# Patient Record
Sex: Male | Born: 1955
Health system: Southern US, Community
[De-identification: ages and names within clinical notes are randomized; demographics above are authoritative.]

## PROBLEM LIST (undated history)

## (undated) DIAGNOSIS — C9010 Plasma cell leukemia not having achieved remission: Secondary | ICD-10-CM

## (undated) DIAGNOSIS — R6510 Systemic inflammatory response syndrome (SIRS) of non-infectious origin without acute organ dysfunction: Secondary | ICD-10-CM

## (undated) DIAGNOSIS — D696 Thrombocytopenia, unspecified: Secondary | ICD-10-CM

## (undated) DIAGNOSIS — B1920 Unspecified viral hepatitis C without hepatic coma: Secondary | ICD-10-CM

## (undated) DIAGNOSIS — J449 Chronic obstructive pulmonary disease, unspecified: Secondary | ICD-10-CM

## (undated) DIAGNOSIS — I10 Essential (primary) hypertension: Secondary | ICD-10-CM

## (undated) DIAGNOSIS — C9000 Multiple myeloma not having achieved remission: Secondary | ICD-10-CM

## (undated) DIAGNOSIS — N179 Acute kidney failure, unspecified: Secondary | ICD-10-CM

## (undated) DIAGNOSIS — Z720 Tobacco use: Secondary | ICD-10-CM

## (undated) DIAGNOSIS — N400 Enlarged prostate without lower urinary tract symptoms: Secondary | ICD-10-CM

## (undated) DIAGNOSIS — C801 Malignant (primary) neoplasm, unspecified: Secondary | ICD-10-CM

## (undated) HISTORY — PX: PENILE PROSTHESIS IMPLANT: SHX240

## (undated) HISTORY — PX: APPENDECTOMY: SHX54

---

## 2001-07-20 ENCOUNTER — Emergency Department (HOSPITAL_COMMUNITY): Admission: EM | Admit: 2001-07-20 | Discharge: 2001-07-20 | Payer: Self-pay | Admitting: Emergency Medicine

## 2008-09-20 ENCOUNTER — Emergency Department (HOSPITAL_COMMUNITY): Admission: EM | Admit: 2008-09-20 | Discharge: 2008-09-20 | Payer: Self-pay | Admitting: Emergency Medicine

## 2009-07-27 ENCOUNTER — Emergency Department (HOSPITAL_COMMUNITY): Admission: EM | Admit: 2009-07-27 | Discharge: 2009-07-27 | Payer: Self-pay | Admitting: Emergency Medicine

## 2009-08-12 ENCOUNTER — Emergency Department (HOSPITAL_COMMUNITY): Admission: EM | Admit: 2009-08-12 | Discharge: 2009-08-12 | Payer: Self-pay | Admitting: Family Medicine

## 2010-11-17 ENCOUNTER — Emergency Department (HOSPITAL_COMMUNITY): Admission: EM | Admit: 2010-11-17 | Discharge: 2010-03-27 | Payer: Self-pay | Admitting: Emergency Medicine

## 2011-09-12 LAB — POCT CARDIAC MARKERS
Myoglobin, poc: 56.6
Troponin i, poc: <0.05

## 2014-04-24 ENCOUNTER — Emergency Department (HOSPITAL_COMMUNITY): Payer: Self-pay

## 2014-04-24 ENCOUNTER — Emergency Department (HOSPITAL_COMMUNITY)
Admission: EM | Admit: 2014-04-24 | Discharge: 2014-04-24 | Disposition: A | Discharge status: Home / Self Care | Payer: Self-pay | Attending: Emergency Medicine | Admitting: Emergency Medicine

## 2014-04-24 ENCOUNTER — Encounter (HOSPITAL_COMMUNITY): Payer: Self-pay | Admitting: Emergency Medicine

## 2014-04-24 DIAGNOSIS — S02609A Fracture of mandible, unspecified, initial encounter for closed fracture: Secondary | ICD-10-CM

## 2014-04-24 DIAGNOSIS — S02610A Fracture of condylar process of mandible, unspecified side, initial encounter for closed fracture: Secondary | ICD-10-CM | POA: Insufficient documentation

## 2014-04-24 DIAGNOSIS — K029 Dental caries, unspecified: Secondary | ICD-10-CM | POA: Insufficient documentation

## 2014-04-24 DIAGNOSIS — F17200 Nicotine dependence, unspecified, uncomplicated: Secondary | ICD-10-CM | POA: Insufficient documentation

## 2014-04-24 MED ORDER — KETOROLAC TROMETHAMINE 15 MG/ML IJ SOLN
15.0000 mg | Freq: Once | INTRAMUSCULAR | Status: AC
  Administered 2014-04-24: 15 mg via INTRAMUSCULAR
  Filled 2014-04-24: qty 1

## 2014-04-24 MED ORDER — OXYCODONE-ACETAMINOPHEN 5-325 MG PO TABS: 1.0000 | ORAL_TABLET | ORAL | Status: DC | PRN

## 2014-04-24 MED ORDER — IBUPROFEN 800 MG PO TABS: 800.0000 mg | ORAL_TABLET | Freq: Three times a day (TID) | ORAL | Status: DC
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
## 2014-04-24 NOTE — Discharge Instructions (Signed)
Call for a follow up appointment with Dr. Benjamine Mola. Call for a follow up appointment with a Family or Primary Care Provider.  Return if Symptoms worsen.   Take ibuprofen 800 mg 3 times a day with food. Take Percocets for breakthrough pain. Drink plenty of fluids. Ice your face and jaw 3-4 times a day. Start a soft diet. Avoid foods that you have to chew.   Emergency Department Resource Guide 1) Find a Doctor and Pay Out of Pocket Although you won't have to find out who is covered by your insurance plan, it is a good idea to ask around and get recommendations. You will then need to call the office and see if the doctor you have chosen will accept you as a new patient and what types of options they offer for patients who are self-pay. Some doctors offer discounts or will set up payment plans for their patients who do not have insurance, but you will need to ask so you aren't surprised when you get to your appointment.  2) Contact Your Local Health Department Not all health departments have doctors that can see patients for sick visits, but many do, so it is worth a call to see if yours does. If you don't know where your local health department is, you can check in your phone book. The CDC also has a tool to help you locate your state's health department, and many state websites also have listings of all of their local health departments.  3) Find a [facility] If your illness is not likely to be very severe or complicated, you may want to try a walk in clinic. These are popping up all over the country in pharmacies, drugstores, and shopping centers. They're usually staffed by nurse practitioners or physician assistants that have been trained to treat common illnesses and complaints. They're usually fairly quick and inexpensive. However, if you have serious medical issues or chronic medical problems, these are probably not your best option.  No Primary Care Doctor: - Call Health Connect at  [phone] -  they can help you locate a primary care doctor that  accepts your insurance, provides certain services, etc. - Physician Referral Service- [phone]  Chronic Pain Problems: Organization         Address  Phone   Notes  [facility]  [phone] Patients need to be referred by their primary care doctor.   Medication Assistance: Organization         Address  Phone   Notes  [facility] Medication [facility] [address] [place], [place] [address] [phone] --Must be a resident of [place] -- Must have NO insurance coverage whatsoever (no Medicaid/ Medicare, etc.) -- The pt. MUST have a primary care doctor that directs their care regularly and follows them in the community   Med[facility]ist  [phone]   [employer]  [phone]    Agencies that provide inexpensive medical care: Organization         Address  Phone   Notes  [place]  [phone]   [facility]    [phone]   [facility] [place], [place] [address] [phone]   [place] Vazquez [phone]   Planned Parenthood    [phone]   [facility]    [phone]   [facility]  [address], [place] Phone:  [phone], Fax:  [phone] Hours of Operation:  9 am - 6 pm, M-F.  Also accepts Medicaid/Medicare and self-pay.  [facility] for [place], [address], [place] Phone: [phone], Fax: [phone]. Hours of Operation:  8:30 am - 5:30 pm, M-F.  Also accepts Medicaid and self-pay.  [facility] [place] [address], [place] Phone: [phone]   [place], [place], [place] [phone], Ext. [phone] Mondays & Thursdays: 7-9 AM.  First 15 patients are seen on a first come, first serve basis.    [place] Providers:  Organization         Address  Phone   Notes  [facility] [address], [place] [phone] Also accepts self-pay patients.  [facility] [id number] [place], [place]  [phone]   [place], [address], [place] [phone]   [facility] [address] [place] [phone]   Lucianne Lei [address], [place]   [phone] Only accepts [place] Access [place] patients after they have their name applied to their card.   Self-Pay (no insurance) in [facility]:  Organization         Address  Phone   Notes  Sickle Cell Patients, [facility] [place] [phone]   [facility] [place] [phone]   [facility] [place]  [place], [place], [place] [phone]   [facility]/Dr. Osei-Bonsu  [address], [place] or [address], [place] [phone] Phone number for both [place] and [place] locations is the same.  [facility]gent [facility] and [facility] [place] [address] [place] [phone]   [facility] [address], [place] or [address] [phone]   [facility] [address] [place] [phone], phone; [phone], fax Sees patients 1st and 3rd Saturday of every month.  Must not qualify for public or private insurance (i.e. Medicaid, Medicare, [place] Health Choice, Veterans' Benefits)  Household income should be no more than 200% of the poverty level The clinic cannot treat you if you are pregnant or think you are pregnant  Sexually transmitted diseases are not treated at the clinic.    Dental Care: Organization         Address  Phone  Notes  [facility] Department of [facility] [place] [phone] Accepts children up to age 37 who are enrolled in [place] or [place]; pregnant women with a Medicaid card; and children who have applied for Medicaid or [place] Health Choice, but were declined, whose parents can pay a reduced fee at time of service.  [facility] Department of [facility]  [address], [place] [phone] Accepts children up to age 62 who are enrolled in [place] or [place]; pregnant women with a Medicaid card; and children who have applied for Medicaid or [place] Health Choice, but were declined, whose parents can pay a reduced fee at time of service.  [facility] Adult Dental Access PROGRAM  [place], [place] [phone]  Patients are seen by appointment only. Walk-ins are not accepted. [place] will see patients 59 years of age and older. Monday - Tuesday (8am-5pm) Most Wednesdays (8:30-5pm) $30 per visit, cash only  [facility] Adult Dental Access PROGRAM  [address], [facility] [phone] Patients are seen by appointment only. Walk-ins are not accepted. [place] will see patients 25 years of age and older. One Wednesday Evening (Monthly: Volunteer Based).  $30 per visit, cash only  [place]  [phone] for adults; Children under age 71, call Graduate Pediatric Dentistry at [phone]. Children aged 9-14, please call [phone] to request a pediatric application.  Dental services are provided in all areas of dental care including fillings, crowns and bridges, complete and partial dentures, implants, gum treatment, root canals, and extractions. Preventive care is also provided. Treatment is provided to both adults and children. Patients are selected via a lottery and there is often a waiting list.   [facility] [address], [place]  [phone] [email or web address]   [facility]  [facility] [address] [place], [place] [phone], Ext. [phone] Second and Fourth Thursday of each month, opens at 6:30 AM; Clinic ends at 9 AM.  Patients are seen on a first-come first-served basis, and a limited number are seen during each clinic.   [facility]  [address] Hillard Danker [place], [place] [phone]   Eligibility Requirements You must have lived in [place], [place], or [place] counties for at least the last three months.   You cannot be eligible for state or federal sponsored [employer], including [employer], [place], or [employer].   You generally cannot be eligible for healthcare insurance through your employer.    How to apply: Eligibility screenings are held every Tuesday and Wednesday afternoon from 1:00 pm until 4:00 pm. You do not need an appointment for the interview!  [facility] [address], [place], [place]   [place]  [place] Department  [place]  [phone]    Behavioral Health Resources in the Community: Intensive Outpatient Programs Organization         Address  Phone  Notes  [place]. [address], [place], [place] [phone]   [facility] Outpatient [address] [place], [place]   ADS: Alcohol & Drug Svcs [address] [place], [place]   [place] [address]  [place], [place] or [phone]   Substance Abuse Resources Organization         Address  Phone  Notes  Alcohol and Drug Services  [phone]   [place]  [phone]   [facility] [place]   Chinita Pester  [phone]   Residential & Outpatient Substance Abuse Program  [phone]   Psychological Services Organization         Address  Phone  Notes  [facility] [place]   [place]  [phone]   [place] [address], [place] or [phone]    Mobile Crisis Teams Organization         Address  Phone  Notes  [facility], Mobile Crisis Care Unit  [phone]   Assertive Psychotherapeutic Services  [address] [place], [place]   [facility] [address], [place]  [place] [phone]    Self-Help/Support Groups Organization         Address  Phone             Notes  Mental Health Assoc. of [place] - variety of support groups  [place] Call for more information  Narcotics Anonymous (NA), [facility]aring [facility] [address], [employer] [place]  2 meetings at this location   Special educational needs teacher         Address  Phone  Notes  [facility]idential [facility] [place],    [place]  [phone]   [facility]  [address], [place] [id number], [place], [place]   [place], [place] [phone] Admissions: 8am-3pm M-F  Incentives Substance [place] [address],    [place], [place] [phone]   [facility] [address] [place], [place], [place]   [facility] [address],  [place], [place]   [facility] Programs - Intensive Outpatient [address] Kristeen [address], [place], [place]   [facility] ([place].,  [place], [place] [phone] or [phone]   [facility]idential Treatment Services (RTS) [address], [place], [place] Accepts Medicaid  [facility] [place] [address],  [place] [phone] Substance Abuse/Addiction Treatment   [facility] Organization         Address  Phone  Notes  [facility]  [phone]   Domenic Schwab, PhD [address] Arlis Porta [place], [place]   [phone] or  [phone]   [facility] [place], [place] [phone]   [facility] [address], [place], [place] [phone] Insurance/Medicaid/sponsorship through [facility] and Families [address], [address] [place]                                    [place], [place] [phone] [place] [address], [place] [phone]    Dr. Adele Schilder  [phone]   [facility] Dept. 1) [address], [place] 2) [place] 3)  [place] [address], [place] [phone]  [phone]   [place] [phone] or [phone] (After Hours)
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
## 2014-04-26 NOTE — ED Provider Notes (Signed)
History/physical exam/procedure(s) were performed by non-physician practitioner and as supervising physician I was immediately available for consultation/collaboration. I have reviewed all notes and am in agreement with care and plan.   Shaune Pollack, MD 04/26/14 [phone]

## 2015-04-13 ENCOUNTER — Emergency Department (HOSPITAL_COMMUNITY): Payer: No Typology Code available for payment source

## 2015-04-13 ENCOUNTER — Emergency Department (HOSPITAL_COMMUNITY)
Admission: EM | Admit: 2015-04-13 | Discharge: 2015-04-13 | Disposition: A | Discharge status: Home / Self Care | Payer: No Typology Code available for payment source | Attending: Emergency Medicine | Admitting: Emergency Medicine

## 2015-04-13 ENCOUNTER — Encounter (HOSPITAL_COMMUNITY): Payer: Self-pay | Admitting: Emergency Medicine

## 2015-04-13 DIAGNOSIS — S299XXA Unspecified injury of thorax, initial encounter: Secondary | ICD-10-CM | POA: Diagnosis present

## 2015-04-13 DIAGNOSIS — Z720 Tobacco use: Secondary | ICD-10-CM | POA: Insufficient documentation

## 2015-04-13 DIAGNOSIS — Y9389 Activity, other specified: Secondary | ICD-10-CM | POA: Diagnosis not present

## 2015-04-13 DIAGNOSIS — Y92410 Unspecified street and highway as the place of occurrence of the external cause: Secondary | ICD-10-CM | POA: Diagnosis not present

## 2015-04-13 DIAGNOSIS — Y998 Other external cause status: Secondary | ICD-10-CM | POA: Diagnosis not present

## 2015-04-13 DIAGNOSIS — S20212A Contusion of left front wall of thorax, initial encounter: Secondary | ICD-10-CM | POA: Diagnosis not present

## 2015-04-13 LAB — URINALYSIS, ROUTINE W REFLEX MICROSCOPIC
BILIRUBIN URINE: NEGATIVE
Glucose, UA: NEGATIVE mg/dL
HGB URINE DIPSTICK: NEGATIVE
KETONES UR: NEGATIVE mg/dL
NITRITE: NEGATIVE
PROTEIN: NEGATIVE mg/dL
Specific Gravity, Urine: 1.024 (ref 1.005–1.030)
UROBILINOGEN UA: 0.2 mg/dL (ref 0.0–1.0)
pH: 5.5 (ref 5.0–8.0)

## 2015-04-13 LAB — URINE MICROSCOPIC-ADD ON

## 2015-04-13 MED ORDER — NAPROXEN 500 MG PO TABS: 500.0000 mg | ORAL_TABLET | Freq: Two times a day (BID) | ORAL | Status: DC
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
## 2015-04-13 NOTE — Discharge Instructions (Signed)
Apply ice to the side where the pain is. If take naproxen as prescribed as needed for pain. Follow-up with your doctor..   Chest Contusion A chest contusion is a deep bruise on your chest area. Contusions are the result of an injury that caused bleeding under the skin. A chest contusion may involve bruising of the skin, muscles, or ribs. The contusion may turn blue, purple, or yellow. Minor injuries will give you a painless contusion, but more severe contusions may stay painful and swollen for a few weeks. CAUSES  A contusion is usually caused by a blow, trauma, or direct force to an area of the body. SYMPTOMS   Swelling and redness of the injured area.  Discoloration of the injured area.  Tenderness and soreness of the injured area.  Pain. DIAGNOSIS  The diagnosis can be made by taking a history and performing a physical exam. An X-ray, CT scan, or MRI may be needed to determine if there were any associated injuries, such as broken bones (fractures) or internal injuries. TREATMENT  Often, the best treatment for a chest contusion is resting, icing, and applying cold compresses to the injured area. Deep breathing exercises may be recommended to reduce the risk of pneumonia. Over-the-counter medicines may also be recommended for pain control. HOME CARE INSTRUCTIONS   Put ice on the injured area.  Put ice in a plastic bag.  Place a towel between your skin and the bag.  Leave the ice on for 15-20 minutes, 03-04 times a day.  Only take over-the-counter or prescription medicines as directed by your caregiver. Your caregiver may recommend avoiding anti-inflammatory medicines (aspirin, ibuprofen, and naproxen) for 48 hours because these medicines may increase bruising.  Rest the injured area.  Perform deep-breathing exercises as directed by your caregiver.  Stop smoking if you smoke.  Do not lift objects over 5 pounds (2.3 kg) for 3 days or longer if recommended by your caregiver. SEEK  IMMEDIATE MEDICAL CARE IF:   You have increased bruising or swelling.  You have pain that is getting worse.  You have difficulty breathing.  You have dizziness, weakness, or fainting.  You have blood in your urine or stool.  You cough up or vomit blood.  Your swelling or pain is not relieved with medicines. MAKE SURE YOU:   Understand these instructions.  Will watch your condition.  Will get help right away if you are not doing well or get worse. Document Released: 08/22/2001 Document Revised: 08/21/2012 Document Reviewed: 05/20/2012 [facility] Patient Information 2015 [place], [place]. This information is not intended to replace advice given to you by your health care provider. Make sure you discuss any questions you have with your health care provider.

## 2017-11-17 ENCOUNTER — Inpatient Hospital Stay (HOSPITAL_COMMUNITY): Payer: BLUE CROSS/BLUE SHIELD

## 2017-11-17 ENCOUNTER — Inpatient Hospital Stay (HOSPITAL_COMMUNITY)
Admission: EM | Admit: 2017-11-17 | Discharge: 2017-11-22 | DRG: 824 | Disposition: A | Discharge status: Home Health | Payer: BLUE CROSS/BLUE SHIELD | Attending: Family Medicine | Admitting: Family Medicine

## 2017-11-17 ENCOUNTER — Other Ambulatory Visit: Payer: Self-pay

## 2017-11-17 ENCOUNTER — Encounter (HOSPITAL_COMMUNITY): Payer: Self-pay

## 2017-11-17 ENCOUNTER — Emergency Department (HOSPITAL_COMMUNITY): Payer: BLUE CROSS/BLUE SHIELD

## 2017-11-17 DIAGNOSIS — E871 Hypo-osmolality and hyponatremia: Secondary | ICD-10-CM | POA: Diagnosis present

## 2017-11-17 DIAGNOSIS — R030 Elevated blood-pressure reading, without diagnosis of hypertension: Secondary | ICD-10-CM | POA: Diagnosis present

## 2017-11-17 DIAGNOSIS — Z720 Tobacco use: Secondary | ICD-10-CM | POA: Diagnosis present

## 2017-11-17 DIAGNOSIS — R202 Paresthesia of skin: Secondary | ICD-10-CM | POA: Diagnosis present

## 2017-11-17 DIAGNOSIS — C9000 Multiple myeloma not having achieved remission: Secondary | ICD-10-CM | POA: Diagnosis present

## 2017-11-17 DIAGNOSIS — R944 Abnormal results of kidney function studies: Secondary | ICD-10-CM

## 2017-11-17 DIAGNOSIS — D649 Anemia, unspecified: Secondary | ICD-10-CM | POA: Diagnosis present

## 2017-11-17 DIAGNOSIS — M898X9 Other specified disorders of bone, unspecified site: Secondary | ICD-10-CM | POA: Diagnosis present

## 2017-11-17 DIAGNOSIS — R630 Anorexia: Secondary | ICD-10-CM | POA: Diagnosis present

## 2017-11-17 DIAGNOSIS — M899 Disorder of bone, unspecified: Secondary | ICD-10-CM

## 2017-11-17 DIAGNOSIS — Z6823 Body mass index (BMI) 23.0-23.9, adult: Secondary | ICD-10-CM

## 2017-11-17 DIAGNOSIS — I10 Essential (primary) hypertension: Secondary | ICD-10-CM | POA: Diagnosis present

## 2017-11-17 DIAGNOSIS — M84451A Pathological fracture, right femur, initial encounter for fracture: Secondary | ICD-10-CM | POA: Diagnosis present

## 2017-11-17 DIAGNOSIS — D72820 Lymphocytosis (symptomatic): Secondary | ICD-10-CM | POA: Diagnosis not present

## 2017-11-17 DIAGNOSIS — I361 Nonrheumatic tricuspid (valve) insufficiency: Secondary | ICD-10-CM | POA: Diagnosis not present

## 2017-11-17 DIAGNOSIS — N179 Acute kidney failure, unspecified: Secondary | ICD-10-CM | POA: Diagnosis present

## 2017-11-17 DIAGNOSIS — M898X5 Other specified disorders of bone, thigh: Secondary | ICD-10-CM | POA: Diagnosis present

## 2017-11-17 DIAGNOSIS — K5900 Constipation, unspecified: Secondary | ICD-10-CM | POA: Diagnosis present

## 2017-11-17 DIAGNOSIS — F17210 Nicotine dependence, cigarettes, uncomplicated: Secondary | ICD-10-CM | POA: Diagnosis present

## 2017-11-17 DIAGNOSIS — Z419 Encounter for procedure for purposes other than remedying health state, unspecified: Secondary | ICD-10-CM

## 2017-11-17 DIAGNOSIS — N400 Enlarged prostate without lower urinary tract symptoms: Secondary | ICD-10-CM | POA: Diagnosis present

## 2017-11-17 DIAGNOSIS — E872 Acidosis: Secondary | ICD-10-CM | POA: Diagnosis present

## 2017-11-17 HISTORY — DX: Benign prostatic hyperplasia without lower urinary tract symptoms: N40.0

## 2017-11-17 HISTORY — DX: Tobacco use: Z72.0

## 2017-11-17 LAB — URINALYSIS, ROUTINE W REFLEX MICROSCOPIC
BILIRUBIN URINE: NEGATIVE
Glucose, UA: NEGATIVE mg/dL
KETONES UR: NEGATIVE mg/dL
LEUKOCYTES UA: NEGATIVE
NITRITE: NEGATIVE
PROTEIN: NEGATIVE mg/dL
Specific Gravity, Urine: 1.005 (ref 1.005–1.030)
Squamous Epithelial / LPF: NONE SEEN
pH: 7 (ref 5.0–8.0)

## 2017-11-17 LAB — DIFFERENTIAL
BLASTS: 0 %
Band Neutrophils: 0 %
Basophils Absolute: 0 10*3/uL (ref 0.0–0.1)
Basophils Relative: 0 %
EOS PCT: 0 %
Eosinophils Absolute: 0 10*3/uL (ref 0.0–0.7)
LYMPHS ABS: 28.7 10*3/uL — AB (ref 0.7–4.0)
LYMPHS PCT: 79 %
METAMYELOCYTES PCT: 0 %
MONOS PCT: 3 %
Monocytes Absolute: 1.1 10*3/uL — ABNORMAL HIGH (ref 0.1–1.0)
Myelocytes: 0 %
NEUTROS ABS: 6.6 10*3/uL (ref 1.7–7.7)
NEUTROS PCT: 18 %
Promyelocytes Absolute: 0 %
nRBC: 0 /100 WBC

## 2017-11-17 LAB — COMPREHENSIVE METABOLIC PANEL
ALBUMIN: 3.9 g/dL (ref 3.5–5.0)
ALK PHOS: 78 U/L (ref 38–126)
ALT: 31 U/L (ref 17–63)
AST: 41 U/L (ref 15–41)
Anion gap: 13 (ref 5–15)
BILIRUBIN TOTAL: 0.5 mg/dL (ref 0.3–1.2)
BUN: 43 mg/dL — AB (ref 6–20)
CALCIUM: 14 mg/dL — AB (ref 8.9–10.3)
CO2: 23 mmol/L (ref 22–32)
Chloride: 100 mmol/L — ABNORMAL LOW (ref 101–111)
Creatinine, Ser: 6.31 mg/dL — ABNORMAL HIGH (ref 0.61–1.24)
GFR calc Af Amer: 10 mL/min — ABNORMAL LOW (ref >60)
GFR, EST NON AFRICAN AMERICAN: 9 mL/min — AB (ref >60)
GLUCOSE: 107 mg/dL — AB (ref 65–99)
Potassium: 4.5 mmol/L (ref 3.5–5.1)
Sodium: 136 mmol/L (ref 135–145)
TOTAL PROTEIN: 7.3 g/dL (ref 6.5–8.1)

## 2017-11-17 LAB — MAGNESIUM: MAGNESIUM: 2.7 mg/dL — AB (ref 1.7–2.4)

## 2017-11-17 LAB — TSH: TSH: 1.874 u[IU]/mL (ref 0.350–4.500)

## 2017-11-17 LAB — CBC
HCT: 31.1 % — ABNORMAL LOW (ref 39.0–52.0)
HEMOGLOBIN: 10.9 g/dL — AB (ref 13.0–17.0)
MCH: 31.3 pg (ref 26.0–34.0)
MCHC: 35 g/dL (ref 30.0–36.0)
MCV: 89.4 fL (ref 78.0–100.0)
Platelets: 210 10*3/uL (ref 150–400)
RBC: 3.48 MIL/uL — AB (ref 4.22–5.81)
RDW: 13.8 % (ref 11.5–15.5)
WBC: 36.4 10*3/uL — ABNORMAL HIGH (ref 4.0–10.5)

## 2017-11-17 LAB — PROTIME-INR
INR: 0.99
Prothrombin Time: 13 seconds (ref 11.4–15.2)

## 2017-11-17 LAB — I-STAT CHEM 8, ED
BUN: 40 mg/dL — AB (ref 6–20)
CHLORIDE: 104 mmol/L (ref 101–111)
CREATININE: 6.3 mg/dL — AB (ref 0.61–1.24)
Calcium, Ion: 1.62 mmol/L (ref 1.15–1.40)
Glucose, Bld: 106 mg/dL — ABNORMAL HIGH (ref 65–99)
HEMATOCRIT: 30 % — AB (ref 39.0–52.0)
Hemoglobin: 10.2 g/dL — ABNORMAL LOW (ref 13.0–17.0)
POTASSIUM: 4.4 mmol/L (ref 3.5–5.1)
Sodium: 136 mmol/L (ref 135–145)
TCO2: 24 mmol/L (ref 22–32)

## 2017-11-17 LAB — CBG MONITORING, ED: Glucose-Capillary: 103 mg/dL — ABNORMAL HIGH (ref 65–99)

## 2017-11-17 LAB — PHOSPHORUS: Phosphorus: 4.8 mg/dL — ABNORMAL HIGH (ref 2.5–4.6)

## 2017-11-17 LAB — APTT: APTT: 24 s (ref 24–36)

## 2017-11-17 LAB — PSA: Prostatic Specific Antigen: 0.83 ng/mL (ref 0.00–4.00)

## 2017-11-17 LAB — I-STAT TROPONIN, ED: TROPONIN I, POC: 0.03 ng/mL (ref 0.00–0.08)

## 2017-11-17 LAB — SAVE SMEAR

## 2017-11-17 MED ORDER — OXYCODONE HCL 5 MG PO TABS
5.0000 mg | ORAL_TABLET | ORAL | Status: DC | PRN
  Administered 2017-11-20 – 2017-11-21 (×4): 5 mg via ORAL
  Filled 2017-11-17 (×6): qty 1

## 2017-11-17 MED ORDER — ACETAMINOPHEN 650 MG RE SUPP: 650.0000 mg | Freq: Four times a day (QID) | RECTAL | Status: DC | PRN

## 2017-11-17 MED ORDER — MORPHINE SULFATE (PF) 4 MG/ML IV SOLN
1.0000 mg | INTRAVENOUS | Status: DC | PRN
  Administered 2017-11-21 – 2017-11-22 (×5): 2 mg via INTRAVENOUS
  Filled 2017-11-17 (×5): qty 1

## 2017-11-17 MED ORDER — ACETAMINOPHEN 325 MG PO TABS
650.0000 mg | ORAL_TABLET | Freq: Four times a day (QID) | ORAL | Status: DC | PRN
  Administered 2017-11-17 – 2017-11-22 (×7): 650 mg via ORAL
  Filled 2017-11-17 (×7): qty 2

## 2017-11-17 MED ORDER — ONDANSETRON HCL 4 MG PO TABS: 4.0000 mg | ORAL_TABLET | Freq: Four times a day (QID) | ORAL | Status: DC | PRN

## 2017-11-17 MED ORDER — SODIUM CHLORIDE 0.9 % IV SOLN
30.0000 mg | Freq: Once | INTRAVENOUS | Status: DC
  Filled 2017-11-17: qty 10

## 2017-11-17 MED ORDER — DEXAMETHASONE SODIUM PHOSPHATE 10 MG/ML IJ SOLN
10.0000 mg | Freq: Four times a day (QID) | INTRAMUSCULAR | Status: DC
  Administered 2017-11-17 – 2017-11-21 (×17): 10 mg via INTRAVENOUS
  Filled 2017-11-17 (×16): qty 1

## 2017-11-17 MED ORDER — CALCITONIN (SALMON) 200 UNIT/ML IJ SOLN
4.0000 [IU]/kg | Freq: Two times a day (BID) | INTRAMUSCULAR | Status: AC
Start: 2017-11-17 — End: 2017-11-19
  Administered 2017-11-17 – 2017-11-19 (×4): 284 [IU] via SUBCUTANEOUS
  Filled 2017-11-17 (×5): qty 1.42

## 2017-11-17 MED ORDER — HYDRALAZINE HCL 20 MG/ML IJ SOLN: 10.0000 mg | Freq: Four times a day (QID) | INTRAMUSCULAR | Status: DC | PRN

## 2017-11-17 MED ORDER — SODIUM CHLORIDE 0.9 % IV BOLUS (SEPSIS)
2000.0000 mL | Freq: Once | INTRAVENOUS | Status: AC
  Administered 2017-11-17: 2000 mL via INTRAVENOUS

## 2017-11-17 MED ORDER — SODIUM CHLORIDE 0.9 % IV SOLN: INTRAVENOUS | Status: DC

## 2017-11-17 MED ORDER — FUROSEMIDE 10 MG/ML IJ SOLN
80.0000 mg | Freq: Once | INTRAMUSCULAR | Status: AC
  Administered 2017-11-17: 80 mg via INTRAVENOUS

## 2017-11-17 MED ORDER — FUROSEMIDE 10 MG/ML IJ SOLN
INTRAMUSCULAR | Status: AC
Start: 2017-11-17 — End: 2017-11-17
  Filled 2017-11-17: qty 8

## 2017-11-17 MED ORDER — HEPARIN SODIUM (PORCINE) 5000 UNIT/ML IJ SOLN
5000.0000 [IU] | Freq: Three times a day (TID) | INTRAMUSCULAR | Status: DC
  Administered 2017-11-17 – 2017-11-22 (×9): 5000 [IU] via SUBCUTANEOUS
  Filled 2017-11-17 (×9): qty 1

## 2017-11-17 MED ORDER — SODIUM CHLORIDE 0.9% FLUSH
3.0000 mL | Freq: Two times a day (BID) | INTRAVENOUS | Status: DC
  Administered 2017-11-17 – 2017-11-21 (×3): 3 mL via INTRAVENOUS

## 2017-11-17 MED ORDER — ONDANSETRON HCL 4 MG/2ML IJ SOLN
4.0000 mg | Freq: Four times a day (QID) | INTRAMUSCULAR | Status: DC | PRN
  Administered 2017-11-17: 4 mg via INTRAVENOUS
  Filled 2017-11-17: qty 2

## 2017-11-17 NOTE — Consult Note (Signed)
New Hematology/Oncology Consult   Referral MD: Dr Lady Deutscher  Reason for Referral: Severe hypercalcemia, acute kidney injury, bone pain, lymphocytic leukocytosis  HPI:  Andres Fernandez is a 61 y.o. male with minimal past medical history.  He presented to the emergency room today complaining of numbness and pain in the right arm and bilateral thighs with associated weakness over the past week.  Also complaining of constipation, nausea, and vomiting x2.  Patient denied interval fevers, chills, night sweats.  Has indicated significant weight loss with at least 15 pounds over the past several weeks.  Appetite has been diminished, and patient has been forcing himself to eat more food than he would normally feel like to.  CT of the head without contrast obtained for possible stroke demonstrate no evidence of intracranial process, but showed diffuse osseous and calvarial lytic lesions consistent with possible multiple myeloma.  Additional lab work demonstrated white blood cell count of 36.4, with absolute lymphocyte count of 28.7, hemoglobin of 10.9 and platelets of 210.  Patient's protein level was 7.3 with albumin of 3.9, calcium of 14.0 and creatinine of 6.3.  No baseline lab values available for creatinine.  At this time, patient's main complaint is reported pain in the right upper and right lower extremity.  The patient denies any interval fever, chills or night sweats.  At this time, does not have any nausea and is eating his breakfast.  No abdominal pain.  Denies melena or hematochezia.  No dysuria or hematuria.  No sensory deficits in his extremities.  History reviewed. No pertinent past medical history.:  History reviewed. No pertinent surgical history.:   Current Facility-Administered Medications:  .  0.9 %  sodium chloride infusion, , Intravenous, Continuous, Samella Parr, NP .  acetaminophen (TYLENOL) tablet 650 mg, 650 mg, Oral, Q6H PRN **OR** acetaminophen (TYLENOL) suppository  650 mg, 650 mg, Rectal, Q6H PRN, Samella Parr, NP .  dexamethasone (DECADRON) injection 10 mg, 10 mg, Intravenous, Q6H, Samella Parr, NP .  furosemide (LASIX) 10 MG/ML injection, , , ,  .  heparin injection 5,000 Units, 5,000 Units, Subcutaneous, Q8H, Erin Hearing L, NP .  ondansetron (ZOFRAN) tablet 4 mg, 4 mg, Oral, Q6H PRN **OR** ondansetron (ZOFRAN) injection 4 mg, 4 mg, Intravenous, Q6H PRN, Samella Parr, NP .  pamidronate (AREDIA) 30 mg in sodium chloride 0.9 % 500 mL IVPB, 30 mg, Intravenous, Once, Erin Hearing L, NP .  sodium chloride flush (NS) 0.9 % injection 3 mL, 3 mL, Intravenous, Q12H, Samella Parr, NP  Current Outpatient Medications:  .  ibuprofen (ADVIL,MOTRIN) 200 MG tablet, Take 400 mg by mouth every 6 (six) hours as needed for mild pain., Disp: , Rfl:  .  Multiple Vitamin (MULTIVITAMIN WITH MINERALS) TABS tablet, Take 1 tablet by mouth daily., Disp: , Rfl: :  . dexamethasone  10 mg Intravenous Q6H  . furosemide      . heparin  5,000 Units Subcutaneous Q8H  . sodium chloride flush  3 mL Intravenous Q12H  :  No Known Allergies:   Family History  Problem Relation Age of Onset  . COPD Mother   . Liver cancer Mother    Social History   Socioeconomic History  . Marital status: Single    Spouse name: Not on file  . Number of children: Not on file  . Years of education: Not on file  . Highest education level: Not on file  Social Needs  . Financial resource strain: Not  on file  . Food insecurity - worry: Not on file  . Food insecurity - inability: Not on file  . Transportation needs - medical: Not on file  . Transportation needs - non-medical: Not on file  Occupational History  . Not on file  Tobacco Use  . Smoking status: Current Every Day Smoker  . Smokeless tobacco: Never Used  Substance and Sexual Activity  . Alcohol use: Yes  . Drug use: No  . Sexual activity: Not on file  Other Topics Concern  . Not on file  Social History  Narrative  . Not on file    Review of Systems: As per HPI.  All other systems are negative   Physical Exam:  Blood pressure (!) 144/106, pulse 87, temperature 98.4 F (36.9 C), temperature source Oral, resp. rate 19, height _0  (1.727 m), weight 157 lb (71.2 kg), SpO2 100 %.  Fatigued, awake, alert, oriented x3. HEENT: Anicteric sclerae, dry mucous membranes Lungs: Clear to auscultation bilaterally Cardiac: Regular, S1/S2, no murmurs Abdomen: Mildly distended, nontender Vascular: No lower extremity edema Lymph nodes: No palpable lymphadenopathy. Neurologic: No gross focal neurological deficit but strength and sensation Skin: Unremarkable Musculoskeletal: Significant bitemporal muscle wasting  LABS:  Recent Labs    11/17/17 0415 11/17/17 0429  WBC 36.4*  --   HGB 10.9* 10.2*  HCT 31.1* 30.0*  PLT 210  --     Recent Labs    11/17/17 0415 11/17/17 0429  NA 136 136  K 4.5 4.4  CL 100* 104  CO2 23  --   GLUCOSE 107* 106*  BUN 43* 40*  CREATININE 6.31* 6.30*  CALCIUM 14.0*  --       RADIOLOGY:  Dg Chest 2 View  Result Date: 11/17/2017 CLINICAL DATA:  61 year old male with chest pain. EXAM: CHEST  2 VIEW COMPARISON:  Chest radiograph dated 05/10/2015 FINDINGS: Mild right lung base atelectatic changes/ scarring. There is no focal consolidation, pleural effusion, or pneumothorax. The cardiac silhouette is within normal limits. There is mild degenerative changes of the spine. No acute osseous pathology. IMPRESSION: No active cardiopulmonary disease. Electronically Signed   By: Anner Crete M.D.   On: 11/17/2017 07:08   Ct Head Wo Contrast  Result Date: 11/17/2017 CLINICAL DATA:  61 year old male with numbness and tingling. Paraplegia. EXAM: CT HEAD WITHOUT CONTRAST TECHNIQUE: Contiguous axial images were obtained from the base of the skull through the vertex without intravenous contrast. COMPARISON:  Facial bone CT dated 03/27/2010 FINDINGS: Brain: The  ventricles and sulci appropriate size for patient's age. Minimal periventricular and deep white matter chronic microvascular ischemic changes noted. There is no acute intracranial hemorrhage. No mass effect or midline shift noted. No extra-axial fluid collection. Vascular: There is slight high attenuation of the right MCA in the sylvian fissure (series 7 image 12). This may be chronic. If there is clinical concern for right MCA territory ischemia further evaluation with CT angiography of the head or MRI is recommended. Skull: There are innumerable lucent calvarial lesions concerning for multiple myeloma or metastatic disease. Correlation with history of known malignancy recommended. No acute fracture. Sinuses/Orbits: There is complete opacification of the visualized right maxillary sinus. Mild mucoperiosteal thickening of paranasal sinuses. There is a lytic lesion involving the posterior right mastoid versus effusion. Other: None IMPRESSION: 1. No acute intracranial hemorrhage. 2. Slightly high attenuation of the right MCA in the right sylvian fissure which may be chronic. Further evaluation with CTA or MRI is recommended if there is high  clinical concern for acute right MCA territory ischemia. 3. 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 Correlation with history of malignancy recommended. Electronically Signed   By: Anner Crete M.D.   On: 11/17/2017 06:05   US Renal  Result Date: 11/17/2017 CLINICAL DATA:  Acute renal injury EXAM: RENAL / URINARY TRACT ULTRASOUND COMPLETE COMPARISON:  None. FINDINGS: Right Kidney: Length: 10.3 cm. Increased cortical echogenicity. No hydronephrosis. Than renal cortex. Left Kidney: Length: 10.2 cm. Increased cortical echogenicity. No hydronephrosis. Thinned renal cortex. Bladder: Appears normal for degree of bladder distention. IMPRESSION: 1. No hydronephrosis. 2. Increased cortical echogenicity and thinned renal cortex suggesting medical renal disease .  Electronically Signed   By: Suzy Bouchard M.D.   On: 11/17/2017 08:32    Assessment and Plan:  61 y.o.-year-old male presenting with severe hypercalcemia, skeletal findings consistent with prostatic disease and significant leukocytosis in the peripheral blood.  Differential diagnosis includes a plasma cell disorder such as multiple myeloma or plasma cell leukemia, lymphoplasmacytic lymphoma, chronic lymphocytic leukemia.   **Hypercalcemia: Very elevated calcium elevation due to decreased mobility of the patient and destructive lytic lesions throughout the skeletal structures --Skeletal survey. If significant lytic lesions are present in the femoral bones especially in the femoral necks, consider obtaining orthopedic consult for possible prophylactic internal fixation --Aggressive hydration with at least 100 mL of normal saline per hour --Pamidronate or, due to significant elevation of creatinine, use calcitonin at this time --Monitor calcium level at least daily  **Lymphocytosis/Skeletal lytic lesions: Multiple myeloma is high signal in the differential at this time --Peripheral blood flow cytometry --SPEP/IFE/quantitative immunoglobulins/serum free light chains, 24-hour urine collection for urinary protein electrophoresis; LDH, beta-2 microglobulin,  --Ordering interventional radiology for bone marrow biopsy on Monday --Further management will depend on the of the evaluation and hypercalcemia recovery   Ardath Sax, MD 11/17/2017, 8:48 AM

## 2017-11-17 NOTE — ED Notes (Signed)
Patient transported to X-ray 

## 2017-11-17 NOTE — Consult Note (Signed)
ORTHOPAEDIC CONSULTATION  REQUESTING PHYSICIAN: Lady Deutscher, MD  PCP:  Patient, No Pcp Per  Chief Complaint: Right leg pain  HPI: Andres Fernandez is a 61 y.o. male who complains of 2 weeks of increasing total body pain really but specifically from an orthopedic standpoint increasing right thigh pain over the last 2 weeks.  He was admitted for this increasing pain and found to have multiple lytic lesions throughout his skeletal system.  This is likely multiple myeloma.  He has a tissue diagnosis forthcoming.  Orthopedic surgery was consulted to assess for the need of any prophylactic fixation of his lytic lesions in bilateral femurs.  On arrival to his room today he was walking the halls unaided with no walker or assistive devices.  He states that he has pain in the right mid thigh after weightbearing.  He denies any pain in the left leg.  Past Medical History:  Diagnosis Date  . BPH (benign prostatic hyperplasia)   . Tobacco abuse    Past Surgical History:  Procedure Laterality Date  . APPENDECTOMY     Social History   Socioeconomic History  . Marital status: Single    Spouse name: None  . Number of children: None  . Years of education: None  . Highest education level: None  Social Needs  . Financial resource strain: None  . Food insecurity - worry: None  . Food insecurity - inability: None  . Transportation needs - medical: None  . Transportation needs - non-medical: None  Occupational History  . None  Tobacco Use  . Smoking status: Current Every Day Smoker  . Smokeless tobacco: Never Used  Substance and Sexual Activity  . Alcohol use: Yes  . Drug use: No  . Sexual activity: None  Other Topics Concern  . None  Social History Narrative  . None   Family History  Problem Relation Age of Onset  . COPD Mother   . Liver cancer Mother    No Known Allergies Prior to Admission medications   Medication Sig Start Date End Date Taking? Authorizing Provider    ibuprofen (ADVIL,MOTRIN) 200 MG tablet Take 400 mg by mouth every 6 (six) hours as needed for mild pain.   Yes [provider]  Multiple Vitamin (MULTIVITAMIN WITH MINERALS) TABS tablet Take 1 tablet by mouth daily.   Yes [provider]   Dg Chest 2 View  Result Date: 11/17/2017 CLINICAL DATA:  61 year old male with chest pain. EXAM: CHEST  2 VIEW COMPARISON:  Chest radiograph dated 05/10/2015 FINDINGS: Mild right lung base atelectatic changes/ scarring. There is no focal consolidation, pleural effusion, or pneumothorax. The cardiac silhouette is within normal limits. There is mild degenerative changes of the spine. No acute osseous pathology. IMPRESSION: No active cardiopulmonary disease. Electronically Signed   By: Anner Crete M.D.   On: 11/17/2017 07:08   Ct Head Wo Contrast  Result Date: 11/17/2017 CLINICAL DATA:  61 year old male with numbness and tingling. Paraplegia. EXAM: CT HEAD WITHOUT CONTRAST TECHNIQUE: Contiguous axial images were obtained from the base of the skull through the vertex without intravenous contrast. COMPARISON:  Facial bone CT dated 03/27/2010 FINDINGS: Brain: The ventricles and sulci appropriate size for patient's age. Minimal periventricular and deep white matter chronic microvascular ischemic changes noted. There is no acute intracranial hemorrhage. No mass effect or midline shift noted. No extra-axial fluid collection. Vascular: There is slight high attenuation of the right MCA in the sylvian fissure (series 7 image 12). This may  be chronic. If there is clinical concern for right MCA territory ischemia further evaluation with CT angiography of the head or MRI is recommended. Skull: There are innumerable lucent calvarial lesions concerning for multiple myeloma or metastatic disease. Correlation with history of known malignancy recommended. No acute fracture. Sinuses/Orbits: There is complete opacification of the visualized right maxillary sinus. Mild  mucoperiosteal thickening of paranasal sinuses. There is a lytic lesion involving the posterior right mastoid versus effusion. Other: None IMPRESSION: 1. No acute intracranial hemorrhage. 2. Slightly high attenuation of the right MCA in the right sylvian fissure which may be chronic. Further evaluation with CTA or MRI is recommended if there is high clinical concern for acute right MCA territory ischemia. 3. 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 Correlation with history of malignancy recommended. Electronically Signed   By: Anner Crete M.D.   On: 11/17/2017 06:05   US Renal  Result Date: 11/17/2017 CLINICAL DATA:  Acute renal injury EXAM: RENAL / URINARY TRACT ULTRASOUND COMPLETE COMPARISON:  None. FINDINGS: Right Kidney: Length: 10.3 cm. Increased cortical echogenicity. No hydronephrosis. Than renal cortex. Left Kidney: Length: 10.2 cm. Increased cortical echogenicity. No hydronephrosis. Thinned renal cortex. Bladder: Appears normal for degree of bladder distention. IMPRESSION: 1. No hydronephrosis. 2. Increased cortical echogenicity and thinned renal cortex suggesting medical renal disease . Electronically Signed   By: Suzy Bouchard M.D.   On: 11/17/2017 08:32   Dg Bone Survey Met  Result Date: 11/17/2017 CLINICAL DATA:  He is complaining of numbness in his distal arms and legs which has been present for the last 2 weeks. It is been generally getting worse. He also has noted a painful knot in the right rib cage. He denies fever, chills, sweats.*comment was truncated* in a have a EXAM: METASTATIC BONE SURVEY COMPARISON:  Head -CT 11/17/2017 FINDINGS: Skull: Multiple lytic lesions throughout the calvarium. Lytic lesions scalloped the cortex of the LEFT and RIGHT humeri and clavicles. Lytic lesions in the radius and ulna of the forearms Lytic lesions scalloping enter cortex of the LEFT RIGHT femur. Lytic lesions within the fibulae. IMPRESSION: 1. Lytic lesions scalloping the inner  cortex of the axillary and appendicular skeleton including the skull, clavicles, humeri, femurs and distal long bones. 2. Findings most suggestive multiple myeloma. Electronically Signed   By: Suzy Bouchard M.D.   On: 11/17/2017 09:50    Positive ROS: All other systems have been reviewed and were otherwise negative with the exception of those mentioned in the HPI and as above.  Physical Exam: General: Alert, no acute distress Cardiovascular: No pedal edema Respiratory: No cyanosis, no use of accessory musculature GI: No organomegaly, abdomen is soft and non-tender Skin: No lesions in the area of chief complaint Neurologic: Sensation intact distally Psychiatric: Patient is competent for consent with normal mood and affect Lymphatic: No axillary or cervical lymphadenopathy  MUSCULOSKELETAL:   Physical exam bilateral lower extremities:  Negative pain with logroll or Stinchfield maneuver.  He has no focal tenderness to palpation on the right.  On the left he has some pain along the left iliac crest on palpation.  Otherwise no pain along the thigh knee or distal legs.  Neurovascularly intact throughout with good 2+ dorsalis pedis pulses and good 5 out of 5 motor strength.  Assessment: 1.  Impending fracture right femur diaphysis. 2.  Multiple lytic lesions left femur shaft.  Plan: Coralyn Mark and I had a long discussion about the nature of his lytic lesions.  He has likely multiple myeloma with  the tissue diagnosis forthcoming.  In terms of his femur lytic lesions the right femur is indicated for prophylactic fixation given the presence of pain with ambulation.  Based on Merrill's criteria, he has no indication for left femur prophylactic fixation.  Certainly this could change if his examination, and/or subjective symptoms were to change.  Our plan at this time would be tentatively for intramedullary fixation of the right femur on Monday.  He will need to be n.p.o. Sunday night just after  midnight. -During I discussed the risks, benefits, and indications of intramedullary fixation of the right femur.  He will be weightbearing as tolerated immediately postoperatively.  He will need to be placed on 6 weeks of DVT prophylaxis postoperatively as well. - The risks, benefits, and alternatives were discussed with the patient. There are risks associated with the surgery including, but not limited to, problems with anesthesia (death), infection, differences in leg length/angulation/rotation, fracture of bones, loosening or failure of implants, malunion, nonunion, hematoma (blood accumulation) which may require surgical drainage, blood clots, pulmonary embolism, nerve injury (foot drop), and blood vessel injury. The patient understands these risks and elects to proceed.     Nicholes Stairs, MD Cell [phone]    11/17/2017 10:24 PM

## 2017-11-17 NOTE — ED Notes (Signed)
Date and time results received: 11/17/17 5:02 AM  Test: Calcium Critical Value: 14.0  Name of Provider Notified: Dr. Roxanne Mins  Notified of lab results.

## 2017-11-17 NOTE — Progress Notes (Addendum)
Oncology note reviewed-call Dr. Stann Mainland with orthopedics who will be reviewing the patient's chart and make a determination as to whether prophylactic surgical procedures indicated for extensive lytic lesions involving the femurs.  Based on radiologist interpretation there does not appear to be any involvement of the femoral necks.  1445: Dr. Stann Mainland with orthopedics has reviewed the patient's chart and recommends the following: CT of both femurs without contrast to better evaluate the cortex to determine if surgical intervention is necessary.  No other intervention for other lytic lesions including upper extremities except to initiate chemotherapy when appropriate and consider the addition of biphosphonate's.  Erin Hearing, ANP

## 2017-11-17 NOTE — ED Notes (Signed)
ED Provider at bedside. 

## 2017-11-17 NOTE — H&P (Signed)
History and Physical    Andres Fernandez ZSW:[id number] DOB: 01-Feb-1956 DOA: 11/17/2017   PCP: Patient, No Pcp Per /Dr. Dagoberto Reef  Attending physician: Evangeline Gula  Patient coming from/Resides with: Private residence  Chief Complaint: Generalized weakness and anorexia  HPI: Andres Fernandez is a 61 y.o. male with medical history significant for mild BPH.  He reports it has been greater than 12 months since he has gone for a complete physical exam.  He reported to the ER today complaining of numbness on the right arm and leg with weakness for at least one week in the context of pain.  No facial droop noted.  He reported vomiting x2 prior to arrival and there is a "knot" on his right rib cage.  He later clarified to the ER physician that he had been having numbness in his distal arms and legs for 2 weeks that has been progressive in nature.  He also describes the previously mentioned not as being painful he denied fevers, chills or sweats.  He told the ER doctor he had been nauseous but had no vomiting.  Because his presenting symptoms were concerning for possible acute neurological event a CT of the head without contrast was obtained that revealed no acute intracranial hemorrhage but did demonstrate a slightly high attenuation area of the right MCA in the right sylvian fissure which may be chronic with further radiographic evaluation indicated if high clinical concern for acute right MCA territory ischemia.  More concerning were findings of diffuse osseous and calvarial lytic metastatic disease lesions i.e. concerning for underlying multiple myeloma.  There was also a lytic lesion involving the posterior right mastoid although this could also be an effusion.  Lab data confirmed suspicion for possible underlying malignancy related to multiple myeloma or other bone marrow cancer noting calcium was 14, total protein was normal at 7.3, white count was 36,400 with low normal neutrophils  borderline high lymphocytes with elevated absolute lymphocytes of 28.7%.  Patient also had acute renal failure with a BUN of 43 and a creatinine of 6.31.  Baseline renal function unknown but with no prior history documented of chronic kidney disease and clearly this creatinine is quite abnormal for this patient.  His peripheral smear revealed atypical lymphocytes.  He has voided into the urinal since arrival.  His coags are normal.  He will be admitted for workup of his abnormal calcium/abnormal white count with lymphocytosis concerning for bone marrow malignancy likely multiple myeloma.  His renal function also needs to be followed.  We will consult nephrology and oncology.  ED Course:  Vital Signs: BP (!) 142/90   Pulse 87   Temp 98.4 F (36.9 C) (Oral)   Resp 19   Ht _0  (1.727 m)   Wt 71.2 kg (157 lb)   SpO2 100%   BMI 23.87 kg/m  CT head without contrast: As above Chest x-ray: Negative Lab data: Sodium 136, potassium 4.5, chloride 100, CO2 23, glucose 107, BUN 43, creatinine 6.31, calcium 14, LFTs normal, poc troponin 0 0.03, white count 36,400 neutrophils 18, absolute neutrophils 6.6%, lymphocytes 79, absolute lymphocytes 28.7%, hemoglobin 10.9, platelets 210,000, coags are normal Medications and treatments: Normal saline bolus times 2 L  Review of Systems:  In addition to the HPI above,  No Fever-chills, myalgias or other constitutional symptoms No Headache, changes with Vision or hearing, new weakness, dizziness, dysarthria or word finding difficulty, gait disturbance or imbalance, tremors or seizure activity No problems swallowing food or Liquids, indigestion/reflux,  choking or coughing while eating, abdominal pain with or after eating No Chest pain, Cough or Shortness of Breath, palpitations, orthopnea or DOE No Abdominal pain,  melena,hematochezia, dark tarry stools, constipation No dysuria, malodorous urine, hematuria or flank pain No new skin rashes, lesions, masses or  bruises, No new joint pains, aches, swelling or redness No recent unintentional weight gain  No polyuria, polydypsia or polyphagia   Past Medical History:  Diagnosis Date  . BPH (benign prostatic hyperplasia)   . Tobacco abuse     Past Surgical History:  Procedure Laterality Date  . APPENDECTOMY      Social History   Socioeconomic History  . Marital status: Single    Spouse name: Not on file  . Number of children: Not on file  . Years of education: Not on file  . Highest education level: Not on file  Social Needs  . Financial resource strain: Not on file  . Food insecurity - worry: Not on file  . Food insecurity - inability: Not on file  . Transportation needs - medical: Not on file  . Transportation needs - non-medical: Not on file  Occupational History  . Not on file  Tobacco Use  . Smoking status: Current Every Day Smoker  . Smokeless tobacco: Never Used  Substance and Sexual Activity  . Alcohol use: Yes  . Drug use: No  . Sexual activity: Not on file  Other Topics Concern  . Not on file  Social History Narrative  . 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 COPD Mother   . Liver cancer Mother      Prior to Admission medications   Medication Sig Start Date End Date Taking? Authorizing Provider  ibuprofen (ADVIL,MOTRIN) 200 MG tablet Take 400 mg by mouth every 6 (six) hours as needed for mild pain.   Yes [provider]  Multiple Vitamin (MULTIVITAMIN WITH MINERALS) TABS tablet Take 1 tablet by mouth daily.   Yes [provider]    Physical Exam: Vitals:   11/17/17 0600 11/17/17 0630 11/17/17 0730 11/17/17 0800  BP: 131/86 (!) 123/111 (!) 144/106 (!) 142/90  Pulse: 76 93 87     Resp: _0 Temp:      TempSrc:      SpO2: 98% 100% 100%   Weight:      Height:          Constitutional: NAD, calm, comfortable Eyes: PERRL, lids and conjunctivae normal ENMT: Mucous membranes are dry. Posterior pharynx clear of any exudate or lesions.poor dentition.  Neck: normal, supple, no masses, no thyromegaly Respiratory: clear to auscultation bilaterally, no wheezing, no crackles. Normal respiratory effort. No accessory muscle use.  Cardiovascular: Regular rate and rhythm, no murmurs / rubs / gallops. No extremity edema. 2+ pedal pulses. No carotid bruits.  Abdomen: no tenderness, no masses palpated. No hepatosplenomegaly. Bowel sounds positive.  Musculoskeletal: no clubbing / cyanosis. No joint deformity upper and lower extremities. Good ROM, no contractures. Normal muscle tone.  Patient does have a small bony deformity on right lateral rib cage which is tender to palpation Skin: no rashes, lesions, ulcers. No induration Neurologic: CN 2-12 grossly intact. Sensation intact, DTR normal. Strength 5/5  x all 4 extremities.  No tremors or clonus noted on exam. Psychiatric: Normal judgment and insight. Alert and oriented x 3. Normal mood.    Labs on Admission: I have personally reviewed following labs and imaging studies  CBC: Recent Labs  Lab 11/17/17 0415 11/17/17 0429  WBC 36.4*  --   NEUTROABS 6.6  --   HGB 10.9* 10.2*  HCT 31.1* 30.0*  MCV 89.4  --   PLT 210  --    Basic Metabolic Panel: Recent Labs  Lab 11/17/17 0415 11/17/17 0429  NA 136 136  K 4.5 4.4  CL 100* 104  CO2 23  --   GLUCOSE 107* 106*  BUN 43* 40*  CREATININE 6.31* 6.30*  CALCIUM 14.0*  --    GFR: Estimated Creatinine Clearance: 12.1 mL/min (A) (by C-G formula based on SCr of 6.3 mg/dL (H)). Liver Function Tests: Recent Labs  Lab 11/17/17 0415  AST 41  ALT 31  ALKPHOS 78  BILITOT 0.5  PROT 7.3  ALBUMIN 3.9   No results for input(s): LIPASE, AMYLASE in the last 168 hours. No  results for input(s): AMMONIA in the last 168 hours. Coagulation Profile: Recent Labs  Lab 11/17/17 0415  INR 0.99   Cardiac Enzymes: No results for input(s): CKTOTAL, CKMB, CKMBINDEX, TROPONINI in the last 168 hours. BNP (last 3 results) No results for input(s): PROBNP in the last 8760 hours. HbA1C: No results for input(s): HGBA1C in the last 72 hours. CBG: No results for input(s): GLUCAP in the last 168 hours. Lipid Profile: No results for input(s): CHOL, HDL, LDLCALC, TRIG, CHOLHDL, LDLDIRECT in the last 72 hours. Thyroid Function Tests: No results for input(s): TSH, T4TOTAL, FREET4, T3FREE, THYROIDAB in the last 72 hours. Anemia Panel: No results for input(s): VITAMINB12, FOLATE, FERRITIN, TIBC, IRON, RETICCTPCT in the last 72 hours. Urine analysis:    Component Value Date/Time   COLORURINE YELLOW 04/13/2015 1410   APPEARANCEUR CLEAR 04/13/2015 1410   LABSPEC 1.024 04/13/2015 1410   PHURINE 5.5 04/13/2015 1410   GLUCOSEU NEGATIVE 04/13/2015 1410   HGBUR NEGATIVE 04/13/2015 1410   BILIRUBINUR NEGATIVE 04/13/2015 1410   KETONESUR NEGATIVE 04/13/2015 1410   PROTEINUR NEGATIVE 04/13/2015 1410   UROBILINOGEN 0.2 04/13/2015 1410   NITRITE NEGATIVE 04/13/2015 1410   LEUKOCYTESUR TRACE (A) 04/13/2015 1410   Sepsis Labs: _0 (procalcitonin:4,lacticidven:4) )No results found for this or any previous visit (from the past 240 hour(s)).   Radiological Exams on Admission: Dg Chest 2 View  Result Date: 11/17/2017 CLINICAL DATA:  61 year old male with chest pain. EXAM: CHEST  2 VIEW COMPARISON:  Chest radiograph dated 05/10/2015 FINDINGS: Mild right lung base atelectatic changes/ scarring. There is no focal consolidation, pleural effusion, or pneumothorax. The cardiac silhouette is within normal limits. There is mild degenerative changes of the spine. No acute osseous pathology. IMPRESSION: No active cardiopulmonary disease. Electronically Signed   By: Anner Crete M.D.    On: 11/17/2017 07:08   Ct Head Wo Contrast  Result Date: 11/17/2017 CLINICAL DATA:  61 year old male with numbness and tingling. Paraplegia. EXAM: CT HEAD WITHOUT CONTRAST TECHNIQUE: Contiguous axial images were obtained from the base of the skull through the vertex without intravenous contrast. COMPARISON:  Facial bone CT dated 03/27/2010 FINDINGS: Brain: The ventricles and sulci appropriate size for patient's age. Minimal periventricular and deep white matter chronic microvascular ischemic changes noted. There is no acute intracranial hemorrhage. No mass effect or midline shift noted. No extra-axial fluid collection. Vascular: There is slight high attenuation of  the right MCA in the sylvian fissure (series 7 image 12). This may be chronic. If there is clinical concern for right MCA territory ischemia further evaluation with CT angiography of the head or MRI is recommended. Skull: There are innumerable lucent calvarial lesions concerning for multiple myeloma or metastatic disease. Correlation with history of known malignancy recommended. No acute fracture. Sinuses/Orbits: There is complete opacification of the visualized right maxillary sinus. Mild mucoperiosteal thickening of paranasal sinuses. There is a lytic lesion involving the posterior right mastoid versus effusion. Other: None IMPRESSION: 1. No acute intracranial hemorrhage. 2. Slightly high attenuation of the right MCA in the right sylvian fissure which may be chronic. Further evaluation with CTA or MRI is recommended if there is high clinical concern for acute right MCA territory ischemia. 3. 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 Correlation with history of malignancy recommended. Electronically Signed   By: Anner Crete M.D.   On: 11/17/2017 06:05   US Renal  Result Date: 11/17/2017 CLINICAL DATA:  Acute renal injury EXAM: RENAL / URINARY TRACT ULTRASOUND COMPLETE COMPARISON:  None. FINDINGS: Right Kidney: Length: 10.3 cm.  Increased cortical echogenicity. No hydronephrosis. Than renal cortex. Left Kidney: Length: 10.2 cm. Increased cortical echogenicity. No hydronephrosis. Thinned renal cortex. Bladder: Appears normal for degree of bladder distention. IMPRESSION: 1. No hydronephrosis. 2. Increased cortical echogenicity and thinned renal cortex suggesting medical renal disease . Electronically Signed   By: Suzy Bouchard M.D.   On: 11/17/2017 08:32    EKG: (Independently reviewed) sinus rhythm with ventricular rate 86 bpm, QTC 437 ms, normal R wave rotation, voltage criteria met for LVH, no acute ischemic changes  Assessment/Plan Principal Problem:   Acute renal failure (ARF)  -Patient presents with complaints of generalized tingling and weakness of extremities associated with malaise and nausea with laboratory data revealing acute renal failure with significant elevation in creatinine of 6.31 as well as other lab data concerning for underlying malignancy -Baseline renal function unknown although with no prior documented history of chronic kidney disease clearly much lower than the presenting creatinine of 6.31 -Continues to make urine -Obtain renal ultrasound -Nephrology consultation given suspected etiology to renal failure 2/2 underlying malignancy  -Follow labs -Daily weights; strict I/O  Active Problems:   ?? Multiple myeloma vs other bone marrow malignancy/Hypercalcemia/Lytic bone lesions on xray -Patient presents with mild neurological symptoms without definitive evidence of focal neurological deficit -CT head reveals lytic lesions concerning for malignancy/multiple myeloma -Patient has normal serum total protein but elevated calcium 14 -Normal saline at 150 cc/h -I initially gave a dose of Lasix 80 mg x1 but at the recommendation of the oncologist will not give additional doses of Lasix -Dexamethasone 10 mg IV every 6 hours -Pamidronate 30 mg IV x1 dose and/or at dose recommended by pharmacy given low  creatinine clearance -Follow electrolytes re: Ca+ every 12 hours -SPEP/UPEP -Other labs as recommended by oncologist: Beta-2 microglobulin serum, kappa/lambda light chains, lymphocyte subsets, flow cytometry -CT-guided bone marrow biopsy and aspiration as ordered by oncologist -OxyIR for moderate pain and IV morphine for severe pain -Zofran for nausea    Elevated blood-pressure reading without diagnosis of hypertension -Denies prior history of hypertension -Current blood pressure somewhat elevated in the context of anxiety and pain -EKG does have criteria of LVH concerning for possible long-standing, undetected hypertension that is untreated -Echocardiogram -Hydralazine IV prn with later determination of appropriate antihypertensive medication after echocardiogram resulted; also need to await to determine if renal function can return to normal  Anemia -Likely related to underlying malignancy -Hemoglobin greater than 7.0 -Has not had CPE in greater than 12 months so we will check TSH as well as PSA    BPH (benign prostatic hyperplasia) -Patient reports apparent mild symptoms of BPH the past    Tobacco abuse -Cessation counseling offered -Patient reports smokes 1 pack over 2-day.      DVT prophylaxis: Subcutaneous heparin Code Status: Full Family Communication: No family at bedside Disposition Plan: Home Consults called: Nephrology/[place]; Oncology/[place]    Andres Fernandez L. ANP-BC [facility] Pager [phone]   If 7PM-7AM, please contact night-coverage [email or web address] Password [email or web address]  11/17/2017, 9:16 AM

## 2017-11-17 NOTE — ED Notes (Signed)
Patient transported to Ultrasound 

## 2017-11-17 NOTE — ED Provider Notes (Signed)
[facility] EMERGENCY DEPARTMENT Provider Note   CSN: [id number] Arrival date & time: 11/17/17  0354     History   Chief Complaint Chief Complaint  Patient presents with  . Numbness    HPI Andres Fernandez is a 61 y.o. male.  The history is provided by the patient.  He is complaining of numbness in his distal arms and legs which has been present for the last 2 weeks.  It is been generally getting worse.  He also has noted a painful knot in the right rib cage.  He denies fever, chills, sweats.  He has had some nausea but no vomiting.  He denies any cough.  He has not done anything to treat this.  He does not have any significant past history.  History reviewed. No pertinent past medical history.  Patient Active Problem List   Diagnosis Date Noted  . Acute renal failure (ARF) ([place]) 11/17/2017    History reviewed. No pertinent surgical history.     Home Medications    Prior to Admission medications   Medication Sig Start Date End Date Taking? Authorizing Provider  ibuprofen (ADVIL,MOTRIN) 200 MG tablet Take 400 mg by mouth every 6 (six) hours as needed for mild pain.   Yes [provider]  Multiple Vitamin (MULTIVITAMIN WITH MINERALS) TABS tablet Take 1 tablet by mouth daily.   Yes [provider]    Family History No family history on file.  Social History Social History   Tobacco Use  . Smoking status: Current Every Day Smoker  . Smokeless tobacco: Never Used  Substance Use Topics  . Alcohol use: Yes  . Drug use: No     Allergies   Patient has no known allergies.   Review of Systems Review of Systems  All other systems reviewed and are negative.    Physical Exam Updated Vital Signs BP (!) 123/111   Pulse 93   Temp 98.4 F (36.9 C) (Oral)   Resp 18   Ht 5' 8"  (1.727 m)   Wt 71.2 kg (157 lb)   SpO2 100%   BMI 23.87 kg/m   Physical Exam  Nursing note and vitals reviewed.  61 year old male, resting  comfortably and in no acute distress. Vital signs are significant for hypertension. Oxygen saturation is 100%, which is normal. Head is normocephalic and atraumatic. PERRLA, EOMI. Oropharynx is clear. Neck is nontender and supple without adenopathy or JVD. Back is nontender and there is no CVA tenderness. Lungs are clear without rales, wheezes, or rhonchi. Chest is moderately tender in the right lateral rib cage.  There is considerable irregularity of the rib contours bilaterally. Heart has regular rate and rhythm without murmur. Abdomen is soft, flat, nontender without masses or hepatosplenomegaly and peristalsis is normoactive. Extremities have no cyanosis or edema, full range of motion is present. Skin is warm and dry without rash. Neurologic: Mental status is normal, cranial nerves are intact, there are no motor or sensory deficits.  ED Treatments / Results  Labs (all labs ordered are listed, but only abnormal results are displayed) Labs Reviewed  CBC - Abnormal; Notable for the following components:      Result Value   WBC 36.4 (*)    RBC 3.48 (*)    Hemoglobin 10.9 (*)    HCT 31.1 (*)    All other components within normal limits  DIFFERENTIAL - Abnormal; Notable for the following components:   Lymphs Abs 28.7 (*)  Monocytes Absolute 1.1 (*)    All other components within normal limits  COMPREHENSIVE METABOLIC PANEL - Abnormal; Notable for the following components:   Chloride 100 (*)    Glucose, Bld 107 (*)    BUN 43 (*)    Creatinine, Ser 6.31 (*)    Calcium 14.0 (*)    GFR calc non Af Amer 9 (*)    GFR calc Af Amer 10 (*)    All other components within normal limits  I-STAT CHEM 8, ED - Abnormal; Notable for the following components:   BUN 40 (*)    Creatinine, Ser 6.30 (*)    Glucose, Bld 106 (*)    Calcium, Ion 1.62 (*)    Hemoglobin 10.2 (*)    HCT 30.0 (*)    All other components within normal limits  PROTIME-INR  APTT  URINALYSIS, ROUTINE W REFLEX  MICROSCOPIC  I-STAT TROPONIN, ED  CBG MONITORING, ED    Radiology Ct Head Wo Contrast  Result Date: 11/17/2017 CLINICAL DATA:  61 year old male with numbness and tingling. Paraplegia. EXAM: CT HEAD WITHOUT CONTRAST TECHNIQUE: Contiguous axial images were obtained from the base of the skull through the vertex without intravenous contrast. COMPARISON:  Facial bone CT dated 03/27/2010 FINDINGS: Brain: The ventricles and sulci appropriate size for patient's age. Minimal periventricular and deep white matter chronic microvascular ischemic changes noted. There is no acute intracranial hemorrhage. No mass effect or midline shift noted. No extra-axial fluid collection. Vascular: There is slight high attenuation of the right MCA in the sylvian fissure (series 7 image 12). This may be chronic. If there is clinical concern for right MCA territory ischemia further evaluation with CT angiography of the head or MRI is recommended. Skull: There are innumerable lucent calvarial lesions concerning for multiple myeloma or metastatic disease. Correlation with history of known malignancy recommended. No acute fracture. Sinuses/Orbits: There is complete opacification of the visualized right maxillary sinus. Mild mucoperiosteal thickening of paranasal sinuses. There is a lytic lesion involving the posterior right mastoid versus effusion. Other: None IMPRESSION: 1. No acute intracranial hemorrhage. 2. Slightly high attenuation of the right MCA in the right sylvian fissure which may be chronic. Further evaluation with CTA or MRI is recommended if there is high clinical concern for acute right MCA territory ischemia. 3. 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 Correlation with history of malignancy recommended. Electronically Signed   By: Anner Crete M.D.   On: 11/17/2017 06:05    Procedures Procedures (including critical care time) CRITICAL CARE Performed by: Delora Fuel Total critical care time: 45  minutes Critical care time was exclusive of separately billable procedures and treating other patients. Critical care was necessary to treat or prevent imminent or life-threatening deterioration. Critical care was time spent personally by me on the following activities: development of treatment plan with patient and/or surrogate as well as nursing, discussions with consultants, evaluation of patient's response to treatment, examination of patient, obtaining history from patient or surrogate, ordering and performing treatments and interventions, ordering and review of laboratory studies, ordering and review of radiographic studies, pulse oximetry and re-evaluation of patient's condition.  Medications Ordered in ED Medications  sodium chloride 0.9 % bolus 2,000 mL (2,000 mLs Intravenous New Bag/Given 11/17/17 0350)     Initial Impression / Assessment and Plan / ED Course  I have reviewed the triage vital signs and the nursing notes.  Pertinent labs & imaging results that were available during my care of the patient were reviewed by  me and considered in my medical decision making (see chart for details).  Vague neurologic complaints without corroborating physical findings.  Laboratory evaluation had been initiated prior to my seeing the patient, and he has severe hypercalcemia, with calcium 14.0.  Also of significance, he has renal failure with creatinine 6.31.  He has no known history of renal disease.  Mild anemia is present with hemoglobin of 10.9 and normal RBC indices.  Platelet count is normal.  WBC is markedly elevated at 36.4 with differential pending.  CT scan of the head shows multiple lytic lesions.  Overall picture is most consistent with multiple myeloma although another malignancy with multiple bone metastases could have similar presentation.  I have explained this to the patient.  He is started on saline diuresis to treat his hypercalcemia.  Case is discussed with Dr. Myna Hidalgo of [facility], who agrees to admit the patient.  Final Clinical Impressions(s) / ED Diagnoses   Final diagnoses:  Hypercalcemia  Acute renal failure, unspecified acute renal failure type (HCC)  Normochromic normocytic anemia  Paresthesia    ED Discharge Orders    None       Delora Fuel, MD 61/60/76 [phone]

## 2017-11-17 NOTE — Progress Notes (Signed)
Patient arrived to the room from the ED. Patient arrived with belongings. Patient VS are stable B/P132/83 and heart rate 79. Patient is alert and oriented. Iv access in RAC. Patient is resting comfortably in bed.

## 2017-11-17 NOTE — ED Notes (Signed)
Admitting MD paged to [facility] @ [phone] to [phone].

## 2017-11-17 NOTE — Consult Note (Signed)
Chief Complaint: Patient was seen in consultation today for hypercalcemia  Referring Physician(s): Dr. Lebron Conners  Supervising Physician: Jacqulynn Cadet  Patient Status: [facility] - In-pt  History of Present Illness: Andres Fernandez is a 61 y.o. male with no significant past medical history presented to [facility] with bone pain, numbness, anorexia, and weakness.  Patient found to have hypercalcemia.   DG Bone Survey shows: 1. Lytic lesions scalloping the inner cortex of the axillary and appendicular skeleton including the skull, clavicles, humeri, femurs and distal long bones. 2. Findings most suggestive multiple myeloma.  IR consulted for bone marrow biopsy at the request of Dr. Lebron Conners.    Past Medical History:  Diagnosis Date  . BPH (benign prostatic hyperplasia)   . Tobacco abuse     Past Surgical History:  Procedure Laterality Date  . APPENDECTOMY      Allergies: Patient has no known allergies.  Medications: Prior to Admission medications   Medication Sig Start Date End Date Taking? Authorizing Provider  ibuprofen (ADVIL,MOTRIN) 200 MG tablet Take 400 mg by mouth every 6 (six) hours as needed for mild pain.   Yes [provider]  Multiple Vitamin (MULTIVITAMIN WITH MINERALS) TABS tablet Take 1 tablet by mouth daily.   Yes [provider]     Family History  Problem Relation Age of Onset  . COPD Mother   . Liver cancer Mother     Social History   Socioeconomic History  . Marital status: Single    Spouse name: None  . Number of children: None  . Years of education: None  . Highest education level: None  Social Needs  . Financial resource strain: None  . Food insecurity - worry: None  . Food insecurity - inability: None  . Transportation needs - medical: None  . Transportation needs - non-medical: None  Occupational History  . None  Tobacco Use  . Smoking status: Current Every Day Smoker  . Smokeless tobacco: Never Used  Substance and  Sexual Activity  . Alcohol use: Yes  . Drug use: No  . Sexual activity: None  Other Topics Concern  . None  Social History Narrative  . None    Review of Systems  Constitutional: Negative for fatigue and fever.  Respiratory: Negative for cough and shortness of breath.   Cardiovascular: Negative for chest pain.  Gastrointestinal: Negative for abdominal pain.  Musculoskeletal: Positive for back pain.  Psychiatric/Behavioral: Negative for behavioral problems and confusion.    Vital Signs: BP (!) 142/90   Pulse 87   Temp 98.4 F (36.9 C) (Oral)   Resp 19   Ht 5' 8" (1.727 m)   Wt 157 lb (71.2 kg)   SpO2 100%   BMI 23.87 kg/m   Physical Exam  Constitutional: He is oriented to person, place, and time. He appears well-developed.  Cardiovascular: Normal rate, regular rhythm and normal heart sounds.  Pulmonary/Chest: Effort normal and breath sounds normal. No respiratory distress.  Abdominal: Soft.  Musculoskeletal: Normal range of motion. He exhibits tenderness (generalized soreness).  Neurological: He is alert and oriented to person, place, and time.  Skin: Skin is warm and dry.  Psychiatric: He has a normal mood and affect. His behavior is normal. Judgment and thought content normal.  Nursing note and vitals reviewed.   Imaging: Dg Chest 2 View  Result Date: 11/17/2017 CLINICAL DATA:  61 year old male with chest pain. EXAM: CHEST  2 VIEW COMPARISON:  Chest radiograph dated 05/10/2015 FINDINGS: Mild right lung base  atelectatic changes/ scarring. There is no focal consolidation, pleural effusion, or pneumothorax. The cardiac silhouette is within normal limits. There is mild degenerative changes of the spine. No acute osseous pathology. IMPRESSION: No active cardiopulmonary disease. Electronically Signed   By: Anner Crete M.D.   On: 11/17/2017 07:08   Ct Head Wo Contrast  Result Date: 11/17/2017 CLINICAL DATA:  61 year old male with numbness and tingling. Paraplegia.  EXAM: CT HEAD WITHOUT CONTRAST TECHNIQUE: Contiguous axial images were obtained from the base of the skull through the vertex without intravenous contrast. COMPARISON:  Facial bone CT dated 03/27/2010 FINDINGS: Brain: The ventricles and sulci appropriate size for patient's age. Minimal periventricular and deep white matter chronic microvascular ischemic changes noted. There is no acute intracranial hemorrhage. No mass effect or midline shift noted. No extra-axial fluid collection. Vascular: There is slight high attenuation of the right MCA in the sylvian fissure (series 7 image 12). This may be chronic. If there is clinical concern for right MCA territory ischemia further evaluation with CT angiography of the head or MRI is recommended. Skull: There are innumerable lucent calvarial lesions concerning for multiple myeloma or metastatic disease. Correlation with history of known malignancy recommended. No acute fracture. Sinuses/Orbits: There is complete opacification of the visualized right maxillary sinus. Mild mucoperiosteal thickening of paranasal sinuses. There is a lytic lesion involving the posterior right mastoid versus effusion. Other: None IMPRESSION: 1. No acute intracranial hemorrhage. 2. Slightly high attenuation of the right MCA in the right sylvian fissure which may be chronic. Further evaluation with CTA or MRI is recommended if there is high clinical concern for acute right MCA territory ischemia. 3. 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 Correlation with history of malignancy recommended. Electronically Signed   By: Anner Crete M.D.   On: 11/17/2017 06:05   US Renal  Result Date: 11/17/2017 CLINICAL DATA:  Acute renal injury EXAM: RENAL / URINARY TRACT ULTRASOUND COMPLETE COMPARISON:  None. FINDINGS: Right Kidney: Length: 10.3 cm. Increased cortical echogenicity. No hydronephrosis. Than renal cortex. Left Kidney: Length: 10.2 cm. Increased cortical echogenicity. No  hydronephrosis. Thinned renal cortex. Bladder: Appears normal for degree of bladder distention. IMPRESSION: 1. No hydronephrosis. 2. Increased cortical echogenicity and thinned renal cortex suggesting medical renal disease . Electronically Signed   By: Suzy Bouchard M.D.   On: 11/17/2017 08:32   Dg Bone Survey Met  Result Date: 11/17/2017 CLINICAL DATA:  He is complaining of numbness in his distal arms and legs which has been present for the last 2 weeks. It is been generally getting worse. He also has noted a painful knot in the right rib cage. He denies fever, chills, sweats.*comment was truncated* in a have a EXAM: METASTATIC BONE SURVEY COMPARISON:  Head -CT 11/17/2017 FINDINGS: Skull: Multiple lytic lesions throughout the calvarium. Lytic lesions scalloped the cortex of the LEFT and RIGHT humeri and clavicles. Lytic lesions in the radius and ulna of the forearms Lytic lesions scalloping enter cortex of the LEFT RIGHT femur. Lytic lesions within the fibulae. IMPRESSION: 1. Lytic lesions scalloping the inner cortex of the axillary and appendicular skeleton including the skull, clavicles, humeri, femurs and distal long bones. 2. Findings most suggestive multiple myeloma. Electronically Signed   By: Suzy Bouchard M.D.   On: 11/17/2017 09:50    Labs:  CBC: Recent Labs    11/17/17 0415 11/17/17 0429  WBC 36.4*  --   HGB 10.9* 10.2*  HCT 31.1* 30.0*  PLT 210  --  COAGS: Recent Labs    11/17/17 0415  INR 0.99  APTT 24    BMP: Recent Labs    11/17/17 0415 11/17/17 0429  NA 136 136  K 4.5 4.4  CL 100* 104  CO2 23  --   GLUCOSE 107* 106*  BUN 43* 40*  CALCIUM 14.0*  --   CREATININE 6.31* 6.30*  GFRNONAA 9*  --   GFRAA 10*  --     LIVER FUNCTION TESTS: Recent Labs    11/17/17 0415  BILITOT 0.5  AST 41  ALT 31  ALKPHOS 78  PROT 7.3  ALBUMIN 3.9    TUMOR MARKERS: No results for input(s): AFPTM, CEA, CA199, CHROMGRNA in the last 8760 hours.  Assessment and  Plan: Hypercalcemia, lytic lesions suspicious for multiple myeloma.  IR consulted for bone marrow biopsy at the request of Dr. Lebron Conners.  Patient to remain inpatient at this time.  Will work towards bone marrow biopsy early next week as schedule allows.  Will place orders for NPO after midnight Sunday.  Risks and benefits discussed with the patient including, but not limited to bleeding, infection, damage to adjacent structures or low yield requiring additional tests. All of the patient's questions were answered, patient is agreeable to proceed. Consent signed and in chart.  Thank you for this interesting consult.  I greatly enjoyed meeting Andres Fernandez and look forward to participating in their care.  A copy of this report was sent to the requesting provider on this date.  Electronically Signed: Docia Barrier, PA 11/17/2017, 2:18 PM   I spent a total of 40 Minutes    in face to face in clinical consultation, greater than 50% of which was counseling/coordinating care for hypercalcemia.

## 2017-11-17 NOTE — ED Triage Notes (Signed)
Pt states that for the past week he has had numbness on the R side, arm and leg. R arm weakness, pt states he cant because it hurts, no facial droop noted. Reports also vomiting x 2 today and a "knot" on his R rib cage

## 2017-11-17 NOTE — Consult Note (Signed)
Renal Service Consult Note [facility]  Andres Fernandez 11/17/2017 Sol Blazing Requesting Physician:  Dr Evangeline Gula  Reason for Consult:  Renal failure HPI: The patient is a 61 y.o. year-old with hx of tobacco use and BPH who presented generalized weakness and anorexia and was found to have Ca of 14.  Evaluation in ED showed lytic lesions of the bones and creat 6, also anemia and HTN.  Pt admitted.  We are asked to see for renal failure.    Pt says for the last 2 weeks he has been having a lot of nausea and anorexia, some emesis.  No abd pain, no fevers, no cough and no CP.  Has been having pains in both legs for 2 wks as well, pain and a nodule on the R rib cage, and other bony pain in the L arm.  Denies any SOB .  No voiding issues, no dysuria.  +urgency.    UA > negative, pH 7.0 , 1.005 CXR 12/8 - no active disease Renal US:  Right kidney: 10 cm. Increased cortical echogenicity. No hydronephrosis.  Left kidney 10.2 cm. Increased cortical echogenicity. No hydronephrosis. Thinned renal cortex.  Bladder: Appears normal for degree of bladder distention.   ROS  no joint pain   no HA  no blurry vision  no rash  no diarrhea  no dysuria  no difficulty voiding   Past Medical History  Past Medical History:  Diagnosis Date  . BPH (benign prostatic hyperplasia)   . Tobacco abuse    Past Surgical History  Past Surgical History:  Procedure Laterality Date  . APPENDECTOMY     Family History  Family History  Problem Relation Age of Onset  . COPD Mother   . Liver cancer Mother    Social History  reports that he has been smoking.  he has never used smokeless tobacco. He reports that he drinks alcohol. He reports that he does not use drugs. Allergies No Known Allergies Home medications Prior to Admission medications   Medication Sig Start Date End Date Taking? Authorizing Provider  ibuprofen (ADVIL,MOTRIN) 200 MG tablet Take 400 mg by mouth every 6 (six) hours as  needed for mild pain.   Yes [provider]  Multiple Vitamin (MULTIVITAMIN WITH MINERALS) TABS tablet Take 1 tablet by mouth daily.   Yes [provider]   Liver Function Tests Recent Labs  Lab 11/17/17 0415  AST 41  ALT 31  ALKPHOS 78  BILITOT 0.5  PROT 7.3  ALBUMIN 3.9   No results for input(s): LIPASE, AMYLASE in the last 168 hours. CBC Recent Labs  Lab 11/17/17 0415 11/17/17 0429  WBC 36.4*  --   NEUTROABS 6.6  --   HGB 10.9* 10.2*  HCT 31.1* 30.0*  MCV 89.4  --   PLT 210  --    Basic Metabolic Panel Recent Labs  Lab 11/17/17 0415 11/17/17 0429 11/17/17 1047  NA 136 136  --   K 4.5 4.4  --   CL 100* 104  --   CO2 23  --   --   GLUCOSE 107* 106*  --   BUN 43* 40*  --   CREATININE 6.31* 6.30*  --   CALCIUM 14.0*  --   --   PHOS  --   --  4.8*   Iron/TIBC/Ferritin/ %Sat No results found for: IRON, TIBC, FERRITIN, IRONPCTSAT  Vitals:   11/17/17 0600 11/17/17 0630 11/17/17 0730 11/17/17 0800  BP: 131/86 [name])  123/111 (!) 144/106 (!) 142/90  Pulse: 76 93 87   Resp: 16 18 19    Temp:      TempSrc:      SpO2: 98% 100% 100%   Weight:      Height:       Exam Gen thin AAM, no distress No rash, cyanosis or gangrene Sclera anicteric, throat clear  No jvd or bruits Chest clear bilat RRR no MRG Abd soft ntnd no mass or ascites +bs  GU normal male MS no joint effusions or deformity Ext no LE edema / no wounds or ulcers Neuro is alert, Ox 3 , nf    Impression: 1. Renal failure - in setting of ^^Ca++, bone pain and lytic lesions, consistent with multiple myeloma.  ONC has been consulted.  Looks dry still, recommend IVF"s / hydration and try to keep UOP up if possible, without causing vol overload/ resp issues.    2. Duncan Dull - per primary / ONC 3. Nausea/ anorexia - prob d/t #1 4. Vol - looks dry 5. Hypercalcemia - getting NS at 150/hr, agree. Getting 30 mg pamidronate. Will add calcitonin for 24-48 hrs.      Plan - as above.   Kelly Splinter MD [employer] pager [phone]   11/17/2017, 6:43 PM

## 2017-11-18 ENCOUNTER — Inpatient Hospital Stay (HOSPITAL_COMMUNITY): Payer: BLUE CROSS/BLUE SHIELD

## 2017-11-18 DIAGNOSIS — I361 Nonrheumatic tricuspid (valve) insufficiency: Secondary | ICD-10-CM

## 2017-11-18 LAB — CBC
HCT: 27.6 % — ABNORMAL LOW (ref 39.0–52.0)
HEMOGLOBIN: 9.7 g/dL — AB (ref 13.0–17.0)
MCH: 30.9 pg (ref 26.0–34.0)
MCHC: 35.1 g/dL (ref 30.0–36.0)
MCV: 87.9 fL (ref 78.0–100.0)
PLATELETS: 193 10*3/uL (ref 150–400)
RBC: 3.14 MIL/uL — AB (ref 4.22–5.81)
RDW: 13.4 % (ref 11.5–15.5)
WBC: 36.4 10*3/uL — AB (ref 4.0–10.5)

## 2017-11-18 LAB — COMPREHENSIVE METABOLIC PANEL
ALBUMIN: 3.5 g/dL (ref 3.5–5.0)
ALK PHOS: 63 U/L (ref 38–126)
ALT: 26 U/L (ref 17–63)
ANION GAP: 12 (ref 5–15)
AST: 26 U/L (ref 15–41)
BILIRUBIN TOTAL: 0.5 mg/dL (ref 0.3–1.2)
BUN: 50 mg/dL — ABNORMAL HIGH (ref 6–20)
CHLORIDE: 103 mmol/L (ref 101–111)
CO2: 22 mmol/L (ref 22–32)
CREATININE: 5.96 mg/dL — AB (ref 0.61–1.24)
Calcium: 11.5 mg/dL — ABNORMAL HIGH (ref 8.9–10.3)
GFR, EST AFRICAN AMERICAN: 11 mL/min — AB (ref >60)
GFR, EST NON AFRICAN AMERICAN: 9 mL/min — AB (ref >60)
Glucose, Bld: 152 mg/dL — ABNORMAL HIGH (ref 65–99)
Potassium: 4.4 mmol/L (ref 3.5–5.1)
Sodium: 137 mmol/L (ref 135–145)
Total Protein: 6.4 g/dL — ABNORMAL LOW (ref 6.5–8.1)

## 2017-11-18 LAB — HIV ANTIBODY (ROUTINE TESTING W REFLEX): HIV Screen 4th Generation wRfx: NONREACTIVE

## 2017-11-18 LAB — BETA 2 MICROGLOBULIN, SERUM: BETA 2 MICROGLOBULIN: 25.7 mg/L — AB (ref 0.6–2.4)

## 2017-11-18 LAB — ECHOCARDIOGRAM COMPLETE
Height: 68 in
WEIGHTICAEL: 2286.4 [oz_av]

## 2017-11-18 MED ORDER — SODIUM CHLORIDE 0.45 % IV SOLN
INTRAVENOUS | Status: DC
  Administered 2017-11-18 – 2017-11-20 (×4): via INTRAVENOUS

## 2017-11-18 MED ORDER — SODIUM CHLORIDE 0.9 % IV SOLN
30.0000 mg | Freq: Once | INTRAVENOUS | Status: AC
  Administered 2017-11-18: 30 mg via INTRAVENOUS
  Filled 2017-11-18: qty 10

## 2017-11-18 NOTE — Progress Notes (Signed)
[facility] Progress Note  Patient: Andres Fernandez ATF:[id number]   PCP: Patient, No Pcp Per DOB: 10-16-1956   DOA: 11/17/2017   DOS: 11/18/2017   Date of Service: the patient was seen and examined on 11/18/2017  Subjective: Pain is well controlled.  No nausea no vomiting.  No fever no chills.  Brief hospital course: Pt. with PMH of BPH; admitted on 11/17/2017, presented with complaint of generalized weakness, was found to have acute kidney injury, hypercalcemia, skeletal lytic lesions concerning for multiple myeloma. Currently further plan is continue IV fluids.  Assessment and Plan:  Acute kidney injury -Patient presents with complaints of generalized tingling and weakness of extremities associated with malaise and nausea with laboratory data revealing acute renal failure with significant elevation in creatinine of 6.31 as well as other lab data concerning for underlying malignancy -Baseline renal function unknown although with no prior documented history of chronic kidney disease clearly much lower than the presenting creatinine of 6.31 -Continues to make urine -Unremarkable renal ultrasound -Nephrology consultation given suspected etiology to renal failure 2/2 underlying malignancy  -Follow nephrology recommendation, continue IV fluids for now.  Active Problems:   ?? Multiple myeloma vs other bone marrow malignancy/Hypercalcemia/Lytic bone lesions on xray -Patient presents with mild neurological symptoms without definitive evidence of focal neurological deficit -CT head reveals lytic lesions concerning for malignancy/multiple myeloma -Patient has normal serum total protein but elevated calcium 14 -Normal saline at 150 cc/h -Dexamethasone 10 mg IV every 6 hours -Pamidronate 30 mg IV x1 dose and/or at dose recommended by pharmacy given low creatinine clearance -Follow electrolytes re: Ca+ every 12 hours -SPEP/UPEP -Other labs as recommended by oncologist: Beta-2 microglobulin  serum, kappa/lambda light chains, lymphocyte subsets, flow cytometry -CT-guided bone marrow biopsy and aspiration as ordered by oncologist -OxyIR for moderate pain and IV morphine for severe pain -Zofran for nausea    Elevated blood-pressure reading without diagnosis of hypertension -Denies prior history of hypertension -Current blood pressure somewhat elevated in the context of anxiety and pain -EKG does have criteria of LVH concerning for possible long-standing, undetected hypertension that is untreated -Echocardiogram -Hydralazine IV prn with later determination of appropriate antihypertensive medication after echocardiogram resulted; also need to await to determine if renal function can return to normal    Anemia -Likely related to underlying malignancy -Hemoglobin greater than 7.0 -Has not had CPE in greater than 12 months so we will check TSH as well as PSA    BPH (benign prostatic hyperplasia) -Patient reports apparent mild symptoms of BPH the past    Tobacco abuse -Cessation counseling offered -Patient reports smokes 1 pack over 2-day.  Diet: renal diet DVT Prophylaxis: subcutaneous Heparin  Advance goals of care discussion: full code  Family Communication: family was present at bedside, at the time of interview. The pt provided permission to discuss medical plan with the family. Opportunity was given to ask question and all questions were answered satisfactorily.   Disposition:  Discharge to home.  Consultants:oncology, nephrology, ortho Procedures: none  Antibiotics: Anti-infectives (From admission, onward)   None       Objective: Physical Exam: Vitals:   11/17/17 2340 11/18/17 0440 11/18/17 0500 11/18/17 0551  BP: 124/80   123/76  Pulse: 76     Resp: 19 (!) 22  17  Temp: 98.3 F (36.8 C)   98.1 F (36.7 C)  TempSrc: Oral   Oral  SpO2: 100%   98%  Weight:   64.8 kg (142 lb 14.4 oz)   Height:  No intake or output data in the 24 hours ending  11/18/17 1328 Filed Weights   11/17/17 0409 11/18/17 0500  Weight: 71.2 kg (157 lb) 64.8 kg (142 lb 14.4 oz)   General: Alert, Awake and Oriented to Time, Place and Person. Appear in mild distress, affect appropriate Eyes: PERRL, Conjunctiva normal ENT: Oral Mucosa clear moist. Neck: no JVD, no Abnormal Mass Or lumps Cardiovascular: S1 and S2 Present, no Murmur, Peripheral Pulses Present Respiratory: normal respiratory effort, Bilateral Air entry equal and Decreased, no use of accessory muscle, Clear to Auscultation, no Crackles, no wheezes Abdomen: Bowel Sound present, Soft and no tenderness, no hernia Skin: no redness, no Rash, no induration Extremities: no Pedal edema, no calf tenderness, ROM limited on right wrist Neurologic: Grossly no focal neuro deficit. Bilaterally Equal motor strength  Data Reviewed: CBC: Recent Labs  Lab 11/17/17 0415 11/17/17 0429 11/18/17 0318  WBC 36.4*  --  36.4*  NEUTROABS 6.6  --   --   HGB 10.9* 10.2* 9.7*  HCT 31.1* 30.0* 27.6*  MCV 89.4  --  87.9  PLT 210  --  389   Basic Metabolic Panel: Recent Labs  Lab 11/17/17 0415 11/17/17 0429 11/17/17 1047 11/18/17 0318  NA 136 136  --  137  K 4.5 4.4  --  4.4  CL 100* 104  --  103  CO2 23  --   --  22  GLUCOSE 107* 106*  --  152*  BUN 43* 40*  --  50*  CREATININE 6.31* 6.30*  --  5.96*  CALCIUM 14.0*  --   --  11.5*  MG  --   --  2.7*  --   PHOS  --   --  4.8*  --     Liver Function Tests: Recent Labs  Lab 11/17/17 0415 11/18/17 0318  AST 41 26  ALT 31 26  ALKPHOS 78 63  BILITOT 0.5 0.5  PROT 7.3 6.4*  ALBUMIN 3.9 3.5   No results for input(s): LIPASE, AMYLASE in the last 168 hours. No results for input(s): AMMONIA in the last 168 hours. Coagulation Profile: Recent Labs  Lab 11/17/17 0415  INR 0.99   Cardiac Enzymes: No results for input(s): CKTOTAL, CKMB, CKMBINDEX, TROPONINI in the last 168 hours. BNP (last 3 results) No results for input(s): PROBNP in the last 8760  hours. CBG: Recent Labs  Lab 11/17/17 0933  GLUCAP 103*   Studies: Ct Femur Left Wo Contrast  Result Date: 11/18/2017 CLINICAL DATA:  Bilateral upper leg pain, worse on the right for 2 weeks. Diffuse weakness. Lytic lesions in the femurs on osseous survey. EXAM: CT OF THE LOWER LEFT EXTREMITY WITHOUT CONTRAST TECHNIQUE: Multidetector CT imaging of the lower left extremity was performed according to the standard protocol. COMPARISON:  Osseous survey 11/17/2017. FINDINGS: Bones/Joint/Cartilage No fracture is identified on the right or left. Multiple foci of endosteal scalloping are seen in the femurs bilaterally consistent with lytic lesions on plain films. Tumor burden appears fairly symmetric on the right and left. Lytic lesions are also identified in the right and left acetabuli. On the left, cortical disruption is seen adjacent to the femoral head in the anterior wall measuring 1.4 cm AP, extending 0.9 cm into bone medially and measuring 2.2 cm craniocaudal. The inner cortical wall is not disrupted. On the right, lysis extends across the medial wall of the acetabulum where there is cortical destruction adjacent to the femoral head measuring 3.5 cm AP AP by 2.0 cm  craniocaudal. The inner cortical wall is not disrupted. Multiple foci of fatty marrow replacement are identified in both femurs consistent with tumor deposits. Ligaments Suboptimally assessed by CT. Muscles and Tendons Intact and normal appearance. Soft tissues No acute abnormality. IMPRESSION: Negative for fracture. Multiple lytic lesions are present throughout both femurs with an appearance most consistent with multiple myeloma. Lytic lesions are also seen in the walls of the acetabuli bilaterally most consistent with multiple myeloma. As described above, the inner cortical wall of both the right and left acetabulum is preserved with destruction of the outer cortical wall identified. Electronically Signed   By: Inge Rise M.D.   On:  11/18/2017 10:24   Ct Femur Right Wo Contrast  Result Date: 11/18/2017 CLINICAL DATA:  Bilateral upper leg pain, worse on the right for 2 weeks. Diffuse weakness. Lytic lesions in the femurs on osseous survey. EXAM: CT OF THE LOWER LEFT EXTREMITY WITHOUT CONTRAST TECHNIQUE: Multidetector CT imaging of the lower left extremity was performed according to the standard protocol. COMPARISON:  Osseous survey 11/17/2017. FINDINGS: Bones/Joint/Cartilage No fracture is identified on the right or left. Multiple foci of endosteal scalloping are seen in the femurs bilaterally consistent with lytic lesions on plain films. Tumor burden appears fairly symmetric on the right and left. Lytic lesions are also identified in the right and left acetabuli. On the left, cortical disruption is seen adjacent to the femoral head in the anterior wall measuring 1.4 cm AP, extending 0.9 cm into bone medially and measuring 2.2 cm craniocaudal. The inner cortical wall is not disrupted. On the right, lysis extends across the medial wall of the acetabulum where there is cortical destruction adjacent to the femoral head measuring 3.5 cm AP AP by 2.0 cm craniocaudal. The inner cortical wall is not disrupted. Multiple foci of fatty marrow replacement are identified in both femurs consistent with tumor deposits. Ligaments Suboptimally assessed by CT. Muscles and Tendons Intact and normal appearance. Soft tissues No acute abnormality. IMPRESSION: Negative for fracture. Multiple lytic lesions are present throughout both femurs with an appearance most consistent with multiple myeloma. Lytic lesions are also seen in the walls of the acetabuli bilaterally most consistent with multiple myeloma. As described above, the inner cortical wall of both the right and left acetabulum is preserved with destruction of the outer cortical wall identified. Electronically Signed   By: Inge Rise M.D.   On: 11/18/2017 10:24    Scheduled Meds: . calcitonin  4  Units/kg Subcutaneous BID  . dexamethasone  10 mg Intravenous Q6H  . heparin  5,000 Units Subcutaneous Q8H  . sodium chloride flush  3 mL Intravenous Q12H   Continuous Infusions: . sodium chloride    . pamidronate     PRN Meds: acetaminophen **OR** acetaminophen, hydrALAZINE, morphine injection, ondansetron **OR** ondansetron (ZOFRAN) IV, oxyCODONE  Time spent: 35 minutes  Author: Berle Mull, MD [facility] Pager: [phone] 11/18/2017 1:28 PM  If 7PM-7AM, please contact night-coverage at [email or web address], password [facility]

## 2017-11-18 NOTE — Progress Notes (Signed)
[facility] Progress Note  Subjective: Ca better, pt w/o any new c/o.  No n/v/d.  Vitals:   11/17/17 2340 11/18/17 0440 11/18/17 0500 11/18/17 0551  BP: 124/80   123/76  Pulse: 76     Resp: 19 (!) 22  17  Temp: 98.3 F (36.8 C)   98.1 F (36.7 C)  TempSrc: Oral   Oral  SpO2: 100%   98%  Weight:   64.8 kg (142 lb 14.4 oz)   Height:        Inpatient medications: . calcitonin  4 Units/kg Subcutaneous BID  . dexamethasone  10 mg Intravenous Q6H  . heparin  5,000 Units Subcutaneous Q8H  . sodium chloride flush  3 mL Intravenous Q12H   . sodium chloride    . pamidronate     acetaminophen **OR** acetaminophen, hydrALAZINE, morphine injection, ondansetron **OR** ondansetron (ZOFRAN) IV, oxyCODONE  Exam: Gen thin AAM, no distress No jvd or bruits Chest clear bilat RRR no MRG Abd soft ntnd no mass or ascites +bs  Ext no LE edema Neuro is alert, Ox 3 , nf    Impression: 1. 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 IR consulted for BM biopsy, according to what the pt tells me. 2. Vol - no vol excess 3. Hypercalcemia - improving, resume NS at 125 /hr and calcitonin for another day.  Also got 30 mg pamidronate.  4. Tobacco use 5. BPH 6. Anemia due to CKD/ chronic disease - Hb 9-10 range, follow.   Plan - will resume IVF"s.     Kelly Splinter MD [facility] pager [phone]   11/18/2017, 4:41 PM   Recent Labs  Lab 11/17/17 0415 11/17/17 0429 11/17/17 1047 11/18/17 0318  NA 136 136  --  137  K 4.5 4.4  --  4.4  CL 100* 104  --  103  CO2 23  --   --  22  GLUCOSE 107* 106*  --  152*  BUN 43* 40*  --  50*  CREATININE 6.31* 6.30*  --  5.96*  CALCIUM 14.0*  --   --  11.5*  PHOS  --   --  4.8*  --    Recent Labs  Lab 11/17/17 0415 11/18/17 0318  AST 41 26  ALT 31 26  ALKPHOS 78 63  BILITOT 0.5 0.5  PROT 7.3 6.4*  ALBUMIN 3.9 3.5    Recent Labs  Lab 11/17/17 0415 11/17/17 0429 11/18/17 0318  WBC 36.4*  --  36.4*  NEUTROABS 6.6  --   --   HGB 10.9* 10.2* 9.7*  HCT 31.1* 30.0* 27.6*  MCV 89.4  --  87.9  PLT 210  --  193   Iron/TIBC/Ferritin/ %Sat No results found for: IRON, TIBC, FERRITIN, IRONPCTSAT

## 2017-11-18 NOTE — Progress Notes (Signed)
Echocardiogram 2D Echocardiogram has been performed.  Andres Fernandez 11/18/2017, 4:09 PM

## 2017-11-19 LAB — CBC WITH DIFFERENTIAL/PLATELET
BASOS PCT: 0 %
BLASTS: 0 %
Band Neutrophils: 0 %
Basophils Absolute: 0 10*3/uL (ref 0.0–0.1)
EOS ABS: 0 10*3/uL (ref 0.0–0.7)
Eosinophils Relative: 0 %
HCT: 26.8 % — ABNORMAL LOW (ref 39.0–52.0)
Hemoglobin: 9.4 g/dL — ABNORMAL LOW (ref 13.0–17.0)
LYMPHS PCT: 80 %
Lymphs Abs: 30.9 10*3/uL — ABNORMAL HIGH (ref 0.7–4.0)
MCH: 31.5 pg (ref 26.0–34.0)
MCHC: 35.1 g/dL (ref 30.0–36.0)
MCV: 89.9 fL (ref 78.0–100.0)
MYELOCYTES: 0 %
Metamyelocytes Relative: 0 %
Monocytes Absolute: 1.9 10*3/uL — ABNORMAL HIGH (ref 0.1–1.0)
Monocytes Relative: 5 %
NEUTROS PCT: 15 %
NRBC: 0 /100{WBCs}
Neutro Abs: 5.8 10*3/uL (ref 1.7–7.7)
PLATELETS: 200 10*3/uL (ref 150–400)
PROMYELOCYTES ABS: 0 %
RBC: 2.98 MIL/uL — ABNORMAL LOW (ref 4.22–5.81)
RDW: 13.8 % (ref 11.5–15.5)
WBC: 38.6 10*3/uL — AB (ref 4.0–10.5)

## 2017-11-19 LAB — COMPREHENSIVE METABOLIC PANEL
ALT: 23 U/L (ref 17–63)
ANION GAP: 10 (ref 5–15)
AST: 21 U/L (ref 15–41)
Albumin: 3.4 g/dL — ABNORMAL LOW (ref 3.5–5.0)
Alkaline Phosphatase: 61 U/L (ref 38–126)
BUN: 47 mg/dL — ABNORMAL HIGH (ref 6–20)
CHLORIDE: 105 mmol/L (ref 101–111)
CO2: 20 mmol/L — AB (ref 22–32)
Calcium: 9.8 mg/dL (ref 8.9–10.3)
Creatinine, Ser: 5.28 mg/dL — ABNORMAL HIGH (ref 0.61–1.24)
GFR calc non Af Amer: 11 mL/min — ABNORMAL LOW (ref >60)
GFR, EST AFRICAN AMERICAN: 12 mL/min — AB (ref >60)
Glucose, Bld: 159 mg/dL — ABNORMAL HIGH (ref 65–99)
POTASSIUM: 4.5 mmol/L (ref 3.5–5.1)
SODIUM: 135 mmol/L (ref 135–145)
Total Bilirubin: 0.4 mg/dL (ref 0.3–1.2)
Total Protein: 6.4 g/dL — ABNORMAL LOW (ref 6.5–8.1)

## 2017-11-19 LAB — PROTIME-INR
INR: 1.11
PROTHROMBIN TIME: 14.2 s (ref 11.4–15.2)

## 2017-11-19 LAB — MAGNESIUM: Magnesium: 2.3 mg/dL (ref 1.7–2.4)

## 2017-11-19 LAB — KAPPA/LAMBDA LIGHT CHAINS
KAPPA, LAMDA LIGHT CHAIN RATIO: 4764.5 — AB (ref 0.26–1.65)
Kappa free light chain: 27634.1 mg/L — ABNORMAL HIGH (ref 3.3–19.4)
Lambda free light chains: 5.8 mg/L (ref 5.7–26.3)

## 2017-11-19 LAB — PATHOLOGIST SMEAR REVIEW

## 2017-11-19 MED ORDER — CHLORHEXIDINE GLUCONATE 4 % EX LIQD
60.0000 mL | Freq: Once | CUTANEOUS | Status: AC
  Administered 2017-11-19: 4 via TOPICAL
  Filled 2017-11-19: qty 60

## 2017-11-19 MED ORDER — CEFAZOLIN SODIUM-DEXTROSE 2-4 GM/100ML-% IV SOLN
2.0000 g | INTRAVENOUS | Status: AC
  Administered 2017-11-20: 2 g via INTRAVENOUS
  Filled 2017-11-19: qty 100

## 2017-11-19 MED ORDER — CEFAZOLIN SODIUM-DEXTROSE 2-4 GM/100ML-% IV SOLN: 2.0000 g | INTRAVENOUS | Status: DC

## 2017-11-19 NOTE — Progress Notes (Signed)
Ortho Note   Due to scheduling conflicts we are moving Andres Fernandez's orthopedic surger to tomorrow at 1245.  He may have a diet today from my standpoint.  And will need to be NPO after MN tonight.   Andres Fernandez

## 2017-11-19 NOTE — Progress Notes (Signed)
Patient ID: Andres Fernandez, male   DOB: 12-Dec-1955, 61 y.o.   MRN: [id number] S: Feeling better O:BP 131/89 (BP Location: Right Arm)   Pulse (!) 101   Temp 98.6 F (37 C) (Oral)   Resp 17   Ht 5' 8" (1.727 m)   Wt 65 kg (143 lb 4.8 oz)   SpO2 100%   BMI 21.79 kg/m   Intake/Output Summary (Last 24 hours) at 11/19/2017 1441 Last data filed at 11/19/2017 0500 Gross per 24 hour  Intake 1105 ml  Output 800 ml  Net 305 ml   Intake/Output: I/O last 3 completed shifts: In: 3794 [P.O.:480; I.V.:625] Out: 800 [Urine:800]  Intake/Output this shift:  No intake/output data recorded. Weight change: 0.181 kg (6.4 oz) Gen: WN WN AAM in NAD CVS: no rub Resp:cta FEX:MDYJWL Ext:no edema  Recent Labs  Lab 11/17/17 0415 11/17/17 0429 11/17/17 1047 11/18/17 0318 11/19/17 0227  NA 136 136  --  137 135  K 4.5 4.4  --  4.4 4.5  CL 100* 104  --  103 105  CO2 23  --   --  22 20*  GLUCOSE 107* 106*  --  152* 159*  BUN 43* 40*  --  50* 47*  CREATININE 6.31* 6.30*  --  5.96* 5.28*  ALBUMIN 3.9  --   --  3.5 3.4*  CALCIUM 14.0*  --   --  11.5* 9.8  PHOS  --   --  4.8*  --   --   AST 41  --   --  26 21  ALT 31  --   --  26 23   Liver Function Tests: Recent Labs  Lab 11/17/17 0415 11/18/17 0318 11/19/17 0227  AST 41 26 21  ALT _0 ALKPHOS 78 63 61  BILITOT 0.5 0.5 0.4  PROT 7.3 6.4* 6.4*  ALBUMIN 3.9 3.5 3.4*   No results for input(s): LIPASE, AMYLASE in the last 168 hours. No results for input(s): AMMONIA in the last 168 hours. CBC: Recent Labs  Lab 11/17/17 0415 11/17/17 0429 11/18/17 0318 11/19/17 0227  WBC 36.4*  --  36.4* 38.6*  NEUTROABS 6.6  --   --  5.8  HGB 10.9* 10.2* 9.7* 9.4*  HCT 31.1* 30.0* 27.6* 26.8*  MCV 89.4  --  87.9 89.9  PLT 210  --  193 200   Cardiac Enzymes: No results for input(s): CKTOTAL, CKMB, CKMBINDEX, TROPONINI in the last 168 hours. CBG: Recent Labs  Lab 11/17/17 0933  GLUCAP 103*    Iron Studies: No results for input(s):  IRON, TIBC, TRANSFERRIN, FERRITIN in the last 72 hours. Studies/Results: Ct Femur Left Wo Contrast  Result Date: 11/18/2017 CLINICAL DATA:  Bilateral upper leg pain, worse on the right for 2 weeks. Diffuse weakness. Lytic lesions in the femurs on osseous survey. EXAM: CT OF THE LOWER LEFT EXTREMITY WITHOUT CONTRAST TECHNIQUE: Multidetector CT imaging of the lower left extremity was performed according to the standard protocol. COMPARISON:  Osseous survey 11/17/2017. FINDINGS: Bones/Joint/Cartilage No fracture is identified on the right or left. Multiple foci of endosteal scalloping are seen in the femurs bilaterally consistent with lytic lesions on plain films. Tumor burden appears fairly symmetric on the right and left. Lytic lesions are also identified in the right and left acetabuli. On the left, cortical disruption is seen adjacent to the femoral head in the anterior wall measuring 1.4 cm AP, extending 0.9 cm into bone medially and measuring 2.2 cm craniocaudal. The  inner cortical wall is not disrupted. On the right, lysis extends across the medial wall of the acetabulum where there is cortical destruction adjacent to the femoral head measuring 3.5 cm AP AP by 2.0 cm craniocaudal. The inner cortical wall is not disrupted. Multiple foci of fatty marrow replacement are identified in both femurs consistent with tumor deposits. Ligaments Suboptimally assessed by CT. Muscles and Tendons Intact and normal appearance. Soft tissues No acute abnormality. IMPRESSION: Negative for fracture. Multiple lytic lesions are present throughout both femurs with an appearance most consistent with multiple myeloma. Lytic lesions are also seen in the walls of the acetabuli bilaterally most consistent with multiple myeloma. As described above, the inner cortical wall of both the right and left acetabulum is preserved with destruction of the outer cortical wall identified. Electronically Signed   By: Inge Rise M.D.   On:  11/18/2017 10:24   Ct Femur Right Wo Contrast  Result Date: 11/18/2017 CLINICAL DATA:  Bilateral upper leg pain, worse on the right for 2 weeks. Diffuse weakness. Lytic lesions in the femurs on osseous survey. EXAM: CT OF THE LOWER LEFT EXTREMITY WITHOUT CONTRAST TECHNIQUE: Multidetector CT imaging of the lower left extremity was performed according to the standard protocol. COMPARISON:  Osseous survey 11/17/2017. FINDINGS: Bones/Joint/Cartilage No fracture is identified on the right or left. Multiple foci of endosteal scalloping are seen in the femurs bilaterally consistent with lytic lesions on plain films. Tumor burden appears fairly symmetric on the right and left. Lytic lesions are also identified in the right and left acetabuli. On the left, cortical disruption is seen adjacent to the femoral head in the anterior wall measuring 1.4 cm AP, extending 0.9 cm into bone medially and measuring 2.2 cm craniocaudal. The inner cortical wall is not disrupted. On the right, lysis extends across the medial wall of the acetabulum where there is cortical destruction adjacent to the femoral head measuring 3.5 cm AP AP by 2.0 cm craniocaudal. The inner cortical wall is not disrupted. Multiple foci of fatty marrow replacement are identified in both femurs consistent with tumor deposits. Ligaments Suboptimally assessed by CT. Muscles and Tendons Intact and normal appearance. Soft tissues No acute abnormality. IMPRESSION: Negative for fracture. Multiple lytic lesions are present throughout both femurs with an appearance most consistent with multiple myeloma. Lytic lesions are also seen in the walls of the acetabuli bilaterally most consistent with multiple myeloma. As described above, the inner cortical wall of both the right and left acetabulum is preserved with destruction of the outer cortical wall identified. Electronically Signed   By: Inge Rise M.D.   On: 11/18/2017 10:24   . dexamethasone  10 mg Intravenous  Q6H  . heparin  5,000 Units Subcutaneous Q8H  . sodium chloride flush  3 mL Intravenous Q12H    BMET    Component Value Date/Time   NA 135 11/19/2017 0227   K 4.5 11/19/2017 0227   CL 105 11/19/2017 0227   CO2 20 (L) 11/19/2017 0227   GLUCOSE 159 (H) 11/19/2017 0227   BUN 47 (H) 11/19/2017 0227   CREATININE 5.28 (H) 11/19/2017 0227   CALCIUM 9.8 11/19/2017 0227   GFRNONAA 11 (L) 11/19/2017 0227   GFRAA 12 (L) 11/19/2017 0227   CBC    Component Value Date/Time   WBC 38.6 (H) 11/19/2017 0227   RBC 2.98 (L) 11/19/2017 0227   HGB 9.4 (L) 11/19/2017 0227   HCT 26.8 (L) 11/19/2017 0227   PLT 200 11/19/2017 0227   MCV  89.9 11/19/2017 0227   MCH 31.5 11/19/2017 0227   MCHC 35.1 11/19/2017 0227   RDW 13.8 11/19/2017 0227   LYMPHSABS 30.9 (H) 11/19/2017 0227   MONOABS 1.9 (H) 11/19/2017 0227   EOSABS 0.0 11/19/2017 0227   BASOSABS 0.0 11/19/2017 0227     Assessment/Plan:  1. AKI- unclear baseline Scr as there are no other labs to compare.  Presented with weakness, anorexia, and leg pain found to have Ca of 14, Cr 6.3 and lytic lesions of bone survey concerning for multiple myeloma.  He is pending bone marrow biopsy today.  Scr has improved with treatment of hypercalcemia and IVF's.  Await BM biopsy.  Elevated beta-2 microglobulin.  SPEP/UPEP pending.  No baseline so could be chronic as he does take NSAIDs. 2. Hypercalcemia- as above 3. Anemia- as above, await BM bx.   Donetta Potts, MD [employer] [phone]

## 2017-11-19 NOTE — Progress Notes (Signed)
[facility] Progress Note  Patient: Andres Fernandez DHR:[id number]   PCP: Patient, No Pcp Per DOB: April 09, 1956   DOA: 11/17/2017   DOS: 11/19/2017   Date of Service: the patient was seen and examined on 11/19/2017  Subjective: no acute complains, I did show him his skeletal survey x rays  Brief hospital course: Pt. with PMH of BPH; admitted on 11/17/2017, presented with complaint of generalized weakness, was found to have acute kidney injury, hypercalcemia, skeletal lytic lesions concerning for multiple myeloma. Currently further plan is continue IV fluids.  Assessment and Plan:  Acute kidney injury -Patient presents with complaints of generalized tingling and weakness of extremities associated with malaise and nausea with laboratory data revealing acute renal failure with significant elevation in creatinine of 6.31 as well as other lab data concerning for underlying malignancy -Baseline renal function unknown although with no prior documented history of chronic kidney disease clearly much lower than the presenting creatinine of 6.31 -Continues to make urine -Unremarkable renal ultrasound -Nephrology consultation given suspected etiology to renal failure 2/2 underlying malignancy  -Follow nephrology recommendation, continue IV fluids for now.  Active Problems:   ?? Multiple myeloma vs other bone marrow malignancy/Hypercalcemia/Lytic bone lesions on xray -Patient presents with mild neurological symptoms without definitive evidence of focal neurological deficit -CT head reveals lytic lesions concerning for malignancy/multiple myeloma -Patient has normal serum total protein but elevated calcium 14 -Normal saline at 150 cc/h -Dexamethasone 10 mg IV every 6 hours -Pamidronate 30 mg IV x1 dose  -SPEP/UPEP -elevated Beta-2 microglobulin serum, kappa/lambda light chains. Pending lymphocyte subsets, flow cytometry -CT-guided bone marrow biopsy and aspiration as ordered by  oncologist -OxyIR for moderate pain and IV morphine for severe pain -Zofran for nausea    Elevated blood-pressure reading without diagnosis of hypertension - Denies prior history of hypertension - Current blood pressure somewhat elevated in the context of anxiety and pain - EKG does have criteria of LVH concerning for possible long-standing, undetected hypertension that is untreated - Echocardiogram - Hydralazine IV prn with later determination of appropriate antihypertensive medication after echocardiogram resulted; also need to await to determine if renal function can return to normal    Anemia - Likely related to underlying malignancy - Hemoglobin greater than 7.0 - Has not had CPE in greater than 12 months so we will check TSH as well as PSA    BPH (benign prostatic hyperplasia) - Patient reports apparent mild symptoms of BPH the past    Tobacco abuse - Cessation counseling offered - Patient reports smokes 1 pack over 2-day.  Diet: renal diet DVT Prophylaxis: subcutaneous Heparin  Advance goals of care discussion: full code  Family Communication: family was present at bedside, at the time of interview. The pt provided permission to discuss medical plan with the family. Opportunity was given to ask question and all questions were answered satisfactorily.   Disposition:  Discharge to home.  Consultants:oncology, nephrology, ortho Procedures: none  Antibiotics: Anti-infectives (From admission, onward)   Start     Dose/Rate Route Frequency Ordered Stop   11/20/17 1130  ceFAZolin (ANCEF) IVPB 2g/100 mL premix     2 g 200 mL/hr over 30 Minutes Intravenous To Short Stay 11/19/17 1722 11/21/17 1130   11/19/17 1200  ceFAZolin (ANCEF) IVPB 2g/100 mL premix  Status:  Discontinued     2 g 200 mL/hr over 30 Minutes Intravenous To Short Stay 11/19/17 0116 11/19/17 1722       Objective: Physical Exam: Vitals:   11/19/17 0045  11/19/17 0234 11/19/17 0515 11/19/17 1310  BP:   130/83 127/83 131/89  Pulse: 74 63 72 (!) 101  Resp: 18 (!) 21 (!) 22 17  Temp:  98.3 F (36.8 C)  98.6 F (37 C)  TempSrc:  Oral  Oral  SpO2: 98% 97% 100%   Weight:  65 kg (143 lb 4.8 oz)    Height:        Intake/Output Summary (Last 24 hours) at 11/19/2017 1746 Last data filed at 11/19/2017 0500 Gross per 24 hour  Intake 1105 ml  Output 800 ml  Net 305 ml   Filed Weights   11/17/17 0409 11/18/17 0500 11/19/17 0234  Weight: 71.2 kg (157 lb) 64.8 kg (142 lb 14.4 oz) 65 kg (143 lb 4.8 oz)   General: Alert, Awake and Oriented to Time, Place and Person. Appear in mild distress, affect appropriate Eyes: PERRL, Conjunctiva normal ENT: Oral Mucosa clear moist. Neck: no JVD, no Abnormal Mass Or lumps Cardiovascular: S1 and S2 Present, no Murmur, Peripheral Pulses Present Respiratory: normal respiratory effort, Bilateral Air entry equal and Decreased, no use of accessory muscle, Clear to Auscultation, no Crackles, no wheezes Abdomen: Bowel Sound present, Soft and no tenderness, no hernia Skin: no redness, no Rash, no induration Extremities: no Pedal edema, no calf tenderness, ROM limited on right wrist Neurologic: Grossly no focal neuro deficit. Bilaterally Equal motor strength  Data Reviewed: CBC: Recent Labs  Lab 11/17/17 0415 11/17/17 0429 11/18/17 0318 11/19/17 0227  WBC 36.4*  --  36.4* 38.6*  NEUTROABS 6.6  --   --  5.8  HGB 10.9* 10.2* 9.7* 9.4*  HCT 31.1* 30.0* 27.6* 26.8*  MCV 89.4  --  87.9 89.9  PLT 210  --  193 638   Basic Metabolic Panel: Recent Labs  Lab 11/17/17 0415 11/17/17 0429 11/17/17 1047 11/18/17 0318 11/19/17 0227  NA 136 136  --  137 135  K 4.5 4.4  --  4.4 4.5  CL 100* 104  --  103 105  CO2 23  --   --  22 20*  GLUCOSE 107* 106*  --  152* 159*  BUN 43* 40*  --  50* 47*  CREATININE 6.31* 6.30*  --  5.96* 5.28*  CALCIUM 14.0*  --   --  11.5* 9.8  MG  --   --  2.7*  --  2.3  PHOS  --   --  4.8*  --   --     Liver Function  Tests: Recent Labs  Lab 11/17/17 0415 11/18/17 0318 11/19/17 0227  AST 41 26 21  ALT 31 26 23   ALKPHOS 78 63 61  BILITOT 0.5 0.5 0.4  PROT 7.3 6.4* 6.4*  ALBUMIN 3.9 3.5 3.4*   No results for input(s): LIPASE, AMYLASE in the last 168 hours. No results for input(s): AMMONIA in the last 168 hours. Coagulation Profile: Recent Labs  Lab 11/17/17 0415 11/19/17 0227  INR 0.99 1.11   Cardiac Enzymes: No results for input(s): CKTOTAL, CKMB, CKMBINDEX, TROPONINI in the last 168 hours. BNP (last 3 results) No results for input(s): PROBNP in the last 8760 hours. CBG: Recent Labs  Lab 11/17/17 0933  GLUCAP 103*   Studies: No results found.  Scheduled Meds: . dexamethasone  10 mg Intravenous Q6H  . heparin  5,000 Units Subcutaneous Q8H  . sodium chloride flush  3 mL Intravenous Q12H   Continuous Infusions: . sodium chloride 125 mL/hr at 11/18/17 2252  . [START ON 11/20/2017]  ceFAZolin (ANCEF) IV     PRN Meds: acetaminophen **OR** acetaminophen, hydrALAZINE, morphine injection, ondansetron **OR** ondansetron (ZOFRAN) IV, oxyCODONE  Time spent: 35 minutes  Author: Berle Mull, MD [facility] Pager: [phone] 11/19/2017 5:46 PM  If 7PM-7AM, please contact night-coverage at [email or web address], password [facility]

## 2017-11-20 ENCOUNTER — Inpatient Hospital Stay (HOSPITAL_COMMUNITY): Payer: BLUE CROSS/BLUE SHIELD

## 2017-11-20 ENCOUNTER — Inpatient Hospital Stay (HOSPITAL_COMMUNITY): Payer: BLUE CROSS/BLUE SHIELD | Admitting: Certified Registered"

## 2017-11-20 ENCOUNTER — Encounter (HOSPITAL_COMMUNITY)
Admission: EM | Disposition: A | Discharge status: Home Health | Payer: Self-pay | Source: Home / Self Care | Attending: Internal Medicine

## 2017-11-20 ENCOUNTER — Encounter (HOSPITAL_COMMUNITY): Payer: Self-pay | Admitting: Critical Care Medicine

## 2017-11-20 DIAGNOSIS — M899 Disorder of bone, unspecified: Secondary | ICD-10-CM | POA: Diagnosis present

## 2017-11-20 HISTORY — PX: FEMUR IM NAIL: SHX1597

## 2017-11-20 LAB — RENAL FUNCTION PANEL
ALBUMIN: 3.1 g/dL — AB (ref 3.5–5.0)
ANION GAP: 8 (ref 5–15)
BUN: 51 mg/dL — AB (ref 6–20)
CHLORIDE: 107 mmol/L (ref 101–111)
CO2: 18 mmol/L — AB (ref 22–32)
Calcium: 8.9 mg/dL (ref 8.9–10.3)
Creatinine, Ser: 4.31 mg/dL — ABNORMAL HIGH (ref 0.61–1.24)
GFR calc Af Amer: 16 mL/min — ABNORMAL LOW (ref >60)
GFR calc non Af Amer: 14 mL/min — ABNORMAL LOW (ref >60)
GLUCOSE: 137 mg/dL — AB (ref 65–99)
PHOSPHORUS: 3.5 mg/dL (ref 2.5–4.6)
POTASSIUM: 4.7 mmol/L (ref 3.5–5.1)
Sodium: 133 mmol/L — ABNORMAL LOW (ref 135–145)

## 2017-11-20 LAB — SURGICAL PCR SCREEN
MRSA, PCR: NEGATIVE
STAPHYLOCOCCUS AUREUS: NEGATIVE

## 2017-11-20 LAB — CBC
HEMATOCRIT: 27 % — AB (ref 39.0–52.0)
HEMOGLOBIN: 9.5 g/dL — AB (ref 13.0–17.0)
MCH: 31.5 pg (ref 26.0–34.0)
MCHC: 35.2 g/dL (ref 30.0–36.0)
MCV: 89.4 fL (ref 78.0–100.0)
Platelets: 185 10*3/uL (ref 150–400)
RBC: 3.02 MIL/uL — ABNORMAL LOW (ref 4.22–5.81)
RDW: 13.9 % (ref 11.5–15.5)
WBC: 31.7 10*3/uL — ABNORMAL HIGH (ref 4.0–10.5)

## 2017-11-20 LAB — TYPE AND SCREEN
ABO/RH(D): B POS
Antibody Screen: NEGATIVE

## 2017-11-20 LAB — ABO/RH: ABO/RH(D): B POS

## 2017-11-20 SURGERY — INSERTION, INTRAMEDULLARY ROD, FEMUR, RETROGRADE
Anesthesia: General | Site: Leg Upper | Laterality: Right

## 2017-11-20 MED ORDER — PHENYLEPHRINE 40 MCG/ML (10ML) SYRINGE FOR IV PUSH (FOR BLOOD PRESSURE SUPPORT)
PREFILLED_SYRINGE | INTRAVENOUS | Status: DC | PRN
  Administered 2017-11-20: 80 ug via INTRAVENOUS

## 2017-11-20 MED ORDER — HYDROMORPHONE HCL 1 MG/ML IJ SOLN
0.2500 mg | INTRAMUSCULAR | Status: DC | PRN
  Administered 2017-11-20 (×4): 0.5 mg via INTRAVENOUS

## 2017-11-20 MED ORDER — LIDOCAINE HCL 1 % IJ SOLN
INTRAMUSCULAR | Status: AC
  Filled 2017-11-20: qty 20

## 2017-11-20 MED ORDER — FENTANYL CITRATE (PF) 250 MCG/5ML IJ SOLN
INTRAMUSCULAR | Status: DC | PRN
  Administered 2017-11-20: 50 ug via INTRAVENOUS
  Administered 2017-11-20: 25 ug via INTRAVENOUS
  Administered 2017-11-20: 50 ug via INTRAVENOUS
  Administered 2017-11-20: 25 ug via INTRAVENOUS
  Administered 2017-11-20 (×2): 50 ug via INTRAVENOUS
  Administered 2017-11-20: 25 ug via INTRAVENOUS
  Administered 2017-11-20: 50 ug via INTRAVENOUS

## 2017-11-20 MED ORDER — FENTANYL CITRATE (PF) 100 MCG/2ML IJ SOLN
INTRAMUSCULAR | Status: AC
  Filled 2017-11-20: qty 4

## 2017-11-20 MED ORDER — 0.9 % SODIUM CHLORIDE (POUR BTL) OPTIME
TOPICAL | Status: DC | PRN
  Administered 2017-11-20: 1000 mL

## 2017-11-20 MED ORDER — FENTANYL CITRATE (PF) 100 MCG/2ML IJ SOLN
INTRAMUSCULAR | Status: AC | PRN
  Administered 2017-11-20: 25 ug via INTRAVENOUS
  Administered 2017-11-20: 50 ug via INTRAVENOUS

## 2017-11-20 MED ORDER — ROCURONIUM BROMIDE 10 MG/ML (PF) SYRINGE
PREFILLED_SYRINGE | INTRAVENOUS | Status: DC | PRN
  Administered 2017-11-20: 20 mg via INTRAVENOUS
  Administered 2017-11-20: 10 mg via INTRAVENOUS
  Administered 2017-11-20: 40 mg via INTRAVENOUS

## 2017-11-20 MED ORDER — ONDANSETRON HCL 4 MG/2ML IJ SOLN
INTRAMUSCULAR | Status: DC | PRN
  Administered 2017-11-20: 4 mg via INTRAVENOUS

## 2017-11-20 MED ORDER — PROPOFOL 10 MG/ML IV BOLUS
INTRAVENOUS | Status: AC
  Filled 2017-11-20: qty 20

## 2017-11-20 MED ORDER — HYDROMORPHONE HCL 1 MG/ML IJ SOLN
INTRAMUSCULAR | Status: AC
  Filled 2017-11-20: qty 1

## 2017-11-20 MED ORDER — MIDAZOLAM HCL 2 MG/2ML IJ SOLN
INTRAMUSCULAR | Status: AC | PRN
  Administered 2017-11-20: 1 mg via INTRAVENOUS
  Administered 2017-11-20: 0.5 mg via INTRAVENOUS

## 2017-11-20 MED ORDER — SODIUM CHLORIDE 0.9 % IV SOLN
INTRAVENOUS | Status: DC
  Administered 2017-11-20 (×3): via INTRAVENOUS

## 2017-11-20 MED ORDER — SODIUM CHLORIDE 0.9 % IV SOLN
INTRAVENOUS | Status: DC
  Administered 2017-11-20: 12:00:00 via INTRAVENOUS

## 2017-11-20 MED ORDER — MIDAZOLAM HCL 2 MG/2ML IJ SOLN
INTRAMUSCULAR | Status: AC
  Filled 2017-11-20: qty 4

## 2017-11-20 MED ORDER — SUGAMMADEX SODIUM 200 MG/2ML IV SOLN
INTRAVENOUS | Status: DC | PRN
  Administered 2017-11-20: 140 mg via INTRAVENOUS

## 2017-11-20 MED ORDER — FENTANYL CITRATE (PF) 250 MCG/5ML IJ SOLN
INTRAMUSCULAR | Status: AC
  Filled 2017-11-20: qty 5

## 2017-11-20 MED ORDER — MIDAZOLAM HCL 5 MG/5ML IJ SOLN
INTRAMUSCULAR | Status: DC | PRN
  Administered 2017-11-20: 2 mg via INTRAVENOUS

## 2017-11-20 MED ORDER — MIDAZOLAM HCL 2 MG/2ML IJ SOLN
INTRAMUSCULAR | Status: AC
  Filled 2017-11-20: qty 2

## 2017-11-20 MED ORDER — SODIUM CHLORIDE 0.9 % IV SOLN
INTRAVENOUS | Status: AC | PRN
  Administered 2017-11-20: 10 mL/h via INTRAVENOUS

## 2017-11-20 MED ORDER — ONDANSETRON HCL 4 MG/2ML IJ SOLN: 4.0000 mg | Freq: Once | INTRAMUSCULAR | Status: DC | PRN

## 2017-11-20 MED ORDER — PROPOFOL 10 MG/ML IV BOLUS
INTRAVENOUS | Status: DC | PRN
  Administered 2017-11-20: 10 mg via INTRAVENOUS
  Administered 2017-11-20: 130 mg via INTRAVENOUS

## 2017-11-20 MED ORDER — LIDOCAINE 2% (20 MG/ML) 5 ML SYRINGE
INTRAMUSCULAR | Status: DC | PRN
  Administered 2017-11-20: 60 mg via INTRAVENOUS

## 2017-11-20 MED ORDER — DEXAMETHASONE SODIUM PHOSPHATE 10 MG/ML IJ SOLN
INTRAMUSCULAR | Status: DC | PRN
  Administered 2017-11-20: 4 mg via INTRAVENOUS

## 2017-11-20 SURGICAL SUPPLY — 43 items
ALCOHOL 70% 16 OZ (MISCELLANEOUS) ×2 IMPLANT
BIT DRILL 4.2 (DRILL) ×1 IMPLANT
BNDG COHESIVE 4X5 TAN STRL (GAUZE/BANDAGES/DRESSINGS) ×2 IMPLANT
BNDG COHESIVE 6X5 TAN STRL LF (GAUZE/BANDAGES/DRESSINGS) ×2 IMPLANT
COVER PERINEAL POST (MISCELLANEOUS) ×2 IMPLANT
COVER SURGICAL LIGHT HANDLE (MISCELLANEOUS) ×2 IMPLANT
DRAPE C-ARMOR (DRAPES) ×2 IMPLANT
DRAPE HALF SHEET 40X57 (DRAPES) ×4 IMPLANT
DRAPE INCISE IOBAN 66X45 STRL (DRAPES) ×2 IMPLANT
DRAPE ORTHO SPLIT 77X108 STRL (DRAPES) ×4
DRAPE STERI IOBAN 125X83 (DRAPES) ×2 IMPLANT
DRAPE SURG ORHT 6 SPLT 77X108 (DRAPES) ×2 IMPLANT
DRILL 4.2 (DRILL) ×2
DRSG TEGADERM 2-3/8X2-3/4 SM (GAUZE/BANDAGES/DRESSINGS) IMPLANT
DURAPREP 26ML APPLICATOR (WOUND CARE) ×2 IMPLANT
ELECT REM PT RETURN 9FT ADLT (ELECTROSURGICAL) ×2
ELECTRODE REM PT RTRN 9FT ADLT (ELECTROSURGICAL) ×1 IMPLANT
FACESHIELD WRAPAROUND (MASK) ×2 IMPLANT
GAUZE SPONGE 4X4 12PLY STRL (GAUZE/BANDAGES/DRESSINGS) ×2 IMPLANT
GAUZE XEROFORM 1X8 LF (GAUZE/BANDAGES/DRESSINGS) ×2 IMPLANT
GLOVE BIO SURGEON STRL SZ7.5 (GLOVE) ×2 IMPLANT
GLOVE BIOGEL PI IND STRL 8 (GLOVE) ×1 IMPLANT
GLOVE BIOGEL PI INDICATOR 8 (GLOVE) ×1
GOWN STRL REUS W/ TWL LRG LVL3 (GOWN DISPOSABLE) ×1 IMPLANT
GOWN STRL REUS W/ TWL XL LVL3 (GOWN DISPOSABLE) ×1 IMPLANT
GOWN STRL REUS W/TWL LRG LVL3 (GOWN DISPOSABLE) ×2
GOWN STRL REUS W/TWL XL LVL3 (GOWN DISPOSABLE) ×2
GUIDEWIRE 3.2X400 (WIRE) ×4 IMPLANT
KIT BASIN OR (CUSTOM PROCEDURE TRAY) ×2 IMPLANT
KIT ROOM TURNOVER OR (KITS) ×2 IMPLANT
MANIFOLD NEPTUNE II (INSTRUMENTS) ×2 IMPLANT
NAIL TI CANN TFNA 11X400MM RT (Nail) ×2 IMPLANT
NS IRRIG 1000ML POUR BTL (IV SOLUTION) ×2 IMPLANT
PACK GENERAL/GYN (CUSTOM PROCEDURE TRAY) ×2 IMPLANT
PAD ARMBOARD 7.5X6 YLW CONV (MISCELLANEOUS) ×4 IMPLANT
REAMER ROD DEEP FLUTE 2.5X950 (INSTRUMENTS) ×2 IMPLANT
SCREW LOCKING 5.0X38MM (Screw) ×2 IMPLANT
SCREW RECON 6.5X100MM (Screw) ×2 IMPLANT
SCREW RECON 6.5X110 (Screw) ×2 IMPLANT
SPONGE GAUZE 4X4 12PLY STER LF (GAUZE/BANDAGES/DRESSINGS) ×2 IMPLANT
STAPLER VISISTAT 35W (STAPLE) ×2 IMPLANT
SUT VIC AB 2-0 CT1 27 (SUTURE) ×2
SUT VIC AB 2-0 CT1 TAPERPNT 27 (SUTURE) ×1 IMPLANT

## 2017-11-20 NOTE — Progress Notes (Signed)
Nutrition Brief Note  Patient identified on the Malnutrition Screening Tool (MST) Report.  Wt Readings from Last 15 Encounters:  11/20/17 144 lb 9.6 oz (65.6 kg)  04/24/14 168 lb (76.2 kg)   Spoke with pt prior to going to OR. Pt reports a good appetite. Denies unintentional weight loss.  Prior to NPO status, pt on a Regular diet consuming approximately 100% of meals.   Body mass index is 21.99 kg/m. Patient meets criteria for Normal based on current BMI.   Nutrition focused physical exam completed.  No muscle or subcutaneous fat depletion noticed.  No nutrition interventions warranted at this time. If nutrition issues arise, please consult RD.   Arthur Holms, RD, LDN Pager #: [phone] After-Hours Pager #: [phone]

## 2017-11-20 NOTE — Procedures (Signed)
Interventional Radiology Procedure Note  Procedure: CT guided aspirate and core biopsy of left iliac bone Complications: None Recommendations: - Bedrest supine x 1 hrs - OTC's PRN  Pain - Follow biopsy results  Signed,  Ersilia Brawley S. Cayleb Jarnigan, DO      

## 2017-11-20 NOTE — Progress Notes (Addendum)
Patient ID: Andres Fernandez, male   DOB: 1956/01/12, 61 y.o.   MRN: [id number] S: Just returned from CT bx of BM.  No complaints O:BP 113/80   Pulse 62   Temp 98.1 F (36.7 C) (Oral)   Resp 15   Ht 5' 8" (1.727 m)   Wt 65.6 kg (144 lb 9.6 oz)   SpO2 97%   BMI 21.99 kg/m   Intake/Output Summary (Last 24 hours) at 11/20/2017 1128 Last data filed at 11/20/2017 1100 Gross per 24 hour  Intake -  Output 2175 ml  Net -2175 ml   Intake/Output: I/O last 3 completed shifts: In: 500 [I.V.:500] Out: 2775 [Urine:2775]  Intake/Output this shift:  Total I/O In: -  Out: 200 [Urine:200] Weight change: 0.59 kg (1 lb 4.8 oz) Gen:WD WN in NAD CVS: no rub Resp:crackles at bases CBJ:SEGBTD Ext:no edema  Recent Labs  Lab 11/17/17 0415 11/17/17 0429 11/17/17 1047 11/18/17 0318 11/19/17 0227 11/20/17 0150  NA 136 136  --  137 135 133*  K 4.5 4.4  --  4.4 4.5 4.7  CL 100* 104  --  103 105 107  CO2 23  --   --  22 20* 18*  GLUCOSE 107* 106*  --  152* 159* 137*  BUN 43* 40*  --  50* 47* 51*  CREATININE 6.31* 6.30*  --  5.96* 5.28* 4.31*  ALBUMIN 3.9  --   --  3.5 3.4* 3.1*  CALCIUM 14.0*  --   --  11.5* 9.8 8.9  PHOS  --   --  4.8*  --   --  3.5  AST 41  --   --  26 21  --   ALT 31  --   --  26 23  --    Liver Function Tests: Recent Labs  Lab 11/17/17 0415 11/18/17 0318 11/19/17 0227 11/20/17 0150  AST 41 26 21  --   ALT _0 --   ALKPHOS 78 63 61  --   BILITOT 0.5 0.5 0.4  --   PROT 7.3 6.4* 6.4*  --   ALBUMIN 3.9 3.5 3.4* 3.1*   No results for input(s): LIPASE, AMYLASE in the last 168 hours. No results for input(s): AMMONIA in the last 168 hours. CBC: Recent Labs  Lab 11/17/17 0415  11/18/17 0318 11/19/17 0227 11/20/17 0150  WBC 36.4*  --  36.4* 38.6* 31.7*  NEUTROABS 6.6  --   --  5.8  --   HGB 10.9*   < > 9.7* 9.4* 9.5*  HCT 31.1*   < > 27.6* 26.8* 27.0*  MCV 89.4  --  87.9 89.9 89.4  PLT 210  --  193 200 185   < > = values in this interval not  displayed.   Cardiac Enzymes: No results for input(s): CKTOTAL, CKMB, CKMBINDEX, TROPONINI in the last 168 hours. CBG: Recent Labs  Lab 11/17/17 0933  GLUCAP 103*    Iron Studies: No results for input(s): IRON, TIBC, TRANSFERRIN, FERRITIN in the last 72 hours. Studies/Results: Ct Bone Marrow Biopsy & Aspiration  Result Date: 11/20/2017 INDICATION: 61 year old male with a history of multiple myeloma EXAM: CT BONE MARROW BIOPSY AND ASPIRATION MEDICATIONS: None. ANESTHESIA/SEDATION: Moderate (conscious) sedation was employed during this procedure. A total of Versed 2.0 mg and Fentanyl 2 Hunter mcg was administered intravenously. Moderate Sedation Time: 11 minutes. The patient's level of consciousness and vital signs were monitored continuously by radiology nursing throughout the procedure under my direct  supervision. FLUOROSCOPY TIME:  CT COMPLICATIONS: None PROCEDURE: The procedure risks, benefits, and alternatives were explained to the patient. Questions regarding the procedure were encouraged and answered. The patient understands and consents to the procedure. Scout CT of the pelvis was performed for surgical planning purposes. The posterior pelvis was prepped with chlorhexidinein a sterile fashion, and a sterile drape was applied covering the operative field. A sterile gown and sterile gloves were used for the procedure. Local anesthesia was provided with 1% Lidocaine. We targeted the left posterior iliac bone for biopsy. The skin and subcutaneous tissues were infiltrated with 1% lidocaine without epinephrine. A small stab incision was made with an 11 blade scalpel, and an 11 gauge Murphy needle was advanced with CT guidance to the posterior cortex. Manual forced was used to advance the needle through the posterior cortex and the stylet was removed. A bone marrow aspirate was retrieved and passed to a cytotechnologist in the room. The Murphy needle was then advanced without the stylet for a core  biopsy. The core biopsy was retrieved and also passed to a cytotechnologist. Manual pressure was used for hemostasis and a sterile dressing was placed. No complications were encountered no significant blood loss was encountered. Patient tolerated the procedure well and remained hemodynamically stable throughout. IMPRESSION: Status post CT-guided bone marrow biopsy, with tissue specimen sent to pathology for complete histopathologic analysis Signed, Dulcy Fanny. Earleen Newport, DO [facility] Radiology Electronically Signed   By: Corrie Mckusick D.O.   On: 11/20/2017 10:06   . dexamethasone  10 mg Intravenous Q6H  . fentaNYL      . heparin  5,000 Units Subcutaneous Q8H  . lidocaine      . midazolam      . sodium chloride flush  3 mL Intravenous Q12H    BMET    Component Value Date/Time   NA 133 (L) 11/20/2017 0150   K 4.7 11/20/2017 0150   CL 107 11/20/2017 0150   CO2 18 (L) 11/20/2017 0150   GLUCOSE 137 (H) 11/20/2017 0150   BUN 51 (H) 11/20/2017 0150   CREATININE 4.31 (H) 11/20/2017 0150   CALCIUM 8.9 11/20/2017 0150   GFRNONAA 14 (L) 11/20/2017 0150   GFRAA 16 (L) 11/20/2017 0150   CBC    Component Value Date/Time   WBC 31.7 (H) 11/20/2017 0150   RBC 3.02 (L) 11/20/2017 0150   HGB 9.5 (L) 11/20/2017 0150   HCT 27.0 (L) 11/20/2017 0150   PLT 185 11/20/2017 0150   MCV 89.4 11/20/2017 0150   MCH 31.5 11/20/2017 0150   MCHC 35.2 11/20/2017 0150   RDW 13.9 11/20/2017 0150   LYMPHSABS 30.9 (H) 11/19/2017 0227   MONOABS 1.9 (H) 11/19/2017 0227   EOSABS 0.0 11/19/2017 0227   BASOSABS 0.0 11/19/2017 0227     Assessment/Plan:  1. AKI- unclear baseline Scr as there are no other labs to compare.  Presented with weakness, anorexia, and leg pain found to have Ca of 14, Cr 6.3 and lytic lesions of bone survey concerning for multiple myeloma.  He is pending bone marrow biopsy today.  Scr has improved with treatment of hypercalcemia and IVF's.    1. Await BM biopsy.   2. Elevated beta-2 microglobulin and large kappa light chains .   3. SPEP/UPEP pending.   4. No baseline Scr so unclear of chronicity 5. Will need Hematology consult as this is most consistent with multiple myeloma 6. Instructed to stop NSAIDs. 2. Hypercalcemia- as above.  Will decrease IVF's and follow. 3. Anemia- as above, await BM bx.  4. Hyponatremia- due to #1 and will stop 0.45% NS and change to 0.9 NS at 31m/hr and follow.    JDonetta Potts MD [employer]([phone]

## 2017-11-20 NOTE — Transfer of Care (Signed)
Immediate Anesthesia Transfer of Care Note  Patient: Andres Fernandez  Procedure(s) Performed: RIGHT FEMORAL INTRAMEDULLARY (IM) NAIL (Right Leg Upper)  Patient Location: PACU  Anesthesia Type:General  Level of Consciousness: sedated  Airway & Oxygen Therapy: Patient Spontanous Breathing and Patient connected to nasal cannula oxygen  Post-op Assessment: Report given to RN and Post -op Vital signs reviewed and stable  Post vital signs: Reviewed and stable  Last Vitals:  Vitals:   11/20/17 0922 11/20/17 0931  BP: 125/73 113/80  Pulse: 61 62  Resp: 14 15  Temp:    SpO2: 97% 97%    Last Pain:  Vitals:   11/20/17 1048  TempSrc:   PainSc: 0-No pain         Complications: No apparent anesthesia complications

## 2017-11-20 NOTE — Op Note (Signed)
Date of Surgery: 11/20/2017  INDICATIONS: Mr. Vignola is a 61 y.o.-year-old male who presented to the emergency department with about 2 weeks of worsening appendicular skeletal pain.  This involved his bilateral upper and bilateral lower extremities.  He was noted on screening laboratory analysis as well as skeletal survey to have findings consistent with multiple myeloma.  Orthopedic surgery was consulted to assess for need of prophylactic fixation of any of his long bones.  On history and physical exam he was found to have quite a bit of pain in the right thigh as well as lytic lesions that involved more than 50% of the femoral cortex.  He was indicated for prophylactic stabilization of the right femur.  We discussed that he may develop bone pain and progressive lytic lesions of his left femur which would precipitate the need for a similar procedure on the left.  However, at this time that was not necessary.  Thus we recommended moving forward with intramedullary nailing of the right femur for impending pathologic fractures.  The risks and benefits of the procedure discussed with the patient prior to the procedure and all questions were answered; consent was obtained.  PREOPERATIVE DIAGNOSIS:  Right lytic bone lesion, impending femoral fracture.  POSTOPERATIVE DIAGNOSIS: Same  PROCEDURE:  right femur intramedullary nailing.    SURGEON: Geralynn Rile, M.D.  ASSISTANT: None,   ANESTHESIA:  general  IV FLUIDS AND URINE: See anesthesia record.  ESTIMATED BLOOD LOSS: 150 mL.  IMPLANTS:  Synthes antegrade trochanteric start recon femoral nail. 11 mm x 400 mm 133m and 100 mm proximal femoral neck screws 38 mm distal interlock by 5 mm.  DRAINS: None.  COMPLICATIONS: None.  DESCRIPTION OF PROCEDURE: The patient was brought to the operating room and placed supine on the operating table.  The patient's leg had been signed prior to the procedure and this was documented.  The patient had  the anesthesia placed by the anesthesiologist.  The prep verification and incision time-outs were performed to confirm that this was the correct patient, site, side and location. The patient had an SCD on the opposite lower extremity. The patient did receive antibiotics prior to the incision and was re-dosed during the procedure as needed at indicated intervals.  The patient had the lower extremity prepped and draped in the standard surgical fashion.  The contralateral leg was placed in a scissor position to remove it from the operative field and to prevent well leg compartment syndrome.    At the hip, the starting point was first found with the guide wire and this was inserted under fluoroscopic visualization. The guide wire was placed partially down into the femur and then the incision was made in the skin and subcutaneous tissue to the fascia and then the opening reamer was placed over this.  All radiographs were confirmed throughout the procedure on both AP and lateral views. The ball-tipped guide wire was then placed down to the distal portion of the femur in the proper location and the measuring guide was used to measure off of this after the femur was brought out to length. Sequential reaming was then performed to give some chatter.  First chatter was noted at 10 mm diameter reamer.  We then reamed up to a 12.5 mm reamer to accept a 11 mm diameter nail.  The nail itself was then inserted over the wire and then the guide wire was removed. The proximal interlocking screws were placed first starting with the inferior one to allow  for proper placement at the calcar.  Again, all radiographs were confirmed on both AP and lateral views.  The proximal screws were placed through the jig in the standard fashion, first incising the skin, subcutaneous tissue, and fascia, then spreading with a clamp.  The drill was placed and confirmed fluoroscopically, followed by measuring, then placing the screws by hand.  Attention  was then turned to the distal interlocking screws. The lateral x-ray was used to get the perfect circles view, then an incision was made through the skin and subcutaneous tissue and fascia followed by drilling, measuring with a depth gauge, then placing the screws by hand. Final x-rays were taken on both AP and lateral views to confirm all of the screw placements.  An internal rotation view was taken at the femoral neck to confirm integrity of the neck.  The wounds were copiously irrigated with saline and then the deep fascia was closed with 0 Vicryl figure-of-eight interrupted sutures. The skin was re-approximated with 2-0 Vicryl for the subcutaneous fat layer and staples for the skin.. The wounds were cleaned and dried a final time and a sterile dressing was placed. The patient was then transferred to a bed and left the operating room in stable condition.  All counts were correct at the end of the case.    POSTOPERATIVE PLAN: Mr. Joines will be WBAT and will return 2 weeks for suture removal;  he will not need any x-rays at that time.  Mr. Tukes will receive DVT prophylaxis based on other medications, activity level, and risk ratio of bleeding to thrombosis.  My recommendation will be for 6 weeks of daily aspirin 325 mg.

## 2017-11-20 NOTE — Anesthesia Postprocedure Evaluation (Signed)
Anesthesia Post Note  Patient: Andres Fernandez  Procedure(s) Performed: RIGHT FEMORAL INTRAMEDULLARY (IM) NAIL (Right Leg Upper)     Patient location during evaluation: PACU Anesthesia Type: General Level of consciousness: awake, awake and alert and oriented Pain management: pain level controlled Vital Signs Assessment: post-procedure vital signs reviewed and stable Respiratory status: spontaneous breathing, nonlabored ventilation and respiratory function stable Cardiovascular status: blood pressure returned to baseline Anesthetic complications: no    Last Vitals:  Vitals:   11/20/17 1600 11/20/17 1756  BP:  131/83  Pulse: (!) 59 61  Resp: 11 14  Temp:  (!) 36.3 C  SpO2: 97% 98%    Last Pain:  Vitals:   11/20/17 1756  TempSrc: Oral  PainSc:                  Brondon Wann COKER

## 2017-11-20 NOTE — Progress Notes (Signed)
[facility] Progress Note  Patient: Andres Fernandez   PCP: Patient, No Pcp Per DOB: 06-Feb-1956   DOA: 11/17/2017   DOS: 11/20/2017   Date of Service: the patient was seen and examined on 11/20/2017  Subjective: no acute complains, pain is well controlled.  Feels dehydrated.  No nausea no vomiting.  Brief hospital course: Pt. with PMH of BPH; admitted on 11/17/2017, presented with complaint of generalized weakness, was found to have acute kidney injury, hypercalcemia, skeletal lytic lesions concerning for multiple myeloma. Currently further plan is continue treating renal failure and hypercalcemia while working up on identifying malignancy.  Assessment and Plan:  Acute kidney injury -Patient presents with complaints of generalized tingling and weakness of extremities associated with malaise and nausea with laboratory data revealing acute renal failure with significant elevation in creatinine of 6.31 as well as other lab data concerning for underlying malignancy -Baseline renal function unknown although with no prior documented history of chronic kidney disease clearly much lower than the presenting creatinine of 6.31 -Continues to make urine -Unremarkable renal ultrasound other than medical renal disease. -Nephrology consultation given suspected etiology to renal failure 2/2 underlying malignancy  -Follow nephrology recommendation, continue IV fluids for now.  ?? Multiple myeloma vs other bone marrow malignancy Severe hypercalcemia Lytic bone lesions on xray -Patient presents with mild neurological symptoms without definitive evidence of focal neurological deficit -CT head reveals lytic lesions concerning for malignancy/multiple myeloma -Patient has normal serum total protein but elevated calcium 14 -Treated with aggressive IV hydration -Dexamethasone 10 mg IV every 6 hours -Pamidronate 30 mg IV x1 dose  -SPEP/UPEP -elevated Beta-2 microglobulin serum, kappa/lambda  light chains. Pending lymphocyte subsets, flow cytometry -Dr. Beatriz Chancellor from oncology consulted. -CT-guided bone marrow biopsy and aspiration as ordered by oncologist -OxyIR for moderate pain and IV morphine for severe pain -Zofran for nausea    Elevated blood-pressure reading without diagnosis of hypertension - Denies prior history of hypertension - Current blood pressure somewhat elevated in the context of anxiety and pain - EKG does have criteria of LVH concerning for possible long-standing, undetected hypertension that is untreated - Echocardiogram - Hydralazine IV prn with later determination of appropriate antihypertensive medication after echocardiogram resulted; also need to await to determine if renal function can return to normal    Anemia - Likely related to underlying malignancy - Hemoglobin greater than 7.0 - Has not had CPE in greater than 12 months so we will check TSH as well as PSA    BPH (benign prostatic hyperplasia) - Patient reports apparent mild symptoms of BPH the past    Tobacco abuse - Cessation counseling offered - Patient reports smokes 1 pack over 2-day.    History of substance abuse. Patient has history of substance abuse and was refusing to use any narcotics.  Given that the patient has bilateral hip involvement with lytic lesions as well as multiple bony involvement patient is more than likely going to require narcotic pain regimen going forward. For now monitor.  Diet: renal diet DVT Prophylaxis: subcutaneous Heparin  Advance goals of care discussion: full code  Family Communication: no family was present at bedside, at the time of interview.  Discussed with significant other on 11/18/2017 at bedside as well as niece on the same day on phone.  Disposition:  Discharge to home.  Consultants: oncology, nephrology, ortho Procedures: Right femoral intramedullary nail implant CT bone marrow biopsy and aspiration.  Left iliac  bone.  Antibiotics: Anti-infectives (From admission, onward)   Start  Dose/Rate Route Frequency Ordered Stop   11/20/17 1130  ceFAZolin (ANCEF) IVPB 2g/100 mL premix     2 g 200 mL/hr over 30 Minutes Intravenous To Short Stay 11/19/17 1722 11/20/17 1207   11/19/17 1200  ceFAZolin (ANCEF) IVPB 2g/100 mL premix  Status:  Discontinued     2 g 200 mL/hr over 30 Minutes Intravenous To Short Stay 11/19/17 0116 11/19/17 1722       Objective: Physical Exam: Vitals:   11/20/17 1433 11/20/17 1450 11/20/17 1456 11/20/17 1600  BP: 137/90 137/88    Pulse: 63  61 (!) 59  Resp: 13  12 11   Temp:      TempSrc:      SpO2: 95%  95% 97%  Weight:      Height:        Intake/Output Summary (Last 24 hours) at 11/20/2017 1632 Last data filed at 11/20/2017 1600 Gross per 24 hour  Intake 600 ml  Output 2325 ml  Net -1725 ml   Filed Weights   11/18/17 0500 11/19/17 0234 11/20/17 0422  Weight: 64.8 kg (142 lb 14.4 oz) 65 kg (143 lb 4.8 oz) 65.6 kg (144 lb 9.6 oz)   General: Alert, Awake and Oriented to Time, Place and Person. Appear in mild distress, affect appropriate Eyes: PERRL, Conjunctiva normal ENT: Oral Mucosa clear moist. Neck: no JVD, no Abnormal Mass Or lumps Cardiovascular: S1 and S2 Present, no Murmur, Peripheral Pulses Present Respiratory: normal respiratory effort, Bilateral Air entry equal and Decreased, no use of accessory muscle, Clear to Auscultation, no Crackles, no wheezes Abdomen: Bowel Sound present, Soft and no tenderness, no hernia Skin: no redness, no Rash, no induration Extremities: no Pedal edema, no calf tenderness, ROM limited on right wrist Neurologic: Grossly no focal neuro deficit. Bilaterally Equal motor strength  Data Reviewed: CBC: Recent Labs  Lab 11/17/17 0415 11/17/17 0429 11/18/17 0318 11/19/17 0227 11/20/17 0150  WBC 36.4*  --  36.4* 38.6* 31.7*  NEUTROABS 6.6  --   --  5.8  --   HGB 10.9* 10.2* 9.7* 9.4* 9.5*  HCT 31.1* 30.0* 27.6* 26.8*  27.0*  MCV 89.4  --  87.9 89.9 89.4  PLT 210  --  193 200 878   Basic Metabolic Panel: Recent Labs  Lab 11/17/17 0415 11/17/17 0429 11/17/17 1047 11/18/17 0318 11/19/17 0227 11/20/17 0150  NA 136 136  --  137 135 133*  K 4.5 4.4  --  4.4 4.5 4.7  CL 100* 104  --  103 105 107  CO2 23  --   --  22 20* 18*  GLUCOSE 107* 106*  --  152* 159* 137*  BUN 43* 40*  --  50* 47* 51*  CREATININE 6.31* 6.30*  --  5.96* 5.28* 4.31*  CALCIUM 14.0*  --   --  11.5* 9.8 8.9  MG  --   --  2.7*  --  2.3  --   PHOS  --   --  4.8*  --   --  3.5    Liver Function Tests: Recent Labs  Lab 11/17/17 0415 11/18/17 0318 11/19/17 0227 11/20/17 0150  AST 41 26 21  --   ALT 31 26 23   --   ALKPHOS 78 63 61  --   BILITOT 0.5 0.5 0.4  --   PROT 7.3 6.4* 6.4*  --   ALBUMIN 3.9 3.5 3.4* 3.1*   No results for input(s): LIPASE, AMYLASE in the last 168 hours. No results for input(s):  AMMONIA in the last 168 hours. Coagulation Profile: Recent Labs  Lab 11/17/17 0415 11/19/17 0227  INR 0.99 1.11   Cardiac Enzymes: No results for input(s): CKTOTAL, CKMB, CKMBINDEX, TROPONINI in the last 168 hours. BNP (last 3 results) No results for input(s): PROBNP in the last 8760 hours. CBG: Recent Labs  Lab 11/17/17 0933  GLUCAP 103*   Studies: Ct Bone Marrow Biopsy & Aspiration  Result Date: 11/20/2017 INDICATION: 61 year old male with a history of multiple myeloma EXAM: CT BONE MARROW BIOPSY AND ASPIRATION MEDICATIONS: None. ANESTHESIA/SEDATION: Moderate (conscious) sedation was employed during this procedure. A total of Versed 2.0 mg and Fentanyl 2 Hunter mcg was administered intravenously. Moderate Sedation Time: 11 minutes. The patient's level of consciousness and vital signs were monitored continuously by radiology nursing throughout the procedure under my direct supervision. FLUOROSCOPY TIME:  CT COMPLICATIONS: None PROCEDURE: The procedure risks, benefits, and alternatives were explained to the patient.  Questions regarding the procedure were encouraged and answered. The patient understands and consents to the procedure. Scout CT of the pelvis was performed for surgical planning purposes. The posterior pelvis was prepped with chlorhexidinein a sterile fashion, and a sterile drape was applied covering the operative field. A sterile gown and sterile gloves were used for the procedure. Local anesthesia was provided with 1% Lidocaine. We targeted the left posterior iliac bone for biopsy. The skin and subcutaneous tissues were infiltrated with 1% lidocaine without epinephrine. A small stab incision was made with an 11 blade scalpel, and an 11 gauge Murphy needle was advanced with CT guidance to the posterior cortex. Manual forced was used to advance the needle through the posterior cortex and the stylet was removed. A bone marrow aspirate was retrieved and passed to a cytotechnologist in the room. The Murphy needle was then advanced without the stylet for a core biopsy. The core biopsy was retrieved and also passed to a cytotechnologist. Manual pressure was used for hemostasis and a sterile dressing was placed. No complications were encountered no significant blood loss was encountered. Patient tolerated the procedure well and remained hemodynamically stable throughout. IMPRESSION: Status post CT-guided bone marrow biopsy, with tissue specimen sent to pathology for complete histopathologic analysis Signed, Dulcy Fanny. Earleen Newport, DO [facility] Radiology Electronically Signed   By: Corrie Mckusick D.O.   On: 11/20/2017 10:06   Dg C-arm 1-60 Min  Result Date: 11/20/2017 CLINICAL DATA:  Status post inter medullary nail and screw placement for impending right femur fracture due to metastatic disease. Multiple myeloma. EXAM: RIGHT FEMUR PORTABLE 2 VIEW; DG C-ARM 61-120 MIN COMPARISON:  CT scan of the femurs of November 17, 2017 FINDINGS: Fluoro time reported is 1 minutes, 24  seconds. Two fluoro spot images are observed. An intramedullary rod within the femoral shaft present. There are 2 telescoping screws in the right femoral neck and head and securing screw in the lower right femoral shaft. There is no immediate postprocedure complication. IMPRESSION: No immediate postprocedure complication following intramedullary nail placement of the right femur. Electronically Signed   By: David  Martinique M.D.   On: 11/20/2017 13:41   Dg Femur Port, Min 2 Views Right  Result Date: 11/20/2017 CLINICAL DATA:  Status post inter medullary nail and screw placement for impending right femur fracture due to metastatic disease. Multiple myeloma. EXAM: RIGHT FEMUR PORTABLE 2 VIEW; DG C-ARM 61-120 MIN COMPARISON:  CT scan of the femurs of November 17, 2017 FINDINGS: Fluoro time reported is 1 minutes, 24 seconds. Two fluoro  spot images are observed. An intramedullary rod within the femoral shaft present. There are 2 telescoping screws in the right femoral neck and head and securing screw in the lower right femoral shaft. There is no immediate postprocedure complication. IMPRESSION: No immediate postprocedure complication following intramedullary nail placement of the right femur. Electronically Signed   By: David  Martinique M.D.   On: 11/20/2017 13:41    Scheduled Meds: . dexamethasone  10 mg Intravenous Q6H  . fentaNYL      . heparin  5,000 Units Subcutaneous Q8H  . HYDROmorphone      . lidocaine      . midazolam      . sodium chloride flush  3 mL Intravenous Q12H   Continuous Infusions: . sodium chloride 50 mL/hr at 11/20/17 1530  . sodium chloride 10 mL/hr at 11/20/17 1142   PRN Meds: acetaminophen **OR** acetaminophen, hydrALAZINE, morphine injection, ondansetron **OR** ondansetron (ZOFRAN) IV, oxyCODONE  Time spent: 35 minutes  Author: Berle Mull, MD [facility] Pager: [phone] 11/20/2017 4:32 PM  If 7PM-7AM, please contact night-coverage at [email or web address], password  [facility]

## 2017-11-20 NOTE — Anesthesia Procedure Notes (Signed)
Procedure Name: Intubation Date/Time: 11/20/2017 12:01 PM Performed by: Wilburn Cornelia, CRNA Pre-anesthesia Checklist: Patient identified, Emergency Drugs available, Suction available, Patient being monitored and Timeout performed Patient Re-evaluated:Patient Re-evaluated prior to induction Oxygen Delivery Method: Circle system utilized Preoxygenation: Pre-oxygenation with 100% oxygen Induction Type: IV induction Ventilation: Mask ventilation without difficulty Laryngoscope Size: Mac and 4 Grade View: Grade I Tube type: Oral Tube size: 7.5 mm Number of attempts: 1 Airway Equipment and Method: Stylet Placement Confirmation: ETT inserted through vocal cords under direct vision,  positive ETCO2,  CO2 detector and breath sounds checked- equal and bilateral Secured at: 22 cm Tube secured with: Tape Dental Injury: Teeth and Oropharynx as per pre-operative assessment

## 2017-11-20 NOTE — Brief Op Note (Signed)
11/20/2017  1:34 PM  PATIENT:  Andres Fernandez  61 y.o. male  PRE-OPERATIVE DIAGNOSIS:  RIGHT PATHOLOGIC FEMUR FRACTURE  POST-OPERATIVE DIAGNOSIS:  RIGHT PATHOLOGIC FEMUR FRACTURE  PROCEDURE:  Procedure(s): RIGHT FEMORAL INTRAMEDULLARY (IM) NAIL (Right)  SURGEON:  Surgeon(s) and Role:    * Nicholes Stairs, MD - Primary  PHYSICIAN ASSISTANT:   ASSISTANTS: none   ANESTHESIA:   general  EBL:  150 mL   BLOOD ADMINISTERED:none  DRAINS: none   LOCAL MEDICATIONS USED:  NONE  SPECIMEN:  No Specimen  DISPOSITION OF SPECIMEN:  N/A  COUNTS:  YES  TOURNIQUET:  * No tourniquets in log *  DICTATION: .Note written in EPIC  PLAN OF CARE: Admit to inpatient   PATIENT DISPOSITION:  PACU - hemodynamically stable.   Delay start of Pharmacological VTE agent (>24hrs) due to surgical blood loss or risk of bleeding: not applicable

## 2017-11-20 NOTE — H&P (Signed)
H&P update  The surgical history has been reviewed and remains accurate without interval change.  The patient was re-examined and patient's physiologic condition has not changed significantly in the last 30 days. The condition still exists that makes this procedure necessary. The treatment plan remains the same, without new options for care.  No new pharmacological allergies or types of therapy has been initiated that would change the plan or the appropriateness of the plan.  The patient and/or family understand the potential benefits and risks.  Jason P. Stann Mainland, MD 11/20/2017 10:10 AM

## 2017-11-20 NOTE — Discharge Instructions (Signed)
Orthopedic surgery discharge instructions:  -Okay for full weightbearing as tolerated to the right lower extremity and left lower extremities. -Maintain postoperative bandages until your follow-up appointment in 2 weeks.  It is okay to shower with these in place, however do not submerge underwater. -For the prevention of blood clots take 1 full strength aspirin daily for 6 weeks. -Return to the office to see Dr. Stann Mainland in 2-3 weeks for postoperative wound check and staple removal.

## 2017-11-20 NOTE — Anesthesia Preprocedure Evaluation (Signed)
Anesthesia Evaluation  Patient identified by MRN, date of birth, ID band Patient awake    Reviewed: Allergy & Precautions, NPO status , Patient's Chart, lab work & pertinent test results  Airway Mallampati: II  TM Distance: >3 FB Neck ROM: Full    Dental  (+) Edentulous Lower, Edentulous Upper   Pulmonary Current Smoker,    breath sounds clear to auscultation       Cardiovascular  Rhythm:Regular Rate:Normal     Neuro/Psych    GI/Hepatic   Endo/Other    Renal/GU      Musculoskeletal   Abdominal   Peds  Hematology   Anesthesia Other Findings   Reproductive/Obstetrics                             Anesthesia Physical Anesthesia Plan  ASA: III  Anesthesia Plan: General   Post-op Pain Management:    Induction: Intravenous  PONV Risk Score and Plan: Ondansetron and Dexamethasone  Airway Management Planned: Oral ETT  Additional Equipment:   Intra-op Plan:   Post-operative Plan: Extubation in OR  Informed Consent: I have reviewed the patients History and Physical, chart, labs and discussed the procedure including the risks, benefits and alternatives for the proposed anesthesia with the patient or authorized representative who has indicated his/her understanding and acceptance.     Plan Discussed with: CRNA and Anesthesiologist  Anesthesia Plan Comments:         Anesthesia Quick Evaluation

## 2017-11-21 DIAGNOSIS — D649 Anemia, unspecified: Secondary | ICD-10-CM

## 2017-11-21 DIAGNOSIS — Z720 Tobacco use: Secondary | ICD-10-CM

## 2017-11-21 DIAGNOSIS — R030 Elevated blood-pressure reading, without diagnosis of hypertension: Secondary | ICD-10-CM

## 2017-11-21 LAB — UIFE/LIGHT CHAINS/TP QN, 24-HR UR
% BETA, URINE: 7.3 %
ALBUMIN, U: 1.6 %
ALPHA 1 URINE: 1.1 %
ALPHA 2 UR: 3.6 %
Free Lambda Lt Chains,Ur: 6.14 mg/L (ref 0.24–6.66)
GAMMA GLOBULIN URINE: 86.3 %
M-SPIKE %, Urine: 83.3 % — ABNORMAL HIGH
M-SPIKE, MG/24 HR: 6834 mg/(24.h) — AB
TOTAL PROTEIN, URINE-UPE24: 431.8 mg/dL
Total Protein, Urine-Ur/day: 8204 mg/24 hr — ABNORMAL HIGH (ref 30–150)
Total Volume: 1900

## 2017-11-21 LAB — RENAL FUNCTION PANEL
ANION GAP: 9 (ref 5–15)
Albumin: 3.1 g/dL — ABNORMAL LOW (ref 3.5–5.0)
BUN: 54 mg/dL — ABNORMAL HIGH (ref 6–20)
CHLORIDE: 109 mmol/L (ref 101–111)
CO2: 16 mmol/L — ABNORMAL LOW (ref 22–32)
Calcium: 8.7 mg/dL — ABNORMAL LOW (ref 8.9–10.3)
Creatinine, Ser: 3.89 mg/dL — ABNORMAL HIGH (ref 0.61–1.24)
GFR, EST AFRICAN AMERICAN: 18 mL/min — AB (ref >60)
GFR, EST NON AFRICAN AMERICAN: 15 mL/min — AB (ref >60)
Glucose, Bld: 131 mg/dL — ABNORMAL HIGH (ref 65–99)
POTASSIUM: 5 mmol/L (ref 3.5–5.1)
Phosphorus: 3.7 mg/dL (ref 2.5–4.6)
Sodium: 134 mmol/L — ABNORMAL LOW (ref 135–145)

## 2017-11-21 MED ORDER — DEXAMETHASONE SODIUM PHOSPHATE 10 MG/ML IJ SOLN
10.0000 mg | Freq: Two times a day (BID) | INTRAMUSCULAR | Status: DC
  Administered 2017-11-22: 10 mg via INTRAVENOUS
  Filled 2017-11-21: qty 1

## 2017-11-21 MED ORDER — TRAMADOL HCL 50 MG PO TABS
50.0000 mg | ORAL_TABLET | Freq: Two times a day (BID) | ORAL | Status: DC | PRN
  Administered 2017-11-21: 50 mg via ORAL
  Filled 2017-11-21: qty 1

## 2017-11-21 NOTE — Progress Notes (Signed)
PROGRESS NOTE [facility]   Lannie L Kapler   MRN:[id number] DOB: 09/27/1956  DOA: 11/17/2017 PCP: Patient, No Pcp Per   Brief Narrative:  Gregoire L Bobak is a 61 y/o M with PMH of BPH; admitted on 11/17/2017, presented with complaint of generalized weakness, was found to have acute kidney injury, hypercalcemia, skeletal lytic lesions concerning for multiple myeloma. Bone marrow biopsy was performed and awaiting results. AKI improving and hypocalcemia has resolved.   Subjective: Patient seen and examined, doing well, walking with PT. Pain is well controlled. No acute events overnight. Only concerns is muscle soreness at the R fore arm. Remains afebrile.   Assessment & Plan: Acute kidney injury Baseline Cr unknown, on admission Cr of 6.31, suspect is related to underlying malignancy.  Renal recommendations appreciated Cr slowly improving  US unremarkable Continue IV fluid for now  Good urine output  ?? Multiple myeloma vs other bone marrow malignancy/Hypercalcemia/Lytic bone lesions on xray Patient presents with mild neurological symptoms without definitive evidence of focal neurological deficit CT head reveals lytic lesions concerning for malignancy/multiple myeloma Patient was started on Dexamethasone 10 mg IV every 6 hours, will taper  Pamidronate30 mg IV x1 dose  HyperCa+ resolved  SPEP/UPEP pending  Elevated Beta-2 microglobulin serum, kappa/lambda light chains. Pending lymphocyte subsets, flow cytometry CT-guided bone marrow biopsy and aspiration pending results  Patient apprehensive with narcotics, due to hx of substance abuse, will give a trial of tramadol continue, morphine PRN   Zofran for nausea  Elevated blood-pressure reading without diagnosis of hypertension Denies prior history of hypertension Likely related to anxiety and pain  EKG and ECHO show LVH, concern for long standing hypertension, BP has been stable w/o antihypertensive.  Will continue to  monitor for now.  Continue hydralazine PRN  ECHO normal EF, LVH, Grade 1 DD   Anemia Likely due to malignancy  Hbg stable  Transfuse for hgb < 7 Check cbc in AM   Tobacco abuse Smoking cessation discussed    Pathologic fracture  S/p intramedullary nailing of R femur  PT recommending HH Pain is not expected to completely resolve post op as this pain is also related to malignancy  DVT prophylaxis: Heparin sq  Code Status: Full code  Family Communication: Family at bedside  Disposition Plan: Home in 2-3 days pending w/u   Consultants:   Oncology   Nephrology  IR  Orthopedic surgery  Procedures:   Intramedullary nailing 12/11  Antimicrobials: Anti-infectives (From admission, onward)   Start     Dose/Rate Route Frequency Ordered Stop   11/20/17 1130  ceFAZolin (ANCEF) IVPB 2g/100 mL premix     2 g 200 mL/hr over 30 Minutes Intravenous To Short Stay 11/19/17 1722 11/20/17 1207   11/19/17 1200  ceFAZolin (ANCEF) IVPB 2g/100 mL premix  Status:  Discontinued     2 g 200 mL/hr over 30 Minutes Intravenous To Short Stay 11/19/17 0116 11/19/17 1722        Objective: Vitals:   11/21/17 0600 11/21/17 0812 11/21/17 1100 11/21/17 1135  BP:  122/74  126/82  Pulse: 69 82  96  Resp: (!) 21 (!) 23  18  Temp:  98.1 F (36.7 C) 97.7 F (36.5 C) 97.7 F (36.5 C)  TempSrc:  Oral Oral Oral  SpO2: 97% 96%    Weight: 65.2 kg (143 lb 11.8 oz)     Height:        Intake/Output Summary (Last 24 hours) at 11/21/2017 1444 Last data filed at 11/21/2017 1200   Gross per 24 hour  Intake 1935 ml  Output 1300 ml  Net 635 ml   Filed Weights   11/19/17 0234 11/20/17 0422 11/21/17 0600  Weight: 65 kg (143 lb 4.8 oz) 65.6 kg (144 lb 9.6 oz) 65.2 kg (143 lb 11.8 oz)    Examination:  General exam: Appears calm and comfortable  HEENT: AC/AT, PERRLA, OP moist and clear Respiratory system: Clear to auscultation. No wheezes,crackle or rhonchi Cardiovascular system: S1 & S2  heard, RRR. No JVD, murmurs, rubs or gallops Gastrointestinal system: Abdomen is nondistended, soft and nontender. No organomegaly or masses felt. Normal bowel sounds heard. Central nervous system: Alert and oriented. No focal neurological deficits. Extremities: No pedal edema.   Skin: No rashes, lesions or ulcers Psychiatry: Judgement and insight appear normal. Mood & affect appropriate.    Data Reviewed: I have personally reviewed following labs and imaging studies  CBC: Recent Labs  Lab 11/17/17 0415 11/17/17 0429 11/18/17 0318 11/19/17 0227 11/20/17 0150  WBC 36.4*  --  36.4* 38.6* 31.7*  NEUTROABS 6.6  --   --  5.8  --   HGB 10.9* 10.2* 9.7* 9.4* 9.5*  HCT 31.1* 30.0* 27.6* 26.8* 27.0*  MCV 89.4  --  87.9 89.9 89.4  PLT 210  --  193 200 263   Basic Metabolic Panel: Recent Labs  Lab 11/17/17 0415 11/17/17 0429 11/17/17 1047 11/18/17 0318 11/19/17 0227 11/20/17 0150 11/21/17 0340  NA 136 136  --  137 135 133* 134*  K 4.5 4.4  --  4.4 4.5 4.7 5.0  CL 100* 104  --  103 105 107 109  CO2 23  --   --  22 20* 18* 16*  GLUCOSE 107* 106*  --  152* 159* 137* 131*  BUN 43* 40*  --  50* 47* 51* 54*  CREATININE 6.31* 6.30*  --  5.96* 5.28* 4.31* 3.89*  CALCIUM 14.0*  --   --  11.5* 9.8 8.9 8.7*  MG  --   --  2.7*  --  2.3  --   --   PHOS  --   --  4.8*  --   --  3.5 3.7   GFR: Estimated Creatinine Clearance: 18.6 mL/min (A) (by C-G formula based on SCr of 3.89 mg/dL (H)). Liver Function Tests: Recent Labs  Lab 11/17/17 0415 11/18/17 0318 11/19/17 0227 11/20/17 0150 11/21/17 0340  AST 41 26 21  --   --   ALT _0 --   --   ALKPHOS 78 63 61  --   --   BILITOT 0.5 0.5 0.4  --   --   PROT 7.3 6.4* 6.4*  --   --   ALBUMIN 3.9 3.5 3.4* 3.1* 3.1*   No results for input(s): LIPASE, AMYLASE in the last 168 hours. No results for input(s): AMMONIA in the last 168 hours. Coagulation Profile: Recent Labs  Lab 11/17/17 0415 11/19/17 0227  INR 0.99 1.11   Cardiac  Enzymes: No results for input(s): CKTOTAL, CKMB, CKMBINDEX, TROPONINI in the last 168 hours. BNP (last 3 results) No results for input(s): PROBNP in the last 8760 hours. HbA1C: No results for input(s): HGBA1C in the last 72 hours. CBG: Recent Labs  Lab 11/17/17 0933  GLUCAP 103*   Lipid Profile: No results for input(s): CHOL, HDL, LDLCALC, TRIG, CHOLHDL, LDLDIRECT in the last 72 hours. Thyroid Function Tests: No results for input(s): TSH, T4TOTAL, FREET4, T3FREE, THYROIDAB in the last 72 hours. Anemia Panel:  No results for input(s): VITAMINB12, FOLATE, FERRITIN, TIBC, IRON, RETICCTPCT in the last 72 hours. Sepsis Labs: No results for input(s): PROCALCITON, LATICACIDVEN in the last 168 hours.  Recent Results (from the past 240 hour(s))  Surgical pcr screen     Status: None   Collection Time: 11/20/17  6:15 AM  Result Value Ref Range Status   MRSA, PCR NEGATIVE NEGATIVE Final   Staphylococcus aureus NEGATIVE NEGATIVE Final    Comment: (NOTE) The Xpert SA Assay (FDA approved for NASAL specimens in patients 22 years of age and older), is one component of a comprehensive surveillance program. It is not intended to diagnose infection nor to guide or monitor treatment.       Radiology Studies: Ct Bone Marrow Biopsy & Aspiration  Result Date: 11/20/2017 INDICATION: 61-year-old male with a history of multiple myeloma EXAM: CT BONE MARROW BIOPSY AND ASPIRATION MEDICATIONS: None. ANESTHESIA/SEDATION: Moderate (conscious) sedation was employed during this procedure. A total of Versed 2.0 mg and Fentanyl 2 Hunter mcg was administered intravenously. Moderate Sedation Time: 11 minutes. The patient's level of consciousness and vital signs were monitored continuously by radiology nursing throughout the procedure under my direct supervision. FLUOROSCOPY TIME:  CT COMPLICATIONS: None PROCEDURE: The procedure risks, benefits, and alternatives were explained to the patient. Questions regarding the  procedure were encouraged and answered. The patient understands and consents to the procedure. Scout CT of the pelvis was performed for surgical planning purposes. The posterior pelvis was prepped with chlorhexidinein a sterile fashion, and a sterile drape was applied covering the operative field. A sterile gown and sterile gloves were used for the procedure. Local anesthesia was provided with 1% Lidocaine. We targeted the left posterior iliac bone for biopsy. The skin and subcutaneous tissues were infiltrated with 1% lidocaine without epinephrine. A small stab incision was made with an 11 blade scalpel, and an 11 gauge Murphy needle was advanced with CT guidance to the posterior cortex. Manual forced was used to advance the needle through the posterior cortex and the stylet was removed. A bone marrow aspirate was retrieved and passed to a cytotechnologist in the room. The Murphy needle was then advanced without the stylet for a core biopsy. The core biopsy was retrieved and also passed to a cytotechnologist. Manual pressure was used for hemostasis and a sterile dressing was placed. No complications were encountered no significant blood loss was encountered. Patient tolerated the procedure well and remained hemodynamically stable throughout. IMPRESSION: Status post CT-guided bone marrow biopsy, with tissue specimen sent to pathology for complete histopathologic analysis Signed, Jaime S. Wagner, DO [facility] Electronically Signed   By: Jaime  Wagner D.O.   On: 11/20/2017 10:06   Dg C-arm 1-60 Min  Result Date: 11/20/2017 CLINICAL DATA:  Status post inter medullary nail and screw placement for impending right femur fracture due to metastatic disease. Multiple myeloma. EXAM: RIGHT FEMUR PORTABLE 2 VIEW; DG C-ARM 61-120 MIN COMPARISON:  CT scan of the femurs of November 17, 2017 FINDINGS: Fluoro time reported is 1 minutes, 24 seconds. Two fluoro spot  images are observed. An intramedullary rod within the femoral shaft present. There are 2 telescoping screws in the right femoral neck and head and securing screw in the lower right femoral shaft. There is no immediate postprocedure complication. IMPRESSION: No immediate postprocedure complication following intramedullary nail placement of the right femur. Electronically Signed   By: David  Jordan M.D.   On: 11/20/2017 13:41   Dg Femur Port, Min   2 Views Right  Result Date: 11/20/2017 CLINICAL DATA:  Status post inter medullary nail and screw placement for impending right femur fracture due to metastatic disease. Multiple myeloma. EXAM: RIGHT FEMUR PORTABLE 2 VIEW; DG C-ARM 61-120 MIN COMPARISON:  CT scan of the femurs of November 17, 2017 FINDINGS: Fluoro time reported is 1 minutes, 24 seconds. Two fluoro spot images are observed. An intramedullary rod within the femoral shaft present. There are 2 telescoping screws in the right femoral neck and head and securing screw in the lower right femoral shaft. There is no immediate postprocedure complication. IMPRESSION: No immediate postprocedure complication following intramedullary nail placement of the right femur. Electronically Signed   By: David  Martinique M.D.   On: 11/20/2017 13:41      Scheduled Meds: . dexamethasone  10 mg Intravenous Q6H  . heparin  5,000 Units Subcutaneous Q8H  . sodium chloride flush  3 mL Intravenous Q12H   Continuous Infusions: . sodium chloride 50 mL/hr at 11/20/17 2309  . sodium chloride 10 mL/hr at 11/20/17 1142     LOS: 4 days    Time spent: Total of 25 minutes spent with pt, greater than 50% of which was spent in discussion of  treatment, counseling and coordination of care    Chipper Oman, MD Pager: Text Page via [email or web address]   If 7PM-7AM, please contact night-coverage [email or web address] 11/21/2017, 2:44 PM

## 2017-11-21 NOTE — Progress Notes (Signed)
Patient ID: Andres Fernandez, male   DOB: 1956/07/12, 61 y.o.   MRN: [id number] S: No new complaints, up walking in hallway with PT O:BP 122/74 (BP Location: Left Arm)   Pulse 82   Temp 98.1 F (36.7 C) (Oral)   Resp (!) 23   Ht _0  (1.727 m)   Wt 65.2 kg (143 lb 11.8 oz)   SpO2 96%   BMI 21.86 kg/m   Intake/Output Summary (Last 24 hours) at 11/21/2017 1137 Last data filed at 11/21/2017 0814 Gross per 24 hour  Intake 2535 ml  Output 1250 ml  Net 1285 ml   Intake/Output: I/O last 3 completed shifts: In: 2055 [P.O.:880; I.V.:1175] Out: 2575 [Urine:2425; Blood:150]  Intake/Output this shift:  Total I/O In: 480 [P.O.:480] Out: 150 [Urine:150] Weight change: -0.39 kg (-13.8 oz) Gen: NAD CVS: no rub Resp:cta JHE:[id number] Ext:no edema  Recent Labs  Lab 11/17/17 0415 11/17/17 0429 11/17/17 1047 11/18/17 0318 11/19/17 0227 11/20/17 0150 11/21/17 0340  NA 136 136  --  137 135 133* 134*  K 4.5 4.4  --  4.4 4.5 4.7 5.0  CL 100* 104  --  103 105 107 109  CO2 23  --   --  22 20* 18* 16*  GLUCOSE 107* 106*  --  152* 159* 137* 131*  BUN 43* 40*  --  50* 47* 51* 54*  CREATININE 6.31* 6.30*  --  5.96* 5.28* 4.31* 3.89*  ALBUMIN 3.9  --   --  3.5 3.4* 3.1* 3.1*  CALCIUM 14.0*  --   --  11.5* 9.8 8.9 8.7*  PHOS  --   --  4.8*  --   --  3.5 3.7  AST 41  --   --  26 21  --   --   ALT 31  --   --  26 23  --   --    Liver Function Tests: Recent Labs  Lab 11/17/17 0415 11/18/17 0318 11/19/17 0227 11/20/17 0150 11/21/17 0340  AST 41 26 21  --   --   ALT _1 --   --   ALKPHOS 78 63 61  --   --   BILITOT 0.5 0.5 0.4  --   --   PROT 7.3 6.4* 6.4*  --   --   ALBUMIN 3.9 3.5 3.4* 3.1* 3.1*   No results for input(s): LIPASE, AMYLASE in the last 168 hours. No results for input(s): AMMONIA in the last 168 hours. CBC: Recent Labs  Lab 11/17/17 0415  11/18/17 0318 11/19/17 0227 11/20/17 0150  WBC 36.4*  --  36.4* 38.6* 31.7*  NEUTROABS 6.6  --   --  5.8  --   HGB  10.9*   < > 9.7* 9.4* 9.5*  HCT 31.1*   < > 27.6* 26.8* 27.0*  MCV 89.4  --  87.9 89.9 89.4  PLT 210  --  193 200 185   < > = values in this interval not displayed.   Cardiac Enzymes: No results for input(s): CKTOTAL, CKMB, CKMBINDEX, TROPONINI in the last 168 hours. CBG: Recent Labs  Lab 11/17/17 0933  GLUCAP 103*    Iron Studies: No results for input(s): IRON, TIBC, TRANSFERRIN, FERRITIN in the last 72 hours. Studies/Results: Ct Bone Marrow Biopsy & Aspiration  Result Date: 11/20/2017 INDICATION: 62 year old male with a history of multiple myeloma EXAM: CT BONE MARROW BIOPSY AND ASPIRATION MEDICATIONS: None. ANESTHESIA/SEDATION: Moderate (conscious) sedation was employed during this procedure. A total  of Versed 2.0 mg and Fentanyl 2 Hunter mcg was administered intravenously. Moderate Sedation Time: 11 minutes. The patient's level of consciousness and vital signs were monitored continuously by radiology nursing throughout the procedure under my direct supervision. FLUOROSCOPY TIME:  CT COMPLICATIONS: None PROCEDURE: The procedure risks, benefits, and alternatives were explained to the patient. Questions regarding the procedure were encouraged and answered. The patient understands and consents to the procedure. Scout CT of the pelvis was performed for surgical planning purposes. The posterior pelvis was prepped with chlorhexidinein a sterile fashion, and a sterile drape was applied covering the operative field. A sterile gown and sterile gloves were used for the procedure. Local anesthesia was provided with 1% Lidocaine. We targeted the left posterior iliac bone for biopsy. The skin and subcutaneous tissues were infiltrated with 1% lidocaine without epinephrine. A small stab incision was made with an 11 blade scalpel, and an 11 gauge Murphy needle was advanced with CT guidance to the posterior cortex. Manual forced was used to advance the needle through the posterior cortex and the stylet was  removed. A bone marrow aspirate was retrieved and passed to a cytotechnologist in the room. The Murphy needle was then advanced without the stylet for a core biopsy. The core biopsy was retrieved and also passed to a cytotechnologist. Manual pressure was used for hemostasis and a sterile dressing was placed. No complications were encountered no significant blood loss was encountered. Patient tolerated the procedure well and remained hemodynamically stable throughout. IMPRESSION: Status post CT-guided bone marrow biopsy, with tissue specimen sent to pathology for complete histopathologic analysis Signed, Dulcy Fanny. Earleen Newport, DO [facility] Electronically Signed   By: Corrie Mckusick D.O.   On: 11/20/2017 10:06   Dg C-arm 1-60 Min  Result Date: 11/20/2017 CLINICAL DATA:  Status post inter medullary nail and screw placement for impending right femur fracture due to metastatic disease. Multiple myeloma. EXAM: RIGHT FEMUR PORTABLE 2 VIEW; DG C-ARM 61-120 MIN COMPARISON:  CT scan of the femurs of November 17, 2017 FINDINGS: Fluoro time reported is 1 minutes, 24 seconds. Two fluoro spot images are observed. An intramedullary rod within the femoral shaft present. There are 2 telescoping screws in the right femoral neck and head and securing screw in the lower right femoral shaft. There is no immediate postprocedure complication. IMPRESSION: No immediate postprocedure complication following intramedullary nail placement of the right femur. Electronically Signed   By: David  Martinique M.D.   On: 11/20/2017 13:41   Dg Femur Port, Min 2 Views Right  Result Date: 11/20/2017 CLINICAL DATA:  Status post inter medullary nail and screw placement for impending right femur fracture due to metastatic disease. Multiple myeloma. EXAM: RIGHT FEMUR PORTABLE 2 VIEW; DG C-ARM 61-120 MIN COMPARISON:  CT scan of the femurs of November 17, 2017 FINDINGS: Fluoro time reported is 1  minutes, 24 seconds. Two fluoro spot images are observed. An intramedullary rod within the femoral shaft present. There are 2 telescoping screws in the right femoral neck and head and securing screw in the lower right femoral shaft. There is no immediate postprocedure complication. IMPRESSION: No immediate postprocedure complication following intramedullary nail placement of the right femur. Electronically Signed   By: David  Martinique M.D.   On: 11/20/2017 13:41   . dexamethasone  10 mg Intravenous Q6H  . heparin  5,000 Units Subcutaneous Q8H  . sodium chloride flush  3 mL Intravenous Q12H    BMET    Component Value Date/Time  NA 134 (L) 11/21/2017 0340   K 5.0 11/21/2017 0340   CL 109 11/21/2017 0340   CO2 16 (L) 11/21/2017 0340   GLUCOSE 131 (H) 11/21/2017 0340   BUN 54 (H) 11/21/2017 0340   CREATININE 3.89 (H) 11/21/2017 0340   CALCIUM 8.7 (L) 11/21/2017 0340   GFRNONAA 15 (L) 11/21/2017 0340   GFRAA 18 (L) 11/21/2017 0340   CBC    Component Value Date/Time   WBC 31.7 (H) 11/20/2017 0150   RBC 3.02 (L) 11/20/2017 0150   HGB 9.5 (L) 11/20/2017 0150   HCT 27.0 (L) 11/20/2017 0150   PLT 185 11/20/2017 0150   MCV 89.4 11/20/2017 0150   MCH 31.5 11/20/2017 0150   MCHC 35.2 11/20/2017 0150   RDW 13.9 11/20/2017 0150   LYMPHSABS 30.9 (H) 11/19/2017 0227   MONOABS 1.9 (H) 11/19/2017 0227   EOSABS 0.0 11/19/2017 0227   BASOSABS 0.0 11/19/2017 0227     Assessment/Plan:  1. AKI- unclear baseline Scr as there are no other labs to compare. Presented with weakness, anorexia, and leg pain found to have Ca of 14, Cr 6.3 and lytic lesions of bone survey concerning for multiple myeloma. He is pending bone marrow biopsy today. Scr has improved with treatment of hypercalcemia and IVF's.  1. Await BM biopsy.  2. Elevated beta-2 microglobulin and large kappa light chains .  3. SPEP/UPEP pending.  4. No baseline Scr so unclear of chronicity 5. Needs Hematology consult as this is  most consistent with multiple myeloma 6. Instructed to stop NSAIDs. 2. Hypercalcemia- as above.  Will decrease IVF's and follow. 3. Anemia- as above, await BM bx. 4. Hyponatremia- due to #1 and will stop 0.45% NS and change to 0.9 NS at 33m/hr and follow. 5. Pathologic fx- s/p intramedullary nailing of right femur.   JDonetta Potts MD [employer]([phone]

## 2017-11-21 NOTE — Evaluation (Signed)
Physical Therapy Evaluation Patient Details Name: Andres Fernandez MRN: [id number] DOB: 10-22-1956 Today's Date: 11/21/2017   History of Present Illness  Pt adm with acute renal failure and found to have multiple lytic lesions suggestive of multiple myeloma vs other myeloid cancer. Pt underwent prophylactic stabilization of the right femur on 12/11 with IM nail. PMH - substance abuse history but now clean x 3 years. Does not want to use narcotics.  Clinical Impression  Pt admitted with above diagnosis and presents to PT with functional limitations due to deficits listed below (See PT problem list). Pt needs skilled PT to maximize independence and safety to allow discharge to fiancee's home. Expect pt will make steady progress.     Follow Up Recommendations Home health PT;Supervision - Intermittent    Equipment Recommendations  Rolling walker with 5" wheels    Recommendations for Other Services       Precautions / Restrictions Precautions Precautions: None Restrictions Weight Bearing Restrictions: Yes RLE Weight Bearing: Weight bearing as tolerated      Mobility  Bed Mobility Overal bed mobility: Needs Assistance Bed Mobility: Supine to Sit     Supine to sit: Min assist     General bed mobility comments: Assist to move RLE off EOB  Transfers Overall transfer level: Needs assistance Equipment used: Rolling walker (2 wheeled) Transfers: Sit to/from Stand Sit to Stand: Min assist         General transfer comment: Assist to bring hips up and for balance  Ambulation/Gait Ambulation/Gait assistance: Min guard;Supervision Ambulation Distance (Feet): 230 Feet Assistive device: Rolling walker (2 wheeled) Gait Pattern/deviations: Step-to pattern;Decreased stance time - right;Antalgic Gait velocity: decr Gait velocity interpretation: Below normal speed for age/gender General Gait Details: Initial min guard that progressed to supervision. Pt with slow, measured gait. Rt  hand painful due to pt weightbearing on walker  Stairs            Wheelchair Mobility    Modified Rankin (Stroke Patients Only)       Balance Overall balance assessment: No apparent balance deficits (not formally assessed)                                           Pertinent Vitals/Pain Pain Assessment: Faces Faces Pain Scale: Hurts little more Pain Location: RLE and rt hand Pain Descriptors / Indicators: Grimacing;Guarding Pain Intervention(s): Limited activity within patient's tolerance;Premedicated before session;Monitored during session    Home Living Family/patient expects to be discharged to:: Private residence Living Arrangements: Alone;Spouse/significant other Available Help at Discharge: Family;Friend(s)(Pt plans to stay with fiancee) Type of Home: House       Home Layout: Two level;Bed/bath upstairs(fiancee's home) Home Equipment: None Additional Comments: Pt plans to stay with his fiancee after DC.    Prior Function Level of Independence: Independent               Hand Dominance        Extremity/Trunk Assessment   Upper Extremity Assessment Upper Extremity Assessment: Overall WFL for tasks assessed    Lower Extremity Assessment Lower Extremity Assessment: RLE deficits/detail RLE Deficits / Details: Decr strength and knee flexion due to stiffness and soreness after surgery       Communication   Communication: No difficulties  Cognition Arousal/Alertness: Awake/alert Behavior During Therapy: WFL for tasks assessed/performed Overall Cognitive Status: Within Functional Limits for tasks assessed  General Comments      Exercises     Assessment/Plan    PT Assessment Patient needs continued PT services  PT Problem List Decreased strength;Decreased range of motion;Decreased mobility;Decreased knowledge of use of DME;Decreased knowledge of precautions;Pain        PT Treatment Interventions DME instruction;Gait training;Stair training;Functional mobility training;Therapeutic activities;Therapeutic exercise;Patient/family education    PT Goals (Current goals can be found in the Care Plan section)  Acute Rehab PT Goals Patient Stated Goal: return home PT Goal Formulation: With patient Time For Goal Achievement: 11/28/17 Potential to Achieve Goals: Good    Frequency Min 5X/week   Barriers to discharge Inaccessible home environment will stay with fiancee who has two story home    Co-evaluation               AM-PAC PT "6 Clicks" Daily Activity  Outcome Measure Difficulty turning over in bed (including adjusting bedclothes, sheets and blankets)?: A Little Difficulty moving from lying on back to sitting on the side of the bed? : Unable Difficulty sitting down on and standing up from a chair with arms (e.g., wheelchair, bedside commode, etc,.)?: Unable Help needed moving to and from a bed to chair (including a wheelchair)?: A Little Help needed walking in hospital room?: A Little Help needed climbing 3-5 steps with a railing? : A Little 6 Click Score: 14    End of Session Equipment Utilized During Treatment: Gait belt Activity Tolerance: Patient tolerated treatment well Patient left: in chair;with call bell/phone within reach;with chair alarm set Nurse Communication: Mobility status PT Visit Diagnosis: Other abnormalities of gait and mobility (R26.89);Pain Pain - Right/Left: Right Pain - part of body: Leg    Time: 1105-1140 PT Time Calculation (min) (ACUTE ONLY): 35 min   Charges:   PT Evaluation $PT Eval Moderate Complexity: 1 Mod PT Treatments $Gait Training: 8-22 mins   PT G CodesMarland Kitchen        [facility] [place] 11/21/2017, 12:33 PM

## 2017-11-21 NOTE — Progress Notes (Signed)
   Subjective:  Patient reports pain as moderate.  Pain is responsive to prns, but still having a lot of right femur and knee pain.  No n/v, sob/cp.    Objective:   VITALS:   Vitals:   11/21/17 0600 11/21/17 0812 11/21/17 1100 11/21/17 1135  BP:  122/74  126/82  Pulse: 69 82  96  Resp: (!) 21 (!) 23  18  Temp:  98.1 F (36.7 C) 97.7 F (36.5 C) 97.7 F (36.5 C)  TempSrc:  Oral Oral Oral  SpO2: 97% 96%    Weight: 143 lb 11.8 oz (65.2 kg)     Height:       RLE- Dressings c/d/i,  Mildly tender along entire thigh, more tenderness noted at the superolateral trochlea +NVI Motor and sensory intact distal, with 2+ DP  Lab Results  Component Value Date   WBC 31.7 (H) 11/20/2017   HGB 9.5 (L) 11/20/2017   HCT 27.0 (L) 11/20/2017   MCV 89.4 11/20/2017   PLT 185 11/20/2017   BMET    Component Value Date/Time   NA 134 (L) 11/21/2017 0340   K 5.0 11/21/2017 0340   CL 109 11/21/2017 0340   CO2 16 (L) 11/21/2017 0340   GLUCOSE 131 (H) 11/21/2017 0340   BUN 54 (H) 11/21/2017 0340   CREATININE 3.89 (H) 11/21/2017 0340   CALCIUM 8.7 (L) 11/21/2017 0340   GFRNONAA 15 (L) 11/21/2017 0340   GFRAA 18 (L) 11/21/2017 0340     Assessment/Plan: 1 Day Post-Op   Principal Problem:   Acute renal failure (ARF) (HCC) Active Problems:   ?? Multiple myeloma vs other bone marrow malignancy   Hypercalcemia   BPH (benign prostatic hyperplasia)   Tobacco abuse   Elevated blood-pressure reading without diagnosis of hypertension   Lytic bone lesions on xray   Anemia   Lytic bone lesion of right femur   - WBAT BLE and BUE - maintain post op dressings until follow up - some pain likely related to surgery and should improve with time, though pre-op pain likely to remain even after surgical pain subsides.   - SCDs and heparin for DVT  ppx    Nicholes Stairs 11/21/2017, 4:12 PM   Geralynn Rile, MD [phone]

## 2017-11-22 ENCOUNTER — Other Ambulatory Visit: Payer: Self-pay | Admitting: Oncology

## 2017-11-22 ENCOUNTER — Telehealth: Payer: Self-pay | Admitting: Hematology

## 2017-11-22 ENCOUNTER — Encounter (HOSPITAL_COMMUNITY): Payer: Self-pay | Admitting: Orthopedic Surgery

## 2017-11-22 DIAGNOSIS — I10 Essential (primary) hypertension: Secondary | ICD-10-CM

## 2017-11-22 LAB — DIFFERENTIAL
BAND NEUTROPHILS: 0 %
BASOS ABS: 0 10*3/uL (ref 0.0–0.1)
Basophils Relative: 0 %
Blasts: 0 %
EOS ABS: 0 10*3/uL (ref 0.0–0.7)
Eosinophils Relative: 0 %
LYMPHS PCT: 61 %
Lymphs Abs: 26.4 10*3/uL — ABNORMAL HIGH (ref 0.7–4.0)
METAMYELOCYTES PCT: 0 %
MONO ABS: 0 10*3/uL — AB (ref 0.1–1.0)
MYELOCYTES: 0 %
Monocytes Relative: 0 %
NEUTROS ABS: 16.8 10*3/uL — AB (ref 1.7–7.7)
NEUTROS PCT: 39 %
Other: 0 %
PROMYELOCYTES ABS: 0 %
nRBC: 3 /100 WBC — ABNORMAL HIGH

## 2017-11-22 LAB — PROTEIN ELECTROPHORESIS, SERUM
A/G Ratio: 1.1 (ref 0.7–1.7)
ALPHA-2-GLOBULIN: 1.3 g/dL — AB (ref 0.4–1.0)
Albumin ELP: 3.7 g/dL (ref 2.9–4.4)
Alpha-1-Globulin: 0.3 g/dL (ref 0.0–0.4)
BETA GLOBULIN: 1.1 g/dL (ref 0.7–1.3)
GAMMA GLOBULIN: 0.9 g/dL (ref 0.4–1.8)
Globulin, Total: 3.5 g/dL (ref 2.2–3.9)
Total Protein ELP: 7.2 g/dL (ref 6.0–8.5)

## 2017-11-22 LAB — CBC
HCT: 23.1 % — ABNORMAL LOW (ref 39.0–52.0)
HEMOGLOBIN: 8.3 g/dL — AB (ref 13.0–17.0)
MCH: 31.6 pg (ref 26.0–34.0)
MCHC: 35.9 g/dL (ref 30.0–36.0)
MCV: 87.8 fL (ref 78.0–100.0)
Platelets: 179 10*3/uL (ref 150–400)
RBC: 2.63 MIL/uL — ABNORMAL LOW (ref 4.22–5.81)
RDW: 13.9 % (ref 11.5–15.5)
WBC: 43.2 10*3/uL — ABNORMAL HIGH (ref 4.0–10.5)

## 2017-11-22 LAB — RENAL FUNCTION PANEL
ANION GAP: 9 (ref 5–15)
Albumin: 3 g/dL — ABNORMAL LOW (ref 3.5–5.0)
BUN: 54 mg/dL — ABNORMAL HIGH (ref 6–20)
CALCIUM: 8.5 mg/dL — AB (ref 8.9–10.3)
CO2: 16 mmol/L — AB (ref 22–32)
CREATININE: 3.65 mg/dL — AB (ref 0.61–1.24)
Chloride: 113 mmol/L — ABNORMAL HIGH (ref 101–111)
GFR calc Af Amer: 19 mL/min — ABNORMAL LOW (ref >60)
GFR calc non Af Amer: 17 mL/min — ABNORMAL LOW (ref >60)
GLUCOSE: 119 mg/dL — AB (ref 65–99)
Phosphorus: 2.8 mg/dL (ref 2.5–4.6)
Potassium: 5 mmol/L (ref 3.5–5.1)
SODIUM: 138 mmol/L (ref 135–145)

## 2017-11-22 MED ORDER — OXYCODONE HCL 5 MG PO TABS
5.0000 mg | ORAL_TABLET | ORAL | Status: DC | PRN
  Administered 2017-11-22 (×2): 5 mg via ORAL
  Filled 2017-11-22 (×2): qty 1

## 2017-11-22 MED ORDER — AMLODIPINE BESYLATE 2.5 MG PO TABS: 2.5000 mg | ORAL_TABLET | Freq: Every day | ORAL | 0 refills | Status: DC

## 2017-11-22 MED ORDER — OXYCODONE HCL 5 MG PO TABS: 5.0000 mg | ORAL_TABLET | Freq: Four times a day (QID) | ORAL | 0 refills | Status: AC | PRN

## 2017-11-22 NOTE — Discharge Summary (Signed)
Physician Discharge Summary  [name]  MWU:[id number]  DOB: 18-Oct-1956  DOA: 11/17/2017 PCP: Patient, No Pcp Per  Admit date: 11/17/2017 Discharge date: 11/22/2017  Admitted From: Home  Disposition: Home   Recommendations for Outpatient Follow-up:  1. Follow up with Oncology in 1-2 weeks  2. Establish care with PCP in 1-2 weeks  3. Follow up with Nephrology in 1 month 4. Follow up with ortho in 2 weeks for suture removal and wound check  5. Please obtain CMP/CBC in one week to monitor WBC, Ca+ and Cr  6. Please follow up on the following pending results: Bone marrow biopsy results, SPEP   Home Health: Aide, PT Equipment/Devices: Rolling walker   Discharge Condition: Good  CODE STATUS: Full code  Diet recommendation: Heart Healthy   Brief/Interim Summary: For full details see H&P/Progress notes, but in brief, Andres Fernandez is a 61 y/o M with PMH of BPH; admitted on12/07/2017, presented with complaint of generalized weakness, was found to have acute kidney injury, hypercalcemia, skeletal lytic lesions concerning for multiple myeloma. Bone marrow biopsy was performed and awaiting results. AKI improving and hypocalcemia has resolved.   Subjective: Patient seen and examined, he is working with PT, whom feels that he is safe with stairs. No acute events overnight. Pain well controlled. Tolerating diet well. Denies chest pain, SOB, palpitations, dizziness and weakness.   Discharge Diagnoses/Hospital Course:  Acute kidney injury Baseline Cr unknown, on admission Cr of 6.31, suspect is related to underlying malignancy.  Case discussed with Dr Marval Regal - recommending to follow up as outpatient in 1 month, Cr improving and expect to continue with this trend  US unremarkable Good urine output Check Cr in 1-2 weeks   ?? Multiple myeloma vs other bone marrow malignancy/Hypercalcemia/Lytic bone lesions on xray Patient presents with mild neurological symptoms without definitive  evidence of focal neurological deficit CT head reveals lytic lesions concerning for malignancy/multiple myeloma Case discussed with Dr. Jana Hakim - recommended to follow up as outpatient and await for biopsy results. He will schedule an appointment for patient to be seen in the next 2 weeks. Leukocytosis suspected from malignancy and steroids. Follow up labs in 1-2 weeks  Patient was treated Decadron 10 mg IV every 6 hours x 5 days  Pamidronate30 mg IV x1 dose  HyperCa+ resolved  SPEP/UPEP pending  ElevatedBeta-2 microglobulin serum, kappa/lambda light chains CT-guided bone marrow biopsy and aspiration done 12/11, pending results  Will discharge patient on OxyIR  PRN - since patient does not have PCP will give 2 weeks supply until seen by Onc     New diagnosis of HTN  Denies prior history of hypertension EKG and ECHO show LVH, concern for long standing hypertension, BP has been stable w/o antihypertensive.  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  the patient was felt safe to be discharge to home   Discharge Instructions  You were cared for by a hospitalist during your hospital stay. If you have any questions about your discharge medications or the care you received while you were in the hospital after you are discharged, you can call the unit and  asked to speak with the hospitalist on call if the hospitalist that took care of you is not available. Once you are discharged, your primary care physician will handle any further medical issues. Please note that NO REFILLS for any discharge medications will be authorized once you are discharged, as it is imperative that you return to your primary care physician (or establish a relationship with a primary care physician if you do not have one) for your aftercare needs so that they can reassess your need for medications and monitor your lab values.  Discharge Instructions    Call MD for:  difficulty breathing, headache or visual disturbances   Complete by:  As directed    Call MD for:  extreme fatigue   Complete by:  As directed    Call MD for:  hives   Complete by:  As directed    Call MD for:  persistant dizziness or light-headedness   Complete by:  As directed    Call MD for:  persistant nausea and vomiting   Complete by:  As directed    Call MD for:  redness, tenderness, or signs of infection (pain, swelling, redness, odor or green/yellow discharge around incision site)   Complete by:  As directed    Call MD for:  severe uncontrolled pain   Complete by:  As directed    Call MD for:  temperature >100.4   Complete by:  As directed    Diet - low sodium heart healthy   Complete by:  As directed    Increase activity slowly   Complete by:  As directed      Allergies as of 11/22/2017   No Known Allergies     Medication List    STOP taking these medications   ibuprofen 200 MG tablet Commonly known as:  ADVIL,MOTRIN     TAKE these medications   amLODipine 2.5 MG tablet Commonly known as:  NORVASC Take 1 tablet (2.5 mg total) by mouth daily.   multivitamin with minerals Tabs tablet Take 1 tablet by mouth daily.   oxyCODONE 5 MG immediate release tablet Commonly known as:  Oxy IR/ROXICODONE Take 1 tablet (5 mg total) by mouth every 6 (six) hours as needed for up to 14 days for  moderate pain.      Follow-up Information    Nicholes Stairs, MD Follow up in 2 week(s).   Specialty:  Orthopedic Surgery Why:  For wound re-check, For suture removal Contact information: [address] [place] [address] [phone]        Magrinat, Virgie Dad, MD. Call in 2 day(s).   Specialty:  Oncology Why:  Call to confirm appointment if not called from office in 2 days  Contact information: [place] [address] [phone]        Donato Heinz, MD. Schedule an appointment as soon as possible for a visit in 1 month(s).   Specialty:  Nephrology Why:  Hospital follow up  Contact information: [place] [address] [phone]          No Known Allergies  Consultations:  Renal   IR  Orthopedic surgery   Oncology    Procedures/Studies: Dg Chest 2 View  Result Date: 11/17/2017 CLINICAL DATA:  61 year old male with chest pain. EXAM: CHEST  2 VIEW COMPARISON:  Chest radiograph dated 05/10/2015 FINDINGS: Mild right lung base atelectatic changes/ scarring. There is no focal consolidation, pleural effusion, or pneumothorax. The cardiac silhouette is within normal limits. There is mild degenerative changes of the spine. No acute osseous pathology. IMPRESSION: No active cardiopulmonary disease. Electronically Signed   By: Anner Crete M.D.   On: 11/17/2017 07:08   Ct Head Wo Contrast  Result Date: 11/17/2017 CLINICAL DATA:  61 year old male with numbness and tingling. Paraplegia. EXAM: CT HEAD WITHOUT CONTRAST TECHNIQUE: Contiguous axial images were obtained from the base of the skull through the vertex without intravenous contrast. COMPARISON:  Facial bone CT dated 03/27/2010 FINDINGS: Brain: The ventricles and sulci appropriate size for patient's age. Minimal periventricular and deep white matter chronic microvascular ischemic changes noted. There is no acute intracranial hemorrhage. No mass  effect or midline shift noted. No extra-axial fluid collection. Vascular: There is slight high attenuation of the right MCA in the sylvian fissure (series 7 image 12). This may be chronic. If there is clinical concern for right MCA territory ischemia further evaluation with CT angiography of the head or MRI is recommended. Skull: There are innumerable lucent calvarial lesions concerning for multiple myeloma or metastatic disease. Correlation with history of known malignancy recommended. No acute fracture. Sinuses/Orbits: There is complete opacification of the visualized right maxillary sinus. Mild mucoperiosteal thickening of paranasal sinuses. There is a lytic lesion involving the posterior right mastoid versus effusion. Other: None IMPRESSION: 1. No acute intracranial hemorrhage. 2. Slightly high attenuation of the right MCA in the right sylvian fissure which may be chronic. Further evaluation with CTA or MRI is recommended if there is high clinical concern for acute right MCA territory ischemia. 3. 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 Correlation with history of malignancy recommended. Electronically Signed   By: Anner Crete M.D.   On: 11/17/2017 06:05   US Renal  Result Date: 11/17/2017 CLINICAL DATA:  Acute renal injury EXAM: RENAL / URINARY TRACT ULTRASOUND COMPLETE COMPARISON:  None. FINDINGS: Right Kidney: Length: 10.3 cm. Increased cortical echogenicity. No hydronephrosis. Than renal cortex. Left Kidney: Length: 10.2 cm. Increased cortical echogenicity. No hydronephrosis. Thinned renal cortex. Bladder: Appears normal for degree of bladder distention. IMPRESSION: 1. No hydronephrosis. 2. Increased cortical echogenicity and thinned renal cortex suggesting medical renal disease . Electronically Signed   By: Suzy Bouchard M.D.   On: 11/17/2017 08:32   Ct Femur Left Wo Contrast  Result Date: 11/18/2017 CLINICAL DATA:  Bilateral upper leg pain, worse on the right for 2 weeks.  Diffuse weakness. Lytic lesions in the femurs on osseous survey. EXAM: CT OF THE LOWER LEFT EXTREMITY WITHOUT CONTRAST TECHNIQUE: Multidetector CT imaging of the lower left extremity was performed according to the standard protocol. COMPARISON:  Osseous survey 11/17/2017. FINDINGS: Bones/Joint/Cartilage No fracture is identified on the right or left. Multiple foci of endosteal scalloping are seen in the femurs bilaterally consistent with lytic lesions on plain films. Tumor burden appears fairly symmetric on the right and left. Lytic lesions are also identified in the right and left acetabuli. On the left, cortical disruption is seen adjacent to the femoral head in the anterior wall measuring 1.4 cm AP, extending 0.9 cm into bone medially and measuring 2.2 cm craniocaudal. The inner cortical wall is not disrupted. On the right, lysis extends  across the medial wall of the acetabulum where there is cortical destruction adjacent to the femoral head measuring 3.5 cm AP AP by 2.0 cm craniocaudal. The inner cortical wall is not disrupted. Multiple foci of fatty marrow replacement are identified in both femurs consistent with tumor deposits. Ligaments Suboptimally assessed by CT. Muscles and Tendons Intact and normal appearance. Soft tissues No acute abnormality. IMPRESSION: Negative for fracture. Multiple lytic lesions are present throughout both femurs with an appearance most consistent with multiple myeloma. Lytic lesions are also seen in the walls of the acetabuli bilaterally most consistent with multiple myeloma. As described above, the inner cortical wall of both the right and left acetabulum is preserved with destruction of the outer cortical wall identified. Electronically Signed   By: Inge Rise M.D.   On: 11/18/2017 10:24   Ct Femur Right Wo Contrast  Result Date: 11/18/2017 CLINICAL DATA:  Bilateral upper leg pain, worse on the right for 2 weeks. Diffuse weakness. Lytic lesions in the femurs on osseous  survey. EXAM: CT OF THE LOWER LEFT EXTREMITY WITHOUT CONTRAST TECHNIQUE: Multidetector CT imaging of the lower left extremity was performed according to the standard protocol. COMPARISON:  Osseous survey 11/17/2017. FINDINGS: Bones/Joint/Cartilage No fracture is identified on the right or left. Multiple foci of endosteal scalloping are seen in the femurs bilaterally consistent with lytic lesions on plain films. Tumor burden appears fairly symmetric on the right and left. Lytic lesions are also identified in the right and left acetabuli. On the left, cortical disruption is seen adjacent to the femoral head in the anterior wall measuring 1.4 cm AP, extending 0.9 cm into bone medially and measuring 2.2 cm craniocaudal. The inner cortical wall is not disrupted. On the right, lysis extends across the medial wall of the acetabulum where there is cortical destruction adjacent to the femoral head measuring 3.5 cm AP AP by 2.0 cm craniocaudal. The inner cortical wall is not disrupted. Multiple foci of fatty marrow replacement are identified in both femurs consistent with tumor deposits. Ligaments Suboptimally assessed by CT. Muscles and Tendons Intact and normal appearance. Soft tissues No acute abnormality. IMPRESSION: Negative for fracture. Multiple lytic lesions are present throughout both femurs with an appearance most consistent with multiple myeloma. Lytic lesions are also seen in the walls of the acetabuli bilaterally most consistent with multiple myeloma. As described above, the inner cortical wall of both the right and left acetabulum is preserved with destruction of the outer cortical wall identified. Electronically Signed   By: Inge Rise M.D.   On: 11/18/2017 10:24   Dg Bone Survey Met  Result Date: 11/17/2017 CLINICAL DATA:  He is complaining of numbness in his distal arms and legs which has been present for the last 2 weeks. It is been generally getting worse. He also has noted a painful knot in the  right rib cage. He denies fever, chills, sweats.*comment was truncated* in a have a EXAM: METASTATIC BONE SURVEY COMPARISON:  Head -CT 11/17/2017 FINDINGS: Skull: Multiple lytic lesions throughout the calvarium. Lytic lesions scalloped the cortex of the LEFT and RIGHT humeri and clavicles. Lytic lesions in the radius and ulna of the forearms Lytic lesions scalloping enter cortex of the LEFT RIGHT femur. Lytic lesions within the fibulae. IMPRESSION: 1. Lytic lesions scalloping the inner cortex of the axillary and appendicular skeleton including the skull, clavicles, humeri, femurs and distal long bones. 2. Findings most suggestive multiple myeloma. Electronically Signed   By: Suzy Bouchard M.D.   On: 11/17/2017  09:50   Ct Bone Marrow Biopsy & Aspiration  Result Date: 11/20/2017 INDICATION: 61 year old male with a history of multiple myeloma EXAM: CT BONE MARROW BIOPSY AND ASPIRATION MEDICATIONS: None. ANESTHESIA/SEDATION: Moderate (conscious) sedation was employed during this procedure. A total of Versed 2.0 mg and Fentanyl 2 Hunter mcg was administered intravenously. Moderate Sedation Time: 11 minutes. The patient's level of consciousness and vital signs were monitored continuously by radiology nursing throughout the procedure under my direct supervision. FLUOROSCOPY TIME:  CT COMPLICATIONS: None PROCEDURE: The procedure risks, benefits, and alternatives were explained to the patient. Questions regarding the procedure were encouraged and answered. The patient understands and consents to the procedure. Scout CT of the pelvis was performed for surgical planning purposes. The posterior pelvis was prepped with chlorhexidinein a sterile fashion, and a sterile drape was applied covering the operative field. A sterile gown and sterile gloves were used for the procedure. Local anesthesia was provided with 1% Lidocaine. We targeted the left posterior iliac bone for biopsy. The skin and subcutaneous tissues were  infiltrated with 1% lidocaine without epinephrine. A small stab incision was made with an 11 blade scalpel, and an 11 gauge Murphy needle was advanced with CT guidance to the posterior cortex. Manual forced was used to advance the needle through the posterior cortex and the stylet was removed. A bone marrow aspirate was retrieved and passed to a cytotechnologist in the room. The Murphy needle was then advanced without the stylet for a core biopsy. The core biopsy was retrieved and also passed to a cytotechnologist. Manual pressure was used for hemostasis and a sterile dressing was placed. No complications were encountered no significant blood loss was encountered. Patient tolerated the procedure well and remained hemodynamically stable throughout. IMPRESSION: Status post CT-guided bone marrow biopsy, with tissue specimen sent to pathology for complete histopathologic analysis Signed, Dulcy Fanny. Earleen Newport, DO [facility] Electronically Signed   By: Corrie Mckusick D.O.   On: 11/20/2017 10:06   Dg C-arm 1-60 Min  Result Date: 11/20/2017 CLINICAL DATA:  Status post inter medullary nail and screw placement for impending right femur fracture due to metastatic disease. Multiple myeloma. EXAM: RIGHT FEMUR PORTABLE 2 VIEW; DG C-ARM 61-120 MIN COMPARISON:  CT scan of the femurs of November 17, 2017 FINDINGS: Fluoro time reported is 1 minutes, 24 seconds. Two fluoro spot images are observed. An intramedullary rod within the femoral shaft present. There are 2 telescoping screws in the right femoral neck and head and securing screw in the lower right femoral shaft. There is no immediate postprocedure complication. IMPRESSION: No immediate postprocedure complication following intramedullary nail placement of the right femur. Electronically Signed   By: David  Martinique M.D.   On: 11/20/2017 13:41   Dg Femur Port, Min 2 Views Right  Result Date: 11/20/2017 CLINICAL DATA:   Status post inter medullary nail and screw placement for impending right femur fracture due to metastatic disease. Multiple myeloma. EXAM: RIGHT FEMUR PORTABLE 2 VIEW; DG C-ARM 61-120 MIN COMPARISON:  CT scan of the femurs of November 17, 2017 FINDINGS: Fluoro time reported is 1 minutes, 24 seconds. Two fluoro spot images are observed. An intramedullary rod within the femoral shaft present. There are 2 telescoping screws in the right femoral neck and head and securing screw in the lower right femoral shaft. There is no immediate postprocedure complication. IMPRESSION: No immediate postprocedure complication following intramedullary nail placement of the right femur. Electronically Signed   By: David  Martinique M.D.  On: 11/20/2017 13:41    Discharge Exam: Vitals:   11/22/17 0600 11/22/17 0800  BP:  125/88  Pulse:    Resp: (!) 26 20  Temp:    SpO2:     Vitals:   11/22/17 0420 11/22/17 0513 11/22/17 0600 11/22/17 0800  BP: (!) 130/93   125/88  Pulse:      Resp: 16 16 (!) 26 20  Temp: 98.1 F (36.7 C)     TempSrc: Oral     SpO2: 96%     Weight:  65.7 kg (144 lb 14.4 oz)    Height:        General: Pt is alert, awake, not in acute distress Cardiovascular: RRR, S1/S2 +, no rubs, no gallops Respiratory: CTA bilaterally, no wheezing, no rhonchi Abdominal: Soft, NT, ND, bowel sounds + Extremities: no edema, dressing dry clean and intact    The results of significant diagnostics from this hospitalization (including imaging, microbiology, ancillary and laboratory) are listed below for reference.     Microbiology: Recent Results (from the past 240 hour(s))  Surgical pcr screen     Status: None   Collection Time: 11/20/17  6:15 AM  Result Value Ref Range Status   MRSA, PCR NEGATIVE NEGATIVE Final   Staphylococcus aureus NEGATIVE NEGATIVE Final    Comment: (NOTE) The Xpert SA Assay (FDA approved for NASAL specimens in patients 29 years of age and older), is one component of a  comprehensive surveillance program. It is not intended to diagnose infection nor to guide or monitor treatment.      Labs: BNP (last 3 results) No results for input(s): BNP in the last 8760 hours. Basic Metabolic Panel: Recent Labs  Lab 11/17/17 1047 11/18/17 0318 11/19/17 0227 11/20/17 0150 11/21/17 0340 11/22/17 0405  NA  --  137 135 133* 134* 138  K  --  4.4 4.5 4.7 5.0 5.0  CL  --  103 105 107 109 113*  CO2  --  22 20* 18* 16* 16*  GLUCOSE  --  152* 159* 137* 131* 119*  BUN  --  50* 47* 51* 54* 54*  CREATININE  --  5.96* 5.28* 4.31* 3.89* 3.65*  CALCIUM  --  11.5* 9.8 8.9 8.7* 8.5*  MG 2.7*  --  2.3  --   --   --   PHOS 4.8*  --   --  3.5 3.7 2.8   Liver Function Tests: Recent Labs  Lab 11/17/17 0415 11/18/17 0318 11/19/17 0227 11/20/17 0150 11/21/17 0340 11/22/17 0405  AST 41 26 21  --   --   --   ALT 31 26 23   --   --   --   ALKPHOS 78 63 61  --   --   --   BILITOT 0.5 0.5 0.4  --   --   --   PROT 7.3 6.4* 6.4*  --   --   --   ALBUMIN 3.9 3.5 3.4* 3.1* 3.1* 3.0*   No results for input(s): LIPASE, AMYLASE in the last 168 hours. No results for input(s): AMMONIA in the last 168 hours. CBC: Recent Labs  Lab 11/17/17 0415 11/17/17 0429 11/18/17 0318 11/19/17 0227 11/20/17 0150 11/22/17 0405  WBC 36.4*  --  36.4* 38.6* 31.7* 43.2*  NEUTROABS 6.6  --   --  5.8  --  16.8*  HGB 10.9* 10.2* 9.7* 9.4* 9.5* 8.3*  HCT 31.1* 30.0* 27.6* 26.8* 27.0* 23.1*  MCV 89.4  --  87.9 89.9 89.4  87.8  PLT 210  --  193 200 185 179   Cardiac Enzymes: No results for input(s): CKTOTAL, CKMB, CKMBINDEX, TROPONINI in the last 168 hours. BNP: Invalid input(s): POCBNP CBG: Recent Labs  Lab 11/17/17 0933  GLUCAP 103*   D-Dimer No results for input(s): DDIMER in the last 72 hours. Hgb A1c No results for input(s): HGBA1C in the last 72 hours. Lipid Profile No results for input(s): CHOL, HDL, LDLCALC, TRIG, CHOLHDL, LDLDIRECT in the last 72 hours. Thyroid function  studies No results for input(s): TSH, T4TOTAL, T3FREE, THYROIDAB in the last 72 hours.  Invalid input(s): FREET3 Anemia work up No results for input(s): VITAMINB12, FOLATE, FERRITIN, TIBC, IRON, RETICCTPCT in the last 72 hours. Urinalysis    Component Value Date/Time   COLORURINE COLORLESS (A) 11/17/2017 0931   APPEARANCEUR CLEAR 11/17/2017 0931   LABSPEC 1.005 11/17/2017 0931   PHURINE 7.0 11/17/2017 0931   GLUCOSEU NEGATIVE 11/17/2017 0931   HGBUR MODERATE (A) 11/17/2017 0931   BILIRUBINUR NEGATIVE 11/17/2017 0931   KETONESUR NEGATIVE 11/17/2017 0931   PROTEINUR NEGATIVE 11/17/2017 0931   UROBILINOGEN 0.2 04/13/2015 1410   NITRITE NEGATIVE 11/17/2017 0931   LEUKOCYTESUR NEGATIVE 11/17/2017 0931   Sepsis Labs Invalid input(s): PROCALCITONIN,  WBC,  LACTICIDVEN Microbiology Recent Results (from the past 240 hour(s))  Surgical pcr screen     Status: None   Collection Time: 11/20/17  6:15 AM  Result Value Ref Range Status   MRSA, PCR NEGATIVE NEGATIVE Final   Staphylococcus aureus NEGATIVE NEGATIVE Final    Comment: (NOTE) The Xpert SA Assay (FDA approved for NASAL specimens in patients 39 years of age and older), is one component of a comprehensive surveillance program. It is not intended to diagnose infection nor to guide or monitor treatment.     Time coordinating discharge: 35 minutes  SIGNED:  Chipper Oman, MD  [facility] 11/22/2017, 11:09 AM  Pager please text page via  [email or web address]

## 2017-11-22 NOTE — Progress Notes (Addendum)
Pt address for discharge- [place], [place] [address] Phone: [phone]

## 2017-11-22 NOTE — Care Management Note (Addendum)
Case Management Note Marvetta Gibbons RN, BSN Unit 4E-Case Manager [phone]  Patient Details  Name: Andres Fernandez MRN: [id number] Date of Birth: 1956-03-02  Subjective/Objective:    Pt admitted with ARF,  ??multiple myeloma vs bone marrow- s/p Intramedullary nailing 12/11              Action/Plan: PTA pt lived at home with fiance- plan to return home- per PT eval- recommendation for [facility] - pt will need orders for [facility] prior to discharge- CM will continue to follow for transition to home needs.   Expected Discharge Date:    11/22/17              Expected Discharge Plan:  [place]  In-House Referral:     Discharge planning Services  CM Consult  Post Acute Care Choice:   [facility], DME Choice offered to:   patient  DME Arranged:   RW DME Agency:   [facility]  HH Arranged:   PT, aide [place] Agency:   [place]  Status of Service:  Completed  If discussed at [place] of Stay Meetings, dates discussed:    Discharge Disposition: home/home health   Additional Comments:  1213/18- 1200- per MD- pt stable for d/c home today- spoke with pt at bedside for transition needs-per pt he plans to stay here in [place] for further tx and f/u with doctors- he has a PCP in [place] but has not seen them in several years prefers to find a new one here in [place] - will provide pt [facility] # to assist with finding PCP- pt has orders for HH/DME- choice offered pt does not have a preference and is fine using [facility]- referral called to Estelle with [facility] for RW need and HHPT/aide- RW to be delivered to room prior to discharge- [place] address confirmed with pt-   Dahlia Client, Romeo Rabon, RN 11/22/2017, 10:21 AM

## 2017-11-22 NOTE — Telephone Encounter (Signed)
Scheduled appt per 12/13 sch message - patient is aware of appt date and time.

## 2017-11-22 NOTE — Progress Notes (Signed)
Patient ID: RAIFE LIZER, male   DOB: Jun 22, 1956, 61 y.o.   MRN: [id number] S: Feels good  O:BP 125/88   Pulse 96   Temp 98.1 F (36.7 C) (Oral)   Resp 20   Ht 5' 8"  (1.727 m)   Wt 65.7 kg (144 lb 14.4 oz)   SpO2 96%   BMI 22.03 kg/m   Intake/Output Summary (Last 24 hours) at 11/22/2017 1148 Last data filed at 11/22/2017 0500 Gross per 24 hour  Intake 810 ml  Output 1780 ml  Net -970 ml   Intake/Output: I/O last 3 completed shifts: In: 2745 [P.O.:2170; I.V.:575] Out: 2880 [Urine:2880]  Intake/Output this shift:  No intake/output data recorded. Weight change: 0.526 kg (1 lb 2.6 oz) Gen: nad CVS: no rub Resp:cta Abd: benign Ext: no edema  Recent Labs  Lab 11/17/17 0415 11/17/17 0429 11/17/17 1047 11/18/17 0318 11/19/17 0227 11/20/17 0150 11/21/17 0340 11/22/17 0405  NA 136 136  --  137 135 133* 134* 138  K 4.5 4.4  --  4.4 4.5 4.7 5.0 5.0  CL 100* 104  --  103 105 107 109 113*  CO2 23  --   --  22 20* 18* 16* 16*  GLUCOSE 107* 106*  --  152* 159* 137* 131* 119*  BUN 43* 40*  --  50* 47* 51* 54* 54*  CREATININE 6.31* 6.30*  --  5.96* 5.28* 4.31* 3.89* 3.65*  ALBUMIN 3.9  --   --  3.5 3.4* 3.1* 3.1* 3.0*  CALCIUM 14.0*  --   --  11.5* 9.8 8.9 8.7* 8.5*  PHOS  --   --  4.8*  --   --  3.5 3.7 2.8  AST 41  --   --  26 21  --   --   --   ALT 31  --   --  26 23  --   --   --    Liver Function Tests: Recent Labs  Lab 11/17/17 0415 11/18/17 0318 11/19/17 0227 11/20/17 0150 11/21/17 0340 11/22/17 0405  AST 41 26 21  --   --   --   ALT 31 26 23   --   --   --   ALKPHOS 78 63 61  --   --   --   BILITOT 0.5 0.5 0.4  --   --   --   PROT 7.3 6.4* 6.4*  --   --   --   ALBUMIN 3.9 3.5 3.4* 3.1* 3.1* 3.0*   No results for input(s): LIPASE, AMYLASE in the last 168 hours. No results for input(s): AMMONIA in the last 168 hours. CBC: Recent Labs  Lab 11/17/17 0415  11/18/17 0318 11/19/17 0227 11/20/17 0150 11/22/17 0405  WBC 36.4*  --  36.4* 38.6* 31.7*  43.2*  NEUTROABS 6.6  --   --  5.8  --  16.8*  HGB 10.9*   < > 9.7* 9.4* 9.5* 8.3*  HCT 31.1*   < > 27.6* 26.8* 27.0* 23.1*  MCV 89.4  --  87.9 89.9 89.4 87.8  PLT 210  --  193 200 185 179   < > = values in this interval not displayed.   Cardiac Enzymes: No results for input(s): CKTOTAL, CKMB, CKMBINDEX, TROPONINI in the last 168 hours. CBG: Recent Labs  Lab 11/17/17 0933  GLUCAP 103*    Iron Studies: No results for input(s): IRON, TIBC, TRANSFERRIN, FERRITIN in the last 72 hours. Studies/Results: Dg C-arm 1-60 Min  Result Date:  11/20/2017 CLINICAL DATA:  Status post inter medullary nail and screw placement for impending right femur fracture due to metastatic disease. Multiple myeloma. EXAM: RIGHT FEMUR PORTABLE 2 VIEW; DG C-ARM 61-120 MIN COMPARISON:  CT scan of the femurs of November 17, 2017 FINDINGS: Fluoro time reported is 1 minutes, 24 seconds. Two fluoro spot images are observed. An intramedullary rod within the femoral shaft present. There are 2 telescoping screws in the right femoral neck and head and securing screw in the lower right femoral shaft. There is no immediate postprocedure complication. IMPRESSION: No immediate postprocedure complication following intramedullary nail placement of the right femur. Electronically Signed   By: David  Martinique M.D.   On: 11/20/2017 13:41   Dg Femur Port, Min 2 Views Right  Result Date: 11/20/2017 CLINICAL DATA:  Status post inter medullary nail and screw placement for impending right femur fracture due to metastatic disease. Multiple myeloma. EXAM: RIGHT FEMUR PORTABLE 2 VIEW; DG C-ARM 61-120 MIN COMPARISON:  CT scan of the femurs of November 17, 2017 FINDINGS: Fluoro time reported is 1 minutes, 24 seconds. Two fluoro spot images are observed. An intramedullary rod within the femoral shaft present. There are 2 telescoping screws in the right femoral neck and head and securing screw in the lower right femoral shaft. There is no immediate  postprocedure complication. IMPRESSION: No immediate postprocedure complication following intramedullary nail placement of the right femur. Electronically Signed   By: David  Martinique M.D.   On: 11/20/2017 13:41   . heparin  5,000 Units Subcutaneous Q8H  . sodium chloride flush  3 mL Intravenous Q12H    BMET    Component Value Date/Time   NA 138 11/22/2017 0405   K 5.0 11/22/2017 0405   CL 113 (H) 11/22/2017 0405   CO2 16 (L) 11/22/2017 0405   GLUCOSE 119 (H) 11/22/2017 0405   BUN 54 (H) 11/22/2017 0405   CREATININE 3.65 (H) 11/22/2017 0405   CALCIUM 8.5 (L) 11/22/2017 0405   GFRNONAA 17 (L) 11/22/2017 0405   GFRAA 19 (L) 11/22/2017 0405   CBC    Component Value Date/Time   WBC 43.2 (H) 11/22/2017 0405   RBC 2.63 (L) 11/22/2017 0405   HGB 8.3 (L) 11/22/2017 0405   HCT 23.1 (L) 11/22/2017 0405   PLT 179 11/22/2017 0405   MCV 87.8 11/22/2017 0405   MCH 31.6 11/22/2017 0405   MCHC 35.9 11/22/2017 0405   RDW 13.9 11/22/2017 0405   LYMPHSABS 26.4 (H) 11/22/2017 0405   MONOABS 0.0 (L) 11/22/2017 0405   EOSABS 0.0 11/22/2017 0405   BASOSABS 0.0 11/22/2017 0405     Assessment/Plan:  1. AKI- unclear baseline Scr as there are no other labs to compare. Presented with weakness, anorexia, and leg pain found to have Ca of 14, Cr 6.3 and lytic lesions of bone survey concerning for multiple myeloma. He is pending bone marrow biopsy today. Scr has improved with treatment of hypercalcemia and IVF's.  1. Await BM biopsy results.  2. 24 hour UPEP with M spike of 6.8 grams and large kappa light chain 3. Elevated beta-2 microglobulinand large kappa light chains.  4. SPEP/UPEP pending.  5. No baselineScr so unclear of chronicity 6. Needs Hematology consult as this is most consistent with multiple myeloma 7. Instructed to stopNSAIDs. 8. Follow up with me in the office in 4 weeks 2. Hypercalcemia- as above, resolved. 3. Anemia- as above, await BM bx. 4. Hyponatremia- due to #1  and will stop 0.45% NS and change to  0.9 NS at 43m/hr and follow. 5. Metabolic acidosis- can start sodium bicarb 13013mbid 6. Pathologic fx- s/p intramedullary nailing of right femur. 7. Disposition- for discharge today and will f/u with Hematology to start therapy.  F/u with me in about 4 weeks    JoDonetta PottsMD [phone]

## 2017-11-22 NOTE — Progress Notes (Addendum)
Physical Therapy Treatment Patient Details Name: Andres Fernandez MRN: [id number] DOB: 19-Nov-1956 Today's Date: 11/22/2017    History of Present Illness Pt admitted 11/17/17 with acute renal failure and found to have multiple lytic lesions suggestive of multiple myeloma vs other myeloid cancer. Pt underwent prophylactic stabilization of the right femur on 12/11 with IM nail. PMH - substance abuse history but now clean x 3 years. Does not want to use narcotics.   PT Comments    Pt progressing well with mobility. Able to amb 250' with RW and supervision. Ascended/descended steps with BUE support on L-rail and supervision for safety; no physical assist required. From a mobility perspective, feel pt is safe to d/c home today with HHPT, RW, and intermittent supervision from fiance. Will continue to follow acutely to maximize functional mobility and independence.    Follow Up Recommendations  Home health PT;Supervision - Intermittent     Equipment Recommendations  Rolling walker with 5" wheels    Recommendations for Other Services       Precautions / Restrictions Precautions Precautions: Fall Restrictions Weight Bearing Restrictions: Yes RLE Weight Bearing: Weight bearing as tolerated    Mobility  Bed Mobility Overal bed mobility: Independent             General bed mobility comments: Received sitting EOB; reports indep with bed mob  Transfers Overall transfer level: Needs assistance Equipment used: Rolling walker (2 wheeled) Transfers: Sit to/from Stand Sit to Stand: Supervision         General transfer comment: Good technique with RW; supervision for safety and balance  Ambulation/Gait Ambulation/Gait assistance: Supervision Ambulation Distance (Feet): 250 Feet Assistive device: Rolling walker (2 wheeled) Gait Pattern/deviations: Step-to pattern;Decreased stance time - right;Antalgic Gait velocity: Decreased Gait velocity interpretation: <1.8 ft/sec, indicative of  risk for recurrent falls General Gait Details: Very slow, controlled amb with RW and supervision for safety. Decreased WB on RLE and heavy reliance on BUEs to offload RLE due to pain   Stairs Stairs: Yes   Stair Management: One rail Left;Sideways;Forwards Number of Stairs: 6 General stair comments: Ascend/descended 6 steps with BUE support on L-side rail (to simulate stairs at home). Pt with decreased confidence on stairs despite needing no physical assist. Educ on technique to have fiance bring RW up/down for him, or scoot up/down stairs on buttocks if needed  Wheelchair Mobility    Modified Rankin (Stroke Patients Only)       Balance Overall balance assessment: No apparent balance deficits (not formally assessed)                                          Cognition Arousal/Alertness: Awake/alert Behavior During Therapy: WFL for tasks assessed/performed Overall Cognitive Status: Within Functional Limits for tasks assessed                                        Exercises      General Comments        Pertinent Vitals/Pain Pain Assessment: Faces Faces Pain Scale: Hurts even more Pain Location: RLE and rt hand Pain Descriptors / Indicators: Grimacing;Guarding Pain Intervention(s): Monitored during session;Limited activity within patient's tolerance    Home Living  Prior Function            PT Goals (current goals can now be found in the care plan section) Acute Rehab PT Goals Patient Stated Goal: return home PT Goal Formulation: With patient Time For Goal Achievement: 11/28/17 Potential to Achieve Goals: Good Progress towards PT goals: Progressing toward goals    Frequency    Min 5X/week      PT Plan Current plan remains appropriate    Co-evaluation              AM-PAC PT "6 Clicks" Daily Activity  Outcome Measure  Difficulty turning over in bed (including adjusting bedclothes,  sheets and blankets)?: None Difficulty moving from lying on back to sitting on the side of the bed? : None Difficulty sitting down on and standing up from a chair with arms (e.g., wheelchair, bedside commode, etc,.)?: A Little Help needed moving to and from a bed to chair (including a wheelchair)?: A Little Help needed walking in hospital room?: A Little Help needed climbing 3-5 steps with a railing? : A Little 6 Click Score: 20    End of Session Equipment Utilized During Treatment: Gait belt Activity Tolerance: Patient tolerated treatment well Patient left: in bed;with call bell/phone within reach;Other (comment)(with MD present) Nurse Communication: Mobility status PT Visit Diagnosis: Other abnormalities of gait and mobility (R26.89);Pain Pain - Right/Left: Right Pain - part of body: Leg     Time: 8845-7334 PT Time Calculation (min) (ACUTE ONLY): 30 min  Charges:  $Gait Training: 8-22 mins $Therapeutic Activity: 8-22 mins                    G Codes:      Mabeline Caras, PT, DPT Acute Rehab Services  Pager: [place] 11/22/2017, 11:13 AM

## 2017-11-23 ENCOUNTER — Other Ambulatory Visit: Payer: Self-pay

## 2017-11-23 ENCOUNTER — Telehealth: Payer: Self-pay | Admitting: *Deleted

## 2017-11-23 ENCOUNTER — Inpatient Hospital Stay (HOSPITAL_COMMUNITY)
Admission: EM | Admit: 2017-11-23 | Discharge: 2017-12-06 | DRG: 846 | Disposition: A | Discharge status: Home / Self Care | Payer: BLUE CROSS/BLUE SHIELD | Attending: Internal Medicine | Admitting: Internal Medicine

## 2017-11-23 ENCOUNTER — Encounter (HOSPITAL_COMMUNITY): Payer: Self-pay

## 2017-11-23 ENCOUNTER — Other Ambulatory Visit: Payer: Self-pay | Admitting: *Deleted

## 2017-11-23 DIAGNOSIS — Z6821 Body mass index (BMI) 21.0-21.9, adult: Secondary | ICD-10-CM

## 2017-11-23 DIAGNOSIS — Z720 Tobacco use: Secondary | ICD-10-CM | POA: Diagnosis not present

## 2017-11-23 DIAGNOSIS — R579 Shock, unspecified: Secondary | ICD-10-CM | POA: Diagnosis present

## 2017-11-23 DIAGNOSIS — N19 Unspecified kidney failure: Secondary | ICD-10-CM | POA: Diagnosis not present

## 2017-11-23 DIAGNOSIS — D6481 Anemia due to antineoplastic chemotherapy: Secondary | ICD-10-CM | POA: Diagnosis not present

## 2017-11-23 DIAGNOSIS — R0989 Other specified symptoms and signs involving the circulatory and respiratory systems: Secondary | ICD-10-CM | POA: Diagnosis not present

## 2017-11-23 DIAGNOSIS — D630 Anemia in neoplastic disease: Secondary | ICD-10-CM | POA: Diagnosis present

## 2017-11-23 DIAGNOSIS — B182 Chronic viral hepatitis C: Secondary | ICD-10-CM

## 2017-11-23 DIAGNOSIS — Z9049 Acquired absence of other specified parts of digestive tract: Secondary | ICD-10-CM

## 2017-11-23 DIAGNOSIS — Z825 Family history of asthma and other chronic lower respiratory diseases: Secondary | ICD-10-CM

## 2017-11-23 DIAGNOSIS — K5903 Drug induced constipation: Secondary | ICD-10-CM | POA: Diagnosis present

## 2017-11-23 DIAGNOSIS — E883 Tumor lysis syndrome: Secondary | ICD-10-CM | POA: Diagnosis present

## 2017-11-23 DIAGNOSIS — I10 Essential (primary) hypertension: Secondary | ICD-10-CM | POA: Diagnosis present

## 2017-11-23 DIAGNOSIS — F1021 Alcohol dependence, in remission: Secondary | ICD-10-CM | POA: Diagnosis present

## 2017-11-23 DIAGNOSIS — D61818 Other pancytopenia: Secondary | ICD-10-CM | POA: Diagnosis not present

## 2017-11-23 DIAGNOSIS — G893 Neoplasm related pain (acute) (chronic): Secondary | ICD-10-CM | POA: Diagnosis present

## 2017-11-23 DIAGNOSIS — R040 Epistaxis: Secondary | ICD-10-CM | POA: Diagnosis present

## 2017-11-23 DIAGNOSIS — F17210 Nicotine dependence, cigarettes, uncomplicated: Secondary | ICD-10-CM | POA: Diagnosis present

## 2017-11-23 DIAGNOSIS — R269 Unspecified abnormalities of gait and mobility: Secondary | ICD-10-CM | POA: Diagnosis present

## 2017-11-23 DIAGNOSIS — E875 Hyperkalemia: Secondary | ICD-10-CM | POA: Diagnosis present

## 2017-11-23 DIAGNOSIS — Z800 Family history of malignant neoplasm of digestive organs: Secondary | ICD-10-CM

## 2017-11-23 DIAGNOSIS — D649 Anemia, unspecified: Secondary | ICD-10-CM | POA: Diagnosis not present

## 2017-11-23 DIAGNOSIS — N179 Acute kidney failure, unspecified: Secondary | ICD-10-CM | POA: Diagnosis present

## 2017-11-23 DIAGNOSIS — Z5111 Encounter for antineoplastic chemotherapy: Secondary | ICD-10-CM | POA: Diagnosis not present

## 2017-11-23 DIAGNOSIS — C9010 Plasma cell leukemia not having achieved remission: Secondary | ICD-10-CM

## 2017-11-23 DIAGNOSIS — M84551A Pathological fracture in neoplastic disease, right femur, initial encounter for fracture: Secondary | ICD-10-CM | POA: Diagnosis not present

## 2017-11-23 DIAGNOSIS — R945 Abnormal results of liver function studies: Secondary | ICD-10-CM | POA: Diagnosis not present

## 2017-11-23 DIAGNOSIS — R6511 Systemic inflammatory response syndrome (SIRS) of non-infectious origin with acute organ dysfunction: Secondary | ICD-10-CM | POA: Diagnosis not present

## 2017-11-23 DIAGNOSIS — D696 Thrombocytopenia, unspecified: Secondary | ICD-10-CM | POA: Diagnosis not present

## 2017-11-23 DIAGNOSIS — B1920 Unspecified viral hepatitis C without hepatic coma: Secondary | ICD-10-CM | POA: Diagnosis present

## 2017-11-23 DIAGNOSIS — D892 Hypergammaglobulinemia, unspecified: Secondary | ICD-10-CM | POA: Diagnosis present

## 2017-11-23 DIAGNOSIS — E872 Acidosis: Secondary | ICD-10-CM | POA: Diagnosis present

## 2017-11-23 DIAGNOSIS — J9601 Acute respiratory failure with hypoxia: Secondary | ICD-10-CM | POA: Diagnosis present

## 2017-11-23 DIAGNOSIS — R6510 Systemic inflammatory response syndrome (SIRS) of non-infectious origin without acute organ dysfunction: Secondary | ICD-10-CM | POA: Diagnosis not present

## 2017-11-23 DIAGNOSIS — R52 Pain, unspecified: Secondary | ICD-10-CM | POA: Diagnosis not present

## 2017-11-23 DIAGNOSIS — R0602 Shortness of breath: Secondary | ICD-10-CM | POA: Diagnosis not present

## 2017-11-23 DIAGNOSIS — R509 Fever, unspecified: Secondary | ICD-10-CM

## 2017-11-23 DIAGNOSIS — R0902 Hypoxemia: Secondary | ICD-10-CM

## 2017-11-23 DIAGNOSIS — D689 Coagulation defect, unspecified: Secondary | ICD-10-CM | POA: Diagnosis present

## 2017-11-23 DIAGNOSIS — K819 Cholecystitis, unspecified: Secondary | ICD-10-CM | POA: Diagnosis present

## 2017-11-23 DIAGNOSIS — C9000 Multiple myeloma not having achieved remission: Secondary | ICD-10-CM | POA: Diagnosis present

## 2017-11-23 DIAGNOSIS — T451X5A Adverse effect of antineoplastic and immunosuppressive drugs, initial encounter: Secondary | ICD-10-CM | POA: Diagnosis not present

## 2017-11-23 DIAGNOSIS — N400 Enlarged prostate without lower urinary tract symptoms: Secondary | ICD-10-CM | POA: Diagnosis present

## 2017-11-23 DIAGNOSIS — J432 Centrilobular emphysema: Secondary | ICD-10-CM | POA: Diagnosis present

## 2017-11-23 DIAGNOSIS — F17200 Nicotine dependence, unspecified, uncomplicated: Secondary | ICD-10-CM | POA: Diagnosis not present

## 2017-11-23 DIAGNOSIS — D6959 Other secondary thrombocytopenia: Secondary | ICD-10-CM | POA: Diagnosis present

## 2017-11-23 DIAGNOSIS — R64 Cachexia: Secondary | ICD-10-CM | POA: Diagnosis present

## 2017-11-23 DIAGNOSIS — M84551G Pathological fracture in neoplastic disease, right femur, subsequent encounter for fracture with delayed healing: Secondary | ICD-10-CM | POA: Diagnosis present

## 2017-11-23 DIAGNOSIS — E440 Moderate protein-calorie malnutrition: Secondary | ICD-10-CM | POA: Diagnosis present

## 2017-11-23 DIAGNOSIS — I959 Hypotension, unspecified: Secondary | ICD-10-CM | POA: Diagnosis not present

## 2017-11-23 DIAGNOSIS — A419 Sepsis, unspecified organism: Secondary | ICD-10-CM | POA: Diagnosis not present

## 2017-11-23 DIAGNOSIS — R200 Anesthesia of skin: Secondary | ICD-10-CM | POA: Diagnosis not present

## 2017-11-23 DIAGNOSIS — Z87898 Personal history of other specified conditions: Secondary | ICD-10-CM

## 2017-11-23 DIAGNOSIS — E790 Hyperuricemia without signs of inflammatory arthritis and tophaceous disease: Secondary | ICD-10-CM | POA: Diagnosis present

## 2017-11-23 DIAGNOSIS — R7989 Other specified abnormal findings of blood chemistry: Secondary | ICD-10-CM

## 2017-11-23 DIAGNOSIS — R531 Weakness: Secondary | ICD-10-CM | POA: Diagnosis not present

## 2017-11-23 HISTORY — DX: Plasma cell leukemia not having achieved remission: C90.10

## 2017-11-23 HISTORY — DX: Essential (primary) hypertension: I10

## 2017-11-23 HISTORY — DX: Unspecified viral hepatitis C without hepatic coma: B19.20

## 2017-11-23 LAB — CBC WITH DIFFERENTIAL/PLATELET
BLASTS: 0 %
Band Neutrophils: 0 %
Basophils Absolute: 0 10*3/uL (ref 0.0–0.1)
Basophils Relative: 0 %
EOS ABS: 0 10*3/uL (ref 0.0–0.7)
Eosinophils Relative: 0 %
HEMATOCRIT: 23 % — AB (ref 39.0–52.0)
Hemoglobin: 8.1 g/dL — ABNORMAL LOW (ref 13.0–17.0)
LYMPHS PCT: 73 %
Lymphs Abs: 40.3 10*3/uL — ABNORMAL HIGH (ref 0.7–4.0)
MCH: 31.3 pg (ref 26.0–34.0)
MCHC: 35.2 g/dL (ref 30.0–36.0)
MCV: 88.8 fL (ref 78.0–100.0)
Metamyelocytes Relative: 0 %
Monocytes Absolute: 1.7 10*3/uL — ABNORMAL HIGH (ref 0.1–1.0)
Monocytes Relative: 3 %
Myelocytes: 2 %
NEUTROS PCT: 22 %
NRBC: 1 /100{WBCs} — AB
Neutro Abs: 13.3 10*3/uL — ABNORMAL HIGH (ref 1.7–7.7)
OTHER: 0 %
PLATELETS: 213 10*3/uL (ref 150–400)
PROMYELOCYTES ABS: 0 %
RBC: 2.59 MIL/uL — AB (ref 4.22–5.81)
RDW: 14.6 % (ref 11.5–15.5)
WBC: 55.3 10*3/uL — AB (ref 4.0–10.5)

## 2017-11-23 LAB — COMPREHENSIVE METABOLIC PANEL
ALT: 27 U/L (ref 17–63)
AST: 35 U/L (ref 15–41)
Albumin: 3.2 g/dL — ABNORMAL LOW (ref 3.5–5.0)
Alkaline Phosphatase: 58 U/L (ref 38–126)
Anion gap: 7 (ref 5–15)
BUN: 52 mg/dL — AB (ref 6–20)
CHLORIDE: 113 mmol/L — AB (ref 101–111)
CO2: 19 mmol/L — ABNORMAL LOW (ref 22–32)
CREATININE: 3.8 mg/dL — AB (ref 0.61–1.24)
Calcium: 8.3 mg/dL — ABNORMAL LOW (ref 8.9–10.3)
GFR calc Af Amer: 18 mL/min — ABNORMAL LOW (ref >60)
GFR, EST NON AFRICAN AMERICAN: 16 mL/min — AB (ref >60)
Glucose, Bld: 110 mg/dL — ABNORMAL HIGH (ref 65–99)
Potassium: 4.8 mmol/L (ref 3.5–5.1)
Sodium: 139 mmol/L (ref 135–145)
Total Bilirubin: 0.5 mg/dL (ref 0.3–1.2)
Total Protein: 6.3 g/dL — ABNORMAL LOW (ref 6.5–8.1)

## 2017-11-23 LAB — URIC ACID: Uric Acid, Serum: 6.7 mg/dL (ref 4.4–7.6)

## 2017-11-23 LAB — RETICULOCYTES
RBC.: 2.59 MIL/uL — ABNORMAL LOW (ref 4.22–5.81)
RETIC CT PCT: 1.3 % (ref 0.4–3.1)
Retic Count, Absolute: 33.7 10*3/uL (ref 19.0–186.0)

## 2017-11-23 LAB — SAVE SMEAR

## 2017-11-23 LAB — LACTATE DEHYDROGENASE: LDH: 240 U/L — AB (ref 98–192)

## 2017-11-23 LAB — SEDIMENTATION RATE: SED RATE: 45 mm/h — AB (ref 0–16)

## 2017-11-23 MED ORDER — SODIUM CHLORIDE 0.9 % IV SOLN
INTRAVENOUS | Status: DC
  Administered 2017-11-23 – 2017-11-25 (×2): via INTRAVENOUS

## 2017-11-23 MED ORDER — BORTEZOMIB CHEMO SQ INJECTION 3.5 MG (2.5MG/ML): 1.3000 mg/m2 | Freq: Once | INTRAMUSCULAR | Status: DC

## 2017-11-23 MED ORDER — BORTEZOMIB CHEMO SQ INJECTION 3.5 MG (2.5MG/ML)
1.3000 mg/m2 | Freq: Once | INTRAMUSCULAR | Status: DC
  Administered 2017-11-24: 2.25 mg via SUBCUTANEOUS
  Filled 2017-11-23: qty 2.25

## 2017-11-23 MED ORDER — OXYCODONE HCL 5 MG PO TABS
5.0000 mg | ORAL_TABLET | ORAL | Status: DC | PRN
  Administered 2017-11-23 – 2017-11-25 (×7): 10 mg via ORAL
  Filled 2017-11-23 (×7): qty 2

## 2017-11-23 MED ORDER — ACYCLOVIR 200 MG PO CAPS
200.0000 mg | ORAL_CAPSULE | Freq: Every day | ORAL | Status: DC
  Administered 2017-11-23 – 2017-12-06 (×14): 200 mg via ORAL
  Filled 2017-11-23 (×16): qty 1

## 2017-11-23 MED ORDER — ALLOPURINOL 100 MG PO TABS
100.0000 mg | ORAL_TABLET | Freq: Every day | ORAL | Status: DC
  Administered 2017-11-23 – 2017-12-06 (×13): 100 mg via ORAL
  Filled 2017-11-23 (×14): qty 1

## 2017-11-23 MED ORDER — DEXAMETHASONE 4 MG PO TABS
20.0000 mg | ORAL_TABLET | Freq: Once | ORAL | Status: AC
  Administered 2017-11-23: 20 mg via ORAL

## 2017-11-23 MED ORDER — HEPARIN SODIUM (PORCINE) 5000 UNIT/ML IJ SOLN
5000.0000 [IU] | Freq: Three times a day (TID) | INTRAMUSCULAR | Status: DC
  Administered 2017-11-23 – 2017-11-27 (×10): 5000 [IU] via SUBCUTANEOUS
  Filled 2017-11-23 (×9): qty 1

## 2017-11-23 MED ORDER — DEXAMETHASONE 4 MG PO TABS
20.0000 mg | ORAL_TABLET | Freq: Once | ORAL | Status: AC
  Administered 2017-11-23: 20 mg via ORAL
  Filled 2017-11-23 (×2): qty 5

## 2017-11-23 MED ORDER — POLYETHYLENE GLYCOL 3350 17 G PO PACK
17.0000 g | PACK | Freq: Every day | ORAL | Status: DC
  Administered 2017-11-23 – 2017-12-06 (×10): 17 g via ORAL
  Filled 2017-11-23 (×12): qty 1

## 2017-11-23 MED ORDER — ONDANSETRON HCL 4 MG/2ML IJ SOLN
4.0000 mg | Freq: Three times a day (TID) | INTRAMUSCULAR | Status: DC | PRN
  Administered 2017-11-24 – 2017-11-27 (×3): 4 mg via INTRAVENOUS
  Filled 2017-11-23 (×5): qty 2

## 2017-11-23 MED ORDER — ONDANSETRON HCL 4 MG/2ML IJ SOLN
4.0000 mg | Freq: Once | INTRAMUSCULAR | Status: AC
Start: 2017-11-24 — End: 2017-11-23
  Administered 2017-11-23: 4 mg via INTRAVENOUS

## 2017-11-23 MED ORDER — DEXAMETHASONE SODIUM PHOSPHATE 4 MG/ML IJ SOLN: 20.0000 mg | Freq: Once | INTRAMUSCULAR | Status: DC

## 2017-11-23 NOTE — ED Notes (Signed)
Bed: WLPT1 Expected date:  Expected time:  Means of arrival:  Comments: 

## 2017-11-23 NOTE — H&P (Signed)
[facility] History and Physical  Andres Fernandez TML:[id number] DOB: August 28, 1956 DOA: 11/23/2017   PCP: Patient, No Pcp Per  Specialists: Recently operated upon by Dr. Stann Mainland with orthopedics.   Chief Complaint: Asked to come into the hospital by oncology  HPI: Andres Fernandez is a 61 y.o. male who had a recent hospitalization where he was found to have lytic lesions especially in his right femur with evidence for potential pathological fracture.  Patient underwent intramedullary nail placement.  Patient underwent further workup for his blood dyscrasias including leukocytosis and anemia.  He was found to have acute renal failure and hypercalcemia.  Patient was treated with steroids.  He was given pamidronate.  Patient underwent CT-guided bone marrow biopsy.  He was subsequently discharged home.  The patient's bone marrow biopsy results became available today which apparently revealed plasma cell leukemia.  Needs aggressive treatment.  The patient was asked to come back into the hospital.  Patient continues to have pain in his right lower extremity from his recent surgery.  He denies any chest pain shortness of breath.  No nausea vomiting.  No fever or chills recently.  Has not had a bowel movement in many days.  Denies any difficulty urinating.  Denies any leg swelling.  Patient was seen by oncology in the emergency room orders were written to start the patient on chemotherapeutic agents.  Blood work is pending as of this note.  Home Medications: Prior to Admission medications   Medication Sig Start Date End Date Taking? Authorizing Provider  amLODipine (NORVASC) 2.5 MG tablet Take 1 tablet (2.5 mg total) by mouth daily. 11/22/17 12/22/17 Yes Doreatha Lew, MD  cholecalciferol (VITAMIN D) 400 units TABS tablet Take 400 Units by mouth daily.   Yes [provider]  oxyCODONE (OXY IR/ROXICODONE) 5 MG immediate release tablet Take 1 tablet (5 mg total) by mouth every 6 (six)  hours as needed for up to 14 days for moderate pain. 11/22/17 12/06/17 Yes Doreatha Lew, MD    Allergies: No Known Allergies  Past Medical History: Past Medical History:  Diagnosis Date  . BPH (benign prostatic hyperplasia)   . Hypertension   . Tobacco abuse     Past Surgical History:  Procedure Laterality Date  . APPENDECTOMY    . FEMUR IM NAIL Right 11/20/2017   Procedure: RIGHT FEMORAL INTRAMEDULLARY (IM) NAIL;  Surgeon: Nicholes Stairs, MD;  Location: [place];  Service: Orthopedics;  Laterality: Right;    Social History: Patient currently lives with a significant other.  He has been using a walker to ambulate.  Used to smoke half a pack of cigarettes and quit when he was hospitalized 2 weeks ago.  Quit drinking alcohol about 2 years ago.  Denies any recreational drug use currently.   Family History:  Family History  Problem Relation Age of Onset  . COPD Mother   . Liver cancer Mother      Review of Systems - History obtained from the patient General ROS: positive for  - fatigue Psychological ROS: negative Ophthalmic ROS: negative ENT ROS: negative Allergy and Immunology ROS: negative Hematological and Lymphatic ROS: negative Endocrine ROS: negative Respiratory ROS: no cough, shortness of breath, or wheezing Cardiovascular ROS: no chest pain or dyspnea on exertion Gastrointestinal ROS: as in hpi Genito-Urinary ROS: no dysuria, trouble voiding, or hematuria Musculoskeletal ROS: Positive for pain in the right lower extremity as well as over his rib cage Neurological ROS: no TIA or stroke symptoms Dermatological ROS:  negative  Physical Examination  Vitals:   11/23/17 1440 11/23/17 1443  BP: 128/68   Pulse: 95   Resp: 18   Temp: 98.5 F (36.9 C)   TempSrc: Oral   Weight:  65.3 kg (144 lb)  Height:  5' 8"  (1.727 m)    BP 128/68 (BP Location: Right Arm)   Pulse 95   Temp 98.5 F (36.9 C) (Oral)   Resp 18   Ht 5' 8"  (1.727 m)   Wt 65.3 kg (144  lb)   BMI 21.90 kg/m   General appearance: alert, cooperative, appears stated age and no distress Head: Normocephalic, without obvious abnormality, atraumatic Eyes: conjunctivae/corneas clear. PERRL, EOM's intact.  Throat: Dry mucous membranes Neck: no adenopathy, no carotid bruit, no JVD, supple, symmetrical, trachea midline and thyroid not enlarged, symmetric, no tenderness/mass/nodules Resp: clear to auscultation bilaterally Chest wall: Tender over the rib cage bilaterally Cardio: regular rate and rhythm, S1, S2 normal, no murmur, click, rub or gallop GI: soft, non-tender; bowel sounds normal; no masses,  no organomegaly Extremities: No erythema noted over the incision sites in the right lower extremity Pulses: 2+ and symmetric Skin: Skin color, texture, turgor normal. No rashes or lesions Lymph nodes: Cervical, supraclavicular, and axillary nodes normal. Neurologic: No focal neurological deficits noted.   Labs on Admission: I have personally reviewed following labs and imaging studies  CBC: Recent Labs  Lab 11/17/17 0415 11/17/17 0429 11/18/17 0318 11/19/17 0227 11/20/17 0150 11/22/17 0405  WBC 36.4*  --  36.4* 38.6* 31.7* 43.2*  NEUTROABS 6.6  --   --  5.8  --  16.8*  HGB 10.9* 10.2* 9.7* 9.4* 9.5* 8.3*  HCT 31.1* 30.0* 27.6* 26.8* 27.0* 23.1*  MCV 89.4  --  87.9 89.9 89.4 87.8  PLT 210  --  193 200 185 765   Basic Metabolic Panel: Recent Labs  Lab 11/17/17 1047 11/18/17 0318 11/19/17 0227 11/20/17 0150 11/21/17 0340 11/22/17 0405  NA  --  137 135 133* 134* 138  K  --  4.4 4.5 4.7 5.0 5.0  CL  --  103 105 107 109 113*  CO2  --  22 20* 18* 16* 16*  GLUCOSE  --  152* 159* 137* 131* 119*  BUN  --  50* 47* 51* 54* 54*  CREATININE  --  5.96* 5.28* 4.31* 3.89* 3.65*  CALCIUM  --  11.5* 9.8 8.9 8.7* 8.5*  MG 2.7*  --  2.3  --   --   --   PHOS 4.8*  --   --  3.5 3.7 2.8   GFR: Estimated Creatinine Clearance: 19.9 mL/min (A) (by C-G formula based on SCr of 3.65  mg/dL (H)). Liver Function Tests: Recent Labs  Lab 11/17/17 0415 11/18/17 0318 11/19/17 0227 11/20/17 0150 11/21/17 0340 11/22/17 0405  AST 41 26 21  --   --   --   ALT 31 26 23   --   --   --   ALKPHOS 78 63 61  --   --   --   BILITOT 0.5 0.5 0.4  --   --   --   PROT 7.3 6.4* 6.4*  --   --   --   ALBUMIN 3.9 3.5 3.4* 3.1* 3.1* 3.0*   Coagulation Profile: Recent Labs  Lab 11/17/17 0415 11/19/17 0227  INR 0.99 1.11   CBG: Recent Labs  Lab 11/17/17 0933  GLUCAP 103*    Radiological Exams on Admission: No results found.  Problem List  Principal Problem:   Plasma cell leukemia (HCC) Active Problems:   Acute renal failure (ARF) (HCC)   Tobacco abuse   Normocytic anemia   Pathological fracture in neoplastic disease, right femur, initial encounter for fracture [facility])   Assessment: This is an unfortunate 61 year old African-American male who was recently found to have lytic lesions and hypercalcemia.  Overall picture was concerning for multiple myeloma.  He had a bone marrow biopsy done which has revealed plasma cell leukemia.  Patient has been hospitalized based on recommendations of oncology for aggressive treatment.  Plan: #1 plasma cell leukemia with extensive bone lesions: Management per oncology.  Patient to be started on chemotherapeutic agents.  He will be admitted to the oncology floor.  He will be given IV fluids.  Labs will need to be monitored closely every day.  Labs from today are pending.  Discussed with Dr. Irene Limbo as well.  #2  Acute renal failure versus acute renal failure on chronic kidney disease: Most likely due to the above process.  Patient was seen by nephrology during his recent hospitalization.  His creatinine had been improving.  He has been making urine.  Continue to monitor labs on a daily basis.  #3 recent hypercalcemia: The patient was given bisphosphonate during recent hospitalization.  Calcium level has improved.  Continue to monitor  closely.  #4  Recent right femoral intramedullary nail placement: This was for a lytic lesion with impending pathological fracture.  Continue pain medications.  PT and OT evaluation.  #5 newly diagnosed essential hypertension: Patient was started on low-dose Norvasc.  We will continue to monitor blood pressure closely.  Hold his oral medication for now.  #6  Normocytic anemia: Most likely secondary to malignancy.  He also was noted to have significant leukocytosis which is also due to his cancer.  Platelet counts are normal as of now.  #7 History of tobacco abuse: Patient has not smoked since he was last hospitalized.  Continue to emphasize smoking cessation.  #8 Constipation: Most likely secondary to pain medications.  He will be started on MiraLAX.  DVT Prophylaxis: SQ heparin Code Status: Full code Family Communication: Discussed with patient and his significant other Consults called: Oncology  Severity of Illness: The appropriate patient status for this patient is INPATIENT. Inpatient status is judged to be reasonable and necessary in order to provide the required intensity of service to ensure the patient's safety. The patient's presenting symptoms, physical exam findings, and initial radiographic and laboratory data in the context of their chronic comorbidities is felt to place them at high risk for further clinical deterioration. Furthermore, it is not anticipated that the patient will be medically stable for discharge from the hospital within 2 midnights of admission. The following factors support the patient status of inpatient.   " The patient's presenting symptoms include generalized weakness. " The worrisome physical exam findings include tender over rib cage. " The initial radiographic and laboratory data are worrisome because of leukocytosis, renal failure. " The chronic co-morbidities include cancer.   * I certify that at the point of admission it is my clinical judgment that  the patient will require inpatient hospital care spanning beyond 2 midnights from the point of admission due to high intensity of service, high risk for further deterioration and high frequency of surveillance required.*  Further management decisions will depend on results of further testing and patient's response to treatment.   Andres Fernandez  [facility] Pager [phone]  If 7PM-7AM, please  contact night-coverage [email or web address] Password [facility]  11/23/2017, 6:09 PM

## 2017-11-23 NOTE — ED Notes (Signed)
pts daughter came through the triage doors stating that she needed a wheelchair I was currently assisting another pt and told her that they were some in the lobby the daughter then stated that oncology sent her dad for a direct admit where did she need to go, I then told the daughter where the registration window was and that they would help get her father where he needed to be.

## 2017-11-23 NOTE — Telephone Encounter (Signed)
Per Dr. Irene Limbo, [place] ED Charge RN, [facility] about patient coming to ED from home due to new cancer diagnosis.  Catalina Antigua, RN verbalized understanding.

## 2017-11-23 NOTE — ED Triage Notes (Signed)
Patient and family report that they got a phone call from the oncologist, but did not know his name that he needed to come to the ED immediately.

## 2017-11-23 NOTE — ED Notes (Signed)
Patient's daughter reported to [facility] EMT that they were sent by the oncologist to be admitted directly to the hospital. Writer went out to verify this since patient was in the ED Lobby, Daughter verified this Writer explained that they should go to Admitting and gave directions. A second daughter walked up and stated that they were told to come to the ED. Writer questioned both about what was said to them, but first daughter took the patient and headed to Admitting, mumbling under breath as if she was mad.

## 2017-11-23 NOTE — ED Provider Notes (Signed)
[facility] DEPT Provider Note  CSN: [id number] Arrival date & time: 11/23/17 1411  Chief Complaint(s) Abnormal Lab and sent by oncologist  HPI Andres Fernandez is a 61 y.o. 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  He was instructed to present for admission and immediate initiation of chemotherapy.  Patient has been complaining of numbness and pain in the right upper extremity and bilateral thighs with associated week.  Also been complaining of constipation and nausea with 2 nonbloody nonbilious emesis.  Also endorses subjective fevers, chills and night sweats.  He has had approximately 15 pound weight loss over the past several weeks.  Denies any chest pain, shortness of breath.  Denies any other physical complaints.   HPI  Past Medical History Past Medical History:  Diagnosis Date  . BPH (benign prostatic hyperplasia)   . Tobacco abuse    Patient Active Problem List   Diagnosis Date Noted  . Lytic bone lesion of right femur 11/20/2017  . Acute renal failure (ARF) ([place]) 11/17/2017  . ?? Multiple myeloma vs other bone marrow malignancy 11/17/2017  . Hypercalcemia 11/17/2017  . BPH (benign prostatic hyperplasia) 11/17/2017  . Tobacco abuse 11/17/2017  . Elevated blood-pressure reading without diagnosis of hypertension 11/17/2017  . Lytic bone lesions on xray 11/17/2017  . Anemia 11/17/2017   Home Medication(s) Prior to Admission medications   Medication Sig Start Date End Date Taking? Authorizing Provider  amLODipine (NORVASC) 2.5 MG tablet Take 1 tablet (2.5 mg total) by mouth daily. 11/22/17 12/22/17  Doreatha Lew, MD  Multiple Vitamin (MULTIVITAMIN WITH MINERALS) TABS tablet Take 1 tablet by mouth daily.    [provider]  oxyCODONE (OXY IR/ROXICODONE) 5 MG immediate release tablet Take 1 tablet (5 mg total) by mouth every 6 (six) hours as needed for up to 14 days for moderate pain. 11/22/17 12/06/17  Doreatha Lew, MD                                                                                                                                    Past Surgical History Past Surgical History:  Procedure Laterality Date  . APPENDECTOMY    . FEMUR IM NAIL Right 11/20/2017   Procedure: RIGHT FEMORAL INTRAMEDULLARY (IM) NAIL;  Surgeon: Nicholes Stairs, MD;  Location: [facility];  Service: Orthopedics;  Laterality: Right;   Family History Family History  Problem Relation Age of Onset  . COPD Mother   . Liver cancer Mother     Social History Social History   Tobacco Use  . Smoking status: Current Every Day Smoker    Packs/day: 0.50    Types: Cigarettes  . Smokeless tobacco: Never Used  . Tobacco comment: no cigarettes  x 1 week.  Substance Use Topics  . Alcohol use: Yes  . Drug use: No   Allergies Patient has no known allergies.  Review of Systems Review of Systems All other systems are reviewed and are negative for acute change except as noted in the HPI  Physical Exam Vital Signs  I have reviewed the triage vital signs BP 128/68 (BP Location: Right Arm)   Pulse 95   Temp 98.5 F (36.9 C) (Oral)   Resp 18   Ht 5' 8"  (1.727 m)   Wt 65.3 kg (144 lb)   BMI 21.90 kg/m   Physical Exam  Constitutional: He is oriented to person, place, and time. He appears well-developed. He appears cachectic. No distress.  HENT:  Head: Normocephalic and atraumatic.  Right Ear: External ear normal.  Left Ear: External ear normal.  Nose: Nose normal.  Mouth/Throat: Mucous membranes are normal. No trismus in the jaw.  Eyes: Conjunctivae and EOM are normal. No scleral icterus.  Neck: Normal range of motion and phonation normal.  Cardiovascular: Normal rate and regular rhythm.    Pulmonary/Chest: Effort normal. No stridor. No respiratory distress.  Abdominal: He exhibits no distension.  Musculoskeletal: Normal range of motion. He exhibits no edema.  Neurological: He is alert and oriented to person, place, and time.  Skin: He is not diaphoretic.  Psychiatric: He has a normal mood and affect. His behavior is normal.  Vitals reviewed.   ED Results and Treatments Labs (all labs ordered are listed, but only abnormal results are displayed) Labs Reviewed - No data to display                                                                                                                       EKG  EKG Interpretation  Date/Time:    Ventricular Rate:    PR Interval:    QRS Duration:   QT Interval:    QTC Calculation:   R Axis:     Text Interpretation:        Radiology No results found. Pertinent labs & imaging results that were available during my care of the patient were reviewed by me and considered in my medical decision making (see chart for details).  Medications Ordered in ED Medications - No data to display  Procedures Procedures  (including critical care time)  Medical Decision Making / ED Course I have reviewed the nursing notes for this encounter and the patient's prior records (if available in EHR or on provided paperwork).    Admitted to the hospitalist service for initiation of chemotherapy.  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  Despite best efforts to proofread,  errors can occur which can change the documentation meaning.   Fatima Blank, MD 11/23/17 [phone]

## 2017-11-23 NOTE — Consult Note (Signed)
HEMATOLOGY/ONCOLOGY CONSULTATION NOTE  Date of Service: 11/23/2017  Patient Care Team: Patient, No Pcp Per as PCP - General (General Practice)  CHIEF COMPLAINTS/PURPOSE OF CONSULTATION:  Myeloma/ Plasma cell leukemia.   HISTORY OF PRESENTING ILLNESS:   Andres Fernandez is a wonderful 61 y.o. male  With no significant chronic medical history with a history of BPH, tobacco abuse, alcohol abuse and previous use of cocaine.  Patient notes that he has been sober from alcohol and cocaine for 3 years and quit smoking recently.    He recently on 11/17/2017 presented to the emergency room complaining of numbness and pain in the right arm and bilateral thighs with associated weakness over the past week.  Also complaining of constipation, nausea, and vomiting x2.  Patient noted no fevers, chills, night sweats.  Has indicated significant weight loss with at least 15 pounds over the past several weeks.  Appetite has been diminished, and patient has been forcing himself to eat more food than he would normally feel like to.    CT of the head without contrast obtained for possible stroke demonstrate no evidence of intracranial process, but showed diffuse osseous and calvarial lytic lesions consistent with possible multiple myeloma.  Additional lab work demonstrated white blood cell count of 36.4, with absolute lymphocyte count of 28.7, hemoglobin of 10.9 and platelets of 210.  Patient's protein level was 7.3 with albumin of 3.9, calcium of 14.0 and creatinine of 6.3.  No baseline lab values available for creatinine.  Patient had no M spike on his SPEP but had significantly elevated serum free kappa chains of 27,000 with very abnormal kappa lambda light chain ratio.  He had a skeletal survey Lytic lesions scalloping the inner cortex of the axillary and appendicular skeleton including the skull, clavicles, humeri, femurs and distal long bones.  His hypercalcemia was treated with IV fluids and pamidronate 30  mg and dexamethasone and resolved.  He had prophylactic right femoral IM nailing to prevent pathologic fracture.  His acute renal failure with a creatinine of 6.3 improved somewhat to 3.5 on discharge. He had anemia with a hemoglobin of 8 after IV hydration.  He was also noted to have leukocytosis.  He had a bone marrow biopsy results of which were pending on discharge.  Patient was discharged home on 11/22/2017 with home physical therapy.   His bone marrow biopsy was discussed today with Dr. Melina Copa and was noted to have 95% involvement by kappa restricted plasma cells.  He was also noted to have more than 20% plasma cells in the peripheral blood suggesting concern for plasma cell leukemia. Given concerns for potential worsening renal failure, endorgan plasma cell infiltration and likely concern for tumor lysis syndrome with initial treatment the patient was recommended to go to the emergency room for admission to initiate treatment for his newly diagnosed multiple myeloma/plasma cell leukemia.  I met the patient is wife and family in the emergency room at [facility] and discussed in details the current available data, diagnosis, clinical concerns, reason for admission and initiating therapy soon, prognosis and treatment particulars.  All the questions were answered in detail and they are agreeable with this plan.  Patient understands that he might need transfusions and an informed consent was obtained for these.   MEDICAL HISTORY:  Past Medical History:  Diagnosis Date  . BPH (benign prostatic hyperplasia)   . Tobacco abuse     SURGICAL HISTORY: Past Surgical History:  Procedure Laterality Date  . APPENDECTOMY    .  FEMUR IM NAIL Right 11/20/2017   Procedure: RIGHT FEMORAL INTRAMEDULLARY (IM) NAIL;  Surgeon: Nicholes Stairs, MD;  Location: [place];  Service: Orthopedics;  Laterality: Right;    SOCIAL HISTORY: Social History   Socioeconomic History  . Marital status:  Single    Spouse name: Not on file  . Number of children: Not on file  . Years of education: Not on file  . Highest education level: Not on file  Social Needs  . Financial resource strain: Not on file  . Food insecurity - worry: Not on file  . Food insecurity - inability: Not on file  . Transportation needs - medical: Not on file  . Transportation needs - non-medical: Not on file  Occupational History  . Not on file  Tobacco Use  . Smoking status: Current Every Day Smoker    Packs/day: 0.50    Types: Cigarettes  . Smokeless tobacco: Never Used  . Tobacco comment: no cigarettes x 1 week.  Substance and Sexual Activity  . Alcohol use: Yes  . Drug use: No  . Sexual activity: Not on file  Other Topics Concern  . Not on file  Social History Narrative  . Not on file    FAMILY HISTORY: Family History  Problem Relation Age of Onset  . COPD Mother   . Liver cancer Mother     ALLERGIES:  has No Known Allergies.  MEDICATIONS:  No current facility-administered medications for this encounter.    Current Outpatient Medications  Medication Sig Dispense Refill  . amLODipine (NORVASC) 2.5 MG tablet Take 1 tablet (2.5 mg total) by mouth daily. 30 tablet 0  . Multiple Vitamin (MULTIVITAMIN WITH MINERALS) TABS tablet Take 1 tablet by mouth daily.    [name] oxyCODONE (OXY IR/ROXICODONE) 5 MG immediate release tablet Take 1 tablet (5 mg total) by mouth every 6 (six) hours as needed for up to 14 days for moderate pain. 56 tablet 0    REVIEW OF SYSTEMS:    10 Point review of Systems was done is negative except as noted above.  PHYSICAL EXAMINATION: ECOG PERFORMANCE STATUS: 2 - Symptomatic, <50% confined to bed  . Vitals:   11/23/17 1440  BP: 128/68  Pulse: 95  Resp: 18  Temp: 98.5 F (36.9 C)   Filed Weights   11/23/17 1443  Weight: 144 lb (65.3 kg)   .Body mass index is 21.9 kg/m.  GENERAL:alert, in no acute distress and comfortable SKIN: no acute rashes, no significant  lesions EYES: conjunctiva are pink and non-injected, sclera anicteric OROPHARYNX: MMM, no exudates, no oropharyngeal erythema or ulceration NECK: supple, no JVD LYMPH:  no palpable lymphadenopathy in the cervical, axillary or inguinal regions LUNGS: clear to auscultation b/l with normal respiratory effort HEART: regular rate & rhythm ABDOMEN:  normoactive bowel sounds , non tender, not distended. Extremity: no pedal edema PSYCH: alert & oriented x 3 with fluent speech NEURO: no focal motor/sensory deficits  LABORATORY DATA:  I have reviewed the data as listed  . CBC Latest Ref Rng & Units 11/22/2017 11/20/2017 11/19/2017  WBC 4.0 - 10.5 K/uL 43.2(H) 31.7(H) 38.6(H)  Hemoglobin 13.0 - 17.0 g/dL 8.3(L) 9.5(L) 9.4(L)  Hematocrit 39.0 - 52.0 % 23.1(L) 27.0(L) 26.8(L)  Platelets 150 - 400 K/uL 179 185 200    . CMP Latest Ref Rng & Units 11/22/2017 11/21/2017 11/20/2017  Glucose 65 - 99 mg/dL 119(H) 131(H) 137(H)  BUN 6 - 20 mg/dL 54(H) 54(H) 51(H)  Creatinine 0.61 - 1.24 mg/dL 3.65(H)  3.89(H) 4.31(H)  Sodium 135 - 145 mmol/L 138 134(L) 133(L)  Potassium 3.5 - 5.1 mmol/L 5.0 5.0 4.7  Chloride 101 - 111 mmol/L 113(H) 109 107  CO2 22 - 32 mmol/L 16(L) 16(L) 18(L)  Calcium 8.9 - 10.3 mg/dL 8.5(L) 8.7(L) 8.9  Total Protein 6.5 - 8.1 g/dL - - -  Total Bilirubin 0.3 - 1.2 mg/dL - - -  Alkaline Phos 38 - 126 U/L - - -  AST 15 - 41 U/L - - -  ALT 17 - 63 U/L - - -   Component     Latest Ref Rng & Units 11/17/2017  Prostatic Specific Antigen     0.00 - 4.00 ng/mL 0.83  HIV Screen 4th Generation wRfx     Non Reactive Non Reactive  TSH     0.350 - 4.500 uIU/mL 1.874  Beta-2 Microglobulin     0.6 - 2.4 mg/L 25.7 (H)         RADIOGRAPHIC STUDIES: I have personally reviewed the radiological images as listed and agreed with the findings in the report. Dg Chest 2 View  Result Date: 11/17/2017 CLINICAL DATA:  61 year old male with chest pain. EXAM: CHEST  2 VIEW COMPARISON:  Chest  radiograph dated 05/10/2015 FINDINGS: Mild right lung base atelectatic changes/ scarring. There is no focal consolidation, pleural effusion, or pneumothorax. The cardiac silhouette is within normal limits. There is mild degenerative changes of the spine. No acute osseous pathology. IMPRESSION: No active cardiopulmonary disease. Electronically Signed   By: Anner Crete M.D.   On: 11/17/2017 07:08   Ct Head Wo Contrast  Result Date: 11/17/2017 CLINICAL DATA:  61 year old male with numbness and tingling. Paraplegia. EXAM: CT HEAD WITHOUT CONTRAST TECHNIQUE: Contiguous axial images were obtained from the base of the skull through the vertex without intravenous contrast. COMPARISON:  Facial bone CT dated 03/27/2010 FINDINGS: Brain: The ventricles and sulci appropriate size for patient's age. Minimal periventricular and deep white matter chronic microvascular ischemic changes noted. There is no acute intracranial hemorrhage. No mass effect or midline shift noted. No extra-axial fluid collection. Vascular: There is slight high attenuation of the right MCA in the sylvian fissure (series 7 image 12). This may be chronic. If there is clinical concern for right MCA territory ischemia further evaluation with CT angiography of the head or MRI is recommended. Skull: There are innumerable lucent calvarial lesions concerning for multiple myeloma or metastatic disease. Correlation with history of known malignancy recommended. No acute fracture. Sinuses/Orbits: There is complete opacification of the visualized right maxillary sinus. Mild mucoperiosteal thickening of paranasal sinuses. There is a lytic lesion involving the posterior right mastoid versus effusion. Other: None IMPRESSION: 1. No acute intracranial hemorrhage. 2. Slightly high attenuation of the right MCA in the right sylvian fissure which may be chronic. Further evaluation with CTA or MRI is recommended if there is high clinical concern for acute right MCA  territory ischemia. 3. 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 Correlation with history of malignancy recommended. Electronically Signed   By: Anner Crete M.D.   On: 11/17/2017 06:05   US Renal  Result Date: 11/17/2017 CLINICAL DATA:  Acute renal injury EXAM: RENAL / URINARY TRACT ULTRASOUND COMPLETE COMPARISON:  None. FINDINGS: Right Kidney: Length: 10.3 cm. Increased cortical echogenicity. No hydronephrosis. Than renal cortex. Left Kidney: Length: 10.2 cm. Increased cortical echogenicity. No hydronephrosis. Thinned renal cortex. Bladder: Appears normal for degree of bladder distention. IMPRESSION: 1. No hydronephrosis. 2. Increased cortical echogenicity and thinned renal cortex  suggesting medical renal disease . Electronically Signed   By: Suzy Bouchard M.D.   On: 11/17/2017 08:32   Ct Femur Left Wo Contrast  Result Date: 11/18/2017 CLINICAL DATA:  Bilateral upper leg pain, worse on the right for 2 weeks. Diffuse weakness. Lytic lesions in the femurs on osseous survey. EXAM: CT OF THE LOWER LEFT EXTREMITY WITHOUT CONTRAST TECHNIQUE: Multidetector CT imaging of the lower left extremity was performed according to the standard protocol. COMPARISON:  Osseous survey 11/17/2017. FINDINGS: Bones/Joint/Cartilage No fracture is identified on the right or left. Multiple foci of endosteal scalloping are seen in the femurs bilaterally consistent with lytic lesions on plain films. Tumor burden appears fairly symmetric on the right and left. Lytic lesions are also identified in the right and left acetabuli. On the left, cortical disruption is seen adjacent to the femoral head in the anterior wall measuring 1.4 cm AP, extending 0.9 cm into bone medially and measuring 2.2 cm craniocaudal. The inner cortical wall is not disrupted. On the right, lysis extends across the medial wall of the acetabulum where there is cortical destruction adjacent to the femoral head measuring 3.5 cm AP AP by 2.0 cm  craniocaudal. The inner cortical wall is not disrupted. Multiple foci of fatty marrow replacement are identified in both femurs consistent with tumor deposits. Ligaments Suboptimally assessed by CT. Muscles and Tendons Intact and normal appearance. Soft tissues No acute abnormality. IMPRESSION: Negative for fracture. Multiple lytic lesions are present throughout both femurs with an appearance most consistent with multiple myeloma. Lytic lesions are also seen in the walls of the acetabuli bilaterally most consistent with multiple myeloma. As described above, the inner cortical wall of both the right and left acetabulum is preserved with destruction of the outer cortical wall identified. Electronically Signed   By: Inge Rise M.D.   On: 11/18/2017 10:24   Ct Femur Right Wo Contrast  Result Date: 11/18/2017 CLINICAL DATA:  Bilateral upper leg pain, worse on the right for 2 weeks. Diffuse weakness. Lytic lesions in the femurs on osseous survey. EXAM: CT OF THE LOWER LEFT EXTREMITY WITHOUT CONTRAST TECHNIQUE: Multidetector CT imaging of the lower left extremity was performed according to the standard protocol. COMPARISON:  Osseous survey 11/17/2017. FINDINGS: Bones/Joint/Cartilage No fracture is identified on the right or left. Multiple foci of endosteal scalloping are seen in the femurs bilaterally consistent with lytic lesions on plain films. Tumor burden appears fairly symmetric on the right and left. Lytic lesions are also identified in the right and left acetabuli. On the left, cortical disruption is seen adjacent to the femoral head in the anterior wall measuring 1.4 cm AP, extending 0.9 cm into bone medially and measuring 2.2 cm craniocaudal. The inner cortical wall is not disrupted. On the right, lysis extends across the medial wall of the acetabulum where there is cortical destruction adjacent to the femoral head measuring 3.5 cm AP AP by 2.0 cm craniocaudal. The inner cortical wall is not disrupted.  Multiple foci of fatty marrow replacement are identified in both femurs consistent with tumor deposits. Ligaments Suboptimally assessed by CT. Muscles and Tendons Intact and normal appearance. Soft tissues No acute abnormality. IMPRESSION: Negative for fracture. Multiple lytic lesions are present throughout both femurs with an appearance most consistent with multiple myeloma. Lytic lesions are also seen in the walls of the acetabuli bilaterally most consistent with multiple myeloma. As described above, the inner cortical wall of both the right and left acetabulum is preserved with destruction of the outer  cortical wall identified. Electronically Signed   By: Inge Rise M.D.   On: 11/18/2017 10:24   Dg Bone Survey Met  Result Date: 11/17/2017 CLINICAL DATA:  He is complaining of numbness in his distal arms and legs which has been present for the last 2 weeks. It is been generally getting worse. He also has noted a painful knot in the right rib cage. He denies fever, chills, sweats.*comment was truncated* in a have a EXAM: METASTATIC BONE SURVEY COMPARISON:  Head -CT 11/17/2017 FINDINGS: Skull: Multiple lytic lesions throughout the calvarium. Lytic lesions scalloped the cortex of the LEFT and RIGHT humeri and clavicles. Lytic lesions in the radius and ulna of the forearms Lytic lesions scalloping enter cortex of the LEFT RIGHT femur. Lytic lesions within the fibulae. IMPRESSION: 1. Lytic lesions scalloping the inner cortex of the axillary and appendicular skeleton including the skull, clavicles, humeri, femurs and distal long bones. 2. Findings most suggestive multiple myeloma. Electronically Signed   By: Suzy Bouchard M.D.   On: 11/17/2017 09:50   Ct Bone Marrow Biopsy & Aspiration  Result Date: 11/20/2017 INDICATION: 61 year old male with a history of multiple myeloma EXAM: CT BONE MARROW BIOPSY AND ASPIRATION MEDICATIONS: None. ANESTHESIA/SEDATION: Moderate (conscious) sedation was employed during  this procedure. A total of Versed 2.0 mg and Fentanyl 2 Hunter mcg was administered intravenously. Moderate Sedation Time: 11 minutes. The patient's level of consciousness and vital signs were monitored continuously by radiology nursing throughout the procedure under my direct supervision. FLUOROSCOPY TIME:  CT COMPLICATIONS: None PROCEDURE: The procedure risks, benefits, and alternatives were explained to the patient. Questions regarding the procedure were encouraged and answered. The patient understands and consents to the procedure. Scout CT of the pelvis was performed for surgical planning purposes. The posterior pelvis was prepped with chlorhexidinein a sterile fashion, and a sterile drape was applied covering the operative field. A sterile gown and sterile gloves were used for the procedure. Local anesthesia was provided with 1% Lidocaine. We targeted the left posterior iliac bone for biopsy. The skin and subcutaneous tissues were infiltrated with 1% lidocaine without epinephrine. A small stab incision was made with an 11 blade scalpel, and an 11 gauge Murphy needle was advanced with CT guidance to the posterior cortex. Manual forced was used to advance the needle through the posterior cortex and the stylet was removed. A bone marrow aspirate was retrieved and passed to a cytotechnologist in the room. The Murphy needle was then advanced without the stylet for a core biopsy. The core biopsy was retrieved and also passed to a cytotechnologist. Manual pressure was used for hemostasis and a sterile dressing was placed. No complications were encountered no significant blood loss was encountered. Patient tolerated the procedure well and remained hemodynamically stable throughout. IMPRESSION: Status post CT-guided bone marrow biopsy, with tissue specimen sent to pathology for complete histopathologic analysis Signed, Dulcy Fanny. Earleen Newport, DO [facility] Radiology  Electronically Signed   By: Corrie Mckusick D.O.   On: 11/20/2017 10:06   Dg C-arm 1-60 Min  Result Date: 11/20/2017 CLINICAL DATA:  Status post inter medullary nail and screw placement for impending right femur fracture due to metastatic disease. Multiple myeloma. EXAM: RIGHT FEMUR PORTABLE 2 VIEW; DG C-ARM 61-120 MIN COMPARISON:  CT scan of the femurs of November 17, 2017 FINDINGS: Fluoro time reported is 1 minutes, 24 seconds. Two fluoro spot images are observed. An intramedullary rod within the femoral shaft present. There are 2 telescoping screws  in the right femoral neck and head and securing screw in the lower right femoral shaft. There is no immediate postprocedure complication. IMPRESSION: No immediate postprocedure complication following intramedullary nail placement of the right femur. Electronically Signed   By: David  Martinique M.D.   On: 11/20/2017 13:41   Dg Femur Port, Min 2 Views Right  Result Date: 11/20/2017 CLINICAL DATA:  Status post inter medullary nail and screw placement for impending right femur fracture due to metastatic disease. Multiple myeloma. EXAM: RIGHT FEMUR PORTABLE 2 VIEW; DG C-ARM 61-120 MIN COMPARISON:  CT scan of the femurs of November 17, 2017 FINDINGS: Fluoro time reported is 1 minutes, 24 seconds. Two fluoro spot images are observed. An intramedullary rod within the femoral shaft present. There are 2 telescoping screws in the right femoral neck and head and securing screw in the lower right femoral shaft. There is no immediate postprocedure complication. IMPRESSION: No immediate postprocedure complication following intramedullary nail placement of the right femur. Electronically Signed   By: David  Martinique M.D.   On: 11/20/2017 13:41    ASSESSMENT & PLAN:   61 y.o. AA male presenting with severe hypercalcemia, skeletal findings consistent with myeloma and significant leukocytosis/lymphocytosis in the peripheral bloodas well as AKI and hypercalcemia.  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  Prelim- BM Bx showed 90-95% involvement with kappa restricted serum free light chains. 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  2) s/p Prophylactic IM nailing for rt femur.  3) high risk of tumor lysis syndrome with initial treatment.  4) h/o tobacco abuse  5) h/o cocaine and ETOH abuse -- sober for 3 yrs  6) AKI vs ARF on CKD -- due to myeloma.    PLAN -I discussed in detail with the patient and his accompanying family in the emergency room his new diagnosis, natural history, prognosis, concerning features of the disease and treatment plan and options and details. -Patient and family had several questions which were answered in details. -given concern for significant renal insufficiency, high risk of tumor lysis syndrome, aggressive plasma cell leukemia-like presentation of his myeloma would admit and initiate treatment as inpatient. -Uric acid baseline labs and daily  -renally adjusted allopurinol and as needed rasburicase for management of possible tumor lysis syndrome -Daily CBC with differential and CMP -We will plan to start the patient on dexamethasone plus Velcade and add cyclophosphamide (eventually CyborD regimen) -IV hydration. -We will need bisphosphonates every 4 weeks or for treatment of hypercalcemia.  Will prefer pamidronate given his renal insufficiency.   -Transfuse RBCs as needed for hemoglobin less than or equal to 8  -will f/u on final BM Bx results and Cytogenetics. (Discussed Prelim results with Dr Melina Copa -pathology)  6) multiple myeloma related skeletal pain -Oxycodone 5-10 mg every 6 hours as needed -Senna S for bowel prophylaxis  I shall continue to follow daily.  All of the patients questions were answered with apparent  satisfaction. The patient knows to call the clinic with any problems, questions or concerns.  I spent 60 minutes counseling the patient face to face. The total time spent in the appointment was 80 minutes and more than 50% was on counseling and direct patient cares.    Sullivan Lone MD [place] AAHIVMS [facility] Hematology/Oncology Physician [place]  (Office):  [phone] (Work cell):  [phone] (Fax):           [phone]  11/23/2017 3:29 PM

## 2017-11-24 DIAGNOSIS — C9010 Plasma cell leukemia not having achieved remission: Secondary | ICD-10-CM

## 2017-11-24 DIAGNOSIS — D649 Anemia, unspecified: Secondary | ICD-10-CM

## 2017-11-24 DIAGNOSIS — E883 Tumor lysis syndrome: Secondary | ICD-10-CM

## 2017-11-24 DIAGNOSIS — Z5111 Encounter for antineoplastic chemotherapy: Principal | ICD-10-CM

## 2017-11-24 LAB — COMPREHENSIVE METABOLIC PANEL
ALT: 25 U/L (ref 17–63)
ANION GAP: 6 (ref 5–15)
AST: 29 U/L (ref 15–41)
Albumin: 3 g/dL — ABNORMAL LOW (ref 3.5–5.0)
Alkaline Phosphatase: 53 U/L (ref 38–126)
BILIRUBIN TOTAL: 0.6 mg/dL (ref 0.3–1.2)
BUN: 53 mg/dL — ABNORMAL HIGH (ref 6–20)
CO2: 19 mmol/L — ABNORMAL LOW (ref 22–32)
Calcium: 8 mg/dL — ABNORMAL LOW (ref 8.9–10.3)
Chloride: 111 mmol/L (ref 101–111)
Creatinine, Ser: 3.87 mg/dL — ABNORMAL HIGH (ref 0.61–1.24)
GFR, EST AFRICAN AMERICAN: 18 mL/min — AB (ref >60)
GFR, EST NON AFRICAN AMERICAN: 16 mL/min — AB (ref >60)
Glucose, Bld: 139 mg/dL — ABNORMAL HIGH (ref 65–99)
POTASSIUM: 5.9 mmol/L — AB (ref 3.5–5.1)
Sodium: 136 mmol/L (ref 135–145)
TOTAL PROTEIN: 5.9 g/dL — AB (ref 6.5–8.1)

## 2017-11-24 LAB — CALCIUM, IONIZED: CALCIUM, IONIZED, SERUM: 4.8 mg/dL (ref 4.5–5.6)

## 2017-11-24 LAB — CBC WITH DIFFERENTIAL/PLATELET
BASOS PCT: 1 %
Basophils Absolute: 0.4 10*3/uL — ABNORMAL HIGH (ref 0.0–0.1)
EOS PCT: 1 %
Eosinophils Absolute: 0.4 10*3/uL (ref 0.0–0.7)
HEMATOCRIT: 20.2 % — AB (ref 39.0–52.0)
Hemoglobin: 7 g/dL — ABNORMAL LOW (ref 13.0–17.0)
LYMPHS ABS: 28.9 10*3/uL — AB (ref 0.7–4.0)
Lymphocytes Relative: 68 %
MCH: 30.6 pg (ref 26.0–34.0)
MCHC: 34.7 g/dL (ref 30.0–36.0)
MCV: 88.2 fL (ref 78.0–100.0)
MONO ABS: 0.8 10*3/uL (ref 0.1–1.0)
Monocytes Relative: 2 %
Neutro Abs: 11.9 10*3/uL — ABNORMAL HIGH (ref 1.7–7.7)
Neutrophils Relative %: 28 %
Platelets: 212 10*3/uL (ref 150–400)
RBC: 2.29 MIL/uL — AB (ref 4.22–5.81)
RDW: 14.7 % (ref 11.5–15.5)
WBC: 42.4 10*3/uL — AB (ref 4.0–10.5)

## 2017-11-24 LAB — URIC ACID: URIC ACID, SERUM: 6.3 mg/dL (ref 4.4–7.6)

## 2017-11-24 LAB — BASIC METABOLIC PANEL
ANION GAP: 11 (ref 5–15)
BUN: 55 mg/dL — ABNORMAL HIGH (ref 6–20)
CALCIUM: 7.9 mg/dL — AB (ref 8.9–10.3)
CO2: 18 mmol/L — ABNORMAL LOW (ref 22–32)
Chloride: 111 mmol/L (ref 101–111)
Creatinine, Ser: 3.86 mg/dL — ABNORMAL HIGH (ref 0.61–1.24)
GFR, EST AFRICAN AMERICAN: 18 mL/min — AB (ref >60)
GFR, EST NON AFRICAN AMERICAN: 16 mL/min — AB (ref >60)
Glucose, Bld: 126 mg/dL — ABNORMAL HIGH (ref 65–99)
POTASSIUM: 4.4 mmol/L (ref 3.5–5.1)
Sodium: 140 mmol/L (ref 135–145)

## 2017-11-24 LAB — ABO/RH: ABO/RH(D): B POS

## 2017-11-24 LAB — HEPATITIS B CORE ANTIBODY, TOTAL: Hep B Core Total Ab: NEGATIVE

## 2017-11-24 LAB — HEPATITIS B SURFACE ANTIGEN: Hepatitis B Surface Ag: NEGATIVE

## 2017-11-24 LAB — PREPARE RBC (CROSSMATCH)

## 2017-11-24 MED ORDER — SODIUM CHLORIDE 0.9 % IV SOLN: 250.0000 mL | Freq: Once | INTRAVENOUS | Status: DC

## 2017-11-24 MED ORDER — ACETAMINOPHEN 325 MG PO TABS
650.0000 mg | ORAL_TABLET | Freq: Once | ORAL | Status: AC
  Administered 2017-11-24: 650 mg via ORAL
  Filled 2017-11-24: qty 2

## 2017-11-24 MED ORDER — SODIUM BICARBONATE 650 MG PO TABS
650.0000 mg | ORAL_TABLET | Freq: Three times a day (TID) | ORAL | Status: DC
  Administered 2017-11-24 – 2017-12-01 (×21): 650 mg via ORAL
  Filled 2017-11-24 (×21): qty 1

## 2017-11-24 MED ORDER — SODIUM CHLORIDE 0.9% FLUSH: 3.0000 mL | INTRAVENOUS | Status: DC | PRN

## 2017-11-24 MED ORDER — SODIUM CHLORIDE 0.9 % IV BOLUS (SEPSIS): 500.0000 mL | Freq: Once | INTRAVENOUS | Status: DC

## 2017-11-24 MED ORDER — HEPARIN SOD (PORK) LOCK FLUSH 100 UNIT/ML IV SOLN
500.0000 [IU] | Freq: Every day | INTRAVENOUS | Status: DC | PRN
  Filled 2017-11-24: qty 5

## 2017-11-24 MED ORDER — HEPARIN SOD (PORK) LOCK FLUSH 100 UNIT/ML IV SOLN
250.0000 [IU] | INTRAVENOUS | Status: DC | PRN
  Filled 2017-11-24: qty 2.5

## 2017-11-24 MED ORDER — DEXAMETHASONE 4 MG PO TABS
20.0000 mg | ORAL_TABLET | Freq: Every day | ORAL | Status: DC
  Administered 2017-11-25: 20 mg via ORAL
  Filled 2017-11-24: qty 5

## 2017-11-24 MED ORDER — SODIUM POLYSTYRENE SULFONATE 15 GM/60ML PO SUSP
30.0000 g | Freq: Once | ORAL | Status: AC
  Administered 2017-11-24: 30 g via ORAL
  Filled 2017-11-24: qty 120

## 2017-11-24 MED ORDER — SODIUM CHLORIDE 0.9% FLUSH: 10.0000 mL | INTRAVENOUS | Status: DC | PRN

## 2017-11-24 MED ORDER — BORTEZOMIB CHEMO SQ INJECTION 3.5 MG (2.5MG/ML)
1.3000 mg/m2 | Freq: Once | INTRAMUSCULAR | Status: DC
  Filled 2017-11-24: qty 0.9

## 2017-11-24 NOTE — Progress Notes (Signed)
[facility] PROGRESS NOTE  RAYMON SCHLARB PPI:[id number] DOB: 1956/02/03 DOA: 11/23/2017  PCP: Patient, No Pcp Per  Brief History/Interval Summary: 61 year old male with recent hospitalization where he was found to have lytic lesions.  He also had an impending pathological fracture in the right femur requiring intramedullary nail placement by orthopedics.  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  Patient was hospitalized and started on chemotherapy.  Reason for Visit: Plasma cell leukemia  Consultants: Oncology  Procedures: None yet  Antibiotics: None  Subjective/Interval History: Patient states that he is feeling well this morning.  Denies any shortness of breath or chest pain.  Denies any nausea vomiting.  Complains of some pain in his rib cage area.  ROS: Denies any headaches.  Objective:  Vital Signs  Vitals:   11/23/17 1443 11/23/17 1936 11/23/17 2056 11/24/17 0519  BP:  126/81 129/60 110/69  Pulse:  97 100 83  Resp:  18 16 18   Temp:  98.6 F (37 C) 98.2 F (36.8 C) 98.4 F (36.9 C)  TempSrc:  Oral Oral Oral  SpO2:  97% 97% 98%  Weight: 65.3 kg (144 lb)     Height: 5' 8"  (1.727 m)       Intake/Output Summary (Last 24 hours) at 11/24/2017 1310 Last data filed at 11/24/2017 1036 Gross per 24 hour  Intake 240 ml  Output 1250 ml  Net -1010 ml   Filed Weights   11/23/17 1443  Weight: 65.3 kg (144 lb)    General appearance: alert, cooperative, appears stated age and no distress Resp: clear to auscultation bilaterally Cardio: regular rate and rhythm, S1, S2 normal, no murmur, click, rub or gallop GI: soft, non-tender; bowel sounds normal; no masses,  no organomegaly Extremities: extremities normal, atraumatic, no cyanosis or edema Neurologic: Awake alert.  Oriented x3.  No focal neurological deficits.  Lab Results:  Data Reviewed: I have personally reviewed following labs and  imaging studies  CBC: Recent Labs  Lab 11/19/17 0227 11/20/17 0150 11/22/17 0405 11/23/17 1800 11/24/17 0344  WBC 38.6* 31.7* 43.2* 55.3* 42.4*  NEUTROABS 5.8  --  16.8* 13.3* 11.9*  HGB 9.4* 9.5* 8.3* 8.1* 7.0*  HCT 26.8* 27.0* 23.1* 23.0* 20.2*  MCV 89.9 89.4 87.8 88.8 88.2  PLT 200 185 179 213 063    Basic Metabolic Panel: Recent Labs  Lab 11/19/17 0227 11/20/17 0150 11/21/17 0340 11/22/17 0405 11/23/17 1800 11/24/17 0344  NA 135 133* 134* 138 139 136  K 4.5 4.7 5.0 5.0 4.8 5.9*  CL 105 107 109 113* 113* 111  CO2 20* 18* 16* 16* 19* 19*  GLUCOSE 159* 137* 131* 119* 110* 139*  BUN 47* 51* 54* 54* 52* 53*  CREATININE 5.28* 4.31* 3.89* 3.65* 3.80* 3.87*  CALCIUM 9.8 8.9 8.7* 8.5* 8.3* 8.0*  MG 2.3  --   --   --   --   --   PHOS  --  3.5 3.7 2.8  --   --     GFR: Estimated Creatinine Clearance: 18.7 mL/min (A) (by C-G formula based on SCr of 3.87 mg/dL (H)).  Liver Function Tests: Recent Labs  Lab 11/18/17 0318 11/19/17 0227 11/20/17 0150 11/21/17 0340 11/22/17 0405 11/23/17 1800 11/24/17 0344  AST 26 21  --   --   --  35 29  ALT 26 23  --   --   --  27 25  ALKPHOS 63 61  --   --   --  58 53  BILITOT 0.5 0.4  --   --   --  0.5 0.6  PROT 6.4* 6.4*  --   --   --  6.3* 5.9*  ALBUMIN 3.5 3.4* 3.1* 3.1* 3.0* 3.2* 3.0*    Coagulation Profile: Recent Labs  Lab 11/19/17 0227  INR 1.11    Anemia Panel: Recent Labs    11/23/17 1800  RETICCTPCT 1.3    Recent Results (from the past 240 hour(s))  Surgical pcr screen     Status: None   Collection Time: 11/20/17  6:15 AM  Result Value Ref Range Status   MRSA, PCR NEGATIVE NEGATIVE Final   Staphylococcus aureus NEGATIVE NEGATIVE Final    Comment: (NOTE) The Xpert SA Assay (FDA approved for NASAL specimens in patients 45 years of age and older), is one component of a comprehensive surveillance program. It is not intended to diagnose infection nor to guide or monitor treatment.       Radiology  Studies: No results found.   Medications:  Scheduled: . acetaminophen  650 mg Oral Once  . acyclovir  200 mg Oral Daily  . allopurinol  100 mg Oral Daily  . bortezomib SQ  1.3 mg/m2 Subcutaneous Once  . [START ON 11/25/2017] dexamethasone  20 mg Oral Daily  . heparin  5,000 Units Subcutaneous Q8H  . polyethylene glycol  17 g Oral Daily   Continuous: . sodium chloride 75 mL/hr at 11/23/17 2246  . sodium chloride    . 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  Management per oncology.  To receive bortezomib today.  Has been on dexamethasone.  Patient is at high risk for tumor lysis syndrome.  Acute renal failure versus acute renal failure on chronic kidney disease/Hyperkalemia/metabolic acidosis Most likely due to the above process.  Patient was seen by nephrology during his recent hospitalization. Patient's creatinine has improved compared to previous hospitalization.  Slightly higher today compared to yesterday.  Continue to monitor closely.  He will be given a dose of Kayexalate for hyperkalemia.  Recheck labs later today.  If creatinine continues to worsen we may need to involve nephrology.    Recent hypercalcemia The patient was given bisphosphonate during recent hospitalization.  Calcium level has improved.  Continue to monitor closely.  Recent right femoral intramedullary nail placement This was for a lytic lesion with  impending pathological fracture.  Continue pain medications.  PT and OT evaluation.  Newly diagnosed essential hypertension Patient was started on low-dose Norvasc.  We will continue to monitor blood pressure closely.  Hold his oral medication for now.  Blood pressure is reasonably well controlled at this time.  Normocytic anemia Most likely secondary to malignancy.  He also was noted to have significant leukocytosis which is also due to his cancer.  Platelet counts are normal as of now.  Hemoglobin noted to be lower today.  No evidence for overt bleeding. Might benefit from transfusion.  History of tobacco abuse Patient has not smoked since he  was last hospitalized.  Continue to emphasize smoking cessation.  Constipation Most likely secondary to pain medications.  He was started on MiraLAX.  DVT Prophylaxis: Subcutaneous heparin    Code Status: Full code Family Communication: Discussed with the patient and his sister over the phone Disposition Plan: Management as outlined above.    LOS: 1 day   [facility] Pager [phone] 11/24/2017, 1:10 PM  If 7PM-7AM, please contact night-coverage at [email or web address], password [facility]

## 2017-11-24 NOTE — Progress Notes (Signed)
[name]   HEMATOLOGY/ONCOLOGY INPATIENT PROGRESS NOTE  Date of Service: 11/24/2017  Inpatient Attending: .Bonnielee Haff, MD   SUBJECTIVE  Patient was seen this morning in follow-up for his oncologic cares.  He appears more comfortable and does not feel like he is shivering.  Was able to sleep well and is eating well.  WBC count seems to be going down with high-dose steroid with some elevation in potassium levels likely due to some element of tumor lysis.  He has been started on allopurinol.  No hypercalcemia at this time. Receiving IV fluids with good urine output. She will be getting his first dose of Velcade today.  Discussed with the pharmacist. We shall be adding the cyclophosphamide in the next day or 2 if no significant over tumor lysis noted. Discussed that his hemoglobin is down to 7 and we would recommend transfusion of 2 units of PRBCs for symptomatic anemia.    OBJECTIVE:  NAD  PHYSICAL EXAMINATION: . Vitals:   11/23/17 1443 11/23/17 1936 11/23/17 2056 11/24/17 0519  BP:  126/81 129/60 110/69  Pulse:  97 100 83  Resp:  _0 Temp:  98.6 F (37 C) 98.2 F (36.8 C) 98.4 F (36.9 C)  TempSrc:  Oral Oral Oral  SpO2:  97% 97% 98%  Weight: 144 lb (65.3 kg)     Height: _1  (1.727 m)      Filed Weights   11/23/17 1443  Weight: 144 lb (65.3 kg)   .Body mass index is 21.9 kg/m. GENERAL:alert, in no acute distress and comfortable SKIN: no acute rashes, no significant lesions EYES: conjunctiva are pink and non-injected, sclera anicteric OROPHARYNX: MMM, no exudates, no oropharyngeal erythema or ulceration NECK: supple, no JVD LYMPH:  no palpable lymphadenopathy in the cervical, axillary or inguinal regions LUNGS: clear to auscultation b/l with normal respiratory effort HEART: regular rate & rhythm ABDOMEN:  normoactive bowel sounds , non tender, not distended. Extremity: no pedal edema PSYCH: alert & oriented x 3 with fluent speech NEURO: no focal  motor/sensory deficits   MEDICAL HISTORY:  Past Medical History:  Diagnosis Date  . BPH (benign prostatic hyperplasia)   . Hypertension   . Tobacco abuse     SURGICAL HISTORY: Past Surgical History:  Procedure Laterality Date  . APPENDECTOMY    . FEMUR IM NAIL Right 11/20/2017   Procedure: RIGHT FEMORAL INTRAMEDULLARY (IM) NAIL;  Surgeon: Nicholes Stairs, MD;  Location: [facility];  Service: Orthopedics;  Laterality: Right;    SOCIAL HISTORY: Social History   Socioeconomic History  . Marital status: Single    Spouse name: Not on file  . Number of children: Not on file  . Years of education: Not on file  . Highest education level: Not on file  Social Needs  . Financial resource strain: Not on file  . Food insecurity - worry: Not on file  . Food insecurity - inability: Not on file  . Transportation needs - medical: Not on file  . Transportation needs - non-medical: Not on file  Occupational History  . Not on file  Tobacco Use  . Smoking status: Former Smoker    Packs/day: 0.50    Types: Cigarettes    Last attempt to quit: 11/09/2017    Years since quitting: 0.0  . Smokeless tobacco: Never Used  . Tobacco comment: no cigarettes x 1 week.  Substance and Sexual Activity  . Alcohol use: Yes  . Drug use: No  . Sexual activity: Not on  file  Other Topics Concern  . Not on file  Social History Narrative  . Not on file    FAMILY HISTORY: Family History  Problem Relation Age of Onset  . COPD Mother   . Liver cancer Mother     ALLERGIES:  has No Known Allergies.  MEDICATIONS:  Scheduled Meds: . acetaminophen  650 mg Oral Once  . acyclovir  200 mg Oral Daily  . allopurinol  100 mg Oral Daily  . [START ON 11/27/2017] bortezomib SQ  1.3 mg/m2 Subcutaneous Once  . heparin  5,000 Units Subcutaneous Q8H  . polyethylene glycol  17 g Oral Daily   Continuous Infusions: . sodium chloride 75 mL/hr at 11/23/17 2246  . sodium chloride    . sodium chloride     PRN  Meds:.heparin lock flush, heparin lock flush, ondansetron (ZOFRAN) IV, oxyCODONE, sodium chloride flush, sodium chloride flush  REVIEW OF SYSTEMS:    10 Point review of Systems was done is negative except as noted above.   LABORATORY DATA:  I have reviewed the data as listed  . CBC Latest Ref Rng & Units 11/24/2017 11/23/2017 11/22/2017  WBC 4.0 - 10.5 K/uL 42.4(H) 55.3(HH) 43.2(H)  Hemoglobin 13.0 - 17.0 g/dL 7.0(L) 8.1(L) 8.3(L)  Hematocrit 39.0 - 52.0 % 20.2(L) 23.0(L) 23.1(L)  Platelets 150 - 400 K/uL 212 213 179    . CMP Latest Ref Rng & Units 11/24/2017 11/23/2017 11/22/2017  Glucose 65 - 99 mg/dL 139(H) 110(H) 119(H)  BUN 6 - 20 mg/dL 53(H) 52(H) 54(H)  Creatinine 0.61 - 1.24 mg/dL 3.87(H) 3.80(H) 3.65(H)  Sodium 135 - 145 mmol/L 136 139 138  Potassium 3.5 - 5.1 mmol/L 5.9(H) 4.8 5.0  Chloride 101 - 111 mmol/L 111 113(H) 113(H)  CO2 22 - 32 mmol/L 19(L) 19(L) 16(L)  Calcium 8.9 - 10.3 mg/dL 8.0(L) 8.3(L) 8.5(L)  Total Protein 6.5 - 8.1 g/dL 5.9(L) 6.3(L) -  Total Bilirubin 0.3 - 1.2 mg/dL 0.6 0.5 -  Alkaline Phos 38 - 126 U/L 53 58 -  AST 15 - 41 U/L 29 35 -  ALT 17 - 63 U/L 25 27 -     RADIOGRAPHIC STUDIES: I have personally reviewed the radiological images as listed and agreed with the findings in the report. Dg Chest 2 View  Result Date: 11/17/2017 CLINICAL DATA:  61 year old male with chest pain. EXAM: CHEST  2 VIEW COMPARISON:  Chest radiograph dated 05/10/2015 FINDINGS: Mild right lung base atelectatic changes/ scarring. There is no focal consolidation, pleural effusion, or pneumothorax. The cardiac silhouette is within normal limits. There is mild degenerative changes of the spine. No acute osseous pathology. IMPRESSION: No active cardiopulmonary disease. Electronically Signed   By: Anner Crete M.D.   On: 11/17/2017 07:08   Ct Head Wo Contrast  Result Date: 11/17/2017 CLINICAL DATA:  61 year old male with numbness and tingling. Paraplegia. EXAM: CT HEAD  WITHOUT CONTRAST TECHNIQUE: Contiguous axial images were obtained from the base of the skull through the vertex without intravenous contrast. COMPARISON:  Facial bone CT dated 03/27/2010 FINDINGS: Brain: The ventricles and sulci appropriate size for patient's age. Minimal periventricular and deep white matter chronic microvascular ischemic changes noted. There is no acute intracranial hemorrhage. No mass effect or midline shift noted. No extra-axial fluid collection. Vascular: There is slight high attenuation of the right MCA in the sylvian fissure (series 7 image 12). This may be chronic. If there is clinical concern for right MCA territory ischemia further evaluation with CT angiography of the head  or MRI is recommended. Skull: There are innumerable lucent calvarial lesions concerning for multiple myeloma or metastatic disease. Correlation with history of known malignancy recommended. No acute fracture. Sinuses/Orbits: There is complete opacification of the visualized right maxillary sinus. Mild mucoperiosteal thickening of paranasal sinuses. There is a lytic lesion involving the posterior right mastoid versus effusion. Other: None IMPRESSION: 1. No acute intracranial hemorrhage. 2. Slightly high attenuation of the right MCA in the right sylvian fissure which may be chronic. Further evaluation with CTA or MRI is recommended if there is high clinical concern for acute right MCA territory ischemia. 3. 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 Correlation with history of malignancy recommended. Electronically Signed   By: Anner Crete M.D.   On: 11/17/2017 06:05   US Renal  Result Date: 11/17/2017 CLINICAL DATA:  Acute renal injury EXAM: RENAL / URINARY TRACT ULTRASOUND COMPLETE COMPARISON:  None. FINDINGS: Right Kidney: Length: 10.3 cm. Increased cortical echogenicity. No hydronephrosis. Than renal cortex. Left Kidney: Length: 10.2 cm. Increased cortical echogenicity. No hydronephrosis. Thinned  renal cortex. Bladder: Appears normal for degree of bladder distention. IMPRESSION: 1. No hydronephrosis. 2. Increased cortical echogenicity and thinned renal cortex suggesting medical renal disease . Electronically Signed   By: Suzy Bouchard M.D.   On: 11/17/2017 08:32   Ct Femur Left Wo Contrast  Result Date: 11/18/2017 CLINICAL DATA:  Bilateral upper leg pain, worse on the right for 2 weeks. Diffuse weakness. Lytic lesions in the femurs on osseous survey. EXAM: CT OF THE LOWER LEFT EXTREMITY WITHOUT CONTRAST TECHNIQUE: Multidetector CT imaging of the lower left extremity was performed according to the standard protocol. COMPARISON:  Osseous survey 11/17/2017. FINDINGS: Bones/Joint/Cartilage No fracture is identified on the right or left. Multiple foci of endosteal scalloping are seen in the femurs bilaterally consistent with lytic lesions on plain films. Tumor burden appears fairly symmetric on the right and left. Lytic lesions are also identified in the right and left acetabuli. On the left, cortical disruption is seen adjacent to the femoral head in the anterior wall measuring 1.4 cm AP, extending 0.9 cm into bone medially and measuring 2.2 cm craniocaudal. The inner cortical wall is not disrupted. On the right, lysis extends across the medial wall of the acetabulum where there is cortical destruction adjacent to the femoral head measuring 3.5 cm AP AP by 2.0 cm craniocaudal. The inner cortical wall is not disrupted. Multiple foci of fatty marrow replacement are identified in both femurs consistent with tumor deposits. Ligaments Suboptimally assessed by CT. Muscles and Tendons Intact and normal appearance. Soft tissues No acute abnormality. IMPRESSION: Negative for fracture. Multiple lytic lesions are present throughout both femurs with an appearance most consistent with multiple myeloma. Lytic lesions are also seen in the walls of the acetabuli bilaterally most consistent with multiple myeloma. As  described above, the inner cortical wall of both the right and left acetabulum is preserved with destruction of the outer cortical wall identified. Electronically Signed   By: Inge Rise M.D.   On: 11/18/2017 10:24   Ct Femur Right Wo Contrast  Result Date: 11/18/2017 CLINICAL DATA:  Bilateral upper leg pain, worse on the right for 2 weeks. Diffuse weakness. Lytic lesions in the femurs on osseous survey. EXAM: CT OF THE LOWER LEFT EXTREMITY WITHOUT CONTRAST TECHNIQUE: Multidetector CT imaging of the lower left extremity was performed according to the standard protocol. COMPARISON:  Osseous survey 11/17/2017. FINDINGS: Bones/Joint/Cartilage No fracture is identified on the right or left. Multiple foci of endosteal  scalloping are seen in the femurs bilaterally consistent with lytic lesions on plain films. Tumor burden appears fairly symmetric on the right and left. Lytic lesions are also identified in the right and left acetabuli. On the left, cortical disruption is seen adjacent to the femoral head in the anterior wall measuring 1.4 cm AP, extending 0.9 cm into bone medially and measuring 2.2 cm craniocaudal. The inner cortical wall is not disrupted. On the right, lysis extends across the medial wall of the acetabulum where there is cortical destruction adjacent to the femoral head measuring 3.5 cm AP AP by 2.0 cm craniocaudal. The inner cortical wall is not disrupted. Multiple foci of fatty marrow replacement are identified in both femurs consistent with tumor deposits. Ligaments Suboptimally assessed by CT. Muscles and Tendons Intact and normal appearance. Soft tissues No acute abnormality. IMPRESSION: Negative for fracture. Multiple lytic lesions are present throughout both femurs with an appearance most consistent with multiple myeloma. Lytic lesions are also seen in the walls of the acetabuli bilaterally most consistent with multiple myeloma. As described above, the inner cortical wall of both the  right and left acetabulum is preserved with destruction of the outer cortical wall identified. Electronically Signed   By: Inge Rise M.D.   On: 11/18/2017 10:24   Dg Bone Survey Met  Result Date: 11/17/2017 CLINICAL DATA:  He is complaining of numbness in his distal arms and legs which has been present for the last 2 weeks. It is been generally getting worse. He also has noted a painful knot in the right rib cage. He denies fever, chills, sweats.*comment was truncated* in a have a EXAM: METASTATIC BONE SURVEY COMPARISON:  Head -CT 11/17/2017 FINDINGS: Skull: Multiple lytic lesions throughout the calvarium. Lytic lesions scalloped the cortex of the LEFT and RIGHT humeri and clavicles. Lytic lesions in the radius and ulna of the forearms Lytic lesions scalloping enter cortex of the LEFT RIGHT femur. Lytic lesions within the fibulae. IMPRESSION: 1. Lytic lesions scalloping the inner cortex of the axillary and appendicular skeleton including the skull, clavicles, humeri, femurs and distal long bones. 2. Findings most suggestive multiple myeloma. Electronically Signed   By: Suzy Bouchard M.D.   On: 11/17/2017 09:50   Ct Bone Marrow Biopsy & Aspiration  Result Date: 11/20/2017 INDICATION: 60 year old male with a history of multiple myeloma EXAM: CT BONE MARROW BIOPSY AND ASPIRATION MEDICATIONS: None. ANESTHESIA/SEDATION: Moderate (conscious) sedation was employed during this procedure. A total of Versed 2.0 mg and Fentanyl 2 Hunter mcg was administered intravenously. Moderate Sedation Time: 11 minutes. The patient's level of consciousness and vital signs were monitored continuously by radiology nursing throughout the procedure under my direct supervision. FLUOROSCOPY TIME:  CT COMPLICATIONS: None PROCEDURE: The procedure risks, benefits, and alternatives were explained to the patient. Questions regarding the procedure were encouraged and answered. The patient understands and consents to the procedure.  Scout CT of the pelvis was performed for surgical planning purposes. The posterior pelvis was prepped with chlorhexidinein a sterile fashion, and a sterile drape was applied covering the operative field. A sterile gown and sterile gloves were used for the procedure. Local anesthesia was provided with 1% Lidocaine. We targeted the left posterior iliac bone for biopsy. The skin and subcutaneous tissues were infiltrated with 1% lidocaine without epinephrine. A small stab incision was made with an 11 blade scalpel, and an 11 gauge Murphy needle was advanced with CT guidance to the posterior cortex. Manual forced was used to advance the needle through the  posterior cortex and the stylet was removed. A bone marrow aspirate was retrieved and passed to a cytotechnologist in the room. The Murphy needle was then advanced without the stylet for a core biopsy. The core biopsy was retrieved and also passed to a cytotechnologist. Manual pressure was used for hemostasis and a sterile dressing was placed. No complications were encountered no significant blood loss was encountered. Patient tolerated the procedure well and remained hemodynamically stable throughout. IMPRESSION: Status post CT-guided bone marrow biopsy, with tissue specimen sent to pathology for complete histopathologic analysis Signed, Dulcy Fanny. Earleen Newport, DO [facility] Electronically Signed   By: Corrie Mckusick D.O.   On: 11/20/2017 10:06   Dg C-arm 1-60 Min  Result Date: 11/20/2017 CLINICAL DATA:  Status post inter medullary nail and screw placement for impending right femur fracture due to metastatic disease. Multiple myeloma. EXAM: RIGHT FEMUR PORTABLE 2 VIEW; DG C-ARM 61-120 MIN COMPARISON:  CT scan of the femurs of November 17, 2017 FINDINGS: Fluoro time reported is 1 minutes, 24 seconds. Two fluoro spot images are observed. An intramedullary rod within the femoral shaft present. There are 2 telescoping  screws in the right femoral neck and head and securing screw in the lower right femoral shaft. There is no immediate postprocedure complication. IMPRESSION: No immediate postprocedure complication following intramedullary nail placement of the right femur. Electronically Signed   By: David  Martinique M.D.   On: 11/20/2017 13:41   Dg Femur Port, Min 2 Views Right  Result Date: 11/20/2017 CLINICAL DATA:  Status post inter medullary nail and screw placement for impending right femur fracture due to metastatic disease. Multiple myeloma. EXAM: RIGHT FEMUR PORTABLE 2 VIEW; DG C-ARM 61-120 MIN COMPARISON:  CT scan of the femurs of November 17, 2017 FINDINGS: Fluoro time reported is 1 minutes, 24 seconds. Two fluoro spot images are observed. An intramedullary rod within the femoral shaft present. There are 2 telescoping screws in the right femoral neck and head and securing screw in the lower right femoral shaft. There is no immediate postprocedure complication. IMPRESSION: No immediate postprocedure complication following intramedullary nail placement of the right femur. Electronically Signed   By: David  Martinique M.D.   On: 11/20/2017 13:41    ASSESSMENT & PLAN:    Component     Latest Ref Rng & Units 11/17/2017  Prostatic Specific Antigen     0.00 - 4.00 ng/mL 0.83  HIV Screen 4th Generation wRfx     Non Reactive Non Reactive  TSH     0.350 - 4.500 uIU/mL 1.874  Beta-2 Microglobulin     0.6 - 2.4 mg/L 25.7 (H)         RADIOGRAPHIC STUDIES: I have personally reviewed the radiological images as listed and agreed with the findings in the report.  ImagingResults  Dg Chest 2 View  Result Date: 11/17/2017 CLINICAL DATA:  61 year old male with chest pain. EXAM: CHEST  2 VIEW COMPARISON:  Chest radiograph dated 05/10/2015 FINDINGS: Mild right lung base atelectatic changes/ scarring. There is no focal consolidation, pleural effusion, or pneumothorax. The cardiac silhouette is within normal limits.  There is mild degenerative changes of the spine. No acute osseous pathology. IMPRESSION: No active cardiopulmonary disease. Electronically Signed   By: Anner Crete M.D.   On: 11/17/2017 07:08   Ct Head Wo Contrast  Result Date: 11/17/2017 CLINICAL DATA:  61 year old male with numbness and tingling. Paraplegia. EXAM: CT HEAD WITHOUT CONTRAST TECHNIQUE: Contiguous axial images were obtained from  the base of the skull through the vertex without intravenous contrast. COMPARISON:  Facial bone CT dated 03/27/2010 FINDINGS: Brain: The ventricles and sulci appropriate size for patient's age. Minimal periventricular and deep white matter chronic microvascular ischemic changes noted. There is no acute intracranial hemorrhage. No mass effect or midline shift noted. No extra-axial fluid collection. Vascular: There is slight high attenuation of the right MCA in the sylvian fissure (series 7 image 12). This may be chronic. If there is clinical concern for right MCA territory ischemia further evaluation with CT angiography of the head or MRI is recommended. Skull: There are innumerable lucent calvarial lesions concerning for multiple myeloma or metastatic disease. Correlation with history of known malignancy recommended. No acute fracture. Sinuses/Orbits: There is complete opacification of the visualized right maxillary sinus. Mild mucoperiosteal thickening of paranasal sinuses. There is a lytic lesion involving the posterior right mastoid versus effusion. Other: None IMPRESSION: 1. No acute intracranial hemorrhage. 2. Slightly high attenuation of the right MCA in the right sylvian fissure which may be chronic. Further evaluation with CTA or MRI is recommended if there is high clinical concern for acute right MCA territory ischemia. 3. 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 Correlation with history of malignancy recommended. Electronically Signed   By: Anner Crete M.D.   On: 11/17/2017 06:05    US Renal  Result Date: 11/17/2017 CLINICAL DATA:  Acute renal injury EXAM: RENAL / URINARY TRACT ULTRASOUND COMPLETE COMPARISON:  None. FINDINGS: Right Kidney: Length: 10.3 cm. Increased cortical echogenicity. No hydronephrosis. Than renal cortex. Left Kidney: Length: 10.2 cm. Increased cortical echogenicity. No hydronephrosis. Thinned renal cortex. Bladder: Appears normal for degree of bladder distention. IMPRESSION: 1. No hydronephrosis. 2. Increased cortical echogenicity and thinned renal cortex suggesting medical renal disease . Electronically Signed   By: Suzy Bouchard M.D.   On: 11/17/2017 08:32   Ct Femur Left Wo Contrast  Result Date: 11/18/2017 CLINICAL DATA:  Bilateral upper leg pain, worse on the right for 2 weeks. Diffuse weakness. Lytic lesions in the femurs on osseous survey. EXAM: CT OF THE LOWER LEFT EXTREMITY WITHOUT CONTRAST TECHNIQUE: Multidetector CT imaging of the lower left extremity was performed according to the standard protocol. COMPARISON:  Osseous survey 11/17/2017. FINDINGS: Bones/Joint/Cartilage No fracture is identified on the right or left. Multiple foci of endosteal scalloping are seen in the femurs bilaterally consistent with lytic lesions on plain films. Tumor burden appears fairly symmetric on the right and left. Lytic lesions are also identified in the right and left acetabuli. On the left, cortical disruption is seen adjacent to the femoral head in the anterior wall measuring 1.4 cm AP, extending 0.9 cm into bone medially and measuring 2.2 cm craniocaudal. The inner cortical wall is not disrupted. On the right, lysis extends across the medial wall of the acetabulum where there is cortical destruction adjacent to the femoral head measuring 3.5 cm AP AP by 2.0 cm craniocaudal. The inner cortical wall is not disrupted. Multiple foci of fatty marrow replacement are identified in both femurs consistent with tumor deposits. Ligaments Suboptimally assessed by CT.  Muscles and Tendons Intact and normal appearance. Soft tissues No acute abnormality. IMPRESSION: Negative for fracture. Multiple lytic lesions are present throughout both femurs with an appearance most consistent with multiple myeloma. Lytic lesions are also seen in the walls of the acetabuli bilaterally most consistent with multiple myeloma. As described above, the inner cortical wall of both the right and left acetabulum is preserved with destruction of the outer  cortical wall identified. Electronically Signed   By: Inge Rise M.D.   On: 11/18/2017 10:24   Ct Femur Right Wo Contrast  Result Date: 11/18/2017 CLINICAL DATA:  Bilateral upper leg pain, worse on the right for 2 weeks. Diffuse weakness. Lytic lesions in the femurs on osseous survey. EXAM: CT OF THE LOWER LEFT EXTREMITY WITHOUT CONTRAST TECHNIQUE: Multidetector CT imaging of the lower left extremity was performed according to the standard protocol. COMPARISON:  Osseous survey 11/17/2017. FINDINGS: Bones/Joint/Cartilage No fracture is identified on the right or left. Multiple foci of endosteal scalloping are seen in the femurs bilaterally consistent with lytic lesions on plain films. Tumor burden appears fairly symmetric on the right and left. Lytic lesions are also identified in the right and left acetabuli. On the left, cortical disruption is seen adjacent to the femoral head in the anterior wall measuring 1.4 cm AP, extending 0.9 cm into bone medially and measuring 2.2 cm craniocaudal. The inner cortical wall is not disrupted. On the right, lysis extends across the medial wall of the acetabulum where there is cortical destruction adjacent to the femoral head measuring 3.5 cm AP AP by 2.0 cm craniocaudal. The inner cortical wall is not disrupted. Multiple foci of fatty marrow replacement are identified in both femurs consistent with tumor deposits. Ligaments Suboptimally assessed by CT. Muscles and Tendons Intact and normal appearance. Soft  tissues No acute abnormality. IMPRESSION: Negative for fracture. Multiple lytic lesions are present throughout both femurs with an appearance most consistent with multiple myeloma. Lytic lesions are also seen in the walls of the acetabuli bilaterally most consistent with multiple myeloma. As described above, the inner cortical wall of both the right and left acetabulum is preserved with destruction of the outer cortical wall identified. Electronically Signed   By: Inge Rise M.D.   On: 11/18/2017 10:24   Dg Bone Survey Met  Result Date: 11/17/2017 CLINICAL DATA:  He is complaining of numbness in his distal arms and legs which has been present for the last 2 weeks. It is been generally getting worse. He also has noted a painful knot in the right rib cage. He denies fever, chills, sweats.*comment was truncated* in a have a EXAM: METASTATIC BONE SURVEY COMPARISON:  Head -CT 11/17/2017 FINDINGS: Skull: Multiple lytic lesions throughout the calvarium. Lytic lesions scalloped the cortex of the LEFT and RIGHT humeri and clavicles. Lytic lesions in the radius and ulna of the forearms Lytic lesions scalloping enter cortex of the LEFT RIGHT femur. Lytic lesions within the fibulae. IMPRESSION: 1. Lytic lesions scalloping the inner cortex of the axillary and appendicular skeleton including the skull, clavicles, humeri, femurs and distal long bones. 2. Findings most suggestive multiple myeloma. Electronically Signed   By: Suzy Bouchard M.D.   On: 11/17/2017 09:50   Ct Bone Marrow Biopsy & Aspiration  Result Date: 11/20/2017 INDICATION: 61 year old male with a history of multiple myeloma EXAM: CT BONE MARROW BIOPSY AND ASPIRATION MEDICATIONS: None. ANESTHESIA/SEDATION: Moderate (conscious) sedation was employed during this procedure. A total of Versed 2.0 mg and Fentanyl 2 Hunter mcg was administered intravenously. Moderate Sedation Time: 11 minutes. The patient's level of consciousness and vital signs were  monitored continuously by radiology nursing throughout the procedure under my direct supervision. FLUOROSCOPY TIME:  CT COMPLICATIONS: None PROCEDURE: The procedure risks, benefits, and alternatives were explained to the patient. Questions regarding the procedure were encouraged and answered. The patient understands and consents to the procedure. Scout CT of the pelvis was performed for surgical  planning purposes. The posterior pelvis was prepped with chlorhexidinein a sterile fashion, and a sterile drape was applied covering the operative field. A sterile gown and sterile gloves were used for the procedure. Local anesthesia was provided with 1% Lidocaine. We targeted the left posterior iliac bone for biopsy. The skin and subcutaneous tissues were infiltrated with 1% lidocaine without epinephrine. A small stab incision was made with an 11 blade scalpel, and an 11 gauge Murphy needle was advanced with CT guidance to the posterior cortex. Manual forced was used to advance the needle through the posterior cortex and the stylet was removed. A bone marrow aspirate was retrieved and passed to a cytotechnologist in the room. The Murphy needle was then advanced without the stylet for a core biopsy. The core biopsy was retrieved and also passed to a cytotechnologist. Manual pressure was used for hemostasis and a sterile dressing was placed. No complications were encountered no significant blood loss was encountered. Patient tolerated the procedure well and remained hemodynamically stable throughout. IMPRESSION: Status post CT-guided bone marrow biopsy, with tissue specimen sent to pathology for complete histopathologic analysis Signed, Dulcy Fanny. Earleen Newport, DO [facility] Radiology Electronically Signed   By: Corrie Mckusick D.O.   On: 11/20/2017 10:06   Dg C-arm 1-60 Min  Result Date: 11/20/2017 CLINICAL DATA:  Status post inter medullary nail and screw placement for  impending right femur fracture due to metastatic disease. Multiple myeloma. EXAM: RIGHT FEMUR PORTABLE 2 VIEW; DG C-ARM 61-120 MIN COMPARISON:  CT scan of the femurs of November 17, 2017 FINDINGS: Fluoro time reported is 1 minutes, 24 seconds. Two fluoro spot images are observed. An intramedullary rod within the femoral shaft present. There are 2 telescoping screws in the right femoral neck and head and securing screw in the lower right femoral shaft. There is no immediate postprocedure complication. IMPRESSION: No immediate postprocedure complication following intramedullary nail placement of the right femur. Electronically Signed   By: David  Martinique M.D.   On: 11/20/2017 13:41   Dg Femur Port, Min 2 Views Right  Result Date: 11/20/2017 CLINICAL DATA:  Status post inter medullary nail and screw placement for impending right femur fracture due to metastatic disease. Multiple myeloma. EXAM: RIGHT FEMUR PORTABLE 2 VIEW; DG C-ARM 61-120 MIN COMPARISON:  CT scan of the femurs of November 17, 2017 FINDINGS: Fluoro time reported is 1 minutes, 24 seconds. Two fluoro spot images are observed. An intramedullary rod within the femoral shaft present. There are 2 telescoping screws in the right femoral neck and head and securing screw in the lower right femoral shaft. There is no immediate postprocedure complication. IMPRESSION: No immediate postprocedure complication following intramedullary nail placement of the right femur. Electronically Signed   By: David  Martinique M.D.   On: 11/20/2017 13:41     ASSESSMENT & PLAN:   61 y.o. AA male presenting with severe hypercalcemia, skeletal findings consistent with myeloma and significant leukocytosis/lymphocytosis in the peripheral bloodas well as AKI and hypercalcemia.  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  Prelim- BM Bx showed 90-95% involvement with kappa restricted serum free light chains. 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  Lymphocytosis/leukocytosis somewhat improved with high-dose steroids.  2) s/p Prophylactic IM nailing for rt femur.  3) high risk of tumor lysis syndrome with initial treatment.  4) h/o tobacco abuse  5) h/o cocaine and ETOH abuse -- sober for 3 yrs  6) AKI vs ARF on CKD -- due to myeloma.    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 Hemoglobin down to 7 We will transfuse 2 units of PRBCs today orders placed. informed consent obtained from patient.  8) Leucocytosis/Lymphocytosis -primarily from plasma cell leukemia.  Somewhat improved overnight with high-dose dexamethasone.  9) TLS -Prophylaxis with allopurinol 100 mg daily renally adjusted dose -Monitor uric acid daily along with electrolytes including potassium.  10) Hyperkalemia due to some TLS + renal insufficiency  PLAN -2 units of PRBCs today for symptomatic anemia and hemoglobin of 7. -Hyperkalemia potassium 5.9 likely due to some element of tumor lysis in addition to renal insufficiency.  Patient has received Kayexalate.  Will need to repeat chemistries later in the day today to monitor potassium. -If significant persistent hyperkalemia might need to have nephrology involved especially if his bicarb levels are dropping since he might need some attention to IV fluid management and bicarb support or consideration of renal replacement therapy if these become limiting. -We will received first dose of Velcade subcutaneously today. -We will add cyclophosphamide in 1-2 days if no massive tumor lysis noted. -Additional dose of dexamethasone 20 mg this morning. -renally adjusted allopurinol and as needed rasburicase for management of possible tumor lysis syndrome -Daily CBC with differential and CMP -Currently calcium levels are okay and there is  no indication for additional bisphosphonate immediately at this time -We will need bisphosphonates every 4 weeks or for treatment of hypercalcemia.  Will prefer pamidronate given his renal insufficiency.     -will f/u on final BM Bx results and Cytogenetics. (Discussed Prelim results with Dr Melina Copa -pathology)  6) multiple myeloma related skeletal pain -Oxycodone 5-10 mg every 6 hours as needed -Senna S for bowel prophylaxis  I shall continue to follow daily. Appreciate excellent hospital medicine cares.   I spent 30 minutes counseling the patient face to face. The total time spent in the appointment was 40 minutes and more than 50% was on counseling and direct patient cares.    Sullivan Lone MD [place] AAHIVMS [facility] Hematology/Oncology Physician [facility]  (Office):       [phone] (Work cell):  [phone] (Fax):           [phone]  11/24/2017 11:25 AM

## 2017-11-24 NOTE — Progress Notes (Signed)
PT Cancellation Note  Patient Details Name: Andres Fernandez MRN: [id number] DOB: June 02, 1956   Cancelled Treatment:    Reason Eval/Treat Not Completed: Medical issues which prohibited therapy. To get blood. Will check back another time.   [name] 11/24/2017, 11:49 AM  [name] PT [phone]

## 2017-11-25 ENCOUNTER — Inpatient Hospital Stay (HOSPITAL_COMMUNITY): Payer: BLUE CROSS/BLUE SHIELD

## 2017-11-25 DIAGNOSIS — I959 Hypotension, unspecified: Secondary | ICD-10-CM

## 2017-11-25 DIAGNOSIS — R509 Fever, unspecified: Secondary | ICD-10-CM

## 2017-11-25 DIAGNOSIS — N179 Acute kidney failure, unspecified: Secondary | ICD-10-CM

## 2017-11-25 DIAGNOSIS — G893 Neoplasm related pain (acute) (chronic): Secondary | ICD-10-CM

## 2017-11-25 LAB — URINALYSIS, ROUTINE W REFLEX MICROSCOPIC
BILIRUBIN URINE: NEGATIVE
GLUCOSE, UA: NEGATIVE mg/dL
KETONES UR: NEGATIVE mg/dL
LEUKOCYTES UA: NEGATIVE
NITRITE: NEGATIVE
PH: 5 (ref 5.0–8.0)
PROTEIN: NEGATIVE mg/dL
RBC / HPF: NONE SEEN RBC/hpf (ref 0–5)
SQUAMOUS EPITHELIAL / LPF: NONE SEEN
Specific Gravity, Urine: 1.006 (ref 1.005–1.030)

## 2017-11-25 LAB — CBC WITH DIFFERENTIAL/PLATELET
BAND NEUTROPHILS: 1 %
BLASTS: 0 %
Basophils Absolute: 0 10*3/uL (ref 0.0–0.1)
Basophils Relative: 0 %
EOS ABS: 0.3 10*3/uL (ref 0.0–0.7)
Eosinophils Relative: 2 %
HEMATOCRIT: 23.3 % — AB (ref 39.0–52.0)
HEMOGLOBIN: 8.1 g/dL — AB (ref 13.0–17.0)
LYMPHS PCT: 32 %
Lymphs Abs: 4.9 10*3/uL — ABNORMAL HIGH (ref 0.7–4.0)
MCH: 30.1 pg (ref 26.0–34.0)
MCHC: 34.8 g/dL (ref 30.0–36.0)
MCV: 86.6 fL (ref 78.0–100.0)
MONO ABS: 0.3 10*3/uL (ref 0.1–1.0)
MYELOCYTES: 1 %
Metamyelocytes Relative: 2 %
Monocytes Relative: 2 %
NEUTROS PCT: 60 %
NRBC: 0 /100{WBCs}
Neutro Abs: 9.9 10*3/uL — ABNORMAL HIGH (ref 1.7–7.7)
OTHER: 0 %
PROMYELOCYTES ABS: 0 %
Platelets: 148 10*3/uL — ABNORMAL LOW (ref 150–400)
RBC: 2.69 MIL/uL — ABNORMAL LOW (ref 4.22–5.81)
RDW: 15.7 % — ABNORMAL HIGH (ref 11.5–15.5)
WBC: 15.4 10*3/uL — ABNORMAL HIGH (ref 4.0–10.5)

## 2017-11-25 LAB — URIC ACID: Uric Acid, Serum: 6.5 mg/dL (ref 4.4–7.6)

## 2017-11-25 LAB — COMPREHENSIVE METABOLIC PANEL
ALBUMIN: 2.7 g/dL — AB (ref 3.5–5.0)
ALK PHOS: 56 U/L (ref 38–126)
ALT: 27 U/L (ref 17–63)
ANION GAP: 9 (ref 5–15)
AST: 31 U/L (ref 15–41)
BILIRUBIN TOTAL: 0.5 mg/dL (ref 0.3–1.2)
BUN: 49 mg/dL — AB (ref 6–20)
CALCIUM: 7.4 mg/dL — AB (ref 8.9–10.3)
CO2: 17 mmol/L — AB (ref 22–32)
CREATININE: 3.8 mg/dL — AB (ref 0.61–1.24)
Chloride: 113 mmol/L — ABNORMAL HIGH (ref 101–111)
GFR calc Af Amer: 18 mL/min — ABNORMAL LOW (ref >60)
GFR calc non Af Amer: 16 mL/min — ABNORMAL LOW (ref >60)
GLUCOSE: 117 mg/dL — AB (ref 65–99)
Potassium: 4.5 mmol/L (ref 3.5–5.1)
SODIUM: 139 mmol/L (ref 135–145)
TOTAL PROTEIN: 5.6 g/dL — AB (ref 6.5–8.1)

## 2017-11-25 LAB — LACTIC ACID, PLASMA
LACTIC ACID, VENOUS: 2 mmol/L — AB (ref 0.5–1.9)
LACTIC ACID, VENOUS: 2.2 mmol/L — AB (ref 0.5–1.9)

## 2017-11-25 LAB — MRSA PCR SCREENING: MRSA by PCR: NEGATIVE

## 2017-11-25 LAB — PROCALCITONIN: Procalcitonin: 3.42 ng/mL

## 2017-11-25 MED ORDER — SODIUM CHLORIDE 0.9 % IV BOLUS (SEPSIS)
500.0000 mL | Freq: Once | INTRAVENOUS | Status: AC
  Administered 2017-11-25: 500 mL via INTRAVENOUS

## 2017-11-25 MED ORDER — VANCOMYCIN HCL IN DEXTROSE 1-5 GM/200ML-% IV SOLN: 1000.0000 mg | INTRAVENOUS | Status: DC

## 2017-11-25 MED ORDER — IOPAMIDOL (ISOVUE-300) INJECTION 61%: 30.0000 mL | Freq: Once | INTRAVENOUS | Status: DC | PRN

## 2017-11-25 MED ORDER — CEFEPIME HCL 2 G IJ SOLR
2.0000 g | INTRAMUSCULAR | Status: DC
  Administered 2017-11-25 – 2017-11-27 (×3): 2 g via INTRAVENOUS
  Filled 2017-11-25 (×4): qty 2

## 2017-11-25 MED ORDER — SODIUM CHLORIDE 0.9 % IV SOLN
1500.0000 mg | Freq: Once | INTRAVENOUS | Status: AC
  Administered 2017-11-25: 1500 mg via INTRAVENOUS
  Filled 2017-11-25: qty 1500

## 2017-11-25 MED ORDER — SODIUM BICARBONATE 8.4 % IV SOLN
INTRAVENOUS | Status: DC
  Administered 2017-11-25 – 2017-11-28 (×6): via INTRAVENOUS
  Filled 2017-11-25 (×5): qty 100

## 2017-11-25 MED ORDER — LORAZEPAM 1 MG PO TABS
1.0000 mg | ORAL_TABLET | Freq: Once | ORAL | Status: AC
  Administered 2017-11-25: 1 mg via ORAL
  Filled 2017-11-25: qty 1

## 2017-11-25 MED ORDER — ACETAMINOPHEN 325 MG PO TABS
650.0000 mg | ORAL_TABLET | Freq: Once | ORAL | Status: AC
  Administered 2017-11-25: 650 mg via ORAL
  Filled 2017-11-25: qty 2

## 2017-11-25 MED ORDER — MORPHINE SULFATE (PF) 2 MG/ML IV SOLN
1.0000 mg | INTRAVENOUS | Status: DC | PRN
  Administered 2017-11-25: 1 mg via INTRAVENOUS
  Filled 2017-11-25: qty 1

## 2017-11-25 MED ORDER — MORPHINE SULFATE (PF) 4 MG/ML IV SOLN
1.0000 mg | INTRAVENOUS | Status: DC | PRN
  Administered 2017-11-25: 1 mg via INTRAVENOUS
  Filled 2017-11-25: qty 1

## 2017-11-25 MED ORDER — ACETAMINOPHEN 325 MG PO TABS
650.0000 mg | ORAL_TABLET | ORAL | Status: DC | PRN
  Administered 2017-11-25: 650 mg via ORAL
  Filled 2017-11-25: qty 2

## 2017-11-25 MED ORDER — HYDROMORPHONE HCL 1 MG/ML IJ SOLN
0.4000 mg | INTRAMUSCULAR | Status: DC | PRN
  Administered 2017-11-26 – 2017-11-28 (×3): 0.4 mg via INTRAVENOUS
  Filled 2017-11-25 (×3): qty 1

## 2017-11-25 MED ORDER — OXYCODONE HCL 5 MG PO TABS
5.0000 mg | ORAL_TABLET | ORAL | Status: DC | PRN
  Administered 2017-11-25 (×2): 10 mg via ORAL
  Administered 2017-11-25: 5 mg via ORAL
  Administered 2017-11-25 – 2017-11-30 (×15): 10 mg via ORAL
  Administered 2017-12-01: 5 mg via ORAL
  Administered 2017-12-01 – 2017-12-06 (×10): 10 mg via ORAL
  Filled 2017-11-25 (×5): qty 2
  Filled 2017-11-25: qty 1
  Filled 2017-11-25 (×8): qty 2
  Filled 2017-11-25: qty 1
  Filled 2017-11-25 (×13): qty 2

## 2017-11-25 MED ORDER — IOPAMIDOL (ISOVUE-300) INJECTION 61%
INTRAVENOUS | Status: AC
  Filled 2017-11-25: qty 30

## 2017-11-25 NOTE — Progress Notes (Signed)
At [place] notified MD of patients b/p 89/43 by Dinamap & 92/40 manual temp 100.9 HR 133. New orders given for additional NS bolus. Patient transferred to Step Down unit per doctors orders at [place] after bed became available for closer monitoring.

## 2017-11-25 NOTE — Progress Notes (Signed)
PT Cancellation Note  Patient Details Name: Andres Fernandez MRN: [id number] DOB: 12-Apr-1956   Cancelled Treatment:    Reason Eval/Treat Not Completed: Medical issues which prohibited therapy , in imaging, will check back another time.  [name] 11/25/2017, 8:38 AM  [name] PT [phone]

## 2017-11-25 NOTE — Plan of Care (Signed)
  Clinical Measurements: Will remain free from infection 11/25/2017 2251 - Progressing by Mickie Kay, RN

## 2017-11-25 NOTE — Progress Notes (Signed)
PT Cancellation Note  Patient Details Name: Andres Fernandez MRN: [id number] DOB: 1956/06/20   Cancelled Treatment:    Reason Eval/Treat Not Completed: Medical issues which prohibited therapy . Transferred to SDU. Noted increased HR, fever.   [name] 11/25/2017, 1:46 PM [name] PT [phone]

## 2017-11-25 NOTE — Progress Notes (Signed)
At 0540, NT notified RN of pts temp 100.7 and HR 133. Other VS include BP 90/53, RR 24, O2 sat 96% on RA. Pt c/o chills, restlessness. Seemed slightly confused in some statements he made regarding his VS, meds, etc, but answered orientation questions appropriately. Rapid response called to assess pt due to change in VS and mentation. On call provider also called and currently in to assess pt, new orders received and implemented. Continue to monitor. Hortencia Conradi RN

## 2017-11-25 NOTE — Progress Notes (Addendum)
[facility] PROGRESS NOTE  BIRNEY BELSHE YSA:[id number] DOB: 02-10-1956 DOA: 11/23/2017  PCP: Patient, No Pcp Per  Brief History/Interval Summary: 61 year old male with recent hospitalization where he was found to have lytic lesions.  He also had an impending pathological fracture in the right femur requiring intramedullary nail placement by orthopedics.  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  Patient was hospitalized and started on chemotherapy.  Overnight on 12/15 patient developed a fever and tachycardia.  He was noted to be hypotensive.  He was transferred to stepdown unit.  Reason for Visit: Plasma cell leukemia  Consultants: Oncology  Procedures:  Blood transfusion x 2  Antibiotics: None  Subjective/Interval History: Patient complains of his pain in the right leg at the surgery site.  Also complains of bilateral rib cage pain although more on the right side.  Not a very good historian.  However he denies any dizziness lightheadedness.  Denies any chest pain or shortness of breath.    ROS: Denies any headaches.  Objective:  Vital Signs  Vitals:   11/25/17 0500 11/25/17 0543 11/25/17 0553 11/25/17 0628  BP: (!) 99/57 (!) 90/56 (!) 99/53 (!) 99/56  Pulse: (!) 133 (!) 135 (!) 131 (!) 134  Resp: 20 (!) 24 20 (!) 22  Temp: (!) 102.6 F (39.2 C) (!) 100.7 F (38.2 C) (!) 101 F (38.3 C)   TempSrc:      SpO2: 96% 98%  93%  Weight: 70.1 kg (154 lb 8.7 oz)     Height:        Intake/Output Summary (Last 24 hours) at 11/25/2017 0733 Last data filed at 11/25/2017 0441 Gross per 24 hour  Intake 1923 ml  Output 1100 ml  Net 823 ml   Filed Weights   11/23/17 1443 11/25/17 0500  Weight: 65.3 kg (144 lb) 70.1 kg (154 lb 8.7 oz)    General appearance: He is awake alert.  Somewhat distracted.  In no distress. Resp: Noted to be mildly tachypneic.  But lungs are clear to auscultation  bilaterally.  Somewhat diminished air entry at the bases. Cardio: S1-S2 is normal regular.  No S3-S4.  No rubs murmurs or bruit Chest wall: Tender to palpation over the right lower rib cage. GI: Abdomen is soft.  Tender in the right flank and upper quadrant without any rebound rigidity or guarding. Extremities: Dressing noted over the right lower extremity incision sites.  No erythema.  Some mild bruising is noted. Neurologic: Awake alert.  Distracted.  No obvious focal neurological deficits.  Lab Results:  Data Reviewed: I have personally reviewed following labs and imaging studies  CBC: Recent Labs  Lab 11/19/17 0227 11/20/17 0150 11/22/17 0405 11/23/17 1800 11/24/17 0344 11/25/17 0352  WBC 38.6* 31.7* 43.2* 55.3* 42.4* 15.4*  NEUTROABS 5.8  --  16.8* 13.3* 11.9* 9.9*  HGB 9.4* 9.5* 8.3* 8.1* 7.0* 8.1*  HCT 26.8* 27.0* 23.1* 23.0* 20.2* 23.3*  MCV 89.9 89.4 87.8 88.8 88.2 86.6  PLT 200 185 179 213 212 148*    Basic Metabolic Panel: Recent Labs  Lab 11/19/17 0227 11/20/17 0150 11/21/17 0340 11/22/17 0405 11/23/17 1800 11/24/17 0344 11/24/17 1420 11/25/17 0352  NA 135 133* 134* 138 139 136 140 139  K 4.5 4.7 5.0 5.0 4.8 5.9* 4.4 4.5  CL 105 107 109 113* 113* 111 111 113*  CO2 20* 18* 16* 16* 19* 19* 18* 17*  GLUCOSE 159* 137* 131* 119* 110* 139*  126* 117*  BUN 47* 51* 54* 54* 52* 53* 55* 49*  CREATININE 5.28* 4.31* 3.89* 3.65* 3.80* 3.87* 3.86* 3.80*  CALCIUM 9.8 8.9 8.7* 8.5* 8.3* 8.0* 7.9* 7.4*  MG 2.3  --   --   --   --   --   --   --   PHOS  --  3.5 3.7 2.8  --   --   --   --     GFR: Estimated Creatinine Clearance: 20 mL/min (A) (by C-G formula based on SCr of 3.8 mg/dL (H)).  Liver Function Tests: Recent Labs  Lab 11/19/17 0227  11/21/17 0340 11/22/17 0405 11/23/17 1800 11/24/17 0344 11/25/17 0352  AST 21  --   --   --  35 29 31  ALT 23  --   --   --  _0 ALKPHOS 61  --   --   --  58 53 56  BILITOT 0.4  --   --   --  0.5 0.6 0.5  PROT  6.4*  --   --   --  6.3* 5.9* 5.6*  ALBUMIN 3.4*   < > 3.1* 3.0* 3.2* 3.0* 2.7*   < > = values in this interval not displayed.    Coagulation Profile: Recent Labs  Lab 11/19/17 0227  INR 1.11    Anemia Panel: Recent Labs    11/23/17 1800  RETICCTPCT 1.3    Recent Results (from the past 240 hour(s))  Surgical pcr screen     Status: None   Collection Time: 11/20/17  6:15 AM  Result Value Ref Range Status   MRSA, PCR NEGATIVE NEGATIVE Final   Staphylococcus aureus NEGATIVE NEGATIVE Final    Comment: (NOTE) The Xpert SA Assay (FDA approved for NASAL specimens in patients 44 years of age and older), is one component of a comprehensive surveillance program. It is not intended to diagnose infection nor to guide or monitor treatment.       Radiology Studies: No results found.   Medications:  Scheduled: . acyclovir  200 mg Oral Daily  . allopurinol  100 mg Oral Daily  . bortezomib SQ  1.3 mg/m2 Subcutaneous Once  . dexamethasone  20 mg Oral Daily  . heparin  5,000 Units Subcutaneous Q8H  . polyethylene glycol  17 g Oral Daily  . sodium bicarbonate  650 mg Oral TID   Continuous: . sodium chloride    .  sodium bicarbonate  infusion 1000 mL    . sodium chloride    . 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  Management per oncology.  Patient received bortezomib.  Also on dexamethasone.   Patient is at high risk for tumor lysis syndrome.  He is on allopurinol.  Uric acid levels are normal.  Fever/hypotension/sinus tachycardia Patient developed fever overnight.  Could this be due to tumor and chemotherapy?  Tachycardia is most likely due to the fever.  He has mentioning pain in his  right lower chest and upper abdomen and flank.  No recent imaging studies noted.  We will proceed with CT scan chest and abdomen pelvis.  Lactic acid 2.0.  Blood pressures were low and improved with IV hydration.  Blood cultures have been sent.  Will also order pro-calcitonin.  Discussed with Dr. Irene Limbo with oncology who agrees with this plan.  He will feels that fever could be due to tumor and the chemotherapeutic agents.  Hold off on antibiotics for now. His WBC actually is lower today compared to yesterday.  ADDENDUM: Pro-calcitonin noted to be greater than 3.  Lactic acid remains elevated.  Will check UA urine cultures.  Initiate broad-spectrum antibiotics for now.  Await results of CT scan.  Acute renal failure versus acute renal failure on chronic kidney disease/Hyperkalemia/metabolic acidosis Most likely due to the above process.  Patient was seen by nephrology during his recent hospitalization.  Patient creatinine is stable.  Continue to monitor urine output.  He was started on sodium bicarbonate tablets yesterday.  Switched over to bicarbonate infusion today.  Hyperkalemia has resolved.  Continue to monitor labs closely.    Recent hypercalcemia The patient was given bisphosphonate during recent hospitalization.  Calcium level has improved.  Continue to monitor closely.  Recent right femoral intramedullary nail placement This was for a lytic lesion with impending pathological fracture.  Continue pain medications.  PT and OT evaluation.  Newly diagnosed essential hypertension Patient was started on low-dose Norvasc.  Blood pressure noted to be low this morning as discussed above.  Continue to hold  his antihypertensives.    Normocytic anemia Most likely secondary to malignancy.  He also was noted to have significant leukocytosis which is also due to his cancer.    Platelet counts have been normal.  Noted to be slightly low today.  Hemoglobin improved after blood transfusion.  History of tobacco abuse Patient has not smoked since he was last hospitalized.  Continue to emphasize smoking cessation.  Constipation Most likely secondary to pain medications.  He was started on MiraLAX.  Did have a bowel movement.  DVT Prophylaxis: Subcutaneous heparin    Code Status: Full code Family Communication: Discussed with the patient and his sister over the phone Disposition Plan: Management as outlined above.    LOS: 2 days   [facility] Pager [phone] 11/25/2017, 7:33 AM  If 7PM-7AM, please contact night-coverage at [email or web address], password [facility]

## 2017-11-25 NOTE — Progress Notes (Signed)
CRITICAL VALUE ALERT  Critical Value:  Lactic acid 2.0 Date & Time Notied:  11/25/17 0900  Provider Notified: Dr Maryland Pink  Orders Received/Actions taken: No new orders

## 2017-11-25 NOTE — Progress Notes (Signed)
Andres Fernandez   HEMATOLOGY/ONCOLOGY INPATIENT PROGRESS NOTE  Date of Service: 11/25/2017  Inpatient Attending: .Bonnielee Haff, MD   SUBJECTIVE  Patient was seen this afternoon. He tolerated the PRBC transfusion well yesterday. Noted to have fever of 102.2F this AM with tachycardia and so transferred to Step Down and received additional IVF. Hyperkalemia resolved. Fever ? Velcade vs tumor lysis, ruling out infection. Does note new rt flank/lower back pain starting last night.  CT chest/abd/pelvis done -results pending NO rash, SOB, CP , cough, dysuria or diarrhea.   OBJECTIVE:  NAD  PHYSICAL EXAMINATION: . Vitals:   11/25/17 0742 11/25/17 0859 11/25/17 0941 11/25/17 1108  BP: (!) 92/40 (!) 100/52 (!) 107/51 (!) 100/52  Pulse:  (!) 129 (!) 128 (!) 125  Resp:   (!) 30 (!) 30  Temp: (!) 100.9 F (38.3 C)  99.8 F (37.7 C)   TempSrc: Oral  Oral   SpO2:   97% 94%  Weight:      Height:       Filed Weights   11/23/17 1443 11/25/17 0500  Weight: 144 lb (65.3 kg) 154 lb 8.7 oz (70.1 kg)   .Body mass index is 23.5 kg/m. GENERAL:alert, in no acute distress and comfortable SKIN: no acute rashes, no significant lesions EYES: conjunctiva are pink and non-injected, sclera anicteric OROPHARYNX: MMM, no exudates, no oropharyngeal erythema or ulceration NECK: supple, no JVD LYMPH:  no palpable lymphadenopathy in the cervical, axillary or inguinal regions LUNGS: clear to auscultation b/l with normal respiratory effort HEART: regular rate & rhythm ABDOMEN:  normoactive bowel sounds , non tender, not distended. Extremity: no pedal edema PSYCH: alert & oriented x 3 with fluent speech NEURO: no focal motor/sensory deficits   MEDICAL HISTORY:  Past Medical History:  Diagnosis Date  . BPH (benign prostatic hyperplasia)   . Hypertension   . Tobacco abuse     SURGICAL HISTORY: Past Surgical History:  Procedure Laterality Date  . APPENDECTOMY    . FEMUR IM NAIL Right 11/20/2017   Procedure: RIGHT FEMORAL INTRAMEDULLARY (IM) NAIL;  Surgeon: Nicholes Stairs, MD;  Location: [facility];  Service: Orthopedics;  Laterality: Right;    SOCIAL HISTORY: Social History   Socioeconomic History  . Marital status: Single    Spouse name: Not on file  . Number of children: Not on file  . Years of education: Not on file  . Highest education level: Not on file  Social Needs  . Financial resource strain: Not on file  . Food insecurity - worry: Not on file  . Food insecurity - inability: Not on file  . Transportation needs - medical: Not on file  . Transportation needs - non-medical: Not on file  Occupational History  . Not on file  Tobacco Use  . Smoking status: Former Smoker    Packs/day: 0.50    Types: Cigarettes    Last attempt to quit: 11/09/2017    Years since quitting: 0.0  . Smokeless tobacco: Never Used  . Tobacco comment: no cigarettes x 1 week.  Substance and Sexual Activity  . Alcohol use: Yes  . Drug use: No  . Sexual activity: Not on file  Other Topics Concern  . Not on file  Social History Narrative  . Not on file    FAMILY HISTORY: Family History  Problem Relation Age of Onset  . COPD Mother   . Liver cancer Mother     ALLERGIES:  has No Known Allergies.  MEDICATIONS:  Scheduled Meds: . acyclovir  200 mg Oral Daily  . allopurinol  100 mg Oral Daily  . bortezomib SQ  1.3 mg/m2 Subcutaneous Once  . dexamethasone  20 mg Oral Daily  . heparin  5,000 Units Subcutaneous Q8H  . iopamidol      . polyethylene glycol  17 g Oral Daily  . sodium bicarbonate  650 mg Oral TID   Continuous Infusions: . sodium chloride    .  sodium bicarbonate  infusion 1000 mL 75 mL/hr at 11/25/17 0852  . sodium chloride     PRN Meds:.acetaminophen, heparin lock flush, heparin lock flush, iopamidol, morphine injection, ondansetron (ZOFRAN) IV, oxyCODONE, sodium chloride flush, sodium chloride flush  REVIEW OF SYSTEMS:    10 Point review of Systems was done is  negative except as noted above.   LABORATORY DATA:  I have reviewed the data as listed  . CBC Latest Ref Rng & Units 11/25/2017 11/24/2017 11/23/2017  WBC 4.0 - 10.5 K/uL 15.4(H) 42.4(H) 55.3(HH)  Hemoglobin 13.0 - 17.0 g/dL 8.1(L) 7.0(L) 8.1(L)  Hematocrit 39.0 - 52.0 % 23.3(L) 20.2(L) 23.0(L)  Platelets 150 - 400 K/uL 148(L) 212 213   . CBC    Component Value Date/Time   WBC 15.4 (H) 11/25/2017 0352   RBC 2.69 (L) 11/25/2017 0352   HGB 8.1 (L) 11/25/2017 0352   HCT 23.3 (L) 11/25/2017 0352   PLT 148 (L) 11/25/2017 0352   MCV 86.6 11/25/2017 0352   MCH 30.1 11/25/2017 0352   MCHC 34.8 11/25/2017 0352   RDW 15.7 (H) 11/25/2017 0352   LYMPHSABS 4.9 (H) 11/25/2017 0352   MONOABS 0.3 11/25/2017 0352   EOSABS 0.3 11/25/2017 0352   BASOSABS 0.0 11/25/2017 0352     . CMP Latest Ref Rng & Units 11/25/2017 11/24/2017 11/24/2017  Glucose 65 - 99 mg/dL 117(H) 126(H) 139(H)  BUN 6 - 20 mg/dL 49(H) 55(H) 53(H)  Creatinine 0.61 - 1.24 mg/dL 3.80(H) 3.86(H) 3.87(H)  Sodium 135 - 145 mmol/L 139 140 136  Potassium 3.5 - 5.1 mmol/L 4.5 4.4 5.9(H)  Chloride 101 - 111 mmol/L 113(H) 111 111  CO2 22 - 32 mmol/L 17(L) 18(L) 19(L)  Calcium 8.9 - 10.3 mg/dL 7.4(L) 7.9(L) 8.0(L)  Total Protein 6.5 - 8.1 g/dL 5.6(L) - 5.9(L)  Total Bilirubin 0.3 - 1.2 mg/dL 0.5 - 0.6  Alkaline Phos 38 - 126 U/L 56 - 53  AST 15 - 41 U/L 31 - 29  ALT 17 - 63 U/L 27 - 25    Component     Latest Ref Rng & Units 11/17/2017  Prostatic Specific Antigen     0.00 - 4.00 ng/mL 0.83  HIV Screen 4th Generation wRfx     Non Reactive Non Reactive  TSH     0.350 - 4.500 uIU/mL 1.874  Beta-2 Microglobulin     0.6 - 2.4 mg/L 25.7 (H)          RADIOGRAPHIC STUDIES: I have personally reviewed the radiological images as listed and agreed with the findings in the report. Dg Chest 2 View  Result Date: 11/17/2017 CLINICAL DATA:  61 year old male with chest pain. EXAM: CHEST  2 VIEW COMPARISON:  Chest  radiograph dated 05/10/2015 FINDINGS: Mild right lung base atelectatic changes/ scarring. There is no focal consolidation, pleural effusion, or pneumothorax. The cardiac silhouette is within normal limits. There is mild degenerative changes of the spine. No acute osseous pathology. IMPRESSION: No active cardiopulmonary disease. Electronically Signed   By: Anner Crete M.D.   On: 11/17/2017 07:08  Ct Head Wo Contrast  Result Date: 11/17/2017 CLINICAL DATA:  61 year old male with numbness and tingling. Paraplegia. EXAM: CT HEAD WITHOUT CONTRAST TECHNIQUE: Contiguous axial images were obtained from the base of the skull through the vertex without intravenous contrast. COMPARISON:  Facial bone CT dated 03/27/2010 FINDINGS: Brain: The ventricles and sulci appropriate size for patient's age. Minimal periventricular and deep white matter chronic microvascular ischemic changes noted. There is no acute intracranial hemorrhage. No mass effect or midline shift noted. No extra-axial fluid collection. Vascular: There is slight high attenuation of the right MCA in the sylvian fissure (series 7 image 12). This may be chronic. If there is clinical concern for right MCA territory ischemia further evaluation with CT angiography of the head or MRI is recommended. Skull: There are innumerable lucent calvarial lesions concerning for multiple myeloma or metastatic disease. Correlation with history of known malignancy recommended. No acute fracture. Sinuses/Orbits: There is complete opacification of the visualized right maxillary sinus. Mild mucoperiosteal thickening of paranasal sinuses. There is a lytic lesion involving the posterior right mastoid versus effusion. Other: None IMPRESSION: 1. No acute intracranial hemorrhage. 2. Slightly high attenuation of the right MCA in the right sylvian fissure which may be chronic. Further evaluation with CTA or MRI is recommended if there is high clinical concern for acute right MCA  territory ischemia. 3. 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 Correlation with history of malignancy recommended. Electronically Signed   By: Anner Crete M.D.   On: 11/17/2017 06:05   US Renal  Result Date: 11/17/2017 CLINICAL DATA:  Acute renal injury EXAM: RENAL / URINARY TRACT ULTRASOUND COMPLETE COMPARISON:  None. FINDINGS: Right Kidney: Length: 10.3 cm. Increased cortical echogenicity. No hydronephrosis. Than renal cortex. Left Kidney: Length: 10.2 cm. Increased cortical echogenicity. No hydronephrosis. Thinned renal cortex. Bladder: Appears normal for degree of bladder distention. IMPRESSION: 1. No hydronephrosis. 2. Increased cortical echogenicity and thinned renal cortex suggesting medical renal disease . Electronically Signed   By: Suzy Bouchard M.D.   On: 11/17/2017 08:32   Ct Femur Left Wo Contrast  Result Date: 11/18/2017 CLINICAL DATA:  Bilateral upper leg pain, worse on the right for 2 weeks. Diffuse weakness. Lytic lesions in the femurs on osseous survey. EXAM: CT OF THE LOWER LEFT EXTREMITY WITHOUT CONTRAST TECHNIQUE: Multidetector CT imaging of the lower left extremity was performed according to the standard protocol. COMPARISON:  Osseous survey 11/17/2017. FINDINGS: Bones/Joint/Cartilage No fracture is identified on the right or left. Multiple foci of endosteal scalloping are seen in the femurs bilaterally consistent with lytic lesions on plain films. Tumor burden appears fairly symmetric on the right and left. Lytic lesions are also identified in the right and left acetabuli. On the left, cortical disruption is seen adjacent to the femoral head in the anterior wall measuring 1.4 cm AP, extending 0.9 cm into bone medially and measuring 2.2 cm craniocaudal. The inner cortical wall is not disrupted. On the right, lysis extends across the medial wall of the acetabulum where there is cortical destruction adjacent to the femoral head measuring 3.5 cm AP AP by 2.0 cm  craniocaudal. The inner cortical wall is not disrupted. Multiple foci of fatty marrow replacement are identified in both femurs consistent with tumor deposits. Ligaments Suboptimally assessed by CT. Muscles and Tendons Intact and normal appearance. Soft tissues No acute abnormality. IMPRESSION: Negative for fracture. Multiple lytic lesions are present throughout both femurs with an appearance most consistent with multiple myeloma. Lytic lesions are also seen in the walls  of the acetabuli bilaterally most consistent with multiple myeloma. As described above, the inner cortical wall of both the right and left acetabulum is preserved with destruction of the outer cortical wall identified. Electronically Signed   By: Inge Rise M.D.   On: 11/18/2017 10:24   Ct Femur Right Wo Contrast  Result Date: 11/18/2017 CLINICAL DATA:  Bilateral upper leg pain, worse on the right for 2 weeks. Diffuse weakness. Lytic lesions in the femurs on osseous survey. EXAM: CT OF THE LOWER LEFT EXTREMITY WITHOUT CONTRAST TECHNIQUE: Multidetector CT imaging of the lower left extremity was performed according to the standard protocol. COMPARISON:  Osseous survey 11/17/2017. FINDINGS: Bones/Joint/Cartilage No fracture is identified on the right or left. Multiple foci of endosteal scalloping are seen in the femurs bilaterally consistent with lytic lesions on plain films. Tumor burden appears fairly symmetric on the right and left. Lytic lesions are also identified in the right and left acetabuli. On the left, cortical disruption is seen adjacent to the femoral head in the anterior wall measuring 1.4 cm AP, extending 0.9 cm into bone medially and measuring 2.2 cm craniocaudal. The inner cortical wall is not disrupted. On the right, lysis extends across the medial wall of the acetabulum where there is cortical destruction adjacent to the femoral head measuring 3.5 cm AP AP by 2.0 cm craniocaudal. The inner cortical wall is not disrupted.  Multiple foci of fatty marrow replacement are identified in both femurs consistent with tumor deposits. Ligaments Suboptimally assessed by CT. Muscles and Tendons Intact and normal appearance. Soft tissues No acute abnormality. IMPRESSION: Negative for fracture. Multiple lytic lesions are present throughout both femurs with an appearance most consistent with multiple myeloma. Lytic lesions are also seen in the walls of the acetabuli bilaterally most consistent with multiple myeloma. As described above, the inner cortical wall of both the right and left acetabulum is preserved with destruction of the outer cortical wall identified. Electronically Signed   By: Inge Rise M.D.   On: 11/18/2017 10:24   Dg Bone Survey Met  Result Date: 11/17/2017 CLINICAL DATA:  He is complaining of numbness in his distal arms and legs which has been present for the last 2 weeks. It is been generally getting worse. He also has noted a painful knot in the right rib cage. He denies fever, chills, sweats.*comment was truncated* in a have a EXAM: METASTATIC BONE SURVEY COMPARISON:  Head -CT 11/17/2017 FINDINGS: Skull: Multiple lytic lesions throughout the calvarium. Lytic lesions scalloped the cortex of the LEFT and RIGHT humeri and clavicles. Lytic lesions in the radius and ulna of the forearms Lytic lesions scalloping enter cortex of the LEFT RIGHT femur. Lytic lesions within the fibulae. IMPRESSION: 1. Lytic lesions scalloping the inner cortex of the axillary and appendicular skeleton including the skull, clavicles, humeri, femurs and distal long bones. 2. Findings most suggestive multiple myeloma. Electronically Signed   By: Suzy Bouchard M.D.   On: 11/17/2017 09:50   Ct Bone Marrow Biopsy & Aspiration  Result Date: 11/20/2017 INDICATION: 61 year old male with a history of multiple myeloma EXAM: CT BONE MARROW BIOPSY AND ASPIRATION MEDICATIONS: None. ANESTHESIA/SEDATION: Moderate (conscious) sedation was employed during  this procedure. A total of Versed 2.0 mg and Fentanyl 2 Hunter mcg was administered intravenously. Moderate Sedation Time: 11 minutes. The patient's level of consciousness and vital signs were monitored continuously by radiology nursing throughout the procedure under my direct supervision. FLUOROSCOPY TIME:  CT COMPLICATIONS: None PROCEDURE: The procedure risks, benefits, and alternatives  were explained to the patient. Questions regarding the procedure were encouraged and answered. The patient understands and consents to the procedure. Scout CT of the pelvis was performed for surgical planning purposes. The posterior pelvis was prepped with chlorhexidinein a sterile fashion, and a sterile drape was applied covering the operative field. A sterile gown and sterile gloves were used for the procedure. Local anesthesia was provided with 1% Lidocaine. We targeted the left posterior iliac bone for biopsy. The skin and subcutaneous tissues were infiltrated with 1% lidocaine without epinephrine. A small stab incision was made with an 11 blade scalpel, and an 11 gauge Murphy needle was advanced with CT guidance to the posterior cortex. Manual forced was used to advance the needle through the posterior cortex and the stylet was removed. A bone marrow aspirate was retrieved and passed to a cytotechnologist in the room. The Murphy needle was then advanced without the stylet for a core biopsy. The core biopsy was retrieved and also passed to a cytotechnologist. Manual pressure was used for hemostasis and a sterile dressing was placed. No complications were encountered no significant blood loss was encountered. Patient tolerated the procedure well and remained hemodynamically stable throughout. IMPRESSION: Status post CT-guided bone marrow biopsy, with tissue specimen sent to pathology for complete histopathologic analysis Signed, Dulcy Fanny. Earleen Newport, DO [facility]  Electronically Signed   By: Corrie Mckusick D.O.   On: 11/20/2017 10:06   Dg C-arm 1-60 Min  Result Date: 11/20/2017 CLINICAL DATA:  Status post inter medullary nail and screw placement for impending right femur fracture due to metastatic disease. Multiple myeloma. EXAM: RIGHT FEMUR PORTABLE 2 VIEW; DG C-ARM 61-120 MIN COMPARISON:  CT scan of the femurs of November 17, 2017 FINDINGS: Fluoro time reported is 1 minutes, 24 seconds. Two fluoro spot images are observed. An intramedullary rod within the femoral shaft present. There are 2 telescoping screws in the right femoral neck and head and securing screw in the lower right femoral shaft. There is no immediate postprocedure complication. IMPRESSION: No immediate postprocedure complication following intramedullary nail placement of the right femur. Electronically Signed   By: David  Martinique M.D.   On: 11/20/2017 13:41   Dg Femur Port, Min 2 Views Right  Result Date: 11/20/2017 CLINICAL DATA:  Status post inter medullary nail and screw placement for impending right femur fracture due to metastatic disease. Multiple myeloma. EXAM: RIGHT FEMUR PORTABLE 2 VIEW; DG C-ARM 61-120 MIN COMPARISON:  CT scan of the femurs of November 17, 2017 FINDINGS: Fluoro time reported is 1 minutes, 24 seconds. Two fluoro spot images are observed. An intramedullary rod within the femoral shaft present. There are 2 telescoping screws in the right femoral neck and head and securing screw in the lower right femoral shaft. There is no immediate postprocedure complication. IMPRESSION: No immediate postprocedure complication following intramedullary nail placement of the right femur. Electronically Signed   By: David  Martinique M.D.   On: 11/20/2017 13:41    ASSESSMENT & PLAN:   61 y.o. AA male presenting with severe hypercalcemia, skeletal findings consistent with myeloma and significant leukocytosis/lymphocytosis in the peripheral bloodas well as AKI and hypercalcemia.  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  Prelim- BM Bx showed 90-95% involvement with kappa restricted serum free light chains. > 20% plasma cell in the peripheral blood concerning for plasma cell  leukemia.  Lymphocytosis/leukocytosis somewhat improved with high-dose steroids.  2) s/p Prophylactic IM nailing for rt femur.  3) high risk of tumor lysis syndrome with initial treatment.  4) h/o tobacco abuse  5) h/o cocaine and ETOH abuse -- sober for 3 yrs  6) AKI vs ARF on CKD -- due to myeloma.  (Creatinine stable today) .  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 Hemoglobin down to 7 -- now up to 8.1 s/p PRBC transfusion We will transfuse 2 units of PRBCs today orders placed. informed consent obtained from patient.  8) Leucocytosis/Lymphocytosis -primarily from plasma cell leukemia.  Improving with High dose Dexamethasone and Velcade. Lymphocyte count down from 29k to 4.9k  9) TLS (uric acid WNL today) -Prophylaxis with allopurinol 100 mg daily renally adjusted dose -Monitor uric acid daily along with electrolytes including potassium.  10) Hyperkalemia due to some TLS + renal insufficiency - resolved with Kayexalate   11) Fever 102.67F - ? Related to tumor lysis vs Medications(Velcade) , r/o sepsis/infection.  PLAN -patient transferred appropriately for close monitoring given fever and tachycardia. -septic w/u, lactate, procalcitonin. -if elevated lactate/procalcitonin or signs of hemodynamic instability or recurrent fevers or signs of infections -- would need to empirically start on broad spectrum antibiotics. (lactate 2 -- started on Cefepime + Vancomycin  per hospitalist) -CT chest/abd/pelvis to evaluate source of infection and evaluate rt flank/lower back pain. -transfuse PRBCs prn for symptomatic anemia and hemoglobin of 7. -IVF with bicarb support to try to maintain urine output of atleast 2 liters daily with strict IO monitoring -will hold cyclophosphamide and additional Dexamethasone pending --septic workup. -renally adjusted allopurinol and as needed rasburicase for management of possible tumor lysis syndrome -Daily CBC with differential and CMP -Currently calcium levels are okay and there is no indication for additional bisphosphonate immediately at this time -We will need bisphosphonates every 4 weeks or for treatment of hypercalcemia.  Will prefer pamidronate given his renal insufficiency.     -will f/u on final BM Bx results and Cytogenetics. (Discussed Prelim results with Dr Melina Copa -pathology)  6) multiple myeloma related skeletal pain -Oxycodone 5-10 mg every 6 hours as needed -changed prn IV Morphine to IV dilaudid given renal insuff with risk of accumulation or morphine metabolites -Senna S for bowel prophylaxis  I shall continue to follow daily. Appreciate excellent hospital medicine cares.   I spent 25 minutes counseling the patient face to face. The total time spent in the appointment was 35 minutes and more than 50% was on counseling and direct patient cares.    Sullivan Lone MD [place] AAHIVMS [facility] Hematology/Oncology Physician [facility]  (Office):       [phone] (Work cell):  [phone] (Fax):           [phone]  11/25/2017 12:33 PM

## 2017-11-25 NOTE — Plan of Care (Signed)
  Progressing Clinical Measurements: Ability to maintain clinical measurements within normal limits will improve 11/25/2017 1811 - Progressing by Naomie Dean, RN Will remain free from infection 11/25/2017 1811 - Progressing by Naomie Dean, RN Diagnostic test results will improve 11/25/2017 1811 - Progressing by Naomie Dean, RN Respiratory complications will improve 11/25/2017 1811 - Progressing by Naomie Dean, RN Cardiovascular complication will be avoided 11/25/2017 1811 - Progressing by Naomie Dean, RN Activity: Risk for activity intolerance will decrease 11/25/2017 1811 - Progressing by Naomie Dean, RN   Adequate for Discharge Health Behavior/Discharge Planning: Ability to manage health-related needs will improve 11/25/2017 1811 - Adequate for Discharge by Naomie Dean, RN

## 2017-11-25 NOTE — Progress Notes (Signed)
CRITICAL VALUE ALERT  Critical Value: Lactic acid 2.2  Date & Time Notied: 8828M 11/25/2017   Provider Notified: Yes  Orders Received/Actions taken: 500 mL bolus. UA. Urine culture. ABx.   Naomie Dean, RN

## 2017-11-25 NOTE — Progress Notes (Signed)
OT Cancellation Note  Patient Details Name: Andres Fernandez MRN: [id number] DOB: 11-09-1956   Cancelled Treatment:    Reason Eval/Treat Not Completed: Other (comment).  Noted rapid response called this am for low BP and MS change. Will check back tomorrow.  [name] 11/25/2017, 8:33 AM  Lesle Chris, OTR/L [phone] 11/25/2017

## 2017-11-25 NOTE — Progress Notes (Addendum)
Pharmacy Antibiotic Note  Andres Fernandez is a 61 y.o. male admitted on 11/23/2017 with sepsis.  Pharmacy has been consulted for vancomycin and cefepime dosing.  Recent diagnosis of plasma cell leukemia/multiple myeloma with 1st dose of velcade given 12/15.  Patient developed fevers and tachycardia 12/16.  Today, 11/25/2017  Renal: AKI vs CKD - suspect due to MM  Tmax = 102.6  PCT = 3.42  LA elevated  WBC improved following steroids/velcade  Plan:  Vancomycin 1568m x 1 then 1gm IV q48h  Cefepime 2gm IV q24h per renal fx  Monitor renal function  Check vancomycin levels as appropriate  Height: 5' 8"  (172.7 cm) Weight: 154 lb 8.7 oz (70.1 kg) IBW/kg (Calculated) : 68.4  Temp (24hrs), Avg:99.5 F (37.5 C), Min:98.2 F (36.8 C), Max:102.6 F (39.2 C)  Recent Labs  Lab 11/20/17 0150  11/22/17 0405 11/23/17 1800 11/24/17 0344 11/24/17 1420 11/25/17 0352 11/25/17 0813 11/25/17 1120  WBC 31.7*  --  43.2* 55.3* 42.4*  --  15.4*  --   --   CREATININE 4.31*   < > 3.65* 3.80* 3.87* 3.86* 3.80*  --   --   LATICACIDVEN  --   --   --   --   --   --   --  2.0* 2.2*   < > = values in this interval not displayed.    Estimated Creatinine Clearance: 20 mL/min (A) (by C-G formula based on SCr of 3.8 mg/dL (H)).    No Known Allergies  Antimicrobials this admission: 12/16 vanco 12/16 cefepime  Dose adjustments this admission:   Microbiology results: 12/16 BCx:  12/16 UCx:     Thank you for allowing pharmacy to be a part of this patient's care.  DDoreene Eland PharmD, BCPS.   Pager: [phone]/16/2018 12:32 PM

## 2017-11-25 NOTE — Progress Notes (Signed)
Called to department by RN.  Patient slightly confused per RN.  On assessment HR 131, BP 99/53, res 20, sating 93% on RA.  EKG obtained and showed sinus tachycardia.  Pt oriented to person, place, and time.  MD notified orders given to bolus with 500 cc of NS, tylenol, and transfer to telemetry bed.  orders carried out, will continue to monitor pt closely.  Trennon Torbeck RN

## 2017-11-26 ENCOUNTER — Inpatient Hospital Stay (HOSPITAL_COMMUNITY): Payer: BLUE CROSS/BLUE SHIELD

## 2017-11-26 DIAGNOSIS — E875 Hyperkalemia: Secondary | ICD-10-CM

## 2017-11-26 DIAGNOSIS — A419 Sepsis, unspecified organism: Secondary | ICD-10-CM

## 2017-11-26 DIAGNOSIS — R0989 Other specified symptoms and signs involving the circulatory and respiratory systems: Secondary | ICD-10-CM

## 2017-11-26 LAB — COMPREHENSIVE METABOLIC PANEL
ALT: 39 U/L (ref 17–63)
ANION GAP: 8 (ref 5–15)
AST: 64 U/L — ABNORMAL HIGH (ref 15–41)
Albumin: 2.5 g/dL — ABNORMAL LOW (ref 3.5–5.0)
Alkaline Phosphatase: 54 U/L (ref 38–126)
BUN: 60 mg/dL — ABNORMAL HIGH (ref 6–20)
CHLORIDE: 115 mmol/L — AB (ref 101–111)
CO2: 18 mmol/L — ABNORMAL LOW (ref 22–32)
CREATININE: 4.44 mg/dL — AB (ref 0.61–1.24)
Calcium: 7 mg/dL — ABNORMAL LOW (ref 8.9–10.3)
GFR, EST AFRICAN AMERICAN: 15 mL/min — AB (ref >60)
GFR, EST NON AFRICAN AMERICAN: 13 mL/min — AB (ref >60)
Glucose, Bld: 132 mg/dL — ABNORMAL HIGH (ref 65–99)
Potassium: 5.6 mmol/L — ABNORMAL HIGH (ref 3.5–5.1)
SODIUM: 141 mmol/L (ref 135–145)
Total Bilirubin: 0.9 mg/dL (ref 0.3–1.2)
Total Protein: 5.3 g/dL — ABNORMAL LOW (ref 6.5–8.1)

## 2017-11-26 LAB — CBC WITH DIFFERENTIAL/PLATELET
BAND NEUTROPHILS: 46 %
BASOS PCT: 0 %
Basophils Absolute: 0 10*3/uL (ref 0.0–0.1)
Blasts: 0 %
EOS ABS: 0 10*3/uL (ref 0.0–0.7)
EOS PCT: 0 %
HCT: 22.6 % — ABNORMAL LOW (ref 39.0–52.0)
Hemoglobin: 7.7 g/dL — ABNORMAL LOW (ref 13.0–17.0)
LYMPHS ABS: 1.7 10*3/uL (ref 0.7–4.0)
LYMPHS PCT: 20 %
MCH: 30 pg (ref 26.0–34.0)
MCHC: 34.1 g/dL (ref 30.0–36.0)
MCV: 87.9 fL (ref 78.0–100.0)
MONO ABS: 0.2 10*3/uL (ref 0.1–1.0)
MONOS PCT: 2 %
Metamyelocytes Relative: 2 %
Myelocytes: 0 %
NEUTROS ABS: 6.5 10*3/uL (ref 1.7–7.7)
NEUTROS PCT: 28 %
NRBC: 0 /100{WBCs}
OTHER: 2 %
PLATELETS: 126 10*3/uL — AB (ref 150–400)
Promyelocytes Absolute: 0 %
RBC: 2.57 MIL/uL — ABNORMAL LOW (ref 4.22–5.81)
RDW: 16.4 % — AB (ref 11.5–15.5)
WBC MORPHOLOGY: INCREASED
WBC: 8.6 10*3/uL (ref 4.0–10.5)

## 2017-11-26 LAB — HCV RNA (INTERNATIONAL UNITS)
HCV log10: 7.009 log10 IU/mL
Hcv Rna (International Units): 10200000 IU/mL

## 2017-11-26 LAB — URINE CULTURE: Culture: <10000 — AB

## 2017-11-26 LAB — PROTIME-INR
INR: 1.28
Prothrombin Time: 15.9 seconds — ABNORMAL HIGH (ref 11.4–15.2)

## 2017-11-26 LAB — HEPATITIS C GENOTYPE

## 2017-11-26 LAB — KAPPA/LAMBDA LIGHT CHAINS
Kappa free light chain: 13734.8 mg/L — ABNORMAL HIGH (ref 3.3–19.4)
Kappa, lambda light chain ratio: 5971.65 — ABNORMAL HIGH (ref 0.26–1.65)
Lambda free light chains: 2.3 mg/L — ABNORMAL LOW (ref 5.7–26.3)

## 2017-11-26 LAB — PREPARE RBC (CROSSMATCH)

## 2017-11-26 LAB — PROCALCITONIN: PROCALCITONIN: 5.39 ng/mL

## 2017-11-26 LAB — APTT: aPTT: 59 seconds — ABNORMAL HIGH (ref 24–36)

## 2017-11-26 LAB — HCV RNA QUANT RFLX ULTRA OR GENOTYP

## 2017-11-26 LAB — LACTIC ACID, PLASMA: LACTIC ACID, VENOUS: 1 mmol/L (ref 0.5–1.9)

## 2017-11-26 LAB — URIC ACID: URIC ACID, SERUM: 8.4 mg/dL — AB (ref 4.4–7.6)

## 2017-11-26 MED ORDER — SODIUM POLYSTYRENE SULFONATE 15 GM/60ML PO SUSP
30.0000 g | Freq: Once | ORAL | Status: AC
  Administered 2017-11-26: 30 g via ORAL
  Filled 2017-11-26: qty 120

## 2017-11-26 MED ORDER — SODIUM CHLORIDE 0.9 % IV SOLN
250.0000 mL | Freq: Once | INTRAVENOUS | Status: AC
  Administered 2017-11-27: 250 mL via INTRAVENOUS

## 2017-11-26 MED ORDER — ACETAMINOPHEN 325 MG PO TABS
650.0000 mg | ORAL_TABLET | Freq: Once | ORAL | Status: AC
  Administered 2017-11-26: 650 mg via ORAL
  Filled 2017-11-26: qty 2

## 2017-11-26 MED ORDER — HEPARIN SOD (PORK) LOCK FLUSH 100 UNIT/ML IV SOLN
250.0000 [IU] | INTRAVENOUS | Status: DC | PRN
  Filled 2017-11-26: qty 2.5

## 2017-11-26 MED ORDER — SODIUM CHLORIDE 0.9% FLUSH: 3.0000 mL | INTRAVENOUS | Status: DC | PRN

## 2017-11-26 MED ORDER — SODIUM CHLORIDE 0.9 % IV SOLN
6.0000 mg | Freq: Once | INTRAVENOUS | Status: AC
  Administered 2017-11-26: 6 mg via INTRAVENOUS
  Filled 2017-11-26 (×2): qty 4

## 2017-11-26 MED ORDER — HEPARIN SOD (PORK) LOCK FLUSH 100 UNIT/ML IV SOLN
500.0000 [IU] | Freq: Every day | INTRAVENOUS | Status: DC | PRN
  Filled 2017-11-26: qty 5

## 2017-11-26 MED ORDER — SODIUM CHLORIDE 0.9% FLUSH: 10.0000 mL | INTRAVENOUS | Status: DC | PRN

## 2017-11-26 MED ORDER — FUROSEMIDE 10 MG/ML IJ SOLN
20.0000 mg | Freq: Once | INTRAMUSCULAR | Status: AC
  Administered 2017-11-26: 20 mg via INTRAVENOUS
  Filled 2017-11-26: qty 2

## 2017-11-26 NOTE — Evaluation (Signed)
Physical Therapy Evaluation Patient Details Name: Andres Fernandez MRN: [id number] DOB: Mar 22, 1956 Today's Date: 11/26/2017   History of Present Illness  61 yo male admitted with fever, tachycardia. hx of multiple myeloma, drug abuse, R femur IM nail 11/20/17  Clinical Impression  On eval, pt required Min assist for mobility. He walked ~100 feet while using hallway handrail and IV pole for support. Pt refused to use RW for ambulation despite encouragement from therapist. While walking, continued to explain importance of RW use while healing. Towards end of distance, pt c/o dizziness. Vitals WNL once seated in recliner. Will follow and progress activity as tolerated. Pt may refuse HHPT f/u due to having a co-pay.     Follow Up Recommendations Home health PT;Supervision - Intermittent(pt may refuse due to having a co-pay)    Equipment Recommendations  None recommended by PT    Recommendations for Other Services       Precautions / Restrictions Precautions Precautions: Fall Restrictions Weight Bearing Restrictions: No RLE Weight Bearing: Weight bearing as tolerated      Mobility  Bed Mobility               General bed mobility comments: oob in recliner  Transfers Overall transfer level: Needs assistance Equipment used: Rolling walker (2 wheeled) Transfers: Sit to/from Stand Sit to Stand: Min guard         General transfer comment: close guard for safety.   Ambulation/Gait Ambulation/Gait assistance: Min assist Ambulation Distance (Feet): 100 Feet Assistive device: (IV pole and hallway handrail) Gait Pattern/deviations: Step-to pattern;Antalgic;Decreased stance time - right;Decreased weight shift to right;Decreased stride length     General Gait Details: Pt refused to use RW for ambulation during session "I don't need that.". While walking in hallway, educated pt on importance of compliance with walker use for safety while healing.   Stairs             Wheelchair Mobility    Modified Rankin (Stroke Patients Only)       Balance Overall balance assessment: Needs assistance           Standing balance-Leahy Scale: Poor Standing balance comment: requires bilateral UE support                             Pertinent Vitals/Pain Pain Assessment: Faces Faces Pain Scale: Hurts little more Pain Location: R LE Pain Descriptors / Indicators: Discomfort Pain Intervention(s): Monitored during session;Repositioned    Home Living Family/patient expects to be discharged to:: Private residence Living Arrangements: Alone Available Help at Discharge: Family;Friend(s)(staying with fiancee) Type of Home: [place]: Two level;Bed/bath upstairs(fiancee) Home Equipment: Walker - 2 wheels      Prior Function Level of Independence: Independent               Hand Dominance        Extremity/Trunk Assessment   Upper Extremity Assessment Upper Extremity Assessment: Overall WFL for tasks assessed    Lower Extremity Assessment Lower Extremity Assessment: Generalized weakness RLE Deficits / Details: Decr strength and knee flexion due to stiffness and soreness after surgery       Communication   Communication: No difficulties  Cognition Arousal/Alertness: Awake/alert Behavior During Therapy: WFL for tasks assessed/performed Overall Cognitive Status: Within Functional Limits for tasks assessed  General Comments      Exercises     Assessment/Plan    PT Assessment Patient needs continued PT services  PT Problem List Decreased strength;Decreased range of motion;Decreased balance;Decreased mobility;Decreased safety awareness;Decreased activity tolerance;Pain       PT Treatment Interventions DME instruction;Functional mobility training;Balance training;Patient/family education;Therapeutic activities;Gait training;Therapeutic exercise    PT  Goals (Current goals can be found in the Care Plan section)  Acute Rehab PT Goals Patient Stated Goal: return home PT Goal Formulation: With patient Time For Goal Achievement: 12/03/17 Potential to Achieve Goals: Good    Frequency Min 3X/week   Barriers to discharge        Co-evaluation               AM-PAC PT "6 Clicks" Daily Activity  Outcome Measure Difficulty turning over in bed (including adjusting bedclothes, sheets and blankets)?: A Little Difficulty moving from lying on back to sitting on the side of the bed? : A Little Difficulty sitting down on and standing up from a chair with arms (e.g., wheelchair, bedside commode, etc,.)?: A Little Help needed moving to and from a bed to chair (including a wheelchair)?: A Little Help needed walking in hospital room?: A Little Help needed climbing 3-5 steps with a railing? : A Little 6 Click Score: 18    End of Session Equipment Utilized During Treatment: Gait belt Activity Tolerance: Patient limited by fatigue Patient left: in chair;with call bell/phone within reach;with chair alarm set(pt requested to leave leg portion of recliner down so he could exercise his legs)   PT Visit Diagnosis: Muscle weakness (generalized) (M62.81);Pain;Difficulty in walking, not elsewhere classified (R26.2) Pain - Right/Left: Right Pain - part of body: Leg    Time: 9833-8250 PT Time Calculation (min) (ACUTE ONLY): 15 min   Charges:   PT Evaluation $PT Eval Moderate Complexity: 1 Mod     PT G Codes:          Weston Anna, MPT Pager: [phone]

## 2017-11-26 NOTE — Progress Notes (Signed)
[name]   HEMATOLOGY/ONCOLOGY INPATIENT PROGRESS NOTE  Date of Service: 11/26/2017  Inpatient Attending: .Bonnielee Haff, MD   SUBJECTIVE  Patient was seen today for oncologic followup. Creatinine bumped up - nephrology consulted - started on Bicarb. Good urine output. Uric acid increased - received 1 dose of Rasburicase today for TLS. CT showed rt gluteal IM hematoma. Elevated aPTT -likely paraproteinemia related coagulopathy vs sepsis related DIC. Rt gluteal pain improved. Lymphocytosis resolved.  OBJECTIVE:  NAD  PHYSICAL EXAMINATION: . Vitals:   11/26/17 1700 11/26/17 1800 11/26/17 1900 11/26/17 2000  BP:  (!) 142/65  118/65  Pulse: (!) 105 (!) 112 99 95  Resp: (!) 25 (!) 32 (!) 22 (!) 23  Temp:    98.7 F (37.1 C)  TempSrc:    Oral  SpO2: (!) 89% 95% 100% 100%  Weight:      Height:       Filed Weights   11/23/17 1443 11/25/17 0500  Weight: 144 lb (65.3 kg) 154 lb 8.7 oz (70.1 kg)   .Body mass index is 23.5 kg/m.   .  Intake/Output Summary (Last 24 hours) at 11/26/2017 2141 Last data filed at 11/26/2017 2000 Gross per 24 hour  Intake 1450 ml  Output 2925 ml  Net -1475 ml    GENERAL:alert, in no acute distress and comfortable SKIN: no acute rashes, no significant lesions EYES: conjunctiva are pink and non-injected, sclera anicteric OROPHARYNX: MMM, no exudates, no oropharyngeal erythema or ulceration NECK: supple,  JVD+ LYMPH:  no palpable lymphadenopathy in the cervical, axillary or inguinal regions LUNGS: minimal basal rales HEART: regular rate & rhythm ABDOMEN:  normoactive bowel sounds , non tender, not distended. Extremity: no pedal edema PSYCH: alert & oriented x 3 with fluent speech NEURO: no focal motor/sensory deficits   MEDICAL HISTORY:  Past Medical History:  Diagnosis Date  . BPH (benign prostatic hyperplasia)   . Hypertension   . Tobacco abuse     SURGICAL HISTORY: Past Surgical History:  Procedure Laterality Date  . APPENDECTOMY     . FEMUR IM NAIL Right 11/20/2017   Procedure: RIGHT FEMORAL INTRAMEDULLARY (IM) NAIL;  Surgeon: Nicholes Stairs, MD;  Location: [facility];  Service: Orthopedics;  Laterality: Right;    SOCIAL HISTORY: Social History   Socioeconomic History  . Marital status: Single    Spouse name: Not on file  . Number of children: Not on file  . Years of education: Not on file  . Highest education level: Not on file  Social Needs  . Financial resource strain: Not on file  . Food insecurity - worry: Not on file  . Food insecurity - inability: Not on file  . Transportation needs - medical: Not on file  . Transportation needs - non-medical: Not on file  Occupational History  . Not on file  Tobacco Use  . Smoking status: Former Smoker    Packs/day: 0.50    Types: Cigarettes    Last attempt to quit: 11/09/2017    Years since quitting: 0.0  . Smokeless tobacco: Never Used  . Tobacco comment: no cigarettes x 1 week.  Substance and Sexual Activity  . Alcohol use: Yes  . Drug use: No  . Sexual activity: Not on file  Other Topics Concern  . Not on file  Social History Narrative  . Not on file    FAMILY HISTORY: Family History  Problem Relation Age of Onset  . COPD Mother   . Liver cancer Mother  ALLERGIES:  has No Known Allergies.  MEDICATIONS:  Scheduled Meds: . acyclovir  200 mg Oral Daily  . allopurinol  100 mg Oral Daily  . bortezomib SQ  1.3 mg/m2 Subcutaneous Once  . heparin  5,000 Units Subcutaneous Q8H  . polyethylene glycol  17 g Oral Daily  . sodium bicarbonate  650 mg Oral TID   Continuous Infusions: . sodium chloride    . ceFEPime (MAXIPIME) IV Stopped (11/25/17 1429)  . rasburicase (ELITEK) IV infusion    .  sodium bicarbonate  infusion 1000 mL 75 mL/hr at 11/26/17 0549  . sodium chloride    . [START ON 11/27/2017] vancomycin     PRN Meds:.acetaminophen, heparin lock flush, heparin lock flush, HYDROmorphone (DILAUDID) injection, iopamidol, ondansetron  (ZOFRAN) IV, oxyCODONE, sodium chloride flush, sodium chloride flush  REVIEW OF SYSTEMS:    10 Point review of Systems was done is negative except as noted above.   LABORATORY DATA:  I have reviewed the data as listed  . CBC Latest Ref Rng & Units 11/26/2017 11/25/2017 11/24/2017  WBC 4.0 - 10.5 K/uL 8.6 15.4(H) 42.4(H)  Hemoglobin 13.0 - 17.0 g/dL 7.7(L) 8.1(L) 7.0(L)  Hematocrit 39.0 - 52.0 % 22.6(L) 23.3(L) 20.2(L)  Platelets 150 - 400 K/uL 126(L) 148(L) 212   . CBC    Component Value Date/Time   WBC 8.6 11/26/2017 0335   RBC 2.57 (L) 11/26/2017 0335   HGB 7.7 (L) 11/26/2017 0335   HCT 22.6 (L) 11/26/2017 0335   PLT 126 (L) 11/26/2017 0335   MCV 87.9 11/26/2017 0335   MCH 30.0 11/26/2017 0335   MCHC 34.1 11/26/2017 0335   RDW 16.4 (H) 11/26/2017 0335   LYMPHSABS 1.7 11/26/2017 0335   MONOABS 0.2 11/26/2017 0335   EOSABS 0.0 11/26/2017 0335   BASOSABS 0.0 11/26/2017 0335     . CMP Latest Ref Rng & Units 11/26/2017 11/25/2017 11/24/2017  Glucose 65 - 99 mg/dL 132(H) 117(H) 126(H)  BUN 6 - 20 mg/dL 60(H) 49(H) 55(H)  Creatinine 0.61 - 1.24 mg/dL 4.44(H) 3.80(H) 3.86(H)  Sodium 135 - 145 mmol/L 141 139 140  Potassium 3.5 - 5.1 mmol/L 5.6(H) 4.5 4.4  Chloride 101 - 111 mmol/L 115(H) 113(H) 111  CO2 22 - 32 mmol/L 18(L) 17(L) 18(L)  Calcium 8.9 - 10.3 mg/dL 7.0(L) 7.4(L) 7.9(L)  Total Protein 6.5 - 8.1 g/dL 5.3(L) 5.6(L) -  Total Bilirubin 0.3 - 1.2 mg/dL 0.9 0.5 -  Alkaline Phos 38 - 126 U/L 54 56 -  AST 15 - 41 U/L 64(H) 31 -  ALT 17 - 63 U/L 39 27 -    Component     Latest Ref Rng & Units 11/17/2017  Prostatic Specific Antigen     0.00 - 4.00 ng/mL 0.83  HIV Screen 4th Generation wRfx     Non Reactive Non Reactive  TSH     0.350 - 4.500 uIU/mL 1.874  Beta-2 Microglobulin     0.6 - 2.4 mg/L 25.7 (H)             RADIOGRAPHIC STUDIES: I have personally reviewed the radiological images as listed and agreed with the findings in the report. Ct  Abdomen Pelvis Wo Contrast  Result Date: 11/25/2017 CLINICAL DATA:  Fever of unknown origin EXAM: CT CHEST, ABDOMEN AND PELVIS WITHOUT CONTRAST TECHNIQUE: Multidetector CT imaging of the chest, abdomen and pelvis was performed following the standard protocol without IV contrast. COMPARISON:  Left lower extremity CT performed 11/17/2017. FINDINGS: CT CHEST FINDINGS Cardiovascular: Heart is  borderline enlarged. Aorta is normal caliber with scattered aortic calcifications. Mediastinum/Nodes: No mediastinal, hilar, or axillary adenopathy. Lungs/Pleura: Severe centrilobular and paraseptal emphysema. Trace left pleural effusion. No confluent airspace opacities. Musculoskeletal: No acute bony abnormality. CT ABDOMEN PELVIS FINDINGS Hepatobiliary: Pericholecystic fluid present. No visible focal hepatic abnormality. Pancreas: No focal abnormality or ductal dilatation. Spleen: No focal abnormality. Normal size. Small calcification in the superior spleen. Adrenals/Urinary Tract: No adrenal abnormality. No focal renal abnormality. No stones or hydronephrosis. Urinary bladder is unremarkable. Stomach/Bowel: Stomach, large and small bowel grossly unremarkable. Moderate stool burden in the colon. Vascular/Lymphatic: No evidence of aneurysm or adenopathy. Aortic and iliac calcifications. Reproductive: No visible focal abnormality. Other: No free fluid or free air. Musculoskeletal: Stranding noted within the subcutaneous soft tissues overlying the right lateral buttocks. There is heterogeneous material within the right gluteus muscles. These findings are likely related to recent hip surgery. Lytic destructive process noted within the left iliac bone on image 86 measuring up to 2.6 cm, unchanged since prior left lower extremity CT. IMPRESSION: Pericholecystic fluid noted. If there is clinical concern for cholecystitis, this could be further evaluated with ultrasound. Lytic destructive lesion within the left lateral iliac bone  measuring up to 2.6 cm. It is most likely reflects an area of multiple myeloma or metastasis. Intramuscular hematoma within the right gluteus muscles with subcutaneous stranding, compatible with recent surgery. Electronically Signed   By: Rolm Baptise M.D.   On: 11/25/2017 14:28   Dg Chest 2 View  Result Date: 11/17/2017 CLINICAL DATA:  61 year old male with chest pain. EXAM: CHEST  2 VIEW COMPARISON:  Chest radiograph dated 05/10/2015 FINDINGS: Mild right lung base atelectatic changes/ scarring. There is no focal consolidation, pleural effusion, or pneumothorax. The cardiac silhouette is within normal limits. There is mild degenerative changes of the spine. No acute osseous pathology. IMPRESSION: No active cardiopulmonary disease. Electronically Signed   By: Anner Crete M.D.   On: 11/17/2017 07:08   Ct Head Wo Contrast  Result Date: 11/17/2017 CLINICAL DATA:  61 year old male with numbness and tingling. Paraplegia. EXAM: CT HEAD WITHOUT CONTRAST TECHNIQUE: Contiguous axial images were obtained from the base of the skull through the vertex without intravenous contrast. COMPARISON:  Facial bone CT dated 03/27/2010 FINDINGS: Brain: The ventricles and sulci appropriate size for patient's age. Minimal periventricular and deep white matter chronic microvascular ischemic changes noted. There is no acute intracranial hemorrhage. No mass effect or midline shift noted. No extra-axial fluid collection. Vascular: There is slight high attenuation of the right MCA in the sylvian fissure (series 7 image 12). This may be chronic. If there is clinical concern for right MCA territory ischemia further evaluation with CT angiography of the head or MRI is recommended. Skull: There are innumerable lucent calvarial lesions concerning for multiple myeloma or metastatic disease. Correlation with history of known malignancy recommended. No acute fracture. Sinuses/Orbits: There is complete opacification of the visualized right  maxillary sinus. Mild mucoperiosteal thickening of paranasal sinuses. There is a lytic lesion involving the posterior right mastoid versus effusion. Other: None IMPRESSION: 1. No acute intracranial hemorrhage. 2. Slightly high attenuation of the right MCA in the right sylvian fissure which may be chronic. Further evaluation with CTA or MRI is recommended if there is high clinical concern for acute right MCA territory ischemia. 3. 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 Correlation with history of malignancy recommended. Electronically Signed   By: Anner Crete M.D.   On: 11/17/2017 06:05   Ct Chest Wo Contrast  Result Date: 11/25/2017 CLINICAL DATA:  Fever of unknown origin EXAM: CT CHEST, ABDOMEN AND PELVIS WITHOUT CONTRAST TECHNIQUE: Multidetector CT imaging of the chest, abdomen and pelvis was performed following the standard protocol without IV contrast. COMPARISON:  Left lower extremity CT performed 11/17/2017. FINDINGS: CT CHEST FINDINGS Cardiovascular: Heart is borderline enlarged. Aorta is normal caliber with scattered aortic calcifications. Mediastinum/Nodes: No mediastinal, hilar, or axillary adenopathy. Lungs/Pleura: Severe centrilobular and paraseptal emphysema. Trace left pleural effusion. No confluent airspace opacities. Musculoskeletal: No acute bony abnormality. CT ABDOMEN PELVIS FINDINGS Hepatobiliary: Pericholecystic fluid present. No visible focal hepatic abnormality. Pancreas: No focal abnormality or ductal dilatation. Spleen: No focal abnormality. Normal size. Small calcification in the superior spleen. Adrenals/Urinary Tract: No adrenal abnormality. No focal renal abnormality. No stones or hydronephrosis. Urinary bladder is unremarkable. Stomach/Bowel: Stomach, large and small bowel grossly unremarkable. Moderate stool burden in the colon. Vascular/Lymphatic: No evidence of aneurysm or adenopathy. Aortic and iliac calcifications. Reproductive: No visible focal  abnormality. Other: No free fluid or free air. Musculoskeletal: Stranding noted within the subcutaneous soft tissues overlying the right lateral buttocks. There is heterogeneous material within the right gluteus muscles. These findings are likely related to recent hip surgery. Lytic destructive process noted within the left iliac bone on image 86 measuring up to 2.6 cm, unchanged since prior left lower extremity CT. IMPRESSION: Pericholecystic fluid noted. If there is clinical concern for cholecystitis, this could be further evaluated with ultrasound. Lytic destructive lesion within the left lateral iliac bone measuring up to 2.6 cm. It is most likely reflects an area of multiple myeloma or metastasis. Intramuscular hematoma within the right gluteus muscles with subcutaneous stranding, compatible with recent surgery. Electronically Signed   By: Rolm Baptise M.D.   On: 11/25/2017 14:28   US Renal  Result Date: 11/17/2017 CLINICAL DATA:  Acute renal injury EXAM: RENAL / URINARY TRACT ULTRASOUND COMPLETE COMPARISON:  None. FINDINGS: Right Kidney: Length: 10.3 cm. Increased cortical echogenicity. No hydronephrosis. Than renal cortex. Left Kidney: Length: 10.2 cm. Increased cortical echogenicity. No hydronephrosis. Thinned renal cortex. Bladder: Appears normal for degree of bladder distention. IMPRESSION: 1. No hydronephrosis. 2. Increased cortical echogenicity and thinned renal cortex suggesting medical renal disease . Electronically Signed   By: Suzy Bouchard M.D.   On: 11/17/2017 08:32   Ct Femur Left Wo Contrast  Result Date: 11/18/2017 CLINICAL DATA:  Bilateral upper leg pain, worse on the right for 2 weeks. Diffuse weakness. Lytic lesions in the femurs on osseous survey. EXAM: CT OF THE LOWER LEFT EXTREMITY WITHOUT CONTRAST TECHNIQUE: Multidetector CT imaging of the lower left extremity was performed according to the standard protocol. COMPARISON:  Osseous survey 11/17/2017. FINDINGS:  Bones/Joint/Cartilage No fracture is identified on the right or left. Multiple foci of endosteal scalloping are seen in the femurs bilaterally consistent with lytic lesions on plain films. Tumor burden appears fairly symmetric on the right and left. Lytic lesions are also identified in the right and left acetabuli. On the left, cortical disruption is seen adjacent to the femoral head in the anterior wall measuring 1.4 cm AP, extending 0.9 cm into bone medially and measuring 2.2 cm craniocaudal. The inner cortical wall is not disrupted. On the right, lysis extends across the medial wall of the acetabulum where there is cortical destruction adjacent to the femoral head measuring 3.5 cm AP AP by 2.0 cm craniocaudal. The inner cortical wall is not disrupted. Multiple foci of fatty marrow replacement are identified in both femurs consistent with tumor deposits. Ligaments  Suboptimally assessed by CT. Muscles and Tendons Intact and normal appearance. Soft tissues No acute abnormality. IMPRESSION: Negative for fracture. Multiple lytic lesions are present throughout both femurs with an appearance most consistent with multiple myeloma. Lytic lesions are also seen in the walls of the acetabuli bilaterally most consistent with multiple myeloma. As described above, the inner cortical wall of both the right and left acetabulum is preserved with destruction of the outer cortical wall identified. Electronically Signed   By: Inge Rise M.D.   On: 11/18/2017 10:24   Ct Femur Right Wo Contrast  Result Date: 11/18/2017 CLINICAL DATA:  Bilateral upper leg pain, worse on the right for 2 weeks. Diffuse weakness. Lytic lesions in the femurs on osseous survey. EXAM: CT OF THE LOWER LEFT EXTREMITY WITHOUT CONTRAST TECHNIQUE: Multidetector CT imaging of the lower left extremity was performed according to the standard protocol. COMPARISON:  Osseous survey 11/17/2017. FINDINGS: Bones/Joint/Cartilage No fracture is identified on the  right or left. Multiple foci of endosteal scalloping are seen in the femurs bilaterally consistent with lytic lesions on plain films. Tumor burden appears fairly symmetric on the right and left. Lytic lesions are also identified in the right and left acetabuli. On the left, cortical disruption is seen adjacent to the femoral head in the anterior wall measuring 1.4 cm AP, extending 0.9 cm into bone medially and measuring 2.2 cm craniocaudal. The inner cortical wall is not disrupted. On the right, lysis extends across the medial wall of the acetabulum where there is cortical destruction adjacent to the femoral head measuring 3.5 cm AP AP by 2.0 cm craniocaudal. The inner cortical wall is not disrupted. Multiple foci of fatty marrow replacement are identified in both femurs consistent with tumor deposits. Ligaments Suboptimally assessed by CT. Muscles and Tendons Intact and normal appearance. Soft tissues No acute abnormality. IMPRESSION: Negative for fracture. Multiple lytic lesions are present throughout both femurs with an appearance most consistent with multiple myeloma. Lytic lesions are also seen in the walls of the acetabuli bilaterally most consistent with multiple myeloma. As described above, the inner cortical wall of both the right and left acetabulum is preserved with destruction of the outer cortical wall identified. Electronically Signed   By: Inge Rise M.D.   On: 11/18/2017 10:24   Dg Chest Port 1 View  Result Date: 11/26/2017 CLINICAL DATA:  61 year old male with shortness of breath and bibasilar crackles EXAM: PORTABLE CHEST 1 VIEW COMPARISON:  11/25/2017 CT, 11/17/2017 chest radiograph and prior studies. FINDINGS: Upper limits normal heart size noted. Pulmonary vascular congestion and interstitial opacities are present likely representing interstitial edema. Increased opacity at the right lung base may represent atelectasis, focal edema or possibly superimposed pneumonia. A lytic lesion  within the anterolateral left eighth rib is noted. IMPRESSION: 1. Pulmonary vascular congestion and probable interstitial pulmonary edema. 2. Right basilar opacity which may represent atelectasis, focal edema or possibly superimposed pneumonia. 3. Left eighth rib lytic lesion -question multiple myeloma or metastatic disease. Electronically Signed   By: Margarette Canada M.D.   On: 11/26/2017 11:08   Dg Bone Survey Met  Result Date: 11/17/2017 CLINICAL DATA:  He is complaining of numbness in his distal arms and legs which has been present for the last 2 weeks. It is been generally getting worse. He also has noted a painful knot in the right rib cage. He denies fever, chills, sweats.*comment was truncated* in a have a EXAM: METASTATIC BONE SURVEY COMPARISON:  Head -CT 11/17/2017 FINDINGS: Skull: Multiple lytic  lesions throughout the calvarium. Lytic lesions scalloped the cortex of the LEFT and RIGHT humeri and clavicles. Lytic lesions in the radius and ulna of the forearms Lytic lesions scalloping enter cortex of the LEFT RIGHT femur. Lytic lesions within the fibulae. IMPRESSION: 1. Lytic lesions scalloping the inner cortex of the axillary and appendicular skeleton including the skull, clavicles, humeri, femurs and distal long bones. 2. Findings most suggestive multiple myeloma. Electronically Signed   By: Suzy Bouchard M.D.   On: 11/17/2017 09:50   Ct Bone Marrow Biopsy & Aspiration  Result Date: 11/20/2017 INDICATION: 61 year old male with a history of multiple myeloma EXAM: CT BONE MARROW BIOPSY AND ASPIRATION MEDICATIONS: None. ANESTHESIA/SEDATION: Moderate (conscious) sedation was employed during this procedure. A total of Versed 2.0 mg and Fentanyl 2 Hunter mcg was administered intravenously. Moderate Sedation Time: 11 minutes. The patient's level of consciousness and vital signs were monitored continuously by radiology nursing throughout the procedure under my direct supervision. FLUOROSCOPY TIME:  CT  COMPLICATIONS: None PROCEDURE: The procedure risks, benefits, and alternatives were explained to the patient. Questions regarding the procedure were encouraged and answered. The patient understands and consents to the procedure. Scout CT of the pelvis was performed for surgical planning purposes. The posterior pelvis was prepped with chlorhexidinein a sterile fashion, and a sterile drape was applied covering the operative field. A sterile gown and sterile gloves were used for the procedure. Local anesthesia was provided with 1% Lidocaine. We targeted the left posterior iliac bone for biopsy. The skin and subcutaneous tissues were infiltrated with 1% lidocaine without epinephrine. A small stab incision was made with an 11 blade scalpel, and an 11 gauge Murphy needle was advanced with CT guidance to the posterior cortex. Manual forced was used to advance the needle through the posterior cortex and the stylet was removed. A bone marrow aspirate was retrieved and passed to a cytotechnologist in the room. The Murphy needle was then advanced without the stylet for a core biopsy. The core biopsy was retrieved and also passed to a cytotechnologist. Manual pressure was used for hemostasis and a sterile dressing was placed. No complications were encountered no significant blood loss was encountered. Patient tolerated the procedure well and remained hemodynamically stable throughout. IMPRESSION: Status post CT-guided bone marrow biopsy, with tissue specimen sent to pathology for complete histopathologic analysis Signed, Dulcy Fanny. Earleen Newport, DO [facility] Electronically Signed   By: Corrie Mckusick D.O.   On: 11/20/2017 10:06   Dg C-arm 1-60 Min  Result Date: 11/20/2017 CLINICAL DATA:  Status post inter medullary nail and screw placement for impending right femur fracture due to metastatic disease. Multiple myeloma. EXAM: RIGHT FEMUR PORTABLE 2 VIEW; DG C-ARM 61-120 MIN  COMPARISON:  CT scan of the femurs of November 17, 2017 FINDINGS: Fluoro time reported is 1 minutes, 24 seconds. Two fluoro spot images are observed. An intramedullary rod within the femoral shaft present. There are 2 telescoping screws in the right femoral neck and head and securing screw in the lower right femoral shaft. There is no immediate postprocedure complication. IMPRESSION: No immediate postprocedure complication following intramedullary nail placement of the right femur. Electronically Signed   By: David  Martinique M.D.   On: 11/20/2017 13:41   Dg Femur Port, Min 2 Views Right  Result Date: 11/20/2017 CLINICAL DATA:  Status post inter medullary nail and screw placement for impending right femur fracture due to metastatic disease. Multiple myeloma. EXAM: RIGHT FEMUR PORTABLE 2 VIEW; DG C-ARM  61-120 MIN COMPARISON:  CT scan of the femurs of November 17, 2017 FINDINGS: Fluoro time reported is 1 minutes, 24 seconds. Two fluoro spot images are observed. An intramedullary rod within the femoral shaft present. There are 2 telescoping screws in the right femoral neck and head and securing screw in the lower right femoral shaft. There is no immediate postprocedure complication. IMPRESSION: No immediate postprocedure complication following intramedullary nail placement of the right femur. Electronically Signed   By: David  Martinique M.D.   On: 11/20/2017 13:41    ASSESSMENT & PLAN:   61 y.o. AA male presenting with severe hypercalcemia, skeletal findings consistent with myeloma and significant leukocytosis/lymphocytosis in the peripheral bloodas well as AKI and hypercalcemia.  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  Prelim- BM Bx showed >95% involvement with kappa restricted serum free light chains. > 20% plasma cell in the peripheral blood  concerning for plasma cell leukemia.  Lymphocytosis/leukocytosis resolved with high-dose steroids and Velcade. Plan -Velcade 2nd dose tomorrow -will hold Cyclophosphamide in the setting of sepsis.  2) s/p Prophylactic IM nailing for rt femur.  3)TLS - uric acid higher today Plan -continue Allopurinol -received 1 dose of Rasburicase today -monitor daily Uric acid.  4) h/o tobacco abuse  5) h/o cocaine and ETOH abuse -- sober for 3 yrs  6) AKI vs ARF on CKD -- due to myeloma.  (Creatinine higher today) - multifactorial MM/TLS/sepsis/antibiotics .  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 Hemoglobin down to 7 -- now up to 8.1 s/p PRBC transfusion and trending down to 7.7. Some drop in hgb due to rt gluteal hematoma Plan We will transfuse 1 units of PRBCs today orders placed to maintain hgb>8  10) Hyperkalemia due to some TLS + renal insufficiency -being monitored K 5.6   11) Sepsis - elevated procalcitonin ..concern for pneumonia PLAN -on broad spectrum abx  6) multiple myeloma related skeletal pain -Oxycodone 5-10 mg every 6 hours as needed -prn IV dilaudid given renal insuff with risk of accumulation or morphine metabolites -Senna S for bowel prophylaxis  I shall continue to follow daily. Appreciate excellent hospital medicine cares.   I spent 25 minutes counseling the patient face to face. The total time spent in the appointment was 35 minutes and more than 50% was on counseling and direct patient cares.    Sullivan Lone MD [place] AAHIVMS [facility] Hematology/Oncology Physician [facility]  (Office):       [phone] (Work cell):  [phone] (Fax):           [phone]  11/26/2017 12:07  PM

## 2017-11-26 NOTE — Progress Notes (Signed)
[facility] ROUNDING NOTE   Subjective:   Interval History: 61 year old male with recent hospitalization where he was found to have lytic lesions.  He also had an impending pathological fracture in the right femur requiring intramedullary nail placement by orthopedics.  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  Patient was hospitalized and started on chemotherapy.  Overnight on 12/15 patient developed a fever and tachycardia.  He was noted to be hypotensive.   Patient was seen last week at [facility] for hypercalcemia and acute kidney injury  His renal function was improving on discharge to the 3 range   He appears to have fairly good urine output at this time  His creatinine has increased into the range of 4  He has been started on broad spectrum antibiots  Vancomycin and Cefipime    Objective:  Vital signs in last 24 hours:  Temp:  [97.6 F (36.4 C)-98.4 F (36.9 C)] 98 F (36.7 C) (12/17 1200) Pulse Rate:  [89-130] 96 (12/17 1100) Resp:  [21-42] 22 (12/17 1100) BP: (96-137)/(44-89) 122/73 (12/17 1000) SpO2:  [81 %-100 %] 97 % (12/17 1100)  Weight change:  Filed Weights   11/23/17 1443 11/25/17 0500  Weight: 144 lb (65.3 kg) 154 lb 8.7 oz (70.1 kg)    Intake/Output: I/O last 3 completed shifts: In: 1956.3 [P.O.:480; I.V.:1026.3; Blood:400; IV Piggyback:50] Out: 7078 [Urine:2495]   Intake/Output this shift:  Total I/O In: 400 [P.O.:400] Out: 1100 [Urine:1100]  CVS- RRR RS- CTA ABD- BS present soft non-distended EXT- no edema   Basic Metabolic Panel: Recent Labs  Lab 11/20/17 0150 11/21/17 0340 11/22/17 0405 11/23/17 1800 11/24/17 0344 11/24/17 1420 11/25/17 0352 11/26/17 0335  NA 133* 134* 138 139 136 140 139 141  K 4.7 5.0 5.0 4.8 5.9* 4.4 4.5 5.6*  CL 107 109 113* 113* 111 111 113* 115*  CO2 18* 16* 16* 19* 19* 18* 17* 18*  GLUCOSE 137* 131* 119* 110* 139* 126* 117*  132*  BUN 51* 54* 54* 52* 53* 55* 49* 60*  CREATININE 4.31* 3.89* 3.65* 3.80* 3.87* 3.86* 3.80* 4.44*  CALCIUM 8.9 8.7* 8.5* 8.3* 8.0* 7.9* 7.4* 7.0*  PHOS 3.5 3.7 2.8  --   --   --   --   --     Liver Function Tests: Recent Labs  Lab 11/22/17 0405 11/23/17 1800 11/24/17 0344 11/25/17 0352 11/26/17 0335  AST  --  35 29 31 64*  ALT  --  27 25 27  39  ALKPHOS  --  58 53 56 54  BILITOT  --  0.5 0.6 0.5 0.9  PROT  --  6.3* 5.9* 5.6* 5.3*  ALBUMIN 3.0* 3.2* 3.0* 2.7* 2.5*   No results for input(s): LIPASE, AMYLASE in the last 168 hours. No results for input(s): AMMONIA in the last 168 hours.  CBC: Recent Labs  Lab 11/22/17 0405 11/23/17 1800 11/24/17 0344 11/25/17 0352 11/26/17 0335  WBC 43.2* 55.3* 42.4* 15.4* 8.6  NEUTROABS 16.8* 13.3* 11.9* 9.9* 6.5  HGB 8.3* 8.1* 7.0* 8.1* 7.7*  HCT 23.1* 23.0* 20.2* 23.3* 22.6*  MCV 87.8 88.8 88.2 86.6 87.9  PLT 179 213 212 148* 126*    Cardiac Enzymes: No results for input(s): CKTOTAL, CKMB, CKMBINDEX, TROPONINI in the last 168 hours.  BNP: Invalid input(s): POCBNP  CBG: No results for input(s): GLUCAP in the last 168 hours.  Microbiology: Results for orders placed or performed during the hospital encounter of  11/23/17  MRSA PCR Screening     Status: None   Collection Time: 11/25/17  9:41 AM  Result Value Ref Range Status   MRSA by PCR NEGATIVE NEGATIVE Final    Comment:        The GeneXpert MRSA Assay (FDA approved for NASAL specimens only), is one component of a comprehensive MRSA colonization surveillance program. It is not intended to diagnose MRSA infection nor to guide or monitor treatment for MRSA infections.     Coagulation Studies: Recent Labs    11/26/17 1229  LABPROT 15.9*  INR 1.28    Urinalysis: Recent Labs    11/25/17 1605  COLORURINE YELLOW  LABSPEC 1.006  PHURINE 5.0  GLUCOSEU NEGATIVE  HGBUR MODERATE*  BILIRUBINUR NEGATIVE  KETONESUR NEGATIVE  PROTEINUR NEGATIVE  NITRITE  NEGATIVE  LEUKOCYTESUR NEGATIVE      Imaging: Ct Abdomen Pelvis Wo Contrast  Result Date: 11/25/2017 CLINICAL DATA:  Fever of unknown origin EXAM: CT CHEST, ABDOMEN AND PELVIS WITHOUT CONTRAST TECHNIQUE: Multidetector CT imaging of the chest, abdomen and pelvis was performed following the standard protocol without IV contrast. COMPARISON:  Left lower extremity CT performed 11/17/2017. FINDINGS: CT CHEST FINDINGS Cardiovascular: Heart is borderline enlarged. Aorta is normal caliber with scattered aortic calcifications. Mediastinum/Nodes: No mediastinal, hilar, or axillary adenopathy. Lungs/Pleura: Severe centrilobular and paraseptal emphysema. Trace left pleural effusion. No confluent airspace opacities. Musculoskeletal: No acute bony abnormality. CT ABDOMEN PELVIS FINDINGS Hepatobiliary: Pericholecystic fluid present. No visible focal hepatic abnormality. Pancreas: No focal abnormality or ductal dilatation. Spleen: No focal abnormality. Normal size. Small calcification in the superior spleen. Adrenals/Urinary Tract: No adrenal abnormality. No focal renal abnormality. No stones or hydronephrosis. Urinary bladder is unremarkable. Stomach/Bowel: Stomach, large and small bowel grossly unremarkable. Moderate stool burden in the colon. Vascular/Lymphatic: No evidence of aneurysm or adenopathy. Aortic and iliac calcifications. Reproductive: No visible focal abnormality. Other: No free fluid or free air. Musculoskeletal: Stranding noted within the subcutaneous soft tissues overlying the right lateral buttocks. There is heterogeneous material within the right gluteus muscles. These findings are likely related to recent hip surgery. Lytic destructive process noted within the left iliac bone on image 86 measuring up to 2.6 cm, unchanged since prior left lower extremity CT. IMPRESSION: Pericholecystic fluid noted. If there is clinical concern for cholecystitis, this could be further evaluated with ultrasound. Lytic  destructive lesion within the left lateral iliac bone measuring up to 2.6 cm. It is most likely reflects an area of multiple myeloma or metastasis. Intramuscular hematoma within the right gluteus muscles with subcutaneous stranding, compatible with recent surgery. Electronically Signed   By: Rolm Baptise M.D.   On: 11/25/2017 14:28   Ct Chest Wo Contrast  Result Date: 11/25/2017 CLINICAL DATA:  Fever of unknown origin EXAM: CT CHEST, ABDOMEN AND PELVIS WITHOUT CONTRAST TECHNIQUE: Multidetector CT imaging of the chest, abdomen and pelvis was performed following the standard protocol without IV contrast. COMPARISON:  Left lower extremity CT performed 11/17/2017. FINDINGS: CT CHEST FINDINGS Cardiovascular: Heart is borderline enlarged. Aorta is normal caliber with scattered aortic calcifications. Mediastinum/Nodes: No mediastinal, hilar, or axillary adenopathy. Lungs/Pleura: Severe centrilobular and paraseptal emphysema. Trace left pleural effusion. No confluent airspace opacities. Musculoskeletal: No acute bony abnormality. CT ABDOMEN PELVIS FINDINGS Hepatobiliary: Pericholecystic fluid present. No visible focal hepatic abnormality. Pancreas: No focal abnormality or ductal dilatation. Spleen: No focal abnormality. Normal size. Small calcification in the superior spleen. Adrenals/Urinary Tract: No adrenal abnormality. No focal renal abnormality. No stones or hydronephrosis. Urinary bladder is unremarkable.  Stomach/Bowel: Stomach, large and small bowel grossly unremarkable. Moderate stool burden in the colon. Vascular/Lymphatic: No evidence of aneurysm or adenopathy. Aortic and iliac calcifications. Reproductive: No visible focal abnormality. Other: No free fluid or free air. Musculoskeletal: Stranding noted within the subcutaneous soft tissues overlying the right lateral buttocks. There is heterogeneous material within the right gluteus muscles. These findings are likely related to recent hip surgery. Lytic  destructive process noted within the left iliac bone on image 86 measuring up to 2.6 cm, unchanged since prior left lower extremity CT. IMPRESSION: Pericholecystic fluid noted. If there is clinical concern for cholecystitis, this could be further evaluated with ultrasound. Lytic destructive lesion within the left lateral iliac bone measuring up to 2.6 cm. It is most likely reflects an area of multiple myeloma or metastasis. Intramuscular hematoma within the right gluteus muscles with subcutaneous stranding, compatible with recent surgery. Electronically Signed   By: Rolm Baptise M.D.   On: 11/25/2017 14:28   Dg Chest Port 1 View  Result Date: 11/26/2017 CLINICAL DATA:  61 year old male with shortness of breath and bibasilar crackles EXAM: PORTABLE CHEST 1 VIEW COMPARISON:  11/25/2017 CT, 11/17/2017 chest radiograph and prior studies. FINDINGS: Upper limits normal heart size noted. Pulmonary vascular congestion and interstitial opacities are present likely representing interstitial edema. Increased opacity at the right lung base may represent atelectasis, focal edema or possibly superimposed pneumonia. A lytic lesion within the anterolateral left eighth rib is noted. IMPRESSION: 1. Pulmonary vascular congestion and probable interstitial pulmonary edema. 2. Right basilar opacity which may represent atelectasis, focal edema or possibly superimposed pneumonia. 3. Left eighth rib lytic lesion -question multiple myeloma or metastatic disease. Electronically Signed   By: Margarette Canada M.D.   On: 11/26/2017 11:08     Medications:   . sodium chloride    . ceFEPime (MAXIPIME) IV Stopped (11/25/17 1429)  . rasburicase (ELITEK) IV infusion    .  sodium bicarbonate  infusion 1000 mL 75 mL/hr at 11/26/17 0549  . sodium chloride    . [START ON 11/27/2017] vancomycin     . acyclovir  200 mg Oral Daily  . allopurinol  100 mg Oral Daily  . bortezomib SQ  1.3 mg/m2 Subcutaneous Once  . heparin  5,000 Units  Subcutaneous Q8H  . polyethylene glycol  17 g Oral Daily  . sodium bicarbonate  650 mg Oral TID   acetaminophen, heparin lock flush, heparin lock flush, HYDROmorphone (DILAUDID) injection, iopamidol, ondansetron (ZOFRAN) IV, oxyCODONE, sodium chloride flush, sodium chloride flush  Assessment/ Plan:    This is a 61 year old man with history of plasma cell leukemia s/p chemotherapy  Admitted with fever and shock that has improved    1. AKI  Complicated  Multifactorial  Related to shock. Light chains in urine  Tumor lysis and possible sepsis  It appears that he has good urine output and there is no indication for dialysis at this time   I think that the best thing to do would be to continue supportive therapy that would include IV fluids and treatment of electrolyte and acid base abdnormalities   He may improve and discussed that dialysis may be needed if the situation continues to worsen    2. Hyperkalemia   Low K diet and IV bicarbonate 3. Metabolic Acidosis IV bicarbonate 4, Vancomycin use would check levels prior to administering more IV Vanco   Will continue to follow   LOS: 3 Breslin Hemann W @TODAY @1 :17 PM

## 2017-11-26 NOTE — Progress Notes (Signed)
OT Cancellation Note  Patient Details Name: Andres Fernandez MRN: [id number] DOB: October 21, 1956   Cancelled Treatment:    Reason Eval/Treat Not Completed: Fatigue/lethargy limiting ability to participate   Pt had just gotten back to bed after being up all day Kari Baars, [place]  Payton Mccallum D 11/26/2017, 3:18 PM

## 2017-11-26 NOTE — Progress Notes (Signed)
[facility] PROGRESS NOTE  Andres Fernandez VQX:[id number] DOB: 02/15/1956 DOA: 11/23/2017  PCP: Patient, No Pcp Per  Brief History/Interval Summary: 61 year old male with recent hospitalization where he was found to have lytic lesions.  He also had an impending pathological fracture in the right femur requiring intramedullary nail placement by orthopedics.  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  Patient was hospitalized and started on chemotherapy.  Overnight on 12/15 patient developed a fever and tachycardia.  He was noted to be hypotensive.  He was transferred to stepdown unit.  Patient found to have elevated lactic acid level and pro calcitonin levels.  Started on broad-spectrum antibiotics.  Found to have worsening renal function today.  Nephrology to be consulted.  Reason for Visit: Plasma cell leukemia  Consultants: Oncology.  Nephrology.  Procedures:  Blood transfusion x 2  Antibiotics: Vancomycin and cefepime  Subjective/Interval History: Patient states that he is feeling better this morning.  He states that the pain in his right leg is reasonably well controlled.  He denies any nausea vomiting.  No chest pain.    ROS: Denies any shortness of breath.  Objective:  Vital Signs  Vitals:   11/26/17 0300 11/26/17 0307 11/26/17 0400 11/26/17 0600  BP: 98/83  125/78 136/85  Pulse: 100  100 89  Resp: (!) 27  (!) 26 (!) 26  Temp:  98.2 F (36.8 C)    TempSrc:  Oral    SpO2: 97%  97% (!) 81%  Weight:      Height:        Intake/Output Summary (Last 24 hours) at 11/26/2017 0800 Last data filed at 11/26/2017 0400 Gross per 24 hour  Intake 1556.25 ml  Output 1695 ml  Net -138.75 ml   Filed Weights   11/23/17 1443 11/25/17 0500  Weight: 65.3 kg (144 lb) 70.1 kg (154 lb 8.7 oz)    General appearance: Awake alert.  In no distress. Resp: Less tachypneic today compared to yesterday.  Few  crackles noted in the bases.  No wheezing.  No rhonchi. Cardio: S1-S2 is less tachycardic today compared to yesterday.  No rubs murmurs or bruits Chest wall: Tender to palpation over the right lower rib cage. GI: Abdomen is soft.  Not distended today compared to yesterday.  Bowel sounds are present.  No masses organomegaly Extremities: Dressing noted over the right lower extremity incision sites.  No erythema.  mild bruising is noted. Neurologic: Awake alert.  Mildly distracted.  No obvious focal neurological deficits.  Lab Results:  Data Reviewed: I have personally reviewed following labs and imaging studies  CBC: Recent Labs  Lab 11/22/17 0405 11/23/17 1800 11/24/17 0344 11/25/17 0352 11/26/17 0335  WBC 43.2* 55.3* 42.4* 15.4* 8.6  NEUTROABS 16.8* 13.3* 11.9* 9.9* 6.5  HGB 8.3* 8.1* 7.0* 8.1* 7.7*  HCT 23.1* 23.0* 20.2* 23.3* 22.6*  MCV 87.8 88.8 88.2 86.6 87.9  PLT 179 213 212 148* 126*    Basic Metabolic Panel: Recent Labs  Lab 11/20/17 0150 11/21/17 0340 11/22/17 0405 11/23/17 1800 11/24/17 0344 11/24/17 1420 11/25/17 0352 11/26/17 0335  NA 133* 134* 138 139 136 140 139 141  K 4.7 5.0 5.0 4.8 5.9* 4.4 4.5 5.6*  CL 107 109 113* 113* 111 111 113* 115*  CO2 18* 16* 16* 19* 19* 18* 17* 18*  GLUCOSE 137* 131* 119* 110* 139* 126* 117* 132*  BUN 51* 54* 54* 52* 53* 55* 49* 60*  CREATININE  4.31* 3.89* 3.65* 3.80* 3.87* 3.86* 3.80* 4.44*  CALCIUM 8.9 8.7* 8.5* 8.3* 8.0* 7.9* 7.4* 7.0*  PHOS 3.5 3.7 2.8  --   --   --   --   --     GFR: Estimated Creatinine Clearance: 17.1 mL/min (A) (by C-G formula based on SCr of 4.44 mg/dL (H)).  Liver Function Tests: Recent Labs  Lab 11/22/17 0405 11/23/17 1800 11/24/17 0344 11/25/17 0352 11/26/17 0335  AST  --  35 29 31 64*  ALT  --  _0 39  ALKPHOS  --  58 53 56 54  BILITOT  --  0.5 0.6 0.5 0.9  PROT  --  6.3* 5.9* 5.6* 5.3*  ALBUMIN 3.0* 3.2* 3.0* 2.7* 2.5*    Anemia Panel: Recent Labs    11/23/17 1800    RETICCTPCT 1.3    Recent Results (from the past 240 hour(s))  Surgical pcr screen     Status: None   Collection Time: 11/20/17  6:15 AM  Result Value Ref Range Status   MRSA, PCR NEGATIVE NEGATIVE Final   Staphylococcus aureus NEGATIVE NEGATIVE Final    Comment: (NOTE) The Xpert SA Assay (FDA approved for NASAL specimens in patients 18 years of age and older), is one component of a comprehensive surveillance program. It is not intended to diagnose infection nor to guide or monitor treatment.   MRSA PCR Screening     Status: None   Collection Time: 11/25/17  9:41 AM  Result Value Ref Range Status   MRSA by PCR NEGATIVE NEGATIVE Final    Comment:        The GeneXpert MRSA Assay (FDA approved for NASAL specimens only), is one component of a comprehensive MRSA colonization surveillance program. It is not intended to diagnose MRSA infection nor to guide or monitor treatment for MRSA infections.       Radiology Studies: Ct Abdomen Pelvis Wo Contrast  Result Date: 11/25/2017 CLINICAL DATA:  Fever of unknown origin EXAM: CT CHEST, ABDOMEN AND PELVIS WITHOUT CONTRAST TECHNIQUE: Multidetector CT imaging of the chest, abdomen and pelvis was performed following the standard protocol without IV contrast. COMPARISON:  Left lower extremity CT performed 11/17/2017. FINDINGS: CT CHEST FINDINGS Cardiovascular: Heart is borderline enlarged. Aorta is normal caliber with scattered aortic calcifications. Mediastinum/Nodes: No mediastinal, hilar, or axillary adenopathy. Lungs/Pleura: Severe centrilobular and paraseptal emphysema. Trace left pleural effusion. No confluent airspace opacities. Musculoskeletal: No acute bony abnormality. CT ABDOMEN PELVIS FINDINGS Hepatobiliary: Pericholecystic fluid present. No visible focal hepatic abnormality. Pancreas: No focal abnormality or ductal dilatation. Spleen: No focal abnormality. Normal size. Small calcification in the superior spleen. Adrenals/Urinary  Tract: No adrenal abnormality. No focal renal abnormality. No stones or hydronephrosis. Urinary bladder is unremarkable. Stomach/Bowel: Stomach, large and small bowel grossly unremarkable. Moderate stool burden in the colon. Vascular/Lymphatic: No evidence of aneurysm or adenopathy. Aortic and iliac calcifications. Reproductive: No visible focal abnormality. Other: No free fluid or free air. Musculoskeletal: Stranding noted within the subcutaneous soft tissues overlying the right lateral buttocks. There is heterogeneous material within the right gluteus muscles. These findings are likely related to recent hip surgery. Lytic destructive process noted within the left iliac bone on image 86 measuring up to 2.6 cm, unchanged since prior left lower extremity CT. IMPRESSION: Pericholecystic fluid noted. If there is clinical concern for cholecystitis, this could be further evaluated with ultrasound. Lytic destructive lesion within the left lateral iliac bone measuring up to 2.6 cm. It is most likely reflects an area  of multiple myeloma or metastasis. Intramuscular hematoma within the right gluteus muscles with subcutaneous stranding, compatible with recent surgery. Electronically Signed   By: Rolm Baptise M.D.   On: 11/25/2017 14:28   Ct Chest Wo Contrast  Result Date: 11/25/2017 CLINICAL DATA:  Fever of unknown origin EXAM: CT CHEST, ABDOMEN AND PELVIS WITHOUT CONTRAST TECHNIQUE: Multidetector CT imaging of the chest, abdomen and pelvis was performed following the standard protocol without IV contrast. COMPARISON:  Left lower extremity CT performed 11/17/2017. FINDINGS: CT CHEST FINDINGS Cardiovascular: Heart is borderline enlarged. Aorta is normal caliber with scattered aortic calcifications. Mediastinum/Nodes: No mediastinal, hilar, or axillary adenopathy. Lungs/Pleura: Severe centrilobular and paraseptal emphysema. Trace left pleural effusion. No confluent airspace opacities. Musculoskeletal: No acute bony  abnormality. CT ABDOMEN PELVIS FINDINGS Hepatobiliary: Pericholecystic fluid present. No visible focal hepatic abnormality. Pancreas: No focal abnormality or ductal dilatation. Spleen: No focal abnormality. Normal size. Small calcification in the superior spleen. Adrenals/Urinary Tract: No adrenal abnormality. No focal renal abnormality. No stones or hydronephrosis. Urinary bladder is unremarkable. Stomach/Bowel: Stomach, large and small bowel grossly unremarkable. Moderate stool burden in the colon. Vascular/Lymphatic: No evidence of aneurysm or adenopathy. Aortic and iliac calcifications. Reproductive: No visible focal abnormality. Other: No free fluid or free air. Musculoskeletal: Stranding noted within the subcutaneous soft tissues overlying the right lateral buttocks. There is heterogeneous material within the right gluteus muscles. These findings are likely related to recent hip surgery. Lytic destructive process noted within the left iliac bone on image 86 measuring up to 2.6 cm, unchanged since prior left lower extremity CT. IMPRESSION: Pericholecystic fluid noted. If there is clinical concern for cholecystitis, this could be further evaluated with ultrasound. Lytic destructive lesion within the left lateral iliac bone measuring up to 2.6 cm. It is most likely reflects an area of multiple myeloma or metastasis. Intramuscular hematoma within the right gluteus muscles with subcutaneous stranding, compatible with recent surgery. Electronically Signed   By: Rolm Baptise M.D.   On: 11/25/2017 14:28     Medications:  Scheduled: . acyclovir  200 mg Oral Daily  . allopurinol  100 mg Oral Daily  . bortezomib SQ  1.3 mg/m2 Subcutaneous Once  . heparin  5,000 Units Subcutaneous Q8H  . polyethylene glycol  17 g Oral Daily  . sodium bicarbonate  650 mg Oral TID  . sodium polystyrene  30 g Oral Once   Continuous: . sodium chloride    . ceFEPime (MAXIPIME) IV Stopped (11/25/17 1429)  .  sodium bicarbonate   infusion 1000 mL 75 mL/hr at 11/26/17 0549  . sodium chloride    . 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  Management per oncology.  Patient received bortezomib on 12/15.  He also received high-dose dexamethasone.  Patient appears to be experiencing some tumor lysis.  His uric acid level noted to be higher today compared to yesterday.  He is on allopurinol.  Further management per oncology.    Fever/sepsis Patient developed fever on 12/16.  He was tachycardic and mildly hypotensive.  Patient was transferred to the stepdown unit.  His pro-calcitonin and lactic acid levels were noted to be elevated.  Patient was started on broad-spectrum antibiotics with vancomycin and cefepime.  Cultures were sent.  No obvious source of infection has been found.  Few crackles heard in the lungs today.  We will repeat chest x-ray today.  He did undergo CT scan of the chest abdomen and pelvis which did not show any acute findings.  LFTs have been unremarkable.  He is not really tender in the right upper quadrant.  WBC is noted to be normal today could be due to the chemotherapy.  Acute renal failure versus acute renal failure on chronic kidney disease/Hyperkalemia/metabolic acidosis Most likely due to the above process.  Patient was seen by  nephrology during his recent hospitalization.  Patient's creatinine had been stable.  He has reasonable good urine output.  However creatinine noted to be higher today.  He also has hyperkalemia.  He will be given Kayexalate.  Continue bicarbonate infusion.  Discussed with Dr. Justin Mend with nephrology who will consult on this patient.    Recent hypercalcemia The patient was given bisphosphonate during recent hospitalization.  Calcium level has improved.  Continue to monitor closely.  Recent right femoral intramedullary nail placement This was for a lytic lesion with impending pathological fracture.  Continue pain medications.  PT and OT evaluation.  Incision sites appear to be clean.  Will need staple removal eventually.  Hematoma noted on the CT scan which is to be expected considering recent surgery.  Recently diagnosed essential hypertension Patient was started on low-dose Norvasc.  Antihypertensives on hold due to hypotension.     Normocytic anemia Most likely secondary to malignancy.  Could have been an element of acute blood loss as well since hematoma in RLE noted on the CT scan. He also was noted to have significant leukocytosis which is also due to his cancer.    Patient was transfused.  Hemoglobin did improve.  Slightly low today.  Continue to monitor for now.  Platelet counts are low but stable.[name]  History of tobacco abuse Patient has not smoked since he was last hospitalized.  Continue to emphasize smoking cessation.  Constipation Most likely secondary to pain medications.  He was started on MiraLAX.  Did have a bowel movement.  DVT Prophylaxis: Subcutaneous heparin    Code Status: Full code Family Communication: Discussed with the patient and his sister over the phone Disposition Plan: It has been as outlined above.  Nephrology has been consulted.    LOS: 3 days   [facility] Pager [phone] 11/26/2017, 8:00 AM  If 7PM-7AM, please contact  night-coverage at [email or web address], password [facility]

## 2017-11-27 ENCOUNTER — Telehealth: Payer: Self-pay

## 2017-11-27 ENCOUNTER — Inpatient Hospital Stay: Payer: BLUE CROSS/BLUE SHIELD | Admitting: Hematology

## 2017-11-27 ENCOUNTER — Other Ambulatory Visit: Payer: BLUE CROSS/BLUE SHIELD

## 2017-11-27 ENCOUNTER — Encounter (HOSPITAL_COMMUNITY): Payer: Self-pay | Admitting: Family Medicine

## 2017-11-27 DIAGNOSIS — B182 Chronic viral hepatitis C: Secondary | ICD-10-CM

## 2017-11-27 DIAGNOSIS — N179 Acute kidney failure, unspecified: Secondary | ICD-10-CM

## 2017-11-27 DIAGNOSIS — D696 Thrombocytopenia, unspecified: Secondary | ICD-10-CM

## 2017-11-27 DIAGNOSIS — T451X5A Adverse effect of antineoplastic and immunosuppressive drugs, initial encounter: Secondary | ICD-10-CM

## 2017-11-27 DIAGNOSIS — B1920 Unspecified viral hepatitis C without hepatic coma: Secondary | ICD-10-CM

## 2017-11-27 DIAGNOSIS — R6510 Systemic inflammatory response syndrome (SIRS) of non-infectious origin without acute organ dysfunction: Secondary | ICD-10-CM

## 2017-11-27 DIAGNOSIS — D6481 Anemia due to antineoplastic chemotherapy: Secondary | ICD-10-CM

## 2017-11-27 DIAGNOSIS — D649 Anemia, unspecified: Secondary | ICD-10-CM

## 2017-11-27 HISTORY — DX: Unspecified viral hepatitis C without hepatic coma: B19.20

## 2017-11-27 LAB — COMPREHENSIVE METABOLIC PANEL
ALK PHOS: 71 U/L (ref 38–126)
ALT: 69 U/L — AB (ref 17–63)
AST: 104 U/L — AB (ref 15–41)
Albumin: 2.5 g/dL — ABNORMAL LOW (ref 3.5–5.0)
Anion gap: 12 (ref 5–15)
BILIRUBIN TOTAL: 0.7 mg/dL (ref 0.3–1.2)
BUN: 71 mg/dL — AB (ref 6–20)
CALCIUM: 7.3 mg/dL — AB (ref 8.9–10.3)
CO2: 19 mmol/L — ABNORMAL LOW (ref 22–32)
CREATININE: 4.62 mg/dL — AB (ref 0.61–1.24)
Chloride: 107 mmol/L (ref 101–111)
GFR, EST AFRICAN AMERICAN: 15 mL/min — AB (ref >60)
GFR, EST NON AFRICAN AMERICAN: 13 mL/min — AB (ref >60)
Glucose, Bld: 109 mg/dL — ABNORMAL HIGH (ref 65–99)
Potassium: 4.6 mmol/L (ref 3.5–5.1)
Sodium: 138 mmol/L (ref 135–145)
TOTAL PROTEIN: 5.3 g/dL — AB (ref 6.5–8.1)

## 2017-11-27 LAB — BPAM RBC
BLOOD PRODUCT EXPIRATION DATE: 201901112359
BLOOD PRODUCT EXPIRATION DATE: 201901222359
Blood Product Expiration Date: 201901112359
ISSUE DATE / TIME: 201812151503
ISSUE DATE / TIME: 201812172346
ISSUE DATE / TIME: 201812151804
UNIT TYPE AND RH: 7300
Unit Type and Rh: 7300
Unit Type and Rh: 7300

## 2017-11-27 LAB — CBC WITH DIFFERENTIAL/PLATELET
BASOS PCT: 1 %
Basophils Absolute: 0.1 10*3/uL (ref 0.0–0.1)
EOS ABS: 0 10*3/uL (ref 0.0–0.7)
EOS PCT: 0 %
HEMATOCRIT: 25.9 % — AB (ref 39.0–52.0)
Hemoglobin: 9.1 g/dL — ABNORMAL LOW (ref 13.0–17.0)
LYMPHS PCT: 17 %
Lymphs Abs: 1.7 10*3/uL (ref 0.7–4.0)
MCH: 30.5 pg (ref 26.0–34.0)
MCHC: 35.1 g/dL (ref 30.0–36.0)
MCV: 86.9 fL (ref 78.0–100.0)
Monocytes Absolute: 3.1 10*3/uL — ABNORMAL HIGH (ref 0.1–1.0)
Monocytes Relative: 30 %
NEUTROS ABS: 5.3 10*3/uL (ref 1.7–7.7)
NEUTROS PCT: 52 %
PLATELETS: 95 10*3/uL — AB (ref 150–400)
RBC: 2.98 MIL/uL — ABNORMAL LOW (ref 4.22–5.81)
RDW: 16.1 % — ABNORMAL HIGH (ref 11.5–15.5)
WBC: 10.2 10*3/uL (ref 4.0–10.5)

## 2017-11-27 LAB — PROCALCITONIN: PROCALCITONIN: 3.97 ng/mL

## 2017-11-27 LAB — TYPE AND SCREEN
ABO/RH(D): B POS
ANTIBODY SCREEN: NEGATIVE
Unit division: 0
Unit division: 0
Unit division: 0

## 2017-11-27 LAB — APTT: APTT: 95 s — AB (ref 24–36)

## 2017-11-27 LAB — URIC ACID: URIC ACID, SERUM: 2.5 mg/dL — AB (ref 4.4–7.6)

## 2017-11-27 LAB — FIBRINOGEN: Fibrinogen: 326 mg/dL (ref 210–475)

## 2017-11-27 LAB — VANCOMYCIN, TROUGH: Vancomycin Tr: 12 ug/mL — ABNORMAL LOW (ref 15–20)

## 2017-11-27 MED ORDER — BORTEZOMIB CHEMO SQ INJECTION 3.5 MG (2.5MG/ML)
1.3000 mg/m2 | Freq: Once | INTRAMUSCULAR | Status: AC
  Administered 2017-11-27: 2.25 mg via SUBCUTANEOUS
  Filled 2017-11-27: qty 2.25

## 2017-11-27 MED ORDER — VANCOMYCIN HCL IN DEXTROSE 1-5 GM/200ML-% IV SOLN: 1000.0000 mg | Freq: Once | INTRAVENOUS | Status: DC

## 2017-11-27 NOTE — Progress Notes (Signed)
OT Cancellation Note  Patient Details Name: Andres Fernandez MRN: [id number] DOB: [birth year]   Cancelled Treatment:    Reason Eval/Treat Not Completed: Fatigue/lethargy limiting ability to participate.  Pt had chemo earlier and is fatiqued/wants to rest. Will check back tomorrow  Hubert Derstine 11/27/2017, 2:48 PM  Lesle Chris, OTR/L [phone] 11/27/2017

## 2017-11-27 NOTE — Telephone Encounter (Signed)
Received phone call from Graylin Shiver, [facility] from [facility] regarding pt velcade dosage today. Spoke with Dr. Irene Limbo, who reviewed lab work and would like to keep velcade at same dose 1.3 mg/kg today. Returned phone call to Ms. Green who verbalized understanding.

## 2017-11-27 NOTE — Progress Notes (Signed)
[facility] ROUNDING NOTE   Subjective:   61 year old male with recent hospitalization where he was found to have lytic lesions. He also had an impending pathological fracture in the right femur requiring intramedullary nail placement by orthopedics. 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 Patient was hospitalized and started on chemotherapy. Overnight on 12/15 patient developed a fever and tachycardia. He was noted to be hypotensive.  No complaints this morning  Great urine output - no abnormality in renal dimension and no hydronephrosis. Uric acid has improved  Increased to 8.4 now treated with allopurinol and uricase. Creatinine slightly increased this morning although patient appears better.    Objective:  Vital signs in last 24 hours:  Temp:  [98 F (36.7 C)-98.7 F (37.1 C)] 98.6 F (37 C) (12/18 0800) Pulse Rate:  [83-112] 83 (12/18 0800) Resp:  [17-32] 21 (12/18 0800) BP: (106-147)/(60-80) 113/64 (12/18 0800) SpO2:  [89 %-100 %] 100 % (12/18 0800)  Weight change:  Filed Weights   11/23/17 1443 11/25/17 0500  Weight: 144 lb (65.3 kg) 154 lb 8.7 oz (70.1 kg)    Intake/Output: I/O last 3 completed shifts: In: 2975 [P.O.:1035; I.V.:1575; Blood:315; IV Piggyback:50] Out: 4500 [Urine:4500]   Intake/Output this shift:  Total I/O In: 470 [P.O.:320; I.V.:150] Out: 300 [Urine:300]  CVS- RRR RS- CTA ABD- BS present soft non-distended EXT- no edema   Basic Metabolic Panel: Recent Labs  Lab 11/21/17 0340 11/22/17 0405  11/24/17 0344 11/24/17 1420 11/25/17 0352 11/26/17 0335 11/27/17 0701  NA 134* 138   < > 136 140 139 141 138  K 5.0 5.0   < > 5.9* 4.4 4.5 5.6* 4.6  CL 109 113*   < > 111 111 113* 115* 107  CO2 16* 16*   < > 19* 18* 17* 18* 19*  GLUCOSE 131* 119*   < > 139* 126* 117* 132* 109*  BUN 54* 54*   < > 53* 55* 49* 60* 71*  CREATININE 3.89* 3.65*   < > 3.87* 3.86*  3.80* 4.44* 4.62*  CALCIUM 8.7* 8.5*   < > 8.0* 7.9* 7.4* 7.0* 7.3*  PHOS 3.7 2.8  --   --   --   --   --   --    < > = values in this interval not displayed.    Liver Function Tests: Recent Labs  Lab 11/23/17 1800 11/24/17 0344 11/25/17 0352 11/26/17 0335 11/27/17 0701  AST 35 29 31 64* 104*  ALT _0 39 69*  ALKPHOS 58 53 56 54 71  BILITOT 0.5 0.6 0.5 0.9 0.7  PROT 6.3* 5.9* 5.6* 5.3* 5.3*  ALBUMIN 3.2* 3.0* 2.7* 2.5* 2.5*   No results for input(s): LIPASE, AMYLASE in the last 168 hours. No results for input(s): AMMONIA in the last 168 hours.  CBC: Recent Labs  Lab 11/23/17 1800 11/24/17 0344 11/25/17 0352 11/26/17 0335 11/27/17 0701  WBC 55.3* 42.4* 15.4* 8.6 10.2  NEUTROABS 13.3* 11.9* 9.9* 6.5 5.3  HGB 8.1* 7.0* 8.1* 7.7* 9.1*  HCT 23.0* 20.2* 23.3* 22.6* 25.9*  MCV 88.8 88.2 86.6 87.9 86.9  PLT 213 212 148* 126* 95*    Cardiac Enzymes: No results for input(s): CKTOTAL, CKMB, CKMBINDEX, TROPONINI in the last 168 hours.  BNP: Invalid input(s): POCBNP  CBG: No results for input(s): GLUCAP in the last 168 hours.  Microbiology: Results for orders placed or performed during the hospital encounter of 11/23/17  Culture,  blood (Routine X 2) w Reflex to ID Panel     Status: None (Preliminary result)   Collection Time: 11/25/17  8:13 AM  Result Value Ref Range Status   Specimen Description BLOOD LEFT ANTECUBITAL  Final   Special Requests   Final    BOTTLES DRAWN AEROBIC AND ANAEROBIC Blood Culture adequate volume   Culture   Final    NO GROWTH 1 DAY Performed at [facility], [place] [address], [place], [place] [address]    Report Status PENDING  Incomplete  Culture, blood (Routine X 2) w Reflex to ID Panel     Status: None (Preliminary result)   Collection Time: 11/25/17  8:18 AM  Result Value Ref Range Status   Specimen Description BLOOD BLOOD LEFT FOREARM  Final   Special Requests   Final    BOTTLES DRAWN AEROBIC AND ANAEROBIC Blood Culture  adequate volume   Culture   Final    NO GROWTH 1 DAY Performed at [facility], [place] [address], [place], [place] [address]    Report Status PENDING  Incomplete  MRSA PCR Screening     Status: None   Collection Time: 11/25/17  9:41 AM  Result Value Ref Range Status   MRSA by PCR NEGATIVE NEGATIVE Final    Comment:        The GeneXpert MRSA Assay (FDA approved for NASAL specimens only), is one component of a comprehensive MRSA colonization surveillance program. It is not intended to diagnose MRSA infection nor to guide or monitor treatment for MRSA infections.   Urine Culture     Status: Abnormal   Collection Time: 11/25/17  4:05 PM  Result Value Ref Range Status   Specimen Description URINE, RANDOM  Final   Special Requests NONE  Final   Culture (A)  Final    <10,000 COLONIES/mL INSIGNIFICANT GROWTH Performed at [facility], [address], [place], [place] [address]    Report Status 11/26/2017 FINAL  Final    Coagulation Studies: Recent Labs    11/26/17 1229  LABPROT 15.9*  INR 1.28    Urinalysis: Recent Labs    11/25/17 1605  COLORURINE YELLOW  LABSPEC 1.006  PHURINE 5.0  GLUCOSEU NEGATIVE  HGBUR MODERATE*  BILIRUBINUR NEGATIVE  KETONESUR NEGATIVE  PROTEINUR NEGATIVE  NITRITE NEGATIVE  LEUKOCYTESUR NEGATIVE      Imaging: Ct Abdomen Pelvis Wo Contrast  Result Date: 11/25/2017 CLINICAL DATA:  Fever of unknown origin EXAM: CT CHEST, ABDOMEN AND PELVIS WITHOUT CONTRAST TECHNIQUE: Multidetector CT imaging of the chest, abdomen and pelvis was performed following the standard protocol without IV contrast. COMPARISON:  Left lower extremity CT performed 11/17/2017. FINDINGS: CT CHEST FINDINGS Cardiovascular: Heart is borderline enlarged. Aorta is normal caliber with scattered aortic calcifications. Mediastinum/Nodes: No mediastinal, hilar, or axillary adenopathy. Lungs/Pleura: Severe centrilobular and paraseptal emphysema. Trace left pleural  effusion. No confluent airspace opacities. Musculoskeletal: No acute bony abnormality. CT ABDOMEN PELVIS FINDINGS Hepatobiliary: Pericholecystic fluid present. No visible focal hepatic abnormality. Pancreas: No focal abnormality or ductal dilatation. Spleen: No focal abnormality. Normal size. Small calcification in the superior spleen. Adrenals/Urinary Tract: No adrenal abnormality. No focal renal abnormality. No stones or hydronephrosis. Urinary bladder is unremarkable. Stomach/Bowel: Stomach, large and small bowel grossly unremarkable. Moderate stool burden in the colon. Vascular/Lymphatic: No evidence of aneurysm or adenopathy. Aortic and iliac calcifications. Reproductive: No visible focal abnormality. Other: No free fluid or free air. Musculoskeletal: Stranding noted within the subcutaneous soft tissues overlying the  right lateral buttocks. There is heterogeneous material within the right gluteus muscles. These findings are likely related to recent hip surgery. Lytic destructive process noted within the left iliac bone on image 86 measuring up to 2.6 cm, unchanged since prior left lower extremity CT. IMPRESSION: Pericholecystic fluid noted. If there is clinical concern for cholecystitis, this could be further evaluated with ultrasound. Lytic destructive lesion within the left lateral iliac bone measuring up to 2.6 cm. It is most likely reflects an area of multiple myeloma or metastasis. Intramuscular hematoma within the right gluteus muscles with subcutaneous stranding, compatible with recent surgery. Electronically Signed   By: Rolm Baptise M.D.   On: 11/25/2017 14:28   Ct Chest Wo Contrast  Result Date: 11/25/2017 CLINICAL DATA:  Fever of unknown origin EXAM: CT CHEST, ABDOMEN AND PELVIS WITHOUT CONTRAST TECHNIQUE: Multidetector CT imaging of the chest, abdomen and pelvis was performed following the standard protocol without IV contrast. COMPARISON:  Left lower extremity CT performed 11/17/2017.  FINDINGS: CT CHEST FINDINGS Cardiovascular: Heart is borderline enlarged. Aorta is normal caliber with scattered aortic calcifications. Mediastinum/Nodes: No mediastinal, hilar, or axillary adenopathy. Lungs/Pleura: Severe centrilobular and paraseptal emphysema. Trace left pleural effusion. No confluent airspace opacities. Musculoskeletal: No acute bony abnormality. CT ABDOMEN PELVIS FINDINGS Hepatobiliary: Pericholecystic fluid present. No visible focal hepatic abnormality. Pancreas: No focal abnormality or ductal dilatation. Spleen: No focal abnormality. Normal size. Small calcification in the superior spleen. Adrenals/Urinary Tract: No adrenal abnormality. No focal renal abnormality. No stones or hydronephrosis. Urinary bladder is unremarkable. Stomach/Bowel: Stomach, large and small bowel grossly unremarkable. Moderate stool burden in the colon. Vascular/Lymphatic: No evidence of aneurysm or adenopathy. Aortic and iliac calcifications. Reproductive: No visible focal abnormality. Other: No free fluid or free air. Musculoskeletal: Stranding noted within the subcutaneous soft tissues overlying the right lateral buttocks. There is heterogeneous material within the right gluteus muscles. These findings are likely related to recent hip surgery. Lytic destructive process noted within the left iliac bone on image 86 measuring up to 2.6 cm, unchanged since prior left lower extremity CT. IMPRESSION: Pericholecystic fluid noted. If there is clinical concern for cholecystitis, this could be further evaluated with ultrasound. Lytic destructive lesion within the left lateral iliac bone measuring up to 2.6 cm. It is most likely reflects an area of multiple myeloma or metastasis. Intramuscular hematoma within the right gluteus muscles with subcutaneous stranding, compatible with recent surgery. Electronically Signed   By: Rolm Baptise M.D.   On: 11/25/2017 14:28   Dg Chest Port 1 View  Result Date: 11/26/2017 CLINICAL  DATA:  61 year old male with shortness of breath and bibasilar crackles EXAM: PORTABLE CHEST 1 VIEW COMPARISON:  11/25/2017 CT, 11/17/2017 chest radiograph and prior studies. FINDINGS: Upper limits normal heart size noted. Pulmonary vascular congestion and interstitial opacities are present likely representing interstitial edema. Increased opacity at the right lung base may represent atelectasis, focal edema or possibly superimposed pneumonia. A lytic lesion within the anterolateral left eighth rib is noted. IMPRESSION: 1. Pulmonary vascular congestion and probable interstitial pulmonary edema. 2. Right basilar opacity which may represent atelectasis, focal edema or possibly superimposed pneumonia. 3. Left eighth rib lytic lesion -question multiple myeloma or metastatic disease. Electronically Signed   By: Margarette Canada M.D.   On: 11/26/2017 11:08     Medications:   . sodium chloride    . ceFEPime (MAXIPIME) IV Stopped (11/26/17 1430)  .  sodium bicarbonate  infusion 1000 mL 75 mL/hr at 11/27/17 0800  . sodium chloride    .  vancomycin     . acyclovir  200 mg Oral Daily  . allopurinol  100 mg Oral Daily  . bortezomib SQ  1.3 mg/m2 Subcutaneous Once  . heparin  5,000 Units Subcutaneous Q8H  . polyethylene glycol  17 g Oral Daily  . sodium bicarbonate  650 mg Oral TID   acetaminophen, heparin lock flush, heparin lock flush, heparin lock flush, heparin lock flush, HYDROmorphone (DILAUDID) injection, iopamidol, ondansetron (ZOFRAN) IV, oxyCODONE, sodium chloride flush, sodium chloride flush, sodium chloride flush, sodium chloride flush  Assessment/ Plan:  This is a 61 year old man with history of plasma cell leukemia s/p chemotherapy  Admitted with fever and shock that has improved    1. AKI  Complicated  Multifactorial  Related to shock. Light chains in urine  Tumor lysis and possible sepsis  It appears that he has good urine output and there is no indication for dialysis at this time   I think  that the best thing to do would be to continue supportive therapy that would include IV fluids and treatment of electrolyte and acid base abdnormalities hemodynamics appear improved at this time   2. Hyperkalemia   Low K diet and IV bicarbonate 3. Metabolic Acidosis IV bicarbonate 4, Vancomycin use would check levels prior to administering more IV Vanco  Patient appears to be a little better will continue to follow      LOS: 4 [place] W _0 _1 :06 AM

## 2017-11-27 NOTE — Progress Notes (Signed)
Dose and dilution for Velcade verified with Clotilde Dieter, RN.

## 2017-11-27 NOTE — Progress Notes (Signed)
Date: November 27, 2017 Andres Fernandez, BSN, [place], [place] Chart and notes review for patient progress and needs. Will follow for case management and discharge needs. Next review date: [id number]

## 2017-11-27 NOTE — Progress Notes (Signed)
Pharmacy Antibiotic Note  Andres Fernandez is a 61 y.o. male admitted on 11/23/2017 with newly diagnosed plasma cell leukemia; Velcade given on 12/15; he developed fever, tachycardia and hypotension with concern for sepsis on 12/16.  Pharmacy has been consulted for Vancomycin and Cefepime dosing.  Today, 11/27/2017: Afebrile SCr continues to increase, 4.62, with CrCl ~ 16 ml/min WBC 10.2, ANC 5.3 (steroids, velcade) LA: 2 > 2.2 > 1 PCT 3.42 > 5.39 > 3.97  Plan:  Cefepime 2g IV q24h  Vancomycin 1000 mg IV x1 dose  Measure Vanc levels as needed prior to next doses  Follow up renal fxn, culture results, and clinical course.   Height: 5\' 8"  (172.7 cm) Weight: 154 lb 8.7 oz (70.1 kg) IBW/kg (Calculated) : 68.4  Temp (24hrs), Avg:98.5 F (36.9 C), Min:98 F (36.7 C), Max:98.7 F (37.1 C)  Recent Labs  Lab 11/23/17 1800 11/24/17 0344 11/24/17 1420 11/25/17 0352 11/25/17 0813 11/25/17 1120 11/26/17 0335 11/27/17 0701  WBC 55.3* 42.4*  --  15.4*  --   --  8.6 10.2  CREATININE 3.80* 3.87* 3.86* 3.80*  --   --  4.44* 4.62*  LATICACIDVEN  --   --   --   --  2.0* 2.2* 1.0  --   VANCOTROUGH  --   --   --   --   --   --   --  12*    Estimated Creatinine Clearance: 16.5 mL/min (A) (by C-G formula based on SCr of 4.62 mg/dL (H)).    No Known Allergies  Antimicrobials this admission: 12/14 acyclovir >> 12/16 vanco >> 12/16 cefepime >>  Dose adjustments this admission: 12/18 0701 VT = 12  Microbiology results: 12/16 BCx: ngtd 12/16 UCx:  <10k colonies, insignificant growth 12/16 MRSA PCR: neg  Thank you for allowing pharmacy to be a part of this patient's care.  Gretta Arab PharmD, BCPS Pager [phone] 11/27/2017 9:17 AM

## 2017-11-27 NOTE — Progress Notes (Signed)
PROGRESS NOTE  ION GONNELLA FUX:[id number] DOB: Jul 22, 1956 DOA: 11/23/2017 PCP: Patient, No Pcp Per  Brief Narrative: 29yom discharged 12/13, workup for suspected multiple myeloma with BM biopsy pending on discharge; 12/14 BM biopsy results available, oncology requested direct admit for aggressive inpt tx. Chemotherapy initiated 12/15. 12/16 decompensated with fever, tachycardia, hypotension; started on abx. Treated for TLS. Nephrology consulted for worsening creatinine.  Assessment/Plan SIRS. Fever, hypotension, tachycardia. CT abd/pelvis with pericholecystic fluid. LFTs unremarkable. Lactic back to normal .Procalcitonin high. CXR right base atelectasis vs pneumonia but no infiltrate on chest CT - no evidence of infection. Hemodynamics stable, no fever; no evidence of sepsis. BC NG 1 day - no abd symptoms or RUQ pain. No further eval for pericholecystic fluid now. Transaminases likely up from hepatitis C. - d/c vanc. MRSA PCR negative; no reason to suspect MRSA. 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 -management per oncology. Chemotherapy initiated 12/15. Sp Rasburicase 12/17. Continue allopurinol. - daily uric acid levels (down to 2.5 today) -next dose Velcade today per oncology   AKI, associated hyperkalemia and metabolic acidosis. complicated, multifactorial, hypotension, malignancy, TLS. - creatinine 4.44 >> 4.62, BUN up, good UOP and hyperkalemia resolved. - nephrology managing with supportive care, IVF.  Anemia of malignancy. - s/p 2 units PRBC 12/15, 1 unit 12/17. - Hgb rise appropriate - follow CBC. Stop heparin.   Thrombocytopenia - new over last 72 hours. Secondary to chemotherapy? Management per oncology. - stop heparin. CBC in AM.  Hepatitis C, new dx. - f/u with GI as an outpt  Elevated transaminases - isolated, CT liver appearance unremarkable. Likely secondary to hepatitis C. Follow  clinically.  Pathologic right femur fx s/p intramedullary nail 12/11 - WBAT per ortho. F/u end ofyear for suture removal. 6 weeks day ASA 325 for DVT proph per ortho.  Essential HTN. - hold amlodipine.   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 Transfer to medical floor. Narrow abx. Further plans as above. Home when cell counts, renal fxn stable  DVT prophylaxis: place SCDs Code Status: full Family Communication: none Disposition Plan: pending    Murray Hodgkins, MD  [facility] Direct contact: [phone] --[place]ia [place]  --[email or web address]; password [email or web address]  7PM-7AM contact night coverage as above 11/27/2017, 10:30 AM  LOS: 4 days   Consultants:  Oncology   Procedures: 2 units PRBC 12/15 1 unit PRBC 12/17  Antimicrobials:  Cefepime 12/17 >>  Vancomycin 12/17 >>  Interval history/Subjective: Feels ok. Pain right hip, otherwise no complaints. Eating fine. Breathing fine. Some cough with phlegm. No abd pain.  Objective: Vitals:  Vitals:   11/27/17 0400 11/27/17 0800  BP: 119/77 113/64  Pulse: (!) 101 83  Resp: (!) 25 (!) 21  Temp: 98.6 F (37 C) 98.6 F (37 C)  SpO2: 95% 100%    Exam:  Constitutional:  . Appears calm and comfortable lying in bed Eyes:  . pupils and irises appear normal . Normal lids  ENMT:  . grossly normal hearing  . Lips appear normal . Oropharynx:  Tongue appears normal Respiratory:  . CTA bilaterally, no w/r/r.  . Respiratory effort normal.  Cardiovascular:  . RRR, no m/r/g . No LE extremity edema   Abdomen:  . Soft, ntnd, no RUQ pain Musculoskeletal:  . Digits/nails BUE: no clubbing, cyanosis, petechiae, infection . RUE, LUE, RLE,  LLE   o strength and tone normal, no atrophy, no abnormal movements Skin:  . No rashes, lesions, ulcers . palpation of skin: no induration or nodules Psychiatric:  . Mental  status o Mood, affect appropriate  I have personally reviewed the following:  Filed Weights   11/23/17 1443 11/25/17 0500  Weight: 65.3 kg (144 lb) 70.1 kg (154 lb 8.7 oz)   Weight change:   UOP: 3600 I/O since admission: -950 Last BM charted: 12/16 Foley: no Telemetry: SR Status: SDU  Labs:  BMP noted, comment aove  LFTs noted  CBC noted  Imaging studies:  CXR independently reviewed right base opacity  Review and summation of old records:  Discharge 12/13. AKI, hypercalcemia, skeletal lytic lesions. Treated with Decadron and pamidronate.  Echo 11/18/2017 Study Conclusions  - Left ventricle: The cavity size was normal. Wall thickness was   increased in a pattern of mild LVH. Systolic function was normal.   The estimated ejection fraction was in the range of 60% to 65%.   Wall motion was normal; there were no regional wall motion   abnormalities. Doppler parameters are consistent with abnormal   left ventricular relaxation (grade 1 diastolic dysfunction).  Scheduled Meds: . acyclovir  200 mg Oral Daily  . allopurinol  100 mg Oral Daily  . bortezomib SQ  1.3 mg/m2 Subcutaneous Once  . polyethylene glycol  17 g Oral Daily  . sodium bicarbonate  650 mg Oral TID   Continuous Infusions: . sodium chloride    . ceFEPime (MAXIPIME) IV Stopped (11/26/17 1430)  .  sodium bicarbonate  infusion 1000 mL 75 mL/hr at 11/27/17 0800  . sodium chloride      Principal Problem:   Plasma cell leukemia (HCC) Active Problems:   Tobacco abuse   Pathological fracture in neoplastic disease, right femur, initial encounter for fracture (HCC)   Tumor lysis syndrome   Encounter for antineoplastic chemotherapy   Cancer associated pain   AKI (acute kidney injury) ([place])   SIRS (systemic inflammatory response syndrome) (HCC)   Anemia   Thrombocytopenia (HCC)   Hepatitis C   LOS: 4 days

## 2017-11-28 DIAGNOSIS — D696 Thrombocytopenia, unspecified: Secondary | ICD-10-CM

## 2017-11-28 LAB — COMPREHENSIVE METABOLIC PANEL
ALK PHOS: 69 U/L (ref 38–126)
ALT: 56 U/L (ref 17–63)
ANION GAP: 11 (ref 5–15)
AST: 97 U/L — ABNORMAL HIGH (ref 15–41)
Albumin: 2.3 g/dL — ABNORMAL LOW (ref 3.5–5.0)
BILIRUBIN TOTAL: 0.5 mg/dL (ref 0.3–1.2)
BUN: 82 mg/dL — ABNORMAL HIGH (ref 6–20)
CALCIUM: 7.1 mg/dL — AB (ref 8.9–10.3)
CO2: 23 mmol/L (ref 22–32)
Chloride: 107 mmol/L (ref 101–111)
Creatinine, Ser: 5.12 mg/dL — ABNORMAL HIGH (ref 0.61–1.24)
GFR calc non Af Amer: 11 mL/min — ABNORMAL LOW (ref >60)
GFR, EST AFRICAN AMERICAN: 13 mL/min — AB (ref >60)
Glucose, Bld: 127 mg/dL — ABNORMAL HIGH (ref 65–99)
Potassium: 4.1 mmol/L (ref 3.5–5.1)
Sodium: 141 mmol/L (ref 135–145)
TOTAL PROTEIN: 5 g/dL — AB (ref 6.5–8.1)

## 2017-11-28 LAB — CBC
HEMATOCRIT: 26 % — AB (ref 39.0–52.0)
HEMOGLOBIN: 8.8 g/dL — AB (ref 13.0–17.0)
MCH: 29.9 pg (ref 26.0–34.0)
MCHC: 33.8 g/dL (ref 30.0–36.0)
MCV: 88.4 fL (ref 78.0–100.0)
Platelets: 98 10*3/uL — ABNORMAL LOW (ref 150–400)
RBC: 2.94 MIL/uL — ABNORMAL LOW (ref 4.22–5.81)
RDW: 15.8 % — ABNORMAL HIGH (ref 11.5–15.5)
WBC: 11 10*3/uL — ABNORMAL HIGH (ref 4.0–10.5)

## 2017-11-28 LAB — URIC ACID: URIC ACID, SERUM: 1.1 mg/dL — AB (ref 4.4–7.6)

## 2017-11-28 LAB — VITAMIN D 25 HYDROXY (VIT D DEFICIENCY, FRACTURES): Vit D, 25-Hydroxy: 48.6 ng/mL (ref 30.0–100.0)

## 2017-11-28 MED ORDER — LIP MEDEX EX OINT
TOPICAL_OINTMENT | CUTANEOUS | Status: DC | PRN
  Filled 2017-11-28: qty 7

## 2017-11-28 NOTE — Progress Notes (Signed)
[facility] ROUNDING NOTE   Subjective:   61 year old male with recent hospitalization where he was found to have lytic lesions. He also had an impending pathological fracture in the right femur requiring intramedullary nail placement by orthopedics. 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 Patient was hospitalized and started on chemotherapy. Overnight on 12/15 patient developed a fever and tachycardia. He was noted to be hypotensive.  No complaints this morning  Great urine output - no abnormality in renal dimension and no hydronephrosis. Uric acid has improvednow treated with allopurinol and uricase. Creatinine slightly increased this morning although patient appears better.    Objective:  Vital signs in last 24 hours:  Temp:  [98 F (36.7 C)-98.5 F (36.9 C)] 98.5 F (36.9 C) (12/19 0830) Pulse Rate:  [82-101] 99 (12/19 0800) Resp:  [18-34] 29 (12/19 0800) BP: (88-144)/(35-62) 95/45 (12/19 0800) SpO2:  [90 %-100 %] 93 % (12/19 0800)  Weight change:  Filed Weights   11/23/17 1443 11/25/17 0500  Weight: 144 lb (65.3 kg) 154 lb 8.7 oz (70.1 kg)    Intake/Output: I/O last 3 completed shifts: In: 5784 [P.O.:1265; I.V.:2550; Blood:315; IV Piggyback:50] Out: 6962 [Urine:3550]   Intake/Output this shift:  No intake/output data recorded.  CVS- RRR RS- CTA ABD- BS present soft non-distended EXT- no edema   Basic Metabolic Panel: Recent Labs  Lab 11/22/17 0405  11/24/17 1420 11/25/17 0352 11/26/17 0335 11/27/17 0701 11/28/17 0329  NA 138   < > 140 139 141 138 141  K 5.0   < > 4.4 4.5 5.6* 4.6 4.1  CL 113*   < > 111 113* 115* 107 107  CO2 16*   < > 18* 17* 18* 19* 23  GLUCOSE 119*   < > 126* 117* 132* 109* 127*  BUN 54*   < > 55* 49* 60* 71* 82*  CREATININE 3.65*   < > 3.86* 3.80* 4.44* 4.62* 5.12*  CALCIUM 8.5*   < > 7.9* 7.4* 7.0* 7.3* 7.1*  PHOS 2.8  --   --   --   --   --    --    < > = values in this interval not displayed.    Liver Function Tests: Recent Labs  Lab 11/24/17 0344 11/25/17 0352 11/26/17 0335 11/27/17 0701 11/28/17 0329  AST 29 31 64* 104* 97*  ALT 25 27 39 69* 56  ALKPHOS 53 56 54 71 69  BILITOT 0.6 0.5 0.9 0.7 0.5  PROT 5.9* 5.6* 5.3* 5.3* 5.0*  ALBUMIN 3.0* 2.7* 2.5* 2.5* 2.3*   No results for input(s): LIPASE, AMYLASE in the last 168 hours. No results for input(s): AMMONIA in the last 168 hours.  CBC: Recent Labs  Lab 11/23/17 1800 11/24/17 0344 11/25/17 0352 11/26/17 0335 11/27/17 0701 11/28/17 0329  WBC 55.3* 42.4* 15.4* 8.6 10.2 11.0*  NEUTROABS 13.3* 11.9* 9.9* 6.5 5.3  --   HGB 8.1* 7.0* 8.1* 7.7* 9.1* 8.8*  HCT 23.0* 20.2* 23.3* 22.6* 25.9* 26.0*  MCV 88.8 88.2 86.6 87.9 86.9 88.4  PLT 213 212 148* 126* 95* 98*    Cardiac Enzymes: No results for input(s): CKTOTAL, CKMB, CKMBINDEX, TROPONINI in the last 168 hours.  BNP: Invalid input(s): POCBNP  CBG: No results for input(s): GLUCAP in the last 168 hours.  Microbiology: Results for orders placed or performed during the hospital encounter of 11/23/17  Culture, blood (Routine X 2) w Reflex to ID Panel  Status: None (Preliminary result)   Collection Time: 11/25/17  8:13 AM  Result Value Ref Range Status   Specimen Description BLOOD LEFT ANTECUBITAL  Final   Special Requests   Final    BOTTLES DRAWN AEROBIC AND ANAEROBIC Blood Culture adequate volume   Culture   Final    NO GROWTH 2 DAYS Performed at [facility], [address] [place], [place] [address]    Report Status PENDING  Incomplete  Culture, blood (Routine X 2) w Reflex to ID Panel     Status: None (Preliminary result)   Collection Time: 11/25/17  8:18 AM  Result Value Ref Range Status   Specimen Description BLOOD BLOOD LEFT FOREARM  Final   Special Requests   Final    BOTTLES DRAWN AEROBIC AND ANAEROBIC Blood Culture adequate volume   Culture   Final    NO GROWTH 2 DAYS Performed  at [facility], [place] [address], [place], [place] [address]    Report Status PENDING  Incomplete  MRSA PCR Screening     Status: None   Collection Time: 11/25/17  9:41 AM  Result Value Ref Range Status   MRSA by PCR NEGATIVE NEGATIVE Final    Comment:        The GeneXpert MRSA Assay (FDA approved for NASAL specimens only), is one component of a comprehensive MRSA colonization surveillance program. It is not intended to diagnose MRSA infection nor to guide or monitor treatment for MRSA infections.   Urine Culture     Status: Abnormal   Collection Time: 11/25/17  4:05 PM  Result Value Ref Range Status   Specimen Description URINE, RANDOM  Final   Special Requests NONE  Final   Culture (A)  Final    <10,000 COLONIES/mL INSIGNIFICANT GROWTH Performed at [facility], [address] [place], [place] [address]    Report Status 11/26/2017 FINAL  Final    Coagulation Studies: Recent Labs    11/26/17 1229  LABPROT 15.9*  INR 1.28    Urinalysis: Recent Labs    11/25/17 1605  COLORURINE YELLOW  LABSPEC 1.006  PHURINE 5.0  GLUCOSEU NEGATIVE  HGBUR MODERATE*  BILIRUBINUR NEGATIVE  KETONESUR NEGATIVE  PROTEINUR NEGATIVE  NITRITE NEGATIVE  LEUKOCYTESUR NEGATIVE      Imaging: Dg Chest Port 1 View  Result Date: 11/26/2017 CLINICAL DATA:  61 year old male with shortness of breath and bibasilar crackles EXAM: PORTABLE CHEST 1 VIEW COMPARISON:  11/25/2017 CT, 11/17/2017 chest radiograph and prior studies. FINDINGS: Upper limits normal heart size noted. Pulmonary vascular congestion and interstitial opacities are present likely representing interstitial edema. Increased opacity at the right lung base may represent atelectasis, focal edema or possibly superimposed pneumonia. A lytic lesion within the anterolateral left eighth rib is noted. IMPRESSION: 1. Pulmonary vascular congestion and probable interstitial pulmonary edema. 2. Right basilar opacity which may  represent atelectasis, focal edema or possibly superimposed pneumonia. 3. Left eighth rib lytic lesion -question multiple myeloma or metastatic disease. Electronically Signed   By: Margarette Canada M.D.   On: 11/26/2017 11:08     Medications:   . sodium chloride    .  sodium bicarbonate  infusion 1000 mL 75 mL/hr at 11/28/17 0221  . sodium chloride     . acyclovir  200 mg Oral Daily  . allopurinol  100 mg Oral Daily  . polyethylene glycol  17 g Oral Daily  . sodium bicarbonate  650 mg Oral TID   acetaminophen, heparin  lock flush, heparin lock flush, heparin lock flush, heparin lock flush, HYDROmorphone (DILAUDID) injection, iopamidol, ondansetron (ZOFRAN) IV, oxyCODONE, sodium chloride flush, sodium chloride flush, sodium chloride flush, sodium chloride flush  Assessment/ Plan:  This is a 61 year old man with history of plasma cell leukemia s/p chemotherapy Admitted with fever and shock that has improved   1. AKI Complicated Multifactorial Related to shock. Light chains in urine Tumor lysis and possible sepsis It appears that he has good urine output and there is no indication for dialysis at this time I think that the best thing to do would be to continue supportive therapy that would include IV fluids and treatment of electrolyte and acid base abdnormalities hemodynamics appear improved at this time  2. Hyperkalemia improved 3. Metabolic Acidosis improved will stop  4, Vancomycin use would check levels prior to administering more IV Vanco  Patient appears to be a little better will continue to follow        LOS: 5 Juanell Saffo W _0 _1 :37 AM

## 2017-11-28 NOTE — Progress Notes (Signed)
PT Cancellation Note  Patient Details Name: Andres Fernandez MRN: [id number] DOB: [birth year]   Cancelled Treatment:    Reason Eval/Treat Not Completed: Medical issues which prohibited therapy--pt having a nose bleed. RN in with pt. Will check back another time.    Weston Anna, MPT Pager: [phone]

## 2017-11-28 NOTE — Progress Notes (Signed)
PT Cancellation Note  Patient Details Name: Andres Fernandez MRN: [id number] DOB: 1956-08-18   Cancelled Treatment:    Reason Eval/Treat Not Completed: Patient declined, no reason specified   Weston Anna, MPT Pager: [phone]

## 2017-11-28 NOTE — Plan of Care (Signed)
  Medication: Risk for medication side effects will decrease 11/28/2017 1215 - Progressing by Dorene Sorrow, RN   Nutritional: Maintenance of adequate weight for body size and type will improve 11/28/2017 1215 - Progressing by Dorene Sorrow, RN   Activity: Expression of feelings of increased energy will increase 11/28/2017 1215 - Progressing by Dorene Sorrow, RN

## 2017-11-28 NOTE — Progress Notes (Signed)
11/28/17  1000 Notified Dr Sarajane Jews of patient nose bleed from the right nare. Bleeding has stopped. Will continue to monitor.

## 2017-11-28 NOTE — Progress Notes (Signed)
OT Cancellation Note  Patient Details Name: Andres Fernandez MRN: [id number] DOB: [birth year]   Cancelled Treatment:    Reason Eval/Treat Not Completed: Fatigue/lethargy limiting ability to participate.  Pt requested OT try back in the morning hours if possible  Kesean Serviss 11/28/2017, 2:56 PM  Lesle Chris, OTR/L [phone] 11/28/2017

## 2017-11-28 NOTE — Progress Notes (Signed)
PROGRESS NOTE  Andres Fernandez MRN:[id number] DOB: 02/17/1956 DOA: 11/23/2017 PCP: Patient, No Pcp Per  Brief Narrative: 61yom discharged 12/13, workup for suspected multiple myeloma with BM biopsy pending on discharge; 12/14 BM biopsy results available, oncology requested direct admit for aggressive inpt tx. Chemotherapy initiated 12/15. 12/16 decompensated with fever, tachycardia, hypotension; started on abx. Treated for TLS. Nephrology consulted for worsening creatinine.  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 - continue management per oncology. Chemotherapy initiated 12/15. Sp Rasburicase 12/17. Continue allopurinol. - daily uric acid levels (down to 21.1today) - Velcade per oncology   AKI, associated hyperkalemia and metabolic acidosis. complicated, multifactorial, hypotension, malignancy, TLS. - worsening but no uremia. Creatinine 4.44 >> 4.62 >> 5.12, BUN continues to trend upward, adequate UOP. K+ normal. - continue management per nephrology  Anemia of malignancy. - s/p 2 units PRBC 12/15, 1 unit 12/17. - Hgb remains stable - follow CBC. Heparin stopped.  Thrombocytopenia - Plts up today. Check CBC in AM - new over last 72 hours. Suspect secondary to chemotherapy. Management per oncology. - heparin stopped 12/18.  SIRS. Resolved. Afebrile 48 hours. CT abd/pelvis with pericholecystic fluid. LFTs unremarkable. Lactic back to normal CXR right base atelectasis vs pneumonia but no infiltrate on chest CT - remains stable, afebrile, still no evidence of infection. UC unremarkable. BC NG thus far. - no abd symptoms or RUQ pain. No further eval for pericholecystic fluid now. Transaminases likely up from hepatitis C. - d/c cefepime and follow.    Hepatitis C, new dx. - f/u with GI as an outpt  Elevated transaminases - isolated, CT liver appearance unremarkable. Likely secondary to hepatitis C. Follow clinically.  Pathologic right femur fx s/p  intramedullary nail 12/11 - WBAT per ortho. F/u end of year for suture removal. 6 weeks day ASA 325 for DVT proph per ortho.  Essential HTN. - holding amlodipine.   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 Management of acute conditions per oncology and nephrology. Not ready for d/c with unstable creatinine.  DVT prophylaxis: place SCDs Code Status: full Family Communication: none Disposition Plan: pending    Daniel Goodrich, MD  [facility] Direct contact: [phone] --Via amion app OR  --[email or web address]; password [email or web address]  7PM-7AM contact night coverage as above 11/28/2017, 10:08 AM  LOS: 5 days   Consultants:  Oncology   Procedures: 2 units PRBC 12/15 1 unit PRBC 12/17  Antimicrobials:  Cefepime 12/17 >> 12/19  Vancomycin 12/17 >> 12/18  Interval history/Subjective: No complaints today. No n/v.    Objective: Vitals:  Vitals:   11/28/17 0800 11/28/17 0830  BP: (!) 95/45   Pulse: 99   Resp: (!) 29   Temp:  98.5 F (36.9 C)  SpO2: 93%     Exam:  Constitutional:   . Appears calm and comfortable Eyes:  . pupils and irises appear normal . Normal lids  ENMT:  . grossly normal hearing  . Lips appear normal Respiratory:  . CTA bilaterally, no w/r/r.  . Respiratory effort normal.  Cardiovascular:  . RRR, no m/r/g . No LE extremity edema    Abdomen soft, nt Psychiatric:  . Mental status o Mood, affect appropriate   I have personally reviewed the following:  Filed Weights   11/23/17 1443 11/25/17 0500  Weight: 65.3 kg (144 lb) 70.1 kg (154 lb 8.7 oz)   Weight change:   UOP:   12 I/O since admission: +79 Last BM charted: 12/18 Foley: no  Telemetry: ST Status: SDU  Labs:  Creatinine 4.62 >> 5.12  Hgb 9.1 >> 8.8  Plts 95 >> 98  Review and summation of old records:  Discharge 12/13. AKI, hypercalcemia, skeletal lytic  lesions. Treated with Decadron and pamidronate.  Echo 11/18/2017 Study Conclusions  - Left ventricle: The cavity size was normal. Wall thickness was   increased in a pattern of mild LVH. Systolic function was normal.   The estimated ejection fraction was in the range of 60% to 65%.   Wall motion was normal; there were no regional wall motion   abnormalities. Doppler parameters are consistent with abnormal   left ventricular relaxation (grade 1 diastolic dysfunction).  Scheduled Meds: . acyclovir  200 mg Oral Daily  . allopurinol  100 mg Oral Daily  . polyethylene glycol  17 g Oral Daily  . sodium bicarbonate  650 mg Oral TID   Continuous Infusions: . sodium chloride    . sodium chloride      Principal Problem:   Plasma cell leukemia (HCC) Active Problems:   Tobacco abuse   Pathological fracture in neoplastic disease, right femur, initial encounter for fracture (HCC)   Tumor lysis syndrome   Encounter for antineoplastic chemotherapy   Cancer associated pain   AKI (acute kidney injury) ([place])   SIRS (systemic inflammatory response syndrome) (HCC)   Anemia   Thrombocytopenia (HCC)   Hepatitis C   LOS: 5 days

## 2017-11-29 ENCOUNTER — Inpatient Hospital Stay (HOSPITAL_COMMUNITY): Payer: BLUE CROSS/BLUE SHIELD

## 2017-11-29 ENCOUNTER — Other Ambulatory Visit: Payer: Self-pay | Admitting: Hematology

## 2017-11-29 DIAGNOSIS — D630 Anemia in neoplastic disease: Secondary | ICD-10-CM

## 2017-11-29 DIAGNOSIS — F17200 Nicotine dependence, unspecified, uncomplicated: Secondary | ICD-10-CM

## 2017-11-29 DIAGNOSIS — R531 Weakness: Secondary | ICD-10-CM

## 2017-11-29 LAB — CBC WITH DIFFERENTIAL/PLATELET
BAND NEUTROPHILS: 13 %
BASOS PCT: 0 %
Basophils Absolute: 0 10*3/uL (ref 0.0–0.1)
Blasts: 0 %
EOS ABS: 0 10*3/uL (ref 0.0–0.7)
Eosinophils Relative: 0 %
HCT: 25.6 % — ABNORMAL LOW (ref 39.0–52.0)
Hemoglobin: 8.8 g/dL — ABNORMAL LOW (ref 13.0–17.0)
LYMPHS ABS: 2.5 10*3/uL (ref 0.7–4.0)
LYMPHS PCT: 25 %
MCH: 30.9 pg (ref 26.0–34.0)
MCHC: 34.4 g/dL (ref 30.0–36.0)
MCV: 89.8 fL (ref 78.0–100.0)
MONO ABS: 0 10*3/uL — AB (ref 0.1–1.0)
MONOS PCT: 0 %
Metamyelocytes Relative: 2 %
Myelocytes: 1 %
NEUTROS ABS: 7.4 10*3/uL (ref 1.7–7.7)
Neutrophils Relative %: 58 %
OTHER: 1 %
PLATELETS: 91 10*3/uL — AB (ref 150–400)
Promyelocytes Absolute: 0 %
RBC: 2.85 MIL/uL — ABNORMAL LOW (ref 4.22–5.81)
RDW: 15.6 % — AB (ref 11.5–15.5)
WBC: 10 10*3/uL (ref 4.0–10.5)
nRBC: 0 /100 WBC

## 2017-11-29 LAB — COMPREHENSIVE METABOLIC PANEL
ALBUMIN: 2.4 g/dL — AB (ref 3.5–5.0)
ALT: 52 U/L (ref 17–63)
ANION GAP: 11 (ref 5–15)
AST: 168 U/L — ABNORMAL HIGH (ref 15–41)
Alkaline Phosphatase: 84 U/L (ref 38–126)
BILIRUBIN TOTAL: 0.9 mg/dL (ref 0.3–1.2)
BUN: 85 mg/dL — ABNORMAL HIGH (ref 6–20)
CO2: 21 mmol/L — AB (ref 22–32)
Calcium: 7.5 mg/dL — ABNORMAL LOW (ref 8.9–10.3)
Chloride: 111 mmol/L (ref 101–111)
Creatinine, Ser: 5.37 mg/dL — ABNORMAL HIGH (ref 0.61–1.24)
GFR calc Af Amer: 12 mL/min — ABNORMAL LOW (ref >60)
GFR calc non Af Amer: 10 mL/min — ABNORMAL LOW (ref >60)
GLUCOSE: 115 mg/dL — AB (ref 65–99)
POTASSIUM: 4.2 mmol/L (ref 3.5–5.1)
SODIUM: 143 mmol/L (ref 135–145)
TOTAL PROTEIN: 5.4 g/dL — AB (ref 6.5–8.1)

## 2017-11-29 LAB — RENAL FUNCTION PANEL
ANION GAP: 12 (ref 5–15)
Albumin: 2.4 g/dL — ABNORMAL LOW (ref 3.5–5.0)
BUN: 82 mg/dL — AB (ref 6–20)
CO2: 21 mmol/L — AB (ref 22–32)
Calcium: 7.4 mg/dL — ABNORMAL LOW (ref 8.9–10.3)
Chloride: 110 mmol/L (ref 101–111)
Creatinine, Ser: 5.37 mg/dL — ABNORMAL HIGH (ref 0.61–1.24)
GFR calc Af Amer: 12 mL/min — ABNORMAL LOW (ref >60)
GFR calc non Af Amer: 10 mL/min — ABNORMAL LOW (ref >60)
GLUCOSE: 115 mg/dL — AB (ref 65–99)
POTASSIUM: 4.2 mmol/L (ref 3.5–5.1)
Phosphorus: 5 mg/dL — ABNORMAL HIGH (ref 2.5–4.6)
SODIUM: 143 mmol/L (ref 135–145)

## 2017-11-29 LAB — URIC ACID: Uric Acid, Serum: 0.9 mg/dL — ABNORMAL LOW (ref 4.4–7.6)

## 2017-11-29 LAB — CHROMOSOME ANALYSIS, BONE MARROW

## 2017-11-29 NOTE — Progress Notes (Signed)
PT Cancellation Note  Patient Details Name: GERALD KUEHL MRN: [id number] DOB: Mar 31, 1956   Cancelled Treatment:     PT attempted x 2 but deferred in am 2* pt wanting to wait until after [name] and in pm 2* pt fatigue and "just not feeling good".  Pt PROMISED to participate with PT tomorrow.   Reggie Welge 11/29/2017, 1:56 PM

## 2017-11-29 NOTE — Progress Notes (Signed)
START ON PATHWAY REGIMEN - Multiple Myeloma and Other Plasma Cell Dyscrasias     A cycle is every 21 days:     Bortezomib      Cyclophosphamide      Dexamethasone   **Always confirm dose/schedule in your pharmacy ordering system**    Patient Characteristics: Newly Diagnosed, Transplant Ineligible or Refused, Unknown or Awaiting Test Results R-ISS Staging: Unknown Disease Classification: Newly Diagnosed Is Patient Eligible for Transplant<= Transplant Ineligible or Refused Risk Status: Unknown Intent of Therapy: Non-Curative / Palliative Intent, Discussed with Patient

## 2017-11-29 NOTE — Progress Notes (Signed)
OT Cancellation Note  Patient Details Name: Andres Fernandez MRN: [id number] DOB: [birth year]   Cancelled Treatment:    Reason Eval/Treat Not Completed: Other (comment). Attempted to see pt twice today. Stated he wanted to wait until after U/S then just didn't feel up to movement. Pt verbalizes that he knows he needs to move.  He states he will get up tomorrow.  Fatime Biswell 11/29/2017, 2:02 PM  Lesle Chris, OTR/L [phone] 11/29/2017

## 2017-11-29 NOTE — Progress Notes (Signed)
[name]   HEMATOLOGY/ONCOLOGY INPATIENT PROGRESS NOTE  Date of Service: 11/27/2017 Inpatient Attending: .Samuella Cota, MD   SUBJECTIVE  Patient was seen today for oncologic followup. Creatinine continues to increase gradually. Uric down after rasburicase. Notes he is feeling a little better after PRBC transfusion. hgb up to 9.1. Received 2nd dose of Velcade . LFT elevated - monitoring. Procalcitonin downtrending.  OBJECTIVE:  NAD  PHYSICAL EXAMINATION: .VS reviewed GENERAL:alert, in no acute distress and comfortable SKIN: no acute rashes, no significant lesions EYES: conjunctiva are pink and non-injected, sclera anicteric OROPHARYNX: MMM, no exudates, no oropharyngeal erythema or ulceration NECK: supple,  JVD+ LYMPH:  no palpable lymphadenopathy in the cervical, axillary or inguinal regions LUNGS: no basal rales. HEART: regular rate & rhythm ABDOMEN:  normoactive bowel sounds , non tender, not distended. Extremity: no pedal edema PSYCH: alert & oriented x 3 with fluent speech NEURO: no focal motor/sensory deficits   MEDICAL HISTORY:  Past Medical History:  Diagnosis Date  . BPH (benign prostatic hyperplasia)   . Hepatitis C 11/27/2017  . Hypertension   . 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 Tobacco abuse     SURGICAL HISTORY: Past Surgical History:  Procedure Laterality Date  . APPENDECTOMY    . FEMUR IM NAIL Right 11/20/2017   Procedure: RIGHT FEMORAL INTRAMEDULLARY (IM) NAIL;  Surgeon: Nicholes Stairs, MD;  Location: [facility];  Service: Orthopedics;  Laterality: Right;    SOCIAL HISTORY: Social History   Socioeconomic History  . Marital status: Single    Spouse name: Not on file  . Number of children: Not on file  . Years of education: Not on file  . Highest education level: Not on file  Social Needs  . Financial resource strain: Not on file  . Food insecurity - worry: Not on file  . Food insecurity - inability: Not on file  . Transportation  needs - medical: Not on file  . Transportation needs - non-medical: Not on file  Occupational History  . Not on file  Tobacco Use  . Smoking status: Former Smoker    Packs/day: 0.50    Types: Cigarettes    Last attempt to quit: 11/09/2017    Years since quitting: 0.0  . Smokeless tobacco: Never Used  . Tobacco comment: no cigarettes x 1 week.  Substance and Sexual Activity  . Alcohol use: Yes  . Drug use: No  . Sexual activity: Not on file  Other Topics Concern  . Not on file  Social History Narrative  . Not on file    FAMILY HISTORY: Family History  Problem Relation Age of Onset  . COPD Mother   . Liver cancer Mother     ALLERGIES:  has No Known Allergies.  MEDICATIONS:  Scheduled Meds: . acyclovir  200 mg Oral Daily  . allopurinol  100 mg Oral Daily  . polyethylene glycol  17 g Oral Daily  . sodium bicarbonate  650 mg Oral TID   Continuous Infusions: . sodium chloride    . sodium chloride     PRN Meds:.acetaminophen, heparin lock flush, heparin lock flush, heparin lock flush, heparin lock flush, iopamidol, lip balm, ondansetron (ZOFRAN) IV, oxyCODONE, sodium chloride flush, sodium chloride flush, sodium chloride flush, sodium chloride flush  REVIEW OF SYSTEMS:    10 Point review of Systems was done is negative except as noted above.   LABORATORY DATA:  I have reviewed the data as listed  . CBC Latest Ref Rng & Units 11/27/2017 11/26/2017  WBC 4.0 - 10.5 K/uL 10.2 8.6  Hemoglobin 13.0 - 17.0 g/dL 9.1(L) 7.7(L)  Hematocrit 39.0 - 52.0 % 25.9(L) 22.6(L)  Platelets 150 - 400 K/uL 95(L) 126(L)   . CMP Latest Ref Rng & Units 11/27/2017 11/26/2017  Glucose 65 - 99 mg/dL 109(H) 132(H)  BUN 6 - 20 mg/dL 71(H) 60(H)  Creatinine 0.61 - 1.24 mg/dL 4.62(H) 4.44(H)  Sodium 135 - 145 mmol/L 138 141  Potassium 3.5 - 5.1 mmol/L 4.6 5.6(H)  Chloride 101 - 111 mmol/L 107 115(H)  CO2 22 - 32 mmol/L 19(L) 18(L)  Calcium 8.9 - 10.3 mg/dL 7.3(L) 7.0(L)  Total Protein  6.5 - 8.1 g/dL 5.3(L) 5.3(L)  Total Bilirubin 0.3 - 1.2 mg/dL 0.7 0.9  Alkaline Phos 38 - 126 U/L 71 54  AST 15 - 41 U/L 104(H) 64(H)  ALT 17 - 63 U/L 69(H) 39    Component     Latest Ref Rng & Units 11/17/2017  Prostatic Specific Antigen     0.00 - 4.00 ng/mL 0.83  HIV Screen 4th Generation wRfx     Non Reactive Non Reactive  TSH     0.350 - 4.500 uIU/mL 1.874  Beta-2 Microglobulin     0.6 - 2.4 mg/L 25.7 (H)             RADIOGRAPHIC STUDIES: I have personally reviewed the radiological images as listed and agreed with the findings in the report. Ct Abdomen Pelvis Wo Contrast  Result Date: 11/25/2017 CLINICAL DATA:  Fever of unknown origin EXAM: CT CHEST, ABDOMEN AND PELVIS WITHOUT CONTRAST TECHNIQUE: Multidetector CT imaging of the chest, abdomen and pelvis was performed following the standard protocol without IV contrast. COMPARISON:  Left lower extremity CT performed 11/17/2017. FINDINGS: CT CHEST FINDINGS Cardiovascular: Heart is borderline enlarged. Aorta is normal caliber with scattered aortic calcifications. Mediastinum/Nodes: No mediastinal, hilar, or axillary adenopathy. Lungs/Pleura: Severe centrilobular and paraseptal emphysema. Trace left pleural effusion. No confluent airspace opacities. Musculoskeletal: No acute bony abnormality. CT ABDOMEN PELVIS FINDINGS Hepatobiliary: Pericholecystic fluid present. No visible focal hepatic abnormality. Pancreas: No focal abnormality or ductal dilatation. Spleen: No focal abnormality. Normal size. Small calcification in the superior spleen. Adrenals/Urinary Tract: No adrenal abnormality. No focal renal abnormality. No stones or hydronephrosis. Urinary bladder is unremarkable. Stomach/Bowel: Stomach, large and small bowel grossly unremarkable. Moderate stool burden in the colon. Vascular/Lymphatic: No evidence of aneurysm or adenopathy. Aortic and iliac calcifications. Reproductive: No visible focal abnormality. Other: No free fluid  or free air. Musculoskeletal: Stranding noted within the subcutaneous soft tissues overlying the right lateral buttocks. There is heterogeneous material within the right gluteus muscles. These findings are likely related to recent hip surgery. Lytic destructive process noted within the left iliac bone on image 86 measuring up to 2.6 cm, unchanged since prior left lower extremity CT. IMPRESSION: Pericholecystic fluid noted. If there is clinical concern for cholecystitis, this could be further evaluated with ultrasound. Lytic destructive lesion within the left lateral iliac bone measuring up to 2.6 cm. It is most likely reflects an area of multiple myeloma or metastasis. Intramuscular hematoma within the right gluteus muscles with subcutaneous stranding, compatible with recent surgery. Electronically Signed   By: Rolm Baptise M.D.   On: 11/25/2017 14:28   Dg Chest 2 View  Result Date: 11/17/2017 CLINICAL DATA:  61 year old male with chest pain. EXAM: CHEST  2 VIEW COMPARISON:  Chest radiograph dated 05/10/2015 FINDINGS: Mild right lung base atelectatic changes/ scarring. There is no focal consolidation, pleural effusion, or pneumothorax.  The cardiac silhouette is within normal limits. There is mild degenerative changes of the spine. No acute osseous pathology. IMPRESSION: No active cardiopulmonary disease. Electronically Signed   By: Anner Crete M.D.   On: 11/17/2017 07:08   Ct Head Wo Contrast  Result Date: 11/17/2017 CLINICAL DATA:  61 year old male with numbness and tingling. Paraplegia. EXAM: CT HEAD WITHOUT CONTRAST TECHNIQUE: Contiguous axial images were obtained from the base of the skull through the vertex without intravenous contrast. COMPARISON:  Facial bone CT dated 03/27/2010 FINDINGS: Brain: The ventricles and sulci appropriate size for patient's age. Minimal periventricular and deep white matter chronic microvascular ischemic changes noted. There is no acute intracranial hemorrhage. No mass  effect or midline shift noted. No extra-axial fluid collection. Vascular: There is slight high attenuation of the right MCA in the sylvian fissure (series 7 image 12). This may be chronic. If there is clinical concern for right MCA territory ischemia further evaluation with CT angiography of the head or MRI is recommended. Skull: There are innumerable lucent calvarial lesions concerning for multiple myeloma or metastatic disease. Correlation with history of known malignancy recommended. No acute fracture. Sinuses/Orbits: There is complete opacification of the visualized right maxillary sinus. Mild mucoperiosteal thickening of paranasal sinuses. There is a lytic lesion involving the posterior right mastoid versus effusion. Other: None IMPRESSION: 1. No acute intracranial hemorrhage. 2. Slightly high attenuation of the right MCA in the right sylvian fissure which may be chronic. Further evaluation with CTA or MRI is recommended if there is high clinical concern for acute right MCA territory ischemia. 3. 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 Correlation with history of malignancy recommended. Electronically Signed   By: Anner Crete M.D.   On: 11/17/2017 06:05   Ct Chest Wo Contrast  Result Date: 11/25/2017 CLINICAL DATA:  Fever of unknown origin EXAM: CT CHEST, ABDOMEN AND PELVIS WITHOUT CONTRAST TECHNIQUE: Multidetector CT imaging of the chest, abdomen and pelvis was performed following the standard protocol without IV contrast. COMPARISON:  Left lower extremity CT performed 11/17/2017. FINDINGS: CT CHEST FINDINGS Cardiovascular: Heart is borderline enlarged. Aorta is normal caliber with scattered aortic calcifications. Mediastinum/Nodes: No mediastinal, hilar, or axillary adenopathy. Lungs/Pleura: Severe centrilobular and paraseptal emphysema. Trace left pleural effusion. No confluent airspace opacities. Musculoskeletal: No acute bony abnormality. CT ABDOMEN PELVIS FINDINGS Hepatobiliary:  Pericholecystic fluid present. No visible focal hepatic abnormality. Pancreas: No focal abnormality or ductal dilatation. Spleen: No focal abnormality. Normal size. Small calcification in the superior spleen. Adrenals/Urinary Tract: No adrenal abnormality. No focal renal abnormality. No stones or hydronephrosis. Urinary bladder is unremarkable. Stomach/Bowel: Stomach, large and small bowel grossly unremarkable. Moderate stool burden in the colon. Vascular/Lymphatic: No evidence of aneurysm or adenopathy. Aortic and iliac calcifications. Reproductive: No visible focal abnormality. Other: No free fluid or free air. Musculoskeletal: Stranding noted within the subcutaneous soft tissues overlying the right lateral buttocks. There is heterogeneous material within the right gluteus muscles. These findings are likely related to recent hip surgery. Lytic destructive process noted within the left iliac bone on image 86 measuring up to 2.6 cm, unchanged since prior left lower extremity CT. IMPRESSION: Pericholecystic fluid noted. If there is clinical concern for cholecystitis, this could be further evaluated with ultrasound. Lytic destructive lesion within the left lateral iliac bone measuring up to 2.6 cm. It is most likely reflects an area of multiple myeloma or metastasis. Intramuscular hematoma within the right gluteus muscles with subcutaneous stranding, compatible with recent surgery. Electronically Signed   By: Lennette Bihari  [place] M.D.   On: 11/25/2017 14:28   US Renal  Result Date: 11/17/2017 CLINICAL DATA:  Acute renal injury EXAM: RENAL / URINARY TRACT ULTRASOUND COMPLETE COMPARISON:  None. FINDINGS: Right Kidney: Length: 10.3 cm. Increased cortical echogenicity. No hydronephrosis. Than renal cortex. Left Kidney: Length: 10.2 cm. Increased cortical echogenicity. No hydronephrosis. Thinned renal cortex. Bladder: Appears normal for degree of bladder distention. IMPRESSION: 1. No hydronephrosis. 2. Increased cortical  echogenicity and thinned renal cortex suggesting medical renal disease . Electronically Signed   By: Suzy Bouchard M.D.   On: 11/17/2017 08:32   Ct Femur Left Wo Contrast  Result Date: 11/18/2017 CLINICAL DATA:  Bilateral upper leg pain, worse on the right for 2 weeks. Diffuse weakness. Lytic lesions in the femurs on osseous survey. EXAM: CT OF THE LOWER LEFT EXTREMITY WITHOUT CONTRAST TECHNIQUE: Multidetector CT imaging of the lower left extremity was performed according to the standard protocol. COMPARISON:  Osseous survey 11/17/2017. FINDINGS: Bones/Joint/Cartilage No fracture is identified on the right or left. Multiple foci of endosteal scalloping are seen in the femurs bilaterally consistent with lytic lesions on plain films. Tumor burden appears fairly symmetric on the right and left. Lytic lesions are also identified in the right and left acetabuli. On the left, cortical disruption is seen adjacent to the femoral head in the anterior wall measuring 1.4 cm AP, extending 0.9 cm into bone medially and measuring 2.2 cm craniocaudal. The inner cortical wall is not disrupted. On the right, lysis extends across the medial wall of the acetabulum where there is cortical destruction adjacent to the femoral head measuring 3.5 cm AP AP by 2.0 cm craniocaudal. The inner cortical wall is not disrupted. Multiple foci of fatty marrow replacement are identified in both femurs consistent with tumor deposits. Ligaments Suboptimally assessed by CT. Muscles and Tendons Intact and normal appearance. Soft tissues No acute abnormality. IMPRESSION: Negative for fracture. Multiple lytic lesions are present throughout both femurs with an appearance most consistent with multiple myeloma. Lytic lesions are also seen in the walls of the acetabuli bilaterally most consistent with multiple myeloma. As described above, the inner cortical wall of both the right and left acetabulum is preserved with destruction of the outer cortical  wall identified. Electronically Signed   By: Inge Rise M.D.   On: 11/18/2017 10:24   Ct Femur Right Wo Contrast  Result Date: 11/18/2017 CLINICAL DATA:  Bilateral upper leg pain, worse on the right for 2 weeks. Diffuse weakness. Lytic lesions in the femurs on osseous survey. EXAM: CT OF THE LOWER LEFT EXTREMITY WITHOUT CONTRAST TECHNIQUE: Multidetector CT imaging of the lower left extremity was performed according to the standard protocol. COMPARISON:  Osseous survey 11/17/2017. FINDINGS: Bones/Joint/Cartilage No fracture is identified on the right or left. Multiple foci of endosteal scalloping are seen in the femurs bilaterally consistent with lytic lesions on plain films. Tumor burden appears fairly symmetric on the right and left. Lytic lesions are also identified in the right and left acetabuli. On the left, cortical disruption is seen adjacent to the femoral head in the anterior wall measuring 1.4 cm AP, extending 0.9 cm into bone medially and measuring 2.2 cm craniocaudal. The inner cortical wall is not disrupted. On the right, lysis extends across the medial wall of the acetabulum where there is cortical destruction adjacent to the femoral head measuring 3.5 cm AP AP by 2.0 cm craniocaudal. The inner cortical wall is not disrupted. Multiple foci of fatty marrow replacement are identified in both femurs consistent  with tumor deposits. Ligaments Suboptimally assessed by CT. Muscles and Tendons Intact and normal appearance. Soft tissues No acute abnormality. IMPRESSION: Negative for fracture. Multiple lytic lesions are present throughout both femurs with an appearance most consistent with multiple myeloma. Lytic lesions are also seen in the walls of the acetabuli bilaterally most consistent with multiple myeloma. As described above, the inner cortical wall of both the right and left acetabulum is preserved with destruction of the outer cortical wall identified. Electronically Signed   By: Inge Rise M.D.   On: 11/18/2017 10:24   Dg Chest Port 1 View  Result Date: 11/26/2017 CLINICAL DATA:  60 year old male with shortness of breath and bibasilar crackles EXAM: PORTABLE CHEST 1 VIEW COMPARISON:  11/25/2017 CT, 11/17/2017 chest radiograph and prior studies. FINDINGS: Upper limits normal heart size noted. Pulmonary vascular congestion and interstitial opacities are present likely representing interstitial edema. Increased opacity at the right lung base may represent atelectasis, focal edema or possibly superimposed pneumonia. A lytic lesion within the anterolateral left eighth rib is noted. IMPRESSION: 1. Pulmonary vascular congestion and probable interstitial pulmonary edema. 2. Right basilar opacity which may represent atelectasis, focal edema or possibly superimposed pneumonia. 3. Left eighth rib lytic lesion -question multiple myeloma or metastatic disease. Electronically Signed   By: Margarette Canada M.D.   On: 11/26/2017 11:08   Dg Bone Survey Met  Result Date: 11/17/2017 CLINICAL DATA:  He is complaining of numbness in his distal arms and legs which has been present for the last 2 weeks. It is been generally getting worse. He also has noted a painful knot in the right rib cage. He denies fever, chills, sweats.*comment was truncated* in a have a EXAM: METASTATIC BONE SURVEY COMPARISON:  Head -CT 11/17/2017 FINDINGS: Skull: Multiple lytic lesions throughout the calvarium. Lytic lesions scalloped the cortex of the LEFT and RIGHT humeri and clavicles. Lytic lesions in the radius and ulna of the forearms Lytic lesions scalloping enter cortex of the LEFT RIGHT femur. Lytic lesions within the fibulae. IMPRESSION: 1. Lytic lesions scalloping the inner cortex of the axillary and appendicular skeleton including the skull, clavicles, humeri, femurs and distal long bones. 2. Findings most suggestive multiple myeloma. Electronically Signed   By: Suzy Bouchard M.D.   On: 11/17/2017 09:50   Ct Bone Marrow  Biopsy & Aspiration  Result Date: 11/20/2017 INDICATION: 61 year old male with a history of multiple myeloma EXAM: CT BONE MARROW BIOPSY AND ASPIRATION MEDICATIONS: None. ANESTHESIA/SEDATION: Moderate (conscious) sedation was employed during this procedure. A total of Versed 2.0 mg and Fentanyl 2 Hunter mcg was administered intravenously. Moderate Sedation Time: 11 minutes. The patient's level of consciousness and vital signs were monitored continuously by radiology nursing throughout the procedure under my direct supervision. FLUOROSCOPY TIME:  CT COMPLICATIONS: None PROCEDURE: The procedure risks, benefits, and alternatives were explained to the patient. Questions regarding the procedure were encouraged and answered. The patient understands and consents to the procedure. Scout CT of the pelvis was performed for surgical planning purposes. The posterior pelvis was prepped with chlorhexidinein a sterile fashion, and a sterile drape was applied covering the operative field. A sterile gown and sterile gloves were used for the procedure. Local anesthesia was provided with 1% Lidocaine. We targeted the left posterior iliac bone for biopsy. The skin and subcutaneous tissues were infiltrated with 1% lidocaine without epinephrine. A small stab incision was made with an 11 blade scalpel, and an 11 gauge Murphy needle was advanced with CT guidance to the posterior  cortex. Manual forced was used to advance the needle through the posterior cortex and the stylet was removed. A bone marrow aspirate was retrieved and passed to a cytotechnologist in the room. The Murphy needle was then advanced without the stylet for a core biopsy. The core biopsy was retrieved and also passed to a cytotechnologist. Manual pressure was used for hemostasis and a sterile dressing was placed. No complications were encountered no significant blood loss was encountered. Patient tolerated the procedure well and remained hemodynamically stable  throughout. IMPRESSION: Status post CT-guided bone marrow biopsy, with tissue specimen sent to pathology for complete histopathologic analysis Signed, Dulcy Fanny. Earleen Newport, DO [facility] Electronically Signed   By: Corrie Mckusick D.O.   On: 11/20/2017 10:06   Dg C-arm 1-60 Min  Result Date: 11/20/2017 CLINICAL DATA:  Status post inter medullary nail and screw placement for impending right femur fracture due to metastatic disease. Multiple myeloma. EXAM: RIGHT FEMUR PORTABLE 2 VIEW; DG C-ARM 61-120 MIN COMPARISON:  CT scan of the femurs of November 17, 2017 FINDINGS: Fluoro time reported is 1 minutes, 24 seconds. Two fluoro spot images are observed. An intramedullary rod within the femoral shaft present. There are 2 telescoping screws in the right femoral neck and head and securing screw in the lower right femoral shaft. There is no immediate postprocedure complication. IMPRESSION: No immediate postprocedure complication following intramedullary nail placement of the right femur. Electronically Signed   By: David  Martinique M.D.   On: 11/20/2017 13:41   Dg Femur Port, Min 2 Views Right  Result Date: 11/20/2017 CLINICAL DATA:  Status post inter medullary nail and screw placement for impending right femur fracture due to metastatic disease. Multiple myeloma. EXAM: RIGHT FEMUR PORTABLE 2 VIEW; DG C-ARM 61-120 MIN COMPARISON:  CT scan of the femurs of November 17, 2017 FINDINGS: Fluoro time reported is 1 minutes, 24 seconds. Two fluoro spot images are observed. An intramedullary rod within the femoral shaft present. There are 2 telescoping screws in the right femoral neck and head and securing screw in the lower right femoral shaft. There is no immediate postprocedure complication. IMPRESSION: No immediate postprocedure complication following intramedullary nail placement of the right femur. Electronically Signed   By: David  Martinique M.D.   On: 11/20/2017 13:41     ASSESSMENT & PLAN:   61 y.o. AA male presenting with severe hypercalcemia, skeletal findings consistent with myeloma and significant leukocytosis/lymphocytosis in the peripheral bloodas well as AKI and hypercalcemia.  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  Prelim- BM Bx showed >95% involvement with kappa restricted serum free light chains. 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  Lymphocytosis/leukocytosis resolved with high-dose steroids and Velcade. Plan -Velcade 2nd dose done today -will hold Cyclophosphamide in the setting of sepsis.for a few days until infection undercontrol with Abx  2) s/p Prophylactic IM nailing for rt femur.  3)TLS - uric acid higher today Plan -continue Allopurinol -received 1 dose of Rasburicase yesterday -- uric acid improved -monitor daily Uric acid.  4) h/o tobacco abuse  5) h/o cocaine and ETOH abuse -- sober for 3 yrs  6) AKI vs ARF on CKD -- due to myeloma.  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 Hemoglobin down to 7 -- now up to 8.1 s/p PRBC transfusion and trending down to 7.7. Some drop in hgb due to rt gluteal hematoma Plan hgb up to 9.1 post transfusion.  10) Hyperkalemia due to some TLS + renal insufficiency K resolved ot 4.6 -monitoring   11) Sepsis - elevated procalcitonin ..concern for pneumonia PLAN -on broad spectrum abx  6) multiple myeloma related skeletal pain -Oxycodone 5-10 mg every 6 hours as needed -prn IV dilaudid given renal insuff with risk of accumulation or morphine metabolites -Senna S for bowel prophylaxis  I shall continue to follow  daily. Appreciate excellent hospital medicine cares.   I spent 25 minutes counseling the patient face to face. The total time spent in the appointment was 35 minutes and more than 50% was on counseling and direct patient cares.    Sullivan Lone MD [place] AAHIVMS [facility] Hematology/Oncology Physician [facility]  (Office):       [phone] (Work cell):  [phone] (Fax):           [phone]

## 2017-11-29 NOTE — Progress Notes (Signed)
Admit: 11/23/2017 LOS: 6  47M with renal failure (time course unclear) in setting of kappa LC myeloma  Subjective:  2.1L UOP yesterda Afebrile, BP stable SCr slightly up, K and HCO3 ok  12/19 0701 - 12/20 0700 In: 640 [P.O.:360; I.V.:280] Out: 2150 [Urine:2150]  Filed Weights   11/23/17 1443 11/25/17 0500 11/29/17 0401  Weight: 65.3 kg (144 lb) 70.1 kg (154 lb 8.7 oz) 66 kg (145 lb 8.1 oz)    Scheduled Meds: . acyclovir  200 mg Oral Daily  . allopurinol  100 mg Oral Daily  . polyethylene glycol  17 g Oral Daily  . sodium bicarbonate  650 mg Oral TID   Continuous Infusions: . sodium chloride    . sodium chloride     PRN Meds:.acetaminophen, heparin lock flush, heparin lock flush, heparin lock flush, heparin lock flush, iopamidol, lip balm, ondansetron (ZOFRAN) IV, oxyCODONE, sodium chloride flush, sodium chloride flush, sodium chloride flush, sodium chloride flush  Current Labs: reviewed  Results for DENISE, BRAMBLETT (MRN [id number]) as of 11/29/2017 12:29  Ref. Range 11/17/2017 10:47 11/23/2017 18:00  Kappa free light chain Latest Ref Range: 3.3 - 19.4 mg/L 27,634.1 (H) 13,734.8 (H)  Lamda free light chains Latest Ref Range: 5.7 - 26.3 mg/L 5.8 2.3 (L)  Kappa, lamda light chain ratio Latest Ref Range: 0.26 - 1.65  4,764.50 (H) 5,971.65 (H)   12/8 Renal US:   IMPRESSION: 1. No hydronephrosis. 2. Increased cortical echogenicity and thinned renal cortex suggesting medical renal disease .  Results for EFOSA, TREICHLER (MRN [id number]) as of 11/29/2017 12:29  Ref. Range 11/23/2017 18:00  Hepatitis B Surface Ag Latest Ref Range: Negative  Negative  Hep B Core Ab, Tot Latest Ref Range: Negative  Negative  Test Information Unknown Comment  Hcv Genotype Unknown Comment  HEPATITIS C GENOTYPE Unknown 1b  Hepatitis C Quantitation Latest Units: IU/mL See Final Results  HCV RNA (International Units) Latest Units: IU/mL 10,200,000  HCV log10 Latest Units: log10 IU/mL 7.009     Physical Exam:  Blood pressure 134/81, pulse 80, temperature 98.3 F (36.8 C), temperature source Oral, resp. rate (!) 24, height 5\' 8"  (1.727 m), weight 66 kg (145 lb 8.1 oz), SpO2 94 %. Thin, nad RRR Coarse bs/ b/l No LEE S/nt/nd nonfocal    A 1. Renal Failure, unclear time course 2. Kappa LC MM; Velcade + dexmethasone 3. Hypercalcemia 4. TLS, allopurinol, rec rasburicase  P 1. 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 On velcade started 12/18; hopefully LC burden will come down 3. No info on baseline SCr, presumably was much better than this 4. Given I don't know his baseline renal function and the ambiguity of TPE data will defer on TPE currently  Pearson Grippe MD 11/29/2017, 12:29 PM  Recent Labs  Lab 11/27/17 0701 11/28/17 0329 11/29/17 0258  NA 138 141 143  143  K 4.6 4.1 4.2  4.2  CL 107 107 110  111  CO2 19* 23 21*  21*  GLUCOSE 109* 127* 115*  115*  BUN 71* 82* 82*  85*  CREATININE 4.62* 5.12* 5.37*  5.37*  CALCIUM 7.3* 7.1* 7.4*  7.5*  PHOS  --   --  5.0*   Recent Labs  Lab 11/26/17 0335 11/27/17 0701 11/28/17 0329 11/29/17 0258  WBC 8.6 10.2 11.0* 10.0  NEUTROABS 6.5 5.3  --  7.4  HGB 7.7* 9.1* 8.8* 8.8*  HCT 22.6* 25.9* 26.0* 25.6*  MCV 87.9 86.9 88.4 89.8  PLT 126* 95* 98* 91*

## 2017-11-29 NOTE — Progress Notes (Signed)
PROGRESS NOTE    Andres Fernandez  EQA:[id number] DOB: 02/09/1956 DOA: 11/23/2017 PCP: Patient, No Pcp Per   Brief Narrative:  61yom discharged 12/13, workup for suspected multiple myeloma with BM biopsy pending on discharge; 12/14 BM biopsy results available, oncology requested direct admit for aggressive inpt tx. Chemotherapy initiated 12/15. 12/16 decompensated with fever, tachycardia, hypotension; started on abx. Treated for TLS. Nephrology consulted for worsening creatinine.  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 - continue management per oncology. Chemotherapy initiated 12/15. Sp Rasburicase 12/17. Continue allopurinol. - daily uric acid levels  - Velcade per oncology   AKI, associated hyperkalemia and metabolic acidosis. complicated, multifactorial, hypotension, malignancy, TLS. - Continues worsening. Creatinine 4.44 >> 4.62 >> 5.12 >> 5.37 today, adequate UOP. K+ normal. - continue management per nephrology, appreciate recs  SIRS. Resolved. Afebrile 48 hours. CT abd/pelvis with pericholecystic fluid. LFTs unremarkable. Lactic back to normal CXR right base atelectasis vs pneumonia but no infiltrate on chest CT - remains stable, afebrile, still no evidence of infection. UC unremarkable. BC NG thus far. - no abd symptoms or RUQ pain. Transaminases likely up from hepatitis C. '[ ]'$  RUQ [name] today given LFT's elevation again - d/c cefepime and follow.    Anemia of malignancy. - s/p 2 units PRBC 12/15,  1 unit 12/17. - Hgb remains stable - follow CBC. Heparin stopped.  Thrombocytopenia - Daily CBC - Suspect secondary to chemotherapy. Management per oncology. - heparin stopped 12/18.  Hepatitis C, new dx. - f/u with GI as an outpt  Elevated transaminases - isolated, CT liver appearance unremarkable. Likely secondary to hepatitis C. Follow clinically. '[ ]'$  F/u RUQ [name]  Pathologic right femur fx s/p intramedullary nail 12/11 - WBAT per ortho. F/u end of year for suture removal. 6 weeks day ASA 325 for DVT proph per ortho (this is currently being held).  Essential HTN. - holding amlodipine.   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 Include things that cannot be auto populated i.e. Echo, Carotid and venous dopplers, Foley, Bipap, HD, tubes/drains, wound vac, central lines etc)  2 units pRBC 12/15, 1 unit pRBC 12/17  Antimicrobials: (specify start and planned stop date. Auto populated tables are space occupying and do not give end dates)  Cefepime 12/17 -> 12/19  Vancomycin 12/17 -> 12/18    Subjective: No complaints.  No abdominal pain, no CP.   Objective: Vitals:   11/29/17 0200 11/29/17 0400 11/29/17 0401 11/29/17 0600  BP: (!) 112/56 137/76  134/81  Pulse: 86 94  87  Resp: (!) 28 (!) 31  (!) 29  Temp:   98.3 F (36.8 C)   TempSrc:   Oral   SpO2: 92% (!) 89%  91%  Weight:   66 kg (145 lb 8.1 oz)   Height:        Intake/Output Summary (Last 24 hours) at 11/29/2017 0758 Last data filed at 11/29/2017 0724 Gross per 24 hour  Intake 640 ml  Output 2750 ml  Net -2110 ml   Filed Weights   11/23/17 1443 11/25/17 0500 11/29/17 0401  Weight: 65.3 kg  (144 lb) 70.1 kg (154 lb 8.7 oz) 66 kg (145 lb 8.1 oz)    Examination:  General exam: Appears calm and comfortable  Respiratory system: Clear to auscultation. Respiratory effort normal. Cardiovascular system: S1 & S2 heard, RRR. No JVD, murmurs, rubs, gallops or clicks. No pedal edema. Gastrointestinal system: Abdomen is nondistended, soft and nontender. No organomegaly or masses felt. Normal bowel sounds heard. Central nervous system: Alert and oriented. No focal neurological deficits. Extremities: 2+ pedal pulses, no LEE Skin: No rashes, lesions or ulcers Psychiatry: Judgement and insight appear normal. Mood & affect appropriate.     Data Reviewed: I have personally reviewed following labs and imaging studies  CBC: Recent Labs  Lab 11/24/17 0344 11/25/17 0352 11/26/17 0335 11/27/17 0701 11/28/17 0329 11/29/17 0258  WBC 42.4* 15.4* 8.6 10.2 11.0* 10.0  NEUTROABS 11.9* 9.9* 6.5 5.3  --  7.4  HGB 7.0* 8.1* 7.7* 9.1* 8.8* 8.8*  HCT 20.2* 23.3* 22.6* 25.9* 26.0* 25.6*  MCV 88.2 86.6 87.9 86.9 88.4 89.8  PLT 212 148* 126* 95* 98* 91*   Basic Metabolic Panel: Recent Labs  Lab 11/25/17 0352 11/26/17 0335 11/27/17 0701 11/28/17 0329 11/29/17 0258  NA 139 141 138 141 143  143  K 4.5 5.6* 4.6 4.1 4.2  4.2  CL 113* 115* 107 107 110  111  CO2 17* 18* 19* 23 21*  21*  GLUCOSE 117* 132* 109* 127* 115*  115*  BUN 49* 60* 71* 82* 82*  85*  CREATININE 3.80* 4.44* 4.62* 5.12* 5.37*  5.37*  CALCIUM 7.4* 7.0* 7.3* 7.1* 7.4*  7.5*  PHOS  --   --   --   --  5.0*   GFR: Estimated Creatinine Clearance: 13.7 mL/min (Bryam Taborda) (by C-G formula based on SCr of 5.37 mg/dL (H)). Liver Function Tests: Recent Labs  Lab 11/25/17 0352 11/26/17 0335 11/27/17 0701 11/28/17 0329 11/29/17 0258  AST 31 64* 104* 97* 168*  ALT 27 39 69* 56 52  ALKPHOS 56 54 71 69 84  BILITOT 0.5 0.9 0.7 0.5 0.9  PROT 5.6* 5.3* 5.3* 5.0* 5.4*  ALBUMIN 2.7* 2.5* 2.5* 2.3* 2.4*  2.4*   No results for  input(s): LIPASE, AMYLASE in the last 168 hours. No results for input(s): AMMONIA in the last 168 hours. Coagulation Profile: Recent Labs  Lab 11/26/17 1229  INR 1.28   Cardiac Enzymes: No results for input(s): CKTOTAL, CKMB, CKMBINDEX, TROPONINI in the last 168 hours. BNP (last 3 results) No results for input(s): PROBNP in the last 8760 hours. HbA1C: No results for input(s): HGBA1C in the last 72 hours. CBG: No results for input(s): GLUCAP in the last 168 hours. Lipid Profile: No results for input(s): CHOL, HDL, LDLCALC, TRIG, CHOLHDL, LDLDIRECT in the last 72 hours. Thyroid Function Tests: No results for input(s): TSH, T4TOTAL, FREET4, T3FREE, THYROIDAB in the last 72 hours. Anemia Panel: No results for input(s): VITAMINB12, FOLATE, FERRITIN, TIBC, IRON, RETICCTPCT in the last 72 hours. Sepsis Labs: Recent Labs  Lab 11/25/17 0813 11/25/17 1120 11/26/17 0335 11/27/17 0701  PROCALCITON  --  3.42 5.39 3.97  LATICACIDVEN 2.0* 2.2*  1.0  --     Recent Results (from the past 240 hour(s))  Surgical pcr screen     Status: None   Collection Time: 11/20/17  6:15 AM  Result Value Ref Range Status   MRSA, PCR NEGATIVE NEGATIVE Final   Staphylococcus aureus NEGATIVE NEGATIVE Final    Comment: (NOTE) The Xpert SA Assay (FDA approved for NASAL specimens in patients 29 years of age and older), is one component of [facility] comprehensive surveillance program. It is not intended to diagnose infection nor to guide or monitor treatment.   Culture, blood (Routine X 2) w Reflex to ID Panel     Status: None (Preliminary result)   Collection Time: 11/25/17  8:13 AM  Result Value Ref Range Status   Specimen Description BLOOD LEFT ANTECUBITAL  Final   Special Requests   Final    BOTTLES DRAWN AEROBIC AND ANAEROBIC Blood Culture adequate volume   Culture   Final    NO GROWTH 3 DAYS Performed at [facility], [address] [place], [place] [address]    Report Status PENDING  Incomplete    Culture, blood (Routine X 2) w Reflex to ID Panel     Status: None (Preliminary result)   Collection Time: 11/25/17  8:18 AM  Result Value Ref Range Status   Specimen Description BLOOD BLOOD LEFT FOREARM  Final   Special Requests   Final    BOTTLES DRAWN AEROBIC AND ANAEROBIC Blood Culture adequate volume   Culture   Final    NO GROWTH 3 DAYS Performed at [facility], [place] [address] [place],  [address]    Report Status PENDING  Incomplete  MRSA PCR Screening     Status: None   Collection Time: 11/25/17  9:41 AM  Result Value Ref Range Status   MRSA by PCR NEGATIVE NEGATIVE Final    Comment:        The GeneXpert MRSA Assay (FDA approved for NASAL specimens only), is one component of [facility] comprehensive MRSA colonization surveillance program. It is not intended to diagnose MRSA infection nor to guide or monitor treatment for MRSA infections.   Urine Culture     Status: Abnormal   Collection Time: 11/25/17  4:05 PM  Result Value Ref Range Status   Specimen Description URINE, RANDOM  Final   Special Requests NONE  Final   Culture (Deepak Bless)  Final    <10,000 COLONIES/mL INSIGNIFICANT GROWTH Performed at [facility], [address], [place],  [address]    Report Status 11/26/2017 FINAL  Final         Radiology Studies: No results found.      Scheduled Meds: . acyclovir  200 mg Oral Daily  . allopurinol  100 mg Oral Daily  . polyethylene glycol  17 g Oral Daily  . sodium bicarbonate  650 mg Oral TID   Continuous Infusions: . sodium chloride    . sodium chloride       LOS: 6 days    Time spent: over 30 min    Fayrene Helper, MD [facility] Pager [phone]  If 7PM-7AM, please contact night-coverage [email or web address] Password [facility] 11/29/2017, 7:58 AM

## 2017-11-29 NOTE — Progress Notes (Addendum)
Andres Fernandez   HEMATOLOGY/ONCOLOGY INPATIENT PROGRESS NOTE  Date of Service: 11/28/2017 Inpatient Attending: .Samuella Cota, MD   SUBJECTIVE  Patient was seen this evening. He notes he is feeling better.  No fevers today. Uric acid normal. He is still on [place] 2L/min. BP stable. Creatinine still increasing. UO about 2200 ml. New diagnosis of Hep C. LFT better today.  OBJECTIVE:  NAD  PHYSICAL EXAMINATION: .BP 129/77   Pulse 93   Temp 98.4 F (36.9 C) (Oral)   Resp (!) 27   Ht _0  (1.727 m)   Wt 154 lb 8.7 oz (70.1 kg)   SpO2 91%   BMI 23.50 kg/m  .  Andres Fernandez  Intake/Output Summary (Last 24 hours) at 11/29/2017 0117 Last data filed at 11/28/2017 2200 Gross per 24 hour  Intake 1935 ml  Output 2200 ml  Net -265 ml    GENERAL:alert, in no acute distress and comfortable SKIN: no acute rashes, no significant lesions EYES: conjunctiva are pink and non-injected, sclera anicteric OROPHARYNX: MMM, no exudates, no oropharyngeal erythema or ulceration NECK: supple,  JVD+ LYMPH:  no palpable lymphadenopathy in the cervical, axillary or inguinal regions LUNGS: no basal rales. HEART: regular rate & rhythm ABDOMEN:  normoactive bowel sounds , non tender, not distended. Extremity: no pedal edema PSYCH: alert & oriented x 3 with fluent speech NEURO: no focal motor/sensory deficits   MEDICAL HISTORY:  Past Medical History:  Diagnosis Date  . BPH (benign prostatic hyperplasia)   . Hepatitis C 11/27/2017  . Hypertension   . 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 Tobacco abuse     SURGICAL HISTORY: Past Surgical History:  Procedure Laterality Date  . APPENDECTOMY    . FEMUR IM NAIL Right 11/20/2017   Procedure: RIGHT FEMORAL INTRAMEDULLARY (IM) NAIL;  Surgeon: Nicholes Stairs, MD;  Location: [facility];  Service: Orthopedics;  Laterality: Right;    SOCIAL HISTORY: Social History   Socioeconomic History  . Marital status: Single    Spouse name: Not on file  . Number of  children: Not on file  . Years of education: Not on file  . Highest education level: Not on file  Social Needs  . Financial resource strain: Not on file  . Food insecurity - worry: Not on file  . Food insecurity - inability: Not on file  . Transportation needs - medical: Not on file  . Transportation needs - non-medical: Not on file  Occupational History  . Not on file  Tobacco Use  . Smoking status: Former Smoker    Packs/day: 0.50    Types: Cigarettes    Last attempt to quit: 11/09/2017    Years since quitting: 0.0  . Smokeless tobacco: Never Used  . Tobacco comment: no cigarettes x 1 week.  Substance and Sexual Activity  . Alcohol use: Yes  . Drug use: No  . Sexual activity: Not on file  Other Topics Concern  . Not on file  Social History Narrative  . Not on file    FAMILY HISTORY: Family History  Problem Relation Age of Onset  . COPD Mother   . Liver cancer Mother     ALLERGIES:  has No Known Allergies.  MEDICATIONS:  Scheduled Meds: . acyclovir  200 mg Oral Daily  . allopurinol  100 mg Oral Daily  . polyethylene glycol  17 g Oral Daily  . sodium bicarbonate  650 mg Oral TID   Continuous Infusions: . sodium chloride    . sodium chloride  PRN Meds:.acetaminophen, heparin lock flush, heparin lock flush, heparin lock flush, heparin lock flush, iopamidol, lip balm, ondansetron (ZOFRAN) IV, oxyCODONE, sodium chloride flush, sodium chloride flush, sodium chloride flush, sodium chloride flush  REVIEW OF SYSTEMS:    10 Point review of Systems was done is negative except as noted above.   LABORATORY DATA:  I have reviewed the data as listed  . CBC Latest Ref Rng & Units 11/28/2017 11/27/2017 11/26/2017  WBC 4.0 - 10.5 K/uL 11.0(H) 10.2 8.6  Hemoglobin 13.0 - 17.0 g/dL 8.8(L) 9.1(L) 7.7(L)  Hematocrit 39.0 - 52.0 % 26.0(L) 25.9(L) 22.6(L)  Platelets 150 - 400 K/uL 98(L) 95(L) 126(L)   . CMP Latest Ref Rng & Units 11/28/2017 11/27/2017 11/26/2017    Glucose 65 - 99 mg/dL 127(H) 109(H) 132(H)  BUN 6 - 20 mg/dL 82(H) 71(H) 60(H)  Creatinine 0.61 - 1.24 mg/dL 5.12(H) 4.62(H) 4.44(H)  Sodium 135 - 145 mmol/L 141 138 141  Potassium 3.5 - 5.1 mmol/L 4.1 4.6 5.6(H)  Chloride 101 - 111 mmol/L 107 107 115(H)  CO2 22 - 32 mmol/L 23 19(L) 18(L)  Calcium 8.9 - 10.3 mg/dL 7.1(L) 7.3(L) 7.0(L)  Total Protein 6.5 - 8.1 g/dL 5.0(L) 5.3(L) 5.3(L)  Total Bilirubin 0.3 - 1.2 mg/dL 0.5 0.7 0.9  Alkaline Phos 38 - 126 U/L 69 71 54  AST 15 - 41 U/L 97(H) 104(H) 64(H)  ALT 17 - 63 U/L 56 69(H) 39    Component     Latest Ref Rng & Units 11/17/2017  Prostatic Specific Antigen     0.00 - 4.00 ng/mL 0.83  HIV Screen 4th Generation wRfx     Non Reactive Non Reactive  TSH     0.350 - 4.500 uIU/mL 1.874  Beta-2 Microglobulin     0.6 - 2.4 mg/L 25.7 (H)             RADIOGRAPHIC STUDIES: I have personally reviewed the radiological images as listed and agreed with the findings in the report. Ct Abdomen Pelvis Wo Contrast  Result Date: 11/25/2017 CLINICAL DATA:  Fever of unknown origin EXAM: CT CHEST, ABDOMEN AND PELVIS WITHOUT CONTRAST TECHNIQUE: Multidetector CT imaging of the chest, abdomen and pelvis was performed following the standard protocol without IV contrast. COMPARISON:  Left lower extremity CT performed 11/17/2017. FINDINGS: CT CHEST FINDINGS Cardiovascular: Heart is borderline enlarged. Aorta is normal caliber with scattered aortic calcifications. Mediastinum/Nodes: No mediastinal, hilar, or axillary adenopathy. Lungs/Pleura: Severe centrilobular and paraseptal emphysema. Trace left pleural effusion. No confluent airspace opacities. Musculoskeletal: No acute bony abnormality. CT ABDOMEN PELVIS FINDINGS Hepatobiliary: Pericholecystic fluid present. No visible focal hepatic abnormality. Pancreas: No focal abnormality or ductal dilatation. Spleen: No focal abnormality. Normal size. Small calcification in the superior spleen.  Adrenals/Urinary Tract: No adrenal abnormality. No focal renal abnormality. No stones or hydronephrosis. Urinary bladder is unremarkable. Stomach/Bowel: Stomach, large and small bowel grossly unremarkable. Moderate stool burden in the colon. Vascular/Lymphatic: No evidence of aneurysm or adenopathy. Aortic and iliac calcifications. Reproductive: No visible focal abnormality. Other: No free fluid or free air. Musculoskeletal: Stranding noted within the subcutaneous soft tissues overlying the right lateral buttocks. There is heterogeneous material within the right gluteus muscles. These findings are likely related to recent hip surgery. Lytic destructive process noted within the left iliac bone on image 86 measuring up to 2.6 cm, unchanged since prior left lower extremity CT. IMPRESSION: Pericholecystic fluid noted. If there is clinical concern for cholecystitis, this could be further evaluated with ultrasound. Lytic destructive lesion within  the left lateral iliac bone measuring up to 2.6 cm. It is most likely reflects an area of multiple myeloma or metastasis. Intramuscular hematoma within the right gluteus muscles with subcutaneous stranding, compatible with recent surgery. Electronically Signed   By: Rolm Baptise M.D.   On: 11/25/2017 14:28   Dg Chest 2 View  Result Date: 11/17/2017 CLINICAL DATA:  61 year old male with chest pain. EXAM: CHEST  2 VIEW COMPARISON:  Chest radiograph dated 05/10/2015 FINDINGS: Mild right lung base atelectatic changes/ scarring. There is no focal consolidation, pleural effusion, or pneumothorax. The cardiac silhouette is within normal limits. There is mild degenerative changes of the spine. No acute osseous pathology. IMPRESSION: No active cardiopulmonary disease. Electronically Signed   By: Anner Crete M.D.   On: 11/17/2017 07:08   Ct Head Wo Contrast  Result Date: 11/17/2017 CLINICAL DATA:  61 year old male with numbness and tingling. Paraplegia. EXAM: CT HEAD WITHOUT  CONTRAST TECHNIQUE: Contiguous axial images were obtained from the base of the skull through the vertex without intravenous contrast. COMPARISON:  Facial bone CT dated 03/27/2010 FINDINGS: Brain: The ventricles and sulci appropriate size for patient's age. Minimal periventricular and deep white matter chronic microvascular ischemic changes noted. There is no acute intracranial hemorrhage. No mass effect or midline shift noted. No extra-axial fluid collection. Vascular: There is slight high attenuation of the right MCA in the sylvian fissure (series 7 image 12). This may be chronic. If there is clinical concern for right MCA territory ischemia further evaluation with CT angiography of the head or MRI is recommended. Skull: There are innumerable lucent calvarial lesions concerning for multiple myeloma or metastatic disease. Correlation with history of known malignancy recommended. No acute fracture. Sinuses/Orbits: There is complete opacification of the visualized right maxillary sinus. Mild mucoperiosteal thickening of paranasal sinuses. There is a lytic lesion involving the posterior right mastoid versus effusion. Other: None IMPRESSION: 1. No acute intracranial hemorrhage. 2. Slightly high attenuation of the right MCA in the right sylvian fissure which may be chronic. Further evaluation with CTA or MRI is recommended if there is high clinical concern for acute right MCA territory ischemia. 3. 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 Correlation with history of malignancy recommended. Electronically Signed   By: Anner Crete M.D.   On: 11/17/2017 06:05   Ct Chest Wo Contrast  Result Date: 11/25/2017 CLINICAL DATA:  Fever of unknown origin EXAM: CT CHEST, ABDOMEN AND PELVIS WITHOUT CONTRAST TECHNIQUE: Multidetector CT imaging of the chest, abdomen and pelvis was performed following the standard protocol without IV contrast. COMPARISON:  Left lower extremity CT performed 11/17/2017. FINDINGS:  CT CHEST FINDINGS Cardiovascular: Heart is borderline enlarged. Aorta is normal caliber with scattered aortic calcifications. Mediastinum/Nodes: No mediastinal, hilar, or axillary adenopathy. Lungs/Pleura: Severe centrilobular and paraseptal emphysema. Trace left pleural effusion. No confluent airspace opacities. Musculoskeletal: No acute bony abnormality. CT ABDOMEN PELVIS FINDINGS Hepatobiliary: Pericholecystic fluid present. No visible focal hepatic abnormality. Pancreas: No focal abnormality or ductal dilatation. Spleen: No focal abnormality. Normal size. Small calcification in the superior spleen. Adrenals/Urinary Tract: No adrenal abnormality. No focal renal abnormality. No stones or hydronephrosis. Urinary bladder is unremarkable. Stomach/Bowel: Stomach, large and small bowel grossly unremarkable. Moderate stool burden in the colon. Vascular/Lymphatic: No evidence of aneurysm or adenopathy. Aortic and iliac calcifications. Reproductive: No visible focal abnormality. Other: No free fluid or free air. Musculoskeletal: Stranding noted within the subcutaneous soft tissues overlying the right lateral buttocks. There is heterogeneous material within the right gluteus muscles. These findings  are likely related to recent hip surgery. Lytic destructive process noted within the left iliac bone on image 86 measuring up to 2.6 cm, unchanged since prior left lower extremity CT. IMPRESSION: Pericholecystic fluid noted. If there is clinical concern for cholecystitis, this could be further evaluated with ultrasound. Lytic destructive lesion within the left lateral iliac bone measuring up to 2.6 cm. It is most likely reflects an area of multiple myeloma or metastasis. Intramuscular hematoma within the right gluteus muscles with subcutaneous stranding, compatible with recent surgery. Electronically Signed   By: Rolm Baptise M.D.   On: 11/25/2017 14:28   US Renal  Result Date: 11/17/2017 CLINICAL DATA:  Acute renal injury  EXAM: RENAL / URINARY TRACT ULTRASOUND COMPLETE COMPARISON:  None. FINDINGS: Right Kidney: Length: 10.3 cm. Increased cortical echogenicity. No hydronephrosis. Than renal cortex. Left Kidney: Length: 10.2 cm. Increased cortical echogenicity. No hydronephrosis. Thinned renal cortex. Bladder: Appears normal for degree of bladder distention. IMPRESSION: 1. No hydronephrosis. 2. Increased cortical echogenicity and thinned renal cortex suggesting medical renal disease . Electronically Signed   By: Suzy Bouchard M.D.   On: 11/17/2017 08:32   Ct Femur Left Wo Contrast  Result Date: 11/18/2017 CLINICAL DATA:  Bilateral upper leg pain, worse on the right for 2 weeks. Diffuse weakness. Lytic lesions in the femurs on osseous survey. EXAM: CT OF THE LOWER LEFT EXTREMITY WITHOUT CONTRAST TECHNIQUE: Multidetector CT imaging of the lower left extremity was performed according to the standard protocol. COMPARISON:  Osseous survey 11/17/2017. FINDINGS: Bones/Joint/Cartilage No fracture is identified on the right or left. Multiple foci of endosteal scalloping are seen in the femurs bilaterally consistent with lytic lesions on plain films. Tumor burden appears fairly symmetric on the right and left. Lytic lesions are also identified in the right and left acetabuli. On the left, cortical disruption is seen adjacent to the femoral head in the anterior wall measuring 1.4 cm AP, extending 0.9 cm into bone medially and measuring 2.2 cm craniocaudal. The inner cortical wall is not disrupted. On the right, lysis extends across the medial wall of the acetabulum where there is cortical destruction adjacent to the femoral head measuring 3.5 cm AP AP by 2.0 cm craniocaudal. The inner cortical wall is not disrupted. Multiple foci of fatty marrow replacement are identified in both femurs consistent with tumor deposits. Ligaments Suboptimally assessed by CT. Muscles and Tendons Intact and normal appearance. Soft tissues No acute abnormality.  IMPRESSION: Negative for fracture. Multiple lytic lesions are present throughout both femurs with an appearance most consistent with multiple myeloma. Lytic lesions are also seen in the walls of the acetabuli bilaterally most consistent with multiple myeloma. As described above, the inner cortical wall of both the right and left acetabulum is preserved with destruction of the outer cortical wall identified. Electronically Signed   By: Inge Rise M.D.   On: 11/18/2017 10:24   Ct Femur Right Wo Contrast  Result Date: 11/18/2017 CLINICAL DATA:  Bilateral upper leg pain, worse on the right for 2 weeks. Diffuse weakness. Lytic lesions in the femurs on osseous survey. EXAM: CT OF THE LOWER LEFT EXTREMITY WITHOUT CONTRAST TECHNIQUE: Multidetector CT imaging of the lower left extremity was performed according to the standard protocol. COMPARISON:  Osseous survey 11/17/2017. FINDINGS: Bones/Joint/Cartilage No fracture is identified on the right or left. Multiple foci of endosteal scalloping are seen in the femurs bilaterally consistent with lytic lesions on plain films. Tumor burden appears fairly symmetric on the right and left. Lytic lesions are  also identified in the right and left acetabuli. On the left, cortical disruption is seen adjacent to the femoral head in the anterior wall measuring 1.4 cm AP, extending 0.9 cm into bone medially and measuring 2.2 cm craniocaudal. The inner cortical wall is not disrupted. On the right, lysis extends across the medial wall of the acetabulum where there is cortical destruction adjacent to the femoral head measuring 3.5 cm AP AP by 2.0 cm craniocaudal. The inner cortical wall is not disrupted. Multiple foci of fatty marrow replacement are identified in both femurs consistent with tumor deposits. Ligaments Suboptimally assessed by CT. Muscles and Tendons Intact and normal appearance. Soft tissues No acute abnormality. IMPRESSION: Negative for fracture. Multiple lytic lesions  are present throughout both femurs with an appearance most consistent with multiple myeloma. Lytic lesions are also seen in the walls of the acetabuli bilaterally most consistent with multiple myeloma. As described above, the inner cortical wall of both the right and left acetabulum is preserved with destruction of the outer cortical wall identified. Electronically Signed   By: Inge Rise M.D.   On: 11/18/2017 10:24   Dg Chest Port 1 View  Result Date: 11/26/2017 CLINICAL DATA:  61 year old male with shortness of breath and bibasilar crackles EXAM: PORTABLE CHEST 1 VIEW COMPARISON:  11/25/2017 CT, 11/17/2017 chest radiograph and prior studies. FINDINGS: Upper limits normal heart size noted. Pulmonary vascular congestion and interstitial opacities are present likely representing interstitial edema. Increased opacity at the right lung base may represent atelectasis, focal edema or possibly superimposed pneumonia. A lytic lesion within the anterolateral left eighth rib is noted. IMPRESSION: 1. Pulmonary vascular congestion and probable interstitial pulmonary edema. 2. Right basilar opacity which may represent atelectasis, focal edema or possibly superimposed pneumonia. 3. Left eighth rib lytic lesion -question multiple myeloma or metastatic disease. Electronically Signed   By: Margarette Canada M.D.   On: 11/26/2017 11:08   Dg Bone Survey Met  Result Date: 11/17/2017 CLINICAL DATA:  He is complaining of numbness in his distal arms and legs which has been present for the last 2 weeks. It is been generally getting worse. He also has noted a painful knot in the right rib cage. He denies fever, chills, sweats.*comment was truncated* in a have a EXAM: METASTATIC BONE SURVEY COMPARISON:  Head -CT 11/17/2017 FINDINGS: Skull: Multiple lytic lesions throughout the calvarium. Lytic lesions scalloped the cortex of the LEFT and RIGHT humeri and clavicles. Lytic lesions in the radius and ulna of the forearms Lytic lesions  scalloping enter cortex of the LEFT RIGHT femur. Lytic lesions within the fibulae. IMPRESSION: 1. Lytic lesions scalloping the inner cortex of the axillary and appendicular skeleton including the skull, clavicles, humeri, femurs and distal long bones. 2. Findings most suggestive multiple myeloma. Electronically Signed   By: Suzy Bouchard M.D.   On: 11/17/2017 09:50   Ct Bone Marrow Biopsy & Aspiration  Result Date: 11/20/2017 INDICATION: 61 year old male with a history of multiple myeloma EXAM: CT BONE MARROW BIOPSY AND ASPIRATION MEDICATIONS: None. ANESTHESIA/SEDATION: Moderate (conscious) sedation was employed during this procedure. A total of Versed 2.0 mg and Fentanyl 2 [place] mcg was administered intravenously. Moderate Sedation Time: 11 minutes. The patient's level of consciousness and vital signs were monitored continuously by radiology nursing throughout the procedure under my direct supervision. FLUOROSCOPY TIME:  CT COMPLICATIONS: None PROCEDURE: The procedure risks, benefits, and alternatives were explained to the patient. Questions regarding the procedure were encouraged and answered. The patient understands and consents to the  procedure. Scout CT of the pelvis was performed for surgical planning purposes. The posterior pelvis was prepped with chlorhexidinein a sterile fashion, and a sterile drape was applied covering the operative field. A sterile gown and sterile gloves were used for the procedure. Local anesthesia was provided with 1% Lidocaine. We targeted the left posterior iliac bone for biopsy. The skin and subcutaneous tissues were infiltrated with 1% lidocaine without epinephrine. A small stab incision was made with an 11 blade scalpel, and an 11 gauge Murphy needle was advanced with CT guidance to the posterior cortex. Manual forced was used to advance the needle through the posterior cortex and the stylet was removed. A bone marrow aspirate was retrieved and passed to a  cytotechnologist in the room. The Murphy needle was then advanced without the stylet for a core biopsy. The core biopsy was retrieved and also passed to a cytotechnologist. Manual pressure was used for hemostasis and a sterile dressing was placed. No complications were encountered no significant blood loss was encountered. Patient tolerated the procedure well and remained hemodynamically stable throughout. IMPRESSION: Status post CT-guided bone marrow biopsy, with tissue specimen sent to pathology for complete histopathologic analysis Signed, Dulcy Fanny. Earleen Newport, DO [facility] Electronically Signed   By: Corrie Mckusick D.O.   On: 11/20/2017 10:06   Dg C-arm 1-60 Min  Result Date: 11/20/2017 CLINICAL DATA:  Status post inter medullary nail and screw placement for impending right femur fracture due to metastatic disease. Multiple myeloma. EXAM: RIGHT FEMUR PORTABLE 2 VIEW; DG C-ARM 61-120 MIN COMPARISON:  CT scan of the femurs of November 17, 2017 FINDINGS: Fluoro time reported is 1 minutes, 24 seconds. Two fluoro spot images are observed. An intramedullary rod within the femoral shaft present. There are 2 telescoping screws in the right femoral neck and head and securing screw in the lower right femoral shaft. There is no immediate postprocedure complication. IMPRESSION: No immediate postprocedure complication following intramedullary nail placement of the right femur. Electronically Signed   By: David  Martinique M.D.   On: 11/20/2017 13:41   Dg Femur Port, Min 2 Views Right  Result Date: 11/20/2017 CLINICAL DATA:  Status post inter medullary nail and screw placement for impending right femur fracture due to metastatic disease. Multiple myeloma. EXAM: RIGHT FEMUR PORTABLE 2 VIEW; DG C-ARM 61-120 MIN COMPARISON:  CT scan of the femurs of November 17, 2017 FINDINGS: Fluoro time reported is 1 minutes, 24 seconds. Two fluoro spot images are observed. An  intramedullary rod within the femoral shaft present. There are 2 telescoping screws in the right femoral neck and head and securing screw in the lower right femoral shaft. There is no immediate postprocedure complication. IMPRESSION: No immediate postprocedure complication following intramedullary nail placement of the right femur. Electronically Signed   By: David  Martinique M.D.   On: 11/20/2017 13:41    ASSESSMENT & PLAN:   61 y.o. AA male presenting with severe hypercalcemia, skeletal findings consistent with myeloma and significant leukocytosis/lymphocytosis in the peripheral bloodas well as AKI and hypercalcemia.  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  Prelim- BM Bx showed >95% involvement with kappa restricted serum free light chains. 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  Lymphocytosis/leukocytosis resolved with high-dose steroids and Velcade. 2) s/p Prophylactic IM nailing for rt femur. Plan -will continue  rx with Velcade + Dexamethasone. -holding off Cyclophosphamide in the setting of sepsis and new diagnosis of Hep C. -continue Acyclovir prophylaxis.  3)TLS - uric acid  Improved. Plan -continue Allopurinol -monitor daily Uric acid. -prn additional [place] if uric acd increasing.  4) h/o tobacco abuse  5) h/o cocaine and ETOH abuse -- sober for 3 yrs  6) AKI vs ARF on CKD -- due to myeloma.  (Creatinine higher today) - multifactorial MM/TLS/sepsis/antibiotics  .Plan -nephrology consulting. Creatinine still increasing. 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 Some drop in hgb due to rt gluteal hematoma Plan hgb stable at 8.8 -prn PRBC transfusion for HGB <8  10) Hyperkalemia due to some TLS + renal  insufficiency K resolved ot 4.6 -monitoring   11) SIRS PLAN -Mx per hospitalist  6) multiple myeloma related skeletal pain -Oxycodone 5-10 mg every 6 hours as needed -prn IV dilaudid given renal insuff with risk of accumulation or morphine metabolites -Senna S for bowel prophylaxis  7) Elevated aPTT nl fibrinogen -likely coagulopathy due to paraproteinemia. -rx myeloma  8) newly diagnosed Hep C -will need Hep C clinic referral on discharge  9) Thrombocytopenia due to Vlecade -stable.  I shall continue to follow daily. Appreciate excellent hospital medicine cares.   I spent 30 minutes counseling the patient face to face. The total time spent in the appointment was 40 minutes and more than 50% was on counseling and direct patient cares.    Sullivan Lone MD [place] AAHIVMS [facility] Hematology/Oncology Physician [facility]  (Office):       [phone] (Work cell):  [phone] (Fax):           [phone]

## 2017-11-30 ENCOUNTER — Inpatient Hospital Stay (HOSPITAL_COMMUNITY): Payer: BLUE CROSS/BLUE SHIELD

## 2017-11-30 LAB — CULTURE, BLOOD (ROUTINE X 2)
CULTURE: NO GROWTH
Culture: NO GROWTH
SPECIAL REQUESTS: ADEQUATE
Special Requests: ADEQUATE

## 2017-11-30 LAB — CBC
HCT: 24.9 % — ABNORMAL LOW (ref 39.0–52.0)
HEMOGLOBIN: 8.5 g/dL — AB (ref 13.0–17.0)
MCH: 31 pg (ref 26.0–34.0)
MCHC: 34.1 g/dL (ref 30.0–36.0)
MCV: 90.9 fL (ref 78.0–100.0)
Platelets: 66 10*3/uL — ABNORMAL LOW (ref 150–400)
RBC: 2.74 MIL/uL — AB (ref 4.22–5.81)
RDW: 15.6 % — ABNORMAL HIGH (ref 11.5–15.5)
WBC: 9.5 10*3/uL (ref 4.0–10.5)

## 2017-11-30 LAB — COMPREHENSIVE METABOLIC PANEL
ALT: 43 U/L (ref 17–63)
ANION GAP: 12 (ref 5–15)
AST: 100 U/L — ABNORMAL HIGH (ref 15–41)
Albumin: 2.5 g/dL — ABNORMAL LOW (ref 3.5–5.0)
Alkaline Phosphatase: 71 U/L (ref 38–126)
BUN: 78 mg/dL — ABNORMAL HIGH (ref 6–20)
CHLORIDE: 115 mmol/L — AB (ref 101–111)
CO2: 19 mmol/L — AB (ref 22–32)
CREATININE: 4.89 mg/dL — AB (ref 0.61–1.24)
Calcium: 8 mg/dL — ABNORMAL LOW (ref 8.9–10.3)
GFR calc non Af Amer: 12 mL/min — ABNORMAL LOW (ref >60)
GFR, EST AFRICAN AMERICAN: 14 mL/min — AB (ref >60)
Glucose, Bld: 111 mg/dL — ABNORMAL HIGH (ref 65–99)
Potassium: 4.4 mmol/L (ref 3.5–5.1)
SODIUM: 146 mmol/L — AB (ref 135–145)
Total Bilirubin: 0.7 mg/dL (ref 0.3–1.2)
Total Protein: 5.9 g/dL — ABNORMAL LOW (ref 6.5–8.1)

## 2017-11-30 LAB — URIC ACID: URIC ACID, SERUM: 0.7 mg/dL — AB (ref 4.4–7.6)

## 2017-11-30 MED ORDER — FUROSEMIDE 10 MG/ML IJ SOLN
80.0000 mg | Freq: Once | INTRAMUSCULAR | Status: AC
  Administered 2017-11-30: 80 mg via INTRAVENOUS
  Filled 2017-11-30: qty 8

## 2017-11-30 MED ORDER — ENSURE ENLIVE PO LIQD
237.0000 mL | Freq: Two times a day (BID) | ORAL | Status: DC
  Administered 2017-12-01 – 2017-12-06 (×7): 237 mL via ORAL

## 2017-11-30 NOTE — Progress Notes (Addendum)
Physical Therapy Treatment Patient Details Name: Andres Fernandez MRN: [id number] DOB: 1956-07-11 Today's Date: 11/30/2017    History of Present Illness 61 yo male admitted with fever, tachycardia. hx of multiple myeloma, drug abuse, R femur IM nail 11/20/17    PT Comments    The patient is noted to be SOB with noted DOE, Oxygen Saturation  Prior to ambulation = > 98% on RA, during ambulation the saturation waveform was not picking up clearly but numbers would register in high 70-low 80%. Respirations labored with patient only able to ambulate 40' x 2 . After return to bed the saturation had good wave and registered  At 80-82%  and did not improve. Place O2 at 3 liters with gradual increase to 90%. Pam RN in room and aware of oxygen saturation and will notify MD.   As  Noted in PT notes of 12/17 The patient ambulated x 100' on RA  Without a RW. Today's performance is much different.  Follow Up Recommendations  Home health PT; vs snf and needs 24/7 assistance based on treatment today.     Equipment Recommendations  None recommended by PT    Recommendations for Other Services       Precautions / Restrictions Precautions Precautions: Fall Precaution Comments: monitor oxygen, may need O2 Restrictions RLE Weight Bearing: Weight bearing as tolerated    Mobility  Bed Mobility   Bed Mobility: Sit to Supine       Sit to supine: Min guard   General bed mobility comments: able to self assist.  Transfers Overall transfer level: Needs assistance Equipment used: Rolling walker (2 wheeled) Transfers: Sit to/from Stand Sit to Stand: Min assist         General transfer comment: close guard for safety.   Ambulation/Gait Ambulation/Gait assistance: Min assist;+2 safety/equipment Ambulation Distance (Feet): 40 Feet(x 2) Assistive device: Rolling walker (2 wheeled) Gait Pattern/deviations: Step-to pattern;Step-through pattern;Shuffle;Trunk flexed;Decreased stride length Gait  velocity: Decreased   General Gait Details: the patient is noted to be ambulating very slowly, appears to have SOB. Oxygen saturation was not picking up well but h numbers showing 78-84%( poor waveform), HR in 90's   Stairs            Wheelchair Mobility    Modified Rankin (Stroke Patients Only)       Balance                                            Cognition Arousal/Alertness: Awake/alert                                            Exercises      General Comments        Pertinent Vitals/Pain Faces Pain Scale: Hurts little more Pain Location: R LE Pain Descriptors / Indicators: Discomfort Pain Intervention(s): Monitored during session;Premedicated before session    Home Living Family/patient expects to be discharged to:: Unsure Living Arrangements: Alone           Home Equipment: Walker - 2 wheels      Prior Function Level of Independence: Independent          PT Goals (current goals can now be found in the care plan section) Progress towards PT goals: Not progressing toward  goals - comment(the patient was  SOB and saturation low/)    Frequency    Min 3X/week      PT Plan Current plan remains appropriate    Co-evaluation              AM-PAC PT "6 Clicks" Daily Activity  Outcome Measure  Difficulty turning over in bed (including adjusting bedclothes, sheets and blankets)?: None Difficulty moving from lying on back to sitting on the side of the bed? : None Difficulty sitting down on and standing up from a chair with arms (e.g., wheelchair, bedside commode, etc,.)?: A Little Help needed moving to and from a bed to chair (including a wheelchair)?: A Little Help needed walking in hospital room?: A Lot Help needed climbing 3-5 steps with a railing? : A Lot 6 Click Score: 18    End of Session   Activity Tolerance: Patient limited by fatigue;Treatment limited secondary to medical complications  (Comment) Patient left: in bed;with call bell/phone within reach;with bed alarm set Nurse Communication: Mobility status PT Visit Diagnosis: Muscle weakness (generalized) (M62.81);Pain;Difficulty in walking, not elsewhere classified (R26.2) Pain - Right/Left: Right Pain - part of body: Leg     Time: 7544-9201 PT Time Calculation (min) (ACUTE ONLY): 36 min  Charges:  $Gait Training: 8-22 mins                    G CodesTresa Endo PT [id number]    [name] 11/30/2017, 1:03 PM

## 2017-11-30 NOTE — Progress Notes (Signed)
[name]   HEMATOLOGY/ONCOLOGY INPATIENT PROGRESS NOTE  Date of Service: 11/29/2017 Inpatient Attending: .Elodia Florence., *  SUBJECTIVE  Patient was seen late this evening. He notes he is feeling better.  No fevers today. Uric acid normal. BP stable. Creatinine still increasing.   New diagnosis of Hep C.  With patient's permission this diagnosis was discussed with his fiance and she was recommended testing for hepatitis C with her primary care physician as well. I discussed his current medical status with his sister over the phone at his request.   OBJECTIVE:   NAD PHYSICAL EXAMINATION: VS reviewed GENERAL:alert, in no acute distress and comfortable SKIN: no acute rashes, no significant lesions EYES: conjunctiva are pink and non-injected, sclera anicteric OROPHARYNX: MMM, no exudates, no oropharyngeal erythema or ulceration NECK: supple,  JVD mild we connected about that medication he gave me like that told him LYMPH:  no palpable lymphadenopathy in the cervical, axillary or inguinal regions LUNGS: no basal rales. HEART: regular rate & rhythm ABDOMEN:  normoactive bowel sounds , non tender, not distended. Extremity: no pedal edema PSYCH: alert & oriented x 3 with fluent speech NEURO: no focal motor/sensory deficits   MEDICAL HISTORY:  Past Medical History:  Diagnosis Date  . BPH (benign prostatic hyperplasia)   . Hepatitis C 11/27/2017  . Hypertension   . 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 Tobacco abuse     SURGICAL HISTORY: Past Surgical History:  Procedure Laterality Date  . APPENDECTOMY    . FEMUR IM NAIL Right 11/20/2017   Procedure: RIGHT FEMORAL INTRAMEDULLARY (IM) NAIL;  Surgeon: Nicholes Stairs, MD;  Location: [facility];  Service: Orthopedics;  Laterality: Right;    SOCIAL HISTORY: Social History   Socioeconomic History  . Marital status: Single    Spouse name: Not on file  . Number of children: Not on file  . Years of education: Not on  file  . Highest education level: Not on file  Social Needs  . Financial resource strain: Not on file  . Food insecurity - worry: Not on file  . Food insecurity - inability: Not on file  . Transportation needs - medical: Not on file  . Transportation needs - non-medical: Not on file  Occupational History  . Not on file  Tobacco Use  . Smoking status: Former Smoker    Packs/day: 0.50    Types: Cigarettes    Last attempt to quit: 11/09/2017    Years since quitting: 0.0  . Smokeless tobacco: Never Used  . Tobacco comment: no cigarettes x 1 week.  Substance and Sexual Activity  . Alcohol use: Yes  . Drug use: No  . Sexual activity: Not on file  Other Topics Concern  . Not on file  Social History Narrative  . Not on file    FAMILY HISTORY: Family History  Problem Relation Age of Onset  . COPD Mother   . Liver cancer Mother     ALLERGIES:  has No Known Allergies.  MEDICATIONS:  Scheduled Meds: . acyclovir  200 mg Oral Daily  . allopurinol  100 mg Oral Daily  . polyethylene glycol  17 g Oral Daily  . sodium bicarbonate  650 mg Oral TID   Continuous Infusions: . sodium chloride    . sodium chloride     PRN Meds:.acetaminophen, heparin lock flush, heparin lock flush, heparin lock flush, heparin lock flush, iopamidol, lip balm, ondansetron (ZOFRAN) IV, oxyCODONE, sodium chloride flush, sodium chloride flush, sodium chloride flush, sodium  chloride flush  REVIEW OF SYSTEMS:    10 Point review of Systems was done is negative except as noted above.   LABORATORY DATA:  I have reviewed the data as listed  . CBC Latest Ref Rng & Units 11/29/2017 11/28/2017  WBC 4.0 - 10.5 K/uL 10.0 11.0(H)  Hemoglobin 13.0 - 17.0 g/dL 8.8(L) 8.8(L)  Hematocrit 39.0 - 52.0 % 25.6(L) 26.0(L)  Platelets 150 - 400 K/uL 91(L) 98(L)   . CMP Latest Ref Rng & Units 11/29/2017 11/29/2017  Glucose 65 - 99 mg/dL 115(H) 115(H)  BUN 6 - 20 mg/dL 82(H) 85(H)  Creatinine 0.61 - 1.24 mg/dL 5.37(H)  5.37(H)  Sodium 135 - 145 mmol/L 143 143  Potassium 3.5 - 5.1 mmol/L 4.2 4.2  Chloride 101 - 111 mmol/L 110 111  CO2 22 - 32 mmol/L 21(L) 21(L)  Calcium 8.9 - 10.3 mg/dL 7.4(L) 7.5(L)  Total Protein 6.5 - 8.1 g/dL - 5.4(L)  Total Bilirubin 0.3 - 1.2 mg/dL - 0.9  Alkaline Phos 38 - 126 U/L - 84  AST 15 - 41 U/L - 168(H)  ALT 17 - 63 U/L - 52    Component     Latest Ref Rng & Units 11/17/2017  Prostatic Specific Antigen     0.00 - 4.00 ng/mL 0.83  HIV Screen 4th Generation wRfx     Non Reactive Non Reactive  TSH     0.350 - 4.500 uIU/mL 1.874  Beta-2 Microglobulin     0.6 - 2.4 mg/L 25.7 (H)             RADIOGRAPHIC STUDIES: I have personally reviewed the radiological images as listed and agreed with the findings in the report. Ct Abdomen Pelvis Wo Contrast  Result Date: 11/25/2017 CLINICAL DATA:  Fever of unknown origin EXAM: CT CHEST, ABDOMEN AND PELVIS WITHOUT CONTRAST TECHNIQUE: Multidetector CT imaging of the chest, abdomen and pelvis was performed following the standard protocol without IV contrast. COMPARISON:  Left lower extremity CT performed 11/17/2017. FINDINGS: CT CHEST FINDINGS Cardiovascular: Heart is borderline enlarged. Aorta is normal caliber with scattered aortic calcifications. Mediastinum/Nodes: No mediastinal, hilar, or axillary adenopathy. Lungs/Pleura: Severe centrilobular and paraseptal emphysema. Trace left pleural effusion. No confluent airspace opacities. Musculoskeletal: No acute bony abnormality. CT ABDOMEN PELVIS FINDINGS Hepatobiliary: Pericholecystic fluid present. No visible focal hepatic abnormality. Pancreas: No focal abnormality or ductal dilatation. Spleen: No focal abnormality. Normal size. Small calcification in the superior spleen. Adrenals/Urinary Tract: No adrenal abnormality. No focal renal abnormality. No stones or hydronephrosis. Urinary bladder is unremarkable. Stomach/Bowel: Stomach, large and small bowel grossly unremarkable.  Moderate stool burden in the colon. Vascular/Lymphatic: No evidence of aneurysm or adenopathy. Aortic and iliac calcifications. Reproductive: No visible focal abnormality. Other: No free fluid or free air. Musculoskeletal: Stranding noted within the subcutaneous soft tissues overlying the right lateral buttocks. There is heterogeneous material within the right gluteus muscles. These findings are likely related to recent hip surgery. Lytic destructive process noted within the left iliac bone on image 86 measuring up to 2.6 cm, unchanged since prior left lower extremity CT. IMPRESSION: Pericholecystic fluid noted. If there is clinical concern for cholecystitis, this could be further evaluated with ultrasound. Lytic destructive lesion within the left lateral iliac bone measuring up to 2.6 cm. It is most likely reflects an area of multiple myeloma or metastasis. Intramuscular hematoma within the right gluteus muscles with subcutaneous stranding, compatible with recent surgery. Electronically Signed   By: Rolm Baptise M.D.   On: 11/25/2017 14:28  Dg Chest 2 View  Result Date: 11/17/2017 CLINICAL DATA:  61 year old male with chest pain. EXAM: CHEST  2 VIEW COMPARISON:  Chest radiograph dated 05/10/2015 FINDINGS: Mild right lung base atelectatic changes/ scarring. There is no focal consolidation, pleural effusion, or pneumothorax. The cardiac silhouette is within normal limits. There is mild degenerative changes of the spine. No acute osseous pathology. IMPRESSION: No active cardiopulmonary disease. Electronically Signed   By: Anner Crete M.D.   On: 11/17/2017 07:08   Ct Head Wo Contrast  Result Date: 11/17/2017 CLINICAL DATA:  61 year old male with numbness and tingling. Paraplegia. EXAM: CT HEAD WITHOUT CONTRAST TECHNIQUE: Contiguous axial images were obtained from the base of the skull through the vertex without intravenous contrast. COMPARISON:  Facial bone CT dated 03/27/2010 FINDINGS: Brain: The  ventricles and sulci appropriate size for patient's age. Minimal periventricular and deep white matter chronic microvascular ischemic changes noted. There is no acute intracranial hemorrhage. No mass effect or midline shift noted. No extra-axial fluid collection. Vascular: There is slight high attenuation of the right MCA in the sylvian fissure (series 7 image 12). This may be chronic. If there is clinical concern for right MCA territory ischemia further evaluation with CT angiography of the head or MRI is recommended. Skull: There are innumerable lucent calvarial lesions concerning for multiple myeloma or metastatic disease. Correlation with history of known malignancy recommended. No acute fracture. Sinuses/Orbits: There is complete opacification of the visualized right maxillary sinus. Mild mucoperiosteal thickening of paranasal sinuses. There is a lytic lesion involving the posterior right mastoid versus effusion. Other: None IMPRESSION: 1. No acute intracranial hemorrhage. 2. Slightly high attenuation of the right MCA in the right sylvian fissure which may be chronic. Further evaluation with CTA or MRI is recommended if there is high clinical concern for acute right MCA territory ischemia. 3. 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 Correlation with history of malignancy recommended. Electronically Signed   By: Anner Crete M.D.   On: 11/17/2017 06:05   Ct Chest Wo Contrast  Result Date: 11/25/2017 CLINICAL DATA:  Fever of unknown origin EXAM: CT CHEST, ABDOMEN AND PELVIS WITHOUT CONTRAST TECHNIQUE: Multidetector CT imaging of the chest, abdomen and pelvis was performed following the standard protocol without IV contrast. COMPARISON:  Left lower extremity CT performed 11/17/2017. FINDINGS: CT CHEST FINDINGS Cardiovascular: Heart is borderline enlarged. Aorta is normal caliber with scattered aortic calcifications. Mediastinum/Nodes: No mediastinal, hilar, or axillary adenopathy.  Lungs/Pleura: Severe centrilobular and paraseptal emphysema. Trace left pleural effusion. No confluent airspace opacities. Musculoskeletal: No acute bony abnormality. CT ABDOMEN PELVIS FINDINGS Hepatobiliary: Pericholecystic fluid present. No visible focal hepatic abnormality. Pancreas: No focal abnormality or ductal dilatation. Spleen: No focal abnormality. Normal size. Small calcification in the superior spleen. Adrenals/Urinary Tract: No adrenal abnormality. No focal renal abnormality. No stones or hydronephrosis. Urinary bladder is unremarkable. Stomach/Bowel: Stomach, large and small bowel grossly unremarkable. Moderate stool burden in the colon. Vascular/Lymphatic: No evidence of aneurysm or adenopathy. Aortic and iliac calcifications. Reproductive: No visible focal abnormality. Other: No free fluid or free air. Musculoskeletal: Stranding noted within the subcutaneous soft tissues overlying the right lateral buttocks. There is heterogeneous material within the right gluteus muscles. These findings are likely related to recent hip surgery. Lytic destructive process noted within the left iliac bone on image 86 measuring up to 2.6 cm, unchanged since prior left lower extremity CT. IMPRESSION: Pericholecystic fluid noted. If there is clinical concern for cholecystitis, this could be further evaluated with ultrasound. Lytic destructive lesion  within the left lateral iliac bone measuring up to 2.6 cm. It is most likely reflects an area of multiple myeloma or metastasis. Intramuscular hematoma within the right gluteus muscles with subcutaneous stranding, compatible with recent surgery. Electronically Signed   By: Rolm Baptise M.D.   On: 11/25/2017 14:28   US Renal  Result Date: 11/17/2017 CLINICAL DATA:  Acute renal injury EXAM: RENAL / URINARY TRACT ULTRASOUND COMPLETE COMPARISON:  None. FINDINGS: Right Kidney: Length: 10.3 cm. Increased cortical echogenicity. No hydronephrosis. Than renal cortex. Left Kidney:  Length: 10.2 cm. Increased cortical echogenicity. No hydronephrosis. Thinned renal cortex. Bladder: Appears normal for degree of bladder distention. IMPRESSION: 1. No hydronephrosis. 2. Increased cortical echogenicity and thinned renal cortex suggesting medical renal disease . Electronically Signed   By: Suzy Bouchard M.D.   On: 11/17/2017 08:32   Ct Femur Left Wo Contrast  Result Date: 11/18/2017 CLINICAL DATA:  Bilateral upper leg pain, worse on the right for 2 weeks. Diffuse weakness. Lytic lesions in the femurs on osseous survey. EXAM: CT OF THE LOWER LEFT EXTREMITY WITHOUT CONTRAST TECHNIQUE: Multidetector CT imaging of the lower left extremity was performed according to the standard protocol. COMPARISON:  Osseous survey 11/17/2017. FINDINGS: Bones/Joint/Cartilage No fracture is identified on the right or left. Multiple foci of endosteal scalloping are seen in the femurs bilaterally consistent with lytic lesions on plain films. Tumor burden appears fairly symmetric on the right and left. Lytic lesions are also identified in the right and left acetabuli. On the left, cortical disruption is seen adjacent to the femoral head in the anterior wall measuring 1.4 cm AP, extending 0.9 cm into bone medially and measuring 2.2 cm craniocaudal. The inner cortical wall is not disrupted. On the right, lysis extends across the medial wall of the acetabulum where there is cortical destruction adjacent to the femoral head measuring 3.5 cm AP AP by 2.0 cm craniocaudal. The inner cortical wall is not disrupted. Multiple foci of fatty marrow replacement are identified in both femurs consistent with tumor deposits. Ligaments Suboptimally assessed by CT. Muscles and Tendons Intact and normal appearance. Soft tissues No acute abnormality. IMPRESSION: Negative for fracture. Multiple lytic lesions are present throughout both femurs with an appearance most consistent with multiple myeloma. Lytic lesions are also seen in the walls  of the acetabuli bilaterally most consistent with multiple myeloma. As described above, the inner cortical wall of both the right and left acetabulum is preserved with destruction of the outer cortical wall identified. Electronically Signed   By: Inge Rise M.D.   On: 11/18/2017 10:24   Ct Femur Right Wo Contrast  Result Date: 11/18/2017 CLINICAL DATA:  Bilateral upper leg pain, worse on the right for 2 weeks. Diffuse weakness. Lytic lesions in the femurs on osseous survey. EXAM: CT OF THE LOWER LEFT EXTREMITY WITHOUT CONTRAST TECHNIQUE: Multidetector CT imaging of the lower left extremity was performed according to the standard protocol. COMPARISON:  Osseous survey 11/17/2017. FINDINGS: Bones/Joint/Cartilage No fracture is identified on the right or left. Multiple foci of endosteal scalloping are seen in the femurs bilaterally consistent with lytic lesions on plain films. Tumor burden appears fairly symmetric on the right and left. Lytic lesions are also identified in the right and left acetabuli. On the left, cortical disruption is seen adjacent to the femoral head in the anterior wall measuring 1.4 cm AP, extending 0.9 cm into bone medially and measuring 2.2 cm craniocaudal. The inner cortical wall is not disrupted. On the right, lysis extends across  the medial wall of the acetabulum where there is cortical destruction adjacent to the femoral head measuring 3.5 cm AP AP by 2.0 cm craniocaudal. The inner cortical wall is not disrupted. Multiple foci of fatty marrow replacement are identified in both femurs consistent with tumor deposits. Ligaments Suboptimally assessed by CT. Muscles and Tendons Intact and normal appearance. Soft tissues No acute abnormality. IMPRESSION: Negative for fracture. Multiple lytic lesions are present throughout both femurs with an appearance most consistent with multiple myeloma. Lytic lesions are also seen in the walls of the acetabuli bilaterally most consistent with  multiple myeloma. As described above, the inner cortical wall of both the right and left acetabulum is preserved with destruction of the outer cortical wall identified. Electronically Signed   By: Inge Rise M.D.   On: 11/18/2017 10:24   Dg Chest Port 1 View  Result Date: 11/26/2017 CLINICAL DATA:  61 year old male with shortness of breath and bibasilar crackles EXAM: PORTABLE CHEST 1 VIEW COMPARISON:  11/25/2017 CT, 11/17/2017 chest radiograph and prior studies. FINDINGS: Upper limits normal heart size noted. Pulmonary vascular congestion and interstitial opacities are present likely representing interstitial edema. Increased opacity at the right lung base may represent atelectasis, focal edema or possibly superimposed pneumonia. A lytic lesion within the anterolateral left eighth rib is noted. IMPRESSION: 1. Pulmonary vascular congestion and probable interstitial pulmonary edema. 2. Right basilar opacity which may represent atelectasis, focal edema or possibly superimposed pneumonia. 3. Left eighth rib lytic lesion -question multiple myeloma or metastatic disease. Electronically Signed   By: Margarette Canada M.D.   On: 11/26/2017 11:08   Dg Bone Survey Met  Result Date: 11/17/2017 CLINICAL DATA:  He is complaining of numbness in his distal arms and legs which has been present for the last 2 weeks. It is been generally getting worse. He also has noted a painful knot in the right rib cage. He denies fever, chills, sweats.*comment was truncated* in a have a EXAM: METASTATIC BONE SURVEY COMPARISON:  Head -CT 11/17/2017 FINDINGS: Skull: Multiple lytic lesions throughout the calvarium. Lytic lesions scalloped the cortex of the LEFT and RIGHT humeri and clavicles. Lytic lesions in the radius and ulna of the forearms Lytic lesions scalloping enter cortex of the LEFT RIGHT femur. Lytic lesions within the fibulae. IMPRESSION: 1. Lytic lesions scalloping the inner cortex of the axillary and appendicular skeleton  including the skull, clavicles, humeri, femurs and distal long bones. 2. Findings most suggestive multiple myeloma. Electronically Signed   By: Suzy Bouchard M.D.   On: 11/17/2017 09:50   Ct Bone Marrow Biopsy & Aspiration  Result Date: 11/20/2017 INDICATION: 61 year old male with a history of multiple myeloma EXAM: CT BONE MARROW BIOPSY AND ASPIRATION MEDICATIONS: None. ANESTHESIA/SEDATION: Moderate (conscious) sedation was employed during this procedure. A total of Versed 2.0 mg and Fentanyl 2 Hunter mcg was administered intravenously. Moderate Sedation Time: 11 minutes. The patient's level of consciousness and vital signs were monitored continuously by radiology nursing throughout the procedure under my direct supervision. FLUOROSCOPY TIME:  CT COMPLICATIONS: None PROCEDURE: The procedure risks, benefits, and alternatives were explained to the patient. Questions regarding the procedure were encouraged and answered. The patient understands and consents to the procedure. Scout CT of the pelvis was performed for surgical planning purposes. The posterior pelvis was prepped with chlorhexidinein a sterile fashion, and a sterile drape was applied covering the operative field. A sterile gown and sterile gloves were used for the procedure. Local anesthesia was provided with 1% Lidocaine. We targeted  the left posterior iliac bone for biopsy. The skin and subcutaneous tissues were infiltrated with 1% lidocaine without epinephrine. A small stab incision was made with an 11 blade scalpel, and an 11 gauge Murphy needle was advanced with CT guidance to the posterior cortex. Manual forced was used to advance the needle through the posterior cortex and the stylet was removed. A bone marrow aspirate was retrieved and passed to a cytotechnologist in the room. The Murphy needle was then advanced without the stylet for a core biopsy. The core biopsy was retrieved and also passed to a cytotechnologist. Manual pressure was used  for hemostasis and a sterile dressing was placed. No complications were encountered no significant blood loss was encountered. Patient tolerated the procedure well and remained hemodynamically stable throughout. IMPRESSION: Status post CT-guided bone marrow biopsy, with tissue specimen sent to pathology for complete histopathologic analysis Signed, Dulcy Fanny. Earleen Newport, DO [facility] Electronically Signed   By: Corrie Mckusick D.O.   On: 11/20/2017 10:06   Dg C-arm 1-60 Min  Result Date: 11/20/2017 CLINICAL DATA:  Status post inter medullary nail and screw placement for impending right femur fracture due to metastatic disease. Multiple myeloma. EXAM: RIGHT FEMUR PORTABLE 2 VIEW; DG C-ARM 61-120 MIN COMPARISON:  CT scan of the femurs of November 17, 2017 FINDINGS: Fluoro time reported is 1 minutes, 24 seconds. Two fluoro spot images are observed. An intramedullary rod within the femoral shaft present. There are 2 telescoping screws in the right femoral neck and head and securing screw in the lower right femoral shaft. There is no immediate postprocedure complication. IMPRESSION: No immediate postprocedure complication following intramedullary nail placement of the right femur. Electronically Signed   By: David  Martinique M.D.   On: 11/20/2017 13:41   Dg Femur Port, Min 2 Views Right  Result Date: 11/20/2017 CLINICAL DATA:  Status post inter medullary nail and screw placement for impending right femur fracture due to metastatic disease. Multiple myeloma. EXAM: RIGHT FEMUR PORTABLE 2 VIEW; DG C-ARM 61-120 MIN COMPARISON:  CT scan of the femurs of November 17, 2017 FINDINGS: Fluoro time reported is 1 minutes, 24 seconds. Two fluoro spot images are observed. An intramedullary rod within the femoral shaft present. There are 2 telescoping screws in the right femoral neck and head and securing screw in the lower right femoral shaft. There is no immediate  postprocedure complication. IMPRESSION: No immediate postprocedure complication following intramedullary nail placement of the right femur. Electronically Signed   By: David  Martinique M.D.   On: 11/20/2017 13:41   US Abdomen Limited Ruq  Result Date: 11/29/2017 CLINICAL DATA:  Elevated LFTs, pericholecystic fluid by CT, history hypertension, hepatitis-C EXAM: ULTRASOUND ABDOMEN LIMITED RIGHT UPPER QUADRANT COMPARISON:  CT abdomen and pelvis 11/25/2017 FINDINGS: Gallbladder: Thickened gallbladder wall or pericholecystic fluid. No gallstones or sonographic Murphy sign identified. Common bile duct: Diameter: 4 mm diameter , normal Liver: No focal lesion identified. Within normal limits in parenchymal echogenicity. Portal vein is patent on color Doppler imaging with normal direction of blood flow towards the liver. No RIGHT upper quadrant free fluid Incidentally noted increased parenchymal echogenicity of the RIGHT kidney suggesting medical renal disease. IMPRESSION: Thickened gallbladder wall and pericholecystic fluid without definite sonographic Murphy sign or gallstones ; findings are highly suspicious for cholecystitis. Question medical disease changes of RIGHT kidney. Electronically Signed   By: Lavonia Dana M.D.   On: 11/29/2017 13:46    ASSESSMENT & PLAN:   61 y.o. AA  male presenting with severe hypercalcemia, skeletal findings consistent with myeloma and significant leukocytosis/lymphocytosis in the peripheral bloodas well as AKI and hypercalcemia.  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  Prelim- BM Bx showed >95% involvement with kappa restricted serum free light chains. 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  Lymphocytosis/leukocytosis resolved with high-dose steroids and Velcade. 2) s/p  Prophylactic IM nailing for rt femur. Plan -will continue rx with Velcade + Dexamethasone. -holding off Cyclophosphamide in the setting of sepsis and new diagnosis of Hep C. -continue Acyclovir prophylaxis.  3)TLS - uric acid  Improved. Plan -continue Allopurinol -monitor daily Uric acid. -prn additional Rasburcase if uric acd increasing. -will continue Velcade D1/03/18/10 q21days with weekly dexamethasone. -We will plan to add cyclophosphamide in 1 week if no fevers and liver functions reasonably stable.  Though there might be some risk of flaring his hepatitis C this tends to be relatively lower compared to reactivation issues with hepatitis B.  4) h/o tobacco abuse-counseled on smoking cessation  5) h/o cocaine and ETOH abuse -- sober for 3 yrs  6) AKI vs ARF on CKD -- due to myeloma.  (Creatinine higher today) - multifactorial MM/TLS/sepsis/antibiotics. [name] KitchenPlan -nephrology consulting. Creatinine still increasing. 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 Some drop in hgb due to rt gluteal hematoma Plan hgb stable at 8.8 -prn PRBC transfusion for HGB <8  10) Hyperkalemia due to some TLS + renal insufficiency K resolved ot 4.2 -monitoring   11) SIRS.  Possible pneumonia .  No positive cultures  PLAN -Mx per hospitalist.  Completed antibiotics.  6) multiple myeloma related skeletal pain -Oxycodone 5-10 mg every 6 hours as needed -prn IV dilaudid given renal insuff with risk of accumulation or morphine metabolites -Senna S for bowel prophylaxis  7) Elevated aPTT nl fibrinogen -likely coagulopathy due to paraproteinemia. -rx myeloma  8) newly diagnosed Hep C -will need Hep C clinic referral on discharge -LFT monitoring with chemotherapy 9) Thrombocytopenia due to Vlecade -stable.  I shall continue to follow daily. Appreciate excellent hospital medicine cares.   I spent 25 minutes counseling the patient face to  face. The total time spent in the appointment was 35 minutes and more than 50% was on counseling and direct patient cares.    Sullivan Lone MD [place] AAHIVMS [facility] Hematology/Oncology Physician [facility]  (Office):       [phone] (Work cell):  [phone] (Fax):           [phone]

## 2017-11-30 NOTE — Progress Notes (Signed)
Admit: 11/23/2017 LOS: 7  67M with renal failure (time course unclear) in setting of kappa LC myeloma  Subjective:  SCr and BUN down, K OK GOod UOP yesterday Afebrile, BP stable   12/20 0701 - 12/21 0700 In: 50 [P.O.:50] Out: 2625 [Urine:2625]  Filed Weights   11/23/17 1443 11/25/17 0500 11/29/17 0401  Weight: 65.3 kg (144 lb) 70.1 kg (154 lb 8.7 oz) 66 kg (145 lb 8.1 oz)    Scheduled Meds: . acyclovir  200 mg Oral Daily  . allopurinol  100 mg Oral Daily  . polyethylene glycol  17 g Oral Daily  . sodium bicarbonate  650 mg Oral TID   Continuous Infusions: . sodium chloride    . sodium chloride     PRN Meds:.acetaminophen, heparin lock flush, heparin lock flush, heparin lock flush, heparin lock flush, iopamidol, lip balm, ondansetron (ZOFRAN) IV, oxyCODONE, sodium chloride flush, sodium chloride flush, sodium chloride flush, sodium chloride flush  Current Labs: reviewed  Results for ELISAH, PARMER (MRN [id number]) as of 11/29/2017 12:29  Ref. Range 11/17/2017 10:47 11/23/2017 18:00  Kappa free light chain Latest Ref Range: 3.3 - 19.4 mg/L 27,634.1 (H) 13,734.8 (H)  Lamda free light chains Latest Ref Range: 5.7 - 26.3 mg/L 5.8 2.3 (L)  Kappa, lamda light chain ratio Latest Ref Range: 0.26 - 1.65  4,764.50 (H) 5,971.65 (H)   12/8 Renal US:   IMPRESSION: 1. No hydronephrosis. 2. Increased cortical echogenicity and thinned renal cortex suggesting medical renal disease .  Results for JADDEN, YIM (MRN [id number]) as of 11/29/2017 12:29  Ref. Range 11/23/2017 18:00  Hepatitis B Surface Ag Latest Ref Range: Negative  Negative  Hep B Core Ab, Tot Latest Ref Range: Negative  Negative  Test Information Unknown Comment  Hcv Genotype Unknown Comment  HEPATITIS C GENOTYPE Unknown 1b  Hepatitis C Quantitation Latest Units: IU/mL See Final Results  HCV RNA (International Units) Latest Units: IU/mL 10,200,000  HCV log10 Latest Units: log10 IU/mL 7.009    Physical Exam:   Blood pressure 131/76, pulse 73, temperature 98.1 F (36.7 C), temperature source Oral, resp. rate (!) 23, height 5\' 8"  (1.727 m), weight 66 kg (145 lb 8.1 oz), SpO2 100 %. Thin, nad RRR Coarse bs/ b/l No LEE S/nt/nd nonfocal    A 1. Renal Failure, unclear time course 2. Kappa LC MM; Velcade + dexmethasone 3. Hypercalcemia, resolved 4. TLS, allopurinol, rec rasburicase 5. Metabolic acidosis on NaHCO3  P 1. 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 On velcade started 12/18; hopefully LC burden will come down 3. No info on baseline SCr, presumably was much better than this 4. Given I don't know his baseline renal function and the ambiguity of TPE data will defer on TPE currently  Pearson Grippe MD 11/30/2017, 11:50 AM  Recent Labs  Lab 11/28/17 0329 11/29/17 0258 11/30/17 0322  NA 141 143  143 146*  K 4.1 4.2  4.2 4.4  CL 107 110  111 115*  CO2 23 21*  21* 19*  GLUCOSE 127* 115*  115* 111*  BUN 82* 82*  85* 78*  CREATININE 5.12* 5.37*  5.37* 4.89*  CALCIUM 7.1* 7.4*  7.5* 8.0*  PHOS  --  5.0*  --    Recent Labs  Lab 11/26/17 0335 11/27/17 0701 11/28/17 0329 11/29/17 0258 11/30/17 0322  WBC 8.6 10.2 11.0* 10.0 9.5  NEUTROABS 6.5 5.3  --  7.4  --   HGB 7.7*  9.1* 8.8* 8.8* 8.5*  HCT 22.6* 25.9* 26.0* 25.6* 24.9*  MCV 87.9 86.9 88.4 89.8 90.9  PLT 126* 95* 98* 91* 66*

## 2017-11-30 NOTE — Progress Notes (Signed)
Dr Florene Glen called for update. Once up with PT in hallway saturation dropped to high 70's and lower 80's. Assisted back to bed and O2 applied [place] 3 L. No c/o from patient however the patient had increased WOB, and tachynea. He did tell PT that he would work with them again tomorrow. Orders received.

## 2017-11-30 NOTE — Progress Notes (Addendum)
PROGRESS NOTE    Andres Fernandez  QPR:[id number] DOB: [birth year] DOA: 11/23/2017 PCP: Patient, No Pcp Per   Brief Narrative:  36yom discharged 12/13, workup for suspected multiple myeloma with BM biopsy pending on discharge; 12/14 BM biopsy results available, oncology requested direct admit for aggressive inpt tx. Chemotherapy initiated 12/15. 12/16 decompensated with fever, tachycardia, hypotension; started on abx. Treated for TLS. Nephrology consulted for worsening creatinine.  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 - continue management per oncology. Chemotherapy initiated 12/15. Sp Rasburicase 12/17. Continue allopurinol. - daily uric acid levels  - Velcade per oncology   Addendum: hypoxia: pt desated to low 80's high 70's with ambulation with PT (previously 98% at rest).  Pt placed on supplemental O2.  CXR ordered notable for mild interstitial edema.  EKG with NSR.  Suspect this is due to AKI below and intestitial edema.  He denies SOB or CP when asked.  No lower extremity edema. Discussed with nephrology, given lasix 80 mg x 1  AKI, associated hyperkalemia and metabolic acidosis. complicated, multifactorial, hypotension, malignancy, TLS. - Continues worsening. Creatinine 4.44 >> 4.62 >> 5.12 >> 5.37 yesterday and improving to 4.89 today, adequate UOP. K+ normal. - continue bicarb, NAGMA mild, slightly worse today - continue management per nephrology, appreciate recs  SIRS. Resolved. Continues to be  afebrile. CT abd/pelvis with pericholecystic fluid. LFTs unremarkable. Lactic back to normal CXR right base atelectasis vs pneumonia but no infiltrate on chest CT - remains stable, afebrile, still no evidence of infection. UC unremarkable. BC NG thus far. - no abd symptoms or RUQ pain. Transaminases likely up from hepatitis C. _0  RUQ [name] with thickened gallbladder wall and pericholecystic fluid without sonographic murphy or gallstones.  Discussed with surgery on 12/20 who noted may be related to low albumin, but without symptoms can continue to monitor at this time. - d/c cefepime and follow.    Anemia of malignancy. - s/p 2 units PRBC 12/15, 1 unit 12/17. - Hgb remains stable - follow CBC. Heparin stopped.  Thrombocytopenia - Daily CBC, worsened today - Suspect secondary to chemotherapy. Management per oncology. - heparin stopped 12/18.  Hepatitis C, new dx. - f/u with GI as an outpt  Elevated transaminases - isolated, CT liver appearance unremarkable. Likely secondary to hepatitis C. Follow clinically. _1  F/u RUQ [name] as above  Pathologic right femur fx s/p intramedullary nail 12/11 - WBAT per ortho. F/u end of year for suture removal. 6 weeks day ASA 325 for DVT proph per ortho (this is currently being held).  Essential HTN. - holding amlodipine.   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 Include things that cannot be auto populated i.e. Echo, Carotid and venous dopplers, Foley,  Bipap, HD, tubes/drains, wound vac, central lines etc)  2 units pRBC 12/15, 1 unit pRBC 12/17  Antimicrobials: (specify start and planned stop date. Auto populated tables are space  occupying and do not give end dates)  Cefepime 12/17 -> 12/19  Vancomycin 12/17 -> 12/18    Subjective: No complaints.  No abdominal pain, no CP.   Objective: Vitals:   11/30/17 0200 11/30/17 0400 11/30/17 0600 11/30/17 0730  BP: (!) 95/52 132/81 124/68   Pulse: 85 87 88   Resp: (!) 23 (!) 28 (!) 29   Temp:    98.1 F (36.7 C)  TempSrc:    Oral  SpO2: 100% 95% 98%   Weight:      Height:        Intake/Output Summary (Last 24 hours) at 11/30/2017 0801 Last data filed at 11/30/2017 0600 Gross per 24 hour  Intake 50 ml  Output 2025 ml  Net -1975 ml   Filed Weights   11/23/17 1443 11/25/17 0500 11/29/17 0401  Weight: 65.3 kg (144 lb) 70.1 kg (154 lb 8.7 oz) 66 kg (145 lb 8.1 oz)    Examination:  General: No acute distress. Cardiovascular: Heart sounds show Andres Fernandez regular rate, and rhythm. No gallops or rubs. No murmurs. No JVD. Lungs: Clear to auscultation bilaterally with good air movement. No rales, rhonchi or wheezes. Abdomen: Soft, nontender, nondistended with normal active bowel sounds. No masses. No hepatosplenomegaly. Neurological: Alert and oriented 3. Moves all extremities 4 with equal strength. Cranial nerves II through XII grossly intact. Skin: Warm and dry. No rashes or lesions. Extremities: No clubbing or cyanosis. No edema. Psychiatric: Mood and affect are normal. Insight and judgment are appropriate.  Data Reviewed: I have personally reviewed following labs and imaging studies  CBC: Recent Labs  Lab 11/24/17 0344 11/25/17 0352 11/26/17 0335 11/27/17 0701 11/28/17 0329 11/29/17 0258 11/30/17 0322  WBC 42.4* 15.4* 8.6 10.2 11.0* 10.0 9.5  NEUTROABS 11.9* 9.9* 6.5 5.3  --  7.4  --   HGB 7.0* 8.1* 7.7* 9.1* 8.8* 8.8* 8.5*  HCT 20.2* 23.3* 22.6* 25.9* 26.0* 25.6* 24.9*  MCV 88.2 86.6 87.9 86.9 88.4 89.8 90.9  PLT 212 148* 126* 95* 98* 91* 66*   Basic Metabolic Panel: Recent Labs  Lab 11/26/17 0335 11/27/17 0701 11/28/17 0329 11/29/17 0258  11/30/17 0322  NA 141 138 141 143  143 146*  K 5.6* 4.6 4.1 4.2  4.2 4.4  CL 115* 107 107 110  111 115*  CO2 18* 19* 23 21*  21* 19*  GLUCOSE 132* 109* 127* 115*  115* 111*  BUN 60* 71* 82* 82*  85* 78*  CREATININE 4.44* 4.62* 5.12* 5.37*  5.37* 4.89*  CALCIUM 7.0* 7.3* 7.1* 7.4*  7.5* 8.0*  PHOS  --   --   --  5.0*  --    GFR: Estimated Creatinine Clearance: 15 mL/min (Andres Fernandez) (by C-G formula based on SCr of 4.89 mg/dL (H)). Liver Function Tests: Recent Labs  Lab 11/26/17 0335 11/27/17 0701 11/28/17 0329 11/29/17 0258 11/30/17 0322  AST 64* 104* 97* 168* 100*  ALT 39 69* 56 52 43  ALKPHOS 54 71 69 84 71  BILITOT 0.9 0.7 0.5 0.9 0.7  PROT 5.3* 5.3* 5.0* 5.4* 5.9*  ALBUMIN 2.5* 2.5* 2.3* 2.4*  2.4* 2.5*   No results for input(s): LIPASE, AMYLASE in the last 168 hours. No results for input(s): AMMONIA in the last 168 hours. Coagulation Profile: Recent Labs  Lab 11/26/17 1229  INR  1.28   Cardiac Enzymes: No results for input(s): CKTOTAL, CKMB, CKMBINDEX, TROPONINI in the last 168 hours. BNP (last 3 results) No results for input(s): PROBNP in the last 8760 hours. HbA1C: No results for input(s): HGBA1C in the last 72 hours. CBG: No results for input(s): GLUCAP in the last 168 hours. Lipid Profile: No results for input(s): CHOL, HDL, LDLCALC, TRIG, CHOLHDL, LDLDIRECT in the last 72 hours. Thyroid Function Tests: No results for input(s): TSH, T4TOTAL, FREET4, T3FREE, THYROIDAB in the last 72 hours. Anemia Panel: No results for input(s): VITAMINB12, FOLATE, FERRITIN, TIBC, IRON, RETICCTPCT in the last 72 hours. Sepsis Labs: Recent Labs  Lab 11/25/17 0813 11/25/17 1120 11/26/17 0335 11/27/17 0701  PROCALCITON  --  3.42 5.39 3.97  LATICACIDVEN 2.0* 2.2* 1.0  --     Recent Results (from the past 240 hour(s))  Culture, blood (Routine X 2) w Reflex to ID Panel     Status: None (Preliminary result)   Collection Time: 11/25/17  8:13 AM  Result Value Ref Range  Status   Specimen Description BLOOD LEFT ANTECUBITAL  Final   Special Requests   Final    BOTTLES DRAWN AEROBIC AND ANAEROBIC Blood Culture adequate volume   Culture   Final    NO GROWTH 4 DAYS Performed at [facility], Andres Fernandez [address], [place], [place] [address]    Report Status PENDING  Incomplete  Culture, blood (Routine X 2) w Reflex to ID Panel     Status: None (Preliminary result)   Collection Time: 11/25/17  8:18 AM  Result Value Ref Range Status   Specimen Description BLOOD BLOOD LEFT FOREARM  Final   Special Requests   Final    BOTTLES DRAWN AEROBIC AND ANAEROBIC Blood Culture adequate volume   Culture   Final    NO GROWTH 4 DAYS Performed at [facility], [place] [address] [place], [place] [address]    Report Status PENDING  Incomplete  MRSA PCR Screening     Status: None   Collection Time: 11/25/17  9:41 AM  Result Value Ref Range Status   MRSA by PCR NEGATIVE NEGATIVE Final    Comment:        The GeneXpert MRSA Assay (FDA approved for NASAL specimens only), is one component of [facility] comprehensive MRSA colonization surveillance program. It is not intended to diagnose MRSA infection nor to guide or monitor treatment for MRSA infections.   Urine Culture     Status: Abnormal   Collection Time: 11/25/17  4:05 PM  Result Value Ref Range Status   Specimen Description URINE, RANDOM  Final   Special Requests NONE  Final   Culture (Andres Fernandez)  Final    <10,000 COLONIES/mL INSIGNIFICANT GROWTH Performed at [facility], [address] [place],  [address]    Report Status 11/26/2017 FINAL  Final         Radiology Studies: US Abdomen Limited Ruq  Result Date: 11/29/2017 CLINICAL DATA:  Elevated LFTs, pericholecystic fluid by CT, history hypertension, hepatitis-C EXAM: ULTRASOUND ABDOMEN LIMITED RIGHT UPPER QUADRANT COMPARISON:  CT abdomen and pelvis 11/25/2017 FINDINGS: Gallbladder: Thickened gallbladder wall or pericholecystic fluid. No gallstones or  sonographic Murphy sign identified. Common bile duct: Diameter: 4 mm diameter , normal Liver: No focal lesion identified. Within normal limits in parenchymal echogenicity. Portal vein is patent on color Doppler imaging with normal direction of blood flow towards the liver. No RIGHT upper quadrant free fluid Incidentally noted increased parenchymal echogenicity of  the RIGHT kidney suggesting medical renal disease. IMPRESSION: Thickened gallbladder wall and pericholecystic fluid without definite sonographic Murphy sign or gallstones ; findings are highly suspicious for cholecystitis. Question medical disease changes of RIGHT kidney. Electronically Signed   By: Andres Fernandez M.D.   On: 11/29/2017 13:46        Scheduled Meds: . acyclovir  200 mg Oral Daily  . allopurinol  100 mg Oral Daily  . polyethylene glycol  17 g Oral Daily  . sodium bicarbonate  650 mg Oral TID   Continuous Infusions: . sodium chloride    . sodium chloride       LOS: 7 days    Time spent: over 20 min    Andres Helper, MD [facility] Pager [phone]  If 7PM-7AM, please contact night-coverage [email or web address] Password [email or web address] 11/30/2017, 8:01 AM

## 2017-11-30 NOTE — Evaluation (Signed)
Occupational Therapy Evaluation Patient Details Name: Andres Fernandez MRN: [id number] DOB: 1956/07/19 Today's Date: 11/30/2017    History of Present Illness 61 yo male admitted with fever, tachycardia. hx of multiple myeloma, drug abuse, R femur IM nail 11/20/17   Clinical Impression   Pt was admitted for the above.  He is receiving chemo here.  As noted above, he had recent IM nail for impending pathological fx; he was independent with adls prior to this.  At time of evaluation, pt had labored breathing with activity and decreased sats.  RN aware. Pt has decreased endurance for adls. Goals in acute are for supervision level     Follow Up Recommendations  SNF(vs 24/7 with HHOT)    Equipment Recommendations  3 in 1 bedside commode    Recommendations for Other Services       Precautions / Restrictions Precautions Precautions: Fall Precaution Comments: monitor oxygen, may need O2 Restrictions RLE Weight Bearing: Weight bearing as tolerated      Mobility Bed Mobility   Bed Mobility: Sit to Supine       Sit to supine: Min guard   General bed mobility comments: able to self assist.  Transfers Overall transfer level: Needs assistance Equipment used: Rolling walker (2 wheeled) Transfers: Sit to/from Stand Sit to Stand: Min assist         General transfer comment: steadying assistance; pt shaky when standing after walking    Balance                                           ADL either performed or assessed with clinical judgement   ADL Overall ADL's : Needs assistance/impaired Eating/Feeding: Independent   Grooming: Set up;Sitting   Upper Body Bathing: Set up;Sitting Upper Body Bathing Details (indicate cue type and reason): rest breaks Lower Body Bathing: Moderate assistance;Sit to/from stand   Upper Body Dressing : Minimal assistance;Sitting   Lower Body Dressing: Maximal assistance;Sit to/from stand   Toilet Transfer: Minimal  assistance;Ambulation;RW(chair)             General ADL Comments: pt limited by labored breathing.  Encouraged rest breaks     Vision         Perception     Praxis      Pertinent Vitals/Pain Faces Pain Scale: Hurts little more Pain Location: R LE Pain Descriptors / Indicators: Discomfort Pain Intervention(s): Limited activity within patient's tolerance;Monitored during session;Repositioned;Patient requesting pain meds-RN notified(declined ice)     Hand Dominance     Extremity/Trunk Assessment Upper Extremity Assessment Upper Extremity Assessment: Overall WFL for tasks assessed           Communication Communication Communication: No difficulties   Cognition Arousal/Alertness: Awake/alert Behavior During Therapy: WFL for tasks assessed/performed Overall Cognitive Status: Within Functional Limits for tasks assessed                                     General Comments  pt with labored breathing with activity.  He wanted to push himself through. Encouraged sitting rest break.  Sats in 80s on RA was 100% prior to therapy on RA  Placed on 3 liters and returned to 90% when RN arrived    Exercises     Shoulder Instructions      Home Living Family/patient expects  to be discharged to:: Unsure Living Arrangements: Alone. Per chart, was staying with finance                           Home Equipment: Walker - 2 wheels          Prior Functioning/Environment Level of Independence: Independent        Comments: pt had recent IM nail; was independent with ADLs prior to this        OT Problem List: Decreased strength;Decreased activity tolerance;Impaired balance (sitting and/or standing);Decreased knowledge of use of DME or AE;Pain;Cardiopulmonary status limiting activity      OT Treatment/Interventions: Self-care/ADL training;DME and/or AE instruction;Patient/family education;Balance training;energy conservation   OT Goals(Current  goals can be found in the care plan section) Acute Rehab OT Goals Patient Stated Goal: return home OT Goal Formulation: With patient Time For Goal Achievement: 12/14/17 Potential to Achieve Goals: Good ADL Goals Pt Will Perform Grooming: with supervision;standing Pt Will Perform Lower Body Dressing: with supervision;with adaptive equipment;sit to/from stand Pt Will Transfer to Toilet: ambulating;with supervision;bedside commode Pt Will Perform Toileting - Clothing Manipulation and hygiene: with supervision;sit to/from stand Additional ADL Goal #1: pt will initiate at least one rest break during adls for energy conservation  OT Frequency: Min 2X/week   Barriers to D/C:            Co-evaluation PT/OT/SLP Co-Evaluation/Treatment: Yes Reason for Co-Treatment: For patient/therapist safety PT goals addressed during session: Mobility/safety with mobility OT goals addressed during session: ADL's and self-care      AM-PAC PT "6 Clicks" Daily Activity     Outcome Measure Help from another person eating meals?: None Help from another person taking care of personal grooming?: A Little Help from another person toileting, which includes using toliet, bedpan, or urinal?: A Little Help from another person bathing (including washing, rinsing, drying)?: A Lot Help from another person to put on and taking off regular upper body clothing?: A Little Help from another person to put on and taking off regular lower body clothing?: A Lot 6 Click Score: 17   End of Session Nurse Communication: (decreased sats with activity)  Activity Tolerance: Patient limited by fatigue;Patient limited by pain Patient left: in bed;with call bell/phone within reach;with nursing/sitter in room  OT Visit Diagnosis: Unsteadiness on feet (R26.81);Pain Pain - Right/Left: Right Pain - part of body: Hip                Time: 1136-1209 OT Time Calculation (min): 33 min Charges:  OT General Charges $OT Visit: 1 Visit OT  Evaluation $OT Eval Moderate Complexity: 1 Mod G-Codes:     [place], OTR/L [phone] 11/30/2017  Andres Fernandez 11/30/2017, 1:33 PM

## 2017-12-01 ENCOUNTER — Other Ambulatory Visit: Payer: Self-pay | Admitting: Hematology and Oncology

## 2017-12-01 DIAGNOSIS — C9000 Multiple myeloma not having achieved remission: Secondary | ICD-10-CM

## 2017-12-01 DIAGNOSIS — D61818 Other pancytopenia: Secondary | ICD-10-CM

## 2017-12-01 LAB — COMPREHENSIVE METABOLIC PANEL
ALT: 37 U/L (ref 17–63)
AST: 45 U/L — AB (ref 15–41)
Albumin: 2.5 g/dL — ABNORMAL LOW (ref 3.5–5.0)
Alkaline Phosphatase: 65 U/L (ref 38–126)
Anion gap: 12 (ref 5–15)
BUN: 77 mg/dL — AB (ref 6–20)
CHLORIDE: 112 mmol/L — AB (ref 101–111)
CO2: 21 mmol/L — AB (ref 22–32)
CREATININE: 4.59 mg/dL — AB (ref 0.61–1.24)
Calcium: 8.6 mg/dL — ABNORMAL LOW (ref 8.9–10.3)
GFR calc Af Amer: 15 mL/min — ABNORMAL LOW (ref >60)
GFR calc non Af Amer: 13 mL/min — ABNORMAL LOW (ref >60)
Glucose, Bld: 118 mg/dL — ABNORMAL HIGH (ref 65–99)
POTASSIUM: 4.6 mmol/L (ref 3.5–5.1)
SODIUM: 145 mmol/L (ref 135–145)
Total Bilirubin: 0.7 mg/dL (ref 0.3–1.2)
Total Protein: 6.1 g/dL — ABNORMAL LOW (ref 6.5–8.1)

## 2017-12-01 LAB — CBC WITH DIFFERENTIAL/PLATELET
BASOS ABS: 0 10*3/uL (ref 0.0–0.1)
BASOS PCT: 0 %
EOS ABS: 0.1 10*3/uL (ref 0.0–0.7)
EOS PCT: 1 %
HCT: 25.8 % — ABNORMAL LOW (ref 39.0–52.0)
Hemoglobin: 8.4 g/dL — ABNORMAL LOW (ref 13.0–17.0)
LYMPHS PCT: 20 %
Lymphs Abs: 1.6 10*3/uL (ref 0.7–4.0)
MCH: 30 pg (ref 26.0–34.0)
MCHC: 32.6 g/dL (ref 30.0–36.0)
MCV: 92.1 fL (ref 78.0–100.0)
MONO ABS: 0.5 10*3/uL (ref 0.1–1.0)
Monocytes Relative: 6 %
Neutro Abs: 5.9 10*3/uL (ref 1.7–7.7)
Neutrophils Relative %: 73 %
PLATELETS: 70 10*3/uL — AB (ref 150–400)
RBC: 2.8 MIL/uL — AB (ref 4.22–5.81)
RDW: 15.1 % (ref 11.5–15.5)
WBC: 8.1 10*3/uL (ref 4.0–10.5)

## 2017-12-01 LAB — URIC ACID: URIC ACID, SERUM: 0.9 mg/dL — AB (ref 4.4–7.6)

## 2017-12-01 LAB — PROTIME-INR
INR: 0.97
PROTHROMBIN TIME: 12.8 s (ref 11.4–15.2)

## 2017-12-01 LAB — APTT: APTT: 30 s (ref 24–36)

## 2017-12-01 MED ORDER — BORTEZOMIB CHEMO SQ INJECTION 3.5 MG (2.5MG/ML)
1.3000 mg/m2 | Freq: Once | INTRAMUSCULAR | Status: AC
  Administered 2017-12-01: 2.25 mg via SUBCUTANEOUS
  Filled 2017-12-01: qty 2.25

## 2017-12-01 MED ORDER — DEXAMETHASONE 2 MG PO TABS
20.0000 mg | ORAL_TABLET | Freq: Once | ORAL | Status: AC
  Administered 2017-12-01: 20 mg via ORAL
  Filled 2017-12-01: qty 10

## 2017-12-01 MED ORDER — FUROSEMIDE 10 MG/ML IJ SOLN
80.0000 mg | Freq: Once | INTRAMUSCULAR | Status: AC
Start: 2017-12-01 — End: 2017-12-01
  Administered 2017-12-01: 80 mg via INTRAVENOUS
  Filled 2017-12-01: qty 8

## 2017-12-01 MED ORDER — ONDANSETRON HCL 4 MG PO TABS
4.0000 mg | ORAL_TABLET | Freq: Once | ORAL | Status: AC
  Administered 2017-12-01: 4 mg via ORAL
  Filled 2017-12-01: qty 1

## 2017-12-01 NOTE — Progress Notes (Signed)
Andres Fernandez   HEMATOLOGY/ONCOLOGY INPATIENT PROGRESS NOTE  Date of Service: 11/30/2017 Inpatient Attending: .Elodia Florence., *  SUBJECTIVE  Patient was seen eveing 11/30/2017. He notes no acute new symptoms. Noted to have some SOB and desaturation after walking with therapies. D/w Dr Florene Glen -- concern for fluid overload - will connect with neprhology regarding this.  No fevers today. Uric acid remains normal after rasburicase and on renally dosed allopurinol BP stable. Creatinine slightly lower..    No issues with overt bleeding.   OBJECTIVE:   NAD PHYSICAL EXAMINATION: VS reviewed GENERAL:alert, in no acute distress and comfortable SKIN: no acute rashes, no significant lesions EYES: conjunctiva are pink and non-injected, sclera anicteric OROPHARYNX: MMM, no exudates, no oropharyngeal erythema or ulceration NECK: supple,  JVD mild we connected about that medication he gave me like that told him LYMPH:  no palpable lymphadenopathy in the cervical, axillary or inguinal regions LUNGS: no basal rales. HEART: regular rate & rhythm ABDOMEN:  normoactive bowel sounds , non tender, not distended. Extremity: no pedal edema PSYCH: alert & oriented x 3 with fluent speech NEURO: no focal motor/sensory deficits   MEDICAL HISTORY:  Past Medical History:  Diagnosis Date  . BPH (benign prostatic hyperplasia)   . Hepatitis C 11/27/2017  . Hypertension   . 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 Tobacco abuse     SURGICAL HISTORY: Past Surgical History:  Procedure Laterality Date  . APPENDECTOMY    . FEMUR IM NAIL Right 11/20/2017   Procedure: RIGHT FEMORAL INTRAMEDULLARY (IM) NAIL;  Surgeon: Nicholes Stairs, MD;  Location: [facility];  Service: Orthopedics;  Laterality: Right;    SOCIAL HISTORY: Social History   Socioeconomic History  . Marital status: Single    Spouse name: Not on file  . Number of children: Not on file  . Years of education: Not on file  . Highest  education level: Not on file  Social Needs  . Financial resource strain: Not on file  . Food insecurity - worry: Not on file  . Food insecurity - inability: Not on file  . Transportation needs - medical: Not on file  . Transportation needs - non-medical: Not on file  Occupational History  . Not on file  Tobacco Use  . Smoking status: Former Smoker    Packs/day: 0.50    Types: Cigarettes    Last attempt to quit: 11/09/2017    Years since quitting: 0.0  . Smokeless tobacco: Never Used  . Tobacco comment: no cigarettes x 1 week.  Substance and Sexual Activity  . Alcohol use: Yes  . Drug use: No  . Sexual activity: Not on file  Other Topics Concern  . Not on file  Social History Narrative  . Not on file    FAMILY HISTORY: Family History  Problem Relation Age of Onset  . COPD Mother   . Liver cancer Mother     ALLERGIES:  has No Known Allergies.  MEDICATIONS:  Scheduled Meds: . acyclovir  200 mg Oral Daily  . allopurinol  100 mg Oral Daily  . feeding supplement (ENSURE ENLIVE)  237 mL Oral BID BM  . polyethylene glycol  17 g Oral Daily  . sodium bicarbonate  650 mg Oral TID   Continuous Infusions: . sodium chloride    . sodium chloride     PRN Meds:.acetaminophen, heparin lock flush, heparin lock flush, heparin lock flush, heparin lock flush, iopamidol, lip balm, ondansetron (ZOFRAN) IV, oxyCODONE, sodium chloride flush, sodium chloride  flush, sodium chloride flush, sodium chloride flush  REVIEW OF SYSTEMS:    10 Point review of Systems was done is negative except as noted above.   LABORATORY DATA:  I have reviewed the data as listed  . CBC Latest Ref Rng & Units 11/30/2017 11/29/2017  WBC 4.0 - 10.5 K/uL 9.5 10.0  Hemoglobin 13.0 - 17.0 g/dL 8.5(L) 8.8(L)  Hematocrit 39.0 - 52.0 % 24.9(L) 25.6(L)  Platelets 150 - 400 K/uL 66(L) 91(L)   . CMP Latest Ref Rng & Units 11/30/2017 11/29/2017  Glucose 65 - 99 mg/dL 111(H) 115(H)  BUN 6 - 20 mg/dL 78(H) 82(H)    Creatinine 0.61 - 1.24 mg/dL 4.89(H) 5.37(H)  Sodium 135 - 145 mmol/L 146(H) 143  Potassium 3.5 - 5.1 mmol/L 4.4 4.2  Chloride 101 - 111 mmol/L 115(H) 110  CO2 22 - 32 mmol/L 19(L) 21(L)  Calcium 8.9 - 10.3 mg/dL 8.0(L) 7.4(L)  Total Protein 6.5 - 8.1 g/dL 5.9(L) -  Total Bilirubin 0.3 - 1.2 mg/dL 0.7 -  Alkaline Phos 38 - 126 U/L 71 -  AST 15 - 41 U/L 100(H) -  ALT 17 - 63 U/L 43 -      Component     Latest Ref Rng & Units 11/17/2017  Prostatic Specific Antigen     0.00 - 4.00 ng/mL 0.83  HIV Screen 4th Generation wRfx     Non Reactive Non Reactive  TSH     0.350 - 4.500 uIU/mL 1.874  Beta-2 Microglobulin     0.6 - 2.4 mg/L 25.7 (H)             RADIOGRAPHIC STUDIES: I have personally reviewed the radiological images as listed and agreed with the findings in the report. Ct Abdomen Pelvis Wo Contrast  Result Date: 11/25/2017 CLINICAL DATA:  Fever of unknown origin EXAM: CT CHEST, ABDOMEN AND PELVIS WITHOUT CONTRAST TECHNIQUE: Multidetector CT imaging of the chest, abdomen and pelvis was performed following the standard protocol without IV contrast. COMPARISON:  Left lower extremity CT performed 11/17/2017. FINDINGS: CT CHEST FINDINGS Cardiovascular: Heart is borderline enlarged. Aorta is normal caliber with scattered aortic calcifications. Mediastinum/Nodes: No mediastinal, hilar, or axillary adenopathy. Lungs/Pleura: Severe centrilobular and paraseptal emphysema. Trace left pleural effusion. No confluent airspace opacities. Musculoskeletal: No acute bony abnormality. CT ABDOMEN PELVIS FINDINGS Hepatobiliary: Pericholecystic fluid present. No visible focal hepatic abnormality. Pancreas: No focal abnormality or ductal dilatation. Spleen: No focal abnormality. Normal size. Small calcification in the superior spleen. Adrenals/Urinary Tract: No adrenal abnormality. No focal renal abnormality. No stones or hydronephrosis. Urinary bladder is unremarkable. Stomach/Bowel:  Stomach, large and small bowel grossly unremarkable. Moderate stool burden in the colon. Vascular/Lymphatic: No evidence of aneurysm or adenopathy. Aortic and iliac calcifications. Reproductive: No visible focal abnormality. Other: No free fluid or free air. Musculoskeletal: Stranding noted within the subcutaneous soft tissues overlying the right lateral buttocks. There is heterogeneous material within the right gluteus muscles. These findings are likely related to recent hip surgery. Lytic destructive process noted within the left iliac bone on image 86 measuring up to 2.6 cm, unchanged since prior left lower extremity CT. IMPRESSION: Pericholecystic fluid noted. If there is clinical concern for cholecystitis, this could be further evaluated with ultrasound. Lytic destructive lesion within the left lateral iliac bone measuring up to 2.6 cm. It is most likely reflects an area of multiple myeloma or metastasis. Intramuscular hematoma within the right gluteus muscles with subcutaneous stranding, compatible with recent surgery. Electronically Signed   By: Lennette Bihari  Dover M.D.   On: 11/25/2017 14:28   Dg Chest 2 View  Result Date: 11/17/2017 CLINICAL DATA:  61 year old male with chest pain. EXAM: CHEST  2 VIEW COMPARISON:  Chest radiograph dated 05/10/2015 FINDINGS: Mild right lung base atelectatic changes/ scarring. There is no focal consolidation, pleural effusion, or pneumothorax. The cardiac silhouette is within normal limits. There is mild degenerative changes of the spine. No acute osseous pathology. IMPRESSION: No active cardiopulmonary disease. Electronically Signed   By: Anner Crete M.D.   On: 11/17/2017 07:08   Ct Head Wo Contrast  Result Date: 11/17/2017 CLINICAL DATA:  61 year old male with numbness and tingling. Paraplegia. EXAM: CT HEAD WITHOUT CONTRAST TECHNIQUE: Contiguous axial images were obtained from the base of the skull through the vertex without intravenous contrast. COMPARISON:  Facial  bone CT dated 03/27/2010 FINDINGS: Brain: The ventricles and sulci appropriate size for patient's age. Minimal periventricular and deep white matter chronic microvascular ischemic changes noted. There is no acute intracranial hemorrhage. No mass effect or midline shift noted. No extra-axial fluid collection. Vascular: There is slight high attenuation of the right MCA in the sylvian fissure (series 7 image 12). This may be chronic. If there is clinical concern for right MCA territory ischemia further evaluation with CT angiography of the head or MRI is recommended. Skull: There are innumerable lucent calvarial lesions concerning for multiple myeloma or metastatic disease. Correlation with history of known malignancy recommended. No acute fracture. Sinuses/Orbits: There is complete opacification of the visualized right maxillary sinus. Mild mucoperiosteal thickening of paranasal sinuses. There is a lytic lesion involving the posterior right mastoid versus effusion. Other: None IMPRESSION: 1. No acute intracranial hemorrhage. 2. Slightly high attenuation of the right MCA in the right sylvian fissure which may be chronic. Further evaluation with CTA or MRI is recommended if there is high clinical concern for acute right MCA territory ischemia. 3. 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 Correlation with history of malignancy recommended. Electronically Signed   By: Anner Crete M.D.   On: 11/17/2017 06:05   Ct Chest Wo Contrast  Result Date: 11/25/2017 CLINICAL DATA:  Fever of unknown origin EXAM: CT CHEST, ABDOMEN AND PELVIS WITHOUT CONTRAST TECHNIQUE: Multidetector CT imaging of the chest, abdomen and pelvis was performed following the standard protocol without IV contrast. COMPARISON:  Left lower extremity CT performed 11/17/2017. FINDINGS: CT CHEST FINDINGS Cardiovascular: Heart is borderline enlarged. Aorta is normal caliber with scattered aortic calcifications. Mediastinum/Nodes: No  mediastinal, hilar, or axillary adenopathy. Lungs/Pleura: Severe centrilobular and paraseptal emphysema. Trace left pleural effusion. No confluent airspace opacities. Musculoskeletal: No acute bony abnormality. CT ABDOMEN PELVIS FINDINGS Hepatobiliary: Pericholecystic fluid present. No visible focal hepatic abnormality. Pancreas: No focal abnormality or ductal dilatation. Spleen: No focal abnormality. Normal size. Small calcification in the superior spleen. Adrenals/Urinary Tract: No adrenal abnormality. No focal renal abnormality. No stones or hydronephrosis. Urinary bladder is unremarkable. Stomach/Bowel: Stomach, large and small bowel grossly unremarkable. Moderate stool burden in the colon. Vascular/Lymphatic: No evidence of aneurysm or adenopathy. Aortic and iliac calcifications. Reproductive: No visible focal abnormality. Other: No free fluid or free air. Musculoskeletal: Stranding noted within the subcutaneous soft tissues overlying the right lateral buttocks. There is heterogeneous material within the right gluteus muscles. These findings are likely related to recent hip surgery. Lytic destructive process noted within the left iliac bone on image 86 measuring up to 2.6 cm, unchanged since prior left lower extremity CT. IMPRESSION: Pericholecystic fluid noted. If there is clinical concern for cholecystitis, this  could be further evaluated with ultrasound. Lytic destructive lesion within the left lateral iliac bone measuring up to 2.6 cm. It is most likely reflects an area of multiple myeloma or metastasis. Intramuscular hematoma within the right gluteus muscles with subcutaneous stranding, compatible with recent surgery. Electronically Signed   By: Rolm Baptise M.D.   On: 11/25/2017 14:28   US Renal  Result Date: 11/17/2017 CLINICAL DATA:  Acute renal injury EXAM: RENAL / URINARY TRACT ULTRASOUND COMPLETE COMPARISON:  None. FINDINGS: Right Kidney: Length: 10.3 cm. Increased cortical echogenicity. No  hydronephrosis. Than renal cortex. Left Kidney: Length: 10.2 cm. Increased cortical echogenicity. No hydronephrosis. Thinned renal cortex. Bladder: Appears normal for degree of bladder distention. IMPRESSION: 1. No hydronephrosis. 2. Increased cortical echogenicity and thinned renal cortex suggesting medical renal disease . Electronically Signed   By: Suzy Bouchard M.D.   On: 11/17/2017 08:32   Ct Femur Left Wo Contrast  Result Date: 11/18/2017 CLINICAL DATA:  Bilateral upper leg pain, worse on the right for 2 weeks. Diffuse weakness. Lytic lesions in the femurs on osseous survey. EXAM: CT OF THE LOWER LEFT EXTREMITY WITHOUT CONTRAST TECHNIQUE: Multidetector CT imaging of the lower left extremity was performed according to the standard protocol. COMPARISON:  Osseous survey 11/17/2017. FINDINGS: Bones/Joint/Cartilage No fracture is identified on the right or left. Multiple foci of endosteal scalloping are seen in the femurs bilaterally consistent with lytic lesions on plain films. Tumor burden appears fairly symmetric on the right and left. Lytic lesions are also identified in the right and left acetabuli. On the left, cortical disruption is seen adjacent to the femoral head in the anterior wall measuring 1.4 cm AP, extending 0.9 cm into bone medially and measuring 2.2 cm craniocaudal. The inner cortical wall is not disrupted. On the right, lysis extends across the medial wall of the acetabulum where there is cortical destruction adjacent to the femoral head measuring 3.5 cm AP AP by 2.0 cm craniocaudal. The inner cortical wall is not disrupted. Multiple foci of fatty marrow replacement are identified in both femurs consistent with tumor deposits. Ligaments Suboptimally assessed by CT. Muscles and Tendons Intact and normal appearance. Soft tissues No acute abnormality. IMPRESSION: Negative for fracture. Multiple lytic lesions are present throughout both femurs with an appearance most consistent with multiple  myeloma. Lytic lesions are also seen in the walls of the acetabuli bilaterally most consistent with multiple myeloma. As described above, the inner cortical wall of both the right and left acetabulum is preserved with destruction of the outer cortical wall identified. Electronically Signed   By: Inge Rise M.D.   On: 11/18/2017 10:24   Ct Femur Right Wo Contrast  Result Date: 11/18/2017 CLINICAL DATA:  Bilateral upper leg pain, worse on the right for 2 weeks. Diffuse weakness. Lytic lesions in the femurs on osseous survey. EXAM: CT OF THE LOWER LEFT EXTREMITY WITHOUT CONTRAST TECHNIQUE: Multidetector CT imaging of the lower left extremity was performed according to the standard protocol. COMPARISON:  Osseous survey 11/17/2017. FINDINGS: Bones/Joint/Cartilage No fracture is identified on the right or left. Multiple foci of endosteal scalloping are seen in the femurs bilaterally consistent with lytic lesions on plain films. Tumor burden appears fairly symmetric on the right and left. Lytic lesions are also identified in the right and left acetabuli. On the left, cortical disruption is seen adjacent to the femoral head in the anterior wall measuring 1.4 cm AP, extending 0.9 cm into bone medially and measuring 2.2 cm craniocaudal. The inner cortical wall  is not disrupted. On the right, lysis extends across the medial wall of the acetabulum where there is cortical destruction adjacent to the femoral head measuring 3.5 cm AP AP by 2.0 cm craniocaudal. The inner cortical wall is not disrupted. Multiple foci of fatty marrow replacement are identified in both femurs consistent with tumor deposits. Ligaments Suboptimally assessed by CT. Muscles and Tendons Intact and normal appearance. Soft tissues No acute abnormality. IMPRESSION: Negative for fracture. Multiple lytic lesions are present throughout both femurs with an appearance most consistent with multiple myeloma. Lytic lesions are also seen in the walls of the  acetabuli bilaterally most consistent with multiple myeloma. As described above, the inner cortical wall of both the right and left acetabulum is preserved with destruction of the outer cortical wall identified. Electronically Signed   By: Inge Rise M.D.   On: 11/18/2017 10:24   Dg Chest Port 1 View  Result Date: 11/30/2017 CLINICAL DATA:  Short of breath. EXAM: PORTABLE CHEST 1 VIEW COMPARISON:  11/26/2017 FINDINGS: Cardiac silhouette is top-normal in size. No mediastinal or hilar masses. No evidence of adenopathy. There are thickened bronchovascular and interstitial markings, similar to the prior study. These are increased when compared to a chest radiograph dated 04/13/2015. There is no focal lung consolidation to suggest pneumonia. Probable small pleural effusions. No pneumothorax. Lytic lesion erodes the lateral left eighth rib, unchanged from the recent exam. There are also numerous small lucent bone lesions throughout the visualized skeleton. 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 IMPRESSION: 1. Similar appearance to the prior chest radiograph. Prominent bronchovascular markings and mild interstitial thickening suggests mild interstitial edema. No convincing pneumonia. Electronically Signed   By: Lajean Manes M.D.   On: 11/30/2017 15:06   Dg Chest Port 1 View  Result Date: 11/26/2017 CLINICAL DATA:  61 year old male with shortness of breath and bibasilar crackles EXAM: PORTABLE CHEST 1 VIEW COMPARISON:  11/25/2017 CT, 11/17/2017 chest radiograph and prior studies. FINDINGS: Upper limits normal heart size noted. Pulmonary vascular congestion and interstitial opacities are present likely representing interstitial edema. Increased opacity at the right lung base may represent atelectasis, focal edema or possibly superimposed pneumonia. A lytic lesion within the anterolateral left eighth rib is noted. IMPRESSION: 1. Pulmonary vascular congestion  and probable interstitial pulmonary edema. 2. Right basilar opacity which may represent atelectasis, focal edema or possibly superimposed pneumonia. 3. Left eighth rib lytic lesion -question multiple myeloma or metastatic disease. Electronically Signed   By: Margarette Canada M.D.   On: 11/26/2017 11:08   Dg Bone Survey Met  Result Date: 11/17/2017 CLINICAL DATA:  He is complaining of numbness in his distal arms and legs which has been present for the last 2 weeks. It is been generally getting worse. He also has noted a painful knot in the right rib cage. He denies fever, chills, sweats.*comment was truncated* in a have a EXAM: METASTATIC BONE SURVEY COMPARISON:  Head -CT 11/17/2017 FINDINGS: Skull: Multiple lytic lesions throughout the calvarium. Lytic lesions scalloped the cortex of the LEFT and RIGHT humeri and clavicles. Lytic lesions in the radius and ulna of the forearms Lytic lesions scalloping enter cortex of the LEFT RIGHT femur. Lytic lesions within the fibulae. IMPRESSION: 1. Lytic lesions scalloping the inner cortex of the axillary and appendicular skeleton including the skull, clavicles, humeri, femurs and distal long bones. 2. Findings most suggestive multiple myeloma. Electronically Signed   By: Suzy Bouchard M.D.   On: 11/17/2017 09:50  Ct Bone Marrow Biopsy & Aspiration  Result Date: 11/20/2017 INDICATION: 61 year old male with a history of multiple myeloma EXAM: CT BONE MARROW BIOPSY AND ASPIRATION MEDICATIONS: None. ANESTHESIA/SEDATION: Moderate (conscious) sedation was employed during this procedure. A total of Versed 2.0 mg and Fentanyl 2 Hunter mcg was administered intravenously. Moderate Sedation Time: 11 minutes. The patient's level of consciousness and vital signs were monitored continuously by radiology nursing throughout the procedure under my direct supervision. FLUOROSCOPY TIME:  CT COMPLICATIONS: None PROCEDURE: The procedure risks, benefits, and alternatives were explained to  the patient. Questions regarding the procedure were encouraged and answered. The patient understands and consents to the procedure. Scout CT of the pelvis was performed for surgical planning purposes. The posterior pelvis was prepped with chlorhexidinein a sterile fashion, and a sterile drape was applied covering the operative field. A sterile gown and sterile gloves were used for the procedure. Local anesthesia was provided with 1% Lidocaine. We targeted the left posterior iliac bone for biopsy. The skin and subcutaneous tissues were infiltrated with 1% lidocaine without epinephrine. A small stab incision was made with an 11 blade scalpel, and an 11 gauge Murphy needle was advanced with CT guidance to the posterior cortex. Manual forced was used to advance the needle through the posterior cortex and the stylet was removed. A bone marrow aspirate was retrieved and passed to a cytotechnologist in the room. The Murphy needle was then advanced without the stylet for a core biopsy. The core biopsy was retrieved and also passed to a cytotechnologist. Manual pressure was used for hemostasis and a sterile dressing was placed. No complications were encountered no significant blood loss was encountered. Patient tolerated the procedure well and remained hemodynamically stable throughout. IMPRESSION: Status post CT-guided bone marrow biopsy, with tissue specimen sent to pathology for complete histopathologic analysis Signed, Dulcy Fanny. Earleen Newport, DO [facility] Electronically Signed   By: Corrie Mckusick D.O.   On: 11/20/2017 10:06   Dg C-arm 1-60 Min  Result Date: 11/20/2017 CLINICAL DATA:  Status post inter medullary nail and screw placement for impending right femur fracture due to metastatic disease. Multiple myeloma. EXAM: RIGHT FEMUR PORTABLE 2 VIEW; DG C-ARM 61-120 MIN COMPARISON:  CT scan of the femurs of November 17, 2017 FINDINGS: Fluoro time reported is 1  minutes, 24 seconds. Two fluoro spot images are observed. An intramedullary rod within the femoral shaft present. There are 2 telescoping screws in the right femoral neck and head and securing screw in the lower right femoral shaft. There is no immediate postprocedure complication. IMPRESSION: No immediate postprocedure complication following intramedullary nail placement of the right femur. Electronically Signed   By: David  Martinique M.D.   On: 11/20/2017 13:41   Dg Femur Port, Min 2 Views Right  Result Date: 11/20/2017 CLINICAL DATA:  Status post inter medullary nail and screw placement for impending right femur fracture due to metastatic disease. Multiple myeloma. EXAM: RIGHT FEMUR PORTABLE 2 VIEW; DG C-ARM 61-120 MIN COMPARISON:  CT scan of the femurs of November 17, 2017 FINDINGS: Fluoro time reported is 1 minutes, 24 seconds. Two fluoro spot images are observed. An intramedullary rod within the femoral shaft present. There are 2 telescoping screws in the right femoral neck and head and securing screw in the lower right femoral shaft. There is no immediate postprocedure complication. IMPRESSION: No immediate postprocedure complication following intramedullary nail placement of the right femur. Electronically Signed   By: David  Martinique M.D.   On:  11/20/2017 13:41   US Abdomen Limited Ruq  Result Date: 11/29/2017 CLINICAL DATA:  Elevated LFTs, pericholecystic fluid by CT, history hypertension, hepatitis-C EXAM: ULTRASOUND ABDOMEN LIMITED RIGHT UPPER QUADRANT COMPARISON:  CT abdomen and pelvis 11/25/2017 FINDINGS: Gallbladder: Thickened gallbladder wall or pericholecystic fluid. No gallstones or sonographic Murphy sign identified. Common bile duct: Diameter: 4 mm diameter , normal Liver: No focal lesion identified. Within normal limits in parenchymal echogenicity. Portal vein is patent on color Doppler imaging with normal direction of blood flow towards the liver. No RIGHT upper quadrant free fluid  Incidentally noted increased parenchymal echogenicity of the RIGHT kidney suggesting medical renal disease. IMPRESSION: Thickened gallbladder wall and pericholecystic fluid without definite sonographic Murphy sign or gallstones ; findings are highly suspicious for cholecystitis. Question medical disease changes of RIGHT kidney. Electronically Signed   By: Lavonia Dana M.D.   On: 11/29/2017 13:46    ASSESSMENT & PLAN:   61 y.o. AA male presenting with severe hypercalcemia, skeletal findings consistent with myeloma and significant leukocytosis/lymphocytosis in the peripheral bloodas well as AKI and hypercalcemia.  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  Prelim- BM Bx showed >95% involvement with kappa restricted serum free light chains. 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  Lymphocytosis/leukocytosis resolved with high-dose steroids and Velcade. 2) s/p Prophylactic IM nailing for rt femur. Plan -will continue rx with Velcade ( D1/D4/D8 and D11) + Dexamethasone. -hold Velcade dose if platelets < 50k -holding off Cyclophosphamide in the setting of sepsis and new diagnosis of Hep C. -continue Acyclovir prophylaxis. -rpt Payne labs ordered to monitor for improvement of Light chain burden.  3)TLS - uric acid  Improved. Plan -continue Allopurinol -monitor daily Uric acid. -prn additional Rasburcase if uric acd increasing. -will continue Velcade D1/03/18/10 q21days with weekly dexamethasone. -We will plan to add cyclophosphamide from cycle 2  Depending on labs and liver function.  Though there might be some risk of flaring his hepatitis C this tends to be relatively lower compared to reactivation issues with hepatitis B.  4) h/o tobacco abuse-counseled on smoking cessation  5) h/o cocaine  and ETOH abuse -- sober for 3 yrs  6) AKI vs ARF on CKD -- due to myeloma.  (Creatinine somewhat slightly lower today) - multifactorial MM/TLS/sepsis/antibiotics.Primary light chain nephrology .Plan -nephrology consulting.  -appears soemwhat fluid up today with some exertional dyspnea and desaturations. 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 Some drop in hgb due to rt gluteal hematoma Plan hgb stable at 8.5 -prn PRBC transfusion for HGB <8  10) Hyperkalemia due to some TLS + renal insufficiency K resolved ot 4.2 -monitoring   11) SIRS.  Possible pneumonia .  No positive cultures  [name] abd ? Cholecystitis. No clinical TTP over RUQ. Nl bilurubin/AST/ALT/ALK PLAN -Mx per hospitalist.  Received broad spectrum antibiotics  -further evaluation and management of cholecystitis per hospitalist. Consider HIDA scan. -if acute acalculus cholecystitis suspected - might need to hold myeloma treatment. -might need antibiotics for a longer duration 10-14 days.  6) multiple myeloma related skeletal pain Mx per hospitalist   7) Elevated aPTT nl fibrinogen -likely coagulopathy due to paraproteinemia. -rx myeloma Plan -rpt aPTT in the AM -if still elevated might need PTT mixing studies, TT and RT to confirm that this is paraproteinemia related.  8) newly diagnosed Hep C -  will need Hep C clinic referral on discharge -LFT monitoring with chemotherapy  9) Thrombocytopenia due to Velcade. Possible additional from SIRS/antibiotics.    I spent 30 minutes counseling the patient face to face. The total time spent in the appointment was 40 minutes and more than 50% was on counseling and direct patient cares.    Sullivan Lone MD [place] AAHIVMS [facility] Hematology/Oncology Physician [facility]  (Office):       [phone] (Work cell):  [phone] (Fax):           [phone]

## 2017-12-01 NOTE — Plan of Care (Signed)
  Progressing Health Behavior/Discharge Planning: Ability to manage health-related needs will improve 12/01/2017 1109 - Progressing by Staci Righter, RN Clinical Measurements: Ability to maintain clinical measurements within normal limits will improve 12/01/2017 1109 - Progressing by Staci Righter, RN Will remain free from infection 12/01/2017 1109 - Progressing by Staci Righter, RN Activity: Risk for activity intolerance will decrease 12/01/2017 1109 - Progressing by Staci Righter, RN Activity: Expression of feelings of increased energy will increase 12/01/2017 1109 - Progressing by Staci Righter, RN

## 2017-12-01 NOTE — Progress Notes (Signed)
PROGRESS NOTE    Andres Fernandez  WHQ:[id number] DOB: Apr 22, 1956 DOA: 11/23/2017 PCP: Patient, No Pcp Per   Brief Narrative:  13yom discharged 12/13, workup for suspected multiple myeloma with BM biopsy pending on discharge; 12/14 BM biopsy results available, oncology requested direct admit for aggressive inpt tx. Chemotherapy initiated 12/15. 12/16 decompensated with fever, tachycardia, hypotension; started on abx. Treated for TLS. Nephrology consulted for worsening creatinine.  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 - continue management per oncology. Chemotherapy initiated 12/15. Sp Rasburicase 12/17. Continue allopurinol. - daily uric acid levels  - Velcade per oncology   Acute Hypoxic Resp Failure: pt desated to low 80's high 70's with ambulation with PT (previously 98% at rest) on 12/21.  Pt placed on supplemental O2.  CXR ordered notable for mild interstitial edema.  EKG with NSR.  Suspect this is due to volume overload with AKI below and intestitial edema with contribution from COPD as well (emphysema seen on CT).  He denies SOB or CP when asked.  No lower extremity edema. low suspicion for PE at this time with findings of interstial edema on CXR and lack of tachycardia, CP, evidence of DVT on exam.  On review of chart has been intermittently on O2 since at least the 16th. Discussed with nephrology, given lasix 80 mg x 1 yesterday Will repeat lasix 80 mg today Wean O2 as tolerated  AKI, associated  hyperkalemia and metabolic acidosis. complicated, multifactorial, hypotension, malignancy, TLS. - Creatinine 4.44 >> 4.62 >> 5.12 >> 5.37 >> 4.89 >> 4.59 - adequate UOP. K+ normal. - continue bicarb, NAGMA mild - continue management per nephrology, appreciate recs - lasix 80 given 12/21 and repeat dose ordered for today  SIRS. Resolved. Continues to be afebrile. CT abd/pelvis with pericholecystic fluid. LFTs unremarkable. Lactic back to normal CXR right base atelectasis vs pneumonia but no infiltrate on chest CT - remains stable, afebrile, still no evidence of infection. UC unremarkable. BC NG thus far. - no abd symptoms or RUQ pain. Transaminases likely up from hepatitis C. _0  RUQ [name] with thickened gallbladder wall and pericholecystic fluid without sonographic murphy or gallstones.  Discussed with surgery on 12/20 who noted may be related to low albumin, but without symptoms can continue to monitor at this time. - d/c cefepime and follow.    Anemia of malignancy. - s/p 2 units PRBC 12/15, 1 unit 12/17. - Hgb remains stable - follow CBC. Heparin stopped.  Thrombocytopenia - Daily CBC, worsened today - Suspect secondary to chemotherapy. Management per oncology. - heparin stopped 12/18.  Hepatitis C, new dx. - f/u with GI as an outpt  Elevated transaminases - isolated, CT liver appearance unremarkable. Likely secondary to hepatitis C. Follow clinically. - RUQ [name] as above  Pathologic right femur fx s/p intramedullary nail 12/11 - WBAT per ortho. F/u end of year for suture removal. 6 weeks day ASA 325 for DVT proph per ortho (this is currently being held).  Essential HTN. - holding amlodipine.  Constipation - resolving; continue bowel regimen  Alcohol abuse per chart, sober x3 years per char  Tobacco use disorder  DVT prophylaxis: SCD Code Status: full  Family Communication: none at bedside Disposition Plan: pending    Consultants:    Oncology  nephrology  Procedures: (Don't include imaging studies which can be auto populated. Include things that cannot be auto populated i.e. Echo, Carotid and venous dopplers, Foley, Bipap, HD, tubes/drains, wound vac, central lines etc)  2 units pRBC 12/15, 1 unit pRBC 12/17  Antimicrobials: (specify start and planned stop date. Auto populated tables are space occupying and do not give end dates)  Cefepime 12/17 -> 12/19  Vancomycin 12/17 -> 12/18    Subjective: No complaints.   Had some SOB last night that has resolved.  Objective: Vitals:   12/01/17 0200 12/01/17 0400 12/01/17 0402 12/01/17 0600  BP: 128/80 (!) 147/84  (!) 101/55  Pulse: 67 68  68  Resp: 16 (!) 22  19  Temp:   97.8 F (36.6 C)   TempSrc:   Oral   SpO2: 93% 92%  94%  Weight:      Height:        Intake/Output Summary (Last 24 hours) at 12/01/2017 0733 Last data filed at 12/01/2017 0200 Gross per 24 hour  Intake 720 ml  Output 1950 ml  Net -1230 ml   Filed Weights   11/23/17 1443 11/25/17 0500 11/29/17 0401  Weight: 65.3 kg (144 lb) 70.1 kg (154 lb 8.7 oz) 66 kg (145 lb 8.1 oz)    Examination:  General: No acute distress. Cardiovascular: Heart sounds show Andres Fernandez regular rate, and rhythm. No gallops or rubs. No murmurs.  Lungs: Clear to auscultation bilaterally with good air movement. No rales, rhonchi or wheezes. Abdomen: Soft, nontender, nondistended with normal active bowel sounds. No masses. No hepatosplenomegaly. Neurological: Alert and oriented 3. Moves all extremities 4 with equal strength. Cranial nerves II through XII grossly intact. Skin: Warm and dry. No rashes or lesions. Extremities: No clubbing or cyanosis. No edema. Trace sacral edema. Psychiatric: Mood and affect are normal. Insight and judgment are appropriate.  Data Reviewed: I have personally reviewed following labs and imaging studies  CBC: Recent Labs  Lab 11/25/17 0352 11/26/17 0335 11/27/17 0701 11/28/17 0329  11/29/17 0258 11/30/17 0322 12/01/17 0336  WBC 15.4* 8.6 10.2 11.0* 10.0 9.5 8.1  NEUTROABS 9.9* 6.5 5.3  --  7.4  --  5.9  HGB 8.1* 7.7* 9.1* 8.8* 8.8* 8.5* 8.4*  HCT 23.3* 22.6* 25.9* 26.0* 25.6* 24.9* 25.8*  MCV 86.6 87.9 86.9 88.4 89.8 90.9 92.1  PLT 148* 126* 95* 98* 91* 66* 70*   Basic Metabolic Panel: Recent Labs  Lab 11/27/17 0701 11/28/17 0329 11/29/17 0258 11/30/17 0322 12/01/17 0336  NA 138 141 143  143 146* 145  K 4.6 4.1 4.2  4.2 4.4 4.6  CL 107 107 110  111 115* 112*  CO2 19* 23 21*  21* 19* 21*  GLUCOSE 109* 127* 115*  115* 111* 118*  BUN 71* 82* 82*  85* 78* 77*  CREATININE 4.62* 5.12* 5.37*  5.37* 4.89* 4.59*  CALCIUM 7.3* 7.1* 7.4*  7.5* 8.0* 8.6*  PHOS  --   --  5.0*  --   --    GFR: Estimated Creatinine Clearance: 16 mL/min (Andres Fernandez) (by C-G formula based on SCr of 4.59 mg/dL (H)). Liver Function Tests: Recent Labs  Lab 11/27/17 0701 11/28/17 0329 11/29/17 0258 11/30/17 0322 12/01/17 0336  AST 104*  97* 168* 100* 45*  ALT 69* 56 52 43 37  ALKPHOS 71 69 84 71 65  BILITOT 0.7 0.5 0.9 0.7 0.7  PROT 5.3* 5.0* 5.4* 5.9* 6.1*  ALBUMIN 2.5* 2.3* 2.4*  2.4* 2.5* 2.5*   No results for input(s): LIPASE, AMYLASE in the last 168 hours. No results for input(s): AMMONIA in the last 168 hours. Coagulation Profile: Recent Labs  Lab 11/26/17 1229  INR 1.28   Cardiac Enzymes: No results for input(s): CKTOTAL, CKMB, CKMBINDEX, TROPONINI in the last 168 hours. BNP (last 3 results) No results for input(s): PROBNP in the last 8760 hours. HbA1C: No results for input(s): HGBA1C in the last 72 hours. CBG: No results for input(s): GLUCAP in the last 168 hours. Lipid Profile: No results for input(s): CHOL, HDL, LDLCALC, TRIG, CHOLHDL, LDLDIRECT in the last 72 hours. Thyroid Function Tests: No results for input(s): TSH, T4TOTAL, FREET4, T3FREE, THYROIDAB in the last 72 hours. Anemia Panel: No results for input(s): VITAMINB12, FOLATE, FERRITIN, TIBC, IRON,  RETICCTPCT in the last 72 hours. Sepsis Labs: Recent Labs  Lab 11/25/17 0813 11/25/17 1120 11/26/17 0335 11/27/17 0701  PROCALCITON  --  3.42 5.39 3.97  LATICACIDVEN 2.0* 2.2* 1.0  --     Recent Results (from the past 240 hour(s))  Culture, blood (Routine X 2) w Reflex to ID Panel     Status: None   Collection Time: 11/25/17  8:13 AM  Result Value Ref Range Status   Specimen Description BLOOD LEFT ANTECUBITAL  Final   Special Requests   Final    BOTTLES DRAWN AEROBIC AND ANAEROBIC Blood Culture adequate volume   Culture   Final    NO GROWTH 5 DAYS Performed at [facility], [place] [address] [place], [place] [address]    Report Status 11/30/2017 FINAL  Final  Culture, blood (Routine X 2) w Reflex to ID Panel     Status: None   Collection Time: 11/25/17  8:18 AM  Result Value Ref Range Status   Specimen Description BLOOD BLOOD LEFT FOREARM  Final   Special Requests   Final    BOTTLES DRAWN AEROBIC AND ANAEROBIC Blood Culture adequate volume   Culture   Final    NO GROWTH 5 DAYS Performed at [facility], [place] [address], [place], [place] [address]    Report Status 11/30/2017 FINAL  Final  MRSA PCR Screening     Status: None   Collection Time: 11/25/17  9:41 AM  Result Value Ref Range Status   MRSA by PCR NEGATIVE NEGATIVE Final    Comment:        The GeneXpert MRSA Assay (FDA approved for NASAL specimens only), is one component of Andres Fernandez comprehensive MRSA colonization surveillance program. It is not intended to diagnose MRSA infection nor to guide or monitor treatment for MRSA infections.   Urine Culture     Status: Abnormal   Collection Time: 11/25/17  4:05 PM  Result Value Ref Range Status   Specimen Description URINE, RANDOM  Final   Special Requests NONE  Final   Culture (Andres Fernandez)  Final    <10,000 COLONIES/mL INSIGNIFICANT GROWTH Performed at [facility], [address], [place], [place] [address]    Report Status 11/26/2017 FINAL  Final          Radiology Studies: Dg Chest Port 1 View  Result Date: 11/30/2017 CLINICAL DATA:  Short of breath. EXAM: PORTABLE CHEST 1 VIEW COMPARISON:  11/26/2017 FINDINGS: Cardiac silhouette  is top-normal in size. No mediastinal or hilar masses. No evidence of adenopathy. There are thickened bronchovascular and interstitial markings, similar to the prior study. These are increased when compared to Andres Fernandez chest radiograph dated 04/13/2015. There is no focal lung consolidation to suggest pneumonia. Probable small pleural effusions. No pneumothorax. Lytic lesion erodes the lateral left eighth rib, unchanged from the recent exam. There are also numerous small lucent bone lesions throughout the visualized skeleton. 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 IMPRESSION: 1. Similar appearance to the prior chest radiograph. Prominent bronchovascular markings and mild interstitial thickening suggests mild interstitial edema. No convincing pneumonia. Electronically Signed   By: Lajean Manes M.D.   On: 11/30/2017 15:06   US Abdomen Limited Ruq  Result Date: 11/29/2017 CLINICAL DATA:  Elevated LFTs, pericholecystic fluid by CT, history hypertension, hepatitis-C EXAM: ULTRASOUND ABDOMEN LIMITED RIGHT UPPER QUADRANT COMPARISON:  CT abdomen and pelvis 11/25/2017 FINDINGS: Gallbladder: Thickened gallbladder wall or pericholecystic fluid. No gallstones or sonographic Murphy sign identified. Common bile duct: Diameter: 4 mm diameter , normal Liver: No focal lesion identified. Within normal limits in parenchymal echogenicity. Portal vein is patent on color Doppler imaging with normal direction of blood flow towards the liver. No RIGHT upper quadrant free fluid Incidentally noted increased parenchymal echogenicity of the RIGHT kidney suggesting medical renal disease. IMPRESSION: Thickened gallbladder wall and pericholecystic fluid without definite sonographic Murphy sign or  gallstones ; findings are highly suspicious for cholecystitis. Question medical disease changes of RIGHT kidney. Electronically Signed   By: Lavonia Dana M.D.   On: 11/29/2017 13:46        Scheduled Meds: . acyclovir  200 mg Oral Daily  . allopurinol  100 mg Oral Daily  . feeding supplement (ENSURE ENLIVE)  237 mL Oral BID BM  . polyethylene glycol  17 g Oral Daily  . sodium bicarbonate  650 mg Oral TID   Continuous Infusions: . sodium chloride    . sodium chloride       LOS: 8 days    Time spent: over 20 min    Fayrene Helper, MD [facility] Pager [phone]  If 7PM-7AM, please contact night-coverage [email or web address] Password [facility] 12/01/2017, 7:33 AM

## 2017-12-01 NOTE — Plan of Care (Signed)
  Progressing Health Behavior/Discharge Planning: Ability to manage health-related needs will improve 12/01/2017 1117 - Progressing by Staci Righter, RN 12/01/2017 1109 - Progressing by Staci Righter, RN Clinical Measurements: Ability to maintain clinical measurements within normal limits will improve 12/01/2017 1117 - Progressing by Staci Righter, RN 12/01/2017 1109 - Progressing by Staci Righter, RN Will remain free from infection 12/01/2017 1109 - Progressing by Staci Righter, RN Respiratory complications will improve 12/01/2017 1117 - Progressing by Staci Righter, RN Cardiovascular complication will be avoided 12/01/2017 1117 - Progressing by Staci Righter, RN Activity: Risk for activity intolerance will decrease 12/01/2017 1109 - Progressing by Staci Righter, RN Activity: Expression of feelings of increased energy will increase 12/01/2017 1109 - Progressing by Staci Righter, RN

## 2017-12-01 NOTE — Progress Notes (Signed)
Admit: 11/23/2017 LOS: 8  44M with renal failure (time course unclear) in setting of kappa LC myeloma  Subjective:  Hypoxic yesterday with ambulation, received furosemide 80 mg IV once Serum creatinine and BUN downtrending With smoking cigarettes until admission Serum bicarbonate was 21 on 650 mg 3 times daily sodium bicarbonate  12/21 0701 - 12/22 0700 In: 720 [P.O.:720] Out: 1950 [Urine:1950]  Filed Weights   11/23/17 1443 11/25/17 0500 11/29/17 0401  Weight: 65.3 kg (144 lb) 70.1 kg (154 lb 8.7 oz) 66 kg (145 lb 8.1 oz)    Scheduled Meds: . acyclovir  200 mg Oral Daily  . allopurinol  100 mg Oral Daily  . bortezomib SQ  1.3 mg/m2 (Treatment Plan Recorded) Subcutaneous Once  . feeding supplement (ENSURE ENLIVE)  237 mL Oral BID BM  . polyethylene glycol  17 g Oral Daily  . sodium bicarbonate  650 mg Oral TID   Continuous Infusions: . sodium chloride    . sodium chloride     PRN Meds:.acetaminophen, heparin lock flush, heparin lock flush, heparin lock flush, heparin lock flush, iopamidol, lip balm, ondansetron (ZOFRAN) IV, oxyCODONE, sodium chloride flush, sodium chloride flush, sodium chloride flush, sodium chloride flush  Current Labs: reviewed  Results for Andres, Fernandez (MRN [id number]) as of 11/29/2017 12:29  Ref. Range 11/17/2017 10:47 11/23/2017 18:00  Kappa free light chain Latest Ref Range: 3.3 - 19.4 mg/L 27,634.1 (H) 13,734.8 (H)  Lamda free light chains Latest Ref Range: 5.7 - 26.3 mg/L 5.8 2.3 (L)  Kappa, lamda light chain ratio Latest Ref Range: 0.26 - 1.65  4,764.50 (H) 5,971.65 (H)   12/8 Renal US:   IMPRESSION: 1. No hydronephrosis. 2. Increased cortical echogenicity and thinned renal cortex suggesting medical renal disease .  Results for Andres, Fernandez (MRN [id number]) as of 11/29/2017 12:29  Ref. Range 11/23/2017 18:00  Hepatitis B Surface Ag Latest Ref Range: Negative  Negative  Hep B Core Ab, Tot Latest Ref Range: Negative  Negative  Test  Information Unknown Comment  Hcv Genotype Unknown Comment  HEPATITIS C GENOTYPE Unknown 1b  Hepatitis C Quantitation Latest Units: IU/mL See Final Results  HCV RNA (International Units) Latest Units: IU/mL 10,200,000  HCV log10 Latest Units: log10 IU/mL 7.009    Physical Exam:  Blood pressure 115/65, pulse 73, temperature (!) 97.5 F (36.4 C), temperature source Oral, resp. rate (!) 21, height 5\' 8"  (1.727 m), weight 66 kg (145 lb 8.1 oz), SpO2 91 %. Thin, nad RRR Coarse bs/ b/l No LEE S/nt/nd nonfocal    A 1. Renal Failure, unclear time course; slowly improving 2. Kappa LC MM; Velcade + dexmethasone 3. Hypercalcemia, resolved 4. TLS, allopurinol, rec rasburicase 5. Metabolic acidosis on NaHCO3 6. Hypoxia 7. Tobacco Dependence  P 1. 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 On velcade started 12/18; hopefully LC burden will come down 3. No info on baseline SCr, presumably was much better than this 4. Given I don't know his baseline renal function and the ambiguity of TPE data will defer on TPE currently 5. Okay with continued use of parenteral furosemide as needed for his respiratory issues, I suspect he has some COPD as well. 6. Encourage use of incentive spirometer, counseled on the importance of tobacco cessation upon discharge 7. Stop sodium bicarbonate given his hyperkalemia 8. Daily weights, Daily Renal Panel, Strict I/Os, Avoid nephrotoxins (NSAIDs, judicious IV Contrast)   Pearson Grippe MD 12/01/2017, 11:51 AM  Recent Labs  Lab 11/29/17 0258 11/30/17 0322 12/01/17 0336  NA 143  143 146* 145  K 4.2  4.2 4.4 4.6  CL 110  111 115* 112*  CO2 21*  21* 19* 21*  GLUCOSE 115*  115* 111* 118*  BUN 82*  85* 78* 77*  CREATININE 5.37*  5.37* 4.89* 4.59*  CALCIUM 7.4*  7.5* 8.0* 8.6*  PHOS 5.0*  --   --    Recent Labs  Lab 11/27/17 0701  11/29/17 0258 11/30/17 0322 12/01/17 0336  WBC 10.2   < > 10.0  9.5 8.1  NEUTROABS 5.3  --  7.4  --  5.9  HGB 9.1*   < > 8.8* 8.5* 8.4*  HCT 25.9*   < > 25.6* 24.9* 25.8*  MCV 86.9   < > 89.8 90.9 92.1  PLT 95*   < > 91* 66* 70*   < > = values in this interval not displayed.

## 2017-12-01 NOTE — Progress Notes (Signed)
Andres Fernandez   DOB:[birth year]   JE#:[id number]    Subjective: He tolerated treatment well so far.  He has minor cough.  Low-grade fever.  Denies constipation from treatment  Objective:  Vitals:   12/01/17 0600 12/01/17 0800  BP: (!) 101/55   Pulse: 68   Resp: 19   Temp:  (!) 97.5 F (36.4 C)  SpO2: 94%      Intake/Output Summary (Last 24 hours) at 12/01/2017 1043 Last data filed at 12/01/2017 0200 Gross per 24 hour  Intake 480 ml  Output 1950 ml  Net -1470 ml    GENERAL:alert, no distress and comfortable.  He looks thin and cachectic SKIN: skin color, texture, turgor are normal, no rashes or significant lesions EYES: normal, Conjunctiva are pink and non-injected, sclera clear OROPHARYNX:no exudate, no erythema and lips, buccal mucosa, and tongue normal  NECK: supple, thyroid normal size, non-tender, without nodularity LYMPH:  no palpable lymphadenopathy in the cervical, axillary or inguinal LUNGS: clear to auscultation and percussion with normal breathing effort HEART: regular rate & rhythm and no murmurs and no lower extremity edema ABDOMEN:abdomen soft, non-tender and normal bowel sounds Musculoskeletal:no cyanosis of digits and no clubbing  NEURO: alert & oriented x 3 with fluent speech, no focal motor/sensory deficits   Labs:  Lab Results  Component Value Date   WBC 8.1 12/01/2017   HGB 8.4 (L) 12/01/2017   HCT 25.8 (L) 12/01/2017   MCV 92.1 12/01/2017   PLT 70 (L) 12/01/2017   NEUTROABS 5.9 12/01/2017    Lab Results  Component Value Date   NA 145 12/01/2017   K 4.6 12/01/2017   CL 112 (H) 12/01/2017   CO2 21 (L) 12/01/2017    Studies:  Dg Chest Port 1 View  Result Date: 11/30/2017 CLINICAL DATA:  Short of breath. EXAM: PORTABLE CHEST 1 VIEW COMPARISON:  11/26/2017 FINDINGS: Cardiac silhouette is top-normal in size. No mediastinal or hilar masses. No evidence of adenopathy. There are thickened bronchovascular and interstitial markings, similar to the  prior study. These are increased when compared to a chest radiograph dated 04/13/2015. There is no focal lung consolidation to suggest pneumonia. Probable small pleural effusions. No pneumothorax. Lytic lesion erodes the lateral left eighth rib, unchanged from the recent exam. There are also numerous small lucent bone lesions throughout the visualized skeleton. 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 IMPRESSION: 1. Similar appearance to the prior chest radiograph. Prominent bronchovascular markings and mild interstitial thickening suggests mild interstitial edema. No convincing pneumonia. Electronically Signed   By: Lajean Manes M.D.   On: 11/30/2017 15:06   US Abdomen Limited Ruq  Result Date: 11/29/2017 CLINICAL DATA:  Elevated LFTs, pericholecystic fluid by CT, history hypertension, hepatitis-C EXAM: ULTRASOUND ABDOMEN LIMITED RIGHT UPPER QUADRANT COMPARISON:  CT abdomen and pelvis 11/25/2017 FINDINGS: Gallbladder: Thickened gallbladder wall or pericholecystic fluid. No gallstones or sonographic Murphy sign identified. Common bile duct: Diameter: 4 mm diameter , normal Liver: No focal lesion identified. Within normal limits in parenchymal echogenicity. Portal vein is patent on color Doppler imaging with normal direction of blood flow towards the liver. No RIGHT upper quadrant free fluid Incidentally noted increased parenchymal echogenicity of the RIGHT kidney suggesting medical renal disease. IMPRESSION: Thickened gallbladder wall and pericholecystic fluid without definite sonographic Murphy sign or gallstones ; findings are highly suspicious for cholecystitis. Question medical disease changes of RIGHT kidney. Electronically Signed   By: Lavonia Dana M.D.   On: 11/29/2017 13:46  Assessment & Plan:  Multiple myeloma He has been started on dexamethasone and Velcade I have clarified the dosing of dexamethasone with the pharmacist.  I will write for 20  mg of p.o. dexamethasone today. He will receive day 8 of Velcade today, day 11 is plan for December 04, 2017  Acquired pancytopenia His platelet count is improving.  He does not need transfusion support.  Monitor closely.  Acute renal failure Could be due to multiple myeloma Serum creatinine is stable.  He does not need hemodialysis  Tumor lysis prophylaxis Serum uric acid level is stable.  Monitor closely  Pls call if questions arise  Heath Lark, MD 12/01/2017  10:43 AM

## 2017-12-02 DIAGNOSIS — E440 Moderate protein-calorie malnutrition: Secondary | ICD-10-CM

## 2017-12-02 DIAGNOSIS — M84551A Pathological fracture in neoplastic disease, right femur, initial encounter for fracture: Secondary | ICD-10-CM

## 2017-12-02 DIAGNOSIS — R945 Abnormal results of liver function studies: Secondary | ICD-10-CM

## 2017-12-02 DIAGNOSIS — R040 Epistaxis: Secondary | ICD-10-CM

## 2017-12-02 DIAGNOSIS — R0902 Hypoxemia: Secondary | ICD-10-CM

## 2017-12-02 DIAGNOSIS — R0602 Shortness of breath: Secondary | ICD-10-CM

## 2017-12-02 LAB — COMPREHENSIVE METABOLIC PANEL
ALK PHOS: 64 U/L (ref 38–126)
ALT: 38 U/L (ref 17–63)
ANION GAP: 11 (ref 5–15)
AST: 31 U/L (ref 15–41)
Albumin: 2.5 g/dL — ABNORMAL LOW (ref 3.5–5.0)
BILIRUBIN TOTAL: 0.4 mg/dL (ref 0.3–1.2)
BUN: 86 mg/dL — ABNORMAL HIGH (ref 6–20)
CALCIUM: 8.8 mg/dL — AB (ref 8.9–10.3)
CO2: 23 mmol/L (ref 22–32)
Chloride: 111 mmol/L (ref 101–111)
Creatinine, Ser: 4.62 mg/dL — ABNORMAL HIGH (ref 0.61–1.24)
GFR, EST AFRICAN AMERICAN: 15 mL/min — AB (ref >60)
GFR, EST NON AFRICAN AMERICAN: 13 mL/min — AB (ref >60)
Glucose, Bld: 146 mg/dL — ABNORMAL HIGH (ref 65–99)
Potassium: 5.1 mmol/L (ref 3.5–5.1)
Sodium: 145 mmol/L (ref 135–145)
TOTAL PROTEIN: 6.5 g/dL (ref 6.5–8.1)

## 2017-12-02 LAB — CBC
HEMATOCRIT: 24.5 % — AB (ref 39.0–52.0)
Hemoglobin: 8.3 g/dL — ABNORMAL LOW (ref 13.0–17.0)
MCH: 31.1 pg (ref 26.0–34.0)
MCHC: 33.9 g/dL (ref 30.0–36.0)
MCV: 91.8 fL (ref 78.0–100.0)
Platelets: 77 10*3/uL — ABNORMAL LOW (ref 150–400)
RBC: 2.67 MIL/uL — ABNORMAL LOW (ref 4.22–5.81)
RDW: 15 % (ref 11.5–15.5)
WBC: 8.3 10*3/uL (ref 4.0–10.5)

## 2017-12-02 LAB — URIC ACID: Uric Acid, Serum: 1.5 mg/dL — ABNORMAL LOW (ref 4.4–7.6)

## 2017-12-02 NOTE — Progress Notes (Signed)
Admit: 11/23/2017 LOS: 9  19M with renal failure (time course unclear) in setting of kappa LC myeloma  Subjective:  Great UOP augmented to lasix yesterday No new events Serum creatinine and BUN stable; HCO3 23  12/22 0701 - 12/23 0700 In: 480 [P.O.:480] Out: 2725 [Urine:2725]  Filed Weights   11/25/17 0500 11/29/17 0401 12/02/17 0344  Weight: 70.1 kg (154 lb 8.7 oz) 66 kg (145 lb 8.1 oz) 63 kg (138 lb 14.2 oz)    Scheduled Meds: . acyclovir  200 mg Oral Daily  . allopurinol  100 mg Oral Daily  . feeding supplement (ENSURE ENLIVE)  237 mL Oral BID BM  . polyethylene glycol  17 g Oral Daily   Continuous Infusions: . sodium chloride    . sodium chloride     PRN Meds:.acetaminophen, heparin lock flush, heparin lock flush, heparin lock flush, heparin lock flush, iopamidol, lip balm, ondansetron (ZOFRAN) IV, oxyCODONE, sodium chloride flush, sodium chloride flush, sodium chloride flush, sodium chloride flush  Current Labs: reviewed  Results for DESMUND, ELMAN (MRN [id number]) as of 11/29/2017 12:29  Ref. Range 11/17/2017 10:47 11/23/2017 18:00  Kappa free light chain Latest Ref Range: 3.3 - 19.4 mg/L 27,634.1 (H) 13,734.8 (H)  Lamda free light chains Latest Ref Range: 5.7 - 26.3 mg/L 5.8 2.3 (L)  Kappa, lamda light chain ratio Latest Ref Range: 0.26 - 1.65  4,764.50 (H) 5,971.65 (H)   12/8 Renal US:   IMPRESSION: 1. No hydronephrosis. 2. Increased cortical echogenicity and thinned renal cortex suggesting medical renal disease .  Results for ARYA, BOXLEY (MRN [id number]) as of 11/29/2017 12:29  Ref. Range 11/23/2017 18:00  Hepatitis B Surface Ag Latest Ref Range: Negative  Negative  Hep B Core Ab, Tot Latest Ref Range: Negative  Negative  Test Information Unknown Comment  Hcv Genotype Unknown Comment  HEPATITIS C GENOTYPE Unknown 1b  Hepatitis C Quantitation Latest Units: IU/mL See Final Results  HCV RNA (International Units) Latest Units: IU/mL 10,200,000  HCV  log10 Latest Units: log10 IU/mL 7.009    Physical Exam:  Blood pressure (!) 107/56, pulse 65, temperature 97.6 F (36.4 C), temperature source Oral, resp. rate 17, height 5\' 8"  (1.727 m), weight 63 kg (138 lb 14.2 oz), SpO2 (!) 88 %. Thin, nad RRR Coarse bs/ b/l No LEE S/nt/nd nonfocal    A 1. Renal Failure, unclear time course; slowly improving  2. Kappa LC MM; Velcade + dexmethasone 3. Hypercalcemia, resolved 4. TLS, allopurinol, rec rasburicase 5. Metabolic acidosis off NaHCO3, serum HCO3 > 20 6. Hypoxia 7. Tobacco Dependence 8. Pancytopenia  P 1. 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 2. On velcade started 12/18; hopefully LC burden will come down 3. No info on baseline SCr, presumably was much better than this 4. Okay with continued use of parenteral furosemide as needed for his respiratory issues, I suspect he has some COPD as well. Has incentive spiorometer 5. Daily weights, Daily Renal Panel, Strict I/Os, Avoid nephrotoxins (NSAIDs, judicious IV Contrast)   Pearson Grippe MD 12/02/2017, 11:02 AM  Recent Labs  Lab 11/29/17 0258 11/30/17 0322 12/01/17 0336 12/02/17 0314  NA 143  143 146* 145 145  K 4.2  4.2 4.4 4.6 5.1  CL 110  111 115* 112* 111  CO2 21*  21* 19* 21* 23  GLUCOSE 115*  115* 111* 118* 146*  BUN 82*  85* 78* 77* 86*  CREATININE 5.37*  5.37* 4.89* 4.59* 4.62*  CALCIUM 7.4*  7.5* 8.0* 8.6* 8.8*  PHOS 5.0*  --   --   --    Recent Labs  Lab 11/27/17 0701  11/29/17 0258 11/30/17 0322 12/01/17 0336 12/02/17 0314  WBC 10.2   < > 10.0 9.5 8.1 8.3  NEUTROABS 5.3  --  7.4  --  5.9  --   HGB 9.1*   < > 8.8* 8.5* 8.4* 8.3*  HCT 25.9*   < > 25.6* 24.9* 25.8* 24.5*  MCV 86.9   < > 89.8 90.9 92.1 91.8  PLT 95*   < > 91* 66* 70* 77*   < > = values in this interval not displayed.

## 2017-12-02 NOTE — Progress Notes (Signed)
Initial Nutrition Assessment  DOCUMENTATION CODES:   Non-severe (moderate) malnutrition in context of acute illness/injury  INTERVENTION:   -Continue Ensure Enlive po BID, each supplement provides 350 kcal and 20 grams of protein -Provide Magic cup BID with meals, each supplement provides 290 kcal and 9 grams of protein -Encourage PO intake -RD will continue to monitor  NUTRITION DIAGNOSIS:   Moderate Malnutrition related to cancer and cancer related treatments, acute illness as evidenced by percent weight loss, moderate fat depletion, moderate muscle depletion.  GOAL:   Patient will meet greater than or equal to 90% of their needs  MONITOR:   PO intake, Supplement acceptance, Weight trends, Labs, I & O's  REASON FOR ASSESSMENT:   Malnutrition Screening Tool    ASSESSMENT:   61yom discharged 12/13, workup for suspected multiple myeloma with BM biopsy pending on discharge; 12/14 BM biopsy results available, oncology requested direct admit for aggressive inpt tx. Chemotherapy initiated 12/15. 12/16 decompensated with fever, tachycardia, hypotension; started on abx. Treated for TLS. Nephrology consulted for worsening creatinine.  Patient in bed, appears uncomfortable. Pt reports not feeling well but still has a good appetite. Per chart review, pt has not been ordering meals today and has not eaten since yesterday for lunch in which he consumed 1/2 of his lunch of pot roast, green beans and a roll. Pt was eating better prior to 12/18.  Pt is drinking the Ensure supplements and likes them. Will continue order and add Magic Cups with meals as well.  Per patient, UBW is 158 lb. Pt has lost 19 lb since 12/8 (12% wt loss x 2 weeks, significant for time frame).  Medications: Miralax packet daily Labs reviewed: GFR: 15   NUTRITION - FOCUSED PHYSICAL EXAM:    Most Recent Value  Orbital Region  Mild depletion  Upper Arm Region  Moderate depletion  Thoracic and Lumbar Region  Unable  to assess  Buccal Region  Mild depletion  Temple Region  Mild depletion  Clavicle Bone Region  Moderate depletion  Clavicle and Acromion Bone Region  Moderate depletion  Scapular Bone Region  Moderate depletion  Dorsal Hand  No depletion  Patellar Region  Unable to assess  Anterior Thigh Region  Unable to assess  Posterior Calf Region  Unable to assess  Edema (RD Assessment)  None       Diet Order:  Diet regular Room service appropriate? Yes; Fluid consistency: Thin  EDUCATION NEEDS:   Education needs have been addressed  Skin:  Skin Assessment: Reviewed RN Assessment  Last BM:  12/20  Height:   Ht Readings from Last 1 Encounters:  11/23/17 _0  (1.727 m)    Weight:   Wt Readings from Last 1 Encounters:  12/02/17 138 lb 14.2 oz (63 kg)    Ideal Body Weight:  70 kg  BMI:  Body mass index is 21.12 kg/m.  Estimated Nutritional Needs:   Kcal:  1800-2000  Protein:  95-105g  Fluid:  2L/day  Clayton Bibles, MS, RD, LDN [facility] Pager: [phone] After Hours Pager: [phone]

## 2017-12-02 NOTE — Progress Notes (Addendum)
PROGRESS NOTE    Andres Fernandez  YTK:[id number] DOB: 1956/04/10 DOA: 11/23/2017 PCP: Patient, No Pcp Per   Brief Narrative:  58yom discharged 12/13, workup for suspected multiple myeloma with BM biopsy pending on discharge; 12/14 BM biopsy results available, oncology requested direct admit for aggressive inpt tx. Chemotherapy initiated 12/15. 12/16 decompensated with fever, tachycardia, hypotension; started on abx. Treated for tumor lysis syndrome. Nephrology consulted for worsening creatinine.  Nose bleed reported which has now resolved. Pt seen and examined at his bedside. Feels weak. No other complaints.  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 - continue management per oncology - Chemotherapy initiated 12/15. Sp Rasburicase 12/17 - Continue allopurinol. - daily uric acid levels  - Velcade per oncology   Acute Hypoxic Resp Failure, resolving: -Wean O2 as tolerated -O2 supplementation to maintain O2 sat 92% or greater  Epistaxis -self reported after blowing his nose 12/02/17 -bleeding stopped with pressure -Monitor -CBC am  AKI with suspicion for myeloma induced nephropathy  - adequate UOP. K+ normal. - cr 4.62 from 4.59 (3.80 seven days ago) - continue bicarb, NAGMA mild - continue management per nephrology, appreciate recs  SIRS, resolved.  -Resolved -afebrile -CT abd/pelvis with pericholecystic fluid -Lactic back to normal  -CXR right base atelectasis vs pneumonia  but no infiltrate on chest CT - no abd symptoms or RUQ pain. Transaminases likely up from hepatitis C. -RUQ [name] with thickened gallbladder wall and pericholecystic fluid without sonographic murphy or gallstones.  -Partner discussed with surgery on 12/20 who noted may be related to low albumin, but without symptoms - d/c cefepime and follow.    Anemia of malignancy. - s/p 2 units PRBC 12/15, 1 unit 12/17. - Hgb remains stable - follow CBC. Heparin stopped.  Thrombocytopenia - Daily CBC - Suspect secondary to chemotherapy. Management per oncology. - heparin stopped 12/18.  Hepatitis C, new dx. - f/u with GI as an outpt  Elevated transaminases - isolated, CT liver appearance unremarkable. Likely secondary to hepatitis C. Follow clinically. - RUQ [name] as above  Pathologic right femur fx s/p intramedullary nail 12/11 - WBAT per ortho. F/u end of year for suture removal. 6 weeks day ASA 325 for DVT proph per ortho-hold  Essential HTN. -BP stable -holding amlodipine.   Constipation - resolving - continue bowel regimen  DVT prophylaxis: SCD Code Status: full  Family Communication: none at bedside Disposition Plan: will stay another midnight to continue current management   Consultants:   Oncology  nephrology   Procedures  2 units pRBC 12/15, 1 unit pRBC 12/17   Antimicrobials:   Cefepime 12/17 -> 12/19  Vancomycin 12/17 -> 12/18   Acyclovir po 12/14 >>>   Objective: Vitals:   12/02/17 0200 12/02/17 0344 12/02/17 0400 12/02/17 0600  BP: 103/74  110/63 118/73  Pulse: 67  66 68  Resp: 15  (!) 21 (!) 21  Temp:  97.6 F (36.4 C)    TempSrc:  Oral    SpO2: 92%  94%  92%  Weight:  63 kg (138 lb 14.2 oz)    Height:        Intake/Output Summary (Last 24 hours) at 12/02/2017 0724 Last data filed at 12/02/2017 0500 Gross per 24 hour  Intake 480 ml  Output 2725 ml  Net -2245 ml   Filed Weights   11/25/17 0500 11/29/17 0401 12/02/17 0344  Weight: 70.1  kg (154 lb 8.7 oz) 66 kg (145 lb 8.1 oz) 63 kg (138 lb 14.2 oz)    Examination:  General: No acute distress. Cardiovascular: Heart sounds show a regular rate, and rhythm. No gallops or rubs. No murmurs.  Lungs: Clear to auscultation bilaterally with good air movement. No rales, rhonchi or wheezes. Abdomen: Soft, nontender, nondistended with normal active bowel sounds. No masses. No hepatosplenomegaly. Neurological: Alert and oriented 3. Moves all extremities 4 with equal strength. Cranial nerves II through XII grossly intact. Skin: Warm and dry. No rashes or lesions. Extremities: No clubbing or cyanosis. No edema. Trace sacral edema. Psychiatric: Mood and affect are normal. Insight and judgment are appropriate.  Data Reviewed: I have personally reviewed following labs and imaging studies  CBC: Recent Labs  Lab 11/26/17 0335 11/27/17 0701 11/28/17 0329 11/29/17 0258 11/30/17 0322 12/01/17 0336 12/02/17 0314  WBC 8.6 10.2 11.0* 10.0 9.5 8.1 8.3  NEUTROABS 6.5 5.3  --  7.4  --  5.9  --   HGB 7.7* 9.1* 8.8* 8.8* 8.5* 8.4* 8.3*  HCT 22.6* 25.9* 26.0* 25.6* 24.9* 25.8* 24.5*  MCV 87.9 86.9 88.4 89.8 90.9 92.1 91.8  PLT 126* 95* 98* 91* 66* 70* 77*   Basic Metabolic Panel: Recent Labs  Lab 11/28/17 0329 11/29/17 0258 11/30/17 0322 12/01/17 0336 12/02/17 0314  NA 141 143  143 146* 145 145  K 4.1 4.2  4.2 4.4 4.6 5.1  CL 107 110  111 115* 112* 111  CO2 23 21*  21* 19* 21* 23  GLUCOSE 127* 115*  115* 111* 118* 146*  BUN 82* 82*  85* 78* 77* 86*  CREATININE 5.12* 5.37*  5.37* 4.89* 4.59* 4.62*  CALCIUM 7.1* 7.4*  7.5* 8.0* 8.6* 8.8*  PHOS  --  5.0*  --   --   --    GFR: Estimated Creatinine Clearance: 15.2 mL/min (A) (by C-G formula based on SCr of 4.62 mg/dL (H)). Liver Function Tests: Recent Labs  Lab 11/28/17 0329 11/29/17 0258 11/30/17 0322 12/01/17 0336 12/02/17 0314  AST 97* 168* 100* 45* 31  ALT 56 52 43 37 38  ALKPHOS 69 84 71 65 64  BILITOT 0.5  0.9 0.7 0.7 0.4  PROT 5.0* 5.4* 5.9* 6.1* 6.5  ALBUMIN 2.3* 2.4*  2.4* 2.5* 2.5* 2.5*   No results for input(s): LIPASE, AMYLASE in the last 168 hours. No results for input(s): AMMONIA in the last 168 hours. Coagulation Profile: Recent Labs  Lab 11/26/17 1229 12/01/17 0724  INR 1.28 0.97   Cardiac Enzymes: No results for input(s): CKTOTAL, CKMB, CKMBINDEX, TROPONINI in the last 168 hours. BNP (last 3 results) No results for input(s): PROBNP in the last 8760 hours. HbA1C: No results for input(s): HGBA1C in the last 72 hours. CBG: No results for input(s): GLUCAP in the last 168 hours. Lipid Profile: No results for input(s): CHOL, HDL, LDLCALC, TRIG, CHOLHDL, LDLDIRECT in the last 72 hours. Thyroid Function Tests: No results for input(s): TSH, T4TOTAL, FREET4, T3FREE, THYROIDAB in the last 72 hours. Anemia Panel: No results for input(s): VITAMINB12, FOLATE, FERRITIN, TIBC, IRON, RETICCTPCT in  the last 72 hours. Sepsis Labs: Recent Labs  Lab 11/25/17 0813 11/25/17 1120 11/26/17 0335 11/27/17 0701  PROCALCITON  --  3.42 5.39 3.97  LATICACIDVEN 2.0* 2.2* 1.0  --     Recent Results (from the past 240 hour(s))  Culture, blood (Routine X 2) w Reflex to ID Panel     Status: None   Collection Time: 11/25/17  8:13 AM  Result Value Ref Range Status   Specimen Description BLOOD LEFT ANTECUBITAL  Final   Special Requests   Final    BOTTLES DRAWN AEROBIC AND ANAEROBIC Blood Culture adequate volume   Culture   Final    NO GROWTH 5 DAYS Performed at [facility], [place] [address], [place], [place] [address]    Report Status 11/30/2017 FINAL  Final  Culture, blood (Routine X 2) w Reflex to ID Panel     Status: None   Collection Time: 11/25/17  8:18 AM  Result Value Ref Range Status   Specimen Description BLOOD BLOOD LEFT FOREARM  Final   Special Requests   Final    BOTTLES DRAWN AEROBIC AND ANAEROBIC Blood Culture adequate volume   Culture   Final    NO GROWTH 5  DAYS Performed at [facility], [place] [address] [place], [place] [address]    Report Status 11/30/2017 FINAL  Final  MRSA PCR Screening     Status: None   Collection Time: 11/25/17  9:41 AM  Result Value Ref Range Status   MRSA by PCR NEGATIVE NEGATIVE Final    Comment:        The GeneXpert MRSA Assay (FDA approved for NASAL specimens only), is one component of a comprehensive MRSA colonization surveillance program. It is not intended to diagnose MRSA infection nor to guide or monitor treatment for MRSA infections.   Urine Culture     Status: Abnormal   Collection Time: 11/25/17  4:05 PM  Result Value Ref Range Status   Specimen Description URINE, RANDOM  Final   Special Requests NONE  Final   Culture (A)  Final    <10,000 COLONIES/mL INSIGNIFICANT GROWTH Performed at [facility], [address] [place],  [address]    Report Status 11/26/2017 FINAL  Final         Radiology Studies: Dg Chest Port 1 View  Result Date: 11/30/2017 CLINICAL DATA:  Short of breath. EXAM: PORTABLE CHEST 1 VIEW COMPARISON:  11/26/2017 FINDINGS: Cardiac silhouette is top-normal in size. No mediastinal or hilar masses. No evidence of adenopathy. There are thickened bronchovascular and interstitial markings, similar to the prior study. These are increased when compared to a chest radiograph dated 04/13/2015. There is no focal lung consolidation to suggest pneumonia. Probable small pleural effusions. No pneumothorax. Lytic lesion erodes the lateral left eighth rib, unchanged from the recent exam. There are also numerous small lucent bone lesions throughout the visualized skeleton. 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 IMPRESSION: 1. Similar appearance to the prior chest radiograph. Prominent bronchovascular markings and mild interstitial thickening suggests mild interstitial edema. No convincing pneumonia. Electronically Signed   By:  Lajean Manes M.D.   On: 11/30/2017 15:06        Scheduled Meds: . acyclovir  200 mg Oral Daily  . allopurinol  100 mg Oral Daily  . feeding supplement (ENSURE ENLIVE)  237 mL Oral BID BM  . polyethylene glycol  17 g Oral Daily   Continuous Infusions: .  sodium chloride    . sodium chloride       LOS: 9 days    Time spent: over 20 min    Kayleen Memos, MD [facility] Pager [phone]  If 7PM-7AM, please contact night-coverage [email or web address] Password [facility] 12/02/2017, 7:24 AM

## 2017-12-03 DIAGNOSIS — R200 Anesthesia of skin: Secondary | ICD-10-CM

## 2017-12-03 DIAGNOSIS — R52 Pain, unspecified: Secondary | ICD-10-CM

## 2017-12-03 LAB — BASIC METABOLIC PANEL
ANION GAP: 12 (ref 5–15)
BUN: 99 mg/dL — AB (ref 6–20)
CALCIUM: 8.8 mg/dL — AB (ref 8.9–10.3)
CO2: 19 mmol/L — ABNORMAL LOW (ref 22–32)
Chloride: 111 mmol/L (ref 101–111)
Creatinine, Ser: 4.39 mg/dL — ABNORMAL HIGH (ref 0.61–1.24)
GFR calc Af Amer: 15 mL/min — ABNORMAL LOW (ref >60)
GFR, EST NON AFRICAN AMERICAN: 13 mL/min — AB (ref >60)
Glucose, Bld: 118 mg/dL — ABNORMAL HIGH (ref 65–99)
POTASSIUM: 5.3 mmol/L — AB (ref 3.5–5.1)
SODIUM: 142 mmol/L (ref 135–145)

## 2017-12-03 LAB — KAPPA/LAMBDA LIGHT CHAINS
KAPPA FREE LGHT CHN: 6931.2 mg/L — AB (ref 3.3–19.4)
Kappa, lambda light chain ratio: 707.27 — ABNORMAL HIGH (ref 0.26–1.65)
Lambda free light chains: 9.8 mg/L (ref 5.7–26.3)

## 2017-12-03 LAB — CBC
HCT: 23.7 % — ABNORMAL LOW (ref 39.0–52.0)
Hemoglobin: 7.8 g/dL — ABNORMAL LOW (ref 13.0–17.0)
MCH: 29.9 pg (ref 26.0–34.0)
MCHC: 32.9 g/dL (ref 30.0–36.0)
MCV: 90.8 fL (ref 78.0–100.0)
PLATELETS: 65 10*3/uL — AB (ref 150–400)
RBC: 2.61 MIL/uL — AB (ref 4.22–5.81)
RDW: 14.7 % (ref 11.5–15.5)
WBC: 3.9 10*3/uL — AB (ref 4.0–10.5)

## 2017-12-03 MED ORDER — SODIUM POLYSTYRENE SULFONATE 15 GM/60ML PO SUSP
15.0000 g | Freq: Once | ORAL | Status: AC
  Administered 2017-12-03: 15 g via ORAL
  Filled 2017-12-03: qty 60

## 2017-12-03 NOTE — Progress Notes (Signed)
PROGRESS NOTE    Andres Fernandez  TFT:[id number] DOB: 1956-10-04 DOA: 11/23/2017 PCP: Patient, No Pcp Per   Brief Narrative:  40yom discharged 12/13, workup for suspected multiple myeloma with BM biopsy pending on discharge; 12/14 BM biopsy results available, oncology requested direct admit for aggressive inpt tx. Chemotherapy initiated 12/15. 12/16 decompensated with fever, tachycardia, hypotension; started on abx. Treated for tumor lysis syndrome. Nephrology consulted for worsening creatinine.  No acute events overnight. Has no complaints.   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 - continue management per oncology - Chemotherapy initiated 12/15. Sp Rasburicase 12/17 - Continue allopurinol. - daily uric acid levels  - Velcade per oncology   Acute Hypoxic Resp Failure, resolving: -Wean O2 as tolerated -O2 supplementation to maintain O2 sat 92% or greater  Epistaxis, resolved -self reported after blowing his nose 12/02/17 -bleeding stopped with pressure -Monitor -CBC am  AKI with suspicion for myeloma induced nephropathy  - adequate UOP. K+ normal. - cr 4.62 from 4.59 (3.80 seven days ago) - continue bicarb, NAGMA mild - continue management per nephrology, appreciate recs  SIRS, resolved.  -Resolved -afebrile -CT abd/pelvis with pericholecystic fluid -Lactic back to normal  -CXR right base atelectasis vs pneumonia but no infiltrate on  chest CT - no abd symptoms or RUQ pain. Transaminases likely up from hepatitis C. -RUQ [name] with thickened gallbladder wall and pericholecystic fluid without sonographic murphy or gallstones.  -Partner discussed with surgery on 12/20 who noted may be related to low albumin, but without symptoms - d/c cefepime and follow.    Anemia of malignancy. - s/p 2 units PRBC 12/15, 1 unit 12/17. - Hgb remains stable - follow CBC. Heparin stopped.  Thrombocytopenia - Daily CBC - Suspect secondary to chemotherapy. Management per oncology. - heparin stopped 12/18.  Hepatitis C, new dx. - f/u with GI as an outpt  Elevated transaminases - isolated, CT liver appearance unremarkable. Likely secondary to hepatitis C. Follow clinically. - RUQ [name] as above  Pathologic right femur fx s/p intramedullary nail 12/11 - WBAT per ortho. F/u end of year for suture removal. 6 weeks day ASA 325 for DVT proph per ortho-hold  Essential HTN. -BP stable -holding amlodipine.   Constipation - resolving - continue bowel regimen  DVT prophylaxis: SCD Code Status: full  Family Communication: none at bedside Disposition Plan: will stay another midnight to continue current management   Consultants:   Oncology  nephrology   Procedures  2 units pRBC 12/15, 1 unit pRBC 12/17   Antimicrobials:   Cefepime 12/17 -> 12/19  Vancomycin 12/17 -> 12/18   Acyclovir po 12/14 >>>   Objective: Vitals:   12/02/17 2200 12/03/17 0000 12/03/17 0330 12/03/17 0400  BP: 109/63 114/84  (!) 105/42  Pulse: 64 65  79  Resp: 20 (!) 22  (!) 24  Temp:  98.3 F (36.8 C) 98.4 F (36.9 C)   TempSrc:  Oral Oral   SpO2: 95% 91%  91%  Weight:      Height:        Intake/Output Summary (Last 24 hours) at 12/03/2017 0728 Last data filed at 12/03/2017 0630 Gross per 24 hour  Intake -  Output 2100 ml  Net -2100 ml   Filed Weights   11/25/17 0500 11/29/17 0401 12/02/17 0344  Weight: 70.1 kg (154 lb 8.7 oz) 66 kg  (145 lb 8.1 oz) 63 kg (138 lb 14.2 oz)    Examination:  General: No acute distress. Cardiovascular: Heart sounds show a regular rate, and rhythm. No gallops or rubs. No murmurs.  Lungs: Clear to auscultation bilaterally with good air movement. No rales, rhonchi or wheezes. Abdomen: Soft, nontender, nondistended with normal active bowel sounds. No masses. No hepatosplenomegaly. Neurological: Alert and oriented 3. Moves all extremities 4 with equal strength. Cranial nerves II through XII grossly intact. Skin: Warm and dry. No rashes or lesions. Extremities: No clubbing or cyanosis. No edema. Trace sacral edema. Psychiatric: Mood and affect are normal. Insight and judgment are appropriate.  Data Reviewed: I have personally reviewed following labs and imaging studies  CBC: Recent Labs  Lab 11/27/17 0701  11/29/17 0258 11/30/17 0322 12/01/17 0336 12/02/17 0314 12/03/17 0330  WBC 10.2   < > 10.0 9.5 8.1 8.3 3.9*  NEUTROABS 5.3  --  7.4  --  5.9  --   --   HGB 9.1*   < > 8.8* 8.5* 8.4* 8.3* 7.8*  HCT 25.9*   < > 25.6* 24.9* 25.8* 24.5* 23.7*  MCV 86.9   < > 89.8 90.9 92.1 91.8 90.8  PLT 95*   < > 91* 66* 70* 77* 65*   < > = values in this interval not displayed.   Basic Metabolic Panel: Recent Labs  Lab 11/29/17 0258 11/30/17 0322 12/01/17 0336 12/02/17 0314 12/03/17 0330  NA 143  143 146* 145 145 142  K 4.2  4.2 4.4 4.6 5.1 5.3*  CL 110  111 115* 112* 111 111  CO2 21*  21* 19* 21* 23 19*  GLUCOSE 115*  115* 111* 118* 146* 118*  BUN 82*  85* 78* 77* 86* 99*  CREATININE 5.37*  5.37* 4.89* 4.59* 4.62* 4.39*  CALCIUM 7.4*  7.5* 8.0* 8.6* 8.8* 8.8*  PHOS 5.0*  --   --   --   --    GFR: Estimated Creatinine Clearance: 15.9 mL/min (A) (by C-G formula based on SCr of 4.39 mg/dL (H)). Liver Function Tests: Recent Labs  Lab 11/28/17 0329 11/29/17 0258 11/30/17 0322 12/01/17 0336 12/02/17 0314  AST 97* 168* 100* 45* 31  ALT 56 52 43 37 38  ALKPHOS 69 84 71 65  64  BILITOT 0.5 0.9 0.7 0.7 0.4  PROT 5.0* 5.4* 5.9* 6.1* 6.5  ALBUMIN 2.3* 2.4*  2.4* 2.5* 2.5* 2.5*   No results for input(s): LIPASE, AMYLASE in the last 168 hours. No results for input(s): AMMONIA in the last 168 hours. Coagulation Profile: Recent Labs  Lab 11/26/17 1229 12/01/17 0724  INR 1.28 0.97   Cardiac Enzymes: No results for input(s): CKTOTAL, CKMB, CKMBINDEX, TROPONINI in the last 168 hours. BNP (last 3 results) No results for input(s): PROBNP in the last 8760 hours. HbA1C: No results for input(s): HGBA1C in the last 72 hours. CBG: No results for input(s): GLUCAP in the last 168 hours. Lipid Profile: No results for input(s): CHOL, HDL, LDLCALC, TRIG, CHOLHDL, LDLDIRECT in the last 72 hours. Thyroid Function Tests: No results for input(s): TSH, T4TOTAL,  FREET4, T3FREE, THYROIDAB in the last 72 hours. Anemia Panel: No results for input(s): VITAMINB12, FOLATE, FERRITIN, TIBC, IRON, RETICCTPCT in the last 72 hours. Sepsis Labs: Recent Labs  Lab 11/27/17 0701  PROCALCITON 3.97    Recent Results (from the past 240 hour(s))  Culture, blood (Routine X 2) w Reflex to ID Panel     Status: None   Collection Time: 11/25/17  8:13 AM  Result Value Ref Range Status   Specimen Description BLOOD LEFT ANTECUBITAL  Final   Special Requests   Final    BOTTLES DRAWN AEROBIC AND ANAEROBIC Blood Culture adequate volume   Culture   Final    NO GROWTH 5 DAYS Performed at [facility], [address], [place], [place] [address]    Report Status 11/30/2017 FINAL  Final  Culture, blood (Routine X 2) w Reflex to ID Panel     Status: None   Collection Time: 11/25/17  8:18 AM  Result Value Ref Range Status   Specimen Description BLOOD BLOOD LEFT FOREARM  Final   Special Requests   Final    BOTTLES DRAWN AEROBIC AND ANAEROBIC Blood Culture adequate volume   Culture   Final    NO GROWTH 5 DAYS Performed at [facility], [place] [address] [place], [place] [address]     Report Status 11/30/2017 FINAL  Final  MRSA PCR Screening     Status: None   Collection Time: 11/25/17  9:41 AM  Result Value Ref Range Status   MRSA by PCR NEGATIVE NEGATIVE Final    Comment:        The GeneXpert MRSA Assay (FDA approved for NASAL specimens only), is one component of a comprehensive MRSA colonization surveillance program. It is not intended to diagnose MRSA infection nor to guide or monitor treatment for MRSA infections.   Urine Culture     Status: Abnormal   Collection Time: 11/25/17  4:05 PM  Result Value Ref Range Status   Specimen Description URINE, RANDOM  Final   Special Requests NONE  Final   Culture (A)  Final    <10,000 COLONIES/mL INSIGNIFICANT GROWTH Performed at [facility], [address], [place], [place] [address]    Report Status 11/26/2017 FINAL  Final         Radiology Studies: No results found.      Scheduled Meds: . acyclovir  200 mg Oral Daily  . allopurinol  100 mg Oral Daily  . feeding supplement (ENSURE ENLIVE)  237 mL Oral BID BM  . polyethylene glycol  17 g Oral Daily   Continuous Infusions: . sodium chloride    . sodium chloride       LOS: 10 days    Time spent: over 20 min    Kayleen Memos, MD [facility] Pager [phone]  If 7PM-7AM, please contact night-coverage [email or web address] Password [facility] 12/03/2017, 7:28 AM

## 2017-12-03 NOTE — Progress Notes (Signed)
[facility] Progress Note  Subjective: up in chair, denies SOB, CP, abd pain, n/v/d.   Vitals:   12/03/17 0800 12/03/17 0900 12/03/17 1000 12/03/17 1200  BP: 112/68  135/64 113/72  Pulse: 75 72 70 85  Resp: (!) 22 (!) 22 (!) 25 (!) 23  Temp: 97.7 F (36.5 C)   (!) 97.2 F (36.2 C)  TempSrc: Oral   Oral  SpO2: 92% 97% (!) 89% 95%  Weight:      Height:        Inpatient medications: . acyclovir  200 mg Oral Daily  . allopurinol  100 mg Oral Daily  . feeding supplement (ENSURE ENLIVE)  237 mL Oral BID BM  . polyethylene glycol  17 g Oral Daily   . sodium chloride    . sodium chloride     acetaminophen, heparin lock flush, heparin lock flush, heparin lock flush, heparin lock flush, iopamidol, lip balm, ondansetron (ZOFRAN) IV, oxyCODONE, sodium chloride flush, sodium chloride flush, sodium chloride flush, sodium chloride flush  Exam: Thin AAM, nad No jvd Chest clear bilat RRR no mrg Abd soft ntnd Ext no edema NF, Ox3  CXR 12/23 - improved IS edema compared to 12/20  Impression: 1. 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   Some slow improvement in creatinine.  No TPE due to marginal data showing improvement, and lack known time course of renal failure in this patient. Getting chemoRx for MM/ PCL, started velcade 12/18.  2. MM/ PCL - per ONC 3. HyperCa++ - resolved 4. Met acidosis - resolving 5. Hypoxia 6. Pancytopenia - due to malignancy most likely  Plan - will follow   Kelly Splinter MD [place] pager [phone]   12/03/2017, 12:42 PM   Recent Labs  Lab 11/29/17 0258  12/01/17 0336 12/02/17 0314 12/03/17 0330  NA 143  143   < > 145 145 142  K 4.2  4.2   < > 4.6 5.1 5.3*  CL 110  111   < > 112* 111 111  CO2 21*  21*   < > 21* 23 19*  GLUCOSE 115*  115*   < > 118* 146* 118*  BUN 82*  85*   < > 77* 86* 99*  CREATININE 5.37*  5.37*   < > 4.59* 4.62* 4.39*  CALCIUM 7.4*  7.5*   < > 8.6* 8.8* 8.8*   PHOS 5.0*  --   --   --   --    < > = values in this interval not displayed.   Recent Labs  Lab 11/30/17 0322 12/01/17 0336 12/02/17 0314  AST 100* 45* 31  ALT 43 37 38  ALKPHOS 71 65 64  BILITOT 0.7 0.7 0.4  PROT 5.9* 6.1* 6.5  ALBUMIN 2.5* 2.5* 2.5*   Recent Labs  Lab 11/27/17 0701  11/29/17 0258  12/01/17 0336 12/02/17 0314 12/03/17 0330  WBC 10.2   < > 10.0   < > 8.1 8.3 3.9*  NEUTROABS 5.3  --  7.4  --  5.9  --   --   HGB 9.1*   < > 8.8*   < > 8.4* 8.3* 7.8*  HCT 25.9*   < > 25.6*   < > 25.8* 24.5* 23.7*  MCV 86.9   < > 89.8   < > 92.1 91.8 90.8  PLT 95*   < > 91*   < > 70* 77* 65*   < > = values in this  interval not displayed.   Iron/TIBC/Ferritin/ %Sat No results found for: IRON, TIBC, FERRITIN, IRONPCTSAT

## 2017-12-03 NOTE — Progress Notes (Signed)
Physical Therapy Treatment Patient Details Name: Andres Fernandez MRN: [id number] DOB: April 02, 1956 Today's Date: 12/03/2017    History of Present Illness 61 yo male admitted with fever, tachycardia. hx of multiple myeloma, drug abuse, R femur IM nail 11/20/17    PT Comments    Pt assisted to bathroom and then ambulated in hallway.  Pt very slow with mobility and required supplemental oxygen during ambulation (see ambulation section below for further details).  Follow Up Recommendations  SNF;Supervision/Assistance - 24 hour     Equipment Recommendations  None recommended by PT    Recommendations for Other Services       Precautions / Restrictions Precautions Precautions: Fall Precaution Comments: monitor oxygen, may need O2 Restrictions RLE Weight Bearing: Weight bearing as tolerated    Mobility  Bed Mobility Overal bed mobility: Needs Assistance       Supine to sit: Supervision     General bed mobility comments: supervision for lines only  Transfers Overall transfer level: Needs assistance Equipment used: None Transfers: Sit to/from Stand Sit to Stand: Min guard         General transfer comment: min/guard for safety, performed without RW and pt very unsteady  Ambulation/Gait Ambulation/Gait assistance: Min assist;+2 safety/equipment Ambulation Distance (Feet): 76 Feet Assistive device: Rolling walker (2 wheeled) Gait Pattern/deviations: Step-through pattern;Step-to pattern;Trunk flexed;Decreased stance time - right;Antalgic     General Gait Details: pt very unsteady without UE support and attempting to hold bed and wall upon assist to bathroom so provided RW after toileting, slow and unsteady gait, SpO2 dropped to 81% on room air so reapplied 2L O2 [place] and improved to 91% (97% on 2L O2 upon leaving room)  Pt also reports R hip pain with ambulation likely causing antalgic gait (recent R IM nail)   Stairs            Wheelchair Mobility    Modified  Rankin (Stroke Patients Only)       Balance                                            Cognition Arousal/Alertness: Awake/alert Behavior During Therapy: WFL for tasks assessed/performed Overall Cognitive Status: Within Functional Limits for tasks assessed                                        Exercises      General Comments General comments (skin integrity, edema, etc.): Pt encouraged to take multiple standing rest breaks for breathing as he tends to want to push through and continue.  Pt shaky towards end of ambulation.      Pertinent Vitals/Pain Pain Assessment: Faces Faces Pain Scale: Hurts little more Pain Location: R LE Pain Descriptors / Indicators: Discomfort Pain Intervention(s): Limited activity within patient's tolerance;Monitored during session;Repositioned;Premedicated before session    Home Living                      Prior Function            PT Goals (current goals can now be found in the care plan section) Progress towards PT goals: Progressing toward goals    Frequency    Min 3X/week      PT Plan Current plan remains appropriate    Co-evaluation  AM-PAC PT "6 Clicks" Daily Activity  Outcome Measure  Difficulty turning over in bed (including adjusting bedclothes, sheets and blankets)?: None Difficulty moving from lying on back to sitting on the side of the bed? : None Difficulty sitting down on and standing up from a chair with arms (e.g., wheelchair, bedside commode, etc,.)?: A Little Help needed moving to and from a bed to chair (including a wheelchair)?: A Little Help needed walking in hospital room?: A Little Help needed climbing 3-5 steps with a railing? : A Lot 6 Click Score: 19    End of Session Equipment Utilized During Treatment: Gait belt;Oxygen Activity Tolerance: Patient tolerated treatment well Patient left: in chair;with chair alarm set;with call bell/phone  within reach Nurse Communication: Mobility status PT Visit Diagnosis: Muscle weakness (generalized) (M62.81);Pain;Difficulty in walking, not elsewhere classified (R26.2) Pain - Right/Left: Right Pain - part of body: Leg     Time: 5170-0174 PT Time Calculation (min) (ACUTE ONLY): 28 min  Charges:  $Gait Training: 23-37 mins                    G Codes:      Carmelia Bake, PT, DPT 12/03/2017 Pager: [phone]  York Ram E 12/03/2017, 1:16 PM

## 2017-12-03 NOTE — Progress Notes (Signed)
Date: December 03, 2017 Velva Harman, BSN, [place], [place] Chart and notes review for patient progress and needs. Will follow for case management and discharge needs. Next review date: [id number]

## 2017-12-03 NOTE — Progress Notes (Signed)
IP PROGRESS NOTE  Subjective:   Andres Fernandez has no complaint.  He denies nausea and diarrhea.  He completed day 8 Velcade 12/01/2017.  He received Decadron on 12/01/2017.   Objective: Vital signs in last 24 hours: Blood pressure (!) 105/42, pulse 79, temperature 98.4 F (36.9 C), temperature source Oral, resp. rate (!) 24, height 5' 8"  (1.727 m), weight 138 lb 14.2 oz (63 kg), SpO2 91 %.  Intake/Output from previous day: 12/23 0701 - 12/24 0700 In: -  Out: 2100 [Urine:2100]  Physical Exam:  HEENT: Mild white coat over the tongue, no buccal thrush Lungs: Decreased breath sounds at the right upper posterior chest, no respiratory distress Cardiac: Regular rate and rhythm Abdomen: Nontender, no hepatosplenomegaly Extremities: No leg edema   Lab Results: Recent Labs    12/02/17 0314 12/03/17 0330  WBC 8.3 3.9*  HGB 8.3* 7.8*  HCT 24.5* 23.7*  PLT 77* 65*    BMET Recent Labs    12/02/17 0314 12/03/17 0330  NA 145 142  K 5.1 5.3*  CL 111 111  CO2 23 19*  GLUCOSE 146* 118*  BUN 86* 99*  CREATININE 4.62* 4.39*  CALCIUM 8.8* 8.8*    Medications: I have reviewed the patient's current medications.  Assessment/Plan:  1.  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   Hypercalcemia secondary to #1-resolved  3.   Renal failure/hyperkalemia secondary to #1-followed by nephrology  4.   Anemia/thrombocytopenia secondary to multiple myeloma and Velcade  5.   Status post prophylactic IM nailing right femur 11/20/2017  6.    History of polysubstance abuse  7.    Hepatitis C positive  8.     History of hyperuricemia-resolved   Andres Fernandez appears to be tolerating the Velcade/Decadron well.  The lymphocytosis has improved.  He will receive day 11 Velcade on 12/04/2017.  I will add a white cell differential count today and on 12/04/2017.  Please call oncology as needed on 12/04/2017.  I will check on him  12/05/2017.  He continues acyclovir and allopurinol prophylaxis.   LOS: 10 days   Betsy Coder, MD   12/03/2017, 7:43 AM

## 2017-12-04 LAB — CBC WITH DIFFERENTIAL/PLATELET
BASOS ABS: 0 10*3/uL (ref 0.0–0.1)
Basophils Relative: 0 %
EOS ABS: 0 10*3/uL (ref 0.0–0.7)
Eosinophils Relative: 0 %
HEMATOCRIT: 25.1 % — AB (ref 39.0–52.0)
Hemoglobin: 8.3 g/dL — ABNORMAL LOW (ref 13.0–17.0)
LYMPHS ABS: 0.6 10*3/uL — AB (ref 0.7–4.0)
Lymphocytes Relative: 17 %
MCH: 30.1 pg (ref 26.0–34.0)
MCHC: 33.1 g/dL (ref 30.0–36.0)
MCV: 90.9 fL (ref 78.0–100.0)
MONOS PCT: 12 %
Monocytes Absolute: 0.5 10*3/uL (ref 0.1–1.0)
Neutro Abs: 2.7 10*3/uL (ref 1.7–7.7)
Neutrophils Relative %: 71 %
PLATELETS: 56 10*3/uL — AB (ref 150–400)
RBC: 2.76 MIL/uL — AB (ref 4.22–5.81)
RDW: 14.7 % (ref 11.5–15.5)
WBC: 3.8 10*3/uL — AB (ref 4.0–10.5)

## 2017-12-04 MED ORDER — BORTEZOMIB CHEMO SQ INJECTION 3.5 MG (2.5MG/ML)
1.3000 mg/m2 | Freq: Once | INTRAMUSCULAR | Status: AC
  Administered 2017-12-04: 2.25 mg via SUBCUTANEOUS
  Filled 2017-12-04: qty 2.25

## 2017-12-04 MED ORDER — ONDANSETRON HCL 4 MG PO TABS
4.0000 mg | ORAL_TABLET | Freq: Once | ORAL | Status: AC
  Administered 2017-12-04: 4 mg via ORAL
  Filled 2017-12-04: qty 1

## 2017-12-04 NOTE — Progress Notes (Signed)
[facility] Progress Note  Subjective: up in chair, denies SOB, CP, abd pain, n/v/d.   Vitals:   12/04/17 0000 12/04/17 0200 12/04/17 0400 12/04/17 0800  BP: (!) 100/42 125/64 126/64   Pulse: 95 90 72   Resp: (!) 21 13 (!) 21   Temp: 98.4 F (36.9 C)  98.3 F (36.8 C) 98.1 F (36.7 C)  TempSrc: Oral  Oral Oral  SpO2: 91% 92% 96%   Weight:      Height:        Inpatient medications: . acyclovir  200 mg Oral Daily  . allopurinol  100 mg Oral Daily  . bortezomib SQ  1.3 mg/m2 (Treatment Plan Recorded) Subcutaneous Once  . feeding supplement (ENSURE ENLIVE)  237 mL Oral BID BM  . ondansetron  4 mg Oral Once  . polyethylene glycol  17 g Oral Daily   . sodium chloride     acetaminophen, heparin lock flush, heparin lock flush, iopamidol, lip balm, ondansetron (ZOFRAN) IV, oxyCODONE, sodium chloride flush  Exam: Thin AAM, nad No jvd Chest clear bilat RRR no mrg Abd soft ntnd Ext no edema NF, Ox3  CXR 12/23 - improved IS edema compared to 12/20  Impression: 1. 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   Some slow improvement in creatinine.  No TPE due to marginal data showing improvement, and lack of known time course of renal failure in this patient. Started chemoRx (velcade/ decadron) on 12/15, has mild TLS, got rasburicase 12/17. Uric acid/ phos levels are under control now.  Renal function is poor but stable, good UOP , no volume issues.  Will f/u w/ Dr Marval Regal in OP setting, will contact patient w/ f/u office visit information.  Will sign off.  2. MM/ PCL - per ONC 3. HyperCa++ - resolved 4. Met acidosis - resolving 5. Pancytopenia - due to malignancy + chemo  Plan - as above   Kelly Splinter MD [facility] pager [phone]   12/04/2017, 9:01 AM   Recent Labs  Lab 11/29/17 0258  12/01/17 0336 12/02/17 0314 12/03/17 0330  NA 143  143   < > 145 145 142  K 4.2  4.2   < > 4.6 5.1 5.3*  CL 110  111   < >  112* 111 111  CO2 21*  21*   < > 21* 23 19*  GLUCOSE 115*  115*   < > 118* 146* 118*  BUN 82*  85*   < > 77* 86* 99*  CREATININE 5.37*  5.37*   < > 4.59* 4.62* 4.39*  CALCIUM 7.4*  7.5*   < > 8.6* 8.8* 8.8*  PHOS 5.0*  --   --   --   --    < > = values in this interval not displayed.   Recent Labs  Lab 11/30/17 0322 12/01/17 0336 12/02/17 0314  AST 100* 45* 31  ALT 43 37 38  ALKPHOS 71 65 64  BILITOT 0.7 0.7 0.4  PROT 5.9* 6.1* 6.5  ALBUMIN 2.5* 2.5* 2.5*   Recent Labs  Lab 11/29/17 0258  12/01/17 0336 12/02/17 0314 12/03/17 0330 12/04/17 0301  WBC 10.0   < > 8.1 8.3 3.9* 3.8*  NEUTROABS 7.4  --  5.9  --   --  2.7  HGB 8.8*   < > 8.4* 8.3* 7.8* 8.3*  HCT 25.6*   < > 25.8* 24.5* 23.7* 25.1*  MCV 89.8   < > 92.1 91.8 90.8  90.9  PLT 91*   < > 70* 77* 65* 56*   < > = values in this interval not displayed.   Iron/TIBC/Ferritin/ %Sat No results found for: IRON, TIBC, FERRITIN, IRONPCTSAT

## 2017-12-04 NOTE — Progress Notes (Signed)
PROGRESS NOTE    Andres Fernandez  FTD:[id number] DOB: 12/08/1956 DOA: 11/23/2017 PCP: Patient, No Pcp Per   Brief Narrative:  62yom discharged 12/13, workup for suspected multiple myeloma with BM biopsy pending on discharge; 12/14 BM biopsy results available, oncology requested direct admit for aggressive inpt tx. Chemotherapy initiated 12/15. 12/16 decompensated with fever, tachycardia, hypotension; started on abx. Treated for tumor lysis syndrome. Nephrology consulted for worsening creatinine.  No acute events overnight. Has no complaints. Oncology following.   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 - continue management per oncology - Chemotherapy initiated 12/15. Sp Rasburicase 12/17 - Continue allopurinol. - daily uric acid levels  - Velcade per oncology   Acute Hypoxic Resp Failure, resolving: -Wean O2 as tolerated -O2 supplementation to maintain O2 sat 92% or greater  Epistaxis, resolved -self reported after blowing his nose 12/02/17 -bleeding stopped with pressure -Monitor -CBC am  AKI with suspicion for myeloma induced nephropathy  - adequate UOP. K+ normal. - cr 4.39 from 4.62 from 4.59 (3.80 seven days ago) - continue management per nephrology, appreciate recs - avoid nephrotoxic agents/hypotension  SIRS, resolved.  -Resolved -afebrile -CT abd/pelvis with pericholecystic fluid -Lactic back to normal  -CXR right base  atelectasis vs pneumonia but no infiltrate on chest CT - no abd symptoms or RUQ pain. Transaminases likely up from hepatitis C. -RUQ [name] with thickened gallbladder wall and pericholecystic fluid without sonographic murphy or gallstones.  -Partner discussed with surgery on 12/20 who noted may be related to low albumin, but without symptoms - d/c cefepime and follow.    Anemia of malignancy. - s/p 2 units PRBC 12/15, 1 unit 12/17. - Hgb remains stable - follow CBC. Heparin stopped.  Thrombocytopenia - Daily CBC - Suspect secondary to chemotherapy. Management per oncology. - heparin stopped 12/18.  Hepatitis C, new dx. - f/u with GI as an outpt  Elevated transaminases - isolated, CT liver appearance unremarkable. Likely secondary to hepatitis C. Follow clinically. - RUQ [name] as above  Pathologic right femur fx s/p intramedullary nail 12/11 - WBAT per ortho. F/u end of year for suture removal. 6 weeks day ASA 325 for DVT proph per ortho-hold  Essential HTN. -BP stable -holding amlodipine.   Constipation - resolving - continue bowel regimen  Ambulatory dysfunction -continue PT -Fall precaution  DVT prophylaxis: SCD Code Status: full  Family Communication: none at bedside Disposition Plan: will stay another midnight to continue current management   Consultants:   Oncology  nephrology   Procedures  2 units pRBC 12/15, 1 unit pRBC 12/17   Antimicrobials:   Cefepime 12/17 -> 12/19  Vancomycin 12/17 -> 12/18   Acyclovir po 12/14 >>>   Objective: Vitals:   12/03/17 2200 12/04/17 0000 12/04/17 0200 12/04/17 0400  BP: 112/71 (!) 100/42 125/64 126/64  Pulse: 79 95 90 72  Resp: (!) 24 (!) 21 13 (!) 21  Temp:  98.4 F (36.9 C)  98.3 F (36.8 C)  TempSrc:  Oral  Oral  SpO2: 95% 91% 92% 96%  Weight:      Height:        Intake/Output Summary (Last 24 hours) at 12/04/2017 0825 Last data filed at 12/03/2017 1930 Gross per 24 hour  Intake 720 ml  Output  550 ml  Net 170 ml   Filed Weights   11/25/17 0500 11/29/17 0401 12/02/17 0344  Weight: 70.1 kg (154 lb 8.7 oz) 66 kg (145 lb 8.1 oz) 63 kg (138 lb 14.2 oz)    Examination:  General: No acute distress. Cardiovascular: Heart sounds show a regular rate, and rhythm. No gallops or rubs. No murmurs.  Lungs: Clear to auscultation bilaterally with good air movement. No rales, rhonchi or wheezes. Abdomen: Soft, nontender, nondistended with normal active bowel sounds. No masses. No hepatosplenomegaly. Neurological: Alert and oriented 3. Moves all extremities 4 with equal strength. Cranial nerves II through XII grossly intact. Skin: Warm and dry. No rashes or lesions. Extremities: No clubbing or cyanosis. No edema. Trace sacral edema. Psychiatric: Mood and affect are normal. Insight and judgment are appropriate.  Data Reviewed: I have personally reviewed following labs and imaging studies  CBC: Recent Labs  Lab 11/29/17 0258 11/30/17 0322 12/01/17 0336 12/02/17 0314 12/03/17 0330 12/04/17 0301  WBC 10.0 9.5 8.1 8.3 3.9* 3.8*  NEUTROABS 7.4  --  5.9  --   --  2.7  HGB 8.8* 8.5* 8.4* 8.3* 7.8* 8.3*  HCT 25.6* 24.9* 25.8* 24.5* 23.7* 25.1*  MCV 89.8 90.9 92.1 91.8 90.8 90.9  PLT 91* 66* 70* 77* 65* 56*   Basic Metabolic Panel: Recent Labs  Lab 11/29/17 0258 11/30/17 0322 12/01/17 0336 12/02/17 0314 12/03/17 0330  NA 143  143 146* 145 145 142  K 4.2  4.2 4.4 4.6 5.1 5.3*  CL 110  111 115* 112* 111 111  CO2 21*  21* 19* 21* 23 19*  GLUCOSE 115*  115* 111* 118* 146* 118*  BUN 82*  85* 78* 77* 86* 99*  CREATININE 5.37*  5.37* 4.89* 4.59* 4.62* 4.39*  CALCIUM 7.4*  7.5* 8.0* 8.6* 8.8* 8.8*  PHOS 5.0*  --   --   --   --    GFR: Estimated Creatinine Clearance: 15.9 mL/min (A) (by C-G formula based on SCr of 4.39 mg/dL (H)). Liver Function Tests: Recent Labs  Lab 11/28/17 0329 11/29/17 0258 11/30/17 0322 12/01/17 0336 12/02/17 0314  AST 97* 168* 100* 45* 31  ALT  56 52 43 37 38  ALKPHOS 69 84 71 65 64  BILITOT 0.5 0.9 0.7 0.7 0.4  PROT 5.0* 5.4* 5.9* 6.1* 6.5  ALBUMIN 2.3* 2.4*  2.4* 2.5* 2.5* 2.5*   No results for input(s): LIPASE, AMYLASE in the last 168 hours. No results for input(s): AMMONIA in the last 168 hours. Coagulation Profile: Recent Labs  Lab 12/01/17 0724  INR 0.97   Cardiac Enzymes: No results for input(s): CKTOTAL, CKMB, CKMBINDEX, TROPONINI in the last 168 hours. BNP (last 3 results) No results for input(s): PROBNP in the last 8760 hours. HbA1C: No results for input(s): HGBA1C in the last 72 hours. CBG: No results for input(s): GLUCAP in the last 168 hours. Lipid Profile: No results for input(s): CHOL, HDL, LDLCALC, TRIG, CHOLHDL, LDLDIRECT in the last 72 hours. Thyroid Function Tests: No results for input(s): TSH, T4TOTAL, FREET4, T3FREE, THYROIDAB in the last 72 hours. Anemia Panel: No results for input(s): VITAMINB12, FOLATE, FERRITIN, TIBC, IRON, RETICCTPCT in the last 72 hours. Sepsis Labs:  No results for input(s): PROCALCITON, LATICACIDVEN in the last 168 hours.  Recent Results (from the past 240 hour(s))  Culture, blood (Routine X 2) w Reflex to ID Panel     Status: None   Collection Time: 11/25/17  8:13 AM  Result Value Ref Range Status   Specimen Description BLOOD LEFT ANTECUBITAL  Final   Special Requests   Final    BOTTLES DRAWN AEROBIC AND ANAEROBIC Blood Culture adequate volume   Culture   Final    NO GROWTH 5 DAYS Performed at [facility], [address], [place], [place] [address]    Report Status 11/30/2017 FINAL  Final  Culture, blood (Routine X 2) w Reflex to ID Panel     Status: None   Collection Time: 11/25/17  8:18 AM  Result Value Ref Range Status   Specimen Description BLOOD BLOOD LEFT FOREARM  Final   Special Requests   Final    BOTTLES DRAWN AEROBIC AND ANAEROBIC Blood Culture adequate volume   Culture   Final    NO GROWTH 5 DAYS Performed at [facility], [place]  [address], [place], [place] [address]    Report Status 11/30/2017 FINAL  Final  MRSA PCR Screening     Status: None   Collection Time: 11/25/17  9:41 AM  Result Value Ref Range Status   MRSA by PCR NEGATIVE NEGATIVE Final    Comment:        The GeneXpert MRSA Assay (FDA approved for NASAL specimens only), is one component of a comprehensive MRSA colonization surveillance program. It is not intended to diagnose MRSA infection nor to guide or monitor treatment for MRSA infections.   Urine Culture     Status: Abnormal   Collection Time: 11/25/17  4:05 PM  Result Value Ref Range Status   Specimen Description URINE, RANDOM  Final   Special Requests NONE  Final   Culture (A)  Final    <10,000 COLONIES/mL INSIGNIFICANT GROWTH Performed at [name], [address], [place], [place] [address]    Report Status 11/26/2017 FINAL  Final         Radiology Studies: No results found.      Scheduled Meds: . acyclovir  200 mg Oral Daily  . allopurinol  100 mg Oral Daily  . feeding supplement (ENSURE ENLIVE)  237 mL Oral BID BM  . polyethylene glycol  17 g Oral Daily   Continuous Infusions: . sodium chloride       LOS: 11 days    Time spent: over 20 min    Kayleen Memos, MD [facility] Pager [phone]  If 7PM-7AM, please contact night-coverage [email or web address] Password [email or web address] 12/04/2017, 8:25 AM

## 2017-12-04 NOTE — Plan of Care (Signed)
  Medication: Risk for medication side effects will decrease 12/04/2017 1427 - Progressing by Dorene Sorrow, RN   Pain Management: Pain level will decrease 12/04/2017 1427 - Progressing by Dorene Sorrow, RN   Education: Knowledge of disease or condition will improve 12/04/2017 1427 - Progressing by Dorene Sorrow, RN

## 2017-12-05 ENCOUNTER — Telehealth: Payer: Self-pay | Admitting: Hematology

## 2017-12-05 DIAGNOSIS — N19 Unspecified kidney failure: Secondary | ICD-10-CM

## 2017-12-05 LAB — COMPREHENSIVE METABOLIC PANEL
ALBUMIN: 2.7 g/dL — AB (ref 3.5–5.0)
ALK PHOS: 72 U/L (ref 38–126)
ALT: 35 U/L (ref 17–63)
AST: 23 U/L (ref 15–41)
Anion gap: 11 (ref 5–15)
BUN: 101 mg/dL — AB (ref 6–20)
CALCIUM: 8.9 mg/dL (ref 8.9–10.3)
CHLORIDE: 116 mmol/L — AB (ref 101–111)
CO2: 17 mmol/L — AB (ref 22–32)
CREATININE: 4.61 mg/dL — AB (ref 0.61–1.24)
GFR calc Af Amer: 15 mL/min — ABNORMAL LOW (ref >60)
GFR calc non Af Amer: 13 mL/min — ABNORMAL LOW (ref >60)
GLUCOSE: 96 mg/dL (ref 65–99)
Potassium: 5.2 mmol/L — ABNORMAL HIGH (ref 3.5–5.1)
SODIUM: 144 mmol/L (ref 135–145)
Total Bilirubin: 0.8 mg/dL (ref 0.3–1.2)
Total Protein: 6.4 g/dL — ABNORMAL LOW (ref 6.5–8.1)

## 2017-12-05 LAB — CBC
HCT: 24.7 % — ABNORMAL LOW (ref 39.0–52.0)
HCT: 24.5 % — ABNORMAL LOW (ref 39.0–52.0)
HEMOGLOBIN: 8.2 g/dL — AB (ref 13.0–17.0)
Hemoglobin: 8.2 g/dL — ABNORMAL LOW (ref 13.0–17.0)
MCH: 30.3 pg (ref 26.0–34.0)
MCH: 30.6 pg (ref 26.0–34.0)
MCHC: 33.2 g/dL (ref 30.0–36.0)
MCHC: 33.5 g/dL (ref 30.0–36.0)
MCV: 91.1 fL (ref 78.0–100.0)
MCV: 91.4 fL (ref 78.0–100.0)
PLATELETS: 45 10*3/uL — AB (ref 150–400)
PLATELETS: 44 10*3/uL — AB (ref 150–400)
RBC: 2.71 MIL/uL — AB (ref 4.22–5.81)
RBC: 2.68 MIL/uL — ABNORMAL LOW (ref 4.22–5.81)
RDW: 15 % (ref 11.5–15.5)
RDW: 14.8 % (ref 11.5–15.5)
WBC: 4.7 10*3/uL (ref 4.0–10.5)
WBC: 3.6 10*3/uL — ABNORMAL LOW (ref 4.0–10.5)

## 2017-12-05 LAB — BASIC METABOLIC PANEL
Anion gap: 10 (ref 5–15)
BUN: 97 mg/dL — AB (ref 6–20)
CO2: 18 mmol/L — ABNORMAL LOW (ref 22–32)
CREATININE: 4.74 mg/dL — AB (ref 0.61–1.24)
Calcium: 9.2 mg/dL (ref 8.9–10.3)
Chloride: 116 mmol/L — ABNORMAL HIGH (ref 101–111)
GFR calc Af Amer: 14 mL/min — ABNORMAL LOW (ref >60)
GFR, EST NON AFRICAN AMERICAN: 12 mL/min — AB (ref >60)
GLUCOSE: 97 mg/dL (ref 65–99)
Potassium: 5.2 mmol/L — ABNORMAL HIGH (ref 3.5–5.1)
SODIUM: 144 mmol/L (ref 135–145)

## 2017-12-05 MED ORDER — SODIUM POLYSTYRENE SULFONATE 15 GM/60ML PO SUSP
15.0000 g | Freq: Once | ORAL | Status: AC
  Administered 2017-12-05: 15 g via ORAL
  Filled 2017-12-05: qty 60

## 2017-12-05 NOTE — Progress Notes (Signed)
OT Cancellation Note  Patient Details Name: Andres Fernandez MRN: [id number] DOB: December 31, 1955   Cancelled Treatment:    Reason Eval/Treat Not Completed: Fatigue/lethargy limiting ability to participate  [place] 12/05/2017, 9:52 AM  Lesle Chris, OTR/L [phone] 12/05/2017

## 2017-12-05 NOTE — Telephone Encounter (Signed)
Spoke with patient re 12/14/17 hosp f/u with Dr. Irene Limbo at 2:20pm. Patient given date/time/location. Date per Dr. Irene Limbo.

## 2017-12-05 NOTE — Progress Notes (Signed)
IP PROGRESS NOTE  Subjective:   Mr. Kindt completed another treatment with Velcade yesterday.  He reports no nausea yesterday.  He has nausea this morning.  No diarrhea.  No pain.  No dyspnea. Objective: Vital signs in last 24 hours: Blood pressure (!) 108/49, pulse 88, temperature 98.3 F (36.8 C), temperature source Oral, resp. rate 19, height 5' 8"  (1.727 m), weight 138 lb 14.2 oz (63 kg), SpO2 93 %.  Intake/Output from previous day: 12/25 0701 - 12/26 0700 In: -  Out: 1900 [Urine:1900]  Physical Exam:  HEENT: No thrush Abdomen: Nontender, no hepatosplenomegaly Extremities: No leg edema   Lab Results: Recent Labs    12/04/17 0301 12/05/17 0306  WBC 3.8* 4.7  HGB 8.3* 8.2*  HCT 25.1* 24.7*  PLT 56* 45*    BMET Recent Labs    12/03/17 0330 12/05/17 0306  NA 142 144  K 5.3* 5.2*  CL 111 116*  CO2 19* 17*  GLUCOSE 118* 96  BUN 99* 101*  CREATININE 4.39* 4.61*  CALCIUM 8.8* 8.9    Medications: I have reviewed the patient's current medications.  Assessment/Plan:  1.  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   Hypercalcemia secondary to #1-resolved  3.   Renal failure/hyperkalemia secondary to #1-followed by nephrology  4.   Anemia/thrombocytopenia secondary to multiple myeloma and Velcade  5.   Status post prophylactic IM nailing right femur 11/20/2017  6.    History of polysubstance abuse  7.    Hepatitis C positive  8.     History of hyperuricemia-resolved   Mr. Bottino appears stable.  He has completed cycle 1 Velcade/Decadron.  Outpatient follow-up will be scheduled at the Cancer center for the week of 12/10/2017.  He is stable from a hematologic standpoint.  No indication for transfusion at present.  The platelet count may fall over the next week.  Recommendations: 1.  Management of renal failure per nephrology 2.  Discharge plans per the medicine  service 3.  Check CBC/differential 12/07/2017 4.  Please call Oncology as needed, we will check on him if he remains in the hospital.  Outpatient follow-up will be scheduled with Dr. Irene Limbo.   LOS: 12 days   Betsy Coder, MD   12/05/2017, 8:26 AM

## 2017-12-05 NOTE — Plan of Care (Signed)
  Safety: Ability to remain free from injury will improve 12/05/2017 1758 - Progressing by Dorene Sorrow, RN   Pain Management: Pain level will decrease 12/05/2017 1758 - Progressing by Dorene Sorrow, RN   Skin Integrity: Status of oral mucous membranes will improve 12/05/2017 1758 - Progressing by Dorene Sorrow, RN

## 2017-12-05 NOTE — Progress Notes (Signed)
PT Cancellation Note  Patient Details Name: Andres Fernandez MRN: [id number] DOB: [birth year]   Cancelled Treatment:    Reason Eval/Treat Not Completed: Medical issues which prohibited therapy   [name] 12/05/2017, 11:14 AM  [name] PT [phone]

## 2017-12-05 NOTE — Progress Notes (Signed)
PROGRESS NOTE    Andres Fernandez  IEP:[id number] DOB: 1956/05/03 DOA: 11/23/2017 PCP: Patient, No Pcp Per   Brief Narrative:  73yom discharged 12/13, workup for suspected multiple myeloma with BM biopsy pending on discharge; 12/14 BM biopsy results available, oncology requested direct admit for aggressive inpt tx. Chemotherapy initiated 12/15. 12/16 decompensated with fever, tachycardia, hypotension; started on abx. Treated for tumor lysis syndrome. Nephrology consulted for worsening creatinine.  No acute events overnight. Patient seen and examined at his bedside. Has no complaints. States he drinks a lot of fluid with good urine output.  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 - continue management per oncology - Chemotherapy initiated 12/15. Sp Rasburicase 12/17 - Continue allopurinol. - daily uric acid levels  - Velcade per oncology   Acute Hypoxic Resp Failure, resolving: -Wean O2 as tolerated -O2 supplementation to maintain O2 sat 92% or greater  Epistaxis, resolved -self reported after blowing his nose 12/02/17 -bleeding stopped with pressure -Monitor -CBC am  AKI with suspicion for myeloma induced nephropathy  - adequate UOP.  - cr 4.61 from 4.39 from 4.62 from 4.59 (3.80 seven days ago) - continue management per nephrology, appreciate recs - avoid nephrotoxic agents/hypotension/dehydration  Hyperkalemia in the setting of renal  insufficiency -K+ 5.2 -Kayexalate ordered -BMP am  SIRS, resolved.  -Resolved -afebrile -CT abd/pelvis with pericholecystic fluid -Lactic back to normal  -CXR right base atelectasis vs pneumonia but no infiltrate on chest CT -no abd symptoms or RUQ pain. Transaminases likely up from hepatitis C. -RUQ [name] with thickened gallbladder wall and pericholecystic fluid without sonographic murphy or gallstones.  -Partner discussed with surgery on 12/20 who noted may be related to low albumin, but without symptoms -d/c cefepime and follow.   -currently not on antibiotics  Anemia of malignancy. - s/p 2 units PRBC 12/15, 1 unit 12/17. - Hgb remains stable - follow CBC. Heparin stopped.  Thrombocytopenia - Daily CBC - Suspect secondary to chemotherapy. Management per oncology. - heparin stopped 12/18. - plt 45 from 56 (12/04/17) -No sign of overt bleeding  Hepatitis C, new dx. - f/u with GI as an outpt  Elevated transaminases, resolved - isolated, CT liver appearance unremarkable. Likely secondary to hepatitis C. Follow clinically. - RUQ [name] as above  Pathologic right femur fx s/p intramedullary nail 12/11 - WBAT per ortho. F/u end of year for suture removal. 6 weeks day ASA 325 for DVT proph per ortho-hold  Essential HTN. -BP stable -holding amlodipine.   Constipation - resolving - continue bowel regimen  Ambulatory dysfunction -continue PT -Fall precaution  Moderate protein calorie malnutrition -albumin 2.7 -continue ensure enlive -patient encouraged to increase po calorie intake  DVT prophylaxis: SCDs Code Status: full  Family Communication: none at bedside Disposition Plan: will stay another midnight to continue current management   Consultants:   Oncology  nephrology   Procedures  2 units pRBC 12/15, 1 unit pRBC 12/17   Antimicrobials:   Cefepime 12/17 ->  12/19  Vancomycin 12/17 -> 12/18   Acyclovir po 12/14 >>>   Objective: Vitals:    12/05/17 0200 12/05/17 0300 12/05/17 0400 12/05/17 0411  BP: (!) 112/51  (!) 108/49   Pulse: 78 77 88   Resp: 17 16 19    Temp:    98.3 F (36.8 C)  TempSrc:    Oral  SpO2: 93% 95% 93%   Weight:      Height:        Intake/Output Summary (Last 24 hours) at 12/05/2017 0743 Last data filed at 12/05/2017 0400 Gross per 24 hour  Intake -  Output 1900 ml  Net -1900 ml   Filed Weights   11/25/17 0500 11/29/17 0401 12/02/17 0344  Weight: 70.1 kg (154 lb 8.7 oz) 66 kg (145 lb 8.1 oz) 63 kg (138 lb 14.2 oz)    Examination:  General: No acute distress. Cardiovascular: Heart sounds show a regular rate, and rhythm. No gallops or rubs. No murmurs.  Lungs: Clear to auscultation bilaterally with good air movement. No rales, rhonchi or wheezes. Abdomen: Soft, nontender, nondistended with normal active bowel sounds. No masses. No hepatosplenomegaly. Neurological: Alert and oriented 3. Moves all extremities 4 with equal strength. Cranial nerves II through XII grossly intact. Skin: Warm and dry. No rashes or lesions. Extremities: No clubbing or cyanosis. No edema. Trace sacral edema. Psychiatric: Mood and affect are normal. Insight and judgment are appropriate.  Data Reviewed: I have personally reviewed following labs and imaging studies  CBC: Recent Labs  Lab 11/29/17 0258  12/01/17 0336 12/02/17 0314 12/03/17 0330 12/04/17 0301 12/05/17 0306  WBC 10.0   < > 8.1 8.3 3.9* 3.8* 4.7  NEUTROABS 7.4  --  5.9  --   --  2.7  --   HGB 8.8*   < > 8.4* 8.3* 7.8* 8.3* 8.2*  HCT 25.6*   < > 25.8* 24.5* 23.7* 25.1* 24.7*  MCV 89.8   < > 92.1 91.8 90.8 90.9 91.1  PLT 91*   < > 70* 77* 65* 56* 45*   < > = values in this interval not displayed.   Basic Metabolic Panel: Recent Labs  Lab 11/29/17 0258 11/30/17 0322 12/01/17 0336 12/02/17 0314 12/03/17 0330 12/05/17 0306  NA 143  143 146* 145 145 142 144  K 4.2  4.2 4.4 4.6 5.1 5.3* 5.2*  CL 110  111 115* 112* 111 111 116*  CO2 21*   21* 19* 21* 23 19* 17*  GLUCOSE 115*  115* 111* 118* 146* 118* 96  BUN 82*  85* 78* 77* 86* 99* 101*  CREATININE 5.37*  5.37* 4.89* 4.59* 4.62* 4.39* 4.61*  CALCIUM 7.4*  7.5* 8.0* 8.6* 8.8* 8.8* 8.9  PHOS 5.0*  --   --   --   --   --    GFR: Estimated Creatinine Clearance: 15.2 mL/min (A) (by C-G formula based on SCr of 4.61 mg/dL (H)). Liver Function Tests: Recent Labs  Lab 11/29/17 0258 11/30/17 0322 12/01/17 0336 12/02/17 0314 12/05/17 0306  AST 168* 100* 45* 31 23  ALT 52 43 37 38 35  ALKPHOS 84 71 65 64 72  BILITOT 0.9 0.7 0.7 0.4 0.8  PROT 5.4* 5.9* 6.1* 6.5 6.4*  ALBUMIN 2.4*  2.4* 2.5* 2.5* 2.5* 2.7*   No results for input(s): LIPASE, AMYLASE in the last 168 hours. No results for input(s): AMMONIA in the last 168 hours. Coagulation Profile: Recent Labs  Lab 12/01/17 0724  INR 0.97   Cardiac  Enzymes: No results for input(s): CKTOTAL, CKMB, CKMBINDEX, TROPONINI in the last 168 hours. BNP (last 3 results) No results for input(s): PROBNP in the last 8760 hours. HbA1C: No results for input(s): HGBA1C in the last 72 hours. CBG: No results for input(s): GLUCAP in the last 168 hours. Lipid Profile: No results for input(s): CHOL, HDL, LDLCALC, TRIG, CHOLHDL, LDLDIRECT in the last 72 hours. Thyroid Function Tests: No results for input(s): TSH, T4TOTAL, FREET4, T3FREE, THYROIDAB in the last 72 hours. Anemia Panel: No results for input(s): VITAMINB12, FOLATE, FERRITIN, TIBC, IRON, RETICCTPCT in the last 72 hours. Sepsis Labs: No results for input(s): PROCALCITON, LATICACIDVEN in the last 168 hours.  Recent Results (from the past 240 hour(s))  Culture, blood (Routine X 2) w Reflex to ID Panel     Status: None   Collection Time: 11/25/17  8:13 AM  Result Value Ref Range Status   Specimen Description BLOOD LEFT ANTECUBITAL  Final   Special Requests   Final    BOTTLES DRAWN AEROBIC AND ANAEROBIC Blood Culture adequate volume   Culture   Final    NO GROWTH 5  DAYS Performed at [facility], [address], [place], [place] [address]    Report Status 11/30/2017 FINAL  Final  Culture, blood (Routine X 2) w Reflex to ID Panel     Status: None   Collection Time: 11/25/17  8:18 AM  Result Value Ref Range Status   Specimen Description BLOOD BLOOD LEFT FOREARM  Final   Special Requests   Final    BOTTLES DRAWN AEROBIC AND ANAEROBIC Blood Culture adequate volume   Culture   Final    NO GROWTH 5 DAYS Performed at [facility], [place] [address], [place], [place] [address]    Report Status 11/30/2017 FINAL  Final  MRSA PCR Screening     Status: None   Collection Time: 11/25/17  9:41 AM  Result Value Ref Range Status   MRSA by PCR NEGATIVE NEGATIVE Final    Comment:        The GeneXpert MRSA Assay (FDA approved for NASAL specimens only), is one component of a comprehensive MRSA colonization surveillance program. It is not intended to diagnose MRSA infection nor to guide or monitor treatment for MRSA infections.   Urine Culture     Status: Abnormal   Collection Time: 11/25/17  4:05 PM  Result Value Ref Range Status   Specimen Description URINE, RANDOM  Final   Special Requests NONE  Final   Culture (A)  Final    <10,000 COLONIES/mL INSIGNIFICANT GROWTH Performed at [facility], [address] [place],  [address]    Report Status 11/26/2017 FINAL  Final         Radiology Studies: No results found.      Scheduled Meds: . acyclovir  200 mg Oral Daily  . allopurinol  100 mg Oral Daily  . feeding supplement (ENSURE ENLIVE)  237 mL Oral BID BM  . polyethylene glycol  17 g Oral Daily   Continuous Infusions: . sodium chloride       LOS: 12 days    Time spent: over 20 min    Kayleen Memos, MD [facility] Pager [phone]  If 7PM-7AM, please contact night-coverage [email or web address] Password [facility] 12/05/2017, 7:43 AM

## 2017-12-06 LAB — BASIC METABOLIC PANEL
Anion gap: 12 (ref 5–15)
BUN: 97 mg/dL — AB (ref 6–20)
CHLORIDE: 115 mmol/L — AB (ref 101–111)
CO2: 18 mmol/L — ABNORMAL LOW (ref 22–32)
CREATININE: 4.83 mg/dL — AB (ref 0.61–1.24)
Calcium: 9.2 mg/dL (ref 8.9–10.3)
GFR calc Af Amer: 14 mL/min — ABNORMAL LOW (ref >60)
GFR calc non Af Amer: 12 mL/min — ABNORMAL LOW (ref >60)
GLUCOSE: 103 mg/dL — AB (ref 65–99)
POTASSIUM: 5.4 mmol/L — AB (ref 3.5–5.1)
SODIUM: 145 mmol/L (ref 135–145)

## 2017-12-06 MED ORDER — SODIUM POLYSTYRENE SULFONATE 15 GM/60ML PO SUSP: 30.0000 g | Freq: Once | ORAL | Status: DC

## 2017-12-06 MED ORDER — SENNA 8.6 MG PO TABS
2.0000 | ORAL_TABLET | Freq: Every day | ORAL | Status: DC
  Administered 2017-12-06: 17.2 mg via ORAL
  Filled 2017-12-06: qty 2

## 2017-12-06 MED ORDER — SODIUM POLYSTYRENE SULFONATE PO POWD
30.0000 g | Freq: Once | ORAL | Status: AC
  Administered 2017-12-06: 30 g via ORAL
  Filled 2017-12-06: qty 30

## 2017-12-06 NOTE — Discharge Summary (Addendum)
Discharge Summary  Andres Fernandez TRR:[id number] DOB: 1956-03-23  PCP: Patient, No Pcp Per  Admit date: 11/23/2017 Discharge date: 12/06/2017  Time spent: 25 minutes   Recommendations for Outpatient Follow-up:  1. Please follow up with oncology 12/10/17 2. Please follow up with your PCP within a week 3. Please abstain from tobaco 4. Avoid dehydration 5. Take your medications as prescribed  UPDATE: THE WRITER CALLED THE PATIENT'S SISTER WHO WOULD LIKE TO KNOW HOW HE WILL BE FOLLOWED IN THE OUTPATIENT SETTING. HOME HEALTH SERVICES FOR MEDICATION ASSISTANCE ORDERED. STATES SHE HAS BEEN TALKING WITH THE SOCIAL WORKER FOR OUTPATIENT CARE PLANNING.   Discharge Diagnoses:  Active Hospital Problems   Diagnosis Date Noted  . 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 Malnutrition of moderate degree 12/02/2017  . AKI (acute kidney injury) ([place]) 11/27/2017  . SIRS (systemic inflammatory response syndrome) ([place]) 11/27/2017  . Anemia 11/27/2017  . Thrombocytopenia ([place]) 11/27/2017  . Hepatitis C 11/27/2017  . Cancer associated pain   . Tumor lysis syndrome   . Encounter for antineoplastic chemotherapy   . Pathological fracture in neoplastic disease, right femur, initial encounter for fracture ([place]) 11/23/2017  . Acute renal failure ([place]) 11/17/2017  . Tobacco abuse 11/17/2017    Resolved Hospital Problems   Diagnosis Date Noted Date Resolved  . Fever, unspecified  11/27/2017  . Symptomatic anemia 11/17/2017 11/27/2017    Discharge Condition: stable  Diet recommendation: resume previous diet   Vitals:   12/06/17 0520 12/06/17 1234  BP: 103/61 (!) 104/56  Pulse: 67 69  Resp: 16 16  Temp: 98.6 F (37 C) 97.9 F (36.6 C)  SpO2: 96% 100%    History of present illness:  61yom discharged 12/13, workup for suspected multiple myeloma with BM biopsy pending on discharge; 12/14 BM biopsy results available, oncology requested direct admit for aggressive inpt tx. Chemotherapy  initiated 12/15. 12/16 decompensated with fever, tachycardia, hypotension; started on abx. Treated for tumor lysis syndrome. Nephrology consulted for worsening creatinine and followed. Good urine output. Tolerated his treatments well.  On the day of discharge the patient was hemodynamically stable. Had normal bowel movement, eating and ambulating without difficulty. He will need to continue follow up with his oncologist and PCP.  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 -continuemanagement per oncology - Chemotherapy initiated 12/15. Sp Rasburicase 12/17, 12/04/17 - Continue allopurinol. 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 -adequateUOP.  - cr 4.61 from 4.39 from 4.62 from 4.59 (3.80 seven days ago) -continue management per nephrology, appreciate recs - avoid nephrotoxic agents/hypotension/dehydration  Hyperkalemia in the setting of renal insufficiency -K+ 5.2 -Kayexalate given/had bowel movement  SIRS, resolved. -Resolved -  afebrile -CT abd/pelvis with pericholecystic fluid -Lactic back to normal    -CXR right base atelectasis vs pneumonia but no infiltrate on chest CT -no abd symptoms or RUQ pain. Transaminases likely up from hepatitis C. -RUQ [name] with thickened gallbladder wall and pericholecystic fluid without sonographic murphy or gallstones.  -Partner discussed with surgery on 12/20 who noted may be related to low albumin, but without symptoms -d/ccefepime -not on antibiotics  Anemia of malignancy. - s/p 2 units PRBC 12/15, 1 unit 12/17. - Hgbremains stable - follow CBC.Heparin stopped.  Thrombocytopenia -Daily CBC -Suspect secondaryto chemotherapy.Management per oncology. -heparin stopped 12/18. - plt 44 from 56 (12/04/17) -No sign of overt bleeding  Hepatitis C, new dx. - f/u with GI as an outpt  Elevated transaminases, resolved - isolated, CT liver appearance unremarkable. Likely secondary to hepatitis C. Follow clinically. - RUQ [name] as above  Pathologic right femur fx s/p intramedullary nail 12/11 - WBAT per ortho. F/u end of year for suture removal. 6 weeks day ASA 325 for DVT proph per ortho-hold  Essential HTN. -BP stable -holdingamlodipine.   COPD/Centrilobular emphysema -no acute issues  Constipation -resolving - continue bowel regimen  Ambulatory dysfunction -continue PT -Fall precaution  Moderate protein calorie malnutrition -albumin 2.7 -continue ensure enlive -patient encouraged to increase po calorie intake  DVT prophylaxis: SCDs Code Status: full   Consultants:   Oncology  nephrology   Procedures  2 units pRBC 12/15, 1 unit pRBC 12/17   Antimicrobials:   Cefepime 12/17 -> 12/19  Vancomycin 12/17 -> 12/18   Acyclovir po 12/14 >>>   Discharge Exam: BP (!) 104/56 (BP Location: Left Arm)   Pulse 69   Temp 97.9 F (36.6 C) (Oral)   Resp 16   Ht 5' 8"  (1.727 m)   Wt 63 kg (138 lb 14.2 oz)   SpO2 100%   BMI 21.12 kg/m   General: No acute distress. Cardiovascular: Heart sounds show a regular  rate, and rhythm. No gallops or rubs. No murmurs.  Lungs: Clear to auscultation bilaterally with good air movement. No rales, rhonchi or wheezes. Abdomen: Soft, nontender, nondistended with normal active bowel sounds. No masses. No hepatosplenomegaly. Neurological: Alert and oriented 3. Moves all extremities 4 with equal strength. Cranial nerves II through XII grossly intact. Skin: Warm and dry. No rashes or lesions. Extremities: No clubbing or cyanosis. No edema. Trace sacral edema. Psychiatric: Mood and affect are normal. Insight and judgment are appropriate.  Discharge Instructions You were cared for by a hospitalist during your hospital stay. If you have any questions about your discharge medications or the care you received while you were in the hospital after you are discharged, you can call the unit and asked to speak with the hospitalist on call if the hospitalist that took care of you is not available. Once you are discharged, your primary care physician will handle any further medical issues. Please note that NO REFILLS for any discharge medications will be authorized once you are discharged, as it is imperative that you return to your primary care physician (or establish a relationship with a primary care physician if you do not have one) for your aftercare needs so that they can reassess your need for medications and monitor your lab values.  Discharge Instructions    Care order/instruction   Complete by:  As directed    Transfuse Parameters   Care order/instruction   Complete by:  As directed    Transfuse Parameters   Complete patient signature  process for consent form   Complete by:  As directed    Complete patient signature process for consent form   Complete by:  As directed    Practitioner attestation of consent   Complete by:  As directed    I, the ordering practitioner, attest that I have discussed with the patient the benefits, risks, side effects, alternatives,  likelihood of achieving goals and potential problems during recovery for the procedure listed.   Procedure:  Blood Product(s)   Practitioner attestation of consent   Complete by:  As directed    I, the ordering practitioner, attest that I have discussed with the patient the benefits, risks, side effects, alternatives, likelihood of achieving goals and potential problems during recovery for the procedure listed.   Procedure:  Blood Product(s)   SCHEDULING COMMUNICATION   Complete by:  As directed    Chemotherapy Appointment  - 1.5 hour    [facility] NURSING COMMUNICATION   Complete by:  As directed    May order dexamethasone 40 mg po to be given in infusion if patient did not take dose at home.   TREATMENT CONDITIONS   Complete by:  As directed    Patient should have CBC & CMP within 7 days prior to chemotherapy administration. NOTIFY MD IF: ANC < 1500, Hemoglobin < 8, PLT < 100,000,  Total Bili > 1.5, Creatinine > 1.5, ALT & AST > 80 or if patient has unstable vital signs: Temperature > 38.5, SBP > 180 or < 90, RR > 30 or HR > 100.     Allergies as of 12/06/2017   No Known Allergies     Medication List    TAKE these medications   amLODipine 2.5 MG tablet Commonly known as:  NORVASC Take 1 tablet (2.5 mg total) by mouth daily.   cholecalciferol 400 units Tabs tablet Commonly known as:  VITAMIN D Take 400 Units by mouth daily.   oxyCODONE 5 MG immediate release tablet Commonly known as:  Oxy IR/ROXICODONE Take 1 tablet (5 mg total) by mouth every 6 (six) hours as needed for up to 14 days for moderate pain.      No Known Allergies Follow-up Information    Ladell Pier, MD Follow up.   Specialty:  Oncology Contact information: [place] [id number] [phone]            The results of significant diagnostics from this hospitalization (including imaging, microbiology, ancillary and laboratory) are listed below for reference.     Significant Diagnostic Studies: Ct Abdomen Pelvis Wo Contrast  Result Date: 11/25/2017 CLINICAL DATA:  Fever of unknown origin EXAM: CT CHEST, ABDOMEN AND PELVIS WITHOUT CONTRAST TECHNIQUE: Multidetector CT imaging of the chest, abdomen and pelvis was performed following the standard protocol without IV contrast. COMPARISON:  Left lower extremity CT performed 11/17/2017. FINDINGS: CT CHEST FINDINGS Cardiovascular: Heart is borderline enlarged. Aorta is normal caliber with scattered aortic calcifications. Mediastinum/Nodes: No mediastinal, hilar, or axillary adenopathy. Lungs/Pleura: Severe centrilobular and paraseptal emphysema. Trace left pleural effusion. No confluent airspace opacities. Musculoskeletal: No acute bony abnormality. CT ABDOMEN PELVIS FINDINGS Hepatobiliary: Pericholecystic fluid present. No visible focal hepatic abnormality. Pancreas: No focal abnormality or ductal dilatation. Spleen: No focal abnormality. Normal size. Small calcification in the superior spleen. Adrenals/Urinary Tract: No adrenal abnormality. No focal renal abnormality. No stones or hydronephrosis. Urinary bladder is unremarkable. Stomach/Bowel: Stomach, large and small bowel grossly unremarkable. Moderate stool burden in the colon. Vascular/Lymphatic: No evidence of aneurysm or  adenopathy. Aortic and iliac calcifications. Reproductive: No visible focal abnormality. Other: No free fluid or free air. Musculoskeletal: Stranding noted within the subcutaneous soft tissues overlying the right lateral buttocks. There is heterogeneous material within the right gluteus muscles. These findings are likely related to recent hip surgery. Lytic destructive process noted within the left iliac bone on image 86 measuring up to 2.6 cm, unchanged since prior left lower extremity CT. IMPRESSION: Pericholecystic fluid noted. If there is clinical concern for cholecystitis, this could be further evaluated with ultrasound. Lytic destructive lesion  within the left lateral iliac bone measuring up to 2.6 cm. It is most likely reflects an area of multiple myeloma or metastasis. Intramuscular hematoma within the right gluteus muscles with subcutaneous stranding, compatible with recent surgery. Electronically Signed   By: Rolm Baptise M.D.   On: 11/25/2017 14:28   Dg Chest 2 View  Result Date: 11/17/2017 CLINICAL DATA:  61 year old male with chest pain. EXAM: CHEST  2 VIEW COMPARISON:  Chest radiograph dated 05/10/2015 FINDINGS: Mild right lung base atelectatic changes/ scarring. There is no focal consolidation, pleural effusion, or pneumothorax. The cardiac silhouette is within normal limits. There is mild degenerative changes of the spine. No acute osseous pathology. IMPRESSION: No active cardiopulmonary disease. Electronically Signed   By: Anner Crete M.D.   On: 11/17/2017 07:08   Ct Head Wo Contrast  Result Date: 11/17/2017 CLINICAL DATA:  61 year old male with numbness and tingling. Paraplegia. EXAM: CT HEAD WITHOUT CONTRAST TECHNIQUE: Contiguous axial images were obtained from the base of the skull through the vertex without intravenous contrast. COMPARISON:  Facial bone CT dated 03/27/2010 FINDINGS: Brain: The ventricles and sulci appropriate size for patient's age. Minimal periventricular and deep white matter chronic microvascular ischemic changes noted. There is no acute intracranial hemorrhage. No mass effect or midline shift noted. No extra-axial fluid collection. Vascular: There is slight high attenuation of the right MCA in the sylvian fissure (series 7 image 12). This may be chronic. If there is clinical concern for right MCA territory ischemia further evaluation with CT angiography of the head or MRI is recommended. Skull: There are innumerable lucent calvarial lesions concerning for multiple myeloma or metastatic disease. Correlation with history of known malignancy recommended. No acute fracture. Sinuses/Orbits: There is complete  opacification of the visualized right maxillary sinus. Mild mucoperiosteal thickening of paranasal sinuses. There is a lytic lesion involving the posterior right mastoid versus effusion. Other: None IMPRESSION: 1. No acute intracranial hemorrhage. 2. Slightly high attenuation of the right MCA in the right sylvian fissure which may be chronic. Further evaluation with CTA or MRI is recommended if there is high clinical concern for acute right MCA territory ischemia. 3. 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 Correlation with history of malignancy recommended. Electronically Signed   By: Anner Crete M.D.   On: 11/17/2017 06:05   Ct Chest Wo Contrast  Result Date: 11/25/2017 CLINICAL DATA:  Fever of unknown origin EXAM: CT CHEST, ABDOMEN AND PELVIS WITHOUT CONTRAST TECHNIQUE: Multidetector CT imaging of the chest, abdomen and pelvis was performed following the standard protocol without IV contrast. COMPARISON:  Left lower extremity CT performed 11/17/2017. FINDINGS: CT CHEST FINDINGS Cardiovascular: Heart is borderline enlarged. Aorta is normal caliber with scattered aortic calcifications. Mediastinum/Nodes: No mediastinal, hilar, or axillary adenopathy. Lungs/Pleura: Severe centrilobular and paraseptal emphysema. Trace left pleural effusion. No confluent airspace opacities. Musculoskeletal: No acute bony abnormality. CT ABDOMEN PELVIS FINDINGS Hepatobiliary: Pericholecystic fluid present. No visible focal hepatic abnormality. Pancreas: No focal  abnormality or ductal dilatation. Spleen: No focal abnormality. Normal size. Small calcification in the superior spleen. Adrenals/Urinary Tract: No adrenal abnormality. No focal renal abnormality. No stones or hydronephrosis. Urinary bladder is unremarkable. Stomach/Bowel: Stomach, large and small bowel grossly unremarkable. Moderate stool burden in the colon. Vascular/Lymphatic: No evidence of aneurysm or adenopathy. Aortic and iliac calcifications.  Reproductive: No visible focal abnormality. Other: No free fluid or free air. Musculoskeletal: Stranding noted within the subcutaneous soft tissues overlying the right lateral buttocks. There is heterogeneous material within the right gluteus muscles. These findings are likely related to recent hip surgery. Lytic destructive process noted within the left iliac bone on image 86 measuring up to 2.6 cm, unchanged since prior left lower extremity CT. IMPRESSION: Pericholecystic fluid noted. If there is clinical concern for cholecystitis, this could be further evaluated with ultrasound. Lytic destructive lesion within the left lateral iliac bone measuring up to 2.6 cm. It is most likely reflects an area of multiple myeloma or metastasis. Intramuscular hematoma within the right gluteus muscles with subcutaneous stranding, compatible with recent surgery. Electronically Signed   By: Rolm Baptise M.D.   On: 11/25/2017 14:28   US Renal  Result Date: 11/17/2017 CLINICAL DATA:  Acute renal injury EXAM: RENAL / URINARY TRACT ULTRASOUND COMPLETE COMPARISON:  None. FINDINGS: Right Kidney: Length: 10.3 cm. Increased cortical echogenicity. No hydronephrosis. Than renal cortex. Left Kidney: Length: 10.2 cm. Increased cortical echogenicity. No hydronephrosis. Thinned renal cortex. Bladder: Appears normal for degree of bladder distention. IMPRESSION: 1. No hydronephrosis. 2. Increased cortical echogenicity and thinned renal cortex suggesting medical renal disease . Electronically Signed   By: Suzy Bouchard M.D.   On: 11/17/2017 08:32   Ct Femur Left Wo Contrast  Result Date: 11/18/2017 CLINICAL DATA:  Bilateral upper leg pain, worse on the right for 2 weeks. Diffuse weakness. Lytic lesions in the femurs on osseous survey. EXAM: CT OF THE LOWER LEFT EXTREMITY WITHOUT CONTRAST TECHNIQUE: Multidetector CT imaging of the lower left extremity was performed according to the standard protocol. COMPARISON:  Osseous survey  11/17/2017. FINDINGS: Bones/Joint/Cartilage No fracture is identified on the right or left. Multiple foci of endosteal scalloping are seen in the femurs bilaterally consistent with lytic lesions on plain films. Tumor burden appears fairly symmetric on the right and left. Lytic lesions are also identified in the right and left acetabuli. On the left, cortical disruption is seen adjacent to the femoral head in the anterior wall measuring 1.4 cm AP, extending 0.9 cm into bone medially and measuring 2.2 cm craniocaudal. The inner cortical wall is not disrupted. On the right, lysis extends across the medial wall of the acetabulum where there is cortical destruction adjacent to the femoral head measuring 3.5 cm AP AP by 2.0 cm craniocaudal. The inner cortical wall is not disrupted. Multiple foci of fatty marrow replacement are identified in both femurs consistent with tumor deposits. Ligaments Suboptimally assessed by CT. Muscles and Tendons Intact and normal appearance. Soft tissues No acute abnormality. IMPRESSION: Negative for fracture. Multiple lytic lesions are present throughout both femurs with an appearance most consistent with multiple myeloma. Lytic lesions are also seen in the walls of the acetabuli bilaterally most consistent with multiple myeloma. As described above, the inner cortical wall of both the right and left acetabulum is preserved with destruction of the outer cortical wall identified. Electronically Signed   By: Inge Rise M.D.   On: 11/18/2017 10:24   Ct Femur Right Wo Contrast  Result Date: 11/18/2017 CLINICAL DATA:  Bilateral upper leg pain, worse on the right for 2 weeks. Diffuse weakness. Lytic lesions in the femurs on osseous survey. EXAM: CT OF THE LOWER LEFT EXTREMITY WITHOUT CONTRAST TECHNIQUE: Multidetector CT imaging of the lower left extremity was performed according to the standard protocol. COMPARISON:  Osseous survey 11/17/2017. FINDINGS: Bones/Joint/Cartilage No fracture  is identified on the right or left. Multiple foci of endosteal scalloping are seen in the femurs bilaterally consistent with lytic lesions on plain films. Tumor burden appears fairly symmetric on the right and left. Lytic lesions are also identified in the right and left acetabuli. On the left, cortical disruption is seen adjacent to the femoral head in the anterior wall measuring 1.4 cm AP, extending 0.9 cm into bone medially and measuring 2.2 cm craniocaudal. The inner cortical wall is not disrupted. On the right, lysis extends across the medial wall of the acetabulum where there is cortical destruction adjacent to the femoral head measuring 3.5 cm AP AP by 2.0 cm craniocaudal. The inner cortical wall is not disrupted. Multiple foci of fatty marrow replacement are identified in both femurs consistent with tumor deposits. Ligaments Suboptimally assessed by CT. Muscles and Tendons Intact and normal appearance. Soft tissues No acute abnormality. IMPRESSION: Negative for fracture. Multiple lytic lesions are present throughout both femurs with an appearance most consistent with multiple myeloma. Lytic lesions are also seen in the walls of the acetabuli bilaterally most consistent with multiple myeloma. As described above, the inner cortical wall of both the right and left acetabulum is preserved with destruction of the outer cortical wall identified. Electronically Signed   By: Inge Rise M.D.   On: 11/18/2017 10:24   Dg Chest Port 1 View  Result Date: 11/30/2017 CLINICAL DATA:  Short of breath. EXAM: PORTABLE CHEST 1 VIEW COMPARISON:  11/26/2017 FINDINGS: Cardiac silhouette is top-normal in size. No mediastinal or hilar masses. No evidence of adenopathy. There are thickened bronchovascular and interstitial markings, similar to the prior study. These are increased when compared to a chest radiograph dated 04/13/2015. There is no focal lung consolidation to suggest pneumonia. Probable small pleural effusions.  No pneumothorax. Lytic lesion erodes the lateral left eighth rib, unchanged from the recent exam. There are also numerous small lucent bone lesions throughout the visualized skeleton. 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 IMPRESSION: 1. Similar appearance to the prior chest radiograph. Prominent bronchovascular markings and mild interstitial thickening suggests mild interstitial edema. No convincing pneumonia. Electronically Signed   By: Lajean Manes M.D.   On: 11/30/2017 15:06   Dg Chest Port 1 View  Result Date: 11/26/2017 CLINICAL DATA:  61 year old male with shortness of breath and bibasilar crackles EXAM: PORTABLE CHEST 1 VIEW COMPARISON:  11/25/2017 CT, 11/17/2017 chest radiograph and prior studies. FINDINGS: Upper limits normal heart size noted. Pulmonary vascular congestion and interstitial opacities are present likely representing interstitial edema. Increased opacity at the right lung base may represent atelectasis, focal edema or possibly superimposed pneumonia. A lytic lesion within the anterolateral left eighth rib is noted. IMPRESSION: 1. Pulmonary vascular congestion and probable interstitial pulmonary edema. 2. Right basilar opacity which may represent atelectasis, focal edema or possibly superimposed pneumonia. 3. Left eighth rib lytic lesion -question multiple myeloma or metastatic disease. Electronically Signed   By: Margarette Canada M.D.   On: 11/26/2017 11:08   Dg Bone Survey Met  Result Date: 11/17/2017 CLINICAL DATA:  He is complaining of numbness in his distal arms and legs which has  been present for the last 2 weeks. It is been generally getting worse. He also has noted a painful knot in the right rib cage. He denies fever, chills, sweats.*comment was truncated* in a have a EXAM: METASTATIC BONE SURVEY COMPARISON:  Head -CT 11/17/2017 FINDINGS: Skull: Multiple lytic lesions throughout the calvarium. Lytic lesions scalloped the cortex  of the LEFT and RIGHT humeri and clavicles. Lytic lesions in the radius and ulna of the forearms Lytic lesions scalloping enter cortex of the LEFT RIGHT femur. Lytic lesions within the fibulae. IMPRESSION: 1. Lytic lesions scalloping the inner cortex of the axillary and appendicular skeleton including the skull, clavicles, humeri, femurs and distal long bones. 2. Findings most suggestive multiple myeloma. Electronically Signed   By: Suzy Bouchard M.D.   On: 11/17/2017 09:50   Ct Bone Marrow Biopsy & Aspiration  Result Date: 11/20/2017 INDICATION: 61 year old male with a history of multiple myeloma EXAM: CT BONE MARROW BIOPSY AND ASPIRATION MEDICATIONS: None. ANESTHESIA/SEDATION: Moderate (conscious) sedation was employed during this procedure. A total of Versed 2.0 mg and Fentanyl 2 [place] mcg was administered intravenously. Moderate Sedation Time: 11 minutes. The patient's level of consciousness and vital signs were monitored continuously by radiology nursing throughout the procedure under my direct supervision. FLUOROSCOPY TIME:  CT COMPLICATIONS: None PROCEDURE: The procedure risks, benefits, and alternatives were explained to the patient. Questions regarding the procedure were encouraged and answered. The patient understands and consents to the procedure. Scout CT of the pelvis was performed for surgical planning purposes. The posterior pelvis was prepped with chlorhexidinein a sterile fashion, and a sterile drape was applied covering the operative field. A sterile gown and sterile gloves were used for the procedure. Local anesthesia was provided with 1% Lidocaine. We targeted the left posterior iliac bone for biopsy. The skin and subcutaneous tissues were infiltrated with 1% lidocaine without epinephrine. A small stab incision was made with an 11 blade scalpel, and an 11 gauge Murphy needle was advanced with CT guidance to the posterior cortex. Manual forced was used to advance the needle through the  posterior cortex and the stylet was removed. A bone marrow aspirate was retrieved and passed to a cytotechnologist in the room. The Murphy needle was then advanced without the stylet for a core biopsy. The core biopsy was retrieved and also passed to a cytotechnologist. Manual pressure was used for hemostasis and a sterile dressing was placed. No complications were encountered no significant blood loss was encountered. Patient tolerated the procedure well and remained hemodynamically stable throughout. IMPRESSION: Status post CT-guided bone marrow biopsy, with tissue specimen sent to pathology for complete histopathologic analysis Signed, Dulcy Fanny. Earleen Newport, DO [facility] Radiology Electronically Signed   By: Corrie Mckusick D.O.   On: 11/20/2017 10:06   Dg C-arm 1-60 Min  Result Date: 11/20/2017 CLINICAL DATA:  Status post inter medullary nail and screw placement for impending right femur fracture due to metastatic disease. Multiple myeloma. EXAM: RIGHT FEMUR PORTABLE 2 VIEW; DG C-ARM 61-120 MIN COMPARISON:  CT scan of the femurs of November 17, 2017 FINDINGS: Fluoro time reported is 1 minutes, 24 seconds. Two fluoro spot images are observed. An intramedullary rod within the femoral shaft present. There are 2 telescoping screws in the right femoral neck and head and securing screw in the lower right femoral shaft. There is no immediate postprocedure complication. IMPRESSION: No immediate postprocedure complication following intramedullary nail placement of the right femur. Electronically Signed   By: David  Martinique  M.D.   On: 11/20/2017 13:41   Dg Femur Port, Min 2 Views Right  Result Date: 11/20/2017 CLINICAL DATA:  Status post inter medullary nail and screw placement for impending right femur fracture due to metastatic disease. Multiple myeloma. EXAM: RIGHT FEMUR PORTABLE 2 VIEW; DG C-ARM 61-120 MIN COMPARISON:  CT scan of the femurs of November 17, 2017  FINDINGS: Fluoro time reported is 1 minutes, 24 seconds. Two fluoro spot images are observed. An intramedullary rod within the femoral shaft present. There are 2 telescoping screws in the right femoral neck and head and securing screw in the lower right femoral shaft. There is no immediate postprocedure complication. IMPRESSION: No immediate postprocedure complication following intramedullary nail placement of the right femur. Electronically Signed   By: David  Martinique M.D.   On: 11/20/2017 13:41   US Abdomen Limited Ruq  Result Date: 11/29/2017 CLINICAL DATA:  Elevated LFTs, pericholecystic fluid by CT, history hypertension, hepatitis-C EXAM: ULTRASOUND ABDOMEN LIMITED RIGHT UPPER QUADRANT COMPARISON:  CT abdomen and pelvis 11/25/2017 FINDINGS: Gallbladder: Thickened gallbladder wall or pericholecystic fluid. No gallstones or sonographic Murphy sign identified. Common bile duct: Diameter: 4 mm diameter , normal Liver: No focal lesion identified. Within normal limits in parenchymal echogenicity. Portal vein is patent on color Doppler imaging with normal direction of blood flow towards the liver. No RIGHT upper quadrant free fluid Incidentally noted increased parenchymal echogenicity of the RIGHT kidney suggesting medical renal disease. IMPRESSION: Thickened gallbladder wall and pericholecystic fluid without definite sonographic Murphy sign or gallstones ; findings are highly suspicious for cholecystitis. Question medical disease changes of RIGHT kidney. Electronically Signed   By: Lavonia Dana M.D.   On: 11/29/2017 13:46    Microbiology: No results found for this or any previous visit (from the past 240 hour(s)).   Labs: Basic Metabolic Panel: Recent Labs  Lab 12/02/17 0314 12/03/17 0330 12/05/17 0306 12/05/17 1158 12/06/17 1251  NA 145 142 144 144 145  K 5.1 5.3* 5.2* 5.2* 5.4*  CL 111 111 116* 116* 115*  CO2 23 19* 17* 18* 18*  GLUCOSE 146* 118* 96 97 103*  BUN 86* 99* 101* 97* 97*    CREATININE 4.62* 4.39* 4.61* 4.74* 4.83*  CALCIUM 8.8* 8.8* 8.9 9.2 9.2   Liver Function Tests: Recent Labs  Lab 11/30/17 0322 12/01/17 0336 12/02/17 0314 12/05/17 0306  AST 100* 45* 31 23  ALT 43 37 38 35  ALKPHOS 71 65 64 72  BILITOT 0.7 0.7 0.4 0.8  PROT 5.9* 6.1* 6.5 6.4*  ALBUMIN 2.5* 2.5* 2.5* 2.7*   No results for input(s): LIPASE, AMYLASE in the last 168 hours. No results for input(s): AMMONIA in the last 168 hours. CBC: Recent Labs  Lab 12/01/17 0336 12/02/17 0314 12/03/17 0330 12/04/17 0301 12/05/17 0306 12/05/17 1158  WBC 8.1 8.3 3.9* 3.8* 4.7 3.6*  NEUTROABS 5.9  --   --  2.7  --   --   HGB 8.4* 8.3* 7.8* 8.3* 8.2* 8.2*  HCT 25.8* 24.5* 23.7* 25.1* 24.7* 24.5*  MCV 92.1 91.8 90.8 90.9 91.1 91.4  PLT 70* 77* 65* 56* 45* 44*   Cardiac Enzymes: No results for input(s): CKTOTAL, CKMB, CKMBINDEX, TROPONINI in the last 168 hours. BNP: BNP (last 3 results) No results for input(s): BNP in the last 8760 hours.  ProBNP (last 3 results) No results for input(s): PROBNP in the last 8760 hours.  CBG: No results for input(s): GLUCAP in the last 168 hours.     Signed:  Archie Patten  Malachi Carl, MD [facility] 12/06/2017, 3:36 PM

## 2017-12-06 NOTE — Progress Notes (Signed)
Notified by bedside nurse that orders were put in post d/c for [facility] nurse. Attempted to connect someone with them for [facility] but unable to find an agency who would accept Center. Notified attending of this and she was ok with no further action, she felt patient was ok to manage himself.

## 2017-12-06 NOTE — Progress Notes (Signed)
PT Cancellation Note  Patient Details Name: PHOENYX PAULSEN MRN: [id number] DOB: [birth year]   Cancelled Treatment:    Reason Eval/Treat Not Completed: Other (comment)(pt reports taking Miralax and does not wish to ambulate today due to fear of "accident" in hallway)   Tyreisha Ungar,KATHrine E 12/06/2017, 10:43 AM Carmelia Bake, PT, DPT 12/06/2017 Pager: [phone]

## 2017-12-06 NOTE — Discharge Instructions (Signed)

## 2017-12-06 NOTE — Progress Notes (Signed)
Pt discharged to home, instructions given to patient acknowledge understanding. SRP ,RN

## 2017-12-12 ENCOUNTER — Telehealth: Payer: Self-pay | Admitting: Hematology

## 2017-12-12 ENCOUNTER — Telehealth: Payer: Self-pay

## 2017-12-12 NOTE — Telephone Encounter (Signed)
Scheduled appt per 1/2 sch msg - left voicemail for patient regarding appts.

## 2017-12-12 NOTE — Telephone Encounter (Signed)
Scheduling message sent high priority to have lab appt added for pt prior to doctor visit on 12/14/17.

## 2017-12-13 ENCOUNTER — Encounter (HOSPITAL_COMMUNITY): Payer: Self-pay

## 2017-12-13 LAB — TISSUE HYBRIDIZATION (BONE MARROW)-NCBH

## 2017-12-14 ENCOUNTER — Inpatient Hospital Stay: Payer: BLUE CROSS/BLUE SHIELD | Attending: Hematology | Admitting: Hematology

## 2017-12-14 ENCOUNTER — Other Ambulatory Visit (HOSPITAL_BASED_OUTPATIENT_CLINIC_OR_DEPARTMENT_OTHER): Payer: BLUE CROSS/BLUE SHIELD

## 2017-12-14 ENCOUNTER — Encounter: Payer: Self-pay | Admitting: Hematology

## 2017-12-14 VITALS — BP 108/62 | HR 95 | Temp 98.5°F | Resp 18 | Ht 68.0 in | Wt 134.4 lb

## 2017-12-14 DIAGNOSIS — R109 Unspecified abdominal pain: Secondary | ICD-10-CM | POA: Insufficient documentation

## 2017-12-14 DIAGNOSIS — C9010 Plasma cell leukemia not having achieved remission: Secondary | ICD-10-CM

## 2017-12-14 DIAGNOSIS — D649 Anemia, unspecified: Secondary | ICD-10-CM | POA: Diagnosis not present

## 2017-12-14 DIAGNOSIS — N400 Enlarged prostate without lower urinary tract symptoms: Secondary | ICD-10-CM | POA: Insufficient documentation

## 2017-12-14 DIAGNOSIS — B1920 Unspecified viral hepatitis C without hepatic coma: Secondary | ICD-10-CM | POA: Insufficient documentation

## 2017-12-14 DIAGNOSIS — R42 Dizziness and giddiness: Secondary | ICD-10-CM | POA: Insufficient documentation

## 2017-12-14 DIAGNOSIS — R682 Dry mouth, unspecified: Secondary | ICD-10-CM | POA: Insufficient documentation

## 2017-12-14 DIAGNOSIS — F329 Major depressive disorder, single episode, unspecified: Secondary | ICD-10-CM | POA: Insufficient documentation

## 2017-12-14 DIAGNOSIS — Z9889 Other specified postprocedural states: Secondary | ICD-10-CM | POA: Insufficient documentation

## 2017-12-14 DIAGNOSIS — D696 Thrombocytopenia, unspecified: Secondary | ICD-10-CM

## 2017-12-14 DIAGNOSIS — C9000 Multiple myeloma not having achieved remission: Secondary | ICD-10-CM

## 2017-12-14 DIAGNOSIS — R0602 Shortness of breath: Secondary | ICD-10-CM | POA: Insufficient documentation

## 2017-12-14 DIAGNOSIS — N289 Disorder of kidney and ureter, unspecified: Secondary | ICD-10-CM | POA: Insufficient documentation

## 2017-12-14 DIAGNOSIS — F17210 Nicotine dependence, cigarettes, uncomplicated: Secondary | ICD-10-CM | POA: Insufficient documentation

## 2017-12-14 DIAGNOSIS — D6481 Anemia due to antineoplastic chemotherapy: Secondary | ICD-10-CM

## 2017-12-14 DIAGNOSIS — Z79899 Other long term (current) drug therapy: Secondary | ICD-10-CM | POA: Insufficient documentation

## 2017-12-14 DIAGNOSIS — G8918 Other acute postprocedural pain: Secondary | ICD-10-CM | POA: Insufficient documentation

## 2017-12-14 DIAGNOSIS — R5383 Other fatigue: Secondary | ICD-10-CM | POA: Insufficient documentation

## 2017-12-14 DIAGNOSIS — E875 Hyperkalemia: Secondary | ICD-10-CM

## 2017-12-14 DIAGNOSIS — T451X5A Adverse effect of antineoplastic and immunosuppressive drugs, initial encounter: Secondary | ICD-10-CM

## 2017-12-14 DIAGNOSIS — F1021 Alcohol dependence, in remission: Secondary | ICD-10-CM | POA: Insufficient documentation

## 2017-12-14 DIAGNOSIS — I10 Essential (primary) hypertension: Secondary | ICD-10-CM | POA: Insufficient documentation

## 2017-12-14 DIAGNOSIS — D630 Anemia in neoplastic disease: Secondary | ICD-10-CM | POA: Insufficient documentation

## 2017-12-14 DIAGNOSIS — Z5111 Encounter for antineoplastic chemotherapy: Secondary | ICD-10-CM | POA: Insufficient documentation

## 2017-12-14 LAB — COMPREHENSIVE METABOLIC PANEL
ALBUMIN: 2.7 g/dL — AB (ref 3.5–5.0)
ALK PHOS: 100 U/L (ref 40–150)
ALT: 21 U/L (ref 0–55)
ANION GAP: 8 meq/L (ref 3–11)
AST: 15 U/L (ref 5–34)
BILIRUBIN TOTAL: 0.35 mg/dL (ref 0.20–1.20)
BUN: 40.6 mg/dL — ABNORMAL HIGH (ref 7.0–26.0)
CO2: 20 meq/L — AB (ref 22–29)
Calcium: 9.2 mg/dL (ref 8.4–10.4)
Chloride: 109 mEq/L (ref 98–109)
Creatinine: 3.3 mg/dL (ref 0.7–1.3)
EGFR: 22 mL/min/{1.73_m2} — AB (ref >60)
Glucose: 102 mg/dl (ref 70–140)
Potassium: 5.2 mEq/L — ABNORMAL HIGH (ref 3.5–5.1)
Sodium: 137 mEq/L (ref 136–145)
TOTAL PROTEIN: 6.1 g/dL — AB (ref 6.4–8.3)

## 2017-12-14 LAB — CBC WITH DIFFERENTIAL/PLATELET
BASO%: 1.4 % (ref 0.0–2.0)
Basophils Absolute: 0.1 10*3/uL (ref 0.0–0.1)
EOS%: 1 % (ref 0.0–7.0)
Eosinophils Absolute: 0.1 10*3/uL (ref 0.0–0.5)
HCT: 23.9 % — ABNORMAL LOW (ref 38.4–49.9)
HEMOGLOBIN: 8.3 g/dL — AB (ref 13.0–17.1)
LYMPH%: 19.3 % (ref 14.0–49.0)
MCH: 30.6 pg (ref 27.2–33.4)
MCHC: 34.6 g/dL (ref 32.0–36.0)
MCV: 88.4 fL (ref 79.3–98.0)
MONO#: 0.6 10*3/uL (ref 0.1–0.9)
MONO%: 8 % (ref 0.0–14.0)
NEUT%: 70.3 % (ref 39.0–75.0)
NEUTROS ABS: 5.2 10*3/uL (ref 1.5–6.5)
Platelets: 524 10*3/uL — ABNORMAL HIGH (ref 140–400)
RBC: 2.71 10*6/uL — ABNORMAL LOW (ref 4.20–5.82)
RDW: 15.3 % — AB (ref 11.0–14.6)
WBC: 7.4 10*3/uL (ref 4.0–10.3)
lymph#: 1.4 10*3/uL (ref 0.9–3.3)

## 2017-12-14 LAB — LACTATE DEHYDROGENASE: LDH: 236 U/L (ref 125–245)

## 2017-12-14 LAB — CHCC SMEAR

## 2017-12-14 LAB — URIC ACID: Uric Acid, Serum: 6.1 mg/dl (ref 2.6–7.4)

## 2017-12-14 MED ORDER — ACYCLOVIR 400 MG PO TABS: 400.0000 mg | ORAL_TABLET | Freq: Every day | ORAL | 6 refills | Status: DC

## 2017-12-14 MED ORDER — DEXAMETHASONE 4 MG PO TABS: 20.0000 mg | ORAL_TABLET | ORAL | 1 refills | Status: DC

## 2017-12-14 MED ORDER — NICOTINE 14 MG/24HR TD PT24: 14.0000 mg | MEDICATED_PATCH | Freq: Every day | TRANSDERMAL | 1 refills | Status: DC

## 2017-12-14 MED ORDER — ONDANSETRON HCL 4 MG PO TABS: 8.0000 mg | ORAL_TABLET | Freq: Three times a day (TID) | ORAL | 0 refills | Status: DC | PRN

## 2017-12-14 NOTE — Progress Notes (Signed)
.   HEMATOLOGY/ONCOLOGY OUTPATIENT PROGRESS NOTE  Date of Service: 12/14/17  CC: post hospitalization f/u for myeloma/plasma cell leukemia.   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 His labs today 12/14/2017 show hgb 8.3, platelets 524k. He is doing well overall. He has staples in his right upper leg following a recent surgery and has not been in touch with the surgeons for a f/u of this. He is not receiving physical therapy and is not getting around at home.  He notes that he was not scheduled for a post hospitalization nephrology f/u and has no PCP. He is due to start his 2nd cycle of treatment.  On review of systems, pt reports intermittent fatigue, SOB, pain to the left leg, dizziness, dry mouth and denies fever, chills, night sweats, dysuria and any other accompanying symptoms.   NAD PHYSICAL EXAMINATION: VS reviewed GENERAL:alert, in no acute distress and comfortable SKIN: no acute rashes, no significant lesions EYES: conjunctiva are pink and non-injected, sclera anicteric OROPHARYNX: MMM, no exudates, no oropharyngeal erythema or ulceration NECK: supple,  JVD mild we connected about that medication he gave me like that told him LYMPH:  no palpable lymphadenopathy in the cervical, axillary or inguinal regions LUNGS: no basal rales. HEART: regular rate & rhythm ABDOMEN:  normoactive bowel sounds , non tender, not distended. Extremity: no pedal edema PSYCH: alert & oriented x 3 with fluent speech NEURO: no focal motor/sensory deficits   MEDICAL HISTORY:  Past Medical History:  Diagnosis Date  . BPH (benign prostatic hyperplasia)   . Hepatitis C 11/27/2017  . Hypertension   . 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 Tobacco abuse     SURGICAL HISTORY: Past Surgical History:  Procedure Laterality Date  . APPENDECTOMY    . FEMUR IM NAIL Right 11/20/2017   Procedure: RIGHT FEMORAL INTRAMEDULLARY (IM) NAIL;  Surgeon: Rogers, Jason  Patrick, MD;  Location: [facility] OR;  Service: Orthopedics;  Laterality: Right;    SOCIAL HISTORY: Social History   Socioeconomic History  . Marital status: Single    Spouse name: Not on file  . Number of children: Not on file  . Years of education: Not on file  . Highest education level: Not on file  Social Needs  . Financial resource strain: Not on file  . Food insecurity - worry: Not on file  . Food insecurity - inability: Not on file  . Transportation needs - medical: Not on file  . Transportation needs - non-medical: Not on file  Occupational History  . Not on file  Tobacco Use  . Smoking status: Former Smoker    Packs/day: 0.50    Types: Cigarettes    Last attempt to quit: 11/09/2017    Years since quitting: 0.0  . Smokeless tobacco: Never Used  . Tobacco comment: no cigarettes x 1 week.  Substance and Sexual Activity  . Alcohol use: Yes  . Drug use: No  . Sexual activity: Not on file  Other Topics Concern  . Not on file  Social History Narrative  . Not on file    FAMILY HISTORY: Family History  Problem Relation Age of Onset  . COPD Mother   . Liver cancer Mother     ALLERGIES:  has No Known Allergies.  MEDICATIONS:  Scheduled Meds:  Continuous Infusions:  PRN Meds:.  REVIEW OF SYSTEMS:    10 Point review of Systems was done is negative except as noted above.   LABORATORY   DATA:  I have reviewed the data as listed  . CBC Latest Ref Rng & Units 12/14/2017 12/05/2017 12/05/2017  WBC 4.0 - 10.3 10e3/uL 7.4 3.6(L) 4.7  Hemoglobin 13.0 - 17.1 g/dL 8.3(L) 8.2(L) 8.2(L)  Hematocrit 38.4 - 49.9 % 23.9(L) 24.5(L) 24.7(L)  Platelets 140 - 400 10e3/uL 524(H) 44(L) 45(L)   . CBC    Component Value Date/Time   WBC 7.4 12/14/2017 1230   WBC 3.6 (L) 12/05/2017 1158   RBC 2.71 (L) 12/14/2017 1230   RBC 2.68 (L) 12/05/2017 1158   HGB 8.3 (L) 12/14/2017 1230   HCT 23.9 (L) 12/14/2017 1230   PLT 524 (H) 12/14/2017 1230   MCV 88.4 12/14/2017 1230   MCH 30.6  12/14/2017 1230   MCH 30.6 12/05/2017 1158   MCHC 34.6 12/14/2017 1230   MCHC 33.5 12/05/2017 1158   RDW 15.3 (H) 12/14/2017 1230   LYMPHSABS 1.4 12/14/2017 1230   MONOABS 0.6 12/14/2017 1230   EOSABS 0.1 12/14/2017 1230   BASOSABS 0.1 12/14/2017 1230     . CMP Latest Ref Rng & Units 12/14/2017 12/06/2017 12/05/2017  Glucose 70 - 140 mg/dl 102 103(H) 97  BUN 7.0 - 26.0 mg/dL 40.6(H) 97(H) 97(H)  Creatinine 0.7 - 1.3 mg/dL 3.3(HH) 4.83(H) 4.74(H)  Sodium 136 - 145 mEq/L 137 145 144  Potassium 3.5 - 5.1 mEq/L 5.2(H) 5.4(H) 5.2(H)  Chloride 101 - 111 mmol/L - 115(H) 116(H)  CO2 22 - 29 mEq/L 20(L) 18(L) 18(L)  Calcium 8.4 - 10.4 mg/dL 9.2 9.2 9.2  Total Protein 6.4 - 8.3 g/dL 6.1(L) - -  Total Bilirubin 0.20 - 1.20 mg/dL 0.35 - -  Alkaline Phos 40 - 150 U/L 100 - -  AST 5 - 34 U/L 15 - -  ALT 0 - 55 U/L 21 - -     Component     Latest Ref Rng & Units 11/17/2017  Prostatic Specific Antigen     0.00 - 4.00 ng/mL 0.83  HIV Screen 4th Generation wRfx     Non Reactive Non Reactive  TSH     0.350 - 4.500 uIU/mL 1.874  Beta-2 Microglobulin     0.6 - 2.4 mg/L 25.7 (H)             RADIOGRAPHIC STUDIES: I have personally reviewed the radiological images as listed and agreed with the findings in the report. Ct Abdomen Pelvis Wo Contrast  Result Date: 11/25/2017 CLINICAL DATA:  Fever of unknown origin EXAM: CT CHEST, ABDOMEN AND PELVIS WITHOUT CONTRAST TECHNIQUE: Multidetector CT imaging of the chest, abdomen and pelvis was performed following the standard protocol without IV contrast. COMPARISON:  Left lower extremity CT performed 11/17/2017. FINDINGS: CT CHEST FINDINGS Cardiovascular: Heart is borderline enlarged. Aorta is normal caliber with scattered aortic calcifications. Mediastinum/Nodes: No mediastinal, hilar, or axillary adenopathy. Lungs/Pleura: Severe centrilobular and paraseptal emphysema. Trace left pleural effusion. No confluent airspace opacities.  Musculoskeletal: No acute bony abnormality. CT ABDOMEN PELVIS FINDINGS Hepatobiliary: Pericholecystic fluid present. No visible focal hepatic abnormality. Pancreas: No focal abnormality or ductal dilatation. Spleen: No focal abnormality. Normal size. Small calcification in the superior spleen. Adrenals/Urinary Tract: No adrenal abnormality. No focal renal abnormality. No stones or hydronephrosis. Urinary bladder is unremarkable. Stomach/Bowel: Stomach, large and small bowel grossly unremarkable. Moderate stool burden in the colon. Vascular/Lymphatic: No evidence of aneurysm or adenopathy. Aortic and iliac calcifications. Reproductive: No visible focal abnormality. Other: No free fluid or free air. Musculoskeletal: Stranding noted within the subcutaneous soft tissues overlying the right lateral  buttocks. There is heterogeneous material within the right gluteus muscles. These findings are likely related to recent hip surgery. Lytic destructive process noted within the left iliac bone on image 86 measuring up to 2.6 cm, unchanged since prior left lower extremity CT. IMPRESSION: Pericholecystic fluid noted. If there is clinical concern for cholecystitis, this could be further evaluated with ultrasound. Lytic destructive lesion within the left lateral iliac bone measuring up to 2.6 cm. It is most likely reflects an area of multiple myeloma or metastasis. Intramuscular hematoma within the right gluteus muscles with subcutaneous stranding, compatible with recent surgery. Electronically Signed   By: Rolm Baptise M.D.   On: 11/25/2017 14:28   Dg Chest 2 View  Result Date: 11/17/2017 CLINICAL DATA:  62 year old male with chest pain. EXAM: CHEST  2 VIEW COMPARISON:  Chest radiograph dated 05/10/2015 FINDINGS: Mild right lung base atelectatic changes/ scarring. There is no focal consolidation, pleural effusion, or pneumothorax. The cardiac silhouette is within normal limits. There is mild degenerative changes of the spine.  No acute osseous pathology. IMPRESSION: No active cardiopulmonary disease. Electronically Signed   By: Anner Crete M.D.   On: 11/17/2017 07:08   Ct Head Wo Contrast  Result Date: 11/17/2017 CLINICAL DATA:  62 year old male with numbness and tingling. Paraplegia. EXAM: CT HEAD WITHOUT CONTRAST TECHNIQUE: Contiguous axial images were obtained from the base of the skull through the vertex without intravenous contrast. COMPARISON:  Facial bone CT dated 03/27/2010 FINDINGS: Brain: The ventricles and sulci appropriate size for patient's age. Minimal periventricular and deep white matter chronic microvascular ischemic changes noted. There is no acute intracranial hemorrhage. No mass effect or midline shift noted. No extra-axial fluid collection. Vascular: There is slight high attenuation of the right MCA in the sylvian fissure (series 7 image 12). This may be chronic. If there is clinical concern for right MCA territory ischemia further evaluation with CT angiography of the head or MRI is recommended. Skull: There are innumerable lucent calvarial lesions concerning for multiple myeloma or metastatic disease. Correlation with history of known malignancy recommended. No acute fracture. Sinuses/Orbits: There is complete opacification of the visualized right maxillary sinus. Mild mucoperiosteal thickening of paranasal sinuses. There is a lytic lesion involving the posterior right mastoid versus effusion. Other: None IMPRESSION: 1. No acute intracranial hemorrhage. 2. Slightly high attenuation of the right MCA in the right sylvian fissure which may be chronic. Further evaluation with CTA or MRI is recommended if there is high clinical concern for acute right MCA territory ischemia. 3. 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 Correlation with history of malignancy recommended. Electronically Signed   By: Anner Crete M.D.   On: 11/17/2017 06:05   Ct Chest Wo Contrast  Result Date:  11/25/2017 CLINICAL DATA:  Fever of unknown origin EXAM: CT CHEST, ABDOMEN AND PELVIS WITHOUT CONTRAST TECHNIQUE: Multidetector CT imaging of the chest, abdomen and pelvis was performed following the standard protocol without IV contrast. COMPARISON:  Left lower extremity CT performed 11/17/2017. FINDINGS: CT CHEST FINDINGS Cardiovascular: Heart is borderline enlarged. Aorta is normal caliber with scattered aortic calcifications. Mediastinum/Nodes: No mediastinal, hilar, or axillary adenopathy. Lungs/Pleura: Severe centrilobular and paraseptal emphysema. Trace left pleural effusion. No confluent airspace opacities. Musculoskeletal: No acute bony abnormality. CT ABDOMEN PELVIS FINDINGS Hepatobiliary: Pericholecystic fluid present. No visible focal hepatic abnormality. Pancreas: No focal abnormality or ductal dilatation. Spleen: No focal abnormality. Normal size. Small calcification in the superior spleen. Adrenals/Urinary Tract: No adrenal abnormality. No focal renal abnormality. No stones or hydronephrosis.  Urinary bladder is unremarkable. Stomach/Bowel: Stomach, large and small bowel grossly unremarkable. Moderate stool burden in the colon. Vascular/Lymphatic: No evidence of aneurysm or adenopathy. Aortic and iliac calcifications. Reproductive: No visible focal abnormality. Other: No free fluid or free air. Musculoskeletal: Stranding noted within the subcutaneous soft tissues overlying the right lateral buttocks. There is heterogeneous material within the right gluteus muscles. These findings are likely related to recent hip surgery. Lytic destructive process noted within the left iliac bone on image 86 measuring up to 2.6 cm, unchanged since prior left lower extremity CT. IMPRESSION: Pericholecystic fluid noted. If there is clinical concern for cholecystitis, this could be further evaluated with ultrasound. Lytic destructive lesion within the left lateral iliac bone measuring up to 2.6 cm. It is most likely  reflects an area of multiple myeloma or metastasis. Intramuscular hematoma within the right gluteus muscles with subcutaneous stranding, compatible with recent surgery. Electronically Signed   By: Kevin  Dover M.D.   On: 11/25/2017 14:28   Us Renal  Result Date: 11/17/2017 CLINICAL DATA:  Acute renal injury EXAM: RENAL / URINARY TRACT ULTRASOUND COMPLETE COMPARISON:  None. FINDINGS: Right Kidney: Length: 10.3 cm. Increased cortical echogenicity. No hydronephrosis. Than renal cortex. Left Kidney: Length: 10.2 cm. Increased cortical echogenicity. No hydronephrosis. Thinned renal cortex. Bladder: Appears normal for degree of bladder distention. IMPRESSION: 1. No hydronephrosis. 2. Increased cortical echogenicity and thinned renal cortex suggesting medical renal disease . Electronically Signed   By: Stewart  Edmunds M.D.   On: 11/17/2017 08:32   Ct Femur Left Wo Contrast  Result Date: 11/18/2017 CLINICAL DATA:  Bilateral upper leg pain, worse on the right for 2 weeks. Diffuse weakness. Lytic lesions in the femurs on osseous survey. EXAM: CT OF THE LOWER LEFT EXTREMITY WITHOUT CONTRAST TECHNIQUE: Multidetector CT imaging of the lower left extremity was performed according to the standard protocol. COMPARISON:  Osseous survey 11/17/2017. FINDINGS: Bones/Joint/Cartilage No fracture is identified on the right or left. Multiple foci of endosteal scalloping are seen in the femurs bilaterally consistent with lytic lesions on plain films. Tumor burden appears fairly symmetric on the right and left. Lytic lesions are also identified in the right and left acetabuli. On the left, cortical disruption is seen adjacent to the femoral head in the anterior wall measuring 1.4 cm AP, extending 0.9 cm into bone medially and measuring 2.2 cm craniocaudal. The inner cortical wall is not disrupted. On the right, lysis extends across the medial wall of the acetabulum where there is cortical destruction adjacent to the femoral head  measuring 3.5 cm AP AP by 2.0 cm craniocaudal. The inner cortical wall is not disrupted. Multiple foci of fatty marrow replacement are identified in both femurs consistent with tumor deposits. Ligaments Suboptimally assessed by CT. Muscles and Tendons Intact and normal appearance. Soft tissues No acute abnormality. IMPRESSION: Negative for fracture. Multiple lytic lesions are present throughout both femurs with an appearance most consistent with multiple myeloma. Lytic lesions are also seen in the walls of the acetabuli bilaterally most consistent with multiple myeloma. As described above, the inner cortical wall of both the right and left acetabulum is preserved with destruction of the outer cortical wall identified. Electronically Signed   By: Thomas  Dalessio M.D.   On: 11/18/2017 10:24   Ct Femur Right Wo Contrast  Result Date: 11/18/2017 CLINICAL DATA:  Bilateral upper leg pain, worse on the right for 2 weeks. Diffuse weakness. Lytic lesions in the femurs on osseous survey. EXAM: CT OF THE LOWER LEFT EXTREMITY   WITHOUT CONTRAST TECHNIQUE: Multidetector CT imaging of the lower left extremity was performed according to the standard protocol. COMPARISON:  Osseous survey 11/17/2017. FINDINGS: Bones/Joint/Cartilage No fracture is identified on the right or left. Multiple foci of endosteal scalloping are seen in the femurs bilaterally consistent with lytic lesions on plain films. Tumor burden appears fairly symmetric on the right and left. Lytic lesions are also identified in the right and left acetabuli. On the left, cortical disruption is seen adjacent to the femoral head in the anterior wall measuring 1.4 cm AP, extending 0.9 cm into bone medially and measuring 2.2 cm craniocaudal. The inner cortical wall is not disrupted. On the right, lysis extends across the medial wall of the acetabulum where there is cortical destruction adjacent to the femoral head measuring 3.5 cm AP AP by 2.0 cm craniocaudal. The inner  cortical wall is not disrupted. Multiple foci of fatty marrow replacement are identified in both femurs consistent with tumor deposits. Ligaments Suboptimally assessed by CT. Muscles and Tendons Intact and normal appearance. Soft tissues No acute abnormality. IMPRESSION: Negative for fracture. Multiple lytic lesions are present throughout both femurs with an appearance most consistent with multiple myeloma. Lytic lesions are also seen in the walls of the acetabuli bilaterally most consistent with multiple myeloma. As described above, the inner cortical wall of both the right and left acetabulum is preserved with destruction of the outer cortical wall identified. Electronically Signed   By: Inge Rise M.D.   On: 11/18/2017 10:24   Dg Chest Port 1 View  Result Date: 11/30/2017 CLINICAL DATA:  Short of breath. EXAM: PORTABLE CHEST 1 VIEW COMPARISON:  11/26/2017 FINDINGS: Cardiac silhouette is top-normal in size. No mediastinal or hilar masses. No evidence of adenopathy. There are thickened bronchovascular and interstitial markings, similar to the prior study. These are increased when compared to a chest radiograph dated 04/13/2015. There is no focal lung consolidation to suggest pneumonia. Probable small pleural effusions. No pneumothorax. Lytic lesion erodes the lateral left eighth rib, unchanged from the recent exam. There are also numerous small lucent bone lesions throughout the visualized skeleton. 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 IMPRESSION: 1. Similar appearance to the prior chest radiograph. Prominent bronchovascular markings and mild interstitial thickening suggests mild interstitial edema. No convincing pneumonia. Electronically Signed   By: Lajean Manes M.D.   On: 11/30/2017 15:06   Dg Chest Port 1 View  Result Date: 11/26/2017 CLINICAL DATA:  62 year old male with shortness of breath and bibasilar crackles EXAM: PORTABLE CHEST 1 VIEW  COMPARISON:  11/25/2017 CT, 11/17/2017 chest radiograph and prior studies. FINDINGS: Upper limits normal heart size noted. Pulmonary vascular congestion and interstitial opacities are present likely representing interstitial edema. Increased opacity at the right lung base may represent atelectasis, focal edema or possibly superimposed pneumonia. A lytic lesion within the anterolateral left eighth rib is noted. IMPRESSION: 1. Pulmonary vascular congestion and probable interstitial pulmonary edema. 2. Right basilar opacity which may represent atelectasis, focal edema or possibly superimposed pneumonia. 3. Left eighth rib lytic lesion -question multiple myeloma or metastatic disease. Electronically Signed   By: Margarette Canada M.D.   On: 11/26/2017 11:08   Dg Bone Survey Met  Result Date: 11/17/2017 CLINICAL DATA:  He is complaining of numbness in his distal arms and legs which has been present for the last 2 weeks. It is been generally getting worse. He also has noted a painful knot in the right rib cage. He denies fever,  chills, sweats.*comment was truncated* in a have a EXAM: METASTATIC BONE SURVEY COMPARISON:  Head -CT 11/17/2017 FINDINGS: Skull: Multiple lytic lesions throughout the calvarium. Lytic lesions scalloped the cortex of the LEFT and RIGHT humeri and clavicles. Lytic lesions in the radius and ulna of the forearms Lytic lesions scalloping enter cortex of the LEFT RIGHT femur. Lytic lesions within the fibulae. IMPRESSION: 1. Lytic lesions scalloping the inner cortex of the axillary and appendicular skeleton including the skull, clavicles, humeri, femurs and distal long bones. 2. Findings most suggestive multiple myeloma. Electronically Signed   By: Stewart  Edmunds M.D.   On: 11/17/2017 09:50   Ct Bone Marrow Biopsy & Aspiration  Result Date: 11/20/2017 INDICATION: 61-year-old male with a history of multiple myeloma EXAM: CT BONE MARROW BIOPSY AND ASPIRATION MEDICATIONS: None. ANESTHESIA/SEDATION:  Moderate (conscious) sedation was employed during this procedure. A total of Versed 2.0 mg and Fentanyl 2 Hunter mcg was administered intravenously. Moderate Sedation Time: 11 minutes. The patient's level of consciousness and vital signs were monitored continuously by radiology nursing throughout the procedure under my direct supervision. FLUOROSCOPY TIME:  CT COMPLICATIONS: None PROCEDURE: The procedure risks, benefits, and alternatives were explained to the patient. Questions regarding the procedure were encouraged and answered. The patient understands and consents to the procedure. Scout CT of the pelvis was performed for surgical planning purposes. The posterior pelvis was prepped with chlorhexidinein a sterile fashion, and a sterile drape was applied covering the operative field. A sterile gown and sterile gloves were used for the procedure. Local anesthesia was provided with 1% Lidocaine. We targeted the left posterior iliac bone for biopsy. The skin and subcutaneous tissues were infiltrated with 1% lidocaine without epinephrine. A small stab incision was made with an 11 blade scalpel, and an 11 gauge Murphy needle was advanced with CT guidance to the posterior cortex. Manual forced was used to advance the needle through the posterior cortex and the stylet was removed. A bone marrow aspirate was retrieved and passed to a cytotechnologist in the room. The Murphy needle was then advanced without the stylet for a core biopsy. The core biopsy was retrieved and also passed to a cytotechnologist. Manual pressure was used for hemostasis and a sterile dressing was placed. No complications were encountered no significant blood loss was encountered. Patient tolerated the procedure well and remained hemodynamically stable throughout. IMPRESSION: Status post CT-guided bone marrow biopsy, with tissue specimen sent to pathology for complete histopathologic analysis Signed, Jaime S. Wagner, DO [facility] Electronically Signed   By: Jaime  Wagner D.O.   On: 11/20/2017 10:06   Dg C-arm 1-60 Min  Result Date: 11/20/2017 CLINICAL DATA:  Status post inter medullary nail and screw placement for impending right femur fracture due to metastatic disease. Multiple myeloma. EXAM: RIGHT FEMUR PORTABLE 2 VIEW; DG C-ARM 61-120 MIN COMPARISON:  CT scan of the femurs of November 17, 2017 FINDINGS: Fluoro time reported is 1 minutes, 24 seconds. Two fluoro spot images are observed. An intramedullary rod within the femoral shaft present. There are 2 telescoping screws in the right femoral neck and head and securing screw in the lower right femoral shaft. There is no immediate postprocedure complication. IMPRESSION: No immediate postprocedure complication following intramedullary nail placement of the right femur. Electronically Signed   By: David  Jordan M.D.   On: 11/20/2017 13:41   Dg Femur Port, Min 2 Views Right  Result Date: 11/20/2017 CLINICAL DATA:  Status post inter medullary nail and   screw placement for impending right femur fracture due to metastatic disease. Multiple myeloma. EXAM: RIGHT FEMUR PORTABLE 2 VIEW; DG C-ARM 61-120 MIN COMPARISON:  CT scan of the femurs of November 17, 2017 FINDINGS: Fluoro time reported is 1 minutes, 24 seconds. Two fluoro spot images are observed. An intramedullary rod within the femoral shaft present. There are 2 telescoping screws in the right femoral neck and head and securing screw in the lower right femoral shaft. There is no immediate postprocedure complication. IMPRESSION: No immediate postprocedure complication following intramedullary nail placement of the right femur. Electronically Signed   By: David  Jordan M.D.   On: 11/20/2017 13:41   Us Abdomen Limited Ruq  Result Date: 11/29/2017 CLINICAL DATA:  Elevated LFTs, pericholecystic fluid by CT, history hypertension, hepatitis-C EXAM: ULTRASOUND ABDOMEN LIMITED RIGHT UPPER  QUADRANT COMPARISON:  CT abdomen and pelvis 11/25/2017 FINDINGS: Gallbladder: Thickened gallbladder wall or pericholecystic fluid. No gallstones or sonographic Murphy sign identified. Common bile duct: Diameter: 4 mm diameter , normal Liver: No focal lesion identified. Within normal limits in parenchymal echogenicity. Portal vein is patent on color Doppler imaging with normal direction of blood flow towards the liver. No RIGHT upper quadrant free fluid Incidentally noted increased parenchymal echogenicity of the RIGHT kidney suggesting medical renal disease. IMPRESSION: Thickened gallbladder wall and pericholecystic fluid without definite sonographic Murphy sign or gallstones ; findings are highly suspicious for cholecystitis. Question medical disease changes of RIGHT kidney. Electronically Signed   By: Mark  Boles M.D.   On: 11/29/2017 13:46    ASSESSMENT & PLAN:   61 y.o. AA male presenting with severe hypercalcemia, skeletal findings consistent with myeloma and significant leukocytosis/lymphocytosis in the peripheral bloodas well as AKI and hypercalcemia.  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 Appears to be responding to treatment.- with decreased kappa light chains.  Prelim- BM Bx showed >95% involvement with kappa restricted serum free light chains. 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  Lymphocytosis/leukocytosis resolved with high-dose steroids and Velcade. 2) s/p Prophylactic IM nailing for rt femur. 3) Thrombocytopenia improved. 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 -continue Acyclovir prophylaxis. -will setup for PRBC  transfusion early next week. -Home health care referral for PT - since he is still having difficulty with ambulation. --Please schedule 2nd cycle of treatment from 12/18/2017 -nephrology referral to Dr Coladonato /[facility] in 1-2 weeks for continued treatment -Orthopedics referral to Jason Rogers for post-operative followup and staples removal -weekly labs -PRBC transfusion in 3-4 days 2 units Referral for PCP -[facility] health [facility] wellness clinic -RTC with Dr Kale C2D8 of treatment  4) h/o tobacco abuse-counseled on smoking cessation -given prescription for nicotine patch  5) h/o cocaine and ETOH abuse -- sober for 3 yrs  6) AKI vs ARF on CKD -- due to myeloma.  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 Some drop in hgb due to rt gluteal hematoma Plan hgb at 8.2 -will setup for PRBC transfusion to allow for adequate treatment dosing.  10) Hyperkalemia due to some TLS + renal insufficiency K stable   K 5.2 -monitoring  11) Elevated aPTT nl fibrinogen -likely coagulopathy due to paraproteinemia. -rx myeloma Plan -rx myeloma.  8) newly diagnosed Hep C -will need Hep C clinic referral on discharge -LFT monitoring with chemotherapy -will need PCP  9) Thrombocytopenia due to Velcade--- resolved.   -Please schedule 2nd cycle of treatment from 12/18/2017 -nephrology referral to Dr Coladonato /[facility] in 1-2 weeks for continued treatment -Orthopedics referral to Jason Rogers for post-operative followup and staples removal -weekly labs -PRBC transfusion in 3-4 days 2 units Referral for PCP -Community health and wellness clinic -RTC with Dr Kale C2D8 of treatment   I spent 30 minutes counseling the patient face to face. The total time spent in  the appointment was 40 minutes and more than 50% was on counseling and direct patient cares.    Gautam Kale MD MS AAHIVMS [facility] Hematology/Oncology Physician [facility]  (Office):       [phone] (Work cell):  [phone] (Fax):           [phone]  This document serves as a record of services personally performed by Gautam Kale, MD. It was created on his behalf by Lana Nabulsi, a trained medical scribe. The creation of this record is based on the scribe's personal observations and the provider's statements to them.   .I have reviewed the above documentation for accuracy and completeness, and I agree with the above. .Gautam Kishore Kale MD MS   

## 2017-12-14 NOTE — Patient Instructions (Signed)
Thank you for choosing [facility] to provide your oncology and hematology care.  To afford each patient quality time with our providers, please arrive 30 minutes before your scheduled appointment time.  If you arrive late for your appointment, you may be asked to reschedule.  We strive to give you quality time with our providers, and arriving late affects you and other patients whose appointments are after yours.  If you are a no show for multiple scheduled visits, you may be dismissed from the clinic at the providers discretion.   Again, thank you for choosing [facility], our hope is that these requests will decrease the amount of time that you wait before being seen by our physicians.  ______________________________________________________________________ Should you have questions after your visit to the  Cancer Center, please contact our office at [phone] between the hours of 8:30 and 4:30 p.m.    Voicemails left after 4:30p.m will not be returned until the following business day.   For prescription refill requests, please have your pharmacy contact us directly.  Please also try to allow 48 hours for prescription requests.   Please contact the scheduling department for questions regarding scheduling.  For scheduling of procedures such as PET scans, CT scans, MRI, Ultrasound, etc please contact central scheduling at [phone].   Resources For Cancer Patients and Caregivers:  American Cancer Society:  800-227-2345  Can help patients locate various types of support and financial assistance Cancer Care: 1-800-813-HOPE (4673) Provides financial assistance, online support groups, medication/co-pay assistance.   [place] DSS:  336-641-3447 Where to apply for food stamps, Medicaid, and utility assistance Medicare Rights Center: 800-333-4114 Helps people with Medicare understand their rights and benefits, navigate the Medicare system, and secure the  quality healthcare they deserve SCAT: 336-333-6589 [place] [employer]'s shared-ride transportation service for eligible riders who have a disability that prevents them from riding the fixed route bus.   For additional information on assistance programs please contact our social worker:   Grier Hock/Abigail Elmore:  [phone] 

## 2017-12-19 ENCOUNTER — Telehealth: Payer: Self-pay | Admitting: Hematology

## 2017-12-19 ENCOUNTER — Telehealth: Payer: Self-pay | Admitting: *Deleted

## 2017-12-19 NOTE — Telephone Encounter (Signed)
Pt called stating "I saw Dr. Irene Limbo last week, but I'm not sure when to come back."  No appointments scheduled at this time.  1/4 LOS reviewed, pt to have chemo & PRBC scheduled this week. Pt informed that this RN would contact scheduling.  High priority message sent.

## 2017-12-19 NOTE — Addendum Note (Signed)
Addended by: Arty Baumgartner on: 12/19/2017 04:31 PM   Modules accepted: Orders

## 2017-12-19 NOTE — Telephone Encounter (Signed)
Spoke with patient re appointment for 1/10. Patient will get schedule at tomorrow's visit.   Spoke with Pam at [facility] and patient is currently scheduled for hosp f/u 1/24 and is aware. Also spoke with Marlowe Kays at [facility] and patient was seen for f/u today.   Patient to contact [facility] regarding establishing primary care

## 2017-12-20 ENCOUNTER — Inpatient Hospital Stay: Payer: BLUE CROSS/BLUE SHIELD

## 2017-12-20 ENCOUNTER — Other Ambulatory Visit: Payer: Self-pay | Admitting: *Deleted

## 2017-12-20 VITALS — BP 114/79 | HR 82 | Temp 98.6°F | Resp 16

## 2017-12-20 DIAGNOSIS — R109 Unspecified abdominal pain: Secondary | ICD-10-CM | POA: Diagnosis not present

## 2017-12-20 DIAGNOSIS — Z9889 Other specified postprocedural states: Secondary | ICD-10-CM | POA: Diagnosis not present

## 2017-12-20 DIAGNOSIS — C9010 Plasma cell leukemia not having achieved remission: Secondary | ICD-10-CM

## 2017-12-20 DIAGNOSIS — D649 Anemia, unspecified: Secondary | ICD-10-CM

## 2017-12-20 DIAGNOSIS — C9000 Multiple myeloma not having achieved remission: Secondary | ICD-10-CM

## 2017-12-20 DIAGNOSIS — D630 Anemia in neoplastic disease: Secondary | ICD-10-CM | POA: Diagnosis not present

## 2017-12-20 DIAGNOSIS — F1021 Alcohol dependence, in remission: Secondary | ICD-10-CM | POA: Diagnosis not present

## 2017-12-20 DIAGNOSIS — R5383 Other fatigue: Secondary | ICD-10-CM | POA: Diagnosis not present

## 2017-12-20 DIAGNOSIS — B1920 Unspecified viral hepatitis C without hepatic coma: Secondary | ICD-10-CM | POA: Diagnosis not present

## 2017-12-20 DIAGNOSIS — N289 Disorder of kidney and ureter, unspecified: Secondary | ICD-10-CM | POA: Diagnosis not present

## 2017-12-20 DIAGNOSIS — R682 Dry mouth, unspecified: Secondary | ICD-10-CM | POA: Diagnosis not present

## 2017-12-20 DIAGNOSIS — Z5111 Encounter for antineoplastic chemotherapy: Secondary | ICD-10-CM | POA: Diagnosis not present

## 2017-12-20 DIAGNOSIS — Z79899 Other long term (current) drug therapy: Secondary | ICD-10-CM | POA: Diagnosis not present

## 2017-12-20 DIAGNOSIS — N400 Enlarged prostate without lower urinary tract symptoms: Secondary | ICD-10-CM | POA: Diagnosis not present

## 2017-12-20 DIAGNOSIS — G8918 Other acute postprocedural pain: Secondary | ICD-10-CM | POA: Diagnosis not present

## 2017-12-20 DIAGNOSIS — F329 Major depressive disorder, single episode, unspecified: Secondary | ICD-10-CM | POA: Diagnosis not present

## 2017-12-20 DIAGNOSIS — R0602 Shortness of breath: Secondary | ICD-10-CM | POA: Diagnosis not present

## 2017-12-20 DIAGNOSIS — I10 Essential (primary) hypertension: Secondary | ICD-10-CM | POA: Diagnosis not present

## 2017-12-20 DIAGNOSIS — F17210 Nicotine dependence, cigarettes, uncomplicated: Secondary | ICD-10-CM | POA: Diagnosis not present

## 2017-12-20 DIAGNOSIS — R42 Dizziness and giddiness: Secondary | ICD-10-CM | POA: Diagnosis not present

## 2017-12-20 LAB — CBC WITH DIFFERENTIAL (CANCER CENTER ONLY)
Basophils Absolute: 0.1 10*3/uL (ref 0.0–0.1)
Basophils Relative: 1 %
Eosinophils Absolute: 0 10*3/uL (ref 0.0–0.5)
Eosinophils Relative: 0 %
HEMATOCRIT: 24.5 % — AB (ref 38.4–49.9)
Hemoglobin: 8.1 g/dL — ABNORMAL LOW (ref 13.0–17.1)
LYMPHS ABS: 2.5 10*3/uL (ref 0.9–3.3)
LYMPHS PCT: 21 %
MCH: 29.8 pg (ref 27.2–33.4)
MCHC: 33.1 g/dL (ref 32.0–36.0)
MCV: 90.1 fL (ref 79.3–98.0)
Monocytes Absolute: 1 10*3/uL — ABNORMAL HIGH (ref 0.1–0.9)
Monocytes Relative: 8 %
NEUTROS PCT: 70 %
Neutro Abs: 8.6 10*3/uL — ABNORMAL HIGH (ref 1.5–6.5)
Platelet Count: 471 10*3/uL — ABNORMAL HIGH (ref 140–400)
RBC: 2.72 MIL/uL — AB (ref 4.20–5.82)
RDW: 15.6 % (ref 11.0–15.6)
WBC: 12.1 10*3/uL — AB (ref 4.0–10.3)

## 2017-12-20 LAB — CMP (CANCER CENTER ONLY)
ALT: 18 U/L (ref 0–55)
ANION GAP: 9 (ref 3–11)
AST: 14 U/L (ref 5–34)
Albumin: 2.5 g/dL — ABNORMAL LOW (ref 3.5–5.0)
Alkaline Phosphatase: 105 U/L (ref 40–150)
BUN: 36 mg/dL — ABNORMAL HIGH (ref 7–26)
CHLORIDE: 110 mmol/L — AB (ref 98–109)
CO2: 21 mmol/L — ABNORMAL LOW (ref 22–29)
Calcium: 8.3 mg/dL — ABNORMAL LOW (ref 8.4–10.4)
Creatinine: 2.62 mg/dL — ABNORMAL HIGH (ref 0.70–1.30)
GFR, EST AFRICAN AMERICAN: 29 mL/min — AB (ref >60)
GFR, EST NON AFRICAN AMERICAN: 25 mL/min — AB (ref >60)
Glucose, Bld: 95 mg/dL (ref 70–140)
Potassium: 4.5 mmol/L (ref 3.5–5.1)
Sodium: 140 mmol/L (ref 136–145)
Total Bilirubin: 0.2 mg/dL (ref 0.2–1.2)
Total Protein: 5.7 g/dL — ABNORMAL LOW (ref 6.4–8.3)

## 2017-12-20 LAB — ABO/RH: ABO/RH(D): B POS

## 2017-12-20 LAB — PREPARE RBC (CROSSMATCH)

## 2017-12-20 MED ORDER — ACETAMINOPHEN 325 MG PO TABS
ORAL_TABLET | ORAL | Status: AC
  Filled 2017-12-20: qty 2

## 2017-12-20 MED ORDER — FUROSEMIDE 20 MG PO TABS
ORAL_TABLET | ORAL | Status: AC
Start: 2017-12-20 — End: 2017-12-20
  Filled 2017-12-20: qty 1

## 2017-12-20 MED ORDER — ACETAMINOPHEN 325 MG PO TABS
650.0000 mg | ORAL_TABLET | Freq: Once | ORAL | Status: AC
  Administered 2017-12-20: 650 mg via ORAL

## 2017-12-20 MED ORDER — FUROSEMIDE 10 MG/ML IJ SOLN
INTRAMUSCULAR | Status: AC
  Filled 2017-12-20: qty 2

## 2017-12-20 MED ORDER — ONDANSETRON HCL 8 MG PO TABS
ORAL_TABLET | ORAL | Status: AC
  Filled 2017-12-20: qty 1

## 2017-12-20 MED ORDER — SODIUM CHLORIDE 0.9 % IV SOLN
250.0000 mL | Freq: Once | INTRAVENOUS | Status: AC
  Administered 2017-12-20: 250 mL via INTRAVENOUS

## 2017-12-20 MED ORDER — SODIUM CHLORIDE 0.9% FLUSH
10.0000 mL | INTRAVENOUS | Status: DC | PRN
  Filled 2017-12-20: qty 10

## 2017-12-20 MED ORDER — ONDANSETRON HCL 8 MG PO TABS
4.0000 mg | ORAL_TABLET | Freq: Once | ORAL | Status: AC
  Administered 2017-12-20: 4 mg via ORAL

## 2017-12-20 MED ORDER — HEPARIN SOD (PORK) LOCK FLUSH 100 UNIT/ML IV SOLN
500.0000 [IU] | Freq: Every day | INTRAVENOUS | Status: DC | PRN
  Filled 2017-12-20: qty 5

## 2017-12-20 MED ORDER — FUROSEMIDE 10 MG/ML IJ SOLN
20.0000 mg | Freq: Once | INTRAMUSCULAR | Status: AC
  Administered 2017-12-20: 20 mg via INTRAVENOUS

## 2017-12-20 MED ORDER — BORTEZOMIB CHEMO SQ INJECTION 3.5 MG (2.5MG/ML)
1.3000 mg/m2 | Freq: Once | INTRAMUSCULAR | Status: AC
  Administered 2017-12-20: 2.25 mg via SUBCUTANEOUS
  Filled 2017-12-20: qty 2.25

## 2017-12-21 ENCOUNTER — Other Ambulatory Visit: Payer: Self-pay | Admitting: *Deleted

## 2017-12-21 ENCOUNTER — Other Ambulatory Visit: Payer: Self-pay | Admitting: Hematology

## 2017-12-21 DIAGNOSIS — C9010 Plasma cell leukemia not having achieved remission: Secondary | ICD-10-CM

## 2017-12-21 LAB — TYPE AND SCREEN
ABO/RH(D): B POS
ANTIBODY SCREEN: NEGATIVE
UNIT DIVISION: 0
UNIT DIVISION: 0

## 2017-12-21 LAB — BPAM RBC
BLOOD PRODUCT EXPIRATION DATE: 201902032359
Blood Product Expiration Date: 201902032359
ISSUE DATE / TIME: 201901101207
ISSUE DATE / TIME: 201901101207
UNIT TYPE AND RH: 7300
Unit Type and Rh: 7300

## 2017-12-21 NOTE — Progress Notes (Signed)
Andres Fernandez   HEMATOLOGY/ONCOLOGY OUTPATIENT PROGRESS NOTE  Date of Service: 12/24/17  CC: post hospitalization f/u for myeloma/plasma cell leukemia.  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 His labs today 12/14/2017 show hgb 8.3, platelets 524k. He is doing well overall. He has staples in his right upper leg following a recent surgery and has not been in touch with the surgeons for a f/u of this. He is not receiving physical therapy and is not getting around at home.  He notes that he was not scheduled for a post hospitalization nephrology f/u and has no PCP.   On review of systems, pt reports intermittent fatigue, SOB, pain to the left leg, dizziness, dry mouth and denies fever, chills, night sweats, dysuria and any other accompanying symptoms.   INTERVAL HISTORY:   Andres Fernandez presents today for follow up and C2D4 of treatment. He is doing well overall. He notes that he has had his staples removed from his right upper leg and he is doing well at this time. He states that he will soon start Physical Therapy once he has it configured with his insurance. He is able to get by with ambulating with his cane. He is not currently driving at this time. He drives a forklift for a living and voices that he would like to get back to work as soon as he can. He would like to get back to work by March. He has an appointment with his Nephrologist on 1/18.  He has started his 2nd cycle of treatment. hgb is up to 10.7 after 2 units of PRBCs Labs from today with pt Hgb at 10.7, HCT at 30.9, RBC at 3.52, RDW at 15.8, and PLT at 426. BUN at 37, Creatinine at 2.46, albumin at 2.6, and chloride at 112.     He reports that while in the hospital, he was depressed and thought "about the word suicide but it popped in and out of his mind like toast." He reports that he would like to speak with someone regarding his current mood. He denies having a plan  at this time. He denies having guns. He states "I love me, but this new diagnosis has changed my life and I just had one thought while in the hospital." He reports "I ain't going to get to the point, where I will hurt myself, you can believe that." He notes that if he got to a real depressive mood, he will check into the emergency department for evaluation.   He denies tingling or numbness in his feet, rashes, and decreased appetite. He consumes 2.5 meals per day. He has a prior hx of numbness to his right fingers. He reports that he intermittently wakes up lightheaded. He reports abdominal pain on palpation. He is taking tylenol following his recent procedure. He reports that he will start the nicotine patch tomorrow and that he has had one cigarette since his recent hospitalization.     NAD PHYSICAL EXAMINATION:  VS reviewed GENERAL:alert, in no acute distress and comfortable SKIN: no acute rashes, no significant lesions EYES: conjunctiva are pink and non-injected, sclera anicteric OROPHARYNX: MMM, no exudates, no oropharyngeal erythema or ulceration NECK: supple,  JVD mild we connected about that medication he gave me like that told him LYMPH:  no palpable lymphadenopathy in the cervical, axillary or inguinal regions LUNGS: no basal rales. HEART: regular rate & rhythm ABDOMEN:  normoactive bowel sounds , non  tender, not distended. Extremity: no pedal edema PSYCH: alert & oriented x 3 with fluent speech NEURO: no focal motor/sensory deficits   MEDICAL HISTORY:  Past Medical History:  Diagnosis Date  . BPH (benign prostatic hyperplasia)   . Hepatitis C 11/27/2017  . Hypertension   . Plasma cell leukemia ([place]) 11/23/2017  . Tobacco abuse     SURGICAL HISTORY: Past Surgical History:  Procedure Laterality Date  . APPENDECTOMY    . FEMUR IM NAIL Right 11/20/2017   Procedure: RIGHT FEMORAL INTRAMEDULLARY (IM) NAIL;  Surgeon: Nicholes Stairs, MD;  Location: [facility];  Service:  Orthopedics;  Laterality: Right;    SOCIAL HISTORY: Social History   Socioeconomic History  . Marital status: Single    Spouse name: Not on file  . Number of children: Not on file  . Years of education: Not on file  . Highest education level: Not on file  Social Needs  . Financial resource strain: Not on file  . Food insecurity - worry: Not on file  . Food insecurity - inability: Not on file  . Transportation needs - medical: Not on file  . Transportation needs - non-medical: Not on file  Occupational History  . Not on file  Tobacco Use  . Smoking status: Former Smoker    Packs/day: 0.50    Types: Cigarettes    Last attempt to quit: 11/09/2017    Years since quitting: 0.1  . Smokeless tobacco: Never Used  . Tobacco comment: no cigarettes x 1 week.  Substance and Sexual Activity  . Alcohol use: Yes  . Drug use: No  . Sexual activity: Not on file  Other Topics Concern  . Not on file  Social History Narrative  . Not on file    FAMILY HISTORY: Family History  Problem Relation Age of Onset  . COPD Mother   . Liver cancer Mother     ALLERGIES:  has No Known Allergies.  MEDICATIONS:  Scheduled Meds:  Continuous Infusions:  PRN Meds:.  REVIEW OF SYSTEMS:    10 Point review of Systems was done is negative except as noted above.   LABORATORY DATA:  I have reviewed the data as listed  . CBC Latest Ref Rng & Units 12/24/2017 12/20/2017 12/14/2017  WBC 4.0 - 10.3 10e3/uL - - 7.4  Hemoglobin 13.0 - 17.1 g/dL - - 8.3(L)  Hematocrit 38.4 - 49.9 % 30.9(L) 24.5(L) 23.9(L)  Platelets 140 - 400 10e3/uL - - 524(H)   . CBC    Component Value Date/Time   WBC 8.9 12/24/2017 1348   WBC 7.4 12/14/2017 1230   WBC 3.6 (L) 12/05/2017 1158   RBC 3.52 (L) 12/24/2017 1348   HGB 8.3 (L) 12/14/2017 1230   HCT 30.9 (L) 12/24/2017 1348   HCT 23.9 (L) 12/14/2017 1230   PLT 426 (H) 12/24/2017 1348   PLT 524 (H) 12/14/2017 1230   MCV 88.0 12/24/2017 1348   MCV 88.4 12/14/2017  1230   MCH 30.4 12/24/2017 1348   MCHC 34.5 12/24/2017 1348   RDW 15.8 (H) 12/24/2017 1348   RDW 15.3 (H) 12/14/2017 1230   LYMPHSABS 2.3 12/24/2017 1348   LYMPHSABS 1.4 12/14/2017 1230   MONOABS 0.6 12/24/2017 1348   MONOABS 0.6 12/14/2017 1230   EOSABS 0.0 12/24/2017 1348   EOSABS 0.1 12/14/2017 1230   BASOSABS 0.1 12/24/2017 1348   BASOSABS 0.1 12/14/2017 1230     . CMP Latest Ref Rng & Units 12/24/2017 12/20/2017 12/14/2017  Glucose 70 -  140 mg/dL 97 95 102  BUN 7 - 26 mg/dL 37(H) 36(H) 40.6(H)  Creatinine 0.7 - 1.3 mg/dL - - 3.3(HH)  Sodium 136 - 145 mmol/L 140 140 137  Potassium 3.5 - 5.1 mmol/L 4.0 4.5 5.2(H)  Chloride 98 - 109 mmol/L 112(H) 110(H) -  CO2 22 - 29 mmol/L 17(L) 21(L) 20(L)  Calcium 8.4 - 10.4 mg/dL 8.2(L) 8.3(L) 9.2  Total Protein 6.4 - 8.3 g/dL 6.0(L) 5.7(L) 6.1(L)  Total Bilirubin 0.2 - 1.2 mg/dL 0.2 0.2 0.35  Alkaline Phos 40 - 150 U/L 95 105 100  AST 5 - 34 U/L 28 14 15   ALT 0 - 55 U/L 20 18 21    Component     Latest Ref Rng & Units 12/24/2017  WBC     4.0 - 10.3 K/uL 8.9  RBC     4.20 - 5.82 MIL/uL 3.52 (L)  Hemoglobin     13.0 - 17.1 g/dL 10.7 (L)  HCT     38.4 - 49.9 % 30.9 (L)  MCV     79.3 - 98.0 fL 88.0  MCH     27.2 - 33.4 pg 30.4  MCHC     32.0 - 36.0 g/dL 34.5  RDW     11.0 - 15.6 % 15.8 (H)  Platelets     140 - 400 K/uL 426 (H)  Neutrophils     % 66  NEUT#     1.5 - 6.5 K/uL 6.0  Lymphocytes     % 26  Lymphocyte #     0.9 - 3.3 K/uL 2.3  Monocytes Relative     % 7  Monocyte #     0.1 - 0.9 K/uL 0.6  Eosinophil     % 0  Eosinophils Absolute     0.0 - 0.5 K/uL 0.0  Basophil     % 1  Basophils Absolute     0.0 - 0.1 K/uL 0.1    Component     Latest Ref Rng & Units 11/17/2017  Prostatic Specific Antigen     0.00 - 4.00 ng/mL 0.83  HIV Screen 4th Generation wRfx     Non Reactive Non Reactive  TSH     0.350 - 4.500 uIU/mL 1.874  Beta-2 Microglobulin     0.6 - 2.4 mg/L 25.7 (H)              RADIOGRAPHIC STUDIES: I have personally reviewed the radiological images as listed and agreed with the findings in the report. Ct Abdomen Pelvis Wo Contrast  Result Date: 11/25/2017 CLINICAL DATA:  Fever of unknown origin EXAM: CT CHEST, ABDOMEN AND PELVIS WITHOUT CONTRAST TECHNIQUE: Multidetector CT imaging of the chest, abdomen and pelvis was performed following the standard protocol without IV contrast. COMPARISON:  Left lower extremity CT performed 11/17/2017. FINDINGS: CT CHEST FINDINGS Cardiovascular: Heart is borderline enlarged. Aorta is normal caliber with scattered aortic calcifications. Mediastinum/Nodes: No mediastinal, hilar, or axillary adenopathy. Lungs/Pleura: Severe centrilobular and paraseptal emphysema. Trace left pleural effusion. No confluent airspace opacities. Musculoskeletal: No acute bony abnormality. CT ABDOMEN PELVIS FINDINGS Hepatobiliary: Pericholecystic fluid present. No visible focal hepatic abnormality. Pancreas: No focal abnormality or ductal dilatation. Spleen: No focal abnormality. Normal size. Small calcification in the superior spleen. Adrenals/Urinary Tract: No adrenal abnormality. No focal renal abnormality. No stones or hydronephrosis. Urinary bladder is unremarkable. Stomach/Bowel: Stomach, large and small bowel grossly unremarkable. Moderate stool burden in the colon. Vascular/Lymphatic: No evidence of aneurysm or adenopathy. Aortic and iliac calcifications. Reproductive: No  visible focal abnormality. Other: No free fluid or free air. Musculoskeletal: Stranding noted within the subcutaneous soft tissues overlying the right lateral buttocks. There is heterogeneous material within the right gluteus muscles. These findings are likely related to recent hip surgery. Lytic destructive process noted within the left iliac bone on image 86 measuring up to 2.6 cm, unchanged since prior left lower extremity CT. IMPRESSION: Pericholecystic fluid noted.  If there is clinical concern for cholecystitis, this could be further evaluated with ultrasound. Lytic destructive lesion within the left lateral iliac bone measuring up to 2.6 cm. It is most likely reflects an area of multiple myeloma or metastasis. Intramuscular hematoma within the right gluteus muscles with subcutaneous stranding, compatible with recent surgery. Electronically Signed   By: Rolm Baptise M.D.   On: 11/25/2017 14:28   Ct Chest Wo Contrast  Result Date: 11/25/2017 CLINICAL DATA:  Fever of unknown origin EXAM: CT CHEST, ABDOMEN AND PELVIS WITHOUT CONTRAST TECHNIQUE: Multidetector CT imaging of the chest, abdomen and pelvis was performed following the standard protocol without IV contrast. COMPARISON:  Left lower extremity CT performed 11/17/2017. FINDINGS: CT CHEST FINDINGS Cardiovascular: Heart is borderline enlarged. Aorta is normal caliber with scattered aortic calcifications. Mediastinum/Nodes: No mediastinal, hilar, or axillary adenopathy. Lungs/Pleura: Severe centrilobular and paraseptal emphysema. Trace left pleural effusion. No confluent airspace opacities. Musculoskeletal: No acute bony abnormality. CT ABDOMEN PELVIS FINDINGS Hepatobiliary: Pericholecystic fluid present. No visible focal hepatic abnormality. Pancreas: No focal abnormality or ductal dilatation. Spleen: No focal abnormality. Normal size. Small calcification in the superior spleen. Adrenals/Urinary Tract: No adrenal abnormality. No focal renal abnormality. No stones or hydronephrosis. Urinary bladder is unremarkable. Stomach/Bowel: Stomach, large and small bowel grossly unremarkable. Moderate stool burden in the colon. Vascular/Lymphatic: No evidence of aneurysm or adenopathy. Aortic and iliac calcifications. Reproductive: No visible focal abnormality. Other: No free fluid or free air. Musculoskeletal: Stranding noted within the subcutaneous soft tissues overlying the right lateral buttocks. There is heterogeneous material  within the right gluteus muscles. These findings are likely related to recent hip surgery. Lytic destructive process noted within the left iliac bone on image 86 measuring up to 2.6 cm, unchanged since prior left lower extremity CT. IMPRESSION: Pericholecystic fluid noted. If there is clinical concern for cholecystitis, this could be further evaluated with ultrasound. Lytic destructive lesion within the left lateral iliac bone measuring up to 2.6 cm. It is most likely reflects an area of multiple myeloma or metastasis. Intramuscular hematoma within the right gluteus muscles with subcutaneous stranding, compatible with recent surgery. Electronically Signed   By: Rolm Baptise M.D.   On: 11/25/2017 14:28   Dg Chest Port 1 View  Result Date: 11/30/2017 CLINICAL DATA:  Short of breath. EXAM: PORTABLE CHEST 1 VIEW COMPARISON:  11/26/2017 FINDINGS: Cardiac silhouette is top-normal in size. No mediastinal or hilar masses. No evidence of adenopathy. There are thickened bronchovascular and interstitial markings, similar to the prior study. These are increased when compared to a chest radiograph dated 04/13/2015. There is no focal lung consolidation to suggest pneumonia. Probable small pleural effusions. No pneumothorax. Lytic lesion erodes the lateral left eighth rib, unchanged from the recent exam. There are also numerous small lucent bone lesions throughout the visualized skeleton. 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 IMPRESSION: 1. Similar appearance to the prior chest radiograph. Prominent bronchovascular markings and mild interstitial thickening suggests mild interstitial edema. No convincing pneumonia. Electronically Signed   By: Lajean Manes M.D.   On: 11/30/2017  15:06   Dg Chest Port 1 View  Result Date: 11/26/2017 CLINICAL DATA:  62 year old male with shortness of breath and bibasilar crackles EXAM: PORTABLE CHEST 1 VIEW COMPARISON:  11/25/2017 CT,  11/17/2017 chest radiograph and prior studies. FINDINGS: Upper limits normal heart size noted. Pulmonary vascular congestion and interstitial opacities are present likely representing interstitial edema. Increased opacity at the right lung base may represent atelectasis, focal edema or possibly superimposed pneumonia. A lytic lesion within the anterolateral left eighth rib is noted. IMPRESSION: 1. Pulmonary vascular congestion and probable interstitial pulmonary edema. 2. Right basilar opacity which may represent atelectasis, focal edema or possibly superimposed pneumonia. 3. Left eighth rib lytic lesion -question multiple myeloma or metastatic disease. Electronically Signed   By: Margarette Canada M.D.   On: 11/26/2017 11:08   US Abdomen Limited Ruq  Result Date: 11/29/2017 CLINICAL DATA:  Elevated LFTs, pericholecystic fluid by CT, history hypertension, hepatitis-C EXAM: ULTRASOUND ABDOMEN LIMITED RIGHT UPPER QUADRANT COMPARISON:  CT abdomen and pelvis 11/25/2017 FINDINGS: Gallbladder: Thickened gallbladder wall or pericholecystic fluid. No gallstones or sonographic Murphy sign identified. Common bile duct: Diameter: 4 mm diameter , normal Liver: No focal lesion identified. Within normal limits in parenchymal echogenicity. Portal vein is patent on color Doppler imaging with normal direction of blood flow towards the liver. No RIGHT upper quadrant free fluid Incidentally noted increased parenchymal echogenicity of the RIGHT kidney suggesting medical renal disease. IMPRESSION: Thickened gallbladder wall and pericholecystic fluid without definite sonographic Murphy sign or gallstones ; findings are highly suspicious for cholecystitis. Question medical disease changes of RIGHT kidney. Electronically Signed   By: Lavonia Dana M.D.   On: 11/29/2017 13:46    ASSESSMENT & PLAN:   62 y.o. AA male presenting with severe hypercalcemia, skeletal findings consistent with myeloma and significant leukocytosis/lymphocytosis  in the peripheral bloodas well as AKI and hypercalcemia.  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 Appears to be responding to treatment.- with decreased kappa light chains.  Prelim- BM Bx showed >95% involvement with kappa restricted serum free light chains. 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  -Patient will be given a referral to [facility] to initiate Physical Therapy.  -Advised the patient to remain hydrated with at least 48-60 ounces of water a day.   -Advised to start Cyclophosphamide once weekly with steroid use to treat myeloma more effectively.  -Advised that we will re-evaluate the patient returning to work by March on the next visit following Physical Therapy at the [facility].  -Will start patient on bone strengthening medication in the near future.  -Will refer the patient to a Hepatitis clinic for further evaluation of recent Hepatitis C diagnosis.  Labs from today with pt Hgb at 10.7, HCT at 30.9, RBC at 3.52, RDW at 15.8, and PLT at 426. BUN at 37, Creatinine at 2.46, albumin at 2.6, and chloride at 112.     Lymphocytosis/leukocytosis resolved with high-dose steroids and Velcade. 2) s/p Prophylactic IM nailing for rt femur. 3) Thrombocytopenia improved. 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 -continue Acyclovir prophylaxis. -maintain follow up with nephrology referral to Dr [facility]Marval Regal[facility] on 12/28/2017 for continued treatment -[facility] for further evaluation and PT and mobilization s/p IM nailing for rt femur.  -weekly labs on D1 AND D8 each  cycle -RTC with Dr Irene Limbo with C3D1 -Continue treatment as per orders Plan to [facility] with C3 q4weeks Planing to add Cyclophosphamide from C3 (ordered and prescription sent [facility] outpatient pharmacy) Referral to Behavioral health for depression Referral to cancer rehab for PT/OT for fatigue, muscle strengthening and balance. RTC with Dr Irene Limbo on C3D1 Labs weekly    4) h/o tobacco abuse-counseled on smoking cessation -given prescription for nicotine patch  5) h/o cocaine and ETOH abuse -- sober for 3 yrs  6) AKI vs ARF on CKD -- due to myeloma.  (Creatinine somewhat slightly lower today) - multifactorial MM/TLS/sepsis/antibiotics.Primary light chain nephrology .Plan -maintain follow up with nephrology referral to Dr Marval Regal [facility] on 12/28/2017 for continued treatment  7) Anemia due to Myeloma/Plasma cell leukemia. Some drop in hgb due to rt gluteal hematoma Plan hgb at 8.2 hgb at 8.1 as of 12/20/17 -hgb at 10.7 as of 12/24/17.  -will setup for PRBC transfusion tas needed to maintain hgb>9   10) Hyperkalemia due to some TLS + renal insufficiency K stable improved from 5.2 to 4 -monitoring  11) Elevated aPTT nl fibrinogen -likely coagulopathy due to paraproteinemia. -rx myeloma Plan -rx myeloma.  8) newly diagnosed Hep C -will need Hep C clinic referral after getting a PCP -LFT monitoring with chemotherapy -will need PCP  9) Thrombocytopenia due to Velcade--- resolved.  12. 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 -no defined suicidal or homicidal ideation at this time. -Offered to start the patient on antidepressants to which the patient declined at this time.     Continue treatment as per orders Plan to [facility] with C3 q4weeks Planing to add  Cyclophosphamide from C3 Referral to Behavioral health for depression Referral to cancer rehab for PT/OT for fatigue, muscle strengthening and balance. RTC with Dr Irene Limbo on C3D1 Labs weekly   I spent 30 minutes counseling the patient face to face. The total time spent in the appointment was 40 minutes and more than 50% was on counseling and direct patient cares.    Sullivan Lone MD [place] AAHIVMS [facility] Hematology/Oncology Physician [facility]  (Office):       [phone] (Work cell):  [phone] (Fax):           [phone]  This document serves as a record of services personally performed by Sullivan Lone, MD. It was created on his behalf by Steva Colder, a trained medical scribe. The creation of this record is based on the scribe's personal observations and the provider's statements to them.   .I have reviewed the above documentation for accuracy and completeness, and I agree with the above. Brunetta Genera MD MS

## 2017-12-24 ENCOUNTER — Telehealth: Payer: Self-pay

## 2017-12-24 ENCOUNTER — Inpatient Hospital Stay: Payer: BLUE CROSS/BLUE SHIELD | Admitting: Hematology

## 2017-12-24 ENCOUNTER — Inpatient Hospital Stay: Payer: BLUE CROSS/BLUE SHIELD

## 2017-12-24 ENCOUNTER — Inpatient Hospital Stay (HOSPITAL_BASED_OUTPATIENT_CLINIC_OR_DEPARTMENT_OTHER): Payer: BLUE CROSS/BLUE SHIELD | Admitting: Hematology

## 2017-12-24 ENCOUNTER — Other Ambulatory Visit: Payer: Self-pay

## 2017-12-24 ENCOUNTER — Encounter: Payer: Self-pay | Admitting: Hematology

## 2017-12-24 VITALS — BP 108/72 | HR 85 | Temp 97.8°F | Resp 24 | Ht 68.0 in | Wt 143.8 lb

## 2017-12-24 DIAGNOSIS — F17210 Nicotine dependence, cigarettes, uncomplicated: Secondary | ICD-10-CM

## 2017-12-24 DIAGNOSIS — C9010 Plasma cell leukemia not having achieved remission: Secondary | ICD-10-CM

## 2017-12-24 DIAGNOSIS — R42 Dizziness and giddiness: Secondary | ICD-10-CM

## 2017-12-24 DIAGNOSIS — C9000 Multiple myeloma not having achieved remission: Secondary | ICD-10-CM

## 2017-12-24 DIAGNOSIS — B1920 Unspecified viral hepatitis C without hepatic coma: Secondary | ICD-10-CM

## 2017-12-24 DIAGNOSIS — G8918 Other acute postprocedural pain: Secondary | ICD-10-CM

## 2017-12-24 DIAGNOSIS — R5383 Other fatigue: Secondary | ICD-10-CM | POA: Diagnosis not present

## 2017-12-24 DIAGNOSIS — R109 Unspecified abdominal pain: Secondary | ICD-10-CM

## 2017-12-24 DIAGNOSIS — Z79899 Other long term (current) drug therapy: Secondary | ICD-10-CM

## 2017-12-24 DIAGNOSIS — D630 Anemia in neoplastic disease: Secondary | ICD-10-CM

## 2017-12-24 DIAGNOSIS — D649 Anemia, unspecified: Secondary | ICD-10-CM

## 2017-12-24 DIAGNOSIS — F32A Depression, unspecified: Secondary | ICD-10-CM

## 2017-12-24 DIAGNOSIS — R0602 Shortness of breath: Secondary | ICD-10-CM | POA: Diagnosis not present

## 2017-12-24 DIAGNOSIS — N400 Enlarged prostate without lower urinary tract symptoms: Secondary | ICD-10-CM

## 2017-12-24 DIAGNOSIS — F329 Major depressive disorder, single episode, unspecified: Secondary | ICD-10-CM

## 2017-12-24 DIAGNOSIS — R682 Dry mouth, unspecified: Secondary | ICD-10-CM | POA: Diagnosis not present

## 2017-12-24 DIAGNOSIS — F1021 Alcohol dependence, in remission: Secondary | ICD-10-CM

## 2017-12-24 DIAGNOSIS — Z9889 Other specified postprocedural states: Secondary | ICD-10-CM

## 2017-12-24 DIAGNOSIS — I10 Essential (primary) hypertension: Secondary | ICD-10-CM

## 2017-12-24 DIAGNOSIS — N289 Disorder of kidney and ureter, unspecified: Secondary | ICD-10-CM

## 2017-12-24 LAB — CBC WITH DIFFERENTIAL (CANCER CENTER ONLY)
Basophils Absolute: 0.1 10*3/uL (ref 0.0–0.1)
Basophils Relative: 1 %
Eosinophils Absolute: 0 10*3/uL (ref 0.0–0.5)
Eosinophils Relative: 0 %
HEMATOCRIT: 30.9 % — AB (ref 38.4–49.9)
HEMOGLOBIN: 10.7 g/dL — AB (ref 13.0–17.1)
LYMPHS ABS: 2.3 10*3/uL (ref 0.9–3.3)
LYMPHS PCT: 26 %
MCH: 30.4 pg (ref 27.2–33.4)
MCHC: 34.5 g/dL (ref 32.0–36.0)
MCV: 88 fL (ref 79.3–98.0)
MONOS PCT: 7 %
Monocytes Absolute: 0.6 10*3/uL (ref 0.1–0.9)
NEUTROS PCT: 66 %
Neutro Abs: 6 10*3/uL (ref 1.5–6.5)
Platelet Count: 426 10*3/uL — ABNORMAL HIGH (ref 140–400)
RBC: 3.52 MIL/uL — AB (ref 4.20–5.82)
RDW: 15.8 % — ABNORMAL HIGH (ref 11.0–15.6)
WBC: 8.9 10*3/uL (ref 4.0–10.3)

## 2017-12-24 LAB — CMP (CANCER CENTER ONLY)
ALK PHOS: 95 U/L (ref 40–150)
ALT: 20 U/L (ref 0–55)
ANION GAP: 11 (ref 3–11)
AST: 28 U/L (ref 5–34)
Albumin: 2.6 g/dL — ABNORMAL LOW (ref 3.5–5.0)
BILIRUBIN TOTAL: 0.2 mg/dL (ref 0.2–1.2)
BUN: 37 mg/dL — ABNORMAL HIGH (ref 7–26)
CALCIUM: 8.2 mg/dL — AB (ref 8.4–10.4)
CO2: 17 mmol/L — ABNORMAL LOW (ref 22–29)
Chloride: 112 mmol/L — ABNORMAL HIGH (ref 98–109)
Creatinine: 2.46 mg/dL — ABNORMAL HIGH (ref 0.70–1.30)
GFR, EST AFRICAN AMERICAN: 31 mL/min — AB (ref >60)
GFR, Estimated: 27 mL/min — ABNORMAL LOW (ref >60)
Glucose, Bld: 97 mg/dL (ref 70–140)
Potassium: 4 mmol/L (ref 3.5–5.1)
Sodium: 140 mmol/L (ref 136–145)
TOTAL PROTEIN: 6 g/dL — AB (ref 6.4–8.3)

## 2017-12-24 MED ORDER — PROCHLORPERAZINE MALEATE 10 MG PO TABS
ORAL_TABLET | ORAL | Status: AC
  Filled 2017-12-24: qty 1

## 2017-12-24 MED ORDER — BORTEZOMIB CHEMO SQ INJECTION 3.5 MG (2.5MG/ML)
1.3000 mg/m2 | Freq: Once | INTRAMUSCULAR | Status: AC
  Administered 2017-12-24: 2.25 mg via SUBCUTANEOUS
  Filled 2017-12-24: qty 2.25

## 2017-12-24 MED ORDER — ONDANSETRON HCL 8 MG PO TABS
4.0000 mg | ORAL_TABLET | Freq: Once | ORAL | Status: AC
  Administered 2017-12-24: 4 mg via ORAL

## 2017-12-24 MED ORDER — ONDANSETRON HCL 8 MG PO TABS
ORAL_TABLET | ORAL | Status: AC
  Filled 2017-12-24: qty 1

## 2017-12-24 MED ORDER — PROCHLORPERAZINE MALEATE 10 MG PO TABS
10.0000 mg | ORAL_TABLET | Freq: Once | ORAL | Status: AC
Start: 2017-12-24 — End: 2017-12-24
  Administered 2017-12-24: 10 mg via ORAL

## 2017-12-24 NOTE — Progress Notes (Signed)
Dr. Irene Limbo okay to tx with Ctn 2.46.

## 2017-12-24 NOTE — Telephone Encounter (Addendum)
Called [facility] at [phone] to place a referral for the pt to receive PT/OT for fatigue, muscle strengthening, and balance, r/t recent surgery and current diagnosis of plasma cell leukemia. Left VM with contact information for desk RN.   Additionally, spoke with Vanita Ingles at [facility]. Referral for depression. She will contact pt on mobile phone. Order to be placed by physician to [facility].

## 2017-12-24 NOTE — Patient Instructions (Signed)
[facility] Discharge Instructions for Patients Receiving Chemotherapy  Today you received the following chemotherapy agents Velcade.  To help prevent nausea and vomiting after your treatment, we encourage you to take your nausea medication as directed.  If you develop nausea and vomiting that is not controlled by your nausea medication, call the clinic.   BELOW ARE SYMPTOMS THAT SHOULD BE REPORTED IMMEDIATELY:  *FEVER GREATER THAN 100.5 F  *CHILLS WITH OR WITHOUT FEVER  NAUSEA AND VOMITING THAT IS NOT CONTROLLED WITH YOUR NAUSEA MEDICATION  *UNUSUAL SHORTNESS OF BREATH  *UNUSUAL BRUISING OR BLEEDING  TENDERNESS IN MOUTH AND THROAT WITH OR WITHOUT PRESENCE OF ULCERS  *URINARY PROBLEMS  *BOWEL PROBLEMS  UNUSUAL RASH Items with * indicate a potential emergency and should be followed up as soon as possible.  Feel free to call the clinic should you have any questions or concerns. The clinic phone number is [phone].  Please show the CHEMO ALERT CARD at check-in to the Emergency Department and triage nurse.   

## 2017-12-25 ENCOUNTER — Telehealth: Payer: Self-pay

## 2017-12-25 NOTE — Telephone Encounter (Signed)
Left VM with Ava, Referral Specialist, at [place] [phone]. Per MD, pt noted to have some signs of depression and suicidal ideation, but no plan at this time. Dr. Irene Limbo would like for pt to be seen by a psychiatrist. Additionally explained in VM that referral requested was not able to be placed in the system. Requested call back regarding order placement and referral confirmation.

## 2017-12-26 ENCOUNTER — Telehealth: Payer: Self-pay | Admitting: *Deleted

## 2017-12-26 DIAGNOSIS — C9010 Plasma cell leukemia not having achieved remission: Secondary | ICD-10-CM | POA: Insufficient documentation

## 2017-12-26 MED ORDER — CYCLOPHOSPHAMIDE 50 MG PO CAPS: 300.0000 mg/m2 | ORAL_CAPSULE | ORAL | 2 refills | Status: DC

## 2017-12-26 NOTE — Telephone Encounter (Signed)
"  I forgot when my appointment is for this month?"  Provided Jan. 18 2019 appointment information.  Asked for 30 minute early arrival for registration.  Denies further questions at this time.

## 2017-12-28 ENCOUNTER — Inpatient Hospital Stay: Payer: BLUE CROSS/BLUE SHIELD

## 2017-12-28 ENCOUNTER — Other Ambulatory Visit: Payer: Self-pay

## 2017-12-28 ENCOUNTER — Telehealth: Payer: Self-pay | Admitting: Pharmacist

## 2017-12-28 VITALS — BP 111/73 | HR 90 | Temp 98.3°F | Resp 18

## 2017-12-28 DIAGNOSIS — C9010 Plasma cell leukemia not having achieved remission: Secondary | ICD-10-CM

## 2017-12-28 DIAGNOSIS — C9000 Multiple myeloma not having achieved remission: Secondary | ICD-10-CM

## 2017-12-28 LAB — CMP (CANCER CENTER ONLY)
ALK PHOS: 98 U/L (ref 40–150)
ALT: 22 U/L (ref 0–55)
AST: 14 U/L (ref 5–34)
Albumin: 2.5 g/dL — ABNORMAL LOW (ref 3.5–5.0)
Anion gap: 11 (ref 3–11)
BILIRUBIN TOTAL: 0.3 mg/dL (ref 0.2–1.2)
BUN: 32 mg/dL — ABNORMAL HIGH (ref 7–26)
CALCIUM: 8.1 mg/dL — AB (ref 8.4–10.4)
CO2: 18 mmol/L — ABNORMAL LOW (ref 22–29)
CREATININE: 2.3 mg/dL — AB (ref 0.70–1.30)
Chloride: 109 mmol/L (ref 98–109)
GFR, EST AFRICAN AMERICAN: 34 mL/min — AB (ref >60)
GFR, Estimated: 29 mL/min — ABNORMAL LOW (ref >60)
Glucose, Bld: 141 mg/dL — ABNORMAL HIGH (ref 70–140)
Potassium: 4.2 mmol/L (ref 3.5–5.1)
Sodium: 138 mmol/L (ref 136–145)
TOTAL PROTEIN: 5.7 g/dL — AB (ref 6.4–8.3)

## 2017-12-28 LAB — CBC WITH DIFFERENTIAL (CANCER CENTER ONLY)
BASOS ABS: 0.1 10*3/uL (ref 0.0–0.1)
BASOS PCT: 1 %
EOS ABS: 0.1 10*3/uL (ref 0.0–0.5)
Eosinophils Relative: 0 %
HEMATOCRIT: 29.5 % — AB (ref 38.4–49.9)
HEMOGLOBIN: 9.8 g/dL — AB (ref 13.0–17.1)
Lymphocytes Relative: 24 %
Lymphs Abs: 2.8 10*3/uL (ref 0.9–3.3)
MCH: 29.6 pg (ref 27.2–33.4)
MCHC: 33 g/dL (ref 32.0–36.0)
MCV: 89.7 fL (ref 79.3–98.0)
MONO ABS: 1 10*3/uL — AB (ref 0.1–0.9)
Monocytes Relative: 9 %
NEUTROS ABS: 7.8 10*3/uL — AB (ref 1.5–6.5)
NEUTROS PCT: 66 %
Platelet Count: 336 10*3/uL (ref 140–400)
RBC: 3.29 MIL/uL — ABNORMAL LOW (ref 4.20–5.82)
RDW: 15.8 % — AB (ref 11.0–15.6)
WBC Count: 11.7 10*3/uL — ABNORMAL HIGH (ref 4.0–10.3)

## 2017-12-28 MED ORDER — ONDANSETRON HCL 8 MG PO TABS
ORAL_TABLET | ORAL | Status: AC
  Filled 2017-12-28: qty 1

## 2017-12-28 MED ORDER — CYCLOPHOSPHAMIDE 50 MG PO CAPS: ORAL_CAPSULE | ORAL | 2 refills | Status: DC

## 2017-12-28 MED ORDER — ONDANSETRON HCL 8 MG PO TABS
4.0000 mg | ORAL_TABLET | Freq: Once | ORAL | Status: AC
  Administered 2017-12-28: 4 mg via ORAL

## 2017-12-28 MED ORDER — BORTEZOMIB CHEMO SQ INJECTION 3.5 MG (2.5MG/ML)
1.3000 mg/m2 | Freq: Once | INTRAMUSCULAR | Status: AC
  Administered 2017-12-28: 2.25 mg via SUBCUTANEOUS
  Filled 2017-12-28: qty 2.25

## 2017-12-28 NOTE — Telephone Encounter (Signed)
Oral Chemotherapy Pharmacist Encounter   I spoke with patient for overview of: cyclophosphamide.   Pt is doing well. Counseled patient on administration, dosing, side effects, monitoring, drug-food interactions, safe handling, storage, and disposal.  Patient will take cyclophosphamide 50mg  capsules, 11 capsules (550mg ) by mouth once weekly. Patient will take cyclophosphamide with food, and will take it early in the day and maintain adequate hydration throughout the day.  Cyclophosphamide start date: 01/11/18 (start of cycle 3)  Side effects include but not limited to: decreased blood counts, nausea, vomiting, diarrhea, hair loss, and hemorrhagic cystitis.    Reviewed with patient importance of keeping a medication schedule and plan for any missed doses.  Mr/ McCrokle voiced understanding and appreciation.   All questions answered. Medication reconciliation performed and medication/allergy list updated.  Patient updated about dispensing pharmacy and provided phone number to [facility] [phone])  Patient knows to call the office with questions or concerns. Oral Oncology Clinic will continue to follow.  Johny Drilling, PharmD, BCPS, BCOP 12/28/2017 2:31 PM Oral Oncology Clinic [phone]

## 2017-12-28 NOTE — Progress Notes (Signed)
Dr. Irene Limbo reviewed all lab results today.  OK to treat per Dr. Irene Limbo.

## 2017-12-28 NOTE — Progress Notes (Signed)
Pt questioned about date and time for nephrology with Dr. Marval Regal. Called [employer], and secretary told this RN pt appt scheduled for 01/03/18 at 0830. Infusion nurse sharing information with pt.

## 2017-12-28 NOTE — Telephone Encounter (Signed)
Oral Oncology Pharmacist Encounter  Received new prescription for cyclophosphamide for the treatment of plasma cell leukemia / multiple myeloma in conjunction with Velcade and dexamethasone, planned duration to be assessed based on disease response.  Labs from 12/28/17 assessed, OK for treatment. Noted SCr=2.5, est CrCl~30, no dose adjustments needed for renal dysfunction.  Current medication list in Epic reviewed, no significant DDIs with cyclophosphamide identified.  Prescription has been e-scribed to [place] for benefits analysis and approval per insurance requirement.  Oral Oncology Clinic will continue to follow for insurance authorization, copayment issues, initial counseling and start date.  Johny Drilling, PharmD, BCPS, BCOP 12/28/2017 1:59 PM Oral Oncology Clinic [phone]

## 2017-12-28 NOTE — Patient Instructions (Signed)
[facility] Discharge Instructions for Patients Receiving Chemotherapy  Today you received the following chemotherapy agents :  Velcade.  To help prevent nausea and vomiting after your treatment, we encourage you to take your nausea medication as prescribed.  APPT  WITH  [facility]  ON   Thursday   Jan 03, 2018  AT  0830  AM.    If you develop nausea and vomiting that is not controlled by your nausea medication, call the clinic.   BELOW ARE SYMPTOMS THAT SHOULD BE REPORTED IMMEDIATELY:  *FEVER GREATER THAN 100.5 F  *CHILLS WITH OR WITHOUT FEVER  NAUSEA AND VOMITING THAT IS NOT CONTROLLED WITH YOUR NAUSEA MEDICATION  *UNUSUAL SHORTNESS OF BREATH  *UNUSUAL BRUISING OR BLEEDING  TENDERNESS IN MOUTH AND THROAT WITH OR WITHOUT PRESENCE OF ULCERS  *URINARY PROBLEMS  *BOWEL PROBLEMS  UNUSUAL RASH Items with * indicate a potential emergency and should be followed up as soon as possible.  Feel free to call the clinic should you have any questions or concerns. The clinic phone number is [phone].  Please show the [place] at check-in to the Emergency Department and triage nurse.

## 2017-12-31 ENCOUNTER — Inpatient Hospital Stay: Payer: BLUE CROSS/BLUE SHIELD

## 2017-12-31 VITALS — BP 107/55 | HR 89 | Temp 97.8°F | Resp 18

## 2017-12-31 DIAGNOSIS — C9010 Plasma cell leukemia not having achieved remission: Secondary | ICD-10-CM

## 2017-12-31 MED ORDER — ONDANSETRON HCL 8 MG PO TABS
ORAL_TABLET | ORAL | Status: AC
Start: 2017-12-31 — End: 2017-12-31
  Filled 2017-12-31: qty 1

## 2017-12-31 MED ORDER — ONDANSETRON HCL 8 MG PO TABS
4.0000 mg | ORAL_TABLET | Freq: Once | ORAL | Status: AC
  Administered 2017-12-31: 4 mg via ORAL

## 2017-12-31 MED ORDER — PROCHLORPERAZINE MALEATE 10 MG PO TABS
ORAL_TABLET | ORAL | Status: AC
Start: 2017-12-31 — End: 2017-12-31
  Filled 2017-12-31: qty 1

## 2017-12-31 MED ORDER — PROCHLORPERAZINE MALEATE 10 MG PO TABS
10.0000 mg | ORAL_TABLET | Freq: Once | ORAL | Status: AC
  Administered 2017-12-31: 10 mg via ORAL

## 2017-12-31 MED ORDER — BORTEZOMIB CHEMO SQ INJECTION 3.5 MG (2.5MG/ML)
1.3000 mg/m2 | Freq: Once | INTRAMUSCULAR | Status: AC
  Administered 2017-12-31: 2.25 mg via SUBCUTANEOUS
  Filled 2017-12-31: qty 2.25

## 2017-12-31 NOTE — Patient Instructions (Signed)
[facility] Discharge Instructions for Patients Receiving Chemotherapy  Today you received the following chemotherapy agents Velcade.  To help prevent nausea and vomiting after your treatment, we encourage you to take your nausea medication as directed.  If you develop nausea and vomiting that is not controlled by your nausea medication, call the clinic.   BELOW ARE SYMPTOMS THAT SHOULD BE REPORTED IMMEDIATELY:  *FEVER GREATER THAN 100.5 F  *CHILLS WITH OR WITHOUT FEVER  NAUSEA AND VOMITING THAT IS NOT CONTROLLED WITH YOUR NAUSEA MEDICATION  *UNUSUAL SHORTNESS OF BREATH  *UNUSUAL BRUISING OR BLEEDING  TENDERNESS IN MOUTH AND THROAT WITH OR WITHOUT PRESENCE OF ULCERS  *URINARY PROBLEMS  *BOWEL PROBLEMS  UNUSUAL RASH Items with * indicate a potential emergency and should be followed up as soon as possible.  Feel free to call the clinic should you have any questions or concerns. The clinic phone number is [phone].  Please show the CHEMO ALERT CARD at check-in to the Emergency Department and triage nurse.   

## 2018-01-07 ENCOUNTER — Other Ambulatory Visit: Payer: Self-pay

## 2018-01-07 ENCOUNTER — Ambulatory Visit: Payer: BLUE CROSS/BLUE SHIELD | Attending: Hematology | Admitting: Physical Therapy

## 2018-01-07 ENCOUNTER — Encounter: Payer: Self-pay | Admitting: Physical Therapy

## 2018-01-07 DIAGNOSIS — M79651 Pain in right thigh: Secondary | ICD-10-CM | POA: Diagnosis present

## 2018-01-07 DIAGNOSIS — R262 Difficulty in walking, not elsewhere classified: Secondary | ICD-10-CM

## 2018-01-07 DIAGNOSIS — M6281 Muscle weakness (generalized): Secondary | ICD-10-CM | POA: Insufficient documentation

## 2018-01-07 NOTE — Therapy (Signed)
[place], [place], [address] Phone: [phone]   Fax:  [phone]  Physical Therapy Evaluation  Patient Details  Name: Andres Fernandez MRN: [id number] Date of Birth: 08/15/1956 Referring Provider: Irene Limbo   Encounter Date: 01/07/2018  PT End of Session - 01/07/18 1637    Visit Number  1    Number of Visits  17    Date for PT Re-Evaluation  03/04/18    PT Start Time  1302    PT Stop Time  1350    PT Time Calculation (min)  48 min    Activity Tolerance  Patient tolerated treatment well    Behavior During Therapy  [facility] for tasks assessed/performed       Past Medical History:  Diagnosis Date  . BPH (benign prostatic hyperplasia)   . Hepatitis C 11/27/2017  . Hypertension   . Plasma cell leukemia ([place]) 11/23/2017  . Tobacco abuse     Past Surgical History:  Procedure Laterality Date  . APPENDECTOMY    . FEMUR IM NAIL Right 11/20/2017   Procedure: RIGHT FEMORAL INTRAMEDULLARY (IM) NAIL;  Surgeon: Nicholes Stairs, MD;  Location: [facility];  Service: Orthopedics;  Laterality: Right;    There were no vitals filed for this visit.   Subjective Assessment - 01/07/18 1308    Subjective  I had a pipe put in my femur on Nov 21, 2017. I have multiple myeloma and it degraded the bone in my right femur. I did not use a cane before this surgery. I did not have any therapy at all since then. I was in the hospital from Dec 8 to Jan 2019.     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  Yes    Pain Score  6     Pain Location  Other (Comment) waist    Pain Orientation  Right;Left    Pain Descriptors / Indicators  Sharp    Pain Type  Surgical pain    Pain Onset  More than a month ago    Pain Frequency  Intermittent    Aggravating  Factors   standing up after sitting for an extended period of time    Pain Relieving Factors  standing for a prolonged period    Effect of Pain on Daily Activities  slows him down, has to stand for a bit before starting to walk         [facility] PT Assessment - 01/07/18 0001      Assessment   Medical Diagnosis  multiple myeloma    Referring Provider  Irene Limbo    Onset Date/Surgical Date  11/20/17    Hand Dominance  Right    Prior Therapy  none      Precautions   Precautions  None rod in femur- pt is not aware of precautions      Restrictions   Weight Bearing Restrictions  No      Balance Screen   Has the patient fallen in the past 6 months  No    Has the patient had a decrease in activity level because of a fear of falling?   Yes    Is the patient reluctant to leave their home because of a fear of falling?  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- pt used to operate a Forensic scientist     Leisure  pt walks but states his ankle swells on the R      Cognition   Overall Cognitive Status  Within Functional Limits for tasks assessed      Strength   Strength Assessment Site  Hip;Knee;Ankle    Right/Left Hip  Right;Left    Right Hip Flexion  3/5    Right Hip ABduction  3+/5 with pain in lateral hip    Right Hip ADduction  3+/5    Left Hip Flexion  5/5    Left Hip ABduction  4/5    Left Hip ADduction  3+/5    Right/Left Knee  Right;Left    Right Knee Flexion  3/5    Right Knee Extension  5/5    Left Knee Flexion  5/5     Left Knee Extension  5/5    Right/Left Ankle  Right;Left    Right Ankle Dorsiflexion  4/5    Left Ankle Dorsiflexion  5/5      Transfers   Transfers  Sit to Stand    Sit to Stand  With armrests;5: Supervision;With upper extremity assist      Ambulation/Gait   Ambulation Distance (Feet)  15 Feet    Assistive device  Straight cane    Gait Pattern  Decreased trunk rotation;Right circumduction;Left circumduction;Decreased hip/knee flexion - right;Decreased hip/knee flexion - left    Ambulation Surface  Level    Gait Comments  pt ambulates very stiffly with decreased hip and knee flexion, decreased trunk rotation due to increased pain around waist area that has been present since surgery      Standardized Balance Assessment   Standardized Balance Assessment  -- 30 sec sit to stand: 6 reps- with use of UEs      Timed Up and Go Test   Normal TUG (seconds)  17             Objective measurements completed on examination: See above findings.                PT Short Term Goals - 01/07/18 1653      PT SHORT TERM GOAL #1   Title  Pt to be able to stand from chair with no use of UEs to decrease fall risk    Time  4    Period  Weeks    Status  New    Target Date  02/04/18      PT SHORT TERM GOAL #2   Title  Pt to demonstrate 3+/5 right hip flexion to decrease fall risk    Baseline  3/5    Time  4    Period  Weeks    Status  New    Target Date  02/04/18  PT SHORT TERM GOAL #3   Title  Pt to be able to sit to stand from chair 8 times in 30 seconds to decrease fall risk    Baseline  6    Time  4    Period  Weeks    Status  New    Target Date  02/04/18        PT Long Term Goals - 01/07/18 1655      PT LONG TERM GOAL #1   Title  Pt to be independent in a home exercise program for continued strengthening and stretching    Time  8    Period  Weeks    Status  New    Target Date  03/04/18      PT LONG TERM GOAL #2   Title  Pt to demonstrate 4/5 right  hip flexion strength to decrease fall risk    Baseline  3/5    Time  8    Period  Weeks    Status  New    Target Date  03/04/18      PT LONG TERM GOAL #3   Title  Pt to demosntrate 4/5 right knee flexor strength to decrease risk of falls    Baseline  3/5    Time  4    Period  Weeks    Status  New    Target Date  03/04/18      PT LONG TERM GOAL #4   Title  Pt to be able to complete TUG in less than 30 seconds with no assistive device to decrease risk of falls    Baseline  17 seconds    Time  8    Period  Weeks    Status  New    Target Date  03/04/18      PT LONG TERM GOAL #5   Title  Pt to be able to complete 14 sit to stands in 30 seconds to decrease risk of falls without use of UEs for support    Baseline  6 with use of UEs    Time  8    Period  Weeks    Status  New    Target Date  03/04/18      Additional Long Term Goals   Additional Long Term Goals  Yes      PT LONG TERM GOAL #6   Title  Pt will be able to ambulate without use of cane to increase independence and allow pt to return to work    Baseline  pt uses straight cane    Time  8    Period  Weeks    Status  New    Target Date  03/04/18             Plan - 01/07/18 1637    Clinical Impression Statement  Pt presents to PT with increased fatigue, decreased endurance, decrease balance and RLE weakness following treatment for plasma cell leukemia with pathologic fracture of right femur with rod placed on 11/20/17. Pt states he spent a month in the hospital and become very weak during that time. He reports he has had no therapy after his surgery and his right LE is very weak. Pt wants to return to work in March 2019 but will need to build his endurance and strength before he is able to do this. He also reports that at some point he will have to have the same surgery on his left femur due to  degeneration in that bone. Pt is very unsteady when he first stands up and has to stand for a bit before he can take a step  due to pain across his waist that has been present since surgery. He is unable to stand from a chair without UE support. He is now ambulating with a cane which he was not using prior. Pt would also benefit from compression stockings to manage his ankle edema. He would also benefit from a toilet riser and tub bench to decrease his fall risk when he is in the bathroom. He has been using towel bars to pull up on. He is at an increased fall risk per his 30 sec sit to stand score and his TUG score. Pt would benefit from skilled PT services to increase strength, endurance, decrease fatigue and decrease reliance on assistive device.     History and Personal Factors relevant to plan of care:  pt has not had any PT since his surgery which was a month ago    Clinical Presentation  Evolving    Clinical Presentation due to:  will have to undergo surgery to place rod in left femur due to degeneration of this bone    Clinical Decision Making  Moderate    Rehab Potential  Good    Clinical Impairments Affecting Rehab Potential  pt has degeneration of left femur bone as well    PT Frequency  2x / week    PT Duration  8 weeks    PT Treatment/Interventions  ADLs/Self Care Home Management;Gait training;Therapeutic exercise;Therapeutic activities;Patient/family education;Balance training;Neuromuscular re-education;Manual techniques;Manual lymph drainage;Passive range of motion;Taping;Orthotic Fit/Training    PT Next Visit Plan  begin RLE strengthening exercises - supine and sitting, give stretching for hip flexors/trunk, see if pt got rx signed for garments, ask about tub bench and toilet riser    Recommended Other Services  note sent to Dr Irene Limbo recommending tub bench and toilet riser, and rx given to pt to get signed by Dr Irene Limbo for compression stockings    Consulted and Agree with Plan of Care  Patient       Patient will benefit from skilled therapeutic intervention in order to improve the following deficits and  impairments:  Decreased balance, Abnormal gait, Decreased endurance, Decreased mobility, Difficulty walking, Increased edema, Decreased activity tolerance, Decreased strength, Pain  Visit Diagnosis: Muscle weakness (generalized) - Plan: PT plan of care cert/re-cert  Pain in right thigh - Plan: PT plan of care cert/re-cert  Difficulty in walking, not elsewhere classified - Plan: PT plan of care cert/re-cert     Problem List Patient Active Problem List   Diagnosis Date Noted  . Plasma cell leukemia not having achieved remission ([place]) 12/26/2017  . Malnutrition of moderate degree 12/02/2017  . AKI (acute kidney injury) ([place]) 11/27/2017  . SIRS (systemic inflammatory response syndrome) ([place]) 11/27/2017  . Anemia 11/27/2017  . Thrombocytopenia ([place]) 11/27/2017  . Hepatitis C 11/27/2017  . Cancer associated pain   . Tumor lysis syndrome   . Encounter for antineoplastic chemotherapy   . Plasma cell leukemia ([place]) 11/23/2017  . Pathological fracture in neoplastic disease, right femur, initial encounter for fracture ([place]) 11/23/2017  . Lytic bone lesion of right femur 11/20/2017  . Acute renal failure ([place]) 11/17/2017  . ?? Multiple myeloma vs other bone marrow malignancy 11/17/2017  . Hypercalcemia 11/17/2017  . BPH (benign prostatic hyperplasia) 11/17/2017  . Tobacco abuse 11/17/2017  . Elevated blood-pressure reading without diagnosis of hypertension 11/17/2017  . Lytic  bone lesions on xray 11/17/2017    [facility] 01/07/2018, [place], [place], [address] Phone: [phone]   Fax:  [phone]  Name: Andres Fernandez MRN: [id number] Date of Birth: [birth year]  Manus Gunning, PT 01/07/18 5:02 PM

## 2018-01-09 ENCOUNTER — Ambulatory Visit: Payer: BLUE CROSS/BLUE SHIELD

## 2018-01-09 DIAGNOSIS — M6281 Muscle weakness (generalized): Secondary | ICD-10-CM | POA: Diagnosis not present

## 2018-01-09 DIAGNOSIS — M79651 Pain in right thigh: Secondary | ICD-10-CM

## 2018-01-09 DIAGNOSIS — R262 Difficulty in walking, not elsewhere classified: Secondary | ICD-10-CM

## 2018-01-09 NOTE — Therapy (Signed)
[place], [place], [address] Phone: [phone]   Fax:  [phone]  Physical Therapy Treatment  Patient Details  Name: Andres Fernandez MRN: [id number] Date of Birth: 07/05/1956 Referring Provider: Irene Limbo   Encounter Date: 01/09/2018  PT End of Session - 01/09/18 0935    Visit Number  2    Number of Visits  17    Date for PT Re-Evaluation  03/04/18    PT Start Time  0843    PT Stop Time  0933    PT Time Calculation (min)  50 min    Activity Tolerance  Patient tolerated treatment well    Behavior During Therapy  [facility] for tasks assessed/performed       Past Medical History:  Diagnosis Date  . BPH (benign prostatic hyperplasia)   . Hepatitis C 11/27/2017  . Hypertension   . Plasma cell leukemia ([place]) 11/23/2017  . Tobacco abuse     Past Surgical History:  Procedure Laterality Date  . APPENDECTOMY    . FEMUR IM NAIL Right 11/20/2017   Procedure: RIGHT FEMORAL INTRAMEDULLARY (IM) NAIL;  Surgeon: Nicholes Stairs, MD;  Location: [facility];  Service: Orthopedics;  Laterality: Right;    There were no vitals filed for this visit.  Subjective Assessment - 01/09/18 0846    Subjective  Nothing much new since I was here last. No pain right now while I'm sitting but when I get up it's about a 3 1/2 in my waist until I take a few steps.     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  No/denies                      [facility] Adult PT Treatment/Exercise - 01/09/18 0001      Knee/Hip Exercises: Stretches   Hip Flexor Stretch  Right;Left;3 reps;10 seconds lunge stretch at wall      Knee/Hip Exercises: Seated   Long Arc Quad  Strengthening;Right;Left;10 reps 3-5 sec holds    Marching  Strengthening;Right;Left;10  reps 3 sec holds      Knee/Hip Exercises: Supine   Bridges  Strengthening;Both;10 reps    Straight Leg Raises  Strengthening;Right;Left;2 sets;5 reps very challenging      Knee/Hip Exercises: Sidelying   Hip ABduction  Strengthening;Right;10 reps    Hip ABduction Limitations  Tactile and VCs for correct technique    Clams  Rt LE 10x    Other Sidelying Knee/Hip Exercises  Slightly rolled back onto glut (full Lt S/L caused inc Rt hip pain) for knee to chest 2 sets of 5      Knee/Hip Exercises: Prone   Hamstring Curl  10 reps;3 seconds Rt and Lt    Hip Extension  Strengthening;Right;Left;10 reps 3 sec holds, very challenging for pt and with limited ROM             PT Education - 01/09/18 0931    Education provided  Yes    Education Details  Bil hip strength and flexibility    Person(s) Educated  Patient    Methods  Explanation;Demonstration;Handout    Comprehension  Verbalized understanding;Returned demonstration;Need further instruction       PT Short Term Goals - 01/07/18 1653  PT SHORT TERM GOAL #1   Title  Pt to be able to stand from chair with no use of UEs to decrease fall risk    Time  4    Period  Weeks    Status  New    Target Date  02/04/18      PT SHORT TERM GOAL #2   Title  Pt to demonstrate 3+/5 right hip flexion to decrease fall risk    Baseline  3/5    Time  4    Period  Weeks    Status  New    Target Date  02/04/18      PT SHORT TERM GOAL #3   Title  Pt to be able to sit to stand from chair 8 times in 30 seconds to decrease fall risk    Baseline  6    Time  4    Period  Weeks    Status  New    Target Date  02/04/18        PT Long Term Goals - 01/07/18 1655      PT LONG TERM GOAL #1   Title  Pt to be independent in a home exercise program for continued strengthening and stretching    Time  8    Period  Weeks    Status  New    Target Date  03/04/18      PT LONG TERM GOAL #2   Title  Pt to demonstrate 4/5 right hip flexion strength  to decrease fall risk    Baseline  3/5    Time  8    Period  Weeks    Status  New    Target Date  03/04/18      PT LONG TERM GOAL #3   Title  Pt to demosntrate 4/5 right knee flexor strength to decrease risk of falls    Baseline  3/5    Time  4    Period  Weeks    Status  New    Target Date  03/04/18      PT LONG TERM GOAL #4   Title  Pt to be able to complete TUG in less than 30 seconds with no assistive device to decrease risk of falls    Baseline  17 seconds    Time  8    Period  Weeks    Status  New    Target Date  03/04/18      PT LONG TERM GOAL #5   Title  Pt to be able to complete 14 sit to stands in 30 seconds to decrease risk of falls without use of UEs for support    Baseline  6 with use of UEs    Time  8    Period  Weeks    Status  New    Target Date  03/04/18      Additional Long Term Goals   Additional Long Term Goals  Yes      PT LONG TERM GOAL #6   Title  Pt will be able to ambulate without use of cane to increase independence and allow pt to return to work    Baseline  pt uses straight cane    Time  8    Period  Weeks    Status  New    Target Date  03/04/18            Plan - 01/09/18 0936    Clinical Impression Statement  Pt tolerated first session of exercise very well though did have to modify one exercise due to some Rt hip pain. He was very pleased with the exercises and reported feeling LE fatigue at end of session but was pleased to know exercises that he could start doing at home to get his strength back.     Rehab Potential  Good    Clinical Impairments Affecting Rehab Potential  pt has degeneration of left femur bone as well    PT Frequency  2x / week    PT Duration  8 weeks    PT Treatment/Interventions  ADLs/Self Care Home Management;Gait training;Therapeutic exercise;Therapeutic activities;Patient/family education;Balance training;Neuromuscular re-education;Manual techniques;Manual lymph drainage;Passive range of  motion;Taping;Orthotic Fit/Training    PT Next Visit Plan  Cont RLE strengthening exercises reviewing HEP - supine and sitting, give stretching for hip flexors/trunk, see if pt got rx signed for garments, ask about tub bench and toilet riser    PT Home Exercise Plan  see HEP    Consulted and Agree with Plan of Care  Patient       Patient will benefit from skilled therapeutic intervention in order to improve the following deficits and impairments:  Decreased balance, Abnormal gait, Decreased endurance, Decreased mobility, Difficulty walking, Increased edema, Decreased activity tolerance, Decreased strength, Pain  Visit Diagnosis: Muscle weakness (generalized)  Pain in right thigh  Difficulty in walking, not elsewhere classified     Problem List Patient Active Problem List   Diagnosis Date Noted  . Plasma cell leukemia not having achieved remission ([place]) 12/26/2017  . Malnutrition of moderate degree 12/02/2017  . AKI (acute kidney injury) ([place]) 11/27/2017  . SIRS (systemic inflammatory response syndrome) ([place]) 11/27/2017  . Anemia 11/27/2017  . Thrombocytopenia ([place]) 11/27/2017  . Hepatitis C 11/27/2017  . Cancer associated pain   . Tumor lysis syndrome   . Encounter for antineoplastic chemotherapy   . Plasma cell leukemia ([place]) 11/23/2017  . Pathological fracture in neoplastic disease, right femur, initial encounter for fracture ([place]) 11/23/2017  . Lytic bone lesion of right femur 11/20/2017  . Acute renal failure ([place]) 11/17/2017  . ?? Multiple myeloma vs other bone marrow malignancy 11/17/2017  . Hypercalcemia 11/17/2017  . BPH (benign prostatic hyperplasia) 11/17/2017  . Tobacco abuse 11/17/2017  . Elevated blood-pressure reading without diagnosis of hypertension 11/17/2017  . Lytic bone lesions on xray 11/17/2017    Otelia Limes, PTA 01/09/2018, 9:38 AM  [place], [place],  [address] Phone: [phone]   Fax:  [phone]  Name: Andres Fernandez MRN: [id number] Date of Birth: [birth year]

## 2018-01-09 NOTE — Patient Instructions (Signed)
Hip Flexor Stretch: Lunge Pose    Facing wall, place hands on wall, keep chest up, bend front knee until stretch felt in back leg at hip. Hold for __10-20__ seconds. Repeat __3-5__ times each leg.  KNEE: Extension, Long Arc Quads - Sitting    Raise leg until knee is straight. _10__ reps per set, holding 5 sec, _2-3__ sets per day.  Seated Alternating Leg Raise (Marching)    Sit on chair. Raise bent knee and return. Repeat with other leg. Do __1-2_ sets of _10__ repetitions.  Bridging    Slowly raise buttocks from floor, keeping stomach tight. Repeat __1-2__ times per set. Do __10-20__ sets per session. Do __2-3__ sessions per day.   Straight Leg Raise    Bend one leg. Raise other leg as high as able keeping knee locked. Exhale and tighten thigh muscles while raising leg. Repeat with other leg. Repeat __2-3 sets of 2__ times. Do __2-3__ sessions per day.   HIP: Abduction - Side-Lying    Lie on side, bending bottom leg for support and top leg straight so it's in line with trunk. Squeeze butt. Raise top leg up and slightly back. Point toes forward. _5-10__ reps per set, _2-3__ sets per day.  Clam Shell 45 Degrees: Leg Lift    Lying with hips and knees bent 45, one pillow between knees and ankles. Rotate knee upward then lift leg. Lower foot then knee. Be sure pelvis does not roll backward. Do not arch back. Do _2-3 sets of 5-10__ times, each leg, _2-3__ times per day.  Then in this position roll slightly back onto butt, then try to bring knee to chest as if it was on a flat surface.   KNEE: Flexion - Prone    Bend knee. Raise heel toward buttocks. Do not raise hips. __10-20_ reps per set, _2-3__ sets per day.  Hip Extension: Prone    Tighten butt muscle. Lift one leg _10__ times, holding 3-5 sec. Restabilize pelvis. Repeat with other leg. Keep pelvis still. Be sure pelvis does not rotate and back does not arch. Do _1-2__ sets, _2-3__ times per  day.  Cancer Rehab [phone]

## 2018-01-10 ENCOUNTER — Other Ambulatory Visit: Payer: Self-pay

## 2018-01-10 DIAGNOSIS — C9010 Plasma cell leukemia not having achieved remission: Secondary | ICD-10-CM

## 2018-01-10 NOTE — Progress Notes (Signed)
Andres Fernandez   HEMATOLOGY/ONCOLOGY OUTPATIENT PROGRESS NOTE  Date of Service: 01/11/18  CC: post hospitalization f/u for myeloma/plasma cell leukemia.  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 His labs today 12/14/2017 show hgb 8.3, platelets 524k. He is doing well overall. He has staples in his right upper leg following a recent surgery and has not been in touch with the surgeons for a f/u of this. He is not receiving physical therapy and is not getting around at home.  He notes that he was not scheduled for a post hospitalization nephrology f/u and has no PCP.   On review of systems, pt reports intermittent fatigue, SOB, pain to the left leg, dizziness, dry mouth and denies fever, chills, night sweats, dysuria and any other accompanying symptoms.   INTERVAL HISTORY:   Andres Fernandez presents today for follow up and C3D1 of treatment. The patient's last visit with [name] was on 12/24/17. The pt reports that he is doing well overall. He saw his kidney doctor recently. He reports some rib pain bilaterally. He also reports that he cannot make a fist with his right hand, experiencing pain in his hand and in his wrist when he tries to make a fist. He also says he is experiencing discomfort in his right hand and forearm. He denies any injuries to this arm, or previous weakness. He also reports that his middle finger experiences numbness. He also notes that it has progressed since he has been in the hospital.  He reports going to PT and finds it to be both very helpful and enjoyable. 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  He reports decreasing his cigarette smoking to 4 q day but continues to wear his nicotine patch.   He reports that he has not yet picked up his cyclophosphamide do to confusion about which pharmacy he was going to.  Lab results today (01/11/18) of CBC, CMP, and Reticulocytes is as follows: all  values are WNL except for WBC at 10.4k, RBC at 3.23, Hgb at 9.6, HCT at 29.7, RDW at 16.5, Platelets at 523k, Monocytes Abs at 1.1, Chloride at 112, Creatinine at 1.92, Calcium at 8.3, Total Protein at 6.1, Albumin at 2.9.  Today 01/11/18 the MMP and Kappa/lambda are pending.   On review of systems, pt reports right ankle swelling after walking a lot but that it has gone down. He also reports gaining desired weight, eating well and denies neck pain, tingling in his toes and any other symptoms.    PHYSICAL EXAMINATION:  VS reviewed GENERAL:alert, in no acute distress and comfortable SKIN: no acute rashes, no significant lesions EYES: conjunctiva are pink and non-injected, sclera anicteric OROPHARYNX: MMM, no exudates, no oropharyngeal erythema or ulceration NECK: supple,  JVD mild we connected about that medication he gave me like that told him LYMPH:  no palpable lymphadenopathy in the cervical, axillary or inguinal regions LUNGS: no basal rales. HEART: regular rate & rhythm ABDOMEN:  normoactive bowel sounds , non tender, not distended. Extremity: no pedal edema PSYCH: alert & oriented x 3 with fluent speech NEURO: no focal motor/sensory deficits   MEDICAL HISTORY:  Past Medical History:  Diagnosis Date  . BPH (benign prostatic hyperplasia)   . Hepatitis C 11/27/2017  . Hypertension   . Plasma cell leukemia ([place]) 11/23/2017  . Tobacco abuse     SURGICAL HISTORY: Past Surgical History:  Procedure Laterality Date  .  APPENDECTOMY    . FEMUR IM NAIL Right 11/20/2017   Procedure: RIGHT FEMORAL INTRAMEDULLARY (IM) NAIL;  Surgeon: Nicholes Stairs, MD;  Location: [facility];  Service: Orthopedics;  Laterality: Right;    SOCIAL HISTORY: Social History   Socioeconomic History  . Marital status: Single    Spouse name: Not on file  . Number of children: Not on file  . Years of education: Not on file  . Highest education level: Not on file  Social Needs  . Financial resource  strain: Not on file  . Food insecurity - worry: Not on file  . Food insecurity - inability: Not on file  . Transportation needs - medical: Not on file  . Transportation needs - non-medical: Not on file  Occupational History  . Not on file  Tobacco Use  . Smoking status: Former Smoker    Packs/day: 0.50    Types: Cigarettes    Last attempt to quit: 11/09/2017    Years since quitting: 0.1  . Smokeless tobacco: Never Used  . Tobacco comment: no cigarettes x 1 week.  Substance and Sexual Activity  . Alcohol use: Yes  . Drug use: No  . Sexual activity: Not on file  Other Topics Concern  . Not on file  Social History Narrative  . Not on file    FAMILY HISTORY: Family History  Problem Relation Age of Onset  . COPD Mother   . Liver cancer Mother     ALLERGIES:  has No Known Allergies.  MEDICATIONS:  Scheduled Meds:  Continuous Infusions:  PRN Meds:.  REVIEW OF SYSTEMS:    10 Point review of Systems was done is negative except as noted above.   LABORATORY DATA:  I have reviewed the data as listed  . CBC Latest Ref Rng & Units 01/11/2018 12/28/2017 12/24/2017  WBC 4.0 - 10.3 K/uL 10.4(H) 11.7(H) 8.9  Hemoglobin 13.0 - 17.1 g/dL - - -  Hematocrit 38.4 - 49.9 % 29.7(L) 29.5(L) 30.9(L)  Platelets 140 - 400 K/uL 523(H) 336 426(H)  hgb 9.6 . CBC    Component Value Date/Time   WBC 11.7 (H) 12/28/2017 0906   WBC 7.4 12/14/2017 1230   WBC 3.6 (L) 12/05/2017 1158   RBC 3.29 (L) 12/28/2017 0906   HGB 8.3 (L) 12/14/2017 1230   HCT 29.5 (L) 12/28/2017 0906   HCT 23.9 (L) 12/14/2017 1230   PLT 336 12/28/2017 0906   PLT 524 (H) 12/14/2017 1230   MCV 89.7 12/28/2017 0906   MCV 88.4 12/14/2017 1230   MCH 29.6 12/28/2017 0906   MCHC 33.0 12/28/2017 0906   RDW 15.8 (H) 12/28/2017 0906   RDW 15.3 (H) 12/14/2017 1230   LYMPHSABS 2.8 12/28/2017 0906   LYMPHSABS 1.4 12/14/2017 1230   MONOABS 1.0 (H) 12/28/2017 0906   MONOABS 0.6 12/14/2017 1230   EOSABS 0.1 12/28/2017 0906     EOSABS 0.1 12/14/2017 1230   BASOSABS 0.1 12/28/2017 0906   BASOSABS 0.1 12/14/2017 1230     . CMP Latest Ref Rng & Units 01/11/2018 12/28/2017 12/24/2017  Glucose 70 - 140 mg/dL 98 141(H) 97  BUN 7 - 26 mg/dL 24 32(H) 37(H)  Creatinine 0.7 - 1.3 mg/dL - - -  Sodium 136 - 145 mmol/L 143 138 140  Potassium 3.5 - 5.1 mmol/L 4.6 4.2 4.0  Chloride 98 - 109 mmol/L 112(H) 109 112(H)  CO2 22 - 29 mmol/L 22 18(L) 17(L)  Calcium 8.4 - 10.4 mg/dL 8.3(L) 8.1(L) 8.2(L)  Total Protein  6.4 - 8.3 g/dL 6.1(L) 5.7(L) 6.0(L)  Total Bilirubin 0.2 - 1.2 mg/dL 0.3 0.3 0.2  Alkaline Phos 40 - 150 U/L 118 98 95  AST 5 - 34 U/L _0 ALT 0 - 55 U/L _1 Creatinine improved 1.96<--2.3   Component     Latest Ref Rng & Units 11/17/2017  Prostatic Specific Antigen     0.00 - 4.00 ng/mL 0.83  HIV Screen 4th Generation wRfx     Non Reactive Non Reactive  TSH     0.350 - 4.500 uIU/mL 1.874  Beta-2 Microglobulin     0.6 - 2.4 mg/L 25.7 (H)             RADIOGRAPHIC STUDIES: I have personally reviewed the radiological images as listed and agreed with the findings in the report. No results found.  ASSESSMENT & PLAN:   62 y.o. AA male presenting with severe hypercalcemia, skeletal findings consistent with myeloma and significant leukocytosis/lymphocytosis in the peripheral bloodas well as AKI and hypercalcemia.  1) Newly diagnosed Plasma Cell Leukemia/Multiple Myeloma with -Hypercalcemia -Renal Failure -Anemia -Extensive Bone lesions -Appears to be primarily Kappa Light chain Myeloma with no overt M spike. Appears to be responding to treatment.- with decreased kappa light chains. Prelim- BM Bx showed >95% involvement with kappa restricted serum free light chains. 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  PLAN  -Advised the patient to remain hydrated with at least 48-60 ounces of water a day.   -Advised to start Cyclophosphamide  376m/m2 weekly with Dexamethasone use to treat myeloma more effectively.  -Will refer the patient to a Hepatitis clinic for further evaluation of recent Hepatitis C diagnosis.  Lymphocytosis/leukocytosis resolved with high-dose steroids and Velcade. -continue Acyclovir prophylaxis. -weekly labs on D1 AND D8 each cycle -Continue treatment as per orders [facility] to use a condom if he engages in sexual activity because his semen contains chemotherapy and hepatitis C, and to let his partner know that he has hepatitis C.  2) s/p Prophylactic IM nailing for rt femur. -Advised that we will re-evaluate the patient returning to work by March on the [facility]  -patient reports improvement   3) Thrombocytopenia resolved  4)Renal insufficiency -- likely primarily related to Myeloma -Kidney numbers getting better today 01/11/18 with creatinine decreasing to 1.92 today 01/11/18 from 2.3 and 2.46 previously.   5) h/o tobacco abuse-counseled on smoking cessation -given prescription for nicotine patch  6) h/o cocaine and ETOH abuse -- sober for 3 yrs  7) Renal insuff- due to myeloma.  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 Some drop in hgb due to rt gluteal hematoma Plan hgb at 8.2 hgb at 8.1 as of 12/20/17 -hgb at 10.7 as of 12/24/17.  -Hgb at 9.6 as of 01/11/18 -will setup for PRBC transfusion as needed to maintain hgb>8  9) Hyperkalemia due to some TLS + renal insufficiency K stable improved from 5.2 to 4 -monitoring  10) Elevated aPTT nl fibrinogen -likely coagulopathy due to paraproteinemia. -rx myeloma Plan -rx myeloma.  164 newly diagnosed Hep C -will  give referral to Hep C clinic -LFT monitoring with chemotherapy -will need PCP  12) Thrombocytopenia due  to Velcade--- resolved.  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 -no defined suicidal or homicidal ideation at this time. -Offered to start the patient on antidepressants to which the patient declined at this time.   14) Possible tendinitis -Could have tendinitis or a tendon sheath infection, may need an [name] or an XR on his right arm to further identify the problem.  -Will refer to a hand surgeon to evaluate right hand.   X ray hand and forearm today [name] RUE today Continue Treatment as per orders [facility] with Dr Irene Limbo C3D15 in 2 weeks Labs D1 and D8 each cycle Referral to orthopedics for rt hand pain and tenderness  I spent 20 minutes counseling the patient face to face. The total time spent in the appointment was 25 minutes and more than 50% was on counseling and direct patient cares.    Sullivan Lone MD [place] AAHIVMS [facility] Hematology/Oncology Physician [facility]  (Office):       [phone] (Work cell):  [phone] (Fax):           [phone]  This document serves as a record of services personally performed by Sullivan Lone, MD. It was created on his behalf by Baldwin Jamaica, a trained medical scribe. The creation of this record is based on the scribe's personal observations and the provider's statements to them.   .I have reviewed the above documentation for accuracy and completeness, and I agree with the above. Brunetta Genera MD MS

## 2018-01-11 ENCOUNTER — Telehealth: Payer: Self-pay

## 2018-01-11 ENCOUNTER — Ambulatory Visit: Payer: BLUE CROSS/BLUE SHIELD

## 2018-01-11 ENCOUNTER — Other Ambulatory Visit: Payer: Self-pay | Admitting: Hematology

## 2018-01-11 ENCOUNTER — Inpatient Hospital Stay: Payer: BLUE CROSS/BLUE SHIELD

## 2018-01-11 ENCOUNTER — Telehealth: Payer: Self-pay | Admitting: Pharmacy Technician

## 2018-01-11 ENCOUNTER — Encounter: Payer: Self-pay | Admitting: Hematology

## 2018-01-11 ENCOUNTER — Inpatient Hospital Stay: Payer: BLUE CROSS/BLUE SHIELD | Attending: Hematology | Admitting: Hematology

## 2018-01-11 ENCOUNTER — Telehealth: Payer: Self-pay | Admitting: Hematology

## 2018-01-11 VITALS — BP 143/94 | HR 93 | Temp 98.6°F | Resp 18 | Ht 68.0 in | Wt 157.5 lb

## 2018-01-11 DIAGNOSIS — R0602 Shortness of breath: Secondary | ICD-10-CM | POA: Diagnosis not present

## 2018-01-11 DIAGNOSIS — R682 Dry mouth, unspecified: Secondary | ICD-10-CM | POA: Diagnosis not present

## 2018-01-11 DIAGNOSIS — N179 Acute kidney failure, unspecified: Secondary | ICD-10-CM | POA: Insufficient documentation

## 2018-01-11 DIAGNOSIS — R42 Dizziness and giddiness: Secondary | ICD-10-CM | POA: Insufficient documentation

## 2018-01-11 DIAGNOSIS — D638 Anemia in other chronic diseases classified elsewhere: Secondary | ICD-10-CM | POA: Diagnosis not present

## 2018-01-11 DIAGNOSIS — Z5111 Encounter for antineoplastic chemotherapy: Secondary | ICD-10-CM | POA: Diagnosis not present

## 2018-01-11 DIAGNOSIS — F17210 Nicotine dependence, cigarettes, uncomplicated: Secondary | ICD-10-CM | POA: Insufficient documentation

## 2018-01-11 DIAGNOSIS — R5383 Other fatigue: Secondary | ICD-10-CM | POA: Diagnosis not present

## 2018-01-11 DIAGNOSIS — Z79899 Other long term (current) drug therapy: Secondary | ICD-10-CM | POA: Insufficient documentation

## 2018-01-11 DIAGNOSIS — R200 Anesthesia of skin: Secondary | ICD-10-CM

## 2018-01-11 DIAGNOSIS — I10 Essential (primary) hypertension: Secondary | ICD-10-CM | POA: Diagnosis not present

## 2018-01-11 DIAGNOSIS — B1920 Unspecified viral hepatitis C without hepatic coma: Secondary | ICD-10-CM | POA: Insufficient documentation

## 2018-01-11 DIAGNOSIS — C9010 Plasma cell leukemia not having achieved remission: Secondary | ICD-10-CM

## 2018-01-11 DIAGNOSIS — Z87891 Personal history of nicotine dependence: Secondary | ICD-10-CM | POA: Insufficient documentation

## 2018-01-11 DIAGNOSIS — N400 Enlarged prostate without lower urinary tract symptoms: Secondary | ICD-10-CM | POA: Insufficient documentation

## 2018-01-11 DIAGNOSIS — M79641 Pain in right hand: Secondary | ICD-10-CM | POA: Insufficient documentation

## 2018-01-11 DIAGNOSIS — R0781 Pleurodynia: Secondary | ICD-10-CM

## 2018-01-11 DIAGNOSIS — M84551A Pathological fracture in neoplastic disease, right femur, initial encounter for fracture: Secondary | ICD-10-CM

## 2018-01-11 DIAGNOSIS — R2689 Other abnormalities of gait and mobility: Secondary | ICD-10-CM

## 2018-01-11 DIAGNOSIS — C9000 Multiple myeloma not having achieved remission: Secondary | ICD-10-CM | POA: Diagnosis not present

## 2018-01-11 DIAGNOSIS — Z9889 Other specified postprocedural states: Secondary | ICD-10-CM | POA: Insufficient documentation

## 2018-01-11 LAB — CMP (CANCER CENTER ONLY)
ALK PHOS: 118 U/L (ref 40–150)
ALT: 26 U/L (ref 0–55)
AST: 20 U/L (ref 5–34)
Albumin: 2.9 g/dL — ABNORMAL LOW (ref 3.5–5.0)
Anion gap: 9 (ref 3–11)
BUN: 24 mg/dL (ref 7–26)
CALCIUM: 8.3 mg/dL — AB (ref 8.4–10.4)
CHLORIDE: 112 mmol/L — AB (ref 98–109)
CO2: 22 mmol/L (ref 22–29)
CREATININE: 1.92 mg/dL — AB (ref 0.70–1.30)
GFR, EST AFRICAN AMERICAN: 42 mL/min — AB (ref >60)
GFR, Estimated: 36 mL/min — ABNORMAL LOW (ref >60)
GLUCOSE: 98 mg/dL (ref 70–140)
Potassium: 4.6 mmol/L (ref 3.5–5.1)
SODIUM: 143 mmol/L (ref 136–145)
Total Bilirubin: 0.3 mg/dL (ref 0.2–1.2)
Total Protein: 6.1 g/dL — ABNORMAL LOW (ref 6.4–8.3)

## 2018-01-11 LAB — CBC WITH DIFFERENTIAL (CANCER CENTER ONLY)
BASOS ABS: 0 10*3/uL (ref 0.0–0.1)
BASOS PCT: 0 %
EOS ABS: 0.1 10*3/uL (ref 0.0–0.5)
EOS PCT: 1 %
HCT: 29.7 % — ABNORMAL LOW (ref 38.4–49.9)
Hemoglobin: 9.6 g/dL — ABNORMAL LOW (ref 13.0–17.1)
LYMPHS PCT: 29 %
Lymphs Abs: 3 10*3/uL (ref 0.9–3.3)
MCH: 29.7 pg (ref 27.2–33.4)
MCHC: 32.3 g/dL (ref 32.0–36.0)
MCV: 92 fL (ref 79.3–98.0)
Monocytes Absolute: 1.1 10*3/uL — ABNORMAL HIGH (ref 0.1–0.9)
Monocytes Relative: 11 %
Neutro Abs: 6.2 10*3/uL (ref 1.5–6.5)
Neutrophils Relative %: 59 %
PLATELETS: 523 10*3/uL — AB (ref 140–400)
RBC: 3.23 MIL/uL — AB (ref 4.20–5.82)
RDW: 16.5 % — ABNORMAL HIGH (ref 11.0–14.6)
WBC: 10.4 10*3/uL — AB (ref 4.0–10.3)

## 2018-01-11 MED ORDER — ONDANSETRON HCL 8 MG PO TABS
4.0000 mg | ORAL_TABLET | Freq: Once | ORAL | Status: AC
  Administered 2018-01-11: 4 mg via ORAL

## 2018-01-11 MED ORDER — ONDANSETRON HCL 8 MG PO TABS
ORAL_TABLET | ORAL | Status: AC
  Filled 2018-01-11: qty 1

## 2018-01-11 MED ORDER — BORTEZOMIB CHEMO SQ INJECTION 3.5 MG (2.5MG/ML)
1.3000 mg/m2 | Freq: Once | INTRAMUSCULAR | Status: AC
  Administered 2018-01-11: 2.25 mg via SUBCUTANEOUS
  Filled 2018-01-11: qty 2.25

## 2018-01-11 MED ORDER — DENOSUMAB 120 MG/1.7ML ~~LOC~~ SOLN: 120.0000 mg | Freq: Once | SUBCUTANEOUS | Status: DC

## 2018-01-11 MED ORDER — DENOSUMAB 120 MG/1.7ML ~~LOC~~ SOLN
SUBCUTANEOUS | Status: AC
  Filled 2018-01-11: qty 1.7

## 2018-01-11 NOTE — Progress Notes (Signed)
Per Dr Irene Limbo ok to tx today with creatinine of 1.92.

## 2018-01-11 NOTE — Telephone Encounter (Signed)
Oral Oncology Patient Advocate Encounter  Received notification from [facility]  that prior authorization for Cytoxan is required.  PA submitted on CoverMyMeds Key [id number] Status is pending  Oral Oncology Clinic will continue to follow.  Fabio Asa. Melynda Keller, [place] Patient [place] [phone] 01/11/2018 11:04 AM

## 2018-01-11 NOTE — Progress Notes (Signed)
Dr. Irene Limbo okay to tx with ctn 1.92.

## 2018-01-11 NOTE — Telephone Encounter (Signed)
Oral Oncology Patient Advocate Encounter  Received notification from [facility] that Prior Authorization for cyclophosphamide is not required.  I have updated [facility] to continue processing the prescription.   Oral Oncology Clinic will continue to follow.   Fabio Asa. Melynda Keller, [place] Patient [place] [phone] 01/11/2018 11:20 AM

## 2018-01-11 NOTE — Telephone Encounter (Signed)
DME orders placed per Santiago Glad, [facility] Coordinator for tub bench and toilet raised seat. Called and left VM that order placed as directed by Dr. Irene Limbo so that pt can be contacted regarding cost and location option for purchase. [phone]

## 2018-01-11 NOTE — Telephone Encounter (Signed)
Called pt to notify that orders were placed for the tub bench and raised toilet seat. Additionally, pt does not need to have labwork on Monday. Pt aware of cancellation and to arrive at [facility] for 0900 lab appt on Monday.

## 2018-01-11 NOTE — Telephone Encounter (Signed)
Scheduled appt per 2/1 los - Pt to get an updated schedule next visit.

## 2018-01-11 NOTE — Patient Instructions (Addendum)
[facility] Discharge Instructions for Patients Receiving Chemotherapy  Today you received the following chemotherapy agents :  Velcade.  To help prevent nausea and vomiting after your treatment, we encourage you to take your nausea medication as prescribed.  APPT  WITH  [facility]  ON   Thursday   Jan 03, 2018  AT  0830  AM.    If you develop nausea and vomiting that is not controlled by your nausea medication, call the clinic.   BELOW ARE SYMPTOMS THAT SHOULD BE REPORTED IMMEDIATELY:  *FEVER GREATER THAN 100.5 F  *CHILLS WITH OR WITHOUT FEVER  NAUSEA AND VOMITING THAT IS NOT CONTROLLED WITH YOUR NAUSEA MEDICATION  *UNUSUAL SHORTNESS OF BREATH  *UNUSUAL BRUISING OR BLEEDING  TENDERNESS IN MOUTH AND THROAT WITH OR WITHOUT PRESENCE OF ULCERS  *URINARY PROBLEMS  *BOWEL PROBLEMS  UNUSUAL RASH Items with * indicate a potential emergency and should be followed up as soon as possible.  Feel free to call the clinic should you have any questions or concerns. The clinic phone number is [phone].  Please show the [place] at check-in to the Emergency Department and triage nurse.  Denosumab injection Delton See) What is this medicine? DENOSUMAB (den oh sue mab) slows bone breakdown. Prolia is used to treat osteoporosis in women after menopause and in men. Delton See is used to treat a high calcium level due to cancer and to prevent bone fractures and other bone problems caused by multiple myeloma or cancer bone metastases. Delton See is also used to treat giant cell tumor of the bone. This medicine may be used for other purposes; ask your health care provider or pharmacist if you have questions. COMMON BRAND NAME(S): Prolia, XGEVA What should I tell my health care provider before I take this medicine? They need to know if you have any of these conditions: -dental disease -having surgery or tooth extraction -infection -kidney disease -low levels of calcium or  Vitamin D in the blood -malnutrition -on hemodialysis -skin conditions or sensitivity -thyroid or parathyroid disease -an unusual reaction to denosumab, other medicines, foods, dyes, or preservatives -pregnant or trying to get pregnant -breast-feeding How should I use this medicine? This medicine is for injection under the skin. It is given by a health care professional in a hospital or clinic setting. If you are getting Prolia, a special MedGuide will be given to you by the pharmacist with each prescription and refill. Be sure to read this information carefully each time. For Prolia, talk to your pediatrician regarding the use of this medicine in children. Special care may be needed. For Delton See, talk to your pediatrician regarding the use of this medicine in children. While this drug may be prescribed for children as young as 13 years for selected conditions, precautions do apply. Overdosage: If you think you have taken too much of this medicine contact a poison control center or emergency room at once. NOTE: This medicine is only for you. Do not share this medicine with others. What if I miss a dose? It is important not to miss your dose. Call your doctor or health care professional if you are unable to keep an appointment. What may interact with this medicine? Do not take this medicine with any of the following medications: -other medicines containing denosumab This medicine may also interact with the following medications: -medicines that lower your chance of fighting infection -steroid medicines like prednisone or cortisone This list may not describe all possible interactions.  Give your health care provider a list of all the medicines, herbs, non-prescription drugs, or dietary supplements you use. Also tell them if you smoke, drink alcohol, or use illegal drugs. Some items may interact with your medicine. What should I watch for while using this medicine? Visit your doctor or health care  professional for regular checks on your progress. Your doctor or health care professional may order blood tests and other tests to see how you are doing. Call your doctor or health care professional for advice if you get a fever, chills or sore throat, or other symptoms of a cold or flu. Do not treat yourself. This drug may decrease your body's ability to fight infection. Try to avoid being around people who are sick. You should make sure you get enough calcium and vitamin D while you are taking this medicine, unless your doctor tells you not to. Discuss the foods you eat and the vitamins you take with your health care professional. See your dentist regularly. Brush and floss your teeth as directed. Before you have any dental work done, tell your dentist you are receiving this medicine. Do not become pregnant while taking this medicine or for 5 months after stopping it. Talk with your doctor or health care professional about your birth control options while taking this medicine. Women should inform their doctor if they wish to become pregnant or think they might be pregnant. There is a potential for serious side effects to an unborn child. Talk to your health care professional or pharmacist for more information. What side effects may I notice from receiving this medicine? Side effects that you should report to your doctor or health care professional as soon as possible: -allergic reactions like skin rash, itching or hives, swelling of the face, lips, or tongue -bone pain -breathing problems -dizziness -jaw pain, especially after dental work -redness, blistering, peeling of the skin -signs and symptoms of infection like fever or chills; cough; sore throat; pain or trouble passing urine -signs of low calcium like fast heartbeat, muscle cramps or muscle pain; pain, tingling, numbness in the hands or feet; seizures -unusual bleeding or bruising -unusually weak or tired Side effects that usually do not  require medical attention (report to your doctor or health care professional if they continue or are bothersome): -constipation -diarrhea -headache -joint pain -loss of appetite -muscle pain -runny nose -tiredness -upset stomach This list may not describe all possible side effects. Call your doctor for medical advice about side effects. You may report side effects to FDA at 1-800-FDA-1088. Where should I keep my medicine? This medicine is only given in a clinic, doctor's office, or other health care setting and will not be stored at home. NOTE: This sheet is a summary. It may not cover all possible information. If you have questions about this medicine, talk to your doctor, pharmacist, or health care provider.  2018 Elsevier/Gold Standard (2016-12-19 19:17:21)

## 2018-01-14 ENCOUNTER — Other Ambulatory Visit: Payer: BLUE CROSS/BLUE SHIELD

## 2018-01-14 ENCOUNTER — Inpatient Hospital Stay: Payer: BLUE CROSS/BLUE SHIELD

## 2018-01-14 VITALS — BP 135/89 | HR 89 | Temp 98.2°F | Resp 18 | Wt 156.2 lb

## 2018-01-14 DIAGNOSIS — C9010 Plasma cell leukemia not having achieved remission: Secondary | ICD-10-CM

## 2018-01-14 LAB — KAPPA/LAMBDA LIGHT CHAINS
KAPPA FREE LGHT CHN: 437.3 mg/L — AB (ref 3.3–19.4)
Kappa, lambda light chain ratio: 20.92 — ABNORMAL HIGH (ref 0.26–1.65)
LAMDA FREE LIGHT CHAINS: 20.9 mg/L (ref 5.7–26.3)

## 2018-01-14 MED ORDER — ONDANSETRON HCL 8 MG PO TABS
ORAL_TABLET | ORAL | Status: AC
Start: 2018-01-14 — End: 2018-01-14
  Filled 2018-01-14: qty 1

## 2018-01-14 MED ORDER — BORTEZOMIB CHEMO SQ INJECTION 3.5 MG (2.5MG/ML)
1.3000 mg/m2 | Freq: Once | INTRAMUSCULAR | Status: AC
  Administered 2018-01-14: 2.25 mg via SUBCUTANEOUS
  Filled 2018-01-14: qty 2.25

## 2018-01-14 MED ORDER — PROCHLORPERAZINE MALEATE 10 MG PO TABS
10.0000 mg | ORAL_TABLET | Freq: Once | ORAL | Status: AC
  Administered 2018-01-14: 10 mg via ORAL

## 2018-01-14 MED ORDER — PROCHLORPERAZINE MALEATE 10 MG PO TABS
ORAL_TABLET | ORAL | Status: AC
  Filled 2018-01-14: qty 1

## 2018-01-14 MED ORDER — ONDANSETRON HCL 8 MG PO TABS
4.0000 mg | ORAL_TABLET | Freq: Once | ORAL | Status: AC
  Administered 2018-01-14: 4 mg via ORAL

## 2018-01-14 NOTE — Patient Instructions (Signed)
[facility] Discharge Instructions for Patients Receiving Chemotherapy  Today you received the following chemotherapy agents Velcade.  To help prevent nausea and vomiting after your treatment, we encourage you to take your nausea medication as directed.  If you develop nausea and vomiting that is not controlled by your nausea medication, call the clinic.   BELOW ARE SYMPTOMS THAT SHOULD BE REPORTED IMMEDIATELY:  *FEVER GREATER THAN 100.5 F  *CHILLS WITH OR WITHOUT FEVER  NAUSEA AND VOMITING THAT IS NOT CONTROLLED WITH YOUR NAUSEA MEDICATION  *UNUSUAL SHORTNESS OF BREATH  *UNUSUAL BRUISING OR BLEEDING  TENDERNESS IN MOUTH AND THROAT WITH OR WITHOUT PRESENCE OF ULCERS  *URINARY PROBLEMS  *BOWEL PROBLEMS  UNUSUAL RASH Items with * indicate a potential emergency and should be followed up as soon as possible.  Feel free to call the clinic should you have any questions or concerns. The clinic phone number is [phone].  Please show the CHEMO ALERT CARD at check-in to the Emergency Department and triage nurse.   

## 2018-01-15 ENCOUNTER — Ambulatory Visit: Payer: BLUE CROSS/BLUE SHIELD | Attending: Hematology | Admitting: Physical Therapy

## 2018-01-15 DIAGNOSIS — M79651 Pain in right thigh: Secondary | ICD-10-CM | POA: Insufficient documentation

## 2018-01-15 DIAGNOSIS — R262 Difficulty in walking, not elsewhere classified: Secondary | ICD-10-CM | POA: Diagnosis present

## 2018-01-15 DIAGNOSIS — M6281 Muscle weakness (generalized): Secondary | ICD-10-CM

## 2018-01-15 LAB — MULTIPLE MYELOMA PANEL, SERUM
ALPHA 1: 0.2 g/dL (ref 0.0–0.4)
ALPHA2 GLOB SERPL ELPH-MCNC: 1.1 g/dL — AB (ref 0.4–1.0)
Albumin SerPl Elph-Mcnc: 2.9 g/dL (ref 2.9–4.4)
Albumin/Glob SerPl: 1.1 (ref 0.7–1.7)
B-GLOBULIN SERPL ELPH-MCNC: 0.8 g/dL (ref 0.7–1.3)
Gamma Glob SerPl Elph-Mcnc: 0.6 g/dL (ref 0.4–1.8)
Globulin, Total: 2.8 g/dL (ref 2.2–3.9)
IGG (IMMUNOGLOBIN G), SERUM: 683 mg/dL — AB (ref 700–1600)
IgA: 22 mg/dL — ABNORMAL LOW (ref 90–386)
IgM (Immunoglobulin M), Srm: 17 mg/dL — ABNORMAL LOW (ref 20–172)
TOTAL PROTEIN ELP: 5.7 g/dL — AB (ref 6.0–8.5)

## 2018-01-15 NOTE — Therapy (Signed)
[place], [place], [address] Phone: [phone]   Fax:  [phone]  Physical Therapy Treatment  Patient Details  Name: Andres Fernandez MRN: [id number] Date of Birth: [birth year] Referring Provider: Irene Limbo   Encounter Date: 01/15/2018  PT End of Session - 01/15/18 1718    Visit Number  3    Number of Visits  17    Date for PT Re-Evaluation  03/04/18    PT Start Time  7096    PT Stop Time  1430    PT Time Calculation (min)  45 min    Activity Tolerance  Patient tolerated treatment well    Behavior During Therapy  [facility] for tasks assessed/performed       Past Medical History:  Diagnosis Date  . BPH (benign prostatic hyperplasia)   . Hepatitis C 11/27/2017  . Hypertension   . 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 Tobacco abuse     Past Surgical History:  Procedure Laterality Date  . APPENDECTOMY    . FEMUR IM NAIL Right 11/20/2017   Procedure: RIGHT FEMORAL INTRAMEDULLARY (IM) NAIL;  Surgeon: Nicholes Stairs, MD;  Location: [facility];  Service: Orthopedics;  Laterality: Right;    There were no vitals filed for this visit.  Subjective Assessment - 01/15/18 1351    Subjective  "I'm a little stiff" He will be scheduled to see a hand therapist as he has been having some trouble with his right hand.  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  No/denies                      [facility] Adult PT Treatment/Exercise - 01/15/18 0001      Self-Care   Self-Care  Other Self-Care Comments    Other Self-Care Comments   Advanced call him about tub bench and toilet riser, but he said he doesn't need them.  He gave the garment presctiption to Dr. Irene Limbo but didn't get it back.  He thinks his legs look  better today       Knee/Hip Exercises: Standing   Heel Raises  Right;Left;10 reps    Forward Lunges  Both;5 reps mini lunge    Hip Abduction  Stengthening;Right;Left;5 reps    Functional Squat  10 reps high mat     Wall Squat  10 reps wall tap       Knee/Hip Exercises: Seated   Long Arc Quad  Strengthening;Right;Left;10 reps;Weights    Long Arc Quad Weight  2 lbs.    Marching  Strengthening;Right;Left      Knee/Hip Exercises: Supine   Short Arc Quad Sets  Strengthening;Right;Left;2 sets;10 reps      Knee/Hip Exercises: Sidelying   Hip ABduction  Strengthening;Right;Left;5 reps    Clams  bot legs x 5 reps     Other Sidelying Knee/Hip Exercises  fire hydrants x 5 reps       Knee/Hip Exercises: Prone   Hamstring Curl  10 reps 2# on each ankle       Shoulder Exercises: Supine   Other Supine Exercises  with 2# weight on each wrist, 5 reps of shoulder flexion to 90, bicep curl and tricep skull crusher with  guarding       Shoulder Exercises: Standing   Row  Strengthening;Right;Left;10 reps;Theraband    Theraband Level (Shoulder Row)  Level 1 (Yellow)               PT Short Term Goals - 01/07/18 1653      PT SHORT TERM GOAL #1   Title  Pt to be able to stand from chair with no use of UEs to decrease fall risk    Time  4    Period  Weeks    Status  New    Target Date  02/04/18      PT SHORT TERM GOAL #2   Title  Pt to demonstrate 3+/5 right hip flexion to decrease fall risk    Baseline  3/5    Time  4    Period  Weeks    Status  New    Target Date  02/04/18      PT SHORT TERM GOAL #3   Title  Pt to be able to sit to stand from chair 8 times in 30 seconds to decrease fall risk    Baseline  6    Time  4    Period  Weeks    Status  New    Target Date  02/04/18        PT Long Term Goals - 01/07/18 1655      PT LONG TERM GOAL #1   Title  Pt to be independent in a home exercise program for continued strengthening and stretching    Time  8    Period  Weeks     Status  New    Target Date  03/04/18      PT LONG TERM GOAL #2   Title  Pt to demonstrate 4/5 right hip flexion strength to decrease fall risk    Baseline  3/5    Time  8    Period  Weeks    Status  New    Target Date  03/04/18      PT LONG TERM GOAL #3   Title  Pt to demosntrate 4/5 right knee flexor strength to decrease risk of falls    Baseline  3/5    Time  4    Period  Weeks    Status  New    Target Date  03/04/18      PT LONG TERM GOAL #4   Title  Pt to be able to complete TUG in less than 30 seconds with no assistive device to decrease risk of falls    Baseline  17 seconds    Time  8    Period  Weeks    Status  New    Target Date  03/04/18      PT LONG TERM GOAL #5   Title  Pt to be able to complete 14 sit to stands in 30 seconds to decrease risk of falls without use of UEs for support    Baseline  6 with use of UEs    Time  8    Period  Weeks    Status  New    Target Date  03/04/18      Additional Long Term Goals   Additional Long Term Goals  Yes      PT LONG TERM GOAL #6   Title  Pt will be able to ambulate without use of cane to increase independence and allow pt to return to work  Baseline  pt uses straight cane    Time  8    Period  Weeks    Status  New    Target Date  03/04/18            Plan - 01/15/18 1719    Clinical Impression Statement  Pt is working hard in therapy with LE exercises, but continues to have occasional Rhip pain and feels weakness in his right leg with functional activities  Had to modify some exercises today due to pain in his right hand     Rehab Potential  Good    Clinical Impairments Affecting Rehab Potential  pt has degeneration of left femur bone as well    PT Frequency  2x / week    PT Duration  8 weeks    PT Treatment/Interventions  ADLs/Self Care Home Management;Gait training;Therapeutic exercise;Therapeutic activities;Patient/family education;Balance training;Neuromuscular re-education;Manual techniques;Manual  lymph drainage;Passive range of motion;Taping;Orthotic Fit/Training    PT Next Visit Plan  Cont RLE strengthening exercises reviewing HEP - supine and sitting, give stretching for hip flexors/trunk, see if pt got rx signed for garments,        Patient will benefit from skilled therapeutic intervention in order to improve the following deficits and impairments:  Decreased balance, Abnormal gait, Decreased endurance, Decreased mobility, Difficulty walking, Increased edema, Decreased activity tolerance, Decreased strength, Pain  Visit Diagnosis: Muscle weakness (generalized)  Pain in right thigh  Difficulty in walking, not elsewhere classified     Problem List Patient Active Problem List   Diagnosis Date Noted  . 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 Malnutrition of moderate degree 12/02/2017  . AKI (acute kidney injury) ([place]) 11/27/2017  . SIRS (systemic inflammatory response syndrome) ([place]) 11/27/2017  . Anemia 11/27/2017  . Thrombocytopenia ([place]) 11/27/2017  . Hepatitis C 11/27/2017  . Cancer associated pain   . Tumor lysis syndrome   . Encounter for antineoplastic chemotherapy   . 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 Pathological fracture in neoplastic disease, right femur, initial encounter for fracture ([place]) 11/23/2017  . Lytic bone lesion of right femur 11/20/2017  . Acute renal failure ([place]) 11/17/2017  . ?? Multiple myeloma vs other bone marrow malignancy 11/17/2017  . Hypercalcemia 11/17/2017  . BPH (benign prostatic hyperplasia) 11/17/2017  . Tobacco abuse 11/17/2017  . Elevated blood-pressure reading without diagnosis of hypertension 11/17/2017  . Lytic bone lesions on xray 11/17/2017   Donato Heinz. Owens Shark PT  Norwood Levo 01/15/2018, 5:26 PM  [place], [place], [address] Phone: [phone]   Fax:  [phone]  Name: Andres Fernandez MRN:  [id number] Date of Birth: [birth year]

## 2018-01-17 ENCOUNTER — Other Ambulatory Visit: Payer: Self-pay | Admitting: *Deleted

## 2018-01-17 ENCOUNTER — Ambulatory Visit: Payer: BLUE CROSS/BLUE SHIELD

## 2018-01-17 DIAGNOSIS — M79651 Pain in right thigh: Secondary | ICD-10-CM

## 2018-01-17 DIAGNOSIS — M6281 Muscle weakness (generalized): Secondary | ICD-10-CM | POA: Diagnosis not present

## 2018-01-17 DIAGNOSIS — R262 Difficulty in walking, not elsewhere classified: Secondary | ICD-10-CM

## 2018-01-17 DIAGNOSIS — C9010 Plasma cell leukemia not having achieved remission: Secondary | ICD-10-CM

## 2018-01-17 NOTE — Therapy (Signed)
[place], [place], [address] Phone: [phone]   Fax:  [phone]  Physical Therapy Treatment  Patient Details  Name: Andres Fernandez MRN: [id number] Date of Birth: 12-17-1955 Referring Provider: Irene Limbo   Encounter Date: 01/17/2018  PT End of Session - 01/17/18 1013    Visit Number  4    Number of Visits  17    Date for PT Re-Evaluation  03/04/18    PT Start Time  0931    PT Stop Time  1015    PT Time Calculation (min)  44 min    Activity Tolerance  Patient tolerated treatment well    Behavior During Therapy  [facility] for tasks assessed/performed       Past Medical History:  Diagnosis Date  . BPH (benign prostatic hyperplasia)   . Hepatitis C 11/27/2017  . Hypertension   . 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 Tobacco abuse     Past Surgical History:  Procedure Laterality Date  . APPENDECTOMY    . FEMUR IM NAIL Right 11/20/2017   Procedure: RIGHT FEMORAL INTRAMEDULLARY (IM) NAIL;  Surgeon: Nicholes Stairs, MD;  Location: [facility];  Service: Orthopedics;  Laterality: Right;    There were no vitals filed for this visit.  Subjective Assessment - 01/17/18 0934    Subjective  I'm doing alot better at home. I don't have to use my hands to push off the toilet, still a little unsteady getting in/out of tub but it's improved. But my waist pain has not changed and I just don't understand that because I've been walking and doing my HEP and that just hasn't changed. I'm getting another chemo treatment tomorrow and I'm going to try to ask a doctor about the waist pain. My Rt thigh is stronger and that's why I'm standing up easier, but I still feel the waist pain (pt points to bil hip flexor tendons) is the same when I stand up, and I have to take a Tylenol to keep it from getting worse daily.     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  No/denies 4/10 with sit-stand without tylenol                      [facility] Adult PT Treatment/Exercise - 01/17/18 0001      Knee/Hip Exercises: Stretches   Passive Hamstring Stretch  Right;Left;3 reps;20 seconds VCs and demonstration for correct technique    Hip Flexor Stretch  Right;Left;3 reps;20 seconds lunge stretch at wall      Knee/Hip Exercises: Aerobic   Recumbent Bike  Level 1, x 5 mins (60-65 rpm though encouraged pt to slow down)      Knee/Hip Exercises: Standing   Heel Raises  Both;10 reps    Knee Flexion  Strengthening;Right;Left;10 reps 2 lbs each ankle at back of bike for +2 HHA    Forward Lunges  Right;Left;5 reps mini lunges and only 4 with Rt in back due to weakness    Hip Abduction  Stengthening;Right;Left;2 sets;5 reps On Airex on back of bike for for +1 HHA    Wall Squat  10 reps wall taps      Knee/Hip Exercises: Seated   Long Arc Quad  Strengthening;Right;Left;20  reps;Weights    Long [employer] Weight  2 lbs.    Ball Squeeze  20x with 5 sec holds using purple ball    Marching  Strengthening;Right;Left;10 reps;Weights    Marching Weights  2 lbs.               PT Short Term Goals - 01/07/18 1653      PT SHORT TERM GOAL #1   Title  Pt to be able to stand from chair with no use of UEs to decrease fall risk    Time  4    Period  Weeks    Status  New    Target Date  02/04/18      PT SHORT TERM GOAL #2   Title  Pt to demonstrate 3+/5 right hip flexion to decrease fall risk    Baseline  3/5    Time  4    Period  Weeks    Status  New    Target Date  02/04/18      PT SHORT TERM GOAL #3   Title  Pt to be able to sit to stand from chair 8 times in 30 seconds to decrease fall risk    Baseline  6    Time  4    Period  Weeks    Status  New    Target Date  02/04/18        PT Long Term Goals - 01/07/18 1655      PT  LONG TERM GOAL #1   Title  Pt to be independent in a home exercise program for continued strengthening and stretching    Time  8    Period  Weeks    Status  New    Target Date  03/04/18      PT LONG TERM GOAL #2   Title  Pt to demonstrate 4/5 right hip flexion strength to decrease fall risk    Baseline  3/5    Time  8    Period  Weeks    Status  New    Target Date  03/04/18      PT LONG TERM GOAL #3   Title  Pt to demosntrate 4/5 right knee flexor strength to decrease risk of falls    Baseline  3/5    Time  4    Period  Weeks    Status  New    Target Date  03/04/18      PT LONG TERM GOAL #4   Title  Pt to be able to complete TUG in less than 30 seconds with no assistive device to decrease risk of falls    Baseline  17 seconds    Time  8    Period  Weeks    Status  New    Target Date  03/04/18      PT LONG TERM GOAL #5   Title  Pt to be able to complete 14 sit to stands in 30 seconds to decrease risk of falls without use of UEs for support    Baseline  6 with use of UEs    Time  8    Period  Weeks    Status  New    Target Date  03/04/18      Additional Long Term Goals   Additional Long Term Goals  Yes      PT LONG TERM GOAL #6   Title  Pt will be able to ambulate without use of  cane to increase independence and allow pt to return to work    Baseline  pt uses straight cane    Time  8    Period  Weeks    Status  New    Target Date  03/04/18            Plan - 01/17/18 1014    Clinical Impression Statement  Pt continues to work hard in therapy. Progressed to include recumbent bike and standing on Airex with hip raises. Pt tolerated all this well without increased pain, just LE fatigue from exercising. Pt plans to talk to his doctor about his ongoing waist pain.  Pt does not feel he needs a raised toilet seat or compression garments at this time as he is doing better.     Rehab Potential  Good    Clinical Impairments Affecting Rehab Potential  pt has degeneration  of left femur bone as well    PT Frequency  2x / week    PT Duration  8 weeks    PT Treatment/Interventions  ADLs/Self Care Home Management;Gait training;Therapeutic exercise;Therapeutic activities;Patient/family education;Balance training;Neuromuscular re-education;Manual techniques;Manual lymph drainage;Passive range of motion;Taping;Orthotic Fit/Training    PT Next Visit Plan  Cont RLE strengthening exercises reviewing HEP - supine and sitting, give stretching for hip flexors/trunk.     Consulted and Agree with Plan of Care  Patient       Patient will benefit from skilled therapeutic intervention in order to improve the following deficits and impairments:  Decreased balance, Abnormal gait, Decreased endurance, Decreased mobility, Difficulty walking, Increased edema, Decreased activity tolerance, Decreased strength, Pain  Visit Diagnosis: Muscle weakness (generalized)  Pain in right thigh  Difficulty in walking, not elsewhere classified     Problem List Patient Active Problem List   Diagnosis Date Noted  . 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 Malnutrition of moderate degree 12/02/2017  . AKI (acute kidney injury) ([place]) 11/27/2017  . SIRS (systemic inflammatory response syndrome) ([place]) 11/27/2017  . Anemia 11/27/2017  . Thrombocytopenia ([place]) 11/27/2017  . Hepatitis C 11/27/2017  . Cancer associated pain   . Tumor lysis syndrome   . Encounter for antineoplastic chemotherapy   . 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 Pathological fracture in neoplastic disease, right femur, initial encounter for fracture ([place]) 11/23/2017  . Lytic bone lesion of right femur 11/20/2017  . Acute renal failure ([place]) 11/17/2017  . ?? Multiple myeloma vs other bone marrow malignancy 11/17/2017  . Hypercalcemia 11/17/2017  . BPH (benign prostatic hyperplasia) 11/17/2017  . Tobacco abuse 11/17/2017  . Elevated blood-pressure reading without diagnosis of  hypertension 11/17/2017  . Lytic bone lesions on xray 11/17/2017    Otelia Limes, PTA 01/17/2018, 10:18 AM  [place], [place], [address] Phone: [phone]   Fax:  [phone]  Name: Andres Fernandez MRN: [id number] Date of Birth: 06/12/1956

## 2018-01-18 ENCOUNTER — Other Ambulatory Visit: Payer: Self-pay | Admitting: *Deleted

## 2018-01-18 ENCOUNTER — Encounter: Payer: Self-pay | Admitting: *Deleted

## 2018-01-18 ENCOUNTER — Inpatient Hospital Stay: Payer: BLUE CROSS/BLUE SHIELD

## 2018-01-18 ENCOUNTER — Ambulatory Visit (HOSPITAL_COMMUNITY)
Admission: RE | Admit: 2018-01-18 | Discharge: 2018-01-18 | Disposition: A | Discharge status: Home / Self Care | Payer: BLUE CROSS/BLUE SHIELD | Source: Ambulatory Visit | Attending: Hematology | Admitting: Hematology

## 2018-01-18 VITALS — BP 151/91 | HR 82 | Temp 97.7°F | Resp 18 | Wt 156.0 lb

## 2018-01-18 DIAGNOSIS — C9010 Plasma cell leukemia not having achieved remission: Secondary | ICD-10-CM

## 2018-01-18 DIAGNOSIS — M79641 Pain in right hand: Secondary | ICD-10-CM

## 2018-01-18 DIAGNOSIS — M19041 Primary osteoarthritis, right hand: Secondary | ICD-10-CM | POA: Insufficient documentation

## 2018-01-18 LAB — CMP (CANCER CENTER ONLY)
ALT: 29 U/L (ref 0–55)
AST: 24 U/L (ref 5–34)
Albumin: 3.2 g/dL — ABNORMAL LOW (ref 3.5–5.0)
Alkaline Phosphatase: 113 U/L (ref 40–150)
Anion gap: 11 (ref 3–11)
BILIRUBIN TOTAL: 0.3 mg/dL (ref 0.2–1.2)
BUN: 20 mg/dL (ref 7–26)
CO2: 19 mmol/L — ABNORMAL LOW (ref 22–29)
CREATININE: 1.82 mg/dL — AB (ref 0.70–1.30)
Calcium: 7 mg/dL — ABNORMAL LOW (ref 8.4–10.4)
Chloride: 114 mmol/L — ABNORMAL HIGH (ref 98–109)
GFR, EST NON AFRICAN AMERICAN: 39 mL/min — AB (ref >60)
GFR, Est AFR Am: 45 mL/min — ABNORMAL LOW (ref >60)
Glucose, Bld: 95 mg/dL (ref 70–140)
POTASSIUM: 4.2 mmol/L (ref 3.5–5.1)
Sodium: 144 mmol/L (ref 136–145)
TOTAL PROTEIN: 6.4 g/dL (ref 6.4–8.3)

## 2018-01-18 LAB — CBC WITH DIFFERENTIAL (CANCER CENTER ONLY)
Basophils Absolute: 0.1 10*3/uL (ref 0.0–0.1)
Basophils Relative: 1 %
Eosinophils Absolute: 0 10*3/uL (ref 0.0–0.5)
Eosinophils Relative: 0 %
HEMATOCRIT: 32.5 % — AB (ref 38.4–49.9)
Hemoglobin: 10.7 g/dL — ABNORMAL LOW (ref 13.0–17.1)
LYMPHS ABS: 2.9 10*3/uL (ref 0.9–3.3)
LYMPHS PCT: 31 %
MCH: 30.1 pg (ref 27.2–33.4)
MCHC: 33 g/dL (ref 32.0–36.0)
MCV: 91.3 fL (ref 79.3–98.0)
MONO ABS: 0.8 10*3/uL (ref 0.1–0.9)
Monocytes Relative: 8 %
NEUTROS ABS: 5.5 10*3/uL (ref 1.5–6.5)
Neutrophils Relative %: 60 %
Platelet Count: 458 10*3/uL — ABNORMAL HIGH (ref 140–400)
RBC: 3.56 MIL/uL — ABNORMAL LOW (ref 4.20–5.82)
RDW: 16.4 % — AB (ref 11.0–14.6)
WBC Count: 9.3 10*3/uL (ref 4.0–10.3)

## 2018-01-18 MED ORDER — ONDANSETRON HCL 8 MG PO TABS
ORAL_TABLET | ORAL | Status: AC
  Filled 2018-01-18: qty 1

## 2018-01-18 MED ORDER — BORTEZOMIB CHEMO SQ INJECTION 3.5 MG (2.5MG/ML)
1.3000 mg/m2 | Freq: Once | INTRAMUSCULAR | Status: AC
  Administered 2018-01-18: 2.25 mg via SUBCUTANEOUS
  Filled 2018-01-18: qty 2.25

## 2018-01-18 MED ORDER — ONDANSETRON HCL 8 MG PO TABS
4.0000 mg | ORAL_TABLET | Freq: Once | ORAL | Status: AC
  Administered 2018-01-18: 4 mg via ORAL

## 2018-01-18 NOTE — Patient Instructions (Signed)
[facility] Discharge Instructions for Patients Receiving Chemotherapy  Today you received the following chemotherapy agents Velcade.  To help prevent nausea and vomiting after your treatment, we encourage you to take your nausea medication as directed.  If you develop nausea and vomiting that is not controlled by your nausea medication, call the clinic.   BELOW ARE SYMPTOMS THAT SHOULD BE REPORTED IMMEDIATELY:  *FEVER GREATER THAN 100.5 F  *CHILLS WITH OR WITHOUT FEVER  NAUSEA AND VOMITING THAT IS NOT CONTROLLED WITH YOUR NAUSEA MEDICATION  *UNUSUAL SHORTNESS OF BREATH  *UNUSUAL BRUISING OR BLEEDING  TENDERNESS IN MOUTH AND THROAT WITH OR WITHOUT PRESENCE OF ULCERS  *URINARY PROBLEMS  *BOWEL PROBLEMS  UNUSUAL RASH Items with * indicate a potential emergency and should be followed up as soon as possible.  Feel free to call the clinic should you have any questions or concerns. The clinic phone number is [phone].  Please show the CHEMO ALERT CARD at check-in to the Emergency Department and triage nurse.   

## 2018-01-18 NOTE — Progress Notes (Signed)
Vasc lab called earlier in the week to have them contact pt to set up apt for doppler.  LVM earlier in the week with patient stating no need for apt for xrays.  As of this morning 2/8, neither are completed. Pt here this morning for infusion. Sharlotte Alamo, RN in infusion to inform patient that he needs to go to [facility] radiology for xray after infusion apt. Appears doppler is set up for 2/12 now along with several other apts.  Informed Kristen, RN to tell pt that doppler needs to be done sooner than later, maybe other apts need to be rescheduled.  Cyril Mourning, RN to discuss will patient.

## 2018-01-18 NOTE — Progress Notes (Signed)
Per Dr. Irene Limbo ok to treat with Creatinine of 1.82.

## 2018-01-21 ENCOUNTER — Inpatient Hospital Stay: Payer: BLUE CROSS/BLUE SHIELD

## 2018-01-21 VITALS — BP 140/89 | HR 82 | Temp 98.2°F | Resp 18

## 2018-01-21 DIAGNOSIS — C9010 Plasma cell leukemia not having achieved remission: Secondary | ICD-10-CM

## 2018-01-21 LAB — CMP (CANCER CENTER ONLY)
ALBUMIN: 3.2 g/dL — AB (ref 3.5–5.0)
ALT: 27 U/L (ref 0–55)
ANION GAP: 11 (ref 3–11)
AST: 22 U/L (ref 5–34)
Alkaline Phosphatase: 102 U/L (ref 40–150)
BILIRUBIN TOTAL: 0.2 mg/dL (ref 0.2–1.2)
BUN: 19 mg/dL (ref 7–26)
CHLORIDE: 113 mmol/L — AB (ref 98–109)
CO2: 19 mmol/L — AB (ref 22–29)
Calcium: 7.6 mg/dL — ABNORMAL LOW (ref 8.4–10.4)
Creatinine: 1.77 mg/dL — ABNORMAL HIGH (ref 0.70–1.30)
GFR, Est AFR Am: 46 mL/min — ABNORMAL LOW (ref >60)
GFR, Estimated: 40 mL/min — ABNORMAL LOW (ref >60)
GLUCOSE: 88 mg/dL (ref 70–140)
POTASSIUM: 4 mmol/L (ref 3.5–5.1)
SODIUM: 143 mmol/L (ref 136–145)
Total Protein: 6.2 g/dL — ABNORMAL LOW (ref 6.4–8.3)

## 2018-01-21 LAB — CBC WITH DIFFERENTIAL (CANCER CENTER ONLY)
Basophils Absolute: 0 10*3/uL (ref 0.0–0.1)
Basophils Relative: 0 %
EOS PCT: 0 %
Eosinophils Absolute: 0 10*3/uL (ref 0.0–0.5)
HEMATOCRIT: 31.8 % — AB (ref 38.4–49.9)
Hemoglobin: 10.3 g/dL — ABNORMAL LOW (ref 13.0–17.1)
LYMPHS ABS: 2.9 10*3/uL (ref 0.9–3.3)
LYMPHS PCT: 24 %
MCH: 30 pg (ref 27.2–33.4)
MCHC: 32.4 g/dL (ref 32.0–36.0)
MCV: 92.7 fL (ref 79.3–98.0)
MONO ABS: 1.4 10*3/uL — AB (ref 0.1–0.9)
MONOS PCT: 12 %
NEUTROS ABS: 7.5 10*3/uL — AB (ref 1.5–6.5)
Neutrophils Relative %: 64 %
PLATELETS: 342 10*3/uL (ref 140–400)
RBC: 3.43 MIL/uL — ABNORMAL LOW (ref 4.20–5.82)
RDW: 16.4 % — AB (ref 11.0–14.6)
WBC Count: 11.8 10*3/uL — ABNORMAL HIGH (ref 4.0–10.3)

## 2018-01-21 MED ORDER — PROCHLORPERAZINE MALEATE 10 MG PO TABS
10.0000 mg | ORAL_TABLET | Freq: Once | ORAL | Status: AC
  Administered 2018-01-21: 10 mg via ORAL

## 2018-01-21 MED ORDER — PROCHLORPERAZINE MALEATE 10 MG PO TABS
ORAL_TABLET | ORAL | Status: AC
  Filled 2018-01-21: qty 1

## 2018-01-21 MED ORDER — BORTEZOMIB CHEMO SQ INJECTION 3.5 MG (2.5MG/ML)
1.3000 mg/m2 | Freq: Once | INTRAMUSCULAR | Status: AC
  Administered 2018-01-21: 2.25 mg via SUBCUTANEOUS
  Filled 2018-01-21: qty 2.25

## 2018-01-21 MED ORDER — ONDANSETRON HCL 8 MG PO TABS
4.0000 mg | ORAL_TABLET | Freq: Once | ORAL | Status: AC
  Administered 2018-01-21: 4 mg via ORAL

## 2018-01-21 MED ORDER — ONDANSETRON HCL 8 MG PO TABS
ORAL_TABLET | ORAL | Status: AC
  Filled 2018-01-21: qty 1

## 2018-01-21 NOTE — Patient Instructions (Signed)
[facility] Discharge Instructions for Patients Receiving Chemotherapy  Today you received the following chemotherapy agent:  Velcade.  To help prevent nausea and vomiting after your treatment, we encourage you to take your nausea medication as directed.   If you develop nausea and vomiting that is not controlled by your nausea medication, call the clinic.   BELOW ARE SYMPTOMS THAT SHOULD BE REPORTED IMMEDIATELY:  *FEVER GREATER THAN 100.5 F  *CHILLS WITH OR WITHOUT FEVER  NAUSEA AND VOMITING THAT IS NOT CONTROLLED WITH YOUR NAUSEA MEDICATION  *UNUSUAL SHORTNESS OF BREATH  *UNUSUAL BRUISING OR BLEEDING  TENDERNESS IN MOUTH AND THROAT WITH OR WITHOUT PRESENCE OF ULCERS  *URINARY PROBLEMS  *BOWEL PROBLEMS  UNUSUAL RASH Items with * indicate a potential emergency and should be followed up as soon as possible.  Feel free to call the clinic should you have any questions or concerns. The clinic phone number is [phone].  Please show the CHEMO ALERT CARD at check-in to the Emergency Department and triage nurse.   

## 2018-01-21 NOTE — Progress Notes (Signed)
Per Dr. Irene Limbo, pt okay to tx with ctn 1.77 and calcium 7.6. Ruben Im, RN notified.

## 2018-01-22 ENCOUNTER — Ambulatory Visit (INDEPENDENT_AMBULATORY_CARE_PROVIDER_SITE_OTHER): Payer: Self-pay | Admitting: Orthopaedic Surgery

## 2018-01-22 ENCOUNTER — Ambulatory Visit: Payer: BLUE CROSS/BLUE SHIELD

## 2018-01-22 ENCOUNTER — Ambulatory Visit (HOSPITAL_COMMUNITY)
Admission: RE | Admit: 2018-01-22 | Discharge: 2018-01-22 | Disposition: A | Discharge status: Home / Self Care | Payer: BLUE CROSS/BLUE SHIELD | Source: Ambulatory Visit | Attending: Hematology | Admitting: Hematology

## 2018-01-22 DIAGNOSIS — R262 Difficulty in walking, not elsewhere classified: Secondary | ICD-10-CM

## 2018-01-22 DIAGNOSIS — M6281 Muscle weakness (generalized): Secondary | ICD-10-CM

## 2018-01-22 DIAGNOSIS — M79641 Pain in right hand: Secondary | ICD-10-CM

## 2018-01-22 DIAGNOSIS — M79651 Pain in right thigh: Secondary | ICD-10-CM

## 2018-01-22 NOTE — Progress Notes (Signed)
Right upper extremity venous duplex completed. No evidence of DVT or superficial thrombosis.  [facility], [place] 01/22/2018, 10:38 am

## 2018-01-22 NOTE — Therapy (Signed)
[place], [place], [address] Phone: [phone]   Fax:  [phone]  Physical Therapy Treatment  Patient Details  Name: Andres Fernandez MRN: [id number] Date of Birth: 11/26/1956 Referring Provider: Irene Limbo   Encounter Date: 01/22/2018  PT End of Session - 01/22/18 1130    Visit Number  5    Number of Visits  17    Date for PT Re-Evaluation  03/04/18    PT Start Time  1100    PT Stop Time  1143    PT Time Calculation (min)  43 min    Activity Tolerance  Patient tolerated treatment well    Behavior During Therapy  [facility] for tasks assessed/performed       Past Medical History:  Diagnosis Date  . BPH (benign prostatic hyperplasia)   . Hepatitis C 11/27/2017  . Hypertension   . 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 Tobacco abuse     Past Surgical History:  Procedure Laterality Date  . APPENDECTOMY    . FEMUR IM NAIL Right 11/20/2017   Procedure: RIGHT FEMORAL INTRAMEDULLARY (IM) NAIL;  Surgeon: Nicholes Stairs, MD;  Location: [facility];  Service: Orthopedics;  Laterality: Right;    There were no vitals filed for this visit.  Subjective Assessment - 01/22/18 1102    Subjective  My Rt hand/forearm is still bothering me so I had an [name] today because they (at [facility]) wanted to rule out blood clots. It was clear, so they figure it's just tendonitis or arthritis. It mostly bothers me at night being painful and I can't quite make a fist waking up in the moring due to pain and weakness. But my waist pain is starting to improve and I'm really happy about that! It doesn't bother when I stand up any more (when asked he reports haven't felt it since last week after session). Been doing the stretches and exercises you've shown me and I really think that's what's been helping. And this morning when I went to get up from the toilet I didn't have to use my hands. I was really happy about that.     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  No/denies                      [facility] Adult PT Treatment/Exercise - 01/22/18 0001      Knee/Hip Exercises: Stretches   Passive Hamstring Stretch  Right;Left;2 reps;20 seconds VCs and demonstration for correct technique    Hip Flexor Stretch  Right;Left;2 reps;20 seconds Lunge for Lt (unable to Rt SLS), grab ankle for Rt       Knee/Hip Exercises: Aerobic   Recumbent Bike  Level 1, x 6 mins, pt with less fatigue and SOB with this today, also controlled his pace better at 50-55 rpm      Knee/Hip Exercises: Standing   Heel Raises  Both;10 reps;3 sets Last set Rt, then Lt leg    Forward Lunges  Right;Left;5 reps    Other Standing Knee Exercises  Standing on Airex with yellow theraband around thighs for bil hip abduction with fingertips on wall, 20 times with Rt LE and 2x5 with Lt (2nd set on floor as Rt LE  unable to tolerate unlevel surface due to instabililty/weakness in thigh).      Knee/Hip Exercises: Seated   Long Arc Quad  Strengthening;Right;Left;2 sets;10 reps;Weights 5 sec holds    Long Arc Quad Weight  2 lbs.    Ball Squeeze  20x with 5 sec holds using purple ball    Marching  Strengthening;Right;Left;10 reps;Weights    Marching Weights  2 lbs.               PT Short Term Goals - 01/07/18 1653      PT SHORT TERM GOAL #1   Title  Pt to be able to stand from chair with no use of UEs to decrease fall risk    Time  4    Period  Weeks    Status  New    Target Date  02/04/18      PT SHORT TERM GOAL #2   Title  Pt to demonstrate 3+/5 right hip flexion to decrease fall risk    Baseline  3/5    Time  4    Period  Weeks    Status  New    Target Date  02/04/18      PT SHORT TERM GOAL #3   Title  Pt to be able to sit to stand from chair 8 times in  30 seconds to decrease fall risk    Baseline  6    Time  4    Period  Weeks    Status  New    Target Date  02/04/18        PT Long Term Goals - 01/07/18 1655      PT LONG TERM GOAL #1   Title  Pt to be independent in a home exercise program for continued strengthening and stretching    Time  8    Period  Weeks    Status  New    Target Date  03/04/18      PT LONG TERM GOAL #2   Title  Pt to demonstrate 4/5 right hip flexion strength to decrease fall risk    Baseline  3/5    Time  8    Period  Weeks    Status  New    Target Date  03/04/18      PT LONG TERM GOAL #3   Title  Pt to demosntrate 4/5 right knee flexor strength to decrease risk of falls    Baseline  3/5    Time  4    Period  Weeks    Status  New    Target Date  03/04/18      PT LONG TERM GOAL #4   Title  Pt to be able to complete TUG in less than 30 seconds with no assistive device to decrease risk of falls    Baseline  17 seconds    Time  8    Period  Weeks    Status  New    Target Date  03/04/18      PT LONG TERM GOAL #5   Title  Pt to be able to complete 14 sit to stands in 30 seconds to decrease risk of falls without use of UEs for support    Baseline  6 with use of UEs    Time  8    Period  Weeks    Status  New    Target Date  03/04/18      Additional Long Term Goals  Additional Long Term Goals  Yes      PT LONG TERM GOAL #6   Title  Pt will be able to ambulate without use of cane to increase independence and allow pt to return to work    Baseline  pt uses straight cane    Time  8    Period  Weeks    Status  New    Target Date  03/04/18            Plan - 01/22/18 1153    Clinical Impression Statement  Pt came in with great improvements reported such as didn't need HHA to get off commode this monring and hasn't felt waist pain with sit-stand in about a week. He is being consistent with HEP thus far, though does still report mod weakness and instbaility in his Rt thigh. This was more  pronounced with progression of exercises today, like single leg heel raises were very challenging. Also standing on Airex for bil hip abd with theraband, pt was unable to cont this with Rt SLS. Ended up ending session just a few mins early per pts request as he was reporting increased fatigue and weakness in Rt thigh. Overall, though pt had some difficulty with progression of exercises during session he was still able to do them, though modified and is reporting great gains at home.     Rehab Potential  Good    Clinical Impairments Affecting Rehab Potential  pt has degeneration of left femur bone as well    PT Frequency  2x / week    PT Duration  8 weeks    PT Treatment/Interventions  ADLs/Self Care Home Management;Gait training;Therapeutic exercise;Therapeutic activities;Patient/family education;Balance training;Neuromuscular re-education;Manual techniques;Manual lymph drainage;Passive range of motion;Taping;Orthotic Fit/Training    PT Next Visit Plan  Cont RLE strengthening exercises reviewing HEP - supine and sitting, give stretching for hip flexors/trunk.     Consulted and Agree with Plan of Care  Patient       Patient will benefit from skilled therapeutic intervention in order to improve the following deficits and impairments:  Decreased balance, Abnormal gait, Decreased endurance, Decreased mobility, Difficulty walking, Increased edema, Decreased activity tolerance, Decreased strength, Pain  Visit Diagnosis: Muscle weakness (generalized)  Pain in right thigh  Difficulty in walking, not elsewhere classified     Problem List Patient Active Problem List   Diagnosis Date Noted  . 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 Malnutrition of moderate degree 12/02/2017  . AKI (acute kidney injury) ([place]) 11/27/2017  . SIRS (systemic inflammatory response syndrome) ([place]) 11/27/2017  . Anemia 11/27/2017  . Thrombocytopenia ([place]) 11/27/2017  . Hepatitis C  11/27/2017  . Cancer associated pain   . Tumor lysis syndrome   . Encounter for antineoplastic chemotherapy   . 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 Pathological fracture in neoplastic disease, right femur, initial encounter for fracture ([place]) 11/23/2017  . Lytic bone lesion of right femur 11/20/2017  . Acute renal failure ([place]) 11/17/2017  . ?? Multiple myeloma vs other bone marrow malignancy 11/17/2017  . Hypercalcemia 11/17/2017  . BPH (benign prostatic hyperplasia) 11/17/2017  . Tobacco abuse 11/17/2017  . Elevated blood-pressure reading without diagnosis of hypertension 11/17/2017  . Lytic bone lesions on xray 11/17/2017    Otelia Limes, PTA 01/22/2018, 11:57 AM  [place], [place], [address] Phone: [phone]   Fax:  [phone]  Name: Andres Fernandez MRN: [id number] Date  of Birth: 1956/11/16

## 2018-01-23 NOTE — Telephone Encounter (Signed)
Oral Oncology Patient Advocate Encounter  Notified by the patient's insurance plan, on 01/11/2018,  that a prior authorization is not required for this medication  Andres Fernandez. Andres Fernandez, [place] Patient [place] [phone] 01/23/2018 4:54 PM

## 2018-01-24 ENCOUNTER — Ambulatory Visit: Payer: BLUE CROSS/BLUE SHIELD | Admitting: Physical Therapy

## 2018-01-24 ENCOUNTER — Encounter: Payer: BLUE CROSS/BLUE SHIELD | Admitting: Internal Medicine

## 2018-01-24 ENCOUNTER — Encounter: Payer: Self-pay | Admitting: Physical Therapy

## 2018-01-24 DIAGNOSIS — M79651 Pain in right thigh: Secondary | ICD-10-CM

## 2018-01-24 DIAGNOSIS — R262 Difficulty in walking, not elsewhere classified: Secondary | ICD-10-CM

## 2018-01-24 DIAGNOSIS — M6281 Muscle weakness (generalized): Secondary | ICD-10-CM | POA: Diagnosis not present

## 2018-01-24 NOTE — Therapy (Signed)
[place], [place], [address] Phone: [phone]   Fax:  [phone]  Physical Therapy Treatment  Patient Details  Name: Andres Fernandez MRN: [id number] Date of Birth: September 08, 1956 Referring Provider: Irene Limbo   Encounter Date: 01/24/2018  PT End of Session - 01/24/18 1240    Visit Number  6    Number of Visits  17    Date for PT Re-Evaluation  03/04/18    PT Start Time  1115 therapist ran late     PT Stop Time  1145    PT Time Calculation (min)  30 min    Activity Tolerance  Patient tolerated treatment well    Behavior During Therapy  [facility] for tasks assessed/performed       Past Medical History:  Diagnosis Date  . BPH (benign prostatic hyperplasia)   . Hepatitis C 11/27/2017  . Hypertension   . 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 Tobacco abuse     Past Surgical History:  Procedure Laterality Date  . APPENDECTOMY    . FEMUR IM NAIL Right 11/20/2017   Procedure: RIGHT FEMORAL INTRAMEDULLARY (IM) NAIL;  Surgeon: Nicholes Stairs, MD;  Location: [facility];  Service: Orthopedics;  Laterality: Right;    There were no vitals filed for this visit.  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  No/denies He still is having pain in right forearm but did not rate          [facility] PT Assessment - 01/24/18 0001      Strength   Right Hip Flexion  4/5    Right Knee Flexion  5/5      Transfers   Transfers  Sit to Stand    Sit to Stand  7: Independent no arm support       Standardized Balance Assessment   Standardized Balance  Assessment  -- 30 sec sit to stand: 12 reps- with use of UEs      Timed Up and Go Test   Normal TUG (seconds)  8.1                  [facility] Adult PT Treatment/Exercise - 01/24/18 0001      Knee/Hip Exercises: Stretches   Other Knee/Hip Stretches  sitting sideways on chair to stretch right quads     Other Knee/Hip Stretches  prone lying with green theraband around right foot to stretch right quads       Knee/Hip Exercises: Standing   Hip Flexion  Stengthening;Right;Left;5 reps no UE support,slowly, diffiuclty standing on RLE to move LLE      Knee/Hip Exercises: Prone   Hamstring Curl  10 reps               PT Short Term Goals - 01/24/18 1118      PT SHORT TERM GOAL #1   Title  Pt to be able to stand from chair with no use of UEs to decrease fall risk    Status  Achieved  PT SHORT TERM GOAL #2   Title  Pt to demonstrate 3+/5 right hip flexion to decrease fall risk    Baseline  3/5, 01/24/2018 4/5    Status  Achieved      PT SHORT TERM GOAL #3   Title  Pt to be able to sit to stand from chair 8 times in 30 seconds to decrease fall risk    Baseline  6 on eval , 01/24/2018 : 12     Status  Achieved        PT Long Term Goals - 01/24/18 1120      PT LONG TERM GOAL #1   Title  Pt to be independent in a home exercise program for continued strengthening and stretching    Time  8    Period  Weeks    Status  On-going      PT LONG TERM GOAL #2   Title  Pt to demonstrate 4/5 right hip flexion strength to decrease fall risk    Baseline  3/5  01/24/2018   4/5    Status  Achieved      PT LONG TERM GOAL #3   Title  Pt to demosntrate 4/5 right knee flexor strength to decrease risk of falls    Baseline  3/5   02/03/2018  5/5     Status  Achieved      PT LONG TERM GOAL #4   Title  Pt to be able to complete TUG in less than 30 seconds with no assistive device to decrease risk of falls    Baseline  17 seconds  on eval;  01/24/2018 :8.19 sec     Status  Achieved       PT LONG TERM GOAL #5   Title  Pt to be able to complete 14 sit to stands in 30 seconds to decrease risk of falls without use of UEs for support    Baseline  6 with use of UEs on eval,  01/24/2018 : 12 sit to stand in 30 sec     Status  On-going      PT LONG TERM GOAL #6   Title  Pt will be able to ambulate without use of cane to increase independence and allow pt to return to work    Baseline  01/24/2018 :pt uses straight cane  only uses it after he sits for a period of time     Time  8    Period  Weeks    Status  On-going            Plan - 01/24/18 1240    Clinical Impression Statement  Pt continues to make good improvments and has met several goals. He wants to return to work next month.  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 and stand on one leg for a time to step onto and off the machine     Clinical Impairments Affecting Rehab Potential  pt has degeneration of left femur bone as well    PT Frequency  2x / week    PT Treatment/Interventions  ADLs/Self Care Home Management;Gait training;Therapeutic exercise;Therapeutic activities;Patient/family education;Balance training;Neuromuscular re-education;Manual techniques;Manual lymph drainage;Passive range of motion;Taping;Orthotic Fit/Training    PT Next Visit Plan  Cont RLE strengthening exercises reviewing HEP - supine and sitting, give stretching for hip flexors/trunk.  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 Malnutrition of moderate degree  12/02/2017  . AKI (acute kidney injury) ([place]) 11/27/2017  . SIRS (systemic inflammatory response syndrome) ([place]) 11/27/2017  . Anemia 11/27/2017  . Thrombocytopenia ([place]) 11/27/2017  . Hepatitis C 11/27/2017  . Cancer associated pain   . Tumor lysis syndrome   . Encounter for antineoplastic chemotherapy   . 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 Pathological fracture in neoplastic disease, right femur, initial encounter for fracture ([place]) 11/23/2017  . Lytic bone lesion of right femur 11/20/2017  . Acute renal failure ([place]) 11/17/2017  . ?? Multiple myeloma vs other bone marrow malignancy 11/17/2017  . Hypercalcemia 11/17/2017  . BPH (benign prostatic hyperplasia) 11/17/2017  . Tobacco abuse 11/17/2017  . Elevated blood-pressure reading without diagnosis of hypertension 11/17/2017  . Lytic bone lesions on xray 11/17/2017   Donato Heinz. Owens Shark PT  Norwood Levo 01/24/2018, 12:46 PM  [place], [place], [address] Phone: [phone]   Fax:  [phone]  Name: Andres Fernandez MRN: [id number] Date of Birth: 1956-09-12

## 2018-01-25 ENCOUNTER — Ambulatory Visit: Payer: BLUE CROSS/BLUE SHIELD | Admitting: Hematology

## 2018-01-25 ENCOUNTER — Telehealth: Payer: Self-pay

## 2018-01-25 ENCOUNTER — Other Ambulatory Visit: Payer: BLUE CROSS/BLUE SHIELD

## 2018-01-25 NOTE — Telephone Encounter (Signed)
Called pt to ask about missed appt today. He said that he forgot. Scheduling message sent to add doctor appt to schedule on 02/01/18. Requested that scheduling staff call pt to let him know about any appt time changes.

## 2018-01-25 NOTE — Telephone Encounter (Signed)
Oral Oncology Patient Advocate Encounter  Received confirmation from [facility] that the patient will receive his initial shipment of cyclophosphamide on 01/29/2018  Fabio Asa. Melynda Keller, [place] Patient [place] [phone] 01/25/2018 3:12 PM

## 2018-01-28 ENCOUNTER — Ambulatory Visit: Payer: BLUE CROSS/BLUE SHIELD | Admitting: Hematology

## 2018-01-28 ENCOUNTER — Encounter: Payer: BLUE CROSS/BLUE SHIELD | Admitting: Physical Therapy

## 2018-01-28 ENCOUNTER — Other Ambulatory Visit: Payer: Self-pay | Admitting: Hematology

## 2018-01-28 ENCOUNTER — Ambulatory Visit: Payer: BLUE CROSS/BLUE SHIELD

## 2018-01-28 ENCOUNTER — Other Ambulatory Visit: Payer: BLUE CROSS/BLUE SHIELD

## 2018-01-30 ENCOUNTER — Encounter: Payer: BLUE CROSS/BLUE SHIELD | Admitting: Infectious Disease

## 2018-01-30 ENCOUNTER — Ambulatory Visit: Payer: BLUE CROSS/BLUE SHIELD

## 2018-01-30 DIAGNOSIS — M79651 Pain in right thigh: Secondary | ICD-10-CM

## 2018-01-30 DIAGNOSIS — M6281 Muscle weakness (generalized): Secondary | ICD-10-CM

## 2018-01-30 DIAGNOSIS — R262 Difficulty in walking, not elsewhere classified: Secondary | ICD-10-CM

## 2018-01-30 NOTE — Therapy (Signed)
[place], [place], [address] Phone: [phone]   Fax:  [phone]  Physical Therapy Treatment  Patient Details  Name: Andres Fernandez MRN: [id number] Date of Birth: 06/19/1956 Referring Provider: Irene Limbo   Encounter Date: 01/30/2018  PT End of Session - 01/30/18 1207    Visit Number  7    Number of Visits  17    Date for PT Re-Evaluation  03/04/18    PT Start Time  9169    PT Stop Time  1100    PT Time Calculation (min)  45 min    Activity Tolerance  Patient tolerated treatment well    Behavior During Therapy  [facility] for tasks assessed/performed       Past Medical History:  Diagnosis Date  . BPH (benign prostatic hyperplasia)   . Hepatitis C 11/27/2017  . Hypertension   . 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 Tobacco abuse     Past Surgical History:  Procedure Laterality Date  . APPENDECTOMY    . FEMUR IM NAIL Right 11/20/2017   Procedure: RIGHT FEMORAL INTRAMEDULLARY (IM) NAIL;  Surgeon: Nicholes Stairs, MD;  Location: [facility];  Service: Orthopedics;  Laterality: Right;    There were no vitals filed for this visit.  Subjective Assessment - 01/30/18 1017    Subjective  I am doing so much better now than when I started. My waist pain is almost nonexistent. The only time I feel it is briefly first thing in the monring but I just do my exercises and it goes away.  My Rt forearm is starting to feel better, I've just been doing ROM stretches with it. I'm able to sleep on my Rt side now as well.    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  No/denies                      [facility] Adult PT Treatment/Exercise - 01/30/18 0001      Knee/Hip Exercises: Stretches   Passive Hamstring Stretch  Right;Left;2  reps;20 seconds    Hip Flexor Stretch  Right;Left;2 reps;20 seconds Holding back of bike    Piriformis Stretch  Right;Left;2 reps;20 seconds Pt needed to hold Rt leg but was able to tolerate      Knee/Hip Exercises: Aerobic   Recumbent Bike  Level 1, 8 mins, 55-60 rpm throughout      Knee/Hip Exercises: Standing   Forward Lunges  Right;Left;2 sets;5 reps in treadmill    Forward Lunges Limitations  After demo and multiple VCs pt able to return correct demonstration. Pt reported this very challenging.    Forward Step Up  Right;Left;10 reps;Hand Hold: 1;Step Height: 6" with 3 sec SLS on step for prep for getting on forklift    Forward Step Up Limitations  After VCs for correct technique pt able to demonstrate without compensating. Rt LE able to tolerate SLS well and no pain with Rt step up. Lt LE tolerated all well . Pt reported this challenging.    Wall Squat  2 sets;10 reps wall taps on Airex, 2nd set with purple ball squeeze             PT Education - 01/30/18 1100    Education  provided  Yes    Education Details  Bil hip flexibility and lunges    Person(s) Educated  Patient    Methods  Explanation;Demonstration;Handout    Comprehension  Verbalized understanding;Returned demonstration       PT Short Term Goals - 01/24/18 1118      PT SHORT TERM GOAL #1   Title  Pt to be able to stand from chair with no use of UEs to decrease fall risk    Status  Achieved      PT SHORT TERM GOAL #2   Title  Pt to demonstrate 3+/5 right hip flexion to decrease fall risk    Baseline  3/5, 01/24/2018 4/5    Status  Achieved      PT SHORT TERM GOAL #3   Title  Pt to be able to sit to stand from chair 8 times in 30 seconds to decrease fall risk    Baseline  6 on eval , 01/24/2018 : 12     Status  Achieved        PT Long Term Goals - 01/24/18 1120      PT LONG TERM GOAL #1   Title  Pt to be independent in a home exercise program for continued strengthening and stretching    Time  8     Period  Weeks    Status  On-going      PT LONG TERM GOAL #2   Title  Pt to demonstrate 4/5 right hip flexion strength to decrease fall risk    Baseline  3/5  01/24/2018   4/5    Status  Achieved      PT LONG TERM GOAL #3   Title  Pt to demosntrate 4/5 right knee flexor strength to decrease risk of falls    Baseline  3/5   02/03/2018  5/5     Status  Achieved      PT LONG TERM GOAL #4   Title  Pt to be able to complete TUG in less than 30 seconds with no assistive device to decrease risk of falls    Baseline  17 seconds  on eval;  01/24/2018 :8.19 sec     Status  Achieved      PT LONG TERM GOAL #5   Title  Pt to be able to complete 14 sit to stands in 30 seconds to decrease risk of falls without use of UEs for support    Baseline  6 with use of UEs on eval,  01/24/2018 : 12 sit to stand in 30 sec     Status  On-going      PT LONG TERM GOAL #6   Title  Pt will be able to ambulate without use of cane to increase independence and allow pt to return to work    Baseline  01/24/2018 :pt uses straight cane  only uses it after he sits for a period of time     Time  8    Period  Weeks    Status  On-going            Plan - 01/30/18 1207    Clinical Impression Statement  Focused treatment today on LE strength that 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 Also bil hip flexixbility at end of session as pt reported LE muscle fatigue and this helps decrease his soreness after session. Pt is progressing very well overall and continue to encourage him to work HEP into his day so  that he is getting the most out of his recovery.    Rehab Potential  Good    Clinical Impairments Affecting Rehab Potential  pt has degeneration of left femur bone as well    PT Frequency  2x / week    PT Duration  8 weeks    PT Treatment/Interventions  ADLs/Self Care Home Management;Gait training;Therapeutic exercise;Therapeutic activities;Patient/family education;Balance training;Neuromuscular  re-education;Manual techniques;Manual lymph drainage;Passive range of motion;Taping;Orthotic Fit/Training    PT Next Visit Plan  Cont RLE strengthening exercises reviewing HEP - supine and sitting, give stretching for hip flexors/trunk.  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 Malnutrition of moderate degree 12/02/2017  . AKI (acute kidney injury) ([place]) 11/27/2017  . SIRS (systemic inflammatory response syndrome) ([place]) 11/27/2017  . Anemia 11/27/2017  . Thrombocytopenia ([place]) 11/27/2017  . Hepatitis C 11/27/2017  . Cancer associated pain   . Tumor lysis syndrome   . Encounter for antineoplastic chemotherapy   . 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 Pathological fracture in neoplastic disease, right femur, initial encounter for fracture ([place]) 11/23/2017  . Lytic bone lesion of right femur 11/20/2017  . Acute renal failure ([place]) 11/17/2017  . ?? Multiple myeloma vs other bone marrow malignancy 11/17/2017  . Hypercalcemia 11/17/2017  . BPH (benign prostatic hyperplasia) 11/17/2017  . Tobacco abuse 11/17/2017  . Elevated blood-pressure reading without diagnosis of hypertension 11/17/2017  . Lytic bone lesions on xray 11/17/2017    Otelia Limes, PTA 01/30/2018, 12:15 PM  [place], [place], [address] Phone: [phone]   Fax:  [phone]  Name: EISA NECAISE MRN: [id number] Date of Birth: 1956/11/01

## 2018-01-30 NOTE — Patient Instructions (Addendum)
Piriformis Stretch, Sitting    Cancer Rehab [phone]    Sit, one ankle on opposite knee, same-side hand on crossed knee. Push down on knee, keeping spine straight. Lean torso forward, with flat back, until tension is felt in hamstrings and gluteals of crossed-leg side. Hold _20-30__ seconds.  Repeat _3__ times per session. Do _2-3__ sessions per day.   Quads / HF, Standing    Stand, holding onto chair and grasping one foot with same-side hand. Pull heel toward buttock. Hold _20-30__ seconds.  Repeat _3__ times per session. Do _2-3__ sessions per day.   Lunge (Forward)    Hold onto solid surface like counter or dresser. Lunge forward on right leg. Be sure to bend back knee. Repeat _5-10_ times. Repeat with other leg for set. Do _2_ sets per session.

## 2018-01-31 ENCOUNTER — Encounter: Payer: Self-pay | Admitting: Physical Therapy

## 2018-01-31 ENCOUNTER — Ambulatory Visit: Payer: BLUE CROSS/BLUE SHIELD | Admitting: Physical Therapy

## 2018-01-31 DIAGNOSIS — M6281 Muscle weakness (generalized): Secondary | ICD-10-CM | POA: Diagnosis not present

## 2018-01-31 DIAGNOSIS — R262 Difficulty in walking, not elsewhere classified: Secondary | ICD-10-CM

## 2018-01-31 DIAGNOSIS — M79651 Pain in right thigh: Secondary | ICD-10-CM

## 2018-01-31 NOTE — Progress Notes (Signed)
Andres Fernandez Kitchen   HEMATOLOGY/ONCOLOGY OUTPATIENT PROGRESS NOTE  Date of Service: 01/31/18  CC: post hospitalization f/u for myeloma/plasma cell leukemia.  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 His labs today 12/14/2017 show hgb 8.3, platelets 524k. He is doing well overall. He has staples in his right upper leg following a recent surgery and has not been in touch with the surgeons for a f/u of this. He is not receiving physical therapy and is not getting around at home.  He notes that he was not scheduled for a post hospitalization nephrology f/u and has no PCP.  On review of systems, pt reports intermittent fatigue, SOB, pain to the left leg, dizziness, dry mouth and denies fever, chills, night sweats, dysuria and any other accompanying symptoms.   INTERVAL HISTORY:   Andres Fernandez presents today for follow up and C4D1 of treatment of his myeloma/plasma cell leukemia. He continues to respond well to treatment. He received his prescription for cytoxan this past Tuesday, 01/29/2018. He still is experiencing bilateral rib pain, but it has improved. The patient also endorses mild discomfort to his right hand s/p his recent Velcade injection. He adds that he has been able to move better since going to physical therapy. He is also wondering when he can go back to work. [name] reports that Viagra did not work for him and he is wondering if he can start using Trimix injections. He adds that before treatment he was using this medication without any issues. We discussed holding off on intra-penile injections for now due to risk of infection/bleeding.  On review of systems, pt endorses improving bilateral rib pain, and he reports gaining desired weight, eating well and denies neck pain, tingling in his toes, mouth sores and any other symptoms.   REVIEW OF SYSTEMS:    A 10+ POINT REVIEW OF SYSTEMS WAS OBTAINED including neurology, dermatology,  psychiatry, cardiac, respiratory, lymph, extremities, GI, GU, Musculoskeletal, constitutional, breasts, reproductive, HEENT.  All pertinent positives are noted in the HPI.  All others are negative.   MEDICAL HISTORY:  Past Medical History:  Diagnosis Date  . BPH (benign prostatic hyperplasia)   . Hepatitis C 11/27/2017  . Hypertension   . Plasma cell leukemia ([place]) 11/23/2017  . Tobacco abuse     SURGICAL HISTORY: Past Surgical History:  Procedure Laterality Date  . APPENDECTOMY    . FEMUR IM NAIL Right 11/20/2017   Procedure: RIGHT FEMORAL INTRAMEDULLARY (IM) NAIL;  Surgeon: Nicholes Stairs, MD;  Location: [facility];  Service: Orthopedics;  Laterality: Right;    SOCIAL HISTORY: Social History   Socioeconomic History  . Marital status: Single    Spouse name: Not on file  . Number of children: Not on file  . Years of education: Not on file  . Highest education level: Not on file  Social Needs  . Financial resource strain: Not on file  . Food insecurity - worry: Not on file  . Food insecurity - inability: Not on file  . Transportation needs - medical: Not on file  . Transportation needs - non-medical: Not on file  Occupational History  . Not on file  Tobacco Use  . Smoking status: Former Smoker    Packs/day: 0.50    Types: Cigarettes    Last attempt to quit: 11/09/2017    Years since quitting: 0.2  . Smokeless tobacco: Never Used  . Tobacco comment: no cigarettes x 1 week.  Substance and Sexual Activity  . Alcohol use: Yes  . Drug use: No  . Sexual activity: Not on file  Other Topics Concern  . Not on file  Social History Narrative  . Not on file    FAMILY HISTORY: Family History  Problem Relation Age of Onset  . COPD Mother   . Liver cancer Mother     ALLERGIES:  has No Known Allergies.  MEDICATIONS:  Scheduled Meds:  Continuous Infusions:  PRN Meds:.  PHYSICAL EXAMINATION:  VS reviewed Vitals:   02/01/18 0934  BP: (!) 156/98  Pulse: (!)  105  Resp: 18  Temp: 98.7 F (37.1 C)  SpO2: 100%     GENERAL:alert, in no acute distress and comfortable SKIN: no acute rashes, no significant lesions EYES: conjunctiva are pink and non-injected, sclera anicteric OROPHARYNX: MMM, no exudates, no oropharyngeal erythema or ulceration NECK: supple,  JVD mild we connected about that medication he gave me like that told him LYMPH:  no palpable lymphadenopathy in the cervical, axillary or inguinal regions LUNGS: no basal rales. HEART: regular rate & rhythm ABDOMEN:  normoactive bowel sounds , non tender, not distended. Extremity: no pedal edema PSYCH: alert & oriented x 3 with fluent speech NEURO: no focal motor/sensory deficits  LABORATORY DATA:  I have reviewed the data as listed  . CBC Latest Ref Rng & Units 02/01/2018 01/21/2018 01/18/2018  WBC 4.0 - 10.3 K/uL 9.6 11.8(H) 9.3  Hemoglobin 13.0 - 17.1 g/dL - - -  Hematocrit 38.4 - 49.9 % 34.9(L) 31.8(L) 32.5(L)  Platelets 140 - 400 K/uL 460(H) 342 458(H)  hgb 11.5 (significantly improved) . CBC    Component Value Date/Time   WBC 9.6 02/01/2018 0857   WBC 7.4 12/14/2017 1230   WBC 3.6 (L) 12/05/2017 1158   RBC 3.79 (L) 02/01/2018 0857   HGB 8.3 (L) 12/14/2017 1230   HCT 34.9 (L) 02/01/2018 0857   HCT 23.9 (L) 12/14/2017 1230   PLT 460 (H) 02/01/2018 0857   PLT 524 (H) 12/14/2017 1230   MCV 92.2 02/01/2018 0857   MCV 88.4 12/14/2017 1230   MCH 30.4 02/01/2018 0857   MCHC 32.9 02/01/2018 0857   RDW 16.6 (H) 02/01/2018 0857   RDW 15.3 (H) 12/14/2017 1230   LYMPHSABS 2.8 02/01/2018 0857   LYMPHSABS 1.4 12/14/2017 1230   MONOABS 0.9 02/01/2018 0857   MONOABS 0.6 12/14/2017 1230   EOSABS 0.1 02/01/2018 0857   EOSABS 0.1 12/14/2017 1230   BASOSABS 0.1 02/01/2018 0857   BASOSABS 0.1 12/14/2017 1230     . CMP Latest Ref Rng & Units 02/01/2018 01/21/2018 01/18/2018  Glucose 70 - 140 mg/dL 94 88 95  BUN 7 - 26 mg/dL _0 Creatinine 0.70 - 1.30 mg/dL 2.14(H) 1.77(H)  1.82(H)  Sodium 136 - 145 mmol/L 142 143 144  Potassium 3.5 - 5.1 mmol/L 5.1 4.0 4.2  Chloride 98 - 109 mmol/L 110(H) 113(H) 114(H)  CO2 22 - 29 mmol/L 22 19(L) 19(L)  Calcium 8.4 - 10.4 mg/dL 8.8 7.6(L) 7.0(L)  Total Protein 6.4 - 8.3 g/dL 7.2 6.2(L) 6.4  Total Bilirubin 0.2 - 1.2 mg/dL 0.3 0.2 0.3  Alkaline Phos 40 - 150 U/L 115 102 113  AST 5 - 34 U/L _1 ALT 0 - 55 U/L 33 27 29   Component     Latest Ref Rng & Units 11/17/2017 01/11/2018  IgG (Immunoglobin G), Serum     700 - 1,600 mg/dL  683 (L)  IgA     90 - 386 mg/dL  22 (L)  IgM (Immunoglobulin M), Srm     20 - 172 mg/dL  17 (L)  Total Protein ELP     6.0 - 8.5 g/dL 7.2 5.7 (L)  Albumin SerPl Elph-Mcnc     2.9 - 4.4 g/dL  2.9  Alpha 1     0.0 - 0.4 g/dL  0.2  Alpha2 Glob SerPl Elph-Mcnc     0.4 - 1.0 g/dL  1.1 (H)  B-Globulin SerPl Elph-Mcnc     0.7 - 1.3 g/dL  0.8  Gamma Glob SerPl Elph-Mcnc     0.4 - 1.8 g/dL  0.6  M Protein SerPl Elph-Mcnc     Not Observed g/dL  Not Observed  Globulin, Total     2.2 - 3.9 g/dL 3.5 2.8  Albumin/Glob SerPl     0.7 - 1.7  1.1  IFE 1       Comment  Please Note (HCV):       Comment  Albumin ELP     2.9 - 4.4 g/dL 3.7   Alpha-1-Globulin     0.0 - 0.4 g/dL 0.3   Alpha-2-Globulin     0.4 - 1.0 g/dL 1.3 (H)   Beta Globulin     0.7 - 1.3 g/dL 1.1   Gamma Globulin     0.4 - 1.8 g/dL 0.9   M-SPIKE, %     Not Observed g/dL Not Observed   SPE Interp.      Comment   Comment      Comment   A/G Ratio     0.7 - 1.7 1.1              RADIOGRAPHIC STUDIES: I have personally reviewed the radiological images as listed and agreed with the findings in the report. Dg Forearm Right  Result Date: 01/18/2018 CLINICAL DATA:  Right hand pain EXAM: RIGHT FOREARM - 2 VIEW COMPARISON:  None. FINDINGS: There is no evidence of fracture or other focal bone lesions. Soft tissues are unremarkable. IMPRESSION: Negative. Electronically Signed   By: Franchot Gallo M.D.   On:  01/18/2018 13:25   Dg Hand 2 View Right  Result Date: 01/18/2018 CLINICAL DATA:  Right hand pain EXAM: RIGHT HAND - 2 VIEW COMPARISON:  None. FINDINGS: Mild to moderate degenerative change in the radiocarpal joint with joint space narrowing. Cystic degenerative change in the lunate. No other arthropathy. No erosions. Negative for fracture IMPRESSION: Mild-to-moderate degenerative change in the radiocarpal joint. Electronically Signed   By: Franchot Gallo M.D.   On: 01/18/2018 13:26    ASSESSMENT & PLAN:   62 y.o.A male presenting with severe hypercalcemia, skeletal findings consistent with myeloma and significant leukocytosis/lymphocytosis in the peripheral bloodas well as AKI and hypercalcemia.  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 Appears to be responding to treatment.- with decreased kappa light chains.   BM Bx showed >95% involvement with kappa restricted serum free light chains. 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 MoL Cy-    PLAN -Advised the patient to remain hydrated with at least 48-60 ounces of water a day.   -Advised to start Cyclophosphamide 366m/m2 weekly with Dexamethasone from C4D1 use to treat myeloma more effectively.  -recounselled about the use of Cytoxan since he had multiple questions and concerns. -was recently  referred to Hep C clinic fro Mx.  -continue Acyclovir prophylaxis. -weekly labs on D1 AND D8 each cycle -Continue treatment as per orders -continue Xgeva q4weeks -Counseled pt to use a condom if he engages in sexual activity because his semen contains chemotherapy and hepatitis C, and to let his partner know that he has hepatitis C. -Referred to [facility] for consultation for myeloma transplant.  2) s/p Prophylactic IM nailing for rt  femur. -Advised that we will re-evaluate the patient returning to work by March on the next visit following Physical Therapy at the [facility].  -patient reports improvement   3) Thrombocytopenia resolved  4)Renal insufficiency -- likely primarily related to Myeloma -Kidney numbers improved. Creatinine in the 1.7-2.2 range  5) h/o tobacco abuse-counseled on smoking cessation -given prescription for nicotine patch  6) h/o cocaine and ETOH abuse -- sober for 3 yrs  7) Renal insuff- due to myeloma.  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 Some drop in hgb due to rt gluteal hematoma Plan hgb at 8.2 hgb at 8.1 as of 12/20/17 -hgb at 10.7 as of 12/24/17.  -Hgb at 9.6 as of 01/11/18 -hgb today 11.5 -will setup for PRBC transfusion as needed to maintain hgb>8   Please schedule C4 ,C5 and C6 of treatment RTC with Dr Irene Limbo C4D11 Continue labs D1 and D8 of each cycle Continue Xgeva q4weeks  . The total time spent in the appointment was 25 minutes and more than 50% was on counseling and direct patient cares.   Sullivan Lone MD [place] AAHIVMS [facility] Hematology/Oncology Physician [facility]  (Office):       [phone] (Work cell):  [phone] (Fax):           [phone]  This document serves as a record of services personally performed by Sullivan Lone, MD. It was created on his behalf by Margit Banda, a trained medical scribe. The creation of this record is based on the scribe's personal observations and the provider's statements to them.   .I have reviewed the above documentation for accuracy and completeness, and I agree with the above. Brunetta Genera MD MS

## 2018-01-31 NOTE — Patient Instructions (Signed)
Warrior I    Do not ben upper back backward.  Belly button to spine, keep shoulders down,  Raise both arms overhead, palms facing inward     In wide stride, rotate back leg out 20, grounding foot, hands in prayer position in front of chest. Bend front leg 90. Reaching over head, look up. Hold for _5___ breaths. Repeat, other leg forward. BEGINNER: Support body with hands on hips.   Copyright  VHI. All rights reserved.

## 2018-01-31 NOTE — Therapy (Signed)
[place], [place], [address] Phone: [phone]   Fax:  [phone]  Physical Therapy Treatment  Patient Details  Name: Andres Fernandez MRN: [id number] Date of Birth: 03/03/1956 Referring Provider: Irene Limbo   Encounter Date: 01/31/2018  PT End of Session - 01/31/18 1233    Visit Number  8    Number of Visits  17    PT Start Time  6962    PT Stop Time  1100    PT Time Calculation (min)  45 min    Activity Tolerance  Patient tolerated treatment well    Behavior During Therapy  [facility] for tasks assessed/performed       Past Medical History:  Diagnosis Date  . BPH (benign prostatic hyperplasia)   . Hepatitis C 11/27/2017  . Hypertension   . 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 Tobacco abuse     Past Surgical History:  Procedure Laterality Date  . APPENDECTOMY    . FEMUR IM NAIL Right 11/20/2017   Procedure: RIGHT FEMORAL INTRAMEDULLARY (IM) NAIL;  Surgeon: Nicholes Stairs, MD;  Location: [facility];  Service: Orthopedics;  Laterality: Right;    There were no vitals filed for this visit.  Subjective Assessment - 01/31/18 1018    Subjective  Pt is doing better today, Even his arm pain is better, it is now only off and on.  He decided not to go to the orthopedic doctor because his arm was better,  But then, today, it was hurting when he woke up     Pertinent History  plasma cell leukemia without having achieved remission with pathologic fx of R femur, Hepatitis C    Patient Stated Goals  to get back to where I once was and with the energy I had    Currently in Pain?  No/denies                      [facility] Adult PT Treatment/Exercise - 01/31/18 0001      High Level Balance   High Level Balance Activities  Backward walking;Other (comment)    High Level Balance Comments  walking on toes for 15 feet       Balance Poses: Yoga   Warrior I  1 rep;Other (comment) 10 seconds, cues to  keep shoulders down and breathe      Lumbar Exercises: Supine   Dead Bug  5 reps    Dead Bug Limitations  cues for technique.  pt able to keep abdominals engages, no back pain     Other Supine Lumbar Exercises  one leg up at time to table top, 5 sec holds x 5 reps       Knee/Hip Exercises: Stretches   Hip Flexor Stretch  Right;30 seconds supine with right leg off of slightly elevated mat       Knee/Hip Exercises: Aerobic   Recumbent Bike  Level 2, 8 mins, 55-60 rpm throughout      Knee/Hip Exercises: Standing   Other Standing Knee Exercises  red theraband around distal thigh, 2 sets of 5 reps of step out and retrun on each leg alternating  pt needed rest break after 5 reps     Other Standing Knee Exercises  SLS, then other leg point to 12, 3, 6 and then knee up 5 reps with each leg       Knee/Hip Exercises: Supine   Short Arc Quad Sets  Strengthening;Right;10 reps cues to  raise, hold for 5 and lower slowly.     Straight Leg Raises  Strengthening;Right;Left 4 part SLR with cues for technique    Other Supine Knee/Hip Exercises  pt only able to do 3 reps of SLR on right and 5 reps on left       Knee/Hip Exercises: Sidelying   Hip ABduction  Strengthening;Right;10 reps    Hip ABduction Limitations  pt needed PT to raise leg, then he could hold and lower slowly x 10 reps              PT Education - 01/31/18 1232    Education provided  Yes    Education Details  straight leg raises, standing side step with theraband around thighs, warrior 1 stretch     Person(s) Educated  Patient    Methods  Explanation;Demonstration;Handout    Comprehension  Verbalized understanding;Returned demonstration       PT Short Term Goals - 01/24/18 1118      PT SHORT TERM GOAL #1   Title  Pt to be able to stand from chair with no use of UEs to decrease fall risk    Status  Achieved      PT SHORT TERM GOAL #2   Title  Pt to demonstrate 3+/5 right hip flexion to decrease fall risk    Baseline   3/5, 01/24/2018 4/5    Status  Achieved      PT SHORT TERM GOAL #3   Title  Pt to be able to sit to stand from chair 8 times in 30 seconds to decrease fall risk    Baseline  6 on eval , 01/24/2018 : 12     Status  Achieved        PT Long Term Goals - 01/24/18 1120      PT LONG TERM GOAL #1   Title  Pt to be independent in a home exercise program for continued strengthening and stretching    Time  8    Period  Weeks    Status  On-going      PT LONG TERM GOAL #2   Title  Pt to demonstrate 4/5 right hip flexion strength to decrease fall risk    Baseline  3/5  01/24/2018   4/5    Status  Achieved      PT LONG TERM GOAL #3   Title  Pt to demosntrate 4/5 right knee flexor strength to decrease risk of falls    Baseline  3/5   02/03/2018  5/5     Status  Achieved      PT LONG TERM GOAL #4   Title  Pt to be able to complete TUG in less than 30 seconds with no assistive device to decrease risk of falls    Baseline  17 seconds  on eval;  01/24/2018 :8.19 sec     Status  Achieved      PT LONG TERM GOAL #5   Title  Pt to be able to complete 14 sit to stands in 30 seconds to decrease risk of falls without use of UEs for support    Baseline  6 with use of UEs on eval,  01/24/2018 : 12 sit to stand in 30 sec     Status  On-going      PT LONG TERM GOAL #6   Title  Pt will be able to ambulate without use of cane to increase independence and allow pt to return to work  Baseline  01/24/2018 :pt uses straight cane  only uses it after he sits for a period of time     Time  8    Period  Weeks    Status  On-going            Plan - 01/31/18 1233    Clinical Impression Statement  Pt reports a little fatigue as he just had PT yesteday, but was able to participate.  He continues to demonstrate bilateral hip weakness, R>L as he has trouble with SLR and sidelying right hip abduction. Added some abdominal work and different activities today  He reports functional improvement as he is now able to  walk most places without his cane.  He needs continued skilled PT for specific activites to focus on hip strengthening     Rehab Potential  Good    Clinical Impairments Affecting Rehab Potential  pt has degeneration of left femur bone as well    PT Frequency  2x / week    PT Duration  8 weeks    PT Treatment/Interventions  ADLs/Self Care Home Management;Gait training;Therapeutic exercise;Therapeutic activities;Patient/family education;Balance training;Neuromuscular re-education;Manual techniques;Manual lymph drainage;Passive range of motion;Taping;Orthotic Fit/Training    PT Next Visit Plan  Cont RLE strengthening exercises reviewing HEP - supine and sitting, give stretching for hip flexors/trunk.  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 Plasma cell leukemia not having achieved remission ([place]) 12/26/2017  . Malnutrition of moderate degree 12/02/2017  . AKI (acute kidney injury) ([place]) 11/27/2017  . SIRS (systemic inflammatory response syndrome) ([place]) 11/27/2017  . Anemia 11/27/2017  . Thrombocytopenia ([place]) 11/27/2017  . Hepatitis C 11/27/2017  . Cancer associated pain   . Tumor lysis syndrome   . Encounter for antineoplastic chemotherapy   . Plasma cell leukemia ([place]) 11/23/2017  . Pathological fracture in neoplastic disease, right femur, initial encounter for fracture ([place]) 11/23/2017  . Lytic  bone lesion of right femur 11/20/2017  . Acute renal failure ([place]) 11/17/2017  . ?? Multiple myeloma vs other bone marrow malignancy 11/17/2017  . Hypercalcemia 11/17/2017  . BPH (benign prostatic hyperplasia) 11/17/2017  . Tobacco abuse 11/17/2017  . Elevated blood-pressure reading without diagnosis of hypertension 11/17/2017  . Lytic bone lesions on xray 11/17/2017   Donato Heinz. Owens Shark PT  Norwood Levo 01/31/2018, 12:37 PM  [place], [place], [address] Phone: [phone]   Fax:  [phone]  Name: Andres Fernandez MRN: [id number] Date of Birth: 27-Feb-1956

## 2018-02-01 ENCOUNTER — Inpatient Hospital Stay: Payer: BLUE CROSS/BLUE SHIELD

## 2018-02-01 ENCOUNTER — Telehealth: Payer: Self-pay

## 2018-02-01 ENCOUNTER — Encounter: Payer: Self-pay | Admitting: Hematology

## 2018-02-01 ENCOUNTER — Other Ambulatory Visit: Payer: BLUE CROSS/BLUE SHIELD

## 2018-02-01 ENCOUNTER — Ambulatory Visit: Payer: BLUE CROSS/BLUE SHIELD

## 2018-02-01 ENCOUNTER — Inpatient Hospital Stay (HOSPITAL_BASED_OUTPATIENT_CLINIC_OR_DEPARTMENT_OTHER): Payer: BLUE CROSS/BLUE SHIELD | Admitting: Hematology

## 2018-02-01 ENCOUNTER — Other Ambulatory Visit: Payer: Self-pay

## 2018-02-01 VITALS — BP 156/98 | HR 105 | Temp 98.7°F | Resp 18 | Ht 68.0 in | Wt 158.0 lb

## 2018-02-01 DIAGNOSIS — C9010 Plasma cell leukemia not having achieved remission: Secondary | ICD-10-CM

## 2018-02-01 DIAGNOSIS — R42 Dizziness and giddiness: Secondary | ICD-10-CM

## 2018-02-01 DIAGNOSIS — Z9889 Other specified postprocedural states: Secondary | ICD-10-CM | POA: Diagnosis not present

## 2018-02-01 DIAGNOSIS — C9000 Multiple myeloma not having achieved remission: Secondary | ICD-10-CM

## 2018-02-01 DIAGNOSIS — R5383 Other fatigue: Secondary | ICD-10-CM | POA: Diagnosis not present

## 2018-02-01 DIAGNOSIS — N179 Acute kidney failure, unspecified: Secondary | ICD-10-CM | POA: Diagnosis not present

## 2018-02-01 DIAGNOSIS — B1920 Unspecified viral hepatitis C without hepatic coma: Secondary | ICD-10-CM

## 2018-02-01 DIAGNOSIS — D638 Anemia in other chronic diseases classified elsewhere: Secondary | ICD-10-CM | POA: Diagnosis not present

## 2018-02-01 DIAGNOSIS — I10 Essential (primary) hypertension: Secondary | ICD-10-CM

## 2018-02-01 DIAGNOSIS — N400 Enlarged prostate without lower urinary tract symptoms: Secondary | ICD-10-CM

## 2018-02-01 DIAGNOSIS — R682 Dry mouth, unspecified: Secondary | ICD-10-CM | POA: Diagnosis not present

## 2018-02-01 DIAGNOSIS — F17210 Nicotine dependence, cigarettes, uncomplicated: Secondary | ICD-10-CM

## 2018-02-01 DIAGNOSIS — R0602 Shortness of breath: Secondary | ICD-10-CM | POA: Diagnosis not present

## 2018-02-01 DIAGNOSIS — Z87891 Personal history of nicotine dependence: Secondary | ICD-10-CM

## 2018-02-01 LAB — CBC WITH DIFFERENTIAL (CANCER CENTER ONLY)
BASOS PCT: 1 %
Basophils Absolute: 0.1 10*3/uL (ref 0.0–0.1)
Eosinophils Absolute: 0.1 10*3/uL (ref 0.0–0.5)
Eosinophils Relative: 1 %
HEMATOCRIT: 34.9 % — AB (ref 38.4–49.9)
HEMOGLOBIN: 11.5 g/dL — AB (ref 13.0–17.1)
LYMPHS ABS: 2.8 10*3/uL (ref 0.9–3.3)
Lymphocytes Relative: 29 %
MCH: 30.4 pg (ref 27.2–33.4)
MCHC: 32.9 g/dL (ref 32.0–36.0)
MCV: 92.2 fL (ref 79.3–98.0)
MONO ABS: 0.9 10*3/uL (ref 0.1–0.9)
MONOS PCT: 9 %
NEUTROS ABS: 5.8 10*3/uL (ref 1.5–6.5)
Neutrophils Relative %: 60 %
Platelet Count: 460 10*3/uL — ABNORMAL HIGH (ref 140–400)
RBC: 3.79 MIL/uL — ABNORMAL LOW (ref 4.20–5.82)
RDW: 16.6 % — AB (ref 11.0–14.6)
WBC Count: 9.6 10*3/uL (ref 4.0–10.3)

## 2018-02-01 LAB — CMP (CANCER CENTER ONLY)
ALBUMIN: 3.8 g/dL (ref 3.5–5.0)
ALT: 33 U/L (ref 0–55)
ANION GAP: 10 (ref 3–11)
AST: 25 U/L (ref 5–34)
Alkaline Phosphatase: 115 U/L (ref 40–150)
BUN: 23 mg/dL (ref 7–26)
CHLORIDE: 110 mmol/L — AB (ref 98–109)
CO2: 22 mmol/L (ref 22–29)
Calcium: 8.8 mg/dL (ref 8.4–10.4)
Creatinine: 2.14 mg/dL — ABNORMAL HIGH (ref 0.70–1.30)
GFR, Est AFR Am: 37 mL/min — ABNORMAL LOW (ref >60)
GFR, Estimated: 32 mL/min — ABNORMAL LOW (ref >60)
GLUCOSE: 94 mg/dL (ref 70–140)
Potassium: 5.1 mmol/L (ref 3.5–5.1)
SODIUM: 142 mmol/L (ref 136–145)
Total Bilirubin: 0.3 mg/dL (ref 0.2–1.2)
Total Protein: 7.2 g/dL (ref 6.4–8.3)

## 2018-02-01 MED ORDER — BORTEZOMIB CHEMO SQ INJECTION 3.5 MG (2.5MG/ML)
1.3000 mg/m2 | Freq: Once | INTRAMUSCULAR | Status: AC
  Administered 2018-02-01: 2.25 mg via SUBCUTANEOUS
  Filled 2018-02-01: qty 2.25

## 2018-02-01 MED ORDER — ONDANSETRON HCL 8 MG PO TABS
ORAL_TABLET | ORAL | Status: AC
  Filled 2018-02-01: qty 1

## 2018-02-01 MED ORDER — ONDANSETRON HCL 8 MG PO TABS
4.0000 mg | ORAL_TABLET | Freq: Once | ORAL | Status: AC
  Administered 2018-02-01: 11:00:00 via ORAL

## 2018-02-01 NOTE — Telephone Encounter (Signed)
Left a detailed voice mail, and ask patient that when he come in on Monday that he ask for a new updated schedule of his calender. Per 2/22 los additional appointments made.

## 2018-02-01 NOTE — Progress Notes (Signed)
Dr. Irene Limbo okay to tx with Ctn 2.14.

## 2018-02-01 NOTE — Telephone Encounter (Signed)
Called patient to let him know that his letter to return to work has been completed, outlining the restrictions required for him to go back to work. Additionally, pt will not need to come in for lab work on Monday. Lab appointment cancelled, infusion appointment remaining at 0930 on 02/04/18. Pt verbalized understanding.

## 2018-02-01 NOTE — Patient Instructions (Signed)
Cancer Center Discharge Instructions for Patients Receiving Chemotherapy  Today you received the following chemotherapy agents:  Velcade (bortezomib)  To help prevent nausea and vomiting after your treatment, we encourage you to take your nausea medication as prescribed.   If you develop nausea and vomiting that is not controlled by your nausea medication, call the clinic.   BELOW ARE SYMPTOMS THAT SHOULD BE REPORTED IMMEDIATELY:  *FEVER GREATER THAN 100.5 F  *CHILLS WITH OR WITHOUT FEVER  NAUSEA AND VOMITING THAT IS NOT CONTROLLED WITH YOUR NAUSEA MEDICATION  *UNUSUAL SHORTNESS OF BREATH  *UNUSUAL BRUISING OR BLEEDING  TENDERNESS IN MOUTH AND THROAT WITH OR WITHOUT PRESENCE OF ULCERS  *URINARY PROBLEMS  *BOWEL PROBLEMS  UNUSUAL RASH Items with * indicate a potential emergency and should be followed up as soon as possible.  Feel free to call the clinic should you have any questions or concerns. The clinic phone number is [phone].  Please show the CHEMO ALERT CARD at check-in to the Emergency Department and triage nurse.   

## 2018-02-02 ENCOUNTER — Other Ambulatory Visit: Payer: Self-pay | Admitting: Hematology

## 2018-02-04 ENCOUNTER — Other Ambulatory Visit: Payer: BLUE CROSS/BLUE SHIELD

## 2018-02-04 ENCOUNTER — Ambulatory Visit (HOSPITAL_COMMUNITY): Payer: BLUE CROSS/BLUE SHIELD | Admitting: Psychiatry

## 2018-02-04 ENCOUNTER — Inpatient Hospital Stay: Payer: BLUE CROSS/BLUE SHIELD

## 2018-02-04 VITALS — BP 149/88 | HR 92 | Temp 98.2°F | Resp 18

## 2018-02-04 DIAGNOSIS — C9010 Plasma cell leukemia not having achieved remission: Secondary | ICD-10-CM

## 2018-02-04 MED ORDER — ONDANSETRON HCL 8 MG PO TABS
4.0000 mg | ORAL_TABLET | Freq: Once | ORAL | Status: AC
  Administered 2018-02-04: 4 mg via ORAL

## 2018-02-04 MED ORDER — ONDANSETRON HCL 8 MG PO TABS
ORAL_TABLET | ORAL | Status: AC
  Filled 2018-02-04: qty 1

## 2018-02-04 MED ORDER — BORTEZOMIB CHEMO SQ INJECTION 3.5 MG (2.5MG/ML)
1.3000 mg/m2 | Freq: Once | INTRAMUSCULAR | Status: AC
Start: 2018-02-04 — End: 2018-02-04
  Administered 2018-02-04: 2.25 mg via SUBCUTANEOUS
  Filled 2018-02-04: qty 2.25

## 2018-02-04 NOTE — Patient Instructions (Signed)
[facility] Discharge Instructions for Patients Receiving Chemotherapy  Today you received the following chemotherapy agents:  Velcade (bortezomib)  To help prevent nausea and vomiting after your treatment, we encourage you to take your nausea medication as prescribed.   If you develop nausea and vomiting that is not controlled by your nausea medication, call the clinic.   BELOW ARE SYMPTOMS THAT SHOULD BE REPORTED IMMEDIATELY:  *FEVER GREATER THAN 100.5 F  *CHILLS WITH OR WITHOUT FEVER  NAUSEA AND VOMITING THAT IS NOT CONTROLLED WITH YOUR NAUSEA MEDICATION  *UNUSUAL SHORTNESS OF BREATH  *UNUSUAL BRUISING OR BLEEDING  TENDERNESS IN MOUTH AND THROAT WITH OR WITHOUT PRESENCE OF ULCERS  *URINARY PROBLEMS  *BOWEL PROBLEMS  UNUSUAL RASH Items with * indicate a potential emergency and should be followed up as soon as possible.  Feel free to call the clinic should you have any questions or concerns. The clinic phone number is [phone].  Please show the CHEMO ALERT CARD at check-in to the Emergency Department and triage nurse.   

## 2018-02-05 ENCOUNTER — Ambulatory Visit: Payer: BLUE CROSS/BLUE SHIELD

## 2018-02-05 DIAGNOSIS — M79651 Pain in right thigh: Secondary | ICD-10-CM

## 2018-02-05 DIAGNOSIS — M6281 Muscle weakness (generalized): Secondary | ICD-10-CM

## 2018-02-05 DIAGNOSIS — R262 Difficulty in walking, not elsewhere classified: Secondary | ICD-10-CM

## 2018-02-05 NOTE — Therapy (Signed)
[place], [place], [address] Phone: [phone]   Fax:  [phone]  Physical Therapy Treatment  Patient Details  Name: Andres Fernandez MRN: [id number] Date of Birth: [birth year] Referring Provider: Irene Limbo   Encounter Date: 02/05/2018  PT End of Session - 02/05/18 1041    Visit Number  9    Number of Visits  17    Date for PT Re-Evaluation  03/04/18    PT Start Time  1018    PT Stop Time  1100    PT Time Calculation (min)  42 min    Activity Tolerance  Patient tolerated treatment well    Behavior During Therapy  [facility] for tasks assessed/performed       Past Medical History:  Diagnosis Date  . BPH (benign prostatic hyperplasia)   . Hepatitis C 11/27/2017  . Hypertension   . 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 Tobacco abuse     Past Surgical History:  Procedure Laterality Date  . APPENDECTOMY    . FEMUR IM NAIL Right 11/20/2017   Procedure: RIGHT FEMORAL INTRAMEDULLARY (IM) NAIL;  Surgeon: Nicholes Stairs, MD;  Location: [facility];  Service: Orthopedics;  Laterality: Right;    There were no vitals filed for this visit.  Subjective Assessment - 02/05/18 1026    Subjective  Keep doing better. My doctor cleared me to go back to work 3 days/week but I haven't taken it to my company yet. Still deciding if I feel ready.     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  No/denies                      [facility] Adult PT Treatment/Exercise - 02/05/18 0001      Knee/Hip Exercises: Aerobic   Recumbent Bike  Level 2, 6 mins (pt reported fatigue and needing to stop after 6 mins today), 60-65 rpm throughout      Knee/Hip Exercises: Standing   Forward Lunges  Right;Left;2 sets;10 reps in treadmill for +2 HHA     Forward Lunges Limitations  Still very challenging for pt but he was able to return god demonstration of this today.    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    Wall Squat  2 sets;10 reps On [employer] for wall tap    Wall Squat Limitations  VCs and demonstration for correct LE alignment      Knee/Hip Exercises: Sidelying   Hip ABduction  Strengthening;Right;Left;2 sets;10 reps    Clams  Bil 10 x each               PT Short Term Goals - 01/24/18 1118      PT SHORT TERM GOAL #1   Title  Pt to be able to stand from chair with no use of UEs to decrease fall risk    Status  Achieved      PT SHORT TERM GOAL #2   Title  Pt to demonstrate 3+/5 right hip flexion to decrease fall risk    Baseline  3/5, 01/24/2018  4/5    Status  Achieved      PT SHORT TERM GOAL #3   Title  Pt to be able to sit to stand from chair 8 times in 30 seconds to decrease fall risk    Baseline  6 on eval , 01/24/2018 : 12     Status  Achieved        PT Long Term Goals - 01/24/18 1120      PT LONG TERM GOAL #1   Title  Pt to be independent in a home exercise program for continued strengthening and stretching    Time  8    Period  Weeks    Status  On-going      PT LONG TERM GOAL #2   Title  Pt to demonstrate 4/5 right hip flexion strength to decrease fall risk    Baseline  3/5  01/24/2018   4/5    Status  Achieved      PT LONG TERM GOAL #3   Title  Pt to demosntrate 4/5 right knee flexor strength to decrease risk of falls    Baseline  3/5   02/03/2018  5/5     Status  Achieved      PT LONG TERM GOAL #4   Title  Pt to be able to complete TUG in less than 30 seconds with no assistive device to decrease risk of falls    Baseline  17 seconds  on eval;  01/24/2018 :8.19 sec     Status  Achieved      PT LONG TERM GOAL #5    Title  Pt to be able to complete 14 sit to stands in 30 seconds to decrease risk of falls without use of UEs for support    Baseline  6 with use of UEs on eval,  01/24/2018 : 12 sit to stand in 30 sec     Status  On-going      PT LONG TERM GOAL #6   Title  Pt will be able to ambulate without use of cane to increase independence and allow pt to return to work    Baseline  01/24/2018 :pt uses straight cane  only uses it after he sits for a period of time     Time  8    Period  Weeks    Status  On-going            Plan - 02/05/18 1042    Clinical Impression Statement  Continued with focus to bil LE/hip strengthening and flexibility for pt to feel strong enough to return to work. He reports feeling stronger, more steady and with increased endurance and strength every week, though especially with therapy.     Rehab Potential  Good    Clinical Impairments Affecting Rehab Potential  pt has degeneration of left femur bone as well    PT Frequency  2x / week    PT Duration  8 weeks    PT Treatment/Interventions  ADLs/Self Care Home Management;Gait training;Therapeutic exercise;Therapeutic activities;Patient/family education;Balance training;Neuromuscular re-education;Manual techniques;Manual lymph drainage;Passive range of motion;Taping;Orthotic Fit/Training    PT Next Visit Plan  Cont RLE strengthening exercises reviewing HEP - supine and sitting, give stretching for hip flexors/trunk.  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 Malnutrition of moderate degree 12/02/2017  . AKI (acute kidney injury) ([place]) 11/27/2017  . SIRS (systemic inflammatory response syndrome) ([place]) 11/27/2017  . Anemia 11/27/2017  . Thrombocytopenia ([place]) 11/27/2017  . Hepatitis C 11/27/2017  . Cancer associated pain   . Tumor lysis syndrome   . Encounter for antineoplastic chemotherapy   . 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 Pathological fracture in neoplastic disease, right femur, initial encounter for fracture ([place]) 11/23/2017  . Lytic bone lesion of right femur 11/20/2017  . Acute renal failure ([place]) 11/17/2017  . ?? Multiple myeloma vs other bone marrow malignancy 11/17/2017  . Hypercalcemia 11/17/2017  . BPH (benign prostatic hyperplasia) 11/17/2017  . Tobacco abuse 11/17/2017  . Elevated blood-pressure reading without diagnosis of hypertension 11/17/2017  . Lytic bone lesions on xray 11/17/2017    Otelia Limes, PTA 02/05/2018, 11:04 AM  [place], [place], [address] Phone: [phone]   Fax:  [phone]  Name: KENDRYCK LACROIX MRN: [id number] Date of Birth: [birth year]

## 2018-02-06 ENCOUNTER — Telehealth: Payer: Self-pay

## 2018-02-06 NOTE — Telephone Encounter (Signed)
Scheduling message sent to add missing infusion appt on 3/4 after MD appt. Additionally requested for appointments for cycle 6 to be scheduled on 4/5, 4/8, 4/12, and 4/15.

## 2018-02-07 ENCOUNTER — Other Ambulatory Visit: Payer: Self-pay

## 2018-02-07 ENCOUNTER — Ambulatory Visit: Payer: BLUE CROSS/BLUE SHIELD

## 2018-02-07 DIAGNOSIS — M6281 Muscle weakness (generalized): Secondary | ICD-10-CM | POA: Diagnosis not present

## 2018-02-07 DIAGNOSIS — R262 Difficulty in walking, not elsewhere classified: Secondary | ICD-10-CM

## 2018-02-07 DIAGNOSIS — M79651 Pain in right thigh: Secondary | ICD-10-CM

## 2018-02-07 DIAGNOSIS — C9010 Plasma cell leukemia not having achieved remission: Secondary | ICD-10-CM

## 2018-02-07 NOTE — Progress Notes (Signed)
.   HEMATOLOGY/ONCOLOGY OUTPATIENT PROGRESS NOTE  Date of Service: 02/11/18  CC:   F/u for continued evaluation and management of Myeloma/Plasma cell leukemia not in remission  HISTORY OF PRESENTING ILLNESS:  Pt is being seen as outpatient today for hospital fu of his plasma cell leukemia. His labs today 12/14/2017 show hgb 8.3, platelets 524k. He is doing well overall. He has staples in his right upper leg following a recent surgery and has not been in touch with the surgeons for a f/u of this. He is not receiving physical therapy and is not getting around at home.  He notes that he was not scheduled for a post hospitalization nephrology f/u and has no PCP.  On review of systems, pt reports intermittent fatigue, SOB, pain to the left leg, dizziness, dry mouth and denies fever, chills, night sweats, dysuria and any other accompanying symptoms.   INTERVAL HISTORY:   Andres Fernandez presents today for follow up and C4D11 of treatment of his myeloma/plasma cell leukemia. The patient's last visit with us was on 02/01/18. The pt reports that he is doing well overall. He notes that he is tolerating the Cytoxan well at this time. He notes that he is trying to drink lots of water. He reports that his job will be letting him work for 3 days a week now.   Of note since the patient's last visit, patient's right arm is feeling much better and he is now able to make a fist.   Most recent lab results (02/08/18) of CBC, CMP is as follows: all values are WNL except for RBC at 3.94, Hgb at 12.0, HCT at 374, RDW at 16.2, Platelets at 452k, Monocytes Abs at 1.0k, Potassium at 5.4, CO2 at 21, BUN at 30, Creatinine at 2.07.   He notes that he has not been seen in the Hepatitis C clinic yet but will go later today.   On review of systems, pt reports good energy levels, and denies tingling, numbness, and any other symptoms.   REVIEW OF SYSTEMS:    .10 Point review of Systems was done is negative except as noted  above.  MEDICAL HISTORY:  Past Medical History:  Diagnosis Date  . BPH (benign prostatic hyperplasia)   . Hepatitis C 11/27/2017  . Hypertension   . 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 Tobacco abuse     SURGICAL HISTORY: Past Surgical History:  Procedure Laterality Date  . APPENDECTOMY    . FEMUR IM NAIL Right 11/20/2017   Procedure: RIGHT FEMORAL INTRAMEDULLARY (IM) NAIL;  Surgeon: Rogers, Jason Patrick, MD;  Location: [facility];  Service: Orthopedics;  Laterality: Right;    SOCIAL HISTORY: Social History   Socioeconomic History  . Marital status: Single    Spouse name: Not on file  . Number of children: Not on file  . Years of education: Not on file  . Highest education level: Not on file  Social Needs  . Financial resource strain: Not on file  . Food insecurity - worry: Not on file  . Food insecurity - inability: Not on file  . Transportation needs - medical: Not on file  . Transportation needs - non-medical: Not on file  Occupational History  . Not on file  Tobacco Use  . Smoking status: Former Smoker    Packs/day: 0.50    Types: Cigarettes    Last attempt to quit: 11/09/2017    Years since quitting: 0.2  . Smokeless tobacco: Never Used  . Tobacco   comment: no cigarettes x 1 week.  Substance and Sexual Activity  . Alcohol use: Yes  . Drug use: No  . Sexual activity: Not on file  Other Topics Concern  . Not on file  Social History Narrative  . Not on file    FAMILY HISTORY: Family History  Problem Relation Age of Onset  . COPD Mother   . Liver cancer Mother     ALLERGIES:  has No Known Allergies.  MEDICATIONS:  . Current Outpatient Medications on File Prior to Visit  Medication Sig Dispense Refill  . acyclovir (ZOVIRAX) 400 MG tablet Take 1 tablet (400 mg total) by mouth daily. 30 tablet 6  . cholecalciferol (VITAMIN D) 400 units TABS tablet Take 400 Units by mouth daily.    . cyclophosphamide (CYTOXAN) 50 MG capsule Take 11 caps (550mg) by  mouth once weekly on days 1, 8, &15, every 21 days. Take with breakfast, maintain hydration. 33 capsule 2  . dexamethasone (DECADRON) 4 MG tablet TAKE 5 TABLETS (20 MG TOTAL) BY MOUTH ONCE A WEEK. EVERY SATURDAY MORNING 20 tablet 1  . nicotine (NICOTINE STEP 2) 14 mg/24hr patch Place 1 patch (14 mg total) onto the skin daily. 28 patch 1  . ondansetron (ZOFRAN) 4 MG tablet Take 2 tablets (8 mg total) by mouth every 8 (eight) hours as needed for nausea. 30 tablet 0   No current facility-administered medications on file prior to visit.     PHYSICAL EXAMINATION:  VS reviewed Vitals:   02/11/18 1330  BP: 139/89  Pulse: 87  Resp: 20  Temp: 98.6 F (37 C)  SpO2: 100%    . GENERAL:alert, in no acute distress and comfortable SKIN: no acute rashes, no significant lesions EYES: conjunctiva are pink and non-injected, sclera anicteric OROPHARYNX: MMM, no exudates, no oropharyngeal erythema or ulceration NECK: supple, no JVD LYMPH:  no palpable lymphadenopathy in the cervical, axillary or inguinal regions LUNGS: clear to auscultation b/l with normal respiratory effort HEART: regular rate & rhythm ABDOMEN:  normoactive bowel sounds , non tender, not distended. Extremity: no pedal edema PSYCH: alert & oriented x 3 with fluent speech NEURO: no focal motor/sensory deficits    LABORATORY DATA:  I have reviewed the data as listed  . CBC Latest Ref Rng & Units 02/08/2018 02/01/2018 01/21/2018  WBC 4.0 - 10.3 K/uL 8.0 9.6 11.8(H)  Hemoglobin 13.0 - 17.1 g/dL - - -  Hematocrit 38.4 - 49.9 % 36.4(L) 34.9(L) 31.8(L)  Platelets 140 - 400 K/uL 452(H) 460(H) 342  hgb 12 (significantly improved) . CBC    Component Value Date/Time   WBC 8.0 02/08/2018 0841   WBC 7.4 12/14/2017 1230   WBC 3.6 (L) 12/05/2017 1158   RBC 3.94 (L) 02/08/2018 0841   HGB 8.3 (L) 12/14/2017 1230   HCT 36.4 (L) 02/08/2018 0841   HCT 23.9 (L) 12/14/2017 1230   PLT 452 (H) 02/08/2018 0841   PLT 524 (H) 12/14/2017 1230    MCV 92.3 02/08/2018 0841   MCV 88.4 12/14/2017 1230   MCH 30.4 02/08/2018 0841   MCHC 32.9 02/08/2018 0841   RDW 16.2 (H) 02/08/2018 0841   RDW 15.3 (H) 12/14/2017 1230   LYMPHSABS 2.7 02/08/2018 0841   LYMPHSABS 1.4 12/14/2017 1230   MONOABS 1.0 (H) 02/08/2018 0841   MONOABS 0.6 12/14/2017 1230   EOSABS 0.0 02/08/2018 0841   EOSABS 0.1 12/14/2017 1230   BASOSABS 0.0 02/08/2018 0841   BASOSABS 0.1 12/14/2017 1230     . CMP   Latest Ref Rng & Units 02/08/2018 02/01/2018 01/21/2018  Glucose 70 - 140 mg/dL 89 94 88  BUN 7 - 26 mg/dL 30(H) 23 19  Creatinine 0.70 - 1.30 mg/dL 2.07(H) 2.14(H) 1.77(H)  Sodium 136 - 145 mmol/L 140 142 143  Potassium 3.5 - 5.1 mmol/L 5.4(H) 5.1 4.0  Chloride 98 - 109 mmol/L 109 110(H) 113(H)  CO2 22 - 29 mmol/L 21(L) 22 19(L)  Calcium 8.4 - 10.4 mg/dL 8.9 8.8 7.6(L)  Total Protein 6.4 - 8.3 g/dL 7.0 7.2 6.2(L)  Total Bilirubin 0.2 - 1.2 mg/dL 0.3 0.3 0.2  Alkaline Phos 40 - 150 U/L 110 115 102  AST 5 - 34 U/L 34 25 22  ALT 0 - 55 U/L 46 33 27   Component     Latest Ref Rng & Units 11/17/2017 01/11/2018  IgG (Immunoglobin G), Serum     700 - 1,600 mg/dL  683 (L)  IgA     90 - 386 mg/dL  22 (L)  IgM (Immunoglobulin M), Srm     20 - 172 mg/dL  17 (L)  Total Protein ELP     6.0 - 8.5 g/dL 7.2 5.7 (L)  Albumin SerPl Elph-Mcnc     2.9 - 4.4 g/dL  2.9  Alpha 1     0.0 - 0.4 g/dL  0.2  Alpha2 Glob SerPl Elph-Mcnc     0.4 - 1.0 g/dL  1.1 (H)  B-Globulin SerPl Elph-Mcnc     0.7 - 1.3 g/dL  0.8  Gamma Glob SerPl Elph-Mcnc     0.4 - 1.8 g/dL  0.6  M Protein SerPl Elph-Mcnc     Not Observed g/dL  Not Observed  Globulin, Total     2.2 - 3.9 g/dL 3.5 2.8  Albumin/Glob SerPl     0.7 - 1.7  1.1  IFE 1       Comment  Please Note (HCV):       Comment  Albumin ELP     2.9 - 4.4 g/dL 3.7   Alpha-1-Globulin     0.0 - 0.4 g/dL 0.3   Alpha-2-Globulin     0.4 - 1.0 g/dL 1.3 (H)   Beta Globulin     0.7 - 1.3 g/dL 1.1   Gamma Globulin     0.4 - 1.8 g/dL  0.9   M-SPIKE, %     Not Observed g/dL Not Observed   SPE Interp.      Comment   Comment      Comment   A/G Ratio     0.7 - 1.7 1.1              RADIOGRAPHIC STUDIES: I have personally reviewed the radiological images as listed and agreed with the findings in the report. Dg Forearm Right  Result Date: 01/18/2018 CLINICAL DATA:  Right hand pain EXAM: RIGHT FOREARM - 2 VIEW COMPARISON:  None. FINDINGS: There is no evidence of fracture or other focal bone lesions. Soft tissues are unremarkable. IMPRESSION: Negative. Electronically Signed   By: Franchot Gallo M.D.   On: 01/18/2018 13:25   Dg Hand 2 View Right  Result Date: 01/18/2018 CLINICAL DATA:  Right hand pain EXAM: RIGHT HAND - 2 VIEW COMPARISON:  None. FINDINGS: Mild to moderate degenerative change in the radiocarpal joint with joint space narrowing. Cystic degenerative change in the lunate. No other arthropathy. No erosions. Negative for fracture IMPRESSION: Mild-to-moderate degenerative change in the radiocarpal joint. Electronically Signed   By: Franchot Gallo M.D.  On: 01/18/2018 13:26    ASSESSMENT & PLAN:   62 y.o.A male presenting with severe hypercalcemia, skeletal findings consistent with myeloma and significant leukocytosis/lymphocytosis in the peripheral blood as well as AKI and hypercalcemia.  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 Appears to be responding to treatment.- with decreased kappa light chains.   BM Bx showed >95% involvement with kappa restricted serum free light chains. 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 MoL Cy-    PLAN -labs stable - discussed with patient -continue CyBorD treatment as per orders. -counseled to maintain compliance with f/u to Hep C clinic for evaluation and mx of  recently noted Hep C -- this could potentially be a transplant consideration as well. -continue Acyclovir prophylaxis. -weekly labs on D1 AND D8 each cycle -Continue treatment as per orders -continue Xgeva q4weeks  2) s/p Prophylactic IM nailing for rt femur. -Advised that we will re-evaluate the patient returning to work by March on the next visit following Physical Therapy at the [facility].  -patient reports improvement and is planning to return to work part time.  3) Thrombocytopenia resolved  4)Renal insufficiency -- likely primarily related to Myeloma -Kidney numbers improved. 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  7) Renal insuff- due to myeloma.  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 Some drop in hgb due to rt gluteal hematoma Plan hgb at 8.2 hgb at 8.1 as of 12/20/17 -hgb at 10.7 as of 12/24/17.  -Hgb at 9.6 as of 01/11/18 -Hgb 02/11/18 is at 12.0 -monitor esp with addition of Cytoxan  -f/u with Hep C Clinic as per previous referral -continue CyBorD chemotherapy as per orders. Plz schedule C5 and C6 of treatment. -RTC with Dr Irene Limbo on C5D4 in 2 weeks -labs D1 and D8 of every cycle  . The total time spent in the appointment was 25 minutes and more than 50% was on counseling and direct patient cares.    Sullivan Lone MD [place] AAHIVMS [facility] Hematology/Oncology Physician [facility]  (Office):       [phone] (Work cell):  [phone] (Fax):           [phone]  This document serves as a record of services personally performed by Sullivan Lone, MD. It was  created on his behalf by Baldwin Jamaica, a trained medical scribe. The creation of this record is based on the scribe's personal observations and the provider's statements to them.   .I have reviewed the above documentation for accuracy and completeness, and I agree with the above. Brunetta Genera MD MS

## 2018-02-07 NOTE — Therapy (Addendum)
[place], [place], [address] Phone: [phone]   Fax:  [phone]  Physical Therapy Treatment  Patient Details  Name: Andres Fernandez MRN: [id number] Date of Birth: [birth year] Referring Provider: Irene Limbo   Encounter Date: 02/07/2018  PT End of Session - 02/07/18 1158    Visit Number  10    Number of Visits  17    Date for PT Re-Evaluation  03/04/18    PT Start Time  1107    PT Stop Time  1157    PT Time Calculation (min)  50 min    Activity Tolerance  Patient tolerated treatment well    Behavior During Therapy  [facility] for tasks assessed/performed       Past Medical History:  Diagnosis Date  . BPH (benign prostatic hyperplasia)   . Hepatitis C 11/27/2017  . Hypertension   . 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 Tobacco abuse     Past Surgical History:  Procedure Laterality Date  . APPENDECTOMY    . FEMUR IM NAIL Right 11/20/2017   Procedure: RIGHT FEMORAL INTRAMEDULLARY (IM) NAIL;  Surgeon: Nicholes Stairs, MD;  Location: [facility];  Service: Orthopedics;  Laterality: Right;    There were no vitals filed for this visit.  Subjective Assessment - 02/07/18 1114    Subjective  I felt good after last visit. I get tired right after but I know it's because I worked hard.     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  No/denies                      [facility] Adult PT Treatment/Exercise - 02/07/18 0001      Knee/Hip Exercises: Stretches   Passive Hamstring Stretch  Right;Left;2 reps;20 seconds Seated in chair    Hip Flexor Stretch  Right;Left;2 reps;20 seconds At back of bike    Piriformis Stretch  Right;Left;2 reps;20 seconds;Limitations Seated in chair, demo t remind pt of technique    Piriformis  Stretch Limitations  Pt able to do with Rt hip but needs to support knee to decrease pain.     Gastroc Stretch  Right;Left;2 reps;20 seconds Using ProStretch at back of bike      Knee/Hip Exercises: Aerobic   Elliptical  Level 1, Incline 6 x2 mins, very challenging for pt    Recumbent Bike  Level 2, x 7 mins      Knee/Hip Exercises: Standing   Forward Lunges  Right;Left;2 sets;10 reps In treadmill for +2 HHA    Forward Lunges Limitations  Still very challenging for pt but he was able to return good demonstration of this today.    Forward Step Up  Right;Left;2 sets;10 reps;Hand Hold: 1;Step Height: 8"    Forward Step Up Limitations  VCs to decrease hip hike, then able to return correct demonstration    Wall Squat  2 sets;10 reps On Airex for wall tap with 3 lb OH press    Wall Squat Limitations  VCs and demonstration for correct technique    Other Standing Knee Exercises  Performed above exercises in a circuit, 1 set of 10 each then repeat, seated rest between set.  PT Short Term Goals - 01/24/18 1118      PT SHORT TERM GOAL #1   Title  Pt to be able to stand from chair with no use of UEs to decrease fall risk    Status  Achieved      PT SHORT TERM GOAL #2   Title  Pt to demonstrate 3+/5 right hip flexion to decrease fall risk    Baseline  3/5, 01/24/2018 4/5    Status  Achieved      PT SHORT TERM GOAL #3   Title  Pt to be able to sit to stand from chair 8 times in 30 seconds to decrease fall risk    Baseline  6 on eval , 01/24/2018 : 12     Status  Achieved        PT Long Term Goals - 01/24/18 1120      PT LONG TERM GOAL #1   Title  Pt to be independent in a home exercise program for continued strengthening and stretching    Time  8    Period  Weeks    Status  On-going      PT LONG TERM GOAL #2   Title  Pt to demonstrate 4/5 right hip flexion strength to decrease fall risk    Baseline  3/5  01/24/2018   4/5    Status  Achieved      PT LONG TERM  GOAL #3   Title  Pt to demosntrate 4/5 right knee flexor strength to decrease risk of falls    Baseline  3/5   02/03/2018  5/5     Status  Achieved      PT LONG TERM GOAL #4   Title  Pt to be able to complete TUG in less than 30 seconds with no assistive device to decrease risk of falls    Baseline  17 seconds  on eval;  01/24/2018 :8.19 sec     Status  Achieved      PT LONG TERM GOAL #5   Title  Pt to be able to complete 14 sit to stands in 30 seconds to decrease risk of falls without use of UEs for support    Baseline  6 with use of UEs on eval,  01/24/2018 : 12 sit to stand in 30 sec     Status  On-going      PT LONG TERM GOAL #6   Title  Pt will be able to ambulate without use of cane to increase independence and allow pt to return to work    Baseline  01/24/2018 :pt uses straight cane  only uses it after he sits for a period of time     Time  8    Period  Weeks    Status  On-going            Plan - 02/07/18 1159    Clinical Impression Statement  Had pt perform LE strengthening exercises in a cicuit today which he tolerated very well. Progression of step up to 8 in and added 3 lb OH press to squats. Pt with LE fatigue by end of session and required seated rest breaks during, but overall tolerated session today very well and he was encouraged by this.  He wants to return to work soon, but not until he feels strong enough to do so safely and he feels PT has been helping him to progress towards this.     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     Consulted and Agree with Plan of Care  Patient       Patient will benefit from skilled therapeutic intervention in order to improve the following deficits and impairments:  Decreased balance, Abnormal gait, Decreased endurance, Decreased mobility, Difficulty walking, Increased edema, Decreased activity tolerance, Decreased strength, Pain  Visit Diagnosis: Pain in right thigh  Muscle weakness (generalized)  Difficulty in walking, not elsewhere classified     Problem List Patient Active Problem List   Diagnosis Date Noted  . 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 Malnutrition of moderate degree 12/02/2017  . AKI (acute kidney injury) ([place]) 11/27/2017  . SIRS (systemic inflammatory response syndrome) ([place]) 11/27/2017  . Anemia 11/27/2017  . Thrombocytopenia ([place]) 11/27/2017  . Hepatitis C 11/27/2017  . Cancer associated pain   . Tumor lysis syndrome   . Encounter for antineoplastic chemotherapy   . 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 Pathological fracture in neoplastic disease, right femur, initial encounter for fracture ([place]) 11/23/2017  . Lytic bone lesion of right femur 11/20/2017  . Acute renal failure ([place]) 11/17/2017  . ?? Multiple myeloma vs other bone marrow malignancy 11/17/2017  . Hypercalcemia 11/17/2017  . BPH (benign prostatic hyperplasia) 11/17/2017  . Tobacco abuse 11/17/2017  . Elevated blood-pressure reading without diagnosis of hypertension 11/17/2017  . Lytic bone lesions on xray 11/17/2017    Otelia Limes, PTA 02/07/2018, 12:04 PM  [place], [place], [address] Phone: [phone]   Fax:  [phone]  Name: AZIAH BROSTROM MRN: [id number] Date of Birth:  20-Oct-1956  PHYSICAL THERAPY DISCHARGE SUMMARY  Visits from Start of Care:10  Current functional level related to goals / functional outcomes: unknown   Remaining deficits: unknown   Education / Equipment: Home exercise  Plan: Patient agrees to discharge.  Patient goals were partially met. Patient is being discharged due to not returning since the last visit.  ?????    Donato Heinz. Owens Shark, PT

## 2018-02-08 ENCOUNTER — Inpatient Hospital Stay: Payer: BLUE CROSS/BLUE SHIELD

## 2018-02-08 ENCOUNTER — Inpatient Hospital Stay: Payer: BLUE CROSS/BLUE SHIELD | Attending: Hematology

## 2018-02-08 VITALS — BP 146/96 | HR 89 | Temp 98.2°F | Resp 17

## 2018-02-08 DIAGNOSIS — R682 Dry mouth, unspecified: Secondary | ICD-10-CM | POA: Diagnosis not present

## 2018-02-08 DIAGNOSIS — C9010 Plasma cell leukemia not having achieved remission: Secondary | ICD-10-CM

## 2018-02-08 DIAGNOSIS — Z9889 Other specified postprocedural states: Secondary | ICD-10-CM | POA: Insufficient documentation

## 2018-02-08 DIAGNOSIS — I10 Essential (primary) hypertension: Secondary | ICD-10-CM | POA: Insufficient documentation

## 2018-02-08 DIAGNOSIS — Z79899 Other long term (current) drug therapy: Secondary | ICD-10-CM | POA: Insufficient documentation

## 2018-02-08 DIAGNOSIS — N179 Acute kidney failure, unspecified: Secondary | ICD-10-CM | POA: Diagnosis not present

## 2018-02-08 DIAGNOSIS — N400 Enlarged prostate without lower urinary tract symptoms: Secondary | ICD-10-CM | POA: Insufficient documentation

## 2018-02-08 DIAGNOSIS — B1920 Unspecified viral hepatitis C without hepatic coma: Secondary | ICD-10-CM | POA: Insufficient documentation

## 2018-02-08 DIAGNOSIS — C9000 Multiple myeloma not having achieved remission: Secondary | ICD-10-CM | POA: Diagnosis not present

## 2018-02-08 DIAGNOSIS — D630 Anemia in neoplastic disease: Secondary | ICD-10-CM | POA: Insufficient documentation

## 2018-02-08 DIAGNOSIS — R42 Dizziness and giddiness: Secondary | ICD-10-CM | POA: Insufficient documentation

## 2018-02-08 DIAGNOSIS — M899 Disorder of bone, unspecified: Secondary | ICD-10-CM | POA: Diagnosis not present

## 2018-02-08 DIAGNOSIS — Z808 Family history of malignant neoplasm of other organs or systems: Secondary | ICD-10-CM | POA: Insufficient documentation

## 2018-02-08 DIAGNOSIS — M84551A Pathological fracture in neoplastic disease, right femur, initial encounter for fracture: Secondary | ICD-10-CM

## 2018-02-08 DIAGNOSIS — R5383 Other fatigue: Secondary | ICD-10-CM | POA: Diagnosis not present

## 2018-02-08 DIAGNOSIS — F17210 Nicotine dependence, cigarettes, uncomplicated: Secondary | ICD-10-CM | POA: Insufficient documentation

## 2018-02-08 DIAGNOSIS — Z87891 Personal history of nicotine dependence: Secondary | ICD-10-CM | POA: Diagnosis not present

## 2018-02-08 DIAGNOSIS — N289 Disorder of kidney and ureter, unspecified: Secondary | ICD-10-CM | POA: Diagnosis not present

## 2018-02-08 LAB — CBC WITH DIFFERENTIAL (CANCER CENTER ONLY)
BASOS ABS: 0 10*3/uL (ref 0.0–0.1)
Basophils Relative: 1 %
Eosinophils Absolute: 0 10*3/uL (ref 0.0–0.5)
Eosinophils Relative: 1 %
HCT: 36.4 % — ABNORMAL LOW (ref 38.4–49.9)
Hemoglobin: 12 g/dL — ABNORMAL LOW (ref 13.0–17.1)
LYMPHS PCT: 33 %
Lymphs Abs: 2.7 10*3/uL (ref 0.9–3.3)
MCH: 30.4 pg (ref 27.2–33.4)
MCHC: 32.9 g/dL (ref 32.0–36.0)
MCV: 92.3 fL (ref 79.3–98.0)
MONO ABS: 1 10*3/uL — AB (ref 0.1–0.9)
MONOS PCT: 12 %
NEUTROS ABS: 4.3 10*3/uL (ref 1.5–6.5)
Neutrophils Relative %: 53 %
Platelet Count: 452 10*3/uL — ABNORMAL HIGH (ref 140–400)
RBC: 3.94 MIL/uL — ABNORMAL LOW (ref 4.20–5.82)
RDW: 16.2 % — AB (ref 11.0–14.6)
WBC Count: 8 10*3/uL (ref 4.0–10.3)

## 2018-02-08 LAB — CMP (CANCER CENTER ONLY)
ALT: 46 U/L (ref 0–55)
AST: 34 U/L (ref 5–34)
Albumin: 3.8 g/dL (ref 3.5–5.0)
Alkaline Phosphatase: 110 U/L (ref 40–150)
Anion gap: 10 (ref 3–11)
BUN: 30 mg/dL — AB (ref 7–26)
CO2: 21 mmol/L — ABNORMAL LOW (ref 22–29)
CREATININE: 2.07 mg/dL — AB (ref 0.70–1.30)
Calcium: 8.9 mg/dL (ref 8.4–10.4)
Chloride: 109 mmol/L (ref 98–109)
GFR, Est AFR Am: 38 mL/min — ABNORMAL LOW (ref >60)
GFR, Estimated: 33 mL/min — ABNORMAL LOW (ref >60)
Glucose, Bld: 89 mg/dL (ref 70–140)
POTASSIUM: 5.4 mmol/L — AB (ref 3.5–5.1)
Sodium: 140 mmol/L (ref 136–145)
Total Bilirubin: 0.3 mg/dL (ref 0.2–1.2)
Total Protein: 7 g/dL (ref 6.4–8.3)

## 2018-02-08 MED ORDER — DENOSUMAB 120 MG/1.7ML ~~LOC~~ SOLN
120.0000 mg | Freq: Once | SUBCUTANEOUS | Status: AC
  Administered 2018-02-08: 120 mg via SUBCUTANEOUS

## 2018-02-08 MED ORDER — BORTEZOMIB CHEMO SQ INJECTION 3.5 MG (2.5MG/ML)
1.3000 mg/m2 | Freq: Once | INTRAMUSCULAR | Status: AC
  Administered 2018-02-08: 2.25 mg via SUBCUTANEOUS
  Filled 2018-02-08: qty 2.25

## 2018-02-08 MED ORDER — DENOSUMAB 120 MG/1.7ML ~~LOC~~ SOLN
SUBCUTANEOUS | Status: AC
  Filled 2018-02-08: qty 1.7

## 2018-02-08 MED ORDER — ONDANSETRON HCL 8 MG PO TABS
4.0000 mg | ORAL_TABLET | Freq: Once | ORAL | Status: AC
  Administered 2018-02-08: 4 mg via ORAL

## 2018-02-08 MED ORDER — ONDANSETRON HCL 8 MG PO TABS
ORAL_TABLET | ORAL | Status: AC
  Filled 2018-02-08: qty 1

## 2018-02-08 NOTE — Progress Notes (Signed)
Per Dr. Irene Limbo, okay to treat pt with abnormal Crt of 2.07 and K of 5.4

## 2018-02-08 NOTE — Patient Instructions (Signed)
[facility] Discharge Instructions for Patients Receiving Chemotherapy  Today you received the following chemotherapy agents:  Velcade (bortezomib) and Xgeva  To help prevent nausea and vomiting after your treatment, we encourage you to take your nausea medication as prescribed.   If you develop nausea and vomiting that is not controlled by your nausea medication, call the clinic.   BELOW ARE SYMPTOMS THAT SHOULD BE REPORTED IMMEDIATELY:  *FEVER GREATER THAN 100.5 F  *CHILLS WITH OR WITHOUT FEVER  NAUSEA AND VOMITING THAT IS NOT CONTROLLED WITH YOUR NAUSEA MEDICATION  *UNUSUAL SHORTNESS OF BREATH  *UNUSUAL BRUISING OR BLEEDING  TENDERNESS IN MOUTH AND THROAT WITH OR WITHOUT PRESENCE OF ULCERS  *URINARY PROBLEMS  *BOWEL PROBLEMS  UNUSUAL RASH Items with * indicate a potential emergency and should be followed up as soon as possible.  Feel free to call the clinic should you have any questions or concerns. The clinic phone number is [phone].  Please show the [place] at check-in to the Emergency Department and triage nurse.  Denosumab injection Delton See) What is this medicine? DENOSUMAB (den oh sue mab) slows bone breakdown. Prolia is used to treat osteoporosis in women after menopause and in men. Delton See is used to treat a high calcium level due to cancer and to prevent bone fractures and other bone problems caused by multiple myeloma or cancer bone metastases. Delton See is also used to treat giant cell tumor of the bone. This medicine may be used for other purposes; ask your health care provider or pharmacist if you have questions. COMMON BRAND NAME(S): Prolia, XGEVA What should I tell my health care provider before I take this medicine? They need to know if you have any of these conditions: -dental disease -having surgery or tooth extraction -infection -kidney disease -low levels of calcium or Vitamin D in the blood -malnutrition -on  hemodialysis -skin conditions or sensitivity -thyroid or parathyroid disease -an unusual reaction to denosumab, other medicines, foods, dyes, or preservatives -pregnant or trying to get pregnant -breast-feeding How should I use this medicine? This medicine is for injection under the skin. It is given by a health care professional in a hospital or clinic setting. If you are getting Prolia, a special MedGuide will be given to you by the pharmacist with each prescription and refill. Be sure to read this information carefully each time. For Prolia, talk to your pediatrician regarding the use of this medicine in children. Special care may be needed. For Delton See, talk to your pediatrician regarding the use of this medicine in children. While this drug may be prescribed for children as young as 13 years for selected conditions, precautions do apply. Overdosage: If you think you have taken too much of this medicine contact a poison control center or emergency room at once. NOTE: This medicine is only for you. Do not share this medicine with others. What if I miss a dose? It is important not to miss your dose. Call your doctor or health care professional if you are unable to keep an appointment. What may interact with this medicine? Do not take this medicine with any of the following medications: -other medicines containing denosumab This medicine may also interact with the following medications: -medicines that lower your chance of fighting infection -steroid medicines like prednisone or cortisone This list may not describe all possible interactions. Give your health care provider a list of all the medicines, herbs, non-prescription drugs, or dietary supplements you use. Also tell them if you smoke,  drink alcohol, or use illegal drugs. Some items may interact with your medicine. What should I watch for while using this medicine? Visit your doctor or health care professional for regular checks on your  progress. Your doctor or health care professional may order blood tests and other tests to see how you are doing. Call your doctor or health care professional for advice if you get a fever, chills or sore throat, or other symptoms of a cold or flu. Do not treat yourself. This drug may decrease your body's ability to fight infection. Try to avoid being around people who are sick. You should make sure you get enough calcium and vitamin D while you are taking this medicine, unless your doctor tells you not to. Discuss the foods you eat and the vitamins you take with your health care professional. See your dentist regularly. Brush and floss your teeth as directed. Before you have any dental work done, tell your dentist you are receiving this medicine. Do not become pregnant while taking this medicine or for 5 months after stopping it. Talk with your doctor or health care professional about your birth control options while taking this medicine. Women should inform their doctor if they wish to become pregnant or think they might be pregnant. There is a potential for serious side effects to an unborn child. Talk to your health care professional or pharmacist for more information. What side effects may I notice from receiving this medicine? Side effects that you should report to your doctor or health care professional as soon as possible: -allergic reactions like skin rash, itching or hives, swelling of the face, lips, or tongue -bone pain -breathing problems -dizziness -jaw pain, especially after dental work -redness, blistering, peeling of the skin -signs and symptoms of infection like fever or chills; cough; sore throat; pain or trouble passing urine -signs of low calcium like fast heartbeat, muscle cramps or muscle pain; pain, tingling, numbness in the hands or feet; seizures -unusual bleeding or bruising -unusually weak or tired Side effects that usually do not require medical attention (report to your  doctor or health care professional if they continue or are bothersome): -constipation -diarrhea -headache -joint pain -loss of appetite -muscle pain -runny nose -tiredness -upset stomach This list may not describe all possible side effects. Call your doctor for medical advice about side effects. You may report side effects to FDA at 1-800-FDA-1088. Where should I keep my medicine? This medicine is only given in a clinic, doctor's office, or other health care setting and will not be stored at home. NOTE: This sheet is a summary. It may not cover all possible information. If you have questions about this medicine, talk to your doctor, pharmacist, or health care provider.  2018 Elsevier/Gold Standard (2016-12-19 19:17:21)

## 2018-02-11 ENCOUNTER — Encounter: Payer: Self-pay | Admitting: Hematology

## 2018-02-11 ENCOUNTER — Inpatient Hospital Stay: Payer: BLUE CROSS/BLUE SHIELD

## 2018-02-11 ENCOUNTER — Ambulatory Visit: Payer: BLUE CROSS/BLUE SHIELD | Attending: Hematology | Admitting: Physical Therapy

## 2018-02-11 ENCOUNTER — Inpatient Hospital Stay (HOSPITAL_BASED_OUTPATIENT_CLINIC_OR_DEPARTMENT_OTHER): Payer: BLUE CROSS/BLUE SHIELD | Admitting: Hematology

## 2018-02-11 VITALS — BP 139/89 | HR 87 | Temp 98.6°F | Resp 20 | Ht 68.0 in | Wt 159.8 lb

## 2018-02-11 DIAGNOSIS — F17210 Nicotine dependence, cigarettes, uncomplicated: Secondary | ICD-10-CM

## 2018-02-11 DIAGNOSIS — C9010 Plasma cell leukemia not having achieved remission: Secondary | ICD-10-CM

## 2018-02-11 DIAGNOSIS — D630 Anemia in neoplastic disease: Secondary | ICD-10-CM

## 2018-02-11 DIAGNOSIS — B1920 Unspecified viral hepatitis C without hepatic coma: Secondary | ICD-10-CM | POA: Diagnosis not present

## 2018-02-11 DIAGNOSIS — M84551A Pathological fracture in neoplastic disease, right femur, initial encounter for fracture: Secondary | ICD-10-CM

## 2018-02-11 DIAGNOSIS — C9000 Multiple myeloma not having achieved remission: Secondary | ICD-10-CM

## 2018-02-11 DIAGNOSIS — N289 Disorder of kidney and ureter, unspecified: Secondary | ICD-10-CM | POA: Diagnosis not present

## 2018-02-11 MED ORDER — ONDANSETRON HCL 8 MG PO TABS
4.0000 mg | ORAL_TABLET | Freq: Once | ORAL | Status: AC
  Administered 2018-02-11: 4 mg via ORAL

## 2018-02-11 MED ORDER — PROCHLORPERAZINE MALEATE 10 MG PO TABS
ORAL_TABLET | ORAL | Status: AC
  Filled 2018-02-11: qty 1

## 2018-02-11 MED ORDER — BORTEZOMIB CHEMO SQ INJECTION 3.5 MG (2.5MG/ML)
1.3000 mg/m2 | Freq: Once | INTRAMUSCULAR | Status: AC
  Administered 2018-02-11: 2.25 mg via SUBCUTANEOUS
  Filled 2018-02-11: qty 2.25

## 2018-02-11 MED ORDER — ONDANSETRON HCL 8 MG PO TABS
ORAL_TABLET | ORAL | Status: AC
  Filled 2018-02-11: qty 1

## 2018-02-11 NOTE — Progress Notes (Signed)
CBC/CMET labs 3/1 reviewed with MD verboal order " ok to treat despite labs"

## 2018-02-11 NOTE — Patient Instructions (Signed)
[facility] Discharge Instructions for Patients Receiving Chemotherapy  Today you received the following chemotherapy agents:  Velcade (bortezomib).  To help prevent nausea and vomiting after your treatment, we encourage you to take your nausea medication as prescribed.   If you develop nausea and vomiting that is not controlled by your nausea medication, call the clinic.   BELOW ARE SYMPTOMS THAT SHOULD BE REPORTED IMMEDIATELY:  *FEVER GREATER THAN 100.5 F  *CHILLS WITH OR WITHOUT FEVER  NAUSEA AND VOMITING THAT IS NOT CONTROLLED WITH YOUR NAUSEA MEDICATION  *UNUSUAL SHORTNESS OF BREATH  *UNUSUAL BRUISING OR BLEEDING  TENDERNESS IN MOUTH AND THROAT WITH OR WITHOUT PRESENCE OF ULCERS  *URINARY PROBLEMS  *BOWEL PROBLEMS  UNUSUAL RASH Items with * indicate a potential emergency and should be followed up as soon as possible.  Feel free to call the clinic should you have any questions or concerns. The clinic phone number is [phone].  Please show the CHEMO ALERT CARD at check-in to the Emergency Department and triage nurse.  Denosumab injection (Xgeva) What is this medicine? DENOSUMAB (den oh sue mab) slows bone breakdown. Prolia is used to treat osteoporosis in women after menopause and in men. Xgeva is used to treat a high calcium level due to cancer and to prevent bone fractures and other bone problems caused by multiple myeloma or cancer bone metastases. Xgeva is also used to treat giant cell tumor of the bone. This medicine may be used for other purposes; ask your health care provider or pharmacist if you have questions. COMMON BRAND NAME(S): Prolia, XGEVA What should I tell my health care provider before I take this medicine? They need to know if you have any of these conditions: -dental disease -having surgery or tooth extraction -infection -kidney disease -low levels of calcium or Vitamin D in the blood -malnutrition -on hemodialysis -skin  conditions or sensitivity -thyroid or parathyroid disease -an unusual reaction to denosumab, other medicines, foods, dyes, or preservatives -pregnant or trying to get pregnant -breast-feeding How should I use this medicine? This medicine is for injection under the skin. It is given by a health care professional in a hospital or clinic setting. If you are getting Prolia, a special MedGuide will be given to you by the pharmacist with each prescription and refill. Be sure to read this information carefully each time. For Prolia, talk to your pediatrician regarding the use of this medicine in children. Special care may be needed. For Xgeva, talk to your pediatrician regarding the use of this medicine in children. While this drug may be prescribed for children as young as 13 years for selected conditions, precautions do apply. Overdosage: If you think you have taken too much of this medicine contact a poison control center or emergency room at once. NOTE: This medicine is only for you. Do not share this medicine with others. What if I miss a dose? It is important not to miss your dose. Call your doctor or health care professional if you are unable to keep an appointment. What may interact with this medicine? Do not take this medicine with any of the following medications: -other medicines containing denosumab This medicine may also interact with the following medications: -medicines that lower your chance of fighting infection -steroid medicines like prednisone or cortisone This list may not describe all possible interactions. Give your health care provider a list of all the medicines, herbs, non-prescription drugs, or dietary supplements you use. Also tell them if you smoke, drink alcohol,   use illegal drugs. Some items may interact with your medicine. What should I watch for while using this medicine? Visit your doctor or health care professional for regular checks on your progress. Your doctor  or health care professional may order blood tests and other tests to see how you are doing. Call your doctor or health care professional for advice if you get a fever, chills or sore throat, or other symptoms of a cold or flu. Do not treat yourself. This drug may decrease your body's ability to fight infection. Try to avoid being around people who are sick. You should make sure you get enough calcium and vitamin D while you are taking this medicine, unless your doctor tells you not to. Discuss the foods you eat and the vitamins you take with your health care professional. See your dentist regularly. Brush and floss your teeth as directed. Before you have any dental work done, tell your dentist you are receiving this medicine. Do not become pregnant while taking this medicine or for 5 months after stopping it. Talk with your doctor or health care professional about your birth control options while taking this medicine. Women should inform their doctor if they wish to become pregnant or think they might be pregnant. There is a potential for serious side effects to an unborn child. Talk to your health care professional or pharmacist for more information. What side effects may I notice from receiving this medicine? Side effects that you should report to your doctor or health care professional as soon as possible: -allergic reactions like skin rash, itching or hives, swelling of the face, lips, or tongue -bone pain -breathing problems -dizziness -jaw pain, especially after dental work -redness, blistering, peeling of the skin -signs and symptoms of infection like fever or chills; cough; sore throat; pain or trouble passing urine -signs of low calcium like fast heartbeat, muscle cramps or muscle pain; pain, tingling, numbness in the hands or feet; seizures -unusual bleeding or bruising -unusually weak or tired Side effects that usually do not require medical attention (report to your doctor or health care  professional if they continue or are bothersome): -constipation -diarrhea -headache -joint pain -loss of appetite -muscle pain -runny nose -tiredness -upset stomach This list may not describe all possible side effects. Call your doctor for medical advice about side effects. You may report side effects to FDA at 1-800-FDA-1088. Where should I keep my medicine? This medicine is only given in a clinic, doctor's office, or other health care setting and will not be stored at home. NOTE: This sheet is a summary. It may not cover all possible information. If you have questions about this medicine, talk to your doctor, pharmacist, or health care provider.  2018 Elsevier/Gold Standard (2016-12-19 19:17:21)  

## 2018-02-12 ENCOUNTER — Telehealth: Payer: Self-pay

## 2018-02-12 ENCOUNTER — Other Ambulatory Visit: Payer: Self-pay | Admitting: Hematology

## 2018-02-12 NOTE — Telephone Encounter (Signed)
Spoke with patient concerning changes in his schedule, and to pick up a new copy of his calender on his next visit. Per 3/4 los

## 2018-02-13 ENCOUNTER — Ambulatory Visit: Payer: BLUE CROSS/BLUE SHIELD | Admitting: Physical Therapy

## 2018-02-14 ENCOUNTER — Encounter: Payer: Self-pay | Admitting: Hematology

## 2018-02-14 DIAGNOSIS — Z7189 Other specified counseling: Secondary | ICD-10-CM | POA: Insufficient documentation

## 2018-02-18 ENCOUNTER — Ambulatory Visit: Payer: BLUE CROSS/BLUE SHIELD

## 2018-02-20 ENCOUNTER — Telehealth: Payer: Self-pay

## 2018-02-20 NOTE — Telephone Encounter (Signed)
Received VM from pt that he had just received a call from the [facility]. Looked at notes, and per Charm Rings, [place], pt VM left with pt to let him know of schedule changes and to p/u new AVS. Returned call to pt to notify him of changes and to request new calendar on Friday in infusion. Pt verbalized understanding and plan to obtain new schedule.

## 2018-02-21 NOTE — Progress Notes (Signed)
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 His labs today 12/14/2017 show hgb 8.3, platelets 524k. He is doing well overall. He has staples in his right upper leg following a recent surgery and has not been in touch with the surgeons for a f/u of this. He is not receiving physical therapy and is not getting around at home.  He notes that he was not scheduled for a post hospitalization nephrology f/u and has no PCP.  On review of systems, pt reports intermittent fatigue, SOB, pain to the left leg, dizziness, dry mouth and denies fever, chills, night sweats, dysuria and any other accompanying symptoms.   INTERVAL HISTORY:   Andres Fernandez presents today for follow up and C5D4 of treatment of his myeloma/plasma cell leukemia. The patient's last visit with [name] was on 02/11/18. The pt reports that he is doing well overall.   The pt reports that he is tolerating all of his medication well. He notes that he is working 3 days a weeks and is tolerating that well. He notes that he has returned to his normal weight and eats well.   He notes that he will be seeing a Hep C specialist in April when he gets enough money for the copayment.   Lab results today (02/25/18) of CBC, CMP is as follows: all values are WNL except for RBC at 3.74, Hgb at 11.6, HCT at 34.0, Chloride at 113, CO2 at 21, BUN at 27, Creatinine at 2.10.  MMP 02/25/18 is pending.  Kappa/lambda 02/25/18 is pending. The 02/22/18 lab showed Kappa at 182.7 and Lambda at 12.9. And on 02/25/2018 kappa LC 132   No prohibitive treatment toxicity at this time.  On review of systems, pt reports numbness in digits 4 and 5 of his right hand, stable weight, and denies nausea, tingling and numbness in his feet  and left hand, and any other symptoms.   REVIEW OF SYSTEMS:    .10 Point review of Systems was done is negative except as noted above.   MEDICAL HISTORY:  Past Medical History:  Diagnosis Date  . BPH (benign prostatic hyperplasia)   . Hepatitis C 11/27/2017  . Hypertension   . Plasma cell leukemia ([place]) 11/23/2017  . Tobacco abuse     SURGICAL HISTORY: Past Surgical History:  Procedure Laterality Date  . APPENDECTOMY    . FEMUR IM NAIL Right 11/20/2017   Procedure: RIGHT FEMORAL INTRAMEDULLARY (IM) NAIL;  Surgeon: Nicholes Stairs, MD;  Location: [facility];  Service: Orthopedics;  Laterality: Right;    SOCIAL HISTORY: Social History   Socioeconomic History  . Marital status: Single    Spouse name: Not on file  . Number of children: Not on file  . Years of education: Not on file  . Highest education level: Not on file  Social Needs  . Financial resource strain: Not on file  . Food insecurity - worry: Not on file  . Food insecurity - inability: Not on file  . Transportation needs - medical: Not on file  . Transportation needs - non-medical: Not on file  Occupational History  . Not on file  Tobacco Use  . Smoking status: Former Smoker    Packs/day: 0.50    Types: Cigarettes  Last attempt to quit: 11/09/2017    Years since quitting: 0.2  . Smokeless tobacco: Never Used  . Tobacco comment: no cigarettes x 1 week.  Substance and Sexual Activity  . Alcohol use: Yes  . Drug use: No  . Sexual activity: Not on file  Other Topics Concern  . Not on file  Social History Narrative  . Not on file    FAMILY HISTORY: Family History  Problem Relation Age of Onset  . COPD Mother   . Liver cancer Mother     ALLERGIES:  has No Known Allergies.  MEDICATIONS:  . Current Outpatient Medications on File Prior to Visit  Medication Sig Dispense Refill  . acyclovir (ZOVIRAX) 400 MG tablet Take 1 tablet (400 mg total) by mouth daily. 30 tablet 6  . cholecalciferol  (VITAMIN D) 400 units TABS tablet Take 400 Units by mouth daily.    . cyclophosphamide (CYTOXAN) 50 MG capsule Take 11 caps (563m) by mouth once weekly on days 1, 8, &15, every 21 days. Take with breakfast, maintain hydration. 33 capsule 2  . NICOTINE STEP 2 14 MG/24HR patch PLACE 1 PATCH (14 MG TOTAL) ONTO THE SKIN DAILY. 28 patch 1  . ondansetron (ZOFRAN) 4 MG tablet Take 2 tablets (8 mg total) by mouth every 8 (eight) hours as needed for nausea. 30 tablet 0   No current facility-administered medications on file prior to visit.     PHYSICAL EXAMINATION:  VS reviewed Vitals:   02/25/18 1330  BP: 130/81  Pulse: 95  Resp: 20  Temp: 98.5 F (36.9 C)  SpO2: 99%     . GENERAL:alert, in no acute distress and comfortable SKIN: no acute rashes, no significant lesions EYES: conjunctiva are pink and non-injected, sclera anicteric OROPHARYNX: MMM, no exudates, no oropharyngeal erythema or ulceration NECK: supple, no JVD LYMPH:  no palpable lymphadenopathy in the cervical, axillary or inguinal regions LUNGS: clear to auscultation b/l with normal respiratory effort HEART: regular rate & rhythm ABDOMEN:  normoactive bowel sounds , non tender, not distended. Extremity: no pedal edema PSYCH: alert & oriented x 3 with fluent speech NEURO: no focal motor/sensory deficits      LABORATORY DATA:  I have reviewed the data as listed  . CBC Latest Ref Rng & Units 02/25/2018 02/22/2018 02/08/2018  WBC 4.0 - 10.3 K/uL 6.4 7.3 8.0  Hemoglobin 13.0 - 17.1 g/dL - 11.3(L) -  Hematocrit 38.4 - 49.9 % 34.0(L) 33.4(L) 36.4(L)  Platelets 140 - 400 K/uL 395 388 452(H)  hgb 11.6 . CBC    Component Value Date/Time   WBC 6.4 02/25/2018 1258   WBC 7.3 02/22/2018 1233   RBC 3.74 (L) 02/25/2018 1258   HGB 11.3 (L) 02/22/2018 1233   HGB 8.3 (L) 12/14/2017 1230   HCT 34.0 (L) 02/25/2018 1258   HCT 23.9 (L) 12/14/2017 1230   PLT 395 02/25/2018 1258   PLT 524 (H) 12/14/2017 1230   MCV 90.9 02/25/2018  1258   MCV 88.4 12/14/2017 1230   MCH 31.0 02/25/2018 1258   MCHC 34.1 02/25/2018 1258   RDW 14.4 02/25/2018 1258   RDW 15.3 (H) 12/14/2017 1230   LYMPHSABS 1.9 02/25/2018 1258   LYMPHSABS 1.4 12/14/2017 1230   MONOABS 0.9 02/25/2018 1258   MONOABS 0.6 12/14/2017 1230   EOSABS 0.1 02/25/2018 1258   EOSABS 0.1 12/14/2017 1230   BASOSABS 0.1 02/25/2018 1258   BASOSABS 0.1 12/14/2017 1230     . CMP Latest Ref Rng & Units 02/25/2018  02/22/2018 02/08/2018  Glucose 70 - 140 mg/dL 94 73 89  BUN 7 - 26 mg/dL 27(H) 24 30(H)  Creatinine 0.70 - 1.30 mg/dL 2.10(H) 2.09(H) 2.07(H)  Sodium 136 - 145 mmol/L 143 140 140  Potassium 3.5 - 5.1 mmol/L 4.5 5.1 5.4(H)  Chloride 98 - 109 mmol/L 113(H) 111(H) 109  CO2 22 - 29 mmol/L 21(L) 22 21(L)  Calcium 8.4 - 10.4 mg/dL 8.6 8.9 8.9  Total Protein 6.4 - 8.3 g/dL 6.8 6.8 7.0  Total Bilirubin 0.2 - 1.2 mg/dL 0.3 0.3 0.3  Alkaline Phos 40 - 150 U/L 97 101 110  AST 5 - 34 U/L 31 25 34  ALT 0 - 55 U/L 34 33 46              RADIOGRAPHIC STUDIES: I have personally reviewed the radiological images as listed and agreed with the findings in the report. No results found.  ASSESSMENT & PLAN:   62 y.o.A male presenting with severe hypercalcemia, skeletal findings consistent with myeloma and significant leukocytosis/lymphocytosis in the peripheral blood as well as AKI and hypercalcemia.  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 -Hypercalcemia -Renal Failure -Anemia -Extensive Bone lesions -Appears to be primarily Kappa Light chain Myeloma with no overt M spike. Appears to be responding to treatment.- with decreased kappa light chains.   BM Bx showed >95% involvement with kappa restricted serum free light chains. 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 MoL Cy-    PLAN  -continue CyBorD treatment as per orders. Today is C5D4. -counseled to maintain compliance with  f/u to Hep C clinic for evaluation and mx of recently noted Hep C -- this could potentially be a transplant consideration as well. -continue Acyclovir prophylaxis. -weekly labs on D1 AND D8 each cycle -Continue treatment as per orders -continue Xgeva q4weeks -Discussed pt labwork today 02/25/18; chemistries are stable, counts are stable.  -no proihibitive treatment toxicities. -Discussed that his light chains have been continually dropping; 02/22/18 showed Kappa down to 182.7 and Lambda at 12.9. And on 02/25/2018 kappa LC at down to 132. -Offered a referral to [facility] to evaluate the potential for an autologous BM transplant. The pt accepts this referral. - referral placed.  2) s/p Prophylactic IM nailing for rt femur. -Advised that we will re-evaluate the patient returning to work by March on the next visit following [facility].  -patient reports improvement and is planning to return to work part time.  3) Thrombocytopenia resolved  4)Renal insufficiency -- likely primarily related to Myeloma -Kidney numbers improved. 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  7) Renal insuff- due to myeloma.  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 Some drop in hgb due to rt gluteal hematoma Plan -Hgb at 11.6  -monitor esp with addition of Cytoxan  -continue CyBorD as per schedule -Plz schedule C6 of treatment -RTC with Dr Irene Limbo [phone] -labs D1 and D8 of each cycle of treatment -continue Marchelle Folks  - [facility].  . The total time  spent in the appointment was 25 minutes and more than 50%  was on counseling and direct patient cares.     Sullivan Lone MD [place] AAHIVMS [facility] Hematology/Oncology Physician [facility]  (Office):       [phone] (Work cell):  [phone] (Fax):           [phone]  This document serves as a record of services personally performed by Sullivan Lone, MD. It was created on his behalf by Baldwin Jamaica, a trained medical scribe. The creation of this record is based on the scribe's personal observations and the provider's statements to them.   .I have reviewed the above documentation for accuracy and completeness, and I agree with the above. Brunetta Genera MD MS

## 2018-02-22 ENCOUNTER — Other Ambulatory Visit: Payer: Self-pay

## 2018-02-22 ENCOUNTER — Inpatient Hospital Stay: Payer: BLUE CROSS/BLUE SHIELD

## 2018-02-22 VITALS — BP 150/90 | HR 82 | Temp 98.1°F | Resp 18

## 2018-02-22 DIAGNOSIS — C9010 Plasma cell leukemia not having achieved remission: Secondary | ICD-10-CM

## 2018-02-22 DIAGNOSIS — Z7189 Other specified counseling: Secondary | ICD-10-CM

## 2018-02-22 DIAGNOSIS — C9000 Multiple myeloma not having achieved remission: Secondary | ICD-10-CM

## 2018-02-22 LAB — CMP (CANCER CENTER ONLY)
ALBUMIN: 3.7 g/dL (ref 3.5–5.0)
ALK PHOS: 101 U/L (ref 40–150)
ALT: 33 U/L (ref 0–55)
ANION GAP: 7 (ref 3–11)
AST: 25 U/L (ref 5–34)
BUN: 24 mg/dL (ref 7–26)
CALCIUM: 8.9 mg/dL (ref 8.4–10.4)
CO2: 22 mmol/L (ref 22–29)
Chloride: 111 mmol/L — ABNORMAL HIGH (ref 98–109)
Creatinine: 2.09 mg/dL — ABNORMAL HIGH (ref 0.70–1.30)
GFR, Est AFR Am: 38 mL/min — ABNORMAL LOW (ref >60)
GFR, Estimated: 33 mL/min — ABNORMAL LOW (ref >60)
GLUCOSE: 73 mg/dL (ref 70–140)
Potassium: 5.1 mmol/L (ref 3.5–5.1)
SODIUM: 140 mmol/L (ref 136–145)
Total Bilirubin: 0.3 mg/dL (ref 0.2–1.2)
Total Protein: 6.8 g/dL (ref 6.4–8.3)

## 2018-02-22 LAB — RETICULOCYTES
RBC.: 3.72 MIL/uL — AB (ref 4.20–5.82)
Retic Count, Absolute: 33.5 10*3/uL — ABNORMAL LOW (ref 34.8–93.9)
Retic Ct Pct: 0.9 % (ref 0.8–1.8)

## 2018-02-22 LAB — CBC WITH DIFFERENTIAL/PLATELET
BASOS PCT: 1 %
Basophils Absolute: 0 10*3/uL (ref 0.0–0.1)
EOS PCT: 2 %
Eosinophils Absolute: 0.2 10*3/uL (ref 0.0–0.5)
HEMATOCRIT: 33.4 % — AB (ref 38.4–49.9)
Hemoglobin: 11.3 g/dL — ABNORMAL LOW (ref 13.0–17.1)
Lymphocytes Relative: 34 %
Lymphs Abs: 2.5 10*3/uL (ref 0.9–3.3)
MCH: 30.4 pg (ref 27.2–33.4)
MCHC: 33.8 g/dL (ref 32.0–36.0)
MCV: 89.8 fL (ref 79.3–98.0)
MONOS PCT: 8 %
Monocytes Absolute: 0.6 10*3/uL (ref 0.1–0.9)
NEUTROS ABS: 4 10*3/uL (ref 1.5–6.5)
Neutrophils Relative %: 55 %
Platelets: 388 10*3/uL (ref 140–400)
RBC: 3.72 MIL/uL — ABNORMAL LOW (ref 4.20–5.82)
RDW: 14.3 % (ref 11.0–14.6)
WBC: 7.3 10*3/uL (ref 4.0–10.3)

## 2018-02-22 MED ORDER — ONDANSETRON HCL 8 MG PO TABS
ORAL_TABLET | ORAL | Status: AC
  Filled 2018-02-22: qty 1

## 2018-02-22 MED ORDER — BORTEZOMIB CHEMO SQ INJECTION 3.5 MG (2.5MG/ML)
1.3000 mg/m2 | Freq: Once | INTRAMUSCULAR | Status: AC
  Administered 2018-02-22: 2.25 mg via SUBCUTANEOUS
  Filled 2018-02-22: qty 2.25

## 2018-02-22 MED ORDER — ONDANSETRON HCL 8 MG PO TABS
4.0000 mg | ORAL_TABLET | Freq: Once | ORAL | Status: AC
  Administered 2018-02-22: 4 mg via ORAL

## 2018-02-22 NOTE — Patient Instructions (Signed)
[facility] Discharge Instructions for Patients Receiving Chemotherapy  Today you received the following chemotherapy agent:  Velcade (bortezomib) To help prevent nausea and vomiting after your treatment, we encourage you to take your nausea medication as prescribed.   If you develop nausea and vomiting that is not controlled by your nausea medication, call the clinic.   BELOW ARE SYMPTOMS THAT SHOULD BE REPORTED IMMEDIATELY:  *FEVER GREATER THAN 100.5 F  *CHILLS WITH OR WITHOUT FEVER  NAUSEA AND VOMITING THAT IS NOT CONTROLLED WITH YOUR NAUSEA MEDICATION  *UNUSUAL SHORTNESS OF BREATH  *UNUSUAL BRUISING OR BLEEDING  TENDERNESS IN MOUTH AND THROAT WITH OR WITHOUT PRESENCE OF ULCERS  *URINARY PROBLEMS  *BOWEL PROBLEMS  UNUSUAL RASH Items with * indicate a potential emergency and should be followed up as soon as possible.  Feel free to call the clinic should you have any questions or concerns. The clinic phone number is [phone].  Please show the CHEMO ALERT CARD at check-in to the Emergency Department and triage nurse.  Denosumab injection (Xgeva) What is this medicine? DENOSUMAB (den oh sue mab) slows bone breakdown. Prolia is used to treat osteoporosis in women after menopause and in men. Xgeva is used to treat a high calcium level due to cancer and to prevent bone fractures and other bone problems caused by multiple myeloma or cancer bone metastases. Xgeva is also used to treat giant cell tumor of the bone. This medicine may be used for other purposes; ask your health care provider or pharmacist if you have questions. COMMON BRAND NAME(S): Prolia, XGEVA What should I tell my health care provider before I take this medicine? They need to know if you have any of these conditions: -dental disease -having surgery or tooth extraction -infection -kidney disease -low levels of calcium or Vitamin D in the blood -malnutrition -on hemodialysis -skin  conditions or sensitivity -thyroid or parathyroid disease -an unusual reaction to denosumab, other medicines, foods, dyes, or preservatives -pregnant or trying to get pregnant -breast-feeding How should I use this medicine? This medicine is for injection under the skin. It is given by a health care professional in a hospital or clinic setting. If you are getting Prolia, a special MedGuide will be given to you by the pharmacist with each prescription and refill. Be sure to read this information carefully each time. For Prolia, talk to your pediatrician regarding the use of this medicine in children. Special care may be needed. For Xgeva, talk to your pediatrician regarding the use of this medicine in children. While this drug may be prescribed for children as young as 13 years for selected conditions, precautions do apply. Overdosage: If you think you have taken too much of this medicine contact a poison control center or emergency room at once. NOTE: This medicine is only for you. Do not share this medicine with others. What if I miss a dose? It is important not to miss your dose. Call your doctor or health care professional if you are unable to keep an appointment. What may interact with this medicine? Do not take this medicine with any of the following medications: -other medicines containing denosumab This medicine may also interact with the following medications: -medicines that lower your chance of fighting infection -steroid medicines like prednisone or cortisone This list may not describe all possible interactions. Give your health care provider a list of all the medicines, herbs, non-prescription drugs, or dietary supplements you use. Also tell them if you smoke, drink alcohol, or   use illegal drugs. Some items may interact with your medicine. What should I watch for while using this medicine? Visit your doctor or health care professional for regular checks on your progress. Your doctor  or health care professional may order blood tests and other tests to see how you are doing. Call your doctor or health care professional for advice if you get a fever, chills or sore throat, or other symptoms of a cold or flu. Do not treat yourself. This drug may decrease your body's ability to fight infection. Try to avoid being around people who are sick. You should make sure you get enough calcium and vitamin D while you are taking this medicine, unless your doctor tells you not to. Discuss the foods you eat and the vitamins you take with your health care professional. See your dentist regularly. Brush and floss your teeth as directed. Before you have any dental work done, tell your dentist you are receiving this medicine. Do not become pregnant while taking this medicine or for 5 months after stopping it. Talk with your doctor or health care professional about your birth control options while taking this medicine. Women should inform their doctor if they wish to become pregnant or think they might be pregnant. There is a potential for serious side effects to an unborn child. Talk to your health care professional or pharmacist for more information. What side effects may I notice from receiving this medicine? Side effects that you should report to your doctor or health care professional as soon as possible: -allergic reactions like skin rash, itching or hives, swelling of the face, lips, or tongue -bone pain -breathing problems -dizziness -jaw pain, especially after dental work -redness, blistering, peeling of the skin -signs and symptoms of infection like fever or chills; cough; sore throat; pain or trouble passing urine -signs of low calcium like fast heartbeat, muscle cramps or muscle pain; pain, tingling, numbness in the hands or feet; seizures -unusual bleeding or bruising -unusually weak or tired Side effects that usually do not require medical attention (report to your doctor or health care  professional if they continue or are bothersome): -constipation -diarrhea -headache -joint pain -loss of appetite -muscle pain -runny nose -tiredness -upset stomach This list may not describe all possible side effects. Call your doctor for medical advice about side effects. You may report side effects to FDA at 1-800-FDA-1088. Where should I keep my medicine? This medicine is only given in a clinic, doctor's office, or other health care setting and will not be stored at home. NOTE: This sheet is a summary. It may not cover all possible information. If you have questions about this medicine, talk to your doctor, pharmacist, or health care provider.  2018 Elsevier/Gold Standard (2016-12-19 19:17:21)  

## 2018-02-22 NOTE — Progress Notes (Signed)
Ok to treat today with creatinine 2.09 per MD Irene Limbo

## 2018-02-25 ENCOUNTER — Inpatient Hospital Stay: Payer: BLUE CROSS/BLUE SHIELD

## 2018-02-25 ENCOUNTER — Other Ambulatory Visit: Payer: BLUE CROSS/BLUE SHIELD

## 2018-02-25 ENCOUNTER — Telehealth: Payer: Self-pay | Admitting: *Deleted

## 2018-02-25 ENCOUNTER — Encounter: Payer: BLUE CROSS/BLUE SHIELD | Admitting: Physical Therapy

## 2018-02-25 ENCOUNTER — Inpatient Hospital Stay (HOSPITAL_BASED_OUTPATIENT_CLINIC_OR_DEPARTMENT_OTHER): Payer: BLUE CROSS/BLUE SHIELD | Admitting: Hematology

## 2018-02-25 ENCOUNTER — Ambulatory Visit: Payer: BLUE CROSS/BLUE SHIELD

## 2018-02-25 ENCOUNTER — Encounter: Payer: Self-pay | Admitting: Hematology

## 2018-02-25 VITALS — BP 130/81 | HR 95 | Temp 98.5°F | Resp 20 | Ht 68.0 in | Wt 158.9 lb

## 2018-02-25 DIAGNOSIS — C9010 Plasma cell leukemia not having achieved remission: Secondary | ICD-10-CM

## 2018-02-25 DIAGNOSIS — C9000 Multiple myeloma not having achieved remission: Secondary | ICD-10-CM

## 2018-02-25 DIAGNOSIS — Z7189 Other specified counseling: Secondary | ICD-10-CM

## 2018-02-25 LAB — CMP (CANCER CENTER ONLY)
ALK PHOS: 97 U/L (ref 40–150)
ALT: 34 U/L (ref 0–55)
ANION GAP: 9 (ref 3–11)
AST: 31 U/L (ref 5–34)
Albumin: 3.6 g/dL (ref 3.5–5.0)
BILIRUBIN TOTAL: 0.3 mg/dL (ref 0.2–1.2)
BUN: 27 mg/dL — ABNORMAL HIGH (ref 7–26)
CALCIUM: 8.6 mg/dL (ref 8.4–10.4)
CO2: 21 mmol/L — ABNORMAL LOW (ref 22–29)
Chloride: 113 mmol/L — ABNORMAL HIGH (ref 98–109)
Creatinine: 2.1 mg/dL — ABNORMAL HIGH (ref 0.70–1.30)
GFR, EST AFRICAN AMERICAN: 38 mL/min — AB (ref >60)
GFR, Estimated: 33 mL/min — ABNORMAL LOW (ref >60)
GLUCOSE: 94 mg/dL (ref 70–140)
Potassium: 4.5 mmol/L (ref 3.5–5.1)
Sodium: 143 mmol/L (ref 136–145)
TOTAL PROTEIN: 6.8 g/dL (ref 6.4–8.3)

## 2018-02-25 LAB — CBC WITH DIFFERENTIAL (CANCER CENTER ONLY)
Basophils Absolute: 0.1 10*3/uL (ref 0.0–0.1)
Basophils Relative: 1 %
Eosinophils Absolute: 0.1 10*3/uL (ref 0.0–0.5)
Eosinophils Relative: 1 %
HEMATOCRIT: 34 % — AB (ref 38.4–49.9)
HEMOGLOBIN: 11.6 g/dL — AB (ref 13.0–17.1)
LYMPHS ABS: 1.9 10*3/uL (ref 0.9–3.3)
Lymphocytes Relative: 30 %
MCH: 31 pg (ref 27.2–33.4)
MCHC: 34.1 g/dL (ref 32.0–36.0)
MCV: 90.9 fL (ref 79.3–98.0)
MONOS PCT: 13 %
Monocytes Absolute: 0.9 10*3/uL (ref 0.1–0.9)
NEUTROS PCT: 55 %
Neutro Abs: 3.5 10*3/uL (ref 1.5–6.5)
Platelet Count: 395 10*3/uL (ref 140–400)
RBC: 3.74 MIL/uL — ABNORMAL LOW (ref 4.20–5.82)
RDW: 14.4 % (ref 11.0–14.6)
WBC Count: 6.4 10*3/uL (ref 4.0–10.3)

## 2018-02-25 LAB — KAPPA/LAMBDA LIGHT CHAINS
KAPPA FREE LGHT CHN: 182.7 mg/L — AB (ref 3.3–19.4)
Kappa, lambda light chain ratio: 14.16 — ABNORMAL HIGH (ref 0.26–1.65)
LAMDA FREE LIGHT CHAINS: 12.9 mg/L (ref 5.7–26.3)

## 2018-02-25 MED ORDER — ONDANSETRON HCL 8 MG PO TABS
ORAL_TABLET | ORAL | Status: AC
  Filled 2018-02-25: qty 1

## 2018-02-25 MED ORDER — ONDANSETRON HCL 8 MG PO TABS
4.0000 mg | ORAL_TABLET | Freq: Once | ORAL | Status: AC
  Administered 2018-02-25: 4 mg via ORAL

## 2018-02-25 MED ORDER — BORTEZOMIB CHEMO SQ INJECTION 3.5 MG (2.5MG/ML)
1.3000 mg/m2 | Freq: Once | INTRAMUSCULAR | Status: AC
  Administered 2018-02-25: 2.25 mg via SUBCUTANEOUS
  Filled 2018-02-25: qty 2.25

## 2018-02-25 MED ORDER — DEXAMETHASONE 4 MG PO TABS: 20.0000 mg | ORAL_TABLET | ORAL | 1 refills | Status: DC

## 2018-02-25 NOTE — Patient Instructions (Signed)
[facility] Discharge Instructions for Patients Receiving Chemotherapy  Today you received the following chemotherapy agent:  Velcade (bortezomib) To help prevent nausea and vomiting after your treatment, we encourage you to take your nausea medication as prescribed.   If you develop nausea and vomiting that is not controlled by your nausea medication, call the clinic.   BELOW ARE SYMPTOMS THAT SHOULD BE REPORTED IMMEDIATELY:  *FEVER GREATER THAN 100.5 F  *CHILLS WITH OR WITHOUT FEVER  NAUSEA AND VOMITING THAT IS NOT CONTROLLED WITH YOUR NAUSEA MEDICATION  *UNUSUAL SHORTNESS OF BREATH  *UNUSUAL BRUISING OR BLEEDING  TENDERNESS IN MOUTH AND THROAT WITH OR WITHOUT PRESENCE OF ULCERS  *URINARY PROBLEMS  *BOWEL PROBLEMS  UNUSUAL RASH Items with * indicate a potential emergency and should be followed up as soon as possible.  Feel free to call the clinic should you have any questions or concerns. The clinic phone number is [phone].  Please show the CHEMO ALERT CARD at check-in to the Emergency Department and triage nurse.  Denosumab injection (Xgeva) What is this medicine? DENOSUMAB (den oh sue mab) slows bone breakdown. Prolia is used to treat osteoporosis in women after menopause and in men. Xgeva is used to treat a high calcium level due to cancer and to prevent bone fractures and other bone problems caused by multiple myeloma or cancer bone metastases. Xgeva is also used to treat giant cell tumor of the bone. This medicine may be used for other purposes; ask your health care provider or pharmacist if you have questions. COMMON BRAND NAME(S): Prolia, XGEVA What should I tell my health care provider before I take this medicine? They need to know if you have any of these conditions: -dental disease -having surgery or tooth extraction -infection -kidney disease -low levels of calcium or Vitamin D in the blood -malnutrition -on hemodialysis -skin  conditions or sensitivity -thyroid or parathyroid disease -an unusual reaction to denosumab, other medicines, foods, dyes, or preservatives -pregnant or trying to get pregnant -breast-feeding How should I use this medicine? This medicine is for injection under the skin. It is given by a health care professional in a hospital or clinic setting. If you are getting Prolia, a special MedGuide will be given to you by the pharmacist with each prescription and refill. Be sure to read this information carefully each time. For Prolia, talk to your pediatrician regarding the use of this medicine in children. Special care may be needed. For Xgeva, talk to your pediatrician regarding the use of this medicine in children. While this drug may be prescribed for children as young as 13 years for selected conditions, precautions do apply. Overdosage: If you think you have taken too much of this medicine contact a poison control center or emergency room at once. NOTE: This medicine is only for you. Do not share this medicine with others. What if I miss a dose? It is important not to miss your dose. Call your doctor or health care professional if you are unable to keep an appointment. What may interact with this medicine? Do not take this medicine with any of the following medications: -other medicines containing denosumab This medicine may also interact with the following medications: -medicines that lower your chance of fighting infection -steroid medicines like prednisone or cortisone This list may not describe all possible interactions. Give your health care provider a list of all the medicines, herbs, non-prescription drugs, or dietary supplements you use. Also tell them if you smoke, drink alcohol, or   use illegal drugs. Some items may interact with your medicine. What should I watch for while using this medicine? Visit your doctor or health care professional for regular checks on your progress. Your doctor  or health care professional may order blood tests and other tests to see how you are doing. Call your doctor or health care professional for advice if you get a fever, chills or sore throat, or other symptoms of a cold or flu. Do not treat yourself. This drug may decrease your body's ability to fight infection. Try to avoid being around people who are sick. You should make sure you get enough calcium and vitamin D while you are taking this medicine, unless your doctor tells you not to. Discuss the foods you eat and the vitamins you take with your health care professional. See your dentist regularly. Brush and floss your teeth as directed. Before you have any dental work done, tell your dentist you are receiving this medicine. Do not become pregnant while taking this medicine or for 5 months after stopping it. Talk with your doctor or health care professional about your birth control options while taking this medicine. Women should inform their doctor if they wish to become pregnant or think they might be pregnant. There is a potential for serious side effects to an unborn child. Talk to your health care professional or pharmacist for more information. What side effects may I notice from receiving this medicine? Side effects that you should report to your doctor or health care professional as soon as possible: -allergic reactions like skin rash, itching or hives, swelling of the face, lips, or tongue -bone pain -breathing problems -dizziness -jaw pain, especially after dental work -redness, blistering, peeling of the skin -signs and symptoms of infection like fever or chills; cough; sore throat; pain or trouble passing urine -signs of low calcium like fast heartbeat, muscle cramps or muscle pain; pain, tingling, numbness in the hands or feet; seizures -unusual bleeding or bruising -unusually weak or tired Side effects that usually do not require medical attention (report to your doctor or health care  professional if they continue or are bothersome): -constipation -diarrhea -headache -joint pain -loss of appetite -muscle pain -runny nose -tiredness -upset stomach This list may not describe all possible side effects. Call your doctor for medical advice about side effects. You may report side effects to FDA at 1-800-FDA-1088. Where should I keep my medicine? This medicine is only given in a clinic, doctor's office, or other health care setting and will not be stored at home. NOTE: This sheet is a summary. It may not cover all possible information. If you have questions about this medicine, talk to your doctor, pharmacist, or health care provider.  2018 Elsevier/Gold Standard (2016-12-19 19:17:21)  

## 2018-02-25 NOTE — Progress Notes (Signed)
OK to treat with SCr 2.1 per Dr. Irene Limbo.

## 2018-02-25 NOTE — Telephone Encounter (Signed)
[facility] contacted to set up referral for transplant consideration with Dr. Norma Fredrickson.  Office note/pathology/labs faxed to [phone]. Fax confirmation received.  New patient coordinator to set up appointment once information received/reviewed.

## 2018-02-25 NOTE — Patient Instructions (Signed)
Thank you for choosing [facility] to provide your oncology and hematology care.  To afford each patient quality time with our providers, please arrive 30 minutes before your scheduled appointment time.  If you arrive late for your appointment, you may be asked to reschedule.  We strive to give you quality time with our providers, and arriving late affects you and other patients whose appointments are after yours.   If you are a no show for multiple scheduled visits, you may be dismissed from the clinic at the providers discretion.    Again, thank you for choosing [facility], our hope is that these requests will decrease the amount of time that you wait before being seen by our physicians.  ______________________________________________________________________  Should you have questions after your visit to the [facility], please contact our office at [phone] between the hours of 8:30 and 4:30 p.m.    Voicemails left after 4:30p.m will not be returned until the following business day.    For prescription refill requests, please have your pharmacy contact us directly.  Please also try to allow 48 hours for prescription requests.    Please contact the scheduling department for questions regarding scheduling.  For scheduling of procedures such as PET scans, CT scans, MRI, Ultrasound, etc please contact central scheduling at [phone].    Resources For Cancer Patients and Caregivers:   Oncolink.org:  A wonderful resource for patients and healthcare providers for information regarding your disease, ways to tract your treatment, what to expect, etc.     American Cancer Society:  800-227-2345  Can help patients locate various types of support and financial assistance  Cancer Care: 1-800-813-HOPE (4673) Provides financial assistance, online support groups, medication/co-pay assistance.    [place] DSS:  336-641-3447 Where to apply for food  stamps, Medicaid, and utility assistance  Medicare Rights Center: 800-333-4114 Helps people with Medicare understand their rights and benefits, navigate the Medicare system, and secure the quality healthcare they deserve  SCAT: 336-333-6589 [place] [employer]'s shared-ride transportation service for eligible riders who have a disability that prevents them from riding the fixed route bus.    For additional information on assistance programs please contact our social worker:   Grier Hock/Abigail Elmore:  [phone]            

## 2018-02-26 ENCOUNTER — Telehealth: Payer: Self-pay

## 2018-02-26 LAB — MULTIPLE MYELOMA PANEL, SERUM
ALBUMIN SERPL ELPH-MCNC: 3.7 g/dL (ref 2.9–4.4)
ALPHA 1: 0.2 g/dL (ref 0.0–0.4)
ALPHA2 GLOB SERPL ELPH-MCNC: 1.1 g/dL — AB (ref 0.4–1.0)
Albumin/Glob SerPl: 1.4 (ref 0.7–1.7)
B-GLOBULIN SERPL ELPH-MCNC: 0.9 g/dL (ref 0.7–1.3)
Gamma Glob SerPl Elph-Mcnc: 0.7 g/dL (ref 0.4–1.8)
Globulin, Total: 2.8 g/dL (ref 2.2–3.9)
IGG (IMMUNOGLOBIN G), SERUM: 751 mg/dL (ref 700–1600)
IgA: 19 mg/dL — ABNORMAL LOW (ref 90–386)
IgM (Immunoglobulin M), Srm: 19 mg/dL — ABNORMAL LOW (ref 20–172)
TOTAL PROTEIN ELP: 6.5 g/dL (ref 6.0–8.5)

## 2018-02-26 LAB — KAPPA/LAMBDA LIGHT CHAINS
Kappa free light chain: 132.1 mg/L — ABNORMAL HIGH (ref 3.3–19.4)
Kappa, lambda light chain ratio: 10.4 — ABNORMAL HIGH (ref 0.26–1.65)
LAMDA FREE LIGHT CHAINS: 12.7 mg/L (ref 5.7–26.3)

## 2018-02-26 NOTE — Telephone Encounter (Signed)
Left a detailed voice mail concerning upcoming appointment. Per 3/18 los (RN okayed 11:20 )time slot.

## 2018-02-27 ENCOUNTER — Encounter: Payer: BLUE CROSS/BLUE SHIELD | Admitting: Physical Therapy

## 2018-02-27 LAB — MULTIPLE MYELOMA PANEL, SERUM
ALBUMIN/GLOB SERPL: 1.3 (ref 0.7–1.7)
Albumin SerPl Elph-Mcnc: 3.5 g/dL (ref 2.9–4.4)
Alpha 1: 0.2 g/dL (ref 0.0–0.4)
Alpha2 Glob SerPl Elph-Mcnc: 1 g/dL (ref 0.4–1.0)
B-Globulin SerPl Elph-Mcnc: 0.8 g/dL (ref 0.7–1.3)
GAMMA GLOB SERPL ELPH-MCNC: 0.7 g/dL (ref 0.4–1.8)
GLOBULIN, TOTAL: 2.7 g/dL (ref 2.2–3.9)
IgA: 19 mg/dL — ABNORMAL LOW (ref 90–386)
IgG (Immunoglobin G), Serum: 769 mg/dL (ref 700–1600)
IgM (Immunoglobulin M), Srm: 19 mg/dL — ABNORMAL LOW (ref 20–172)
Total Protein ELP: 6.2 g/dL (ref 6.0–8.5)

## 2018-02-28 ENCOUNTER — Other Ambulatory Visit: Payer: Self-pay | Admitting: *Deleted

## 2018-02-28 ENCOUNTER — Encounter: Payer: Self-pay | Admitting: *Deleted

## 2018-02-28 DIAGNOSIS — C9010 Plasma cell leukemia not having achieved remission: Secondary | ICD-10-CM

## 2018-03-01 ENCOUNTER — Inpatient Hospital Stay: Payer: BLUE CROSS/BLUE SHIELD

## 2018-03-01 VITALS — BP 137/81 | HR 79 | Temp 98.2°F | Resp 17

## 2018-03-01 DIAGNOSIS — C9010 Plasma cell leukemia not having achieved remission: Secondary | ICD-10-CM

## 2018-03-01 DIAGNOSIS — Z7189 Other specified counseling: Secondary | ICD-10-CM

## 2018-03-01 LAB — CMP (CANCER CENTER ONLY)
ALBUMIN: 3.6 g/dL (ref 3.5–5.0)
ALT: 32 U/L (ref 0–55)
AST: 27 U/L (ref 5–34)
Alkaline Phosphatase: 100 U/L (ref 40–150)
Anion gap: 7 (ref 3–11)
BUN: 27 mg/dL — ABNORMAL HIGH (ref 7–26)
CALCIUM: 9.1 mg/dL (ref 8.4–10.4)
CHLORIDE: 112 mmol/L — AB (ref 98–109)
CO2: 23 mmol/L (ref 22–29)
Creatinine: 2.12 mg/dL — ABNORMAL HIGH (ref 0.70–1.30)
GFR, EST AFRICAN AMERICAN: 37 mL/min — AB (ref >60)
GFR, Estimated: 32 mL/min — ABNORMAL LOW (ref >60)
GLUCOSE: 81 mg/dL (ref 70–140)
Potassium: 5.2 mmol/L — ABNORMAL HIGH (ref 3.5–5.1)
SODIUM: 142 mmol/L (ref 136–145)
Total Bilirubin: 0.2 mg/dL (ref 0.2–1.2)
Total Protein: 6.7 g/dL (ref 6.4–8.3)

## 2018-03-01 LAB — CBC WITH DIFFERENTIAL (CANCER CENTER ONLY)
BASOS ABS: 0 10*3/uL (ref 0.0–0.1)
BASOS PCT: 0 %
EOS ABS: 0.1 10*3/uL (ref 0.0–0.5)
Eosinophils Relative: 2 %
HCT: 33.9 % — ABNORMAL LOW (ref 38.4–49.9)
Hemoglobin: 11.2 g/dL — ABNORMAL LOW (ref 13.0–17.1)
LYMPHS ABS: 1.8 10*3/uL (ref 0.9–3.3)
Lymphocytes Relative: 31 %
MCH: 30.2 pg (ref 27.2–33.4)
MCHC: 33 g/dL (ref 32.0–36.0)
MCV: 91.4 fL (ref 79.3–98.0)
Monocytes Absolute: 1.1 10*3/uL — ABNORMAL HIGH (ref 0.1–0.9)
Monocytes Relative: 19 %
NEUTROS PCT: 48 %
Neutro Abs: 2.7 10*3/uL (ref 1.5–6.5)
PLATELETS: 349 10*3/uL (ref 140–400)
RBC: 3.71 MIL/uL — AB (ref 4.20–5.82)
RDW: 14.1 % (ref 11.0–14.6)
WBC: 5.6 10*3/uL (ref 4.0–10.3)

## 2018-03-01 MED ORDER — ONDANSETRON HCL 8 MG PO TABS
4.0000 mg | ORAL_TABLET | Freq: Once | ORAL | Status: AC
  Administered 2018-03-01: 4 mg via ORAL

## 2018-03-01 MED ORDER — BORTEZOMIB CHEMO SQ INJECTION 3.5 MG (2.5MG/ML)
1.3000 mg/m2 | Freq: Once | INTRAMUSCULAR | Status: AC
  Administered 2018-03-01: 2.25 mg via SUBCUTANEOUS
  Filled 2018-03-01: qty 2.25

## 2018-03-01 MED ORDER — ONDANSETRON HCL 8 MG PO TABS
ORAL_TABLET | ORAL | Status: AC
  Filled 2018-03-01: qty 1

## 2018-03-01 NOTE — Progress Notes (Signed)
Ok to treat with labs today per MD Irene Limbo

## 2018-03-01 NOTE — Patient Instructions (Signed)
[facility] Discharge Instructions for Patients Receiving Chemotherapy  Today you received the following chemotherapy agent:  Velcade (bortezomib) To help prevent nausea and vomiting after your treatment, we encourage you to take your nausea medication as prescribed.   If you develop nausea and vomiting that is not controlled by your nausea medication, call the clinic.   BELOW ARE SYMPTOMS THAT SHOULD BE REPORTED IMMEDIATELY:  *FEVER GREATER THAN 100.5 F  *CHILLS WITH OR WITHOUT FEVER  NAUSEA AND VOMITING THAT IS NOT CONTROLLED WITH YOUR NAUSEA MEDICATION  *UNUSUAL SHORTNESS OF BREATH  *UNUSUAL BRUISING OR BLEEDING  TENDERNESS IN MOUTH AND THROAT WITH OR WITHOUT PRESENCE OF ULCERS  *URINARY PROBLEMS  *BOWEL PROBLEMS  UNUSUAL RASH Items with * indicate a potential emergency and should be followed up as soon as possible.  Feel free to call the clinic should you have any questions or concerns. The clinic phone number is [phone].  Please show the CHEMO ALERT CARD at check-in to the Emergency Department and triage nurse.  Denosumab injection (Xgeva) What is this medicine? DENOSUMAB (den oh sue mab) slows bone breakdown. Prolia is used to treat osteoporosis in women after menopause and in men. Xgeva is used to treat a high calcium level due to cancer and to prevent bone fractures and other bone problems caused by multiple myeloma or cancer bone metastases. Xgeva is also used to treat giant cell tumor of the bone. This medicine may be used for other purposes; ask your health care provider or pharmacist if you have questions. COMMON BRAND NAME(S): Prolia, XGEVA What should I tell my health care provider before I take this medicine? They need to know if you have any of these conditions: -dental disease -having surgery or tooth extraction -infection -kidney disease -low levels of calcium or Vitamin D in the blood -malnutrition -on hemodialysis -skin  conditions or sensitivity -thyroid or parathyroid disease -an unusual reaction to denosumab, other medicines, foods, dyes, or preservatives -pregnant or trying to get pregnant -breast-feeding How should I use this medicine? This medicine is for injection under the skin. It is given by a health care professional in a hospital or clinic setting. If you are getting Prolia, a special MedGuide will be given to you by the pharmacist with each prescription and refill. Be sure to read this information carefully each time. For Prolia, talk to your pediatrician regarding the use of this medicine in children. Special care may be needed. For Xgeva, talk to your pediatrician regarding the use of this medicine in children. While this drug may be prescribed for children as young as 13 years for selected conditions, precautions do apply. Overdosage: If you think you have taken too much of this medicine contact a poison control center or emergency room at once. NOTE: This medicine is only for you. Do not share this medicine with others. What if I miss a dose? It is important not to miss your dose. Call your doctor or health care professional if you are unable to keep an appointment. What may interact with this medicine? Do not take this medicine with any of the following medications: -other medicines containing denosumab This medicine may also interact with the following medications: -medicines that lower your chance of fighting infection -steroid medicines like prednisone or cortisone This list may not describe all possible interactions. Give your health care provider a list of all the medicines, herbs, non-prescription drugs, or dietary supplements you use. Also tell them if you smoke, drink alcohol, or   use illegal drugs. Some items may interact with your medicine. What should I watch for while using this medicine? Visit your doctor or health care professional for regular checks on your progress. Your doctor  or health care professional may order blood tests and other tests to see how you are doing. Call your doctor or health care professional for advice if you get a fever, chills or sore throat, or other symptoms of a cold or flu. Do not treat yourself. This drug may decrease your body's ability to fight infection. Try to avoid being around people who are sick. You should make sure you get enough calcium and vitamin D while you are taking this medicine, unless your doctor tells you not to. Discuss the foods you eat and the vitamins you take with your health care professional. See your dentist regularly. Brush and floss your teeth as directed. Before you have any dental work done, tell your dentist you are receiving this medicine. Do not become pregnant while taking this medicine or for 5 months after stopping it. Talk with your doctor or health care professional about your birth control options while taking this medicine. Women should inform their doctor if they wish to become pregnant or think they might be pregnant. There is a potential for serious side effects to an unborn child. Talk to your health care professional or pharmacist for more information. What side effects may I notice from receiving this medicine? Side effects that you should report to your doctor or health care professional as soon as possible: -allergic reactions like skin rash, itching or hives, swelling of the face, lips, or tongue -bone pain -breathing problems -dizziness -jaw pain, especially after dental work -redness, blistering, peeling of the skin -signs and symptoms of infection like fever or chills; cough; sore throat; pain or trouble passing urine -signs of low calcium like fast heartbeat, muscle cramps or muscle pain; pain, tingling, numbness in the hands or feet; seizures -unusual bleeding or bruising -unusually weak or tired Side effects that usually do not require medical attention (report to your doctor or health care  professional if they continue or are bothersome): -constipation -diarrhea -headache -joint pain -loss of appetite -muscle pain -runny nose -tiredness -upset stomach This list may not describe all possible side effects. Call your doctor for medical advice about side effects. You may report side effects to FDA at 1-800-FDA-1088. Where should I keep my medicine? This medicine is only given in a clinic, doctor's office, or other health care setting and will not be stored at home. NOTE: This sheet is a summary. It may not cover all possible information. If you have questions about this medicine, talk to your doctor, pharmacist, or health care provider.  2018 Elsevier/Gold Standard (2016-12-19 19:17:21)  

## 2018-03-04 ENCOUNTER — Inpatient Hospital Stay: Payer: BLUE CROSS/BLUE SHIELD

## 2018-03-04 ENCOUNTER — Encounter: Payer: Self-pay | Admitting: *Deleted

## 2018-03-04 ENCOUNTER — Other Ambulatory Visit: Payer: BLUE CROSS/BLUE SHIELD

## 2018-03-04 VITALS — BP 131/79 | HR 93 | Temp 98.1°F | Resp 18

## 2018-03-04 DIAGNOSIS — C9010 Plasma cell leukemia not having achieved remission: Secondary | ICD-10-CM

## 2018-03-04 DIAGNOSIS — Z7189 Other specified counseling: Secondary | ICD-10-CM

## 2018-03-04 MED ORDER — BORTEZOMIB CHEMO SQ INJECTION 3.5 MG (2.5MG/ML)
1.3000 mg/m2 | Freq: Once | INTRAMUSCULAR | Status: AC
Start: 2018-03-04 — End: 2018-03-04
  Administered 2018-03-04: 2.25 mg via SUBCUTANEOUS
  Filled 2018-03-04: qty 2.25

## 2018-03-04 MED ORDER — ONDANSETRON HCL 8 MG PO TABS
4.0000 mg | ORAL_TABLET | Freq: Once | ORAL | Status: AC
  Administered 2018-03-04: 4 mg via ORAL

## 2018-03-04 MED ORDER — ONDANSETRON HCL 8 MG PO TABS
ORAL_TABLET | ORAL | Status: AC
  Filled 2018-03-04: qty 1

## 2018-03-04 NOTE — Patient Instructions (Addendum)
[facility] Discharge Instructions for Patients Receiving Chemotherapy  Today you received the following chemotherapy agent:  Velcade (bortezomib) To help prevent nausea and vomiting after your treatment, we encourage you to take your nausea medication as prescribed.   If you develop nausea and vomiting that is not controlled by your nausea medication, call the clinic.   BELOW ARE SYMPTOMS THAT SHOULD BE REPORTED IMMEDIATELY:  *FEVER GREATER THAN 100.5 F  *CHILLS WITH OR WITHOUT FEVER  NAUSEA AND VOMITING THAT IS NOT CONTROLLED WITH YOUR NAUSEA MEDICATION  *UNUSUAL SHORTNESS OF BREATH  *UNUSUAL BRUISING OR BLEEDING  TENDERNESS IN MOUTH AND THROAT WITH OR WITHOUT PRESENCE OF ULCERS  *URINARY PROBLEMS  *BOWEL PROBLEMS  UNUSUAL RASH Items with * indicate a potential emergency and should be followed up as soon as possible.  Feel free to call the clinic should you have any questions or concerns. The clinic phone number is [phone].  Please show the [place] at check-in to the Emergency Department and triage nurse.

## 2018-03-08 ENCOUNTER — Ambulatory Visit: Payer: BLUE CROSS/BLUE SHIELD

## 2018-03-15 ENCOUNTER — Other Ambulatory Visit: Payer: Self-pay

## 2018-03-15 ENCOUNTER — Inpatient Hospital Stay: Payer: BLUE CROSS/BLUE SHIELD | Attending: Hematology

## 2018-03-15 ENCOUNTER — Inpatient Hospital Stay: Payer: BLUE CROSS/BLUE SHIELD

## 2018-03-15 ENCOUNTER — Ambulatory Visit: Payer: BLUE CROSS/BLUE SHIELD | Admitting: Hematology

## 2018-03-15 ENCOUNTER — Other Ambulatory Visit: Payer: BLUE CROSS/BLUE SHIELD

## 2018-03-15 VITALS — BP 135/89

## 2018-03-15 DIAGNOSIS — C9000 Multiple myeloma not having achieved remission: Secondary | ICD-10-CM

## 2018-03-15 DIAGNOSIS — D638 Anemia in other chronic diseases classified elsewhere: Secondary | ICD-10-CM | POA: Diagnosis not present

## 2018-03-15 DIAGNOSIS — R42 Dizziness and giddiness: Secondary | ICD-10-CM | POA: Diagnosis not present

## 2018-03-15 DIAGNOSIS — R5383 Other fatigue: Secondary | ICD-10-CM | POA: Diagnosis not present

## 2018-03-15 DIAGNOSIS — R0602 Shortness of breath: Secondary | ICD-10-CM | POA: Diagnosis not present

## 2018-03-15 DIAGNOSIS — R200 Anesthesia of skin: Secondary | ICD-10-CM | POA: Diagnosis not present

## 2018-03-15 DIAGNOSIS — F17210 Nicotine dependence, cigarettes, uncomplicated: Secondary | ICD-10-CM | POA: Insufficient documentation

## 2018-03-15 DIAGNOSIS — I10 Essential (primary) hypertension: Secondary | ICD-10-CM | POA: Insufficient documentation

## 2018-03-15 DIAGNOSIS — C9010 Plasma cell leukemia not having achieved remission: Secondary | ICD-10-CM | POA: Diagnosis present

## 2018-03-15 DIAGNOSIS — Z79899 Other long term (current) drug therapy: Secondary | ICD-10-CM | POA: Diagnosis not present

## 2018-03-15 DIAGNOSIS — N400 Enlarged prostate without lower urinary tract symptoms: Secondary | ICD-10-CM | POA: Insufficient documentation

## 2018-03-15 DIAGNOSIS — N289 Disorder of kidney and ureter, unspecified: Secondary | ICD-10-CM | POA: Diagnosis not present

## 2018-03-15 DIAGNOSIS — B1920 Unspecified viral hepatitis C without hepatic coma: Secondary | ICD-10-CM | POA: Diagnosis not present

## 2018-03-15 DIAGNOSIS — M84551A Pathological fracture in neoplastic disease, right femur, initial encounter for fracture: Secondary | ICD-10-CM

## 2018-03-15 DIAGNOSIS — Z5111 Encounter for antineoplastic chemotherapy: Secondary | ICD-10-CM | POA: Insufficient documentation

## 2018-03-15 DIAGNOSIS — R682 Dry mouth, unspecified: Secondary | ICD-10-CM | POA: Diagnosis not present

## 2018-03-15 DIAGNOSIS — M899 Disorder of bone, unspecified: Secondary | ICD-10-CM | POA: Insufficient documentation

## 2018-03-15 DIAGNOSIS — Z7189 Other specified counseling: Secondary | ICD-10-CM

## 2018-03-15 LAB — CBC WITH DIFFERENTIAL (CANCER CENTER ONLY)
Basophils Absolute: 0.1 10*3/uL (ref 0.0–0.1)
Basophils Relative: 1 %
Eosinophils Absolute: 0.1 10*3/uL (ref 0.0–0.5)
Eosinophils Relative: 2 %
HEMATOCRIT: 34.7 % — AB (ref 38.4–49.9)
HEMOGLOBIN: 11.8 g/dL — AB (ref 13.0–17.1)
LYMPHS ABS: 1.4 10*3/uL (ref 0.9–3.3)
Lymphocytes Relative: 25 %
MCH: 30.9 pg (ref 27.2–33.4)
MCHC: 33.9 g/dL (ref 32.0–36.0)
MCV: 91.2 fL (ref 79.3–98.0)
MONOS PCT: 13 %
Monocytes Absolute: 0.7 10*3/uL (ref 0.1–0.9)
NEUTROS ABS: 3.3 10*3/uL (ref 1.5–6.5)
NEUTROS PCT: 59 %
Platelet Count: 395 10*3/uL (ref 140–400)
RBC: 3.8 MIL/uL — ABNORMAL LOW (ref 4.20–5.82)
RDW: 14.5 % (ref 11.0–14.6)
WBC Count: 5.5 10*3/uL (ref 4.0–10.3)

## 2018-03-15 LAB — CMP (CANCER CENTER ONLY)
ALT: 34 U/L (ref 0–55)
ANION GAP: 8 (ref 3–11)
AST: 25 U/L (ref 5–34)
Albumin: 3.7 g/dL (ref 3.5–5.0)
Alkaline Phosphatase: 92 U/L (ref 40–150)
BUN: 28 mg/dL — ABNORMAL HIGH (ref 7–26)
CHLORIDE: 114 mmol/L — AB (ref 98–109)
CO2: 20 mmol/L — AB (ref 22–29)
Calcium: 8.4 mg/dL (ref 8.4–10.4)
Creatinine: 2.02 mg/dL — ABNORMAL HIGH (ref 0.70–1.30)
GFR, EST AFRICAN AMERICAN: 40 mL/min — AB (ref >60)
GFR, Estimated: 34 mL/min — ABNORMAL LOW (ref >60)
Glucose, Bld: 99 mg/dL (ref 70–140)
Potassium: 4.6 mmol/L (ref 3.5–5.1)
SODIUM: 142 mmol/L (ref 136–145)
Total Bilirubin: 0.3 mg/dL (ref 0.2–1.2)
Total Protein: 6.7 g/dL (ref 6.4–8.3)

## 2018-03-15 MED ORDER — DENOSUMAB 120 MG/1.7ML ~~LOC~~ SOLN
SUBCUTANEOUS | Status: AC
  Filled 2018-03-15: qty 1.7

## 2018-03-15 MED ORDER — BORTEZOMIB CHEMO SQ INJECTION 3.5 MG (2.5MG/ML)
1.3000 mg/m2 | Freq: Once | INTRAMUSCULAR | Status: AC
  Administered 2018-03-15: 2.25 mg via SUBCUTANEOUS
  Filled 2018-03-15: qty 2.25

## 2018-03-15 MED ORDER — ONDANSETRON HCL 8 MG PO TABS
4.0000 mg | ORAL_TABLET | Freq: Once | ORAL | Status: AC
  Administered 2018-03-15: 4 mg via ORAL

## 2018-03-15 MED ORDER — DENOSUMAB 120 MG/1.7ML ~~LOC~~ SOLN
120.0000 mg | Freq: Once | SUBCUTANEOUS | Status: AC
  Administered 2018-03-15: 120 mg via SUBCUTANEOUS

## 2018-03-15 MED ORDER — ONDANSETRON HCL 8 MG PO TABS
ORAL_TABLET | ORAL | Status: AC
  Filled 2018-03-15: qty 1

## 2018-03-15 NOTE — Progress Notes (Signed)
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 His labs today 12/14/2017 show hgb 8.3, platelets 524k. He is doing well overall. He has staples in his right upper leg following a recent surgery and has not been in touch with the surgeons for a f/u of this. He is not receiving physical therapy and is not getting around at home.  He notes that he was not scheduled for a post hospitalization nephrology f/u and has no PCP.  On review of systems, pt reports intermittent fatigue, SOB, pain to the left leg, dizziness, dry mouth and denies fever, chills, night sweats, dysuria and any other accompanying symptoms.   INTERVAL HISTORY:   Andres Fernandez presents today for follow up and C6D4 of treatment of his myeloma/plasma cell leukemia. The patient's last visit with [name] was on 02/26/18. The pt reports that he is doing well overall.   The pt reports that he has no new concerns. The pt notes that he will be making an appointment with the Hepatitis clinic today, as his finances are now caught up. He plans to take his [facility]res Fernandez referral to Andres Fernandez some time after his Hepatitis Clinic visit.   He notes that he will be completely quitting smoking on 03/30/18 and is down to 5 cigarettes a day.   Lab results (03/15/18) of CBC, CMP is as follows: all values are WNL except for RBC at 3.80, Hgb at 11.8, HCT at 34.7, Chloride at 114, CO2 at 20, BUN at 28, Creatinine at 2.02. Kappa/lambda 03/15/18 shows Kappa elevated at 130.9, Lambda WNL at 12.1, and K:L ratio elevated at 10.82.  MMP 03/15/18 shows no M spike  On review of systems, pt reports return to his normal weight, right middle finger numbness, and denies bone pains, mouth sores, abdominal pains, and any other  symptoms.   REVIEW OF SYSTEMS:   .10 Point review of Systems was done is negative except as noted above.  MEDICAL HISTORY:  Past Medical History:  Diagnosis Date  . BPH (benign prostatic hyperplasia)   . Hepatitis C 11/27/2017  . Hypertension   . 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 Tobacco abuse     SURGICAL HISTORY: Past Surgical History:  Procedure Laterality Date  . APPENDECTOMY    . FEMUR IM NAIL Right 11/20/2017   Procedure: RIGHT FEMORAL INTRAMEDULLARY (IM) NAIL;  Surgeon: Andres Stairs, MD;  Location: [facility];  Service: Orthopedics;  Laterality: Right;    SOCIAL HISTORY: Social History   Socioeconomic History  . Marital status: Single    Spouse name: Not on file  . Number of children: Not on file  . Years of education: Not on file  . Highest education level: Not on file  Occupational History  . Not on file  Social Needs  . Financial resource strain: Not on file  . Food insecurity:    Worry: Not on file    Inability: Not on file  . Transportation needs:    Medical: Not on file    Non-medical: Not on file  Tobacco Use  . Smoking status: Former Smoker    Packs/day: 0.50    Types: Cigarettes    Last attempt to quit: 11/09/2017    Years since quitting: 0.3  . Smokeless  tobacco: Never Used  . Tobacco comment: no cigarettes x 1 week.  Substance and Sexual Activity  . Alcohol use: Yes  . Drug use: No  . Sexual activity: Not on file  Lifestyle  . Physical activity:    Days per week: Not on file    Minutes per session: Not on file  . Stress: Not on file  Relationships  . Social connections:    Talks on phone: Not on file    Gets together: Not on file    Attends religious service: Not on file    Active member of club or organization: Not on file    Attends meetings of clubs or organizations: Not on file    Relationship status: Not on file  . Intimate partner violence:    Fear of current or ex partner: Not on file    Emotionally abused: Not  on file    Physically abused: Not on file    Forced sexual activity: Not on file  Other Topics Concern  . Not on file  Social History Narrative  . Not on file    FAMILY HISTORY: Family History  Problem Relation Age of Onset  . COPD Mother   . Liver cancer Mother     ALLERGIES:  has No Known Allergies.  MEDICATIONS:  . Current Outpatient Medications on File Prior to Visit  Medication Sig Dispense Refill  . acyclovir (ZOVIRAX) 400 MG tablet Take 1 tablet (400 mg total) by mouth daily. 30 tablet 6  . cholecalciferol (VITAMIN D) 400 units TABS tablet Take 400 Units by mouth daily.    . cyclophosphamide (CYTOXAN) 50 MG capsule Take 11 caps (578m) by mouth once weekly on days 1, 8, &15, every 21 days. Take with breakfast, maintain hydration. 33 capsule 2  . dexamethasone (DECADRON) 4 MG tablet Take 5 tablets (20 mg total) by mouth once a week. Every Saturday morning 20 tablet 1  . NICOTINE STEP 2 14 MG/24HR patch PLACE 1 PATCH (14 MG TOTAL) ONTO THE SKIN DAILY. 28 patch 1  . ondansetron (ZOFRAN) 4 MG tablet Take 2 tablets (8 mg total) by mouth every 8 (eight) hours as needed for nausea. (Patient not taking: Reported on 03/18/2018) 30 tablet 0   No current facility-administered medications on file prior to visit.     PHYSICAL EXAMINATION:  VS reviewed Vitals:   03/18/18 1139  BP: 134/80  Pulse: 95  Resp: 18  Temp: 98.5 F (36.9 C)  SpO2: 100%    . GENERAL:alert, in no acute distress and comfortable SKIN: no acute rashes, no significant lesions EYES: conjunctiva are pink and non-injected, sclera anicteric OROPHARYNX: MMM, no exudates, no oropharyngeal erythema or ulceration NECK: supple, no JVD LYMPH:  no palpable lymphadenopathy in the cervical, axillary or inguinal regions LUNGS: clear to auscultation b/l with normal respiratory effort HEART: regular rate & rhythm ABDOMEN:  normoactive bowel sounds , non tender, not distended. Extremity: no pedal edema PSYCH: alert &  oriented x 3 with fluent speech NEURO: no focal motor/sensory deficits  LABORATORY DATA:  I have reviewed the data as listed  . CBC Latest Ref Rng & Units 03/15/2018 03/01/2018 02/25/2018  WBC 4.0 - 10.3 K/uL 5.5 5.6 6.4  Hemoglobin 13.0 - 17.1 g/dL - - -  Hematocrit 38.4 - 49.9 % 34.7(L) 33.9(L) 34.0(L)  Platelets 140 - 400 K/uL 395 349 395  hgb 11.6 . CBC    Component Value Date/Time   WBC 5.5 03/15/2018 1118   WBC 7.3 02/22/2018  1233   RBC 3.80 (L) 03/15/2018 1118   HGB 11.3 (L) 02/22/2018 1233   HGB 8.3 (L) 12/14/2017 1230   HCT 34.7 (L) 03/15/2018 1118   HCT 23.9 (L) 12/14/2017 1230   PLT 395 03/15/2018 1118   PLT 524 (H) 12/14/2017 1230   MCV 91.2 03/15/2018 1118   MCV 88.4 12/14/2017 1230   MCH 30.9 03/15/2018 1118   MCHC 33.9 03/15/2018 1118   RDW 14.5 03/15/2018 1118   RDW 15.3 (H) 12/14/2017 1230   LYMPHSABS 1.4 03/15/2018 1118   LYMPHSABS 1.4 12/14/2017 1230   MONOABS 0.7 03/15/2018 1118   MONOABS 0.6 12/14/2017 1230   EOSABS 0.1 03/15/2018 1118   EOSABS 0.1 12/14/2017 1230   BASOSABS 0.1 03/15/2018 1118   BASOSABS 0.1 12/14/2017 1230     . CMP Latest Ref Rng & Units 03/15/2018 03/01/2018 02/25/2018  Glucose 70 - 140 mg/dL 99 81 94  BUN 7 - 26 mg/dL 28(H) 27(H) 27(H)  Creatinine 0.70 - 1.30 mg/dL 2.02(H) 2.12(H) 2.10(H)  Sodium 136 - 145 mmol/L 142 142 143  Potassium 3.5 - 5.1 mmol/L 4.6 5.2(H) 4.5  Chloride 98 - 109 mmol/L 114(H) 112(H) 113(H)  CO2 22 - 29 mmol/L 20(L) 23 21(L)  Calcium 8.4 - 10.4 mg/dL 8.4 9.1 8.6  Total Protein 6.4 - 8.3 g/dL 6.7 6.7 6.8  Total Bilirubin 0.2 - 1.2 mg/dL 0.3 0.2 0.3  Alkaline Phos 40 - 150 U/L 92 100 97  AST 5 - 34 U/L 25 27 31   ALT 0 - 55 U/L 34 32 34              RADIOGRAPHIC STUDIES: I have personally reviewed the radiological images as listed and agreed with the findings in the report. No results found.  ASSESSMENT & PLAN:   62 y.o.A male presenting with severe hypercalcemia, skeletal findings  consistent with myeloma and significant leukocytosis/lymphocytosis in the peripheral blood as well as AKI and hypercalcemia.  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 -Hypercalcemia -Renal Failure -Anemia -Extensive Bone lesions -Appears to be primarily Kappa Light chain Myeloma with no overt M spike. Appears to be responding to treatment.- with decreased kappa light chains.   BM Bx showed >95% involvement with kappa restricted serum free light chains. 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 MoL Cy-    PLAN -continue CyBorD treatment as per orders. -counseled to maintain compliance with f/u to Hep C clinic for evaluation and mx of recently noted Hep C -- this could potentially be a transplant consideration as well. -continue Acyclovir prophylaxis. -weekly labs on D1 AND D8 each cycle -Continue treatment as per orders -continue Xgeva q4weeks -Offered a referral to [facility] to evaluate the potential for an autologous BM transplant. The pt accepts this referral. - referral placed but has not followed up yet...notes financial concerns. -Discussed pt labwork today; blood counts and chemistries are stable. MMP is pending. Kappa light chains are continuing to decrease and are now at 130.9.  -We will see pt back in 3 weeks with labs  -No prohibitive treatment toxicities at this time.    2) s/p Prophylactic IM nailing for rt femur. -Advised that we will re-evaluate the patient returning to work by March on the next visit following Physical Therapy at the [facility].  -patient reports improvement and is planning to return to work part time.  3) Thrombocytopenia resolved  4)Renal insufficiency -- likely primarily related to Myeloma -Kidney  numbers improved. 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  7) Renal insuff- due to myeloma.  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 Some drop in hgb due to rt gluteal hematoma Plan -Hgb at 11.8 -monitor esp with addition of Cytoxan   -Please Schedule C7 of CyborD -continue Xgeva q4weeks RTC with Dr Irene Limbo in 3 weeks with labs (ok to overbook)   . The total time spent in the appointment was 20 minutes and more than 50% was on counseling and direct patient cares.    Andres Lone MD [place] AAHIVMS [facility] Hematology/Oncology Physician [facility]  (Office):       [phone] (Work cell):  [phone] (Fax):           [phone]  This document serves as a record of services personally performed by Andres Lone, MD. It was created on his behalf by Andres Fernandez, a trained medical scribe. The creation of this record is based on the scribe's personal observations and the provider's statements to them.   .I have reviewed the above documentation for accuracy and completeness, and I agree with the above. Andres Genera MD MS

## 2018-03-15 NOTE — Progress Notes (Signed)
Patient declined Zofran here today

## 2018-03-15 NOTE — Patient Instructions (Signed)
[facility] Discharge Instructions for Patients Receiving Chemotherapy  Today you received the following chemotherapy agent:  Velcade (bortezomib) To help prevent nausea and vomiting after your treatment, we encourage you to take your nausea medication as prescribed.   If you develop nausea and vomiting that is not controlled by your nausea medication, call the clinic.   BELOW ARE SYMPTOMS THAT SHOULD BE REPORTED IMMEDIATELY:  *FEVER GREATER THAN 100.5 F  *CHILLS WITH OR WITHOUT FEVER  NAUSEA AND VOMITING THAT IS NOT CONTROLLED WITH YOUR NAUSEA MEDICATION  *UNUSUAL SHORTNESS OF BREATH  *UNUSUAL BRUISING OR BLEEDING  TENDERNESS IN MOUTH AND THROAT WITH OR WITHOUT PRESENCE OF ULCERS  *URINARY PROBLEMS  *BOWEL PROBLEMS  UNUSUAL RASH Items with * indicate a potential emergency and should be followed up as soon as possible.  Feel free to call the clinic should you have any questions or concerns. The clinic phone number is [phone].  Please show the CHEMO ALERT CARD at check-in to the Emergency Department and triage nurse.  Denosumab injection (Xgeva) What is this medicine? DENOSUMAB (den oh sue mab) slows bone breakdown. Prolia is used to treat osteoporosis in women after menopause and in men. Xgeva is used to treat a high calcium level due to cancer and to prevent bone fractures and other bone problems caused by multiple myeloma or cancer bone metastases. Xgeva is also used to treat giant cell tumor of the bone. This medicine may be used for other purposes; ask your health care provider or pharmacist if you have questions. COMMON BRAND NAME(S): Prolia, XGEVA What should I tell my health care provider before I take this medicine? They need to know if you have any of these conditions: -dental disease -having surgery or tooth extraction -infection -kidney disease -low levels of calcium or Vitamin D in the blood -malnutrition -on hemodialysis -skin  conditions or sensitivity -thyroid or parathyroid disease -an unusual reaction to denosumab, other medicines, foods, dyes, or preservatives -pregnant or trying to get pregnant -breast-feeding How should I use this medicine? This medicine is for injection under the skin. It is given by a health care professional in a hospital or clinic setting. If you are getting Prolia, a special MedGuide will be given to you by the pharmacist with each prescription and refill. Be sure to read this information carefully each time. For Prolia, talk to your pediatrician regarding the use of this medicine in children. Special care may be needed. For Xgeva, talk to your pediatrician regarding the use of this medicine in children. While this drug may be prescribed for children as young as 13 years for selected conditions, precautions do apply. Overdosage: If you think you have taken too much of this medicine contact a poison control center or emergency room at once. NOTE: This medicine is only for you. Do not share this medicine with others. What if I miss a dose? It is important not to miss your dose. Call your doctor or health care professional if you are unable to keep an appointment. What may interact with this medicine? Do not take this medicine with any of the following medications: -other medicines containing denosumab This medicine may also interact with the following medications: -medicines that lower your chance of fighting infection -steroid medicines like prednisone or cortisone This list may not describe all possible interactions. Give your health care provider a list of all the medicines, herbs, non-prescription drugs, or dietary supplements you use. Also tell them if you smoke, drink alcohol, or   use illegal drugs. Some items may interact with your medicine. What should I watch for while using this medicine? Visit your doctor or health care professional for regular checks on your progress. Your doctor  or health care professional may order blood tests and other tests to see how you are doing. Call your doctor or health care professional for advice if you get a fever, chills or sore throat, or other symptoms of a cold or flu. Do not treat yourself. This drug may decrease your body's ability to fight infection. Try to avoid being around people who are sick. You should make sure you get enough calcium and vitamin D while you are taking this medicine, unless your doctor tells you not to. Discuss the foods you eat and the vitamins you take with your health care professional. See your dentist regularly. Brush and floss your teeth as directed. Before you have any dental work done, tell your dentist you are receiving this medicine. Do not become pregnant while taking this medicine or for 5 months after stopping it. Talk with your doctor or health care professional about your birth control options while taking this medicine. Women should inform their doctor if they wish to become pregnant or think they might be pregnant. There is a potential for serious side effects to an unborn child. Talk to your health care professional or pharmacist for more information. What side effects may I notice from receiving this medicine? Side effects that you should report to your doctor or health care professional as soon as possible: -allergic reactions like skin rash, itching or hives, swelling of the face, lips, or tongue -bone pain -breathing problems -dizziness -jaw pain, especially after dental work -redness, blistering, peeling of the skin -signs and symptoms of infection like fever or chills; cough; sore throat; pain or trouble passing urine -signs of low calcium like fast heartbeat, muscle cramps or muscle pain; pain, tingling, numbness in the hands or feet; seizures -unusual bleeding or bruising -unusually weak or tired Side effects that usually do not require medical attention (report to your doctor or health care  professional if they continue or are bothersome): -constipation -diarrhea -headache -joint pain -loss of appetite -muscle pain -runny nose -tiredness -upset stomach This list may not describe all possible side effects. Call your doctor for medical advice about side effects. You may report side effects to FDA at 1-800-FDA-1088. Where should I keep my medicine? This medicine is only given in a clinic, doctor's office, or other health care setting and will not be stored at home. NOTE: This sheet is a summary. It may not cover all possible information. If you have questions about this medicine, talk to your doctor, pharmacist, or health care provider.  2018 Elsevier/Gold Standard (2016-12-19 19:17:21)  

## 2018-03-15 NOTE — Progress Notes (Signed)
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

## 2018-03-18 ENCOUNTER — Inpatient Hospital Stay: Payer: BLUE CROSS/BLUE SHIELD

## 2018-03-18 ENCOUNTER — Inpatient Hospital Stay (HOSPITAL_BASED_OUTPATIENT_CLINIC_OR_DEPARTMENT_OTHER): Payer: BLUE CROSS/BLUE SHIELD | Admitting: Hematology

## 2018-03-18 ENCOUNTER — Encounter: Payer: Self-pay | Admitting: Hematology

## 2018-03-18 VITALS — BP 134/80 | HR 95 | Temp 98.5°F | Resp 18 | Ht 68.0 in | Wt 160.1 lb

## 2018-03-18 DIAGNOSIS — B1920 Unspecified viral hepatitis C without hepatic coma: Secondary | ICD-10-CM | POA: Diagnosis not present

## 2018-03-18 DIAGNOSIS — C9010 Plasma cell leukemia not having achieved remission: Secondary | ICD-10-CM | POA: Diagnosis not present

## 2018-03-18 DIAGNOSIS — R682 Dry mouth, unspecified: Secondary | ICD-10-CM | POA: Diagnosis not present

## 2018-03-18 DIAGNOSIS — R42 Dizziness and giddiness: Secondary | ICD-10-CM | POA: Diagnosis not present

## 2018-03-18 DIAGNOSIS — Z7189 Other specified counseling: Secondary | ICD-10-CM

## 2018-03-18 DIAGNOSIS — R5383 Other fatigue: Secondary | ICD-10-CM | POA: Diagnosis not present

## 2018-03-18 DIAGNOSIS — I10 Essential (primary) hypertension: Secondary | ICD-10-CM

## 2018-03-18 DIAGNOSIS — Z79899 Other long term (current) drug therapy: Secondary | ICD-10-CM

## 2018-03-18 DIAGNOSIS — N289 Disorder of kidney and ureter, unspecified: Secondary | ICD-10-CM

## 2018-03-18 DIAGNOSIS — R0602 Shortness of breath: Secondary | ICD-10-CM

## 2018-03-18 DIAGNOSIS — C9000 Multiple myeloma not having achieved remission: Secondary | ICD-10-CM

## 2018-03-18 DIAGNOSIS — F17210 Nicotine dependence, cigarettes, uncomplicated: Secondary | ICD-10-CM

## 2018-03-18 DIAGNOSIS — R200 Anesthesia of skin: Secondary | ICD-10-CM

## 2018-03-18 DIAGNOSIS — D638 Anemia in other chronic diseases classified elsewhere: Secondary | ICD-10-CM

## 2018-03-18 DIAGNOSIS — M899 Disorder of bone, unspecified: Secondary | ICD-10-CM | POA: Diagnosis not present

## 2018-03-18 DIAGNOSIS — N400 Enlarged prostate without lower urinary tract symptoms: Secondary | ICD-10-CM

## 2018-03-18 LAB — KAPPA/LAMBDA LIGHT CHAINS
KAPPA, LAMDA LIGHT CHAIN RATIO: 10.82 — AB (ref 0.26–1.65)
Kappa free light chain: 130.9 mg/L — ABNORMAL HIGH (ref 3.3–19.4)
Lambda free light chains: 12.1 mg/L (ref 5.7–26.3)

## 2018-03-18 MED ORDER — ONDANSETRON HCL 8 MG PO TABS
4.0000 mg | ORAL_TABLET | Freq: Once | ORAL | Status: AC
  Administered 2018-03-18: 4 mg via ORAL

## 2018-03-18 MED ORDER — BORTEZOMIB CHEMO SQ INJECTION 3.5 MG (2.5MG/ML)
1.3000 mg/m2 | Freq: Once | INTRAMUSCULAR | Status: AC
  Administered 2018-03-18: 2.25 mg via SUBCUTANEOUS
  Filled 2018-03-18: qty 2.25

## 2018-03-18 MED ORDER — ONDANSETRON HCL 8 MG PO TABS
ORAL_TABLET | ORAL | Status: AC
  Filled 2018-03-18: qty 1

## 2018-03-18 NOTE — Patient Instructions (Signed)
[facility] Discharge Instructions for Patients Receiving Chemotherapy  Today you received the following chemotherapy agent:  Velcade (bortezomib) To help prevent nausea and vomiting after your treatment, we encourage you to take your nausea medication as prescribed.   If you develop nausea and vomiting that is not controlled by your nausea medication, call the clinic.   BELOW ARE SYMPTOMS THAT SHOULD BE REPORTED IMMEDIATELY:  *FEVER GREATER THAN 100.5 F  *CHILLS WITH OR WITHOUT FEVER  NAUSEA AND VOMITING THAT IS NOT CONTROLLED WITH YOUR NAUSEA MEDICATION  *UNUSUAL SHORTNESS OF BREATH  *UNUSUAL BRUISING OR BLEEDING  TENDERNESS IN MOUTH AND THROAT WITH OR WITHOUT PRESENCE OF ULCERS  *URINARY PROBLEMS  *BOWEL PROBLEMS  UNUSUAL RASH Items with * indicate a potential emergency and should be followed up as soon as possible.  Feel free to call the clinic should you have any questions or concerns. The clinic phone number is [phone].  Please show the CHEMO ALERT CARD at check-in to the Emergency Department and triage nurse.  Denosumab injection (Xgeva) What is this medicine? DENOSUMAB (den oh sue mab) slows bone breakdown. Prolia is used to treat osteoporosis in women after menopause and in men. Xgeva is used to treat a high calcium level due to cancer and to prevent bone fractures and other bone problems caused by multiple myeloma or cancer bone metastases. Xgeva is also used to treat giant cell tumor of the bone. This medicine may be used for other purposes; ask your health care provider or pharmacist if you have questions. COMMON BRAND NAME(S): Prolia, XGEVA What should I tell my health care provider before I take this medicine? They need to know if you have any of these conditions: -dental disease -having surgery or tooth extraction -infection -kidney disease -low levels of calcium or Vitamin D in the blood -malnutrition -on hemodialysis -skin  conditions or sensitivity -thyroid or parathyroid disease -an unusual reaction to denosumab, other medicines, foods, dyes, or preservatives -pregnant or trying to get pregnant -breast-feeding How should I use this medicine? This medicine is for injection under the skin. It is given by a health care professional in a hospital or clinic setting. If you are getting Prolia, a special MedGuide will be given to you by the pharmacist with each prescription and refill. Be sure to read this information carefully each time. For Prolia, talk to your pediatrician regarding the use of this medicine in children. Special care may be needed. For Xgeva, talk to your pediatrician regarding the use of this medicine in children. While this drug may be prescribed for children as young as 13 years for selected conditions, precautions do apply. Overdosage: If you think you have taken too much of this medicine contact a poison control center or emergency room at once. NOTE: This medicine is only for you. Do not share this medicine with others. What if I miss a dose? It is important not to miss your dose. Call your doctor or health care professional if you are unable to keep an appointment. What may interact with this medicine? Do not take this medicine with any of the following medications: -other medicines containing denosumab This medicine may also interact with the following medications: -medicines that lower your chance of fighting infection -steroid medicines like prednisone or cortisone This list may not describe all possible interactions. Give your health care provider a list of all the medicines, herbs, non-prescription drugs, or dietary supplements you use. Also tell them if you smoke, drink alcohol, or   use illegal drugs. Some items may interact with your medicine. What should I watch for while using this medicine? Visit your doctor or health care professional for regular checks on your progress. Your doctor  or health care professional may order blood tests and other tests to see how you are doing. Call your doctor or health care professional for advice if you get a fever, chills or sore throat, or other symptoms of a cold or flu. Do not treat yourself. This drug may decrease your body's ability to fight infection. Try to avoid being around people who are sick. You should make sure you get enough calcium and vitamin D while you are taking this medicine, unless your doctor tells you not to. Discuss the foods you eat and the vitamins you take with your health care professional. See your dentist regularly. Brush and floss your teeth as directed. Before you have any dental work done, tell your dentist you are receiving this medicine. Do not become pregnant while taking this medicine or for 5 months after stopping it. Talk with your doctor or health care professional about your birth control options while taking this medicine. Women should inform their doctor if they wish to become pregnant or think they might be pregnant. There is a potential for serious side effects to an unborn child. Talk to your health care professional or pharmacist for more information. What side effects may I notice from receiving this medicine? Side effects that you should report to your doctor or health care professional as soon as possible: -allergic reactions like skin rash, itching or hives, swelling of the face, lips, or tongue -bone pain -breathing problems -dizziness -jaw pain, especially after dental work -redness, blistering, peeling of the skin -signs and symptoms of infection like fever or chills; cough; sore throat; pain or trouble passing urine -signs of low calcium like fast heartbeat, muscle cramps or muscle pain; pain, tingling, numbness in the hands or feet; seizures -unusual bleeding or bruising -unusually weak or tired Side effects that usually do not require medical attention (report to your doctor or health care  professional if they continue or are bothersome): -constipation -diarrhea -headache -joint pain -loss of appetite -muscle pain -runny nose -tiredness -upset stomach This list may not describe all possible side effects. Call your doctor for medical advice about side effects. You may report side effects to FDA at 1-800-FDA-1088. Where should I keep my medicine? This medicine is only given in a clinic, doctor's office, or other health care setting and will not be stored at home. NOTE: This sheet is a summary. It may not cover all possible information. If you have questions about this medicine, talk to your doctor, pharmacist, or health care provider.  2018 Elsevier/Gold Standard (2016-12-19 19:17:21)  

## 2018-03-19 ENCOUNTER — Telehealth: Payer: Self-pay

## 2018-03-19 NOTE — Telephone Encounter (Signed)
Left a detailed voice message for patient concerning additional appointments added to current schedule. Per 4/8 los

## 2018-03-19 NOTE — Telephone Encounter (Signed)
Spoke with patient concerning new appointments that was added to his current scheduled. Asked him to come to scheduling and pick up a new copy of his calender on his visit on the 12th. Per 4/9 return call to patient.

## 2018-03-20 LAB — MULTIPLE MYELOMA PANEL, SERUM
ALBUMIN SERPL ELPH-MCNC: 3.8 g/dL (ref 2.9–4.4)
ALPHA 1: 0.2 g/dL (ref 0.0–0.4)
ALPHA2 GLOB SERPL ELPH-MCNC: 0.9 g/dL (ref 0.4–1.0)
Albumin/Glob SerPl: 1.5 (ref 0.7–1.7)
B-GLOBULIN SERPL ELPH-MCNC: 0.8 g/dL (ref 0.7–1.3)
GAMMA GLOB SERPL ELPH-MCNC: 0.6 g/dL (ref 0.4–1.8)
GLOBULIN, TOTAL: 2.6 g/dL (ref 2.2–3.9)
IGG (IMMUNOGLOBIN G), SERUM: 732 mg/dL (ref 700–1600)
IgA: 17 mg/dL — ABNORMAL LOW (ref 90–386)
IgM (Immunoglobulin M), Srm: 16 mg/dL — ABNORMAL LOW (ref 20–172)
Total Protein ELP: 6.4 g/dL (ref 6.0–8.5)

## 2018-03-22 ENCOUNTER — Other Ambulatory Visit: Payer: BLUE CROSS/BLUE SHIELD

## 2018-03-22 ENCOUNTER — Ambulatory Visit: Payer: BLUE CROSS/BLUE SHIELD | Admitting: Adult Health

## 2018-03-22 ENCOUNTER — Telehealth: Payer: Self-pay

## 2018-03-22 ENCOUNTER — Inpatient Hospital Stay: Payer: BLUE CROSS/BLUE SHIELD

## 2018-03-22 VITALS — BP 135/88 | HR 90 | Temp 97.7°F | Resp 18

## 2018-03-22 DIAGNOSIS — C9010 Plasma cell leukemia not having achieved remission: Secondary | ICD-10-CM

## 2018-03-22 DIAGNOSIS — Z7189 Other specified counseling: Secondary | ICD-10-CM

## 2018-03-22 DIAGNOSIS — C9000 Multiple myeloma not having achieved remission: Secondary | ICD-10-CM

## 2018-03-22 LAB — CMP (CANCER CENTER ONLY)
ALBUMIN: 3.7 g/dL (ref 3.5–5.0)
ALT: 38 U/L (ref 0–55)
ANION GAP: 9 (ref 3–11)
AST: 36 U/L — ABNORMAL HIGH (ref 5–34)
Alkaline Phosphatase: 86 U/L (ref 40–150)
BUN: 29 mg/dL — ABNORMAL HIGH (ref 7–26)
CHLORIDE: 108 mmol/L (ref 98–109)
CO2: 24 mmol/L (ref 22–29)
Calcium: 8.6 mg/dL (ref 8.4–10.4)
Creatinine: 2.16 mg/dL — ABNORMAL HIGH (ref 0.70–1.30)
GFR, Est AFR Am: 36 mL/min — ABNORMAL LOW (ref >60)
GFR, Estimated: 31 mL/min — ABNORMAL LOW (ref >60)
GLUCOSE: 60 mg/dL — AB (ref 70–140)
POTASSIUM: 4.9 mmol/L (ref 3.5–5.1)
Sodium: 141 mmol/L (ref 136–145)
TOTAL PROTEIN: 6.9 g/dL (ref 6.4–8.3)

## 2018-03-22 LAB — RETICULOCYTES
RBC.: 3.82 MIL/uL — AB (ref 4.20–5.82)
RETIC COUNT ABSOLUTE: 26.7 10*3/uL — AB (ref 34.8–93.9)
Retic Ct Pct: 0.7 % — ABNORMAL LOW (ref 0.8–1.8)

## 2018-03-22 LAB — CBC WITH DIFFERENTIAL/PLATELET
BASOS PCT: 1 %
Basophils Absolute: 0.1 10*3/uL (ref 0.0–0.1)
Eosinophils Absolute: 0.2 10*3/uL (ref 0.0–0.5)
Eosinophils Relative: 3 %
HCT: 34.3 % — ABNORMAL LOW (ref 38.4–49.9)
HEMOGLOBIN: 11.8 g/dL — AB (ref 13.0–17.1)
Lymphocytes Relative: 32 %
Lymphs Abs: 1.7 10*3/uL (ref 0.9–3.3)
MCH: 30.9 pg (ref 27.2–33.4)
MCHC: 34.4 g/dL (ref 32.0–36.0)
MCV: 89.8 fL (ref 79.3–98.0)
Monocytes Absolute: 1.3 10*3/uL — ABNORMAL HIGH (ref 0.1–0.9)
Monocytes Relative: 25 %
NEUTROS ABS: 2 10*3/uL (ref 1.5–6.5)
NEUTROS PCT: 39 %
Platelets: 273 10*3/uL (ref 140–400)
RBC: 3.82 MIL/uL — AB (ref 4.20–5.82)
RDW: 14.1 % (ref 11.0–14.6)
WBC: 5.2 10*3/uL (ref 4.0–10.3)

## 2018-03-22 MED ORDER — BORTEZOMIB CHEMO SQ INJECTION 3.5 MG (2.5MG/ML)
1.3000 mg/m2 | Freq: Once | INTRAMUSCULAR | Status: AC
  Administered 2018-03-22: 2.25 mg via SUBCUTANEOUS
  Filled 2018-03-22: qty 2.25

## 2018-03-22 MED ORDER — ONDANSETRON HCL 8 MG PO TABS
4.0000 mg | ORAL_TABLET | Freq: Once | ORAL | Status: AC
  Administered 2018-03-22: 4 mg via ORAL

## 2018-03-22 MED ORDER — ONDANSETRON HCL 8 MG PO TABS
ORAL_TABLET | ORAL | Status: AC
  Filled 2018-03-22: qty 1

## 2018-03-22 NOTE — Patient Instructions (Signed)
[facility] Discharge Instructions for Patients Receiving Chemotherapy  Today you received the following chemotherapy agent:  Velcade (bortezomib) To help prevent nausea and vomiting after your treatment, we encourage you to take your nausea medication as prescribed.   If you develop nausea and vomiting that is not controlled by your nausea medication, call the clinic.   BELOW ARE SYMPTOMS THAT SHOULD BE REPORTED IMMEDIATELY:  *FEVER GREATER THAN 100.5 F  *CHILLS WITH OR WITHOUT FEVER  NAUSEA AND VOMITING THAT IS NOT CONTROLLED WITH YOUR NAUSEA MEDICATION  *UNUSUAL SHORTNESS OF BREATH  *UNUSUAL BRUISING OR BLEEDING  TENDERNESS IN MOUTH AND THROAT WITH OR WITHOUT PRESENCE OF ULCERS  *URINARY PROBLEMS  *BOWEL PROBLEMS  UNUSUAL RASH Items with * indicate a potential emergency and should be followed up as soon as possible.  Feel free to call the clinic should you have any questions or concerns. The clinic phone number is [phone].  Please show the CHEMO ALERT CARD at check-in to the Emergency Department and triage nurse.  Denosumab injection (Xgeva) What is this medicine? DENOSUMAB (den oh sue mab) slows bone breakdown. Prolia is used to treat osteoporosis in women after menopause and in men. Xgeva is used to treat a high calcium level due to cancer and to prevent bone fractures and other bone problems caused by multiple myeloma or cancer bone metastases. Xgeva is also used to treat giant cell tumor of the bone. This medicine may be used for other purposes; ask your health care provider or pharmacist if you have questions. COMMON BRAND NAME(S): Prolia, XGEVA What should I tell my health care provider before I take this medicine? They need to know if you have any of these conditions: -dental disease -having surgery or tooth extraction -infection -kidney disease -low levels of calcium or Vitamin D in the blood -malnutrition -on hemodialysis -skin  conditions or sensitivity -thyroid or parathyroid disease -an unusual reaction to denosumab, other medicines, foods, dyes, or preservatives -pregnant or trying to get pregnant -breast-feeding How should I use this medicine? This medicine is for injection under the skin. It is given by a health care professional in a hospital or clinic setting. If you are getting Prolia, a special MedGuide will be given to you by the pharmacist with each prescription and refill. Be sure to read this information carefully each time. For Prolia, talk to your pediatrician regarding the use of this medicine in children. Special care may be needed. For Xgeva, talk to your pediatrician regarding the use of this medicine in children. While this drug may be prescribed for children as young as 13 years for selected conditions, precautions do apply. Overdosage: If you think you have taken too much of this medicine contact a poison control center or emergency room at once. NOTE: This medicine is only for you. Do not share this medicine with others. What if I miss a dose? It is important not to miss your dose. Call your doctor or health care professional if you are unable to keep an appointment. What may interact with this medicine? Do not take this medicine with any of the following medications: -other medicines containing denosumab This medicine may also interact with the following medications: -medicines that lower your chance of fighting infection -steroid medicines like prednisone or cortisone This list may not describe all possible interactions. Give your health care provider a list of all the medicines, herbs, non-prescription drugs, or dietary supplements you use. Also tell them if you smoke, drink alcohol, or   use illegal drugs. Some items may interact with your medicine. What should I watch for while using this medicine? Visit your doctor or health care professional for regular checks on your progress. Your doctor  or health care professional may order blood tests and other tests to see how you are doing. Call your doctor or health care professional for advice if you get a fever, chills or sore throat, or other symptoms of a cold or flu. Do not treat yourself. This drug may decrease your body's ability to fight infection. Try to avoid being around people who are sick. You should make sure you get enough calcium and vitamin D while you are taking this medicine, unless your doctor tells you not to. Discuss the foods you eat and the vitamins you take with your health care professional. See your dentist regularly. Brush and floss your teeth as directed. Before you have any dental work done, tell your dentist you are receiving this medicine. Do not become pregnant while taking this medicine or for 5 months after stopping it. Talk with your doctor or health care professional about your birth control options while taking this medicine. Women should inform their doctor if they wish to become pregnant or think they might be pregnant. There is a potential for serious side effects to an unborn child. Talk to your health care professional or pharmacist for more information. What side effects may I notice from receiving this medicine? Side effects that you should report to your doctor or health care professional as soon as possible: -allergic reactions like skin rash, itching or hives, swelling of the face, lips, or tongue -bone pain -breathing problems -dizziness -jaw pain, especially after dental work -redness, blistering, peeling of the skin -signs and symptoms of infection like fever or chills; cough; sore throat; pain or trouble passing urine -signs of low calcium like fast heartbeat, muscle cramps or muscle pain; pain, tingling, numbness in the hands or feet; seizures -unusual bleeding or bruising -unusually weak or tired Side effects that usually do not require medical attention (report to your doctor or health care  professional if they continue or are bothersome): -constipation -diarrhea -headache -joint pain -loss of appetite -muscle pain -runny nose -tiredness -upset stomach This list may not describe all possible side effects. Call your doctor for medical advice about side effects. You may report side effects to FDA at 1-800-FDA-1088. Where should I keep my medicine? This medicine is only given in a clinic, doctor's office, or other health care setting and will not be stored at home. NOTE: This sheet is a summary. It may not cover all possible information. If you have questions about this medicine, talk to your doctor, pharmacist, or health care provider.  2018 Elsevier/Gold Standard (2016-12-19 19:17:21)  

## 2018-03-22 NOTE — Progress Notes (Signed)
Ok to treat with today's labs per Dr. Irene Limbo.

## 2018-03-24 IMAGING — US US RENAL
1 series · 14 of 25 positions shown · non-contrast
Comparison: None.

CLINICAL DATA: Acute renal injury

EXAM:
RENAL / URINARY TRACT ULTRASOUND COMPLETE

[Series 1: us renal · 0.26mm/px · 14 of 42 slices shown]
[im 1/42]
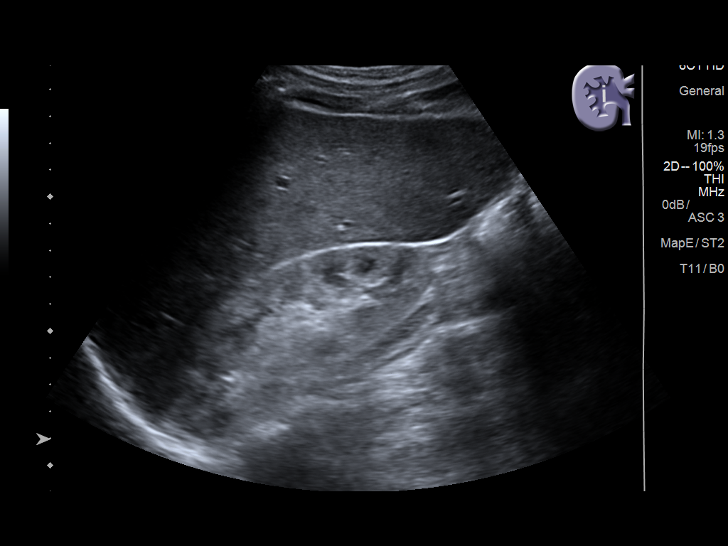
[im 4/42]
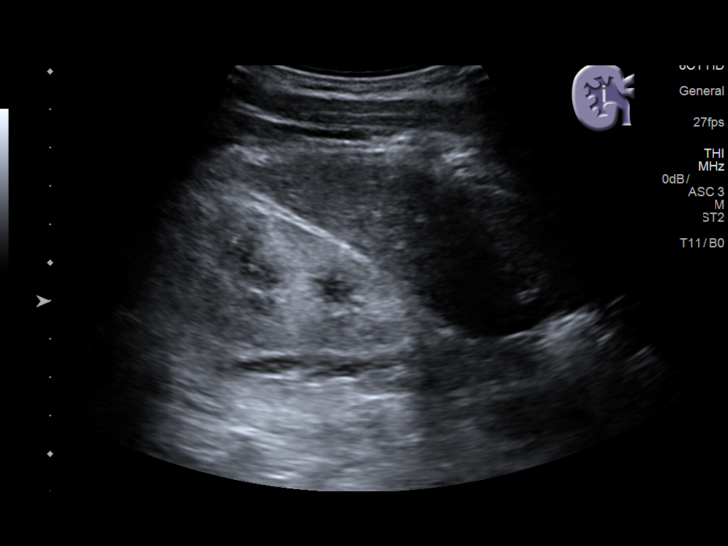
[im 7/42]
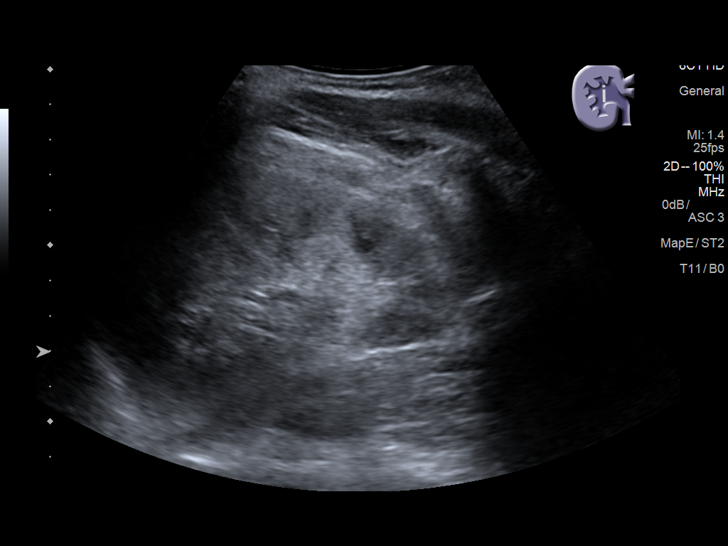
[im 11/42]
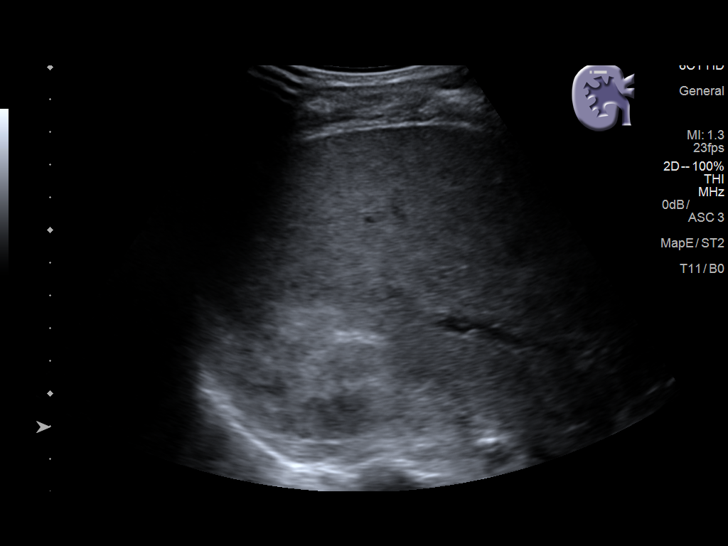
[im 14/42]
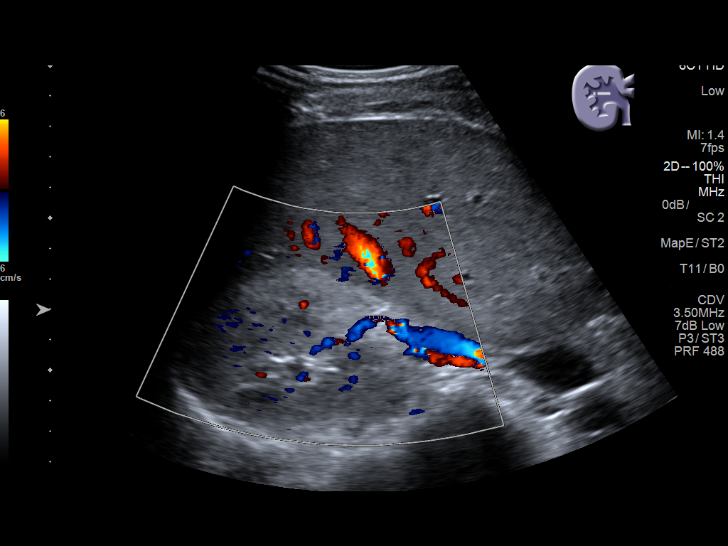
[im 16/42]
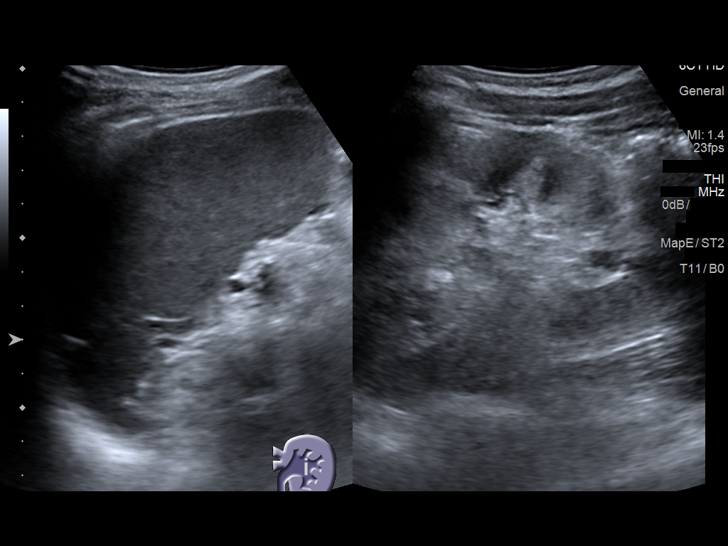
[im 19/42]
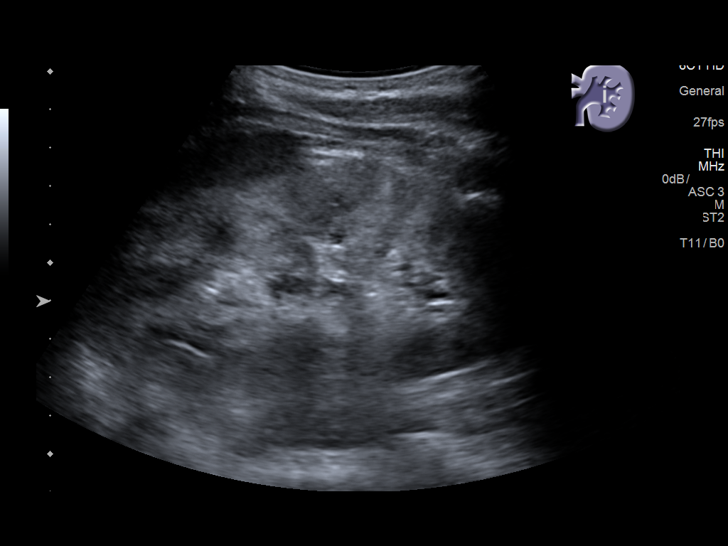
[im 23/42]
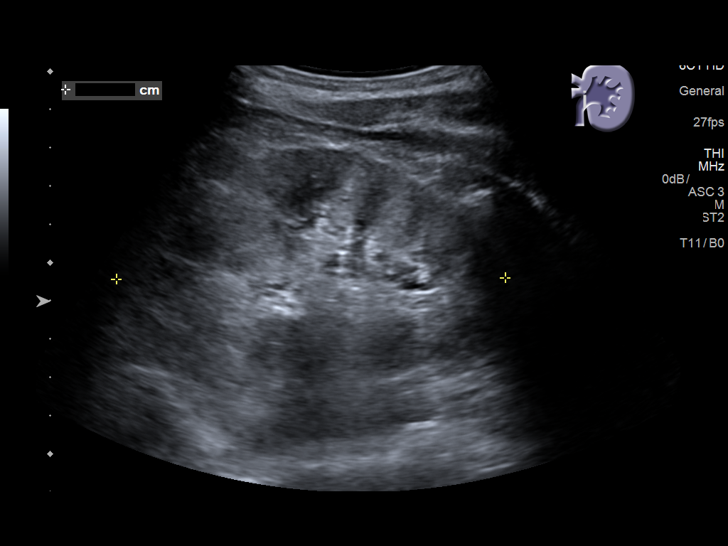
[im 26/42]
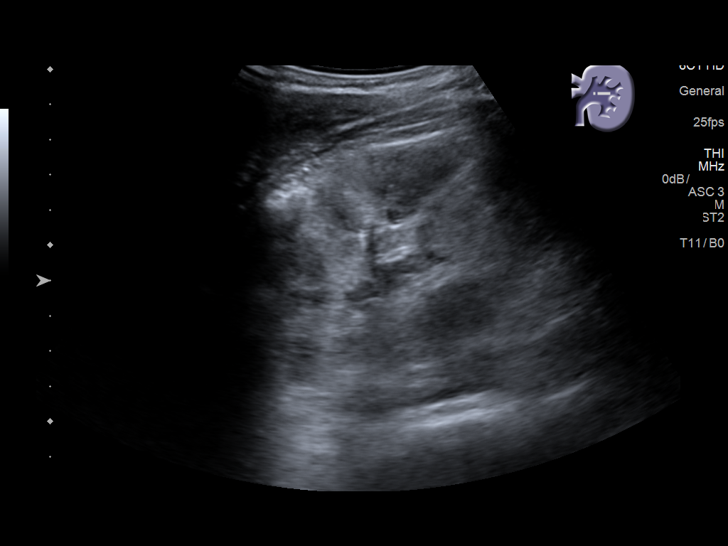
[im 28/42]
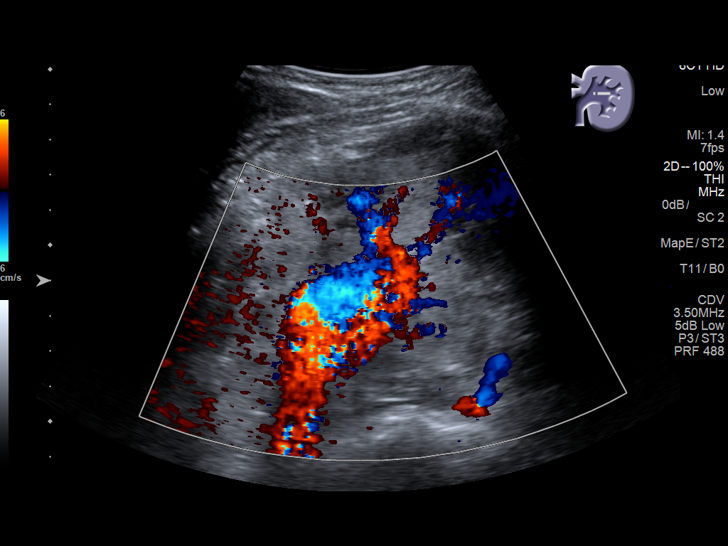
[im 31/42]
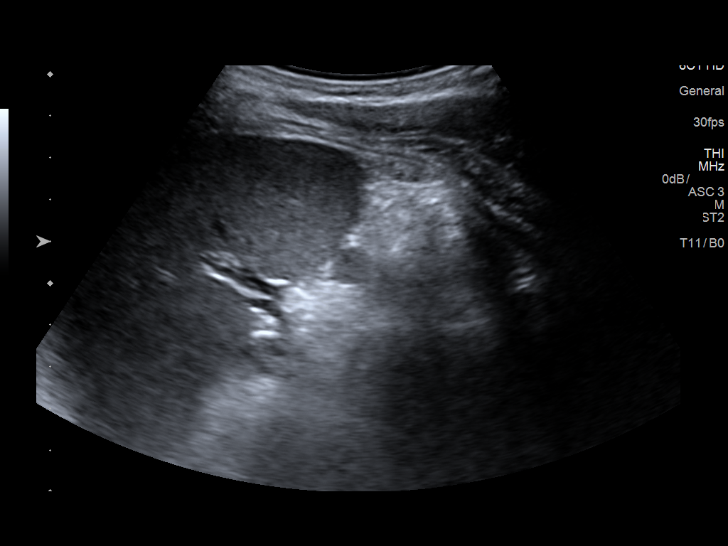
[im 35/42]
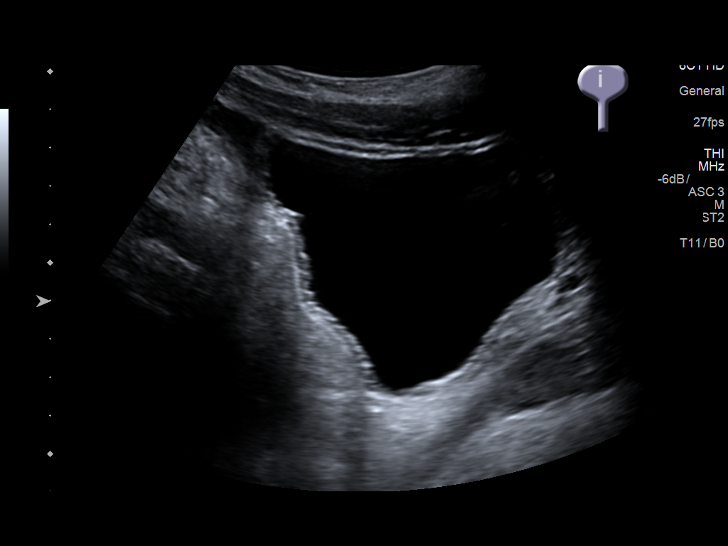
[im 38/42]
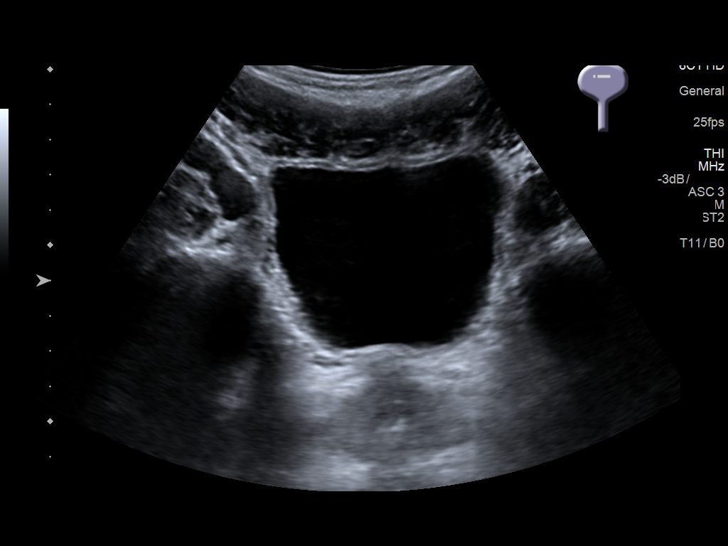
[im 42/42]
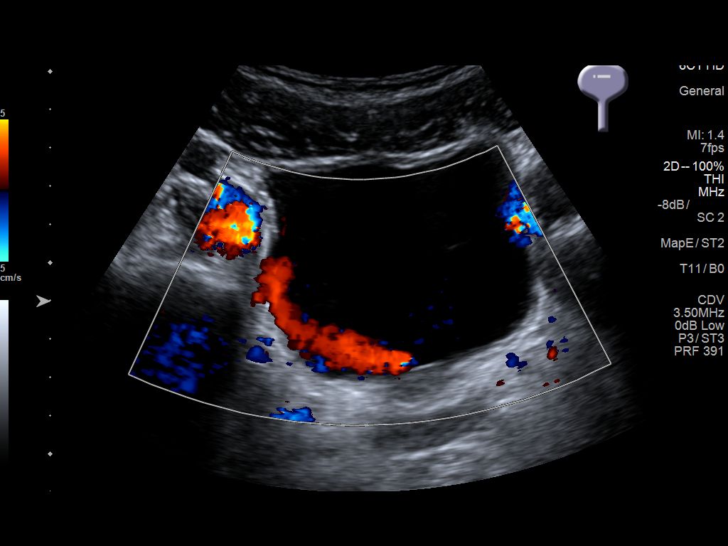

[14 of 25 positions shown; findings below may reference images not displayed]

FINDINGS: Right Kidney:

Length: 10.3 cm. Increased cortical echogenicity. No hydronephrosis.
Than renal cortex.

Left Kidney:

Length: 10.2 cm. Increased cortical echogenicity. No hydronephrosis.
Thinned renal cortex.

Bladder:

Appears normal for degree of bladder distention.
IMPRESSION: 1. No hydronephrosis.
2. Increased cortical echogenicity and thinned renal cortex
suggesting medical renal disease .

## 2018-03-25 ENCOUNTER — Inpatient Hospital Stay: Payer: BLUE CROSS/BLUE SHIELD

## 2018-03-25 VITALS — BP 131/87 | HR 86 | Temp 98.4°F | Resp 17 | Ht 68.0 in | Wt 160.2 lb

## 2018-03-25 DIAGNOSIS — C9010 Plasma cell leukemia not having achieved remission: Secondary | ICD-10-CM

## 2018-03-25 DIAGNOSIS — Z7189 Other specified counseling: Secondary | ICD-10-CM

## 2018-03-25 MED ORDER — ONDANSETRON HCL 8 MG PO TABS
ORAL_TABLET | ORAL | Status: AC
Start: 2018-03-25 — End: 2018-03-25
  Filled 2018-03-25: qty 1

## 2018-03-25 MED ORDER — BORTEZOMIB CHEMO SQ INJECTION 3.5 MG (2.5MG/ML)
1.3000 mg/m2 | Freq: Once | INTRAMUSCULAR | Status: AC
  Administered 2018-03-25: 2.25 mg via SUBCUTANEOUS
  Filled 2018-03-25: qty 2.25

## 2018-03-25 MED ORDER — ONDANSETRON HCL 4 MG/2ML IJ SOLN
INTRAMUSCULAR | Status: AC
  Filled 2018-03-25: qty 2

## 2018-03-25 MED ORDER — ONDANSETRON HCL 8 MG PO TABS
4.0000 mg | ORAL_TABLET | Freq: Once | ORAL | Status: AC
  Administered 2018-03-25: 4 mg via ORAL

## 2018-03-25 NOTE — Patient Instructions (Signed)
[facility] Discharge Instructions for Patients Receiving Chemotherapy  Today you received the following chemotherapy agents Velcade To help prevent nausea and vomiting after your treatment, we encourage you to take your nausea medication as prescribed.   If you develop nausea and vomiting that is not controlled by your nausea medication, call the clinic.   BELOW ARE SYMPTOMS THAT SHOULD BE REPORTED IMMEDIATELY:  *FEVER GREATER THAN 100.5 F  *CHILLS WITH OR WITHOUT FEVER  NAUSEA AND VOMITING THAT IS NOT CONTROLLED WITH YOUR NAUSEA MEDICATION  *UNUSUAL SHORTNESS OF BREATH  *UNUSUAL BRUISING OR BLEEDING  TENDERNESS IN MOUTH AND THROAT WITH OR WITHOUT PRESENCE OF ULCERS  *URINARY PROBLEMS  *BOWEL PROBLEMS  UNUSUAL RASH Items with * indicate a potential emergency and should be followed up as soon as possible.  Feel free to call the clinic should you have any questions or concerns. The clinic phone number is [phone].  Please show the CHEMO ALERT CARD at check-in to the Emergency Department and triage nurse.   

## 2018-04-01 NOTE — Telephone Encounter (Signed)
Error opening  

## 2018-04-02 ENCOUNTER — Other Ambulatory Visit: Payer: Self-pay | Admitting: Hematology and Oncology

## 2018-04-02 DIAGNOSIS — C9000 Multiple myeloma not having achieved remission: Secondary | ICD-10-CM

## 2018-04-02 MED ORDER — CYCLOPHOSPHAMIDE 50 MG PO CAPS: ORAL_CAPSULE | ORAL | 0 refills | Status: DC

## 2018-04-02 NOTE — Telephone Encounter (Signed)
Error opening  

## 2018-04-05 ENCOUNTER — Inpatient Hospital Stay: Payer: BLUE CROSS/BLUE SHIELD

## 2018-04-05 ENCOUNTER — Ambulatory Visit: Payer: BLUE CROSS/BLUE SHIELD

## 2018-04-05 VITALS — BP 126/88 | HR 84 | Temp 97.9°F | Resp 17

## 2018-04-05 DIAGNOSIS — C9000 Multiple myeloma not having achieved remission: Secondary | ICD-10-CM

## 2018-04-05 DIAGNOSIS — C9010 Plasma cell leukemia not having achieved remission: Secondary | ICD-10-CM

## 2018-04-05 DIAGNOSIS — Z7189 Other specified counseling: Secondary | ICD-10-CM

## 2018-04-05 LAB — CMP (CANCER CENTER ONLY)
ALT: 36 U/L (ref 0–55)
ANION GAP: 9 (ref 3–11)
AST: 27 U/L (ref 5–34)
Albumin: 4.1 g/dL (ref 3.5–5.0)
Alkaline Phosphatase: 81 U/L (ref 40–150)
BUN: 30 mg/dL — ABNORMAL HIGH (ref 7–26)
CHLORIDE: 114 mmol/L — AB (ref 98–109)
CO2: 20 mmol/L — ABNORMAL LOW (ref 22–29)
CREATININE: 1.94 mg/dL — AB (ref 0.70–1.30)
Calcium: 8.8 mg/dL (ref 8.4–10.4)
GFR, EST AFRICAN AMERICAN: 42 mL/min — AB (ref >60)
GFR, EST NON AFRICAN AMERICAN: 36 mL/min — AB (ref >60)
Glucose, Bld: 101 mg/dL (ref 70–140)
POTASSIUM: 5.1 mmol/L (ref 3.5–5.1)
Sodium: 143 mmol/L (ref 136–145)
Total Bilirubin: 0.3 mg/dL (ref 0.2–1.2)
Total Protein: 7.3 g/dL (ref 6.4–8.3)

## 2018-04-05 LAB — CBC WITH DIFFERENTIAL/PLATELET
BASOS ABS: 0 10*3/uL (ref 0.0–0.1)
Basophils Relative: 1 %
EOS PCT: 3 %
Eosinophils Absolute: 0.2 10*3/uL (ref 0.0–0.5)
HEMATOCRIT: 35.2 % — AB (ref 38.4–49.9)
Hemoglobin: 12.1 g/dL — ABNORMAL LOW (ref 13.0–17.1)
LYMPHS ABS: 1.2 10*3/uL (ref 0.9–3.3)
LYMPHS PCT: 21 %
MCH: 30.7 pg (ref 27.2–33.4)
MCHC: 34.4 g/dL (ref 32.0–36.0)
MCV: 89.3 fL (ref 79.3–98.0)
MONO ABS: 0.8 10*3/uL (ref 0.1–0.9)
Monocytes Relative: 13 %
NEUTROS PCT: 62 %
Neutro Abs: 3.7 10*3/uL (ref 1.5–6.5)
Platelets: 375 10*3/uL (ref 140–400)
RBC: 3.94 MIL/uL — AB (ref 4.20–5.82)
RDW: 13.9 % (ref 11.0–14.6)
WBC: 5.9 10*3/uL (ref 4.0–10.3)

## 2018-04-05 LAB — RETICULOCYTES
RBC.: 3.94 MIL/uL — ABNORMAL LOW (ref 4.20–5.82)
RETIC COUNT ABSOLUTE: 27.6 10*3/uL — AB (ref 34.8–93.9)
Retic Ct Pct: 0.7 % — ABNORMAL LOW (ref 0.8–1.8)

## 2018-04-05 MED ORDER — ONDANSETRON HCL 8 MG PO TABS
4.0000 mg | ORAL_TABLET | Freq: Once | ORAL | Status: AC
  Administered 2018-04-05: 4 mg via ORAL

## 2018-04-05 MED ORDER — BORTEZOMIB CHEMO SQ INJECTION 3.5 MG (2.5MG/ML)
1.3000 mg/m2 | Freq: Once | INTRAMUSCULAR | Status: AC
  Administered 2018-04-05: 2.25 mg via SUBCUTANEOUS
  Filled 2018-04-05: qty 2.25

## 2018-04-05 MED ORDER — ONDANSETRON HCL 8 MG PO TABS
ORAL_TABLET | ORAL | Status: AC
  Filled 2018-04-05: qty 1

## 2018-04-05 NOTE — Progress Notes (Signed)
Per Dr Lebron Conners ok to tx today with creatinine of 1.94.

## 2018-04-05 NOTE — Patient Instructions (Signed)
[facility] Discharge Instructions for Patients Receiving Chemotherapy  Today you received the following chemotherapy agents Velcade To help prevent nausea and vomiting after your treatment, we encourage you to take your nausea medication as prescribed.   If you develop nausea and vomiting that is not controlled by your nausea medication, call the clinic.   BELOW ARE SYMPTOMS THAT SHOULD BE REPORTED IMMEDIATELY:  *FEVER GREATER THAN 100.5 F  *CHILLS WITH OR WITHOUT FEVER  NAUSEA AND VOMITING THAT IS NOT CONTROLLED WITH YOUR NAUSEA MEDICATION  *UNUSUAL SHORTNESS OF BREATH  *UNUSUAL BRUISING OR BLEEDING  TENDERNESS IN MOUTH AND THROAT WITH OR WITHOUT PRESENCE OF ULCERS  *URINARY PROBLEMS  *BOWEL PROBLEMS  UNUSUAL RASH Items with * indicate a potential emergency and should be followed up as soon as possible.  Feel free to call the clinic should you have any questions or concerns. The clinic phone number is [phone].  Please show the CHEMO ALERT CARD at check-in to the Emergency Department and triage nurse.   

## 2018-04-07 NOTE — Progress Notes (Signed)
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 His labs today 12/14/2017 show hgb 8.3, platelets 524k. He is doing well overall. He has staples in his right upper leg following a recent surgery and has not been in touch with the surgeons for a f/u of this. He is not receiving physical therapy and is not getting around at home.  He notes that he was not scheduled for a post hospitalization nephrology f/u and has no PCP.  On review of systems, pt reports intermittent fatigue, SOB, pain to the left leg, dizziness, dry mouth and denies fever, chills, night sweats, dysuria and any other accompanying symptoms.   INTERVAL HISTORY:   Andres Fernandez presents today for follow up and C7D4 of treatment of his myeloma/plasma cell leukemia. The patient's last visit with [name] was on 03/18/18. The pt reports that he is doing well overall.   The pt reports that he will be seeing the hepatitis C clinic on May 1. He also has a kidney appointment on May 3. He notes that he continues in his alcohol sobriety. He also notes that he has been working part time   Lab results today (04/08/18) of CBC, CMP, and Reticulocytes is as follows: all values are WNL except for RBC at 4.01, Hgb at 12.2, HCT at 36.2, Monocytes at 1.0k, Chloride at 112, CO2 at 21, BUN at 31, Creatinine at 1.99. MMP 04/05/18 shows no M spike and involved Kappa SFLC continues to improve and is down to 121.5.  On review of systems, pt reports good energy levels and denies tingling and numbness in his hands and feet, abdominal pains, leg swelling, and any other symptoms.   REVIEW OF SYSTEMS:   .10 Point review of Systems was done is negative except as noted above.   MEDICAL HISTORY:  Past Medical History:    Diagnosis Date  . BPH (benign prostatic hyperplasia)   . Hepatitis C 11/27/2017  . Hypertension   . Plasma cell leukemia ([place]) 11/23/2017  . Tobacco abuse     SURGICAL HISTORY: Past Surgical History:  Procedure Laterality Date  . APPENDECTOMY    . FEMUR IM NAIL Right 11/20/2017   Procedure: RIGHT FEMORAL INTRAMEDULLARY (IM) NAIL;  Surgeon: Nicholes Stairs, MD;  Location: [facility];  Service: Orthopedics;  Laterality: Right;    SOCIAL HISTORY: Social History   Socioeconomic History  . Marital status: Single    Spouse name: Not on file  . Number of children: Not on file  . Years of education: Not on file  . Highest education level: Not on file  Occupational History  . Not on file  Social Needs  . Financial resource strain: Not on file  . Food insecurity:    Worry: Not on file    Inability: Not on file  . Transportation needs:    Medical: Not on file    Non-medical: Not on file  Tobacco Use  . Smoking status: Former Smoker    Packs/day: 0.50    Types: Cigarettes    Last attempt to quit: 11/09/2017    Years since quitting: 0.4  . Smokeless tobacco: Never Used  . Tobacco comment: no cigarettes x 1 week.  Substance and Sexual Activity  . Alcohol use: Yes  . Drug  use: No  . Sexual activity: Not on file  Lifestyle  . Physical activity:    Days per week: Not on file    Minutes per session: Not on file  . Stress: Not on file  Relationships  . Social connections:    Talks on phone: Not on file    Gets together: Not on file    Attends religious service: Not on file    Active member of club or organization: Not on file    Attends meetings of clubs or organizations: Not on file    Relationship status: Not on file  . Intimate partner violence:    Fear of current or ex partner: Not on file    Emotionally abused: Not on file    Physically abused: Not on file    Forced sexual activity: Not on file  Other Topics Concern  . Not on file  Social History Narrative  .  Not on file    FAMILY HISTORY: Family History  Problem Relation Age of Onset  . COPD Mother   . Liver cancer Mother     ALLERGIES:  has No Known Allergies.  MEDICATIONS:  . Current Outpatient Medications on File Prior to Visit  Medication Sig Dispense Refill  . acyclovir (ZOVIRAX) 400 MG tablet Take 1 tablet (400 mg total) by mouth daily. 30 tablet 6  . cholecalciferol (VITAMIN D) 400 units TABS tablet Take 400 Units by mouth daily.    . cyclophosphamide (CYTOXAN) 50 MG capsule Take 11 caps (537m) by mouth once weekly on days 1, 8, &15, every 21 days. Take with breakfast, maintain hydration. 33 capsule 0  . dexamethasone (DECADRON) 4 MG tablet Take 5 tablets (20 mg total) by mouth once a week. Every Saturday morning 20 tablet 1  . NICOTINE STEP 2 14 MG/24HR patch PLACE 1 PATCH (14 MG TOTAL) ONTO THE SKIN DAILY. 28 patch 1  . ondansetron (ZOFRAN) 4 MG tablet Take 2 tablets (8 mg total) by mouth every 8 (eight) hours as needed for nausea. (Patient not taking: Reported on 03/18/2018) 30 tablet 0   No current facility-administered medications on file prior to visit.     PHYSICAL EXAMINATION:  VS reviewed Vitals:   04/08/18 0907  BP: 128/82  Pulse: 87  Resp: 17  Temp: 98.3 F (36.8 C)  SpO2: 100%    GENERAL:alert, in no acute distress and comfortable SKIN: no acute rashes, no significant lesions EYES: conjunctiva are pink and non-injected, sclera anicteric OROPHARYNX: MMM, no exudates, no oropharyngeal erythema or ulceration NECK: supple, no JVD LYMPH:  no palpable lymphadenopathy in the cervical, axillary or inguinal regions LUNGS: clear to auscultation b/l with normal respiratory effort HEART: regular rate & rhythm ABDOMEN:  normoactive bowel sounds , non tender, not distended. Extremity: no pedal edema PSYCH: alert & oriented x 3 with fluent speech NEURO: no focal motor/sensory deficits   LABORATORY DATA:  I have reviewed the data as listed  . CBC Latest Ref Rng &  Units 04/08/2018 04/05/2018 03/22/2018  WBC 4.0 - 10.3 K/uL 5.9 5.9 5.2  Hemoglobin 13.0 - 17.1 g/dL 12.2(L) 12.1(L) 11.8(L)  Hematocrit 38.4 - 49.9 % 36.2(L) 35.2(L) 34.3(L)  Platelets 140 - 400 K/uL 364 375 273  hgb 11.6 . CBC    Component Value Date/Time   WBC 5.9 04/08/2018 0813   WBC 5.9 04/05/2018 0843   RBC 4.01 (L) 04/08/2018 0813   RBC 4.01 (L) 04/08/2018 0813   HGB 12.2 (L) 04/08/2018 0813   HGB  8.3 (L) 12/14/2017 1230   HCT 36.2 (L) 04/08/2018 0813   HCT 23.9 (L) 12/14/2017 1230   PLT 364 04/08/2018 0813   PLT 524 (H) 12/14/2017 1230   MCV 90.3 04/08/2018 0813   MCV 88.4 12/14/2017 1230   MCH 30.4 04/08/2018 0813   MCHC 33.7 04/08/2018 0813   RDW 14.2 04/08/2018 0813   RDW 15.3 (H) 12/14/2017 1230   LYMPHSABS 1.0 04/08/2018 0813   LYMPHSABS 1.4 12/14/2017 1230   MONOABS 1.0 (H) 04/08/2018 0813   MONOABS 0.6 12/14/2017 1230   EOSABS 0.0 04/08/2018 0813   EOSABS 0.1 12/14/2017 1230   BASOSABS 0.0 04/08/2018 0813   BASOSABS 0.1 12/14/2017 1230     . CMP Latest Ref Rng & Units 04/08/2018 04/05/2018 03/22/2018  Glucose 70 - 140 mg/dL 112 101 60(L)  BUN 7 - 26 mg/dL 31(H) 30(H) 29(H)  Creatinine 0.70 - 1.30 mg/dL 1.99(H) 1.94(H) 2.16(H)  Sodium 136 - 145 mmol/L 142 143 141  Potassium 3.5 - 5.1 mmol/L 4.9 5.1 4.9  Chloride 98 - 109 mmol/L 112(H) 114(H) 108  CO2 22 - 29 mmol/L 21(L) 20(L) 24  Calcium 8.4 - 10.4 mg/dL 9.1 8.8 8.6  Total Protein 6.4 - 8.3 g/dL 7.2 7.3 6.9  Total Bilirubin 0.2 - 1.2 mg/dL 0.3 0.3 <0.2(L)  Alkaline Phos 40 - 150 U/L 78 81 86  AST 5 - 34 U/L 30 27 36(H)  ALT 0 - 55 U/L 36 36 38   Component     Latest Ref Rng & Units 11/17/2017 11/23/2017 12/01/2017 01/11/2018  Kappa free light chain     3.3 - 19.4 mg/L 27,634.1 (H) 13,734.8 (H) 6,931.2 (H) 437.3 (H)  Lamda free light chains     5.7 - 26.3 mg/L 5.8 2.3 (L) 9.8 20.9  Kappa, lamda light chain ratio     0.26 - 1.65 4,764.50 (H) 5,971.65 (H) 707.27 (H) 20.92 (H)   Component     Latest  Ref Rng & Units 02/22/2018 02/25/2018 03/15/2018 04/05/2018  Kappa free light chain     3.3 - 19.4 mg/L 182.7 (H) 132.1 (H) 130.9 (H) 121.5 (H)  Lamda free light chains     5.7 - 26.3 mg/L 12.9 12.7 12.1 12.9  Kappa, lamda light chain ratio     0.26 - 1.65 14.16 (H) 10.40 (H) 10.82 (H) 9.42 (H)           RADIOGRAPHIC STUDIES: I have personally reviewed the radiological images as listed and agreed with the findings in the report. No results found.  ASSESSMENT & PLAN:   62 y.o.A male presenting with severe hypercalcemia, skeletal findings consistent with myeloma and significant leukocytosis/lymphocytosis in the peripheral blood as well as AKI and hypercalcemia.  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 -Hypercalcemia -Renal Failure -Anemia -Extensive Bone lesions -Appears to be primarily Kappa Light chain Myeloma with no overt M spike. Appears to be responding to treatment.- with decreased kappa light chains.   BM Bx showed >95% involvement with kappa restricted serum free light chains. 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 MoL Cy-    PLAN -continue CyBorD treatment as per orders. -counseled to maintain compliance with f/u to Hep C clinic for evaluation and mx of recently noted Hep C -- this could potentially be a transplant consideration as well. -continue Acyclovir prophylaxis. -weekly labs on D1 AND D8 each cycle -continue Xgeva q4weeks -Offered a referral to [facility] to evaluate the potential for  an autologous BM transplant. The pt accepts this referral. - referral placed but has not followed up yet...notes financial concerns. Will give referral again after Hep C clinic evaluation based on their plan. -Discussed pt labwork today 04/08/18; all blood counts and chemistries are stable.  -The 04/05/18 MMP lab after C6 -The pt will be seeing hepatitis clinic on May 1 to evaluate if he is a candidate for  treatment, and will then discuss transplant with [facility].  -Pt continues to respond to treatment and we will continue to maintain current treatment plan.  -The pt has no prohibitive toxicities from continuing mx at this time.  -We will see pt back on C8D1   2) s/p Prophylactic IM nailing for rt femur. -Advised that we will re-evaluate the patient returning to work by March on the next visit following Physical Therapy at the [facility].  -patient reports improvement and is planning to return to work part time.  3) Thrombocytopenia resolved  4)Renal insufficiency -- likely primarily related to Myeloma -Kidney numbers improved. 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  7) Renal insuff- due to myeloma.  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 Some drop in hgb due to rt gluteal hematoma Plan -Hgb at 12.2 - stable   RTC with Dr Irene Limbo in 3 weeks with labs Complete C7 as per schedule.  Continue Xgeva q4weeks    . The total time spent in the appointment was 25 minutes and more than 50% was on counseling and direct patient cares.     Sullivan Lone MD [place] AAHIVMS [facility] Hematology/Oncology Physician [facility]  (Office):       [phone] (Work cell):  [phone] (Fax):           [phone]  This document serves as a record of services personally performed by Sullivan Lone, MD. It was created on his behalf by Baldwin [place], a trained medical scribe. The creation of this record is based on the scribe's personal observations and the provider's statements to them.   .I have  reviewed the above documentation for accuracy and completeness, and I agree with the above. Brunetta Genera MD MS

## 2018-04-08 ENCOUNTER — Inpatient Hospital Stay: Payer: BLUE CROSS/BLUE SHIELD

## 2018-04-08 ENCOUNTER — Encounter: Payer: Self-pay | Admitting: Hematology

## 2018-04-08 ENCOUNTER — Telehealth: Payer: Self-pay

## 2018-04-08 ENCOUNTER — Inpatient Hospital Stay (HOSPITAL_BASED_OUTPATIENT_CLINIC_OR_DEPARTMENT_OTHER): Payer: BLUE CROSS/BLUE SHIELD | Admitting: Hematology

## 2018-04-08 VITALS — BP 128/82 | HR 87 | Temp 98.3°F | Resp 17 | Ht 68.0 in | Wt 159.9 lb

## 2018-04-08 DIAGNOSIS — M899 Disorder of bone, unspecified: Secondary | ICD-10-CM | POA: Diagnosis not present

## 2018-04-08 DIAGNOSIS — C9010 Plasma cell leukemia not having achieved remission: Secondary | ICD-10-CM

## 2018-04-08 DIAGNOSIS — I10 Essential (primary) hypertension: Secondary | ICD-10-CM | POA: Diagnosis not present

## 2018-04-08 DIAGNOSIS — R42 Dizziness and giddiness: Secondary | ICD-10-CM

## 2018-04-08 DIAGNOSIS — F17210 Nicotine dependence, cigarettes, uncomplicated: Secondary | ICD-10-CM

## 2018-04-08 DIAGNOSIS — N400 Enlarged prostate without lower urinary tract symptoms: Secondary | ICD-10-CM

## 2018-04-08 DIAGNOSIS — R0602 Shortness of breath: Secondary | ICD-10-CM | POA: Diagnosis not present

## 2018-04-08 DIAGNOSIS — Z79899 Other long term (current) drug therapy: Secondary | ICD-10-CM

## 2018-04-08 DIAGNOSIS — D638 Anemia in other chronic diseases classified elsewhere: Secondary | ICD-10-CM

## 2018-04-08 DIAGNOSIS — R200 Anesthesia of skin: Secondary | ICD-10-CM

## 2018-04-08 DIAGNOSIS — C9000 Multiple myeloma not having achieved remission: Secondary | ICD-10-CM

## 2018-04-08 DIAGNOSIS — R5383 Other fatigue: Secondary | ICD-10-CM | POA: Diagnosis not present

## 2018-04-08 DIAGNOSIS — N289 Disorder of kidney and ureter, unspecified: Secondary | ICD-10-CM

## 2018-04-08 DIAGNOSIS — B1920 Unspecified viral hepatitis C without hepatic coma: Secondary | ICD-10-CM

## 2018-04-08 DIAGNOSIS — R682 Dry mouth, unspecified: Secondary | ICD-10-CM | POA: Diagnosis not present

## 2018-04-08 DIAGNOSIS — Z7189 Other specified counseling: Secondary | ICD-10-CM

## 2018-04-08 LAB — CMP (CANCER CENTER ONLY)
ALT: 36 U/L (ref 0–55)
AST: 30 U/L (ref 5–34)
Albumin: 4 g/dL (ref 3.5–5.0)
Alkaline Phosphatase: 78 U/L (ref 40–150)
Anion gap: 9 (ref 3–11)
BUN: 31 mg/dL — ABNORMAL HIGH (ref 7–26)
CO2: 21 mmol/L — ABNORMAL LOW (ref 22–29)
Calcium: 9.1 mg/dL (ref 8.4–10.4)
Chloride: 112 mmol/L — ABNORMAL HIGH (ref 98–109)
Creatinine: 1.99 mg/dL — ABNORMAL HIGH (ref 0.70–1.30)
GFR, Est AFR Am: 40 mL/min — ABNORMAL LOW (ref >60)
GFR, Estimated: 35 mL/min — ABNORMAL LOW (ref >60)
Glucose, Bld: 112 mg/dL (ref 70–140)
Potassium: 4.9 mmol/L (ref 3.5–5.1)
Sodium: 142 mmol/L (ref 136–145)
Total Bilirubin: 0.3 mg/dL (ref 0.2–1.2)
Total Protein: 7.2 g/dL (ref 6.4–8.3)

## 2018-04-08 LAB — CBC WITH DIFFERENTIAL (CANCER CENTER ONLY)
BASOS ABS: 0 10*3/uL (ref 0.0–0.1)
BASOS PCT: 1 %
EOS ABS: 0 10*3/uL (ref 0.0–0.5)
EOS PCT: 1 %
HCT: 36.2 % — ABNORMAL LOW (ref 38.4–49.9)
Hemoglobin: 12.2 g/dL — ABNORMAL LOW (ref 13.0–17.1)
LYMPHS PCT: 17 %
Lymphs Abs: 1 10*3/uL (ref 0.9–3.3)
MCH: 30.4 pg (ref 27.2–33.4)
MCHC: 33.7 g/dL (ref 32.0–36.0)
MCV: 90.3 fL (ref 79.3–98.0)
MONO ABS: 1 10*3/uL — AB (ref 0.1–0.9)
Monocytes Relative: 17 %
Neutro Abs: 3.8 10*3/uL (ref 1.5–6.5)
Neutrophils Relative %: 64 %
PLATELETS: 364 10*3/uL (ref 140–400)
RBC: 4.01 MIL/uL — AB (ref 4.20–5.82)
RDW: 14.2 % (ref 11.0–14.6)
WBC: 5.9 10*3/uL (ref 4.0–10.3)

## 2018-04-08 LAB — RETICULOCYTES
RBC.: 4.01 MIL/uL — AB (ref 4.20–5.82)
RETIC COUNT ABSOLUTE: 44.1 10*3/uL (ref 34.8–93.9)
RETIC CT PCT: 1.1 % (ref 0.8–1.8)

## 2018-04-08 LAB — KAPPA/LAMBDA LIGHT CHAINS
Kappa free light chain: 121.5 mg/L — ABNORMAL HIGH (ref 3.3–19.4)
Kappa, lambda light chain ratio: 9.42 — ABNORMAL HIGH (ref 0.26–1.65)
LAMDA FREE LIGHT CHAINS: 12.9 mg/L (ref 5.7–26.3)

## 2018-04-08 MED ORDER — ONDANSETRON HCL 8 MG PO TABS
4.0000 mg | ORAL_TABLET | Freq: Once | ORAL | Status: AC
  Administered 2018-04-08: 4 mg via ORAL

## 2018-04-08 MED ORDER — ONDANSETRON HCL 8 MG PO TABS
ORAL_TABLET | ORAL | Status: AC
  Filled 2018-04-08: qty 1

## 2018-04-08 MED ORDER — BORTEZOMIB CHEMO SQ INJECTION 3.5 MG (2.5MG/ML)
1.3000 mg/m2 | Freq: Once | INTRAMUSCULAR | Status: AC
  Administered 2018-04-08: 2.25 mg via SUBCUTANEOUS
  Filled 2018-04-08: qty 2.25

## 2018-04-08 NOTE — Progress Notes (Signed)
Dr. Irene Limbo okay to tx with Ctn 1.99.

## 2018-04-08 NOTE — Patient Instructions (Signed)
[facility] Discharge Instructions for Patients Receiving Chemotherapy  Today you received the following chemotherapy agents Velcade To help prevent nausea and vomiting after your treatment, we encourage you to take your nausea medication as prescribed.   If you develop nausea and vomiting that is not controlled by your nausea medication, call the clinic.   BELOW ARE SYMPTOMS THAT SHOULD BE REPORTED IMMEDIATELY:  *FEVER GREATER THAN 100.5 F  *CHILLS WITH OR WITHOUT FEVER  NAUSEA AND VOMITING THAT IS NOT CONTROLLED WITH YOUR NAUSEA MEDICATION  *UNUSUAL SHORTNESS OF BREATH  *UNUSUAL BRUISING OR BLEEDING  TENDERNESS IN MOUTH AND THROAT WITH OR WITHOUT PRESENCE OF ULCERS  *URINARY PROBLEMS  *BOWEL PROBLEMS  UNUSUAL RASH Items with * indicate a potential emergency and should be followed up as soon as possible.  Feel free to call the clinic should you have any questions or concerns. The clinic phone number is [phone].  Please show the CHEMO ALERT CARD at check-in to the Emergency Department and triage nurse.   

## 2018-04-08 NOTE — Telephone Encounter (Signed)
Currently working on 4/29 los completion. Waiting on o/v approval to add on 5/20. Per 4/29 los

## 2018-04-09 LAB — MULTIPLE MYELOMA PANEL, SERUM
Albumin SerPl Elph-Mcnc: 4 g/dL (ref 2.9–4.4)
Albumin/Glob SerPl: 1.5 (ref 0.7–1.7)
Alpha 1: 0.2 g/dL (ref 0.0–0.4)
Alpha2 Glob SerPl Elph-Mcnc: 1 g/dL (ref 0.4–1.0)
B-Globulin SerPl Elph-Mcnc: 0.9 g/dL (ref 0.7–1.3)
Gamma Glob SerPl Elph-Mcnc: 0.5 g/dL (ref 0.4–1.8)
Globulin, Total: 2.7 g/dL (ref 2.2–3.9)
IGA: 18 mg/dL — AB (ref 90–386)
IGM (IMMUNOGLOBULIN M), SRM: 14 mg/dL — AB (ref 20–172)
IgG (Immunoglobin G), Serum: 717 mg/dL (ref 700–1600)
TOTAL PROTEIN ELP: 6.7 g/dL (ref 6.0–8.5)

## 2018-04-11 ENCOUNTER — Encounter: Payer: Self-pay | Admitting: Internal Medicine

## 2018-04-11 ENCOUNTER — Ambulatory Visit (INDEPENDENT_AMBULATORY_CARE_PROVIDER_SITE_OTHER): Payer: BLUE CROSS/BLUE SHIELD | Admitting: Internal Medicine

## 2018-04-11 VITALS — BP 137/81 | HR 88 | Temp 97.4°F | Ht 68.0 in | Wt 158.0 lb

## 2018-04-11 DIAGNOSIS — B182 Chronic viral hepatitis C: Secondary | ICD-10-CM | POA: Diagnosis not present

## 2018-04-11 LAB — CBC WITH DIFFERENTIAL/PLATELET
BASOS PCT: 1.1 %
Basophils Absolute: 41 cells/uL (ref 0–200)
EOS PCT: 2.7 %
Eosinophils Absolute: 100 cells/uL (ref 15–500)
HCT: 33.4 % — ABNORMAL LOW (ref 38.5–50.0)
HEMOGLOBIN: 11.5 g/dL — AB (ref 13.2–17.1)
Lymphs Abs: 981 cells/uL (ref 850–3900)
MCH: 30 pg (ref 27.0–33.0)
MCHC: 34.4 g/dL (ref 32.0–36.0)
MCV: 87.2 fL (ref 80.0–100.0)
MONOS PCT: 23.8 %
MPV: 10 fL (ref 7.5–12.5)
Neutro Abs: 1698 cells/uL (ref 1500–7800)
Neutrophils Relative %: 45.9 %
Platelets: 246 10*3/uL (ref 140–400)
RBC: 3.83 10*6/uL — AB (ref 4.20–5.80)
RDW: 14.1 % (ref 11.0–15.0)
Total Lymphocyte: 26.5 %
WBC mixed population: 881 cells/uL (ref 200–950)
WBC: 3.7 10*3/uL — ABNORMAL LOW (ref 3.8–10.8)

## 2018-04-11 LAB — COMPLETE METABOLIC PANEL WITH GFR
AG RATIO: 2 (calc) (ref 1.0–2.5)
ALKALINE PHOSPHATASE (APISO): 69 U/L (ref 40–115)
ALT: 42 U/L (ref 9–46)
AST: 40 U/L — ABNORMAL HIGH (ref 10–35)
Albumin: 4.2 g/dL (ref 3.6–5.1)
BILIRUBIN TOTAL: 0.3 mg/dL (ref 0.2–1.2)
BUN/Creatinine Ratio: 14 (calc) (ref 6–22)
BUN: 27 mg/dL — AB (ref 7–25)
CHLORIDE: 110 mmol/L (ref 98–110)
CO2: 24 mmol/L (ref 20–32)
Calcium: 8.6 mg/dL (ref 8.6–10.3)
Creat: 1.89 mg/dL — ABNORMAL HIGH (ref 0.70–1.25)
GFR, Est African American: 44 mL/min/{1.73_m2} — ABNORMAL LOW (ref >=60)
GFR, Est Non African American: 38 mL/min/{1.73_m2} — ABNORMAL LOW (ref >=60)
GLUCOSE: 123 mg/dL — AB (ref 65–99)
Globulin: 2.1 g/dL (calc) (ref 1.9–3.7)
POTASSIUM: 5 mmol/L (ref 3.5–5.3)
SODIUM: 142 mmol/L (ref 135–146)
Total Protein: 6.3 g/dL (ref 6.1–8.1)

## 2018-04-11 NOTE — Progress Notes (Signed)
Patient ID: Andres Fernandez, male   DOB: 27-Sep-1956, 62 y.o.   MRN: [id number]     [facility]   CC: consideration for treatment for chronic hepatitis C  HPI:  +Andres Fernandez is a 62 y.o. male who presents for initial evaluation and management of chronic hepatitis C.  Patient tested positive during his recent hospitalization in December 2018. Hepatitis C-associated risk factors present are: none. Patient denies IV drug abuse. Patient has had other studies performed. Results: hepatitis C RNA by PCR, result: positive. Patient has not had prior treatment for Hepatitis C. Patient does not have a past history of liver disease. Patient does not have a family history of liver disease. Patient does not  have associated signs or symptoms related to liver disease.  Labs reviewed and confirm chronic hepatitis C with a positive viral load.  Normal platelets and recent ultrasound without any masses, no cirrhosis.  Also recently diagnosed with Multiple Myeloma.      Patient does not have documented immunity to Hepatitis A. Patient does not have documented immunity to Hepatitis B.    Review of Systems:  Constitutional: negative for fatigue and malaise Gastrointestinal: negative for diarrhea Integument/breast: negative for rash Musculoskeletal: negative for myalgias and arthralgias All other systems reviewed and are negative       Past Medical History:  Diagnosis Date  . BPH (benign prostatic hyperplasia)   . Hepatitis C 11/27/2017  . Hypertension   . Plasma cell leukemia ([place]) 11/23/2017  . Tobacco abuse     Prior to Admission medications   Medication Sig Start Date End Date Taking? Authorizing Provider  acyclovir (ZOVIRAX) 400 MG tablet Take 1 tablet (400 mg total) by mouth daily. 12/14/17  Yes Brunetta Genera, MD  cholecalciferol (VITAMIN D) 400 units TABS tablet Take 400 Units by mouth daily.   Yes [provider]  cyclophosphamide (CYTOXAN) 50 MG  capsule Take 11 caps (535m) by mouth once weekly on days 1, 8, &15, every 21 days. Take with breakfast, maintain hydration. 04/02/18  Yes Perlov, MMarinell Blight MD  dexamethasone (DECADRON) 4 MG tablet Take 5 tablets (20 mg total) by mouth once a week. Every Saturday morning 02/25/18  Yes KBrunetta Genera MD  NICOTINE STEP 2 14 MG/24HR patch PLACE 1 PATCH (14 MG TOTAL) ONTO THE SKIN DAILY. 02/15/18  Yes KBrunetta Genera MD  ondansetron (ZOFRAN) 4 MG tablet Take 2 tablets (8 mg total) by mouth every 8 (eight) hours as needed for nausea. 12/14/17  Yes KBrunetta Genera MD    No Known Allergies  Social History   Tobacco Use  . Smoking status: Current Every Day Smoker    Packs/day: 0.25    Types: Cigarettes    Last attempt to quit: 11/09/2017    Years since quitting: 0.4  . Smokeless tobacco: Never Used  Substance Use Topics  . Alcohol use: Yes  . Drug use: No    Family History  Problem Relation Age of Onset  . COPD Mother   . Liver cancer Mother      Objective:  Constitutional: in no apparent distress,  Vitals:   04/11/18 1017  BP: 137/81  Pulse: 88  Temp: (!) 97.4 F (36.3 C)   Eyes: anicteric Cardiovascular: Cor RRR Respiratory: CTA B normal respiratory effort Gastrointestinal: Bowel sounds are normal some soreness at the site of injection Musculoskeletal: no pedal edema noted Skin: negatives: no rash; no porphyria cutanea tarda Lymphatic: no cervical lymphadenopathy  Laboratory Genotype: No results found for: HCVGENOTYPE HCV viral load: No results found for: HCVQUANT Lab Results  Component Value Date   WBC 5.9 04/08/2018   HGB 12.2 (L) 04/08/2018   HCT 36.2 (L) 04/08/2018   MCV 90.3 04/08/2018   PLT 364 04/08/2018    Lab Results  Component Value Date   CREATININE 1.99 (H) 04/08/2018   BUN 31 (H) 04/08/2018   NA 142 04/08/2018   K 4.9 04/08/2018   CL 112 (H) 04/08/2018   CO2 21 (L) 04/08/2018    Lab Results  Component Value Date   ALT 36  04/08/2018   AST 30 04/08/2018   ALKPHOS 78 04/08/2018     Labs and history reviewed and show CHILD-PUGH unknown  5-6 points: Child class A 7-9 points: Child class B 10-15 points: Child class C  Lab Results  Component Value Date   INR 0.97 12/01/2017   BILITOT 0.3 04/08/2018   ALBUMIN 4.0 04/08/2018     Assessment: New Patient with Chronic Hepatitis C genotype 1b, untreated.  I discussed with the patient the lab findings that confirm chronic hepatitis C as well as the natural history and progression of disease including about 30% of people who develop cirrhosis of the liver if left untreated and once cirrhosis is established there is a 2-7% risk per year of liver cancer and liver failure.  I discussed the importance of treatment and benefits in reducing the risk, even if significant liver fibrosis exists.   Plan: 1) Patient counseled extensively on limiting acetaminophen to no more than 2 grams daily, avoidance of alcohol. 2) Transmission discussed with patient including sexual transmission, sharing razors and toothbrush.   3) Will need referral to gastroenterology if concern for cirrhosis 4) Will need referral for substance abuse counseling: No.; Further work up to include urine drug screen  No. 5) Will prescribe Zepatier for 12 weeks after the elastography  6) Hepatitis A and B titers. 8) Pneumovax vaccine next visit 9) Further work up to include liver staging with elastography 10) will follow up after starting medication with PharmD but will wait until the elastography is done to start.

## 2018-04-11 NOTE — Patient Instructions (Signed)
Date 04/11/18  Dear Andres Fernandez, As discussed in the [facility], your hepatitis C therapy will include the following medications:          Zepatier (elbasvir 50 mg/grazoprevir 100 mg) for 12 weeks              OR      16 weeks with ribavirin in certain cases   Please note that ALL MEDICATIONS WILL START ON THE SAME DATE for a total of 12 weeks. ---------------------------------------------------------------- Your HCV Treatment Start Date: TBA   Your HCV genotype:  1b    Liver Fibrosis: TBD    ---------------------------------------------------------------- YOUR PHARMACY CONTACT:   [facility] [address] [place], [place] [address] Phone: [phone] Hours: Monday to Friday 7:30 am to 6:00 pm   Please always contact your pharmacy at least 3-4 business days before you run out of medications to ensure your next month's medication is ready or 1 week prior to running out if you receive it by mail.  Remember, each prescription is for 28 days. ---------------------------------------------------------------- GENERAL NOTES REGARDING YOUR HEPATITIS C MEDICATION:  ZEPATIER is available as a beige-colored, oval-shaped, film-coated tablet debossed with "770" on one side and plain on the other. Each tablet contains 50 mg elbasvir and 100 mg grazoprevir.   Common side effects of ZEPATIER when used without ribavirin include: - feeling tired -trouble sleeping - headache -diarrhea - nausea  Common side effects of ZEPATIER when used with ribavirin include: - low red blood cell counts (anemia) - feeling irritable - headache - stomach pain - feeling tired - depression - shortness of breath - joint pain - rash or itching   Please note that this only lists the most common side effects and is NOT a comprehensive list of the potential side effects of these medications. For more information, please review the drug information sheets that come with your medication package from  the pharmacy.  ---------------------------------------------------------------- GENERAL HELPFUL HINTS ON HCV THERAPY: 1. Stay well-hydrated. 2. Notify the ID Clinic of any changes in your other over-the-counter/herbal or prescription medications. 3. If you miss a dose of your medication, take the missed dose as soon as you remember. Return to your regular time/dose schedule the next day.  4.  Do not stop taking your medications without first talking with your healthcare provider. 5.  You may take Tylenol (acetaminophen), as long as the dose is less than 2000 mg (OR no more than 4 tablets of the Tylenol Extra Strengths 500mg  tablet) in 24 hours. 6.  You will see our pharmacist-specialist within the first 2 weeks of starting your medication. 7.  You will be scheduled for labs once during treatment, soon after treatment completion and 6 months or more after treatment completion to verify the virus is out of your system.   8.  If ribavirin is part of your regimen, you may have a lab visit every 2 weeks.   Thayer Headings, [place] for Infectious Diseases [facility] [place], [place]  [id number] [phone]

## 2018-04-12 ENCOUNTER — Inpatient Hospital Stay: Payer: BLUE CROSS/BLUE SHIELD

## 2018-04-12 ENCOUNTER — Inpatient Hospital Stay: Payer: BLUE CROSS/BLUE SHIELD | Attending: Hematology

## 2018-04-12 VITALS — BP 135/95 | HR 87 | Temp 98.1°F | Resp 16

## 2018-04-12 DIAGNOSIS — Z5111 Encounter for antineoplastic chemotherapy: Secondary | ICD-10-CM | POA: Insufficient documentation

## 2018-04-12 DIAGNOSIS — F1011 Alcohol abuse, in remission: Secondary | ICD-10-CM | POA: Insufficient documentation

## 2018-04-12 DIAGNOSIS — C9010 Plasma cell leukemia not having achieved remission: Secondary | ICD-10-CM | POA: Diagnosis present

## 2018-04-12 DIAGNOSIS — I10 Essential (primary) hypertension: Secondary | ICD-10-CM | POA: Insufficient documentation

## 2018-04-12 DIAGNOSIS — N400 Enlarged prostate without lower urinary tract symptoms: Secondary | ICD-10-CM | POA: Diagnosis not present

## 2018-04-12 DIAGNOSIS — F17210 Nicotine dependence, cigarettes, uncomplicated: Secondary | ICD-10-CM | POA: Diagnosis not present

## 2018-04-12 DIAGNOSIS — D649 Anemia, unspecified: Secondary | ICD-10-CM | POA: Insufficient documentation

## 2018-04-12 DIAGNOSIS — M84551A Pathological fracture in neoplastic disease, right femur, initial encounter for fracture: Secondary | ICD-10-CM

## 2018-04-12 DIAGNOSIS — C9000 Multiple myeloma not having achieved remission: Secondary | ICD-10-CM

## 2018-04-12 DIAGNOSIS — B1920 Unspecified viral hepatitis C without hepatic coma: Secondary | ICD-10-CM | POA: Insufficient documentation

## 2018-04-12 DIAGNOSIS — Z720 Tobacco use: Secondary | ICD-10-CM | POA: Diagnosis not present

## 2018-04-12 DIAGNOSIS — Z7189 Other specified counseling: Secondary | ICD-10-CM

## 2018-04-12 LAB — CBC WITH DIFFERENTIAL (CANCER CENTER ONLY)
Basophils Absolute: 0 10*3/uL (ref 0.0–0.1)
Basophils Relative: 1 %
Eosinophils Absolute: 0.3 10*3/uL (ref 0.0–0.5)
Eosinophils Relative: 6 %
HCT: 33.2 % — ABNORMAL LOW (ref 38.4–49.9)
Hemoglobin: 11.6 g/dL — ABNORMAL LOW (ref 13.0–17.1)
LYMPHS ABS: 1 10*3/uL (ref 0.9–3.3)
Lymphocytes Relative: 23 %
MCH: 30.9 pg (ref 27.2–33.4)
MCHC: 34.9 g/dL (ref 32.0–36.0)
MCV: 88.3 fL (ref 79.3–98.0)
Monocytes Absolute: 1.3 10*3/uL — ABNORMAL HIGH (ref 0.1–0.9)
Monocytes Relative: 28 %
Neutro Abs: 1.9 10*3/uL (ref 1.5–6.5)
Neutrophils Relative %: 42 %
Platelet Count: 244 10*3/uL (ref 140–400)
RBC: 3.76 MIL/uL — AB (ref 4.20–5.82)
RDW: 14.1 % (ref 11.0–14.6)
WBC: 4.5 10*3/uL (ref 4.0–10.3)

## 2018-04-12 LAB — PROTIME-INR
INR: 1
PROTHROMBIN TIME: 10.5 s (ref 9.0–11.5)

## 2018-04-12 LAB — RETICULOCYTES
RBC.: 3.76 MIL/uL — ABNORMAL LOW (ref 4.20–5.82)
RETIC CT PCT: 0.9 % (ref 0.8–1.8)
Retic Count, Absolute: 33.8 10*3/uL — ABNORMAL LOW (ref 34.8–93.9)

## 2018-04-12 LAB — CMP (CANCER CENTER ONLY)
ALK PHOS: 78 U/L (ref 40–150)
ALT: 44 U/L (ref 0–55)
AST: 40 U/L — AB (ref 5–34)
Albumin: 3.7 g/dL (ref 3.5–5.0)
Anion gap: 6 (ref 3–11)
BUN: 28 mg/dL — AB (ref 7–26)
CHLORIDE: 112 mmol/L — AB (ref 98–109)
CO2: 23 mmol/L (ref 22–29)
CREATININE: 1.85 mg/dL — AB (ref 0.70–1.30)
Calcium: 9.5 mg/dL (ref 8.4–10.4)
GFR, EST NON AFRICAN AMERICAN: 38 mL/min — AB (ref >60)
GFR, Est AFR Am: 44 mL/min — ABNORMAL LOW (ref >60)
Glucose, Bld: 96 mg/dL (ref 70–140)
Potassium: 5.2 mmol/L — ABNORMAL HIGH (ref 3.5–5.1)
Sodium: 141 mmol/L (ref 136–145)
Total Bilirubin: <0.2 mg/dL — ABNORMAL LOW (ref 0.2–1.2)
Total Protein: 6.8 g/dL (ref 6.4–8.3)

## 2018-04-12 LAB — HIV ANTIBODY (ROUTINE TESTING W REFLEX): HIV 1&2 Ab, 4th Generation: NONREACTIVE

## 2018-04-12 LAB — HEPATITIS B SURFACE ANTIBODY,QUALITATIVE: Hep B S Ab: NONREACTIVE

## 2018-04-12 LAB — HEPATITIS A ANTIBODY, TOTAL: Hepatitis A AB,Total: REACTIVE — AB

## 2018-04-12 MED ORDER — BORTEZOMIB CHEMO SQ INJECTION 3.5 MG (2.5MG/ML)
1.3000 mg/m2 | Freq: Once | INTRAMUSCULAR | Status: AC
  Administered 2018-04-12: 2.25 mg via SUBCUTANEOUS
  Filled 2018-04-12: qty 2.25

## 2018-04-12 MED ORDER — DENOSUMAB 120 MG/1.7ML ~~LOC~~ SOLN
120.0000 mg | Freq: Once | SUBCUTANEOUS | Status: AC
  Administered 2018-04-12: 120 mg via SUBCUTANEOUS

## 2018-04-12 MED ORDER — ONDANSETRON HCL 8 MG PO TABS
4.0000 mg | ORAL_TABLET | Freq: Once | ORAL | Status: AC
  Administered 2018-04-12: 4 mg via ORAL

## 2018-04-12 MED ORDER — ONDANSETRON HCL 8 MG PO TABS
ORAL_TABLET | ORAL | Status: AC
  Filled 2018-04-12: qty 1

## 2018-04-12 MED ORDER — DENOSUMAB 120 MG/1.7ML ~~LOC~~ SOLN
SUBCUTANEOUS | Status: AC
  Filled 2018-04-12: qty 1.7

## 2018-04-12 NOTE — Progress Notes (Signed)
Ok to treat with Cr of 1.85 per Dr. Irene Limbo

## 2018-04-12 NOTE — Patient Instructions (Addendum)
[facility] Discharge Instructions for Patients Receiving Chemotherapy  Today you received the following chemotherapy agents:  Velcade   To help prevent nausea and vomiting after your treatment, we encourage you to take your nausea medication as prescribed.   If you develop nausea and vomiting that is not controlled by your nausea medication, call the clinic.   BELOW ARE SYMPTOMS THAT SHOULD BE REPORTED IMMEDIATELY:  *FEVER GREATER THAN 100.5 F  *CHILLS WITH OR WITHOUT FEVER  NAUSEA AND VOMITING THAT IS NOT CONTROLLED WITH YOUR NAUSEA MEDICATION  *UNUSUAL SHORTNESS OF BREATH  *UNUSUAL BRUISING OR BLEEDING  TENDERNESS IN MOUTH AND THROAT WITH OR WITHOUT PRESENCE OF ULCERS  *URINARY PROBLEMS  *BOWEL PROBLEMS  UNUSUAL RASH Items with * indicate a potential emergency and should be followed up as soon as possible.  Feel free to call the clinic should you have any questions or concerns. The clinic phone number is [phone].  Please show the [facility] at check-in to the Emergency Department and triage nurse.  Denosumab injection What is this medicine? DENOSUMAB (den oh sue mab) slows bone breakdown. Prolia is used to treat osteoporosis in women after menopause and in men. Delton See is used to treat a high calcium level due to cancer and to prevent bone fractures and other bone problems caused by multiple myeloma or cancer bone metastases. Delton See is also used to treat giant cell tumor of the bone. This medicine may be used for other purposes; ask your health care provider or pharmacist if you have questions. COMMON BRAND NAME(S): Prolia, XGEVA What should I tell my health care provider before I take this medicine? They need to know if you have any of these conditions: -dental disease -having surgery or tooth extraction -infection -kidney disease -low levels of calcium or Vitamin D in the blood -malnutrition -on hemodialysis -skin conditions or  sensitivity -thyroid or parathyroid disease -an unusual reaction to denosumab, other medicines, foods, dyes, or preservatives -pregnant or trying to get pregnant -breast-feeding How should I use this medicine? This medicine is for injection under the skin. It is given by a health care professional in a hospital or clinic setting. If you are getting Prolia, a special MedGuide will be given to you by the pharmacist with each prescription and refill. Be sure to read this information carefully each time. For Prolia, talk to your pediatrician regarding the use of this medicine in children. Special care may be needed. For Delton See, talk to your pediatrician regarding the use of this medicine in children. While this drug may be prescribed for children as young as 13 years for selected conditions, precautions do apply. Overdosage: If you think you have taken too much of this medicine contact a poison control center or emergency room at once. NOTE: This medicine is only for you. Do not share this medicine with others. What if I miss a dose? It is important not to miss your dose. Call your doctor or health care professional if you are unable to keep an appointment. What may interact with this medicine? Do not take this medicine with any of the following medications: -other medicines containing denosumab This medicine may also interact with the following medications: -medicines that lower your chance of fighting infection -steroid medicines like prednisone or cortisone This list may not describe all possible interactions. Give your health care provider a list of all the medicines, herbs, non-prescription drugs, or dietary supplements you use. Also tell them if you smoke, drink alcohol, or  use illegal drugs. Some items may interact with your medicine. What should I watch for while using this medicine? Visit your doctor or health care professional for regular checks on your progress. Your doctor or health care  professional may order blood tests and other tests to see how you are doing. Call your doctor or health care professional for advice if you get a fever, chills or sore throat, or other symptoms of a cold or flu. Do not treat yourself. This drug may decrease your body's ability to fight infection. Try to avoid being around people who are sick. You should make sure you get enough calcium and vitamin D while you are taking this medicine, unless your doctor tells you not to. Discuss the foods you eat and the vitamins you take with your health care professional. See your dentist regularly. Brush and floss your teeth as directed. Before you have any dental work done, tell your dentist you are receiving this medicine. Do not become pregnant while taking this medicine or for 5 months after stopping it. Talk with your doctor or health care professional about your birth control options while taking this medicine. Women should inform their doctor if they wish to become pregnant or think they might be pregnant. There is a potential for serious side effects to an unborn child. Talk to your health care professional or pharmacist for more information. What side effects may I notice from receiving this medicine? Side effects that you should report to your doctor or health care professional as soon as possible: -allergic reactions like skin rash, itching or hives, swelling of the face, lips, or tongue -bone pain -breathing problems -dizziness -jaw pain, especially after dental work -redness, blistering, peeling of the skin -signs and symptoms of infection like fever or chills; cough; sore throat; pain or trouble passing urine -signs of low calcium like fast heartbeat, muscle cramps or muscle pain; pain, tingling, numbness in the hands or feet; seizures -unusual bleeding or bruising -unusually weak or tired Side effects that usually do not require medical attention (report to your doctor or health care professional  if they continue or are bothersome): -constipation -diarrhea -headache -joint pain -loss of appetite -muscle pain -runny nose -tiredness -upset stomach This list may not describe all possible side effects. Call your doctor for medical advice about side effects. You may report side effects to FDA at 1-800-FDA-1088. Where should I keep my medicine? This medicine is only given in a clinic, doctor's office, or other health care setting and will not be stored at home. NOTE: This sheet is a summary. It may not cover all possible information. If you have questions about this medicine, talk to your doctor, pharmacist, or health care provider.  2018 [employer]vier/Gold Standard (2016-12-19 19:17:21)

## 2018-04-14 LAB — LIVER FIBROSIS, FIBROTEST-ACTITEST
ALT: 41 U/L (ref 9–46)
Alpha-2-Macroglobulin: 490 mg/dL — ABNORMAL HIGH (ref 106–279)
Apolipoprotein A1: 163 mg/dL (ref 94–176)
Bilirubin: 0.2 mg/dL (ref 0.2–1.2)
Fibrosis Score: 0.43
GGT: 46 U/L (ref 3–70)
Haptoglobin: 246 mg/dL — ABNORMAL HIGH (ref 43–212)
NECROINFLAMMAT ACT SCORE: 0.26
Reference ID: 2451265

## 2018-04-15 ENCOUNTER — Inpatient Hospital Stay: Payer: BLUE CROSS/BLUE SHIELD

## 2018-04-15 VITALS — BP 129/90 | HR 80 | Temp 98.2°F | Resp 17

## 2018-04-15 DIAGNOSIS — Z7189 Other specified counseling: Secondary | ICD-10-CM

## 2018-04-15 DIAGNOSIS — C9010 Plasma cell leukemia not having achieved remission: Secondary | ICD-10-CM

## 2018-04-15 DIAGNOSIS — Z5111 Encounter for antineoplastic chemotherapy: Secondary | ICD-10-CM | POA: Diagnosis not present

## 2018-04-15 DIAGNOSIS — C9000 Multiple myeloma not having achieved remission: Secondary | ICD-10-CM

## 2018-04-15 LAB — CBC WITH DIFFERENTIAL (CANCER CENTER ONLY)
Basophils Absolute: 0 10*3/uL (ref 0.0–0.1)
Basophils Relative: 0 %
EOS ABS: 0 10*3/uL (ref 0.0–0.5)
Eosinophils Relative: 1 %
HCT: 34.3 % — ABNORMAL LOW (ref 38.4–49.9)
HEMOGLOBIN: 11.7 g/dL — AB (ref 13.0–17.1)
LYMPHS ABS: 1.1 10*3/uL (ref 0.9–3.3)
LYMPHS PCT: 21 %
MCH: 30.5 pg (ref 27.2–33.4)
MCHC: 34.1 g/dL (ref 32.0–36.0)
MCV: 89.6 fL (ref 79.3–98.0)
Monocytes Absolute: 1 10*3/uL — ABNORMAL HIGH (ref 0.1–0.9)
Monocytes Relative: 18 %
NEUTROS PCT: 60 %
Neutro Abs: 3.2 10*3/uL (ref 1.5–6.5)
Platelet Count: 225 10*3/uL (ref 140–400)
RBC: 3.83 MIL/uL — AB (ref 4.20–5.82)
RDW: 14.2 % (ref 11.0–14.6)
WBC: 5.3 10*3/uL (ref 4.0–10.3)

## 2018-04-15 LAB — CMP (CANCER CENTER ONLY)
ALT: 58 U/L — ABNORMAL HIGH (ref 0–55)
ANION GAP: 7 (ref 3–11)
AST: 54 U/L — AB (ref 5–34)
Albumin: 3.9 g/dL (ref 3.5–5.0)
Alkaline Phosphatase: 76 U/L (ref 40–150)
BUN: 33 mg/dL — ABNORMAL HIGH (ref 7–26)
CHLORIDE: 111 mmol/L — AB (ref 98–109)
CO2: 23 mmol/L (ref 22–29)
Calcium: 8.7 mg/dL (ref 8.4–10.4)
Creatinine: 2.03 mg/dL — ABNORMAL HIGH (ref 0.70–1.30)
GFR, EST AFRICAN AMERICAN: 39 mL/min — AB (ref >60)
GFR, EST NON AFRICAN AMERICAN: 34 mL/min — AB (ref >60)
Glucose, Bld: 82 mg/dL (ref 70–140)
Potassium: 4.6 mmol/L (ref 3.5–5.1)
Sodium: 141 mmol/L (ref 136–145)
Total Bilirubin: <0.2 mg/dL — ABNORMAL LOW (ref 0.2–1.2)
Total Protein: 6.9 g/dL (ref 6.4–8.3)

## 2018-04-15 LAB — RETICULOCYTES
RBC.: 3.83 MIL/uL — ABNORMAL LOW (ref 4.20–5.82)
RETIC CT PCT: 0.7 % — AB (ref 0.8–1.8)
Retic Count, Absolute: 26.8 10*3/uL — ABNORMAL LOW (ref 34.8–93.9)

## 2018-04-15 MED ORDER — ONDANSETRON HCL 8 MG PO TABS
4.0000 mg | ORAL_TABLET | Freq: Once | ORAL | Status: AC
  Administered 2018-04-15: 4 mg via ORAL

## 2018-04-15 MED ORDER — ONDANSETRON HCL 8 MG PO TABS
ORAL_TABLET | ORAL | Status: AC
  Filled 2018-04-15: qty 1

## 2018-04-15 MED ORDER — PROCHLORPERAZINE MALEATE 10 MG PO TABS
ORAL_TABLET | ORAL | Status: AC
  Filled 2018-04-15: qty 1

## 2018-04-15 MED ORDER — BORTEZOMIB CHEMO SQ INJECTION 3.5 MG (2.5MG/ML)
1.3000 mg/m2 | Freq: Once | INTRAMUSCULAR | Status: AC
  Administered 2018-04-15: 2.25 mg via SUBCUTANEOUS
  Filled 2018-04-15: qty 2.25

## 2018-04-15 MED ORDER — PROCHLORPERAZINE MALEATE 10 MG PO TABS: 10.0000 mg | ORAL_TABLET | Freq: Once | ORAL | Status: DC

## 2018-04-15 MED ORDER — TRAZODONE HCL 50 MG PO TABS: 50.0000 mg | ORAL_TABLET | Freq: Every day | ORAL | 0 refills | Status: DC

## 2018-04-15 NOTE — Progress Notes (Signed)
Ok to treat with SCr 2.03 per Dr. Irene Limbo

## 2018-04-15 NOTE — Patient Instructions (Signed)
[facility] Discharge Instructions for Patients Receiving Chemotherapy  Today you received the following chemotherapy agents:  Velcade   To help prevent nausea and vomiting after your treatment, we encourage you to take your nausea medication as prescribed.   If you develop nausea and vomiting that is not controlled by your nausea medication, call the clinic.   BELOW ARE SYMPTOMS THAT SHOULD BE REPORTED IMMEDIATELY:  *FEVER GREATER THAN 100.5 F  *CHILLS WITH OR WITHOUT FEVER  NAUSEA AND VOMITING THAT IS NOT CONTROLLED WITH YOUR NAUSEA MEDICATION  *UNUSUAL SHORTNESS OF BREATH  *UNUSUAL BRUISING OR BLEEDING  TENDERNESS IN MOUTH AND THROAT WITH OR WITHOUT PRESENCE OF ULCERS  *URINARY PROBLEMS  *BOWEL PROBLEMS  UNUSUAL RASH Items with * indicate a potential emergency and should be followed up as soon as possible.  Feel free to call the clinic should you have any questions or concerns. The clinic phone number is [phone].  Please show the [facility] at check-in to the Emergency Department and triage nurse.  Denosumab injection What is this medicine? DENOSUMAB (den oh sue mab) slows bone breakdown. Prolia is used to treat osteoporosis in women after menopause and in men. Delton See is used to treat a high calcium level due to cancer and to prevent bone fractures and other bone problems caused by multiple myeloma or cancer bone metastases. Delton See is also used to treat giant cell tumor of the bone. This medicine may be used for other purposes; ask your health care provider or pharmacist if you have questions. COMMON BRAND NAME(S): Prolia, XGEVA What should I tell my health care provider before I take this medicine? They need to know if you have any of these conditions: -dental disease -having surgery or tooth extraction -infection -kidney disease -low levels of calcium or Vitamin D in the blood -malnutrition -on hemodialysis -skin conditions or  sensitivity -thyroid or parathyroid disease -an unusual reaction to denosumab, other medicines, foods, dyes, or preservatives -pregnant or trying to get pregnant -breast-feeding How should I use this medicine? This medicine is for injection under the skin. It is given by a health care professional in a hospital or clinic setting. If you are getting Prolia, a special MedGuide will be given to you by the pharmacist with each prescription and refill. Be sure to read this information carefully each time. For Prolia, talk to your pediatrician regarding the use of this medicine in children. Special care may be needed. For Delton See, talk to your pediatrician regarding the use of this medicine in children. While this drug may be prescribed for children as young as 13 years for selected conditions, precautions do apply. Overdosage: If you think you have taken too much of this medicine contact a poison control center or emergency room at once. NOTE: This medicine is only for you. Do not share this medicine with others. What if I miss a dose? It is important not to miss your dose. Call your doctor or health care professional if you are unable to keep an appointment. What may interact with this medicine? Do not take this medicine with any of the following medications: -other medicines containing denosumab This medicine may also interact with the following medications: -medicines that lower your chance of fighting infection -steroid medicines like prednisone or cortisone This list may not describe all possible interactions. Give your health care provider a list of all the medicines, herbs, non-prescription drugs, or dietary supplements you use. Also tell them if you smoke, drink alcohol, or  use illegal drugs. Some items may interact with your medicine. What should I watch for while using this medicine? Visit your doctor or health care professional for regular checks on your progress. Your doctor or health care  professional may order blood tests and other tests to see how you are doing. Call your doctor or health care professional for advice if you get a fever, chills or sore throat, or other symptoms of a cold or flu. Do not treat yourself. This drug may decrease your body's ability to fight infection. Try to avoid being around people who are sick. You should make sure you get enough calcium and vitamin D while you are taking this medicine, unless your doctor tells you not to. Discuss the foods you eat and the vitamins you take with your health care professional. See your dentist regularly. Brush and floss your teeth as directed. Before you have any dental work done, tell your dentist you are receiving this medicine. Do not become pregnant while taking this medicine or for 5 months after stopping it. Talk with your doctor or health care professional about your birth control options while taking this medicine. Women should inform their doctor if they wish to become pregnant or think they might be pregnant. There is a potential for serious side effects to an unborn child. Talk to your health care professional or pharmacist for more information. What side effects may I notice from receiving this medicine? Side effects that you should report to your doctor or health care professional as soon as possible: -allergic reactions like skin rash, itching or hives, swelling of the face, lips, or tongue -bone pain -breathing problems -dizziness -jaw pain, especially after dental work -redness, blistering, peeling of the skin -signs and symptoms of infection like fever or chills; cough; sore throat; pain or trouble passing urine -signs of low calcium like fast heartbeat, muscle cramps or muscle pain; pain, tingling, numbness in the hands or feet; seizures -unusual bleeding or bruising -unusually weak or tired Side effects that usually do not require medical attention (report to your doctor or health care professional  if they continue or are bothersome): -constipation -diarrhea -headache -joint pain -loss of appetite -muscle pain -runny nose -tiredness -upset stomach This list may not describe all possible side effects. Call your doctor for medical advice about side effects. You may report side effects to FDA at 1-800-FDA-1088. Where should I keep my medicine? This medicine is only given in a clinic, doctor's office, or other health care setting and will not be stored at home. NOTE: This sheet is a summary. It may not cover all possible information. If you have questions about this medicine, talk to your doctor, pharmacist, or health care provider.  2018 [employer]vier/Gold Standard (2016-12-19 19:17:21)

## 2018-04-18 ENCOUNTER — Other Ambulatory Visit: Payer: Self-pay | Admitting: Hematology

## 2018-04-25 NOTE — Progress Notes (Signed)
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 His labs today 12/14/2017 show hgb 8.3, platelets 524k. He is doing well overall. He has staples in his right upper leg following a recent surgery and has not been in touch with the surgeons for a f/u of this. He is not receiving physical therapy and is not getting around at home.  He notes that he was not scheduled for a post hospitalization nephrology f/u and has no PCP.  On review of systems, pt reports intermittent fatigue, SOB, pain to the left leg, dizziness, dry mouth and denies fever, chills, night sweats, dysuria and any other accompanying symptoms.   INTERVAL HISTORY:   Andres Fernandez presents today for follow up and C8D4 of treatment of his myeloma/plasma cell leukemia. The patient's last visit with [name] was on 04/08/18. The pt reports that he is doing well overall. The pt saw Dr Linus Salmons with ID for his chronic hepatitis C on 04/11/18.  The pt reports no new concerns and is now ready to have a referral to see Dr Norma Fredrickson at [facility] for a discussion of his candidacy for a transplant. 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   Of note since the patient's last visit, pt has had US Abdomen and hepatic Elastogrpahy completed on 04/26/18 with results revealing ULTRASOUND ABDOMEN: Normal upper abdominal ultrasound ULTRASOUND HEPATIC ELASTOGRAPHY: Median hepatic shear wave velocity is calculated at 1.21 m/sec. Corresponding Metavir fibrosis score is F2 + some F3. Risk of fibrosis is Moderate.  Lab results today (04/29/18) of CBC, CMP, and Reticulocytes is as follows: all values are WNL except for RBC at 3.66, Hgb at 11.1, HCT at 32.8, RDW at 14.9, Lymphs Abs at 0.8k, Monocytes abs at 1.3k, Chloride at  111, BUN at 32, Creatinine at 2.01, AST at 38. MMP 04/26/18 is pending Kappa/Lambda 04/26/18 is pending   On review of systems, pt reports increasing energy levels, increasing appetite, mild tingling in his hands, and denies tingling in his feet, and any other symptoms.   REVIEW OF SYSTEMS:   A 10+ POINT REVIEW OF SYSTEMS WAS OBTAINED including neurology, dermatology, psychiatry, cardiac, respiratory, lymph, extremities, GI, GU, Musculoskeletal, constitutional, breasts, reproductive, HEENT.  All pertinent positives are noted in the HPI.  All others are negative.    MEDICAL HISTORY:  Past Medical History:  Diagnosis Date  . BPH (benign prostatic hyperplasia)   . Hepatitis C 11/27/2017  . Hypertension   . Plasma cell leukemia ([place]) 11/23/2017  . Tobacco abuse     SURGICAL HISTORY: Past Surgical History:  Procedure Laterality Date  . APPENDECTOMY    . FEMUR IM NAIL Right 11/20/2017   Procedure: RIGHT FEMORAL INTRAMEDULLARY (IM) NAIL;  Surgeon: Nicholes Stairs, MD;  Location: [place];  Service: Orthopedics;  Laterality: Right;    SOCIAL HISTORY: Social History   Socioeconomic History  . Marital status: Single    Spouse name: Not on file  . Number of children: Not on file  . Years of education: Not on file  . Highest education level: Not on file  Occupational History  . Not on file  Social Needs  . Financial resource strain: Not on file  . Food insecurity:    Worry: Not on file  Inability: Not on file  . Transportation needs:    Medical: Not on file    Non-medical: Not on file  Tobacco Use  . Smoking status: Current Every Day Smoker    Packs/day: 0.25    Types: Cigarettes    Last attempt to quit: 11/09/2017    Years since quitting: 0.4  . Smokeless tobacco: Never Used  Substance and Sexual Activity  . Alcohol use: Yes  . Drug use: No  . Sexual activity: Not on file  Lifestyle  . Physical activity:    Days per week: Not on file    Minutes per session: Not on  file  . Stress: Not on file  Relationships  . Social connections:    Talks on phone: Not on file    Gets together: Not on file    Attends religious service: Not on file    Active member of club or organization: Not on file    Attends meetings of clubs or organizations: Not on file    Relationship status: Not on file  . Intimate partner violence:    Fear of current or ex partner: Not on file    Emotionally abused: Not on file    Physically abused: Not on file    Forced sexual activity: Not on file  Other Topics Concern  . Not on file  Social History Narrative  . Not on file    FAMILY HISTORY: Family History  Problem Relation Age of Onset  . COPD Mother   . Liver cancer Mother     ALLERGIES:  has No Known Allergies.  MEDICATIONS:  . Current Outpatient Medications on File Prior to Visit  Medication Sig Dispense Refill  . acyclovir (ZOVIRAX) 400 MG tablet Take 1 tablet (400 mg total) by mouth daily. 30 tablet 6  . cholecalciferol (VITAMIN D) 400 units TABS tablet Take 400 Units by mouth daily.    . cyclophosphamide (CYTOXAN) 50 MG capsule Take 11 caps (517m) by mouth once weekly on days 1, 8, &15, every 21 days. Take with breakfast, maintain hydration. 33 capsule 0  . dexamethasone (DECADRON) 4 MG tablet Take 5 tablets (20 mg total) by mouth once a week. Every Saturday morning 20 tablet 1  . Elbasvir-Grazoprevir (ZEPATIER) 50-100 MG TABS Take 1 tablet by mouth daily. (Patient not taking: Reported on 04/29/2018) 28 tablet 2  . NICOTINE STEP 2 14 MG/24HR patch PLACE 1 PATCH (14 MG TOTAL) ONTO THE SKIN DAILY. 28 patch 1  . ondansetron (ZOFRAN) 4 MG tablet Take 2 tablets (8 mg total) by mouth every 8 (eight) hours as needed for nausea. 30 tablet 0  . traZODone (DESYREL) 50 MG tablet Take 1 tablet (50 mg total) by mouth at bedtime. 30 tablet 0   No current facility-administered medications on file prior to visit.     PHYSICAL EXAMINATION:  VS reviewed Vitals:   04/29/18 0913    BP: 139/88  Pulse: 82  Resp: 17  Temp: 98 F (36.7 C)  SpO2: 100%     GENERAL:alert, in no acute distress and comfortable SKIN: no acute rashes, no significant lesions EYES: conjunctiva are pink and non-injected, sclera anicteric OROPHARYNX: MMM, no exudates, no oropharyngeal erythema or ulceration NECK: supple, no JVD LYMPH:  no palpable lymphadenopathy in the cervical, axillary or inguinal regions LUNGS: clear to auscultation b/l with normal respiratory effort HEART: regular rate & rhythm ABDOMEN:  normoactive bowel sounds , non tender, not distended. Extremity: no pedal edema PSYCH: alert & oriented x  3 with fluent speech NEURO: no focal motor/sensory deficits   LABORATORY DATA:  I have reviewed the data as listed  . CBC Latest Ref Rng & Units 04/29/2018 04/26/2018 04/15/2018  WBC 4.0 - 10.3 K/uL 5.0 4.9 5.3  Hemoglobin 13.0 - 17.1 g/dL 11.1(L) 11.9(L) 11.7(L)  Hematocrit 38.4 - 49.9 % 32.8(L) 34.5(L) 34.3(L)  Platelets 140 - 400 K/uL 308 315 225  hgb 11.6 . CBC    Component Value Date/Time   WBC 5.0 04/29/2018 0901   WBC 3.7 (L) 04/11/2018 1059   RBC 3.66 (L) 04/29/2018 0901   RBC 3.66 (L) 04/29/2018 0901   HGB 11.1 (L) 04/29/2018 0901   HGB 8.3 (L) 12/14/2017 1230   HCT 32.8 (L) 04/29/2018 0901   HCT 23.9 (L) 12/14/2017 1230   PLT 308 04/29/2018 0901   PLT 524 (H) 12/14/2017 1230   MCV 89.6 04/29/2018 0901   MCV 88.4 12/14/2017 1230   MCH 30.3 04/29/2018 0901   MCHC 33.8 04/29/2018 0901   RDW 14.9 (H) 04/29/2018 0901   RDW 15.3 (H) 12/14/2017 1230   LYMPHSABS 0.8 (L) 04/29/2018 0901   LYMPHSABS 1.4 12/14/2017 1230   MONOABS 1.3 (H) 04/29/2018 0901   MONOABS 0.6 12/14/2017 1230   EOSABS 0.1 04/29/2018 0901   EOSABS 0.1 12/14/2017 1230   BASOSABS 0.0 04/29/2018 0901   BASOSABS 0.1 12/14/2017 1230     . CMP Latest Ref Rng & Units 04/26/2018 04/15/2018 04/12/2018  Glucose 70 - 140 mg/dL 100 82 96  BUN 7 - 26 mg/dL 29(H) 33(H) 28(H)  Creatinine 0.70 - 1.30  mg/dL 1.94(H) 2.03(H) 1.85(H)  Sodium 136 - 145 mmol/L 141 141 141  Potassium 3.5 - 5.1 mmol/L 4.7 4.6 5.2(H)  Chloride 98 - 109 mmol/L 112(H) 111(H) 112(H)  CO2 22 - 29 mmol/L 21(L) 23 23  Calcium 8.4 - 10.4 mg/dL 8.6 8.7 9.5  Total Protein 6.4 - 8.3 g/dL 7.1 6.9 6.8  Total Bilirubin 0.2 - 1.2 mg/dL 0.3 <0.2(L) <0.2(L)  Alkaline Phos 40 - 150 U/L 73 76 78  AST 5 - 34 U/L 31 54(H) 40(H)  ALT 0 - 55 U/L 42 58(H) 44   Component     Latest Ref Rng & Units 11/17/2017 11/23/2017 12/01/2017 01/11/2018  Kappa free light chain     3.3 - 19.4 mg/L 27,634.1 (H) 13,734.8 (H) 6,931.2 (H) 437.3 (H)  Lamda free light chains     5.7 - 26.3 mg/L 5.8 2.3 (L) 9.8 20.9  Kappa, lamda light chain ratio     0.26 - 1.65 4,764.50 (H) 5,971.65 (H) 707.27 (H) 20.92 (H)   Component     Latest Ref Rng & Units 02/22/2018 02/25/2018 03/15/2018 04/05/2018  Kappa free light chain     3.3 - 19.4 mg/L 182.7 (H) 132.1 (H) 130.9 (H) 121.5 (H)  Lamda free light chains     5.7 - 26.3 mg/L 12.9 12.7 12.1 12.9  Kappa, lamda light chain ratio     0.26 - 1.65 14.16 (H) 10.40 (H) 10.82 (H) 9.42 (H)           RADIOGRAPHIC STUDIES: I have personally reviewed the radiological images as listed and agreed with the findings in the report. US Abdomen Complete W/elastography  Result Date: 04/26/2018 CLINICAL DATA:  Chronic hepatitis-C without hepatic coma, history hypertension, smoking, plasma cell leukemia EXAM: ULTRASOUND ABDOMEN ULTRASOUND HEPATIC ELASTOGRAPHY TECHNIQUE: Sonography of the upper abdomen was performed. In addition, ultrasound elastography evaluation of the liver was performed. A region of interest  was placed within the right lobe of the liver. Following application of a compressive sonographic pulse, shear waves were detected in the adjacent hepatic tissue and the shear wave velocity was calculated. Multiple assessments were performed at the selected site. Median shear wave velocity is correlated to a Metavir  fibrosis score. COMPARISON:  11/29/2017; correlation CT abdomen and pelvis 11/25/2017 FINDINGS: ULTRASOUND ABDOMEN Gallbladder: Normally distended without stones or wall thickening. No pericholecystic fluid or sonographic Murphy sign. Common bile duct: Diameter: 4 mm, normal Liver: Normal appearance. No focal hepatic mass or nodularity. Normal appearance IVC: Normal appearance Pancreas: Normal appearance, 4.6 cm length Spleen: 10.1 cm Right Kidney: Length: 11.5 cm. Upper normal cortical echogenicity. No definite mass or hydronephrosis. Left Kidney: Length: 11.5 cm. Upper normal cortical echogenicity. No definite mass or hydronephrosis. Abdominal aorta: Normal caliber Other findings: No free fluid ULTRASOUND HEPATIC ELASTOGRAPHY Device: Siemens Helix VTQ Patient position: Left Lateral Decubitus Transducer 5C Number of measurements: 10 Hepatic segment:  8 Median velocity:   1.21 m/sec IQR: 0.11 IQR/Median velocity ratio: 0.09 Corresponding Metavir fibrosis score:  F2 + some F3 Risk of fibrosis: Moderate Limitations of exam: None Please note that abnormal shear wave velocities may also be identified in clinical settings other than with hepatic fibrosis, such as: acute hepatitis, elevated right heart and central venous pressures including use of beta blockers, veno-occlusive disease (Budd-Chiari), infiltrative processes such as mastocytosis/amyloidosis/infiltrative tumor, extrahepatic cholestasis, in the post-prandial state, and liver transplantation. Correlation with patient history, laboratory data, and clinical condition recommended. IMPRESSION: ULTRASOUND ABDOMEN: Normal upper abdominal ultrasound ULTRASOUND HEPATIC ELASTOGRAPHY: Median hepatic shear wave velocity is calculated at 1.21 m/sec. Corresponding Metavir fibrosis score is F2 + some F3. Risk of fibrosis is Moderate. Follow-up: Additional testing appropriate Electronically Signed   By: Lavonia Dana M.D.   On: 04/26/2018 10:15    ASSESSMENT & PLAN:   62  y.o.A male presenting with severe hypercalcemia, skeletal findings consistent with myeloma and significant leukocytosis/lymphocytosis in the peripheral blood as well as AKI and hypercalcemia.  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 -Hypercalcemia -Renal Failure -Anemia -Extensive Bone lesions -Appears to be primarily Kappa Light chain Myeloma with no overt M spike. Appears to be responding to treatment.- with decreased kappa light chains.   BM Bx showed >95% involvement with kappa restricted serum free light chains. 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 MoL Cy-    PLAN -Discussed pt labwork today, 04/29/18; blood counts and chemistries are stable.  -Discussed that 04/26/18 US Abdomen that noted concern for possible fibrosis that would imply moderate risk of cirrhosis. -Will refer pt to see Dr Norma Fredrickson at [facility] for transplant discussion. -Pt will let us know who his current PCP is soon -Will repeat labs after C8  -Discussed the options of maintenance vs continued induction  -Will see pt back in 3-4 weeks with discussion of treatment goals moving forward  -The pt has no prohibitive toxicities from continuing treatment at this time.  -continue CyBorD treatment as per orders.will continue till max response. -counseled to maintain compliance with f/u to Hep C clinic for evaluation and mx of recently noted Hep C -- this could potentially be a transplant consideration as well. -continue Acyclovir prophylaxis. -weekly labs on D1 AND D8 each cycle -continue Xgeva q4weeks   2) s/p Prophylactic IM nailing for rt femur. -Advised that we will re-evaluate the patient returning to work by March on the next visit following Physical Therapy at  the [facility].  -patient reports improvement and is currently progressively back to full time work.  3) Thrombocytopenia resolved  4)Renal insufficiency -- likely  primarily related to Myeloma -Kidney numbers improved. 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  7) Renal insuff- due to myeloma.  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 Plan -Hgb at 11.2 - stable   -continue treatment per orders. -plz schedule C9 of treatment -RTC with Dr Irene Limbo in 3 weeks with C9D4 of treatment. -Labs D1 and D8 of each cycle    . The total time spent in the appointment was 25 minutes and more than 50% was on counseling and direct patient cares.       Sullivan Lone MD [place] AAHIVMS [facility] [place] Hematology/Oncology Physician [facility]  (Office):       [phone] (Work cell):  [phone] (Fax):           [phone]  This document serves as a record of services personally performed by Sullivan Lone, MD. It was created on his behalf by Baldwin Jamaica, a trained medical scribe. The creation of this record is based on the scribe's personal observations and the provider's statements to them.   .I have reviewed the above documentation for accuracy and completeness, and I agree with the above. Brunetta Genera MD MS

## 2018-04-26 ENCOUNTER — Ambulatory Visit (HOSPITAL_COMMUNITY)
Admission: RE | Admit: 2018-04-26 | Discharge: 2018-04-26 | Disposition: A | Discharge status: Home / Self Care | Payer: BLUE CROSS/BLUE SHIELD | Source: Ambulatory Visit | Attending: Internal Medicine | Admitting: Internal Medicine

## 2018-04-26 ENCOUNTER — Inpatient Hospital Stay: Payer: BLUE CROSS/BLUE SHIELD

## 2018-04-26 ENCOUNTER — Other Ambulatory Visit: Payer: Self-pay | Admitting: Internal Medicine

## 2018-04-26 ENCOUNTER — Other Ambulatory Visit: Payer: Self-pay | Admitting: Pharmacist Clinician (PhC)/ Clinical Pharmacy Specialist

## 2018-04-26 ENCOUNTER — Other Ambulatory Visit: Payer: Self-pay | Admitting: Hematology

## 2018-04-26 VITALS — BP 130/79 | HR 75 | Temp 98.2°F | Resp 17 | Ht 68.0 in | Wt 158.5 lb

## 2018-04-26 DIAGNOSIS — C9000 Multiple myeloma not having achieved remission: Secondary | ICD-10-CM

## 2018-04-26 DIAGNOSIS — B182 Chronic viral hepatitis C: Secondary | ICD-10-CM | POA: Diagnosis present

## 2018-04-26 DIAGNOSIS — Z7189 Other specified counseling: Secondary | ICD-10-CM

## 2018-04-26 DIAGNOSIS — C9010 Plasma cell leukemia not having achieved remission: Secondary | ICD-10-CM

## 2018-04-26 DIAGNOSIS — Z5111 Encounter for antineoplastic chemotherapy: Secondary | ICD-10-CM | POA: Diagnosis not present

## 2018-04-26 LAB — CMP (CANCER CENTER ONLY)
ALBUMIN: 4.1 g/dL (ref 3.5–5.0)
ALT: 42 U/L (ref 0–55)
ANION GAP: 8 (ref 3–11)
AST: 31 U/L (ref 5–34)
Alkaline Phosphatase: 73 U/L (ref 40–150)
BILIRUBIN TOTAL: 0.3 mg/dL (ref 0.2–1.2)
BUN: 29 mg/dL — ABNORMAL HIGH (ref 7–26)
CHLORIDE: 112 mmol/L — AB (ref 98–109)
CO2: 21 mmol/L — AB (ref 22–29)
Calcium: 8.6 mg/dL (ref 8.4–10.4)
Creatinine: 1.94 mg/dL — ABNORMAL HIGH (ref 0.70–1.30)
GFR, Est AFR Am: 42 mL/min — ABNORMAL LOW (ref >60)
GFR, Estimated: 36 mL/min — ABNORMAL LOW (ref >60)
GLUCOSE: 100 mg/dL (ref 70–140)
POTASSIUM: 4.7 mmol/L (ref 3.5–5.1)
SODIUM: 141 mmol/L (ref 136–145)
TOTAL PROTEIN: 7.1 g/dL (ref 6.4–8.3)

## 2018-04-26 LAB — CBC WITH DIFFERENTIAL (CANCER CENTER ONLY)
BASOS ABS: 0 10*3/uL (ref 0.0–0.1)
Basophils Relative: 1 %
Eosinophils Absolute: 0.1 10*3/uL (ref 0.0–0.5)
Eosinophils Relative: 2 %
HCT: 34.5 % — ABNORMAL LOW (ref 38.4–49.9)
Hemoglobin: 11.9 g/dL — ABNORMAL LOW (ref 13.0–17.1)
LYMPHS PCT: 26 %
Lymphs Abs: 1.3 10*3/uL (ref 0.9–3.3)
MCH: 30.7 pg (ref 27.2–33.4)
MCHC: 34.5 g/dL (ref 32.0–36.0)
MCV: 88.9 fL (ref 79.3–98.0)
MONO ABS: 0.6 10*3/uL (ref 0.1–0.9)
Monocytes Relative: 11 %
NEUTROS ABS: 2.9 10*3/uL (ref 1.5–6.5)
Neutrophils Relative %: 60 %
PLATELETS: 315 10*3/uL (ref 140–400)
RBC: 3.88 MIL/uL — AB (ref 4.20–5.82)
RDW: 14.6 % (ref 11.0–14.6)
WBC Count: 4.9 10*3/uL (ref 4.0–10.3)

## 2018-04-26 LAB — RETICULOCYTES
RBC.: 3.88 MIL/uL — ABNORMAL LOW (ref 4.20–5.82)
RETIC COUNT ABSOLUTE: 42.7 10*3/uL (ref 34.8–93.9)
Retic Ct Pct: 1.1 % (ref 0.8–1.8)

## 2018-04-26 MED ORDER — BORTEZOMIB CHEMO SQ INJECTION 3.5 MG (2.5MG/ML)
1.3000 mg/m2 | Freq: Once | INTRAMUSCULAR | Status: AC
  Administered 2018-04-26: 2.25 mg via SUBCUTANEOUS
  Filled 2018-04-26: qty 2.25

## 2018-04-26 MED ORDER — ELBASVIR-GRAZOPREVIR 50-100 MG PO TABS: 1.0000 | ORAL_TABLET | Freq: Every day | ORAL | 2 refills | Status: DC

## 2018-04-26 MED ORDER — ONDANSETRON HCL 8 MG PO TABS
4.0000 mg | ORAL_TABLET | Freq: Once | ORAL | Status: AC
  Administered 2018-04-26: 4 mg via ORAL

## 2018-04-26 MED ORDER — ONDANSETRON HCL 8 MG PO TABS
ORAL_TABLET | ORAL | Status: AC
  Filled 2018-04-26: qty 1

## 2018-04-26 NOTE — Patient Instructions (Signed)
[facility] Discharge Instructions for Patients Receiving Chemotherapy  Today you received the following chemotherapy agents:  Velcade (bortezomib)  To help prevent nausea and vomiting after your treatment, we encourage you to take your nausea medication as prescribed.   If you develop nausea and vomiting that is not controlled by your nausea medication, call the clinic.   BELOW ARE SYMPTOMS THAT SHOULD BE REPORTED IMMEDIATELY:  *FEVER GREATER THAN 100.5 F  *CHILLS WITH OR WITHOUT FEVER  NAUSEA AND VOMITING THAT IS NOT CONTROLLED WITH YOUR NAUSEA MEDICATION  *UNUSUAL SHORTNESS OF BREATH  *UNUSUAL BRUISING OR BLEEDING  TENDERNESS IN MOUTH AND THROAT WITH OR WITHOUT PRESENCE OF ULCERS  *URINARY PROBLEMS  *BOWEL PROBLEMS  UNUSUAL RASH Items with * indicate a potential emergency and should be followed up as soon as possible.  Feel free to call the clinic should you have any questions or concerns. The clinic phone number is [phone].  Please show the CHEMO ALERT CARD at check-in to the Emergency Department and triage nurse.   

## 2018-04-26 NOTE — Progress Notes (Signed)
Per Dr Irene Limbo OK to give Velcade with CRT of 1.93

## 2018-04-29 ENCOUNTER — Inpatient Hospital Stay (HOSPITAL_BASED_OUTPATIENT_CLINIC_OR_DEPARTMENT_OTHER): Payer: BLUE CROSS/BLUE SHIELD | Admitting: Hematology

## 2018-04-29 ENCOUNTER — Encounter: Payer: Self-pay | Admitting: *Deleted

## 2018-04-29 ENCOUNTER — Other Ambulatory Visit: Payer: Self-pay | Admitting: Internal Medicine

## 2018-04-29 ENCOUNTER — Inpatient Hospital Stay: Payer: BLUE CROSS/BLUE SHIELD

## 2018-04-29 VITALS — BP 139/88 | HR 82 | Temp 98.0°F | Resp 17 | Ht 68.0 in | Wt 166.0 lb

## 2018-04-29 DIAGNOSIS — Z720 Tobacco use: Secondary | ICD-10-CM | POA: Diagnosis not present

## 2018-04-29 DIAGNOSIS — C9010 Plasma cell leukemia not having achieved remission: Secondary | ICD-10-CM

## 2018-04-29 DIAGNOSIS — Z5111 Encounter for antineoplastic chemotherapy: Secondary | ICD-10-CM | POA: Diagnosis not present

## 2018-04-29 DIAGNOSIS — C9000 Multiple myeloma not having achieved remission: Secondary | ICD-10-CM

## 2018-04-29 DIAGNOSIS — Z7189 Other specified counseling: Secondary | ICD-10-CM

## 2018-04-29 DIAGNOSIS — N400 Enlarged prostate without lower urinary tract symptoms: Secondary | ICD-10-CM

## 2018-04-29 DIAGNOSIS — B1920 Unspecified viral hepatitis C without hepatic coma: Secondary | ICD-10-CM

## 2018-04-29 DIAGNOSIS — I10 Essential (primary) hypertension: Secondary | ICD-10-CM | POA: Diagnosis not present

## 2018-04-29 DIAGNOSIS — D649 Anemia, unspecified: Secondary | ICD-10-CM | POA: Diagnosis not present

## 2018-04-29 LAB — CBC WITH DIFFERENTIAL (CANCER CENTER ONLY)
BASOS ABS: 0 10*3/uL (ref 0.0–0.1)
BASOS PCT: 1 %
EOS PCT: 1 %
Eosinophils Absolute: 0.1 10*3/uL (ref 0.0–0.5)
HEMATOCRIT: 32.8 % — AB (ref 38.4–49.9)
Hemoglobin: 11.1 g/dL — ABNORMAL LOW (ref 13.0–17.1)
LYMPHS PCT: 16 %
Lymphs Abs: 0.8 10*3/uL — ABNORMAL LOW (ref 0.9–3.3)
MCH: 30.3 pg (ref 27.2–33.4)
MCHC: 33.8 g/dL (ref 32.0–36.0)
MCV: 89.6 fL (ref 79.3–98.0)
Monocytes Absolute: 1.3 10*3/uL — ABNORMAL HIGH (ref 0.1–0.9)
Monocytes Relative: 26 %
NEUTROS ABS: 2.8 10*3/uL (ref 1.5–6.5)
Neutrophils Relative %: 56 %
PLATELETS: 308 10*3/uL (ref 140–400)
RBC: 3.66 MIL/uL — AB (ref 4.20–5.82)
RDW: 14.9 % — ABNORMAL HIGH (ref 11.0–14.6)
WBC: 5 10*3/uL (ref 4.0–10.3)

## 2018-04-29 LAB — KAPPA/LAMBDA LIGHT CHAINS
Kappa free light chain: 108.6 mg/L — ABNORMAL HIGH (ref 3.3–19.4)
Kappa, lambda light chain ratio: 10.97 — ABNORMAL HIGH (ref 0.26–1.65)
LAMDA FREE LIGHT CHAINS: 9.9 mg/L (ref 5.7–26.3)

## 2018-04-29 LAB — CMP (CANCER CENTER ONLY)
ALT: 45 U/L (ref 0–55)
AST: 38 U/L — ABNORMAL HIGH (ref 5–34)
Albumin: 3.7 g/dL (ref 3.5–5.0)
Alkaline Phosphatase: 64 U/L (ref 40–150)
Anion gap: 9 (ref 3–11)
BUN: 32 mg/dL — ABNORMAL HIGH (ref 7–26)
CO2: 22 mmol/L (ref 22–29)
Calcium: 9.1 mg/dL (ref 8.4–10.4)
Chloride: 111 mmol/L — ABNORMAL HIGH (ref 98–109)
Creatinine: 2.01 mg/dL — ABNORMAL HIGH (ref 0.70–1.30)
GFR, Est AFR Am: 40 mL/min — ABNORMAL LOW
GFR, Estimated: 34 mL/min — ABNORMAL LOW
Glucose, Bld: 75 mg/dL (ref 70–140)
Potassium: 4.8 mmol/L (ref 3.5–5.1)
Sodium: 142 mmol/L (ref 136–145)
Total Bilirubin: 0.3 mg/dL (ref 0.2–1.2)
Total Protein: 6.5 g/dL (ref 6.4–8.3)

## 2018-04-29 LAB — RETICULOCYTES
RBC.: 3.66 MIL/uL — ABNORMAL LOW (ref 4.20–5.82)
Retic Count, Absolute: 40.3 K/uL (ref 34.8–93.9)
Retic Ct Pct: 1.1 % (ref 0.8–1.8)

## 2018-04-29 MED ORDER — PROCHLORPERAZINE MALEATE 10 MG PO TABS: 10.0000 mg | ORAL_TABLET | Freq: Once | ORAL | Status: DC

## 2018-04-29 MED ORDER — BORTEZOMIB CHEMO SQ INJECTION 3.5 MG (2.5MG/ML)
1.3000 mg/m2 | Freq: Once | INTRAMUSCULAR | Status: AC
  Administered 2018-04-29: 2.25 mg via SUBCUTANEOUS
  Filled 2018-04-29: qty 2.25

## 2018-04-29 MED ORDER — ONDANSETRON HCL 8 MG PO TABS
4.0000 mg | ORAL_TABLET | Freq: Once | ORAL | Status: AC
  Administered 2018-04-29: 4 mg via ORAL

## 2018-04-29 MED ORDER — ONDANSETRON HCL 8 MG PO TABS
ORAL_TABLET | ORAL | Status: AC
  Filled 2018-04-29: qty 1

## 2018-04-29 NOTE — Progress Notes (Signed)
Ok to treat with creatinine of 2.01 per Irene Limbo, MD

## 2018-04-29 NOTE — Patient Instructions (Signed)
[facility] Discharge Instructions for Patients Receiving Chemotherapy  Today you received the following chemotherapy agents:  Velcade (bortezomib)  To help prevent nausea and vomiting after your treatment, we encourage you to take your nausea medication as prescribed.   If you develop nausea and vomiting that is not controlled by your nausea medication, call the clinic.   BELOW ARE SYMPTOMS THAT SHOULD BE REPORTED IMMEDIATELY:  *FEVER GREATER THAN 100.5 F  *CHILLS WITH OR WITHOUT FEVER  NAUSEA AND VOMITING THAT IS NOT CONTROLLED WITH YOUR NAUSEA MEDICATION  *UNUSUAL SHORTNESS OF BREATH  *UNUSUAL BRUISING OR BLEEDING  TENDERNESS IN MOUTH AND THROAT WITH OR WITHOUT PRESENCE OF ULCERS  *URINARY PROBLEMS  *BOWEL PROBLEMS  UNUSUAL RASH Items with * indicate a potential emergency and should be followed up as soon as possible.  Feel free to call the clinic should you have any questions or concerns. The clinic phone number is [phone].  Please show the CHEMO ALERT CARD at check-in to the Emergency Department and triage nurse.   

## 2018-04-30 ENCOUNTER — Telehealth: Payer: Self-pay

## 2018-04-30 NOTE — Telephone Encounter (Signed)
Per 5/20 los additional appointments were added to current schedule. Mailed patient a copy of the calender and a letter enclosed.

## 2018-05-01 ENCOUNTER — Other Ambulatory Visit: Payer: Self-pay | Admitting: *Deleted

## 2018-05-01 DIAGNOSIS — C9000 Multiple myeloma not having achieved remission: Secondary | ICD-10-CM

## 2018-05-01 MED ORDER — CYCLOPHOSPHAMIDE 50 MG PO CAPS: ORAL_CAPSULE | ORAL | 0 refills | Status: DC

## 2018-05-02 ENCOUNTER — Other Ambulatory Visit: Payer: Self-pay | Admitting: Pharmacist Clinician (PhC)/ Clinical Pharmacy Specialist

## 2018-05-02 MED ORDER — GLECAPREVIR-PIBRENTASVIR 100-40 MG PO TABS: 3.0000 | ORAL_TABLET | Freq: Every day | ORAL | 1 refills | Status: DC

## 2018-05-02 NOTE — Progress Notes (Signed)
He was denied for Zepatier. He has multiple myeloma so we were trying to choose something that would not interact with cyclophosphamide/Velcade. He has also has renal impairment. He has 1b virus and an F score of 2/3. We will use Mavy[place] x 8 wks instead. This would not be an issue with his chemo either and ok with his renal impairment.

## 2018-05-03 ENCOUNTER — Inpatient Hospital Stay: Payer: BLUE CROSS/BLUE SHIELD

## 2018-05-03 VITALS — BP 126/88 | HR 79 | Temp 98.2°F | Resp 18

## 2018-05-03 DIAGNOSIS — Z5111 Encounter for antineoplastic chemotherapy: Secondary | ICD-10-CM | POA: Diagnosis not present

## 2018-05-03 DIAGNOSIS — C9010 Plasma cell leukemia not having achieved remission: Secondary | ICD-10-CM

## 2018-05-03 DIAGNOSIS — C9000 Multiple myeloma not having achieved remission: Secondary | ICD-10-CM

## 2018-05-03 DIAGNOSIS — Z7189 Other specified counseling: Secondary | ICD-10-CM

## 2018-05-03 LAB — MULTIPLE MYELOMA PANEL, SERUM
ALBUMIN/GLOB SERPL: 1.5 (ref 0.7–1.7)
ALPHA 1: 0.2 g/dL (ref 0.0–0.4)
Albumin SerPl Elph-Mcnc: 3.9 g/dL (ref 2.9–4.4)
Alpha2 Glob SerPl Elph-Mcnc: 1 g/dL (ref 0.4–1.0)
B-Globulin SerPl Elph-Mcnc: 0.9 g/dL (ref 0.7–1.3)
Gamma Glob SerPl Elph-Mcnc: 0.5 g/dL (ref 0.4–1.8)
Globulin, Total: 2.7 g/dL (ref 2.2–3.9)
IGA: 16 mg/dL — AB (ref 90–386)
IGM (IMMUNOGLOBULIN M), SRM: 11 mg/dL — AB (ref 20–172)
IgG (Immunoglobin G), Serum: 693 mg/dL — ABNORMAL LOW (ref 700–1600)
Total Protein ELP: 6.6 g/dL (ref 6.0–8.5)

## 2018-05-03 LAB — CBC WITH DIFFERENTIAL (CANCER CENTER ONLY)
BASOS PCT: 1 %
Basophils Absolute: 0 10*3/uL (ref 0.0–0.1)
Eosinophils Absolute: 0.1 10*3/uL (ref 0.0–0.5)
Eosinophils Relative: 2 %
HEMATOCRIT: 32.3 % — AB (ref 38.4–49.9)
HEMOGLOBIN: 11.2 g/dL — AB (ref 13.0–17.1)
LYMPHS ABS: 1.2 10*3/uL (ref 0.9–3.3)
LYMPHS PCT: 27 %
MCH: 30.9 pg (ref 27.2–33.4)
MCHC: 34.7 g/dL (ref 32.0–36.0)
MCV: 89 fL (ref 79.3–98.0)
Monocytes Absolute: 0.8 10*3/uL (ref 0.1–0.9)
Monocytes Relative: 18 %
NEUTROS ABS: 2.4 10*3/uL (ref 1.5–6.5)
NEUTROS PCT: 52 %
Platelet Count: 239 10*3/uL (ref 140–400)
RBC: 3.63 MIL/uL — ABNORMAL LOW (ref 4.20–5.82)
RDW: 14.7 % — AB (ref 11.0–14.6)
WBC Count: 4.5 10*3/uL (ref 4.0–10.3)

## 2018-05-03 LAB — RETICULOCYTES
RBC.: 3.63 MIL/uL — AB (ref 4.20–5.82)
RETIC COUNT ABSOLUTE: 50.8 10*3/uL (ref 34.8–93.9)
Retic Ct Pct: 1.4 % (ref 0.8–1.8)

## 2018-05-03 LAB — CMP (CANCER CENTER ONLY)
ALT: 46 U/L (ref 0–55)
ANION GAP: 9 (ref 3–11)
AST: 43 U/L — ABNORMAL HIGH (ref 5–34)
Albumin: 3.9 g/dL (ref 3.5–5.0)
Alkaline Phosphatase: 64 U/L (ref 40–150)
BUN: 25 mg/dL (ref 7–26)
CALCIUM: 7.8 mg/dL — AB (ref 8.4–10.4)
CHLORIDE: 111 mmol/L — AB (ref 98–109)
CO2: 22 mmol/L (ref 22–29)
Creatinine: 1.99 mg/dL — ABNORMAL HIGH (ref 0.70–1.30)
GFR, EST AFRICAN AMERICAN: 40 mL/min — AB (ref >60)
GFR, Estimated: 35 mL/min — ABNORMAL LOW (ref >60)
Glucose, Bld: 78 mg/dL (ref 70–140)
POTASSIUM: 5.2 mmol/L — AB (ref 3.5–5.1)
SODIUM: 142 mmol/L (ref 136–145)
Total Bilirubin: 0.3 mg/dL (ref 0.2–1.2)
Total Protein: 6.6 g/dL (ref 6.4–8.3)

## 2018-05-03 MED ORDER — ONDANSETRON HCL 8 MG PO TABS
ORAL_TABLET | ORAL | Status: AC
  Filled 2018-05-03: qty 1

## 2018-05-03 MED ORDER — BORTEZOMIB CHEMO SQ INJECTION 3.5 MG (2.5MG/ML)
1.3000 mg/m2 | Freq: Once | INTRAMUSCULAR | Status: AC
  Administered 2018-05-03: 2.25 mg via SUBCUTANEOUS
  Filled 2018-05-03: qty 2.25

## 2018-05-03 MED ORDER — ONDANSETRON HCL 8 MG PO TABS
4.0000 mg | ORAL_TABLET | Freq: Once | ORAL | Status: AC
  Administered 2018-05-03: 4 mg via ORAL

## 2018-05-03 NOTE — Progress Notes (Signed)
Ok to treat with SCr 1.99 per Dr. Irene Limbo.

## 2018-05-03 NOTE — Progress Notes (Signed)
Sounds good, thanks.

## 2018-05-03 NOTE — Patient Instructions (Signed)
[facility] Discharge Instructions for Patients Receiving Chemotherapy  Today you received the following chemotherapy agents:  Velcade (bortezomib)  To help prevent nausea and vomiting after your treatment, we encourage you to take your nausea medication as prescribed.   If you develop nausea and vomiting that is not controlled by your nausea medication, call the clinic.   BELOW ARE SYMPTOMS THAT SHOULD BE REPORTED IMMEDIATELY:  *FEVER GREATER THAN 100.5 F  *CHILLS WITH OR WITHOUT FEVER  NAUSEA AND VOMITING THAT IS NOT CONTROLLED WITH YOUR NAUSEA MEDICATION  *UNUSUAL SHORTNESS OF BREATH  *UNUSUAL BRUISING OR BLEEDING  TENDERNESS IN MOUTH AND THROAT WITH OR WITHOUT PRESENCE OF ULCERS  *URINARY PROBLEMS  *BOWEL PROBLEMS  UNUSUAL RASH Items with * indicate a potential emergency and should be followed up as soon as possible.  Feel free to call the clinic should you have any questions or concerns. The clinic phone number is [phone].  Please show the CHEMO ALERT CARD at check-in to the Emergency Department and triage nurse.   

## 2018-05-07 ENCOUNTER — Inpatient Hospital Stay: Payer: BLUE CROSS/BLUE SHIELD

## 2018-05-07 ENCOUNTER — Telehealth: Payer: Self-pay

## 2018-05-07 ENCOUNTER — Telehealth: Payer: Self-pay | Admitting: Hematology

## 2018-05-07 ENCOUNTER — Other Ambulatory Visit: Payer: Self-pay | Admitting: Pharmacist Clinician (PhC)/ Clinical Pharmacy Specialist

## 2018-05-07 VITALS — BP 144/89 | HR 82 | Temp 98.2°F | Resp 17

## 2018-05-07 DIAGNOSIS — M84551A Pathological fracture in neoplastic disease, right femur, initial encounter for fracture: Secondary | ICD-10-CM

## 2018-05-07 DIAGNOSIS — C9010 Plasma cell leukemia not having achieved remission: Secondary | ICD-10-CM

## 2018-05-07 DIAGNOSIS — C9000 Multiple myeloma not having achieved remission: Secondary | ICD-10-CM

## 2018-05-07 DIAGNOSIS — Z7189 Other specified counseling: Secondary | ICD-10-CM

## 2018-05-07 DIAGNOSIS — Z5111 Encounter for antineoplastic chemotherapy: Secondary | ICD-10-CM | POA: Diagnosis not present

## 2018-05-07 LAB — CBC WITH DIFFERENTIAL (CANCER CENTER ONLY)
BASOS PCT: 1 %
Basophils Absolute: 0 10*3/uL (ref 0.0–0.1)
Eosinophils Absolute: 0 10*3/uL (ref 0.0–0.5)
Eosinophils Relative: 1 %
HCT: 33.6 % — ABNORMAL LOW (ref 38.4–49.9)
HEMOGLOBIN: 11.4 g/dL — AB (ref 13.0–17.1)
Lymphocytes Relative: 21 %
Lymphs Abs: 0.9 10*3/uL (ref 0.9–3.3)
MCH: 31.1 pg (ref 27.2–33.4)
MCHC: 33.9 g/dL (ref 32.0–36.0)
MCV: 91.7 fL (ref 79.3–98.0)
MONOS PCT: 27 %
Monocytes Absolute: 1.2 10*3/uL — ABNORMAL HIGH (ref 0.1–0.9)
NEUTROS PCT: 50 %
Neutro Abs: 2.2 10*3/uL (ref 1.5–6.5)
Platelet Count: 246 10*3/uL (ref 140–400)
RBC: 3.66 MIL/uL — AB (ref 4.20–5.82)
RDW: 15.7 % — ABNORMAL HIGH (ref 11.0–14.6)
WBC: 4.3 10*3/uL (ref 4.0–10.3)

## 2018-05-07 LAB — COMPREHENSIVE METABOLIC PANEL WITH GFR
ALT: 41 U/L (ref 17–63)
AST: 34 U/L (ref 15–41)
Albumin: 3.8 g/dL (ref 3.5–5.0)
Alkaline Phosphatase: 51 U/L (ref 38–126)
Anion gap: 9 (ref 5–15)
BUN: 28 mg/dL — ABNORMAL HIGH (ref 6–20)
CO2: 22 mmol/L (ref 22–32)
Calcium: 8.3 mg/dL — ABNORMAL LOW (ref 8.9–10.3)
Chloride: 109 mmol/L (ref 101–111)
Creatinine, Ser: 1.78 mg/dL — ABNORMAL HIGH (ref 0.61–1.24)
GFR calc Af Amer: 46 mL/min — ABNORMAL LOW (ref >60)
GFR calc non Af Amer: 40 mL/min — ABNORMAL LOW (ref >60)
Glucose, Bld: 74 mg/dL (ref 65–99)
Potassium: 4.6 mmol/L (ref 3.5–5.1)
Sodium: 140 mmol/L (ref 135–145)
Total Bilirubin: 0.4 mg/dL (ref 0.3–1.2)
Total Protein: 6.6 g/dL (ref 6.5–8.1)

## 2018-05-07 LAB — RETICULOCYTES
RBC.: 3.64 MIL/uL — AB (ref 4.20–5.82)
RETIC COUNT ABSOLUTE: 51 10*3/uL (ref 34.8–93.9)
RETIC CT PCT: 1.4 % (ref 0.8–1.8)

## 2018-05-07 MED ORDER — DENOSUMAB 120 MG/1.7ML ~~LOC~~ SOLN
120.0000 mg | Freq: Once | SUBCUTANEOUS | Status: AC
  Administered 2018-05-07: 120 mg via SUBCUTANEOUS

## 2018-05-07 MED ORDER — DENOSUMAB 120 MG/1.7ML ~~LOC~~ SOLN
SUBCUTANEOUS | Status: AC
Start: 2018-05-07 — End: ?
  Filled 2018-05-07: qty 1.7

## 2018-05-07 MED ORDER — ONDANSETRON HCL 8 MG PO TABS
4.0000 mg | ORAL_TABLET | Freq: Once | ORAL | Status: AC
  Administered 2018-05-07: 4 mg via ORAL

## 2018-05-07 MED ORDER — BORTEZOMIB CHEMO SQ INJECTION 3.5 MG (2.5MG/ML)
1.3000 mg/m2 | Freq: Once | INTRAMUSCULAR | Status: AC
  Administered 2018-05-07: 2.25 mg via SUBCUTANEOUS
  Filled 2018-05-07: qty 2.25

## 2018-05-07 MED ORDER — ONDANSETRON HCL 8 MG PO TABS
ORAL_TABLET | ORAL | Status: AC
  Filled 2018-05-07: qty 1

## 2018-05-07 MED ORDER — GLECAPREVIR-PIBRENTASVIR 100-40 MG PO TABS: 3.0000 | ORAL_TABLET | Freq: Every day | ORAL | 1 refills | Status: DC

## 2018-05-07 NOTE — Telephone Encounter (Signed)
Faxed medical records to [employer] from [place] @ [phone]. Release ID# [id number]

## 2018-05-07 NOTE — Progress Notes (Signed)
Per Dr Irene Limbo ok to Tx today with labs from 05/03/2018.  Ok to give [place] today with calcium level 3.8 today.

## 2018-05-07 NOTE — Telephone Encounter (Signed)
Per message from Dr. Irene Limbo, called [facility] and spoke with Amy to confirm pt referral to Dr. Norma Fredrickson for transplant consideration. Pt was a "no show" for appointment on 4/3. Called pt to discuss. Pt has not f/u on appt with [facility] d/t Hepatitis C plan. Awaiting discussion with MD tomorrow regarding treatment r/t Hep C and then to proceed in rescheduling appointment with [facility]. Number provided for pt [phone]. Pt plans to call this RN back to notify of plans.

## 2018-05-07 NOTE — Patient Instructions (Addendum)
[facility] Discharge Instructions for Patients Receiving Chemotherapy  Today you received the following chemotherapy agents:  Velcade (bortezomib).  To help prevent nausea and vomiting after your treatment, we encourage you to take your nausea medication as prescribed.   If you develop nausea and vomiting that is not controlled by your nausea medication, call the clinic.   BELOW ARE SYMPTOMS THAT SHOULD BE REPORTED IMMEDIATELY:  *FEVER GREATER THAN 100.5 F  *CHILLS WITH OR WITHOUT FEVER  NAUSEA AND VOMITING THAT IS NOT CONTROLLED WITH YOUR NAUSEA MEDICATION  *UNUSUAL SHORTNESS OF BREATH  *UNUSUAL BRUISING OR BLEEDING  TENDERNESS IN MOUTH AND THROAT WITH OR WITHOUT PRESENCE OF ULCERS  *URINARY PROBLEMS  *BOWEL PROBLEMS  UNUSUAL RASH Items with * indicate a potential emergency and should be followed up as soon as possible.  Feel free to call the clinic should you have any questions or concerns. The clinic phone number is [phone].  Please show the [facility] at check-in to the Emergency Department and triage nurse.  Denosumab injection Delton See) What is this medicine? DENOSUMAB (den oh sue mab) slows bone breakdown. Prolia is used to treat osteoporosis in women after menopause and in men. Delton See is used to treat a high calcium level due to cancer and to prevent bone fractures and other bone problems caused by multiple myeloma or cancer bone metastases. Delton See is also used to treat giant cell tumor of the bone. This medicine may be used for other purposes; ask your health care provider or pharmacist if you have questions. COMMON BRAND NAME(S): Prolia, XGEVA What should I tell my health care provider before I take this medicine? They need to know if you have any of these conditions: -dental disease -having surgery or tooth extraction -infection -kidney disease -low levels of calcium or Vitamin D in the blood -malnutrition -on hemodialysis -skin  conditions or sensitivity -thyroid or parathyroid disease -an unusual reaction to denosumab, other medicines, foods, dyes, or preservatives -pregnant or trying to get pregnant -breast-feeding How should I use this medicine? This medicine is for injection under the skin. It is given by a health care professional in a hospital or clinic setting. If you are getting Prolia, a special MedGuide will be given to you by the pharmacist with each prescription and refill. Be sure to read this information carefully each time. For Prolia, talk to your pediatrician regarding the use of this medicine in children. Special care may be needed. For Delton See, talk to your pediatrician regarding the use of this medicine in children. While this drug may be prescribed for children as young as 13 years for selected conditions, precautions do apply. Overdosage: If you think you have taken too much of this medicine contact a poison control center or emergency room at once. NOTE: This medicine is only for you. Do not share this medicine with others. What if I miss a dose? It is important not to miss your dose. Call your doctor or health care professional if you are unable to keep an appointment. What may interact with this medicine? Do not take this medicine with any of the following medications: -other medicines containing denosumab This medicine may also interact with the following medications: -medicines that lower your chance of fighting infection -steroid medicines like prednisone or cortisone This list may not describe all possible interactions. Give your health care provider a list of all the medicines, herbs, non-prescription drugs, or dietary supplements you use. Also tell them if you smoke, drink alcohol,  or use illegal drugs. Some items may interact with your medicine. What should I watch for while using this medicine? Visit your doctor or health care professional for regular checks on your progress. Your doctor  or health care professional may order blood tests and other tests to see how you are doing. Call your doctor or health care professional for advice if you get a fever, chills or sore throat, or other symptoms of a cold or flu. Do not treat yourself. This drug may decrease your body's ability to fight infection. Try to avoid being around people who are sick. You should make sure you get enough calcium and vitamin D while you are taking this medicine, unless your doctor tells you not to. Discuss the foods you eat and the vitamins you take with your health care professional. See your dentist regularly. Brush and floss your teeth as directed. Before you have any dental work done, tell your dentist you are receiving this medicine. Do not become pregnant while taking this medicine or for 5 months after stopping it. Talk with your doctor or health care professional about your birth control options while taking this medicine. Women should inform their doctor if they wish to become pregnant or think they might be pregnant. There is a potential for serious side effects to an unborn child. Talk to your health care professional or pharmacist for more information. What side effects may I notice from receiving this medicine? Side effects that you should report to your doctor or health care professional as soon as possible: -allergic reactions like skin rash, itching or hives, swelling of the face, lips, or tongue -bone pain -breathing problems -dizziness -jaw pain, especially after dental work -redness, blistering, peeling of the skin -signs and symptoms of infection like fever or chills; cough; sore throat; pain or trouble passing urine -signs of low calcium like fast heartbeat, muscle cramps or muscle pain; pain, tingling, numbness in the hands or feet; seizures -unusual bleeding or bruising -unusually weak or tired Side effects that usually do not require medical attention (report to your doctor or health care  professional if they continue or are bothersome): -constipation -diarrhea -headache -joint pain -loss of appetite -muscle pain -runny nose -tiredness -upset stomach This list may not describe all possible side effects. Call your doctor for medical advice about side effects. You may report side effects to FDA at 1-800-FDA-1088. Where should I keep my medicine? This medicine is only given in a clinic, doctor's office, or other health care setting and will not be stored at home. NOTE: This sheet is a summary. It may not cover all possible information. If you have questions about this medicine, talk to your doctor, pharmacist, or health care provider.  2018 Elsevier/Gold Standard (2016-12-19 19:17:21)

## 2018-05-07 NOTE — Progress Notes (Signed)
Ca 8.3, Alb 3.8, Ok to give xgeva today per MD.  Hardie Pulley, PharmD, BCPS, BCOP

## 2018-05-10 ENCOUNTER — Ambulatory Visit: Payer: BLUE CROSS/BLUE SHIELD

## 2018-05-10 ENCOUNTER — Other Ambulatory Visit: Payer: Self-pay | Admitting: Hematology

## 2018-05-12 ENCOUNTER — Other Ambulatory Visit: Payer: Self-pay | Admitting: Hematology

## 2018-05-17 ENCOUNTER — Inpatient Hospital Stay: Payer: BLUE CROSS/BLUE SHIELD | Attending: Hematology

## 2018-05-17 ENCOUNTER — Inpatient Hospital Stay: Payer: BLUE CROSS/BLUE SHIELD

## 2018-05-17 ENCOUNTER — Other Ambulatory Visit: Payer: BLUE CROSS/BLUE SHIELD

## 2018-05-17 VITALS — BP 135/90 | HR 84 | Temp 98.2°F | Resp 18

## 2018-05-17 DIAGNOSIS — R5383 Other fatigue: Secondary | ICD-10-CM | POA: Diagnosis not present

## 2018-05-17 DIAGNOSIS — Z79899 Other long term (current) drug therapy: Secondary | ICD-10-CM | POA: Diagnosis not present

## 2018-05-17 DIAGNOSIS — N400 Enlarged prostate without lower urinary tract symptoms: Secondary | ICD-10-CM | POA: Diagnosis not present

## 2018-05-17 DIAGNOSIS — Z5111 Encounter for antineoplastic chemotherapy: Secondary | ICD-10-CM | POA: Diagnosis not present

## 2018-05-17 DIAGNOSIS — B1920 Unspecified viral hepatitis C without hepatic coma: Secondary | ICD-10-CM | POA: Diagnosis not present

## 2018-05-17 DIAGNOSIS — R42 Dizziness and giddiness: Secondary | ICD-10-CM | POA: Insufficient documentation

## 2018-05-17 DIAGNOSIS — C9010 Plasma cell leukemia not having achieved remission: Secondary | ICD-10-CM | POA: Insufficient documentation

## 2018-05-17 DIAGNOSIS — Z7189 Other specified counseling: Secondary | ICD-10-CM

## 2018-05-17 DIAGNOSIS — D638 Anemia in other chronic diseases classified elsewhere: Secondary | ICD-10-CM | POA: Diagnosis not present

## 2018-05-17 DIAGNOSIS — N289 Disorder of kidney and ureter, unspecified: Secondary | ICD-10-CM | POA: Diagnosis not present

## 2018-05-17 DIAGNOSIS — G629 Polyneuropathy, unspecified: Secondary | ICD-10-CM | POA: Insufficient documentation

## 2018-05-17 DIAGNOSIS — I10 Essential (primary) hypertension: Secondary | ICD-10-CM | POA: Insufficient documentation

## 2018-05-17 DIAGNOSIS — F17210 Nicotine dependence, cigarettes, uncomplicated: Secondary | ICD-10-CM | POA: Diagnosis not present

## 2018-05-17 DIAGNOSIS — C9000 Multiple myeloma not having achieved remission: Secondary | ICD-10-CM

## 2018-05-17 LAB — CBC WITH DIFFERENTIAL/PLATELET
BASOS ABS: 0 10*3/uL (ref 0.0–0.1)
BASOS PCT: 1 %
EOS ABS: 0.1 10*3/uL (ref 0.0–0.5)
Eosinophils Relative: 2 %
HCT: 31.9 % — ABNORMAL LOW (ref 38.4–49.9)
HEMOGLOBIN: 11.1 g/dL — AB (ref 13.0–17.1)
Lymphocytes Relative: 16 %
Lymphs Abs: 0.9 10*3/uL (ref 0.9–3.3)
MCH: 31.3 pg (ref 27.2–33.4)
MCHC: 34.8 g/dL (ref 32.0–36.0)
MCV: 89.9 fL (ref 79.3–98.0)
MONOS PCT: 15 %
Monocytes Absolute: 0.9 10*3/uL (ref 0.1–0.9)
NEUTROS PCT: 66 %
Neutro Abs: 3.9 10*3/uL (ref 1.5–6.5)
Platelets: 274 10*3/uL (ref 140–400)
RBC: 3.55 MIL/uL — AB (ref 4.20–5.82)
RDW: 14.8 % — ABNORMAL HIGH (ref 11.0–14.6)
WBC: 5.8 10*3/uL (ref 4.0–10.3)

## 2018-05-17 LAB — CMP (CANCER CENTER ONLY)
ALK PHOS: 57 U/L (ref 40–150)
ALT: 21 U/L (ref 0–55)
AST: 21 U/L (ref 5–34)
Albumin: 3.8 g/dL (ref 3.5–5.0)
Anion gap: 10 (ref 3–11)
BILIRUBIN TOTAL: 0.4 mg/dL (ref 0.2–1.2)
BUN: 24 mg/dL (ref 7–26)
CALCIUM: 8.9 mg/dL (ref 8.4–10.4)
CO2: 21 mmol/L — ABNORMAL LOW (ref 22–29)
CREATININE: 1.99 mg/dL — AB (ref 0.70–1.30)
Chloride: 111 mmol/L — ABNORMAL HIGH (ref 98–109)
GFR, EST AFRICAN AMERICAN: 40 mL/min — AB (ref >60)
GFR, EST NON AFRICAN AMERICAN: 35 mL/min — AB (ref >60)
Glucose, Bld: 76 mg/dL (ref 70–140)
Potassium: 4.7 mmol/L (ref 3.5–5.1)
Sodium: 142 mmol/L (ref 136–145)
TOTAL PROTEIN: 6.6 g/dL (ref 6.4–8.3)

## 2018-05-17 LAB — RETICULOCYTES
RBC.: 3.55 MIL/uL — AB (ref 4.20–5.82)
RETIC COUNT ABSOLUTE: 39.1 10*3/uL (ref 34.8–93.9)
RETIC CT PCT: 1.1 % (ref 0.8–1.8)

## 2018-05-17 MED ORDER — ONDANSETRON HCL 8 MG PO TABS
4.0000 mg | ORAL_TABLET | Freq: Once | ORAL | Status: AC
  Administered 2018-05-17: 4 mg via ORAL

## 2018-05-17 MED ORDER — BORTEZOMIB CHEMO SQ INJECTION 3.5 MG (2.5MG/ML)
1.3000 mg/m2 | Freq: Once | INTRAMUSCULAR | Status: AC
  Administered 2018-05-17: 2.25 mg via SUBCUTANEOUS
  Filled 2018-05-17: qty 0.9

## 2018-05-17 MED ORDER — ONDANSETRON HCL 8 MG PO TABS
ORAL_TABLET | ORAL | Status: AC
  Filled 2018-05-17: qty 1

## 2018-05-17 NOTE — Patient Instructions (Signed)
[facility] Discharge Instructions for Patients Receiving Chemotherapy  Today you received the following chemotherapy agents:  Velcade (bortezomib).  To help prevent nausea and vomiting after your treatment, we encourage you to take your nausea medication as prescribed.   If you develop nausea and vomiting that is not controlled by your nausea medication, call the clinic.   BELOW ARE SYMPTOMS THAT SHOULD BE REPORTED IMMEDIATELY:  *FEVER GREATER THAN 100.5 F  *CHILLS WITH OR WITHOUT FEVER  NAUSEA AND VOMITING THAT IS NOT CONTROLLED WITH YOUR NAUSEA MEDICATION  *UNUSUAL SHORTNESS OF BREATH  *UNUSUAL BRUISING OR BLEEDING  TENDERNESS IN MOUTH AND THROAT WITH OR WITHOUT PRESENCE OF ULCERS  *URINARY PROBLEMS  *BOWEL PROBLEMS  UNUSUAL RASH Items with * indicate a potential emergency and should be followed up as soon as possible.  Feel free to call the clinic should you have any questions or concerns. The clinic phone number is [phone].  Please show the [facility] at check-in to the Emergency Department and triage nurse.  Denosumab injection Delton See) What is this medicine? DENOSUMAB (den oh sue mab) slows bone breakdown. Prolia is used to treat osteoporosis in women after menopause and in men. Delton See is used to treat a high calcium level due to cancer and to prevent bone fractures and other bone problems caused by multiple myeloma or cancer bone metastases. Delton See is also used to treat giant cell tumor of the bone. This medicine may be used for other purposes; ask your health care provider or pharmacist if you have questions. COMMON BRAND NAME(S): Prolia, XGEVA What should I tell my health care provider before I take this medicine? They need to know if you have any of these conditions: -dental disease -having surgery or tooth extraction -infection -kidney disease -low levels of calcium or Vitamin D in the blood -malnutrition -on hemodialysis -skin  conditions or sensitivity -thyroid or parathyroid disease -an unusual reaction to denosumab, other medicines, foods, dyes, or preservatives -pregnant or trying to get pregnant -breast-feeding How should I use this medicine? This medicine is for injection under the skin. It is given by a health care professional in a hospital or clinic setting. If you are getting Prolia, a special MedGuide will be given to you by the pharmacist with each prescription and refill. Be sure to read this information carefully each time. For Prolia, talk to your pediatrician regarding the use of this medicine in children. Special care may be needed. For Delton See, talk to your pediatrician regarding the use of this medicine in children. While this drug may be prescribed for children as young as 13 years for selected conditions, precautions do apply. Overdosage: If you think you have taken too much of this medicine contact a poison control center or emergency room at once. NOTE: This medicine is only for you. Do not share this medicine with others. What if I miss a dose? It is important not to miss your dose. Call your doctor or health care professional if you are unable to keep an appointment. What may interact with this medicine? Do not take this medicine with any of the following medications: -other medicines containing denosumab This medicine may also interact with the following medications: -medicines that lower your chance of fighting infection -steroid medicines like prednisone or cortisone This list may not describe all possible interactions. Give your health care provider a list of all the medicines, herbs, non-prescription drugs, or dietary supplements you use. Also tell them if you smoke, drink alcohol,  or use illegal drugs. Some items may interact with your medicine. What should I watch for while using this medicine? Visit your doctor or health care professional for regular checks on your progress. Your doctor  or health care professional may order blood tests and other tests to see how you are doing. Call your doctor or health care professional for advice if you get a fever, chills or sore throat, or other symptoms of a cold or flu. Do not treat yourself. This drug may decrease your body's ability to fight infection. Try to avoid being around people who are sick. You should make sure you get enough calcium and vitamin D while you are taking this medicine, unless your doctor tells you not to. Discuss the foods you eat and the vitamins you take with your health care professional. See your dentist regularly. Brush and floss your teeth as directed. Before you have any dental work done, tell your dentist you are receiving this medicine. Do not become pregnant while taking this medicine or for 5 months after stopping it. Talk with your doctor or health care professional about your birth control options while taking this medicine. Women should inform their doctor if they wish to become pregnant or think they might be pregnant. There is a potential for serious side effects to an unborn child. Talk to your health care professional or pharmacist for more information. What side effects may I notice from receiving this medicine? Side effects that you should report to your doctor or health care professional as soon as possible: -allergic reactions like skin rash, itching or hives, swelling of the face, lips, or tongue -bone pain -breathing problems -dizziness -jaw pain, especially after dental work -redness, blistering, peeling of the skin -signs and symptoms of infection like fever or chills; cough; sore throat; pain or trouble passing urine -signs of low calcium like fast heartbeat, muscle cramps or muscle pain; pain, tingling, numbness in the hands or feet; seizures -unusual bleeding or bruising -unusually weak or tired Side effects that usually do not require medical attention (report to your doctor or health care  professional if they continue or are bothersome): -constipation -diarrhea -headache -joint pain -loss of appetite -muscle pain -runny nose -tiredness -upset stomach This list may not describe all possible side effects. Call your doctor for medical advice about side effects. You may report side effects to FDA at 1-800-FDA-1088. Where should I keep my medicine? This medicine is only given in a clinic, doctor's office, or other health care setting and will not be stored at home. NOTE: This sheet is a summary. It may not cover all possible information. If you have questions about this medicine, talk to your doctor, pharmacist, or health care provider.  2018 [employer]vier/Gold Standard (2016-12-19 19:17:21)

## 2018-05-17 NOTE — Progress Notes (Signed)
Okay to tx with creatinine 1.99, per desk RN Kristen who spoke with Dr. Irene Limbo.

## 2018-05-17 NOTE — Progress Notes (Signed)
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 His labs today 12/14/2017 show hgb 8.3, platelets 524k. He is doing well overall. He has staples in his right upper leg following a recent surgery and has not been in touch with the surgeons for a f/u of this. He is not receiving physical therapy and is not getting around at home.  He notes that he was not scheduled for a post hospitalization nephrology f/u and has no PCP.  On review of systems, pt reports intermittent fatigue, SOB, pain to the left leg, dizziness, dry mouth and denies fever, chills, night sweats, dysuria and any other accompanying symptoms.   INTERVAL HISTORY:   Andres Fernandez presents today for follow up and C8D4 of treatment of his myeloma/plasma cell leukemia. The patient's last visit with [name] was on 04/29/18. The pt reports that he is doing well overall.   The pt reports that he recently saw Dr Linus Salmons and began Maverick on 05/17/18 and will be treated with this for 8 weeks and is tolerating it well generally. He has also reconnected with his PCP Dr Christa See and will see him on June 19. He notes that his peripheral neuropathy is stable and hasn't worsened, and he will continue to monitor this while on Velcade.   Lab results today (05/17/18) of CBC, CMP, and Reticulocytes is as follows: all values are WNL except for RBC at 3.55, HGB at 11.1, HCT at 31.9, RDW at 14.8, Chloride at 111, CO2 at 21, Creatinine at 1.99. MMP 05/20/18 shows no M spike Kappa/Lambda 05/20/18 conitnues to improve  On review of systems, pt reports stable peripheral neuropathy, and denies fatigue, nausea, abdominal pains, leg swelling, and any other symptoms.    REVIEW OF SYSTEMS:   A 10+ POINT REVIEW OF SYSTEMS  WAS OBTAINED including neurology, dermatology, psychiatry, cardiac, respiratory, lymph, extremities, GI, GU, Musculoskeletal, constitutional, breasts, reproductive, HEENT.  All pertinent positives are noted in the HPI.  All others are negative.    MEDICAL HISTORY:  Past Medical History:  Diagnosis Date  . BPH (benign prostatic hyperplasia)   . Hepatitis C 11/27/2017  . Hypertension   . Plasma cell leukemia ([place]) 11/23/2017  . Tobacco abuse     SURGICAL HISTORY: Past Surgical History:  Procedure Laterality Date  . APPENDECTOMY    . FEMUR IM NAIL Right 11/20/2017   Procedure: RIGHT FEMORAL INTRAMEDULLARY (IM) NAIL;  Surgeon: Nicholes Stairs, MD;  Location: [facility];  Service: Orthopedics;  Laterality: Right;    SOCIAL HISTORY: Social History   Socioeconomic History  . Marital status: Single    Spouse name: Not on file  . Number of children: Not on file  . Years of education: Not on file  . Highest education level: Not on file  Occupational History  . Not on file  Social Needs  . Financial resource strain: Not on file  . Food insecurity:    Worry: Not on file    Inability: Not on file  . Transportation needs:    Medical: Not on file    Non-medical: Not on file  Tobacco Use  . Smoking status: Current Every Day Smoker    Packs/day: 0.25    Types: Cigarettes    Last attempt to quit: 11/09/2017  Years since quitting: 0.5  . Smokeless tobacco: Never Used  Substance and Sexual Activity  . Alcohol use: Yes  . Drug use: No  . Sexual activity: Not on file  Lifestyle  . Physical activity:    Days per week: Not on file    Minutes per session: Not on file  . Stress: Not on file  Relationships  . Social connections:    Talks on phone: Not on file    Gets together: Not on file    Attends religious service: Not on file    Active member of club or organization: Not on file    Attends meetings of clubs or organizations: Not on file    Relationship status: Not on file    . Intimate partner violence:    Fear of current or ex partner: Not on file    Emotionally abused: Not on file    Physically abused: Not on file    Forced sexual activity: Not on file  Other Topics Concern  . Not on file  Social History Narrative  . Not on file    FAMILY HISTORY: Family History  Problem Relation Age of Onset  . COPD Mother   . Liver cancer Mother     ALLERGIES:  has No Known Allergies.  MEDICATIONS:  . Current Outpatient Medications on File Prior to Visit  Medication Sig Dispense Refill  . acyclovir (ZOVIRAX) 400 MG tablet Take 1 tablet (400 mg total) by mouth daily. 30 tablet 6  . cholecalciferol (VITAMIN D) 400 units TABS tablet Take 400 Units by mouth daily.    . cyclophosphamide (CYTOXAN) 50 MG capsule Take 11 caps (547m) by mouth once weekly on days 1, 8, &15, every 21 days. Take with breakfast, maintain hydration. 33 capsule 0  . dexamethasone (DECADRON) 4 MG tablet Take 5 tablets (20 mg total) by mouth once a week. Every Saturday morning 20 tablet 1  . Glecaprevir-Pibrentasvir (MAVYRET) 100-40 MG TABS Take 3 tablets by mouth daily with supper. 84 tablet 1  . NICOTINE STEP 2 14 MG/24HR patch PLACE 1 PATCH (14 MG TOTAL) ONTO THE SKIN DAILY. 28 patch 1  . ondansetron (ZOFRAN) 4 MG tablet Take 2 tablets (8 mg total) by mouth every 8 (eight) hours as needed for nausea. 30 tablet 0  . traZODone (DESYREL) 50 MG tablet TAKE 1 TABLET BY MOUTH EVERYDAY AT BEDTIME 30 tablet 0   No current facility-administered medications on file prior to visit.     PHYSICAL EXAMINATION:  VS reviewed Vitals:   05/20/18 0918  BP: (!) 150/85  Pulse: 93  Resp: 18  Temp: 98.2 F (36.8 C)  SpO2: 100%     GENERAL:alert, in no acute distress and comfortable SKIN: no acute rashes, no significant lesions EYES: conjunctiva are pink and non-injected, sclera anicteric OROPHARYNX: MMM, no exudates, no oropharyngeal erythema or ulceration NECK: supple, no JVD LYMPH:  no palpable  lymphadenopathy in the cervical, axillary or inguinal regions LUNGS: clear to auscultation b/l with normal respiratory effort HEART: regular rate & rhythm ABDOMEN:  normoactive bowel sounds , non tender, not distended. Extremity: no pedal edema PSYCH: alert & oriented x 3 with fluent speech NEURO: no focal motor/sensory deficits   LABORATORY DATA:  I have reviewed the data as listed  . CBC Latest Ref Rng & Units 05/20/2018 05/17/2018 05/07/2018  WBC 4.0 - 10.3 K/uL 5.3 5.8 4.3  Hemoglobin 13.0 - 17.1 g/dL 10.8(L) 11.1(L) 11.4(L)  Hematocrit 38.4 - 49.9 % 31.2(L) 31.9(L) 33.6(L)  Platelets 140 - 400 K/uL 247 274 246   . CBC    Component Value Date/Time   WBC 5.3 05/20/2018 0952   RBC 3.46 (L) 05/20/2018 0952   RBC 3.46 (L) 05/20/2018 0952   HGB 10.8 (L) 05/20/2018 0952   HGB 11.4 (L) 05/07/2018 0856   HGB 8.3 (L) 12/14/2017 1230   HCT 31.2 (L) 05/20/2018 0952   HCT 23.9 (L) 12/14/2017 1230   PLT 247 05/20/2018 0952   PLT 246 05/07/2018 0856   PLT 524 (H) 12/14/2017 1230   MCV 90.2 05/20/2018 0952   MCV 88.4 12/14/2017 1230   MCH 31.2 05/20/2018 0952   MCHC 34.6 05/20/2018 0952   RDW 15.2 (H) 05/20/2018 0952   RDW 15.3 (H) 12/14/2017 1230   LYMPHSABS 1.3 05/20/2018 0952   LYMPHSABS 1.4 12/14/2017 1230   MONOABS 0.4 05/20/2018 0952   MONOABS 0.6 12/14/2017 1230   EOSABS 0.1 05/20/2018 0952   EOSABS 0.1 12/14/2017 1230   BASOSABS 0.0 05/20/2018 0952   BASOSABS 0.1 12/14/2017 1230     . CMP Latest Ref Rng & Units 05/17/2018 05/07/2018 05/03/2018  Glucose 70 - 140 mg/dL 76 74 78  BUN 7 - 26 mg/dL 24 28(H) 25  Creatinine 0.70 - 1.30 mg/dL 1.99(H) 1.78(H) 1.99(H)  Sodium 136 - 145 mmol/L 142 140 142  Potassium 3.5 - 5.1 mmol/L 4.7 4.6 5.2(H)  Chloride 98 - 109 mmol/L 111(H) 109 111(H)  CO2 22 - 29 mmol/L 21(L) 22 22  Calcium 8.4 - 10.4 mg/dL 8.9 8.3(L) 7.8(L)  Total Protein 6.4 - 8.3 g/dL 6.6 6.6 6.6  Total Bilirubin 0.2 - 1.2 mg/dL 0.4 0.4 0.3  Alkaline Phos 40 - 150  U/L 57 51 64  AST 5 - 34 U/L 21 34 43(H)  ALT 0 - 55 U/L 21 41 46   Component     Latest Ref Rng & Units 11/17/2017 11/23/2017 12/01/2017 01/11/2018  Kappa free light chain     3.3 - 19.4 mg/L 27,634.1 (H) 13,734.8 (H) 6,931.2 (H) 437.3 (H)  Lamda free light chains     5.7 - 26.3 mg/L 5.8 2.3 (L) 9.8 20.9  Kappa, lamda light chain ratio     0.26 - 1.65 4,764.50 (H) 5,971.65 (H) 707.27 (H) 20.92 (H)   Component     Latest Ref Rng & Units 02/22/2018 02/25/2018 03/15/2018 04/05/2018  Kappa free light chain     3.3 - 19.4 mg/L 182.7 (H) 132.1 (H) 130.9 (H) 121.5 (H)  Lamda free light chains     5.7 - 26.3 mg/L 12.9 12.7 12.1 12.9  Kappa, lamda light chain ratio     0.26 - 1.65 14.16 (H) 10.40 (H) 10.82 (H) 9.42 (H)           RADIOGRAPHIC STUDIES: I have personally reviewed the radiological images as listed and agreed with the findings in the report. US Abdomen Complete W/elastography  Result Date: 04/26/2018 CLINICAL DATA:  Chronic hepatitis-C without hepatic coma, history hypertension, smoking, plasma cell leukemia EXAM: ULTRASOUND ABDOMEN ULTRASOUND HEPATIC ELASTOGRAPHY TECHNIQUE: Sonography of the upper abdomen was performed. In addition, ultrasound elastography evaluation of the liver was performed. A region of interest was placed within the right lobe of the liver. Following application of a compressive sonographic pulse, shear waves were detected in the adjacent hepatic tissue and the shear wave velocity was calculated. Multiple assessments were performed at the selected site. Median shear wave velocity is correlated to a Metavir fibrosis score. COMPARISON:  11/29/2017; correlation CT abdomen  and pelvis 11/25/2017 FINDINGS: ULTRASOUND ABDOMEN Gallbladder: Normally distended without stones or wall thickening. No pericholecystic fluid or sonographic Murphy sign. Common bile duct: Diameter: 4 mm, normal Liver: Normal appearance. No focal hepatic mass or nodularity. Normal appearance IVC:  Normal appearance Pancreas: Normal appearance, 4.6 cm length Spleen: 10.1 cm Right Kidney: Length: 11.5 cm. Upper normal cortical echogenicity. No definite mass or hydronephrosis. Left Kidney: Length: 11.5 cm. Upper normal cortical echogenicity. No definite mass or hydronephrosis. Abdominal aorta: Normal caliber Other findings: No free fluid ULTRASOUND HEPATIC ELASTOGRAPHY Device: Siemens Helix VTQ Patient position: Left Lateral Decubitus Transducer 5C Number of measurements: 10 Hepatic segment:  8 Median velocity:   1.21 m/sec IQR: 0.11 IQR/Median velocity ratio: 0.09 Corresponding Metavir fibrosis score:  F2 + some F3 Risk of fibrosis: Moderate Limitations of exam: None Please note that abnormal shear wave velocities may also be identified in clinical settings other than with hepatic fibrosis, such as: acute hepatitis, elevated right heart and central venous pressures including use of beta blockers, veno-occlusive disease (Budd-Chiari), infiltrative processes such as mastocytosis/amyloidosis/infiltrative tumor, extrahepatic cholestasis, in the post-prandial state, and liver transplantation. Correlation with patient history, laboratory data, and clinical condition recommended. IMPRESSION: ULTRASOUND ABDOMEN: Normal upper abdominal ultrasound ULTRASOUND HEPATIC ELASTOGRAPHY: Median hepatic shear wave velocity is calculated at 1.21 m/sec. Corresponding Metavir fibrosis score is F2 + some F3. Risk of fibrosis is Moderate. Follow-up: Additional testing appropriate Electronically Signed   By: Lavonia Dana M.D.   On: 04/26/2018 10:15    ASSESSMENT & PLAN:   62 y.o.A male presenting with severe hypercalcemia, skeletal findings consistent with myeloma and significant leukocytosis/lymphocytosis in the peripheral blood as well as AKI and hypercalcemia.  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 -Hypercalcemia -Renal Failure -Anemia -Extensive Bone lesions -Appears to be primarily  Kappa Light chain Myeloma with no overt M spike. Appears to be responding to treatment.- with decreased kappa light chains.   BM Bx showed >95% involvement with kappa restricted serum free light chains. 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 MoL Cy-    PLAN  -Discussed pt labwork today, 05/20/18; blood counts and chemistries are stable -Pt has begun treatment with maverick for hep c with Dr Linus Salmons for 8 weeks -We will continue with CyBorD until myeloma and kappa/lambda free light chains cease to respond -Will continue to monitor peripheral neuropathy while pt takes Velcade -Continue Xgeva every 4 weeks, Velcade  -Follow up with PCP Dr Christa See  -Will see pt back in 3-4 weeks with discussion of treatment goals moving forward  -continue Acyclovir prophylaxis. -weekly labs on D1 AND D8 each cycle  2) s/p Prophylactic IM nailing for rt femur. -Advised that we will re-evaluate the patient returning to work by March on the next visit following [facility] the [facility].  -patient reports improvement and is currently progressively back to full time work.  3) Thrombocytopenia resolved  4)Renal insufficiency -- likely primarily related to Myeloma -Kidney numbers improved. 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  7) Renal insuff- due to myeloma.  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 Plan -Hgb at 10.8 and  stable   Please schedule next 2 cycles of CyBorD treatment RTC with Dr Irene Limbo in 3 weeks  Labs D1 and D8 each  cycle Continue Delton See q4weeks (schedule on one of his Velcade treatment days to avoid extra visit --lives in [place])   The toal time spent in the appt was 20 minutes and more than 50% was on counseling and direct patient cares.   Sullivan Lone MD [place] [facility] Hematology/Oncology Physician [facility]  (Office):       [phone] (Work cell):  [phone] (Fax):           [phone]  I, Baldwin Jamaica, am acting as a scribe for Dr Irene Limbo.   .I have reviewed the above documentation for accuracy and completeness, and I agree with the above. Brunetta Genera MD

## 2018-05-20 ENCOUNTER — Inpatient Hospital Stay: Payer: BLUE CROSS/BLUE SHIELD

## 2018-05-20 ENCOUNTER — Other Ambulatory Visit: Payer: Self-pay

## 2018-05-20 ENCOUNTER — Inpatient Hospital Stay (HOSPITAL_BASED_OUTPATIENT_CLINIC_OR_DEPARTMENT_OTHER): Payer: BLUE CROSS/BLUE SHIELD | Admitting: Hematology

## 2018-05-20 ENCOUNTER — Telehealth: Payer: Self-pay | Admitting: Hematology

## 2018-05-20 ENCOUNTER — Other Ambulatory Visit: Payer: BLUE CROSS/BLUE SHIELD

## 2018-05-20 VITALS — BP 150/85 | HR 93 | Temp 98.2°F | Resp 18 | Ht 68.0 in | Wt 162.8 lb

## 2018-05-20 DIAGNOSIS — D638 Anemia in other chronic diseases classified elsewhere: Secondary | ICD-10-CM | POA: Diagnosis not present

## 2018-05-20 DIAGNOSIS — R42 Dizziness and giddiness: Secondary | ICD-10-CM

## 2018-05-20 DIAGNOSIS — N400 Enlarged prostate without lower urinary tract symptoms: Secondary | ICD-10-CM

## 2018-05-20 DIAGNOSIS — Z7189 Other specified counseling: Secondary | ICD-10-CM

## 2018-05-20 DIAGNOSIS — B1920 Unspecified viral hepatitis C without hepatic coma: Secondary | ICD-10-CM

## 2018-05-20 DIAGNOSIS — I10 Essential (primary) hypertension: Secondary | ICD-10-CM | POA: Diagnosis not present

## 2018-05-20 DIAGNOSIS — C9010 Plasma cell leukemia not having achieved remission: Secondary | ICD-10-CM | POA: Diagnosis not present

## 2018-05-20 DIAGNOSIS — R5383 Other fatigue: Secondary | ICD-10-CM | POA: Diagnosis not present

## 2018-05-20 DIAGNOSIS — C9000 Multiple myeloma not having achieved remission: Secondary | ICD-10-CM

## 2018-05-20 DIAGNOSIS — G629 Polyneuropathy, unspecified: Secondary | ICD-10-CM | POA: Diagnosis not present

## 2018-05-20 DIAGNOSIS — N289 Disorder of kidney and ureter, unspecified: Secondary | ICD-10-CM | POA: Diagnosis not present

## 2018-05-20 DIAGNOSIS — F17210 Nicotine dependence, cigarettes, uncomplicated: Secondary | ICD-10-CM | POA: Diagnosis not present

## 2018-05-20 DIAGNOSIS — Z79899 Other long term (current) drug therapy: Secondary | ICD-10-CM | POA: Diagnosis not present

## 2018-05-20 LAB — CBC WITH DIFFERENTIAL/PLATELET
BASOS ABS: 0 10*3/uL (ref 0.0–0.1)
Basophils Relative: 1 %
EOS ABS: 0.1 10*3/uL (ref 0.0–0.5)
EOS PCT: 1 %
HCT: 31.2 % — ABNORMAL LOW (ref 38.4–49.9)
Hemoglobin: 10.8 g/dL — ABNORMAL LOW (ref 13.0–17.1)
LYMPHS ABS: 1.3 10*3/uL (ref 0.9–3.3)
Lymphocytes Relative: 24 %
MCH: 31.2 pg (ref 27.2–33.4)
MCHC: 34.6 g/dL (ref 32.0–36.0)
MCV: 90.2 fL (ref 79.3–98.0)
MONOS PCT: 8 %
Monocytes Absolute: 0.4 10*3/uL (ref 0.1–0.9)
Neutro Abs: 3.5 10*3/uL (ref 1.5–6.5)
Neutrophils Relative %: 66 %
PLATELETS: 247 10*3/uL (ref 140–400)
RBC: 3.46 MIL/uL — ABNORMAL LOW (ref 4.20–5.82)
RDW: 15.2 % — ABNORMAL HIGH (ref 11.0–14.6)
WBC: 5.3 10*3/uL (ref 4.0–10.3)

## 2018-05-20 LAB — RETICULOCYTES
RBC.: 3.46 MIL/uL — ABNORMAL LOW (ref 4.20–5.82)
Retic Count, Absolute: 34.6 10*3/uL — ABNORMAL LOW (ref 34.8–93.9)
Retic Ct Pct: 1 % (ref 0.8–1.8)

## 2018-05-20 MED ORDER — BORTEZOMIB CHEMO SQ INJECTION 3.5 MG (2.5MG/ML)
1.3000 mg/m2 | Freq: Once | INTRAMUSCULAR | Status: AC
  Administered 2018-05-20: 2.25 mg via SUBCUTANEOUS
  Filled 2018-05-20: qty 0.9

## 2018-05-20 MED ORDER — ONDANSETRON HCL 8 MG PO TABS
ORAL_TABLET | ORAL | Status: AC
  Filled 2018-05-20: qty 1

## 2018-05-20 MED ORDER — ONDANSETRON HCL 8 MG PO TABS
4.0000 mg | ORAL_TABLET | Freq: Once | ORAL | Status: AC
  Administered 2018-05-20: 4 mg via ORAL

## 2018-05-20 NOTE — Patient Instructions (Signed)
[facility] Discharge Instructions for Patients Receiving Chemotherapy  Today you received the following chemotherapy agents:  Velcade (bortezomib)  To help prevent nausea and vomiting after your treatment, we encourage you to take your nausea medication as prescribed.   If you develop nausea and vomiting that is not controlled by your nausea medication, call the clinic.   BELOW ARE SYMPTOMS THAT SHOULD BE REPORTED IMMEDIATELY:  *FEVER GREATER THAN 100.5 F  *CHILLS WITH OR WITHOUT FEVER  NAUSEA AND VOMITING THAT IS NOT CONTROLLED WITH YOUR NAUSEA MEDICATION  *UNUSUAL SHORTNESS OF BREATH  *UNUSUAL BRUISING OR BLEEDING  TENDERNESS IN MOUTH AND THROAT WITH OR WITHOUT PRESENCE OF ULCERS  *URINARY PROBLEMS  *BOWEL PROBLEMS  UNUSUAL RASH Items with * indicate a potential emergency and should be followed up as soon as possible.  Feel free to call the clinic should you have any questions or concerns. The clinic phone number is [phone].  Please show the CHEMO ALERT CARD at check-in to the Emergency Department and triage nurse.   

## 2018-05-20 NOTE — Telephone Encounter (Signed)
Scheduled appt per 6/10 los - gave patient AVS and calender per los.   

## 2018-05-21 LAB — KAPPA/LAMBDA LIGHT CHAINS
KAPPA FREE LGHT CHN: 66.8 mg/L — AB (ref 3.3–19.4)
Kappa, lambda light chain ratio: 7.26 — ABNORMAL HIGH (ref 0.26–1.65)
Lambda free light chains: 9.2 mg/L (ref 5.7–26.3)

## 2018-05-22 ENCOUNTER — Other Ambulatory Visit: Payer: Self-pay

## 2018-05-22 DIAGNOSIS — C9000 Multiple myeloma not having achieved remission: Secondary | ICD-10-CM

## 2018-05-22 LAB — MULTIPLE MYELOMA PANEL, SERUM
ALBUMIN/GLOB SERPL: 1.5 (ref 0.7–1.7)
ALPHA2 GLOB SERPL ELPH-MCNC: 0.9 g/dL (ref 0.4–1.0)
Albumin SerPl Elph-Mcnc: 3.4 g/dL (ref 2.9–4.4)
Alpha 1: 0.2 g/dL (ref 0.0–0.4)
B-GLOBULIN SERPL ELPH-MCNC: 0.8 g/dL (ref 0.7–1.3)
Gamma Glob SerPl Elph-Mcnc: 0.5 g/dL (ref 0.4–1.8)
Globulin, Total: 2.4 g/dL (ref 2.2–3.9)
IGG (IMMUNOGLOBIN G), SERUM: 588 mg/dL — AB (ref 700–1600)
IgA: 15 mg/dL — ABNORMAL LOW (ref 90–386)
IgM (Immunoglobulin M), Srm: 10 mg/dL — ABNORMAL LOW (ref 20–172)
TOTAL PROTEIN ELP: 5.8 g/dL — AB (ref 6.0–8.5)

## 2018-05-24 ENCOUNTER — Inpatient Hospital Stay: Payer: BLUE CROSS/BLUE SHIELD

## 2018-05-24 VITALS — BP 133/90 | HR 82 | Temp 98.0°F | Resp 18

## 2018-05-24 DIAGNOSIS — C9010 Plasma cell leukemia not having achieved remission: Secondary | ICD-10-CM

## 2018-05-24 DIAGNOSIS — C9000 Multiple myeloma not having achieved remission: Secondary | ICD-10-CM

## 2018-05-24 DIAGNOSIS — Z7189 Other specified counseling: Secondary | ICD-10-CM

## 2018-05-24 LAB — CBC WITH DIFFERENTIAL/PLATELET
Basophils Absolute: 0 10*3/uL (ref 0.0–0.1)
Basophils Relative: 1 %
Eosinophils Absolute: 0.1 10*3/uL (ref 0.0–0.5)
Eosinophils Relative: 2 %
HEMATOCRIT: 32.8 % — AB (ref 38.4–49.9)
HEMOGLOBIN: 11.3 g/dL — AB (ref 13.0–17.1)
LYMPHS ABS: 1 10*3/uL (ref 0.9–3.3)
LYMPHS PCT: 25 %
MCH: 31.3 pg (ref 27.2–33.4)
MCHC: 34.5 g/dL (ref 32.0–36.0)
MCV: 90.9 fL (ref 79.3–98.0)
Monocytes Absolute: 0.9 10*3/uL (ref 0.1–0.9)
Monocytes Relative: 21 %
NEUTROS ABS: 2.2 10*3/uL (ref 1.5–6.5)
NEUTROS PCT: 51 %
Platelets: 229 10*3/uL (ref 140–400)
RBC: 3.61 MIL/uL — AB (ref 4.20–5.82)
RDW: 15 % — ABNORMAL HIGH (ref 11.0–14.6)
WBC: 4.2 10*3/uL (ref 4.0–10.3)

## 2018-05-24 LAB — RETICULOCYTES
RBC.: 3.61 MIL/uL — ABNORMAL LOW (ref 4.20–5.82)
RETIC COUNT ABSOLUTE: 75.8 10*3/uL (ref 34.8–93.9)
Retic Ct Pct: 2.1 % — ABNORMAL HIGH (ref 0.8–1.8)

## 2018-05-24 MED ORDER — ONDANSETRON HCL 8 MG PO TABS
4.0000 mg | ORAL_TABLET | Freq: Once | ORAL | Status: AC
  Administered 2018-05-24: 4 mg via ORAL

## 2018-05-24 MED ORDER — BORTEZOMIB CHEMO SQ INJECTION 3.5 MG (2.5MG/ML)
1.3000 mg/m2 | Freq: Once | INTRAMUSCULAR | Status: AC
  Administered 2018-05-24: 2.25 mg via SUBCUTANEOUS
  Filled 2018-05-24: qty 0.9

## 2018-05-24 MED ORDER — ONDANSETRON HCL 8 MG PO TABS
ORAL_TABLET | ORAL | Status: AC
  Filled 2018-05-24: qty 1

## 2018-05-24 NOTE — Patient Instructions (Signed)
[facility] Discharge Instructions for Patients Receiving Chemotherapy  Today you received the following chemotherapy agents Velcade.  To help prevent nausea and vomiting after your treatment, we encourage you to take your nausea medication as directed.  If you develop nausea and vomiting that is not controlled by your nausea medication, call the clinic.   BELOW ARE SYMPTOMS THAT SHOULD BE REPORTED IMMEDIATELY:  *FEVER GREATER THAN 100.5 F  *CHILLS WITH OR WITHOUT FEVER  NAUSEA AND VOMITING THAT IS NOT CONTROLLED WITH YOUR NAUSEA MEDICATION  *UNUSUAL SHORTNESS OF BREATH  *UNUSUAL BRUISING OR BLEEDING  TENDERNESS IN MOUTH AND THROAT WITH OR WITHOUT PRESENCE OF ULCERS  *URINARY PROBLEMS  *BOWEL PROBLEMS  UNUSUAL RASH Items with * indicate a potential emergency and should be followed up as soon as possible.  Feel free to call the clinic should you have any questions or concerns. The clinic phone number is [phone].  Please show the CHEMO ALERT CARD at check-in to the Emergency Department and triage nurse.   

## 2018-05-24 NOTE — Progress Notes (Signed)
Per Dr. Irene Limbo, [facility] to treat today with only a CBC.

## 2018-05-24 NOTE — Progress Notes (Signed)
Per Dr. Irene Limbo okay to treat pt with only CBC today

## 2018-05-27 ENCOUNTER — Encounter: Payer: Self-pay | Admitting: Pharmacy Technician

## 2018-05-27 ENCOUNTER — Ambulatory Visit (INDEPENDENT_AMBULATORY_CARE_PROVIDER_SITE_OTHER): Payer: BLUE CROSS/BLUE SHIELD | Admitting: Pharmacist

## 2018-05-27 ENCOUNTER — Inpatient Hospital Stay: Payer: BLUE CROSS/BLUE SHIELD

## 2018-05-27 ENCOUNTER — Other Ambulatory Visit: Payer: BLUE CROSS/BLUE SHIELD

## 2018-05-27 VITALS — BP 134/84 | HR 82 | Temp 98.2°F | Resp 18

## 2018-05-27 DIAGNOSIS — Z7189 Other specified counseling: Secondary | ICD-10-CM

## 2018-05-27 DIAGNOSIS — C9010 Plasma cell leukemia not having achieved remission: Secondary | ICD-10-CM | POA: Diagnosis not present

## 2018-05-27 DIAGNOSIS — B182 Chronic viral hepatitis C: Secondary | ICD-10-CM | POA: Diagnosis not present

## 2018-05-27 LAB — KAPPA/LAMBDA LIGHT CHAINS
KAPPA, LAMDA LIGHT CHAIN RATIO: 7.32 — AB (ref 0.26–1.65)
Kappa free light chain: 72.5 mg/L — ABNORMAL HIGH (ref 3.3–19.4)
LAMDA FREE LIGHT CHAINS: 9.9 mg/L (ref 5.7–26.3)

## 2018-05-27 MED ORDER — ONDANSETRON HCL 8 MG PO TABS
ORAL_TABLET | ORAL | Status: AC
  Filled 2018-05-27: qty 1

## 2018-05-27 MED ORDER — BORTEZOMIB CHEMO SQ INJECTION 3.5 MG (2.5MG/ML)
1.3000 mg/m2 | Freq: Once | INTRAMUSCULAR | Status: AC
  Administered 2018-05-27: 2.25 mg via SUBCUTANEOUS
  Filled 2018-05-27: qty 0.9

## 2018-05-27 MED ORDER — ONDANSETRON HCL 8 MG PO TABS
4.0000 mg | ORAL_TABLET | Freq: Once | ORAL | Status: AC
  Administered 2018-05-27: 4 mg via ORAL

## 2018-05-27 NOTE — Progress Notes (Signed)
HPI: Andres Fernandez is a 62 y.o. male who presents to the [facility] clinic for Hep C follow-up.  He has genotype 2, F2/F3, and started 8 weeks of Mavyret on 6/1.  Patient Active Problem List   Diagnosis Date Noted  . Counseling regarding advanced care planning and goals of care 02/14/2018  . Plasma cell leukemia not having achieved remission ([place]) 12/26/2017  . Malnutrition of moderate degree 12/02/2017  . AKI (acute kidney injury) ([place]) 11/27/2017  . SIRS (systemic inflammatory response syndrome) ([place]) 11/27/2017  . Anemia 11/27/2017  . Thrombocytopenia ([place]) 11/27/2017  . Chronic hepatitis C without hepatic coma ([place]) 11/27/2017  . Cancer associated pain   . Tumor lysis syndrome   . Encounter for antineoplastic chemotherapy   . Plasma cell leukemia ([place]) 11/23/2017  . Pathological fracture in neoplastic disease, right femur, initial encounter for fracture ([place]) 11/23/2017  . Lytic bone lesion of right femur 11/20/2017  . Acute renal failure ([place]) 11/17/2017  . ?? Multiple myeloma vs other bone marrow malignancy 11/17/2017  . Hypercalcemia 11/17/2017  . BPH (benign prostatic hyperplasia) 11/17/2017  . Tobacco abuse 11/17/2017  . Elevated blood-pressure reading without diagnosis of hypertension 11/17/2017  . Lytic bone lesions on xray 11/17/2017    Patient's Medications  New Prescriptions   No medications on file  Previous Medications   ACYCLOVIR (ZOVIRAX) 400 MG TABLET    Take 1 tablet (400 mg total) by mouth daily.   CHOLECALCIFEROL (VITAMIN D) 400 UNITS TABS TABLET    Take 400 Units by mouth daily.   CYCLOPHOSPHAMIDE (CYTOXAN) 50 MG CAPSULE    Take 11 caps (571m) by mouth once weekly on days 1, 8, &15, every 21 days. Take with breakfast, maintain hydration.   DEXAMETHASONE (DECADRON) 4 MG TABLET    Take 5 tablets (20 mg total) by mouth once a week. Every Saturday morning   GLECAPREVIR-PIBRENTASVIR (MAVYRET) 100-40 MG TABS    Take 3 tablets by mouth daily with supper.     NICOTINE STEP 2 14 MG/24HR PATCH    PLACE 1 PATCH (14 MG TOTAL) ONTO THE SKIN DAILY.   ONDANSETRON (ZOFRAN) 4 MG TABLET    Take 2 tablets (8 mg total) by mouth every 8 (eight) hours as needed for nausea.   TRAZODONE (DESYREL) 50 MG TABLET    TAKE 1 TABLET BY MOUTH EVERYDAY AT BEDTIME  Modified Medications   No medications on file  Discontinued Medications   No medications on file    Allergies: No Known Allergies  Past Medical History: Past Medical History:  Diagnosis Date  . BPH (benign prostatic hyperplasia)   . Hepatitis C 11/27/2017  . Hypertension   . Plasma cell leukemia ([place] 11/23/2017  . Tobacco abuse     Social History: Social History   Socioeconomic History  . Marital status: Single    Spouse name: Not on file  . Number of children: Not on file  . Years of education: Not on file  . Highest education level: Not on file  Occupational History  . Not on file  Social Needs  . Financial resource strain: Not on file  . Food insecurity:    Worry: Not on file    Inability: Not on file  . Transportation needs:    Medical: Not on file    Non-medical: Not on file  Tobacco Use  . Smoking status: Current Every Day Smoker    Packs/day: 0.25    Types: Cigarettes    Last attempt to  quit: 11/09/2017    Years since quitting: 0.5  . Smokeless tobacco: Never Used  Substance and Sexual Activity  . Alcohol use: Yes  . Drug use: No  . Sexual activity: Not on file  Lifestyle  . Physical activity:    Days per week: Not on file    Minutes per session: Not on file  . Stress: Not on file  Relationships  . Social connections:    Talks on phone: Not on file    Gets together: Not on file    Attends religious service: Not on file    Active member of club or organization: Not on file    Attends meetings of clubs or organizations: Not on file    Relationship status: Not on file  Other Topics Concern  . Not on file  Social History Narrative  . Not on file     Labs: Hepatitis C Lab Results  Component Value Date   FIBROSTAGE F1-F2 04/11/2018   Hepatitis B Lab Results  Component Value Date   HEPBSAB NON-REACTIVE 04/11/2018   HEPBSAG Negative 11/23/2017   HEPBCAB Negative 11/23/2017   Hepatitis A Lab Results  Component Value Date   HAV REACTIVE (A) 04/11/2018   HIV Lab Results  Component Value Date   HIV NON-REACTIVE 04/11/2018   HIV Non Reactive 11/17/2017   Lab Results  Component Value Date   CREATININE 1.99 (H) 05/17/2018   CREATININE 1.78 (H) 05/07/2018   CREATININE 1.99 (H) 05/03/2018   CREATININE 2.01 (H) 04/29/2018   CREATININE 1.94 (H) 04/26/2018   Lab Results  Component Value Date   AST 21 05/17/2018   AST 34 05/07/2018   AST 43 (H) 05/03/2018   ALT 21 05/17/2018   ALT 41 05/07/2018   ALT 46 05/03/2018   INR 1.0 04/11/2018   INR 0.97 12/01/2017   INR 1.28 11/26/2017    Fibrosis Score: F2/F3 as assessed by [facility]   Assessment: Andres Fernandez is here today to follow-up for his chronic Hepatitis C infection.  He started 8 weeks of Mavyret on 6/1.  He is having no issues tolerating it except some fatigue.  He switched to taking it at night before bed and this seems to help his fatigue.  He takes all three pills together at night after dinner and before bed.  He did miss one dose last Saturday when he was out celebrating Father's Day and forgot to take it with him. Counseled to be sure not to miss anymore doses.  He lives in [place], so I will order a Hep C viral load today so he doesn't have to drive back in 1-2 weeks. He still gets chemotherapy every Monday and Friday and is doing well with that so far. I will see him again when he has completed therapy.   Plan: - Continue Mavyret x 8 weeks - Hep C viral load today - F/u with me again 7/26 at [place]. Francelia Mclaren, PharmD, [place], [place] [facility]ious [facility] 05/27/2018, 2:26 PM

## 2018-05-27 NOTE — Patient Instructions (Signed)
[facility] Discharge Instructions for Patients Receiving Chemotherapy  Today you received the following chemotherapy agents Velcade.  To help prevent nausea and vomiting after your treatment, we encourage you to take your nausea medication as directed.  If you develop nausea and vomiting that is not controlled by your nausea medication, call the clinic.   BELOW ARE SYMPTOMS THAT SHOULD BE REPORTED IMMEDIATELY:  *FEVER GREATER THAN 100.5 F  *CHILLS WITH OR WITHOUT FEVER  NAUSEA AND VOMITING THAT IS NOT CONTROLLED WITH YOUR NAUSEA MEDICATION  *UNUSUAL SHORTNESS OF BREATH  *UNUSUAL BRUISING OR BLEEDING  TENDERNESS IN MOUTH AND THROAT WITH OR WITHOUT PRESENCE OF ULCERS  *URINARY PROBLEMS  *BOWEL PROBLEMS  UNUSUAL RASH Items with * indicate a potential emergency and should be followed up as soon as possible.  Feel free to call the clinic should you have any questions or concerns. The clinic phone number is [phone].  Please show the CHEMO ALERT CARD at check-in to the Emergency Department and triage nurse.   

## 2018-05-28 ENCOUNTER — Other Ambulatory Visit: Payer: Self-pay

## 2018-05-28 ENCOUNTER — Other Ambulatory Visit: Payer: Self-pay | Admitting: Hematology

## 2018-05-28 DIAGNOSIS — C9000 Multiple myeloma not having achieved remission: Secondary | ICD-10-CM

## 2018-05-28 LAB — MULTIPLE MYELOMA PANEL, SERUM
Albumin SerPl Elph-Mcnc: 4.1 g/dL (ref 2.9–4.4)
Albumin/Glob SerPl: 1.7 (ref 0.7–1.7)
Alpha 1: 0.2 g/dL (ref 0.0–0.4)
Alpha2 Glob SerPl Elph-Mcnc: 1 g/dL (ref 0.4–1.0)
B-GLOBULIN SERPL ELPH-MCNC: 0.9 g/dL (ref 0.7–1.3)
GAMMA GLOB SERPL ELPH-MCNC: 0.4 g/dL (ref 0.4–1.8)
GLOBULIN, TOTAL: 2.5 g/dL (ref 2.2–3.9)
IGA: 15 mg/dL — AB (ref 90–386)
IgG (Immunoglobin G), Serum: 632 mg/dL — ABNORMAL LOW (ref 700–1600)
IgM (Immunoglobulin M), Srm: 12 mg/dL — ABNORMAL LOW (ref 20–172)
Total Protein ELP: 6.6 g/dL (ref 6.0–8.5)

## 2018-05-28 MED ORDER — DEXAMETHASONE 4 MG PO TABS: 20.0000 mg | ORAL_TABLET | ORAL | 1 refills | Status: DC

## 2018-05-28 MED ORDER — CYCLOPHOSPHAMIDE 50 MG PO CAPS: ORAL_CAPSULE | ORAL | 1 refills | Status: DC

## 2018-05-29 LAB — HEPATITIS C RNA QUANTITATIVE
HCV QUANT LOG: NOT DETECTED {Log_IU}/mL
HCV RNA, PCR, QN: NOT DETECTED [IU]/mL

## 2018-06-07 ENCOUNTER — Ambulatory Visit: Payer: BLUE CROSS/BLUE SHIELD

## 2018-06-07 ENCOUNTER — Inpatient Hospital Stay: Payer: BLUE CROSS/BLUE SHIELD

## 2018-06-07 VITALS — BP 131/89 | HR 84 | Temp 98.3°F

## 2018-06-07 DIAGNOSIS — C9010 Plasma cell leukemia not having achieved remission: Secondary | ICD-10-CM

## 2018-06-07 DIAGNOSIS — C9000 Multiple myeloma not having achieved remission: Secondary | ICD-10-CM

## 2018-06-07 DIAGNOSIS — M84551A Pathological fracture in neoplastic disease, right femur, initial encounter for fracture: Secondary | ICD-10-CM

## 2018-06-07 DIAGNOSIS — Z7189 Other specified counseling: Secondary | ICD-10-CM

## 2018-06-07 LAB — RETICULOCYTES
RBC.: 3.35 MIL/uL — ABNORMAL LOW (ref 4.20–5.82)
RETIC CT PCT: 1.6 % (ref 0.8–1.8)
Retic Count, Absolute: 53.6 10*3/uL (ref 34.8–93.9)

## 2018-06-07 LAB — CBC WITH DIFFERENTIAL (CANCER CENTER ONLY)
BASOS ABS: 0.1 10*3/uL (ref 0.0–0.1)
Basophils Relative: 1 %
EOS ABS: 0.1 10*3/uL (ref 0.0–0.5)
EOS PCT: 3 %
HCT: 30.9 % — ABNORMAL LOW (ref 38.4–49.9)
Hemoglobin: 10.6 g/dL — ABNORMAL LOW (ref 13.0–17.1)
LYMPHS ABS: 1.2 10*3/uL (ref 0.9–3.3)
LYMPHS PCT: 22 %
MCH: 31.6 pg (ref 27.2–33.4)
MCHC: 34.3 g/dL (ref 32.0–36.0)
MCV: 92.2 fL (ref 79.3–98.0)
MONO ABS: 0.9 10*3/uL (ref 0.1–0.9)
Monocytes Relative: 16 %
Neutro Abs: 3.2 10*3/uL (ref 1.5–6.5)
Neutrophils Relative %: 58 %
Platelet Count: 243 10*3/uL (ref 140–400)
RBC: 3.35 MIL/uL — AB (ref 4.20–5.82)
RDW: 14.4 % (ref 11.0–14.6)
WBC: 5.5 10*3/uL (ref 4.0–10.3)

## 2018-06-07 LAB — COMPREHENSIVE METABOLIC PANEL
ALBUMIN: 3.9 g/dL (ref 3.5–5.0)
ALT: 12 U/L (ref 0–44)
ANION GAP: 6 (ref 5–15)
AST: 16 U/L (ref 15–41)
Alkaline Phosphatase: 50 U/L (ref 38–126)
BILIRUBIN TOTAL: 0.6 mg/dL (ref 0.3–1.2)
BUN: 25 mg/dL — ABNORMAL HIGH (ref 6–20)
CHLORIDE: 113 mmol/L — AB (ref 98–111)
CO2: 25 mmol/L (ref 22–32)
Calcium: 9 mg/dL (ref 8.9–10.3)
Creatinine, Ser: 1.9 mg/dL — ABNORMAL HIGH (ref 0.61–1.24)
GFR calc Af Amer: 43 mL/min — ABNORMAL LOW (ref >60)
GFR, EST NON AFRICAN AMERICAN: 37 mL/min — AB (ref >60)
Glucose, Bld: 73 mg/dL (ref 70–99)
Potassium: 5.3 mmol/L — ABNORMAL HIGH (ref 3.5–5.1)
Sodium: 144 mmol/L (ref 135–145)
TOTAL PROTEIN: 6.9 g/dL (ref 6.5–8.1)

## 2018-06-07 MED ORDER — DEXAMETHASONE 4 MG PO TABS
40.0000 mg | ORAL_TABLET | Freq: Once | ORAL | Status: AC
  Administered 2018-06-07: 40 mg via ORAL

## 2018-06-07 MED ORDER — ONDANSETRON HCL 8 MG PO TABS
ORAL_TABLET | ORAL | Status: AC
  Filled 2018-06-07: qty 1

## 2018-06-07 MED ORDER — DENOSUMAB 120 MG/1.7ML ~~LOC~~ SOLN
SUBCUTANEOUS | Status: AC
  Filled 2018-06-07: qty 1.7

## 2018-06-07 MED ORDER — ONDANSETRON HCL 8 MG PO TABS
4.0000 mg | ORAL_TABLET | Freq: Once | ORAL | Status: AC
  Administered 2018-06-07: 4 mg via ORAL

## 2018-06-07 MED ORDER — BORTEZOMIB CHEMO SQ INJECTION 3.5 MG (2.5MG/ML)
1.3000 mg/m2 | Freq: Once | INTRAMUSCULAR | Status: AC
  Administered 2018-06-07: 2.25 mg via SUBCUTANEOUS
  Filled 2018-06-07: qty 0.9

## 2018-06-07 MED ORDER — DEXAMETHASONE 4 MG PO TABS
ORAL_TABLET | ORAL | Status: AC
  Filled 2018-06-07: qty 10

## 2018-06-07 MED ORDER — DENOSUMAB 120 MG/1.7ML ~~LOC~~ SOLN
120.0000 mg | Freq: Once | SUBCUTANEOUS | Status: AC
  Administered 2018-06-07: 120 mg via SUBCUTANEOUS

## 2018-06-07 NOTE — Patient Instructions (Signed)
[place] Discharge Instructions for Patients Receiving Chemotherapy  Today you received the following chemotherapy agents Velcade, Xgeva.  To help prevent nausea and vomiting after your treatment, we encourage you to take your nausea medication as directed   If you develop nausea and vomiting that is not controlled by your nausea medication, call the clinic.   BELOW ARE SYMPTOMS THAT SHOULD BE REPORTED IMMEDIATELY:  *FEVER GREATER THAN 100.5 F  *CHILLS WITH OR WITHOUT FEVER  NAUSEA AND VOMITING THAT IS NOT CONTROLLED WITH YOUR NAUSEA MEDICATION  *UNUSUAL SHORTNESS OF BREATH  *UNUSUAL BRUISING OR BLEEDING  TENDERNESS IN MOUTH AND THROAT WITH OR WITHOUT PRESENCE OF ULCERS  *URINARY PROBLEMS  *BOWEL PROBLEMS  UNUSUAL RASH Items with * indicate a potential emergency and should be followed up as soon as possible.  Feel free to call the clinic should you have any questions or concerns. The clinic phone number is [phone].  Please show the [place] at check-in to the Emergency Department and triage nurse.

## 2018-06-07 NOTE — Progress Notes (Signed)
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 His labs today 12/14/2017 show hgb 8.3, platelets 524k. He is doing well overall. He has staples in his right upper leg following a recent surgery and has not been in touch with the surgeons for a f/u of this. He is not receiving physical therapy and is not getting around at home.  He notes that he was not scheduled for a post hospitalization nephrology f/u and has no PCP.  On review of systems, pt reports intermittent fatigue, SOB, pain to the left leg, dizziness, dry mouth and denies fever, chills, night sweats, dysuria and any other accompanying symptoms.   INTERVAL HISTORY:   Andres Fernandez presents today for follow up and C10 of treatment of his myeloma/plasma cell leukemia. The patient's last visit with [name] was on 05/20/18. The pt reports that he is doing well overall.   The pt presented to the ED for chest pain after lifting some very heavy objects at work and was evaluated with an EKG which was normal. The pt believes that he might have strained his pectoral muscle.  The pt began Mayvret on May 28 with Dr Linus Salmons and will complete the course at the end of July. He is planning on going to [facility] for a discussion about a transplant after finishing his course. The pt notes that his situation in limiting for choosing a transplant but he is open to hearing more from [facility].   Lab results today (06/10/18) of CBC w/diff, CMP, and Reticulocytes is as follows: all values are WNL except for RBC at 3.26, HGB at 10.4, HCT at 30.2, Monocytes abs at 1.0k, Glucose at 101, Creatinine at 1.84, Calcium at 8.7, AST at 11, GFR at 44. Hep C RNA Quant 05/27/18 was not detectable. MMP 06/10/18 shows no M  spike. Kappa/Lambda 06/10/18 I- kappa FLC down to 66.4 with K/L ratio of 7.14  On review of systems, pt reports resolved chest pain, good energy levels, and denies fevers, chills, night sweats, new bone pains, abdominal pains, leg swelling, and any other symptoms.   REVIEW OF SYSTEMS:   A 10+ POINT REVIEW OF SYSTEMS WAS OBTAINED including neurology, dermatology, psychiatry, cardiac, respiratory, lymph, extremities, GI, GU, Musculoskeletal, constitutional, breasts, reproductive, HEENT.  All pertinent positives are noted in the HPI.  All others are negative.    MEDICAL HISTORY:  Past Medical History:  Diagnosis Date  . BPH (benign prostatic hyperplasia)   . Hepatitis C 11/27/2017  . Hypertension   . Plasma cell leukemia ([place]) 11/23/2017  . Tobacco abuse     SURGICAL HISTORY: Past Surgical History:  Procedure Laterality Date  . APPENDECTOMY    . FEMUR IM NAIL Right 11/20/2017   Procedure: RIGHT FEMORAL INTRAMEDULLARY (IM) NAIL;  Surgeon: Nicholes Stairs, MD;  Location: [facility];  Service: Orthopedics;  Laterality: Right;    SOCIAL HISTORY: Social History   Socioeconomic History  . Marital status: Single    Spouse name: Not on file  . Number of children: Not on file  . Years of education: Not on file  . Highest education level: Not on file  Occupational History  . Not on file  Social Needs  . Financial resource strain: Not on file  . Food  insecurity:    Worry: Not on file    Inability: Not on file  . Transportation needs:    Medical: Not on file    Non-medical: Not on file  Tobacco Use  . Smoking status: Current Every Day Smoker    Packs/day: 0.25    Types: Cigarettes    Last attempt to quit: 11/09/2017    Years since quitting: 0.5  . Smokeless tobacco: Never Used  Substance and Sexual Activity  . Alcohol use: Yes  . Drug use: No  . Sexual activity: Not on file  Lifestyle  . Physical activity:    Days per week: Not on file    Minutes per session: Not on file   . Stress: Not on file  Relationships  . Social connections:    Talks on phone: Not on file    Gets together: Not on file    Attends religious service: Not on file    Active member of club or organization: Not on file    Attends meetings of clubs or organizations: Not on file    Relationship status: Not on file  . Intimate partner violence:    Fear of current or ex partner: Not on file    Emotionally abused: Not on file    Physically abused: Not on file    Forced sexual activity: Not on file  Other Topics Concern  . Not on file  Social History Narrative  . Not on file    FAMILY HISTORY: Family History  Problem Relation Age of Onset  . COPD Mother   . Liver cancer Mother     ALLERGIES:  has No Known Allergies.  MEDICATIONS:  . Current Outpatient Medications on File Prior to Visit  Medication Sig Dispense Refill  . acyclovir (ZOVIRAX) 400 MG tablet Take 1 tablet (400 mg total) by mouth daily. 30 tablet 6  . cholecalciferol (VITAMIN D) 400 units TABS tablet Take 400 Units by mouth daily.    . cyclophosphamide (CYTOXAN) 50 MG capsule Take 11 caps (531m) by mouth once weekly on days 1, 8, &15, every 21 days. Take with breakfast, maintain hydration. 33 capsule 1  . dexamethasone (DECADRON) 4 MG tablet Take 5 tablets (20 mg total) by mouth once a week. Every Saturday morning 20 tablet 1  . Glecaprevir-Pibrentasvir (MAVYRET) 100-40 MG TABS Take 3 tablets by mouth daily with supper. 84 tablet 1  . NICOTINE STEP 2 14 MG/24HR patch PLACE 1 PATCH (14 MG TOTAL) ONTO THE SKIN DAILY. 28 patch 1  . ondansetron (ZOFRAN) 4 MG tablet Take 2 tablets (8 mg total) by mouth every 8 (eight) hours as needed for nausea. 30 tablet 0  . traZODone (DESYREL) 50 MG tablet TAKE 1 TABLET BY MOUTH EVERYDAY AT BEDTIME 30 tablet 0   No current facility-administered medications on file prior to visit.     PHYSICAL EXAMINATION:  VS reviewed Vitals:   06/10/18 1342  BP: 138/83  Pulse: 84  Resp: 18   Temp: 98.4 F (36.9 C)  SpO2: 100%     GENERAL:alert, in no acute distress and comfortable SKIN: no acute rashes, no significant lesions EYES: conjunctiva are pink and non-injected, sclera anicteric OROPHARYNX: MMM, no exudates, no oropharyngeal erythema or ulceration NECK: supple, no JVD LYMPH:  no palpable lymphadenopathy in the cervical, axillary or inguinal regions LUNGS: clear to auscultation b/l with normal respiratory effort HEART: regular rate & rhythm ABDOMEN:  normoactive bowel sounds , non tender, not distended. Extremity: no pedal edema PSYCH:  alert & oriented x 3 with fluent speech NEURO: no focal motor/sensory deficits   LABORATORY DATA:  I have reviewed the data as listed  . CBC Latest Ref Rng & Units 06/10/2018 06/07/2018 05/24/2018  WBC 4.0 - 10.3 K/uL 5.3 5.5 4.2  Hemoglobin 13.0 - 17.1 g/dL 10.4(L) 10.6(L) 11.3(L)  Hematocrit 38.4 - 49.9 % 30.2(L) 30.9(L) 32.8(L)  Platelets 140 - 400 K/uL 265 243 229   . CBC    Component Value Date/Time   WBC 5.3 06/10/2018 1249   WBC 4.2 05/24/2018 0825   RBC 3.26 (L) 06/10/2018 1249   RBC 3.26 (L) 06/10/2018 1249   HGB 10.4 (L) 06/10/2018 1249   HGB 8.3 (L) 12/14/2017 1230   HCT 30.2 (L) 06/10/2018 1249   HCT 23.9 (L) 12/14/2017 1230   PLT 265 06/10/2018 1249   PLT 524 (H) 12/14/2017 1230   MCV 92.6 06/10/2018 1249   MCV 88.4 12/14/2017 1230   MCH 31.9 06/10/2018 1249   MCHC 34.4 06/10/2018 1249   RDW 14.4 06/10/2018 1249   RDW 15.3 (H) 12/14/2017 1230   LYMPHSABS 0.9 06/10/2018 1249   LYMPHSABS 1.4 12/14/2017 1230   MONOABS 1.0 (H) 06/10/2018 1249   MONOABS 0.6 12/14/2017 1230   EOSABS 0.1 06/10/2018 1249   EOSABS 0.1 12/14/2017 1230   BASOSABS 0.0 06/10/2018 1249   BASOSABS 0.1 12/14/2017 1230     . CMP Latest Ref Rng & Units 06/10/2018 06/07/2018 05/17/2018  Glucose 70 - 99 mg/dL 101(H) 73 76  BUN 6 - 20 mg/dL 19 25(H) 24  Creatinine 0.61 - 1.24 mg/dL 1.84(H) 1.90(H) 1.99(H)  Sodium 135 - 145 mmol/L 142  144 142  Potassium 3.5 - 5.1 mmol/L 4.5 5.3(H) 4.7  Chloride 98 - 111 mmol/L 111 113(H) 111(H)  CO2 22 - 32 mmol/L 24 25 21(L)  Calcium 8.9 - 10.3 mg/dL 8.7(L) 9.0 8.9  Total Protein 6.5 - 8.1 g/dL 6.5 6.9 6.6  Total Bilirubin 0.3 - 1.2 mg/dL 0.5 0.6 0.4  Alkaline Phos 38 - 126 U/L 58 50 57  AST 15 - 41 U/L 11(L) 16 21  ALT 0 - 44 U/L 8 12 21             RADIOGRAPHIC STUDIES: I have personally reviewed the radiological images as listed and agreed with the findings in the report. No results found.  ASSESSMENT & PLAN:   62 y.o.A male presenting with severe hypercalcemia, skeletal findings consistent with myeloma and significant leukocytosis/lymphocytosis in the peripheral blood as well as AKI and hypercalcemia.  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 -Hypercalcemia -Renal Failure -Anemia -Extensive Bone lesions -Appears to be primarily Kappa Light chain Myeloma with no overt M spike. Appears to be responding to treatment.- with decreased kappa light chains.   BM Bx showed >95% involvement with kappa restricted serum free light chains. 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 MoL Cy-    PLAN -Pt has begun treatment with maverick for hep c with Dr Linus Salmons for 8 weeks -We will continue with CyBorD until myeloma and kappa/lambda free light chains cease to respond or patient achieves CR -Will continue to monitor peripheral neuropathy while pt takes Velcade -Continue Xgeva every 4 weeks, Velcade  -Follow up with PCP Dr Christa See  -continue Acyclovir prophylaxis. -weekly labs on D1 AND D8 each cycle -Discussed pt labwork today, 06/10/18; HGB stable at 10.4, Creatinine stable at 1.84, Hep C RNA from 05/27/18 was undetectable  -Discussed  that the pt should consider what his goals of care will be, including BM transplant, or consideration of second line treatments if CR is not achieved vs maintenance  therapy. -The pt has no prohibitive toxicities from continuing C10 of CyBorD SQ q21d at this time.   2) s/p Prophylactic IM nailing for rt femur. -Advised that we will re-evaluate the patient returning to work by March on the next visit following Physical Therapy at the [facility].  -patient reports improvement and is currently progressively back to full time work.  3) Thrombocytopenia resolved  4)Renal insufficiency -- likely primarily related to Myeloma -Kidney numbers improved. 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  7) Renal insuff- due to myeloma.  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 Plan -Hgb at 10.8 and stable   RTC with Dr Irene Limbo in 3 weeks  Labs D1 and D8 each cycle Continue Xgeva q4weeks (schedule on one of his Velcade treatment days to avoid extra visit --lives in [place])    . The total time spent in the appointment was 25 minutes and more than 50% was on counseling and direct patient cares.     Sullivan Lone MD [place] AAHIVMS [facility] Hematology/Oncology Physician [facility]  (Office):       [phone] (Work cell):  [phone] (Fax):           [phone]  I, Baldwin Jamaica, am acting as a scribe for Dr Irene Limbo.   .I have reviewed the above documentation for accuracy and completeness, and I agree with the above. Brunetta Genera MD

## 2018-06-07 NOTE — Progress Notes (Signed)
Per Dr Irene Limbo ok to tx with creatinine of 1.9 today.

## 2018-06-10 ENCOUNTER — Encounter: Payer: Self-pay | Admitting: Hematology

## 2018-06-10 ENCOUNTER — Inpatient Hospital Stay: Payer: BLUE CROSS/BLUE SHIELD

## 2018-06-10 ENCOUNTER — Inpatient Hospital Stay (HOSPITAL_BASED_OUTPATIENT_CLINIC_OR_DEPARTMENT_OTHER): Payer: BLUE CROSS/BLUE SHIELD | Admitting: Hematology

## 2018-06-10 ENCOUNTER — Inpatient Hospital Stay: Payer: BLUE CROSS/BLUE SHIELD | Attending: Hematology

## 2018-06-10 VITALS — BP 138/83 | HR 84 | Temp 98.4°F | Resp 18 | Ht 68.0 in | Wt 162.7 lb

## 2018-06-10 DIAGNOSIS — C638 Malignant neoplasm of overlapping sites of male genital organs: Secondary | ICD-10-CM

## 2018-06-10 DIAGNOSIS — F17210 Nicotine dependence, cigarettes, uncomplicated: Secondary | ICD-10-CM | POA: Insufficient documentation

## 2018-06-10 DIAGNOSIS — Z79899 Other long term (current) drug therapy: Secondary | ICD-10-CM

## 2018-06-10 DIAGNOSIS — C9010 Plasma cell leukemia not having achieved remission: Secondary | ICD-10-CM | POA: Insufficient documentation

## 2018-06-10 DIAGNOSIS — F1411 Cocaine abuse, in remission: Secondary | ICD-10-CM | POA: Diagnosis not present

## 2018-06-10 DIAGNOSIS — C9000 Multiple myeloma not having achieved remission: Secondary | ICD-10-CM

## 2018-06-10 DIAGNOSIS — Z7189 Other specified counseling: Secondary | ICD-10-CM

## 2018-06-10 DIAGNOSIS — R0602 Shortness of breath: Secondary | ICD-10-CM | POA: Insufficient documentation

## 2018-06-10 DIAGNOSIS — B1920 Unspecified viral hepatitis C without hepatic coma: Secondary | ICD-10-CM

## 2018-06-10 DIAGNOSIS — R5383 Other fatigue: Secondary | ICD-10-CM

## 2018-06-10 DIAGNOSIS — I10 Essential (primary) hypertension: Secondary | ICD-10-CM

## 2018-06-10 DIAGNOSIS — M899 Disorder of bone, unspecified: Secondary | ICD-10-CM | POA: Insufficient documentation

## 2018-06-10 DIAGNOSIS — N400 Enlarged prostate without lower urinary tract symptoms: Secondary | ICD-10-CM | POA: Insufficient documentation

## 2018-06-10 DIAGNOSIS — Z5111 Encounter for antineoplastic chemotherapy: Secondary | ICD-10-CM | POA: Diagnosis not present

## 2018-06-10 DIAGNOSIS — F1011 Alcohol abuse, in remission: Secondary | ICD-10-CM | POA: Diagnosis not present

## 2018-06-10 DIAGNOSIS — G629 Polyneuropathy, unspecified: Secondary | ICD-10-CM | POA: Insufficient documentation

## 2018-06-10 DIAGNOSIS — N289 Disorder of kidney and ureter, unspecified: Secondary | ICD-10-CM

## 2018-06-10 DIAGNOSIS — D638 Anemia in other chronic diseases classified elsewhere: Secondary | ICD-10-CM | POA: Diagnosis not present

## 2018-06-10 DIAGNOSIS — R42 Dizziness and giddiness: Secondary | ICD-10-CM | POA: Diagnosis not present

## 2018-06-10 LAB — CBC WITH DIFFERENTIAL (CANCER CENTER ONLY)
Basophils Absolute: 0 10*3/uL (ref 0.0–0.1)
Basophils Relative: 1 %
EOS PCT: 1 %
Eosinophils Absolute: 0.1 10*3/uL (ref 0.0–0.5)
HCT: 30.2 % — ABNORMAL LOW (ref 38.4–49.9)
Hemoglobin: 10.4 g/dL — ABNORMAL LOW (ref 13.0–17.1)
LYMPHS ABS: 0.9 10*3/uL (ref 0.9–3.3)
Lymphocytes Relative: 16 %
MCH: 31.9 pg (ref 27.2–33.4)
MCHC: 34.4 g/dL (ref 32.0–36.0)
MCV: 92.6 fL (ref 79.3–98.0)
MONO ABS: 1 10*3/uL — AB (ref 0.1–0.9)
Monocytes Relative: 19 %
Neutro Abs: 3.4 10*3/uL (ref 1.5–6.5)
Neutrophils Relative %: 63 %
Platelet Count: 265 10*3/uL (ref 140–400)
RBC: 3.26 MIL/uL — AB (ref 4.20–5.82)
RDW: 14.4 % (ref 11.0–14.6)
WBC: 5.3 10*3/uL (ref 4.0–10.3)

## 2018-06-10 LAB — COMPREHENSIVE METABOLIC PANEL
ALT: 8 U/L (ref 0–44)
AST: 11 U/L — AB (ref 15–41)
Albumin: 3.9 g/dL (ref 3.5–5.0)
Alkaline Phosphatase: 58 U/L (ref 38–126)
Anion gap: 7 (ref 5–15)
BILIRUBIN TOTAL: 0.5 mg/dL (ref 0.3–1.2)
BUN: 19 mg/dL (ref 6–20)
CHLORIDE: 111 mmol/L (ref 98–111)
CO2: 24 mmol/L (ref 22–32)
CREATININE: 1.84 mg/dL — AB (ref 0.61–1.24)
Calcium: 8.7 mg/dL — ABNORMAL LOW (ref 8.9–10.3)
GFR calc Af Amer: 44 mL/min — ABNORMAL LOW (ref >60)
GFR, EST NON AFRICAN AMERICAN: 38 mL/min — AB (ref >60)
Glucose, Bld: 101 mg/dL — ABNORMAL HIGH (ref 70–99)
Potassium: 4.5 mmol/L (ref 3.5–5.1)
Sodium: 142 mmol/L (ref 135–145)
Total Protein: 6.5 g/dL (ref 6.5–8.1)

## 2018-06-10 LAB — RETICULOCYTES
RBC.: 3.26 MIL/uL — ABNORMAL LOW (ref 4.20–5.82)
RETIC CT PCT: 1.4 % (ref 0.8–1.8)
Retic Count, Absolute: 45.6 10*3/uL (ref 34.8–93.9)

## 2018-06-10 MED ORDER — ONDANSETRON HCL 8 MG PO TABS
4.0000 mg | ORAL_TABLET | Freq: Once | ORAL | Status: AC
  Administered 2018-06-10: 4 mg via ORAL

## 2018-06-10 MED ORDER — BORTEZOMIB CHEMO SQ INJECTION 3.5 MG (2.5MG/ML)
1.3000 mg/m2 | Freq: Once | INTRAMUSCULAR | Status: AC
  Administered 2018-06-10: 2.25 mg via SUBCUTANEOUS
  Filled 2018-06-10: qty 0.9

## 2018-06-10 MED ORDER — ONDANSETRON HCL 8 MG PO TABS
ORAL_TABLET | ORAL | Status: AC
  Filled 2018-06-10: qty 1

## 2018-06-10 NOTE — Patient Instructions (Signed)
[facility] Discharge Instructions for Patients Receiving Chemotherapy  Today you received the following chemotherapy agents bortezomib (Velcade)  To help prevent nausea and vomiting after your treatment, we encourage you to take your nausea medication as directed.   If you develop nausea and vomiting that is not controlled by your nausea medication, call the clinic.   BELOW ARE SYMPTOMS THAT SHOULD BE REPORTED IMMEDIATELY:  *FEVER GREATER THAN 100.5 F  *CHILLS WITH OR WITHOUT FEVER  NAUSEA AND VOMITING THAT IS NOT CONTROLLED WITH YOUR NAUSEA MEDICATION  *UNUSUAL SHORTNESS OF BREATH  *UNUSUAL BRUISING OR BLEEDING  TENDERNESS IN MOUTH AND THROAT WITH OR WITHOUT PRESENCE OF ULCERS  *URINARY PROBLEMS  *BOWEL PROBLEMS  UNUSUAL RASH Items with * indicate a potential emergency and should be followed up as soon as possible.  Feel free to call the clinic should you have any questions or concerns. The clinic phone number is [phone].  Please show the CHEMO ALERT CARD at check-in to the Emergency Department and triage nurse.   

## 2018-06-10 NOTE — Progress Notes (Signed)
Dr. Irene Limbo ok to tx with ctn 1.84.

## 2018-06-11 ENCOUNTER — Telehealth: Payer: Self-pay

## 2018-06-11 LAB — KAPPA/LAMBDA LIGHT CHAINS
KAPPA, LAMDA LIGHT CHAIN RATIO: 7.14 — AB (ref 0.26–1.65)
Kappa free light chain: 66.4 mg/L — ABNORMAL HIGH (ref 3.3–19.4)
Lambda free light chains: 9.3 mg/L (ref 5.7–26.3)

## 2018-06-11 NOTE — Telephone Encounter (Signed)
Spoke with patient concerning his upcoming appointment. Per 7/1 los. Will be mailing him a letter and a calender enclosed

## 2018-06-12 LAB — MULTIPLE MYELOMA PANEL, SERUM
ALBUMIN SERPL ELPH-MCNC: 3.6 g/dL (ref 2.9–4.4)
ALPHA 1: 0.2 g/dL (ref 0.0–0.4)
Albumin/Glob SerPl: 1.4 (ref 0.7–1.7)
Alpha2 Glob SerPl Elph-Mcnc: 1 g/dL (ref 0.4–1.0)
B-GLOBULIN SERPL ELPH-MCNC: 0.9 g/dL (ref 0.7–1.3)
GAMMA GLOB SERPL ELPH-MCNC: 0.5 g/dL (ref 0.4–1.8)
Globulin, Total: 2.6 g/dL (ref 2.2–3.9)
IGG (IMMUNOGLOBIN G), SERUM: 582 mg/dL — AB (ref 700–1600)
IGM (IMMUNOGLOBULIN M), SRM: 10 mg/dL — AB (ref 20–172)
IgA: 16 mg/dL — ABNORMAL LOW (ref 90–386)
TOTAL PROTEIN ELP: 6.2 g/dL (ref 6.0–8.5)

## 2018-06-14 ENCOUNTER — Inpatient Hospital Stay: Payer: BLUE CROSS/BLUE SHIELD

## 2018-06-14 VITALS — BP 132/86 | HR 82 | Temp 98.1°F | Resp 18 | Ht 68.0 in | Wt 162.4 lb

## 2018-06-14 DIAGNOSIS — C9000 Multiple myeloma not having achieved remission: Secondary | ICD-10-CM

## 2018-06-14 DIAGNOSIS — C9010 Plasma cell leukemia not having achieved remission: Secondary | ICD-10-CM

## 2018-06-14 DIAGNOSIS — Z7189 Other specified counseling: Secondary | ICD-10-CM

## 2018-06-14 LAB — COMPREHENSIVE METABOLIC PANEL
ALBUMIN: 4 g/dL (ref 3.5–5.0)
ALT: 12 U/L (ref 0–44)
AST: 16 U/L (ref 15–41)
Alkaline Phosphatase: 51 U/L (ref 38–126)
Anion gap: 11 (ref 5–15)
BILIRUBIN TOTAL: 0.5 mg/dL (ref 0.3–1.2)
BUN: 23 mg/dL — ABNORMAL HIGH (ref 6–20)
CHLORIDE: 108 mmol/L (ref 98–111)
CO2: 24 mmol/L (ref 22–32)
Calcium: 9.1 mg/dL (ref 8.9–10.3)
Creatinine, Ser: 2.12 mg/dL — ABNORMAL HIGH (ref 0.61–1.24)
GFR calc Af Amer: 37 mL/min — ABNORMAL LOW (ref >60)
GFR calc non Af Amer: 32 mL/min — ABNORMAL LOW (ref >60)
Glucose, Bld: 69 mg/dL — ABNORMAL LOW (ref 70–99)
POTASSIUM: 4.9 mmol/L (ref 3.5–5.1)
Sodium: 143 mmol/L (ref 135–145)
TOTAL PROTEIN: 7 g/dL (ref 6.5–8.1)

## 2018-06-14 MED ORDER — ONDANSETRON HCL 8 MG PO TABS
ORAL_TABLET | ORAL | Status: AC
  Filled 2018-06-14: qty 1

## 2018-06-14 MED ORDER — ONDANSETRON HCL 8 MG PO TABS
4.0000 mg | ORAL_TABLET | Freq: Once | ORAL | Status: AC
  Administered 2018-06-14: 4 mg via ORAL

## 2018-06-14 MED ORDER — BORTEZOMIB CHEMO SQ INJECTION 3.5 MG (2.5MG/ML)
1.3000 mg/m2 | Freq: Once | INTRAMUSCULAR | Status: AC
  Administered 2018-06-14: 2.25 mg via SUBCUTANEOUS
  Filled 2018-06-14: qty 0.9

## 2018-06-14 NOTE — Patient Instructions (Signed)
[facility] Discharge Instructions for Patients Receiving Chemotherapy  Today you received the following chemotherapy agents bortezomib (Velcade)  To help prevent nausea and vomiting after your treatment, we encourage you to take your nausea medication as directed.   If you develop nausea and vomiting that is not controlled by your nausea medication, call the clinic.   BELOW ARE SYMPTOMS THAT SHOULD BE REPORTED IMMEDIATELY:  *FEVER GREATER THAN 100.5 F  *CHILLS WITH OR WITHOUT FEVER  NAUSEA AND VOMITING THAT IS NOT CONTROLLED WITH YOUR NAUSEA MEDICATION  *UNUSUAL SHORTNESS OF BREATH  *UNUSUAL BRUISING OR BLEEDING  TENDERNESS IN MOUTH AND THROAT WITH OR WITHOUT PRESENCE OF ULCERS  *URINARY PROBLEMS  *BOWEL PROBLEMS  UNUSUAL RASH Items with * indicate a potential emergency and should be followed up as soon as possible.  Feel free to call the clinic should you have any questions or concerns. The clinic phone number is [phone].  Please show the CHEMO ALERT CARD at check-in to the Emergency Department and triage nurse.   

## 2018-06-17 ENCOUNTER — Inpatient Hospital Stay: Payer: BLUE CROSS/BLUE SHIELD | Admitting: *Deleted

## 2018-06-17 ENCOUNTER — Other Ambulatory Visit: Payer: Self-pay

## 2018-06-17 ENCOUNTER — Inpatient Hospital Stay: Payer: BLUE CROSS/BLUE SHIELD

## 2018-06-17 VITALS — BP 122/81 | HR 84 | Temp 98.1°F | Resp 16

## 2018-06-17 DIAGNOSIS — C9010 Plasma cell leukemia not having achieved remission: Secondary | ICD-10-CM | POA: Diagnosis not present

## 2018-06-17 DIAGNOSIS — C9000 Multiple myeloma not having achieved remission: Secondary | ICD-10-CM

## 2018-06-17 DIAGNOSIS — Z7189 Other specified counseling: Secondary | ICD-10-CM

## 2018-06-17 LAB — COMPREHENSIVE METABOLIC PANEL
ALBUMIN: 3.8 g/dL (ref 3.5–5.0)
ALK PHOS: 56 U/L (ref 38–126)
ALT: 12 U/L (ref 0–44)
AST: 11 U/L — AB (ref 15–41)
Anion gap: 7 (ref 5–15)
BILIRUBIN TOTAL: 0.4 mg/dL (ref 0.3–1.2)
BUN: 23 mg/dL — AB (ref 6–20)
CALCIUM: 8.7 mg/dL — AB (ref 8.9–10.3)
CO2: 23 mmol/L (ref 22–32)
Chloride: 109 mmol/L (ref 98–111)
Creatinine, Ser: 1.88 mg/dL — ABNORMAL HIGH (ref 0.61–1.24)
GFR calc Af Amer: 43 mL/min — ABNORMAL LOW (ref >60)
GFR calc non Af Amer: 37 mL/min — ABNORMAL LOW (ref >60)
GLUCOSE: 93 mg/dL (ref 70–99)
Potassium: 4.5 mmol/L (ref 3.5–5.1)
SODIUM: 139 mmol/L (ref 135–145)
TOTAL PROTEIN: 6.6 g/dL (ref 6.5–8.1)

## 2018-06-17 LAB — CBC WITH DIFFERENTIAL (CANCER CENTER ONLY)
BASOS ABS: 0 10*3/uL (ref 0.0–0.1)
BASOS PCT: 1 %
EOS ABS: 0 10*3/uL (ref 0.0–0.5)
Eosinophils Relative: 1 %
HCT: 30.2 % — ABNORMAL LOW (ref 38.4–49.9)
HEMOGLOBIN: 10.4 g/dL — AB (ref 13.0–17.1)
LYMPHS ABS: 1 10*3/uL (ref 0.9–3.3)
Lymphocytes Relative: 24 %
MCH: 32.3 pg (ref 27.2–33.4)
MCHC: 34.4 g/dL (ref 32.0–36.0)
MCV: 93.8 fL (ref 79.3–98.0)
Monocytes Absolute: 0.8 10*3/uL (ref 0.1–0.9)
Monocytes Relative: 20 %
NEUTROS PCT: 54 %
Neutro Abs: 2.3 10*3/uL (ref 1.5–6.5)
Platelet Count: 197 10*3/uL (ref 140–400)
RBC: 3.22 MIL/uL — AB (ref 4.20–5.82)
RDW: 14.4 % (ref 11.0–14.6)
WBC Count: 4.2 10*3/uL (ref 4.0–10.3)

## 2018-06-17 MED ORDER — ONDANSETRON HCL 8 MG PO TABS
4.0000 mg | ORAL_TABLET | Freq: Once | ORAL | Status: AC
  Administered 2018-06-17: 4 mg via ORAL

## 2018-06-17 MED ORDER — BORTEZOMIB CHEMO SQ INJECTION 3.5 MG (2.5MG/ML)
1.3000 mg/m2 | Freq: Once | INTRAMUSCULAR | Status: AC
  Administered 2018-06-17: 2.25 mg via SUBCUTANEOUS
  Filled 2018-06-17: qty 0.9

## 2018-06-17 MED ORDER — ONDANSETRON HCL 8 MG PO TABS
ORAL_TABLET | ORAL | Status: AC
  Filled 2018-06-17: qty 1

## 2018-06-17 NOTE — Patient Instructions (Signed)
[facility] Discharge Instructions for Patients Receiving Chemotherapy  Today you received the following chemotherapy agents velcade   To help prevent nausea and vomiting after your treatment, we encourage you to take your nausea medication as directed  If you develop nausea and vomiting that is not controlled by your nausea medication, call the clinic.   BELOW ARE SYMPTOMS THAT SHOULD BE REPORTED IMMEDIATELY:  *FEVER GREATER THAN 100.5 F  *CHILLS WITH OR WITHOUT FEVER  NAUSEA AND VOMITING THAT IS NOT CONTROLLED WITH YOUR NAUSEA MEDICATION  *UNUSUAL SHORTNESS OF BREATH  *UNUSUAL BRUISING OR BLEEDING  TENDERNESS IN MOUTH AND THROAT WITH OR WITHOUT PRESENCE OF ULCERS  *URINARY PROBLEMS  *BOWEL PROBLEMS  UNUSUAL RASH Items with * indicate a potential emergency and should be followed up as soon as possible.  Feel free to call the clinic you have any questions or concerns. The clinic phone number is [phone].  

## 2018-06-17 NOTE — Progress Notes (Signed)
Per Dr. Irene Limbo patient has been stable and is ok to treat before CBC results today.

## 2018-06-17 NOTE — Progress Notes (Signed)
Per Ardyth Harps, RN  [place] with Dr. Irene Limbo to use 7/1 CBC and 7/5 CMET.  New CBC drawn today, but no need to wait.

## 2018-06-28 ENCOUNTER — Inpatient Hospital Stay: Payer: BLUE CROSS/BLUE SHIELD

## 2018-06-28 VITALS — BP 123/90 | HR 79 | Temp 97.9°F | Resp 16

## 2018-06-28 DIAGNOSIS — C9010 Plasma cell leukemia not having achieved remission: Secondary | ICD-10-CM | POA: Diagnosis not present

## 2018-06-28 DIAGNOSIS — Z7189 Other specified counseling: Secondary | ICD-10-CM

## 2018-06-28 DIAGNOSIS — C9000 Multiple myeloma not having achieved remission: Secondary | ICD-10-CM

## 2018-06-28 LAB — COMPREHENSIVE METABOLIC PANEL
ALBUMIN: 3.8 g/dL (ref 3.5–5.0)
ALT: 8 U/L (ref 0–44)
ANION GAP: 9 (ref 5–15)
AST: 10 U/L — ABNORMAL LOW (ref 15–41)
Alkaline Phosphatase: 57 U/L (ref 38–126)
BILIRUBIN TOTAL: 0.4 mg/dL (ref 0.3–1.2)
BUN: 25 mg/dL — ABNORMAL HIGH (ref 8–23)
CALCIUM: 9.1 mg/dL (ref 8.9–10.3)
CO2: 22 mmol/L (ref 22–32)
Chloride: 110 mmol/L (ref 98–111)
Creatinine, Ser: 1.99 mg/dL — ABNORMAL HIGH (ref 0.61–1.24)
GFR calc non Af Amer: 34 mL/min — ABNORMAL LOW (ref >60)
GFR, EST AFRICAN AMERICAN: 40 mL/min — AB (ref >60)
GLUCOSE: 91 mg/dL (ref 70–99)
POTASSIUM: 4.5 mmol/L (ref 3.5–5.1)
SODIUM: 141 mmol/L (ref 135–145)
TOTAL PROTEIN: 6.6 g/dL (ref 6.5–8.1)

## 2018-06-28 LAB — CBC WITH DIFFERENTIAL (CANCER CENTER ONLY)
BASOS PCT: 1 %
Basophils Absolute: 0.1 10*3/uL (ref 0.0–0.1)
EOS ABS: 0.1 10*3/uL (ref 0.0–0.5)
Eosinophils Relative: 3 %
HCT: 29.2 % — ABNORMAL LOW (ref 38.4–49.9)
HEMOGLOBIN: 10 g/dL — AB (ref 13.0–17.1)
LYMPHS ABS: 1 10*3/uL (ref 0.9–3.3)
Lymphocytes Relative: 20 %
MCH: 32.2 pg (ref 27.2–33.4)
MCHC: 34.2 g/dL (ref 32.0–36.0)
MCV: 93.9 fL (ref 79.3–98.0)
Monocytes Absolute: 0.7 10*3/uL (ref 0.1–0.9)
Monocytes Relative: 13 %
NEUTROS PCT: 63 %
Neutro Abs: 3.3 10*3/uL (ref 1.5–6.5)
Platelet Count: 263 10*3/uL (ref 140–400)
RBC: 3.11 MIL/uL — AB (ref 4.20–5.82)
RDW: 13.9 % (ref 11.0–14.6)
WBC: 5.2 10*3/uL (ref 4.0–10.3)

## 2018-06-28 MED ORDER — ONDANSETRON HCL 8 MG PO TABS
ORAL_TABLET | ORAL | Status: AC
  Filled 2018-06-28: qty 1

## 2018-06-28 MED ORDER — BORTEZOMIB CHEMO SQ INJECTION 3.5 MG (2.5MG/ML)
1.3000 mg/m2 | Freq: Once | INTRAMUSCULAR | Status: AC
  Administered 2018-06-28: 2.25 mg via SUBCUTANEOUS
  Filled 2018-06-28: qty 0.9

## 2018-06-28 MED ORDER — ONDANSETRON HCL 8 MG PO TABS
4.0000 mg | ORAL_TABLET | Freq: Once | ORAL | Status: AC
  Administered 2018-06-28: 4 mg via ORAL

## 2018-06-28 NOTE — Progress Notes (Signed)
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 His labs today 12/14/2017 show hgb 8.3, platelets 524k. He is doing well overall. He has staples in his right upper leg following a recent surgery and has not been in touch with the surgeons for a f/u of this. He is not receiving physical therapy and is not getting around at home.  He notes that he was not scheduled for a post hospitalization nephrology f/u and has no PCP.  On review of systems, pt reports intermittent fatigue, SOB, pain to the left leg, dizziness, dry mouth and denies fever, chills, night sweats, dysuria and any other accompanying symptoms.   INTERVAL HISTORY:    Andres Fernandez presents today for follow up and C12 of treatment of his myeloma/plasma cell leukemia. The patient's last visit with [name] was on 06/10/18. He presents to the clinic today noting he should be done with his treatment Andres Fernandez on 7/29 and recently saw Dr. Linus Salmons. His latest Hep C test was undetectable on 05/27/18. He notes he is concerned about the BM transplant and high dose chemo but is willing to learn more about his options.  He notes he had chills 2 days ago but has since resolved. He is overall tolerating current Myeloma treatment. He has been eating and being active fairly. He denies any neuropathy. He notes injection shot bruising being tender on the right abdomen but no concerns.    REVIEW OF SYSTEMS:   A 10+ POINT REVIEW OF SYSTEMS WAS OBTAINED including neurology, dermatology, psychiatry, cardiac, respiratory, lymph, extremities, GI, GU, Musculoskeletal, constitutional, breasts, reproductive, HEENT.  All pertinent positives are noted in the HPI.  All others are negative.    MEDICAL HISTORY:  Past Medical  History:  Diagnosis Date  . BPH (benign prostatic hyperplasia)   . Hepatitis C 11/27/2017  . Hypertension   . Plasma cell leukemia ([place]) 11/23/2017  . Tobacco abuse     SURGICAL HISTORY: Past Surgical History:  Procedure Laterality Date  . APPENDECTOMY    . FEMUR IM NAIL Right 11/20/2017   Procedure: RIGHT FEMORAL INTRAMEDULLARY (IM) NAIL;  Surgeon: Nicholes Stairs, MD;  Location: [facility];  Service: Orthopedics;  Laterality: Right;    SOCIAL HISTORY: Social History   Socioeconomic History  . Marital status: Single    Spouse name: Not on file  . Number of children: Not on file  . Years of education: Not on file  . Highest education level: Not on file  Occupational History  . Not on file  Social Needs  . Financial resource strain: Not on file  . Food insecurity:    Worry: Not on file    Inability: Not on file  . Transportation needs:    Medical: Not on file    Non-medical: Not on file  Tobacco Use  . Smoking status: Current Every Day Smoker    Packs/day: 0.25    Types: Cigarettes    Last attempt to quit: 11/09/2017    Years since quitting: 0.6  . Smokeless tobacco: Never Used  Substance and Sexual Activity  . Alcohol use: Yes  . Drug use: No  . Sexual activity: Not on file  Lifestyle  . Physical activity:    Days per week: Not on file  Minutes per session: Not on file  . Stress: Not on file  Relationships  . Social connections:    Talks on phone: Not on file    Gets together: Not on file    Attends religious service: Not on file    Active member of club or organization: Not on file    Attends meetings of clubs or organizations: Not on file    Relationship status: Not on file  . Intimate partner violence:    Fear of current or ex partner: Not on file    Emotionally abused: Not on file    Physically abused: Not on file    Forced sexual activity: Not on file  Other Topics Concern  . Not on file  Social History Narrative  . Not on file    FAMILY  HISTORY: Family History  Problem Relation Age of Onset  . COPD Mother   . Liver cancer Mother     ALLERGIES:  has No Known Allergies.  MEDICATIONS:  . Current Outpatient Medications on File Prior to Visit  Medication Sig Dispense Refill  . acyclovir (ZOVIRAX) 400 MG tablet Take 1 tablet (400 mg total) by mouth daily. 30 tablet 6  . cholecalciferol (VITAMIN D) 400 units TABS tablet Take 400 Units by mouth daily.    . cyclophosphamide (CYTOXAN) 50 MG capsule Take 11 caps (558m) by mouth once weekly on days 1, 8, &15, every 21 days. Take with breakfast, maintain hydration. 33 capsule 1  . dexamethasone (DECADRON) 4 MG tablet Take 5 tablets (20 mg total) by mouth once a week. Every Saturday morning 20 tablet 1  . Glecaprevir-Pibrentasvir (MAVYRET) 100-40 MG TABS Take 3 tablets by mouth daily with supper. 84 tablet 1  . nicotine (NICODERM CQ - DOSED IN MG/24 HOURS) 14 mg/24hr patch PLACE 1 PATCH (14 MG TOTAL) ONTO THE SKIN DAILY. 28 patch 1  . ondansetron (ZOFRAN) 4 MG tablet Take 2 tablets (8 mg total) by mouth every 8 (eight) hours as needed for nausea. 30 tablet 0  . traZODone (DESYREL) 50 MG tablet TAKE 1 TABLET BY MOUTH EVERYDAY AT BEDTIME 30 tablet 0   No current facility-administered medications on file prior to visit.     PHYSICAL EXAMINATION:  VS reviewed Vitals:   07/02/18 0840  BP: 139/86  Pulse: 83  Resp: 16  Temp: 98.5 F (36.9 C)  SpO2: 100%     GENERAL:alert, in no acute distress and comfortable SKIN: no acute rashes, no significant lesions EYES: conjunctiva are pink and non-injected, sclera anicteric OROPHARYNX: MMM, no exudates, no oropharyngeal erythema or ulceration NECK: supple, no JVD LYMPH:  no palpable lymphadenopathy in the cervical, axillary or inguinal regions LUNGS: clear to auscultation b/l with normal respiratory effort HEART: regular rate & rhythm ABDOMEN:  normoactive bowel sounds , non tender, not distended. Extremity: no pedal edema PSYCH:  alert & oriented x 3 with fluent speech NEURO: no focal motor/sensory deficits   LABORATORY DATA:  I have reviewed the data as listed  . CBC Latest Ref Rng & Units 07/02/2018 06/28/2018 06/17/2018  WBC 4.0 - 10.3 K/uL 4.8 5.2 4.2  Hemoglobin 13.0 - 17.1 g/dL 10.4(L) 10.0(L) 10.4(L)  Hematocrit 38.4 - 49.9 % 30.9(L) 29.2(L) 30.2(L)  Platelets 140 - 400 K/uL 277 263 197   . CBC    Component Value Date/Time   WBC 4.8 07/02/2018 0816   WBC 4.2 05/24/2018 0825   RBC 3.21 (L) 07/02/2018 0816   HGB 10.4 (L) 07/02/2018 06962  HGB 8.3 (L) 12/14/2017 1230   HCT 30.9 (L) 07/02/2018 0816   HCT 23.9 (L) 12/14/2017 1230   PLT 277 07/02/2018 0816   PLT 524 (H) 12/14/2017 1230   MCV 96.5 07/02/2018 0816   MCV 88.4 12/14/2017 1230   MCH 32.4 07/02/2018 0816   MCHC 33.6 07/02/2018 0816   RDW 14.4 07/02/2018 0816   RDW 15.3 (H) 12/14/2017 1230   LYMPHSABS 0.6 (L) 07/02/2018 0816   LYMPHSABS 1.4 12/14/2017 1230   MONOABS 1.0 (H) 07/02/2018 0816   MONOABS 0.6 12/14/2017 1230   EOSABS 0.1 07/02/2018 0816   EOSABS 0.1 12/14/2017 1230   BASOSABS 0.0 07/02/2018 0816   BASOSABS 0.1 12/14/2017 1230     . CMP Latest Ref Rng & Units 07/08/2018 07/05/2018 07/02/2018  Glucose 70 - 99 mg/dL 114(H) 93 97  BUN 8 - 23 mg/dL 23 22 24(H)  Creatinine 0.61 - 1.24 mg/dL 1.96(H) 1.99(H) 1.82(H)  Sodium 135 - 145 mmol/L 141 144 144  Potassium 3.5 - 5.1 mmol/L 4.0 4.5 4.4  Chloride 98 - 111 mmol/L 110 109 112(H)  CO2 22 - 32 mmol/L _0 Calcium 8.9 - 10.3 mg/dL 8.9 8.7(L) 9.0  Total Protein 6.5 - 8.1 g/dL 6.7 7.0 6.5  Total Bilirubin 0.3 - 1.2 mg/dL 0.3 0.3 0.4  Alkaline Phos 38 - 126 U/L 58 64 57  AST 15 - 41 U/L 11(L) 12(L) 10(L)  ALT 0 - 44 U/L _1 RADIOGRAPHIC STUDIES: I have personally reviewed the radiological images as listed and agreed with the findings in the report. No results found.  ASSESSMENT & PLAN:   62 y.o.A male presenting with severe hypercalcemia,  skeletal findings consistent with myeloma and significant leukocytosis/lymphocytosis in the peripheral blood as well as AKI and hypercalcemia.  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 -Hypercalcemia -Renal Failure -Anemia -Extensive Bone lesions -Appears to be primarily Kappa Light chain Myeloma with no overt M spike. Appears to be responding to treatment.- with decreased kappa light chains.   BM Bx showed >95% involvement with kappa restricted serum free light chains. 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 MoL Cy-    PLAN  -I discussed his kappa/lambda is plateau and it may be time to alter treatment regimen or proceed with BM transplant to continue to control his disease. He is hesitant about transplant and high dose chemotherapy.  -I recommend he discuss this option further with Dr. Norma Fredrickson at [facility].  -For now we will continue with CyBorD until myeloma and kappa/lambda free light chains cease to respond or patient achieves CR -Will continue to monitor peripheral neuropathy while pt takes Velcade -Continue Xgeva every 4 weeks, Velcade  -Follow up with PCP Dr Christa See  -continue Acyclovir prophylaxis. -Continue weekly labs on D1 AND D8 each cycle -Discussed pt labwork today, 06/10/18; HGB stable at 10.4, Hep C RNA from 05/27/18 was undetectable  -Discussed that the pt should consider what his goals of care will be, including BM transplant, or consideration of second line treatments if CR is not achieved vs maintenance therapy. -The pt has no prohibitive toxicities from continuing C12 of CyBorD SQ q21d at this time.    2) s/p Prophylactic IM nailing for rt femur  -She [facility].  -patient previously reported improvement and is currently progressively back to full time work.  3) Thrombocytopenia resolved    4) Renal insufficiency -- likely primarily related  to Myeloma multifactorial MM/TLS/sepsis/antibiotics.Primary light chain nephrology Plan -maintain follow up with nephrology referral to Dr Marval Regal [facility] for continued treatment -Pt will complete 8 weeks of Andres Fernandez for hep c with Dr Linus Salmons on 07/08/18.  -Kidney numbers improved. 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 -Hgb improved to 10.4   Please schedule next cycle of CyBorD as per orders RTC with Dr Irene Limbo in 4 weeks Labs D1 and D8 of each  Continue Xgeva q4weeks      . The total time spent in the appointment was 25 minutes and more than 50% was on counseling and direct patient cares.   Sullivan Lone MD MS AAHIVMS [facility] Hematology/Oncology Physician [facility]  (Office):       [phone] (Work cell):  [phone] (Fax):           [phone]  I, Joslyn Devon, am acting as scribe for Sullivan Lone, MD.    .I have reviewed the above documentation for accuracy and completeness, and I agree with the above.    Brunetta Genera MD

## 2018-06-28 NOTE — Progress Notes (Signed)
Per Dr. Irene Limbo, ok to treat before CBC results today.  Per Dr. Irene Limbo, ok to treat with creatinine of 1.99

## 2018-06-28 NOTE — Patient Instructions (Signed)
[facility] Discharge Instructions for Patients Receiving Chemotherapy  Today you received the following chemotherapy agents Veldade  To help prevent nausea and vomiting after your treatment, we encourage you to take your nausea medication as directed   If you develop nausea and vomiting that is not controlled by your nausea medication, call the clinic.   BELOW ARE SYMPTOMS THAT SHOULD BE REPORTED IMMEDIATELY:  *FEVER GREATER THAN 100.5 F  *CHILLS WITH OR WITHOUT FEVER  NAUSEA AND VOMITING THAT IS NOT CONTROLLED WITH YOUR NAUSEA MEDICATION  *UNUSUAL SHORTNESS OF BREATH  *UNUSUAL BRUISING OR BLEEDING  TENDERNESS IN MOUTH AND THROAT WITH OR WITHOUT PRESENCE OF ULCERS  *URINARY PROBLEMS  *BOWEL PROBLEMS  UNUSUAL RASH Items with * indicate a potential emergency and should be followed up as soon as possible.  Feel free to call the clinic should you have any questions or concerns. The clinic phone number is [phone].  Please show the [place] at check-in to the Emergency Department and triage nurse.

## 2018-07-01 ENCOUNTER — Other Ambulatory Visit: Payer: Self-pay | Admitting: Hematology

## 2018-07-01 ENCOUNTER — Other Ambulatory Visit: Payer: Self-pay

## 2018-07-01 DIAGNOSIS — C9000 Multiple myeloma not having achieved remission: Secondary | ICD-10-CM

## 2018-07-02 ENCOUNTER — Inpatient Hospital Stay: Payer: BLUE CROSS/BLUE SHIELD

## 2018-07-02 ENCOUNTER — Inpatient Hospital Stay (HOSPITAL_BASED_OUTPATIENT_CLINIC_OR_DEPARTMENT_OTHER): Payer: BLUE CROSS/BLUE SHIELD | Admitting: Hematology

## 2018-07-02 ENCOUNTER — Encounter: Payer: Self-pay | Admitting: Hematology

## 2018-07-02 ENCOUNTER — Ambulatory Visit: Payer: BLUE CROSS/BLUE SHIELD

## 2018-07-02 VITALS — BP 139/86 | HR 83 | Temp 98.5°F | Resp 16 | Wt 166.1 lb

## 2018-07-02 VITALS — BP 133/85 | HR 75 | Temp 97.9°F | Resp 16

## 2018-07-02 DIAGNOSIS — R0602 Shortness of breath: Secondary | ICD-10-CM

## 2018-07-02 DIAGNOSIS — C9010 Plasma cell leukemia not having achieved remission: Secondary | ICD-10-CM

## 2018-07-02 DIAGNOSIS — Z79899 Other long term (current) drug therapy: Secondary | ICD-10-CM

## 2018-07-02 DIAGNOSIS — B1920 Unspecified viral hepatitis C without hepatic coma: Secondary | ICD-10-CM

## 2018-07-02 DIAGNOSIS — F1411 Cocaine abuse, in remission: Secondary | ICD-10-CM

## 2018-07-02 DIAGNOSIS — Z7189 Other specified counseling: Secondary | ICD-10-CM

## 2018-07-02 DIAGNOSIS — F1011 Alcohol abuse, in remission: Secondary | ICD-10-CM

## 2018-07-02 DIAGNOSIS — N289 Disorder of kidney and ureter, unspecified: Secondary | ICD-10-CM

## 2018-07-02 DIAGNOSIS — N400 Enlarged prostate without lower urinary tract symptoms: Secondary | ICD-10-CM

## 2018-07-02 DIAGNOSIS — M899 Disorder of bone, unspecified: Secondary | ICD-10-CM

## 2018-07-02 DIAGNOSIS — C9000 Multiple myeloma not having achieved remission: Secondary | ICD-10-CM

## 2018-07-02 DIAGNOSIS — R5383 Other fatigue: Secondary | ICD-10-CM

## 2018-07-02 DIAGNOSIS — F17210 Nicotine dependence, cigarettes, uncomplicated: Secondary | ICD-10-CM

## 2018-07-02 DIAGNOSIS — G629 Polyneuropathy, unspecified: Secondary | ICD-10-CM

## 2018-07-02 DIAGNOSIS — D638 Anemia in other chronic diseases classified elsewhere: Secondary | ICD-10-CM | POA: Diagnosis not present

## 2018-07-02 DIAGNOSIS — R42 Dizziness and giddiness: Secondary | ICD-10-CM

## 2018-07-02 DIAGNOSIS — I10 Essential (primary) hypertension: Secondary | ICD-10-CM

## 2018-07-02 LAB — CBC WITH DIFFERENTIAL (CANCER CENTER ONLY)
BASOS PCT: 1 %
Basophils Absolute: 0 10*3/uL (ref 0.0–0.1)
Eosinophils Absolute: 0.1 10*3/uL (ref 0.0–0.5)
Eosinophils Relative: 2 %
HEMATOCRIT: 30.9 % — AB (ref 38.4–49.9)
HEMOGLOBIN: 10.4 g/dL — AB (ref 13.0–17.1)
LYMPHS ABS: 0.6 10*3/uL — AB (ref 0.9–3.3)
Lymphocytes Relative: 14 %
MCH: 32.4 pg (ref 27.2–33.4)
MCHC: 33.6 g/dL (ref 32.0–36.0)
MCV: 96.5 fL (ref 79.3–98.0)
MONOS PCT: 20 %
Monocytes Absolute: 1 10*3/uL — ABNORMAL HIGH (ref 0.1–0.9)
NEUTROS ABS: 3.1 10*3/uL (ref 1.5–6.5)
NEUTROS PCT: 63 %
PLATELETS: 277 10*3/uL (ref 140–400)
RBC: 3.21 MIL/uL — ABNORMAL LOW (ref 4.20–5.82)
RDW: 14.4 % (ref 11.0–14.6)
WBC Count: 4.8 10*3/uL (ref 4.0–10.3)

## 2018-07-02 LAB — COMPREHENSIVE METABOLIC PANEL
ALT: 8 U/L (ref 0–44)
AST: 10 U/L — AB (ref 15–41)
Albumin: 3.8 g/dL (ref 3.5–5.0)
Alkaline Phosphatase: 57 U/L (ref 38–126)
Anion gap: 8 (ref 5–15)
BUN: 24 mg/dL — AB (ref 8–23)
CHLORIDE: 112 mmol/L — AB (ref 98–111)
CO2: 24 mmol/L (ref 22–32)
CREATININE: 1.82 mg/dL — AB (ref 0.61–1.24)
Calcium: 9 mg/dL (ref 8.9–10.3)
GFR calc Af Amer: 45 mL/min — ABNORMAL LOW (ref >60)
GFR calc non Af Amer: 38 mL/min — ABNORMAL LOW (ref >60)
Glucose, Bld: 97 mg/dL (ref 70–99)
Potassium: 4.4 mmol/L (ref 3.5–5.1)
SODIUM: 144 mmol/L (ref 135–145)
Total Bilirubin: 0.4 mg/dL (ref 0.3–1.2)
Total Protein: 6.5 g/dL (ref 6.5–8.1)

## 2018-07-02 MED ORDER — BORTEZOMIB CHEMO SQ INJECTION 3.5 MG (2.5MG/ML)
1.3000 mg/m2 | Freq: Once | INTRAMUSCULAR | Status: AC
  Administered 2018-07-02: 2.25 mg via SUBCUTANEOUS
  Filled 2018-07-02: qty 0.9

## 2018-07-02 MED ORDER — ONDANSETRON HCL 8 MG PO TABS
ORAL_TABLET | ORAL | Status: AC
  Filled 2018-07-02: qty 1

## 2018-07-02 MED ORDER — ONDANSETRON HCL 8 MG PO TABS
4.0000 mg | ORAL_TABLET | Freq: Once | ORAL | Status: AC
  Administered 2018-07-02: 4 mg via ORAL

## 2018-07-02 NOTE — Patient Instructions (Signed)

## 2018-07-03 ENCOUNTER — Telehealth: Payer: Self-pay | Admitting: Hematology

## 2018-07-03 ENCOUNTER — Other Ambulatory Visit: Payer: BLUE CROSS/BLUE SHIELD

## 2018-07-03 ENCOUNTER — Ambulatory Visit: Payer: BLUE CROSS/BLUE SHIELD

## 2018-07-03 ENCOUNTER — Ambulatory Visit: Payer: BLUE CROSS/BLUE SHIELD | Admitting: Hematology

## 2018-07-03 NOTE — Telephone Encounter (Signed)
Added additional appointments starting 8/9 per 7/23 los. Existing appointments remain the same. Left message for patient - patient to get updated schedule at next visit 7/26. Per los appointments scheduled according to care plan with labs day 1&8 of treatment. Patient remains as scheduled for injection regimen.

## 2018-07-05 ENCOUNTER — Ambulatory Visit: Payer: BLUE CROSS/BLUE SHIELD

## 2018-07-05 ENCOUNTER — Inpatient Hospital Stay: Payer: BLUE CROSS/BLUE SHIELD

## 2018-07-05 ENCOUNTER — Other Ambulatory Visit: Payer: Self-pay | Admitting: Hematology

## 2018-07-05 ENCOUNTER — Other Ambulatory Visit: Payer: Self-pay | Admitting: *Deleted

## 2018-07-05 VITALS — BP 136/92 | HR 86 | Temp 98.2°F | Resp 16

## 2018-07-05 DIAGNOSIS — C9010 Plasma cell leukemia not having achieved remission: Secondary | ICD-10-CM

## 2018-07-05 DIAGNOSIS — M84551A Pathological fracture in neoplastic disease, right femur, initial encounter for fracture: Secondary | ICD-10-CM

## 2018-07-05 DIAGNOSIS — Z7189 Other specified counseling: Secondary | ICD-10-CM

## 2018-07-05 DIAGNOSIS — C9000 Multiple myeloma not having achieved remission: Secondary | ICD-10-CM

## 2018-07-05 LAB — COMPREHENSIVE METABOLIC PANEL
ALBUMIN: 4 g/dL (ref 3.5–5.0)
ALK PHOS: 64 U/L (ref 38–126)
ALT: 8 U/L (ref 0–44)
AST: 12 U/L — AB (ref 15–41)
Anion gap: 9 (ref 5–15)
BILIRUBIN TOTAL: 0.3 mg/dL (ref 0.3–1.2)
BUN: 22 mg/dL (ref 8–23)
CALCIUM: 8.7 mg/dL — AB (ref 8.9–10.3)
CO2: 26 mmol/L (ref 22–32)
CREATININE: 1.99 mg/dL — AB (ref 0.61–1.24)
Chloride: 109 mmol/L (ref 98–111)
GFR calc Af Amer: 40 mL/min — ABNORMAL LOW (ref >60)
GFR calc non Af Amer: 34 mL/min — ABNORMAL LOW (ref >60)
GLUCOSE: 93 mg/dL (ref 70–99)
Potassium: 4.5 mmol/L (ref 3.5–5.1)
SODIUM: 144 mmol/L (ref 135–145)
Total Protein: 7 g/dL (ref 6.5–8.1)

## 2018-07-05 MED ORDER — DENOSUMAB 120 MG/1.7ML ~~LOC~~ SOLN
SUBCUTANEOUS | Status: AC
  Filled 2018-07-05: qty 1.7

## 2018-07-05 MED ORDER — BORTEZOMIB CHEMO SQ INJECTION 3.5 MG (2.5MG/ML)
1.3000 mg/m2 | Freq: Once | INTRAMUSCULAR | Status: AC
  Administered 2018-07-05: 2.25 mg via SUBCUTANEOUS
  Filled 2018-07-05: qty 0.9

## 2018-07-05 MED ORDER — DENOSUMAB 120 MG/1.7ML ~~LOC~~ SOLN
120.0000 mg | Freq: Once | SUBCUTANEOUS | Status: AC
  Administered 2018-07-05: 120 mg via SUBCUTANEOUS

## 2018-07-05 MED ORDER — ONDANSETRON HCL 8 MG PO TABS
4.0000 mg | ORAL_TABLET | Freq: Once | ORAL | Status: AC
  Administered 2018-07-05: 4 mg via ORAL

## 2018-07-05 MED ORDER — ONDANSETRON HCL 8 MG PO TABS
ORAL_TABLET | ORAL | Status: AC
  Filled 2018-07-05: qty 1

## 2018-07-05 NOTE — Progress Notes (Signed)
Dr. Irene Limbo ok to proceed with xgeva despite Ca 8.7.

## 2018-07-05 NOTE — Progress Notes (Signed)
Dr. Irene Limbo ok to tx with ctn 1.82 and labs from 7/23.

## 2018-07-05 NOTE — Patient Instructions (Signed)
[facility] Discharge Instructions for Patients Receiving Chemotherapy  Today you received the following chemotherapy agents bortezomib (Velcade)  To help prevent nausea and vomiting after your treatment, we encourage you to take your nausea medication as directed by your doctor.   If you develop nausea and vomiting that is not controlled by your nausea medication, call the clinic.   BELOW ARE SYMPTOMS THAT SHOULD BE REPORTED IMMEDIATELY:  *FEVER GREATER THAN 100.5 F  *CHILLS WITH OR WITHOUT FEVER  NAUSEA AND VOMITING THAT IS NOT CONTROLLED WITH YOUR NAUSEA MEDICATION  *UNUSUAL SHORTNESS OF BREATH  *UNUSUAL BRUISING OR BLEEDING  TENDERNESS IN MOUTH AND THROAT WITH OR WITHOUT PRESENCE OF ULCERS  *URINARY PROBLEMS  *BOWEL PROBLEMS  UNUSUAL RASH Items with * indicate a potential emergency and should be followed up as soon as possible.  Feel free to call the clinic should you have any questions or concerns. The clinic phone number is [phone].  Please show the [facility] at check-in to the Emergency Department and triage nurse.  Denosumab injection What is this medicine? DENOSUMAB (den oh sue mab) slows bone breakdown. Prolia is used to treat osteoporosis in women after menopause and in men. Delton See is used to treat a high calcium level due to cancer and to prevent bone fractures and other bone problems caused by multiple myeloma or cancer bone metastases. Delton See is also used to treat giant cell tumor of the bone. This medicine may be used for other purposes; ask your health care provider or pharmacist if you have questions. COMMON BRAND NAME(S): Prolia, XGEVA What should I tell my health care provider before I take this medicine? They need to know if you have any of these conditions: -dental disease -having surgery or tooth extraction -infection -kidney disease -low levels of calcium or Vitamin D in the blood -malnutrition -on hemodialysis -skin  conditions or sensitivity -thyroid or parathyroid disease -an unusual reaction to denosumab, other medicines, foods, dyes, or preservatives -pregnant or trying to get pregnant -breast-feeding How should I use this medicine? This medicine is for injection under the skin. It is given by a health care professional in a hospital or clinic setting. If you are getting Prolia, a special MedGuide will be given to you by the pharmacist with each prescription and refill. Be sure to read this information carefully each time. For Prolia, talk to your pediatrician regarding the use of this medicine in children. Special care may be needed. For Delton See, talk to your pediatrician regarding the use of this medicine in children. While this drug may be prescribed for children as young as 13 years for selected conditions, precautions do apply. Overdosage: If you think you have taken too much of this medicine contact a poison control center or emergency room at once. NOTE: This medicine is only for you. Do not share this medicine with others. What if I miss a dose? It is important not to miss your dose. Call your doctor or health care professional if you are unable to keep an appointment. What may interact with this medicine? Do not take this medicine with any of the following medications: -other medicines containing denosumab This medicine may also interact with the following medications: -medicines that lower your chance of fighting infection -steroid medicines like prednisone or cortisone This list may not describe all possible interactions. Give your health care provider a list of all the medicines, herbs, non-prescription drugs, or dietary supplements you use. Also tell them if you smoke, drink  alcohol, or use illegal drugs. Some items may interact with your medicine. What should I watch for while using this medicine? Visit your doctor or health care professional for regular checks on your progress. Your doctor  or health care professional may order blood tests and other tests to see how you are doing. Call your doctor or health care professional for advice if you get a fever, chills or sore throat, or other symptoms of a cold or flu. Do not treat yourself. This drug may decrease your body's ability to fight infection. Try to avoid being around people who are sick. You should make sure you get enough calcium and vitamin D while you are taking this medicine, unless your doctor tells you not to. Discuss the foods you eat and the vitamins you take with your health care professional. See your dentist regularly. Brush and floss your teeth as directed. Before you have any dental work done, tell your dentist you are receiving this medicine. Do not become pregnant while taking this medicine or for 5 months after stopping it. Talk with your doctor or health care professional about your birth control options while taking this medicine. Women should inform their doctor if they wish to become pregnant or think they might be pregnant. There is a potential for serious side effects to an unborn child. Talk to your health care professional or pharmacist for more information. What side effects may I notice from receiving this medicine? Side effects that you should report to your doctor or health care professional as soon as possible: -allergic reactions like skin rash, itching or hives, swelling of the face, lips, or tongue -bone pain -breathing problems -dizziness -jaw pain, especially after dental work -redness, blistering, peeling of the skin -signs and symptoms of infection like fever or chills; cough; sore throat; pain or trouble passing urine -signs of low calcium like fast heartbeat, muscle cramps or muscle pain; pain, tingling, numbness in the hands or feet; seizures -unusual bleeding or bruising -unusually weak or tired Side effects that usually do not require medical attention (report to your doctor or health care  professional if they continue or are bothersome): -constipation -diarrhea -headache -joint pain -loss of appetite -muscle pain -runny nose -tiredness -upset stomach This list may not describe all possible side effects. Call your doctor for medical advice about side effects. You may report side effects to FDA at 1-800-FDA-1088. Where should I keep my medicine? This medicine is only given in a clinic, doctor's office, or other health care setting and will not be stored at home. NOTE: This sheet is a summary. It may not cover all possible information. If you have questions about this medicine, talk to your doctor, pharmacist, or health care provider.  2018 Elsevier/Gold Standard (2016-12-19 19:17:21)

## 2018-07-08 ENCOUNTER — Inpatient Hospital Stay: Payer: BLUE CROSS/BLUE SHIELD

## 2018-07-08 ENCOUNTER — Telehealth: Payer: Self-pay

## 2018-07-08 ENCOUNTER — Ambulatory Visit (INDEPENDENT_AMBULATORY_CARE_PROVIDER_SITE_OTHER): Payer: BLUE CROSS/BLUE SHIELD | Admitting: Pharmacist

## 2018-07-08 VITALS — BP 119/78 | HR 83 | Temp 98.5°F | Resp 16

## 2018-07-08 DIAGNOSIS — C9000 Multiple myeloma not having achieved remission: Secondary | ICD-10-CM

## 2018-07-08 DIAGNOSIS — C9010 Plasma cell leukemia not having achieved remission: Secondary | ICD-10-CM

## 2018-07-08 DIAGNOSIS — Z7189 Other specified counseling: Secondary | ICD-10-CM

## 2018-07-08 DIAGNOSIS — B182 Chronic viral hepatitis C: Secondary | ICD-10-CM | POA: Diagnosis not present

## 2018-07-08 LAB — COMPREHENSIVE METABOLIC PANEL
ALBUMIN: 3.9 g/dL (ref 3.5–5.0)
ALK PHOS: 58 U/L (ref 38–126)
ALT: 10 U/L (ref 0–44)
AST: 11 U/L — ABNORMAL LOW (ref 15–41)
Anion gap: 9 (ref 5–15)
BUN: 23 mg/dL (ref 8–23)
CO2: 22 mmol/L (ref 22–32)
Calcium: 8.9 mg/dL (ref 8.9–10.3)
Chloride: 110 mmol/L (ref 98–111)
Creatinine, Ser: 1.96 mg/dL — ABNORMAL HIGH (ref 0.61–1.24)
GFR calc Af Amer: 41 mL/min — ABNORMAL LOW (ref >60)
GFR calc non Af Amer: 35 mL/min — ABNORMAL LOW (ref >60)
GLUCOSE: 114 mg/dL — AB (ref 70–99)
POTASSIUM: 4 mmol/L (ref 3.5–5.1)
SODIUM: 141 mmol/L (ref 135–145)
Total Bilirubin: 0.3 mg/dL (ref 0.3–1.2)
Total Protein: 6.7 g/dL (ref 6.5–8.1)

## 2018-07-08 LAB — CBC WITH DIFFERENTIAL (CANCER CENTER ONLY)
BASOS ABS: 0 10*3/uL (ref 0.0–0.1)
BASOS PCT: 0 %
EOS ABS: 0 10*3/uL (ref 0.0–0.5)
Eosinophils Relative: 1 %
HCT: 31.9 % — ABNORMAL LOW (ref 38.4–49.9)
HEMOGLOBIN: 11 g/dL — AB (ref 13.0–17.1)
Lymphocytes Relative: 16 %
Lymphs Abs: 0.7 10*3/uL — ABNORMAL LOW (ref 0.9–3.3)
MCH: 32.5 pg (ref 27.2–33.4)
MCHC: 34.5 g/dL (ref 32.0–36.0)
MCV: 94.4 fL (ref 79.3–98.0)
MONOS PCT: 26 %
Monocytes Absolute: 1.1 10*3/uL — ABNORMAL HIGH (ref 0.1–0.9)
Neutro Abs: 2.4 10*3/uL (ref 1.5–6.5)
Neutrophils Relative %: 57 %
Platelet Count: 156 10*3/uL (ref 140–400)
RBC: 3.38 MIL/uL — ABNORMAL LOW (ref 4.20–5.82)
RDW: 13.8 % (ref 11.0–14.6)
WBC: 4.3 10*3/uL (ref 4.0–10.3)

## 2018-07-08 LAB — KAPPA/LAMBDA LIGHT CHAINS
KAPPA FREE LGHT CHN: 67.6 mg/L — AB (ref 3.3–19.4)
KAPPA, LAMDA LIGHT CHAIN RATIO: 6.9 — AB (ref 0.26–1.65)
Lambda free light chains: 9.8 mg/L (ref 5.7–26.3)

## 2018-07-08 MED ORDER — ONDANSETRON HCL 8 MG PO TABS
ORAL_TABLET | ORAL | Status: AC
  Filled 2018-07-08: qty 1

## 2018-07-08 MED ORDER — ONDANSETRON HCL 8 MG PO TABS
4.0000 mg | ORAL_TABLET | Freq: Once | ORAL | Status: AC
  Administered 2018-07-08: 4 mg via ORAL

## 2018-07-08 MED ORDER — BORTEZOMIB CHEMO SQ INJECTION 3.5 MG (2.5MG/ML)
1.3000 mg/m2 | Freq: Once | INTRAMUSCULAR | Status: AC
Start: 2018-07-08 — End: 2018-07-08
  Administered 2018-07-08: 2.25 mg via SUBCUTANEOUS
  Filled 2018-07-08: qty 0.9

## 2018-07-08 NOTE — Telephone Encounter (Signed)
Spoke with patient concerning a lab added on to his appointment for today. He is currently on his way from [place]),. Will also inform Ms. Wilma, and labs. Per 7/25 HP sch msg

## 2018-07-08 NOTE — Patient Instructions (Signed)
[facility] Discharge Instructions for Patients Receiving Chemotherapy  Today you received the following chemotherapy agents velcade   To help prevent nausea and vomiting after your treatment, we encourage you to take your nausea medication as directed  If you develop nausea and vomiting that is not controlled by your nausea medication, call the clinic.   BELOW ARE SYMPTOMS THAT SHOULD BE REPORTED IMMEDIATELY:  *FEVER GREATER THAN 100.5 F  *CHILLS WITH OR WITHOUT FEVER  NAUSEA AND VOMITING THAT IS NOT CONTROLLED WITH YOUR NAUSEA MEDICATION  *UNUSUAL SHORTNESS OF BREATH  *UNUSUAL BRUISING OR BLEEDING  TENDERNESS IN MOUTH AND THROAT WITH OR WITHOUT PRESENCE OF ULCERS  *URINARY PROBLEMS  *BOWEL PROBLEMS  UNUSUAL RASH Items with * indicate a potential emergency and should be followed up as soon as possible.  Feel free to call the clinic you have any questions or concerns. The clinic phone number is [phone].  

## 2018-07-08 NOTE — Progress Notes (Signed)
HPI: Andres Fernandez is a 62 y.o. male who presents to the [place] clinic for his EOT Hep C follow-up.  Hepatitis C Genotype: 2  Fibrosis Score: F2/F3  Hepatitis C RNA: 10.2 million, <15  Patient Active Problem List   Diagnosis Date Noted  . Counseling regarding advanced care planning and goals of care 02/14/2018  . 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 Malnutrition of moderate degree 12/02/2017  . AKI (acute kidney injury) ([place]) 11/27/2017  . SIRS (systemic inflammatory response syndrome) ([place]) 11/27/2017  . Anemia 11/27/2017  . Thrombocytopenia ([place]) 11/27/2017  . Chronic hepatitis C without hepatic coma ([place]) 11/27/2017  . Cancer associated pain   . Tumor lysis syndrome   . Encounter for antineoplastic chemotherapy   . Plasma cell leukemia ([place]) 11/23/2017  . Pathological fracture in neoplastic disease, right femur, initial encounter for fracture ([place]) 11/23/2017  . Lytic bone lesion of right femur 11/20/2017  . Acute renal failure ([place]) 11/17/2017  . ?? Multiple myeloma vs other bone marrow malignancy 11/17/2017  . Hypercalcemia 11/17/2017  . BPH (benign prostatic hyperplasia) 11/17/2017  . Tobacco abuse 11/17/2017  . Elevated blood-pressure reading without diagnosis of hypertension 11/17/2017  . Lytic bone lesions on xray 11/17/2017    Patient's Medications  New Prescriptions   No medications on file  Previous Medications   ACYCLOVIR (ZOVIRAX) 400 MG TABLET    Take 1 tablet (400 mg total) by mouth daily.   CHOLECALCIFEROL (VITAMIN D) 400 UNITS TABS TABLET    Take 400 Units by mouth daily.   CYCLOPHOSPHAMIDE (CYTOXAN) 50 MG CAPSULE    Take 11 caps (522m) by mouth once weekly on days 1, 8, &15, every 21 days. Take with breakfast, maintain hydration.   DEXAMETHASONE (DECADRON) 4 MG TABLET    Take 5 tablets (20 mg total) by mouth once a week. Every Saturday morning   NICOTINE (NICODERM CQ - DOSED IN MG/24 HOURS) 14 MG/24HR PATCH     PLACE 1 PATCH (14 MG TOTAL) ONTO THE SKIN DAILY.   ONDANSETRON (ZOFRAN) 4 MG TABLET    Take 2 tablets (8 mg total) by mouth every 8 (eight) hours as needed for nausea.   TRAZODONE (DESYREL) 50 MG TABLET    TAKE 1 TABLET BY MOUTH EVERYDAY AT BEDTIME  Modified Medications   No medications on file  Discontinued Medications   GLECAPREVIR-PIBRENTASVIR (MAVYRET) 100-40 MG TABS    Take 3 tablets by mouth daily with supper.    Allergies: No Known Allergies  Past Medical History: Past Medical History:  Diagnosis Date  . BPH (benign prostatic hyperplasia)   . Hepatitis C 11/27/2017  . Hypertension   . Plasma cell leukemia ([place] 11/23/2017  . Tobacco abuse     Social History: Social History   Socioeconomic History  . Marital status: Single    Spouse name: Not on file  . Number of children: Not on file  . Years of education: Not on file  . Highest education level: Not on file  Occupational History  . Not on file  Social Needs  . Financial resource strain: Not on file  . Food insecurity:    Worry: Not on file    Inability: Not on file  . Transportation needs:    Medical: Not on file    Non-medical: Not on file  Tobacco Use  . Smoking status: Current Every Day Smoker    Packs/day: 0.25    Types: Cigarettes    Last  attempt to quit: 11/09/2017    Years since quitting: 0.6  . Smokeless tobacco: Never Used  Substance and Sexual Activity  . Alcohol use: Yes  . Drug use: No  . Sexual activity: Not on file  Lifestyle  . Physical activity:    Days per week: Not on file    Minutes per session: Not on file  . Stress: Not on file  Relationships  . Social connections:    Talks on phone: Not on file    Gets together: Not on file    Attends religious service: Not on file    Active member of club or organization: Not on file    Attends meetings of clubs or organizations: Not on file    Relationship status: Not on file  Other Topics Concern  . Not on file  Social History  Narrative  . Not on file    Labs: Hepatitis C Lab Results  Component Value Date   HCVRNAPCRQN <15 NOT DETECTED 05/27/2018   FIBROSTAGE F1-F2 04/11/2018   Hepatitis B Lab Results  Component Value Date   HEPBSAB NON-REACTIVE 04/11/2018   HEPBSAG Negative 11/23/2017   HEPBCAB Negative 11/23/2017   Hepatitis A Lab Results  Component Value Date   HAV REACTIVE (A) 04/11/2018   HIV Lab Results  Component Value Date   HIV NON-REACTIVE 04/11/2018   HIV Non Reactive 11/17/2017   Lab Results  Component Value Date   CREATININE 1.96 (H) 07/08/2018   CREATININE 1.99 (H) 07/05/2018   CREATININE 1.82 (H) 07/02/2018   CREATININE 1.99 (H) 06/28/2018   CREATININE 1.88 (H) 06/17/2018   Lab Results  Component Value Date   AST 11 (L) 07/08/2018   AST 12 (L) 07/05/2018   AST 10 (L) 07/02/2018   ALT 10 07/08/2018   ALT 8 07/05/2018   ALT 8 07/02/2018   INR 1.0 04/11/2018   INR 0.97 12/01/2017   INR 1.28 11/26/2017    Assessment: [name] is here today to follow-up for his Hep C infection.  He completed 8 weeks of Mavyret last Saturday with no issues or side effects.  He was a little fatigued but it has resolved.  He missed only one dose throughout the 2 months. His early on treatment Hep C viral load was already undetectable.  I will check another viral load today and have him come back in 3 months for his cure visit.   Plan: - Completed 8 weeks of Mavyret - Hep C VL today - F/u with me for cure visit and labs same day 10/28 at [facility]. Naika Noto, PharmD, [place], [place] for Infectious Disease 07/08/2018, 2:14 PM

## 2018-07-09 LAB — MULTIPLE MYELOMA PANEL, SERUM
ALBUMIN/GLOB SERPL: 1.3 (ref 0.7–1.7)
ALPHA 1: 0.2 g/dL (ref 0.0–0.4)
ALPHA2 GLOB SERPL ELPH-MCNC: 1.1 g/dL — AB (ref 0.4–1.0)
Albumin SerPl Elph-Mcnc: 3.5 g/dL (ref 2.9–4.4)
B-Globulin SerPl Elph-Mcnc: 0.9 g/dL (ref 0.7–1.3)
GAMMA GLOB SERPL ELPH-MCNC: 0.5 g/dL (ref 0.4–1.8)
GLOBULIN, TOTAL: 2.7 g/dL (ref 2.2–3.9)
IGG (IMMUNOGLOBIN G), SERUM: 559 mg/dL — AB (ref 700–1600)
IgA: 15 mg/dL — ABNORMAL LOW (ref 61–437)
IgM (Immunoglobulin M), Srm: 10 mg/dL — ABNORMAL LOW (ref 20–172)
Total Protein ELP: 6.2 g/dL (ref 6.0–8.5)

## 2018-07-10 ENCOUNTER — Other Ambulatory Visit: Payer: Self-pay | Admitting: Hematology

## 2018-07-10 LAB — HEPATITIS C RNA QUANTITATIVE
HCV Quantitative Log: 1.26 Log IU/mL — ABNORMAL HIGH
HCV RNA, PCR, QN: 18 [IU]/mL — AB

## 2018-07-16 ENCOUNTER — Other Ambulatory Visit: Payer: Self-pay

## 2018-07-19 ENCOUNTER — Inpatient Hospital Stay: Payer: BLUE CROSS/BLUE SHIELD

## 2018-07-19 ENCOUNTER — Inpatient Hospital Stay: Payer: BLUE CROSS/BLUE SHIELD | Attending: Hematology

## 2018-07-19 VITALS — BP 129/88 | HR 76 | Temp 98.6°F | Resp 18

## 2018-07-19 DIAGNOSIS — D630 Anemia in neoplastic disease: Secondary | ICD-10-CM | POA: Insufficient documentation

## 2018-07-19 DIAGNOSIS — Z7189 Other specified counseling: Secondary | ICD-10-CM

## 2018-07-19 DIAGNOSIS — Z5112 Encounter for antineoplastic immunotherapy: Secondary | ICD-10-CM | POA: Diagnosis present

## 2018-07-19 DIAGNOSIS — Z87891 Personal history of nicotine dependence: Secondary | ICD-10-CM | POA: Insufficient documentation

## 2018-07-19 DIAGNOSIS — F1011 Alcohol abuse, in remission: Secondary | ICD-10-CM | POA: Diagnosis not present

## 2018-07-19 DIAGNOSIS — C9010 Plasma cell leukemia not having achieved remission: Secondary | ICD-10-CM | POA: Diagnosis present

## 2018-07-19 DIAGNOSIS — B1920 Unspecified viral hepatitis C without hepatic coma: Secondary | ICD-10-CM | POA: Diagnosis not present

## 2018-07-19 DIAGNOSIS — G629 Polyneuropathy, unspecified: Secondary | ICD-10-CM | POA: Insufficient documentation

## 2018-07-19 DIAGNOSIS — N289 Disorder of kidney and ureter, unspecified: Secondary | ICD-10-CM | POA: Diagnosis not present

## 2018-07-19 DIAGNOSIS — C9000 Multiple myeloma not having achieved remission: Secondary | ICD-10-CM

## 2018-07-19 LAB — COMPREHENSIVE METABOLIC PANEL
ALBUMIN: 3.9 g/dL (ref 3.5–5.0)
ALT: 12 U/L (ref 0–44)
ANION GAP: 11 (ref 5–15)
AST: 12 U/L — AB (ref 15–41)
Alkaline Phosphatase: 56 U/L (ref 38–126)
BUN: 26 mg/dL — AB (ref 8–23)
CHLORIDE: 111 mmol/L (ref 98–111)
CO2: 19 mmol/L — ABNORMAL LOW (ref 22–32)
Calcium: 9.1 mg/dL (ref 8.9–10.3)
Creatinine, Ser: 1.99 mg/dL — ABNORMAL HIGH (ref 0.61–1.24)
GFR calc Af Amer: 40 mL/min — ABNORMAL LOW (ref >60)
GFR calc non Af Amer: 34 mL/min — ABNORMAL LOW (ref >60)
GLUCOSE: 93 mg/dL (ref 70–99)
POTASSIUM: 4.7 mmol/L (ref 3.5–5.1)
SODIUM: 141 mmol/L (ref 135–145)
TOTAL PROTEIN: 6.7 g/dL (ref 6.5–8.1)
Total Bilirubin: 0.3 mg/dL (ref 0.3–1.2)

## 2018-07-19 LAB — CBC WITH DIFFERENTIAL (CANCER CENTER ONLY)
BASOS ABS: 0.1 10*3/uL (ref 0.0–0.1)
Basophils Relative: 1 %
EOS PCT: 2 %
Eosinophils Absolute: 0.1 10*3/uL (ref 0.0–0.5)
HEMATOCRIT: 32.1 % — AB (ref 38.4–49.9)
Hemoglobin: 10.9 g/dL — ABNORMAL LOW (ref 13.0–17.1)
LYMPHS ABS: 0.8 10*3/uL — AB (ref 0.9–3.3)
LYMPHS PCT: 15 %
MCH: 32.6 pg (ref 27.2–33.4)
MCHC: 33.9 g/dL (ref 32.0–36.0)
MCV: 96.1 fL (ref 79.3–98.0)
MONO ABS: 0.9 10*3/uL (ref 0.1–0.9)
Monocytes Relative: 17 %
NEUTROS ABS: 3.3 10*3/uL (ref 1.5–6.5)
Neutrophils Relative %: 65 %
Platelet Count: 306 10*3/uL (ref 140–400)
RBC: 3.35 MIL/uL — AB (ref 4.20–5.82)
RDW: 14 % (ref 11.0–14.6)
WBC Count: 5.2 10*3/uL (ref 4.0–10.3)

## 2018-07-19 MED ORDER — ONDANSETRON HCL 8 MG PO TABS
4.0000 mg | ORAL_TABLET | Freq: Once | ORAL | Status: AC
  Administered 2018-07-19: 4 mg via ORAL

## 2018-07-19 MED ORDER — BORTEZOMIB CHEMO SQ INJECTION 3.5 MG (2.5MG/ML)
1.3000 mg/m2 | Freq: Once | INTRAMUSCULAR | Status: AC
Start: 2018-07-19 — End: 2018-07-19
  Administered 2018-07-19: 2.25 mg via SUBCUTANEOUS
  Filled 2018-07-19: qty 0.9

## 2018-07-19 MED ORDER — ONDANSETRON HCL 8 MG PO TABS
ORAL_TABLET | ORAL | Status: AC
  Filled 2018-07-19: qty 1

## 2018-07-19 NOTE — Progress Notes (Signed)
Ok to treat with Cr of 1.99 and CBC from 07/29. Per Dr. Irene Limbo, CBC to be drawn today but will not have to wait on results.

## 2018-07-19 NOTE — Patient Instructions (Signed)
[facility] Discharge Instructions for Patients Receiving Chemotherapy  Today you received the following chemotherapy agents velcade   To help prevent nausea and vomiting after your treatment, we encourage you to take your nausea medication as directed  If you develop nausea and vomiting that is not controlled by your nausea medication, call the clinic.   BELOW ARE SYMPTOMS THAT SHOULD BE REPORTED IMMEDIATELY:  *FEVER GREATER THAN 100.5 F  *CHILLS WITH OR WITHOUT FEVER  NAUSEA AND VOMITING THAT IS NOT CONTROLLED WITH YOUR NAUSEA MEDICATION  *UNUSUAL SHORTNESS OF BREATH  *UNUSUAL BRUISING OR BLEEDING  TENDERNESS IN MOUTH AND THROAT WITH OR WITHOUT PRESENCE OF ULCERS  *URINARY PROBLEMS  *BOWEL PROBLEMS  UNUSUAL RASH Items with * indicate a potential emergency and should be followed up as soon as possible.  Feel free to call the clinic you have any questions or concerns. The clinic phone number is [phone].  

## 2018-07-22 ENCOUNTER — Inpatient Hospital Stay: Payer: BLUE CROSS/BLUE SHIELD

## 2018-07-22 ENCOUNTER — Telehealth: Payer: Self-pay | Admitting: Hematology

## 2018-07-22 VITALS — BP 135/89 | HR 81 | Temp 98.1°F | Resp 18

## 2018-07-22 DIAGNOSIS — C9010 Plasma cell leukemia not having achieved remission: Secondary | ICD-10-CM

## 2018-07-22 DIAGNOSIS — Z7189 Other specified counseling: Secondary | ICD-10-CM

## 2018-07-22 DIAGNOSIS — Z5112 Encounter for antineoplastic immunotherapy: Secondary | ICD-10-CM | POA: Diagnosis not present

## 2018-07-22 MED ORDER — ONDANSETRON HCL 8 MG PO TABS
4.0000 mg | ORAL_TABLET | Freq: Once | ORAL | Status: AC
  Administered 2018-07-22: 4 mg via ORAL

## 2018-07-22 MED ORDER — ONDANSETRON HCL 8 MG PO TABS
ORAL_TABLET | ORAL | Status: AC
  Filled 2018-07-22: qty 1

## 2018-07-22 MED ORDER — BORTEZOMIB CHEMO SQ INJECTION 3.5 MG (2.5MG/ML)
1.3000 mg/m2 | Freq: Once | INTRAMUSCULAR | Status: AC
  Administered 2018-07-22: 2.25 mg via SUBCUTANEOUS
  Filled 2018-07-22: qty 0.9

## 2018-07-22 NOTE — Patient Instructions (Signed)
[facility] Discharge Instructions for Patients Receiving Chemotherapy  Today you received the following chemotherapy agents velcade   To help prevent nausea and vomiting after your treatment, we encourage you to take your nausea medication as directed  If you develop nausea and vomiting that is not controlled by your nausea medication, call the clinic.   BELOW ARE SYMPTOMS THAT SHOULD BE REPORTED IMMEDIATELY:  *FEVER GREATER THAN 100.5 F  *CHILLS WITH OR WITHOUT FEVER  NAUSEA AND VOMITING THAT IS NOT CONTROLLED WITH YOUR NAUSEA MEDICATION  *UNUSUAL SHORTNESS OF BREATH  *UNUSUAL BRUISING OR BLEEDING  TENDERNESS IN MOUTH AND THROAT WITH OR WITHOUT PRESENCE OF ULCERS  *URINARY PROBLEMS  *BOWEL PROBLEMS  UNUSUAL RASH Items with * indicate a potential emergency and should be followed up as soon as possible.  Feel free to call the clinic you have any questions or concerns. The clinic phone number is [phone].  

## 2018-07-22 NOTE — Telephone Encounter (Signed)
Patient stopped by schedule after treatment today to see if 8/19 appointments could be moved to AM due to transportation. Not able to adjust 8/19 appointments, however 9/3 appointment was able to be moved to 9 am. Patient given new appointment calender and will keep appointments on 8/19 as is.

## 2018-07-26 ENCOUNTER — Encounter: Payer: Self-pay | Admitting: *Deleted

## 2018-07-26 ENCOUNTER — Inpatient Hospital Stay: Payer: BLUE CROSS/BLUE SHIELD

## 2018-07-26 VITALS — BP 135/89 | HR 77 | Temp 98.0°F | Resp 17

## 2018-07-26 DIAGNOSIS — Z5112 Encounter for antineoplastic immunotherapy: Secondary | ICD-10-CM | POA: Diagnosis not present

## 2018-07-26 DIAGNOSIS — C9010 Plasma cell leukemia not having achieved remission: Secondary | ICD-10-CM

## 2018-07-26 DIAGNOSIS — C9000 Multiple myeloma not having achieved remission: Secondary | ICD-10-CM

## 2018-07-26 DIAGNOSIS — Z7189 Other specified counseling: Secondary | ICD-10-CM

## 2018-07-26 LAB — CBC WITH DIFFERENTIAL/PLATELET
BASOS PCT: 1 %
Basophils Absolute: 0 10*3/uL (ref 0.0–0.1)
Eosinophils Absolute: 0.1 10*3/uL (ref 0.0–0.5)
Eosinophils Relative: 3 %
HEMATOCRIT: 31.8 % — AB (ref 38.4–49.9)
Hemoglobin: 10.9 g/dL — ABNORMAL LOW (ref 13.0–17.1)
LYMPHS ABS: 0.8 10*3/uL — AB (ref 0.9–3.3)
LYMPHS PCT: 24 %
MCH: 32.8 pg (ref 27.2–33.4)
MCHC: 34.3 g/dL (ref 32.0–36.0)
MCV: 95.6 fL (ref 79.3–98.0)
MONO ABS: 0.8 10*3/uL (ref 0.1–0.9)
MONOS PCT: 23 %
NEUTROS ABS: 1.6 10*3/uL (ref 1.5–6.5)
Neutrophils Relative %: 49 %
Platelets: 190 10*3/uL (ref 140–400)
RBC: 3.32 MIL/uL — ABNORMAL LOW (ref 4.20–5.82)
RDW: 13.8 % (ref 11.0–14.6)
WBC: 3.3 10*3/uL — ABNORMAL LOW (ref 4.0–10.3)

## 2018-07-26 LAB — COMPREHENSIVE METABOLIC PANEL
ALBUMIN: 3.8 g/dL (ref 3.5–5.0)
ALK PHOS: 54 U/L (ref 38–126)
ALT: 11 U/L (ref 0–44)
ANION GAP: 9 (ref 5–15)
AST: 12 U/L — ABNORMAL LOW (ref 15–41)
BUN: 21 mg/dL (ref 8–23)
CALCIUM: 8.3 mg/dL — AB (ref 8.9–10.3)
CHLORIDE: 112 mmol/L — AB (ref 98–111)
CO2: 22 mmol/L (ref 22–32)
Creatinine, Ser: 1.86 mg/dL — ABNORMAL HIGH (ref 0.61–1.24)
GFR calc Af Amer: 43 mL/min — ABNORMAL LOW (ref >60)
GFR calc non Af Amer: 37 mL/min — ABNORMAL LOW (ref >60)
GLUCOSE: 91 mg/dL (ref 70–99)
Potassium: 4.3 mmol/L (ref 3.5–5.1)
SODIUM: 143 mmol/L (ref 135–145)
Total Bilirubin: 0.3 mg/dL (ref 0.3–1.2)
Total Protein: 6.7 g/dL (ref 6.5–8.1)

## 2018-07-26 MED ORDER — DEXAMETHASONE 4 MG PO TABS
40.0000 mg | ORAL_TABLET | Freq: Once | ORAL | Status: AC
  Administered 2018-07-26: 40 mg via ORAL

## 2018-07-26 MED ORDER — BORTEZOMIB CHEMO SQ INJECTION 3.5 MG (2.5MG/ML)
1.3000 mg/m2 | Freq: Once | INTRAMUSCULAR | Status: AC
  Administered 2018-07-26: 2.25 mg via SUBCUTANEOUS
  Filled 2018-07-26: qty 0.9

## 2018-07-26 MED ORDER — DEXAMETHASONE 4 MG PO TABS
ORAL_TABLET | ORAL | Status: AC
  Filled 2018-07-26: qty 10

## 2018-07-26 MED ORDER — ONDANSETRON HCL 8 MG PO TABS
4.0000 mg | ORAL_TABLET | Freq: Once | ORAL | Status: AC
  Administered 2018-07-26: 4 mg via ORAL

## 2018-07-26 MED ORDER — ONDANSETRON HCL 8 MG PO TABS
ORAL_TABLET | ORAL | Status: AC
  Filled 2018-07-26: qty 1

## 2018-07-26 NOTE — Patient Instructions (Signed)
[facility] Discharge Instructions for Patients Receiving Chemotherapy  Today you received the following chemotherapy agents velcade   To help prevent nausea and vomiting after your treatment, we encourage you to take your nausea medication as directed  If you develop nausea and vomiting that is not controlled by your nausea medication, call the clinic.   BELOW ARE SYMPTOMS THAT SHOULD BE REPORTED IMMEDIATELY:  *FEVER GREATER THAN 100.5 F  *CHILLS WITH OR WITHOUT FEVER  NAUSEA AND VOMITING THAT IS NOT CONTROLLED WITH YOUR NAUSEA MEDICATION  *UNUSUAL SHORTNESS OF BREATH  *UNUSUAL BRUISING OR BLEEDING  TENDERNESS IN MOUTH AND THROAT WITH OR WITHOUT PRESENCE OF ULCERS  *URINARY PROBLEMS  *BOWEL PROBLEMS  UNUSUAL RASH Items with * indicate a potential emergency and should be followed up as soon as possible.  Feel free to call the clinic you have any questions or concerns. The clinic phone number is [phone].  

## 2018-07-26 NOTE — Progress Notes (Signed)
Ok to treat with Velcade today 07/26/2018 with Crt 1.86 per Dr Irene Limbo

## 2018-07-26 NOTE — Progress Notes (Signed)
Andres Fernandez   HEMATOLOGY/ONCOLOGY OUTPATIENT PROGRESS NOTE  Date of Service: 07/29/2018   CC:  F/u for continued evaluation and management of Myeloma/Plasma cell leukemia not in remission  HISTORY OF PRESENTING ILLNESS:  Pt is being seen as outpatient today for hospital fu of his plasma cell leukemia. His labs today 12/14/2017 show hgb 8.3, platelets 524k. He is doing well overall. He has staples in his right upper leg following a recent surgery and has not been in touch with the surgeons for a f/u of this. He is not receiving physical therapy and is not getting around at home.  He notes that he was not scheduled for a post hospitalization nephrology f/u and has no PCP.  On review of systems, pt reports intermittent fatigue, SOB, pain to the left leg, dizziness, dry mouth and denies fever, chills, night sweats, dysuria and any other accompanying symptoms.   INTERVAL HISTORY:    Andres Fernandez presents today for follow up and C12 of treatment of his myeloma/plasma cell leukemia.The patient's last visit with [name] was on 07/02/18. The pt reports that he is doing well overall.   The pt reports that he completed his Hep C treatment a month ago and will see ID Dr Linus Salmons again in October. The pt notes that he has some numbness in his feet sometimes.   The pt notes that he does not want to pursue a transplant at this time.   Lab results (07/26/18) of CBC w/diff, CMP is as follows: all values are WNL except for WBC at 3.3k, RBC at 3.32, HGB at 10.9, HCT at 31.8, Lymphs abs at 800, Chloride at 112, Creatinine at 1.86, Calcium at 8.3, AST at 12, GFR at 43.  On review of systems, pt reports occasional numbness, stable energy levels, and denies new bone pains, leg swelling, abdominal pains, and any other symptoms.   REVIEW OF SYSTEMS:    A 10+ POINT REVIEW OF SYSTEMS WAS OBTAINED including neurology, dermatology, psychiatry, cardiac, respiratory, lymph, extremities, GI, GU, Musculoskeletal, constitutional, breasts,  reproductive, HEENT.  All pertinent positives are noted in the HPI.  All others are negative.    MEDICAL HISTORY:  Past Medical History:  Diagnosis Date  . BPH (benign prostatic hyperplasia)   . Hepatitis C 11/27/2017  . Hypertension   . Plasma cell leukemia ([place]) 11/23/2017  . Tobacco abuse     SURGICAL HISTORY: Past Surgical History:  Procedure Laterality Date  . APPENDECTOMY    . FEMUR IM NAIL Right 11/20/2017   Procedure: RIGHT FEMORAL INTRAMEDULLARY (IM) NAIL;  Surgeon: Nicholes Stairs, MD;  Location: [facility];  Service: Orthopedics;  Laterality: Right;    SOCIAL HISTORY: Social History   Socioeconomic History  . Marital status: Single    Spouse name: Not on file  . Number of children: Not on file  . Years of education: Not on file  . Highest education level: Not on file  Occupational History  . Not on file  Social Needs  . Financial resource strain: Not on file  . Food insecurity:    Worry: Not on file    Inability: Not on file  . Transportation needs:    Medical: Not on file    Non-medical: Not on file  Tobacco Use  . Smoking status: Current Every Day Smoker    Packs/day: 0.25    Types: Cigarettes    Last attempt to quit: 11/09/2017    Years since quitting: 0.7  . Smokeless tobacco: Never Used  Substance  and Sexual Activity  . Alcohol use: Yes  . Drug use: No  . Sexual activity: Not on file  Lifestyle  . Physical activity:    Days per week: Not on file    Minutes per session: Not on file  . Stress: Not on file  Relationships  . Social connections:    Talks on phone: Not on file    Gets together: Not on file    Attends religious service: Not on file    Active member of club or organization: Not on file    Attends meetings of clubs or organizations: Not on file    Relationship status: Not on file  . Intimate partner violence:    Fear of current or ex partner: Not on file    Emotionally abused: Not on file    Physically abused: Not on file     Forced sexual activity: Not on file  Other Topics Concern  . Not on file  Social History Narrative  . Not on file    FAMILY HISTORY: Family History  Problem Relation Age of Onset  . COPD Mother   . Liver cancer Mother     ALLERGIES:  has No Known Allergies.  MEDICATIONS:  . Current Outpatient Medications on File Prior to Visit  Medication Sig Dispense Refill  . acyclovir (ZOVIRAX) 400 MG tablet TAKE 1 TABLET BY MOUTH EVERY DAY 30 tablet 6  . cholecalciferol (VITAMIN D) 400 units TABS tablet Take 400 Units by mouth daily.    . cyclophosphamide (CYTOXAN) 50 MG capsule Take 11 caps (539m) by mouth once weekly on days 1, 8, &15, every 21 days. Take with breakfast, maintain hydration. 33 capsule 1  . dexamethasone (DECADRON) 4 MG tablet Take 5 tablets (20 mg total) by mouth once a week. Every Saturday morning 20 tablet 1  . nicotine (NICODERM CQ - DOSED IN MG/24 HOURS) 14 mg/24hr patch PLACE 1 PATCH (14 MG TOTAL) ONTO THE SKIN DAILY. 28 patch 1  . ondansetron (ZOFRAN) 4 MG tablet Take 2 tablets (8 mg total) by mouth every 8 (eight) hours as needed for nausea. 30 tablet 0  . traZODone (DESYREL) 50 MG tablet TAKE 1 TABLET BY MOUTH EVERYDAY AT BEDTIME 30 tablet 0   No current facility-administered medications on file prior to visit.     PHYSICAL EXAMINATION:  VS reviewed Vitals:   07/29/18 1519  BP: (!) 133/95  Pulse: 78  Resp: 18  Temp: 98 F (36.7 C)  SpO2: 100%     GENERAL:alert, in no acute distress and comfortable SKIN: no acute rashes, no significant lesions EYES: conjunctiva are pink and non-injected, sclera anicteric OROPHARYNX: MMM, no exudates, no oropharyngeal erythema or ulceration NECK: supple, no JVD LYMPH:  no palpable lymphadenopathy in the cervical, axillary or inguinal regions LUNGS: clear to auscultation b/l with normal respiratory effort HEART: regular rate & rhythm ABDOMEN:  normoactive bowel sounds , non tender, not distended. No palpable  hepatosplenomegaly.  Extremity: no pedal edema PSYCH: alert & oriented x 3 with fluent speech NEURO: no focal motor/sensory deficits   LABORATORY DATA:  I have reviewed the data as listed  . CBC Latest Ref Rng & Units 07/26/2018 07/19/2018 07/08/2018  WBC 4.0 - 10.3 K/uL 3.3(L) 5.2 4.3  Hemoglobin 13.0 - 17.1 g/dL 10.9(L) 10.9(L) 11.0(L)  Hematocrit 38.4 - 49.9 % 31.8(L) 32.1(L) 31.9(L)  Platelets 140 - 400 K/uL 190 306 156   . CBC    Component Value Date/Time   WBC 3.3 (L)  07/26/2018 0920   RBC 3.32 (L) 07/26/2018 0920   HGB 10.9 (L) 07/26/2018 0920   HGB 10.9 (L) 07/19/2018 0923   HGB 8.3 (L) 12/14/2017 1230   HCT 31.8 (L) 07/26/2018 0920   HCT 23.9 (L) 12/14/2017 1230   PLT 190 07/26/2018 0920   PLT 306 07/19/2018 0923   PLT 524 (H) 12/14/2017 1230   MCV 95.6 07/26/2018 0920   MCV 88.4 12/14/2017 1230   MCH 32.8 07/26/2018 0920   MCHC 34.3 07/26/2018 0920   RDW 13.8 07/26/2018 0920   RDW 15.3 (H) 12/14/2017 1230   LYMPHSABS 0.8 (L) 07/26/2018 0920   LYMPHSABS 1.4 12/14/2017 1230   MONOABS 0.8 07/26/2018 0920   MONOABS 0.6 12/14/2017 1230   EOSABS 0.1 07/26/2018 0920   EOSABS 0.1 12/14/2017 1230   BASOSABS 0.0 07/26/2018 0920   BASOSABS 0.1 12/14/2017 1230     . CMP Latest Ref Rng & Units 07/26/2018 07/19/2018 07/08/2018  Glucose 70 - 99 mg/dL 91 93 114(H)  BUN 8 - 23 mg/dL 21 26(H) 23  Creatinine 0.61 - 1.24 mg/dL 1.86(H) 1.99(H) 1.96(H)  Sodium 135 - 145 mmol/L 143 141 141  Potassium 3.5 - 5.1 mmol/L 4.3 4.7 4.0  Chloride 98 - 111 mmol/L 112(H) 111 110  CO2 22 - 32 mmol/L 22 19(L) 22  Calcium 8.9 - 10.3 mg/dL 8.3(L) 9.1 8.9  Total Protein 6.5 - 8.1 g/dL 6.7 6.7 6.7  Total Bilirubin 0.3 - 1.2 mg/dL 0.3 0.3 0.3  Alkaline Phos 38 - 126 U/L 54 56 58  AST 15 - 41 U/L 12(L) 12(L) 11(L)  ALT 0 - 44 U/L _0 RADIOGRAPHIC STUDIES: I have personally reviewed the radiological images as listed and agreed with the findings in the report. No  results found.  ASSESSMENT & PLAN:   62 y.o. 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 s/p -Hypercalcemia -Renal Failure -Anemia -Extensive Bone lesions -Appears to be primarily Kappa Light chain Myeloma with no overt M spike. Appears to be responding to treatment.- with decreased kappa light chains.   BM Bx showed >95% involvement with kappa restricted serum free light chains. 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 MoL Cy-    PLAN  -I discussed his kappa/lambda is plateau and it may be time to alter treatment regimen or proceed with BM transplant to continue to control his disease. He is hesitant about transplant and high dose chemotherapy.  -I recommend he discuss this option further with Dr. Norma Fredrickson at [facility].- he reports that he has decided not to pursue BM Bx.  -Will continue to monitor peripheral neuropathy while pt takes Velcade -Continue Xgeva every 4 weeks, -Follow up with PCP Dr Christa See  -continue Acyclovir prophylaxis. -Continue weekly labs on D1 AND D8 each cycle -Discussed pt labwork 07/26/18; blood counts and chemistries are stable -Discussed that the patient's M Protein has stabilized -Would like to repeat BM Bx to evaluate treatment efficacy and evaluate necessity of further treatments  -Will order repeat PET/CT as well -The pt has no prohibitive toxicities from continuing C12 CyBorD at this time.  -based on BM Bx and PET/CT might need to switch to maintenance vs 2nd line treatment.  2) s/p Prophylactic IM nailing for rt femur  -She completed Physical Therapy at the [facility].  -patient previously reported improvement and is currently progressively back  to full time work.   3) Thrombocytopenia resolved    4) Renal insufficiency -- likely primarily related to Myeloma multifactorial MM/TLS/sepsis/antibiotics.Primary light chain  nephrology Plan -maintain follow up with nephrology referral to Dr Marval Regal [facility] for continued treatment -Pt will complete 8 weeks of maverick for hep c with Dr Linus Salmons on 07/08/18.  -Kidney numbers improved. 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 -Hgb improved to 10.4   PET/CT in 1 week CT bone marrow biopsy 3-5 days RTC with Dr Irene Limbo in with C13D1 on 8/30 labs    The total time spent in the appt was 30 minutes and more than 50% was on counseling and direct patient cares.     Sullivan Lone MD MS AAHIVMS [facility] Hematology/Oncology Physician [facility]  (Office):       [phone] (Work cell):  [phone] (Fax):           [phone]  I, Baldwin [place], am acting as a scribe for Dr. Irene Limbo  .I have reviewed the above documentation for accuracy and completeness, and I agree with the above. Brunetta Genera MD

## 2018-07-29 ENCOUNTER — Inpatient Hospital Stay (HOSPITAL_BASED_OUTPATIENT_CLINIC_OR_DEPARTMENT_OTHER): Payer: BLUE CROSS/BLUE SHIELD | Admitting: Hematology

## 2018-07-29 ENCOUNTER — Other Ambulatory Visit: Payer: BLUE CROSS/BLUE SHIELD

## 2018-07-29 ENCOUNTER — Inpatient Hospital Stay: Payer: BLUE CROSS/BLUE SHIELD

## 2018-07-29 VITALS — BP 133/95 | HR 78 | Temp 98.0°F | Resp 18 | Ht 68.0 in | Wt 168.1 lb

## 2018-07-29 DIAGNOSIS — N289 Disorder of kidney and ureter, unspecified: Secondary | ICD-10-CM | POA: Diagnosis not present

## 2018-07-29 DIAGNOSIS — Z5112 Encounter for antineoplastic immunotherapy: Secondary | ICD-10-CM | POA: Diagnosis not present

## 2018-07-29 DIAGNOSIS — F1011 Alcohol abuse, in remission: Secondary | ICD-10-CM

## 2018-07-29 DIAGNOSIS — C9010 Plasma cell leukemia not having achieved remission: Secondary | ICD-10-CM

## 2018-07-29 DIAGNOSIS — B1920 Unspecified viral hepatitis C without hepatic coma: Secondary | ICD-10-CM | POA: Diagnosis not present

## 2018-07-29 DIAGNOSIS — Z7189 Other specified counseling: Secondary | ICD-10-CM

## 2018-07-29 DIAGNOSIS — Z87891 Personal history of nicotine dependence: Secondary | ICD-10-CM

## 2018-07-29 DIAGNOSIS — G629 Polyneuropathy, unspecified: Secondary | ICD-10-CM

## 2018-07-29 DIAGNOSIS — D630 Anemia in neoplastic disease: Secondary | ICD-10-CM

## 2018-07-29 MED ORDER — ONDANSETRON HCL 8 MG PO TABS
4.0000 mg | ORAL_TABLET | Freq: Once | ORAL | Status: AC
  Administered 2018-07-29: 4 mg via ORAL

## 2018-07-29 MED ORDER — ONDANSETRON HCL 8 MG PO TABS
ORAL_TABLET | ORAL | Status: AC
  Filled 2018-07-29: qty 1

## 2018-07-29 MED ORDER — BORTEZOMIB CHEMO SQ INJECTION 3.5 MG (2.5MG/ML)
1.3000 mg/m2 | Freq: Once | INTRAMUSCULAR | Status: AC
  Administered 2018-07-29: 2.25 mg via SUBCUTANEOUS
  Filled 2018-07-29: qty 0.9

## 2018-07-29 NOTE — Patient Instructions (Signed)
[facility] Discharge Instructions for Patients Receiving Chemotherapy  Today you received the following chemotherapy agents Velcade.  To help prevent nausea and vomiting after your treatment, we encourage you to take your nausea medication as directed.  If you develop nausea and vomiting that is not controlled by your nausea medication, call the clinic.   BELOW ARE SYMPTOMS THAT SHOULD BE REPORTED IMMEDIATELY:  *FEVER GREATER THAN 100.5 F  *CHILLS WITH OR WITHOUT FEVER  NAUSEA AND VOMITING THAT IS NOT CONTROLLED WITH YOUR NAUSEA MEDICATION  *UNUSUAL SHORTNESS OF BREATH  *UNUSUAL BRUISING OR BLEEDING  TENDERNESS IN MOUTH AND THROAT WITH OR WITHOUT PRESENCE OF ULCERS  *URINARY PROBLEMS  *BOWEL PROBLEMS  UNUSUAL RASH Items with * indicate a potential emergency and should be followed up as soon as possible.  Feel free to call the clinic should you have any questions or concerns. The clinic phone number is [phone].  Please show the CHEMO ALERT CARD at check-in to the Emergency Department and triage nurse.   

## 2018-07-30 ENCOUNTER — Telehealth: Payer: Self-pay

## 2018-07-30 NOTE — Telephone Encounter (Signed)
Spoke with patient concerning upcoming appointment. Per 8/19 los Patient also requested to move his injection appointment. Spoke the RN [employer] , and she said to tell patient he needs to keep the appointment on its current date.

## 2018-07-31 ENCOUNTER — Other Ambulatory Visit: Payer: Self-pay | Admitting: Hematology

## 2018-08-02 ENCOUNTER — Ambulatory Visit: Payer: BLUE CROSS/BLUE SHIELD

## 2018-08-02 MED ORDER — DENOSUMAB 120 MG/1.7ML ~~LOC~~ SOLN
SUBCUTANEOUS | Status: AC
  Filled 2018-08-02: qty 1.7

## 2018-08-06 ENCOUNTER — Encounter (HOSPITAL_COMMUNITY)
Admission: RE | Admit: 2018-08-06 | Discharge: 2018-08-06 | Disposition: A | Discharge status: Home / Self Care | Payer: BLUE CROSS/BLUE SHIELD | Source: Ambulatory Visit | Attending: Hematology | Admitting: Hematology

## 2018-08-06 ENCOUNTER — Inpatient Hospital Stay: Payer: BLUE CROSS/BLUE SHIELD

## 2018-08-06 ENCOUNTER — Other Ambulatory Visit: Payer: Self-pay | Admitting: Physician Assistant

## 2018-08-06 ENCOUNTER — Other Ambulatory Visit: Payer: Self-pay | Admitting: Radiology

## 2018-08-06 DIAGNOSIS — C9010 Plasma cell leukemia not having achieved remission: Secondary | ICD-10-CM

## 2018-08-06 DIAGNOSIS — Z5112 Encounter for antineoplastic immunotherapy: Secondary | ICD-10-CM | POA: Diagnosis not present

## 2018-08-06 DIAGNOSIS — M84551A Pathological fracture in neoplastic disease, right femur, initial encounter for fracture: Secondary | ICD-10-CM

## 2018-08-06 DIAGNOSIS — Z7189 Other specified counseling: Secondary | ICD-10-CM

## 2018-08-06 DIAGNOSIS — Z79899 Other long term (current) drug therapy: Secondary | ICD-10-CM | POA: Diagnosis not present

## 2018-08-06 DIAGNOSIS — M899 Disorder of bone, unspecified: Secondary | ICD-10-CM | POA: Diagnosis not present

## 2018-08-06 DIAGNOSIS — D649 Anemia, unspecified: Secondary | ICD-10-CM | POA: Diagnosis not present

## 2018-08-06 DIAGNOSIS — B1920 Unspecified viral hepatitis C without hepatic coma: Secondary | ICD-10-CM | POA: Diagnosis not present

## 2018-08-06 DIAGNOSIS — N400 Enlarged prostate without lower urinary tract symptoms: Secondary | ICD-10-CM | POA: Diagnosis not present

## 2018-08-06 DIAGNOSIS — C9000 Multiple myeloma not having achieved remission: Secondary | ICD-10-CM | POA: Diagnosis not present

## 2018-08-06 DIAGNOSIS — I10 Essential (primary) hypertension: Secondary | ICD-10-CM | POA: Diagnosis not present

## 2018-08-06 LAB — GLUCOSE, CAPILLARY: Glucose-Capillary: 86 mg/dL (ref 70–99)

## 2018-08-06 MED ORDER — DENOSUMAB 120 MG/1.7ML ~~LOC~~ SOLN
120.0000 mg | Freq: Once | SUBCUTANEOUS | Status: AC
  Administered 2018-08-06: 120 mg via SUBCUTANEOUS

## 2018-08-06 MED ORDER — FLUDEOXYGLUCOSE F - 18 (FDG) INJECTION
9.3700 | Freq: Once | INTRAVENOUS | Status: AC | PRN
  Administered 2018-08-06: 9.37 via INTRAVENOUS

## 2018-08-06 NOTE — Progress Notes (Signed)
Ok to give xgeva at calcium of 8.3 per doctor [place] .

## 2018-08-06 NOTE — Patient Instructions (Signed)

## 2018-08-07 ENCOUNTER — Ambulatory Visit (HOSPITAL_COMMUNITY): Payer: BLUE CROSS/BLUE SHIELD

## 2018-08-07 ENCOUNTER — Ambulatory Visit (HOSPITAL_COMMUNITY)
Admission: RE | Admit: 2018-08-07 | Discharge: 2018-08-07 | Disposition: A | Discharge status: Home / Self Care | Payer: BLUE CROSS/BLUE SHIELD | Source: Ambulatory Visit | Attending: Hematology | Admitting: Hematology

## 2018-08-07 ENCOUNTER — Encounter (HOSPITAL_COMMUNITY): Payer: Self-pay

## 2018-08-07 ENCOUNTER — Other Ambulatory Visit: Payer: Self-pay

## 2018-08-07 DIAGNOSIS — B1920 Unspecified viral hepatitis C without hepatic coma: Secondary | ICD-10-CM | POA: Insufficient documentation

## 2018-08-07 DIAGNOSIS — M899 Disorder of bone, unspecified: Secondary | ICD-10-CM | POA: Insufficient documentation

## 2018-08-07 DIAGNOSIS — N400 Enlarged prostate without lower urinary tract symptoms: Secondary | ICD-10-CM | POA: Insufficient documentation

## 2018-08-07 DIAGNOSIS — C9010 Plasma cell leukemia not having achieved remission: Secondary | ICD-10-CM | POA: Insufficient documentation

## 2018-08-07 DIAGNOSIS — I10 Essential (primary) hypertension: Secondary | ICD-10-CM | POA: Insufficient documentation

## 2018-08-07 DIAGNOSIS — Z79899 Other long term (current) drug therapy: Secondary | ICD-10-CM | POA: Insufficient documentation

## 2018-08-07 DIAGNOSIS — D649 Anemia, unspecified: Secondary | ICD-10-CM | POA: Insufficient documentation

## 2018-08-07 DIAGNOSIS — C9000 Multiple myeloma not having achieved remission: Secondary | ICD-10-CM | POA: Insufficient documentation

## 2018-08-07 LAB — CBC
HEMATOCRIT: 32.2 % — AB (ref 39.0–52.0)
HEMOGLOBIN: 11.4 g/dL — AB (ref 13.0–17.0)
MCH: 32.5 pg (ref 26.0–34.0)
MCHC: 35.4 g/dL (ref 30.0–36.0)
MCV: 91.7 fL (ref 78.0–100.0)
Platelets: 252 10*3/uL (ref 150–400)
RBC: 3.51 MIL/uL — ABNORMAL LOW (ref 4.22–5.81)
RDW: 13.1 % (ref 11.5–15.5)
WBC: 4.5 10*3/uL (ref 4.0–10.5)

## 2018-08-07 LAB — PROTIME-INR
INR: 1.01
Prothrombin Time: 13.2 seconds (ref 11.4–15.2)

## 2018-08-07 LAB — APTT: aPTT: 26 seconds (ref 24–36)

## 2018-08-07 MED ORDER — MIDAZOLAM HCL 2 MG/2ML IJ SOLN
INTRAMUSCULAR | Status: AC | PRN
  Administered 2018-08-07 (×2): 1 mg via INTRAVENOUS

## 2018-08-07 MED ORDER — FENTANYL CITRATE (PF) 100 MCG/2ML IJ SOLN
INTRAMUSCULAR | Status: AC | PRN
  Administered 2018-08-07 (×3): 50 ug via INTRAVENOUS

## 2018-08-07 MED ORDER — NALOXONE HCL 0.4 MG/ML IJ SOLN
INTRAMUSCULAR | Status: AC
  Filled 2018-08-07: qty 1

## 2018-08-07 MED ORDER — FENTANYL CITRATE (PF) 100 MCG/2ML IJ SOLN
INTRAMUSCULAR | Status: AC
  Filled 2018-08-07: qty 4

## 2018-08-07 MED ORDER — SODIUM CHLORIDE 0.9 % IV SOLN
INTRAVENOUS | Status: DC
  Administered 2018-08-07: 08:00:00 via INTRAVENOUS

## 2018-08-07 MED ORDER — FLUMAZENIL 0.5 MG/5ML IV SOLN
INTRAVENOUS | Status: AC
  Filled 2018-08-07: qty 5

## 2018-08-07 MED ORDER — MIDAZOLAM HCL 2 MG/2ML IJ SOLN
INTRAMUSCULAR | Status: AC
  Filled 2018-08-07: qty 4

## 2018-08-07 NOTE — Discharge Instructions (Signed)
Moderate Conscious Sedation, Adult, Care After °These instructions provide you with information about caring for yourself after your procedure. Your health care provider may also give you more specific instructions. Your treatment has been planned according to current medical practices, but problems sometimes occur. Call your health care provider if you have any problems or questions after your procedure. °What can I expect after the procedure? °After your procedure, it is common: °· To feel sleepy for several hours. °· To feel clumsy and have poor balance for several hours. °· To have poor judgment for several hours. °· To vomit if you eat too soon. ° °Follow these instructions at home: °For at least 24 hours after the procedure: ° °· Do not: °? Participate in activities where you could fall or become injured. °? Drive. °? Use heavy machinery. °? Drink alcohol. °? Take sleeping pills or medicines that cause drowsiness. °? Make important decisions or sign legal documents. °? Take care of children on your own. °· Rest. °Eating and drinking °· Follow the diet recommended by your health care provider. °· If you vomit: °? Drink water, juice, or soup when you can drink without vomiting. °? Make sure you have little or no nausea before eating solid foods. °General instructions °· Have a responsible adult stay with you until you are awake and alert. °· Take over-the-counter and prescription medicines only as told by your health care provider. °· If you smoke, do not smoke without supervision. °· Keep all follow-up visits as told by your health care provider. This is important. °Contact a health care provider if: °· You keep feeling nauseous or you keep vomiting. °· You feel light-headed. °· You develop a rash. °· You have a fever. °Get help right away if: °· You have trouble breathing. °This information is not intended to replace advice given to you by your health care provider. Make sure you discuss any questions you have  with your health care provider. °Document Released: 09/17/2013 Document Revised: 05/01/2016 Document Reviewed: 03/18/2016 °Elsevier Interactive Patient Education © 2018 [employer] ° ° °Bone Marrow Aspiration and Bone Marrow Biopsy, Adult, Care After °This sheet gives you information about how to care for yourself after your procedure. Your health care provider may also give you more specific instructions. If you have problems or questions, contact your health care provider. °What can I expect after the procedure? °After the procedure, it is common to have: °· Mild pain and tenderness. °· Swelling. °· Bruising. ° °Follow these instructions at home: °· Take over-the-counter or prescription medicines only as told by your health care provider. °· Do not take baths, swim, or use a hot tub until your health care provider approves. Ask if you can take a shower or have a sponge bath.  You may shower tomorrow. °· Follow instructions from your health care provider about how to take care of the puncture site. Make sure you: °? Wash your hands with soap and water before you change your bandage (dressing). If soap and water are not available, use hand sanitizer. °? Change your dressing as told by your health care provider.  You may remove your dressing tomorrow. °· Check your puncture site every day for signs of infection. Check for: °? More redness, swelling, or pain. °? More fluid or blood. °? Warmth. °? Pus or a bad smell. °· Return to your normal activities as told by your health care provider. Ask your health care provider what activities are safe for you. °· Do not drive   drive for 24 hours if you were given a medicine to help you relax (sedative).  Keep all follow-up visits as told by your health care provider. This is important. Contact a health care provider if:  You have more redness, swelling, or pain around the puncture site.  You have more fluid or blood coming from the puncture site.  Your puncture site  feels warm to the touch.  You have pus or a bad smell coming from the puncture site.  You have a fever.  Your pain is not controlled with medicine. This information is not intended to replace advice given to you by your health care provider. Make sure you discuss any questions you have with your health care provider. Document Released: 06/16/2005 Document Revised: 06/16/2016 Document Reviewed: 05/10/2016 Elsevier Interactive Patient Education  2018 [employer].

## 2018-08-07 NOTE — H&P (Signed)
Referring Physician(s): Brunetta Genera  Supervising Physician: Daryll Brod  Patient Status:  [facility] OP  Chief Complaint:  "I'm having a bone marrow biopsy"  Subjective: Pt familiar to IR service from prior bone marrow biopsy in 2018. He has a history of plasma cell leukemia/myeloma with prior treatment. He presents again today for repeat CT guided bone marrow biopsy to assess response.He denies fever,HA,CP, dyspnea, cough, abd/back pain,N/V or bleeding.   Past Medical History:  Diagnosis Date  . BPH (benign prostatic hyperplasia)   . Hepatitis C 11/27/2017  . Hypertension   . Plasma cell leukemia ([place]) 11/23/2017  . Tobacco abuse    Past Surgical History:  Procedure Laterality Date  . APPENDECTOMY    . FEMUR IM NAIL Right 11/20/2017   Procedure: RIGHT FEMORAL INTRAMEDULLARY (IM) NAIL;  Surgeon: Nicholes Stairs, MD;  Location: [facility];  Service: Orthopedics;  Laterality: Right;      Allergies: Patient has no known allergies.  Medications: Prior to Admission medications   Medication Sig Start Date End Date Taking? Authorizing Provider  acyclovir (ZOVIRAX) 400 MG tablet TAKE 1 TABLET BY MOUTH EVERY DAY 07/10/18  Yes Brunetta Genera, MD  cholecalciferol (VITAMIN D) 400 units TABS tablet Take 400 Units by mouth daily.   Yes [provider]  cyclophosphamide (CYTOXAN) 50 MG capsule Take 11 caps (524m) by mouth once weekly on days 1, 8, &15, every 21 days. Take with breakfast, maintain hydration. 05/28/18  Yes KBrunetta Genera MD  nicotine (NICODERM CQ - DOSED IN MG/24 HOURS) 14 mg/24hr patch PLACE 1 PATCH (14 MG TOTAL) ONTO THE SKIN DAILY. 07/01/18  Yes KBrunetta Genera MD  ondansetron (ZOFRAN) 4 MG tablet Take 2 tablets (8 mg total) by mouth every 8 (eight) hours as needed for nausea. 12/14/17  Yes KBrunetta Genera MD  traZODone (DESYREL) 50 MG tablet TAKE 1 TABLET BY MOUTH EVERYDAY AT BEDTIME 05/14/18  Yes KBrunetta Genera MD    dexamethasone (DECADRON) 4 MG tablet TAKE 5 TABLETS ONCE A WEEK EVERY SATURDAY MORNING 07/31/18   KBrunetta Genera MD     Vital Signs: BP (!) 157/104 (BP Location: Right Arm)   Pulse 76   Temp 98.2 F (36.8 C) (Oral)   Resp 18   SpO2 98%   Physical Exam awake/alert; chest- CTA bilat; heart- RRR; abd- soft,+BS,NT; ext- no edema  Imaging: No results found.  Labs:  CBC: Recent Labs    07/08/18 0854 07/19/18 0923 07/26/18 0920 08/07/18 0757  WBC 4.3 5.2 3.3* 4.5  HGB 11.0* 10.9* 10.9* 11.4*  HCT 31.9* 32.1* 31.8* 32.2*  PLT 156 306 190 252    COAGS: Recent Labs    11/26/17 1229 11/27/17 0701 12/01/17 0724 04/11/18 1059 08/07/18 0757  INR 1.28  --  0.97 1.0 1.01  APTT 59* 95* 30  --  26    BMP: Recent Labs    07/05/18 0810 07/08/18 0854 07/19/18 0923 07/26/18 0920  NA 144 141 141 143  K 4.5 4.0 4.7 4.3  CL 109 110 111 112*  CO2 26 22 19* 22  GLUCOSE 93 114* 93 91  BUN 22 23 26* 21  CALCIUM 8.7* 8.9 9.1 8.3*  CREATININE 1.99* 1.96* 1.99* 1.86*  GFRNONAA 34* 35* 34* 37*  GFRAA 40* 41* 40* 43*    LIVER FUNCTION TESTS: Recent Labs    07/05/18 0810 07/08/18 0854 07/19/18 0923 07/26/18 0920  BILITOT 0.3 0.3 0.3 0.3  AST 12* 11* 12* 12*  ALT _0 ALKPHOS 64 58 56 54  PROT 7.0 6.7 6.7 6.7  ALBUMIN 4.0 3.9 3.9 3.8    Assessment and Plan: Pt with history of plasma cell leukemia/myeloma with prior treatment. He presents  today for repeat CT guided bone marrow biopsy to assess response.Risks and benefits discussed with the patient including, but not limited to bleeding, infection, damage to adjacent structures or low yield requiring additional tests.  All of the patient's questions were answered, patient is agreeable to proceed. Consent signed and in chart.     Electronically Signed: D. Rowe Robert, PA-C 08/07/2018, 8:25 AM   I spent a total of 20 minutes at the the patient's bedside AND on the patient's hospital floor or unit,  greater than 50% of which was counseling/coordinating care for CT guided bone marrow biopsy

## 2018-08-07 NOTE — Procedures (Signed)
Leukemia  S/p RT ILIAC BM ASP AND CORE  No comp Stable EBL 0 Path pending Full report in pacs

## 2018-08-08 ENCOUNTER — Other Ambulatory Visit: Payer: Self-pay

## 2018-08-08 DIAGNOSIS — C9000 Multiple myeloma not having achieved remission: Secondary | ICD-10-CM

## 2018-08-09 ENCOUNTER — Other Ambulatory Visit: Payer: BLUE CROSS/BLUE SHIELD

## 2018-08-09 ENCOUNTER — Inpatient Hospital Stay: Payer: BLUE CROSS/BLUE SHIELD

## 2018-08-09 ENCOUNTER — Other Ambulatory Visit: Payer: Self-pay | Admitting: Hematology and Oncology

## 2018-08-09 ENCOUNTER — Ambulatory Visit: Payer: BLUE CROSS/BLUE SHIELD | Admitting: Hematology

## 2018-08-09 VITALS — BP 158/90 | HR 79 | Temp 98.3°F | Resp 18

## 2018-08-09 DIAGNOSIS — C9010 Plasma cell leukemia not having achieved remission: Secondary | ICD-10-CM

## 2018-08-09 DIAGNOSIS — C9000 Multiple myeloma not having achieved remission: Secondary | ICD-10-CM

## 2018-08-09 DIAGNOSIS — Z5112 Encounter for antineoplastic immunotherapy: Secondary | ICD-10-CM | POA: Diagnosis not present

## 2018-08-09 DIAGNOSIS — Z7189 Other specified counseling: Secondary | ICD-10-CM

## 2018-08-09 LAB — CBC WITH DIFFERENTIAL (CANCER CENTER ONLY)
Basophils Absolute: 0 10*3/uL (ref 0.0–0.1)
Basophils Relative: 1 %
EOS ABS: 0.1 10*3/uL (ref 0.0–0.5)
EOS PCT: 2 %
HCT: 32.7 % — ABNORMAL LOW (ref 38.4–49.9)
Hemoglobin: 11.3 g/dL — ABNORMAL LOW (ref 13.0–17.1)
LYMPHS ABS: 1.1 10*3/uL (ref 0.9–3.3)
LYMPHS PCT: 19 %
MCH: 32.2 pg (ref 27.2–33.4)
MCHC: 34.6 g/dL (ref 32.0–36.0)
MCV: 93.2 fL (ref 79.3–98.0)
MONOS PCT: 11 %
Monocytes Absolute: 0.7 10*3/uL (ref 0.1–0.9)
Neutro Abs: 4 10*3/uL (ref 1.5–6.5)
Neutrophils Relative %: 67 %
PLATELETS: 254 10*3/uL (ref 140–400)
RBC: 3.51 MIL/uL — AB (ref 4.20–5.82)
RDW: 13.5 % (ref 11.0–14.6)
WBC Count: 5.9 10*3/uL (ref 4.0–10.3)

## 2018-08-09 LAB — CMP (CANCER CENTER ONLY)
ALT: 10 U/L (ref 0–44)
ANION GAP: 10 (ref 5–15)
AST: 12 U/L — ABNORMAL LOW (ref 15–41)
Albumin: 4 g/dL (ref 3.5–5.0)
Alkaline Phosphatase: 59 U/L (ref 38–126)
BILIRUBIN TOTAL: 0.2 mg/dL — AB (ref 0.3–1.2)
BUN: 29 mg/dL — ABNORMAL HIGH (ref 8–23)
CO2: 26 mmol/L (ref 22–32)
Calcium: 9.6 mg/dL (ref 8.9–10.3)
Chloride: 109 mmol/L (ref 98–111)
Creatinine: 2.29 mg/dL — ABNORMAL HIGH (ref 0.61–1.24)
GFR, Est AFR Am: 34 mL/min — ABNORMAL LOW (ref >60)
GFR, Estimated: 29 mL/min — ABNORMAL LOW (ref >60)
GLUCOSE: 90 mg/dL (ref 70–99)
POTASSIUM: 4.9 mmol/L (ref 3.5–5.1)
SODIUM: 145 mmol/L (ref 135–145)
TOTAL PROTEIN: 7 g/dL (ref 6.5–8.1)

## 2018-08-09 MED ORDER — BORTEZOMIB CHEMO SQ INJECTION 3.5 MG (2.5MG/ML)
1.3000 mg/m2 | Freq: Once | INTRAMUSCULAR | Status: AC
  Administered 2018-08-09: 2.25 mg via SUBCUTANEOUS
  Filled 2018-08-09: qty 0.9

## 2018-08-09 MED ORDER — ONDANSETRON HCL 8 MG PO TABS
4.0000 mg | ORAL_TABLET | Freq: Once | ORAL | Status: AC
  Administered 2018-08-09: 4 mg via ORAL

## 2018-08-09 MED ORDER — ONDANSETRON HCL 8 MG PO TABS
ORAL_TABLET | ORAL | Status: AC
  Filled 2018-08-09: qty 1

## 2018-08-09 NOTE — Patient Instructions (Signed)
[facility] Discharge Instructions for Patients Receiving Chemotherapy  Today you received the following chemotherapy agents bortezomib (Velcade)  To help prevent nausea and vomiting after your treatment, we encourage you to take your nausea medication as directed.   If you develop nausea and vomiting that is not controlled by your nausea medication, call the clinic.   BELOW ARE SYMPTOMS THAT SHOULD BE REPORTED IMMEDIATELY:  *FEVER GREATER THAN 100.5 F  *CHILLS WITH OR WITHOUT FEVER  NAUSEA AND VOMITING THAT IS NOT CONTROLLED WITH YOUR NAUSEA MEDICATION  *UNUSUAL SHORTNESS OF BREATH  *UNUSUAL BRUISING OR BLEEDING  TENDERNESS IN MOUTH AND THROAT WITH OR WITHOUT PRESENCE OF ULCERS  *URINARY PROBLEMS  *BOWEL PROBLEMS  UNUSUAL RASH Items with * indicate a potential emergency and should be followed up as soon as possible.  Feel free to call the clinic should you have any questions or concerns. The clinic phone number is [phone].  Please show the CHEMO ALERT CARD at check-in to the Emergency Department and triage nurse.   

## 2018-08-12 LAB — MULTIPLE MYELOMA PANEL, SERUM
ALBUMIN SERPL ELPH-MCNC: 3.9 g/dL (ref 2.9–4.4)
Albumin/Glob SerPl: 1.5 (ref 0.7–1.7)
Alpha 1: 0.2 g/dL (ref 0.0–0.4)
Alpha2 Glob SerPl Elph-Mcnc: 1.1 g/dL — ABNORMAL HIGH (ref 0.4–1.0)
B-GLOBULIN SERPL ELPH-MCNC: 0.9 g/dL (ref 0.7–1.3)
Gamma Glob SerPl Elph-Mcnc: 0.5 g/dL (ref 0.4–1.8)
Globulin, Total: 2.7 g/dL (ref 2.2–3.9)
IgA: 15 mg/dL — ABNORMAL LOW (ref 61–437)
IgG (Immunoglobin G), Serum: 609 mg/dL — ABNORMAL LOW (ref 700–1600)
IgM (Immunoglobulin M), Srm: 8 mg/dL — ABNORMAL LOW (ref 20–172)
TOTAL PROTEIN ELP: 6.6 g/dL (ref 6.0–8.5)

## 2018-08-13 ENCOUNTER — Other Ambulatory Visit: Payer: Self-pay | Admitting: Hematology

## 2018-08-13 ENCOUNTER — Inpatient Hospital Stay: Payer: BLUE CROSS/BLUE SHIELD | Attending: Hematology

## 2018-08-13 VITALS — BP 153/94 | HR 88 | Temp 98.3°F | Resp 18

## 2018-08-13 DIAGNOSIS — R21 Rash and other nonspecific skin eruption: Secondary | ICD-10-CM | POA: Diagnosis not present

## 2018-08-13 DIAGNOSIS — Z79899 Other long term (current) drug therapy: Secondary | ICD-10-CM | POA: Diagnosis not present

## 2018-08-13 DIAGNOSIS — D630 Anemia in neoplastic disease: Secondary | ICD-10-CM | POA: Insufficient documentation

## 2018-08-13 DIAGNOSIS — Z7189 Other specified counseling: Secondary | ICD-10-CM

## 2018-08-13 DIAGNOSIS — C9010 Plasma cell leukemia not having achieved remission: Secondary | ICD-10-CM

## 2018-08-13 DIAGNOSIS — F17210 Nicotine dependence, cigarettes, uncomplicated: Secondary | ICD-10-CM | POA: Insufficient documentation

## 2018-08-13 DIAGNOSIS — N289 Disorder of kidney and ureter, unspecified: Secondary | ICD-10-CM | POA: Diagnosis not present

## 2018-08-13 DIAGNOSIS — N400 Enlarged prostate without lower urinary tract symptoms: Secondary | ICD-10-CM | POA: Insufficient documentation

## 2018-08-13 DIAGNOSIS — R42 Dizziness and giddiness: Secondary | ICD-10-CM | POA: Diagnosis not present

## 2018-08-13 DIAGNOSIS — I10 Essential (primary) hypertension: Secondary | ICD-10-CM | POA: Diagnosis not present

## 2018-08-13 DIAGNOSIS — C9000 Multiple myeloma not having achieved remission: Secondary | ICD-10-CM | POA: Insufficient documentation

## 2018-08-13 DIAGNOSIS — Z5112 Encounter for antineoplastic immunotherapy: Secondary | ICD-10-CM | POA: Insufficient documentation

## 2018-08-13 DIAGNOSIS — R5383 Other fatigue: Secondary | ICD-10-CM | POA: Diagnosis not present

## 2018-08-13 DIAGNOSIS — R0602 Shortness of breath: Secondary | ICD-10-CM | POA: Diagnosis not present

## 2018-08-13 DIAGNOSIS — B182 Chronic viral hepatitis C: Secondary | ICD-10-CM | POA: Diagnosis not present

## 2018-08-13 LAB — KAPPA/LAMBDA LIGHT CHAINS
KAPPA, LAMDA LIGHT CHAIN RATIO: 7.44 — AB (ref 0.26–1.65)
Kappa free light chain: 76.6 mg/L — ABNORMAL HIGH (ref 3.3–19.4)
Lambda free light chains: 10.3 mg/L (ref 5.7–26.3)

## 2018-08-13 MED ORDER — ONDANSETRON HCL 8 MG PO TABS
ORAL_TABLET | ORAL | Status: AC
  Filled 2018-08-13: qty 1

## 2018-08-13 MED ORDER — ONDANSETRON HCL 8 MG PO TABS
4.0000 mg | ORAL_TABLET | Freq: Once | ORAL | Status: AC
  Administered 2018-08-13: 4 mg via ORAL

## 2018-08-13 MED ORDER — BORTEZOMIB CHEMO SQ INJECTION 3.5 MG (2.5MG/ML)
1.3000 mg/m2 | Freq: Once | INTRAMUSCULAR | Status: AC
  Administered 2018-08-13: 2.25 mg via SUBCUTANEOUS
  Filled 2018-08-13: qty 0.9

## 2018-08-13 NOTE — Patient Instructions (Signed)
[facility] Discharge Instructions for Patients Receiving Chemotherapy  Today you received the following chemotherapy agents Velcade.  To help prevent nausea and vomiting after your treatment, we encourage you to take your nausea medication as directed.  If you develop nausea and vomiting that is not controlled by your nausea medication, call the clinic.   BELOW ARE SYMPTOMS THAT SHOULD BE REPORTED IMMEDIATELY:  *FEVER GREATER THAN 100.5 F  *CHILLS WITH OR WITHOUT FEVER  NAUSEA AND VOMITING THAT IS NOT CONTROLLED WITH YOUR NAUSEA MEDICATION  *UNUSUAL SHORTNESS OF BREATH  *UNUSUAL BRUISING OR BLEEDING  TENDERNESS IN MOUTH AND THROAT WITH OR WITHOUT PRESENCE OF ULCERS  *URINARY PROBLEMS  *BOWEL PROBLEMS  UNUSUAL RASH Items with * indicate a potential emergency and should be followed up as soon as possible.  Feel free to call the clinic should you have any questions or concerns. The clinic phone number is [phone].  Please show the CHEMO ALERT CARD at check-in to the Emergency Department and triage nurse.   

## 2018-08-13 NOTE — Progress Notes (Signed)
Per Dr. Irene Limbo, okay to treat with crt of 2.29 on 08/09/18

## 2018-08-15 ENCOUNTER — Other Ambulatory Visit: Payer: Self-pay

## 2018-08-15 DIAGNOSIS — C9000 Multiple myeloma not having achieved remission: Secondary | ICD-10-CM

## 2018-08-15 NOTE — Progress Notes (Signed)
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 His labs today 12/14/2017 show hgb 8.3, platelets 524k. He is doing well overall. He has staples in his right upper leg following a recent surgery and has not been in touch with the surgeons for a f/u of this. He is not receiving physical therapy and is not getting around at home.  He notes that he was not scheduled for a post hospitalization nephrology f/u and has no PCP.  On review of systems, pt reports intermittent fatigue, SOB, pain to the left leg, dizziness, dry mouth and denies fever, chills, night sweats, dysuria and any other accompanying symptoms.   INTERVAL HISTORY:    Andres Fernandez presents today for follow up and C13 of treatment of his myeloma/plasma cell leukemia. The patient's last visit with [name] was on 07/29/18. The pt reports that he is doing well overall.   The pt reports that he will be meeting with ID in October for management of his Hep C. However, his 07/08/18 Hep C RNA lab showed detection within three months of completing treatment and I advised the pt to be seen sooner.   The pt notes that he has good energy levels and has developed no new concerns. He notes that his previous peripheral neuropathy has completely resolved.   Of note since the patient's last visit, pt has had PET/CT completed on 08/07/18 with results revealing Expansile lytic lesion involving the left iliac bone, unchanged. Lytic lesion involving the sternum, unchanged. While both are suspicious for myeloma, neither lesion is FDG avid. No hypermetabolic osseous lesions in the visualized axial and appendicular skeleton. Please note that the bilateral lower extremities were not imaged. No suspicious lymphadenopathy.   Spleen is normal in size.  Lab results today (08/16/18) of CBC w/diff, CMP, and Reticulocytes is as follows: all values are WNL except for WBC at 3.6k, RBC at 3.51, HGB at 11.4, HCT at 33.4, Lymphs abs at 500, Glucose at 101, Creatinine at 2.14, Calcium at 8.7, AST at 11, GFR at 37.  On review of systems, pt reports good energy levels, stable weight, and denies mouth sores, peripheral neuropathy, skin rashes, abdominal pain, leg swelling, and any other symptoms.   REVIEW OF SYSTEMS:    A 10+ POINT REVIEW OF SYSTEMS WAS OBTAINED including neurology, dermatology, psychiatry, cardiac, respiratory, lymph, extremities, GI, GU, Musculoskeletal, constitutional, breasts, reproductive, HEENT.  All pertinent positives are noted in the HPI.  All others are negative.   MEDICAL HISTORY:  Past Medical History:  Diagnosis Date  . BPH (benign prostatic hyperplasia)   . Hepatitis C 11/27/2017  . Hypertension   . Plasma cell leukemia ([place]) 11/23/2017  . Tobacco abuse     SURGICAL HISTORY: Past Surgical History:  Procedure Laterality Date  . APPENDECTOMY    . FEMUR IM NAIL Right 11/20/2017   Procedure: RIGHT FEMORAL INTRAMEDULLARY (IM) NAIL;  Surgeon: Andres Stairs, MD;  Location: [facility];  Service: Orthopedics;  Laterality: Right;    SOCIAL HISTORY: Social History   Socioeconomic History  . Marital status: Single    Spouse name: Not on file  . Number of children: Not on file  . Years of education: Not on file  . Highest education level: Not on file  Occupational History  . Not on file  Social Needs  . Financial resource strain: Not on file  . Food insecurity:    Worry: Not on file    Inability: Not on file  . Transportation needs:    Medical: Not on file    Non-medical: Not on file  Tobacco Use  . Smoking status: Current Every Day Smoker    Packs/day: 0.25    Types: Cigarettes    Last attempt to quit: 11/09/2017    Years since quitting: 0.7  . Smokeless tobacco: Never Used    Substance and Sexual Activity  . Alcohol use: Yes  . Drug use: No  . Sexual activity: Not on file  Lifestyle  . Physical activity:    Days per week: Not on file    Minutes per session: Not on file  . Stress: Not on file  Relationships  . Social connections:    Talks on phone: Not on file    Gets together: Not on file    Attends religious service: Not on file    Active member of club or organization: Not on file    Attends meetings of clubs or organizations: Not on file    Relationship status: Not on file  . Intimate partner violence:    Fear of current or ex partner: Not on file    Emotionally abused: Not on file    Physically abused: Not on file    Forced sexual activity: Not on file  Other Topics Concern  . Not on file  Social History Narrative  . Not on file    FAMILY HISTORY: Family History  Problem Relation Age of Onset  . COPD Mother   . Liver cancer Mother     ALLERGIES:  has No Known Allergies.  MEDICATIONS:  . Current Outpatient Medications on File Prior to Visit  Medication Sig Dispense Refill  . acyclovir (ZOVIRAX) 400 MG tablet TAKE 1 TABLET BY MOUTH EVERY DAY 30 tablet 6  . cholecalciferol (VITAMIN D) 400 units TABS tablet Take 400 Units by mouth daily.    . cyclophosphamide (CYTOXAN) 50 MG capsule Take 11 caps (522m) by mouth once weekly on days 1, 8, &15, every 21 days. Take with breakfast, maintain hydration. 33 capsule 1  . dexamethasone (DECADRON) 4 MG tablet TAKE 5 TABLETS ONCE A WEEK EVERY SATURDAY MORNING 20 tablet 1  . nicotine (NICODERM CQ - DOSED IN MG/24 HOURS) 14 mg/24hr patch PLACE 1 PATCH (14 MG TOTAL) ONTO THE SKIN DAILY. 28 patch 1  . ondansetron (ZOFRAN) 4 MG tablet Take 2 tablets (8 mg total) by mouth every 8 (eight) hours as needed for nausea. 30 tablet 0  . traZODone (DESYREL) 50 MG tablet TAKE 1 TABLET BY MOUTH EVERYDAY AT BEDTIME 30 tablet 0   No current facility-administered medications on file prior to visit.     PHYSICAL  EXAMINATION:  VS reviewed Vitals:   08/16/18 0929  BP: (!) 144/88  Pulse: 76  Resp: 18  Temp: 97.9 F (36.6 C)  SpO2: 100%     GENERAL:alert, in no acute distress and comfortable SKIN: no acute rashes, no significant lesions EYES: conjunctiva are pink and non-injected, sclera anicteric OROPHARYNX: MMM, no exudates, no oropharyngeal erythema or ulceration NECK: supple, no JVD LYMPH:  no palpable lymphadenopathy in the cervical, axillary or inguinal regions LUNGS: clear to auscultation b/l with normal respiratory effort HEART: regular rate & rhythm ABDOMEN:  normoactive bowel sounds , non tender, not distended. No palpable  hepatosplenomegaly.  Extremity: no pedal edema PSYCH: alert & oriented x 3 with fluent speech NEURO: no focal motor/sensory deficits    LABORATORY DATA:  I have reviewed the data as listed  . CBC Latest Ref Rng & Units 08/16/2018 08/09/2018 08/07/2018  WBC 4.0 - 10.3 K/uL 3.6(L) 5.9 4.5  Hemoglobin 13.0 - 17.1 g/dL 11.4(L) 11.3(L) 11.4(L)  Hematocrit 38.4 - 49.9 % 33.4(L) 32.7(L) 32.2(L)  Platelets 140 - 400 K/uL 199 254 252   . CBC    Component Value Date/Time   WBC 3.6 (L) 08/16/2018 0815   WBC 4.5 08/07/2018 0757   RBC 3.51 (L) 08/16/2018 0815   HGB 11.4 (L) 08/16/2018 0815   HGB 8.3 (L) 12/14/2017 1230   HCT 33.4 (L) 08/16/2018 0815   HCT 23.9 (L) 12/14/2017 1230   PLT 199 08/16/2018 0815   PLT 524 (H) 12/14/2017 1230   MCV 95.1 08/16/2018 0815   MCV 88.4 12/14/2017 1230   MCH 32.5 08/16/2018 0815   MCHC 34.2 08/16/2018 0815   RDW 14.6 08/16/2018 0815   RDW 15.3 (H) 12/14/2017 1230   LYMPHSABS 0.5 (L) 08/16/2018 0815   LYMPHSABS 1.4 12/14/2017 1230   MONOABS 0.9 08/16/2018 0815   MONOABS 0.6 12/14/2017 1230   EOSABS 0.1 08/16/2018 0815   EOSABS 0.1 12/14/2017 1230   BASOSABS 0.0 08/16/2018 0815   BASOSABS 0.1 12/14/2017 1230     . CMP Latest Ref Rng & Units 08/16/2018 08/09/2018 07/26/2018  Glucose 70 - 99 mg/dL 101(H) 90 91  BUN 8 - 23  mg/dL 23 29(H) 21  Creatinine 0.61 - 1.24 mg/dL 2.14(H) 2.29(H) 1.86(H)  Sodium 135 - 145 mmol/L 143 145 143  Potassium 3.5 - 5.1 mmol/L 4.3 4.9 4.3  Chloride 98 - 111 mmol/L 110 109 112(H)  CO2 22 - 32 mmol/L _0 Calcium 8.9 - 10.3 mg/dL 8.7(L) 9.6 8.3(L)  Total Protein 6.5 - 8.1 g/dL 6.9 7.0 6.7  Total Bilirubin 0.3 - 1.2 mg/dL 0.3 0.2(L) 0.3  Alkaline Phos 38 - 126 U/L 66 59 54  AST 15 - 41 U/L 11(L) 12(L) 12(L)  ALT 0 - 44 U/L _1 08/07/18 BM Bx:       RADIOGRAPHIC STUDIES: I have personally reviewed the radiological images as listed and agreed with the findings in the report. Nm Pet Image Initial (pi) Skull Base To Thigh  Result Date: 08/07/2018 CLINICAL DATA:  Initial treatment strategy for plasma cell leukemia and multiple myeloma. EXAM: NUCLEAR MEDICINE PET SKULL BASE TO THIGH TECHNIQUE: 9.37 mCi F-18 FDG was injected intravenously. Full-ring PET imaging was performed from the skull base to thigh after the radiotracer. CT data was obtained and used for attenuation correction and anatomic localization. Fasting blood glucose: 86 mg/dl COMPARISON:  None. FINDINGS: Mediastinal blood pool activity: SUV max 1.59 NECK: No hypermetabolic cervical lymphadenopathy. Incidental CT findings: none CHEST: No hypermetabolic thoracic lymphadenopathy. No hypermetabolic pulmonary nodules. Incidental CT findings: Moderate centrilobular and paraseptal emphysematous changes. Mild atherosclerotic calcifications of the aortic arch. 4 mm subpleural nodule in the left lower lobe (series 8/image 55), likely benign. ABDOMEN/PELVIS: No abnormal hypermetabolism in the liver, spleen, pancreas, or adrenal glands. Spleen is normal in size.  Benign calcified granuloma posteriorly. No hypermetabolic lymphadenopathy in the abdomen/pelvis. Incidental CT findings: Atherosclerotic calcifications of the abdominal aorta and branch vessels. Bladder is thick-walled although underdistended. SKELETON: No  focal hypermetabolic activity to suggest skeletal metastasis. Expansile lytic lesion involving the left iliac  bone (series 4/image 147), non FDG avid, unchanged. Lytic lesion in the left sternum (series 4/image 87), unchanged, non FDG avid. Incidental CT findings: Status post ORIF of the right proximal femur. Healing right anterolateral 4th, 6th, and 7th rib fracture deformities. IMPRESSION: Expansile lytic lesion involving the left iliac bone, unchanged. Lytic lesion involving the sternum, unchanged. While both are suspicious for myeloma, neither lesion is FDG avid. No hypermetabolic osseous lesions in the visualized axial and appendicular skeleton. Please note that the bilateral lower extremities were not imaged. No suspicious lymphadenopathy.  Spleen is normal in size. Additional ancillary findings as above. Electronically Signed   By: Julian Hy M.D.   On: 08/07/2018 08:49   Ct Biopsy  Result Date: 08/07/2018 INDICATION: Leukemia EXAM: CT GUIDED RIGHT ILIAC BONE MARROW ASPIRATION AND CORE BIOPSY Date:  08/07/2018 08/07/2018 9:45 am Radiologist:  M. Daryll Brod, MD Guidance:  CT FLUOROSCOPY TIME:  Fluoroscopy Time: None. MEDICATIONS: 1% lidocaine local ANESTHESIA/SEDATION: 2.0 mg IV Versed; 150 mcg IV Fentanyl Moderate Sedation Time:  10 minutes The patient was continuously monitored during the procedure by the interventional radiology nurse under my direct supervision. CONTRAST:  None. COMPLICATIONS: None PROCEDURE: Informed consent was obtained from the patient following explanation of the procedure, risks, benefits and alternatives. The patient understands, agrees and consents for the procedure. All questions were addressed. A time out was performed. The patient was positioned prone and non-contrast localization CT was performed of the pelvis to demonstrate the iliac marrow spaces. Maximal barrier sterile technique utilized including caps, mask, sterile gowns, sterile gloves, large sterile drape,  hand hygiene, and Betadine prep. Under sterile conditions and local anesthesia, an 11 gauge coaxial bone biopsy needle was advanced into the right iliac marrow space. Needle position was confirmed with CT imaging. Initially, bone marrow aspiration was performed. Next, the 11 gauge outer cannula was utilized to obtain a right iliac bone marrow core biopsy. Needle was removed. Hemostasis was obtained with compression. The patient tolerated the procedure well. Samples were prepared with the cytotechnologist. No immediate complications. IMPRESSION: CT guided right iliac bone marrow aspiration and core biopsy. Electronically Signed   By: Jerilynn Mages.  Shick M.D.   On: 08/07/2018 09:58   Ct Bone Marrow Biopsy & Aspiration  Result Date: 08/07/2018 INDICATION: Leukemia EXAM: CT GUIDED RIGHT ILIAC BONE MARROW ASPIRATION AND CORE BIOPSY Date:  08/07/2018 08/07/2018 9:45 am Radiologist:  M. Daryll Brod, MD Guidance:  CT FLUOROSCOPY TIME:  Fluoroscopy Time: None. MEDICATIONS: 1% lidocaine local ANESTHESIA/SEDATION: 2.0 mg IV Versed; 150 mcg IV Fentanyl Moderate Sedation Time:  10 minutes The patient was continuously monitored during the procedure by the interventional radiology nurse under my direct supervision. CONTRAST:  None. COMPLICATIONS: None PROCEDURE: Informed consent was obtained from the patient following explanation of the procedure, risks, benefits and alternatives. The patient understands, agrees and consents for the procedure. All questions were addressed. A time out was performed. The patient was positioned prone and non-contrast localization CT was performed of the pelvis to demonstrate the iliac marrow spaces. Maximal barrier sterile technique utilized including caps, mask, sterile gowns, sterile gloves, large sterile drape, hand hygiene, and Betadine prep. Under sterile conditions and local anesthesia, an 11 gauge coaxial bone biopsy needle was advanced into the right iliac marrow space. Needle position was confirmed  with CT imaging. Initially, bone marrow aspiration was performed. Next, the 11 gauge outer cannula was utilized to obtain a right iliac bone marrow core biopsy. Needle was removed. Hemostasis was obtained with compression. The patient  tolerated the procedure well. Samples were prepared with the cytotechnologist. No immediate complications. IMPRESSION: CT guided right iliac bone marrow aspiration and core biopsy. Electronically Signed   By: Jerilynn Mages.  Shick M.D.   On: 08/07/2018 09:58    ASSESSMENT & PLAN:   61 y.o. 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 s/p -Hypercalcemia -Renal Failure -Anemia -Extensive Bone lesions -Appears to be primarily Kappa Light chain Myeloma with no overt M spike. Appears to be responding to treatment.- with decreased kappa light chains.   Initial BM Bx showed >95% involvement with kappa restricted serum free light chains. 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 MoL Cy-    PLAN   -Discussed pt labwork today, 08/16/18; blood counts and chemistries are stable  -Discussed the 08/07/18 BM Bx which revealed normocellular bone marrow with trilineage hematopoiesis and 1% plasma cells which indicates the pt is in remission -Discussed the 08/06/18 PET/CT which revealed  Expansile lytic lesion involving the left iliac bone, unchanged. Lytic lesion involving the sternum, unchanged. While both are suspicious for myeloma, neither lesion is FDG avid. No hypermetabolic osseous lesions in the visualized axial and appendicular skeleton. Please note that the bilateral lower extremities were not imaged. No suspicious lymphadenopathy. Spleen is normal in size.  -Discussed again with the pt the option to pursue transplant or pursue maintenance Ixazomib or Velcade -The pt notes that he would like to talk to the transplant team and Dr. Norma Fredrickson at [facility] and I will provide a referral Dr.  Tiana Loft at [facility] Hematology/Oncology. Appointment made for Monday October 28th at 12:45pm -The pt has no prohibitive toxicities from finishing C13 of CyBorD at this time.  -Advised that the pt return to care with ID before October for management of his Hep C as his 07/08/18 Hep C, RNA lab showed that the virus was detected within 3 moths after completing treatment. -Continue Xgeva every 4 weeks, -Follow up with PCP Dr Christa See  -continue Acyclovir prophylaxis. - will plan to transition to [facility] maintenance if he is not found to be a candidate for transplant.   2) s/p Prophylactic IM nailing for rt femur  -She completed Physical Therapy at the [facility].  -patient previously reported improvement and is currently progressively back to full time work.  3) Thrombocytopenia resolved   4) Renal insufficiency -- likely primarily related to Myeloma multifactorial MM/TLS/sepsis/antibiotics.Primary light chain nephrology Plan -maintain follow up with nephrology referral to Dr Marval Regal [facility] for continued treatment -Pt will complete 8 weeks of maverick for hep c with Dr Linus Salmons on 07/08/18.  -Kidney numbers improved. Creatinine in the 1.7-2.2 range   5) h/o tobacco abuse-counseled on smoking cessation - notes that he has quit smoking  6) h/o cocaine and ETOH abuse -- sober for >3 yrs per patient.  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 -Hgb improved to 11.4 -monitor  Refer to Dr Norma Fredrickson at [facility] for transplant consideration Complete currently scheduled cycle of CyBOrD -- Plan to transition to PO Ixazomib for maintenance.   The total time spent in the appt was 25 minutes and more than 50% was on counseling and direct patient cares.    Andres Lone MD [place] AAHIVMS [facility] Hematology/Oncology Physician [facility]  (Office):       [phone] (Work cell):  [phone] (Fax):            [phone]  I, Baldwin [place], am acting as a scribe for Dr. Irene Limbo  .I have reviewed the above documentation for accuracy and completeness, and I agree with the above. Brunetta Genera MD

## 2018-08-16 ENCOUNTER — Inpatient Hospital Stay (HOSPITAL_BASED_OUTPATIENT_CLINIC_OR_DEPARTMENT_OTHER): Payer: BLUE CROSS/BLUE SHIELD | Admitting: Hematology

## 2018-08-16 ENCOUNTER — Inpatient Hospital Stay: Payer: BLUE CROSS/BLUE SHIELD

## 2018-08-16 VITALS — BP 144/88 | HR 76 | Temp 97.9°F | Resp 18 | Ht 68.0 in | Wt 168.5 lb

## 2018-08-16 DIAGNOSIS — C9000 Multiple myeloma not having achieved remission: Secondary | ICD-10-CM

## 2018-08-16 DIAGNOSIS — D630 Anemia in neoplastic disease: Secondary | ICD-10-CM | POA: Diagnosis not present

## 2018-08-16 DIAGNOSIS — B182 Chronic viral hepatitis C: Secondary | ICD-10-CM

## 2018-08-16 DIAGNOSIS — I10 Essential (primary) hypertension: Secondary | ICD-10-CM

## 2018-08-16 DIAGNOSIS — N400 Enlarged prostate without lower urinary tract symptoms: Secondary | ICD-10-CM

## 2018-08-16 DIAGNOSIS — B1920 Unspecified viral hepatitis C without hepatic coma: Secondary | ICD-10-CM

## 2018-08-16 DIAGNOSIS — F17210 Nicotine dependence, cigarettes, uncomplicated: Secondary | ICD-10-CM

## 2018-08-16 DIAGNOSIS — N289 Disorder of kidney and ureter, unspecified: Secondary | ICD-10-CM

## 2018-08-16 DIAGNOSIS — Z7189 Other specified counseling: Secondary | ICD-10-CM

## 2018-08-16 DIAGNOSIS — C9010 Plasma cell leukemia not having achieved remission: Secondary | ICD-10-CM

## 2018-08-16 DIAGNOSIS — Z79899 Other long term (current) drug therapy: Secondary | ICD-10-CM

## 2018-08-16 LAB — CBC WITH DIFFERENTIAL (CANCER CENTER ONLY)
Basophils Absolute: 0 10*3/uL (ref 0.0–0.1)
Basophils Relative: 1 %
EOS ABS: 0.1 10*3/uL (ref 0.0–0.5)
EOS PCT: 2 %
HCT: 33.4 % — ABNORMAL LOW (ref 38.4–49.9)
HEMOGLOBIN: 11.4 g/dL — AB (ref 13.0–17.1)
LYMPHS ABS: 0.5 10*3/uL — AB (ref 0.9–3.3)
Lymphocytes Relative: 15 %
MCH: 32.5 pg (ref 27.2–33.4)
MCHC: 34.2 g/dL (ref 32.0–36.0)
MCV: 95.1 fL (ref 79.3–98.0)
MONO ABS: 0.9 10*3/uL (ref 0.1–0.9)
MONOS PCT: 26 %
Neutro Abs: 2 10*3/uL (ref 1.5–6.5)
Neutrophils Relative %: 56 %
Platelet Count: 199 10*3/uL (ref 140–400)
RBC: 3.51 MIL/uL — ABNORMAL LOW (ref 4.20–5.82)
RDW: 14.6 % (ref 11.0–14.6)
WBC Count: 3.6 10*3/uL — ABNORMAL LOW (ref 4.0–10.3)

## 2018-08-16 LAB — CMP (CANCER CENTER ONLY)
ALK PHOS: 66 U/L (ref 38–126)
ALT: 8 U/L (ref 0–44)
AST: 11 U/L — AB (ref 15–41)
Albumin: 3.9 g/dL (ref 3.5–5.0)
Anion gap: 11 (ref 5–15)
BUN: 23 mg/dL (ref 8–23)
CHLORIDE: 110 mmol/L (ref 98–111)
CO2: 22 mmol/L (ref 22–32)
CREATININE: 2.14 mg/dL — AB (ref 0.61–1.24)
Calcium: 8.7 mg/dL — ABNORMAL LOW (ref 8.9–10.3)
GFR, Est AFR Am: 37 mL/min — ABNORMAL LOW (ref >60)
GFR, Estimated: 32 mL/min — ABNORMAL LOW (ref >60)
GLUCOSE: 101 mg/dL — AB (ref 70–99)
Potassium: 4.3 mmol/L (ref 3.5–5.1)
SODIUM: 143 mmol/L (ref 135–145)
Total Bilirubin: 0.3 mg/dL (ref 0.3–1.2)
Total Protein: 6.9 g/dL (ref 6.5–8.1)

## 2018-08-16 MED ORDER — IXAZOMIB CITRATE 3 MG PO CAPS: 3.0000 mg | ORAL_CAPSULE | ORAL | 2 refills | Status: DC

## 2018-08-16 MED ORDER — BORTEZOMIB CHEMO SQ INJECTION 3.5 MG (2.5MG/ML)
1.3000 mg/m2 | Freq: Once | INTRAMUSCULAR | Status: AC
  Administered 2018-08-16: 2.25 mg via SUBCUTANEOUS
  Filled 2018-08-16: qty 0.9

## 2018-08-16 MED ORDER — ONDANSETRON HCL 8 MG PO TABS
ORAL_TABLET | ORAL | Status: AC
  Filled 2018-08-16: qty 1

## 2018-08-16 MED ORDER — ONDANSETRON HCL 8 MG PO TABS
4.0000 mg | ORAL_TABLET | Freq: Once | ORAL | Status: AC
  Administered 2018-08-16: 4 mg via ORAL

## 2018-08-16 NOTE — Progress Notes (Signed)
Per Dr. Irene Limbo: OK to treat with Creatinine of 2.14

## 2018-08-16 NOTE — Patient Instructions (Signed)
[facility] Discharge Instructions for Patients Receiving Chemotherapy  Today you received the following chemotherapy agents: Bortezomib (Velcade)  To help prevent nausea and vomiting after your treatment, we encourage you to take your nausea medication as directed.    If you develop nausea and vomiting that is not controlled by your nausea medication, call the clinic.   BELOW ARE SYMPTOMS THAT SHOULD BE REPORTED IMMEDIATELY:  *FEVER GREATER THAN 100.5 F  *CHILLS WITH OR WITHOUT FEVER  NAUSEA AND VOMITING THAT IS NOT CONTROLLED WITH YOUR NAUSEA MEDICATION  *UNUSUAL SHORTNESS OF BREATH  *UNUSUAL BRUISING OR BLEEDING  TENDERNESS IN MOUTH AND THROAT WITH OR WITHOUT PRESENCE OF ULCERS  *URINARY PROBLEMS  *BOWEL PROBLEMS  UNUSUAL RASH Items with * indicate a potential emergency and should be followed up as soon as possible.  Feel free to call the clinic should you have any questions or concerns. The clinic phone number is [phone].  Please show the CHEMO ALERT CARD at check-in to the Emergency Department and triage nurse.   

## 2018-08-19 ENCOUNTER — Telehealth: Payer: Self-pay

## 2018-08-19 ENCOUNTER — Telehealth: Payer: Self-pay | Admitting: Pharmacist

## 2018-08-19 ENCOUNTER — Encounter (HOSPITAL_COMMUNITY): Payer: Self-pay | Admitting: Hematology

## 2018-08-19 ENCOUNTER — Inpatient Hospital Stay: Payer: BLUE CROSS/BLUE SHIELD

## 2018-08-19 ENCOUNTER — Encounter: Payer: Self-pay | Admitting: *Deleted

## 2018-08-19 VITALS — BP 148/92 | HR 84 | Temp 98.2°F | Resp 18

## 2018-08-19 DIAGNOSIS — C9010 Plasma cell leukemia not having achieved remission: Secondary | ICD-10-CM | POA: Diagnosis not present

## 2018-08-19 DIAGNOSIS — C9000 Multiple myeloma not having achieved remission: Secondary | ICD-10-CM

## 2018-08-19 DIAGNOSIS — Z7189 Other specified counseling: Secondary | ICD-10-CM

## 2018-08-19 LAB — MULTIPLE MYELOMA PANEL, SERUM
ALPHA2 GLOB SERPL ELPH-MCNC: 1.1 g/dL — AB (ref 0.4–1.0)
Albumin SerPl Elph-Mcnc: 3.6 g/dL (ref 2.9–4.4)
Albumin/Glob SerPl: 1.4 (ref 0.7–1.7)
Alpha 1: 0.2 g/dL (ref 0.0–0.4)
B-Globulin SerPl Elph-Mcnc: 0.9 g/dL (ref 0.7–1.3)
Gamma Glob SerPl Elph-Mcnc: 0.5 g/dL (ref 0.4–1.8)
Globulin, Total: 2.7 g/dL (ref 2.2–3.9)
IGG (IMMUNOGLOBIN G), SERUM: 585 mg/dL — AB (ref 700–1600)
IGM (IMMUNOGLOBULIN M), SRM: 8 mg/dL — AB (ref 20–172)
IgA: 14 mg/dL — ABNORMAL LOW (ref 61–437)
Total Protein ELP: 6.3 g/dL (ref 6.0–8.5)

## 2018-08-19 LAB — KAPPA/LAMBDA LIGHT CHAINS
KAPPA, LAMDA LIGHT CHAIN RATIO: 6.02 — AB (ref 0.26–1.65)
Kappa free light chain: 51.2 mg/L — ABNORMAL HIGH (ref 3.3–19.4)
LAMDA FREE LIGHT CHAINS: 8.5 mg/L (ref 5.7–26.3)

## 2018-08-19 MED ORDER — IXAZOMIB CITRATE 3 MG PO CAPS: 3.0000 mg | ORAL_CAPSULE | ORAL | 2 refills | Status: DC

## 2018-08-19 MED ORDER — BORTEZOMIB CHEMO SQ INJECTION 3.5 MG (2.5MG/ML)
1.3000 mg/m2 | Freq: Once | INTRAMUSCULAR | Status: AC
  Administered 2018-08-19: 2.25 mg via SUBCUTANEOUS
  Filled 2018-08-19: qty 0.9

## 2018-08-19 MED ORDER — ONDANSETRON HCL 8 MG PO TABS
4.0000 mg | ORAL_TABLET | Freq: Once | ORAL | Status: AC
  Administered 2018-08-19: 4 mg via ORAL

## 2018-08-19 MED ORDER — ONDANSETRON HCL 8 MG PO TABS
ORAL_TABLET | ORAL | Status: AC
  Filled 2018-08-19: qty 1

## 2018-08-19 NOTE — Telephone Encounter (Signed)
Oral Chemotherapy Pharmacist Encounter   I spoke with patient for overview of: Ninlaro (ixazomib) for the maintenance treatment of multiple myeloma / plasma cell leukemia, planned duration until disease progression or unaccepatable toxicity.  Counseled patient on administration, dosing, side effects, monitoring, drug-food interactions, safe handling, storage, and disposal.  Patient will take Ninlaro 3mg capsules, 1 capsule by mouth once weekly. Take on an empty stomach, 1 hour before or 2 hours after a meal. Take on days 1, 8, and 15 of each 28 day cycle.  Patient states he will take his Ninlaro before bed on Mondays for 3 weeks on, 1 week off, repeated.  Patient instructed to discontinue dexamethasone and cyclophosphamide now.  Ninlaro start date: 09/02/18  Adverse effects of Ninlaro include but are not limited to: peripheral edema, constipation, nausea, vomiting, decreased blood counts, and peripheral neuropathy.   Patient has anti-emetic on hand and knows to take it if nausea develops.    Reviewed with patient importance of keeping a medication schedule and plan for any missed doses.  Mr. Dusing voiced understanding and appreciation.   All questions answered. Medication reconciliation performed and medication/allergy list updated.  Confirmed with patient that he will continue on acyclovir daily.  Insurance authorization for Ninlaro has been obtained. Test claim at the [place] outpatient pharmacy revealed copayment $0. Ninlaro prescription will shipped from the [place] outpatient pharmacy on Wednesday (08/28/2018) for delivery to patient's home on Thursday, 08/29/2018. Patient will start his Ninlaro on Monday, 09/02/2018.  I have left message with collaborative practice RN to schedule lab and follow-up appointments for assessment of toleration of Ninlaro after he gets started. Appropriate lab orders will also need to be placed, there are no standing labs in patient's chart at  this time.  Patient instructed to continue on therapy with Ninlaro until directed otherwise. We discussed referral to Dr. Rodriguez at [facility] for consideration for stem cell transplant. If patient is agreeable to transplant, continued therapy with Ninlaro will be directed by Dr. Rodriguez.  Patient with complaints of continued erectile dysfunction. He questions if this is related to the Velcade, and wonders if medication can be prescribed to aid in this. Patient informed that I will make Dr. Kale aware of complaint. Patient will keep the office posted if treatment break and change in therapy helps alleviate some of his ED symptoms.  Patient knows to call the office with questions or concerns. Oral Oncology Clinic will continue to follow.  Jesse Mack, PharmD, BCPS, BCOP  08/19/2018 2:27 PM Oral Oncology Clinic [phone] 

## 2018-08-19 NOTE — Patient Instructions (Signed)
[facility] Discharge Instructions for Patients Receiving Chemotherapy  Today you received the following chemotherapy agents: Bortezomib (Velcade)  To help prevent nausea and vomiting after your treatment, we encourage you to take your nausea medication as directed.    If you develop nausea and vomiting that is not controlled by your nausea medication, call the clinic.   BELOW ARE SYMPTOMS THAT SHOULD BE REPORTED IMMEDIATELY:  *FEVER GREATER THAN 100.5 F  *CHILLS WITH OR WITHOUT FEVER  NAUSEA AND VOMITING THAT IS NOT CONTROLLED WITH YOUR NAUSEA MEDICATION  *UNUSUAL SHORTNESS OF BREATH  *UNUSUAL BRUISING OR BLEEDING  TENDERNESS IN MOUTH AND THROAT WITH OR WITHOUT PRESENCE OF ULCERS  *URINARY PROBLEMS  *BOWEL PROBLEMS  UNUSUAL RASH Items with * indicate a potential emergency and should be followed up as soon as possible.  Feel free to call the clinic should you have any questions or concerns. The clinic phone number is [phone].  Please show the CHEMO ALERT CARD at check-in to the Emergency Department and triage nurse.   

## 2018-08-19 NOTE — Telephone Encounter (Signed)
Per 9/6 los to complete current cycle, and ref.comopleted by Lanelle Bal (RN)

## 2018-08-19 NOTE — Telephone Encounter (Signed)
Per Dr. Irene Limbo, referral needed for patient to see Dr. Tiana Loft at [facility] Hematology/Oncology. Appointment made for Monday October 28th at 12:45pm. Patient aware and verbalized understanding. Patient also given office number [phone] in case he needs to reschedule. Records faxed to [phone]. Confirmation of fax received.

## 2018-08-19 NOTE — Telephone Encounter (Signed)
Oral Oncology Pharmacist Encounter  Received new prescription for Ninlaro (ixazomib) for the maintenance treatment of multiple myeloma / plasma cell leukemia, planned duration until disease progression or unaccepatable toxicity.  Labs from 08/16/2018 assessed, OK for treatment. Noted SCr=2.14, est CrCl ~ 35 mL/min, noted dose reduction of Ninlaro to 40m capsules for renal dysfunction  Current medication list in Epic reviewed, no DDIs with Ninlaro identified.  [facility].  Oral Oncology Clinic will continue to follow for insurance authorization, copayment issues, initial counseling and start date.  JJohny Drilling PharmD, BCPS, BCOP  08/19/2018 10:01 AM Oral Oncology Clinic [phone]

## 2018-08-19 NOTE — Telephone Encounter (Signed)
Oral Oncology Pharmacist Encounter  Received notification that prior authorization for NInalro is required.  PA submitted on CoverMyMeds Key [id number] Status is pending  Oral Oncology Clinic will continue to follow.  Johny Drilling, PharmD, BCPS, BCOP  08/19/2018 10:45 AM Oral Oncology Clinic [phone]

## 2018-08-19 NOTE — Progress Notes (Signed)
Ok to treat with creatinine 2.14 per MD Irene Limbo

## 2018-08-19 NOTE — Telephone Encounter (Signed)
Per Dr. Irene Limbo it is ok to treat with creatinine of 2.14

## 2018-08-21 NOTE — Telephone Encounter (Signed)
Oral Oncology Patient Advocate Encounter  Prior Authorization for Andres Fernandez has been approved.    PA# [id number] Effective dates: 08/19/18 through 08/18/19  Oral Oncology Clinic will continue to follow.   [name] Phone [phone] Fax [phone]

## 2018-08-27 ENCOUNTER — Telehealth: Payer: Self-pay | Admitting: Hematology

## 2018-08-27 NOTE — Telephone Encounter (Signed)
Spoke to pt regarding upcoming appts per 9/13 sch message  °

## 2018-08-28 MED FILL — NINLARO 3 MG CAPSULE: 3 | 28 days supply | Qty: 3 | Fill #0

## 2018-08-28 NOTE — Telephone Encounter (Signed)
Oral Oncology Patient Advocate Encounter  Confirmed with [place] that Andres Fernandez was shipped 08/28/18.  [place] Patient [name] Phone [phone] Fax [phone]

## 2018-08-29 ENCOUNTER — Other Ambulatory Visit: Payer: Self-pay

## 2018-08-29 DIAGNOSIS — C9000 Multiple myeloma not having achieved remission: Secondary | ICD-10-CM

## 2018-08-29 NOTE — Progress Notes (Signed)
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 His labs today 12/14/2017 show hgb 8.3, platelets 524k. He is doing well overall. He has staples in his right upper leg following a recent surgery and has not been in touch with the surgeons for a f/u of this. He is not receiving physical therapy and is not getting around at home.  He notes that he was not scheduled for a post hospitalization nephrology f/u and has no PCP.  On review of systems, pt reports intermittent fatigue, SOB, pain to the left leg, dizziness, dry mouth and denies fever, chills, night sweats, dysuria and any other accompanying symptoms.   INTERVAL HISTORY:    EURAL HOLZSCHUH presents today for follow up after C13 of treatment of his myeloma/plasma cell leukemia. The patient's last visit with [name] was on 08/16/18. The pt reports that he is doing well overall.   The pt reports that he has had good energy levels in the interim. The pt notes that Kennieth Rad was delivered to him yesterday and he will beginning this on 09/02/18.   The pt notes that he had an itchy, mild rash that appeared on both sides of his neck a week ago. He notes that he hasn't changed any medications or detergent. He denies associating this with anything in particular.   The pt notes that his next visit with Dr. Linus Salmons in [place] is on October 28, on the same day as his visit with Dr. Norma Fredrickson at [facility] for transplant consideration. The pt will call Dr. Henreitta Leber office today to try to be seen sooner.    Lab results today (08/30/18) of CBC w/diff, CMP, and Reticulocytes is as follows: all values are WNL except for RBC at 3.59, HGB at 11.2, HCT at 33.2, Monocytes abs at 1.0k, Sodium at 146, Chloride at 114, BUN at 25, Creatinine  at 1.97, AST at 11, GFR at 40.  On review of systems, pt reports good energy levels, mild itchy neck rash, and denies abdominal pains, leg swelling, nausea, SOB, CP, and any other symptoms.   REVIEW OF SYSTEMS:    A 10+ POINT REVIEW OF SYSTEMS WAS OBTAINED including neurology, dermatology, psychiatry, cardiac, respiratory, lymph, extremities, GI, GU, Musculoskeletal, constitutional, breasts, reproductive, HEENT.  All pertinent positives are noted in the HPI.  All others are negative.   MEDICAL HISTORY:  Past Medical History:  Diagnosis Date  . BPH (benign prostatic hyperplasia)   . Hepatitis C 11/27/2017  . Hypertension   . Plasma cell leukemia ([place]) 11/23/2017  . Tobacco abuse     SURGICAL HISTORY: Past Surgical History:  Procedure Laterality Date  . APPENDECTOMY    . FEMUR IM NAIL Right 11/20/2017   Procedure: RIGHT FEMORAL INTRAMEDULLARY (IM) NAIL;  Surgeon: Nicholes Stairs, MD;  Location: [place];  Service: Orthopedics;  Laterality: Right;    SOCIAL HISTORY: Social History   Socioeconomic History  . Marital status: Single    Spouse name: Not on file  . Number of children: Not on file  . Years of education: Not on file  . Highest education level: Not on file  Occupational History  . Not on file  Social Needs  . Financial resource strain: Not on file  . Food insecurity:  Worry: Not on file    Inability: Not on file  . Transportation needs:    Medical: Not on file    Non-medical: Not on file  Tobacco Use  . Smoking status: Current Every Day Smoker    Packs/day: 0.25    Types: Cigarettes    Last attempt to quit: 11/09/2017    Years since quitting: 0.8  . Smokeless tobacco: Never Used  Substance and Sexual Activity  . Alcohol use: Yes  . Drug use: No  . Sexual activity: Not on file  Lifestyle  . Physical activity:    Days per week: Not on file    Minutes per session: Not on file  . Stress: Not on file  Relationships  . Social connections:    Talks  on phone: Not on file    Gets together: Not on file    Attends religious service: Not on file    Active member of club or organization: Not on file    Attends meetings of clubs or organizations: Not on file    Relationship status: Not on file  . Intimate partner violence:    Fear of current or ex partner: Not on file    Emotionally abused: Not on file    Physically abused: Not on file    Forced sexual activity: Not on file  Other Topics Concern  . Not on file  Social History Narrative  . Not on file    FAMILY HISTORY: Family History  Problem Relation Age of Onset  . COPD Mother   . Liver cancer Mother     ALLERGIES:  has No Known Allergies.  MEDICATIONS:  . Current Outpatient Medications on File Prior to Visit  Medication Sig Dispense Refill  . acyclovir (ZOVIRAX) 400 MG tablet TAKE 1 TABLET BY MOUTH EVERY DAY 30 tablet 6  . cholecalciferol (VITAMIN D) 400 units TABS tablet Take 400 Units by mouth daily.    . ixazomib citrate (NINLARO) 3 MG capsule Take 1 capsule (3 mg total) by mouth once a week. On D1,8 and 15 every 28 days.Take on an empty stomach 1hr before or 2hrs after food. 3 capsule 2  . nicotine (NICODERM CQ - DOSED IN MG/24 HOURS) 14 mg/24hr patch PLACE 1 PATCH (14 MG TOTAL) ONTO THE SKIN DAILY. 28 patch 1  . ondansetron (ZOFRAN) 4 MG tablet Take 2 tablets (8 mg total) by mouth every 8 (eight) hours as needed for nausea. 30 tablet 0  . traZODone (DESYREL) 50 MG tablet TAKE 1 TABLET BY MOUTH EVERYDAY AT BEDTIME 30 tablet 0   No current facility-administered medications on file prior to visit.     PHYSICAL EXAMINATION:  VS reviewed Vitals:   08/30/18 0841  BP: (!) 139/93  Pulse: 77  Resp: 18  Temp: 98 F (36.7 C)  SpO2: 100%     GENERAL:alert, in no acute distress and comfortable SKIN: Local irritant contact dermatitis on neck, no significant lesions EYES: conjunctiva are pink and non-injected, sclera anicteric OROPHARYNX: MMM, no exudates, no  oropharyngeal erythema or ulceration NECK: supple, no JVD LYMPH:  no palpable lymphadenopathy in the cervical, axillary or inguinal regions LUNGS: clear to auscultation b/l with normal respiratory effort HEART: regular rate & rhythm ABDOMEN:  normoactive bowel sounds , non tender, not distended. No palpable hepatosplenomegaly.  Extremity: no pedal edema PSYCH: alert & oriented x 3 with fluent speech NEURO: no focal motor/sensory deficits   LABORATORY DATA:  I have reviewed the data as listed  .  CBC Latest Ref Rng & Units 08/30/2018 08/16/2018 08/09/2018  WBC 4.0 - 10.3 K/uL 6.4 3.6(L) 5.9  Hemoglobin 13.0 - 17.1 g/dL 11.2(L) 11.4(L) 11.3(L)  Hematocrit 38.4 - 49.9 % 33.2(L) 33.4(L) 32.7(L)  Platelets 140 - 400 K/uL 241 199 254   . CBC    Component Value Date/Time   WBC 6.4 08/30/2018 0832   WBC 4.5 08/07/2018 0757   RBC 3.59 (L) 08/30/2018 0832   HGB 11.2 (L) 08/30/2018 0832   HGB 8.3 (L) 12/14/2017 1230   HCT 33.2 (L) 08/30/2018 0832   HCT 23.9 (L) 12/14/2017 1230   PLT 241 08/30/2018 0832   PLT 524 (H) 12/14/2017 1230   MCV 92.5 08/30/2018 0832   MCV 88.4 12/14/2017 1230   MCH 31.2 08/30/2018 0832   MCHC 33.7 08/30/2018 0832   RDW 13.6 08/30/2018 0832   RDW 15.3 (H) 12/14/2017 1230   LYMPHSABS 1.1 08/30/2018 0832   LYMPHSABS 1.4 12/14/2017 1230   MONOABS 1.0 (H) 08/30/2018 0832   MONOABS 0.6 12/14/2017 1230   EOSABS 0.2 08/30/2018 0832   EOSABS 0.1 12/14/2017 1230   BASOSABS 0.0 08/30/2018 0832   BASOSABS 0.1 12/14/2017 1230     . CMP Latest Ref Rng & Units 08/30/2018 08/16/2018 08/09/2018  Glucose 70 - 99 mg/dL 97 101(H) 90  BUN 8 - 23 mg/dL 25(H) 23 29(H)  Creatinine 0.61 - 1.24 mg/dL 1.97(H) 2.14(H) 2.29(H)  Sodium 135 - 145 mmol/L 146(H) 143 145  Potassium 3.5 - 5.1 mmol/L 4.6 4.3 4.9  Chloride 98 - 111 mmol/L 114(H) 110 109  CO2 22 - 32 mmol/L '22 22 26  '$ Calcium 8.9 - 10.3 mg/dL 9.2 8.7(L) 9.6  Total Protein 6.5 - 8.1 g/dL 7.0 6.9 7.0  Total Bilirubin 0.3 -  1.2 mg/dL 0.3 0.3 0.2(L)  Alkaline Phos 38 - 126 U/L 64 66 59  AST 15 - 41 U/L 11(L) 11(L) 12(L)  ALT 0 - 44 U/L '7 8 10       '$ 08/07/18 BM Bx:       RADIOGRAPHIC STUDIES: I have personally reviewed the radiological images as listed and agreed with the findings in the report. Nm Pet Image Initial (pi) Skull Base To Thigh  Result Date: 08/07/2018 CLINICAL DATA:  Initial treatment strategy for plasma cell leukemia and multiple myeloma. EXAM: NUCLEAR MEDICINE PET SKULL BASE TO THIGH TECHNIQUE: 9.37 mCi F-18 FDG was injected intravenously. Full-ring PET imaging was performed from the skull base to thigh after the radiotracer. CT data was obtained and used for attenuation correction and anatomic localization. Fasting blood glucose: 86 mg/dl COMPARISON:  None. FINDINGS: Mediastinal blood pool activity: SUV max 1.59 NECK: No hypermetabolic cervical lymphadenopathy. Incidental CT findings: none CHEST: No hypermetabolic thoracic lymphadenopathy. No hypermetabolic pulmonary nodules. Incidental CT findings: Moderate centrilobular and paraseptal emphysematous changes. Mild atherosclerotic calcifications of the aortic arch. 4 mm subpleural nodule in the left lower lobe (series 8/image 55), likely benign. ABDOMEN/PELVIS: No abnormal hypermetabolism in the liver, spleen, pancreas, or adrenal glands. Spleen is normal in size.  Benign calcified granuloma posteriorly. No hypermetabolic lymphadenopathy in the abdomen/pelvis. Incidental CT findings: Atherosclerotic calcifications of the abdominal aorta and branch vessels. Bladder is thick-walled although underdistended. SKELETON: No focal hypermetabolic activity to suggest skeletal metastasis. Expansile lytic lesion involving the left iliac bone (series 4/image 147), non FDG avid, unchanged. Lytic lesion in the left sternum (series 4/image 87), unchanged, non FDG avid. Incidental CT findings: Status post ORIF of the right proximal femur. Healing right anterolateral  4th,  6th, and 7th rib fracture deformities. IMPRESSION: Expansile lytic lesion involving the left iliac bone, unchanged. Lytic lesion involving the sternum, unchanged. While both are suspicious for myeloma, neither lesion is FDG avid. No hypermetabolic osseous lesions in the visualized axial and appendicular skeleton. Please note that the bilateral lower extremities were not imaged. No suspicious lymphadenopathy.  Spleen is normal in size. Additional ancillary findings as above. Electronically Signed   By: Julian Hy M.D.   On: 08/07/2018 08:49   Ct Biopsy  Result Date: 08/07/2018 INDICATION: Leukemia EXAM: CT GUIDED RIGHT ILIAC BONE MARROW ASPIRATION AND CORE BIOPSY Date:  08/07/2018 08/07/2018 9:45 am Radiologist:  M. Daryll Brod, MD Guidance:  CT FLUOROSCOPY TIME:  Fluoroscopy Time: None. MEDICATIONS: 1% lidocaine local ANESTHESIA/SEDATION: 2.0 mg IV Versed; 150 mcg IV Fentanyl Moderate Sedation Time:  10 minutes The patient was continuously monitored during the procedure by the interventional radiology nurse under my direct supervision. CONTRAST:  None. COMPLICATIONS: None PROCEDURE: Informed consent was obtained from the patient following explanation of the procedure, risks, benefits and alternatives. The patient understands, agrees and consents for the procedure. All questions were addressed. A time out was performed. The patient was positioned prone and non-contrast localization CT was performed of the pelvis to demonstrate the iliac marrow spaces. Maximal barrier sterile technique utilized including caps, mask, sterile gowns, sterile gloves, large sterile drape, hand hygiene, and Betadine prep. Under sterile conditions and local anesthesia, an 11 gauge coaxial bone biopsy needle was advanced into the right iliac marrow space. Needle position was confirmed with CT imaging. Initially, bone marrow aspiration was performed. Next, the 11 gauge outer cannula was utilized to obtain a right iliac bone  marrow core biopsy. Needle was removed. Hemostasis was obtained with compression. The patient tolerated the procedure well. Samples were prepared with the cytotechnologist. No immediate complications. IMPRESSION: CT guided right iliac bone marrow aspiration and core biopsy. Electronically Signed   By: Jerilynn Mages.  Shick M.D.   On: 08/07/2018 09:58   Ct Bone Marrow Biopsy & Aspiration  Result Date: 08/07/2018 INDICATION: Leukemia EXAM: CT GUIDED RIGHT ILIAC BONE MARROW ASPIRATION AND CORE BIOPSY Date:  08/07/2018 08/07/2018 9:45 am Radiologist:  M. Daryll Brod, MD Guidance:  CT FLUOROSCOPY TIME:  Fluoroscopy Time: None. MEDICATIONS: 1% lidocaine local ANESTHESIA/SEDATION: 2.0 mg IV Versed; 150 mcg IV Fentanyl Moderate Sedation Time:  10 minutes The patient was continuously monitored during the procedure by the interventional radiology nurse under my direct supervision. CONTRAST:  None. COMPLICATIONS: None PROCEDURE: Informed consent was obtained from the patient following explanation of the procedure, risks, benefits and alternatives. The patient understands, agrees and consents for the procedure. All questions were addressed. A time out was performed. The patient was positioned prone and non-contrast localization CT was performed of the pelvis to demonstrate the iliac marrow spaces. Maximal barrier sterile technique utilized including caps, mask, sterile gowns, sterile gloves, large sterile drape, hand hygiene, and Betadine prep. Under sterile conditions and local anesthesia, an 11 gauge coaxial bone biopsy needle was advanced into the right iliac marrow space. Needle position was confirmed with CT imaging. Initially, bone marrow aspiration was performed. Next, the 11 gauge outer cannula was utilized to obtain a right iliac bone marrow core biopsy. Needle was removed. Hemostasis was obtained with compression. The patient tolerated the procedure well. Samples were prepared with the cytotechnologist. No immediate  complications. IMPRESSION: CT guided right iliac bone marrow aspiration and core biopsy. Electronically Signed   By: Jerilynn Mages.  Shick M.D.   On:  08/07/2018 09:58    ASSESSMENT & PLAN:   62 y.o. 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 s/p -Hypercalcemia -Renal Failure -Anemia -Extensive Bone lesions -Appears to be primarily Kappa Light chain Myeloma with no overt M spike. Appears to be responding to treatment.- with decreased kappa light chains.   Initial BM Bx showed >95% involvement with kappa restricted serum free light chains. 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 MoL Cy-   08/07/18 BM Bx revealed normocellular bone marrow with trilineage hematopoiesis and 1% plasma cells which indicates the pt is in remission 08/06/18 PET/CT revealed  Expansile lytic lesion involving the left iliac bone, unchanged. Lytic lesion involving the sternum, unchanged. While both are suspicious for myeloma, neither lesion is FDG avid. No hypermetabolic osseous lesions in the visualized axial and appendicular skeleton. Please note that the bilateral lower extremities were not imaged. No suspicious lymphadenopathy. Spleen is normal in size.    PLAN: -Appointment made with Dr. Norma Fredrickson at [facility] for transplant consideration on Monday October 28th at 12:45pm -Advised that the pt return to care with ID before October for management of his Hep C as his 07/08/18 Hep C, RNA lab showed that the virus was detected within 3 moths after completing treatment. -Follow up with PCP Dr Christa See  -Discussed pt [place] today, 08/30/18; blood counts are stable -Will begin Ninlaro, as the pt finished 13 cycles of CyBorD and BM bx showed only 1% plasma cells -Discussed dose scheduling of Ninlaro as D1, D8, and D15 with pt and ensured clarity  -Zofran half hour before Ninlaro -Continue Xgeva every 4 weeks -Continue Acyclovir    -Continue with Vitamin D replacement  -Local irritant contact dermatitis on neck, advised keeping the area dry, using Calamine lotion, avoid hot showers, advised not wearing his necklace, and using OTC Hydrocortisone, and Benadryl if very itchy.  -Will see the pt back in 2 weeks after starting Ninlaro and will follow up with pt about Flu Vaccine at that time  2) s/p Prophylactic IM nailing for rt femur  -She completed [facility] at [facility].  -patient previously reported improvement and is currently progressively back to full time work.  3) Thrombocytopenia resolved   4) Renal insufficiency -- likely primarily related to Myeloma multifactorial MM/TLS/sepsis/antibiotics.Primary light chain nephrology Plan -maintain follow up with nephrology referral to Dr Marval Regal [facility] for continued treatment -Pt will complete 8 weeks of maverick for hep c with Dr Linus Salmons on 07/08/18.  -Kidney numbers improved. Creatinine in the 1.7-2.2 range   5) h/o tobacco abuse-counseled on smoking cessation - notes that he has quit smoking  6) h/o cocaine and ETOH abuse -- sober for >3 yrs per patient.  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 -Hgb improved to 11.2 -monitor   RTC with Dr Irene Limbo on 09/16/2018 with labs for Ninlaro toxicity check Cancel all Velcade appointments Continue Marchelle Folks    The total time spent in the appt was 25 minutes and more than 50% was on counseling and direct patient cares.   Sullivan Lone MD MS AAHIVMS [facility] Hematology/Oncology Physician [facility]  (Office):       [phone] (Work cell):  [phone] (Fax):           [phone]  I, Baldwin [place], am acting as a scribe for Dr. Irene Limbo  .I have reviewed the above documentation for accuracy and completeness, and  I agree with the above. Brunetta Genera MD

## 2018-08-30 ENCOUNTER — Inpatient Hospital Stay: Payer: BLUE CROSS/BLUE SHIELD

## 2018-08-30 ENCOUNTER — Ambulatory Visit: Payer: BLUE CROSS/BLUE SHIELD

## 2018-08-30 ENCOUNTER — Encounter: Payer: Self-pay | Admitting: Hematology

## 2018-08-30 ENCOUNTER — Inpatient Hospital Stay (HOSPITAL_BASED_OUTPATIENT_CLINIC_OR_DEPARTMENT_OTHER): Payer: BLUE CROSS/BLUE SHIELD | Admitting: Hematology

## 2018-08-30 ENCOUNTER — Telehealth: Payer: Self-pay

## 2018-08-30 VITALS — BP 139/93 | HR 77 | Temp 98.0°F | Resp 18 | Ht 68.0 in | Wt 167.9 lb

## 2018-08-30 DIAGNOSIS — M84551A Pathological fracture in neoplastic disease, right femur, initial encounter for fracture: Secondary | ICD-10-CM

## 2018-08-30 DIAGNOSIS — C9010 Plasma cell leukemia not having achieved remission: Secondary | ICD-10-CM

## 2018-08-30 DIAGNOSIS — F17210 Nicotine dependence, cigarettes, uncomplicated: Secondary | ICD-10-CM

## 2018-08-30 DIAGNOSIS — D630 Anemia in neoplastic disease: Secondary | ICD-10-CM

## 2018-08-30 DIAGNOSIS — B182 Chronic viral hepatitis C: Secondary | ICD-10-CM

## 2018-08-30 DIAGNOSIS — N400 Enlarged prostate without lower urinary tract symptoms: Secondary | ICD-10-CM

## 2018-08-30 DIAGNOSIS — C9000 Multiple myeloma not having achieved remission: Secondary | ICD-10-CM

## 2018-08-30 DIAGNOSIS — R21 Rash and other nonspecific skin eruption: Secondary | ICD-10-CM

## 2018-08-30 DIAGNOSIS — Z79899 Other long term (current) drug therapy: Secondary | ICD-10-CM

## 2018-08-30 DIAGNOSIS — Z7189 Other specified counseling: Secondary | ICD-10-CM

## 2018-08-30 DIAGNOSIS — N289 Disorder of kidney and ureter, unspecified: Secondary | ICD-10-CM

## 2018-08-30 DIAGNOSIS — R42 Dizziness and giddiness: Secondary | ICD-10-CM

## 2018-08-30 DIAGNOSIS — R5383 Other fatigue: Secondary | ICD-10-CM

## 2018-08-30 DIAGNOSIS — I10 Essential (primary) hypertension: Secondary | ICD-10-CM

## 2018-08-30 DIAGNOSIS — R0602 Shortness of breath: Secondary | ICD-10-CM

## 2018-08-30 LAB — CMP (CANCER CENTER ONLY)
ALBUMIN: 3.8 g/dL (ref 3.5–5.0)
ALK PHOS: 64 U/L (ref 38–126)
ALT: 7 U/L (ref 0–44)
ANION GAP: 10 (ref 5–15)
AST: 11 U/L — ABNORMAL LOW (ref 15–41)
BUN: 25 mg/dL — ABNORMAL HIGH (ref 8–23)
CALCIUM: 9.2 mg/dL (ref 8.9–10.3)
CO2: 22 mmol/L (ref 22–32)
CREATININE: 1.97 mg/dL — AB (ref 0.61–1.24)
Chloride: 114 mmol/L — ABNORMAL HIGH (ref 98–111)
GFR, Est AFR Am: 40 mL/min — ABNORMAL LOW (ref >60)
GFR, Estimated: 35 mL/min — ABNORMAL LOW (ref >60)
GLUCOSE: 97 mg/dL (ref 70–99)
Potassium: 4.6 mmol/L (ref 3.5–5.1)
Sodium: 146 mmol/L — ABNORMAL HIGH (ref 135–145)
TOTAL PROTEIN: 7 g/dL (ref 6.5–8.1)
Total Bilirubin: 0.3 mg/dL (ref 0.3–1.2)

## 2018-08-30 LAB — CBC WITH DIFFERENTIAL (CANCER CENTER ONLY)
Basophils Absolute: 0 10*3/uL (ref 0.0–0.1)
Basophils Relative: 1 %
Eosinophils Absolute: 0.2 10*3/uL (ref 0.0–0.5)
Eosinophils Relative: 3 %
HEMATOCRIT: 33.2 % — AB (ref 38.4–49.9)
HEMOGLOBIN: 11.2 g/dL — AB (ref 13.0–17.1)
LYMPHS ABS: 1.1 10*3/uL (ref 0.9–3.3)
Lymphocytes Relative: 17 %
MCH: 31.2 pg (ref 27.2–33.4)
MCHC: 33.7 g/dL (ref 32.0–36.0)
MCV: 92.5 fL (ref 79.3–98.0)
Monocytes Absolute: 1 10*3/uL — ABNORMAL HIGH (ref 0.1–0.9)
Monocytes Relative: 16 %
NEUTROS ABS: 4.1 10*3/uL (ref 1.5–6.5)
NEUTROS PCT: 63 %
Platelet Count: 241 10*3/uL (ref 140–400)
RBC: 3.59 MIL/uL — ABNORMAL LOW (ref 4.20–5.82)
RDW: 13.6 % (ref 11.0–14.6)
WBC Count: 6.4 10*3/uL (ref 4.0–10.3)

## 2018-08-30 MED ORDER — DENOSUMAB 120 MG/1.7ML ~~LOC~~ SOLN
120.0000 mg | Freq: Once | SUBCUTANEOUS | Status: AC
  Administered 2018-08-30: 120 mg via SUBCUTANEOUS

## 2018-08-30 MED ORDER — DENOSUMAB 120 MG/1.7ML ~~LOC~~ SOLN
SUBCUTANEOUS | Status: AC
  Filled 2018-08-30: qty 1.7

## 2018-08-30 NOTE — Telephone Encounter (Signed)
Printed avs and calender of upcoming appointment. Per 9/20 los 

## 2018-09-02 ENCOUNTER — Telehealth: Payer: Self-pay

## 2018-09-02 NOTE — Telephone Encounter (Signed)
Pt called to request assistance with erectile dysfunction. Dr. Irene Limbo referred pt back to PCP. Pt called and made aware. Pt verbalized understanding. No further concerns or questions at this time.

## 2018-09-16 ENCOUNTER — Inpatient Hospital Stay: Payer: BLUE CROSS/BLUE SHIELD | Admitting: Hematology

## 2018-09-16 ENCOUNTER — Inpatient Hospital Stay: Payer: BLUE CROSS/BLUE SHIELD

## 2018-09-17 ENCOUNTER — Telehealth: Payer: Self-pay | Admitting: *Deleted

## 2018-09-17 NOTE — Telephone Encounter (Signed)
Patient unable to come to appt on 09/16/18 due to lack of transportation. Contacted patient to inform appointment available today at 2:30 if he was able to come. Patient states he is at work and can not come, but has appt on 09/25/18 that he plans to attend.

## 2018-09-20 ENCOUNTER — Telehealth: Payer: Self-pay

## 2018-09-20 ENCOUNTER — Other Ambulatory Visit: Payer: Self-pay

## 2018-09-20 NOTE — Telephone Encounter (Signed)
Called pt regarding cytoxan refill. Per MD note and pt report cytoxan completed and ninlaro to be shipped next Wednesday and start with velcade on 09/30/18. Encouraged pt to call office for refill of ninlaro on the day of his third pill. Pt verbalized understanding and thanks for the information. [place] and spoke with associate, Jenny Reichmann, and notified that cytoxan discontinued.

## 2018-09-24 NOTE — Progress Notes (Signed)
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 His labs today 12/14/2017 show hgb 8.3, platelets 524k. He is doing well overall. He has staples in his right upper leg following a recent surgery and has not been in touch with the surgeons for a f/u of this. He is not receiving physical therapy and is not getting around at home.  He notes that he was not scheduled for a post hospitalization nephrology f/u and has no PCP.  On review of systems, pt reports intermittent fatigue, SOB, pain to the left leg, dizziness, dry mouth and denies fever, chills, night sweats, dysuria and any other accompanying symptoms.   INTERVAL HISTORY:    Andres Fernandez presents today for follow up and continue management of his myeloma/plasma cell leukemia. The patient's last visit with [name] was on 08/30/18. The pt reports that he is doing well overall.   The pt reports that he is in his off week of Ninlaro and has not had any nausea whatsoever, or any problems tolerating the medication. The pt also continues taking Vitamin D and Acyclovir as well.   The pt has had good energy levels and has been staying active and has been eating well.   Lab results today (09/25/18) of CBC w/diff, CMP is as follows: all values are WNL except for RBC at 3.57, HGB at 11.4, HCT at 32.9, CO2 at 20, BUN at 26, Creatinine at 2.04, AST at 13, GFR at 39. 09/25/18 Vitamin D is pending   On review of systems, pt reports good energy levels, eating well, staying active, and denies nausea, skin rashes, diarrhea, concerns for infections, new bone pains, abdominal pains, and any other symptoms.    REVIEW OF SYSTEMS:    A 10+ POINT REVIEW OF SYSTEMS WAS OBTAINED including neurology, dermatology,  psychiatry, cardiac, respiratory, lymph, extremities, GI, GU, Musculoskeletal, constitutional, breasts, reproductive, HEENT.  All pertinent positives are noted in the HPI.  All others are negative.   MEDICAL HISTORY:  Past Medical History:  Diagnosis Date  . BPH (benign prostatic hyperplasia)   . Hepatitis C 11/27/2017  . Hypertension   . 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 Tobacco abuse     SURGICAL HISTORY: Past Surgical History:  Procedure Laterality Date  . APPENDECTOMY    . FEMUR IM NAIL Right 11/20/2017   Procedure: RIGHT FEMORAL INTRAMEDULLARY (IM) NAIL;  Surgeon: Nicholes Stairs, MD;  Location: [place];  Service: Orthopedics;  Laterality: Right;    SOCIAL HISTORY: Social History   Socioeconomic History  . Marital status: Single    Spouse name: Not on file  . Number of children: Not on file  . Years of education: Not on file  . Highest education level: Not on file  Occupational History  . Not on file  Social Needs  . Financial resource strain: Not on file  . Food insecurity:    Worry: Not on file    Inability: Not on file  . Transportation needs:    Medical: Not on file    Non-medical: Not on file  Tobacco Use  . Smoking status: Current Every Day Smoker    Packs/day: 0.25    Types: Cigarettes    Last attempt to quit: 11/09/2017    Years  since quitting: 0.8  . Smokeless tobacco: Never Used  . Tobacco comment: 1 cigarette/day as of 09/25/18  Substance and Sexual Activity  . Alcohol use: Yes  . Drug use: No  . Sexual activity: Not on file  Lifestyle  . Physical activity:    Days per week: Not on file    Minutes per session: Not on file  . Stress: Not on file  Relationships  . Social connections:    Talks on phone: Not on file    Gets together: Not on file    Attends religious service: Not on file    Active member of club or organization: Not on file    Attends meetings of clubs or organizations: Not on file    Relationship status: Not on  file  . Intimate partner violence:    Fear of current or ex partner: Not on file    Emotionally abused: Not on file    Physically abused: Not on file    Forced sexual activity: Not on file  Other Topics Concern  . Not on file  Social History Narrative  . Not on file    FAMILY HISTORY: Family History  Problem Relation Age of Onset  . COPD Mother   . Liver cancer Mother     ALLERGIES:  has No Known Allergies.  MEDICATIONS:  . Current Outpatient Medications on File Prior to Visit  Medication Sig Dispense Refill  . acyclovir (ZOVIRAX) 400 MG tablet TAKE 1 TABLET BY MOUTH EVERY DAY 30 tablet 6  . cholecalciferol (VITAMIN D) 400 units TABS tablet Take 400 Units by mouth daily.    . ixazomib citrate (NINLARO) 3 MG capsule Take 1 capsule (3 mg total) by mouth once a week. On D1,8 and 15 every 28 days.Take on an empty stomach 1hr before or 2hrs after food. 3 capsule 2  . nicotine (NICODERM CQ - DOSED IN MG/24 HOURS) 14 mg/24hr patch PLACE 1 PATCH (14 MG TOTAL) ONTO THE SKIN DAILY. 28 patch 1  . ondansetron (ZOFRAN) 4 MG tablet Take 2 tablets (8 mg total) by mouth every 8 (eight) hours as needed for nausea. (Patient not taking: Reported on 09/25/2018) 30 tablet 0   No current facility-administered medications on file prior to visit.     PHYSICAL EXAMINATION:  VS reviewed Vitals:   09/25/18 1350  BP: (!) 130/93  Pulse: 77  Resp: 18  Temp: 98.2 F (36.8 C)  SpO2: 100%     GENERAL:alert, in no acute distress and comfortable SKIN: Local irritant contact dermatitis on neck, no significant lesions EYES: conjunctiva are pink and non-injected, sclera anicteric OROPHARYNX: MMM, no exudates, no oropharyngeal erythema or ulceration NECK: supple, no JVD LYMPH:  no palpable lymphadenopathy in the cervical, axillary or inguinal regions LUNGS: clear to auscultation b/l with normal respiratory effort HEART: regular rate & rhythm ABDOMEN:  normoactive bowel sounds , non tender, not  distended. No palpable hepatosplenomegaly.  Extremity: no pedal edema PSYCH: alert & oriented x 3 with fluent speech NEURO: no focal motor/sensory deficits   LABORATORY DATA:  I have reviewed the data as listed  . CBC Latest Ref Rng & Units 09/25/2018 08/30/2018 08/16/2018  WBC 4.0 - 10.5 K/uL 6.2 6.4 3.6(L)  Hemoglobin 13.0 - 17.0 g/dL 11.4(L) 11.2(L) 11.4(L)  Hematocrit 39.0 - 52.0 % 32.9(L) 33.2(L) 33.4(L)  Platelets 150 - 400 K/uL 195 241 199   . CBC    Component Value Date/Time   WBC 6.2 09/25/2018 1312   RBC 3.57 (  L) 09/25/2018 1312   HGB 11.4 (L) 09/25/2018 1312   HGB 11.2 (L) 08/30/2018 0832   HGB 8.3 (L) 12/14/2017 1230   HCT 32.9 (L) 09/25/2018 1312   HCT 23.9 (L) 12/14/2017 1230   PLT 195 09/25/2018 1312   PLT 241 08/30/2018 0832   PLT 524 (H) 12/14/2017 1230   MCV 92.2 09/25/2018 1312   MCV 88.4 12/14/2017 1230   MCH 31.9 09/25/2018 1312   MCHC 34.7 09/25/2018 1312   RDW 13.4 09/25/2018 1312   RDW 15.3 (H) 12/14/2017 1230   LYMPHSABS 1.5 09/25/2018 1312   LYMPHSABS 1.4 12/14/2017 1230   MONOABS 0.8 09/25/2018 1312   MONOABS 0.6 12/14/2017 1230   EOSABS 0.1 09/25/2018 1312   EOSABS 0.1 12/14/2017 1230   BASOSABS 0.1 09/25/2018 1312   BASOSABS 0.1 12/14/2017 1230     . CMP Latest Ref Rng & Units 09/25/2018 08/30/2018 08/16/2018  Glucose 70 - 99 mg/dL 83 97 101(H)  BUN 8 - 23 mg/dL 26(H) 25(H) 23  Creatinine 0.61 - 1.24 mg/dL 2.04(H) 1.97(H) 2.14(H)  Sodium 135 - 145 mmol/L 142 146(H) 143  Potassium 3.5 - 5.1 mmol/L 4.1 4.6 4.3  Chloride 98 - 111 mmol/L 111 114(H) 110  CO2 22 - 32 mmol/L 20(L) 22 22  Calcium 8.9 - 10.3 mg/dL 9.4 9.2 8.7(L)  Total Protein 6.5 - 8.1 g/dL 6.9 7.0 6.9  Total Bilirubin 0.3 - 1.2 mg/dL 0.3 0.3 0.3  Alkaline Phos 38 - 126 U/L 56 64 66  AST 15 - 41 U/L 13(L) 11(L) 11(L)  ALT 0 - 44 U/L 10 7 8        08/07/18 BM Bx:       RADIOGRAPHIC STUDIES: I have personally reviewed the radiological images as listed and agreed  with the findings in the report. No results found.  ASSESSMENT & PLAN:   62 y.o. 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 s/p -Hypercalcemia -Renal Failure -Anemia -Extensive Bone lesions -Appears to be primarily Kappa Light chain Myeloma with no overt M spike. Appears to be responding to treatment.- with decreased kappa light chains.   Initial BM Bx showed >95% involvement with kappa restricted serum free light chains. 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 MoL Cy-   08/07/18 BM Bx revealed normocellular bone marrow with trilineage hematopoiesis and 1% plasma cells which indicates the pt is in remission 08/06/18 PET/CT revealed  Expansile lytic lesion involving the left iliac bone, unchanged. Lytic lesion involving the sternum, unchanged. While both are suspicious for myeloma, neither lesion is FDG avid. No hypermetabolic osseous lesions in the visualized axial and appendicular skeleton. Please note that the bilateral lower extremities were not imaged. No suspicious lymphadenopathy. Spleen is normal in size.    PLAN: -Appointment made with Dr. Norma Fredrickson at [facility] for transplant consideration on Monday October 28th at 12:45pm -Advised that the pt return to care with ID before October for management of his Hep C as his 07/08/18 Hep C, RNA lab showed that the virus was detected within 3 moths after completing treatment. -Follow up with PCP Dr Christa See  -Have begun Ninlaro 89m maintenance after pt finished 13 cycles of CyBorD and BM bx showed only 1% plasma cells -Zofran half hour before Ninlaro -Discussed pt labwork today, 09/25/18; blood counts and chemistries are stable  -The pt has no prohibitive toxicities from continuing Ninlaro on D1, 8, and 15 at this time.   -  Continue Acyclovir and Vitamin D replacement  -Will give pt flu vaccine in clinic today -Continue Xgeva every 4  weeks   2) s/p Prophylactic IM nailing for rt femur  completed Physical Therapy at the [facility].  -patient previously reported improvement and is currently progressively back to full time work.  3) Thrombocytopenia resolved   4) Renal insufficiency -- likely primarily related to Myeloma multifactorial MM/TLS/sepsis/antibiotics.Primary light chain nephrology Plan -maintain follow up with nephrology referral to Dr Marval Regal [facility] for continued treatment -Pt will complete 8 weeks of maverick for hep c with Dr Linus Salmons on 07/08/18.  -Kidney numbers improved. Creatinine in the 1.7-2.2 range   5) h/o tobacco abuse-counseled on smoking cessation - notes that he has quit smoking  6) h/o cocaine and ETOH abuse -- sober for >3 yrs per patient.  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 -Hgb improved to 11.2   -monitor   Continue Marchelle Folks (next dose 10/28/2018 change from 11/15) RTC with Dr Irene Limbo in about 4 weeks on 10/28/2018 with labs Flu shot today   The total time spent in the appt was 25 minutes and more than 50% was on counseling and direct patient cares.    Sullivan Lone MD MS AAHIVMS [facility] Hematology/Oncology Physician [facility]  (Office):       [phone] (Work cell):  [phone] (Fax):           [phone]  I, Baldwin Jamaica, am acting as a scribe for Dr. Irene Limbo  .I have reviewed the above documentation for accuracy and completeness, and I agree with the above. Brunetta Genera MD

## 2018-09-25 ENCOUNTER — Inpatient Hospital Stay: Payer: BLUE CROSS/BLUE SHIELD | Attending: Hematology | Admitting: Hematology

## 2018-09-25 ENCOUNTER — Inpatient Hospital Stay: Payer: BLUE CROSS/BLUE SHIELD

## 2018-09-25 ENCOUNTER — Encounter: Payer: Self-pay | Admitting: Hematology

## 2018-09-25 VITALS — BP 130/93 | HR 77 | Temp 98.2°F | Resp 18 | Ht 68.0 in | Wt 170.6 lb

## 2018-09-25 DIAGNOSIS — N400 Enlarged prostate without lower urinary tract symptoms: Secondary | ICD-10-CM | POA: Diagnosis not present

## 2018-09-25 DIAGNOSIS — D630 Anemia in neoplastic disease: Secondary | ICD-10-CM | POA: Diagnosis not present

## 2018-09-25 DIAGNOSIS — C9000 Multiple myeloma not having achieved remission: Secondary | ICD-10-CM | POA: Diagnosis not present

## 2018-09-25 DIAGNOSIS — Z79899 Other long term (current) drug therapy: Secondary | ICD-10-CM

## 2018-09-25 DIAGNOSIS — R5383 Other fatigue: Secondary | ICD-10-CM | POA: Insufficient documentation

## 2018-09-25 DIAGNOSIS — Z23 Encounter for immunization: Secondary | ICD-10-CM | POA: Diagnosis not present

## 2018-09-25 DIAGNOSIS — C9010 Plasma cell leukemia not having achieved remission: Secondary | ICD-10-CM

## 2018-09-25 DIAGNOSIS — I10 Essential (primary) hypertension: Secondary | ICD-10-CM | POA: Insufficient documentation

## 2018-09-25 DIAGNOSIS — F17210 Nicotine dependence, cigarettes, uncomplicated: Secondary | ICD-10-CM | POA: Diagnosis not present

## 2018-09-25 DIAGNOSIS — A419 Sepsis, unspecified organism: Secondary | ICD-10-CM | POA: Insufficient documentation

## 2018-09-25 DIAGNOSIS — Z792 Long term (current) use of antibiotics: Secondary | ICD-10-CM | POA: Insufficient documentation

## 2018-09-25 DIAGNOSIS — R0602 Shortness of breath: Secondary | ICD-10-CM | POA: Diagnosis not present

## 2018-09-25 DIAGNOSIS — M899 Disorder of bone, unspecified: Secondary | ICD-10-CM | POA: Diagnosis not present

## 2018-09-25 DIAGNOSIS — R42 Dizziness and giddiness: Secondary | ICD-10-CM | POA: Diagnosis not present

## 2018-09-25 DIAGNOSIS — N289 Disorder of kidney and ureter, unspecified: Secondary | ICD-10-CM | POA: Diagnosis not present

## 2018-09-25 DIAGNOSIS — Z7189 Other specified counseling: Secondary | ICD-10-CM

## 2018-09-25 DIAGNOSIS — M84551A Pathological fracture in neoplastic disease, right femur, initial encounter for fracture: Secondary | ICD-10-CM

## 2018-09-25 DIAGNOSIS — M79605 Pain in left leg: Secondary | ICD-10-CM | POA: Diagnosis not present

## 2018-09-25 LAB — CMP (CANCER CENTER ONLY)
ALBUMIN: 3.9 g/dL (ref 3.5–5.0)
ALK PHOS: 56 U/L (ref 38–126)
ALT: 10 U/L (ref 0–44)
AST: 13 U/L — AB (ref 15–41)
Anion gap: 11 (ref 5–15)
BILIRUBIN TOTAL: 0.3 mg/dL (ref 0.3–1.2)
BUN: 26 mg/dL — AB (ref 8–23)
CALCIUM: 9.4 mg/dL (ref 8.9–10.3)
CO2: 20 mmol/L — ABNORMAL LOW (ref 22–32)
CREATININE: 2.04 mg/dL — AB (ref 0.61–1.24)
Chloride: 111 mmol/L (ref 98–111)
GFR, EST NON AFRICAN AMERICAN: 33 mL/min — AB (ref >60)
GFR, Est AFR Am: 39 mL/min — ABNORMAL LOW (ref >60)
GLUCOSE: 83 mg/dL (ref 70–99)
Potassium: 4.1 mmol/L (ref 3.5–5.1)
Sodium: 142 mmol/L (ref 135–145)
TOTAL PROTEIN: 6.9 g/dL (ref 6.5–8.1)

## 2018-09-25 LAB — CBC WITH DIFFERENTIAL/PLATELET
Abs Immature Granulocytes: 0.01 10*3/uL (ref 0.00–0.07)
BASOS PCT: 1 %
Basophils Absolute: 0.1 10*3/uL (ref 0.0–0.1)
EOS ABS: 0.1 10*3/uL (ref 0.0–0.5)
Eosinophils Relative: 2 %
HEMATOCRIT: 32.9 % — AB (ref 39.0–52.0)
HEMOGLOBIN: 11.4 g/dL — AB (ref 13.0–17.0)
Immature Granulocytes: 0 %
Lymphocytes Relative: 24 %
Lymphs Abs: 1.5 10*3/uL (ref 0.7–4.0)
MCH: 31.9 pg (ref 26.0–34.0)
MCHC: 34.7 g/dL (ref 30.0–36.0)
MCV: 92.2 fL (ref 80.0–100.0)
MONO ABS: 0.8 10*3/uL (ref 0.1–1.0)
MONOS PCT: 13 %
Neutro Abs: 3.7 10*3/uL (ref 1.7–7.7)
Neutrophils Relative %: 60 %
Platelets: 195 10*3/uL (ref 150–400)
RBC: 3.57 MIL/uL — ABNORMAL LOW (ref 4.22–5.81)
RDW: 13.4 % (ref 11.5–15.5)
WBC: 6.2 10*3/uL (ref 4.0–10.5)
nRBC: 0 % (ref 0.0–0.2)

## 2018-09-25 MED ORDER — DENOSUMAB 120 MG/1.7ML ~~LOC~~ SOLN
120.0000 mg | Freq: Once | SUBCUTANEOUS | Status: AC
  Administered 2018-09-25: 120 mg via SUBCUTANEOUS

## 2018-09-25 MED ORDER — DENOSUMAB 120 MG/1.7ML ~~LOC~~ SOLN
SUBCUTANEOUS | Status: AC
  Filled 2018-09-25: qty 1.7

## 2018-09-25 MED ORDER — INFLUENZA VAC SPLIT QUAD 0.5 ML IM SUSY
0.5000 mL | PREFILLED_SYRINGE | Freq: Once | INTRAMUSCULAR | Status: AC
  Administered 2018-09-25: 0.5 mL via INTRAMUSCULAR

## 2018-09-25 MED FILL — NINLARO 3 MG CAPSULE: 3 | 28 days supply | Qty: 3 | Fill #1

## 2018-09-25 NOTE — Patient Instructions (Signed)

## 2018-09-26 LAB — VITAMIN D 25 HYDROXY (VIT D DEFICIENCY, FRACTURES): VIT D 25 HYDROXY: 33.5 ng/mL (ref 30.0–100.0)

## 2018-09-27 ENCOUNTER — Ambulatory Visit: Payer: BLUE CROSS/BLUE SHIELD

## 2018-10-07 ENCOUNTER — Ambulatory Visit: Payer: BLUE CROSS/BLUE SHIELD

## 2018-10-15 ENCOUNTER — Telehealth: Payer: Self-pay

## 2018-10-15 NOTE — Telephone Encounter (Signed)
Received fax from [place] at [facility] regarding pt plan to proceed with BMT. Pt holding ninlaro as of 10/14/18. Called pt to confirm that he did stop his ninlaro yesterday. Pt confirmed and updated on 10/28/18 appts with Dr. Irene Limbo as requested. Projected Dates copied and placed in folder to be scanned.

## 2018-10-25 ENCOUNTER — Ambulatory Visit: Payer: BLUE CROSS/BLUE SHIELD

## 2018-10-28 ENCOUNTER — Encounter: Payer: Self-pay | Admitting: Hematology

## 2018-10-28 ENCOUNTER — Inpatient Hospital Stay (HOSPITAL_BASED_OUTPATIENT_CLINIC_OR_DEPARTMENT_OTHER): Payer: BLUE CROSS/BLUE SHIELD | Admitting: Hematology

## 2018-10-28 ENCOUNTER — Inpatient Hospital Stay: Payer: BLUE CROSS/BLUE SHIELD | Attending: Hematology

## 2018-10-28 ENCOUNTER — Inpatient Hospital Stay: Payer: BLUE CROSS/BLUE SHIELD

## 2018-10-28 ENCOUNTER — Telehealth: Payer: Self-pay | Admitting: Hematology

## 2018-10-28 VITALS — BP 137/89 | HR 80 | Temp 97.5°F | Resp 18 | Ht 68.0 in | Wt 175.6 lb

## 2018-10-28 DIAGNOSIS — F17210 Nicotine dependence, cigarettes, uncomplicated: Secondary | ICD-10-CM

## 2018-10-28 DIAGNOSIS — R0602 Shortness of breath: Secondary | ICD-10-CM | POA: Diagnosis not present

## 2018-10-28 DIAGNOSIS — R5383 Other fatigue: Secondary | ICD-10-CM | POA: Insufficient documentation

## 2018-10-28 DIAGNOSIS — N400 Enlarged prostate without lower urinary tract symptoms: Secondary | ICD-10-CM | POA: Diagnosis not present

## 2018-10-28 DIAGNOSIS — N289 Disorder of kidney and ureter, unspecified: Secondary | ICD-10-CM

## 2018-10-28 DIAGNOSIS — D630 Anemia in neoplastic disease: Secondary | ICD-10-CM

## 2018-10-28 DIAGNOSIS — R682 Dry mouth, unspecified: Secondary | ICD-10-CM

## 2018-10-28 DIAGNOSIS — M79605 Pain in left leg: Secondary | ICD-10-CM | POA: Insufficient documentation

## 2018-10-28 DIAGNOSIS — R42 Dizziness and giddiness: Secondary | ICD-10-CM | POA: Insufficient documentation

## 2018-10-28 DIAGNOSIS — Z79899 Other long term (current) drug therapy: Secondary | ICD-10-CM | POA: Insufficient documentation

## 2018-10-28 DIAGNOSIS — C9000 Multiple myeloma not having achieved remission: Secondary | ICD-10-CM

## 2018-10-28 DIAGNOSIS — B1920 Unspecified viral hepatitis C without hepatic coma: Secondary | ICD-10-CM | POA: Diagnosis not present

## 2018-10-28 DIAGNOSIS — Z5111 Encounter for antineoplastic chemotherapy: Secondary | ICD-10-CM | POA: Insufficient documentation

## 2018-10-28 DIAGNOSIS — I10 Essential (primary) hypertension: Secondary | ICD-10-CM | POA: Diagnosis not present

## 2018-10-28 DIAGNOSIS — C9010 Plasma cell leukemia not having achieved remission: Secondary | ICD-10-CM | POA: Insufficient documentation

## 2018-10-28 LAB — CBC WITH DIFFERENTIAL/PLATELET
ABS IMMATURE GRANULOCYTES: 0.02 10*3/uL (ref 0.00–0.07)
BASOS ABS: 0.1 10*3/uL (ref 0.0–0.1)
BASOS PCT: 1 %
EOS ABS: 0.1 10*3/uL (ref 0.0–0.5)
Eosinophils Relative: 2 %
HEMATOCRIT: 34.7 % — AB (ref 39.0–52.0)
Hemoglobin: 11.6 g/dL — ABNORMAL LOW (ref 13.0–17.0)
IMMATURE GRANULOCYTES: 0 %
LYMPHS ABS: 1.4 10*3/uL (ref 0.7–4.0)
Lymphocytes Relative: 22 %
MCH: 31.4 pg (ref 26.0–34.0)
MCHC: 33.4 g/dL (ref 30.0–36.0)
MCV: 93.8 fL (ref 80.0–100.0)
MONOS PCT: 14 %
Monocytes Absolute: 0.9 10*3/uL (ref 0.1–1.0)
NEUTROS PCT: 61 %
NRBC: 0 % (ref 0.0–0.2)
Neutro Abs: 4 10*3/uL (ref 1.7–7.7)
PLATELETS: 232 10*3/uL (ref 150–400)
RBC: 3.7 MIL/uL — ABNORMAL LOW (ref 4.22–5.81)
RDW: 12.9 % (ref 11.5–15.5)
WBC: 6.4 10*3/uL (ref 4.0–10.5)

## 2018-10-28 LAB — CMP (CANCER CENTER ONLY)
ALBUMIN: 4 g/dL (ref 3.5–5.0)
ALT: 12 U/L (ref 0–44)
AST: 13 U/L — AB (ref 15–41)
Alkaline Phosphatase: 68 U/L (ref 38–126)
Anion gap: 8 (ref 5–15)
BILIRUBIN TOTAL: 0.2 mg/dL — AB (ref 0.3–1.2)
BUN: 27 mg/dL — AB (ref 8–23)
CO2: 24 mmol/L (ref 22–32)
Calcium: 9 mg/dL (ref 8.9–10.3)
Chloride: 110 mmol/L (ref 98–111)
Creatinine: 2.25 mg/dL — ABNORMAL HIGH (ref 0.61–1.24)
GFR, EST NON AFRICAN AMERICAN: 30 mL/min — AB (ref >60)
GFR, Est AFR Am: 34 mL/min — ABNORMAL LOW (ref >60)
GLUCOSE: 79 mg/dL (ref 70–99)
Potassium: 4.4 mmol/L (ref 3.5–5.1)
Sodium: 142 mmol/L (ref 135–145)
TOTAL PROTEIN: 7.4 g/dL (ref 6.5–8.1)

## 2018-10-28 MED ORDER — DENOSUMAB 120 MG/1.7ML ~~LOC~~ SOLN
SUBCUTANEOUS | Status: AC
  Filled 2018-10-28: qty 1.7

## 2018-10-28 MED ORDER — DEXAMETHASONE 4 MG PO TABS: 20.0000 mg | ORAL_TABLET | ORAL | 1 refills | Status: DC

## 2018-10-28 MED ORDER — DENOSUMAB 120 MG/1.7ML ~~LOC~~ SOLN
120.0000 mg | Freq: Once | SUBCUTANEOUS | Status: AC
  Administered 2018-10-28: 120 mg via SUBCUTANEOUS

## 2018-10-28 MED ORDER — CYCLOPHOSPHAMIDE 50 MG PO CAPS: 300.0000 mg/m2 | ORAL_CAPSULE | ORAL | 1 refills | Status: DC

## 2018-10-28 NOTE — Progress Notes (Signed)
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 His labs today 12/14/2017 show hgb 8.3, platelets 524k. He is doing well overall. He has staples in his right upper leg following a recent surgery and has not been in touch with the surgeons for a f/u of this. He is not receiving physical therapy and is not getting around at home.  He notes that he was not scheduled for a post hospitalization nephrology f/u and has no PCP.  On review of systems, pt reports intermittent fatigue, SOB, pain to the left leg, dizziness, dry mouth and denies fever, chills, night sweats, dysuria and any other accompanying symptoms.   INTERVAL HISTORY:    Andres Fernandez presents today for follow up and continue management of his myeloma/plasma cell leukemia.The patient's last visit with [name] was on 09/25/18. The pt reports that he is doing well overall.   The pt did see Dr. Blinda Leatherwood for transplant consideration on 10/23/18 at [facility]. He has been advised to complete tandem transplants in December 2019 and March 2020. He stopped Ninlaro in anticipation of restarting CyBorD.   The pt reports that he has not had any new concerns in the interim and is hopeful to begin transplant. He continues endorsing good energy levels.   Lab results today (10/28/18) of CBC w/diff and CMP is as follows: all values are WNL except for RBC at 3.70, HGB at 11.6, HCT at 34.7, BUN at 27, Creatinine at 2.25, AST at 13, Total Bilirubin at 0.2, GFR at 34. 10/28/18 MMP and SFLC are pending  On review of systems, pt reports good energy levels, and denies leg swelling, fevers, chills, new bone pains, and any other symptoms.    REVIEW OF SYSTEMS:    A 10+ POINT REVIEW OF SYSTEMS WAS OBTAINED  including neurology, dermatology, psychiatry, cardiac, respiratory, lymph, extremities, GI, GU, Musculoskeletal, constitutional, breasts, reproductive, HEENT.  All pertinent positives are noted in the HPI.  All others are negative.   MEDICAL HISTORY:  Past Medical History:  Diagnosis Date  . BPH (benign prostatic hyperplasia)   . Hepatitis C 11/27/2017  . Hypertension   . 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 Tobacco abuse     SURGICAL HISTORY: Past Surgical History:  Procedure Laterality Date  . APPENDECTOMY    . FEMUR IM NAIL Right 11/20/2017   Procedure: RIGHT FEMORAL INTRAMEDULLARY (IM) NAIL;  Surgeon: Nicholes Stairs, MD;  Location: [place];  Service: Orthopedics;  Laterality: Right;    SOCIAL HISTORY: Social History   Socioeconomic History  . Marital status: Single    Spouse name: Not on file  . Number of children: Not on file  . Years of education: Not on file  . Highest education level: Not on file  Occupational History  . Not on file  Social Needs  . Financial resource strain: Not on file  . Food insecurity:    Worry: Not on file    Inability: Not on file  . Transportation needs:    Medical: Not on file    Non-medical: Not on file  Tobacco Use  . Smoking status: Current Every Day Smoker    Packs/day: 0.25    Types: Cigarettes    Last attempt to quit: 11/09/2017  Years since quitting: 0.9  . Smokeless tobacco: Never Used  . Tobacco comment: 1 cigarette/day as of 09/25/18  Substance and Sexual Activity  . Alcohol use: Yes  . Drug use: No  . Sexual activity: Not on file  Lifestyle  . Physical activity:    Days per week: Not on file    Minutes per session: Not on file  . Stress: Not on file  Relationships  . Social connections:    Talks on phone: Not on file    Gets together: Not on file    Attends religious service: Not on file    Active member of club or organization: Not on file    Attends meetings of clubs or organizations: Not on file      Relationship status: Not on file  . Intimate partner violence:    Fear of current or ex partner: Not on file    Emotionally abused: Not on file    Physically abused: Not on file    Forced sexual activity: Not on file  Other Topics Concern  . Not on file  Social History Narrative  . Not on file    FAMILY HISTORY: Family History  Problem Relation Age of Onset  . COPD Mother   . Liver cancer Mother     ALLERGIES:  has No Known Allergies.  MEDICATIONS:  . Current Outpatient Medications on File Prior to Visit  Medication Sig Dispense Refill  . acyclovir (ZOVIRAX) 400 MG tablet TAKE 1 TABLET BY MOUTH EVERY DAY 30 tablet 6  . cholecalciferol (VITAMIN D) 400 units TABS tablet Take 400 Units by mouth daily.    . nicotine (NICODERM CQ - DOSED IN MG/24 HOURS) 14 mg/24hr patch PLACE 1 PATCH (14 MG TOTAL) ONTO THE SKIN DAILY. 28 patch 1  . ondansetron (ZOFRAN) 4 MG tablet Take 2 tablets (8 mg total) by mouth every 8 (eight) hours as needed for nausea. (Patient not taking: Reported on 09/25/2018) 30 tablet 0   Current Facility-Administered Medications on File Prior to Visit  Medication Dose Route Frequency Provider Last Rate Last Dose  . denosumab (XGEVA) injection 120 mg  120 mg Subcutaneous Once Brunetta Genera, MD        PHYSICAL EXAMINATION:  VS reviewed Vitals:   10/28/18 1342  BP: 137/89  Pulse: 80  Resp: 18  Temp: (!) 97.5 F (36.4 C)  SpO2: 99%     GENERAL:alert, in no acute distress and comfortable SKIN: no acute rashes, no significant lesions EYES: conjunctiva are pink and non-injected, sclera anicteric OROPHARYNX: MMM, no exudates, no oropharyngeal erythema or ulceration NECK: supple, no JVD LYMPH:  no palpable lymphadenopathy in the cervical, axillary or inguinal regions LUNGS: clear to auscultation b/l with normal respiratory effort HEART: regular rate & rhythm ABDOMEN:  normoactive bowel sounds , non tender, not distended. No palpable  hepatosplenomegaly.  Extremity: no pedal edema PSYCH: alert & oriented x 3 with fluent speech NEURO: no focal motor/sensory deficits   LABORATORY DATA:  I have reviewed the data as listed  . CBC Latest Ref Rng & Units 10/28/2018 09/25/2018 08/30/2018  WBC 4.0 - 10.5 K/uL 6.4 6.2 6.4  Hemoglobin 13.0 - 17.0 g/dL 11.6(L) 11.4(L) 11.2(L)  Hematocrit 39.0 - 52.0 % 34.7(L) 32.9(L) 33.2(L)  Platelets 150 - 400 K/uL 232 195 241   . CBC    Component Value Date/Time   WBC 6.4 10/28/2018 1254   RBC 3.70 (L) 10/28/2018 1254   HGB 11.6 (L) 10/28/2018 1254  HGB 11.2 (L) 08/30/2018 0832   HGB 8.3 (L) 12/14/2017 1230   HCT 34.7 (L) 10/28/2018 1254   HCT 23.9 (L) 12/14/2017 1230   PLT 232 10/28/2018 1254   PLT 241 08/30/2018 0832   PLT 524 (H) 12/14/2017 1230   MCV 93.8 10/28/2018 1254   MCV 88.4 12/14/2017 1230   MCH 31.4 10/28/2018 1254   MCHC 33.4 10/28/2018 1254   RDW 12.9 10/28/2018 1254   RDW 15.3 (H) 12/14/2017 1230   LYMPHSABS 1.4 10/28/2018 1254   LYMPHSABS 1.4 12/14/2017 1230   MONOABS 0.9 10/28/2018 1254   MONOABS 0.6 12/14/2017 1230   EOSABS 0.1 10/28/2018 1254   EOSABS 0.1 12/14/2017 1230   BASOSABS 0.1 10/28/2018 1254   BASOSABS 0.1 12/14/2017 1230     . CMP Latest Ref Rng & Units 10/28/2018 09/25/2018 08/30/2018  Glucose 70 - 99 mg/dL 79 83 97  BUN 8 - 23 mg/dL 27(H) 26(H) 25(H)  Creatinine 0.61 - 1.24 mg/dL 2.25(H) 2.04(H) 1.97(H)  Sodium 135 - 145 mmol/L 142 142 146(H)  Potassium 3.5 - 5.1 mmol/L 4.4 4.1 4.6  Chloride 98 - 111 mmol/L 110 111 114(H)  CO2 22 - 32 mmol/L 24 20(L) 22  Calcium 8.9 - 10.3 mg/dL 9.0 9.4 9.2  Total Protein 6.5 - 8.1 g/dL 7.4 6.9 7.0  Total Bilirubin 0.3 - 1.2 mg/dL 0.2(L) 0.3 0.3  Alkaline Phos 38 - 126 U/L 68 56 64  AST 15 - 41 U/L 13(L) 13(L) 11(L)  ALT 0 - 44 U/L _0 08/07/18 BM Bx:       RADIOGRAPHIC STUDIES: I have personally reviewed the radiological images as listed and agreed with the findings in  the report. No results found.  ASSESSMENT & PLAN:   62 y.o. 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 s/p -Hypercalcemia -Renal Failure -Anemia -Extensive Bone lesions -Appears to be primarily Kappa Light chain Myeloma with no overt M spike. Appears to be responding to treatment.- with decreased kappa light chains.   Initial BM Bx showed >95% involvement with kappa restricted serum free light chains. 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 MoL Cy-   08/07/18 BM Bx revealed normocellular bone marrow with trilineage hematopoiesis and 1% plasma cells which indicates the pt is in remission 08/06/18 PET/CT revealed  Expansile lytic lesion involving the left iliac bone, unchanged. Lytic lesion involving the sternum, unchanged. While both are suspicious for myeloma, neither lesion is FDG avid. No hypermetabolic osseous lesions in the visualized axial and appendicular skeleton. Please note that the bilateral lower extremities were not imaged. No suspicious lymphadenopathy. Spleen is normal in size.    PLAN: -Advised that the pt return to care with ID before October for management of his Hep C as his 07/08/18 Hep C, RNA lab showed that the virus was detected within 3 moths after completing treatment. -Follow up with PCP Dr Christa See  -Pt began Ninlaro 53m maintenance after pt finished 13 cycles of CyBorD and BM bx showed only 1% plasma cells -Discussed pt labwork today, 10/28/18; Creatinine slightly worsened to 2.25, blood counts and chemistries are otherwise stable  -Discussed the recommendation from Dr. RNorma Fredricksonthat the pt resume CyBorD, and stop Ninlaro, in anticipation of his tandem bone marrow transplants beginning in late December 2019 -Continue Xgeva every 4 weeks  -Continue Acyclovir and Vitamin D replacement  -Will see the pt back in  3 weeks    2) s/p Prophylactic IM nailing for rt  femur  completed Physical Therapy at the [facility].  -patient previously reported improvement and is currently progressively back to full time work.  3) Thrombocytopenia resolved   4) Renal insufficiency -- likely primarily related to Myeloma multifactorial MM/TLS/sepsis/antibiotics.Primary light chain nephrology Plan -maintain follow up with nephrology referral to Dr Marval Regal [facility] for continued treatment -Pt will complete 8 weeks of maverick for hep c with Dr Linus Salmons on 07/08/18.  -Kidney numbers improved. Creatinine in the 1.7-2.2 range   5) h/o tobacco abuse-counseled on smoking cessation - notes that he has quit smoking  6) h/o cocaine and ETOH abuse -- sober for >3 yrs per patient.  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 -Hgb improved to 11.2   -monitor   Please schedule to restart CyBOrD ASAP this week as per orders RTC with Dr Irene Limbo in 3 weeks Labs D1 and D8 of each cycle    The total time spent in the appt was 30 minutes and more than 50% was on counseling and direct patient cares.   Sullivan Lone MD [place] AAHIVMS [facility] Hematology/Oncology Physician [facility]  (Office):       [phone] (Work cell):  [phone] (Fax):           [phone]  I, Baldwin Jamaica, am acting as a scribe for Dr. Sullivan Lone.   .I have reviewed the above documentation for accuracy and completeness, and I agree with the above. Brunetta Genera MD

## 2018-10-28 NOTE — Patient Instructions (Signed)

## 2018-10-28 NOTE — Telephone Encounter (Signed)
Patient will return 11/20 so we can finish scheduling his appointments from per 11/18 los.

## 2018-10-28 NOTE — Patient Instructions (Signed)
Thank you for choosing [facility] to provide your oncology and hematology care.  To afford each patient quality time with our providers, please arrive 30 minutes before your scheduled appointment time.  If you arrive late for your appointment, you may be asked to reschedule.  We strive to give you quality time with our providers, and arriving late affects you and other patients whose appointments are after yours.    If you are a no show for multiple scheduled visits, you may be dismissed from the clinic at the providers discretion.     Again, thank you for choosing [facility], our hope is that these requests will decrease the amount of time that you wait before being seen by our physicians.  ______________________________________________________________________   Should you have questions after your visit to the [facility], please contact our office at [phone] between the hours of 8:30 and 4:30 p.m.    Voicemails left after 4:30p.m will not be returned until the following business day.     For prescription refill requests, please have your pharmacy contact us directly.  Please also try to allow 48 hours for prescription requests.     Please contact the scheduling department for questions regarding scheduling.  For scheduling of procedures such as PET scans, CT scans, MRI, Ultrasound, etc please contact central scheduling at [phone].     Resources For Cancer Patients and Caregivers:    Oncolink.org:  A wonderful resource for patients and healthcare providers for information regarding your disease, ways to tract your treatment, what to expect, etc.      American Cancer Society:  800-227-2345  Can help patients locate various types of support and financial assistance   Cancer Care: 1-800-813-HOPE (4673) Provides financial assistance, online support groups, medication/co-pay assistance.     [place] DSS:  336-641-3447 Where to apply  for food stamps, Medicaid, and utility assistance   Medicare Rights Center: 800-333-4114 Helps people with Medicare understand their rights and benefits, navigate the Medicare system, and secure the quality healthcare they deserve   SCAT: 336-333-6589 [place] [employer]'s shared-ride transportation service for eligible riders who have a disability that prevents them from riding the fixed route bus.     For additional information on assistance programs please contact our social worker:   Abigail Elmore:  [phone]  

## 2018-10-29 LAB — KAPPA/LAMBDA LIGHT CHAINS
Kappa free light chain: 86.6 mg/L — ABNORMAL HIGH (ref 3.3–19.4)
Kappa, lambda light chain ratio: 4.78 — ABNORMAL HIGH (ref 0.26–1.65)
Lambda free light chains: 18.1 mg/L (ref 5.7–26.3)

## 2018-10-30 ENCOUNTER — Inpatient Hospital Stay: Payer: BLUE CROSS/BLUE SHIELD

## 2018-10-30 VITALS — BP 146/89 | HR 81 | Temp 97.9°F | Resp 18

## 2018-10-30 DIAGNOSIS — C9010 Plasma cell leukemia not having achieved remission: Secondary | ICD-10-CM | POA: Diagnosis not present

## 2018-10-30 LAB — MULTIPLE MYELOMA PANEL, SERUM
ALBUMIN/GLOB SERPL: 1.5 (ref 0.7–1.7)
Albumin SerPl Elph-Mcnc: 3.9 g/dL (ref 2.9–4.4)
Alpha 1: 0.2 g/dL (ref 0.0–0.4)
Alpha2 Glob SerPl Elph-Mcnc: 1 g/dL (ref 0.4–1.0)
B-Globulin SerPl Elph-Mcnc: 0.8 g/dL (ref 0.7–1.3)
Gamma Glob SerPl Elph-Mcnc: 0.7 g/dL (ref 0.4–1.8)
Globulin, Total: 2.7 g/dL (ref 2.2–3.9)
IGA: 27 mg/dL — AB (ref 61–437)
IGG (IMMUNOGLOBIN G), SERUM: 792 mg/dL (ref 700–1600)
IGM (IMMUNOGLOBULIN M), SRM: 49 mg/dL (ref 20–172)
TOTAL PROTEIN ELP: 6.6 g/dL (ref 6.0–8.5)

## 2018-10-30 MED ORDER — ONDANSETRON HCL 8 MG PO TABS
ORAL_TABLET | ORAL | Status: AC
  Filled 2018-10-30: qty 1

## 2018-10-30 MED ORDER — BORTEZOMIB CHEMO SQ INJECTION 3.5 MG (2.5MG/ML)
1.3000 mg/m2 | Freq: Once | INTRAMUSCULAR | Status: AC
  Administered 2018-10-30: 2.5 mg via SUBCUTANEOUS
  Filled 2018-10-30: qty 1

## 2018-10-30 MED ORDER — ONDANSETRON HCL 8 MG PO TABS
4.0000 mg | ORAL_TABLET | Freq: Once | ORAL | Status: AC
  Administered 2018-10-30: 4 mg via ORAL

## 2018-10-30 NOTE — Progress Notes (Signed)
Pt has gained weight.  Dose of Velcade will be adjusted up to 2.5 mg per MD. Kennith Center, Pharm.D., CPP 10/30/2018@8 :54 AM

## 2018-10-30 NOTE — Patient Instructions (Signed)
[facility] Discharge Instructions for Patients Receiving Chemotherapy  Today you received the following chemotherapy agents: Bortezomib (Velcade)  To help prevent nausea and vomiting after your treatment, we encourage you to take your nausea medication as directed.    If you develop nausea and vomiting that is not controlled by your nausea medication, call the clinic.   BELOW ARE SYMPTOMS THAT SHOULD BE REPORTED IMMEDIATELY:  *FEVER GREATER THAN 100.5 F  *CHILLS WITH OR WITHOUT FEVER  NAUSEA AND VOMITING THAT IS NOT CONTROLLED WITH YOUR NAUSEA MEDICATION  *UNUSUAL SHORTNESS OF BREATH  *UNUSUAL BRUISING OR BLEEDING  TENDERNESS IN MOUTH AND THROAT WITH OR WITHOUT PRESENCE OF ULCERS  *URINARY PROBLEMS  *BOWEL PROBLEMS  UNUSUAL RASH Items with * indicate a potential emergency and should be followed up as soon as possible.  Feel free to call the clinic should you have any questions or concerns. The clinic phone number is [phone].  Please show the CHEMO ALERT CARD at check-in to the Emergency Department and triage nurse.   

## 2018-10-30 NOTE — Progress Notes (Signed)
OK to tx with Creatinine 2.25 per Dr. Irene Limbo

## 2018-10-31 ENCOUNTER — Telehealth: Payer: Self-pay | Admitting: *Deleted

## 2018-10-31 ENCOUNTER — Telehealth: Payer: Self-pay | Admitting: Hematology

## 2018-10-31 NOTE — Telephone Encounter (Addendum)
Call from [facility] w/[facility] Bone Marrow Transplant. Patient is getting ready for transplant. Received direction from [facility] via fax Nov 5 for patient to stop Ninlaro. Patient was contacted and confirmed would stop.  Per Wells Guiles,  patient informed her he had Velcade injection yesterday. Needs to have all treatments on hold. Dr. Irene Limbo informed. Future appointments for Velcade injections cancelled at this time.

## 2018-10-31 NOTE — Telephone Encounter (Signed)
Cancelled appt per 11/21 sch message- unable to reach pt.

## 2018-11-01 ENCOUNTER — Telehealth: Payer: Self-pay | Admitting: *Deleted

## 2018-11-01 NOTE — Telephone Encounter (Signed)
Error

## 2018-11-25 ENCOUNTER — Ambulatory Visit: Payer: BLUE CROSS/BLUE SHIELD

## 2018-11-29 ENCOUNTER — Ambulatory Visit: Payer: BLUE CROSS/BLUE SHIELD

## 2018-12-17 ENCOUNTER — Telehealth: Payer: Self-pay | Admitting: *Deleted

## 2018-12-17 NOTE — Telephone Encounter (Signed)
Follow up on patient's call to scheduling. Patient called to find out when he needed to see Dr. Irene Limbo. Contacted Ms. Dameron in Dr. Cherre Huger office at [facility]. She states patient is to see Dr. Irene Limbo for "check in" appt 60 days after first transplant cycle - which will be 01/18/19. Notified scheduling to contact patient and make this appt.

## 2018-12-18 ENCOUNTER — Telehealth: Payer: Self-pay | Admitting: Hematology

## 2018-12-18 NOTE — Telephone Encounter (Signed)
Called regarding 2/10

## 2018-12-23 ENCOUNTER — Ambulatory Visit: Payer: BLUE CROSS/BLUE SHIELD

## 2019-01-02 ENCOUNTER — Telehealth: Payer: Self-pay | Admitting: *Deleted

## 2019-01-02 NOTE — Telephone Encounter (Signed)
Contacted by Andres Fernandez in Dr. Norma Fredrickson office. Faxing over updated clinical information for patient. Patient has completed first transplant of tandem transplant. 2nd transplant scheduled for 100-120 days following completion of first: during March/April 2020. Patient treatment schedule is included in fax. Fax received and information given to Dr. Irene Limbo.

## 2019-01-17 NOTE — Progress Notes (Signed)
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 His labs today 12/14/2017 show hgb 8.3, platelets 524k. He is doing well overall. He has staples in his right upper leg following a recent surgery and has not been in touch with the surgeons for a f/u of this. He is not receiving physical therapy and is not getting around at home.  He notes that he was not scheduled for a post hospitalization nephrology f/u and has no PCP.  On review of systems, pt reports intermittent fatigue, SOB, pain to the left leg, dizziness, dry mouth and denies fever, chills, night sweats, dysuria and any other accompanying symptoms.   INTERVAL HISTORY:    Andres Fernandez presents today for follow up and continue management of his myeloma/plasma cell leukemia. The patient's last visit with [name] was on 10/28/18. The pt reports that he is doing well overall.   The pt reports that he is feeling good about 62 days out from his first autologous transplant which was completed on 11/19/18. The plan has been for the pt to obtain a tandem transplant, and will be returning to care with Dr. Norma Fredrickson at [facility] near day 100. He notes that he had diarrhea during the interim but notes that this has resolved. He is intending to return to work tomorrow. He notes that he has been avoiding crowds and denies concerns for infection. The pt notes that he has been eating well and has been holding his weight well. He denies fevers or night sweats. The pt notes that his energy levels "go up and down, sometimes good, sometimes bad."  The pt is wearing nicotine patches and notes that he is down to 2 cigarettes a day, noting that he intends to continue towards absolute cessation.  Lab results today (01/20/19) of  CBC w/diff is as follows: all values are WNL except for RBC at 3.74, HGB at 11.6, HCT at 34.7.  On review of systems, pt reports fluctuating energy levels, eating well, stable weight, and denies diarrhea, concerns for infections, fevers, night sweats, mouth sores, changes in breathing, abdominal pains, leg swelling, and any other symptoms.    REVIEW OF SYSTEMS:    A 10+ POINT REVIEW OF SYSTEMS WAS OBTAINED including neurology, dermatology, psychiatry, cardiac, respiratory, lymph, extremities, GI, GU, Musculoskeletal, constitutional, breasts, reproductive, HEENT.  All pertinent positives are noted in the HPI.  All others are negative.   MEDICAL HISTORY:  Past Medical History:  Diagnosis Date  . BPH (benign prostatic hyperplasia)   . Hepatitis C 11/27/2017  . Hypertension   . 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 Tobacco abuse     SURGICAL HISTORY: Past Surgical History:  Procedure Laterality Date  . APPENDECTOMY    . FEMUR IM NAIL Right 11/20/2017   Procedure: RIGHT FEMORAL INTRAMEDULLARY (IM) NAIL;  Surgeon: Nicholes Stairs, MD;  Location: [facility];  Service: Orthopedics;  Laterality: Right;    SOCIAL HISTORY: Social History   Socioeconomic History  . Marital status: Single    Spouse name: Not on file  . Number of children: Not on file  . Years of education: Not on file  . Highest education level: Not on file  Occupational History  . Not on file  Social Needs  . Financial  resource strain: Not on file  . Food insecurity:    Worry: Not on file    Inability: Not on file  . Transportation needs:    Medical: Not on file    Non-medical: Not on file  Tobacco Use  . Smoking status: Current Every Day Smoker    Packs/day: 0.25    Types: Cigarettes    Last attempt to quit: 11/09/2017    Years since quitting: 1.1  . Smokeless tobacco: Never Used  . Tobacco comment: 1 cigarette/day as of 09/25/18  Substance and Sexual Activity  . Alcohol use: Yes  . Drug use: No  .  Sexual activity: Not on file  Lifestyle  . Physical activity:    Days per week: Not on file    Minutes per session: Not on file  . Stress: Not on file  Relationships  . Social connections:    Talks on phone: Not on file    Gets together: Not on file    Attends religious service: Not on file    Active member of club or organization: Not on file    Attends meetings of clubs or organizations: Not on file    Relationship status: Not on file  . Intimate partner violence:    Fear of current or ex partner: Not on file    Emotionally abused: Not on file    Physically abused: Not on file    Forced sexual activity: Not on file  Other Topics Concern  . Not on file  Social History Narrative  . Not on file    FAMILY HISTORY: Family History  Problem Relation Age of Onset  . COPD Mother   . Liver cancer Mother     ALLERGIES:  has No Known Allergies.  MEDICATIONS:  . Current Outpatient Medications on File Prior to Visit  Medication Sig Dispense Refill  . acyclovir (ZOVIRAX) 400 MG tablet TAKE 1 TABLET BY MOUTH EVERY DAY 30 tablet 6  . cholecalciferol (VITAMIN D) 400 units TABS tablet Take 400 Units by mouth daily.    . cyclophosphamide (CYTOXAN) 50 MG capsule Take 12 capsules (600 mg total) by mouth once a week. Give on an empty stomach 1 hour before or 2 hours after meals. 36 capsule 1  . dexamethasone (DECADRON) 4 MG tablet Take 5 tablets (20 mg total) by mouth once a week. 20 tablet 1  . nicotine (NICODERM CQ - DOSED IN MG/24 HOURS) 14 mg/24hr patch PLACE 1 PATCH (14 MG TOTAL) ONTO THE SKIN DAILY. 28 patch 1  . ondansetron (ZOFRAN) 4 MG tablet Take 2 tablets (8 mg total) by mouth every 8 (eight) hours as needed for nausea. (Patient not taking: Reported on 09/25/2018) 30 tablet 0   No current facility-administered medications on file prior to visit.     PHYSICAL EXAMINATION:  VS reviewed Vitals:   01/20/19 1347  BP: (!) 125/93  Pulse: (!) 103  Resp: 18  Temp: 97.9 F (36.6  C)  SpO2: 100%     GENERAL:alert, in no acute distress and comfortable SKIN: no acute rashes, no significant lesions EYES: conjunctiva are pink and non-injected, sclera anicteric OROPHARYNX: MMM, no exudates, no oropharyngeal erythema or ulceration NECK: supple, no JVD LYMPH:  no palpable lymphadenopathy in the cervical, axillary or inguinal regions LUNGS: clear to auscultation b/l with normal respiratory effort HEART: regular rate & rhythm ABDOMEN:  normoactive bowel sounds , non tender, not distended. No palpable hepatosplenomegaly.  Extremity: no pedal edema PSYCH: alert & oriented x  3 with fluent speech NEURO: no focal motor/sensory deficits   LABORATORY DATA:  I have reviewed the data as listed  . CBC Latest Ref Rng & Units 01/20/2019 10/28/2018 09/25/2018  WBC 4.0 - 10.5 K/uL 8.9 6.4 6.2  Hemoglobin 13.0 - 17.0 g/dL 11.6(L) 11.6(L) 11.4(L)  Hematocrit 39.0 - 52.0 % 34.7(L) 34.7(L) 32.9(L)  Platelets 150 - 400 K/uL 208 232 195   . CBC    Component Value Date/Time   WBC 8.9 01/20/2019 1319   WBC 6.4 10/28/2018 1254   RBC 3.74 (L) 01/20/2019 1319   HGB 11.6 (L) 01/20/2019 1319   HGB 8.3 (L) 12/14/2017 1230   HCT 34.7 (L) 01/20/2019 1319   HCT 23.9 (L) 12/14/2017 1230   PLT 208 01/20/2019 1319   PLT 524 (H) 12/14/2017 1230   MCV 92.8 01/20/2019 1319   MCV 88.4 12/14/2017 1230   MCH 31.0 01/20/2019 1319   MCHC 33.4 01/20/2019 1319   RDW 15.5 01/20/2019 1319   RDW 15.3 (H) 12/14/2017 1230   LYMPHSABS 1.8 01/20/2019 1319   LYMPHSABS 1.4 12/14/2017 1230   MONOABS 1.0 01/20/2019 1319   MONOABS 0.6 12/14/2017 1230   EOSABS 0.1 01/20/2019 1319   EOSABS 0.1 12/14/2017 1230   BASOSABS 0.1 01/20/2019 1319   BASOSABS 0.1 12/14/2017 1230     . CMP Latest Ref Rng & Units 01/20/2019 10/28/2018 09/25/2018  Glucose 70 - 99 mg/dL 87 79 83  BUN 8 - 23 mg/dL 22 27(H) 26(H)  Creatinine 0.61 - 1.24 mg/dL 1.97(H) 2.25(H) 2.04(H)  Sodium 135 - 145 mmol/L 143 142 142    Potassium 3.5 - 5.1 mmol/L 4.5 4.4 4.1  Chloride 98 - 111 mmol/L 111 110 111  CO2 22 - 32 mmol/L 23 24 20(L)  Calcium 8.9 - 10.3 mg/dL 9.1 9.0 9.4  Total Protein 6.5 - 8.1 g/dL 7.3 7.4 6.9  Total Bilirubin 0.3 - 1.2 mg/dL 0.4 0.2(L) 0.3  Alkaline Phos 38 - 126 U/L 60 68 56  AST 15 - 41 U/L 10(L) 13(L) 13(L)  ALT 0 - 44 U/L _0 08/07/18 BM Bx:       RADIOGRAPHIC STUDIES: I have personally reviewed the radiological images as listed and agreed with the findings in the report. No results found.  ASSESSMENT & PLAN:   63 y.o. 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 s/p -Hypercalcemia -Renal Failure -Anemia -Extensive Bone lesions -Appears to be primarily Kappa Light chain Myeloma with no overt M spike. Appears to be responding to treatment.- with decreased kappa light chains.   Initial BM Bx showed >95% involvement with kappa restricted serum free light chains. 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 MoL Cy-   08/07/18 BM Bx revealed normocellular bone marrow with trilineage hematopoiesis and 1% plasma cells which indicates the pt is in remission 08/06/18 PET/CT revealed  Expansile lytic lesion involving the left iliac bone, unchanged. Lytic lesion involving the sternum, unchanged. While both are suspicious for myeloma, neither lesion is FDG avid. No hypermetabolic osseous lesions in the visualized axial and appendicular skeleton. Please note that the bilateral lower extremities were not imaged. No suspicious lymphadenopathy. Spleen is normal in size.    Pt began Ninlaro 68m maintenance after pt finished 13 cycles of CyBorD and BM bx showed only 1% plasma cells, then resumed CyBorD and held Ninlaro in anticipation of his tandem bone marrow transplants beginning in late December  2019 with Dr. Blinda Leatherwood at [facility]   11/19/18 High dose chemotherapy with melphalan 179m/m2 and  autologous stem cell rescue  PLAN: -Discussed pt labwork today, 01/20/19; blood counts holding well, HGB at 11.6, WBC normal at 8.9k, PLT normal at 208k -Continue avoiding crowds, and abiding by infection prevention strategies He is currently at D+59 post 1st transplant -Recommended that the pt continue to eat well, drink at least 48-64 oz of water each day, and walk 20-30 minutes each day.   -Will see the pt back in 3 weeks [facility] -he is to be considered for a tandem transplant based on response. -Continue Acyclovir and other prophylactic antimicrobial as per transplant team. -Will see the pt back in 3 weeks    2) s/p Prophylactic IM nailing for rt femur  [facility]  -patient previously reported improvement and is currently progressively back to full time work.  3) Thrombocytopenia resolved   4) Renal insufficiency -- likely primarily related to Myeloma multifactorial MM/TLS/sepsis/antibiotics.Primary light chain nephrology Plan -maintain follow up with nephrology referral to Dr CMarval Regal/Narda AmberKidney for continued treatment -Pt completed 8 weeks of maverick for hep c with Dr CLinus Salmonson 07/08/18.  -Kidney numbers improved. 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 -Hgb improved to 11.6   -monitor   RTC with Dr KIrene Limbowith labs in 3 weeks   The total time spent in the appt was 30 minutes and more than 50% was on counseling and direct patient cares.   GSullivan LoneMD MDownsvilleAAHIVMS SJackson NorthCK[facility],TheHematology/Oncology Physician [facility]if[facility] (Office):       [phone](Work cell):  [phone](Fax):           [phone] I, SBaldwin Jamaica am  acting as a scribe for Dr. GSullivan Lone   .I have reviewed the above documentation for accuracy and completeness, and I agree with the above. .Brunetta GeneraMD

## 2019-01-20 ENCOUNTER — Telehealth: Payer: Self-pay | Admitting: *Deleted

## 2019-01-20 ENCOUNTER — Inpatient Hospital Stay: Payer: BLUE CROSS/BLUE SHIELD | Attending: Hematology | Admitting: Hematology

## 2019-01-20 ENCOUNTER — Ambulatory Visit: Payer: BLUE CROSS/BLUE SHIELD

## 2019-01-20 ENCOUNTER — Inpatient Hospital Stay: Payer: BLUE CROSS/BLUE SHIELD

## 2019-01-20 VITALS — BP 125/93 | HR 103 | Temp 97.9°F | Resp 18 | Ht 68.0 in | Wt 166.7 lb

## 2019-01-20 DIAGNOSIS — Z9484 Stem cells transplant status: Secondary | ICD-10-CM | POA: Insufficient documentation

## 2019-01-20 DIAGNOSIS — M79605 Pain in left leg: Secondary | ICD-10-CM | POA: Insufficient documentation

## 2019-01-20 DIAGNOSIS — M899 Disorder of bone, unspecified: Secondary | ICD-10-CM

## 2019-01-20 DIAGNOSIS — D649 Anemia, unspecified: Secondary | ICD-10-CM | POA: Diagnosis not present

## 2019-01-20 DIAGNOSIS — C9000 Multiple myeloma not having achieved remission: Secondary | ICD-10-CM

## 2019-01-20 DIAGNOSIS — F17210 Nicotine dependence, cigarettes, uncomplicated: Secondary | ICD-10-CM | POA: Insufficient documentation

## 2019-01-20 DIAGNOSIS — N400 Enlarged prostate without lower urinary tract symptoms: Secondary | ICD-10-CM

## 2019-01-20 DIAGNOSIS — Z79899 Other long term (current) drug therapy: Secondary | ICD-10-CM | POA: Diagnosis not present

## 2019-01-20 DIAGNOSIS — R42 Dizziness and giddiness: Secondary | ICD-10-CM

## 2019-01-20 DIAGNOSIS — B1920 Unspecified viral hepatitis C without hepatic coma: Secondary | ICD-10-CM

## 2019-01-20 DIAGNOSIS — R5383 Other fatigue: Secondary | ICD-10-CM

## 2019-01-20 DIAGNOSIS — C9010 Plasma cell leukemia not having achieved remission: Secondary | ICD-10-CM | POA: Diagnosis not present

## 2019-01-20 LAB — CBC WITH DIFFERENTIAL (CANCER CENTER ONLY)
Abs Immature Granulocytes: 0.03 10*3/uL (ref 0.00–0.07)
Basophils Absolute: 0.1 10*3/uL (ref 0.0–0.1)
Basophils Relative: 1 %
Eosinophils Absolute: 0.1 10*3/uL (ref 0.0–0.5)
Eosinophils Relative: 2 %
HCT: 34.7 % — ABNORMAL LOW (ref 39.0–52.0)
Hemoglobin: 11.6 g/dL — ABNORMAL LOW (ref 13.0–17.0)
Immature Granulocytes: 0 %
Lymphocytes Relative: 20 %
Lymphs Abs: 1.8 10*3/uL (ref 0.7–4.0)
MCH: 31 pg (ref 26.0–34.0)
MCHC: 33.4 g/dL (ref 30.0–36.0)
MCV: 92.8 fL (ref 80.0–100.0)
Monocytes Absolute: 1 10*3/uL (ref 0.1–1.0)
Monocytes Relative: 11 %
NRBC: 0 % (ref 0.0–0.2)
Neutro Abs: 5.9 10*3/uL (ref 1.7–7.7)
Neutrophils Relative %: 66 %
Platelet Count: 208 10*3/uL (ref 150–400)
RBC: 3.74 MIL/uL — ABNORMAL LOW (ref 4.22–5.81)
RDW: 15.5 % (ref 11.5–15.5)
WBC Count: 8.9 10*3/uL (ref 4.0–10.5)

## 2019-01-20 LAB — CMP (CANCER CENTER ONLY)
ALT: 7 U/L (ref 0–44)
AST: 10 U/L — ABNORMAL LOW (ref 15–41)
Albumin: 4.1 g/dL (ref 3.5–5.0)
Alkaline Phosphatase: 60 U/L (ref 38–126)
Anion gap: 9 (ref 5–15)
BUN: 22 mg/dL (ref 8–23)
CHLORIDE: 111 mmol/L (ref 98–111)
CO2: 23 mmol/L (ref 22–32)
CREATININE: 1.97 mg/dL — AB (ref 0.61–1.24)
Calcium: 9.1 mg/dL (ref 8.9–10.3)
GFR, EST AFRICAN AMERICAN: 41 mL/min — AB (ref >60)
GFR, EST NON AFRICAN AMERICAN: 36 mL/min — AB (ref >60)
Glucose, Bld: 87 mg/dL (ref 70–99)
Potassium: 4.5 mmol/L (ref 3.5–5.1)
Sodium: 143 mmol/L (ref 135–145)
Total Bilirubin: 0.4 mg/dL (ref 0.3–1.2)
Total Protein: 7.3 g/dL (ref 6.5–8.1)

## 2019-01-20 NOTE — Telephone Encounter (Signed)
Per Dr. Irene Limbo, add lab appt for patient today - contact patient and let him know. Patient contacted, he will come today at 1pm for labs. Scheduling message sent. Lab Orders placed per Dr. Irene Limbo directions.

## 2019-01-21 ENCOUNTER — Telehealth: Payer: Self-pay | Admitting: Hematology

## 2019-01-21 NOTE — Telephone Encounter (Signed)
Called and scheduled appt per 2/10 los.  Patient aware of appt date and time.

## 2019-02-01 IMAGING — CT CT HEAD W/O CM
5 of 6 series · 16 of 47 positions shown, 17 images · non-contrast
Comparison: Facial bone CT dated 03/27/2010

CLINICAL DATA: 61-year-old male with numbness and tingling.
Paraplegia.

EXAM:
CT HEAD WITHOUT CONTRAST
TECHNIQUE: Contiguous axial images were obtained from the base of the skull
through the vertex without intravenous contrast.

[Series 3: head without · axial · non-contrast · 0.45mm/px · z∈[-107,-57]mm · 2 of 32 slices shown, 3 images (1 of 2)]
[im 11/32  brain]
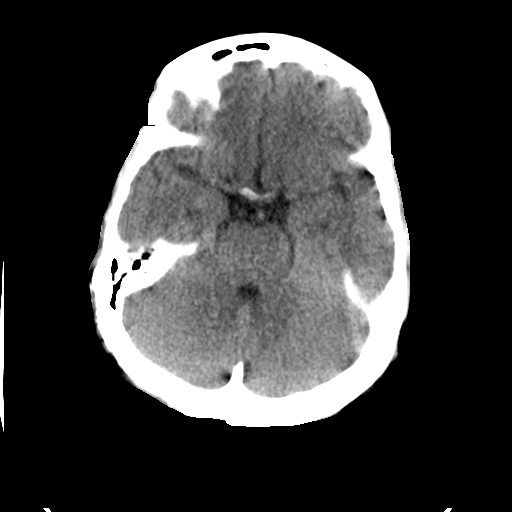
[im 11/32  bone]
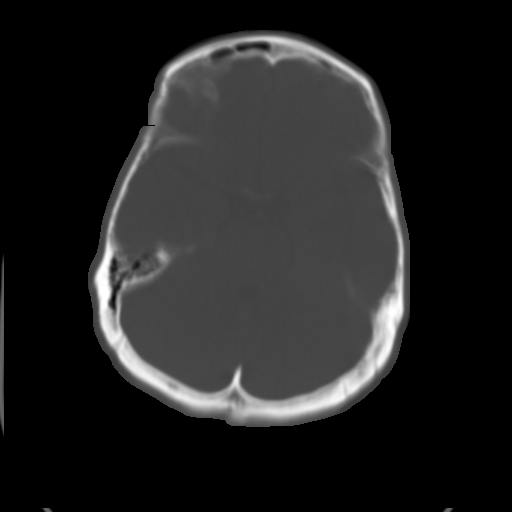
[im 21/32  brain]
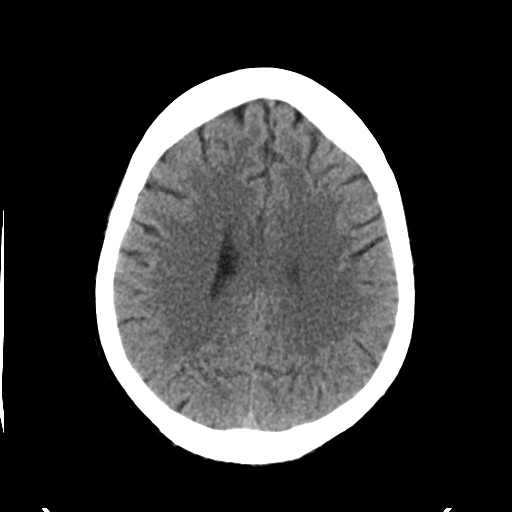

[Series 4: head bone · axial · 0.45mm/px · z∈[-143,-47]mm · 6 of 80 slices shown]
[im 8/80  bone]
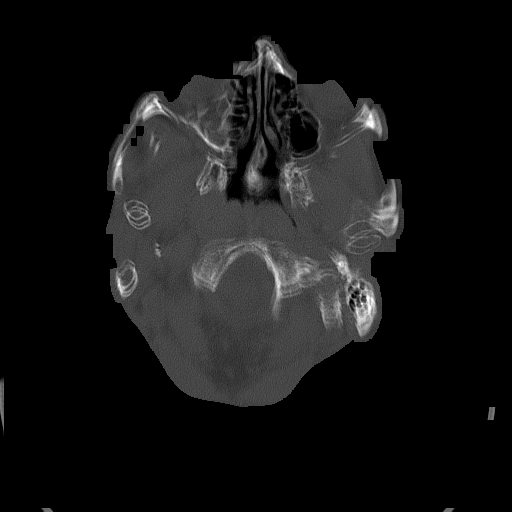
[im 16/80  bone]
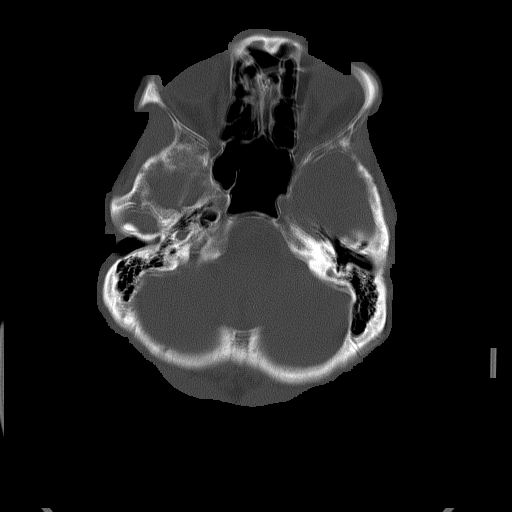
[im 24/80  bone]
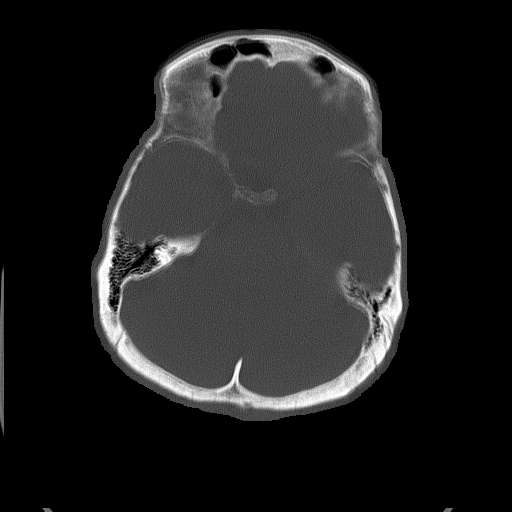
[im 32/80  bone]
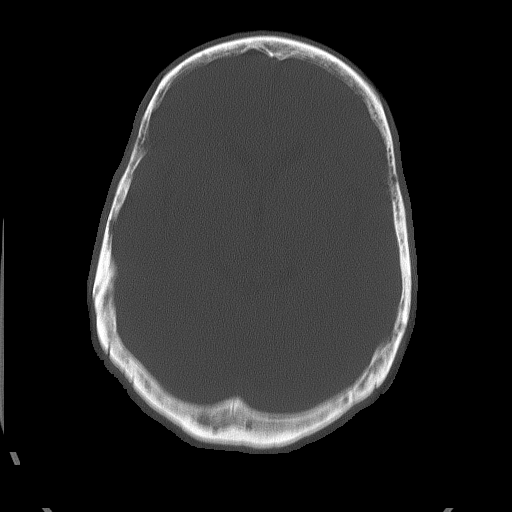
[im 48/80  bone]
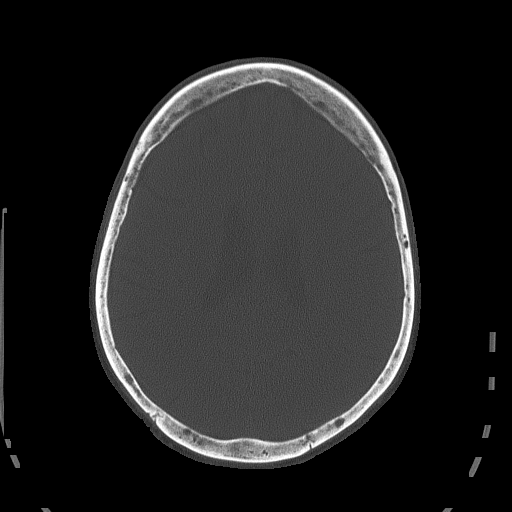
[im 56/80  bone]
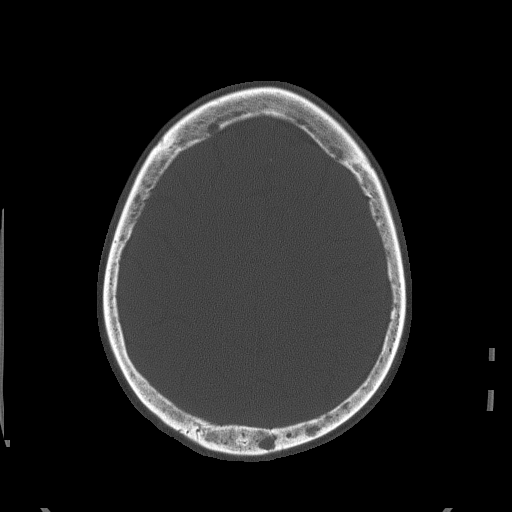

[Series 7: head without · axial · non-contrast · 0.45mm/px · z∈[-111,-61]mm · 2 of 32 slices shown (2 of 2)]
[im 11/32  brain]
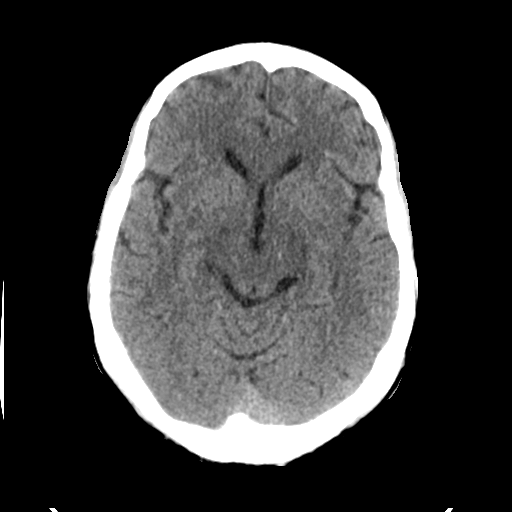
[im 21/32  brain]
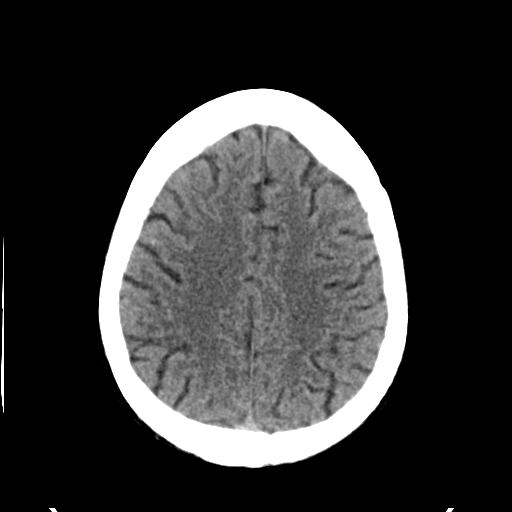

[Series 9: head without cor · coronal · non-contrast · 0.31mm/px · 3 of 67 slices shown]
[im 23/67  brain]
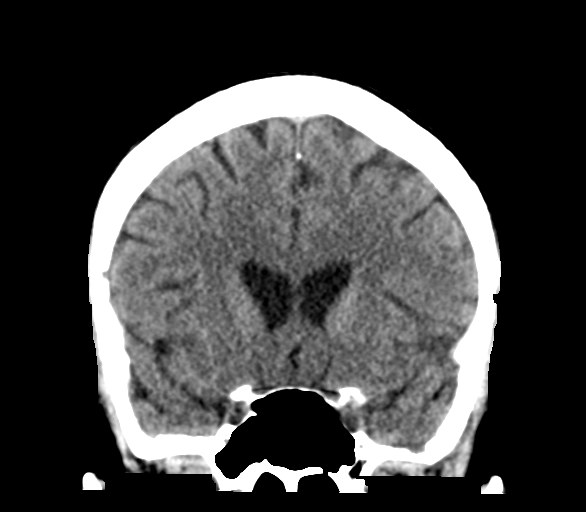
[im 30/67  brain]
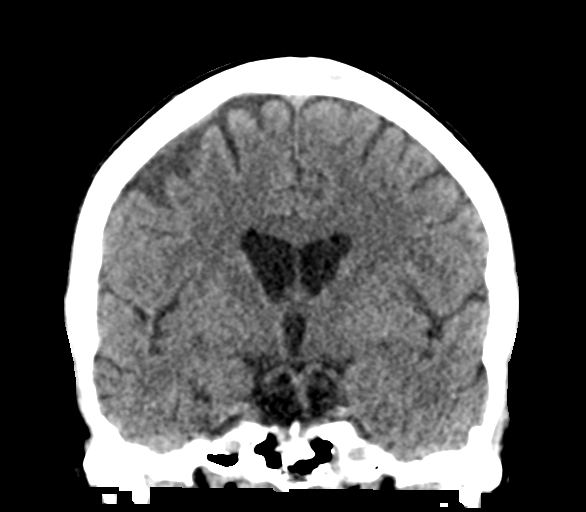
[im 37/67  brain]
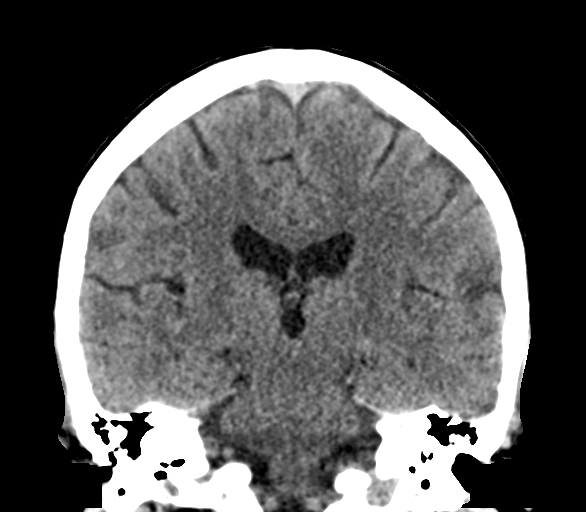

[Series 10: head without sag · sagittal · non-contrast · 0.32mm/px · 3 of 51 slices shown]
[im 17/51  brain]
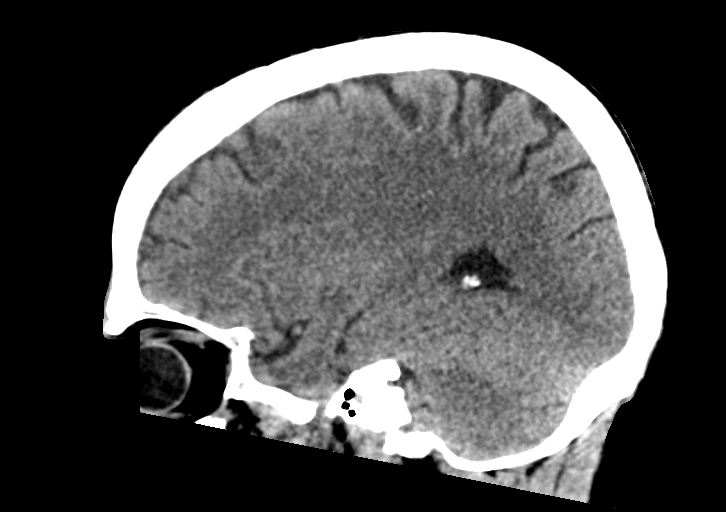
[im 26/51  brain]
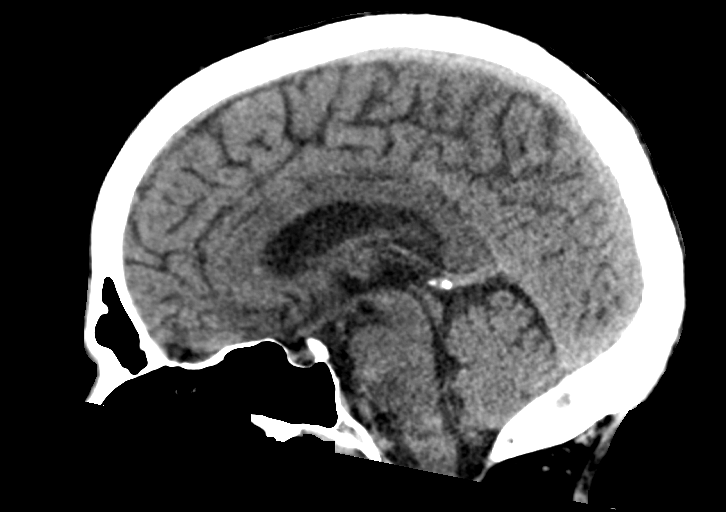
[im 34/51  brain]
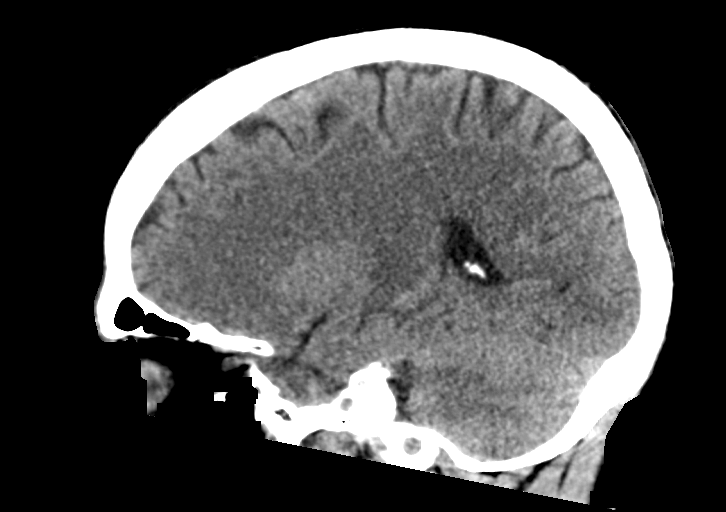

[16 of 47 positions shown; findings below may reference images not displayed]

FINDINGS: Brain: The ventricles and sulci appropriate size for patient's age.
Minimal periventricular and deep white matter chronic microvascular
ischemic changes noted. There is no acute intracranial hemorrhage.
No mass effect or midline shift noted. No extra-axial fluid
collection.

Vascular: There is slight high attenuation of the right MCA in the
sylvian fissure (series 7 image 12). This may be chronic. If there
is clinical concern for right MCA territory ischemia further
evaluation with CT angiography of the head or MRI is recommended.

Skull: There are innumerable lucent calvarial lesions concerning for
multiple myeloma or metastatic disease. Correlation with history of
known malignancy recommended. No acute fracture.

Sinuses/Orbits: There is complete opacification of the visualized
right maxillary sinus. Mild mucoperiosteal thickening of paranasal
sinuses. There is a lytic lesion involving the posterior right
mastoid versus effusion.

Other: None
IMPRESSION: 1. No acute intracranial hemorrhage.
2. Slightly high attenuation of the right MCA in the right sylvian
fissure which may be chronic. Further evaluation with CTA or MRI is
recommended if there is high clinical concern for acute right MCA
territory ischemia.
3. Diffuse osseous and calvarial lytic metastatic disease.
Correlation with history of malignancy recommended.

## 2019-02-07 NOTE — Progress Notes (Signed)
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 His labs today 12/14/2017 show hgb 8.3, platelets 524k. He is doing well overall. He has staples in his right upper leg following a recent surgery and has not been in touch with the surgeons for a f/u of this. He is not receiving physical therapy and is not getting around at home.  He notes that he was not scheduled for a post hospitalization nephrology f/u and has no PCP.  On review of systems, pt reports intermittent fatigue, SOB, pain to the left leg, dizziness, dry mouth and denies fever, chills, night sweats, dysuria and any other accompanying symptoms.   INTERVAL HISTORY:    Andres Fernandez presents today for follow up and continue management of his myeloma/plasma cell leukemia. The patient's last visit with [name] was on 01/20/19. The pt reports that he is doing well overall.   The pt reports that he will be seeing Dr. Norma Fredrickson at [facility] on 02/27/19 and will be pursuing the second of his planned tandem autologous transplant on 03/17/19.   The pt notes that he continues working 5 days a week and has limited his hours somewhat each day but has enjoyed improved energy levels. The pt denies any concerns for infections and has continued with infection prevention strategies and medications.   Lab results today (02/11/19) of CBC w/diff and CMP is as follows: all values are WNL except for RBC at 3.54, HGB at 11.3, HCT at 33.9, CO2 at 21, Creatinine at 2.11, Calcium at 8.4, AST at 11, Total Bilirubin at <0.2, GFR at 38. 02/11/19 LDH at 123  On review of systems, pt reports improved energy levels, eating well, and denies concerns for infections, abdominal pains, leg swelling, and any other symptoms.   REVIEW OF  SYSTEMS:    A 10+ POINT REVIEW OF SYSTEMS WAS OBTAINED including neurology, dermatology, psychiatry, cardiac, respiratory, lymph, extremities, GI, GU, Musculoskeletal, constitutional, breasts, reproductive, HEENT.  All pertinent positives are noted in the HPI.  All others are negative.   MEDICAL HISTORY:  Past Medical History:  Diagnosis Date  . BPH (benign prostatic hyperplasia)   . Hepatitis C 11/27/2017  . Hypertension   . Plasma cell leukemia ([place]) 11/23/2017  . Tobacco abuse     SURGICAL HISTORY: Past Surgical History:  Procedure Laterality Date  . APPENDECTOMY    . FEMUR IM NAIL Right 11/20/2017   Procedure: RIGHT FEMORAL INTRAMEDULLARY (IM) NAIL;  Surgeon: Nicholes Stairs, MD;  Location: [place];  Service: Orthopedics;  Laterality: Right;    SOCIAL HISTORY: Social History   Socioeconomic History  . Marital status: Single    Spouse name: Not on file  . Number of children: Not on file  . Years of education: Not on file  . Highest education level: Not on file  Occupational History  . Not on file  Social Needs  . Financial resource strain: Not on file  . Food insecurity:    Worry: Not on file    Inability: Not on file  . Transportation needs:    Medical: Not on file    Non-medical: Not on file  Tobacco Use  . Smoking status: Current Every Day Smoker    Packs/day: 0.25    Types: Cigarettes  Last attempt to quit: 11/09/2017    Years since quitting: 1.2  . Smokeless tobacco: Never Used  . Tobacco comment: 1 cigarette/day as of 09/25/18  Substance and Sexual Activity  . Alcohol use: Yes  . Drug use: No  . Sexual activity: Not on file  Lifestyle  . Physical activity:    Days per week: Not on file    Minutes per session: Not on file  . Stress: Not on file  Relationships  . Social connections:    Talks on phone: Not on file    Gets together: Not on file    Attends religious service: Not on file    Active member of club or organization: Not on file     Attends meetings of clubs or organizations: Not on file    Relationship status: Not on file  . Intimate partner violence:    Fear of current or ex partner: Not on file    Emotionally abused: Not on file    Physically abused: Not on file    Forced sexual activity: Not on file  Other Topics Concern  . Not on file  Social History Narrative  . Not on file    FAMILY HISTORY: Family History  Problem Relation Age of Onset  . COPD Mother   . Liver cancer Mother     ALLERGIES:  has No Known Allergies.  MEDICATIONS:  . Current Outpatient Medications on File Prior to Visit  Medication Sig Dispense Refill  . acyclovir (ZOVIRAX) 400 MG tablet TAKE 1 TABLET BY MOUTH EVERY DAY 30 tablet 6  . cholecalciferol (VITAMIN D) 400 units TABS tablet Take 400 Units by mouth daily.    . cyclophosphamide (CYTOXAN) 50 MG capsule Take 12 capsules (600 mg total) by mouth once a week. Give on an empty stomach 1 hour before or 2 hours after meals. 36 capsule 1  . dexamethasone (DECADRON) 4 MG tablet Take 5 tablets (20 mg total) by mouth once a week. 20 tablet 1  . nicotine (NICODERM CQ - DOSED IN MG/24 HOURS) 14 mg/24hr patch PLACE 1 PATCH (14 MG TOTAL) ONTO THE SKIN DAILY. 28 patch 1  . ondansetron (ZOFRAN) 4 MG tablet Take 2 tablets (8 mg total) by mouth every 8 (eight) hours as needed for nausea. (Patient not taking: Reported on 09/25/2018) 30 tablet 0   No current facility-administered medications on file prior to visit.     PHYSICAL EXAMINATION:  VS reviewed Vitals:   02/11/19 1247  BP: (!) 151/114  Pulse: 77  Resp: 18  Temp: 97.7 F (36.5 C)  SpO2: 100%     GENERAL:alert, in no acute distress and comfortable SKIN: no acute rashes, no significant lesions EYES: conjunctiva are pink and non-injected, sclera anicteric OROPHARYNX: MMM, no exudates, no oropharyngeal erythema or ulceration NECK: supple, no JVD LYMPH:  no palpable lymphadenopathy in the cervical, axillary or inguinal  regions LUNGS: clear to auscultation b/l with normal respiratory effort HEART: regular rate & rhythm ABDOMEN:  normoactive bowel sounds , non tender, not distended. No palpable hepatosplenomegaly.  Extremity: no pedal edema PSYCH: alert & oriented x 3 with fluent speech NEURO: no focal motor/sensory deficits   LABORATORY DATA:  I have reviewed the data as listed  . CBC Latest Ref Rng & Units 02/11/2019 01/20/2019 10/28/2018  WBC 4.0 - 10.5 K/uL 6.4 8.9 6.4  Hemoglobin 13.0 - 17.0 g/dL 11.3(L) 11.6(L) 11.6(L)  Hematocrit 39.0 - 52.0 % 33.9(L) 34.7(L) 34.7(L)  Platelets 150 - 400 K/uL 212  208 232   . CBC    Component Value Date/Time   WBC 6.4 02/11/2019 1049   RBC 3.54 (L) 02/11/2019 1049   HGB 11.3 (L) 02/11/2019 1049   HGB 11.6 (L) 01/20/2019 1319   HGB 8.3 (L) 12/14/2017 1230   HCT 33.9 (L) 02/11/2019 1049   HCT 23.9 (L) 12/14/2017 1230   PLT 212 02/11/2019 1049   PLT 208 01/20/2019 1319   PLT 524 (H) 12/14/2017 1230   MCV 95.8 02/11/2019 1049   MCV 88.4 12/14/2017 1230   MCH 31.9 02/11/2019 1049   MCHC 33.3 02/11/2019 1049   RDW 15.3 02/11/2019 1049   RDW 15.3 (H) 12/14/2017 1230   LYMPHSABS 2.2 02/11/2019 1049   LYMPHSABS 1.4 12/14/2017 1230   MONOABS 0.7 02/11/2019 1049   MONOABS 0.6 12/14/2017 1230   EOSABS 0.1 02/11/2019 1049   EOSABS 0.1 12/14/2017 1230   BASOSABS 0.1 02/11/2019 1049   BASOSABS 0.1 12/14/2017 1230     . CMP Latest Ref Rng & Units 02/11/2019 01/20/2019 10/28/2018  Glucose 70 - 99 mg/dL 90 87 79  BUN 8 - 23 mg/dL 23 22 27(H)  Creatinine 0.61 - 1.24 mg/dL 2.11(H) 1.97(H) 2.25(H)  Sodium 135 - 145 mmol/L 139 143 142  Potassium 3.5 - 5.1 mmol/L 4.6 4.5 4.4  Chloride 98 - 111 mmol/L 110 111 110  CO2 22 - 32 mmol/L 21(L) 23 24  Calcium 8.9 - 10.3 mg/dL 8.4(L) 9.1 9.0  Total Protein 6.5 - 8.1 g/dL 7.1 7.3 7.4  Total Bilirubin 0.3 - 1.2 mg/dL <0.2(L) 0.4 0.2(L)  Alkaline Phos 38 - 126 U/L 56 60 68  AST 15 - 41 U/L 11(L) 10(L) 13(L)  ALT 0 - 44  U/L 6 7 12     08/07/18 BM Bx:       RADIOGRAPHIC STUDIES: I have personally reviewed the radiological images as listed and agreed with the findings in the report. No results found.  ASSESSMENT & PLAN:   63 y.o. 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 s/p -Hypercalcemia -Renal Failure -Anemia -Extensive Bone lesions -Appears to be primarily Kappa Light chain Myeloma with no overt M spike. Appears to be responding to treatment.- with decreased kappa light chains.   Initial BM Bx showed >95% involvement with kappa restricted serum free light chains. 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 MoL Cy-   08/07/18 BM Bx revealed normocellular bone marrow with trilineage hematopoiesis and 1% plasma cells which indicates the pt is in remission 08/06/18 PET/CT revealed  Expansile lytic lesion involving the left iliac bone, unchanged. Lytic lesion involving the sternum, unchanged. While both are suspicious for myeloma, neither lesion is FDG avid. No hypermetabolic osseous lesions in the visualized axial and appendicular skeleton. Please note that the bilateral lower extremities were not imaged. No suspicious lymphadenopathy. Spleen is normal in size.    Pt began Ninlaro 12m maintenance after pt finished 13 cycles of CyBorD and BM bx showed only 1% plasma cells, then resumed CyBorD and held Ninlaro in anticipation of his tandem bone marrow transplants [facility] in late December 2019 [facility]  11/19/18 High dose chemotherapy with melphalan 1492mm2 and autologous stem cell rescue  PLAN: -Discussed pt labwork today, 02/11/19; blood counts and chemistries are stable. LDH normal at 123. -The pt is continuing to do clinically well and his labs are holding well -The pt will see Dr. RoNorma Fredricksonater this  month and will be tentatively pursuing the second of his tandem autologous  transplants on 03/17/19 He is currently at D+80 post 1st transplant -Continue Acyclovir and other prophylactic antimicrobial as per transplant team. -Continue avoiding crowds, and abiding by infection prevention strategies -Recommended that the pt continue to eat well, drink at least 48-64 oz of water each day, and walk 20-30 minutes each day. -Will see the pt back in 4 months   2) s/p Prophylactic IM nailing for rt femur  completed [facility] at the [facility].  -patient previously reported improvement and is currently progressively back to full time work.  3) Thrombocytopenia resolved   4) Renal insufficiency -- likely primarily related to Myeloma multifactorial MM/TLS/sepsis/antibiotics.Primary light chain nephrology Plan -maintain follow up with nephrology referral to Dr Marval Regal [facility] for continued treatment -Pt completed 8 weeks of maverick for hep c with Dr Linus Salmons on 07/08/18.  -Kidney numbers improved. 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 -Hgb improved to 11.3   -monitor   RTC with Dr Irene Limbo with labs in 51month   The total time spent in the appt was 25 minutes and more than 50% was on counseling and direct patient cares.    GSullivan LoneMD MCharloAAHIVMS SCy [facility] Surgery CenterCWestfield Memorial HospitalHematology/Oncology Physician [facility] (Office):       [phone](Work cell):  [phone](Fax):           [phone] I, SBaldwin Jamaica am acting as a scribe for Dr. GSullivan Lone   .I have reviewed the above documentation for accuracy and completeness, and I agree with the above. .Brunetta GeneraMD

## 2019-02-11 ENCOUNTER — Inpatient Hospital Stay: Payer: BLUE CROSS/BLUE SHIELD | Attending: Hematology

## 2019-02-11 ENCOUNTER — Inpatient Hospital Stay (HOSPITAL_BASED_OUTPATIENT_CLINIC_OR_DEPARTMENT_OTHER): Payer: BLUE CROSS/BLUE SHIELD | Admitting: Hematology

## 2019-02-11 VITALS — BP 140/84 | HR 77 | Temp 97.7°F | Resp 18 | Ht 68.0 in | Wt 172.0 lb

## 2019-02-11 DIAGNOSIS — C9010 Plasma cell leukemia not having achieved remission: Secondary | ICD-10-CM

## 2019-02-11 DIAGNOSIS — I10 Essential (primary) hypertension: Secondary | ICD-10-CM | POA: Diagnosis not present

## 2019-02-11 DIAGNOSIS — B1920 Unspecified viral hepatitis C without hepatic coma: Secondary | ICD-10-CM | POA: Diagnosis not present

## 2019-02-11 DIAGNOSIS — Z9481 Bone marrow transplant status: Secondary | ICD-10-CM | POA: Diagnosis not present

## 2019-02-11 DIAGNOSIS — F17210 Nicotine dependence, cigarettes, uncomplicated: Secondary | ICD-10-CM

## 2019-02-11 DIAGNOSIS — N400 Enlarged prostate without lower urinary tract symptoms: Secondary | ICD-10-CM | POA: Insufficient documentation

## 2019-02-11 DIAGNOSIS — D649 Anemia, unspecified: Secondary | ICD-10-CM | POA: Insufficient documentation

## 2019-02-11 DIAGNOSIS — N189 Chronic kidney disease, unspecified: Secondary | ICD-10-CM | POA: Diagnosis not present

## 2019-02-11 LAB — CMP (CANCER CENTER ONLY)
ALT: 6 U/L (ref 0–44)
AST: 11 U/L — ABNORMAL LOW (ref 15–41)
Albumin: 4 g/dL (ref 3.5–5.0)
Alkaline Phosphatase: 56 U/L (ref 38–126)
Anion gap: 8 (ref 5–15)
BUN: 23 mg/dL (ref 8–23)
CO2: 21 mmol/L — ABNORMAL LOW (ref 22–32)
CREATININE: 2.11 mg/dL — AB (ref 0.61–1.24)
Calcium: 8.4 mg/dL — ABNORMAL LOW (ref 8.9–10.3)
Chloride: 110 mmol/L (ref 98–111)
GFR, Est AFR Am: 38 mL/min — ABNORMAL LOW (ref >60)
GFR, Estimated: 33 mL/min — ABNORMAL LOW (ref >60)
Glucose, Bld: 90 mg/dL (ref 70–99)
Potassium: 4.6 mmol/L (ref 3.5–5.1)
Sodium: 139 mmol/L (ref 135–145)
Total Bilirubin: <0.2 mg/dL — ABNORMAL LOW (ref 0.3–1.2)
Total Protein: 7.1 g/dL (ref 6.5–8.1)

## 2019-02-11 LAB — CBC WITH DIFFERENTIAL/PLATELET
Abs Immature Granulocytes: 0.01 10*3/uL (ref 0.00–0.07)
Basophils Absolute: 0.1 10*3/uL (ref 0.0–0.1)
Basophils Relative: 1 %
EOS PCT: 2 %
Eosinophils Absolute: 0.1 10*3/uL (ref 0.0–0.5)
HCT: 33.9 % — ABNORMAL LOW (ref 39.0–52.0)
Hemoglobin: 11.3 g/dL — ABNORMAL LOW (ref 13.0–17.0)
Immature Granulocytes: 0 %
LYMPHS ABS: 2.2 10*3/uL (ref 0.7–4.0)
Lymphocytes Relative: 35 %
MCH: 31.9 pg (ref 26.0–34.0)
MCHC: 33.3 g/dL (ref 30.0–36.0)
MCV: 95.8 fL (ref 80.0–100.0)
Monocytes Absolute: 0.7 10*3/uL (ref 0.1–1.0)
Monocytes Relative: 11 %
Neutro Abs: 3.3 10*3/uL (ref 1.7–7.7)
Neutrophils Relative %: 51 %
Platelets: 212 10*3/uL (ref 150–400)
RBC: 3.54 MIL/uL — ABNORMAL LOW (ref 4.22–5.81)
RDW: 15.3 % (ref 11.5–15.5)
WBC: 6.4 10*3/uL (ref 4.0–10.5)
nRBC: 0 % (ref 0.0–0.2)

## 2019-02-11 LAB — LACTATE DEHYDROGENASE: LDH: 123 U/L (ref 98–192)

## 2019-02-12 ENCOUNTER — Telehealth: Payer: Self-pay | Admitting: Hematology

## 2019-02-12 NOTE — Telephone Encounter (Signed)
Scheduled appt per 3/4 los.  Printed and mailed calendar.

## 2019-03-26 ENCOUNTER — Telehealth: Payer: Self-pay | Admitting: *Deleted

## 2019-03-26 ENCOUNTER — Telehealth: Payer: Self-pay | Admitting: Hematology

## 2019-03-26 NOTE — Telephone Encounter (Signed)
Scheduled appt per 4/15 sch message - pt is aware of appt date and time   

## 2019-03-26 NOTE — Telephone Encounter (Signed)
Contacted by Elmyra Ricks at [facility]. Patient needs 60 day poist transplant appt with Dr. Irene Limbo for f/u the week of June 8th. Schedule msg sent.

## 2019-04-01 ENCOUNTER — Telehealth: Payer: Self-pay | Admitting: *Deleted

## 2019-04-01 ENCOUNTER — Other Ambulatory Visit: Payer: Self-pay | Admitting: *Deleted

## 2019-04-01 DIAGNOSIS — Z9481 Bone marrow transplant status: Secondary | ICD-10-CM

## 2019-04-01 DIAGNOSIS — C9010 Plasma cell leukemia not having achieved remission: Secondary | ICD-10-CM

## 2019-04-01 NOTE — Telephone Encounter (Signed)
Contacted by Elmyra Ricks [facility]), Nurse Coordinator w/ hematology transplant. She asked that patient have lab work done 4/27 in [place] at [facility] - coordination of care following transplant. Dr. Irene Limbo informed, labs ordered. Schedule msg sent.

## 2019-04-01 NOTE — Telephone Encounter (Signed)
"  Darrold Span, Nurse Coordinator with [facility] [phone]).  Patient of Dr. Grier Mitts, Ian Malkin needs to be set up for a lab appointment Monday, April 07, 2019.  Would like return call."

## 2019-04-02 ENCOUNTER — Telehealth: Payer: Self-pay | Admitting: Hematology

## 2019-04-02 NOTE — Telephone Encounter (Signed)
Spoke with patient re 4/27 lab

## 2019-04-07 ENCOUNTER — Other Ambulatory Visit: Payer: Self-pay

## 2019-04-07 ENCOUNTER — Telehealth: Payer: Self-pay | Admitting: *Deleted

## 2019-04-07 ENCOUNTER — Inpatient Hospital Stay: Payer: BLUE CROSS/BLUE SHIELD | Attending: Hematology

## 2019-04-07 DIAGNOSIS — Z9481 Bone marrow transplant status: Secondary | ICD-10-CM | POA: Diagnosis not present

## 2019-04-07 DIAGNOSIS — C9010 Plasma cell leukemia not having achieved remission: Secondary | ICD-10-CM

## 2019-04-07 LAB — RETICULOCYTES
Immature Retic Fract: 31.9 % — ABNORMAL HIGH (ref 2.3–15.9)
RBC.: 3.17 MIL/uL — ABNORMAL LOW (ref 4.22–5.81)
Retic Count, Absolute: 81.5 10*3/uL (ref 19.0–186.0)
Retic Ct Pct: 2.6 % (ref 0.4–3.1)

## 2019-04-07 LAB — CMP (CANCER CENTER ONLY)
ALT: 10 U/L (ref 0–44)
AST: 16 U/L (ref 15–41)
Albumin: 3.3 g/dL — ABNORMAL LOW (ref 3.5–5.0)
Alkaline Phosphatase: 79 U/L (ref 38–126)
Anion gap: 10 (ref 5–15)
BUN: 16 mg/dL (ref 8–23)
CO2: 24 mmol/L (ref 22–32)
Calcium: 9 mg/dL (ref 8.9–10.3)
Chloride: 109 mmol/L (ref 98–111)
Creatinine: 1.53 mg/dL — ABNORMAL HIGH (ref 0.61–1.24)
GFR, Est AFR Am: 56 mL/min — ABNORMAL LOW (ref >60)
GFR, Estimated: 48 mL/min — ABNORMAL LOW (ref >60)
Glucose, Bld: 91 mg/dL (ref 70–99)
Potassium: 3.9 mmol/L (ref 3.5–5.1)
Sodium: 143 mmol/L (ref 135–145)
Total Bilirubin: <0.2 mg/dL — ABNORMAL LOW (ref 0.3–1.2)
Total Protein: 6.5 g/dL (ref 6.5–8.1)

## 2019-04-07 LAB — CBC WITH DIFFERENTIAL (CANCER CENTER ONLY)
Abs Immature Granulocytes: 0.18 10*3/uL — ABNORMAL HIGH (ref 0.00–0.07)
Basophils Absolute: 0 10*3/uL (ref 0.0–0.1)
Basophils Relative: 1 %
Eosinophils Absolute: 0 10*3/uL (ref 0.0–0.5)
Eosinophils Relative: 0 %
HCT: 29 % — ABNORMAL LOW (ref 39.0–52.0)
Hemoglobin: 10.3 g/dL — ABNORMAL LOW (ref 13.0–17.0)
Immature Granulocytes: 2 %
Lymphocytes Relative: 18 %
Lymphs Abs: 1.4 10*3/uL (ref 0.7–4.0)
MCH: 32.2 pg (ref 26.0–34.0)
MCHC: 35.5 g/dL (ref 30.0–36.0)
MCV: 90.6 fL (ref 80.0–100.0)
Monocytes Absolute: 2.2 10*3/uL — ABNORMAL HIGH (ref 0.1–1.0)
Monocytes Relative: 29 %
Neutro Abs: 3.7 10*3/uL (ref 1.7–7.7)
Neutrophils Relative %: 50 %
Platelet Count: 78 10*3/uL — ABNORMAL LOW (ref 150–400)
RBC: 3.2 MIL/uL — ABNORMAL LOW (ref 4.22–5.81)
RDW: 13.9 % (ref 11.5–15.5)
WBC Count: 7.5 10*3/uL (ref 4.0–10.5)
nRBC: 0 % (ref 0.0–0.2)

## 2019-04-07 LAB — SAMPLE TO BLOOD BANK

## 2019-04-07 LAB — MAGNESIUM: Magnesium: 1.4 mg/dL — CL (ref 1.7–2.4)

## 2019-04-07 NOTE — Telephone Encounter (Signed)
Faxed today's lab results to Elmyra Ricks [facility]), Nurse Coordinator w/ Hematology/Oncology and transplant. She asked that patient have labs drawn here as part of coordination of care. Fax confirmation received.

## 2019-05-21 NOTE — Progress Notes (Signed)
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 His labs today 12/14/2017 show hgb 8.3, platelets 524k. He is doing well overall. He has staples in his right upper leg following a recent surgery and has not been in touch with the surgeons for a f/u of this. He is not receiving physical therapy and is not getting around at home.  He notes that he was not scheduled for a post hospitalization nephrology f/u and has no PCP.  On review of systems, pt reports intermittent fatigue, SOB, pain to the left leg, dizziness, dry mouth and denies fever, chills, night sweats, dysuria and any other accompanying symptoms.   INTERVAL HISTORY:    Andres Fernandez presents today for follow up and continue management of his myeloma/plasma cell leukemia. 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 The patient's last visit with [name] was on 02/11/19. The pt reports that he is doing well overall.  In the interim, the pt had his second of two, planned tandem autologous BM transplants on 03/19/19. Today is D60.  Overall, the pt notes that he is doing quite well and tolerated his most recent transplant as well as can be expected, noting only some non-infectious diarrhea and no other complications.   The pt reports that he is currently eating well and his weight is at his baseline. The pt is continuing on Acyclovir and Bactrim prophylactic as per his transplant team at [facility]. He denies any fevers, chills, cough, skin rashes, diarrhea, or concerns for infections. He has been very compliant with staying away from public spaces and has been compliant with all of his medications.  The pt notes that he will be returning to work on 07/02/19 and will have  scaled down modifications in terms of his responsibilities.   Lab results today (05/22/19) of CBC w/diff and CMP is as follows: all values are stable.  On review of systems, pt reports good energy levels, eating well, stable weight, and denies fevers, chills, cough, skin rashes, diarrhea, concerns for infections, and any other symptoms.   REVIEW OF SYSTEMS:    A 10+ POINT REVIEW OF SYSTEMS WAS OBTAINED including neurology, dermatology, psychiatry, cardiac, respiratory, lymph, extremities, GI, GU, Musculoskeletal, constitutional, breasts, reproductive, HEENT.  All pertinent positives are noted in the HPI.  All others are negative.   MEDICAL HISTORY:  Past Medical History:  Diagnosis Date  . BPH (benign prostatic hyperplasia)   . Hepatitis C 11/27/2017  . Hypertension   . Plasma cell leukemia ([place]) 11/23/2017  . Tobacco abuse     SURGICAL HISTORY: Past Surgical History:  Procedure Laterality Date  . APPENDECTOMY    . FEMUR IM NAIL Right 11/20/2017   Procedure: RIGHT FEMORAL INTRAMEDULLARY (IM) NAIL;  Surgeon: Nicholes Stairs, MD;  Location: [place];  Service: Orthopedics;  Laterality: Right;    SOCIAL HISTORY: Social History   Socioeconomic History  . Marital status: Single    Spouse name: Not on file  . Number of children: Not on file  . Years of education: Not on file  . Highest education level: Not on file  Occupational History  . Not on file  Social Needs  . Financial resource strain: Not on file  . Food insecurity  Worry: Not on file    Inability: Not on file  . Transportation needs    Medical: Not on file    Non-medical: Not on file  Tobacco Use  . Smoking status: Current Every Day Smoker    Packs/day: 0.25    Types: Cigarettes    Last attempt to quit: 11/09/2017    Years since quitting: 1.5  . Smokeless tobacco: Never Used  . Tobacco comment: 1 cigarette/day as of 09/25/18  Substance and Sexual Activity  . Alcohol use: Yes  . Drug use: No  . Sexual  activity: Not on file  Lifestyle  . Physical activity    Days per week: Not on file    Minutes per session: Not on file  . Stress: Not on file  Relationships  . Social Herbalist on phone: Not on file    Gets together: Not on file    Attends religious service: Not on file    Active member of club or organization: Not on file    Attends meetings of clubs or organizations: Not on file    Relationship status: Not on file  . Intimate partner violence    Fear of current or ex partner: Not on file    Emotionally abused: Not on file    Physically abused: Not on file    Forced sexual activity: Not on file  Other Topics Concern  . Not on file  Social History Narrative  . Not on file    FAMILY HISTORY: Family History  Problem Relation Age of Onset  . COPD Mother   . Liver cancer Mother     ALLERGIES:  has No Known Allergies.  MEDICATIONS:  . Current Outpatient Medications on File Prior to Visit  Medication Sig Dispense Refill  . acyclovir (ZOVIRAX) 400 MG tablet TAKE 1 TABLET BY MOUTH EVERY DAY 30 tablet 6  . cholecalciferol (VITAMIN D) 400 units TABS tablet Take 400 Units by mouth daily.    . cyclophosphamide (CYTOXAN) 50 MG capsule Take 12 capsules (600 mg total) by mouth once a week. Give on an empty stomach 1 hour before or 2 hours after meals. 36 capsule 1  . dexamethasone (DECADRON) 4 MG tablet Take 5 tablets (20 mg total) by mouth once a week. 20 tablet 1  . nicotine (NICODERM CQ - DOSED IN MG/24 HOURS) 14 mg/24hr patch PLACE 1 PATCH (14 MG TOTAL) ONTO THE SKIN DAILY. 28 patch 1  . ondansetron (ZOFRAN) 4 MG tablet Take 2 tablets (8 mg total) by mouth every 8 (eight) hours as needed for nausea. (Patient not taking: Reported on 09/25/2018) 30 tablet 0   No current facility-administered medications on file prior to visit.     PHYSICAL EXAMINATION:  VS reviewed Vitals:   05/22/19 1037  BP: 107/82  Pulse: 87  Resp: 18  Temp: 98.7 F (37.1 C)  SpO2: 100%      GENERAL:alert, in no acute distress and comfortable SKIN: no acute rashes, no significant lesions EYES: conjunctiva are pink and non-injected, sclera anicteric OROPHARYNX: MMM, no exudates, no oropharyngeal erythema or ulceration NECK: supple, no JVD LYMPH:  no palpable lymphadenopathy in the cervical, axillary or inguinal regions LUNGS: clear to auscultation b/l with normal respiratory effort HEART: regular rate & rhythm ABDOMEN:  normoactive bowel sounds , non tender, not distended. No palpable hepatosplenomegaly.  Extremity: no pedal edema PSYCH: alert & oriented x 3 with fluent speech NEURO: no focal motor/sensory deficits   LABORATORY DATA:  I have reviewed the data as listed  . CBC Latest Ref Rng & Units 05/22/2019 04/07/2019 02/11/2019  WBC 4.0 - 10.5 K/uL 7.8 7.5 6.4  Hemoglobin 13.0 - 17.0 g/dL 12.2(L) 10.3(L) 11.3(L)  Hematocrit 39.0 - 52.0 % 36.9(L) 29.0(L) 33.9(L)  Platelets 150 - 400 K/uL 192 78(L) 212   . CBC    Component Value Date/Time   WBC 7.8 05/22/2019 1123   RBC 3.78 (L) 05/22/2019 1123   HGB 12.2 (L) 05/22/2019 1123   HGB 10.3 (L) 04/07/2019 1101   HGB 8.3 (L) 12/14/2017 1230   HCT 36.9 (L) 05/22/2019 1123   HCT 23.9 (L) 12/14/2017 1230   PLT 192 05/22/2019 1123   PLT 78 (L) 04/07/2019 1101   PLT 524 (H) 12/14/2017 1230   MCV 97.6 05/22/2019 1123   MCV 88.4 12/14/2017 1230   MCH 32.3 05/22/2019 1123   MCHC 33.1 05/22/2019 1123   RDW 15.0 05/22/2019 1123   RDW 15.3 (H) 12/14/2017 1230   LYMPHSABS 2.0 05/22/2019 1123   LYMPHSABS 1.4 12/14/2017 1230   MONOABS 0.8 05/22/2019 1123   MONOABS 0.6 12/14/2017 1230   EOSABS 0.2 05/22/2019 1123   EOSABS 0.1 12/14/2017 1230   BASOSABS 0.1 05/22/2019 1123   BASOSABS 0.1 12/14/2017 1230     . CMP Latest Ref Rng & Units 05/22/2019 04/07/2019 02/11/2019  Glucose 70 - 99 mg/dL 82 91 90  BUN 8 - 23 mg/dL 26(H) 16 23  Creatinine 0.61 - 1.24 mg/dL 1.94(H) 1.53(H) 2.11(H)  Sodium 135 - 145 mmol/L 140 143 139   Potassium 3.5 - 5.1 mmol/L 4.5 3.9 4.6  Chloride 98 - 111 mmol/L 108 109 110  CO2 22 - 32 mmol/L 21(L) 24 21(L)  Calcium 8.9 - 10.3 mg/dL 9.3 9.0 8.4(L)  Total Protein 6.5 - 8.1 g/dL 7.5 6.5 7.1  Total Bilirubin 0.3 - 1.2 mg/dL 0.3 <0.2(L) <0.2(L)  Alkaline Phos 38 - 126 U/L 68 79 56  AST 15 - 41 U/L 10(L) 16 11(L)  ALT 0 - 44 U/L _0 08/07/18 BM Bx:       RADIOGRAPHIC STUDIES: I have personally reviewed the radiological images as listed and agreed with the findings in the report. No results found.  ASSESSMENT & PLAN:   63 y.o. 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 s/p -Hypercalcemia -Renal Failure -Anemia -Extensive Bone lesions -Appears to be primarily Kappa Light chain Myeloma with no overt M spike. Appears to be responding to treatment.- with decreased kappa light chains.   Initial BM Bx showed >95% involvement with kappa restricted serum free light chains. 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 MoL Cy-   08/07/18 BM Bx revealed normocellular bone marrow with trilineage hematopoiesis and 1% plasma cells which indicates the pt is in remission 08/06/18 PET/CT revealed  Expansile lytic lesion involving the left iliac bone, unchanged. Lytic lesion involving the sternum, unchanged. While both are suspicious for myeloma, neither lesion is FDG avid. No hypermetabolic osseous lesions in the visualized axial and appendicular skeleton. Please note that the bilateral lower extremities were not imaged. No suspicious lymphadenopathy. Spleen is normal in size.    Pt began Ninlaro 52m maintenance after pt finished 13 cycles of CyBorD and BM bx showed only 1% plasma cells, then resumed CyBorD and held Ninlaro in anticipation of his tandem bone marrow [facility] late December 2019 [facility]  11/19/18 High dose chemotherapy with Melphalan  169m/m2 and autologous stem cell rescue  03/20/19 High dose chemotherapy with Melphalan 2046mm2 and autologous stem cell rescue. Non-infectious diarrhea, uncomplicated course otherwise.  PLAN: -Discussed pt labwork today, 05/22/19; labs stable. -Pt is doing very well overall after his most recent BM transplant -[facility] of [facility] therapies -[facility] -Will plan to start Pamidronate every 8 weeks -Continue Acyclovir and other prophylactic antimicrobial as per transplant team. - he has been [facility] PET[facility]CT [facility]. -Continue avoiding crowds, and abiding by infection prevention strategies -Recommended that the pt continue to eat well, drink at least 48-64 oz of water each day, and walk 20-30 minutes each day.  -Will see the pt back in 8 weeks  2) s/p Prophylactic IM nailing for rt femur  [facility] previously reported improvement and is currently progressively back to full time work.  3) Thrombocytopenia resolved   4) Renal insufficiency -- likely primarily related to Myeloma multifactorial MM/TLS/sepsis/antibiotics.Primary light chain nephrology Plan -maintain follow up with nephrology referral to Dr CoMarval RegalCNarda Amberidney for continued treatment -Pt completed 8 weeks of maverick for hep c with Dr CoLinus Salmonsn 07/08/18.  -Kidney numbers improved. 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 -Hgb improved to 11.3   -monitor    Pamidronate starting in 1 week (every 8 weeks) Please move lab and clinic  appointment on 7/6 to the (2nd)next Dose of Pamidronate in 8 weeks.   The total time spent in the appt was 25 minutes and more than 50% was on counseling and direct patient cares.   GaSullivan LoneD MSLa Paloma AdditionAHIVMS SCNorth Alabama Specialty HospitalTNovant Health Hunters[facility] Outpatient Surgery Centerematology/Oncology Physician [facility](Office):       [phone] cell):  [phone]):           [phone], ScBaldwin Jamaicaam acting as a scribe for Dr. GaSullivan Lone  .I have reviewed the above documentation for accuracy and completeness, and I agree with the above. .GBrunetta GeneraD

## 2019-05-22 ENCOUNTER — Other Ambulatory Visit: Payer: Self-pay

## 2019-05-22 ENCOUNTER — Inpatient Hospital Stay: Payer: BC Managed Care – PPO

## 2019-05-22 ENCOUNTER — Inpatient Hospital Stay: Payer: BC Managed Care – PPO | Attending: Hematology | Admitting: Hematology

## 2019-05-22 VITALS — BP 107/82 | HR 87 | Temp 98.7°F | Resp 18 | Ht 68.0 in | Wt 159.2 lb

## 2019-05-22 DIAGNOSIS — Z9484 Stem cells transplant status: Secondary | ICD-10-CM | POA: Diagnosis not present

## 2019-05-22 DIAGNOSIS — D649 Anemia, unspecified: Secondary | ICD-10-CM

## 2019-05-22 DIAGNOSIS — F17210 Nicotine dependence, cigarettes, uncomplicated: Secondary | ICD-10-CM | POA: Diagnosis not present

## 2019-05-22 DIAGNOSIS — N19 Unspecified kidney failure: Secondary | ICD-10-CM | POA: Diagnosis not present

## 2019-05-22 DIAGNOSIS — C9010 Plasma cell leukemia not having achieved remission: Secondary | ICD-10-CM | POA: Diagnosis not present

## 2019-05-22 LAB — CMP (CANCER CENTER ONLY)
ALT: 8 U/L (ref 0–44)
AST: 10 U/L — ABNORMAL LOW (ref 15–41)
Albumin: 4.3 g/dL (ref 3.5–5.0)
Alkaline Phosphatase: 68 U/L (ref 38–126)
Anion gap: 11 (ref 5–15)
BUN: 26 mg/dL — ABNORMAL HIGH (ref 8–23)
CO2: 21 mmol/L — ABNORMAL LOW (ref 22–32)
Calcium: 9.3 mg/dL (ref 8.9–10.3)
Chloride: 108 mmol/L (ref 98–111)
Creatinine: 1.94 mg/dL — ABNORMAL HIGH (ref 0.61–1.24)
GFR, Est AFR Am: 42 mL/min — ABNORMAL LOW (ref >60)
GFR, Estimated: 36 mL/min — ABNORMAL LOW (ref >60)
Glucose, Bld: 82 mg/dL (ref 70–99)
Potassium: 4.5 mmol/L (ref 3.5–5.1)
Sodium: 140 mmol/L (ref 135–145)
Total Bilirubin: 0.3 mg/dL (ref 0.3–1.2)
Total Protein: 7.5 g/dL (ref 6.5–8.1)

## 2019-05-22 LAB — CBC WITH DIFFERENTIAL/PLATELET
Abs Immature Granulocytes: 0.02 10*3/uL (ref 0.00–0.07)
Basophils Absolute: 0.1 10*3/uL (ref 0.0–0.1)
Basophils Relative: 1 %
Eosinophils Absolute: 0.2 10*3/uL (ref 0.0–0.5)
Eosinophils Relative: 2 %
HCT: 36.9 % — ABNORMAL LOW (ref 39.0–52.0)
Hemoglobin: 12.2 g/dL — ABNORMAL LOW (ref 13.0–17.0)
Immature Granulocytes: 0 %
Lymphocytes Relative: 25 %
Lymphs Abs: 2 10*3/uL (ref 0.7–4.0)
MCH: 32.3 pg (ref 26.0–34.0)
MCHC: 33.1 g/dL (ref 30.0–36.0)
MCV: 97.6 fL (ref 80.0–100.0)
Monocytes Absolute: 0.8 10*3/uL (ref 0.1–1.0)
Monocytes Relative: 10 %
Neutro Abs: 4.8 10*3/uL (ref 1.7–7.7)
Neutrophils Relative %: 62 %
Platelets: 192 10*3/uL (ref 150–400)
RBC: 3.78 MIL/uL — ABNORMAL LOW (ref 4.22–5.81)
RDW: 15 % (ref 11.5–15.5)
WBC: 7.8 10*3/uL (ref 4.0–10.5)
nRBC: 0 % (ref 0.0–0.2)

## 2019-05-22 LAB — PHOSPHORUS: Phosphorus: 4.5 mg/dL (ref 2.5–4.6)

## 2019-05-23 LAB — VITAMIN D 25 HYDROXY (VIT D DEFICIENCY, FRACTURES): Vit D, 25-Hydroxy: 32 ng/mL (ref 30.0–100.0)

## 2019-05-26 ENCOUNTER — Telehealth: Payer: Self-pay | Admitting: Hematology

## 2019-05-26 NOTE — Telephone Encounter (Signed)
Scheduled appt per 6/11 los. Left a voice message of appt date and time.   Mailed calendar.

## 2019-05-30 ENCOUNTER — Inpatient Hospital Stay: Payer: BC Managed Care – PPO

## 2019-05-30 ENCOUNTER — Other Ambulatory Visit: Payer: Self-pay

## 2019-05-30 VITALS — BP 129/89 | HR 86 | Temp 97.8°F | Resp 16

## 2019-05-30 DIAGNOSIS — M84551A Pathological fracture in neoplastic disease, right femur, initial encounter for fracture: Secondary | ICD-10-CM

## 2019-05-30 DIAGNOSIS — C9010 Plasma cell leukemia not having achieved remission: Secondary | ICD-10-CM | POA: Diagnosis not present

## 2019-05-30 DIAGNOSIS — Z7189 Other specified counseling: Secondary | ICD-10-CM

## 2019-05-30 MED ORDER — SODIUM CHLORIDE 0.9 % IV SOLN
Freq: Once | INTRAVENOUS | Status: AC
  Administered 2019-05-30: 13:00:00 via INTRAVENOUS
  Filled 2019-05-30: qty 250

## 2019-05-30 MED ORDER — SODIUM CHLORIDE 0.9 % IV SOLN
60.0000 mg | Freq: Once | INTRAVENOUS | Status: DC
  Filled 2019-05-30: qty 10

## 2019-05-30 MED ORDER — SODIUM CHLORIDE 0.9 % IV SOLN
60.0000 mg | Freq: Once | INTRAVENOUS | Status: AC
  Administered 2019-05-30: 13:00:00 60 mg via INTRAVENOUS
  Filled 2019-05-30: qty 20

## 2019-05-30 NOTE — Patient Instructions (Signed)
[facility] Discharge Instructions   Today you received Pamidronate To help prevent nausea and vomiting after your treatment, we encourage you to take your nausea medication as directed.    If you develop nausea and vomiting that is not controlled by your nausea medication, call the clinic.   BELOW ARE SYMPTOMS THAT SHOULD BE REPORTED IMMEDIATELY:  *FEVER GREATER THAN 100.5 F  *CHILLS WITH OR WITHOUT FEVER  NAUSEA AND VOMITING THAT IS NOT CONTROLLED WITH YOUR NAUSEA MEDICATION  *UNUSUAL SHORTNESS OF BREATH  *UNUSUAL BRUISING OR BLEEDING  TENDERNESS IN MOUTH AND THROAT WITH OR WITHOUT PRESENCE OF ULCERS  *URINARY PROBLEMS  *BOWEL PROBLEMS  UNUSUAL RASH Items with * indicate a potential emergency and should be followed up as soon as possible.  Feel free to call the clinic should you have any questions or concerns. The clinic phone number is [phone]  Pamidronate injection What is this medicine? PAMIDRONATE (pa mi DROE nate) slows calcium loss from bones. It is used to treat high calcium blood levels from cancer or Paget's disease. It is also used to treat bone pain and prevent fractures from certain cancers that have spread to the bone. This medicine may be used for other purposes; ask your health care provider or pharmacist if you have questions. COMMON BRAND NAME(S): Aredia What should I tell my health care provider before I take this medicine? They need to know if you have any of these conditions: -aspirin-sensitive asthma -dental disease -kidney disease -an unusual or allergic reaction to pamidronate, other medicines, foods, dyes, or preservatives -pregnant or trying to get pregnant -breast-feeding How should I use this medicine? This medicine is for infusion into a vein. It is given by a health care professional in a hospital or clinic setting. Talk to your pediatrician regarding the use of this medicine in children. This medicine is not approved  for use in children. Overdosage: If you think you have taken too much of this medicine contact a poison control center or emergency room at once. NOTE: This medicine is only for you. Do not share this medicine with others. What if I miss a dose? This does not apply. What may interact with this medicine? -certain antibiotics given by injection -medicines for inflammation or pain like ibuprofen, naproxen -some diuretics like bumetanide, furosemide -cyclosporine -parathyroid hormone -tacrolimus -teriparatide -thalidomide This list may not describe all possible interactions. Give your health care provider a list of all the medicines, herbs, non-prescription drugs, or dietary supplements you use. Also tell them if you smoke, drink alcohol, or use illegal drugs. Some items may interact with your medicine. What should I watch for while using this medicine? Visit your doctor or health care professional for regular checkups. It may be some time before you see the benefit from this medicine. Do not stop taking your medicine unless your doctor tells you to. Your doctor may order blood tests or other tests to see how you are doing. Women should inform their doctor if they wish to become pregnant or think they might be pregnant. There is a potential for serious side effects to an unborn child. Talk to your health care professional or pharmacist for more information. You should make sure that you get enough calcium and vitamin D while you are taking this medicine. Discuss the foods you eat and the vitamins you take with your health care professional. Some people who take this medicine have severe bone, joint, and/or muscle pain. This medicine may also increase your  risk for a broken thigh bone. Tell your doctor right away if you have pain in your upper leg or groin. Tell your doctor if you have any pain that does not go away or that gets worse. What side effects may I notice from receiving this medicine? Side  effects that you should report to your doctor or health care professional as soon as possible: -allergic reactions like skin rash, itching or hives, swelling of the face, lips, or tongue -black or tarry stools -changes in vision -eye inflammation, pain -high blood pressure -jaw pain, especially burning or cramping -muscle weakness -numb, tingling pain -swelling of feet or hands -trouble passing urine or change in the amount of urine -unable to move easily Side effects that usually do not require medical attention (report to your doctor or health care professional if they continue or are bothersome): -bone, joint, or muscle pain -constipation -dizzy, drowsy -fever -headache -loss of appetite -nausea, vomiting -pain at site where injected This list may not describe all possible side effects. Call your doctor for medical advice about side effects. You may report side effects to FDA at 1-800-FDA-1088. Where should I keep my medicine? This drug is given in a hospital or clinic and will not be stored at home. NOTE: This sheet is a summary. It may not cover all possible information. If you have questions about this medicine, talk to your doctor, pharmacist, or health care provider.  2019 Elsevier/Gold Standard (2011-05-26 08:49:49)

## 2019-06-16 ENCOUNTER — Other Ambulatory Visit: Payer: BLUE CROSS/BLUE SHIELD

## 2019-06-16 ENCOUNTER — Ambulatory Visit: Payer: BLUE CROSS/BLUE SHIELD | Admitting: Hematology

## 2019-07-17 ENCOUNTER — Telehealth: Payer: Self-pay | Admitting: Hematology

## 2019-07-17 NOTE — Telephone Encounter (Signed)
Called patient regarding rescheduled appointments on 08/14, left patient a voicemail and a calender will be mailed.

## 2019-07-21 ENCOUNTER — Telehealth: Payer: Self-pay | Admitting: *Deleted

## 2019-07-21 NOTE — Telephone Encounter (Signed)
Patient called with c/o pain in Rt hip/femur (s/p prophylactic nailing). States increasingly painful to sit, but able to stand/lie down without increase in pain. Taking tylenol occaisionally. Asking if there's anything else he can do or take for it. Dr. Irene Limbo given message. Patient verified date/times of upcoming appts (8/19 for lab/MD/infusion)

## 2019-07-23 NOTE — Telephone Encounter (Signed)
Contacted patient per Dr. Grier Mitts instructions: Advise patient to contact orthopedist who pinned his hip (Dr. Victorino December w/ [facility], [phone], procedure 11/20/2017) for evaluation. Patient given this information and verbalized understanding.

## 2019-07-25 ENCOUNTER — Ambulatory Visit: Payer: BC Managed Care – PPO | Admitting: Hematology

## 2019-07-25 ENCOUNTER — Ambulatory Visit: Payer: BC Managed Care – PPO

## 2019-07-25 ENCOUNTER — Other Ambulatory Visit: Payer: BC Managed Care – PPO

## 2019-07-30 ENCOUNTER — Inpatient Hospital Stay: Payer: BC Managed Care – PPO

## 2019-07-30 ENCOUNTER — Inpatient Hospital Stay: Payer: BC Managed Care – PPO | Attending: Hematology

## 2019-07-30 ENCOUNTER — Inpatient Hospital Stay (HOSPITAL_BASED_OUTPATIENT_CLINIC_OR_DEPARTMENT_OTHER): Payer: BC Managed Care – PPO | Admitting: Hematology

## 2019-07-30 ENCOUNTER — Other Ambulatory Visit: Payer: Self-pay

## 2019-07-30 VITALS — BP 103/77 | HR 85 | Temp 97.7°F | Resp 18 | Ht 68.0 in | Wt 161.4 lb

## 2019-07-30 DIAGNOSIS — D638 Anemia in other chronic diseases classified elsewhere: Secondary | ICD-10-CM | POA: Diagnosis not present

## 2019-07-30 DIAGNOSIS — I10 Essential (primary) hypertension: Secondary | ICD-10-CM | POA: Insufficient documentation

## 2019-07-30 DIAGNOSIS — C9010 Plasma cell leukemia not having achieved remission: Secondary | ICD-10-CM

## 2019-07-30 DIAGNOSIS — Z9481 Bone marrow transplant status: Secondary | ICD-10-CM

## 2019-07-30 DIAGNOSIS — F17210 Nicotine dependence, cigarettes, uncomplicated: Secondary | ICD-10-CM | POA: Diagnosis not present

## 2019-07-30 DIAGNOSIS — B1920 Unspecified viral hepatitis C without hepatic coma: Secondary | ICD-10-CM | POA: Diagnosis not present

## 2019-07-30 DIAGNOSIS — Z7189 Other specified counseling: Secondary | ICD-10-CM

## 2019-07-30 DIAGNOSIS — Z9889 Other specified postprocedural states: Secondary | ICD-10-CM | POA: Diagnosis not present

## 2019-07-30 DIAGNOSIS — M84551A Pathological fracture in neoplastic disease, right femur, initial encounter for fracture: Secondary | ICD-10-CM

## 2019-07-30 DIAGNOSIS — N289 Disorder of kidney and ureter, unspecified: Secondary | ICD-10-CM | POA: Diagnosis not present

## 2019-07-30 DIAGNOSIS — M25551 Pain in right hip: Secondary | ICD-10-CM | POA: Diagnosis not present

## 2019-07-30 DIAGNOSIS — R682 Dry mouth, unspecified: Secondary | ICD-10-CM | POA: Diagnosis not present

## 2019-07-30 DIAGNOSIS — R5383 Other fatigue: Secondary | ICD-10-CM | POA: Diagnosis not present

## 2019-07-30 DIAGNOSIS — M79605 Pain in left leg: Secondary | ICD-10-CM | POA: Diagnosis not present

## 2019-07-30 DIAGNOSIS — R42 Dizziness and giddiness: Secondary | ICD-10-CM | POA: Insufficient documentation

## 2019-07-30 DIAGNOSIS — Z79899 Other long term (current) drug therapy: Secondary | ICD-10-CM | POA: Insufficient documentation

## 2019-07-30 DIAGNOSIS — R0602 Shortness of breath: Secondary | ICD-10-CM | POA: Insufficient documentation

## 2019-07-30 DIAGNOSIS — N400 Enlarged prostate without lower urinary tract symptoms: Secondary | ICD-10-CM | POA: Insufficient documentation

## 2019-07-30 LAB — CMP (CANCER CENTER ONLY)
ALT: 15 U/L (ref 0–44)
AST: 14 U/L — ABNORMAL LOW (ref 15–41)
Albumin: 4.3 g/dL (ref 3.5–5.0)
Alkaline Phosphatase: 68 U/L (ref 38–126)
Anion gap: 12 (ref 5–15)
BUN: 27 mg/dL — ABNORMAL HIGH (ref 8–23)
CO2: 20 mmol/L — ABNORMAL LOW (ref 22–32)
Calcium: 9.1 mg/dL (ref 8.9–10.3)
Chloride: 110 mmol/L (ref 98–111)
Creatinine: 1.72 mg/dL — ABNORMAL HIGH (ref 0.61–1.24)
GFR, Est AFR Am: 48 mL/min — ABNORMAL LOW (ref >60)
GFR, Estimated: 42 mL/min — ABNORMAL LOW (ref >60)
Glucose, Bld: 80 mg/dL (ref 70–99)
Potassium: 4.1 mmol/L (ref 3.5–5.1)
Sodium: 142 mmol/L (ref 135–145)
Total Bilirubin: 0.3 mg/dL (ref 0.3–1.2)
Total Protein: 7.3 g/dL (ref 6.5–8.1)

## 2019-07-30 LAB — CBC WITH DIFFERENTIAL/PLATELET
Abs Immature Granulocytes: 0.03 10*3/uL (ref 0.00–0.07)
Basophils Absolute: 0.1 10*3/uL (ref 0.0–0.1)
Basophils Relative: 1 %
Eosinophils Absolute: 0.1 10*3/uL (ref 0.0–0.5)
Eosinophils Relative: 1 %
HCT: 38.7 % — ABNORMAL LOW (ref 39.0–52.0)
Hemoglobin: 13.2 g/dL (ref 13.0–17.0)
Immature Granulocytes: 0 %
Lymphocytes Relative: 17 %
Lymphs Abs: 1.6 10*3/uL (ref 0.7–4.0)
MCH: 31.7 pg (ref 26.0–34.0)
MCHC: 34.1 g/dL (ref 30.0–36.0)
MCV: 93 fL (ref 80.0–100.0)
Monocytes Absolute: 0.7 10*3/uL (ref 0.1–1.0)
Monocytes Relative: 8 %
Neutro Abs: 6.7 10*3/uL (ref 1.7–7.7)
Neutrophils Relative %: 73 %
Platelets: 229 10*3/uL (ref 150–400)
RBC: 4.16 MIL/uL — ABNORMAL LOW (ref 4.22–5.81)
RDW: 13.1 % (ref 11.5–15.5)
WBC: 9.3 10*3/uL (ref 4.0–10.5)
nRBC: 0 % (ref 0.0–0.2)

## 2019-07-30 LAB — SAMPLE TO BLOOD BANK

## 2019-07-30 MED ORDER — SODIUM CHLORIDE 0.9 % IV SOLN
60.0000 mg | Freq: Once | INTRAVENOUS | Status: DC
  Filled 2019-07-30: qty 20

## 2019-07-30 MED ORDER — SODIUM CHLORIDE 0.9 % IV SOLN
60.0000 mg | Freq: Once | INTRAVENOUS | Status: AC
  Administered 2019-07-30: 60 mg via INTRAVENOUS
  Filled 2019-07-30: qty 10

## 2019-07-30 MED ORDER — SODIUM CHLORIDE 0.9 % IV SOLN
Freq: Once | INTRAVENOUS | Status: AC
  Administered 2019-07-30: 11:00:00 via INTRAVENOUS
  Filled 2019-07-30: qty 250

## 2019-07-30 NOTE — Patient Instructions (Signed)
Coronavirus (COVID-19) Are you at risk?  Are you at risk for the Coronavirus (COVID-19)?  To be considered HIGH RISK for Coronavirus (COVID-19), you have to meet the following criteria:  . Traveled to [place], [place], [name], [place] or [place]; or in the [place] to [place], [place], [place], or [place]; and have fever, cough, and shortness of breath within the last 2 weeks of travel OR . Been in close contact with a person diagnosed with COVID-19 within the last 2 weeks and have fever, cough, and shortness of breath . IF YOU DO NOT MEET THESE CRITERIA, YOU ARE CONSIDERED LOW RISK FOR COVID-19.  What to do if you are HIGH RISK for COVID-19?  . If you are having a medical emergency, call 911. . Seek medical care right away. Before you go to a doctor's office, urgent care or emergency department, call ahead and tell them about your recent travel, contact with someone diagnosed with COVID-19, and your symptoms. You should receive instructions from your physician's office regarding next steps of care.  . When you arrive at healthcare provider, tell the healthcare staff immediately you have returned from visiting [place], [place], [place], [place] or [name]; or traveled in the [place] to [place], [place], [place], or [place]; in the last two weeks or you have been in close contact with a person diagnosed with COVID-19 in the last 2 weeks.   . Tell the health care staff about your symptoms: fever, cough and shortness of breath. . After you have been seen by a medical provider, you will be either: o Tested for (COVID-19) and discharged home on quarantine except to seek medical care if symptoms worsen, and asked to  - Stay home and avoid contact with others until you get your results (4-5 days)  - Avoid travel on public transportation if possible (such as bus, train, or airplane) or o Sent to the Emergency Department by EMS for evaluation, COVID-19 testing, and possible  admission depending on your condition and test results.  What to do if you are LOW RISK for COVID-19?  Reduce your risk of any infection by using the same precautions used for avoiding the common cold or flu:  . Wash your hands often with soap and warm water for at least 20 seconds.  If soap and water are not readily available, use an alcohol-based hand sanitizer with at least 60% alcohol.  . If coughing or sneezing, cover your mouth and nose by coughing or sneezing into the elbow areas of your shirt or coat, into a tissue or into your sleeve (not your hands). . Avoid shaking hands with others and consider head nods or verbal greetings only. . Avoid touching your eyes, nose, or mouth with unwashed hands.  . Avoid close contact with people who are sick. . Avoid places or events with large numbers of people in one location, like concerts or sporting events. . Carefully consider travel plans you have or are making. . If you are planning any travel outside or inside the [place], visit the CDC's Travelers' Health webpage for the latest health notices. . If you have some symptoms but not all symptoms, continue to monitor at home and seek medical attention if your symptoms worsen. . If you are having a medical emergency, call 911.   ADDITIONAL HEALTHCARE OPTIONS FOR PATIENTS  [facility] / e-Visit: [email or web address]         [facility]: [phone]  [place]   Urgent Care: [facility]: [phone]   Pamidronate injection What is this medicine? PAMIDRONATE (pa mi DROE nate) slows calcium loss from bones. It is used to treat high calcium blood levels from cancer or Paget's disease. It is also used to treat bone pain and prevent fractures from certain cancers that have spread to the bone. This medicine may be used for other purposes; ask your health care provider or pharmacist if you  have questions. COMMON BRAND NAME(S): Aredia What should I tell my health care provider before I take this medicine? They need to know if you have any of these conditions:  aspirin-sensitive asthma  dental disease  kidney disease  an unusual or allergic reaction to pamidronate, other medicines, foods, dyes, or preservatives  pregnant or trying to get pregnant  breast-feeding How should I use this medicine? This medicine is for infusion into a vein. It is given by a health care professional in a hospital or clinic setting. Talk to your pediatrician regarding the use of this medicine in children. This medicine is not approved for use in children. Overdosage: If you think you have taken too much of this medicine contact a poison control center or emergency room at once. NOTE: This medicine is only for you. Do not share this medicine with others. What if I miss a dose? This does not apply. What may interact with this medicine?  certain antibiotics given by injection  medicines for inflammation or pain like ibuprofen, naproxen  some diuretics like bumetanide, furosemide  cyclosporine  parathyroid hormone  tacrolimus  teriparatide  thalidomide This list may not describe all possible interactions. Give your health care provider a list of all the medicines, herbs, non-prescription drugs, or dietary supplements you use. Also tell them if you smoke, drink alcohol, or use illegal drugs. Some items may interact with your medicine. What should I watch for while using this medicine? Visit your doctor or health care professional for regular checkups. It may be some time before you see the benefit from this medicine. Do not stop taking your medicine unless your doctor tells you to. Your doctor may order blood tests or other tests to see how you are doing. Women should inform their doctor if they wish to become pregnant or think they might be pregnant. There is a potential for serious side  effects to an unborn child. Talk to your health care professional or pharmacist for more information. You should make sure that you get enough calcium and vitamin D while you are taking this medicine. Discuss the foods you eat and the vitamins you take with your health care professional. Some people who take this medicine have severe bone, joint, and/or muscle pain. This medicine may also increase your risk for a broken thigh bone. Tell your doctor right away if you have pain in your upper leg or groin. Tell your doctor if you have any pain that does not go away or that gets worse. What side effects may I notice from receiving this medicine? Side effects that you should report to your doctor or health care professional as soon as possible:  allergic reactions like skin rash, itching or hives, swelling of the face, lips, or tongue  black or tarry stools  changes in vision  eye inflammation, pain  high blood pressure  jaw pain, especially burning or cramping  muscle weakness  numb, tingling  pain  swelling of feet or hands  trouble passing urine or change in the amount of urine  unable to move easily Side effects that usually do not require medical attention (report to your doctor or health care professional if they continue or are bothersome):  bone, joint, or muscle pain  constipation  dizzy, drowsy  fever  headache  loss of appetite  nausea, vomiting  pain at site where injected This list may not describe all possible side effects. Call your doctor for medical advice about side effects. You may report side effects to FDA at 1-800-FDA-1088. Where should I keep my medicine? This drug is given in a hospital or clinic and will not be stored at home. NOTE: This sheet is a summary. It may not cover all possible information. If you have questions about this medicine, talk to your doctor, pharmacist, or health care provider.  2020 Elsevier/Gold Standard (2011-05-26  08:49:49)

## 2019-07-30 NOTE — Progress Notes (Signed)
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 His labs today 12/14/2017 show hgb 8.3, platelets 524k. He is doing well overall. He has staples in his right upper leg following a recent surgery and has not been in touch with the surgeons for a f/u of this. He is not receiving physical therapy and is not getting around at home.  He notes that he was not scheduled for a post hospitalization nephrology f/u and has no PCP.  On review of systems, pt reports intermittent fatigue, SOB, pain to the left leg, dizziness, dry mouth and denies fever, chills, night sweats, dysuria and any other accompanying symptoms.     INTERVAL HISTORY:   Andres Fernandez presents today for follow up and continue management of his myeloma/plasma cell leukemia. 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 The patient's last visit with [name] was on 05/22/2019. The pt reports that he is doing well overall.  The pt reports that he has been having some difficulty with his right leg. He had right femoral surgery on it on 11/20/2017 with Dr. Victorino December. He states that when he sits and puts pressure on his right side, his right side becomes painful and his foot becomes numb.  Of note since the patient's last visit, pt has had PET scan completed on 07/02/2019 with results revealing "1.  No FDG avid bone lesions are identified. 2.  Asymmetric uptake within the right, greater than left, palatine tonsils with mild fullness of the right tonsillar soft tissue. ENT consult vs attention on follow up suggested. 3.  Innumerable mixed sclerotic and lytic lesions throughout the axial and appendicular skeleton without associated hypermetabolic activity. 4.   Ancillary CT findings as above.".  He also had a bone marrow biopsy ([id number]) on 07/02/2019 with results showing: "Trilineage hematopoieses with maturation. No plasma cell myeloma identified. Mild anemia and No Rouleaux or circulating plasma cells identified in the peripheral blood."  Lab results today (07/30/19) of CBC w/diff and CMP is as follows: all values are WNL except for RBC at 4.16, HCT at 38.7, CO2 at 20, BUN at 27, Creatinine at 1.72, AST at 14, GFR est non AF am at 42, and GFR est AFR am at 48.  On review of systems, pt reports right hip pain and denies fevers, chills, night sweats, infections and any other symptoms.    REVIEW OF SYSTEMS:   A 10+ POINT REVIEW OF SYSTEMS WAS OBTAINED including neurology, dermatology, psychiatry, cardiac, respiratory, lymph, extremities, GI, GU, Musculoskeletal, constitutional, breasts, reproductive, HEENT.  All pertinent positives are noted in the HPI.  All others are negative.    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   Tobacco abuse     SURGICAL HISTORY: Past Surgical History:  Procedure Laterality Date   APPENDECTOMY     FEMUR IM NAIL Right 11/20/2017   Procedure: RIGHT FEMORAL INTRAMEDULLARY (IM) NAIL;  Surgeon: Nicholes Stairs, MD;  Location: [facility];  Service: Orthopedics;  Laterality: Right;    SOCIAL HISTORY: Social History   Socioeconomic History   Marital status: Single  Spouse name: Not on file   Number of children: Not on file   Years of education: Not on file   Highest education level: Not on file  Occupational History   Not on file  Social Needs   Financial resource strain: Not on file   Food insecurity    Worry: Not on file    Inability: Not on file   Transportation needs    Medical: Not on file    Non-medical: Not on file  Tobacco Use   Smoking  status: Current Every Day Smoker    Packs/day: 0.25    Types: Cigarettes    Last attempt to quit: 11/09/2017    Years since quitting: 1.7   Smokeless tobacco: Never Used   Tobacco comment: 1 cigarette/day as of 09/25/18  Substance and Sexual Activity   Alcohol use: Yes   Drug use: No   Sexual activity: Not on file  Lifestyle   Physical activity    Days per week: Not on file    Minutes per session: Not on file   Stress: Not on file  Relationships   Social connections    Talks on phone: Not on file    Gets together: Not on file    Attends religious service: Not on file    Active member of club or organization: Not on file    Attends meetings of clubs or organizations: Not on file    Relationship status: Not on file   Intimate partner violence    Fear of current or ex partner: Not on file    Emotionally abused: Not on file    Physically abused: Not on file    Forced sexual activity: Not on file  Other Topics Concern   Not on file  Social History Narrative   Not on file    FAMILY HISTORY: Family History  Problem Relation Age of Onset   COPD Mother    Liver cancer Mother     ALLERGIES:  has No Known Allergies.  MEDICATIONS:  . Current Outpatient Medications on File Prior to Visit  Medication Sig Dispense Refill   acyclovir (ZOVIRAX) 400 MG tablet TAKE 1 TABLET BY MOUTH EVERY DAY 30 tablet 6   cholecalciferol (VITAMIN D) 400 units TABS tablet Take 400 Units by mouth daily.     cyclophosphamide (CYTOXAN) 50 MG capsule Take 12 capsules (600 mg total) by mouth once a week. Give on an empty stomach 1 hour before or 2 hours after meals. 36 capsule 1   dexamethasone (DECADRON) 4 MG tablet Take 5 tablets (20 mg total) by mouth once a week. 20 tablet 1   nicotine (NICODERM CQ - DOSED IN MG/24 HOURS) 14 mg/24hr patch PLACE 1 PATCH (14 MG TOTAL) ONTO THE SKIN DAILY. 28 patch 1   ondansetron (ZOFRAN) 4 MG tablet Take 2 tablets (8 mg total) by mouth every 8  (eight) hours as needed for nausea. (Patient not taking: Reported on 09/25/2018) 30 tablet 0   No current facility-administered medications on file prior to visit.     PHYSICAL EXAMINATION:  VS reviewed Vitals:   07/30/19 0951  BP: 103/77  Pulse: 85  Resp: 18  Temp: 97.7 F (36.5 C)  SpO2: 100%      GENERAL:alert, in no acute distress; tenderness to palpation over the lateral aspect of the right hip SKIN: no acute rashes, no significant lesions EYES: conjunctiva are pink and non-injected, sclera anicteric OROPHARYNX: MMM, no exudates, no oropharyngeal erythema or ulceration NECK: supple,  no JVD LYMPH:  no palpable lymphadenopathy in the cervical, axillary or inguinal regions LUNGS: clear to auscultation b/l with normal respiratory effort HEART: regular rate & rhythm ABDOMEN:  normoactive bowel sounds , non tender, not distended. Extremity: no pedal edema PSYCH: alert & oriented x 3 with fluent speech NEURO: no focal motor/sensory deficits    LABORATORY DATA:  I have reviewed the data as listed  . CBC Latest Ref Rng & Units 07/30/2019 05/22/2019 04/07/2019  WBC 4.0 - 10.5 K/uL 9.3 7.8 7.5  Hemoglobin 13.0 - 17.0 g/dL 13.2 12.2(L) 10.3(L)  Hematocrit 39.0 - 52.0 % 38.7(L) 36.9(L) 29.0(L)  Platelets 150 - 400 K/uL 229 192 78(L)   . CBC    Component Value Date/Time   WBC 9.3 07/30/2019 0916   RBC 4.16 (L) 07/30/2019 0916   HGB 13.2 07/30/2019 0916   HGB 10.3 (L) 04/07/2019 1101   HGB 8.3 (L) 12/14/2017 1230   HCT 38.7 (L) 07/30/2019 0916   HCT 23.9 (L) 12/14/2017 1230   PLT 229 07/30/2019 0916   PLT 78 (L) 04/07/2019 1101   PLT 524 (H) 12/14/2017 1230   MCV 93.0 07/30/2019 0916   MCV 88.4 12/14/2017 1230   MCH 31.7 07/30/2019 0916   MCHC 34.1 07/30/2019 0916   RDW 13.1 07/30/2019 0916   RDW 15.3 (H) 12/14/2017 1230   LYMPHSABS 1.6 07/30/2019 0916   LYMPHSABS 1.4 12/14/2017 1230   MONOABS 0.7 07/30/2019 0916   MONOABS 0.6 12/14/2017 1230   EOSABS 0.1  07/30/2019 0916   EOSABS 0.1 12/14/2017 1230   BASOSABS 0.1 07/30/2019 0916   BASOSABS 0.1 12/14/2017 1230     . CMP Latest Ref Rng & Units 07/30/2019 05/22/2019 04/07/2019  Glucose 70 - 99 mg/dL 80 82 91  BUN 8 - 23 mg/dL 27(H) 26(H) 16  Creatinine 0.61 - 1.24 mg/dL 1.72(H) 1.94(H) 1.53(H)  Sodium 135 - 145 mmol/L 142 140 143  Potassium 3.5 - 5.1 mmol/L 4.1 4.5 3.9  Chloride 98 - 111 mmol/L 110 108 109  CO2 22 - 32 mmol/L 20(L) 21(L) 24  Calcium 8.9 - 10.3 mg/dL 9.1 9.3 9.0  Total Protein 6.5 - 8.1 g/dL 7.3 7.5 6.5  Total Bilirubin 0.3 - 1.2 mg/dL 0.3 0.3 <0.2(L)  Alkaline Phos 38 - 126 U/L 68 68 79  AST 15 - 41 U/L 14(L) 10(L) 16  ALT 0 - 44 U/L 15 8 10    07/02/2019 Bone Marrow Biopsy ([id number]) at [facility]    08/07/18 BM Bx:       RADIOGRAPHIC STUDIES: I have personally reviewed the radiological images as listed and agreed with the findings in the report. No results found.  ASSESSMENT & PLAN:   63 y.o. 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 s/p -Hypercalcemia -Renal Failure -Anemia -Extensive Bone lesions -Appears to be primarily Kappa Light chain Myeloma with no overt M spike. Appears to be responding to treatment.- with decreased kappa light chains.   Initial BM Bx showed >95% involvement with kappa restricted serum free light chains. 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 MoL Cy-   08/07/18 BM Bx revealed normocellular bone marrow with trilineage hematopoiesis and 1% plasma cells which indicates the pt is in remission 08/06/18 PET/CT revealed  Expansile lytic lesion involving the left iliac bone, unchanged. Lytic lesion involving the sternum, unchanged. While both are suspicious for myeloma, neither lesion is FDG avid. No hypermetabolic osseous lesions in  the visualized axial and appendicular skeleton. Please note that the bilateral lower extremities  were not imaged. No suspicious lymphadenopathy. Spleen is normal in size.    Pt began Ninlaro 58m maintenance after pt finished 13 cycles of CyBorD and BM bx showed only 1% plasma cells, then resumed CyBorD and held Ninlaro in anticipation of his tandem bone marrow transplants beginning in late December 2019 [facility]  11/19/18 High dose chemotherapy with Melphalan 1459mm2 and autologous stem cell rescue  03/20/19 High dose chemotherapy with Melphalan 20011m2 and autologous stem cell rescue. Non-infectious diarrhea, uncomplicated course otherwise.  2) s/p Prophylactic IM nailing for rt femur  [facility] and is currently progressively back to full time work.  3) Thrombocytopenia resolved   4) Renal insufficiency -- likely primarily related to Myeloma multifactorial MM/TLS/sepsis/antibiotics.Primary light chain nephrology Plan -maintain follow up with nephrology referral to Dr ColMarval RegalaNarda Amberdney for continued treatment -Pt completed 8 weeks of maverick for hep c with Dr ComLinus Salmons 07/08/18.  -Kidney numbers improved. 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 -Hgb improved to 11.3   -monitor   [facility]"Recommendations of care for local oncologist:  Dental evaluation twice a year to prevent increased risk of cavities and gum retraction is recommended. It is recommended that the dentist apply fluoride on one of the visits each year.   An annual evaluation with an eye specialist is important to check for possible cataracts formed from all the steroids used with treatment.   A bone density scan every 2  years will monitor for risk of osteoporosis. One of the most important things to focus on is exercise and healthy eating. Joining a program like the [employer] the [employer] can help regain the strength in the legs that will give a stirdier walk and prevent falls. It is important to do an aerobic exercise suitable for the degree of neurophathy and weakness. Water exercises or recumbent bicycles may be good options. Exercise will also help with neuropathy and reduce the risk of cardiovascular complications from transplant.   Maintenance therapy should be started with lenalidomide 10 mg daily, 21 of 28 days and carfilzomib on days 1 and 15.  Monthly labs with CBC, CMP, SPEP, UPEP, quantitative immunoglobulins, and serum free light chains are recommended for the remainder of the first year post-transplant.  Vaccinations will be started after the 6 month visit with us.[place] now and the next post-transplant visit, the patient should continue care with a PCP and remain on antiviral prophylactic antibiotics for a whole year. Bactrim or Dapsone should be continued until 6 months post-transplant. Acyclovir will be given for a total of 1 year from day of transplant. If the patient or their doctor suspects a treatment related event they should call right away. We will continue to see TerToma Aranery 3 months to monitor progress for the first year."   PLAN: -Discussed pt labwork today, 07/30/19; all values are WNL except for RBC at 4.16, HCT at 38.7, CO2 at 20, BUN at 27, Creatinine at 1.72, AST at 14, GFR est non AF am at 42, and GFR est AFR am  at 45. -Discussed 07/02/2019 PET scan with results revealing "1.  No FDG avid bone lesions are identified. 2.  Asymmetric uptake within the right, greater than left, palatine tonsils with mild fullness of the right tonsillar soft tissue. ENT consult vs attention on follow up suggested. 3.  Innumerable mixed sclerotic and lytic lesions throughout the axial and  appendicular skeleton without associated hypermetabolic activity. 4.  Ancillary CT findings as above." -Discussed 07/02/2019 bone marrow biopsy ([id number])with results showing: "Trilineage hematopoieses with maturation. No plasma cell myeloma identified. Mild anemia and No Rouleaux or circulating plasma cells identified in the peripheral blood." -Discussed Carfilzomib and Revlimid maintainence treatment plan  -He is currently off of his acyclovir and bactrim  -Discussed pts visit with Dr. Norma Fredrickson  -Discussed right hip pain -Discussed medication refills and management -Discussed follow up with [place] regarding his acyclovir and bactrim prescriptions.  -Discussed risks of osteoporosis due to transplant -Recommended increasing calcium intake -Prescribing baby aspirin   FOLLOW UP: -Xray rt femur/hip today -Plz schedule Pamidronate q12 weeks x 4 -Schedule to start maintenance Carfilzomib q2weeks in 2 weeks with labs x 4 -RTC with Dr Irene Limbo with 2nd dose of Carfilzomib for toxicity check.   The total time spent in the appt was 40 minutes and more than 50% was on counseling and direct patient cares.   Andres Lone MD MS AAHIVMS [facility] Hematology/Oncology Physician [facility]  (Office):       [phone] (Work cell):  [phone] (Fax):           [phone]  I, Jacqualyn Posey, am acting as a scribe for Dr. Sullivan Fernandez.   .I have reviewed the above documentation for accuracy and completeness, and I agree with the above. Brunetta Genera MD

## 2019-07-31 ENCOUNTER — Telehealth: Payer: Self-pay | Admitting: Hematology

## 2019-07-31 LAB — KAPPA/LAMBDA LIGHT CHAINS
Kappa free light chain: 31.2 mg/L — ABNORMAL HIGH (ref 3.3–19.4)
Kappa, lambda light chain ratio: 2.86 — ABNORMAL HIGH (ref 0.26–1.65)
Lambda free light chains: 10.9 mg/L (ref 5.7–26.3)

## 2019-07-31 NOTE — Telephone Encounter (Signed)
Scheduled appt per 8/19 los.  Spoke with Patient and he is aware of his appt date and time.

## 2019-08-01 ENCOUNTER — Telehealth: Payer: Self-pay | Admitting: *Deleted

## 2019-08-01 LAB — MULTIPLE MYELOMA PANEL, SERUM
Albumin SerPl Elph-Mcnc: 4.1 g/dL (ref 2.9–4.4)
Albumin/Glob SerPl: 1.6 (ref 0.7–1.7)
Alpha 1: 0.2 g/dL (ref 0.0–0.4)
Alpha2 Glob SerPl Elph-Mcnc: 1.3 g/dL — ABNORMAL HIGH (ref 0.4–1.0)
B-Globulin SerPl Elph-Mcnc: 0.8 g/dL (ref 0.7–1.3)
Gamma Glob SerPl Elph-Mcnc: 0.4 g/dL (ref 0.4–1.8)
Globulin, Total: 2.7 g/dL (ref 2.2–3.9)
IgA: 22 mg/dL — ABNORMAL LOW (ref 61–437)
IgG (Immunoglobin G), Serum: 566 mg/dL — ABNORMAL LOW (ref 603–1613)
IgM (Immunoglobulin M), Srm: 12 mg/dL — ABNORMAL LOW (ref 20–172)
Total Protein ELP: 6.8 g/dL (ref 6.0–8.5)

## 2019-08-01 NOTE — Telephone Encounter (Signed)
Per Dr. Irene Limbo, he will refill acyclovir and cholecalciferol over the weekend. Dexamethasone is part of his new treatment plan and may be started now or a little later. Due to change in treatment, patient will not receive Cyclophosamide - he will receive another type of chemotherapy.  Attempted to contact patient - left voice mail with this information. Advised patient that if he has not heard from pharmacy over weekend, to contact office Monday

## 2019-08-01 NOTE — Telephone Encounter (Signed)
Patient states he has not received the following prescriptions/refills yet. He expected them following his appt with Dr.Kale: Acyclovir, Cholecalciferol, Cyclophosamide, Dexamethasone

## 2019-08-05 ENCOUNTER — Other Ambulatory Visit: Payer: Self-pay | Admitting: Hematology

## 2019-08-05 MED ORDER — ERGOCALCIFEROL 1.25 MG (50000 UT) PO CAPS: 50000.0000 [IU] | ORAL_CAPSULE | ORAL | 6 refills | Status: DC

## 2019-08-05 MED ORDER — ASPIRIN EC 81 MG PO TBEC: 81.0000 mg | DELAYED_RELEASE_TABLET | Freq: Every day | ORAL | 11 refills | Status: DC

## 2019-08-05 MED ORDER — LENALIDOMIDE 10 MG PO CAPS: 10.0000 mg | ORAL_CAPSULE | Freq: Every day | ORAL | 0 refills | Status: DC

## 2019-08-05 MED ORDER — ONDANSETRON HCL 4 MG PO TABS: 8.0000 mg | ORAL_TABLET | Freq: Three times a day (TID) | ORAL | 0 refills | Status: DC | PRN

## 2019-08-05 MED ORDER — ACYCLOVIR 400 MG PO TABS: 400.0000 mg | ORAL_TABLET | Freq: Every day | ORAL | 11 refills | Status: DC

## 2019-08-06 ENCOUNTER — Other Ambulatory Visit: Payer: Self-pay | Admitting: *Deleted

## 2019-08-06 MED ORDER — LENALIDOMIDE 10 MG PO CAPS: 10.0000 mg | ORAL_CAPSULE | Freq: Every day | ORAL | 0 refills | Status: DC

## 2019-08-06 NOTE — Progress Notes (Signed)
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     A cycle is every 21 days:     Bortezomib      Cyclophosphamide      Dexamethasone   **Always confirm dose/schedule in your pharmacy ordering system**  REASON: Other Reason PRIOR TREATMENT: [id number]: CyBorD (Cyclophosphamide (IV) + Bortezomib 1.3 mg/m2 Subcut D1, 4, 8, 11 + Dexamethasone) q21 Days x 8 Cycles TREATMENT RESPONSE: Complete Response (CR)  START OFF PATHWAY REGIMEN - Multiple Myeloma and Other Plasma Cell Dyscrasias   [id number]:BB - Weekly (Carfilzomib 20/70 mg/m2 + Dexamethasone 40 mg) q28 Days:   A cycle is every 28 days:     Carfilzomib      Carfilzomib      Carfilzomib      Dexamethasone      Dexamethasone   **Always confirm dose/schedule in your pharmacy ordering system**  Patient Characteristics: Maintenance Therapy R-ISS Staging: Unknown Disease Classification: Maintenance Therapy Intent of Therapy: Non-Curative / Palliative Intent, Discussed with Patient

## 2019-08-06 NOTE — Telephone Encounter (Signed)
Completed [employer] Patient/Physician agreement for Revlimid with patient by phone as directed by Dr. Irene Limbo. [employer] Auth # [id number], 08/06/2019 Prescription printed and signed by MD. Durene Cal to Oral Chemo pharmacist.

## 2019-08-07 ENCOUNTER — Telehealth: Payer: Self-pay | Admitting: Pharmacist

## 2019-08-07 ENCOUNTER — Telehealth: Payer: Self-pay

## 2019-08-07 DIAGNOSIS — C9010 Plasma cell leukemia not having achieved remission: Secondary | ICD-10-CM

## 2019-08-07 NOTE — Telephone Encounter (Addendum)
Oral Oncology Pharmacist Encounter  Received new prescription for Revlimid (lenalidomide) for the maintenance treatment of plasma cell leukemia / multiple myeloma in conjunction with carfilzomib, planned duration until disease progression or unacceptable toxicity.  Patient is now s/p tandem aSCT, day 100 on 07/02/19 Patient is noted with CR, still with abnormal light chain ratio and MRD+ Maintenance therapy with lenalidomide 10 mg once daily for 21 days on, 7 days off, repeated every 28 days plus carfilzomib at 56 mg/m2 IV on days 1 and 15 of each 28 day cycle is planned to be initiated on 08/13/19  Labs from 07/30/19 assessed, OK for treatment initiation. Noted SCr=1.72, est CrCl ~ 45 mL/min, noted appropriate dose reduction of lenalidomide for current renal function  Current medication list in Epic reviewed, no DDIs with lenalidomide identified.  Patient with acyclovir and aspirin on medication list. Importance of both of the supportive care medications weill be reiterated to patient during initial counseling session.  Prescription for Revlimid will be e-scribed to appropriate specialty pharmacy for dispensing once insurance authorization is approved. Revlimid is not available for dispensing at the [facility] as it is a limited distribution medication.  Oral Oncology Clinic will continue to follow for insurance authorization, copayment issues, initial counseling and start date.  Johny Drilling, PharmD, BCPS, BCOP  08/07/2019 11:59 AM Oral Oncology Clinic [phone]

## 2019-08-07 NOTE — Telephone Encounter (Signed)
Oral Oncology Patient Advocate Encounter  Received notification from [facility] that prior authorization for Revlimid is required.  PA submitted on CoverMyMeds Key [id number] Status is pending  Oral Oncology Clinic will continue to follow.  [place] Patient [place] Phone [phone] Fax [phone] 08/07/2019    12:53 PM

## 2019-08-08 MED ORDER — LENALIDOMIDE 10 MG PO CAPS: 10.0000 mg | ORAL_CAPSULE | Freq: Every day | ORAL | 0 refills | Status: DC

## 2019-08-08 NOTE — Telephone Encounter (Signed)
Oral Oncology Patient Advocate Encounter  Prior Authorization for Revlimid has been approved.    PA# [id number] Effective dates: 08/07/19 through 08/05/20  Oral Oncology Clinic will continue to follow.   [place] Patient [place] Phone [phone] Fax [phone] 08/08/2019    9:58 AM

## 2019-08-08 NOTE — Telephone Encounter (Signed)
Oral Oncology Pharmacist [employer] authorization of Revlimid has been obtained. Revlimid is a limited distribution medication and is not available for dispensing at the [place].  Revlimid prescription has been e-scribed to [facility] specialty pharmacy. Supporting information including insurance information, demographic information, medication list, and insurance authorization has been faxed to dispensing pharmacy.   I will reach out to the patient to discuss initial counseling and site of dispensing once I confirm pharmacy is able to process patient's claim.  Johny Drilling, PharmD, BCPS, BCOP  08/08/2019 2:24 PM Oral Oncology Clinic [phone]

## 2019-08-11 NOTE — Telephone Encounter (Signed)
Oral Chemotherapy Pharmacist Encounter   I spoke with patient for overview of: Revlimid (lenalidomide) for the maintenance treatment of plasma cell leukemia / multiple myeloma in conjunction with carfilzomib, planned duration until disease progression or unacceptable toxicity.   Maintenance therapy with lenalidomide 10 mg once daily for 21 days on, 7 days off, repeated every 28 days plus carfilzomib at 56 mg/m2 IV on days 1 and 15 of each 28 day cycle is planned to be initiated on 08/13/19  Counseled patient on administration, dosing, side effects, monitoring, drug-food interactions, safe handling, storage, and disposal.  Patient will take Revlimid 34m capsules, 1 capsule by mouth once daily, without regard to food, with a full glass of water. Patient will take his Revlimid before bed.  Revlimid will be given 21 days on, 7 days off, repeat every 28 days.  Revlimid start date: 08/13/19  Adverse effects of Revlimid include but are not limited to: nausea, constipation, diarrhea, abdominal pain, rash, fatigue, drug fever, and decreased blood counts.    Reviewed with patient importance of keeping a medication schedule and plan for any missed doses.  Medication reconciliation performed and medication/allergy list updated.  Patient is planning to pick up acyclovir and aspirin prescriptions from his local pharmacy tomorrow. Patient counseled on importance of daily aspirin 865mfor VTE prophylaxis and acyclovir for VZV prophylaxis.  Insurance authorization for Revlimid has been obtained.  Revlimid prescription is being dispensed from [facility] it is a limited distribution medication. He received this in the mail today. He will start it on Wednesday, 08/13/19  All questions answered.  Mr. McGasperoiced understanding and appreciation.   Patient knows to call the office with questions or concerns.  JeJohny DrillingPharmD, BCPS, BCOP  08/11/2019    12:29 PM Oral Oncology  Clinic [phone]

## 2019-08-12 ENCOUNTER — Other Ambulatory Visit: Payer: Self-pay | Admitting: *Deleted

## 2019-08-12 DIAGNOSIS — C9010 Plasma cell leukemia not having achieved remission: Secondary | ICD-10-CM

## 2019-08-13 ENCOUNTER — Inpatient Hospital Stay: Payer: BC Managed Care – PPO | Attending: Hematology

## 2019-08-13 ENCOUNTER — Inpatient Hospital Stay: Payer: BC Managed Care – PPO

## 2019-08-13 ENCOUNTER — Other Ambulatory Visit: Payer: Self-pay

## 2019-08-13 VITALS — BP 128/79 | HR 83 | Temp 98.3°F | Resp 16 | Ht 68.0 in | Wt 168.2 lb

## 2019-08-13 DIAGNOSIS — R682 Dry mouth, unspecified: Secondary | ICD-10-CM | POA: Insufficient documentation

## 2019-08-13 DIAGNOSIS — C9010 Plasma cell leukemia not having achieved remission: Secondary | ICD-10-CM | POA: Diagnosis not present

## 2019-08-13 DIAGNOSIS — Z5111 Encounter for antineoplastic chemotherapy: Secondary | ICD-10-CM | POA: Diagnosis not present

## 2019-08-13 DIAGNOSIS — F1010 Alcohol abuse, uncomplicated: Secondary | ICD-10-CM | POA: Insufficient documentation

## 2019-08-13 DIAGNOSIS — Z7189 Other specified counseling: Secondary | ICD-10-CM

## 2019-08-13 DIAGNOSIS — C9000 Multiple myeloma not having achieved remission: Secondary | ICD-10-CM | POA: Insufficient documentation

## 2019-08-13 DIAGNOSIS — I10 Essential (primary) hypertension: Secondary | ICD-10-CM | POA: Diagnosis not present

## 2019-08-13 DIAGNOSIS — Z7982 Long term (current) use of aspirin: Secondary | ICD-10-CM | POA: Diagnosis not present

## 2019-08-13 DIAGNOSIS — R0602 Shortness of breath: Secondary | ICD-10-CM | POA: Diagnosis not present

## 2019-08-13 DIAGNOSIS — D649 Anemia, unspecified: Secondary | ICD-10-CM | POA: Diagnosis not present

## 2019-08-13 DIAGNOSIS — R42 Dizziness and giddiness: Secondary | ICD-10-CM | POA: Insufficient documentation

## 2019-08-13 DIAGNOSIS — Z79899 Other long term (current) drug therapy: Secondary | ICD-10-CM | POA: Diagnosis not present

## 2019-08-13 DIAGNOSIS — Z23 Encounter for immunization: Secondary | ICD-10-CM | POA: Diagnosis not present

## 2019-08-13 DIAGNOSIS — N400 Enlarged prostate without lower urinary tract symptoms: Secondary | ICD-10-CM | POA: Insufficient documentation

## 2019-08-13 DIAGNOSIS — F17210 Nicotine dependence, cigarettes, uncomplicated: Secondary | ICD-10-CM | POA: Diagnosis not present

## 2019-08-13 DIAGNOSIS — R5383 Other fatigue: Secondary | ICD-10-CM | POA: Diagnosis not present

## 2019-08-13 DIAGNOSIS — N289 Disorder of kidney and ureter, unspecified: Secondary | ICD-10-CM | POA: Diagnosis not present

## 2019-08-13 LAB — CMP (CANCER CENTER ONLY)
ALT: 16 U/L (ref 0–44)
AST: 13 U/L — ABNORMAL LOW (ref 15–41)
Albumin: 4.2 g/dL (ref 3.5–5.0)
Alkaline Phosphatase: 67 U/L (ref 38–126)
Anion gap: 13 (ref 5–15)
BUN: 27 mg/dL — ABNORMAL HIGH (ref 8–23)
CO2: 19 mmol/L — ABNORMAL LOW (ref 22–32)
Calcium: 9.4 mg/dL (ref 8.9–10.3)
Chloride: 110 mmol/L (ref 98–111)
Creatinine: 1.82 mg/dL — ABNORMAL HIGH (ref 0.61–1.24)
GFR, Est AFR Am: 45 mL/min — ABNORMAL LOW (ref >60)
GFR, Estimated: 39 mL/min — ABNORMAL LOW (ref >60)
Glucose, Bld: 110 mg/dL — ABNORMAL HIGH (ref 70–99)
Potassium: 3.9 mmol/L (ref 3.5–5.1)
Sodium: 142 mmol/L (ref 135–145)
Total Bilirubin: <0.2 mg/dL — ABNORMAL LOW (ref 0.3–1.2)
Total Protein: 7.3 g/dL (ref 6.5–8.1)

## 2019-08-13 LAB — CBC WITH DIFFERENTIAL (CANCER CENTER ONLY)
Abs Immature Granulocytes: 0.1 10*3/uL — ABNORMAL HIGH (ref 0.00–0.07)
Basophils Absolute: 0 10*3/uL (ref 0.0–0.1)
Basophils Relative: 0 %
Eosinophils Absolute: 0 10*3/uL (ref 0.0–0.5)
Eosinophils Relative: 0 %
HCT: 38 % — ABNORMAL LOW (ref 39.0–52.0)
Hemoglobin: 12.5 g/dL — ABNORMAL LOW (ref 13.0–17.0)
Immature Granulocytes: 1 %
Lymphocytes Relative: 13 %
Lymphs Abs: 1.7 10*3/uL (ref 0.7–4.0)
MCH: 31.4 pg (ref 26.0–34.0)
MCHC: 32.9 g/dL (ref 30.0–36.0)
MCV: 95.5 fL (ref 80.0–100.0)
Monocytes Absolute: 0.4 10*3/uL (ref 0.1–1.0)
Monocytes Relative: 3 %
Neutro Abs: 10.8 10*3/uL — ABNORMAL HIGH (ref 1.7–7.7)
Neutrophils Relative %: 83 %
Platelet Count: 232 10*3/uL (ref 150–400)
RBC: 3.98 MIL/uL — ABNORMAL LOW (ref 4.22–5.81)
RDW: 13 % (ref 11.5–15.5)
WBC Count: 13.1 10*3/uL — ABNORMAL HIGH (ref 4.0–10.5)
nRBC: 0 % (ref 0.0–0.2)

## 2019-08-13 MED ORDER — SODIUM CHLORIDE 0.9 % IV SOLN
Freq: Once | INTRAVENOUS | Status: AC
  Administered 2019-08-13: 10:00:00 via INTRAVENOUS
  Filled 2019-08-13: qty 250

## 2019-08-13 MED ORDER — SODIUM CHLORIDE 0.9 % IV SOLN
Freq: Once | INTRAVENOUS | Status: DC
  Filled 2019-08-13: qty 250

## 2019-08-13 MED ORDER — DEXTROSE 5 % IV SOLN
21.0000 mg/m2 | Freq: Once | INTRAVENOUS | Status: AC
  Administered 2019-08-13: 40 mg via INTRAVENOUS
  Filled 2019-08-13: qty 15

## 2019-08-13 MED ORDER — SODIUM CHLORIDE 0.9 % IV SOLN
20.0000 mg | Freq: Once | INTRAVENOUS | Status: AC
  Administered 2019-08-13: 11:00:00 20 mg via INTRAVENOUS
  Filled 2019-08-13: qty 20

## 2019-08-13 NOTE — Patient Instructions (Signed)
[facility] Discharge Instructions for Patients Receiving Chemotherapy  Today you received the following chemotherapy agents :  carfilzomib (Kyprolis)  To help prevent nausea and vomiting after your treatment, we encourage you to take your nausea medication as prescribed.   If you develop nausea and vomiting that is not controlled by your nausea medication, call the clinic.   BELOW ARE SYMPTOMS THAT SHOULD BE REPORTED IMMEDIATELY:  *FEVER GREATER THAN 100.5 F  *CHILLS WITH OR WITHOUT FEVER  NAUSEA AND VOMITING THAT IS NOT CONTROLLED WITH YOUR NAUSEA MEDICATION  *UNUSUAL SHORTNESS OF BREATH  *UNUSUAL BRUISING OR BLEEDING  TENDERNESS IN MOUTH AND THROAT WITH OR WITHOUT PRESENCE OF ULCERS  *URINARY PROBLEMS  *BOWEL PROBLEMS  UNUSUAL RASH Items with * indicate a potential emergency and should be followed up as soon as possible.  Feel free to call the clinic should you have any questions or concerns. The clinic phone number is [phone].  Please show the CHEMO ALERT CARD at check-in to the Emergency Department and triage nurse.   

## 2019-08-13 NOTE — Progress Notes (Signed)
Per Dr Irene Limbo OK to proceed with carfilzomib with CRT of 1.82

## 2019-08-22 NOTE — Addendum Note (Signed)
Addended by: Neysa Hotter on: 08/22/2019 05:04 PM   Modules accepted: Orders

## 2019-08-23 NOTE — Progress Notes (Signed)
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 His labs today 12/14/2017 show hgb 8.3, platelets 524k. He is doing well overall. He has staples in his right upper leg following a recent surgery and has not been in touch with the surgeons for a f/u of this. He is not receiving physical therapy and is not getting around at home.  He notes that he was not scheduled for a post hospitalization nephrology f/u and has no PCP.  On review of systems, pt reports intermittent fatigue, SOB, pain to the left leg, dizziness, dry mouth and denies fever, chills, night sweats, dysuria and any other accompanying symptoms.     INTERVAL HISTORY:    Andres Fernandez presents today for follow up and continue management of his myeloma/plasma cell leukemia. 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 He is here for C1D15 of maintenance Carfilzomib. The patient's last visit with [name] was on 07/30/2019. The pt reports that he is doing well overall.   The pt reports that he started Revlimid about two weeks ago. He has had no issues with taking either Revlimid or Carfilzomib. He has been taking his Acyclovir, Folic Acid, Vitamin D2 regularly but is no longer taking Bactrim. He is scheduled to receive his next bone shot in November. Pt has a follow up with his transplant team in October. He is still working and notes some fatigue while at work.   Lab results today (08/27/19) of CBC w/diff and CMP is as follows: all values are WNL except for RBC at 3.81, Hgb at 11.9, HCT at 35.9, Monocytes Abs at 1.1K, CO2 at 19, BUN at 28, Creatinine at 1.78, Calcium at 8.7, AST at 10, GFR Est AFR Am at 46.  On review of systems, pt reports fatigue and denies  any other symptoms.    REVIEW OF SYSTEMS:    A 10+ POINT REVIEW OF SYSTEMS WAS OBTAINED including neurology, dermatology, psychiatry, cardiac, respiratory, lymph, extremities, GI, GU, Musculoskeletal, constitutional, breasts, reproductive, HEENT.  All pertinent positives are noted in the HPI.  All others are negative.   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   Tobacco abuse     SURGICAL HISTORY: Past Surgical History:  Procedure Laterality Date   APPENDECTOMY     FEMUR IM NAIL Right 11/20/2017   Procedure: RIGHT FEMORAL INTRAMEDULLARY (IM) NAIL;  Surgeon: Nicholes Stairs, MD;  Location: [place];  Service: Orthopedics;  Laterality: Right;    SOCIAL HISTORY: Social History   Socioeconomic History   Marital status: Single    Spouse name: Not on file   Number of children: Not on file   Years of education: Not on file   Highest education level: Not on file  Occupational History   Not on file  Social Needs   Financial resource strain: Not on file   Food insecurity    Worry: Not on file    Inability: Not on file   Transportation needs    Medical: Not on file    Non-medical: Not on file  Tobacco Use   Smoking status:  Current Every Day Smoker    Packs/day: 0.25    Types: Cigarettes    Last attempt to quit: 11/09/2017    Years since quitting: 1.7   Smokeless tobacco: Never Used   Tobacco comment: 1 cigarette/day as of 09/25/18  Substance and Sexual Activity   Alcohol use: Yes   Drug use: No   Sexual activity: Not on file  Lifestyle   Physical activity    Days per week: Not on file    Minutes per session: Not on file   Stress: Not on file  Relationships   Social connections    Talks on phone: Not on file    Gets together: Not on file    Attends religious service: Not on file    Active member of club  or organization: Not on file    Attends meetings of clubs or organizations: Not on file    Relationship status: Not on file   Intimate partner violence    Fear of current or ex partner: Not on file    Emotionally abused: Not on file    Physically abused: Not on file    Forced sexual activity: Not on file  Other Topics Concern   Not on file  Social History Narrative   Not on file    FAMILY HISTORY: Family History  Problem Relation Age of Onset   COPD Mother    Liver cancer Mother     ALLERGIES:  has No Known Allergies.  MEDICATIONS:  . Current Outpatient Medications on File Prior to Visit  Medication Sig Dispense Refill   acyclovir (ZOVIRAX) 400 MG tablet Take 1 tablet (400 mg total) by mouth daily. 30 tablet 11   aspirin EC 81 MG tablet Take 1 tablet (81 mg total) by mouth daily. 60 tablet 11   cholecalciferol (VITAMIN D) 400 units TABS tablet Take 400 Units by mouth daily.     dexamethasone (DECADRON) 4 MG tablet Take 5 tablets (20 mg total) by mouth once a week. 20 tablet 1   ergocalciferol (VITAMIN D2) 1.25 MG (50000 UT) capsule Take 1 capsule (50,000 Units total) by mouth once a week. 12 capsule 6   lenalidomide (REVLIMID) 10 MG capsule Take 1 capsule (10 mg total) by mouth daily. Take for 21 days on, 7 days off, repeat every 28 days. [employer]# [id number], Date obtained 08/06/19 21 capsule 0   nicotine (NICODERM CQ - DOSED IN MG/24 HOURS) 14 mg/24hr patch PLACE 1 PATCH (14 MG TOTAL) ONTO THE SKIN DAILY. 28 patch 1   ondansetron (ZOFRAN) 4 MG tablet Take 2 tablets (8 mg total) by mouth every 8 (eight) hours as needed for nausea. 30 tablet 0   No current facility-administered medications on file prior to visit.     PHYSICAL EXAMINATION:  VS reviewed Vitals:   08/27/19 1058  BP: 106/72  Pulse: 72  Resp: 18  Temp: 98.3 F (36.8 C)  SpO2: 100%     GENERAL:alert, in no acute distress and comfortable SKIN: no acute rashes, no significant lesions EYES:  conjunctiva are pink and non-injected, sclera anicteric OROPHARYNX: MMM, no exudates, no oropharyngeal erythema or ulceration NECK: supple, no JVD LYMPH:  no palpable lymphadenopathy in the cervical, axillary or inguinal regions LUNGS: clear to auscultation b/l with normal respiratory effort HEART: regular rate & rhythm ABDOMEN:  normoactive bowel sounds , non tender, not distended. No palpable hepatosplenomegaly.  Extremity: no pedal edema PSYCH: alert & oriented x 3 with fluent speech NEURO: no focal motor/sensory deficit  LABORATORY DATA:  I have reviewed the data as listed  . CBC Latest Ref Rng & Units 08/27/2019 08/13/2019 07/30/2019  WBC 4.0 - 10.5 K/uL 8.8 13.1(H) 9.3  Hemoglobin 13.0 - 17.0 g/dL 11.9(L) 12.5(L) 13.2  Hematocrit 39.0 - 52.0 % 35.9(L) 38.0(L) 38.7(L)  Platelets 150 - 400 K/uL 203 232 229   . CBC    Component Value Date/Time   WBC 8.8 08/27/2019 0922   WBC 9.3 07/30/2019 0916   RBC 3.81 (L) 08/27/2019 0922   HGB 11.9 (L) 08/27/2019 0922   HGB 8.3 (L) 12/14/2017 1230   HCT 35.9 (L) 08/27/2019 0922   HCT 23.9 (L) 12/14/2017 1230   PLT 203 08/27/2019 0922   PLT 524 (H) 12/14/2017 1230   MCV 94.2 08/27/2019 0922   MCV 88.4 12/14/2017 1230   MCH 31.2 08/27/2019 0922   MCHC 33.1 08/27/2019 0922   RDW 13.1 08/27/2019 0922   RDW 15.3 (H) 12/14/2017 1230   LYMPHSABS 1.5 08/27/2019 0922   LYMPHSABS 1.4 12/14/2017 1230   MONOABS 1.1 (H) 08/27/2019 0922   MONOABS 0.6 12/14/2017 1230   EOSABS 0.3 08/27/2019 0922   EOSABS 0.1 12/14/2017 1230   BASOSABS 0.0 08/27/2019 0922   BASOSABS 0.1 12/14/2017 1230     . CMP Latest Ref Rng & Units 08/27/2019 08/13/2019 07/30/2019  Glucose 70 - 99 mg/dL 77 110(H) 80  BUN 8 - 23 mg/dL 28(H) 27(H) 27(H)  Creatinine 0.61 - 1.24 mg/dL 1.78(H) 1.82(H) 1.72(H)  Sodium 135 - 145 mmol/L 140 142 142  Potassium 3.5 - 5.1 mmol/L 4.2 3.9 4.1  Chloride 98 - 111 mmol/L 109 110 110  CO2 22 - 32 mmol/L 19(L) 19(L) 20(L)  Calcium 8.9 -  10.3 mg/dL 8.7(L) 9.4 9.1  Total Protein 6.5 - 8.1 g/dL 7.0 7.3 7.3  Total Bilirubin 0.3 - 1.2 mg/dL 0.3 <0.2(L) 0.3  Alkaline Phos 38 - 126 U/L 84 67 68  AST 15 - 41 U/L 10(L) 13(L) 14(L)  ALT 0 - 44 U/L 11 16 15    07/02/2019 Bone Marrow Biopsy ([id number]) at [facility]    08/07/18 BM Bx:       RADIOGRAPHIC STUDIES: I have personally reviewed the radiological images as listed and agreed with the findings in the report. No results found.  ASSESSMENT & PLAN:   63 y.o. 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 Appears to be responding to treatment.- with decreased kappa light chains.   Initial BM Bx showed >95% involvement with kappa restricted serum free light chains. 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 MoL Cy-   08/07/18 BM Bx revealed normocellular bone marrow with trilineage hematopoiesis and 1% plasma cells which indicates the pt is in remission 08/06/18 PET/CT revealed  Expansile lytic lesion involving the left iliac bone, unchanged. Lytic lesion involving the sternum, unchanged. While both are suspicious for myeloma, neither lesion is FDG avid. No hypermetabolic osseous lesions in the visualized axial and appendicular skeleton. Please note that the bilateral lower extremities were not imaged. No suspicious lymphadenopathy. Spleen is normal in size.    Pt began Ninlaro 40m maintenance after pt finished 13 cycles of CyBorD and BM bx showed only 1% plasma cells, then resumed CyBorD and held Ninlaro in anticipation of his tandem  bone marrow transplants beginning in late December 2019 with Dr. Blinda Leatherwood at [facility]   11/19/18 High dose chemotherapy with Melphalan 188m/m2 and autologous  stem cell rescue  03/20/19 High dose chemotherapy with Melphalan 2035mm2 and autologous stem cell rescue. Non-infectious diarrhea, uncomplicated course otherwise.  07/02/2019 PET scan with results revealing "1. No FDG avid bone lesions are identified. 2.  Asymmetric uptake within the right, greater than left, palatine tonsils with mild fullness of the right tonsillar soft tissue. ENT consult vs attention on follow up suggested. 3.  Innumerable mixed sclerotic and lytic lesions throughout the axial and appendicular skeleton without associated hypermetabolic activity. 4.  Ancillary CT findings as above."  07/02/2019 bone marrow biopsy ([id number])with results showing: "Trilineage hematopoieses with maturation. No plasma cell myeloma identified. Mild anemia and No Rouleaux or circulating plasma cells identified in the peripheral blood."  2) s/p Prophylactic IM nailing for rt femur  [facility]patient previously reported improvement and is currently progressively back to full time work.  3) Thrombocytopenia resolved   4) Renal insufficiency -- likely primarily related to Myeloma multifactorial MM/TLS/sepsis/antibiotics.Primary light chain nephrology Plan -maintain follow up with nephrology referral to Dr CoMarval RegalCNarda Amberidney for continued treatment -Pt completed 8 weeks of maverick for hep c with Dr CoLinus Salmonsn 07/08/18.  -Kidney numbers improved. 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 -Hgb improved to 11.3   -monitor   [facility] "Recommendations of care for local oncologist:  Dental evaluation twice a year to prevent increased risk of cavities and gum retraction is  recommended. It is recommended that the dentist apply fluoride on one of the visits each year.   An annual evaluation with an eye specialist is important to check for possible cataracts formed from all the steroids used with treatment.   A bone density scan every 2 years will monitor for risk of osteoporosis. One of the most important things to focus on is exercise and healthy eating. Joining a program like the [employer] [facility] [employer] can help regain the strength in the legs that will give a stirdier walk and prevent falls. It is important to do an aerobic exercise suitable for the degree of neurophathy and weakness. Water exercises or recumbent bicycles may be good options. Exercise will also help with neuropathy and reduce the risk of cardiovascular complications from transplant.   Maintenance therapy should be started with lenalidomide 10 mg daily, 21 of 28 days and carfilzomib on days 1 and 15.  Monthly labs with CBC, CMP, SPEP, UPEP, quantitative immunoglobulins, and serum free light chains are recommended for the remainder of the first year post-transplant.  Vaccinations will be started after the 6 month visit with [place] Between now and the next post-transplant visit, the patient should continue care with a PCP and remain on antiviral prophylactic antibiotics for a whole year. Bactrim or Dapsone should be continued until 6 months post-transplant. Acyclovir will be given for a total of 1 year from day of transplant. If the patient or their doctor suspects a treatment related event they should call right away. We will continue to see TeToma Aranvery 3 months to monitor progress for the first year."  PLAN: -Discussed pt labwork today, 08/27/19; all values are WNL except for RBC at 3.81, Hgb at 11.9, HCT at 35.9, Monocytes Abs at 1.1K, CO2 at 19, BUN at 28, Creatinine at 1.78, Calcium at 8.7, AST at 10, GFR Est AFR Am at 46. -Discussed 07/30/2019 MMP is as follows: all values are WNL  except for IgG at 566, IgA at 22, IgM at 12, Alpha2 Glob at 1.3.  -Discussed 07/30/2019 K/L light chains is as follows: Kappa free light chain at 31.2, Lambda free light chains at 10.9, Kappa lamda light chain ratio at 2.86. -Pt has no prohibitive toxicities from the C1D15 of Carfilzomib and Revlimid. -Advised pt that we will continue current therapy until progression or toxicities. -Advised pt to begin using an enteric coated baby Asprin 81 mg -Advised pt that he could begin taking a multivitamin -Discussed waiting 1 year+ after transplant to get a colonoscopy -Will see back in 2 weeks with labs    FOLLOW UP: -Continue f/u as per next 2 scheduled appointments q2weeks with labs - continue Pamidronate q3 months  The total time spent in the appt was 20 minutes and more than 50% was on counseling and direct patient cares.  All of the patient's questions were answered with apparent satisfaction. The patient knows to call the clinic with any problems, questions or concerns.  Sullivan Lone MD [place] AAHIVMS [facility] Hematology/Oncology Physician [facility]  (Office):       [phone] (Work cell):  [phone] (Fax):           [phone]  I, Yevette Edwards, am acting as a scribe for Dr. Sullivan Lone.   .I have reviewed the above documentation for accuracy and completeness, and I agree with the above. Brunetta Genera MD

## 2019-08-26 ENCOUNTER — Other Ambulatory Visit: Payer: Self-pay | Admitting: *Deleted

## 2019-08-26 DIAGNOSIS — C9010 Plasma cell leukemia not having achieved remission: Secondary | ICD-10-CM

## 2019-08-27 ENCOUNTER — Inpatient Hospital Stay (HOSPITAL_BASED_OUTPATIENT_CLINIC_OR_DEPARTMENT_OTHER): Payer: BC Managed Care – PPO | Admitting: Hematology

## 2019-08-27 ENCOUNTER — Inpatient Hospital Stay: Payer: BC Managed Care – PPO

## 2019-08-27 ENCOUNTER — Other Ambulatory Visit: Payer: Self-pay

## 2019-08-27 VITALS — BP 106/72 | HR 72 | Temp 98.3°F | Resp 18 | Ht 68.0 in | Wt 162.5 lb

## 2019-08-27 DIAGNOSIS — C9010 Plasma cell leukemia not having achieved remission: Secondary | ICD-10-CM | POA: Diagnosis not present

## 2019-08-27 DIAGNOSIS — Z7189 Other specified counseling: Secondary | ICD-10-CM

## 2019-08-27 DIAGNOSIS — C9011 Plasma cell leukemia in remission: Secondary | ICD-10-CM

## 2019-08-27 DIAGNOSIS — Z0000 Encounter for general adult medical examination without abnormal findings: Secondary | ICD-10-CM

## 2019-08-27 DIAGNOSIS — Z5111 Encounter for antineoplastic chemotherapy: Secondary | ICD-10-CM | POA: Diagnosis not present

## 2019-08-27 LAB — CMP (CANCER CENTER ONLY)
ALT: 11 U/L (ref 0–44)
AST: 10 U/L — ABNORMAL LOW (ref 15–41)
Albumin: 3.9 g/dL (ref 3.5–5.0)
Alkaline Phosphatase: 84 U/L (ref 38–126)
Anion gap: 12 (ref 5–15)
BUN: 28 mg/dL — ABNORMAL HIGH (ref 8–23)
CO2: 19 mmol/L — ABNORMAL LOW (ref 22–32)
Calcium: 8.7 mg/dL — ABNORMAL LOW (ref 8.9–10.3)
Chloride: 109 mmol/L (ref 98–111)
Creatinine: 1.78 mg/dL — ABNORMAL HIGH (ref 0.61–1.24)
GFR, Est AFR Am: 46 mL/min — ABNORMAL LOW (ref >60)
GFR, Estimated: 40 mL/min — ABNORMAL LOW (ref >60)
Glucose, Bld: 77 mg/dL (ref 70–99)
Potassium: 4.2 mmol/L (ref 3.5–5.1)
Sodium: 140 mmol/L (ref 135–145)
Total Bilirubin: 0.3 mg/dL (ref 0.3–1.2)
Total Protein: 7 g/dL (ref 6.5–8.1)

## 2019-08-27 LAB — CBC WITH DIFFERENTIAL (CANCER CENTER ONLY)
Abs Immature Granulocytes: 0.04 10*3/uL (ref 0.00–0.07)
Basophils Absolute: 0 10*3/uL (ref 0.0–0.1)
Basophils Relative: 0 %
Eosinophils Absolute: 0.3 10*3/uL (ref 0.0–0.5)
Eosinophils Relative: 3 %
HCT: 35.9 % — ABNORMAL LOW (ref 39.0–52.0)
Hemoglobin: 11.9 g/dL — ABNORMAL LOW (ref 13.0–17.0)
Immature Granulocytes: 1 %
Lymphocytes Relative: 18 %
Lymphs Abs: 1.5 10*3/uL (ref 0.7–4.0)
MCH: 31.2 pg (ref 26.0–34.0)
MCHC: 33.1 g/dL (ref 30.0–36.0)
MCV: 94.2 fL (ref 80.0–100.0)
Monocytes Absolute: 1.1 10*3/uL — ABNORMAL HIGH (ref 0.1–1.0)
Monocytes Relative: 12 %
Neutro Abs: 5.8 10*3/uL (ref 1.7–7.7)
Neutrophils Relative %: 66 %
Platelet Count: 203 10*3/uL (ref 150–400)
RBC: 3.81 MIL/uL — ABNORMAL LOW (ref 4.22–5.81)
RDW: 13.1 % (ref 11.5–15.5)
WBC Count: 8.8 10*3/uL (ref 4.0–10.5)
nRBC: 0 % (ref 0.0–0.2)

## 2019-08-27 MED ORDER — INFLUENZA VAC SPLIT QUAD 0.5 ML IM SUSY
PREFILLED_SYRINGE | INTRAMUSCULAR | Status: AC
  Filled 2019-08-27: qty 0.5

## 2019-08-27 MED ORDER — SODIUM CHLORIDE 0.9 % IV SOLN
Freq: Once | INTRAVENOUS | Status: AC
  Administered 2019-08-27: 13:00:00 via INTRAVENOUS
  Filled 2019-08-27: qty 250

## 2019-08-27 MED ORDER — ACETAMINOPHEN 325 MG PO TABS
650.0000 mg | ORAL_TABLET | Freq: Once | ORAL | Status: AC
  Administered 2019-08-27: 650 mg via ORAL

## 2019-08-27 MED ORDER — INFLUENZA VAC SPLIT QUAD 0.5 ML IM SUSY
0.5000 mL | PREFILLED_SYRINGE | Freq: Once | INTRAMUSCULAR | Status: AC
  Administered 2019-08-27: 0.5 mL via INTRAMUSCULAR

## 2019-08-27 MED ORDER — FAMOTIDINE 20 MG PO TABS
20.0000 mg | ORAL_TABLET | Freq: Once | ORAL | Status: AC
  Administered 2019-08-27: 12:00:00 20 mg via ORAL

## 2019-08-27 MED ORDER — SODIUM CHLORIDE 0.9 % IV SOLN
Freq: Once | INTRAVENOUS | Status: AC
  Administered 2019-08-27: 12:00:00 via INTRAVENOUS
  Filled 2019-08-27: qty 250

## 2019-08-27 MED ORDER — FAMOTIDINE 20 MG PO TABS
ORAL_TABLET | ORAL | Status: AC
  Filled 2019-08-27: qty 1

## 2019-08-27 MED ORDER — SODIUM CHLORIDE 0.9 % IV SOLN
INTRAVENOUS | Status: AC
  Filled 2019-08-27: qty 250

## 2019-08-27 MED ORDER — DEXTROSE 5 % IV SOLN
37.0000 mg/m2 | Freq: Once | INTRAVENOUS | Status: AC
  Administered 2019-08-27: 70 mg via INTRAVENOUS
  Filled 2019-08-27: qty 30

## 2019-08-27 MED ORDER — SODIUM CHLORIDE 0.9 % IV SOLN
20.0000 mg | Freq: Once | INTRAVENOUS | Status: AC
  Administered 2019-08-27: 20 mg via INTRAVENOUS
  Filled 2019-08-27: qty 20

## 2019-08-27 MED ORDER — ACETAMINOPHEN 325 MG PO TABS
ORAL_TABLET | ORAL | Status: AC
  Filled 2019-08-27: qty 2

## 2019-08-27 NOTE — Patient Instructions (Signed)
[facility] Discharge Instructions for Patients Receiving Chemotherapy  Today you received the following chemotherapy agents :  carfilzomib (Kyprolis)  To help prevent nausea and vomiting after your treatment, we encourage you to take your nausea medication as prescribed.   If you develop nausea and vomiting that is not controlled by your nausea medication, call the clinic.   BELOW ARE SYMPTOMS THAT SHOULD BE REPORTED IMMEDIATELY:  *FEVER GREATER THAN 100.5 F  *CHILLS WITH OR WITHOUT FEVER  NAUSEA AND VOMITING THAT IS NOT CONTROLLED WITH YOUR NAUSEA MEDICATION  *UNUSUAL SHORTNESS OF BREATH  *UNUSUAL BRUISING OR BLEEDING  TENDERNESS IN MOUTH AND THROAT WITH OR WITHOUT PRESENCE OF ULCERS  *URINARY PROBLEMS  *BOWEL PROBLEMS  UNUSUAL RASH Items with * indicate a potential emergency and should be followed up as soon as possible.  Feel free to call the clinic should you have any questions or concerns. The clinic phone number is [phone].  Please show the CHEMO ALERT CARD at check-in to the Emergency Department and triage nurse.   

## 2019-08-28 ENCOUNTER — Telehealth: Payer: Self-pay | Admitting: Hematology

## 2019-08-28 NOTE — Telephone Encounter (Signed)
Per 9/16 los appts already scheduled.

## 2019-09-02 ENCOUNTER — Other Ambulatory Visit: Payer: Self-pay | Admitting: *Deleted

## 2019-09-02 DIAGNOSIS — C9010 Plasma cell leukemia not having achieved remission: Secondary | ICD-10-CM

## 2019-09-02 MED ORDER — LENALIDOMIDE 10 MG PO CAPS: 10.0000 mg | ORAL_CAPSULE | Freq: Every day | ORAL | 0 refills | Status: DC

## 2019-09-02 NOTE — Telephone Encounter (Signed)
Received faxed refill request from [facility] for Revlimid 10 mg Refilled per Dr.Kale OV note 08/27/2019 Refill escribed to [facility] by Westley Gambles, [place] - [id number] [facility] CV:[id number], 09/02/2019

## 2019-09-05 ENCOUNTER — Telehealth: Payer: Self-pay

## 2019-09-05 NOTE — Telephone Encounter (Signed)
Pt called stating that he is scheduled for doctors appointments on 9/30. He also stated that he would try to move Dr Norma Fredrickson appointment to a later time since he is scheduled for a 2 hour infusion appointment starting at 73 o clock. Pt stated that he would give nurse a call back on Monday if he was able to make the change.

## 2019-09-10 ENCOUNTER — Other Ambulatory Visit: Payer: Self-pay

## 2019-09-10 ENCOUNTER — Inpatient Hospital Stay: Payer: BC Managed Care – PPO

## 2019-09-10 VITALS — BP 110/73 | HR 81 | Temp 98.1°F | Resp 18 | Wt 161.8 lb

## 2019-09-10 DIAGNOSIS — C9010 Plasma cell leukemia not having achieved remission: Secondary | ICD-10-CM

## 2019-09-10 DIAGNOSIS — C9011 Plasma cell leukemia in remission: Secondary | ICD-10-CM

## 2019-09-10 DIAGNOSIS — Z5111 Encounter for antineoplastic chemotherapy: Secondary | ICD-10-CM

## 2019-09-10 DIAGNOSIS — Z7189 Other specified counseling: Secondary | ICD-10-CM

## 2019-09-10 LAB — CMP (CANCER CENTER ONLY)
ALT: 14 U/L (ref 0–44)
AST: 12 U/L — ABNORMAL LOW (ref 15–41)
Albumin: 4.1 g/dL (ref 3.5–5.0)
Alkaline Phosphatase: 80 U/L (ref 38–126)
Anion gap: 11 (ref 5–15)
BUN: 25 mg/dL — ABNORMAL HIGH (ref 8–23)
CO2: 18 mmol/L — ABNORMAL LOW (ref 22–32)
Calcium: 8.7 mg/dL — ABNORMAL LOW (ref 8.9–10.3)
Chloride: 111 mmol/L (ref 98–111)
Creatinine: 1.68 mg/dL — ABNORMAL HIGH (ref 0.61–1.24)
GFR, Est AFR Am: 50 mL/min — ABNORMAL LOW (ref >60)
GFR, Estimated: 43 mL/min — ABNORMAL LOW (ref >60)
Glucose, Bld: 95 mg/dL (ref 70–99)
Potassium: 4.3 mmol/L (ref 3.5–5.1)
Sodium: 140 mmol/L (ref 135–145)
Total Bilirubin: 0.4 mg/dL (ref 0.3–1.2)
Total Protein: 7.1 g/dL (ref 6.5–8.1)

## 2019-09-10 LAB — CBC WITH DIFFERENTIAL/PLATELET
Abs Immature Granulocytes: 0.03 10*3/uL (ref 0.00–0.07)
Basophils Absolute: 0.1 10*3/uL (ref 0.0–0.1)
Basophils Relative: 2 %
Eosinophils Absolute: 0.1 10*3/uL (ref 0.0–0.5)
Eosinophils Relative: 1 %
HCT: 37.2 % — ABNORMAL LOW (ref 39.0–52.0)
Hemoglobin: 12.7 g/dL — ABNORMAL LOW (ref 13.0–17.0)
Immature Granulocytes: 0 %
Lymphocytes Relative: 27 %
Lymphs Abs: 2 10*3/uL (ref 0.7–4.0)
MCH: 31.4 pg (ref 26.0–34.0)
MCHC: 34.1 g/dL (ref 30.0–36.0)
MCV: 91.9 fL (ref 80.0–100.0)
Monocytes Absolute: 0.6 10*3/uL (ref 0.1–1.0)
Monocytes Relative: 8 %
Neutro Abs: 4.4 10*3/uL (ref 1.7–7.7)
Neutrophils Relative %: 62 %
Platelets: 250 10*3/uL (ref 150–400)
RBC: 4.05 MIL/uL — ABNORMAL LOW (ref 4.22–5.81)
RDW: 13.5 % (ref 11.5–15.5)
WBC: 7.2 10*3/uL (ref 4.0–10.5)
nRBC: 0 % (ref 0.0–0.2)

## 2019-09-10 MED ORDER — ACETAMINOPHEN 325 MG PO TABS
650.0000 mg | ORAL_TABLET | Freq: Once | ORAL | Status: AC
  Administered 2019-09-10: 650 mg via ORAL

## 2019-09-10 MED ORDER — FAMOTIDINE 20 MG PO TABS
20.0000 mg | ORAL_TABLET | Freq: Once | ORAL | Status: AC
  Administered 2019-09-10: 20 mg via ORAL

## 2019-09-10 MED ORDER — SODIUM CHLORIDE 0.9 % IV SOLN
20.0000 mg | Freq: Once | INTRAVENOUS | Status: AC
  Administered 2019-09-10: 20 mg via INTRAVENOUS
  Filled 2019-09-10: qty 20

## 2019-09-10 MED ORDER — DEXTROSE 5 % IV SOLN
53.0000 mg/m2 | Freq: Once | INTRAVENOUS | Status: AC
  Administered 2019-09-10: 100 mg via INTRAVENOUS
  Filled 2019-09-10: qty 5

## 2019-09-10 MED ORDER — HEPARIN SOD (PORK) LOCK FLUSH 100 UNIT/ML IV SOLN
500.0000 [IU] | Freq: Once | INTRAVENOUS | Status: DC | PRN
  Filled 2019-09-10: qty 5

## 2019-09-10 MED ORDER — SODIUM CHLORIDE 0.9 % IV SOLN
Freq: Once | INTRAVENOUS | Status: AC
  Administered 2019-09-10: 11:00:00 via INTRAVENOUS
  Filled 2019-09-10: qty 250

## 2019-09-10 MED ORDER — SODIUM CHLORIDE 0.9 % IV SOLN
INTRAVENOUS | Status: AC
  Filled 2019-09-10: qty 250

## 2019-09-10 MED ORDER — ACETAMINOPHEN 325 MG PO TABS
ORAL_TABLET | ORAL | Status: AC
  Filled 2019-09-10: qty 2

## 2019-09-10 MED ORDER — SODIUM CHLORIDE 0.9% FLUSH
10.0000 mL | INTRAVENOUS | Status: DC | PRN
  Filled 2019-09-10: qty 10

## 2019-09-10 MED ORDER — SODIUM CHLORIDE 0.9 % IV SOLN
Freq: Once | INTRAVENOUS | Status: DC
  Filled 2019-09-10: qty 250

## 2019-09-10 MED ORDER — FAMOTIDINE 20 MG PO TABS
ORAL_TABLET | ORAL | Status: AC
  Filled 2019-09-10: qty 1

## 2019-09-10 MED ORDER — FAMOTIDINE IN NACL 20-0.9 MG/50ML-% IV SOLN
INTRAVENOUS | Status: AC
  Filled 2019-09-10: qty 50

## 2019-09-10 NOTE — Patient Instructions (Signed)
[facility] Discharge Instructions for Patients Receiving Chemotherapy  Today you received the following chemotherapy agents :  carfilzomib (Kyprolis)  To help prevent nausea and vomiting after your treatment, we encourage you to take your nausea medication as prescribed.   If you develop nausea and vomiting that is not controlled by your nausea medication, call the clinic.   BELOW ARE SYMPTOMS THAT SHOULD BE REPORTED IMMEDIATELY:  *FEVER GREATER THAN 100.5 F  *CHILLS WITH OR WITHOUT FEVER  NAUSEA AND VOMITING THAT IS NOT CONTROLLED WITH YOUR NAUSEA MEDICATION  *UNUSUAL SHORTNESS OF BREATH  *UNUSUAL BRUISING OR BLEEDING  TENDERNESS IN MOUTH AND THROAT WITH OR WITHOUT PRESENCE OF ULCERS  *URINARY PROBLEMS  *BOWEL PROBLEMS  UNUSUAL RASH Items with * indicate a potential emergency and should be followed up as soon as possible.  Feel free to call the clinic should you have any questions or concerns. The clinic phone number is [phone].  Please show the CHEMO ALERT CARD at check-in to the Emergency Department and triage nurse.   

## 2019-09-10 NOTE — Progress Notes (Signed)
Per Dr. Irene Limbo, ok to treat with Scr 1.68.

## 2019-09-19 ENCOUNTER — Other Ambulatory Visit: Payer: BC Managed Care – PPO

## 2019-09-19 ENCOUNTER — Ambulatory Visit: Payer: BC Managed Care – PPO | Admitting: Hematology

## 2019-09-19 ENCOUNTER — Ambulatory Visit: Payer: BC Managed Care – PPO

## 2019-09-24 ENCOUNTER — Inpatient Hospital Stay: Payer: BC Managed Care – PPO | Attending: Hematology

## 2019-09-24 ENCOUNTER — Inpatient Hospital Stay: Payer: BC Managed Care – PPO | Admitting: Hematology

## 2019-09-24 ENCOUNTER — Inpatient Hospital Stay: Payer: BC Managed Care – PPO

## 2019-09-24 ENCOUNTER — Inpatient Hospital Stay (HOSPITAL_BASED_OUTPATIENT_CLINIC_OR_DEPARTMENT_OTHER): Payer: BC Managed Care – PPO | Admitting: Hematology

## 2019-09-24 ENCOUNTER — Other Ambulatory Visit: Payer: Self-pay

## 2019-09-24 VITALS — BP 140/73 | HR 78 | Temp 98.5°F | Resp 18 | Ht 68.0 in | Wt 164.0 lb

## 2019-09-24 DIAGNOSIS — C9010 Plasma cell leukemia not having achieved remission: Secondary | ICD-10-CM | POA: Insufficient documentation

## 2019-09-24 DIAGNOSIS — D649 Anemia, unspecified: Secondary | ICD-10-CM | POA: Insufficient documentation

## 2019-09-24 DIAGNOSIS — N289 Disorder of kidney and ureter, unspecified: Secondary | ICD-10-CM | POA: Diagnosis not present

## 2019-09-24 DIAGNOSIS — F17210 Nicotine dependence, cigarettes, uncomplicated: Secondary | ICD-10-CM | POA: Insufficient documentation

## 2019-09-24 DIAGNOSIS — N400 Enlarged prostate without lower urinary tract symptoms: Secondary | ICD-10-CM | POA: Diagnosis not present

## 2019-09-24 DIAGNOSIS — Z7189 Other specified counseling: Secondary | ICD-10-CM

## 2019-09-24 DIAGNOSIS — C9011 Plasma cell leukemia in remission: Secondary | ICD-10-CM

## 2019-09-24 DIAGNOSIS — Z5112 Encounter for antineoplastic immunotherapy: Secondary | ICD-10-CM | POA: Insufficient documentation

## 2019-09-24 DIAGNOSIS — Z836 Family history of other diseases of the respiratory system: Secondary | ICD-10-CM | POA: Insufficient documentation

## 2019-09-24 DIAGNOSIS — Z79899 Other long term (current) drug therapy: Secondary | ICD-10-CM | POA: Insufficient documentation

## 2019-09-24 DIAGNOSIS — Z800 Family history of malignant neoplasm of digestive organs: Secondary | ICD-10-CM | POA: Diagnosis not present

## 2019-09-24 DIAGNOSIS — Z7982 Long term (current) use of aspirin: Secondary | ICD-10-CM | POA: Diagnosis not present

## 2019-09-24 DIAGNOSIS — I10 Essential (primary) hypertension: Secondary | ICD-10-CM | POA: Insufficient documentation

## 2019-09-24 DIAGNOSIS — Z5111 Encounter for antineoplastic chemotherapy: Secondary | ICD-10-CM | POA: Diagnosis not present

## 2019-09-24 LAB — CBC WITH DIFFERENTIAL/PLATELET
Abs Immature Granulocytes: 0.02 10*3/uL (ref 0.00–0.07)
Basophils Absolute: 0.1 10*3/uL (ref 0.0–0.1)
Basophils Relative: 1 %
Eosinophils Absolute: 0.5 10*3/uL (ref 0.0–0.5)
Eosinophils Relative: 7 %
HCT: 36.2 % — ABNORMAL LOW (ref 39.0–52.0)
Hemoglobin: 12 g/dL — ABNORMAL LOW (ref 13.0–17.0)
Immature Granulocytes: 0 %
Lymphocytes Relative: 22 %
Lymphs Abs: 1.5 10*3/uL (ref 0.7–4.0)
MCH: 30.5 pg (ref 26.0–34.0)
MCHC: 33.1 g/dL (ref 30.0–36.0)
MCV: 92.1 fL (ref 80.0–100.0)
Monocytes Absolute: 0.7 10*3/uL (ref 0.1–1.0)
Monocytes Relative: 10 %
Neutro Abs: 4.1 10*3/uL (ref 1.7–7.7)
Neutrophils Relative %: 60 %
Platelets: 186 10*3/uL (ref 150–400)
RBC: 3.93 MIL/uL — ABNORMAL LOW (ref 4.22–5.81)
RDW: 14.1 % (ref 11.5–15.5)
WBC: 6.9 10*3/uL (ref 4.0–10.5)
nRBC: 0 % (ref 0.0–0.2)

## 2019-09-24 LAB — CMP (CANCER CENTER ONLY)
ALT: 12 U/L (ref 0–44)
AST: 10 U/L — ABNORMAL LOW (ref 15–41)
Albumin: 3.9 g/dL (ref 3.5–5.0)
Alkaline Phosphatase: 74 U/L (ref 38–126)
Anion gap: 9 (ref 5–15)
BUN: 22 mg/dL (ref 8–23)
CO2: 24 mmol/L (ref 22–32)
Calcium: 8.6 mg/dL — ABNORMAL LOW (ref 8.9–10.3)
Chloride: 109 mmol/L (ref 98–111)
Creatinine: 1.8 mg/dL — ABNORMAL HIGH (ref 0.61–1.24)
GFR, Est AFR Am: 46 mL/min — ABNORMAL LOW (ref >60)
GFR, Estimated: 39 mL/min — ABNORMAL LOW (ref >60)
Glucose, Bld: 84 mg/dL (ref 70–99)
Potassium: 4.4 mmol/L (ref 3.5–5.1)
Sodium: 142 mmol/L (ref 135–145)
Total Bilirubin: 0.4 mg/dL (ref 0.3–1.2)
Total Protein: 6.9 g/dL (ref 6.5–8.1)

## 2019-09-24 MED ORDER — SODIUM CHLORIDE 0.9 % IV SOLN
Freq: Once | INTRAVENOUS | Status: AC
  Filled 2019-09-24: qty 250

## 2019-09-24 MED ORDER — FAMOTIDINE 20 MG PO TABS
ORAL_TABLET | ORAL | Status: AC
  Filled 2019-09-24: qty 1

## 2019-09-24 MED ORDER — FAMOTIDINE 20 MG PO TABS
20.0000 mg | ORAL_TABLET | Freq: Once | ORAL | Status: AC
  Administered 2019-09-24: 20 mg via ORAL

## 2019-09-24 MED ORDER — SODIUM CHLORIDE 0.9 % IV SOLN
20.0000 mg | Freq: Once | INTRAVENOUS | Status: AC
  Administered 2019-09-24: 20 mg via INTRAVENOUS
  Filled 2019-09-24: qty 20

## 2019-09-24 MED ORDER — SODIUM CHLORIDE 0.9 % IV SOLN
INTRAVENOUS | Status: AC
  Filled 2019-09-24: qty 250

## 2019-09-24 MED ORDER — ACETAMINOPHEN 325 MG PO TABS
650.0000 mg | ORAL_TABLET | Freq: Once | ORAL | Status: AC
  Administered 2019-09-24: 650 mg via ORAL

## 2019-09-24 MED ORDER — DEXTROSE 5 % IV SOLN
53.0000 mg/m2 | Freq: Once | INTRAVENOUS | Status: AC
  Administered 2019-09-24: 100 mg via INTRAVENOUS
  Filled 2019-09-24: qty 5

## 2019-09-24 MED ORDER — ACETAMINOPHEN 325 MG PO TABS
ORAL_TABLET | ORAL | Status: AC
  Filled 2019-09-24: qty 2

## 2019-09-24 MED ORDER — SODIUM CHLORIDE 0.9 % IV SOLN
Freq: Once | INTRAVENOUS | Status: AC
  Administered 2019-09-24: 13:00:00 via INTRAVENOUS
  Filled 2019-09-24: qty 250

## 2019-09-24 NOTE — Patient Instructions (Signed)
[facility] Discharge Instructions for Patients Receiving Chemotherapy  Today you received the following chemotherapy agents: Carfilzomib (Kyprolis)  To help prevent nausea and vomiting after your treatment, we encourage you to take your nausea medication as directed.    If you develop nausea and vomiting that is not controlled by your nausea medication, call the clinic.   BELOW ARE SYMPTOMS THAT SHOULD BE REPORTED IMMEDIATELY:  *FEVER GREATER THAN 100.5 F  *CHILLS WITH OR WITHOUT FEVER  NAUSEA AND VOMITING THAT IS NOT CONTROLLED WITH YOUR NAUSEA MEDICATION  *UNUSUAL SHORTNESS OF BREATH  *UNUSUAL BRUISING OR BLEEDING  TENDERNESS IN MOUTH AND THROAT WITH OR WITHOUT PRESENCE OF ULCERS  *URINARY PROBLEMS  *BOWEL PROBLEMS  UNUSUAL RASH Items with * indicate a potential emergency and should be followed up as soon as possible.  Feel free to call the clinic should you have any questions or concerns. The clinic phone number is [phone].  Please show the CHEMO ALERT CARD at check-in to the Emergency Department and triage nurse.  Coronavirus (COVID-19) Are you at risk?  Are you at risk for the Coronavirus (COVID-19)?  To be considered HIGH RISK for Coronavirus (COVID-19), you have to meet the following criteria:  . Traveled to [place], [place], [name], [place] or [place]; or in the [place] to [place], [place], [place], or [place]; and have fever, cough, and shortness of breath within the last 2 weeks of travel OR . Been in close contact with a person diagnosed with COVID-19 within the last 2 weeks and have fever, cough, and shortness of breath . IF YOU DO NOT MEET THESE CRITERIA, YOU ARE CONSIDERED LOW RISK FOR COVID-19.  What to do if you are HIGH RISK for COVID-19?  . If you are having a medical emergency, call 911. . Seek medical care right away. Before you go to a doctor's office, urgent care or emergency department, call ahead and tell them  about your recent travel, contact with someone diagnosed with COVID-19, and your symptoms. You should receive instructions from your physician's office regarding next steps of care.  . When you arrive at healthcare provider, tell the healthcare staff immediately you have returned from visiting [place], [place], [place], [place] or [name]; or traveled in the [place] to [place], [place], [place], or [place]; in the last two weeks or you have been in close contact with a person diagnosed with COVID-19 in the last 2 weeks.   . Tell the health care staff about your symptoms: fever, cough and shortness of breath. . After you have been seen by a medical provider, you will be either: o Tested for (COVID-19) and discharged home on quarantine except to seek medical care if symptoms worsen, and asked to  - Stay home and avoid contact with others until you get your results (4-5 days)  - Avoid travel on public transportation if possible (such as bus, train, or airplane) or o Sent to the Emergency Department by EMS for evaluation, COVID-19 testing, and possible admission depending on your condition and test results.  What to do if you are LOW RISK for COVID-19?  Reduce your risk of any infection by using the same precautions used for avoiding the common cold or flu:  . Wash your hands often with soap and warm water for at least 20 seconds.  If soap and water are not readily available, use an alcohol-based hand sanitizer with at least 60% alcohol.  . If coughing   or sneezing, cover your mouth and nose by coughing or sneezing into the elbow areas of your shirt or coat, into a tissue or into your sleeve (not your hands). . Avoid shaking hands with others and consider head nods or verbal greetings only. . Avoid touching your eyes, nose, or mouth with unwashed hands.  . Avoid close contact with people who are sick. . Avoid places or events with large numbers of people in one location, like concerts or  sporting events. . Carefully consider travel plans you have or are making. . If you are planning any travel outside or inside the [place], visit the CDC's Travelers' Health webpage for the latest health notices. . If you have some symptoms but not all symptoms, continue to monitor at home and seek medical attention if your symptoms worsen. . If you are having a medical emergency, call 911.   ADDITIONAL HEALTHCARE OPTIONS FOR PATIENTS  [facility] / e-Visit: [email or web address]         [facility]: [phone]  [facility]: [phone]                   [facility]: [phone]   

## 2019-09-24 NOTE — Progress Notes (Signed)
Per Dr. Irene Limbo: OK to treat with creatinine 1.8

## 2019-09-24 NOTE — Progress Notes (Signed)
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 His labs today 12/14/2017 show hgb 8.3, platelets 524k. He is doing well overall. He has staples in his right upper leg following a recent surgery and has not been in touch with the surgeons for a f/u of this. He is not receiving physical therapy and is not getting around at home.  He notes that he was not scheduled for a post hospitalization nephrology f/u and has no PCP.  On review of systems, pt reports intermittent fatigue, SOB, pain to the left leg, dizziness, dry mouth and denies fever, chills, night sweats, dysuria and any other accompanying symptoms.     INTERVAL HISTORY:    Andres Fernandez presents today for follow up and continue management of his myeloma/plasma cell leukemia. 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 He is here for C2D15 of maintenance Carfilzomib. The patient's last visit with [name] was on 08/27/2019. The pt reports that he is doing well overall.  The pt reports that he is tolerating Carfilzomib well but his is experiencing dry skin on his chest. This area does not itch. Pt takes long, hot showers but he does moisturize his skin. Pt states that he has been eating and drinking well. He denies any issues taking Revlimid. There have been a few episodes of nausea that last about 5 minutes but they are not very bothersome to the pt and he still has anti-nausea medication. He is currently back at work and taking precautions to protect himself and distance himself from others. Pt is also resting when necessary at work. Pt will see Andres, Norma Fredrickson again in 2 months for his shots. He is interested in going to buffet restaurants.   Lab results today  (09/24/19) of CBC w/diff and CMP is as follows: all values are WNL except for RBC at 3.93, Hgb at 12.0, HCT at 36.2, Creatinine at 1.80, Calcium at 8.6, AST at 10, GFR Est Non Af Am at 39.  On review of systems, pt reports dry skin, occasional nausea and denies fevers, chills, infection issues, itching, diarrhea, vomiting, mouth sores, bone pain, back aches, new gum problems/sores, spine pain and any other symptoms.    REVIEW OF SYSTEMS:    A 10+ POINT REVIEW OF SYSTEMS WAS OBTAINED including neurology, dermatology, psychiatry, cardiac, respiratory, lymph, extremities, GI, GU, Musculoskeletal, constitutional, breasts, reproductive, HEENT.  All pertinent positives are noted in the HPI.  All others are negative.   MEDICAL HISTORY:  Past Medical History:  Diagnosis Date  . BPH (benign prostatic hyperplasia)   . Hepatitis C 11/27/2017  . Hypertension   . Plasma cell leukemia ([place]) 11/23/2017  . Tobacco abuse     SURGICAL HISTORY: Past Surgical History:  Procedure Laterality Date  . APPENDECTOMY    . FEMUR IM NAIL Right 11/20/2017   Procedure: RIGHT FEMORAL INTRAMEDULLARY (IM) NAIL;  Surgeon: Andres Stairs, MD;  Location: [facility];  Service: Orthopedics;  Laterality: Right;    SOCIAL HISTORY: Social History   Socioeconomic History  . Marital status: Single    Spouse name: Not on file  . Number of children: Not on file  . Years of education: Not on file  . Highest education level: Not on  file  Occupational History  . Not on file  Social Needs  . Financial resource strain: Not on file  . Food insecurity    Worry: Not on file    Inability: Not on file  . Transportation needs    Medical: Not on file    Non-medical: Not on file  Tobacco Use  . Smoking status: Current Every Day Smoker    Packs/day: 0.25    Types: Cigarettes    Last attempt to quit: 11/09/2017    Years since quitting: 1.8  . Smokeless tobacco: Never Used  . Tobacco comment: 1 cigarette/day as of 09/25/18   Substance and Sexual Activity  . Alcohol use: Yes  . Drug use: No  . Sexual activity: Not on file  Lifestyle  . Physical activity    Days per week: Not on file    Minutes per session: Not on file  . Stress: Not on file  Relationships  . Social Herbalist on phone: Not on file    Gets together: Not on file    Attends religious service: Not on file    Active member of club or organization: Not on file    Attends meetings of clubs or organizations: Not on file    Relationship status: Not on file  . Intimate partner violence    Fear of current or ex partner: Not on file    Emotionally abused: Not on file    Physically abused: Not on file    Forced sexual activity: Not on file  Other Topics Concern  . Not on file  Social History Narrative  . Not on file    FAMILY HISTORY: Family History  Problem Relation Age of Onset  . COPD Mother   . Liver cancer Mother     ALLERGIES:  has No Known Allergies.  MEDICATIONS:  . Current Outpatient Medications on File Prior to Visit  Medication Sig Dispense Refill  . acyclovir (ZOVIRAX) 400 MG tablet Take 1 tablet (400 mg total) by mouth daily. 30 tablet 11  . aspirin EC 81 MG tablet Take 1 tablet (81 mg total) by mouth daily. 60 tablet 11  . cholecalciferol (VITAMIN D) 400 units TABS tablet Take 400 Units by mouth daily.    Andres Fernandez dexamethasone (DECADRON) 4 MG tablet Take 5 tablets (20 mg total) by mouth once a week. (Patient not taking: Reported on 08/27/2019) 20 tablet 1  . ergocalciferol (VITAMIN D2) 1.25 MG (50000 UT) capsule Take 1 capsule (50,000 Units total) by mouth once a week. 12 capsule 6  . lenalidomide (REVLIMID) 10 MG capsule Take 1 capsule (10 mg total) by mouth daily. Take for 21 days on, 7 days off, repeat every 28 days. 21 capsule 0  . nicotine (NICODERM CQ - DOSED IN MG/24 HOURS) 14 mg/24hr patch PLACE 1 PATCH (14 MG TOTAL) ONTO THE SKIN DAILY. 28 patch 1  . ondansetron (ZOFRAN) 4 MG tablet Take 2 tablets (8 mg  total) by mouth every 8 (eight) hours as needed for nausea. 30 tablet 0   No current facility-administered medications on file prior to visit.     PHYSICAL EXAMINATION:  VS reviewed Vitals:   09/24/19 1044  BP: 140/73  Pulse: 78  Resp: 18  Temp: 98.5 F (36.9 C)  SpO2: 100%     GENERAL:alert, in no acute distress and comfortable SKIN: no acute rashes, no significant lesions EYES: conjunctiva are pink and non-injected, sclera anicteric OROPHARYNX: MMM, no exudates, no oropharyngeal erythema  or ulceration NECK: supple, no JVD LYMPH:  no palpable lymphadenopathy in the cervical, axillary or inguinal regions LUNGS: clear to auscultation b/l with normal respiratory effort HEART: regular rate & rhythm ABDOMEN:  normoactive bowel sounds , non tender, not distended. No palpable hepatosplenomegaly.  Extremity: no pedal edema PSYCH: alert & oriented x 3 with fluent speech NEURO: no focal motor/sensory deficits  LABORATORY DATA:  I have reviewed the data as listed  . CBC Latest Ref Rng & Units 09/24/2019 09/10/2019 08/27/2019  WBC 4.0 - 10.5 K/uL 6.9 7.2 8.8  Hemoglobin 13.0 - 17.0 g/dL 12.0(L) 12.7(L) 11.9(L)  Hematocrit 39.0 - 52.0 % 36.2(L) 37.2(L) 35.9(L)  Platelets 150 - 400 K/uL 186 250 203   . CBC    Component Value Date/Time   WBC 6.9 09/24/2019 1014   RBC 3.93 (L) 09/24/2019 1014   HGB 12.0 (L) 09/24/2019 1014   HGB 11.9 (L) 08/27/2019 0922   HGB 8.3 (L) 12/14/2017 1230   HCT 36.2 (L) 09/24/2019 1014   HCT 23.9 (L) 12/14/2017 1230   PLT 186 09/24/2019 1014   PLT 203 08/27/2019 0922   PLT 524 (H) 12/14/2017 1230   MCV 92.1 09/24/2019 1014   MCV 88.4 12/14/2017 1230   MCH 30.5 09/24/2019 1014   MCHC 33.1 09/24/2019 1014   RDW 14.1 09/24/2019 1014   RDW 15.3 (H) 12/14/2017 1230   LYMPHSABS 1.5 09/24/2019 1014   LYMPHSABS 1.4 12/14/2017 1230   MONOABS 0.7 09/24/2019 1014   MONOABS 0.6 12/14/2017 1230   EOSABS 0.5 09/24/2019 1014   EOSABS 0.1 12/14/2017 1230    BASOSABS 0.1 09/24/2019 1014   BASOSABS 0.1 12/14/2017 1230     . CMP Latest Ref Rng & Units 09/24/2019 09/10/2019 08/27/2019  Glucose 70 - 99 mg/dL 84 95 77  BUN 8 - 23 mg/dL 22 25(H) 28(H)  Creatinine 0.61 - 1.24 mg/dL 1.80(H) 1.68(H) 1.78(H)  Sodium 135 - 145 mmol/L 142 140 140  Potassium 3.5 - 5.1 mmol/L 4.4 4.3 4.2  Chloride 98 - 111 mmol/L 109 111 109  CO2 22 - 32 mmol/L 24 18(L) 19(L)  Calcium 8.9 - 10.3 mg/dL 8.6(L) 8.7(L) 8.7(L)  Total Protein 6.5 - 8.1 g/dL 6.9 7.1 7.0  Total Bilirubin 0.3 - 1.2 mg/dL 0.4 0.4 0.3  Alkaline Phos 38 - 126 U/L 74 80 84  AST 15 - 41 U/L 10(L) 12(L) 10(L)  ALT 0 - 44 U/L 12 14 11    07/02/2019 Bone Marrow Biopsy ([id number]) at [facility]    08/07/18 BM Bx:       RADIOGRAPHIC STUDIES: I have personally reviewed the radiological images as listed and agreed with the findings in the report. No results found.  ASSESSMENT & PLAN:   63 y.o. 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 Appears to be responding to treatment.- with decreased kappa light chains.   Initial BM Bx showed >95% involvement with kappa restricted serum free light chains. 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 MoL Cy-   08/07/18 BM Bx revealed normocellular bone marrow with trilineage hematopoiesis and 1% plasma cells which indicates the pt is in remission 08/06/18 PET/CT revealed  Expansile lytic lesion involving the left iliac bone, unchanged. Lytic lesion involving the sternum, unchanged. While both are  suspicious for myeloma, neither lesion is FDG avid. No hypermetabolic osseous lesions in the visualized axial and appendicular skeleton. Please note that the bilateral lower  extremities were not imaged. No suspicious lymphadenopathy. Spleen is normal in size.    Pt began Ninlaro 14m maintenance after pt finished 13 cycles of CyBorD and BM bx showed only 1% plasma cells, then resumed CyBorD and held Ninlaro in anticipation of his tandem bone marrow transplants beginning in late December 2019 [facility]  11/19/18 High dose chemotherapy with Melphalan 1415mm2 and autologous stem cell rescue  03/20/19 High dose chemotherapy with Melphalan 20058m2 and autologous stem cell rescue. Non-infectious diarrhea, uncomplicated course otherwise.  07/02/2019 PET scan with results revealing "1. No FDG avid bone lesions are identified. 2.  Asymmetric uptake within the right, greater than left, palatine tonsils with mild fullness of the right tonsillar soft tissue. ENT consult vs attention on follow up suggested. 3.  Innumerable mixed sclerotic and lytic lesions throughout the axial and appendicular skeleton without associated hypermetabolic activity. 4.  Ancillary CT findings as above."  07/02/2019 bone marrow biopsy ([id number])with results showing: "Trilineage hematopoieses with maturation. No plasma cell myeloma identified. Mild anemia and No Rouleaux or circulating plasma cells identified in the peripheral blood."  2) s/p Prophylactic IM nailing for rt femur  [facility] previously reported improvement and is currently progressively back to full time work.  3) Thrombocytopenia resolved   4) Renal insufficiency -- likely primarily related to Myeloma multifactorial MM/TLS/sepsis/antibiotics.Primary light chain nephrology Plan -maintain follow up with nephrology referral to Andres Fernandez for continued treatment -Pt completed 8 weeks of maverick for hep c with Andres Fernandez 07/08/18.  -Kidney numbers improved. 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 -Hgb improved to 11.3   -monitor   [facility]"Recommendations of care for local oncologist:  Dental evaluation twice a year to prevent increased risk of cavities and gum retraction is recommended. It is recommended that the dentist apply fluoride on one of the visits each year.   An annual evaluation with an eye specialist is important to check for possible cataracts formed from all the steroids used with treatment.   A bone density scan every 2 years will monitor for risk of osteoporosis. One of the most important things to focus on is exercise and healthy eating. Joining a program like the [employer] the [employer] can help regain the strength in the legs that will give a stirdier walk and prevent falls. It is important to do an aerobic exercise suitable for the degree of neurophathy and weakness. Water exercises or recumbent bicycles may be good options. Exercise will also help with neuropathy and reduce the risk of cardiovascular complications from transplant.   Maintenance therapy should be started with lenalidomide 10 mg daily, 21 of 28 days and carfilzomib on days 1 and 15.  Monthly labs with CBC, CMP, SPEP, UPEP, quantitative immunoglobulins, and serum free light chains are recommended for the remainder of the first year post-transplant.  Vaccinations will be started after the 6 month visit with us.[place] now and the next post-transplant visit, the patient should continue care with  a PCP and remain on antiviral prophylactic antibiotics for a whole year. Bactrim or Dapsone should be continued until 6 months post-transplant. Acyclovir will be given for a total of 1 year from day of transplant. If the patient or their doctor suspects a treatment  related event they should call right away. We will continue to see Andres Fernandez every 3 months to monitor progress for the first year."   PLAN: -Discussed pt labwork today, 09/24/19; blood counts look good, blood chemistries are stable -Advised pt to take shorter, cooler showers & properly moisturize skin  -Recommended Sarna for dry skin -Advised pt again of infection prevention strategies in the current flu season and pandemic  -Advised pt of the advantages of a port placement - pt will let us know if he would like one -Pt has no prohibitive toxicities from the C2D15 of Carfilzomib and Revlimid. -Will continue current therapy until progression or toxicities. -Continue enteric coated baby Asprin 81 mg -Will see back in 6 weeks with labs    FOLLOW UP: -Please schedule next 2 cycles of Carfilzomib with labs each time -Continue Pamidronate q12weeks  -RTC with Andres Fernandez in 6 weeks with C4D1  The total time spent in the appt was 25 minutes and more than 50% was on counseling and direct patient cares.  All of the patient's questions were answered with apparent satisfaction. The patient knows to call the clinic with any problems, questions or concerns.  Andres Lone MD [place] AAHIVMS [facility] Hematology/Oncology Physician [facility]  (Office):       [phone] (Work cell):  [phone] (Fax):           [phone]  I, Andres Fernandez, am acting as a scribe for Andres. Sullivan Fernandez.   .I have reviewed the above documentation for accuracy and completeness, and I agree with the above. Brunetta Genera MD

## 2019-10-01 ENCOUNTER — Telehealth: Payer: Self-pay | Admitting: Hematology

## 2019-10-01 NOTE — Telephone Encounter (Signed)
Returned patient's phone call regarding rescheduling an appointment for 10/28, informed patient time patient requested is unavailable.

## 2019-10-02 ENCOUNTER — Other Ambulatory Visit: Payer: Self-pay | Admitting: *Deleted

## 2019-10-02 DIAGNOSIS — C9010 Plasma cell leukemia not having achieved remission: Secondary | ICD-10-CM

## 2019-10-02 MED ORDER — LENALIDOMIDE 10 MG PO CAPS: 10.0000 mg | ORAL_CAPSULE | Freq: Every day | ORAL | 0 refills | Status: DC

## 2019-10-02 NOTE — Telephone Encounter (Signed)
Refill per Dr.Kale OV note 09/25/2019 Refill escribed to [facility] by Westley Gambles, [place] - [id number] [employer] [facility]# [id number], 10/02/2019

## 2019-10-08 ENCOUNTER — Other Ambulatory Visit: Payer: Self-pay

## 2019-10-08 ENCOUNTER — Inpatient Hospital Stay: Payer: BC Managed Care – PPO

## 2019-10-08 VITALS — BP 116/62 | HR 79 | Temp 98.5°F | Resp 16

## 2019-10-08 DIAGNOSIS — C9010 Plasma cell leukemia not having achieved remission: Secondary | ICD-10-CM

## 2019-10-08 DIAGNOSIS — C9011 Plasma cell leukemia in remission: Secondary | ICD-10-CM

## 2019-10-08 DIAGNOSIS — Z7189 Other specified counseling: Secondary | ICD-10-CM

## 2019-10-08 DIAGNOSIS — Z5111 Encounter for antineoplastic chemotherapy: Secondary | ICD-10-CM

## 2019-10-08 LAB — CBC WITH DIFFERENTIAL/PLATELET
Abs Immature Granulocytes: 0.02 10*3/uL (ref 0.00–0.07)
Basophils Absolute: 0.1 10*3/uL (ref 0.0–0.1)
Basophils Relative: 2 %
Eosinophils Absolute: 0.3 10*3/uL (ref 0.0–0.5)
Eosinophils Relative: 5 %
HCT: 32.9 % — ABNORMAL LOW (ref 39.0–52.0)
Hemoglobin: 11.2 g/dL — ABNORMAL LOW (ref 13.0–17.0)
Immature Granulocytes: 0 %
Lymphocytes Relative: 25 %
Lymphs Abs: 1.6 10*3/uL (ref 0.7–4.0)
MCH: 30.5 pg (ref 26.0–34.0)
MCHC: 34 g/dL (ref 30.0–36.0)
MCV: 89.6 fL (ref 80.0–100.0)
Monocytes Absolute: 0.6 10*3/uL (ref 0.1–1.0)
Monocytes Relative: 9 %
Neutro Abs: 3.7 10*3/uL (ref 1.7–7.7)
Neutrophils Relative %: 59 %
Platelets: 181 10*3/uL (ref 150–400)
RBC: 3.67 MIL/uL — ABNORMAL LOW (ref 4.22–5.81)
RDW: 14.6 % (ref 11.5–15.5)
WBC: 6.3 10*3/uL (ref 4.0–10.5)
nRBC: 0 % (ref 0.0–0.2)

## 2019-10-08 LAB — CMP (CANCER CENTER ONLY)
ALT: 13 U/L (ref 0–44)
AST: 11 U/L — ABNORMAL LOW (ref 15–41)
Albumin: 3.9 g/dL (ref 3.5–5.0)
Alkaline Phosphatase: 70 U/L (ref 38–126)
Anion gap: 13 (ref 5–15)
BUN: 34 mg/dL — ABNORMAL HIGH (ref 8–23)
CO2: 18 mmol/L — ABNORMAL LOW (ref 22–32)
Calcium: 8.7 mg/dL — ABNORMAL LOW (ref 8.9–10.3)
Chloride: 111 mmol/L (ref 98–111)
Creatinine: 1.84 mg/dL — ABNORMAL HIGH (ref 0.61–1.24)
GFR, Est AFR Am: 45 mL/min — ABNORMAL LOW (ref >60)
GFR, Estimated: 38 mL/min — ABNORMAL LOW (ref >60)
Glucose, Bld: 82 mg/dL (ref 70–99)
Potassium: 4.2 mmol/L (ref 3.5–5.1)
Sodium: 142 mmol/L (ref 135–145)
Total Bilirubin: 0.3 mg/dL (ref 0.3–1.2)
Total Protein: 6.7 g/dL (ref 6.5–8.1)

## 2019-10-08 MED ORDER — FAMOTIDINE 20 MG PO TABS
ORAL_TABLET | ORAL | Status: AC
  Filled 2019-10-08: qty 1

## 2019-10-08 MED ORDER — SODIUM CHLORIDE 0.9 % IV SOLN
INTRAVENOUS | Status: AC
  Filled 2019-10-08: qty 250

## 2019-10-08 MED ORDER — ACETAMINOPHEN 325 MG PO TABS
ORAL_TABLET | ORAL | Status: AC
  Filled 2019-10-08: qty 2

## 2019-10-08 MED ORDER — SODIUM CHLORIDE 0.9 % IV SOLN
Freq: Once | INTRAVENOUS | Status: AC
  Administered 2019-10-08: 14:00:00 via INTRAVENOUS
  Filled 2019-10-08: qty 250

## 2019-10-08 MED ORDER — ACETAMINOPHEN 325 MG PO TABS
650.0000 mg | ORAL_TABLET | Freq: Once | ORAL | Status: AC
  Administered 2019-10-08: 650 mg via ORAL

## 2019-10-08 MED ORDER — SODIUM CHLORIDE 0.9 % IV SOLN
20.0000 mg | Freq: Once | INTRAVENOUS | Status: AC
  Administered 2019-10-08: 20 mg via INTRAVENOUS
  Filled 2019-10-08: qty 20

## 2019-10-08 MED ORDER — DEXTROSE 5 % IV SOLN
54.0000 mg/m2 | Freq: Once | INTRAVENOUS | Status: AC
  Administered 2019-10-08: 100 mg via INTRAVENOUS
  Filled 2019-10-08: qty 5

## 2019-10-08 MED ORDER — FAMOTIDINE 20 MG PO TABS
20.0000 mg | ORAL_TABLET | Freq: Once | ORAL | Status: AC
  Administered 2019-10-08: 20 mg via ORAL

## 2019-10-08 NOTE — Patient Instructions (Incomplete)
[facility] Discharge Instructions for Patients Receiving Chemotherapy  Today you received the following chemotherapy agents :  carfilzomib (Kyprolis)  To help prevent nausea and vomiting after your treatment, we encourage you to take your nausea medication as prescribed.   If you develop nausea and vomiting that is not controlled by your nausea medication, call the clinic.   BELOW ARE SYMPTOMS THAT SHOULD BE REPORTED IMMEDIATELY:  *FEVER GREATER THAN 100.5 F  *CHILLS WITH OR WITHOUT FEVER  NAUSEA AND VOMITING THAT IS NOT CONTROLLED WITH YOUR NAUSEA MEDICATION  *UNUSUAL SHORTNESS OF BREATH  *UNUSUAL BRUISING OR BLEEDING  TENDERNESS IN MOUTH AND THROAT WITH OR WITHOUT PRESENCE OF ULCERS  *URINARY PROBLEMS  *BOWEL PROBLEMS  UNUSUAL RASH Items with * indicate a potential emergency and should be followed up as soon as possible.  Feel free to call the clinic should you have any questions or concerns. The clinic phone number is [phone].  Please show the CHEMO ALERT CARD at check-in to the Emergency Department and triage nurse.   

## 2019-10-08 NOTE — Progress Notes (Signed)
Ok to treat with Scr 1.84 per Dr. Irene Limbo.

## 2019-10-22 ENCOUNTER — Other Ambulatory Visit: Payer: Self-pay

## 2019-10-22 ENCOUNTER — Inpatient Hospital Stay: Payer: BC Managed Care – PPO | Attending: Hematology

## 2019-10-22 ENCOUNTER — Inpatient Hospital Stay: Payer: BC Managed Care – PPO

## 2019-10-22 VITALS — BP 132/80 | HR 80 | Temp 98.5°F | Resp 16 | Wt 159.2 lb

## 2019-10-22 DIAGNOSIS — Z7189 Other specified counseling: Secondary | ICD-10-CM

## 2019-10-22 DIAGNOSIS — M84551A Pathological fracture in neoplastic disease, right femur, initial encounter for fracture: Secondary | ICD-10-CM

## 2019-10-22 DIAGNOSIS — C9010 Plasma cell leukemia not having achieved remission: Secondary | ICD-10-CM

## 2019-10-22 DIAGNOSIS — Z5112 Encounter for antineoplastic immunotherapy: Secondary | ICD-10-CM | POA: Insufficient documentation

## 2019-10-22 DIAGNOSIS — Z5111 Encounter for antineoplastic chemotherapy: Secondary | ICD-10-CM

## 2019-10-22 DIAGNOSIS — C9011 Plasma cell leukemia in remission: Secondary | ICD-10-CM

## 2019-10-22 LAB — CBC WITH DIFFERENTIAL/PLATELET
Abs Immature Granulocytes: 0.02 10*3/uL (ref 0.00–0.07)
Basophils Absolute: 0.1 10*3/uL (ref 0.0–0.1)
Basophils Relative: 1 %
Eosinophils Absolute: 0.1 10*3/uL (ref 0.0–0.5)
Eosinophils Relative: 2 %
HCT: 33.3 % — ABNORMAL LOW (ref 39.0–52.0)
Hemoglobin: 11.5 g/dL — ABNORMAL LOW (ref 13.0–17.0)
Immature Granulocytes: 0 %
Lymphocytes Relative: 25 %
Lymphs Abs: 1.4 10*3/uL (ref 0.7–4.0)
MCH: 31.2 pg (ref 26.0–34.0)
MCHC: 34.5 g/dL (ref 30.0–36.0)
MCV: 90.2 fL (ref 80.0–100.0)
Monocytes Absolute: 0.4 10*3/uL (ref 0.1–1.0)
Monocytes Relative: 8 %
Neutro Abs: 3.6 10*3/uL (ref 1.7–7.7)
Neutrophils Relative %: 64 %
Platelets: 159 10*3/uL (ref 150–400)
RBC: 3.69 MIL/uL — ABNORMAL LOW (ref 4.22–5.81)
RDW: 16 % — ABNORMAL HIGH (ref 11.5–15.5)
WBC: 5.6 10*3/uL (ref 4.0–10.5)
nRBC: 0 % (ref 0.0–0.2)

## 2019-10-22 LAB — CMP (CANCER CENTER ONLY)
ALT: 13 U/L (ref 0–44)
AST: 11 U/L — ABNORMAL LOW (ref 15–41)
Albumin: 4 g/dL (ref 3.5–5.0)
Alkaline Phosphatase: 73 U/L (ref 38–126)
Anion gap: 12 (ref 5–15)
BUN: 27 mg/dL — ABNORMAL HIGH (ref 8–23)
CO2: 18 mmol/L — ABNORMAL LOW (ref 22–32)
Calcium: 9.2 mg/dL (ref 8.9–10.3)
Chloride: 112 mmol/L — ABNORMAL HIGH (ref 98–111)
Creatinine: 1.77 mg/dL — ABNORMAL HIGH (ref 0.61–1.24)
GFR, Est AFR Am: 47 mL/min — ABNORMAL LOW (ref >60)
GFR, Estimated: 40 mL/min — ABNORMAL LOW (ref >60)
Glucose, Bld: 96 mg/dL (ref 70–99)
Potassium: 4.2 mmol/L (ref 3.5–5.1)
Sodium: 142 mmol/L (ref 135–145)
Total Bilirubin: 0.4 mg/dL (ref 0.3–1.2)
Total Protein: 6.7 g/dL (ref 6.5–8.1)

## 2019-10-22 MED ORDER — SODIUM CHLORIDE 0.9 % IV SOLN
60.0000 mg | Freq: Once | INTRAVENOUS | Status: AC
  Administered 2019-10-22: 11:00:00 60 mg via INTRAVENOUS
  Filled 2019-10-22: qty 10

## 2019-10-22 MED ORDER — SODIUM CHLORIDE 0.9 % IV SOLN
Freq: Once | INTRAVENOUS | Status: AC
  Administered 2019-10-22: 11:00:00 via INTRAVENOUS
  Filled 2019-10-22: qty 250

## 2019-10-22 MED ORDER — FAMOTIDINE 20 MG PO TABS
ORAL_TABLET | ORAL | Status: AC
  Filled 2019-10-22: qty 1

## 2019-10-22 MED ORDER — DEXTROSE 5 % IV SOLN
54.0000 mg/m2 | Freq: Once | INTRAVENOUS | Status: AC
  Administered 2019-10-22: 12:00:00 100 mg via INTRAVENOUS
  Filled 2019-10-22: qty 15

## 2019-10-22 MED ORDER — ACETAMINOPHEN 325 MG PO TABS
650.0000 mg | ORAL_TABLET | Freq: Once | ORAL | Status: AC
  Administered 2019-10-22: 11:00:00 650 mg via ORAL

## 2019-10-22 MED ORDER — SODIUM CHLORIDE 0.9 % IV SOLN
Freq: Once | INTRAVENOUS | Status: AC
  Administered 2019-10-22: 10:00:00 via INTRAVENOUS
  Filled 2019-10-22: qty 250

## 2019-10-22 MED ORDER — SODIUM CHLORIDE 0.9 % IV SOLN
20.0000 mg | Freq: Once | INTRAVENOUS | Status: AC
  Administered 2019-10-22: 11:00:00 20 mg via INTRAVENOUS
  Filled 2019-10-22: qty 20

## 2019-10-22 MED ORDER — FAMOTIDINE 20 MG PO TABS
20.0000 mg | ORAL_TABLET | Freq: Once | ORAL | Status: AC
  Administered 2019-10-22: 11:00:00 20 mg via ORAL

## 2019-10-22 MED ORDER — ACETAMINOPHEN 325 MG PO TABS
ORAL_TABLET | ORAL | Status: AC
  Filled 2019-10-22: qty 2

## 2019-10-22 NOTE — Patient Instructions (Addendum)
[facility] Discharge Instructions for Patients Receiving Chemotherapy  Today you received the following chemotherapy agents: Kyprolis  To help prevent nausea and vomiting after your treatment, we encourage you to take your nausea medication as directed.   If you develop nausea and vomiting that is not controlled by your nausea medication, call the clinic.   BELOW ARE SYMPTOMS THAT SHOULD BE REPORTED IMMEDIATELY:  *FEVER GREATER THAN 100.5 F  *CHILLS WITH OR WITHOUT FEVER  NAUSEA AND VOMITING THAT IS NOT CONTROLLED WITH YOUR NAUSEA MEDICATION  *UNUSUAL SHORTNESS OF BREATH  *UNUSUAL BRUISING OR BLEEDING  TENDERNESS IN MOUTH AND THROAT WITH OR WITHOUT PRESENCE OF ULCERS  *URINARY PROBLEMS  *BOWEL PROBLEMS  UNUSUAL RASH Items with * indicate a potential emergency and should be followed up as soon as possible.  Feel free to call the clinic should you have any questions or concerns. The clinic phone number is [phone].  Please show the CHEMO ALERT CARD at check-in to the Emergency Department and triage nurse.  Pamidronate injection What is this medicine? PAMIDRONATE (pa mi DROE nate) slows calcium loss from bones. It is used to treat high calcium blood levels from cancer or Paget's disease. It is also used to treat bone pain and prevent fractures from certain cancers that have spread to the bone. This medicine may be used for other purposes; ask your health care provider or pharmacist if you have questions. COMMON BRAND NAME(S): Aredia What should I tell my health care provider before I take this medicine? They need to know if you have any of these conditions:  aspirin-sensitive asthma  dental disease  kidney disease  an unusual or allergic reaction to pamidronate, other medicines, foods, dyes, or preservatives  pregnant or trying to get pregnant  breast-feeding How should I use this medicine? This medicine is for infusion into a vein. It is given  by a health care professional in a hospital or clinic setting. Talk to your pediatrician regarding the use of this medicine in children. This medicine is not approved for use in children. Overdosage: If you think you have taken too much of this medicine contact a poison control center or emergency room at once. NOTE: This medicine is only for you. Do not share this medicine with others. What if I miss a dose? This does not apply. What may interact with this medicine?  certain antibiotics given by injection  medicines for inflammation or pain like ibuprofen, naproxen  some diuretics like bumetanide, furosemide  cyclosporine  parathyroid hormone  tacrolimus  teriparatide  thalidomide This list may not describe all possible interactions. Give your health care provider a list of all the medicines, herbs, non-prescription drugs, or dietary supplements you use. Also tell them if you smoke, drink alcohol, or use illegal drugs. Some items may interact with your medicine. What should I watch for while using this medicine? Visit your doctor or health care professional for regular checkups. It may be some time before you see the benefit from this medicine. Do not stop taking your medicine unless your doctor tells you to. Your doctor may order blood tests or other tests to see how you are doing. Women should inform their doctor if they wish to become pregnant or think they might be pregnant. There is a potential for serious side effects to an unborn child. Talk to your health care professional or pharmacist for more information. You should make sure that you get enough calcium and vitamin D while you are   taking this medicine. Discuss the foods you eat and the vitamins you take with your health care professional. Some people who take this medicine have severe bone, joint, and/or muscle pain. This medicine may also increase your risk for a broken thigh bone. Tell your doctor right away if you have pain  in your upper leg or groin. Tell your doctor if you have any pain that does not go away or that gets worse. What side effects may I notice from receiving this medicine? Side effects that you should report to your doctor or health care professional as soon as possible:  allergic reactions like skin rash, itching or hives, swelling of the face, lips, or tongue  black or tarry stools  changes in vision  eye inflammation, pain  high blood pressure  jaw pain, especially burning or cramping  muscle weakness  numb, tingling pain  swelling of feet or hands  trouble passing urine or change in the amount of urine  unable to move easily Side effects that usually do not require medical attention (report to your doctor or health care professional if they continue or are bothersome):  bone, joint, or muscle pain  constipation  dizzy, drowsy  fever  headache  loss of appetite  nausea, vomiting  pain at site where injected This list may not describe all possible side effects. Call your doctor for medical advice about side effects. You may report side effects to FDA at 1-800-FDA-1088. Where should I keep my medicine? This drug is given in a hospital or clinic and will not be stored at home. NOTE: This sheet is a summary. It may not cover all possible information. If you have questions about this medicine, talk to your doctor, pharmacist, or health care provider.  2020 Elsevier/Gold Standard (2011-05-26 08:49:49)

## 2019-10-22 NOTE — Progress Notes (Signed)
Ok to treat with Creatinine level today per MD Irene Limbo and for IVF orders - since patient reports good po intake, only give normal 250 bolus and then KVO fluids today.

## 2019-10-29 ENCOUNTER — Telehealth: Payer: Self-pay | Admitting: *Deleted

## 2019-10-29 NOTE — Telephone Encounter (Signed)
Patient called - has cough and sore throat. Started yesterday and felt worse overnight. Denies temp or productive cough - states it is dry. Patient coughing during phone call. Informed Dr.Kale. Dr. Irene Limbo recommends patient have test for Covid 19 due to his receiving treatment. Patient verbalized understanding.  Reviewed testing locations with patient : [facility] open 10am until 3pm (no appt) and [facility] Dept open 3-7 (with appt-must call or register online). Patient states he will go either this afternoon or in the morning.  Asked patient to notify office of results.

## 2019-10-30 ENCOUNTER — Telehealth: Payer: Self-pay | Admitting: *Deleted

## 2019-10-30 ENCOUNTER — Other Ambulatory Visit: Payer: Self-pay

## 2019-10-30 ENCOUNTER — Telehealth: Payer: Self-pay | Admitting: Hematology

## 2019-10-30 DIAGNOSIS — Z20822 Contact with and (suspected) exposure to covid-19: Secondary | ICD-10-CM

## 2019-10-30 NOTE — Telephone Encounter (Signed)
[place] [facility] 11/25 f/u cxd, patient will have lab/tx only. Added f/u to 12/9 lab/tx per GK. Confirmed with patient.

## 2019-10-30 NOTE — Telephone Encounter (Signed)
Patient called for results ,advised they are still ending and to call back.

## 2019-11-02 LAB — NOVEL CORONAVIRUS, NAA: SARS-CoV-2, NAA: DETECTED — AB

## 2019-11-03 ENCOUNTER — Telehealth: Payer: Self-pay

## 2019-11-03 NOTE — Telephone Encounter (Signed)
Pt given Covid-19 positive results. Discussed mild, moderate and severe symptoms. Advised pt to call 911 for any respiratory issues and/dehydration. Discussed non test criteria for ending self isolation. Pt advised of way to manage symptoms at home and review isolation precautions especially the importance of washing hands frequently and wearing a mask when around others. Pt verbalized understanding. Will report to HD. 

## 2019-11-04 ENCOUNTER — Other Ambulatory Visit: Payer: Self-pay | Admitting: Hematology

## 2019-11-04 ENCOUNTER — Telehealth: Payer: Self-pay | Admitting: *Deleted

## 2019-11-04 NOTE — Telephone Encounter (Signed)
Late entry for 11/03/2019: Patient called to notify Dr.Kale office that he received results today that his Covid 19 test was positive. Patient states he doesn't feel too bad at this point. Encouraged to seek emergency medical care for shortness of breath or prolonged fever or chest pain. He verbalized understanding.  Informed patient that following current protocol at [facility], he will not be able to return for 21 days - which is 12/14 or after and symptom free. Patient has appts on 11/25 and 12/9. Will cancel appts for lab/ infusion on 11/25 and seek MD guidance on rescheduling.  Patient verbalized understanding.

## 2019-11-05 ENCOUNTER — Inpatient Hospital Stay: Payer: BC Managed Care – PPO | Admitting: Hematology

## 2019-11-05 ENCOUNTER — Inpatient Hospital Stay: Payer: BC Managed Care – PPO

## 2019-11-17 ENCOUNTER — Telehealth: Payer: Self-pay | Admitting: *Deleted

## 2019-11-17 NOTE — Telephone Encounter (Signed)
Patient called - [facility] Department informed patient that his Covid quarantine is up tomorrow 12/8. States he feels ok - has not felt bad after first 2-3 days.  Dr. Irene Limbo informed. Patient had Covid + test on 11/23. Per [facility] Guidelines, will need to wait 21 days to come back to [facility] - 12/14 or after. Appts for 12/9 cancelled and schedule message sent to r/s for 12/14. Patient verbalized understanding.

## 2019-11-19 ENCOUNTER — Inpatient Hospital Stay: Payer: Self-pay | Admitting: Hematology

## 2019-11-19 ENCOUNTER — Inpatient Hospital Stay: Payer: Self-pay

## 2019-11-19 ENCOUNTER — Telehealth: Payer: Self-pay | Admitting: Hematology

## 2019-11-19 NOTE — Telephone Encounter (Signed)
Scheduled appt per 1/27 sch message - pt aware of appt date and time   

## 2019-11-28 ENCOUNTER — Inpatient Hospital Stay: Payer: Self-pay | Attending: Hematology

## 2019-11-28 ENCOUNTER — Inpatient Hospital Stay (HOSPITAL_BASED_OUTPATIENT_CLINIC_OR_DEPARTMENT_OTHER): Payer: Self-pay | Admitting: Hematology

## 2019-11-28 ENCOUNTER — Other Ambulatory Visit: Payer: Self-pay

## 2019-11-28 ENCOUNTER — Inpatient Hospital Stay: Payer: Self-pay

## 2019-11-28 VITALS — BP 120/75 | HR 88 | Temp 98.9°F | Resp 18 | Ht 68.0 in | Wt 164.5 lb

## 2019-11-28 DIAGNOSIS — N400 Enlarged prostate without lower urinary tract symptoms: Secondary | ICD-10-CM | POA: Insufficient documentation

## 2019-11-28 DIAGNOSIS — Z5111 Encounter for antineoplastic chemotherapy: Secondary | ICD-10-CM

## 2019-11-28 DIAGNOSIS — C9011 Plasma cell leukemia in remission: Secondary | ICD-10-CM

## 2019-11-28 DIAGNOSIS — F17210 Nicotine dependence, cigarettes, uncomplicated: Secondary | ICD-10-CM | POA: Insufficient documentation

## 2019-11-28 DIAGNOSIS — N19 Unspecified kidney failure: Secondary | ICD-10-CM | POA: Insufficient documentation

## 2019-11-28 DIAGNOSIS — Z800 Family history of malignant neoplasm of digestive organs: Secondary | ICD-10-CM | POA: Insufficient documentation

## 2019-11-28 DIAGNOSIS — Z7982 Long term (current) use of aspirin: Secondary | ICD-10-CM | POA: Insufficient documentation

## 2019-11-28 DIAGNOSIS — I10 Essential (primary) hypertension: Secondary | ICD-10-CM | POA: Insufficient documentation

## 2019-11-28 DIAGNOSIS — Z79899 Other long term (current) drug therapy: Secondary | ICD-10-CM | POA: Insufficient documentation

## 2019-11-28 DIAGNOSIS — D649 Anemia, unspecified: Secondary | ICD-10-CM | POA: Insufficient documentation

## 2019-11-28 DIAGNOSIS — C9010 Plasma cell leukemia not having achieved remission: Secondary | ICD-10-CM | POA: Insufficient documentation

## 2019-11-28 LAB — CMP (CANCER CENTER ONLY)
ALT: 8 U/L (ref 0–44)
AST: 9 U/L — ABNORMAL LOW (ref 15–41)
Albumin: 3.7 g/dL (ref 3.5–5.0)
Alkaline Phosphatase: 87 U/L (ref 38–126)
Anion gap: 14 (ref 5–15)
BUN: 24 mg/dL — ABNORMAL HIGH (ref 8–23)
CO2: 19 mmol/L — ABNORMAL LOW (ref 22–32)
Calcium: 9 mg/dL (ref 8.9–10.3)
Chloride: 109 mmol/L (ref 98–111)
Creatinine: 1.75 mg/dL — ABNORMAL HIGH (ref 0.61–1.24)
GFR, Est AFR Am: 47 mL/min — ABNORMAL LOW (ref >60)
GFR, Estimated: 41 mL/min — ABNORMAL LOW (ref >60)
Glucose, Bld: 96 mg/dL (ref 70–99)
Potassium: 4.6 mmol/L (ref 3.5–5.1)
Sodium: 142 mmol/L (ref 135–145)
Total Bilirubin: 0.3 mg/dL (ref 0.3–1.2)
Total Protein: 7.1 g/dL (ref 6.5–8.1)

## 2019-11-28 LAB — CBC WITH DIFFERENTIAL/PLATELET
Abs Immature Granulocytes: 0.04 10*3/uL (ref 0.00–0.07)
Basophils Absolute: 0.1 10*3/uL (ref 0.0–0.1)
Basophils Relative: 1 %
Eosinophils Absolute: 0.1 10*3/uL (ref 0.0–0.5)
Eosinophils Relative: 1 %
HCT: 31.6 % — ABNORMAL LOW (ref 39.0–52.0)
Hemoglobin: 10.7 g/dL — ABNORMAL LOW (ref 13.0–17.0)
Immature Granulocytes: 0 %
Lymphocytes Relative: 17 %
Lymphs Abs: 1.6 10*3/uL (ref 0.7–4.0)
MCH: 31.4 pg (ref 26.0–34.0)
MCHC: 33.9 g/dL (ref 30.0–36.0)
MCV: 92.7 fL (ref 80.0–100.0)
Monocytes Absolute: 1 10*3/uL (ref 0.1–1.0)
Monocytes Relative: 11 %
Neutro Abs: 6.9 10*3/uL (ref 1.7–7.7)
Neutrophils Relative %: 70 %
Platelets: 222 10*3/uL (ref 150–400)
RBC: 3.41 MIL/uL — ABNORMAL LOW (ref 4.22–5.81)
RDW: 14.9 % (ref 11.5–15.5)
WBC: 9.7 10*3/uL (ref 4.0–10.5)
nRBC: 0 % (ref 0.0–0.2)

## 2019-11-28 MED ORDER — ASPIRIN EC 81 MG PO TBEC: 81.0000 mg | DELAYED_RELEASE_TABLET | Freq: Every day | ORAL | 11 refills | Status: DC

## 2019-11-28 MED ORDER — ACYCLOVIR 400 MG PO TABS: 400.0000 mg | ORAL_TABLET | Freq: Every day | ORAL | 11 refills | Status: DC

## 2019-11-28 MED ORDER — ERGOCALCIFEROL 1.25 MG (50000 UT) PO CAPS: 50000.0000 [IU] | ORAL_CAPSULE | ORAL | 6 refills | Status: DC

## 2019-11-28 MED ORDER — ONDANSETRON HCL 4 MG PO TABS: 8.0000 mg | ORAL_TABLET | Freq: Three times a day (TID) | ORAL | 0 refills | Status: DC | PRN

## 2019-11-28 NOTE — Progress Notes (Signed)
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 His labs today 12/14/2017 show hgb 8.3, platelets 524k. He is doing well overall. He has staples in his right upper leg following a recent surgery and has not been in touch with the surgeons for a f/u of this. He is not receiving physical therapy and is not getting around at home.  He notes that he was not scheduled for a post hospitalization nephrology f/u and has no PCP.  On review of systems, pt reports intermittent fatigue, SOB, pain to the left leg, dizziness, dry mouth and denies fever, chills, night sweats, dysuria and any other accompanying symptoms.     INTERVAL HISTORY:    Andres Fernandez presents today for follow up and continue management of his myeloma/plasma cell leukemia. The patient's last visit with [name] was on 09/24/2019. The pt reports that he is doing well overall.  The pt reports that he tested positive for COVID-19 November 14th, 2020. He thinks he contracted it from going out to eat a restaurant.   He says the only symptom he still has from COVID-19 is a cough when he lays on his back.  He is off of the revlimid. He stopped a little bit before he tested positve for covid-19  He was advised to stop taking acylovir and aspirin by Dr.Rodriguez.   Lab results today (11/28/19) of CBC w/diff and CMP is as follows: all values are WNL except for RBC at 3.41, Hemoglobin at 10.7, HCT at 31.6, CO2 at 19, BUN at 24, Creatinine at 1.75, AST at 9, GFR Est Non Af Am at 41, GFR Est AFR Am at 47.  On review of systems, pt reports a little coughing when he lays on his back and denies having any remaining symptoms from COVID-19, fevers, pain in back, abdominal pain, and any other symptoms.    REVIEW OF SYSTEMS:   A 10+ POINT REVIEW OF SYSTEMS WAS OBTAINED including neurology, dermatology, psychiatry, cardiac, respiratory, lymph, extremities, GI, GU, Musculoskeletal, constitutional, breasts, reproductive, HEENT.  All pertinent positives are noted in the HPI.  All others are negative.     MEDICAL HISTORY:  Past Medical History:  Diagnosis Date  . BPH (benign prostatic hyperplasia)   . Hepatitis C 11/27/2017  . Hypertension   . Plasma cell leukemia ([place]) 11/23/2017  . Tobacco abuse     SURGICAL HISTORY: Past Surgical History:  Procedure Laterality Date  . APPENDECTOMY    . FEMUR IM NAIL Right 11/20/2017   Procedure: RIGHT FEMORAL INTRAMEDULLARY (IM) NAIL;  Surgeon: Nicholes Stairs, MD;  Location: [facility];  Service: Orthopedics;  Laterality: Right;    SOCIAL HISTORY: Social History   Socioeconomic History  . Marital status: Single    Spouse name: Not on file  . Number of children: Not on file  . Years of education: Not on file  . Highest education level: Not on file  Occupational History  . Not on file  Tobacco Use  . Smoking status: Current Every Day Smoker    Packs/day: 0.25    Types: Cigarettes    Last attempt to quit: 11/09/2017    Years since quitting: 2.0  . Smokeless tobacco: Never Used  . Tobacco comment: 1 cigarette/day as of 09/25/18  Substance  and Sexual Activity  . Alcohol use: Yes  . Drug use: No  . Sexual activity: Not on file  Other Topics Concern  . Not on file  Social History Narrative  . Not on file   Social Determinants of Health   Financial Resource Strain:   . Difficulty of Paying Living Expenses: Not on file  Food Insecurity:   . Worried About Charity fundraiser in the Last Year: Not on file  . Ran Out of Food in the Last Year: Not on file  Transportation Needs:   . Lack of Transportation (Medical): Not on file  . Lack of Transportation (Non-Medical): Not on file  Physical Activity:   . Days of Exercise per Week: Not  on file  . Minutes of Exercise per Session: Not on file  Stress:   . Feeling of Stress : Not on file  Social Connections:   . Frequency of Communication with Friends and Family: Not on file  . Frequency of Social Gatherings with Friends and Family: Not on file  . Attends Religious Services: Not on file  . Active Member of Clubs or Organizations: Not on file  . Attends Archivist Meetings: Not on file  . Marital Status: Not on file  Intimate Partner Violence:   . Fear of Current or Ex-Partner: Not on file  . Emotionally Abused: Not on file  . Physically Abused: Not on file  . Sexually Abused: Not on file    FAMILY HISTORY: Family History  Problem Relation Age of Onset  . COPD Mother   . Liver cancer Mother     ALLERGIES:  has No Known Allergies.  MEDICATIONS:  . Current Outpatient Medications on File Prior to Visit  Medication Sig Dispense Refill  . acyclovir (ZOVIRAX) 400 MG tablet Take 1 tablet (400 mg total) by mouth daily. 30 tablet 11  . aspirin EC 81 MG tablet Take 1 tablet (81 mg total) by mouth daily. 60 tablet 11  . cholecalciferol (VITAMIN D) 400 units TABS tablet Take 400 Units by mouth daily.    [name] dexamethasone (DECADRON) 4 MG tablet Take 5 tablets (20 mg total) by mouth once a week. (Patient not taking: Reported on 08/27/2019) 20 tablet 1  . ergocalciferol (VITAMIN D2) 1.25 MG (50000 UT) capsule Take 1 capsule (50,000 Units total) by mouth once a week. 12 capsule 6  . lenalidomide (REVLIMID) 10 MG capsule Take 1 capsule (10 mg total) by mouth daily. Take for 21 days on, 7 days off, repeat every 28 days. 21 capsule 0  . nicotine (NICODERM CQ - DOSED IN MG/24 HOURS) 14 mg/24hr patch PLACE 1 PATCH (14 MG TOTAL) ONTO THE SKIN DAILY. 28 patch 1  . ondansetron (ZOFRAN) 4 MG tablet Take 2 tablets (8 mg total) by mouth every 8 (eight) hours as needed for nausea. 30 tablet 0   No current facility-administered medications on file prior to visit.    PHYSICAL  EXAMINATION:  ECOG FS:1 - Symptomatic but completely ambulatory  Vitals:   11/28/19 0832  BP: 120/75  Pulse: 88  Resp: 18  Temp: 98.9 F (37.2 C)  SpO2: 100%   Wt Readings from Last 3 Encounters:  11/28/19 164 lb 8 oz (74.6 kg)  10/22/19 159 lb 4 oz (72.2 kg)  09/24/19 164 lb (74.4 kg)   Body mass index is 25.01 kg/m.    GENERAL:alert, in no acute distress and comfortable SKIN: no acute rashes, no significant lesions EYES: conjunctiva are pink and  non-injected, sclera anicteric OROPHARYNX: MMM, no exudates, no oropharyngeal erythema or ulceration NECK: supple, no JVD LYMPH:  no palpable lymphadenopathy in the cervical, axillary or inguinal regions LUNGS: clear to auscultation b/l with normal respiratory effort HEART: regular rate & rhythm ABDOMEN:  normoactive bowel sounds , non tender, not distended. Extremity: no pedal edema PSYCH: alert & oriented x 3 with fluent speech NEURO: no focal motor/sensory deficits  LABORATORY DATA:  I have reviewed the data as listed  . CBC Latest Ref Rng & Units 11/28/2019 10/22/2019 10/08/2019  WBC 4.0 - 10.5 K/uL 9.7 5.6 6.3  Hemoglobin 13.0 - 17.0 g/dL 10.7(L) 11.5(L) 11.2(L)  Hematocrit 39.0 - 52.0 % 31.6(L) 33.3(L) 32.9(L)  Platelets 150 - 400 K/uL 222 159 181   . CBC    Component Value Date/Time   WBC 9.7 11/28/2019 0750   RBC 3.41 (L) 11/28/2019 0750   HGB 10.7 (L) 11/28/2019 0750   HGB 11.9 (L) 08/27/2019 0922   HGB 8.3 (L) 12/14/2017 1230   HCT 31.6 (L) 11/28/2019 0750   HCT 23.9 (L) 12/14/2017 1230   PLT 222 11/28/2019 0750   PLT 203 08/27/2019 0922   PLT 524 (H) 12/14/2017 1230   MCV 92.7 11/28/2019 0750   MCV 88.4 12/14/2017 1230   MCH 31.4 11/28/2019 0750   MCHC 33.9 11/28/2019 0750   RDW 14.9 11/28/2019 0750   RDW 15.3 (H) 12/14/2017 1230   LYMPHSABS 1.6 11/28/2019 0750   LYMPHSABS 1.4 12/14/2017 1230   MONOABS 1.0 11/28/2019 0750   MONOABS 0.6 12/14/2017 1230   EOSABS 0.1 11/28/2019 0750   EOSABS 0.1  12/14/2017 1230   BASOSABS 0.1 11/28/2019 0750   BASOSABS 0.1 12/14/2017 1230     . CMP Latest Ref Rng & Units 11/28/2019 10/22/2019 10/08/2019  Glucose 70 - 99 mg/dL 96 96 82  BUN 8 - 23 mg/dL 24(H) 27(H) 34(H)  Creatinine 0.61 - 1.24 mg/dL 1.75(H) 1.77(H) 1.84(H)  Sodium 135 - 145 mmol/L 142 142 142  Potassium 3.5 - 5.1 mmol/L 4.6 4.2 4.2  Chloride 98 - 111 mmol/L 109 112(H) 111  CO2 22 - 32 mmol/L 19(L) 18(L) 18(L)  Calcium 8.9 - 10.3 mg/dL 9.0 9.2 8.7(L)  Total Protein 6.5 - 8.1 g/dL 7.1 6.7 6.7  Total Bilirubin 0.3 - 1.2 mg/dL 0.3 0.4 0.3  Alkaline Phos 38 - 126 U/L 87 73 70  AST 15 - 41 U/L 9(L) 11(L) 11(L)  ALT 0 - 44 U/L 8 13 13    07/02/2019 Bone Marrow Biopsy ([id number]) at [facility]    08/07/18 BM Bx:       RADIOGRAPHIC STUDIES: I have personally reviewed the radiological images as listed and agreed with the findings in the report. No results found.  ASSESSMENT & PLAN:   63 y.o. 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 Appears to be responding to treatment.- with decreased kappa light chains.   Initial BM Bx showed >95% involvement with kappa restricted serum free light chains. 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 MoL Cy-   08/07/18 BM Bx revealed normocellular bone marrow with trilineage hematopoiesis and 1% plasma cells which indicates the pt is in remission 08/06/18 PET/CT revealed  Expansile lytic lesion involving the left iliac bone, unchanged. Lytic lesion involving  the sternum, unchanged. While both are suspicious for myeloma, neither lesion is FDG avid. No hypermetabolic osseous lesions in the visualized axial and appendicular skeleton. Please note that the  bilateral lower extremities were not imaged. No suspicious lymphadenopathy. Spleen is normal in size.    Pt began Ninlaro 38m maintenance after pt finished 13 cycles of CyBorD and BM bx showed only 1% plasma cells, then resumed CyBorD and held Ninlaro in anticipation of his tandem bone marrow transplants [facility] in late December 2019 [facility]  11/19/18 High dose chemotherapy with Melphalan 1473mm2 and autologous stem cell rescue  03/20/19 High dose chemotherapy with Melphalan 2002m2 and autologous stem cell rescue. Non-infectious diarrhea, uncomplicated course otherwise.  07/02/2019 PET scan with results revealing "1. No FDG avid bone lesions are identified. 2.  Asymmetric uptake within the right, greater than left, palatine tonsils with mild fullness of the right tonsillar soft tissue. ENT consult vs attention on follow up suggested. 3.  Innumerable mixed sclerotic and lytic lesions throughout the axial and appendicular skeleton without associated hypermetabolic activity. 4.  Ancillary CT findings as above."  07/02/2019 bone marrow biopsy ([id number])with results showing: "Trilineage hematopoieses with maturation. No plasma cell myeloma identified. Mild anemia and No Rouleaux or circulating plasma cells identified in the peripheral blood."  2) s/p Prophylactic IM nailing for rt femur  [facility]patient previously reported improvement and is currently progressively back to full time work.  3) Thrombocytopenia resolved   4) Renal insufficiency -- likely primarily related to Myeloma multifactorial MM/TLS/sepsis/antibiotics.Primary light chain nephrology Plan -maintain follow up with nephrology referral to Dr ColMarval RegalaNarda Amberdney for continued treatment -Pt completed 8 weeks of maverick for hep c with Dr ComLinus Salmons 07/08/18.  -Kidney numbers improved. 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 -Hgb improved to 11.3   -monitor   [facility]"Recommendations of care for local oncologist:  Dental evaluation twice a year to prevent increased risk of cavities and gum retraction is recommended. It is recommended that the dentist apply fluoride on one of the visits each year.   An annual evaluation with an eye specialist is important to check for possible cataracts formed from all the steroids used with treatment.   A bone density scan every 2 years will monitor for risk of osteoporosis. One of the most important things to focus on is exercise and healthy eating. Joining a program like the [employer] the [employer] can help regain the strength in the legs that will give a stirdier walk and prevent falls. It is important to do an aerobic exercise suitable for the degree of neurophathy and weakness. Water exercises or recumbent bicycles may be good options. Exercise will also help with neuropathy and reduce the risk of cardiovascular complications from transplant.   Maintenance therapy should be started with lenalidomide 10 mg daily, 21 of 28 days and carfilzomib on days 1 and 15.  Monthly labs with CBC, CMP, SPEP, UPEP, quantitative immunoglobulins, and serum free light chains are recommended for the remainder of the first year post-transplant.  Vaccinations will be started after the 6 month visit with us.[place] now and the next post-transplant visit,  the patient should continue care with a PCP and remain on antiviral prophylactic antibiotics for a whole year. Bactrim or Dapsone should be continued until 6 months post-transplant. Acyclovir will be given for a total of 1 year from day of transplant. If the patient or their doctor suspects a  treatment related event they should call right away. We will continue to see Andres Fernandez every 3 months to monitor progress for the first year."   PLAN: -Discussed pt labwork today, 11/28/19;  all values are WNL except for RBC at 3.41, Hemoglobin at 10.7, HCT at 31.6, CO2 at 19, BUN at 24, Creatinine at 1.75, AST at 9, GFR Est Non Af Am at 41, GFR Est AFR Am at 47. -Recommended that he stay away from highly populated areas to avoid contact with COVID-19. -Discussed that he should be taking both the acyclovir and aspirin to help prevent shingles and heart issues. -Will prescribe more acyclovir, ergocalciferol, and Zofran. -Discussed 11/28/19 platelets at 222 -Discussed 11/28/19 wbc at 9.t -Discussed 11/28/19 hemoglobin at 10.7, mild anemia possibly due to recent contracting COVID-19  -Discussed holding today's treatment to allow him to completely heal to ensure the myeloma treatment doesn't interference with the healing process from having COVID-19. Will continue treatment in a month. -Discussed taking influenza vaccination, he has taken the vaccination this year.  -Will see back in 3 weeks.  FOLLOW UP: -No treatment today due to recent COVID 19 infection -Please schedule next dose of Carfilzomib to restart in about 4 weeks as per changed orders with labs and MD visit  The total time spent in the appt was 25 minutes and more than 50% was on counseling and direct patient cares.  All of the patient's questions were answered with apparent satisfaction. The patient knows to call the clinic with any problems, questions or concerns.  Sullivan Lone MD [place] AAHIVMS [facility] Hematology/Oncology Physician [facility]  (Office):       [phone] (Work cell):  [phone] (Fax):           [phone]  I, Scot Dock, am acting as a scribe for Dr. Sullivan Lone.   .I have reviewed the above documentation for accuracy and completeness, and I agree with the above. Brunetta Genera  MD

## 2019-12-01 ENCOUNTER — Telehealth: Payer: Self-pay | Admitting: Hematology

## 2019-12-01 NOTE — Telephone Encounter (Signed)
Scheduled appt per 12/18 los.  Left a vm of the appt date and time 

## 2019-12-25 NOTE — Progress Notes (Signed)
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 His labs today 12/14/2017 show hgb 8.3, platelets 524k. He is doing well overall. He has staples in his right upper leg following a recent surgery and has not been in touch with the surgeons for a f/u of this. He is not receiving physical therapy and is not getting around at home.  He notes that he was not scheduled for a post hospitalization nephrology f/u and has no PCP.  On review of systems, pt reports intermittent fatigue, SOB, pain to the left leg, dizziness, dry mouth and denies fever, chills, night sweats, dysuria and any other accompanying symptoms.     INTERVAL HISTORY:    KAMORI BARBIER presents today for follow up and continue management of his myeloma/plasma cell leukemia. The patient's last visit with [name] was on 11/28/2019. The pt reports that he is doing well overall.  The pt reports that he is well and denies any bothersome residual side effects from COVID19 infection. Pt still has a cough that produces phlegm, which is worsened when he lays down. He has been eating and sleeping well. Pt has not had any new back pain or bone pain. He continues to work and is usually tired by the end of his work week but is able to handle it.   Pt has a BM Bx in March as a part of a one year follow-up for his transplant.   Lab results today (12/26/19) of CBC w/diff and CMP is as follows: all values are WNL except for RBC at 3.65, Hgb at 11.4, HCT at 33.9, Chloride at 112, CO2 at 20, BUN at 24, Creatinine at 1.69, Calcium at 8.7, AST at 10, GFR Est Af Am at 49. 12/26/2019 MMP is in progress 12/26/2019 K/L light chains are in progress  On review of systems, pt reports cough and denies fevers, chills, night  sweats, back pain, bone pain, SOB, leg swelling, constipation, abdominal pain and any other symptoms.    REVIEW OF SYSTEMS:   A 10+ POINT REVIEW OF SYSTEMS WAS OBTAINED including neurology, dermatology, psychiatry, cardiac, respiratory, lymph, extremities, GI, GU, Musculoskeletal, constitutional, breasts, reproductive, HEENT.  All pertinent positives are noted in the HPI.  All others are negative.   MEDICAL HISTORY:  Past Medical History:  Diagnosis Date  . BPH (benign prostatic hyperplasia)   . Hepatitis C 11/27/2017  . Hypertension   . Plasma cell leukemia ([place]) 11/23/2017  . Tobacco abuse     SURGICAL HISTORY: Past Surgical History:  Procedure Laterality Date  . APPENDECTOMY    . FEMUR IM NAIL Right 11/20/2017   Procedure: RIGHT FEMORAL INTRAMEDULLARY (IM) NAIL;  Surgeon: Nicholes Stairs, MD;  Location: [facility];  Service: Orthopedics;  Laterality: Right;    SOCIAL HISTORY: Social History   Socioeconomic History  . Marital status: Single    Spouse name: Not on file  . Number of children: Not on file  . Years of education: Not on file  . Highest education level: Not on file  Occupational History  . Not on file  Tobacco Use  . Smoking status: Current Every Day Smoker    Packs/day: 0.25    Types: Cigarettes    Last attempt to quit: 11/09/2017    Years  since quitting: 2.1  . Smokeless tobacco: Never Used  . Tobacco comment: 1 cigarette/day as of 09/25/18  Substance and Sexual Activity  . Alcohol use: Yes  . Drug use: No  . Sexual activity: Not on file  Other Topics Concern  . Not on file  Social History Narrative  . Not on file   Social Determinants of Health   Financial Resource Strain:   . Difficulty of Paying Living Expenses: Not on file  Food Insecurity:   . Worried About Charity fundraiser in the Last Year: Not on file  . Ran Out of Food in the Last Year: Not on file  Transportation Needs:   . Lack of Transportation (Medical): Not on file  . Lack  of Transportation (Non-Medical): Not on file  Physical Activity:   . Days of Exercise per Week: Not on file  . Minutes of Exercise per Session: Not on file  Stress:   . Feeling of Stress : Not on file  Social Connections:   . Frequency of Communication with Friends and Family: Not on file  . Frequency of Social Gatherings with Friends and Family: Not on file  . Attends Religious Services: Not on file  . Active Member of Clubs or Organizations: Not on file  . Attends Archivist Meetings: Not on file  . Marital Status: Not on file  Intimate Partner Violence:   . Fear of Current or Ex-Partner: Not on file  . Emotionally Abused: Not on file  . Physically Abused: Not on file  . Sexually Abused: Not on file    FAMILY HISTORY: Family History  Problem Relation Age of Onset  . COPD Mother   . Liver cancer Mother     ALLERGIES:  has No Known Allergies.  MEDICATIONS:  . Current Outpatient Medications on File Prior to Visit  Medication Sig Dispense Refill  . acyclovir (ZOVIRAX) 400 MG tablet Take 1 tablet (400 mg total) by mouth daily. 30 tablet 11  . aspirin EC 81 MG tablet Take 1 tablet (81 mg total) by mouth daily. 60 tablet 11  . cholecalciferol (VITAMIN D) 400 units TABS tablet Take 400 Units by mouth daily.    [name] dexamethasone (DECADRON) 4 MG tablet Take 5 tablets (20 mg total) by mouth once a week. (Patient not taking: Reported on 08/27/2019) 20 tablet 1  . ergocalciferol (VITAMIN D2) 1.25 MG (50000 UT) capsule Take 1 capsule (50,000 Units total) by mouth once a week. 12 capsule 6  . lenalidomide (REVLIMID) 10 MG capsule Take 1 capsule (10 mg total) by mouth daily. Take for 21 days on, 7 days off, repeat every 28 days. (Patient not taking: Reported on 11/28/2019) 21 capsule 0  . nicotine (NICODERM CQ - DOSED IN MG/24 HOURS) 14 mg/24hr patch PLACE 1 PATCH (14 MG TOTAL) ONTO THE SKIN DAILY. 28 patch 1  . ondansetron (ZOFRAN) 4 MG tablet Take 2 tablets (8 mg total) by mouth  every 8 (eight) hours as needed for nausea. 30 tablet 0   No current facility-administered medications on file prior to visit.    PHYSICAL EXAMINATION:  ECOG FS:1 - Symptomatic but completely ambulatory  Vitals:   12/26/19 1119  BP: (!) 141/81  Pulse: 79  Resp: 16  Temp: 98.7 F (37.1 C)  SpO2: 100%   Wt Readings from Last 3 Encounters:  12/26/19 159 lb 4.8 oz (72.3 kg)  11/28/19 164 lb 8 oz (74.6 kg)  10/22/19 159 lb 4 oz (72.2 kg)  Body mass index is 24.22 kg/m.    GENERAL:alert, in no acute distress and comfortable SKIN: no acute rashes, no significant lesions EYES: conjunctiva are pink and non-injected, sclera anicteric OROPHARYNX: MMM, no exudates, no oropharyngeal erythema or ulceration NECK: supple, no JVD LYMPH:  no palpable lymphadenopathy in the cervical, axillary or inguinal regions LUNGS: clear to auscultation b/l with normal respiratory effort HEART: regular rate & rhythm ABDOMEN:  normoactive bowel sounds , non tender, not distended. No palpable hepatosplenomegaly.  Extremity: no pedal edema PSYCH: alert & oriented x 3 with fluent speech NEURO: no focal motor/sensory deficits  LABORATORY DATA:  I have reviewed the data as listed  . CBC Latest Ref Rng & Units 12/26/2019 11/28/2019 10/22/2019  WBC 4.0 - 10.5 K/uL 8.3 9.7 5.6  Hemoglobin 13.0 - 17.0 g/dL 11.4(L) 10.7(L) 11.5(L)  Hematocrit 39.0 - 52.0 % 33.9(L) 31.6(L) 33.3(L)  Platelets 150 - 400 K/uL 259 222 159   . CBC    Component Value Date/Time   WBC 8.3 12/26/2019 1047   RBC 3.65 (L) 12/26/2019 1047   HGB 11.4 (L) 12/26/2019 1047   HGB 11.9 (L) 08/27/2019 0922   HGB 8.3 (L) 12/14/2017 1230   HCT 33.9 (L) 12/26/2019 1047   HCT 23.9 (L) 12/14/2017 1230   PLT 259 12/26/2019 1047   PLT 203 08/27/2019 0922   PLT 524 (H) 12/14/2017 1230   MCV 92.9 12/26/2019 1047   MCV 88.4 12/14/2017 1230   MCH 31.2 12/26/2019 1047   MCHC 33.6 12/26/2019 1047   RDW 14.9 12/26/2019 1047   RDW 15.3 (H)  12/14/2017 1230   LYMPHSABS 1.9 12/26/2019 1047   LYMPHSABS 1.4 12/14/2017 1230   MONOABS 0.5 12/26/2019 1047   MONOABS 0.6 12/14/2017 1230   EOSABS 0.1 12/26/2019 1047   EOSABS 0.1 12/14/2017 1230   BASOSABS 0.1 12/26/2019 1047   BASOSABS 0.1 12/14/2017 1230     . CMP Latest Ref Rng & Units 12/26/2019 11/28/2019 10/22/2019  Glucose 70 - 99 mg/dL 95 96 96  BUN 8 - 23 mg/dL 24(H) 24(H) 27(H)  Creatinine 0.61 - 1.24 mg/dL 1.69(H) 1.75(H) 1.77(H)  Sodium 135 - 145 mmol/L 143 142 142  Potassium 3.5 - 5.1 mmol/L 3.9 4.6 4.2  Chloride 98 - 111 mmol/L 112(H) 109 112(H)  CO2 22 - 32 mmol/L 20(L) 19(L) 18(L)  Calcium 8.9 - 10.3 mg/dL 8.7(L) 9.0 9.2  Total Protein 6.5 - 8.1 g/dL 7.0 7.1 6.7  Total Bilirubin 0.3 - 1.2 mg/dL 0.3 0.3 0.4  Alkaline Phos 38 - 126 U/L 72 87 73  AST 15 - 41 U/L 10(L) 9(L) 11(L)  ALT 0 - 44 U/L 8 8 13    07/02/2019 Bone Marrow Biopsy ([id number]) at [facility]    08/07/18 BM Bx:       RADIOGRAPHIC STUDIES: I have personally reviewed the radiological images as listed and agreed with the findings in the report. No results found.  ASSESSMENT & PLAN:   64 y.o. 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 Appears to be responding to treatment.- with decreased kappa light chains.   Initial BM Bx showed >95% involvement with kappa restricted serum free light chains. 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 MoL Cy-   08/07/18 BM Bx revealed normocellular  bone marrow with trilineage hematopoiesis and 1% plasma cells which indicates the pt is in remission 08/06/18 PET/CT revealed  Expansile lytic lesion involving the left iliac bone, unchanged. Lytic lesion involving the  sternum, unchanged. While both are suspicious for myeloma, neither lesion is FDG avid. No hypermetabolic osseous lesions in the visualized axial and appendicular skeleton. Please note that the bilateral lower extremities were not imaged. No suspicious lymphadenopathy. Spleen is normal in size.    Pt began Ninlaro 101m maintenance after pt finished 13 cycles of CyBorD and BM bx showed only 1% plasma cells, then resumed CyBorD and held Ninlaro in anticipation of his tandem bone marrow transplants beginning in late December 2019 [facility]  11/19/18 High dose chemotherapy with Melphalan 1447mm2 and autologous stem cell rescue  03/20/19 High dose chemotherapy with Melphalan 20060m2 and autologous stem cell rescue. Non-infectious diarrhea, uncomplicated course otherwise.  07/02/2019 PET scan with results revealing "1. No FDG avid bone lesions are identified. 2.  Asymmetric uptake within the right, greater than left, palatine tonsils with mild fullness of the right tonsillar soft tissue. ENT consult vs attention on follow up suggested. 3.  Innumerable mixed sclerotic and lytic lesions throughout the axial and appendicular skeleton without associated hypermetabolic activity. 4.  Ancillary CT findings as above."  07/02/2019 bone marrow biopsy ([id number])with results showing: "Trilineage hematopoieses with maturation. No plasma cell myeloma identified. Mild anemia and No Rouleaux or circulating plasma cells identified in the peripheral blood."  2) s/p Prophylactic IM nailing for rt femur  [facility]patient previously reported improvement and is currently progressively back to full time work.  3) Thrombocytopenia resolved   4) Renal insufficiency -- likely primarily related to Myeloma multifactorial MM/TLS/sepsis/antibiotics.Primary light chain nephrology Plan -maintain follow up with nephrology referral to Dr ColMarval RegalaNarda Amberdney  for continued treatment -Pt completed 8 weeks of maverick for hep c with Dr ComLinus Salmons 07/08/18.  -Kidney numbers improved. 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 -Hgb improved to 11.3   -monitor   [facility]"Recommendations of care for local oncologist:  Dental evaluation twice a year to prevent increased risk of cavities and gum retraction is recommended. It is recommended that the dentist apply fluoride on one of the visits each year.   An annual evaluation with an eye specialist is important to check for possible cataracts formed from all the steroids used with treatment.   A bone density scan every 2 years will monitor for risk of osteoporosis. One of the most important things to focus on is exercise and healthy eating. Joining a program like the [employer] the [employer] can help regain the strength in the legs that will give a stirdier walk and prevent falls. It is important to do an aerobic exercise suitable for the degree of neurophathy and weakness. Water exercises or recumbent bicycles may be good options. Exercise will also help with neuropathy and reduce the risk of cardiovascular complications from transplant.   Maintenance therapy should be started with lenalidomide 10 mg daily, 21 of 28 days and carfilzomib on days 1 and 15.  Monthly labs with CBC, CMP, SPEP, UPEP, quantitative immunoglobulins, and serum free  light chains are recommended for the remainder of the first year post-transplant.  Vaccinations will be started after the 6 month visit with [name].  Between now and the next post-transplant visit, the patient should continue care with a PCP and remain on antiviral prophylactic antibiotics for a whole  year. Bactrim or Dapsone should be continued until 6 months post-transplant. Acyclovir will be given for a total of 1 year from day of transplant. If the patient or their doctor suspects a treatment related event they should call right away. We will continue to see Toma Aran every 3 months to monitor progress for the first year."   PLAN: -Discussed pt labwork today, 12/26/19; blood counts and blood chemistries are steady. -Discussed 12/26/2019 MMP is in progress -Discussed 12/26/2019 K/L light chains are in progress -The pt has no prohibitive toxicities from continuing C4D1 of Carfilzomib at this time  -Continue Acyclovir and Aspirin as prescribed -Recommended pt receive COVID19 vaccine when available  -carfilzomib ordered placed, reviewed and signed. -Will see back in 4 weeks with labs   FOLLOW UP: Please schedule cycle 5-day 1 and 15 and cycle 6-day 1 and 15 Port flush and labs with each treatment Can move MD visit from 01/09/2020 to cycle 5-day 1 of treatment in 4 weeks.  The total time spent in the appt was 30 minutes and more than 50% was on counseling and direct patient cares.  All of the patient's questions were answered with apparent satisfaction. The patient knows to call the clinic with any problems, questions or concerns.   Sullivan Lone MD [place] AAHIVMS [facility] Hematology/Oncology Physician [facility]  (Office):       [phone] (Work cell):  [phone] (Fax):           [phone]   I, Yevette Edwards, am acting as a scribe for Dr. Sullivan Lone.   .I have reviewed the above documentation for accuracy and completeness, and I agree with the above. Brunetta Genera MD

## 2019-12-26 ENCOUNTER — Inpatient Hospital Stay: Payer: BC Managed Care – PPO

## 2019-12-26 ENCOUNTER — Inpatient Hospital Stay (HOSPITAL_BASED_OUTPATIENT_CLINIC_OR_DEPARTMENT_OTHER): Payer: BC Managed Care – PPO | Admitting: Hematology

## 2019-12-26 ENCOUNTER — Inpatient Hospital Stay: Payer: BC Managed Care – PPO | Attending: Hematology

## 2019-12-26 ENCOUNTER — Other Ambulatory Visit: Payer: Self-pay

## 2019-12-26 VITALS — BP 141/81 | HR 79 | Temp 98.7°F | Resp 16 | Ht 68.0 in | Wt 159.3 lb

## 2019-12-26 DIAGNOSIS — Z5111 Encounter for antineoplastic chemotherapy: Secondary | ICD-10-CM | POA: Diagnosis not present

## 2019-12-26 DIAGNOSIS — R05 Cough: Secondary | ICD-10-CM | POA: Diagnosis not present

## 2019-12-26 DIAGNOSIS — R682 Dry mouth, unspecified: Secondary | ICD-10-CM | POA: Diagnosis not present

## 2019-12-26 DIAGNOSIS — R0602 Shortness of breath: Secondary | ICD-10-CM | POA: Diagnosis not present

## 2019-12-26 DIAGNOSIS — U071 COVID-19: Secondary | ICD-10-CM | POA: Diagnosis not present

## 2019-12-26 DIAGNOSIS — C9011 Plasma cell leukemia in remission: Secondary | ICD-10-CM

## 2019-12-26 DIAGNOSIS — F17210 Nicotine dependence, cigarettes, uncomplicated: Secondary | ICD-10-CM | POA: Diagnosis not present

## 2019-12-26 DIAGNOSIS — Z8616 Personal history of COVID-19: Secondary | ICD-10-CM | POA: Insufficient documentation

## 2019-12-26 DIAGNOSIS — Z7982 Long term (current) use of aspirin: Secondary | ICD-10-CM | POA: Diagnosis not present

## 2019-12-26 DIAGNOSIS — R42 Dizziness and giddiness: Secondary | ICD-10-CM | POA: Diagnosis not present

## 2019-12-26 DIAGNOSIS — Z7952 Long term (current) use of systemic steroids: Secondary | ICD-10-CM | POA: Diagnosis not present

## 2019-12-26 DIAGNOSIS — C9010 Plasma cell leukemia not having achieved remission: Secondary | ICD-10-CM

## 2019-12-26 DIAGNOSIS — N400 Enlarged prostate without lower urinary tract symptoms: Secondary | ICD-10-CM | POA: Diagnosis not present

## 2019-12-26 DIAGNOSIS — R5383 Other fatigue: Secondary | ICD-10-CM | POA: Insufficient documentation

## 2019-12-26 DIAGNOSIS — I10 Essential (primary) hypertension: Secondary | ICD-10-CM | POA: Insufficient documentation

## 2019-12-26 DIAGNOSIS — Z7189 Other specified counseling: Secondary | ICD-10-CM

## 2019-12-26 DIAGNOSIS — N289 Disorder of kidney and ureter, unspecified: Secondary | ICD-10-CM | POA: Insufficient documentation

## 2019-12-26 DIAGNOSIS — Z79899 Other long term (current) drug therapy: Secondary | ICD-10-CM | POA: Diagnosis not present

## 2019-12-26 LAB — CMP (CANCER CENTER ONLY)
ALT: 8 U/L (ref 0–44)
AST: 10 U/L — ABNORMAL LOW (ref 15–41)
Albumin: 3.9 g/dL (ref 3.5–5.0)
Alkaline Phosphatase: 72 U/L (ref 38–126)
Anion gap: 11 (ref 5–15)
BUN: 24 mg/dL — ABNORMAL HIGH (ref 8–23)
CO2: 20 mmol/L — ABNORMAL LOW (ref 22–32)
Calcium: 8.7 mg/dL — ABNORMAL LOW (ref 8.9–10.3)
Chloride: 112 mmol/L — ABNORMAL HIGH (ref 98–111)
Creatinine: 1.69 mg/dL — ABNORMAL HIGH (ref 0.61–1.24)
GFR, Est AFR Am: 49 mL/min — ABNORMAL LOW (ref >60)
GFR, Estimated: 43 mL/min — ABNORMAL LOW (ref >60)
Glucose, Bld: 95 mg/dL (ref 70–99)
Potassium: 3.9 mmol/L (ref 3.5–5.1)
Sodium: 143 mmol/L (ref 135–145)
Total Bilirubin: 0.3 mg/dL (ref 0.3–1.2)
Total Protein: 7 g/dL (ref 6.5–8.1)

## 2019-12-26 LAB — CBC WITH DIFFERENTIAL/PLATELET
Abs Immature Granulocytes: 0.03 10*3/uL (ref 0.00–0.07)
Basophils Absolute: 0.1 10*3/uL (ref 0.0–0.1)
Basophils Relative: 1 %
Eosinophils Absolute: 0.1 10*3/uL (ref 0.0–0.5)
Eosinophils Relative: 2 %
HCT: 33.9 % — ABNORMAL LOW (ref 39.0–52.0)
Hemoglobin: 11.4 g/dL — ABNORMAL LOW (ref 13.0–17.0)
Immature Granulocytes: 0 %
Lymphocytes Relative: 22 %
Lymphs Abs: 1.9 10*3/uL (ref 0.7–4.0)
MCH: 31.2 pg (ref 26.0–34.0)
MCHC: 33.6 g/dL (ref 30.0–36.0)
MCV: 92.9 fL (ref 80.0–100.0)
Monocytes Absolute: 0.5 10*3/uL (ref 0.1–1.0)
Monocytes Relative: 7 %
Neutro Abs: 5.7 10*3/uL (ref 1.7–7.7)
Neutrophils Relative %: 68 %
Platelets: 259 10*3/uL (ref 150–400)
RBC: 3.65 MIL/uL — ABNORMAL LOW (ref 4.22–5.81)
RDW: 14.9 % (ref 11.5–15.5)
WBC: 8.3 10*3/uL (ref 4.0–10.5)
nRBC: 0 % (ref 0.0–0.2)

## 2019-12-26 MED ORDER — SODIUM CHLORIDE 0.9 % IV SOLN
Freq: Once | INTRAVENOUS | Status: AC
  Filled 2019-12-26: qty 250

## 2019-12-26 MED ORDER — DEXAMETHASONE SODIUM PHOSPHATE 10 MG/ML IJ SOLN
INTRAMUSCULAR | Status: AC
  Filled 2019-12-26: qty 1

## 2019-12-26 MED ORDER — ACETAMINOPHEN 325 MG PO TABS
ORAL_TABLET | ORAL | Status: AC
  Filled 2019-12-26: qty 2

## 2019-12-26 MED ORDER — DEXTROSE 5 % IV SOLN
54.0000 mg/m2 | Freq: Once | INTRAVENOUS | Status: AC
  Administered 2019-12-26: 100 mg via INTRAVENOUS
  Filled 2019-12-26: qty 30

## 2019-12-26 MED ORDER — FAMOTIDINE 20 MG PO TABS
ORAL_TABLET | ORAL | Status: AC
  Filled 2019-12-26: qty 1

## 2019-12-26 MED ORDER — SODIUM CHLORIDE 0.9 % IV SOLN
20.0000 mg | Freq: Once | INTRAVENOUS | Status: AC
  Administered 2019-12-26: 13:00:00 20 mg via INTRAVENOUS
  Filled 2019-12-26: qty 20

## 2019-12-26 MED ORDER — ACETAMINOPHEN 325 MG PO TABS
650.0000 mg | ORAL_TABLET | Freq: Once | ORAL | Status: AC
  Administered 2019-12-26: 13:00:00 650 mg via ORAL

## 2019-12-26 MED ORDER — FAMOTIDINE 20 MG PO TABS
20.0000 mg | ORAL_TABLET | Freq: Once | ORAL | Status: AC
  Administered 2019-12-26: 13:00:00 20 mg via ORAL

## 2019-12-26 MED ORDER — FAMOTIDINE IN NACL 20-0.9 MG/50ML-% IV SOLN
INTRAVENOUS | Status: AC
  Filled 2019-12-26: qty 50

## 2019-12-26 MED ORDER — SODIUM CHLORIDE 0.9 % IV SOLN
INTRAVENOUS | Status: AC
  Filled 2019-12-26: qty 250

## 2019-12-26 NOTE — Patient Instructions (Signed)
[facility] Discharge Instructions for Patients Receiving Chemotherapy  Today you received the following chemotherapy agents: Kyprolis  To help prevent nausea and vomiting after your treatment, we encourage you to take your nausea medication as directed.   If you develop nausea and vomiting that is not controlled by your nausea medication, call the clinic.   BELOW ARE SYMPTOMS THAT SHOULD BE REPORTED IMMEDIATELY:  *FEVER GREATER THAN 100.5 F  *CHILLS WITH OR WITHOUT FEVER  NAUSEA AND VOMITING THAT IS NOT CONTROLLED WITH YOUR NAUSEA MEDICATION  *UNUSUAL SHORTNESS OF BREATH  *UNUSUAL BRUISING OR BLEEDING  TENDERNESS IN MOUTH AND THROAT WITH OR WITHOUT PRESENCE OF ULCERS  *URINARY PROBLEMS  *BOWEL PROBLEMS  UNUSUAL RASH Items with * indicate a potential emergency and should be followed up as soon as possible.  Feel free to call the clinic should you have any questions or concerns. The clinic phone number is [phone].  Please show the [place] at check-in to the Emergency Department and triage nurse.  Pamidronate injection What is this medicine? PAMIDRONATE (pa mi DROE nate) slows calcium loss from bones. It is used to treat high calcium blood levels from cancer or Paget's disease. It is also used to treat bone pain and prevent fractures from certain cancers that have spread to the bone. This medicine may be used for other purposes; ask your health care provider or pharmacist if you have questions. COMMON BRAND NAME(S): Aredia What should I tell my health care provider before I take this medicine? They need to know if you have any of these conditions:  aspirin-sensitive asthma  dental disease  kidney disease  an unusual or allergic reaction to pamidronate, other medicines, foods, dyes, or preservatives  pregnant or trying to get pregnant  breast-feeding How should I use this medicine? This medicine is for infusion into a vein. It is given  by a health care professional in a hospital or clinic setting. Talk to your pediatrician regarding the use of this medicine in children. This medicine is not approved for use in children. Overdosage: If you think you have taken too much of this medicine contact a poison control center or emergency room at once. NOTE: This medicine is only for you. Do not share this medicine with others. What if I miss a dose? This does not apply. What may interact with this medicine?  certain antibiotics given by injection  medicines for inflammation or pain like ibuprofen, naproxen  some diuretics like bumetanide, furosemide  cyclosporine  parathyroid hormone  tacrolimus  teriparatide  thalidomide This list may not describe all possible interactions. Give your health care provider a list of all the medicines, herbs, non-prescription drugs, or dietary supplements you use. Also tell them if you smoke, drink alcohol, or use illegal drugs. Some items may interact with your medicine. What should I watch for while using this medicine? Visit your doctor or health care professional for regular checkups. It may be some time before you see the benefit from this medicine. Do not stop taking your medicine unless your doctor tells you to. Your doctor may order blood tests or other tests to see how you are doing. Women should inform their doctor if they wish to become pregnant or think they might be pregnant. There is a potential for serious side effects to an unborn child. Talk to your health care professional or pharmacist for more information. You should make sure that you get enough calcium and vitamin D while you are  taking this medicine. Discuss the foods you eat and the vitamins you take with your health care professional. Some people who take this medicine have severe bone, joint, and/or muscle pain. This medicine may also increase your risk for a broken thigh bone. Tell your doctor right away if you have pain  in your upper leg or groin. Tell your doctor if you have any pain that does not go away or that gets worse. What side effects may I notice from receiving this medicine? Side effects that you should report to your doctor or health care professional as soon as possible:  allergic reactions like skin rash, itching or hives, swelling of the face, lips, or tongue  black or tarry stools  changes in vision  eye inflammation, pain  high blood pressure  jaw pain, especially burning or cramping  muscle weakness  numb, tingling pain  swelling of feet or hands  trouble passing urine or change in the amount of urine  unable to move easily Side effects that usually do not require medical attention (report to your doctor or health care professional if they continue or are bothersome):  bone, joint, or muscle pain  constipation  dizzy, drowsy  fever  headache  loss of appetite  nausea, vomiting  pain at site where injected This list may not describe all possible side effects. Call your doctor for medical advice about side effects. You may report side effects to FDA at 1-800-FDA-1088. Where should I keep my medicine? This drug is given in a hospital or clinic and will not be stored at home. NOTE: This sheet is a summary. It may not cover all possible information. If you have questions about this medicine, talk to your doctor, pharmacist, or health care provider.  2020 Elsevier/Gold Standard (2011-05-26 08:49:49)

## 2019-12-29 ENCOUNTER — Telehealth: Payer: Self-pay | Admitting: Hematology

## 2019-12-29 LAB — KAPPA/LAMBDA LIGHT CHAINS
Kappa free light chain: 29.9 mg/L — ABNORMAL HIGH (ref 3.3–19.4)
Kappa, lambda light chain ratio: 1.93 — ABNORMAL HIGH (ref 0.26–1.65)
Lambda free light chains: 15.5 mg/L (ref 5.7–26.3)

## 2019-12-29 NOTE — Telephone Encounter (Signed)
Scheduled appt per 1/15 los.  Md stated to schedule a port flush and patient stated he does not have a port.  Spoke with pt and he is aware of his appt date and time.

## 2019-12-31 LAB — MULTIPLE MYELOMA PANEL, SERUM
Albumin SerPl Elph-Mcnc: 3.8 g/dL (ref 2.9–4.4)
Albumin/Glob SerPl: 1.4 (ref 0.7–1.7)
Alpha 1: 0.2 g/dL (ref 0.0–0.4)
Alpha2 Glob SerPl Elph-Mcnc: 1.1 g/dL — ABNORMAL HIGH (ref 0.4–1.0)
B-Globulin SerPl Elph-Mcnc: 0.9 g/dL (ref 0.7–1.3)
Gamma Glob SerPl Elph-Mcnc: 0.5 g/dL (ref 0.4–1.8)
Globulin, Total: 2.8 g/dL (ref 2.2–3.9)
IgA: 74 mg/dL (ref 61–437)
IgG (Immunoglobin G), Serum: 719 mg/dL (ref 603–1613)
IgM (Immunoglobulin M), Srm: 8 mg/dL — ABNORMAL LOW (ref 20–172)
Total Protein ELP: 6.6 g/dL (ref 6.0–8.5)

## 2020-01-09 ENCOUNTER — Other Ambulatory Visit: Payer: Self-pay

## 2020-01-09 ENCOUNTER — Ambulatory Visit: Payer: Self-pay | Admitting: Hematology

## 2020-01-09 ENCOUNTER — Inpatient Hospital Stay: Payer: BC Managed Care – PPO

## 2020-01-09 VITALS — BP 118/77 | HR 77 | Temp 98.0°F | Resp 16

## 2020-01-09 DIAGNOSIS — C9011 Plasma cell leukemia in remission: Secondary | ICD-10-CM

## 2020-01-09 DIAGNOSIS — Z5111 Encounter for antineoplastic chemotherapy: Secondary | ICD-10-CM

## 2020-01-09 DIAGNOSIS — Z7189 Other specified counseling: Secondary | ICD-10-CM

## 2020-01-09 DIAGNOSIS — C9010 Plasma cell leukemia not having achieved remission: Secondary | ICD-10-CM | POA: Diagnosis not present

## 2020-01-09 LAB — CMP (CANCER CENTER ONLY)
ALT: 9 U/L (ref 0–44)
AST: 10 U/L — ABNORMAL LOW (ref 15–41)
Albumin: 4.2 g/dL (ref 3.5–5.0)
Alkaline Phosphatase: 71 U/L (ref 38–126)
Anion gap: 9 (ref 5–15)
BUN: 26 mg/dL — ABNORMAL HIGH (ref 8–23)
CO2: 21 mmol/L — ABNORMAL LOW (ref 22–32)
Calcium: 9.1 mg/dL (ref 8.9–10.3)
Chloride: 113 mmol/L — ABNORMAL HIGH (ref 98–111)
Creatinine: 1.69 mg/dL — ABNORMAL HIGH (ref 0.61–1.24)
GFR, Est AFR Am: 49 mL/min — ABNORMAL LOW (ref >60)
GFR, Estimated: 43 mL/min — ABNORMAL LOW (ref >60)
Glucose, Bld: 103 mg/dL — ABNORMAL HIGH (ref 70–99)
Potassium: 4.1 mmol/L (ref 3.5–5.1)
Sodium: 143 mmol/L (ref 135–145)
Total Bilirubin: 0.4 mg/dL (ref 0.3–1.2)
Total Protein: 7.1 g/dL (ref 6.5–8.1)

## 2020-01-09 LAB — CBC WITH DIFFERENTIAL/PLATELET
Abs Immature Granulocytes: 0.02 10*3/uL (ref 0.00–0.07)
Basophils Absolute: 0.1 10*3/uL (ref 0.0–0.1)
Basophils Relative: 1 %
Eosinophils Absolute: 0.2 10*3/uL (ref 0.0–0.5)
Eosinophils Relative: 2 %
HCT: 36.2 % — ABNORMAL LOW (ref 39.0–52.0)
Hemoglobin: 12.2 g/dL — ABNORMAL LOW (ref 13.0–17.0)
Immature Granulocytes: 0 %
Lymphocytes Relative: 21 %
Lymphs Abs: 1.7 10*3/uL (ref 0.7–4.0)
MCH: 31.1 pg (ref 26.0–34.0)
MCHC: 33.7 g/dL (ref 30.0–36.0)
MCV: 92.3 fL (ref 80.0–100.0)
Monocytes Absolute: 0.6 10*3/uL (ref 0.1–1.0)
Monocytes Relative: 8 %
Neutro Abs: 5.4 10*3/uL (ref 1.7–7.7)
Neutrophils Relative %: 68 %
Platelets: 231 10*3/uL (ref 150–400)
RBC: 3.92 MIL/uL — ABNORMAL LOW (ref 4.22–5.81)
RDW: 15.5 % (ref 11.5–15.5)
WBC: 8 10*3/uL (ref 4.0–10.5)
nRBC: 0 % (ref 0.0–0.2)

## 2020-01-09 MED ORDER — ACETAMINOPHEN 325 MG PO TABS
ORAL_TABLET | ORAL | Status: AC
  Filled 2020-01-09: qty 2

## 2020-01-09 MED ORDER — FAMOTIDINE 20 MG PO TABS
ORAL_TABLET | ORAL | Status: AC
  Filled 2020-01-09: qty 1

## 2020-01-09 MED ORDER — SODIUM CHLORIDE 0.9 % IV SOLN
Freq: Once | INTRAVENOUS | Status: DC
  Filled 2020-01-09: qty 250

## 2020-01-09 MED ORDER — SODIUM CHLORIDE 0.9 % IV SOLN
INTRAVENOUS | Status: AC
  Filled 2020-01-09: qty 250

## 2020-01-09 MED ORDER — ACETAMINOPHEN 325 MG PO TABS
650.0000 mg | ORAL_TABLET | Freq: Once | ORAL | Status: AC
  Administered 2020-01-09: 650 mg via ORAL

## 2020-01-09 MED ORDER — SODIUM CHLORIDE 0.9 % IV SOLN
20.0000 mg | Freq: Once | INTRAVENOUS | Status: AC
  Administered 2020-01-09: 20 mg via INTRAVENOUS
  Filled 2020-01-09: qty 20

## 2020-01-09 MED ORDER — SODIUM CHLORIDE 0.9% FLUSH
10.0000 mL | INTRAVENOUS | Status: DC | PRN
  Filled 2020-01-09: qty 10

## 2020-01-09 MED ORDER — SODIUM CHLORIDE 0.9 % IV SOLN
Freq: Once | INTRAVENOUS | Status: AC
  Filled 2020-01-09: qty 250

## 2020-01-09 MED ORDER — HEPARIN SOD (PORK) LOCK FLUSH 100 UNIT/ML IV SOLN
500.0000 [IU] | Freq: Once | INTRAVENOUS | Status: DC | PRN
  Filled 2020-01-09: qty 5

## 2020-01-09 MED ORDER — DEXTROSE 5 % IV SOLN
54.0000 mg/m2 | Freq: Once | INTRAVENOUS | Status: AC
  Administered 2020-01-09: 100 mg via INTRAVENOUS
  Filled 2020-01-09: qty 15

## 2020-01-09 MED ORDER — FAMOTIDINE 20 MG PO TABS
20.0000 mg | ORAL_TABLET | Freq: Once | ORAL | Status: AC
  Administered 2020-01-09: 11:00:00 20 mg via ORAL

## 2020-01-09 NOTE — Patient Instructions (Signed)
[facility] Discharge Instructions for Patients Receiving Chemotherapy  Today you received the following chemotherapy agents:  Kyprolis  To help prevent nausea and vomiting after your treatment, we encourage you to take your nausea medication as prescribed.   If you develop nausea and vomiting that is not controlled by your nausea medication, call the clinic.   BELOW ARE SYMPTOMS THAT SHOULD BE REPORTED IMMEDIATELY:  *FEVER GREATER THAN 100.5 F  *CHILLS WITH OR WITHOUT FEVER  NAUSEA AND VOMITING THAT IS NOT CONTROLLED WITH YOUR NAUSEA MEDICATION  *UNUSUAL SHORTNESS OF BREATH  *UNUSUAL BRUISING OR BLEEDING  TENDERNESS IN MOUTH AND THROAT WITH OR WITHOUT PRESENCE OF ULCERS  *URINARY PROBLEMS  *BOWEL PROBLEMS  UNUSUAL RASH Items with * indicate a potential emergency and should be followed up as soon as possible.  Feel free to call the clinic should you have any questions or concerns. The clinic phone number is [phone].  Please show the CHEMO ALERT CARD at check-in to the Emergency Department and triage nurse.   

## 2020-01-09 NOTE — Progress Notes (Signed)
Dr. Irene Limbo Verbal Order: OK to receive treatment today with Creatinine 1.69

## 2020-01-12 LAB — KAPPA/LAMBDA LIGHT CHAINS
Kappa free light chain: 22.8 mg/L — ABNORMAL HIGH (ref 3.3–19.4)
Kappa, lambda light chain ratio: 1.95 — ABNORMAL HIGH (ref 0.26–1.65)
Lambda free light chains: 11.7 mg/L (ref 5.7–26.3)

## 2020-01-13 LAB — MULTIPLE MYELOMA PANEL, SERUM
Albumin SerPl Elph-Mcnc: 3.8 g/dL (ref 2.9–4.4)
Albumin/Glob SerPl: 1.4 (ref 0.7–1.7)
Alpha 1: 0.2 g/dL (ref 0.0–0.4)
Alpha2 Glob SerPl Elph-Mcnc: 1.1 g/dL — ABNORMAL HIGH (ref 0.4–1.0)
B-Globulin SerPl Elph-Mcnc: 0.9 g/dL (ref 0.7–1.3)
Gamma Glob SerPl Elph-Mcnc: 0.6 g/dL (ref 0.4–1.8)
Globulin, Total: 2.9 g/dL (ref 2.2–3.9)
IgA: 51 mg/dL — ABNORMAL LOW (ref 61–437)
IgG (Immunoglobin G), Serum: 659 mg/dL (ref 603–1613)
IgM (Immunoglobulin M), Srm: 8 mg/dL — ABNORMAL LOW (ref 20–172)
Total Protein ELP: 6.7 g/dL (ref 6.0–8.5)

## 2020-01-14 ENCOUNTER — Inpatient Hospital Stay: Payer: BC Managed Care – PPO | Attending: Hematology

## 2020-01-14 ENCOUNTER — Other Ambulatory Visit: Payer: Self-pay

## 2020-01-14 VITALS — BP 138/92 | HR 76 | Temp 98.9°F | Resp 16

## 2020-01-14 DIAGNOSIS — Z7952 Long term (current) use of systemic steroids: Secondary | ICD-10-CM | POA: Diagnosis not present

## 2020-01-14 DIAGNOSIS — N400 Enlarged prostate without lower urinary tract symptoms: Secondary | ICD-10-CM | POA: Diagnosis not present

## 2020-01-14 DIAGNOSIS — Z5111 Encounter for antineoplastic chemotherapy: Secondary | ICD-10-CM | POA: Diagnosis not present

## 2020-01-14 DIAGNOSIS — R682 Dry mouth, unspecified: Secondary | ICD-10-CM | POA: Diagnosis not present

## 2020-01-14 DIAGNOSIS — B1920 Unspecified viral hepatitis C without hepatic coma: Secondary | ICD-10-CM | POA: Diagnosis not present

## 2020-01-14 DIAGNOSIS — I10 Essential (primary) hypertension: Secondary | ICD-10-CM | POA: Diagnosis not present

## 2020-01-14 DIAGNOSIS — Z79899 Other long term (current) drug therapy: Secondary | ICD-10-CM | POA: Insufficient documentation

## 2020-01-14 DIAGNOSIS — R5383 Other fatigue: Secondary | ICD-10-CM | POA: Diagnosis not present

## 2020-01-14 DIAGNOSIS — C9010 Plasma cell leukemia not having achieved remission: Secondary | ICD-10-CM | POA: Diagnosis not present

## 2020-01-14 DIAGNOSIS — Z7982 Long term (current) use of aspirin: Secondary | ICD-10-CM | POA: Insufficient documentation

## 2020-01-14 DIAGNOSIS — R42 Dizziness and giddiness: Secondary | ICD-10-CM | POA: Insufficient documentation

## 2020-01-14 DIAGNOSIS — Z7189 Other specified counseling: Secondary | ICD-10-CM

## 2020-01-14 DIAGNOSIS — R0602 Shortness of breath: Secondary | ICD-10-CM | POA: Insufficient documentation

## 2020-01-14 DIAGNOSIS — F17210 Nicotine dependence, cigarettes, uncomplicated: Secondary | ICD-10-CM | POA: Insufficient documentation

## 2020-01-14 DIAGNOSIS — N289 Disorder of kidney and ureter, unspecified: Secondary | ICD-10-CM | POA: Diagnosis not present

## 2020-01-14 DIAGNOSIS — Z9484 Stem cells transplant status: Secondary | ICD-10-CM | POA: Insufficient documentation

## 2020-01-14 DIAGNOSIS — M84551A Pathological fracture in neoplastic disease, right femur, initial encounter for fracture: Secondary | ICD-10-CM

## 2020-01-14 MED ORDER — SODIUM CHLORIDE 0.9 % IV SOLN
60.0000 mg | Freq: Once | INTRAVENOUS | Status: AC
  Administered 2020-01-14: 60 mg via INTRAVENOUS
  Filled 2020-01-14: qty 10

## 2020-01-14 MED ORDER — SODIUM CHLORIDE 0.9 % IV SOLN
Freq: Once | INTRAVENOUS | Status: AC
  Filled 2020-01-14: qty 250

## 2020-01-14 NOTE — Patient Instructions (Signed)
Pamidronate injection What is this medicine? PAMIDRONATE (pa mi DROE nate) slows calcium loss from bones. It is used to treat high calcium blood levels from cancer or Paget's disease. It is also used to treat bone pain and prevent fractures from certain cancers that have spread to the bone. This medicine may be used for other purposes; ask your health care provider or pharmacist if you have questions. COMMON BRAND NAME(S): Aredia What should I tell my health care provider before I take this medicine? They need to know if you have any of these conditions:  aspirin-sensitive asthma  dental disease  kidney disease  an unusual or allergic reaction to pamidronate, other medicines, foods, dyes, or preservatives  pregnant or trying to get pregnant  breast-feeding How should I use this medicine? This medicine is for infusion into a vein. It is given by a health care professional in a hospital or clinic setting. Talk to your pediatrician regarding the use of this medicine in children. This medicine is not approved for use in children. Overdosage: If you think you have taken too much of this medicine contact a poison control center or emergency room at once. NOTE: This medicine is only for you. Do not share this medicine with others. What if I miss a dose? This does not apply. What may interact with this medicine?  certain antibiotics given by injection  medicines for inflammation or pain like ibuprofen, naproxen  some diuretics like bumetanide, furosemide  cyclosporine  parathyroid hormone  tacrolimus  teriparatide  thalidomide This list may not describe all possible interactions. Give your health care provider a list of all the medicines, herbs, non-prescription drugs, or dietary supplements you use. Also tell them if you smoke, drink alcohol, or use illegal drugs. Some items may interact with your medicine. What should I watch for while using this medicine? Visit your doctor or  health care professional for regular checkups. It may be some time before you see the benefit from this medicine. Do not stop taking your medicine unless your doctor tells you to. Your doctor may order blood tests or other tests to see how you are doing. Women should inform their doctor if they wish to become pregnant or think they might be pregnant. There is a potential for serious side effects to an unborn child. Talk to your health care professional or pharmacist for more information. You should make sure that you get enough calcium and vitamin D while you are taking this medicine. Discuss the foods you eat and the vitamins you take with your health care professional. Some people who take this medicine have severe bone, joint, and/or muscle pain. This medicine may also increase your risk for a broken thigh bone. Tell your doctor right away if you have pain in your upper leg or groin. Tell your doctor if you have any pain that does not go away or that gets worse. What side effects may I notice from receiving this medicine? Side effects that you should report to your doctor or health care professional as soon as possible:  allergic reactions like skin rash, itching or hives, swelling of the face, lips, or tongue  black or tarry stools  changes in vision  eye inflammation, pain  high blood pressure  jaw pain, especially burning or cramping  muscle weakness  numb, tingling pain  swelling of feet or hands  trouble passing urine or change in the amount of urine  unable to move easily Side effects that usually do not  require medical attention (report to your doctor or health care professional if they continue or are bothersome):  bone, joint, or muscle pain  constipation  dizzy, drowsy  fever  headache  loss of appetite  nausea, vomiting  pain at site where injected This list may not describe all possible side effects. Call your doctor for medical advice about side effects.  You may report side effects to FDA at 1-800-FDA-1088. Where should I keep my medicine? This drug is given in a hospital or clinic and will not be stored at home. NOTE: This sheet is a summary. It may not cover all possible information. If you have questions about this medicine, talk to your doctor, pharmacist, or health care provider.  2020 Elsevier/Gold Standard (2011-05-26 08:49:49)  

## 2020-01-23 ENCOUNTER — Inpatient Hospital Stay: Payer: BC Managed Care – PPO

## 2020-01-23 ENCOUNTER — Inpatient Hospital Stay (HOSPITAL_BASED_OUTPATIENT_CLINIC_OR_DEPARTMENT_OTHER): Payer: BC Managed Care – PPO | Admitting: Hematology

## 2020-01-23 ENCOUNTER — Other Ambulatory Visit: Payer: Self-pay

## 2020-01-23 VITALS — BP 113/86 | HR 85 | Temp 99.1°F | Resp 18 | Ht 68.0 in | Wt 160.1 lb

## 2020-01-23 DIAGNOSIS — C9011 Plasma cell leukemia in remission: Secondary | ICD-10-CM

## 2020-01-23 DIAGNOSIS — C9010 Plasma cell leukemia not having achieved remission: Secondary | ICD-10-CM

## 2020-01-23 DIAGNOSIS — Z5111 Encounter for antineoplastic chemotherapy: Secondary | ICD-10-CM | POA: Diagnosis not present

## 2020-01-23 DIAGNOSIS — Z7189 Other specified counseling: Secondary | ICD-10-CM

## 2020-01-23 LAB — CBC WITH DIFFERENTIAL/PLATELET
Abs Immature Granulocytes: 0.04 10*3/uL (ref 0.00–0.07)
Basophils Absolute: 0.1 10*3/uL (ref 0.0–0.1)
Basophils Relative: 1 %
Eosinophils Absolute: 0.1 10*3/uL (ref 0.0–0.5)
Eosinophils Relative: 1 %
HCT: 39.7 % (ref 39.0–52.0)
Hemoglobin: 13.4 g/dL (ref 13.0–17.0)
Immature Granulocytes: 1 %
Lymphocytes Relative: 19 %
Lymphs Abs: 1.5 10*3/uL (ref 0.7–4.0)
MCH: 31.5 pg (ref 26.0–34.0)
MCHC: 33.8 g/dL (ref 30.0–36.0)
MCV: 93.2 fL (ref 80.0–100.0)
Monocytes Absolute: 0.5 10*3/uL (ref 0.1–1.0)
Monocytes Relative: 6 %
Neutro Abs: 5.7 10*3/uL (ref 1.7–7.7)
Neutrophils Relative %: 72 %
Platelets: 227 10*3/uL (ref 150–400)
RBC: 4.26 MIL/uL (ref 4.22–5.81)
RDW: 15 % (ref 11.5–15.5)
WBC: 7.8 10*3/uL (ref 4.0–10.5)
nRBC: 0 % (ref 0.0–0.2)

## 2020-01-23 LAB — CMP (CANCER CENTER ONLY)
ALT: 11 U/L (ref 0–44)
AST: 11 U/L — ABNORMAL LOW (ref 15–41)
Albumin: 4.1 g/dL (ref 3.5–5.0)
Alkaline Phosphatase: 65 U/L (ref 38–126)
Anion gap: 11 (ref 5–15)
BUN: 27 mg/dL — ABNORMAL HIGH (ref 8–23)
CO2: 21 mmol/L — ABNORMAL LOW (ref 22–32)
Calcium: 8.9 mg/dL (ref 8.9–10.3)
Chloride: 111 mmol/L (ref 98–111)
Creatinine: 1.72 mg/dL — ABNORMAL HIGH (ref 0.61–1.24)
GFR, Est AFR Am: 48 mL/min — ABNORMAL LOW (ref >60)
GFR, Estimated: 42 mL/min — ABNORMAL LOW (ref >60)
Glucose, Bld: 110 mg/dL — ABNORMAL HIGH (ref 70–99)
Potassium: 4.2 mmol/L (ref 3.5–5.1)
Sodium: 143 mmol/L (ref 135–145)
Total Bilirubin: 0.4 mg/dL (ref 0.3–1.2)
Total Protein: 7 g/dL (ref 6.5–8.1)

## 2020-01-23 MED ORDER — HEPARIN SOD (PORK) LOCK FLUSH 100 UNIT/ML IV SOLN
500.0000 [IU] | Freq: Once | INTRAVENOUS | Status: DC | PRN
  Filled 2020-01-23: qty 5

## 2020-01-23 MED ORDER — ACETAMINOPHEN 325 MG PO TABS
ORAL_TABLET | ORAL | Status: AC
  Filled 2020-01-23: qty 2

## 2020-01-23 MED ORDER — DIPHENHYDRAMINE HCL 25 MG PO CAPS
ORAL_CAPSULE | ORAL | Status: AC
  Filled 2020-01-23: qty 1

## 2020-01-23 MED ORDER — FAMOTIDINE 20 MG PO TABS
ORAL_TABLET | ORAL | Status: AC
  Filled 2020-01-23: qty 1

## 2020-01-23 MED ORDER — FAMOTIDINE 20 MG PO TABS
20.0000 mg | ORAL_TABLET | Freq: Once | ORAL | Status: AC
  Administered 2020-01-23: 20 mg via ORAL

## 2020-01-23 MED ORDER — ACETAMINOPHEN 325 MG PO TABS
650.0000 mg | ORAL_TABLET | Freq: Once | ORAL | Status: AC
  Administered 2020-01-23: 650 mg via ORAL

## 2020-01-23 MED ORDER — SODIUM CHLORIDE 0.9 % IV SOLN
Freq: Once | INTRAVENOUS | Status: AC
  Filled 2020-01-23: qty 250

## 2020-01-23 MED ORDER — DEXTROSE 5 % IV SOLN
54.0000 mg/m2 | Freq: Once | INTRAVENOUS | Status: AC
  Administered 2020-01-23: 11:00:00 100 mg via INTRAVENOUS
  Filled 2020-01-23: qty 5

## 2020-01-23 MED ORDER — SODIUM CHLORIDE 0.9% FLUSH
10.0000 mL | INTRAVENOUS | Status: DC | PRN
  Filled 2020-01-23: qty 10

## 2020-01-23 MED ORDER — SODIUM CHLORIDE 0.9 % IV SOLN
Freq: Once | INTRAVENOUS | Status: DC
  Filled 2020-01-23: qty 250

## 2020-01-23 MED ORDER — SODIUM CHLORIDE 0.9 % IV SOLN
20.0000 mg | Freq: Once | INTRAVENOUS | Status: AC
  Administered 2020-01-23: 20 mg via INTRAVENOUS
  Filled 2020-01-23: qty 20

## 2020-01-23 MED ORDER — LENALIDOMIDE 10 MG PO CAPS: 10.0000 mg | ORAL_CAPSULE | Freq: Every day | ORAL | 3 refills | Status: DC

## 2020-01-23 MED ORDER — SODIUM CHLORIDE 0.9 % IV SOLN
INTRAVENOUS | Status: AC
  Filled 2020-01-23 (×2): qty 250

## 2020-01-23 NOTE — Patient Instructions (Signed)
[facility] Discharge Instructions for Patients Receiving Chemotherapy  Today you received the following chemotherapy agents :  carfilzomib (Kyprolis)  To help prevent nausea and vomiting after your treatment, we encourage you to take your nausea medication as prescribed.   If you develop nausea and vomiting that is not controlled by your nausea medication, call the clinic.   BELOW ARE SYMPTOMS THAT SHOULD BE REPORTED IMMEDIATELY:  *FEVER GREATER THAN 100.5 F  *CHILLS WITH OR WITHOUT FEVER  NAUSEA AND VOMITING THAT IS NOT CONTROLLED WITH YOUR NAUSEA MEDICATION  *UNUSUAL SHORTNESS OF BREATH  *UNUSUAL BRUISING OR BLEEDING  TENDERNESS IN MOUTH AND THROAT WITH OR WITHOUT PRESENCE OF ULCERS  *URINARY PROBLEMS  *BOWEL PROBLEMS  UNUSUAL RASH Items with * indicate a potential emergency and should be followed up as soon as possible.  Feel free to call the clinic should you have any questions or concerns. The clinic phone number is [phone].  Please show the CHEMO ALERT CARD at check-in to the Emergency Department and triage nurse.   

## 2020-01-23 NOTE — Progress Notes (Signed)
Per Dr. Irene Limbo, okay for patient to receive treatment today with creatine of 1.72.

## 2020-01-26 ENCOUNTER — Telehealth: Payer: Self-pay

## 2020-01-26 NOTE — Telephone Encounter (Signed)
Oral Oncology Patient Advocate Encounter  Received notification from [place] [employer] [place] that prior authorization for Revlimid is required.  PA submitted on CoverMyMeds Key [id number] Status is pending  Oral Oncology Clinic will continue to follow.  [place] Patient [place] Phone [phone] Fax [phone] 01/26/2020 10:24 AM

## 2020-01-26 NOTE — Telephone Encounter (Signed)
Oral Oncology Patient Advocate Encounter  Prior Authorization for Revlimid has been approved.    PA# [id number]  Effective dates: 01/26/20 through 01/24/21  Oral Oncology Clinic will continue to follow.   JA[place] Patient [place] Phone [phone] Fax [phone] 01/26/2020 12:47 PM

## 2020-01-29 NOTE — Progress Notes (Signed)
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 His labs today 12/14/2017 show hgb 8.3, platelets 524k. He is doing well overall. He has staples in his right upper leg following a recent surgery and has not been in touch with the surgeons for a f/u of this. He is not receiving physical therapy and is not getting around at home.  He notes that he was not scheduled for a post hospitalization nephrology f/u and has no PCP.  On review of systems, pt reports intermittent fatigue, SOB, pain to the left leg, dizziness, dry mouth and denies fever, chills, night sweats, dysuria and any other accompanying symptoms.     INTERVAL HISTORY:    DAVEY LIMAS presents today for follow up and continue management of his myeloma/plasma cell leukemia. The patient's last visit with [name] was on 12/26/2019.  The pt reports that he is doing well overall.  The pt reports that he has no acute new symptoms. No bone pains. No Prohibitive toxicities from his maintenance therapy.  Pt has a BM Bx in March as a part of a one year follow-up for his transplant.   Lab results today reviewed with the patient  On review of systems, pt reports no chest pain, SOB, fevers or chills. No bone pains.  REVIEW OF SYSTEMS:   A 10+ POINT REVIEW OF SYSTEMS WAS OBTAINED including neurology, dermatology, psychiatry, cardiac, respiratory, lymph, extremities, GI, GU, Musculoskeletal, constitutional, breasts, reproductive, HEENT.  All pertinent positives are noted in the HPI.  All others are negative.   MEDICAL HISTORY:  Past Medical History:  Diagnosis Date  . BPH (benign prostatic hyperplasia)   . Hepatitis C 11/27/2017  . Hypertension   . Plasma cell leukemia ([place]) 11/23/2017  . Tobacco  abuse     SURGICAL HISTORY: Past Surgical History:  Procedure Laterality Date  . APPENDECTOMY    . FEMUR IM NAIL Right 11/20/2017   Procedure: RIGHT FEMORAL INTRAMEDULLARY (IM) NAIL;  Surgeon: Nicholes Stairs, MD;  Location: [place];  Service: Orthopedics;  Laterality: Right;    SOCIAL HISTORY: Social History   Socioeconomic History  . Marital status: Single    Spouse name: Not on file  . Number of children: Not on file  . Years of education: Not on file  . Highest education level: Not on file  Occupational History  . Not on file  Tobacco Use  . Smoking status: Current Every Day Smoker    Packs/day: 0.25    Types: Cigarettes    Last attempt to quit: 11/09/2017    Years since quitting: 2.2  . Smokeless tobacco: Never Used  . Tobacco comment: 1 cigarette/day as of 09/25/18  Substance and Sexual Activity  . Alcohol use: Yes  . Drug use: No  . Sexual activity: Not on file  Other Topics Concern  . Not on file  Social History Narrative  . Not on file   Social Determinants of Health   Financial Resource Strain:   . Difficulty of Paying Living Expenses: Not on file  Food Insecurity:   . Worried About Charity fundraiser in the Last Year: Not on file  . Ran Out of Food in the Last Year: Not on file  Transportation Needs:   . Lack of Transportation (  Medical): Not on file  . Lack of Transportation (Non-Medical): Not on file  Physical Activity:   . Days of Exercise per Week: Not on file  . Minutes of Exercise per Session: Not on file  Stress:   . Feeling of Stress : Not on file  Social Connections:   . Frequency of Communication with Friends and Family: Not on file  . Frequency of Social Gatherings with Friends and Family: Not on file  . Attends Religious Services: Not on file  . Active Member of Clubs or Organizations: Not on file  . Attends Archivist Meetings: Not on file  . Marital Status: Not on file  Intimate Partner Violence:   . Fear of Current  or Ex-Partner: Not on file  . Emotionally Abused: Not on file  . Physically Abused: Not on file  . Sexually Abused: Not on file    FAMILY HISTORY: Family History  Problem Relation Age of Onset  . COPD Mother   . Liver cancer Mother     ALLERGIES:  has No Known Allergies.  MEDICATIONS:  . Current Outpatient Medications on File Prior to Visit  Medication Sig Dispense Refill  . acyclovir (ZOVIRAX) 400 MG tablet Take 1 tablet (400 mg total) by mouth daily. 30 tablet 11  . aspirin EC 81 MG tablet Take 1 tablet (81 mg total) by mouth daily. 60 tablet 11  . Multiple Vitamins-Minerals (MULTIVITAMIN WITH MINERALS) tablet Take 1 tablet by mouth daily.    . nicotine (NICODERM CQ - DOSED IN MG/24 HOURS) 14 mg/24hr patch PLACE 1 PATCH (14 MG TOTAL) ONTO THE SKIN DAILY. 28 patch 1  . cholecalciferol (VITAMIN D) 400 units TABS tablet Take 400 Units by mouth daily.    [name] dexamethasone (DECADRON) 4 MG tablet Take 5 tablets (20 mg total) by mouth once a week. (Patient not taking: Reported on 08/27/2019) 20 tablet 1  . ergocalciferol (VITAMIN D2) 1.25 MG (50000 UT) capsule Take 1 capsule (50,000 Units total) by mouth once a week. (Patient not taking: Reported on 01/23/2020) 12 capsule 6  . ondansetron (ZOFRAN) 4 MG tablet Take 2 tablets (8 mg total) by mouth every 8 (eight) hours as needed for nausea. (Patient not taking: Reported on 01/23/2020) 30 tablet 0   No current facility-administered medications on file prior to visit.    PHYSICAL EXAMINATION:  ECOG FS:1 - Symptomatic but completely ambulatory  Vitals:   01/23/20 0932  BP: 113/86  Pulse: 85  Resp: 18  Temp: 99.1 F (37.3 C)  SpO2: 100%   Wt Readings from Last 3 Encounters:  01/23/20 160 lb 1.6 oz (72.6 kg)  12/26/19 159 lb 4.8 oz (72.3 kg)  11/28/19 164 lb 8 oz (74.6 kg)   Body mass index is 24.34 kg/m.    GENERAL:alert, in no acute distress and comfortable SKIN: no acute rashes, no significant lesions EYES: conjunctiva are pink  and non-injected, sclera anicteric OROPHARYNX: MMM, no exudates, no oropharyngeal erythema or ulceration NECK: supple, no JVD LYMPH:  no palpable lymphadenopathy in the cervical, axillary or inguinal regions LUNGS: clear to auscultation b/l with normal respiratory effort HEART: regular rate & rhythm ABDOMEN:  normoactive bowel sounds , non tender, not distended. No palpable hepatosplenomegaly.  Extremity: no pedal edema PSYCH: alert & oriented x 3 with fluent speech NEURO: no focal motor/sensory deficits  LABORATORY DATA:  I have reviewed the data as listed  . CBC Latest Ref Rng & Units 01/23/2020 01/09/2020 12/26/2019  WBC 4.0 - 10.5 K/uL  7.8 8.0 8.3  Hemoglobin 13.0 - 17.0 g/dL 13.4 12.2(L) 11.4(L)  Hematocrit 39.0 - 52.0 % 39.7 36.2(L) 33.9(L)  Platelets 150 - 400 K/uL 227 231 259   . CBC    Component Value Date/Time   WBC 7.8 01/23/2020 0904   RBC 4.26 01/23/2020 0904   HGB 13.4 01/23/2020 0904   HGB 11.9 (L) 08/27/2019 0922   HGB 8.3 (L) 12/14/2017 1230   HCT 39.7 01/23/2020 0904   HCT 23.9 (L) 12/14/2017 1230   PLT 227 01/23/2020 0904   PLT 203 08/27/2019 0922   PLT 524 (H) 12/14/2017 1230   MCV 93.2 01/23/2020 0904   MCV 88.4 12/14/2017 1230   MCH 31.5 01/23/2020 0904   MCHC 33.8 01/23/2020 0904   RDW 15.0 01/23/2020 0904   RDW 15.3 (H) 12/14/2017 1230   LYMPHSABS 1.5 01/23/2020 0904   LYMPHSABS 1.4 12/14/2017 1230   MONOABS 0.5 01/23/2020 0904   MONOABS 0.6 12/14/2017 1230   EOSABS 0.1 01/23/2020 0904   EOSABS 0.1 12/14/2017 1230   BASOSABS 0.1 01/23/2020 0904   BASOSABS 0.1 12/14/2017 1230     . CMP Latest Ref Rng & Units 01/23/2020 01/09/2020 12/26/2019  Glucose 70 - 99 mg/dL 110(H) 103(H) 95  BUN 8 - 23 mg/dL 27(H) 26(H) 24(H)  Creatinine 0.61 - 1.24 mg/dL 1.72(H) 1.69(H) 1.69(H)  Sodium 135 - 145 mmol/L 143 143 143  Potassium 3.5 - 5.1 mmol/L 4.2 4.1 3.9  Chloride 98 - 111 mmol/L 111 113(H) 112(H)  CO2 22 - 32 mmol/L 21(L) 21(L) 20(L)  Calcium 8.9 -  10.3 mg/dL 8.9 9.1 8.7(L)  Total Protein 6.5 - 8.1 g/dL 7.0 7.1 7.0  Total Bilirubin 0.3 - 1.2 mg/dL 0.4 0.4 0.3  Alkaline Phos 38 - 126 U/L 65 71 72  AST 15 - 41 U/L 11(L) 10(L) 10(L)  ALT 0 - 44 U/L 11 9 8    07/02/2019 Bone Marrow Biopsy ([id number]) at [facility]    08/07/18 BM Bx:       RADIOGRAPHIC STUDIES: I have personally reviewed the radiological images as listed and agreed with the findings in the report. No results found.  ASSESSMENT & PLAN:   64 y.o. 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   Initial BM Bx showed >95% involvement with kappa restricted serum free light chains. 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 MoL Cy-   08/07/18 BM Bx revealed normocellular bone marrow with trilineage hematopoiesis and 1% plasma cells which indicates the pt is in remission 08/06/18 PET/CT revealed  Expansile lytic lesion involving the left iliac bone, unchanged. Lytic lesion involving the sternum, unchanged. While both are suspicious for myeloma, neither lesion is FDG avid. No hypermetabolic osseous lesions in the visualized axial and appendicular skeleton. Please note that the bilateral lower extremities were not imaged. No suspicious lymphadenopathy. Spleen is normal in size.    Pt began Ninlaro 69m maintenance after pt finished 13 cycles of CyBorD and BM bx showed only 1% plasma cells, then resumed CyBorD and held Ninlaro in anticipation of his tandem bone marrow transplants [facility] in late December 2019 [facility]  11/19/18 High dose chemotherapy with Melphalan 1438mm2 and autologous stem cell rescue  03/20/19 High dose  chemotherapy with  Melphalan 276m/m2 and autologous stem cell rescue. Non-infectious diarrhea, uncomplicated course otherwise.  07/02/2019 PET scan with results revealing "1. No FDG avid bone lesions are identified. 2.  Asymmetric uptake within the right, greater than left, palatine tonsils with mild fullness of the right tonsillar soft tissue. ENT consult vs attention on follow up suggested. 3.  Innumerable mixed sclerotic and lytic lesions throughout the axial and appendicular skeleton without associated hypermetabolic activity. 4.  Ancillary CT findings as above."  07/02/2019 bone marrow biopsy ([id number])with results showing: "Trilineage hematopoieses with maturation. No plasma cell myeloma identified. Mild anemia and No Rouleaux or circulating plasma cells identified in the peripheral blood."  2) s/p Prophylactic IM nailing for rt femur  [facility]  -patient previously reported improvement and is currently progressively back to full time work.  3) Thrombocytopenia resolved   4) Renal insufficiency -- likely primarily related to Myeloma multifactorial MM/TLS/sepsis/antibiotics.Primary light chain nephrology Plan -maintain follow up with nephrology referral to Dr CMarval Regal/Narda AmberKidney for continued treatment -Pt completed 8 weeks of maverick for hep c with Dr CLinus Salmonson 07/08/18.  -Kidney numbers improved. Creatinine in the 1.7-2.2 range   5) h/o tobacco abuse-counseled on smoking cessation -Currently not smoking.  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 -Hgb improved to 13.4   -monitor   From [facility]a [facility]ricksonat [facility]  "Recommendations of care for local oncologist:  Dental evaluation twice a year to prevent increased risk of cavities and gum retraction is recommended. It is recommended that the dentist apply fluoride on one of the visits each year.   An  annual evaluation with an eye specialist is important to check for possible cataracts formed from all the steroids used with treatment.   A bone density scan every 2 years will monitor for risk of osteoporosis. One of the most important things to focus on is exercise and healthy eating. Joining a program like the [employer] can help regain the strength in the legs that will give a stirdier walk and prevent falls. It is important to do an aerobic exercise suitable for the degree of neurophathy and weakness. Water exercises or recumbent bicycles may be good options. Exercise will also help with neuropathy and reduce the risk of cardiovascular complications from transplant.   Maintenance therapy should be started with lenalidomide 10 mg daily, 21 of 28 days and carfilzomib on days 1 and 15.  Monthly labs with CBC, CMP, SPEP, UPEP, quantitative immunoglobulins, and serum free light chains are recommended for the remainder of the first year post-transplant.  Vaccinations will be started after the 6 month visit with [place]  Between now and the next post-transplant visit, the patient should continue care with a PCP and remain on antiviral prophylactic antibiotics for a whole year. Bactrim or Dapsone should be continued until 6 months post-transplant. Acyclovir will be given for a total of 1 year from day of transplant. If the patient or their doctor suspects a treatment related event they should call right away. We will continue to see TToma Aranevery 3 months to monitor progress for the first year."   PLAN: -Discussed pt labwork today, blood counts and blood chemistries are steady. -Discussed 12/26/2019 MMP - with no M spike -Discussed 12/26/2019 K/L light chains stable ratio 1.95. No increase in light chains. -The pt has no prohibitive toxicities  from continuing C5D1 of maintainence Carfilzomib at this time  -Continue Acyclovir and Aspirin as prescribed -continue maintenance  lenalidomide -Recommended pt receive COVID19 vaccine when available   FOLLOW UP: Follow-up with next 2 cycles of carfilzomib every 2 weeks with labs and port flush and MD visit every 4 weeks as currently scheduled.   . The total time spent in the appointment was 30 minutes and more than 50% was on counseling and direct patient cares.   All of the patient's questions were answered with apparent satisfaction. The patient knows to call the clinic with any problems, questions or concerns.   Sullivan Lone MD [place] AAHIVMS [facility] Hematology/Oncology Physician [facility]  (Office):       [phone] (Work cell):  [phone] (Fax):           [phone]

## 2020-02-06 ENCOUNTER — Inpatient Hospital Stay: Payer: BC Managed Care – PPO

## 2020-02-06 ENCOUNTER — Other Ambulatory Visit: Payer: Self-pay

## 2020-02-06 VITALS — BP 137/91 | HR 79 | Temp 98.3°F | Resp 18

## 2020-02-06 DIAGNOSIS — C9010 Plasma cell leukemia not having achieved remission: Secondary | ICD-10-CM | POA: Diagnosis not present

## 2020-02-06 DIAGNOSIS — C9011 Plasma cell leukemia in remission: Secondary | ICD-10-CM

## 2020-02-06 DIAGNOSIS — Z5111 Encounter for antineoplastic chemotherapy: Secondary | ICD-10-CM

## 2020-02-06 DIAGNOSIS — Z7189 Other specified counseling: Secondary | ICD-10-CM

## 2020-02-06 LAB — CMP (CANCER CENTER ONLY)
ALT: 14 U/L (ref 0–44)
AST: 12 U/L — ABNORMAL LOW (ref 15–41)
Albumin: 4.1 g/dL (ref 3.5–5.0)
Alkaline Phosphatase: 65 U/L (ref 38–126)
Anion gap: 9 (ref 5–15)
BUN: 25 mg/dL — ABNORMAL HIGH (ref 8–23)
CO2: 23 mmol/L (ref 22–32)
Calcium: 8.7 mg/dL — ABNORMAL LOW (ref 8.9–10.3)
Chloride: 109 mmol/L (ref 98–111)
Creatinine: 1.58 mg/dL — ABNORMAL HIGH (ref 0.61–1.24)
GFR, Est AFR Am: 54 mL/min — ABNORMAL LOW (ref >60)
GFR, Estimated: 46 mL/min — ABNORMAL LOW (ref >60)
Glucose, Bld: 91 mg/dL (ref 70–99)
Potassium: 4.5 mmol/L (ref 3.5–5.1)
Sodium: 141 mmol/L (ref 135–145)
Total Bilirubin: 0.4 mg/dL (ref 0.3–1.2)
Total Protein: 7.1 g/dL (ref 6.5–8.1)

## 2020-02-06 LAB — CBC WITH DIFFERENTIAL/PLATELET
Abs Immature Granulocytes: 0.06 10*3/uL (ref 0.00–0.07)
Basophils Absolute: 0.1 10*3/uL (ref 0.0–0.1)
Basophils Relative: 1 %
Eosinophils Absolute: 0.2 10*3/uL (ref 0.0–0.5)
Eosinophils Relative: 3 %
HCT: 39.6 % (ref 39.0–52.0)
Hemoglobin: 13.3 g/dL (ref 13.0–17.0)
Immature Granulocytes: 1 %
Lymphocytes Relative: 19 %
Lymphs Abs: 1.5 10*3/uL (ref 0.7–4.0)
MCH: 31.5 pg (ref 26.0–34.0)
MCHC: 33.6 g/dL (ref 30.0–36.0)
MCV: 93.8 fL (ref 80.0–100.0)
Monocytes Absolute: 0.6 10*3/uL (ref 0.1–1.0)
Monocytes Relative: 8 %
Neutro Abs: 5.5 10*3/uL (ref 1.7–7.7)
Neutrophils Relative %: 68 %
Platelets: 250 10*3/uL (ref 150–400)
RBC: 4.22 MIL/uL (ref 4.22–5.81)
RDW: 14.6 % (ref 11.5–15.5)
WBC: 8 10*3/uL (ref 4.0–10.5)
nRBC: 0 % (ref 0.0–0.2)

## 2020-02-06 MED ORDER — SODIUM CHLORIDE 0.9 % IV SOLN
Freq: Once | INTRAVENOUS | Status: AC
  Filled 2020-02-06: qty 250

## 2020-02-06 MED ORDER — SODIUM CHLORIDE 0.9 % IV SOLN
INTRAVENOUS | Status: AC
  Filled 2020-02-06: qty 250

## 2020-02-06 MED ORDER — SODIUM CHLORIDE 0.9 % IV SOLN
20.0000 mg | Freq: Once | INTRAVENOUS | Status: AC
  Administered 2020-02-06: 20 mg via INTRAVENOUS
  Filled 2020-02-06: qty 20

## 2020-02-06 MED ORDER — DEXTROSE 5 % IV SOLN
54.0000 mg/m2 | Freq: Once | INTRAVENOUS | Status: AC
  Administered 2020-02-06: 100 mg via INTRAVENOUS
  Filled 2020-02-06: qty 15

## 2020-02-06 MED ORDER — FAMOTIDINE 20 MG PO TABS
20.0000 mg | ORAL_TABLET | Freq: Once | ORAL | Status: AC
  Administered 2020-02-06: 20 mg via ORAL

## 2020-02-06 MED ORDER — ACETAMINOPHEN 325 MG PO TABS
650.0000 mg | ORAL_TABLET | Freq: Once | ORAL | Status: AC
  Administered 2020-02-06: 650 mg via ORAL

## 2020-02-06 MED ORDER — ACETAMINOPHEN 325 MG PO TABS
ORAL_TABLET | ORAL | Status: AC
  Filled 2020-02-06: qty 2

## 2020-02-06 MED ORDER — FAMOTIDINE 20 MG PO TABS
ORAL_TABLET | ORAL | Status: AC
  Filled 2020-02-06: qty 1

## 2020-02-06 NOTE — Addendum Note (Signed)
Addended by: Manuella Ghazi on: 02/06/2020 10:33 AM   Modules accepted: Orders

## 2020-02-06 NOTE — Patient Instructions (Signed)
[facility] Discharge Instructions for Patients Receiving Chemotherapy  Today you received the following chemotherapy agents :  carfilzomib (Kyprolis)  To help prevent nausea and vomiting after your treatment, we encourage you to take your nausea medication as prescribed.   If you develop nausea and vomiting that is not controlled by your nausea medication, call the clinic.   BELOW ARE SYMPTOMS THAT SHOULD BE REPORTED IMMEDIATELY:  *FEVER GREATER THAN 100.5 F  *CHILLS WITH OR WITHOUT FEVER  NAUSEA AND VOMITING THAT IS NOT CONTROLLED WITH YOUR NAUSEA MEDICATION  *UNUSUAL SHORTNESS OF BREATH  *UNUSUAL BRUISING OR BLEEDING  TENDERNESS IN MOUTH AND THROAT WITH OR WITHOUT PRESENCE OF ULCERS  *URINARY PROBLEMS  *BOWEL PROBLEMS  UNUSUAL RASH Items with * indicate a potential emergency and should be followed up as soon as possible.  Feel free to call the clinic should you have any questions or concerns. The clinic phone number is [phone].  Please show the CHEMO ALERT CARD at check-in to the Emergency Department and triage nurse.   

## 2020-02-06 NOTE — Progress Notes (Signed)
Per Dr. Irene Limbo it is ok to treat with Kyprolis today with creatinine 1.58

## 2020-02-17 ENCOUNTER — Telehealth: Payer: Self-pay

## 2020-02-17 ENCOUNTER — Other Ambulatory Visit: Payer: Self-pay

## 2020-02-17 DIAGNOSIS — C9010 Plasma cell leukemia not having achieved remission: Secondary | ICD-10-CM

## 2020-02-17 MED ORDER — LENALIDOMIDE 10 MG PO CAPS: 10.0000 mg | ORAL_CAPSULE | Freq: Every day | ORAL | 0 refills | Status: DC

## 2020-02-17 NOTE — Telephone Encounter (Signed)
Revlimid refill completed and faxed to [facility] pharmacy, confirmation received. 

## 2020-02-19 NOTE — Progress Notes (Signed)
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 His labs today 12/14/2017 show hgb 8.3, platelets 524k. He is doing well overall. He has staples in his right upper leg following a recent surgery and has not been in touch with the surgeons for a f/u of this. He is not receiving physical therapy and is not getting around at home.  He notes that he was not scheduled for a post hospitalization nephrology f/u and has no PCP.  On review of systems, pt reports intermittent fatigue, SOB, pain to the left leg, dizziness, dry mouth and denies fever, chills, night sweats, dysuria and any other accompanying symptoms.     INTERVAL HISTORY:    Andres Fernandez presents today for follow up and continue management of his myeloma/plasma cell leukemia. The patient's last visit with [name] was on 01/23/2020. The pt reports that he is doing well overall.  The pt reports he has been doing good. He has been cramping in hands and arches of feet. Pt has not gotten COVID19 vaccine. He is now working 5 days a week.   Lab results today (02/20/20) of CBC w/diff and CMP is as follows: all values are WNL except for CO2 at 20, BUN at 28, Creatinine at 1.78, AST at 10, GFR, Est Non Af Am at 40, GFR, Est AFR Am at 46.  02/20/2020 of MMP - no m spike 02/20/2020 of Kappa/lambda light chains normal K/L ratio  On review of systems, pt denies fever, infection issues, back pain and any other symptoms.    REVIEW OF SYSTEMS:   A 10+ POINT REVIEW OF SYSTEMS WAS OBTAINED including neurology, dermatology, psychiatry, cardiac, respiratory, lymph, extremities, GI, GU, Musculoskeletal, constitutional, breasts, reproductive, HEENT.  All pertinent positives are noted in the HPI.  All others are  negative.   MEDICAL HISTORY:  Past Medical History:  Diagnosis Date  . BPH (benign prostatic hyperplasia)   . Hepatitis C 11/27/2017  . Hypertension   . Plasma cell leukemia ([place]) 11/23/2017  . Tobacco abuse     SURGICAL HISTORY: Past Surgical History:  Procedure Laterality Date  . APPENDECTOMY    . FEMUR IM NAIL Right 11/20/2017   Procedure: RIGHT FEMORAL INTRAMEDULLARY (IM) NAIL;  Surgeon: Nicholes Stairs, MD;  Location: [facility];  Service: Orthopedics;  Laterality: Right;    SOCIAL HISTORY: Social History   Socioeconomic History  . Marital status: Single    Spouse name: Not on file  . Number of children: Not on file  . Years of education: Not on file  . Highest education level: Not on file  Occupational History  . Not on file  Tobacco Use  . Smoking status: Current Every Day Smoker    Packs/day: 0.25    Types: Cigarettes    Last attempt to quit: 11/09/2017    Years since quitting: 2.2  . Smokeless tobacco: Never Used  . Tobacco comment: 1 cigarette/day as of 09/25/18  Substance and Sexual Activity  . Alcohol use: Yes  . Drug use: No  . Sexual activity: Not on file  Other Topics Concern  . Not on file  Social History Narrative  . Not on file   Social Determinants of Health   Financial Resource Strain:   . Difficulty of Paying Living  Expenses:   Food Insecurity:   . Worried About Charity fundraiser in the Last Year:   . Arboriculturist in the Last Year:   Transportation Needs:   . Film/video editor (Medical):   [name] Lack of Transportation (Non-Medical):   Physical Activity:   . Days of Exercise per Week:   . Minutes of Exercise per Session:   Stress:   . Feeling of Stress :   Social Connections:   . Frequency of Communication with Friends and Family:   . Frequency of Social Gatherings with Friends and Family:   . Attends Religious Services:   . Active Member of Clubs or Organizations:   . Attends Archivist Meetings:   [name] Marital  Status:   Intimate Partner Violence:   . Fear of Current or Ex-Partner:   . Emotionally Abused:   [name] Physically Abused:   . Sexually Abused:     FAMILY HISTORY: Family History  Problem Relation Age of Onset  . COPD Mother   . Liver cancer Mother     ALLERGIES:  has No Known Allergies.  MEDICATIONS:  . Current Outpatient Medications on File Prior to Visit  Medication Sig Dispense Refill  . acyclovir (ZOVIRAX) 400 MG tablet Take 1 tablet (400 mg total) by mouth daily. 30 tablet 11  . aspirin EC 81 MG tablet Take 1 tablet (81 mg total) by mouth daily. 60 tablet 11  . cholecalciferol (VITAMIN D) 400 units TABS tablet Take 400 Units by mouth daily.    [name] dexamethasone (DECADRON) 4 MG tablet Take 5 tablets (20 mg total) by mouth once a week. (Patient not taking: Reported on 08/27/2019) 20 tablet 1  . ergocalciferol (VITAMIN D2) 1.25 MG (50000 UT) capsule Take 1 capsule (50,000 Units total) by mouth once a week. (Patient not taking: Reported on 01/23/2020) 12 capsule 6  . lenalidomide (REVLIMID) 10 MG capsule Take 1 capsule (10 mg total) by mouth daily. Take for 21 days on, 7 days off, repeat every 28 days. Adult male: Auth # [id number]; Date 02/17/2020 21 capsule 0  . Multiple Vitamins-Minerals (MULTIVITAMIN WITH MINERALS) tablet Take 1 tablet by mouth daily.    . nicotine (NICODERM CQ - DOSED IN MG/24 HOURS) 14 mg/24hr patch PLACE 1 PATCH (14 MG TOTAL) ONTO THE SKIN DAILY. 28 patch 1  . ondansetron (ZOFRAN) 4 MG tablet Take 2 tablets (8 mg total) by mouth every 8 (eight) hours as needed for nausea. (Patient not taking: Reported on 01/23/2020) 30 tablet 0   No current facility-administered medications on file prior to visit.    PHYSICAL EXAMINATION:  ECOG FS:1 - Symptomatic but completely ambulatory  There were no vitals filed for this visit. Wt Readings from Last 3 Encounters:  01/23/20 160 lb 1.6 oz (72.6 kg)  12/26/19 159 lb 4.8 oz (72.3 kg)  11/28/19 164 lb 8 oz (74.6 kg)   Body  mass index is 24.8 kg/m.     GENERAL:alert, in no acute distress and comfortable SKIN: no acute rashes, no significant lesions EYES: conjunctiva are pink and non-injected, sclera anicteric OROPHARYNX: MMM, no exudates, no oropharyngeal erythema or ulceration NECK: supple, no JVD LYMPH:  no palpable lymphadenopathy in the cervical, axillary or inguinal regions LUNGS: clear to auscultation b/l with normal respiratory effort HEART: regular rate & rhythm ABDOMEN:  normoactive bowel sounds , non tender, not distended. Extremity: no pedal edema PSYCH: alert & oriented x 3 with fluent speech NEURO: no focal motor/sensory deficits  LABORATORY DATA:  I have reviewed the data as listed  . CBC Latest Ref Rng & Units 02/20/2020 02/06/2020 01/23/2020  WBC 4.0 - 10.5 K/uL 6.1 8.0 7.8  Hemoglobin 13.0 - 17.0 g/dL 13.9 13.3 13.4  Hematocrit 39.0 - 52.0 % 41.5 39.6 39.7  Platelets 150 - 400 K/uL 252 250 227   . CBC    Component Value Date/Time   WBC 6.1 02/20/2020 0840   RBC 4.48 02/20/2020 0840   HGB 13.9 02/20/2020 0840   HGB 11.9 (L) 08/27/2019 0922   HGB 8.3 (L) 12/14/2017 1230   HCT 41.5 02/20/2020 0840   HCT 23.9 (L) 12/14/2017 1230   PLT 252 02/20/2020 0840   PLT 203 08/27/2019 0922   PLT 524 (H) 12/14/2017 1230   MCV 92.6 02/20/2020 0840   MCV 88.4 12/14/2017 1230   MCH 31.0 02/20/2020 0840   MCHC 33.5 02/20/2020 0840   RDW 14.8 02/20/2020 0840   RDW 15.3 (H) 12/14/2017 1230   LYMPHSABS 1.3 02/20/2020 0840   LYMPHSABS 1.4 12/14/2017 1230   MONOABS 0.4 02/20/2020 0840   MONOABS 0.6 12/14/2017 1230   EOSABS 0.2 02/20/2020 0840   EOSABS 0.1 12/14/2017 1230   BASOSABS 0.0 02/20/2020 0840   BASOSABS 0.1 12/14/2017 1230     . CMP Latest Ref Rng & Units 02/20/2020 02/06/2020 01/23/2020  Glucose 70 - 99 mg/dL 98 91 110(H)  BUN 8 - 23 mg/dL 28(H) 25(H) 27(H)  Creatinine 0.61 - 1.24 mg/dL 1.78(H) 1.58(H) 1.72(H)  Sodium 135 - 145 mmol/L 140 141 143  Potassium 3.5 - 5.1 mmol/L  4.6 4.5 4.2  Chloride 98 - 111 mmol/L 109 109 111  CO2 22 - 32 mmol/L 20(L) 23 21(L)  Calcium 8.9 - 10.3 mg/dL 9.1 8.7(L) 8.9  Total Protein 6.5 - 8.1 g/dL 7.4 7.1 7.0  Total Bilirubin 0.3 - 1.2 mg/dL 0.4 0.4 0.4  Alkaline Phos 38 - 126 U/L 83 65 65  AST 15 - 41 U/L 10(L) 12(L) 11(L)  ALT 0 - 44 U/L 10 14 11    07/02/2019 Bone Marrow Biopsy ([id number]) at [facility]    08/07/18 BM Bx:       RADIOGRAPHIC STUDIES: I have personally reviewed the radiological images as listed and agreed with the findings in the report. No results found.  ASSESSMENT & PLAN:   64 y.o. 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   Initial BM Bx showed >95% involvement with kappa restricted serum free light chains. 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 MoL Cy-   08/07/18 BM Bx revealed normocellular bone marrow with trilineage hematopoiesis and 1% plasma cells which indicates the pt is in remission 08/06/18 PET/CT revealed  Expansile lytic lesion involving the left iliac bone, unchanged. Lytic lesion involving the sternum, unchanged. While both are suspicious for myeloma, neither lesion is FDG avid. No hypermetabolic osseous lesions in the visualized axial and appendicular skeleton. Please note that the bilateral lower extremities were not imaged. No suspicious lymphadenopathy. Spleen is normal in size.    Pt began Ninlaro 54m maintenance after pt finished 13 cycles of CyBorD and BM bx showed only 1% plasma cells, then resumed CyBorD and held Ninlaro in anticipation of his tandem bone marrow transplants beginning in  late December 2019 with Dr. Blinda Leatherwood at [facility]   11/19/18 High dose  chemotherapy with Melphalan 122m/m2 and autologous stem cell rescue  03/20/19 High dose chemotherapy with Melphalan 2025mm2 and autologous stem cell rescue. Non-infectious diarrhea, uncomplicated course otherwise.  07/02/2019 PET scan with results revealing "1. No FDG avid bone lesions are identified. 2.  Asymmetric uptake within the right, greater than left, palatine tonsils with mild fullness of the right tonsillar soft tissue. ENT consult vs attention on follow up suggested. 3.  Innumerable mixed sclerotic and lytic lesions throughout the axial and appendicular skeleton without associated hypermetabolic activity. 4.  Ancillary CT findings as above."  07/02/2019 bone marrow biopsy ([id number])with results showing: "Trilineage hematopoieses with maturation. No plasma cell myeloma identified. Mild anemia and No Rouleaux or circulating plasma cells identified in the peripheral blood."  2) s/p Prophylactic IM nailing for rt femur  [facility]patient previously reported improvement and is currently progressively back to full time work.  3) Thrombocytopenia resolved   4) Renal insufficiency -- likely primarily related to Myeloma multifactorial MM/TLS/sepsis/antibiotics.Primary light chain nephrology Plan -maintain follow up with nephrology referral to Dr CoMarval RegalCNarda Amberidney for continued treatment -Pt completed 8 weeks of maverick for hep c with Dr CoLinus Salmonsn 07/08/18.  -Kidney numbers improved. Creatinine in the 1.7-2.2 range   5) h/o tobacco abuse-counseled on smoking cessation -Currently not smoking.  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 -Hgb improved to 13.4   -monitor   From Dr [facility]a [facility] "Recommendations of care for local oncologist:  Dental evaluation twice a year to prevent increased risk of cavities and gum retraction  is recommended. It is recommended that the dentist apply fluoride on one of the visits each year.   An annual evaluation with an eye specialist is important to check for possible cataracts formed from all the steroids used with treatment.   A bone density scan every 2 years will monitor for risk of osteoporosis. One of the most important things to focus on is exercise and healthy eating. Joining a program like the [employer] the [employer] can help regain the strength in the legs that will give a stirdier walk and prevent falls. It is important to do an aerobic exercise suitable for the degree of neurophathy and weakness. Water exercises or recumbent bicycles may be good options. Exercise will also help with neuropathy and reduce the risk of cardiovascular complications from transplant.   Maintenance therapy should be started with lenalidomide 10 mg daily, 21 of 28 days and carfilzomib on days 1 and 15.  Monthly labs with CBC, CMP, SPEP, UPEP, quantitative immunoglobulins, and serum free light chains are recommended for the remainder of the first year post-transplant.  Vaccinations will be started after the 6 month visit with [place] Between now and the next post-transplant visit, the patient should continue care with a PCP and remain on antiviral prophylactic antibiotics for a whole year. Bactrim or Dapsone should be continued until 6 months post-transplant. Acyclovir will be given for a total of 1 year from day of transplant. If the patient or their doctor suspects a treatment related event they should call right away. We will continue to see Andres Fernandez 3 months to monitor progress for the first year."   PLAN: -Discussed pt labwork today, 02/20/20; of CBC w/diff and CMP is  as follows: all values are WNL except for CO2 at 20, BUN at 28, Creatinine at 1.78, AST at 10, GFR, Est Non Af Am at 40, GFR, Est AFR Am at 46.  -Discussed 02/20/2020 of MMP no Mspike -Discussed 02/20/2020 of  Kappa/lambda light chains normal K/L ratio -Advised on medications/lenalidomide can cause cramping -Advised on COVID19 vaccine and which to take  -Recommends getting any COVID19 vaccination - but f/u with transplant team to confirm- usually 120 days after transplant  -The pt has no prohibitive toxicities from continuing maintainence Carfilzomib at this time  -continue Lenalidomide maintenance    FOLLOW UP: Follow-up with next 2 cycles (4 doses) of carfilzomib every 2 weeks with labs and port flush MD visit every 4 weeks   All of the patient's questions were answered with apparent satisfaction. The patient knows to call the clinic with any problems, questions or concerns.   Sullivan Lone MD [place] AAHIVMS [facility] Hematology/Oncology Physician [facility]  (Office):       [phone] (Work cell):  [phone] (Fax):           [phone]  I, Dawayne Cirri am acting as a scribe for Dr. Sullivan Lone.   .I have reviewed the above documentation for accuracy and completeness, and I agree with the above. Brunetta Genera MD

## 2020-02-20 ENCOUNTER — Inpatient Hospital Stay (HOSPITAL_BASED_OUTPATIENT_CLINIC_OR_DEPARTMENT_OTHER): Payer: BC Managed Care – PPO | Admitting: Hematology

## 2020-02-20 ENCOUNTER — Inpatient Hospital Stay: Payer: BC Managed Care – PPO

## 2020-02-20 ENCOUNTER — Inpatient Hospital Stay: Payer: BC Managed Care – PPO | Attending: Hematology

## 2020-02-20 ENCOUNTER — Other Ambulatory Visit: Payer: Self-pay

## 2020-02-20 VITALS — BP 123/82 | HR 71 | Temp 98.7°F | Resp 18 | Ht 68.0 in | Wt 163.1 lb

## 2020-02-20 DIAGNOSIS — Z7189 Other specified counseling: Secondary | ICD-10-CM

## 2020-02-20 DIAGNOSIS — Z9484 Stem cells transplant status: Secondary | ICD-10-CM | POA: Diagnosis not present

## 2020-02-20 DIAGNOSIS — C9010 Plasma cell leukemia not having achieved remission: Secondary | ICD-10-CM | POA: Diagnosis not present

## 2020-02-20 DIAGNOSIS — Z5111 Encounter for antineoplastic chemotherapy: Secondary | ICD-10-CM | POA: Diagnosis not present

## 2020-02-20 LAB — CMP (CANCER CENTER ONLY)
ALT: 10 U/L (ref 0–44)
AST: 10 U/L — ABNORMAL LOW (ref 15–41)
Albumin: 4 g/dL (ref 3.5–5.0)
Alkaline Phosphatase: 83 U/L (ref 38–126)
Anion gap: 11 (ref 5–15)
BUN: 28 mg/dL — ABNORMAL HIGH (ref 8–23)
CO2: 20 mmol/L — ABNORMAL LOW (ref 22–32)
Calcium: 9.1 mg/dL (ref 8.9–10.3)
Chloride: 109 mmol/L (ref 98–111)
Creatinine: 1.78 mg/dL — ABNORMAL HIGH (ref 0.61–1.24)
GFR, Est AFR Am: 46 mL/min — ABNORMAL LOW (ref >60)
GFR, Estimated: 40 mL/min — ABNORMAL LOW (ref >60)
Glucose, Bld: 98 mg/dL (ref 70–99)
Potassium: 4.6 mmol/L (ref 3.5–5.1)
Sodium: 140 mmol/L (ref 135–145)
Total Bilirubin: 0.4 mg/dL (ref 0.3–1.2)
Total Protein: 7.4 g/dL (ref 6.5–8.1)

## 2020-02-20 LAB — CBC WITH DIFFERENTIAL/PLATELET
Abs Immature Granulocytes: 0.02 10*3/uL (ref 0.00–0.07)
Basophils Absolute: 0 10*3/uL (ref 0.0–0.1)
Basophils Relative: 1 %
Eosinophils Absolute: 0.2 10*3/uL (ref 0.0–0.5)
Eosinophils Relative: 4 %
HCT: 41.5 % (ref 39.0–52.0)
Hemoglobin: 13.9 g/dL (ref 13.0–17.0)
Immature Granulocytes: 0 %
Lymphocytes Relative: 21 %
Lymphs Abs: 1.3 10*3/uL (ref 0.7–4.0)
MCH: 31 pg (ref 26.0–34.0)
MCHC: 33.5 g/dL (ref 30.0–36.0)
MCV: 92.6 fL (ref 80.0–100.0)
Monocytes Absolute: 0.4 10*3/uL (ref 0.1–1.0)
Monocytes Relative: 7 %
Neutro Abs: 4.1 10*3/uL (ref 1.7–7.7)
Neutrophils Relative %: 67 %
Platelets: 252 10*3/uL (ref 150–400)
RBC: 4.48 MIL/uL (ref 4.22–5.81)
RDW: 14.8 % (ref 11.5–15.5)
WBC: 6.1 10*3/uL (ref 4.0–10.5)
nRBC: 0 % (ref 0.0–0.2)

## 2020-02-20 MED ORDER — SODIUM CHLORIDE 0.9 % IV SOLN
20.0000 mg | Freq: Once | INTRAVENOUS | Status: AC
  Administered 2020-02-20: 20 mg via INTRAVENOUS
  Filled 2020-02-20: qty 20

## 2020-02-20 MED ORDER — FAMOTIDINE 20 MG PO TABS
ORAL_TABLET | ORAL | Status: AC
  Filled 2020-02-20: qty 1

## 2020-02-20 MED ORDER — DEXTROSE 5 % IV SOLN
54.0000 mg/m2 | Freq: Once | INTRAVENOUS | Status: AC
  Administered 2020-02-20: 100 mg via INTRAVENOUS
  Filled 2020-02-20: qty 30

## 2020-02-20 MED ORDER — ACETAMINOPHEN 325 MG PO TABS
ORAL_TABLET | ORAL | Status: AC
  Filled 2020-02-20: qty 2

## 2020-02-20 MED ORDER — ACETAMINOPHEN 325 MG PO TABS
650.0000 mg | ORAL_TABLET | Freq: Once | ORAL | Status: AC
  Administered 2020-02-20: 650 mg via ORAL

## 2020-02-20 MED ORDER — SODIUM CHLORIDE 0.9 % IV SOLN
Freq: Once | INTRAVENOUS | Status: AC
  Filled 2020-02-20: qty 250

## 2020-02-20 MED ORDER — FAMOTIDINE 20 MG PO TABS
20.0000 mg | ORAL_TABLET | Freq: Once | ORAL | Status: AC
  Administered 2020-02-20: 20 mg via ORAL

## 2020-02-20 MED ORDER — SODIUM CHLORIDE 0.9 % IV SOLN
INTRAVENOUS | Status: AC
  Filled 2020-02-20: qty 250

## 2020-02-20 NOTE — Patient Instructions (Signed)
[facility] Discharge Instructions for Patients Receiving Chemotherapy  Today you received the following chemotherapy agents :  carfilzomib (Kyprolis)  To help prevent nausea and vomiting after your treatment, we encourage you to take your nausea medication as prescribed.   If you develop nausea and vomiting that is not controlled by your nausea medication, call the clinic.   BELOW ARE SYMPTOMS THAT SHOULD BE REPORTED IMMEDIATELY:  *FEVER GREATER THAN 100.5 F  *CHILLS WITH OR WITHOUT FEVER  NAUSEA AND VOMITING THAT IS NOT CONTROLLED WITH YOUR NAUSEA MEDICATION  *UNUSUAL SHORTNESS OF BREATH  *UNUSUAL BRUISING OR BLEEDING  TENDERNESS IN MOUTH AND THROAT WITH OR WITHOUT PRESENCE OF ULCERS  *URINARY PROBLEMS  *BOWEL PROBLEMS  UNUSUAL RASH Items with * indicate a potential emergency and should be followed up as soon as possible.  Feel free to call the clinic should you have any questions or concerns. The clinic phone number is [phone].  Please show the CHEMO ALERT CARD at check-in to the Emergency Department and triage nurse.   

## 2020-02-23 LAB — KAPPA/LAMBDA LIGHT CHAINS
Kappa free light chain: 47.6 mg/L — ABNORMAL HIGH (ref 3.3–19.4)
Kappa, lambda light chain ratio: 1.55 (ref 0.26–1.65)
Lambda free light chains: 30.8 mg/L — ABNORMAL HIGH (ref 5.7–26.3)

## 2020-02-24 LAB — MULTIPLE MYELOMA PANEL, SERUM
Albumin SerPl Elph-Mcnc: 4 g/dL (ref 2.9–4.4)
Albumin/Glob SerPl: 1.3 (ref 0.7–1.7)
Alpha 1: 0.3 g/dL (ref 0.0–0.4)
Alpha2 Glob SerPl Elph-Mcnc: 1.3 g/dL — ABNORMAL HIGH (ref 0.4–1.0)
B-Globulin SerPl Elph-Mcnc: 1 g/dL (ref 0.7–1.3)
Gamma Glob SerPl Elph-Mcnc: 0.5 g/dL (ref 0.4–1.8)
Globulin, Total: 3.1 g/dL (ref 2.2–3.9)
IgA: 74 mg/dL (ref 61–437)
IgG (Immunoglobin G), Serum: 698 mg/dL (ref 603–1613)
IgM (Immunoglobulin M), Srm: 8 mg/dL — ABNORMAL LOW (ref 20–172)
Total Protein ELP: 7.1 g/dL (ref 6.0–8.5)

## 2020-02-25 ENCOUNTER — Telehealth: Payer: Self-pay | Admitting: Hematology

## 2020-02-25 NOTE — Telephone Encounter (Signed)
Scheduled per 03/12 los, patient has been called and notified of upcoming appointments.

## 2020-03-01 NOTE — Progress Notes (Signed)
Pharmacist Chemotherapy Monitoring - Follow Up Assessment    I verify that I have reviewed each item in the below checklist:  . Regimen for the patient is scheduled for the appropriate day and plan matches scheduled date. [name] Appropriate non-routine labs are ordered dependent on drug ordered. . If applicable, additional medications reviewed and ordered per protocol based on lifetime cumulative doses and/or treatment regimen.   Plan for follow-up and/or issues identified: No . I-vent associated with next due treatment: No . MD and/or nursing notified: No  [name] Course 03/01/2020 10:51 AM

## 2020-03-05 ENCOUNTER — Inpatient Hospital Stay: Payer: BC Managed Care – PPO

## 2020-03-05 ENCOUNTER — Telehealth: Payer: Self-pay

## 2020-03-05 ENCOUNTER — Other Ambulatory Visit: Payer: Self-pay

## 2020-03-05 VITALS — BP 116/80 | HR 80 | Temp 98.0°F | Resp 18

## 2020-03-05 DIAGNOSIS — C9010 Plasma cell leukemia not having achieved remission: Secondary | ICD-10-CM

## 2020-03-05 DIAGNOSIS — Z7189 Other specified counseling: Secondary | ICD-10-CM

## 2020-03-05 DIAGNOSIS — C9011 Plasma cell leukemia in remission: Secondary | ICD-10-CM

## 2020-03-05 DIAGNOSIS — Z5111 Encounter for antineoplastic chemotherapy: Secondary | ICD-10-CM

## 2020-03-05 LAB — CMP (CANCER CENTER ONLY)
ALT: 13 U/L (ref 0–44)
AST: 11 U/L — ABNORMAL LOW (ref 15–41)
Albumin: 3.9 g/dL (ref 3.5–5.0)
Alkaline Phosphatase: 67 U/L (ref 38–126)
Anion gap: 9 (ref 5–15)
BUN: 27 mg/dL — ABNORMAL HIGH (ref 8–23)
CO2: 18 mmol/L — ABNORMAL LOW (ref 22–32)
Calcium: 8.9 mg/dL (ref 8.9–10.3)
Chloride: 112 mmol/L — ABNORMAL HIGH (ref 98–111)
Creatinine: 1.81 mg/dL — ABNORMAL HIGH (ref 0.61–1.24)
GFR, Est AFR Am: 45 mL/min — ABNORMAL LOW (ref >60)
GFR, Estimated: 39 mL/min — ABNORMAL LOW (ref >60)
Glucose, Bld: 98 mg/dL (ref 70–99)
Potassium: 4.5 mmol/L (ref 3.5–5.1)
Sodium: 139 mmol/L (ref 135–145)
Total Bilirubin: 0.5 mg/dL (ref 0.3–1.2)
Total Protein: 7.1 g/dL (ref 6.5–8.1)

## 2020-03-05 LAB — CBC WITH DIFFERENTIAL/PLATELET
Abs Immature Granulocytes: 0.04 10*3/uL (ref 0.00–0.07)
Basophils Absolute: 0.1 10*3/uL (ref 0.0–0.1)
Basophils Relative: 2 %
Eosinophils Absolute: 0.3 10*3/uL (ref 0.0–0.5)
Eosinophils Relative: 4 %
HCT: 38.6 % — ABNORMAL LOW (ref 39.0–52.0)
Hemoglobin: 13.2 g/dL (ref 13.0–17.0)
Immature Granulocytes: 1 %
Lymphocytes Relative: 24 %
Lymphs Abs: 1.8 10*3/uL (ref 0.7–4.0)
MCH: 31.4 pg (ref 26.0–34.0)
MCHC: 34.2 g/dL (ref 30.0–36.0)
MCV: 91.7 fL (ref 80.0–100.0)
Monocytes Absolute: 0.8 10*3/uL (ref 0.1–1.0)
Monocytes Relative: 11 %
Neutro Abs: 4.4 10*3/uL (ref 1.7–7.7)
Neutrophils Relative %: 58 %
Platelets: 266 10*3/uL (ref 150–400)
RBC: 4.21 MIL/uL — ABNORMAL LOW (ref 4.22–5.81)
RDW: 15 % (ref 11.5–15.5)
WBC: 7.4 10*3/uL (ref 4.0–10.5)
nRBC: 0 % (ref 0.0–0.2)

## 2020-03-05 MED ORDER — ACETAMINOPHEN 325 MG PO TABS
ORAL_TABLET | ORAL | Status: AC
  Filled 2020-03-05: qty 2

## 2020-03-05 MED ORDER — FAMOTIDINE 20 MG PO TABS
20.0000 mg | ORAL_TABLET | Freq: Once | ORAL | Status: AC
  Administered 2020-03-05: 20 mg via ORAL

## 2020-03-05 MED ORDER — FAMOTIDINE 20 MG PO TABS
ORAL_TABLET | ORAL | Status: AC
  Filled 2020-03-05: qty 1

## 2020-03-05 MED ORDER — SODIUM CHLORIDE 0.9 % IV SOLN
20.0000 mg | Freq: Once | INTRAVENOUS | Status: AC
  Administered 2020-03-05: 20 mg via INTRAVENOUS
  Filled 2020-03-05: qty 20

## 2020-03-05 MED ORDER — FAMOTIDINE IN NACL 20-0.9 MG/50ML-% IV SOLN
INTRAVENOUS | Status: AC
  Filled 2020-03-05: qty 50

## 2020-03-05 MED ORDER — SODIUM CHLORIDE 0.9 % IV SOLN
INTRAVENOUS | Status: AC
  Filled 2020-03-05: qty 250

## 2020-03-05 MED ORDER — SODIUM CHLORIDE 0.9 % IV SOLN
Freq: Once | INTRAVENOUS | Status: AC
  Filled 2020-03-05: qty 250

## 2020-03-05 MED ORDER — DEXTROSE 5 % IV SOLN
100.0000 mg | Freq: Once | INTRAVENOUS | Status: AC
  Administered 2020-03-05: 100 mg via INTRAVENOUS
  Filled 2020-03-05: qty 30

## 2020-03-05 MED ORDER — ACETAMINOPHEN 325 MG PO TABS
650.0000 mg | ORAL_TABLET | Freq: Once | ORAL | Status: AC
  Administered 2020-03-05: 650 mg via ORAL

## 2020-03-05 NOTE — Telephone Encounter (Signed)
Per Dr. Lorenso Courier, ok to treat with Scr 1.81.

## 2020-03-05 NOTE — Patient Instructions (Signed)
[facility] Discharge Instructions for Patients Receiving Chemotherapy  Today you received the following chemotherapy agents:  Kyprolis  To help prevent nausea and vomiting after your treatment, we encourage you to take your nausea medication as directed.   If you develop nausea and vomiting that is not controlled by your nausea medication, call the clinic.   BELOW ARE SYMPTOMS THAT SHOULD BE REPORTED IMMEDIATELY:  *FEVER GREATER THAN 100.5 F  *CHILLS WITH OR WITHOUT FEVER  NAUSEA AND VOMITING THAT IS NOT CONTROLLED WITH YOUR NAUSEA MEDICATION  *UNUSUAL SHORTNESS OF BREATH  *UNUSUAL BRUISING OR BLEEDING  TENDERNESS IN MOUTH AND THROAT WITH OR WITHOUT PRESENCE OF ULCERS  *URINARY PROBLEMS  *BOWEL PROBLEMS  UNUSUAL RASH Items with * indicate a potential emergency and should be followed up as soon as possible.  Feel free to call the clinic should you have any questions or concerns. The clinic phone number is [phone].  Please show the CHEMO ALERT CARD at check-in to the Emergency Department and triage nurse.   

## 2020-03-08 LAB — KAPPA/LAMBDA LIGHT CHAINS
Kappa free light chain: 35.5 mg/L — ABNORMAL HIGH (ref 3.3–19.4)
Kappa, lambda light chain ratio: 1.35 (ref 0.26–1.65)
Lambda free light chains: 26.2 mg/L (ref 5.7–26.3)

## 2020-03-09 LAB — MULTIPLE MYELOMA PANEL, SERUM
Albumin SerPl Elph-Mcnc: 4 g/dL (ref 2.9–4.4)
Albumin/Glob SerPl: 1.5 (ref 0.7–1.7)
Alpha 1: 0.2 g/dL (ref 0.0–0.4)
Alpha2 Glob SerPl Elph-Mcnc: 1.1 g/dL — ABNORMAL HIGH (ref 0.4–1.0)
B-Globulin SerPl Elph-Mcnc: 0.9 g/dL (ref 0.7–1.3)
Gamma Glob SerPl Elph-Mcnc: 0.5 g/dL (ref 0.4–1.8)
Globulin, Total: 2.7 g/dL (ref 2.2–3.9)
IgA: 60 mg/dL — ABNORMAL LOW (ref 61–437)
IgG (Immunoglobin G), Serum: 645 mg/dL (ref 603–1613)
IgM (Immunoglobulin M), Srm: 7 mg/dL — ABNORMAL LOW (ref 20–172)
Total Protein ELP: 6.7 g/dL (ref 6.0–8.5)

## 2020-03-10 ENCOUNTER — Other Ambulatory Visit: Payer: Self-pay | Admitting: Hematology

## 2020-03-10 DIAGNOSIS — C9011 Plasma cell leukemia in remission: Secondary | ICD-10-CM

## 2020-03-10 DIAGNOSIS — Z9481 Bone marrow transplant status: Secondary | ICD-10-CM

## 2020-03-18 ENCOUNTER — Telehealth: Payer: Self-pay | Admitting: *Deleted

## 2020-03-18 NOTE — Telephone Encounter (Signed)
Received faxed request from [facility] Weyman Rodney, NP) asking Dr. Irene Limbo to order a Bone Density Scan for patient as part of collaborative care following bone marrow transplant. Dr. Irene Limbo ordered scan - it is authorized.  Contacted patient - left voice mail for patient with this information.Gave him [facility] Schedule number and asked him to contact and schedule scan when convenient for him.  [facility] requests results of scan be faxed to [phone]. Call Critical Values to [phone] (after 5 pm, call Critical Values to [phone] or call [phone] and ask for  Physician On Call.

## 2020-03-18 NOTE — Progress Notes (Signed)
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 His labs today 12/14/2017 show hgb 8.3, platelets 524k. He is doing well overall. He has staples in his right upper leg following a recent surgery and has not been in touch with the surgeons for a f/u of this. He is not receiving physical therapy and is not getting around at home.  He notes that he was not scheduled for a post hospitalization nephrology f/u and has no PCP.  On review of systems, pt reports intermittent fatigue, SOB, pain to the left leg, dizziness, dry mouth and denies fever, chills, night sweats, dysuria and any other accompanying symptoms.     INTERVAL HISTORY:    Andres Fernandez presents today for follow up and continue management of his myeloma/plasma cell leukemia. The patient's last visit with [name] was on 02/20/20. The pt reports that he is doing well overall.  The pt reports he is good. He is still having cramping in his hands, legs and feet. It happens about once a day and is more bothersome during night. He has been drinking pedialyte and taking a multi-vitamin. Pt has gotten first dose of COVID19 vaccine on April 7th.   Lab results today (03/19/20) of CBC w/diff and CMP is as follows: all values are WNL except for RBC at 4.01, Hemoglobin at 12.5, HCT at 37.2, CO2 at 21, BUN at 28, Creatinine at 1.98, Calcium at 8.7, AST at 8, GFR, Est Non Af Am at 35, GFR, Est AFR Am at 41.  On review of systems, pt denies abdominal pain and any other symptoms.    REVIEW OF SYSTEMS:   A 10+ POINT REVIEW OF SYSTEMS WAS OBTAINED including neurology, dermatology, psychiatry, cardiac, respiratory, lymph, extremities, GI, GU, Musculoskeletal, constitutional, breasts, reproductive, HEENT.  All  pertinent positives are noted in the HPI.  All others are negative.     MEDICAL HISTORY:  Past Medical History:  Diagnosis Date  . BPH (benign prostatic hyperplasia)   . Hepatitis C 11/27/2017  . Hypertension   . Plasma cell leukemia ([place]) 11/23/2017  . Tobacco abuse     SURGICAL HISTORY: Past Surgical History:  Procedure Laterality Date  . APPENDECTOMY    . FEMUR IM NAIL Right 11/20/2017   Procedure: RIGHT FEMORAL INTRAMEDULLARY (IM) NAIL;  Surgeon: Nicholes Stairs, MD;  Location: [place];  Service: Orthopedics;  Laterality: Right;    SOCIAL HISTORY: Social History   Socioeconomic History  . Marital status: Single    Spouse name: Not on file  . Number of children: Not on file  . Years of education: Not on file  . Highest education level: Not on file  Occupational History  . Not on file  Tobacco Use  . Smoking status: Current Every Day Smoker    Packs/day: 0.25    Types: Cigarettes    Last attempt to quit: 11/09/2017    Years since quitting: 2.3  . Smokeless tobacco: Never Used  . Tobacco comment: 1 cigarette/day as of 09/25/18  Substance and Sexual Activity  . Alcohol use: Yes  . Drug use: No  . Sexual activity: Not on file  Other Topics Concern  . Not on file  Social History Narrative  . Not on file   Social Determinants of Health  Financial Resource Strain:   . Difficulty of Paying Living Expenses:   Food Insecurity:   . Worried About Charity fundraiser in the Last Year:   . Arboriculturist in the Last Year:   Transportation Needs:   . Film/video editor (Medical):   [name] Lack of Transportation (Non-Medical):   Physical Activity:   . Days of Exercise per Week:   . Minutes of Exercise per Session:   Stress:   . Feeling of Stress :   Social Connections:   . Frequency of Communication with Friends and Family:   . Frequency of Social Gatherings with Friends and Family:   . Attends Religious Services:   . Active Member of Clubs or  Organizations:   . Attends Archivist Meetings:   [name] Marital Status:   Intimate Partner Violence:   . Fear of Current or Ex-Partner:   . Emotionally Abused:   [name] Physically Abused:   . Sexually Abused:     FAMILY HISTORY: Family History  Problem Relation Age of Onset  . COPD Mother   . Liver cancer Mother     ALLERGIES:  has No Known Allergies.  MEDICATIONS:  . Current Outpatient Medications on File Prior to Visit  Medication Sig Dispense Refill  . acyclovir (ZOVIRAX) 400 MG tablet Take 1 tablet (400 mg total) by mouth daily. 30 tablet 11  . aspirin EC 81 MG tablet Take 1 tablet (81 mg total) by mouth daily. 60 tablet 11  . cholecalciferol (VITAMIN D) 400 units TABS tablet Take 400 Units by mouth daily.    [name] lenalidomide (REVLIMID) 10 MG capsule Take 1 capsule (10 mg total) by mouth daily. Take for 21 days on, 7 days off, repeat every 28 days. Adult male: Auth # [id number]; Date 02/17/2020 21 capsule 0  . Multiple Vitamins-Minerals (MULTIVITAMIN WITH MINERALS) tablet Take 1 tablet by mouth daily.    . nicotine (NICODERM CQ - DOSED IN MG/24 HOURS) 14 mg/24hr patch PLACE 1 PATCH (14 MG TOTAL) ONTO THE SKIN DAILY. 28 patch 1  . ondansetron (ZOFRAN) 4 MG tablet Take 2 tablets (8 mg total) by mouth every 8 (eight) hours as needed for nausea. 30 tablet 0  . dexamethasone (DECADRON) 4 MG tablet Take 5 tablets (20 mg total) by mouth once a week. (Patient not taking: Reported on 08/27/2019) 20 tablet 1   Current Facility-Administered Medications on File Prior to Visit  Medication Dose Route Frequency Provider Last Rate Last Admin  . 0.9 %  sodium chloride infusion   Intravenous Once Brunetta Genera, MD      . 0.9 %  sodium chloride infusion   Intravenous Continuous Brunetta Genera, MD      . acetaminophen (TYLENOL) tablet 650 mg  650 mg Oral Once Brunetta Genera, MD      . carfilzomib (KYPROLIS) 104 mg in dextrose 5 % 100 mL chemo infusion  56 mg/m2 (Treatment Plan  Recorded) Intravenous Once Brunetta Genera, MD      . dexamethasone (DECADRON) 20 mg in sodium chloride 0.9 % 50 mL IVPB  20 mg Intravenous Once Brunetta Genera, MD      . famotidine (PEPCID) tablet 20 mg  20 mg Oral Once Brunetta Genera, MD        PHYSICAL EXAMINATION:  ECOG FS:1 - Symptomatic but completely ambulatory  Vitals:   03/19/20 0918  BP: 112/73  Pulse: 85  Resp: 18  Temp: 98 F (36.7 C)  SpO2: 100%   Wt Readings from Last 3 Encounters:  03/19/20 160 lb 9.6 oz (72.8 kg)  02/20/20 163 lb 1.6 oz (74 kg)  01/23/20 160 lb 1.6 oz (72.6 kg)   Body mass index is 24.42 kg/m.    GENERAL:alert, in no acute distress and comfortable SKIN: no acute rashes, no significant lesions EYES: conjunctiva are pink and non-injected, sclera anicteric OROPHARYNX: MMM, no exudates, no oropharyngeal erythema or ulceration NECK: supple, no JVD LYMPH:  no palpable lymphadenopathy in the cervical, axillary or inguinal regions LUNGS: clear to auscultation b/l with normal respiratory effort HEART: regular rate & rhythm ABDOMEN:  normoactive bowel sounds , non tender, not distended. Extremity: no pedal edema PSYCH: alert & oriented x 3 with fluent speech NEURO: no focal motor/sensory deficits  LABORATORY DATA:  I have reviewed the data as listed  . CBC Latest Ref Rng & Units 03/19/2020 03/05/2020 02/20/2020  WBC 4.0 - 10.5 K/uL 5.1 7.4 6.1  Hemoglobin 13.0 - 17.0 g/dL 12.5(L) 13.2 13.9  Hematocrit 39.0 - 52.0 % 37.2(L) 38.6(L) 41.5  Platelets 150 - 400 K/uL 175 266 252   . CBC    Component Value Date/Time   WBC 5.1 03/19/2020 0858   RBC 4.01 (L) 03/19/2020 0858   HGB 12.5 (L) 03/19/2020 0858   HGB 11.9 (L) 08/27/2019 0922   HGB 8.3 (L) 12/14/2017 1230   HCT 37.2 (L) 03/19/2020 0858   HCT 23.9 (L) 12/14/2017 1230   PLT 175 03/19/2020 0858   PLT 203 08/27/2019 0922   PLT 524 (H) 12/14/2017 1230   MCV 92.8 03/19/2020 0858   MCV 88.4 12/14/2017 1230   MCH 31.2  03/19/2020 0858   MCHC 33.6 03/19/2020 0858   RDW 14.7 03/19/2020 0858   RDW 15.3 (H) 12/14/2017 1230   LYMPHSABS 1.1 03/19/2020 0858   LYMPHSABS 1.4 12/14/2017 1230   MONOABS 0.7 03/19/2020 0858   MONOABS 0.6 12/14/2017 1230   EOSABS 0.3 03/19/2020 0858   EOSABS 0.1 12/14/2017 1230   BASOSABS 0.1 03/19/2020 0858   BASOSABS 0.1 12/14/2017 1230     . CMP Latest Ref Rng & Units 03/19/2020 03/05/2020 02/20/2020  Glucose 70 - 99 mg/dL 93 98 98  BUN 8 - 23 mg/dL 28(H) 27(H) 28(H)  Creatinine 0.61 - 1.24 mg/dL 1.98(H) 1.81(H) 1.78(H)  Sodium 135 - 145 mmol/L 139 139 140  Potassium 3.5 - 5.1 mmol/L 4.0 4.5 4.6  Chloride 98 - 111 mmol/L 110 112(H) 109  CO2 22 - 32 mmol/L 21(L) 18(L) 20(L)  Calcium 8.9 - 10.3 mg/dL 8.7(L) 8.9 9.1  Total Protein 6.5 - 8.1 g/dL 6.9 7.1 7.4  Total Bilirubin 0.3 - 1.2 mg/dL 0.6 0.5 0.4  Alkaline Phos 38 - 126 U/L 76 67 83  AST 15 - 41 U/L 8(L) 11(L) 10(L)  ALT 0 - 44 U/L 11 13 10    03/09/20 ([id number]) Bone Marrow Biopsy at [facility]   07/02/2019 Bone Marrow Biopsy ([id number]) at [facility]    08/07/18 BM Bx:       RADIOGRAPHIC STUDIES: I have personally reviewed the radiological images as listed and agreed with the findings in the report. No results found.  ASSESSMENT & PLAN:   64 y.o. 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  Initial BM Bx showed >95% involvement with kappa restricted serum free light chains. 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 MoL Cy-   08/07/18 BM Bx revealed normocellular bone marrow with trilineage hematopoiesis and 1% plasma cells which indicates the pt is in remission 08/06/18 PET/CT revealed   Expansile lytic lesion involving the left iliac bone, unchanged. Lytic lesion involving the sternum, unchanged. While both are suspicious for myeloma, neither lesion is FDG avid. No hypermetabolic osseous lesions in the visualized axial and appendicular skeleton. Please note that the bilateral lower extremities were not imaged. No suspicious lymphadenopathy. Spleen is normal in size.    Pt began Ninlaro 52m maintenance after pt finished 13 cycles of CyBorD and BM bx showed only 1% plasma cells, then resumed CyBorD and held Ninlaro in anticipation of his tandem bone marrow transplants beginning in late December 2019 [facility]  11/19/18 High dose chemotherapy with Melphalan 1491mm2 and autologous stem cell rescue  03/20/19 High dose chemotherapy with Melphalan 20037m2 and autologous stem cell rescue. Non-infectious diarrhea, uncomplicated course otherwise.  07/02/2019 PET scan with results revealing "1. No FDG avid bone lesions are identified. 2.  Asymmetric uptake within the right, greater than left, palatine tonsils with mild fullness of the right tonsillar soft tissue. ENT consult vs attention on follow up suggested. 3.  Innumerable mixed sclerotic and lytic lesions throughout the axial and appendicular skeleton without associated hypermetabolic activity. 4.  Ancillary CT findings as above."  07/02/2019 bone marrow biopsy ([id number])with results showing: "Trilineage hematopoieses with maturation. No plasma cell myeloma identified. Mild anemia and No Rouleaux or circulating plasma cells identified in the peripheral blood."  2) s/p Prophylactic IM nailing for rt femur  completed [facility]-patient previously reported improvement and is currently progressively back to full time work.  3) Thrombocytopenia resolved   4) Renal insufficiency -- likely primarily related to Myeloma. stable Plan -maintain follow up with nephrology referral to Dr  ColMarval RegalaNarda Amberdney for continued treatment -Pt completed 8 weeks of maverick for hep c with Dr ComLinus Salmons 07/08/18.  -Kidney numbers improved. Creatinine in the 1.7-2.2 range   5) h/o tobacco abuse-counseled on smoking cessation -Currently not smoking.  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 -Hgb improved to 13.4   -monitor  PLAN: -Discussed pt labwork today, 03/19/20; of CBC w/diff and CMP is as follows: all values are WNL except for RBC at 4.01, Hemoglobin at 12.5, HCT at 37.2, CO2 at 21, BUN at 28, Creatinine at 1.98, Calcium at 8.7, AST at 8, GFR, Est Non Af Am at 35, GFR, Est AFR Am at 41. -Advised on cramping- staying hydrated, taking Vitamin D, and keeping electrolytes up -Advised on Bone Marrow Biopsy showing no evidence of active myeloma -The pt has no prohibitive toxicities from continuing C7D1 of maintainence Carfilzomib at this time  -Recommends getting colonoscopy after finishing both COVID19 vaccines.   -Continue Pamidronate q8 weeks -Recommends Vitamin D supplement once a week. -Recommends taking Tizanidine prn for muscle cramps -Recommends staying active   FOLLOW UP: Follow-up with next 2 cycles (4 doses) of carfilzomib every 2 weeks with labs and port flush Continue Pamidronate q8 weeks MD visit every 4 weeks  The total time spent in the appt was 30 minutes and more than 50% was on counseling and direct patient cares  and co-ordination of cares with transplant team.  All of the patient's questions were answered with apparent satisfaction. The patient knows to call the clinic with any problems, questions or concerns.  Sullivan Lone MD [place] AAHIVMS [facility] Hematology/Oncology Physician [facility]  (Office):       [phone] (Work cell):  [phone] (Fax):           [phone]  I, Dawayne Cirri am acting as a scribe for Dr. Sullivan Lone.   .I have  reviewed the above documentation for accuracy and completeness, and I agree with the above. Brunetta Genera MD

## 2020-03-19 ENCOUNTER — Other Ambulatory Visit: Payer: Self-pay

## 2020-03-19 ENCOUNTER — Inpatient Hospital Stay: Payer: BC Managed Care – PPO | Attending: Hematology

## 2020-03-19 ENCOUNTER — Other Ambulatory Visit: Payer: Self-pay | Admitting: *Deleted

## 2020-03-19 ENCOUNTER — Inpatient Hospital Stay: Payer: BC Managed Care – PPO

## 2020-03-19 ENCOUNTER — Inpatient Hospital Stay (HOSPITAL_BASED_OUTPATIENT_CLINIC_OR_DEPARTMENT_OTHER): Payer: BC Managed Care – PPO | Admitting: Hematology

## 2020-03-19 VITALS — BP 112/73 | HR 85 | Temp 98.0°F | Resp 18 | Ht 68.0 in | Wt 160.6 lb

## 2020-03-19 DIAGNOSIS — Z79899 Other long term (current) drug therapy: Secondary | ICD-10-CM | POA: Insufficient documentation

## 2020-03-19 DIAGNOSIS — Z5112 Encounter for antineoplastic immunotherapy: Secondary | ICD-10-CM | POA: Insufficient documentation

## 2020-03-19 DIAGNOSIS — Z9481 Bone marrow transplant status: Secondary | ICD-10-CM

## 2020-03-19 DIAGNOSIS — Z7982 Long term (current) use of aspirin: Secondary | ICD-10-CM | POA: Insufficient documentation

## 2020-03-19 DIAGNOSIS — C9010 Plasma cell leukemia not having achieved remission: Secondary | ICD-10-CM

## 2020-03-19 DIAGNOSIS — F17210 Nicotine dependence, cigarettes, uncomplicated: Secondary | ICD-10-CM | POA: Insufficient documentation

## 2020-03-19 DIAGNOSIS — Z5111 Encounter for antineoplastic chemotherapy: Secondary | ICD-10-CM

## 2020-03-19 DIAGNOSIS — N400 Enlarged prostate without lower urinary tract symptoms: Secondary | ICD-10-CM | POA: Diagnosis not present

## 2020-03-19 DIAGNOSIS — I10 Essential (primary) hypertension: Secondary | ICD-10-CM | POA: Diagnosis not present

## 2020-03-19 DIAGNOSIS — N289 Disorder of kidney and ureter, unspecified: Secondary | ICD-10-CM | POA: Insufficient documentation

## 2020-03-19 DIAGNOSIS — C9011 Plasma cell leukemia in remission: Secondary | ICD-10-CM

## 2020-03-19 DIAGNOSIS — Z7952 Long term (current) use of systemic steroids: Secondary | ICD-10-CM | POA: Diagnosis not present

## 2020-03-19 DIAGNOSIS — Z7189 Other specified counseling: Secondary | ICD-10-CM

## 2020-03-19 DIAGNOSIS — D649 Anemia, unspecified: Secondary | ICD-10-CM | POA: Diagnosis not present

## 2020-03-19 LAB — CBC WITH DIFFERENTIAL/PLATELET
Abs Immature Granulocytes: 0.02 10*3/uL (ref 0.00–0.07)
Basophils Absolute: 0.1 10*3/uL (ref 0.0–0.1)
Basophils Relative: 1 %
Eosinophils Absolute: 0.3 10*3/uL (ref 0.0–0.5)
Eosinophils Relative: 5 %
HCT: 37.2 % — ABNORMAL LOW (ref 39.0–52.0)
Hemoglobin: 12.5 g/dL — ABNORMAL LOW (ref 13.0–17.0)
Immature Granulocytes: 0 %
Lymphocytes Relative: 21 %
Lymphs Abs: 1.1 10*3/uL (ref 0.7–4.0)
MCH: 31.2 pg (ref 26.0–34.0)
MCHC: 33.6 g/dL (ref 30.0–36.0)
MCV: 92.8 fL (ref 80.0–100.0)
Monocytes Absolute: 0.7 10*3/uL (ref 0.1–1.0)
Monocytes Relative: 13 %
Neutro Abs: 3 10*3/uL (ref 1.7–7.7)
Neutrophils Relative %: 60 %
Platelets: 175 10*3/uL (ref 150–400)
RBC: 4.01 MIL/uL — ABNORMAL LOW (ref 4.22–5.81)
RDW: 14.7 % (ref 11.5–15.5)
WBC: 5.1 10*3/uL (ref 4.0–10.5)
nRBC: 0 % (ref 0.0–0.2)

## 2020-03-19 LAB — CMP (CANCER CENTER ONLY)
ALT: 11 U/L (ref 0–44)
AST: 8 U/L — ABNORMAL LOW (ref 15–41)
Albumin: 3.5 g/dL (ref 3.5–5.0)
Alkaline Phosphatase: 76 U/L (ref 38–126)
Anion gap: 8 (ref 5–15)
BUN: 28 mg/dL — ABNORMAL HIGH (ref 8–23)
CO2: 21 mmol/L — ABNORMAL LOW (ref 22–32)
Calcium: 8.7 mg/dL — ABNORMAL LOW (ref 8.9–10.3)
Chloride: 110 mmol/L (ref 98–111)
Creatinine: 1.98 mg/dL — ABNORMAL HIGH (ref 0.61–1.24)
GFR, Est AFR Am: 41 mL/min — ABNORMAL LOW (ref >60)
GFR, Estimated: 35 mL/min — ABNORMAL LOW (ref >60)
Glucose, Bld: 93 mg/dL (ref 70–99)
Potassium: 4 mmol/L (ref 3.5–5.1)
Sodium: 139 mmol/L (ref 135–145)
Total Bilirubin: 0.6 mg/dL (ref 0.3–1.2)
Total Protein: 6.9 g/dL (ref 6.5–8.1)

## 2020-03-19 MED ORDER — SODIUM CHLORIDE 0.9 % IV SOLN
Freq: Once | INTRAVENOUS | Status: AC
  Filled 2020-03-19: qty 250

## 2020-03-19 MED ORDER — SODIUM CHLORIDE 0.9 % IV SOLN
INTRAVENOUS | Status: AC
  Filled 2020-03-19 (×2): qty 250

## 2020-03-19 MED ORDER — FAMOTIDINE 20 MG PO TABS
ORAL_TABLET | ORAL | Status: AC
  Filled 2020-03-19: qty 1

## 2020-03-19 MED ORDER — VITAMIN D (ERGOCALCIFEROL) 1.25 MG (50000 UNIT) PO CAPS: 50000.0000 [IU] | ORAL_CAPSULE | ORAL | 6 refills | Status: DC

## 2020-03-19 MED ORDER — TIZANIDINE HCL 2 MG PO TABS: 2.0000 mg | ORAL_TABLET | Freq: Three times a day (TID) | ORAL | 0 refills | Status: DC | PRN

## 2020-03-19 MED ORDER — FAMOTIDINE 20 MG PO TABS
20.0000 mg | ORAL_TABLET | Freq: Once | ORAL | Status: AC
  Administered 2020-03-19: 20 mg via ORAL

## 2020-03-19 MED ORDER — ACETAMINOPHEN 325 MG PO TABS
ORAL_TABLET | ORAL | Status: AC
  Filled 2020-03-19: qty 2

## 2020-03-19 MED ORDER — DEXTROSE 5 % IV SOLN
54.0000 mg/m2 | Freq: Once | INTRAVENOUS | Status: AC
  Administered 2020-03-19: 100 mg via INTRAVENOUS
  Filled 2020-03-19: qty 15

## 2020-03-19 MED ORDER — LENALIDOMIDE 10 MG PO CAPS: 10.0000 mg | ORAL_CAPSULE | Freq: Every day | ORAL | 0 refills | Status: DC

## 2020-03-19 MED ORDER — ACETAMINOPHEN 325 MG PO TABS
650.0000 mg | ORAL_TABLET | Freq: Once | ORAL | Status: AC
  Administered 2020-03-19: 650 mg via ORAL

## 2020-03-19 MED ORDER — SODIUM CHLORIDE 0.9 % IV SOLN
20.0000 mg | Freq: Once | INTRAVENOUS | Status: AC
  Administered 2020-03-19: 20 mg via INTRAVENOUS
  Filled 2020-03-19: qty 20

## 2020-03-19 NOTE — Progress Notes (Signed)
Per Dr. Kale, ok to treat with elevated creatinine.  

## 2020-03-19 NOTE — Telephone Encounter (Signed)
Received faxed refill request for Revlimid from [place] for Revlimid 10mg  Verbal order from Dr. Irene Limbo to refill Refill Escribed to [facility] by Westley Gambles, [place] - [id number] [employer] Auth# [id number], 03/19/20

## 2020-03-19 NOTE — Patient Instructions (Signed)
Cancer Center Discharge Instructions for Patients Receiving Chemotherapy  Today you received the following chemotherapy agents carfilzomib (Kyprolis)  To help prevent nausea and vomiting after your treatment, we encourage you to take your nausea medication as directed   If you develop nausea and vomiting that is not controlled by your nausea medication, call the clinic.   BELOW ARE SYMPTOMS THAT SHOULD BE REPORTED IMMEDIATELY:  *FEVER GREATER THAN 100.5 F  *CHILLS WITH OR WITHOUT FEVER  NAUSEA AND VOMITING THAT IS NOT CONTROLLED WITH YOUR NAUSEA MEDICATION  *UNUSUAL SHORTNESS OF BREATH  *UNUSUAL BRUISING OR BLEEDING  TENDERNESS IN MOUTH AND THROAT WITH OR WITHOUT PRESENCE OF ULCERS  *URINARY PROBLEMS  *BOWEL PROBLEMS  UNUSUAL RASH Items with * indicate a potential emergency and should be followed up as soon as possible.  Feel free to call the clinic should you have any questions or concerns. The clinic phone number is [phone].  Please show the CHEMO ALERT CARD at check-in to the Emergency Department and triage nurse.   

## 2020-03-23 ENCOUNTER — Telehealth: Payer: Self-pay | Admitting: Hematology

## 2020-03-23 NOTE — Telephone Encounter (Signed)
Scheduled per 03/12 los, patient has been called and notified.

## 2020-03-29 NOTE — Progress Notes (Signed)
Pharmacist Chemotherapy Monitoring - Follow Up Assessment    I verify that I have reviewed each item in the below checklist:  . Regimen for the patient is scheduled for the appropriate day and plan matches scheduled date. [name] Appropriate non-routine labs are ordered dependent on drug ordered. . If applicable, additional medications reviewed and ordered per protocol based on lifetime cumulative doses and/or treatment regimen.   Plan for follow-up and/or issues identified: No . I-vent associated with next due treatment: No . MD and/or nursing notified: No  Romualdo Bolk [facility] 03/29/2020 10:08 AM

## 2020-04-02 ENCOUNTER — Telehealth: Payer: Self-pay

## 2020-04-02 ENCOUNTER — Inpatient Hospital Stay: Payer: BC Managed Care – PPO

## 2020-04-02 ENCOUNTER — Ambulatory Visit: Payer: BC Managed Care – PPO

## 2020-04-02 ENCOUNTER — Other Ambulatory Visit: Payer: Self-pay

## 2020-04-02 ENCOUNTER — Telehealth: Payer: Self-pay | Admitting: Hematology

## 2020-04-02 ENCOUNTER — Other Ambulatory Visit: Payer: BC Managed Care – PPO

## 2020-04-02 ENCOUNTER — Other Ambulatory Visit: Payer: Self-pay | Admitting: Hematology

## 2020-04-02 VITALS — BP 114/77 | HR 77 | Temp 97.4°F | Resp 16

## 2020-04-02 DIAGNOSIS — C9010 Plasma cell leukemia not having achieved remission: Secondary | ICD-10-CM

## 2020-04-02 DIAGNOSIS — Z5111 Encounter for antineoplastic chemotherapy: Secondary | ICD-10-CM

## 2020-04-02 DIAGNOSIS — Z5112 Encounter for antineoplastic immunotherapy: Secondary | ICD-10-CM | POA: Diagnosis not present

## 2020-04-02 DIAGNOSIS — Z7189 Other specified counseling: Secondary | ICD-10-CM

## 2020-04-02 LAB — CBC WITH DIFFERENTIAL/PLATELET
Abs Immature Granulocytes: 0.03 10*3/uL (ref 0.00–0.07)
Basophils Absolute: 0.1 10*3/uL (ref 0.0–0.1)
Basophils Relative: 2 %
Eosinophils Absolute: 0.3 10*3/uL (ref 0.0–0.5)
Eosinophils Relative: 4 %
HCT: 39.2 % (ref 39.0–52.0)
Hemoglobin: 12.9 g/dL — ABNORMAL LOW (ref 13.0–17.0)
Immature Granulocytes: 0 %
Lymphocytes Relative: 20 %
Lymphs Abs: 1.5 10*3/uL (ref 0.7–4.0)
MCH: 30.9 pg (ref 26.0–34.0)
MCHC: 32.9 g/dL (ref 30.0–36.0)
MCV: 93.8 fL (ref 80.0–100.0)
Monocytes Absolute: 0.9 10*3/uL (ref 0.1–1.0)
Monocytes Relative: 12 %
Neutro Abs: 4.5 10*3/uL (ref 1.7–7.7)
Neutrophils Relative %: 62 %
Platelets: 194 10*3/uL (ref 150–400)
RBC: 4.18 MIL/uL — ABNORMAL LOW (ref 4.22–5.81)
RDW: 14.8 % (ref 11.5–15.5)
WBC: 7.3 10*3/uL (ref 4.0–10.5)
nRBC: 0 % (ref 0.0–0.2)

## 2020-04-02 LAB — CMP (CANCER CENTER ONLY)
ALT: 13 U/L (ref 0–44)
AST: 10 U/L — ABNORMAL LOW (ref 15–41)
Albumin: 3.7 g/dL (ref 3.5–5.0)
Alkaline Phosphatase: 74 U/L (ref 38–126)
Anion gap: 10 (ref 5–15)
BUN: 31 mg/dL — ABNORMAL HIGH (ref 8–23)
CO2: 19 mmol/L — ABNORMAL LOW (ref 22–32)
Calcium: 8.9 mg/dL (ref 8.9–10.3)
Chloride: 111 mmol/L (ref 98–111)
Creatinine: 1.87 mg/dL — ABNORMAL HIGH (ref 0.61–1.24)
GFR, Est AFR Am: 44 mL/min — ABNORMAL LOW (ref >60)
GFR, Estimated: 38 mL/min — ABNORMAL LOW (ref >60)
Glucose, Bld: 85 mg/dL (ref 70–99)
Potassium: 4.7 mmol/L (ref 3.5–5.1)
Sodium: 140 mmol/L (ref 135–145)
Total Bilirubin: 0.5 mg/dL (ref 0.3–1.2)
Total Protein: 7.2 g/dL (ref 6.5–8.1)

## 2020-04-02 MED ORDER — ACETAMINOPHEN 325 MG PO TABS
ORAL_TABLET | ORAL | Status: AC
  Filled 2020-04-02: qty 2

## 2020-04-02 MED ORDER — SODIUM CHLORIDE 0.9 % IV SOLN
Freq: Once | INTRAVENOUS | Status: AC
  Filled 2020-04-02: qty 250

## 2020-04-02 MED ORDER — FAMOTIDINE 20 MG PO TABS
ORAL_TABLET | ORAL | Status: AC
  Filled 2020-04-02: qty 1

## 2020-04-02 MED ORDER — DEXTROSE 5 % IV SOLN
54.0000 mg/m2 | Freq: Once | INTRAVENOUS | Status: AC
  Administered 2020-04-02: 100 mg via INTRAVENOUS
  Filled 2020-04-02: qty 30

## 2020-04-02 MED ORDER — FAMOTIDINE IN NACL 20-0.9 MG/50ML-% IV SOLN
INTRAVENOUS | Status: AC
  Filled 2020-04-02: qty 50

## 2020-04-02 MED ORDER — FAMOTIDINE 20 MG PO TABS
20.0000 mg | ORAL_TABLET | Freq: Once | ORAL | Status: AC
  Administered 2020-04-02: 20 mg via ORAL

## 2020-04-02 MED ORDER — SODIUM CHLORIDE 0.9 % IV SOLN
20.0000 mg | Freq: Once | INTRAVENOUS | Status: AC
  Administered 2020-04-02: 20 mg via INTRAVENOUS
  Filled 2020-04-02: qty 20

## 2020-04-02 MED ORDER — ACETAMINOPHEN 325 MG PO TABS
650.0000 mg | ORAL_TABLET | Freq: Once | ORAL | Status: AC
  Administered 2020-04-02: 650 mg via ORAL

## 2020-04-02 NOTE — Progress Notes (Signed)
Per Dr. Irene Limbo, ok to treat with elevated creatinine. Patient due for pamidronate next week. Dr. Irene Limbo advised to have patient receive during next Kyprolis infusion on 5/7. In basket message sent to scheduling dept.

## 2020-04-02 NOTE — Telephone Encounter (Signed)
Called pt back per 4/23  vmail . No answer , left message for pt to call back

## 2020-04-02 NOTE — Patient Instructions (Signed)
[facility] Discharge Instructions for Patients Receiving Chemotherapy  Today you received the following chemotherapy agents:  Kyprolis  To help prevent nausea and vomiting after your treatment, we encourage you to take your nausea medication as directed.   If you develop nausea and vomiting that is not controlled by your nausea medication, call the clinic.   BELOW ARE SYMPTOMS THAT SHOULD BE REPORTED IMMEDIATELY:  *FEVER GREATER THAN 100.5 F  *CHILLS WITH OR WITHOUT FEVER  NAUSEA AND VOMITING THAT IS NOT CONTROLLED WITH YOUR NAUSEA MEDICATION  *UNUSUAL SHORTNESS OF BREATH  *UNUSUAL BRUISING OR BLEEDING  TENDERNESS IN MOUTH AND THROAT WITH OR WITHOUT PRESENCE OF ULCERS  *URINARY PROBLEMS  *BOWEL PROBLEMS  UNUSUAL RASH Items with * indicate a potential emergency and should be followed up as soon as possible.  Feel free to call the clinic should you have any questions or concerns. The clinic phone number is [phone].  Please show the CHEMO ALERT CARD at check-in to the Emergency Department and triage nurse.   

## 2020-04-02 NOTE — Telephone Encounter (Signed)
Left voicemail for patient notifying him of appt time changes for 04/16/2020 appts.

## 2020-04-08 ENCOUNTER — Telehealth: Payer: Self-pay | Admitting: *Deleted

## 2020-04-08 NOTE — Telephone Encounter (Signed)
Patient called - he scheduled Bone Density scan as requested by [facility] Transplant team (ordered by Dr. Irene Limbo) for June 7th

## 2020-04-08 NOTE — Telephone Encounter (Signed)
Contacted patient to remind to schedule bone density scan (ordered) requested by [facility] Transplant services. Gave patient number to call. Patient verbalized understanding.

## 2020-04-12 NOTE — Progress Notes (Signed)
Pharmacist Chemotherapy Monitoring - Follow Up Assessment    I verify that I have reviewed each item in the below checklist:  . Regimen for the patient is scheduled for the appropriate day and plan matches scheduled date. [name] Appropriate non-routine labs are ordered dependent on drug ordered. . If applicable, additional medications reviewed and ordered per protocol based on lifetime cumulative doses and/or treatment regimen.   Plan for follow-up and/or issues identified: No . I-vent associated with next due treatment: No . MD and/or nursing notified: No  Andres Fernandez Course 04/12/2020 4:00 PM

## 2020-04-15 NOTE — Progress Notes (Signed)
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 His labs today 12/14/2017 show hgb 8.3, platelets 524k. He is doing well overall. He has staples in his right upper leg following a recent surgery and has not been in touch with the surgeons for a f/u of this. He is not receiving physical therapy and is not getting around at home.  He notes that he was not scheduled for a post hospitalization nephrology f/u and has no PCP.  On review of systems, pt reports intermittent fatigue, SOB, pain to the left leg, dizziness, dry mouth and denies fever, chills, night sweats, dysuria and any other accompanying symptoms.   INTERVAL HISTORY:    Andres Fernandez presents today for follow up and continue management of his myeloma/plasma cell leukemia. The patient's last visit with [name] was on 03/19/20. The pt reports that he is doing well overall.  The pt reports he is good. Pt has gotten both COVID19 vaccines. He is no longer cramping and has used the muscle relaxant once. Pt is taking Vitamin D regularly. He will be going on Monday for his bone density study. Pt has appointments for remaining vaccines.   Lab results today (04/16/20) of CBC w/diff and CMP is as follows: all values are WNL except for RBC at 3.86, Hemoglobin at 12.2, HCT at 36.4, Glucose at 19, BUN at 26, Creatinine at 1.83, AST at 11, GFR, Est Non Af Am at 39, GFR, Est AFR Am at 45 04/16/20 of Kappa/Lamda Light Chains is as follows: all values are WNL   On review of systems, pt reports healthy appetite and denies tingling/numbess, irregular bowl movements, bone pain, mouth soars, abdominal pain, skin rashes or itching and any other symptoms.   REVIEW OF SYSTEMS:   A 10+ POINT REVIEW OF SYSTEMS WAS  OBTAINED including neurology, dermatology, psychiatry, cardiac, respiratory, lymph, extremities, GI, GU, Musculoskeletal, constitutional, breasts, reproductive, HEENT.  All pertinent positives are noted in the HPI.  All others are negative.   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   Tobacco abuse     SURGICAL HISTORY: Past Surgical History:  Procedure Laterality Date   APPENDECTOMY     FEMUR IM NAIL Right 11/20/2017   Procedure: RIGHT FEMORAL INTRAMEDULLARY (IM) NAIL;  Surgeon: Nicholes Stairs, MD;  Location: [facility];  Service: Orthopedics;  Laterality: Right;    SOCIAL HISTORY: Social History   Socioeconomic History   Marital status: Single    Spouse name: Not on file   Number of children: Not on file   Years of education: Not on file   Highest education level: Not on file  Occupational History   Not on file  Tobacco Use   Smoking status: Current Every Day Smoker    Packs/day: 0.25    Types: Cigarettes    Last attempt to quit: 11/09/2017    Years since quitting: 2.4   Smokeless tobacco: Never Used   Tobacco comment: 1 cigarette/day as of 09/25/18  Substance and Sexual Activity   Alcohol use: Yes   Drug use: No   Sexual activity: Not on file  Other Topics Concern  Not on file  Social History Narrative   Not on file   Social Determinants of Health   Financial Resource Strain:    Difficulty of Paying Living Expenses:   Food Insecurity:    Worried About Charity fundraiser in the Last Year:    Arboriculturist in the Last Year:   Transportation Needs:    Film/video editor (Medical):    Lack of Transportation (Non-Medical):   Physical Activity:    Days of Exercise per Week:    Minutes of Exercise per Session:   Stress:    Feeling of Stress :   Social Connections:    Frequency of  Communication with Friends and Family:    Frequency of Social Gatherings with Friends and Family:    Attends Religious Services:    Active Member of Clubs or Organizations:    Attends Music therapist:    Marital Status:   Intimate Partner Violence:    Fear of Current or Ex-Partner:    Emotionally Abused:    Physically Abused:    Sexually Abused:     FAMILY HISTORY: Family History  Problem Relation Age of Onset   COPD Mother    Liver cancer Mother     ALLERGIES:  has No Known Allergies.  MEDICATIONS:  . Current Outpatient Medications on File Prior to Visit  Medication Sig Dispense Refill   acyclovir (ZOVIRAX) 400 MG tablet Take 1 tablet (400 mg total) by mouth daily. 30 tablet 11   aspirin EC 81 MG tablet Take 1 tablet (81 mg total) by mouth daily. 60 tablet 11   cholecalciferol (VITAMIN D) 400 units TABS tablet Take 400 Units by mouth daily.     lenalidomide (REVLIMID) 10 MG capsule Take 1 capsule (10 mg total) by mouth daily. Take for 21 days on, 7 days off, repeat every 28 days. 21 capsule 0   Multiple Vitamins-Minerals (MULTIVITAMIN WITH MINERALS) tablet Take 1 tablet by mouth daily.     nicotine (NICODERM CQ - DOSED IN MG/24 HOURS) 14 mg/24hr patch PLACE 1 PATCH (14 MG TOTAL) ONTO THE SKIN DAILY. 28 patch 1   ondansetron (ZOFRAN) 4 MG tablet Take 2 tablets (8 mg total) by mouth every 8 (eight) hours as needed for nausea. 30 tablet 0   tiZANidine (ZANAFLEX) 2 MG tablet Take 1 tablet (2 mg total) by mouth every 8 (eight) hours as needed for muscle spasms. 30 tablet 0   Vitamin D, Ergocalciferol, (DRISDOL) 1.25 MG (50000 UNIT) CAPS capsule Take 1 capsule (50,000 Units total) by mouth once a week. 12 capsule 6   dexamethasone (DECADRON) 4 MG tablet Take 5 tablets (20 mg total) by mouth once a week. (Patient not taking: Reported on 08/27/2019) 20 tablet 1   No current facility-administered medications on file prior to visit.    PHYSICAL  EXAMINATION:  ECOG FS:1 - Symptomatic but completely ambulatory  Vitals:   04/16/20 0906  BP: 111/79  Pulse: 87  Resp: 18  Temp: 97.6 F (36.4 C)  SpO2: 100%   Wt Readings from Last 3 Encounters:  04/16/20 160 lb 4.8 oz (72.7 kg)  03/19/20 160 lb 9.6 oz (72.8 kg)  02/20/20 163 lb 1.6 oz (74 kg)   Body mass index is 24.37 kg/m.    GENERAL:alert, in no acute distress and comfortable SKIN: no acute rashes, no significant lesions EYES: conjunctiva are pink and non-injected, sclera anicteric OROPHARYNX: MMM, no exudates, no oropharyngeal erythema or ulceration NECK: supple, no  JVD LYMPH:  no palpable lymphadenopathy in the cervical, axillary or inguinal regions LUNGS: clear to auscultation b/l with normal respiratory effort HEART: regular rate & rhythm ABDOMEN:  normoactive bowel sounds , non tender, not distended. Extremity: no pedal edema PSYCH: alert & oriented x 3 with fluent speech NEURO: no focal motor/sensory deficits  LABORATORY DATA:  I have reviewed the data as listed  . CBC Latest Ref Rng & Units 04/16/2020 04/02/2020 03/19/2020  WBC 4.0 - 10.5 K/uL 5.2 7.3 5.1  Hemoglobin 13.0 - 17.0 g/dL 12.2(L) 12.9(L) 12.5(L)  Hematocrit 39.0 - 52.0 % 36.4(L) 39.2 37.2(L)  Platelets 150 - 400 K/uL 184 194 175   . CBC    Component Value Date/Time   WBC 5.2 04/16/2020 0842   RBC 3.86 (L) 04/16/2020 0842   HGB 12.2 (L) 04/16/2020 0842   HGB 11.9 (L) 08/27/2019 0922   HGB 8.3 (L) 12/14/2017 1230   HCT 36.4 (L) 04/16/2020 0842   HCT 23.9 (L) 12/14/2017 1230   PLT 184 04/16/2020 0842   PLT 203 08/27/2019 0922   PLT 524 (H) 12/14/2017 1230   MCV 94.3 04/16/2020 0842   MCV 88.4 12/14/2017 1230   MCH 31.6 04/16/2020 0842   MCHC 33.5 04/16/2020 0842   RDW 14.9 04/16/2020 0842   RDW 15.3 (H) 12/14/2017 1230   LYMPHSABS 1.0 04/16/2020 0842   LYMPHSABS 1.4 12/14/2017 1230   MONOABS 0.5 04/16/2020 0842   MONOABS 0.6 12/14/2017 1230   EOSABS 0.4 04/16/2020 0842   EOSABS 0.1  12/14/2017 1230   BASOSABS 0.1 04/16/2020 0842   BASOSABS 0.1 12/14/2017 1230     . CMP Latest Ref Rng & Units 04/16/2020 04/02/2020 03/19/2020  Glucose 70 - 99 mg/dL 84 85 93  BUN 8 - 23 mg/dL 26(H) 31(H) 28(H)  Creatinine 0.61 - 1.24 mg/dL 1.83(H) 1.87(H) 1.98(H)  Sodium 135 - 145 mmol/L 143 140 139  Potassium 3.5 - 5.1 mmol/L 4.3 4.7 4.0  Chloride 98 - 111 mmol/L 110 111 110  CO2 22 - 32 mmol/L 19(L) 19(L) 21(L)  Calcium 8.9 - 10.3 mg/dL 9.0 8.9 8.7(L)  Total Protein 6.5 - 8.1 g/dL 7.2 7.2 6.9  Total Bilirubin 0.3 - 1.2 mg/dL 0.7 0.5 0.6  Alkaline Phos 38 - 126 U/L 75 74 76  AST 15 - 41 U/L 11(L) 10(L) 8(L)  ALT 0 - 44 U/L 12 13 11    03/09/20 ([id number]) [facility] Marrow Biopsy at [facility]   07/02/2019 Bone Marrow Biopsy ([id number]) at [facility]    08/07/18 BM Bx:       RADIOGRAPHIC STUDIES: I have personally reviewed the radiological images as listed and agreed with the findings in the report. No results found.  ASSESSMENT & PLAN:   64 y.o. 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   Initial BM Bx showed >95% involvement with kappa restricted serum free light chains. 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 MoL Cy-   08/07/18 BM Bx revealed normocellular bone marrow with trilineage hematopoiesis and 1% plasma cells which indicates the pt is in remission 08/06/18 PET/CT revealed  Expansile lytic lesion involving the left iliac bone, unchanged. Lytic lesion involving the sternum, unchanged. While both are suspicious for myeloma, neither lesion is  FDG avid. No hypermetabolic osseous lesions in the visualized axial and appendicular skeleton. Please note that  the bilateral lower extremities were not imaged. No suspicious lymphadenopathy. Spleen is normal in size.    Pt began Ninlaro 17m maintenance after pt finished 13 cycles of CyBorD and BM bx showed only 1% plasma cells, then resumed CyBorD and held Ninlaro in anticipation of his tandem bone marrow [facility] late December 2019 [facility]  11/19/18 High dose chemotherapy with Melphalan 1437mm2 and autologous stem cell rescue  03/20/19 High dose chemotherapy with Melphalan 20042m2 and autologous stem cell rescue. Non-infectious diarrhea, uncomplicated course otherwise.  07/02/2019 PET scan with results revealing "1. No FDG avid bone lesions are identified. 2.  Asymmetric uptake within the right, greater than left, palatine tonsils with mild fullness of the right tonsillar soft tissue. ENT consult vs attention on follow up suggested. 3.  Innumerable mixed sclerotic and lytic lesions throughout the axial and appendicular skeleton without associated hypermetabolic activity. 4.  Ancillary CT findings as above."  07/02/2019 bone marrow biopsy ([id number])with results showing: "Trilineage hematopoieses with maturation. No plasma cell myeloma identified. Mild anemia and No Rouleaux or circulating plasma cells identified in the peripheral blood."  2) s/p Prophylactic IM nailing for rt femur  [facility] previously reported improvement and is currently progressively back to full time work.  3) Thrombocytopenia resolved   4) Renal insufficiency -- likely primarily related to Myeloma. stable Plan -maintain follow up with nephrology referral to Dr ColMarval RegalaNarda Amberdney for continued treatment -Pt completed 8 weeks of maverick for hep c with Dr ComLinus Salmons 07/08/18.  -Kidney numbers improved. Creatinine in the 1.7-2.2 range   5) h/o tobacco abuse-counseled on smoking cessation -Currently not smoking.  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 -Hgb improved to 13.4   -monitor  PLAN: -Discussed pt labwork today, 04/16/20;  of CBC w/diff and CMP is as follows: all values are WNL except for RBC at 3.86, Hemoglobin at 12.2, HCT at 36.4, Glucose at 19, BUN at 26, Creatinine at 1.83, AST at 11, GFR, Est Non Af Am at 39, GFR, Est AFR Am at 45 -Discussed 04/16/20 of MMP- no M spike -Discussed 04/16/20 of [place] -Advised on Bone Density Scan coming up Monday -Advised getting colonoscopy 2 weeks after 2nd COVID19 vaccine  -The pt has no prohibitive toxicities from continuing C8D1 of maintainence Carfilzomib at this time  -Continue Pamidronate q8 weeks -Recommends continuing Vitamin D supplement once a week. -Recommends taking Tizanidine prn for muscle cramps  -Recommended that the pt continue to eat well, drink at least 48-64 oz of water each day, and walk 20-30 minutes each day.  -Recommends getting enough calcium and Vitamin D in diet -Will see back in 2 month  FOLLOW UP: Follow-up with next 3 cycles (6 doses) of carfilzomib every 2 weeks with labs and port flush Continue Pamidronate q8 weeks MD visit every 8 weeks  The total time spent in the appt was 20 minutes and more than 50% was on counseling and direct patient cares.  All of the patient's questions were answered with apparent satisfaction. The patient knows to call the clinic with any problems, questions or concerns.  GauSullivan Lone MS Airport HeightsHIVMS SCH[facility] Hospitalmatology/Oncology Physician ConWindsorOffice):  [phone] (Work cell):  [phone] (Fax):           [phone]  I, Dawayne Cirri am acting as a scribe for Dr. Sullivan Lone.   .I have reviewed the above documentation for accuracy and completeness, and I agree with the above. Brunetta Genera MD

## 2020-04-16 ENCOUNTER — Inpatient Hospital Stay (HOSPITAL_BASED_OUTPATIENT_CLINIC_OR_DEPARTMENT_OTHER): Payer: BC Managed Care – PPO | Admitting: Hematology

## 2020-04-16 ENCOUNTER — Other Ambulatory Visit: Payer: Self-pay

## 2020-04-16 ENCOUNTER — Ambulatory Visit: Payer: BC Managed Care – PPO | Admitting: Hematology

## 2020-04-16 ENCOUNTER — Other Ambulatory Visit: Payer: BC Managed Care – PPO

## 2020-04-16 ENCOUNTER — Inpatient Hospital Stay: Payer: BC Managed Care – PPO

## 2020-04-16 ENCOUNTER — Inpatient Hospital Stay: Payer: BC Managed Care – PPO | Attending: Hematology

## 2020-04-16 ENCOUNTER — Ambulatory Visit: Payer: BC Managed Care – PPO

## 2020-04-16 VITALS — BP 111/79 | HR 87 | Temp 97.6°F | Resp 18 | Ht 68.0 in | Wt 160.3 lb

## 2020-04-16 DIAGNOSIS — N400 Enlarged prostate without lower urinary tract symptoms: Secondary | ICD-10-CM | POA: Diagnosis not present

## 2020-04-16 DIAGNOSIS — F17210 Nicotine dependence, cigarettes, uncomplicated: Secondary | ICD-10-CM | POA: Insufficient documentation

## 2020-04-16 DIAGNOSIS — Z5112 Encounter for antineoplastic immunotherapy: Secondary | ICD-10-CM | POA: Diagnosis present

## 2020-04-16 DIAGNOSIS — C9011 Plasma cell leukemia in remission: Secondary | ICD-10-CM

## 2020-04-16 DIAGNOSIS — D649 Anemia, unspecified: Secondary | ICD-10-CM | POA: Diagnosis not present

## 2020-04-16 DIAGNOSIS — Z5111 Encounter for antineoplastic chemotherapy: Secondary | ICD-10-CM

## 2020-04-16 DIAGNOSIS — Z79899 Other long term (current) drug therapy: Secondary | ICD-10-CM | POA: Diagnosis not present

## 2020-04-16 DIAGNOSIS — F1010 Alcohol abuse, uncomplicated: Secondary | ICD-10-CM | POA: Diagnosis not present

## 2020-04-16 DIAGNOSIS — Z9484 Stem cells transplant status: Secondary | ICD-10-CM | POA: Diagnosis not present

## 2020-04-16 DIAGNOSIS — C9010 Plasma cell leukemia not having achieved remission: Secondary | ICD-10-CM

## 2020-04-16 DIAGNOSIS — N289 Disorder of kidney and ureter, unspecified: Secondary | ICD-10-CM | POA: Insufficient documentation

## 2020-04-16 DIAGNOSIS — Z7952 Long term (current) use of systemic steroids: Secondary | ICD-10-CM | POA: Insufficient documentation

## 2020-04-16 DIAGNOSIS — M84551A Pathological fracture in neoplastic disease, right femur, initial encounter for fracture: Secondary | ICD-10-CM

## 2020-04-16 DIAGNOSIS — Z7982 Long term (current) use of aspirin: Secondary | ICD-10-CM | POA: Insufficient documentation

## 2020-04-16 DIAGNOSIS — I10 Essential (primary) hypertension: Secondary | ICD-10-CM | POA: Insufficient documentation

## 2020-04-16 DIAGNOSIS — Z7189 Other specified counseling: Secondary | ICD-10-CM

## 2020-04-16 LAB — CBC WITH DIFFERENTIAL/PLATELET
Abs Immature Granulocytes: 0.03 10*3/uL (ref 0.00–0.07)
Basophils Absolute: 0.1 10*3/uL (ref 0.0–0.1)
Basophils Relative: 1 %
Eosinophils Absolute: 0.4 10*3/uL (ref 0.0–0.5)
Eosinophils Relative: 8 %
HCT: 36.4 % — ABNORMAL LOW (ref 39.0–52.0)
Hemoglobin: 12.2 g/dL — ABNORMAL LOW (ref 13.0–17.0)
Immature Granulocytes: 1 %
Lymphocytes Relative: 19 %
Lymphs Abs: 1 10*3/uL (ref 0.7–4.0)
MCH: 31.6 pg (ref 26.0–34.0)
MCHC: 33.5 g/dL (ref 30.0–36.0)
MCV: 94.3 fL (ref 80.0–100.0)
Monocytes Absolute: 0.5 10*3/uL (ref 0.1–1.0)
Monocytes Relative: 10 %
Neutro Abs: 3.3 10*3/uL (ref 1.7–7.7)
Neutrophils Relative %: 61 %
Platelets: 184 10*3/uL (ref 150–400)
RBC: 3.86 MIL/uL — ABNORMAL LOW (ref 4.22–5.81)
RDW: 14.9 % (ref 11.5–15.5)
WBC: 5.2 10*3/uL (ref 4.0–10.5)
nRBC: 0 % (ref 0.0–0.2)

## 2020-04-16 LAB — CMP (CANCER CENTER ONLY)
ALT: 12 U/L (ref 0–44)
AST: 11 U/L — ABNORMAL LOW (ref 15–41)
Albumin: 3.7 g/dL (ref 3.5–5.0)
Alkaline Phosphatase: 75 U/L (ref 38–126)
Anion gap: 14 (ref 5–15)
BUN: 26 mg/dL — ABNORMAL HIGH (ref 8–23)
CO2: 19 mmol/L — ABNORMAL LOW (ref 22–32)
Calcium: 9 mg/dL (ref 8.9–10.3)
Chloride: 110 mmol/L (ref 98–111)
Creatinine: 1.83 mg/dL — ABNORMAL HIGH (ref 0.61–1.24)
GFR, Est AFR Am: 45 mL/min — ABNORMAL LOW (ref >60)
GFR, Estimated: 39 mL/min — ABNORMAL LOW (ref >60)
Glucose, Bld: 84 mg/dL (ref 70–99)
Potassium: 4.3 mmol/L (ref 3.5–5.1)
Sodium: 143 mmol/L (ref 135–145)
Total Bilirubin: 0.7 mg/dL (ref 0.3–1.2)
Total Protein: 7.2 g/dL (ref 6.5–8.1)

## 2020-04-16 MED ORDER — DEXTROSE 5 % IV SOLN
54.0000 mg/m2 | Freq: Once | INTRAVENOUS | Status: AC
  Administered 2020-04-16: 11:00:00 100 mg via INTRAVENOUS
  Filled 2020-04-16: qty 5

## 2020-04-16 MED ORDER — SODIUM CHLORIDE 0.9 % IV SOLN
INTRAVENOUS | Status: AC
  Filled 2020-04-16 (×2): qty 250

## 2020-04-16 MED ORDER — ACETAMINOPHEN 325 MG PO TABS
ORAL_TABLET | ORAL | Status: AC
  Filled 2020-04-16: qty 2

## 2020-04-16 MED ORDER — FAMOTIDINE 20 MG PO TABS
20.0000 mg | ORAL_TABLET | Freq: Once | ORAL | Status: AC
  Administered 2020-04-16: 20 mg via ORAL

## 2020-04-16 MED ORDER — SODIUM CHLORIDE 0.9 % IV SOLN
60.0000 mg | Freq: Once | INTRAVENOUS | Status: AC
  Administered 2020-04-16: 60 mg via INTRAVENOUS
  Filled 2020-04-16: qty 10

## 2020-04-16 MED ORDER — SODIUM CHLORIDE 0.9 % IV SOLN
Freq: Once | INTRAVENOUS | Status: AC
  Filled 2020-04-16: qty 250

## 2020-04-16 MED ORDER — SODIUM CHLORIDE 0.9 % IV SOLN
20.0000 mg | Freq: Once | INTRAVENOUS | Status: AC
  Administered 2020-04-16: 20 mg via INTRAVENOUS
  Filled 2020-04-16: qty 20

## 2020-04-16 MED ORDER — FAMOTIDINE 20 MG PO TABS
ORAL_TABLET | ORAL | Status: AC
  Filled 2020-04-16: qty 1

## 2020-04-16 MED ORDER — ACETAMINOPHEN 325 MG PO TABS
650.0000 mg | ORAL_TABLET | Freq: Once | ORAL | Status: AC
  Administered 2020-04-16: 650 mg via ORAL

## 2020-04-16 NOTE — Progress Notes (Signed)
Verbal Order Dr. Irene Limbo: Andres Fernandez to receive Carfilzomib and Pamidronate today with creatinine of 1.83

## 2020-04-16 NOTE — Patient Instructions (Addendum)
[facility] Discharge Instructions for Patients Receiving Chemotherapy  Today you received the following chemotherapy agents Kyprolis and Aredia   To help prevent nausea and vomiting after your treatment, we encourage you to take your nausea medication as directed   If you develop nausea and vomiting that is not controlled by your nausea medication, call the clinic.   BELOW ARE SYMPTOMS THAT SHOULD BE REPORTED IMMEDIATELY:  *FEVER GREATER THAN 100.5 F  *CHILLS WITH OR WITHOUT FEVER  NAUSEA AND VOMITING THAT IS NOT CONTROLLED WITH YOUR NAUSEA MEDICATION  *UNUSUAL SHORTNESS OF BREATH  *UNUSUAL BRUISING OR BLEEDING  TENDERNESS IN MOUTH AND THROAT WITH OR WITHOUT PRESENCE OF ULCERS  *URINARY PROBLEMS  *BOWEL PROBLEMS  UNUSUAL RASH Items with * indicate a potential emergency and should be followed up as soon as possible.  Feel free to call the clinic should you have any questions or concerns. The clinic phone number is [phone].  Please show the CHEMO ALERT CARD at check-in to the Emergency Department and triage nurse.   

## 2020-04-19 ENCOUNTER — Other Ambulatory Visit: Payer: Self-pay

## 2020-04-19 ENCOUNTER — Ambulatory Visit
Admission: RE | Admit: 2020-04-19 | Discharge: 2020-04-19 | Disposition: A | Discharge status: Home / Self Care | Payer: BC Managed Care – PPO | Source: Ambulatory Visit | Attending: Hematology | Admitting: Hematology

## 2020-04-19 DIAGNOSIS — C9011 Plasma cell leukemia in remission: Secondary | ICD-10-CM

## 2020-04-19 DIAGNOSIS — Z9481 Bone marrow transplant status: Secondary | ICD-10-CM

## 2020-04-19 LAB — MULTIPLE MYELOMA PANEL, SERUM
Albumin SerPl Elph-Mcnc: 3.6 g/dL (ref 2.9–4.4)
Albumin/Glob SerPl: 1.2 (ref 0.7–1.7)
Alpha 1: 0.3 g/dL (ref 0.0–0.4)
Alpha2 Glob SerPl Elph-Mcnc: 1.2 g/dL — ABNORMAL HIGH (ref 0.4–1.0)
B-Globulin SerPl Elph-Mcnc: 0.9 g/dL (ref 0.7–1.3)
Gamma Glob SerPl Elph-Mcnc: 0.8 g/dL (ref 0.4–1.8)
Globulin, Total: 3.2 g/dL (ref 2.2–3.9)
IgA: 79 mg/dL (ref 61–437)
IgG (Immunoglobin G), Serum: 690 mg/dL (ref 603–1613)
IgM (Immunoglobulin M), Srm: 7 mg/dL — ABNORMAL LOW (ref 20–172)
Total Protein ELP: 6.8 g/dL (ref 6.0–8.5)

## 2020-04-19 LAB — KAPPA/LAMBDA LIGHT CHAINS
Kappa free light chain: 43.6 mg/L — ABNORMAL HIGH (ref 3.3–19.4)
Kappa, lambda light chain ratio: 1.34 (ref 0.26–1.65)
Lambda free light chains: 32.5 mg/L — ABNORMAL HIGH (ref 5.7–26.3)

## 2020-04-21 ENCOUNTER — Other Ambulatory Visit: Payer: Self-pay | Admitting: *Deleted

## 2020-04-22 ENCOUNTER — Other Ambulatory Visit: Payer: Self-pay | Admitting: *Deleted

## 2020-04-22 DIAGNOSIS — C9010 Plasma cell leukemia not having achieved remission: Secondary | ICD-10-CM

## 2020-04-22 MED ORDER — LENALIDOMIDE 10 MG PO CAPS: 10.0000 mg | ORAL_CAPSULE | Freq: Every day | ORAL | 0 refills | Status: DC

## 2020-04-22 NOTE — Telephone Encounter (Signed)
Received faxed refill request from [facility] for Revlimid 10 mg Verbal order from Dr.Kale to refill [facility] Josem Kaufmann [id number], 04/21/20 Refill escribed to [facility] by Westley Gambles, [place] - [address]

## 2020-04-30 ENCOUNTER — Ambulatory Visit: Payer: BC Managed Care – PPO

## 2020-04-30 ENCOUNTER — Other Ambulatory Visit: Payer: BC Managed Care – PPO

## 2020-04-30 ENCOUNTER — Inpatient Hospital Stay: Payer: BC Managed Care – PPO

## 2020-04-30 ENCOUNTER — Other Ambulatory Visit: Payer: Self-pay

## 2020-04-30 VITALS — BP 120/75 | HR 75 | Temp 97.7°F | Resp 18 | Wt 158.0 lb

## 2020-04-30 DIAGNOSIS — C9010 Plasma cell leukemia not having achieved remission: Secondary | ICD-10-CM

## 2020-04-30 DIAGNOSIS — Z5111 Encounter for antineoplastic chemotherapy: Secondary | ICD-10-CM

## 2020-04-30 DIAGNOSIS — Z9484 Stem cells transplant status: Secondary | ICD-10-CM

## 2020-04-30 DIAGNOSIS — Z7189 Other specified counseling: Secondary | ICD-10-CM

## 2020-04-30 LAB — CMP (CANCER CENTER ONLY)
ALT: 12 U/L (ref 0–44)
AST: 11 U/L — ABNORMAL LOW (ref 15–41)
Albumin: 3.4 g/dL — ABNORMAL LOW (ref 3.5–5.0)
Alkaline Phosphatase: 75 U/L (ref 38–126)
Anion gap: 11 (ref 5–15)
BUN: 23 mg/dL (ref 8–23)
CO2: 19 mmol/L — ABNORMAL LOW (ref 22–32)
Calcium: 8.3 mg/dL — ABNORMAL LOW (ref 8.9–10.3)
Chloride: 114 mmol/L — ABNORMAL HIGH (ref 98–111)
Creatinine: 1.66 mg/dL — ABNORMAL HIGH (ref 0.61–1.24)
GFR, Est AFR Am: 50 mL/min — ABNORMAL LOW (ref >60)
GFR, Estimated: 44 mL/min — ABNORMAL LOW (ref >60)
Glucose, Bld: 116 mg/dL — ABNORMAL HIGH (ref 70–99)
Potassium: 4.4 mmol/L (ref 3.5–5.1)
Sodium: 144 mmol/L (ref 135–145)
Total Bilirubin: 0.4 mg/dL (ref 0.3–1.2)
Total Protein: 7 g/dL (ref 6.5–8.1)

## 2020-04-30 LAB — CBC WITH DIFFERENTIAL/PLATELET
Abs Immature Granulocytes: 0.02 10*3/uL (ref 0.00–0.07)
Basophils Absolute: 0.1 10*3/uL (ref 0.0–0.1)
Basophils Relative: 2 %
Eosinophils Absolute: 0.1 10*3/uL (ref 0.0–0.5)
Eosinophils Relative: 2 %
HCT: 33.9 % — ABNORMAL LOW (ref 39.0–52.0)
Hemoglobin: 11.3 g/dL — ABNORMAL LOW (ref 13.0–17.0)
Immature Granulocytes: 0 %
Lymphocytes Relative: 25 %
Lymphs Abs: 1.5 10*3/uL (ref 0.7–4.0)
MCH: 31.3 pg (ref 26.0–34.0)
MCHC: 33.3 g/dL (ref 30.0–36.0)
MCV: 93.9 fL (ref 80.0–100.0)
Monocytes Absolute: 0.7 10*3/uL (ref 0.1–1.0)
Monocytes Relative: 11 %
Neutro Abs: 3.6 10*3/uL (ref 1.7–7.7)
Neutrophils Relative %: 60 %
Platelets: 221 10*3/uL (ref 150–400)
RBC: 3.61 MIL/uL — ABNORMAL LOW (ref 4.22–5.81)
RDW: 15.5 % (ref 11.5–15.5)
WBC: 6.1 10*3/uL (ref 4.0–10.5)
nRBC: 0 % (ref 0.0–0.2)

## 2020-04-30 MED ORDER — SODIUM CHLORIDE 0.9 % IV SOLN
Freq: Once | INTRAVENOUS | Status: AC
  Filled 2020-04-30: qty 250

## 2020-04-30 MED ORDER — SODIUM CHLORIDE 0.9 % IV SOLN
INTRAVENOUS | Status: AC
  Filled 2020-04-30: qty 250

## 2020-04-30 MED ORDER — ACETAMINOPHEN 325 MG PO TABS
ORAL_TABLET | ORAL | Status: AC
  Filled 2020-04-30: qty 2

## 2020-04-30 MED ORDER — FAMOTIDINE 20 MG PO TABS
20.0000 mg | ORAL_TABLET | Freq: Once | ORAL | Status: AC
  Administered 2020-04-30: 20 mg via ORAL

## 2020-04-30 MED ORDER — SODIUM CHLORIDE 0.9 % IV SOLN
20.0000 mg | Freq: Once | INTRAVENOUS | Status: AC
  Administered 2020-04-30: 20 mg via INTRAVENOUS
  Filled 2020-04-30: qty 20

## 2020-04-30 MED ORDER — ACETAMINOPHEN 325 MG PO TABS
650.0000 mg | ORAL_TABLET | Freq: Once | ORAL | Status: AC
  Administered 2020-04-30: 650 mg via ORAL

## 2020-04-30 MED ORDER — DEXTROSE 5 % IV SOLN
54.0000 mg/m2 | Freq: Once | INTRAVENOUS | Status: AC
  Administered 2020-04-30: 100 mg via INTRAVENOUS
  Filled 2020-04-30: qty 30

## 2020-04-30 MED ORDER — FAMOTIDINE 20 MG PO TABS
ORAL_TABLET | ORAL | Status: AC
  Filled 2020-04-30: qty 1

## 2020-04-30 NOTE — Progress Notes (Signed)
Per Dr. Irene Limbo: okay to treat with Scr of 1.66.

## 2020-04-30 NOTE — Patient Instructions (Signed)
[facility] Discharge Instructions for Patients Receiving Chemotherapy  Today you received the following chemotherapy agents Carfilzomib (KYPROLIS).  To help prevent nausea and vomiting after your treatment, we encourage you to take your nausea medication as prescribed.  If you develop nausea and vomiting that is not controlled by your nausea medication, call the clinic.   BELOW ARE SYMPTOMS THAT SHOULD BE REPORTED IMMEDIATELY:  *FEVER GREATER THAN 100.5 F  *CHILLS WITH OR WITHOUT FEVER  NAUSEA AND VOMITING THAT IS NOT CONTROLLED WITH YOUR NAUSEA MEDICATION  *UNUSUAL SHORTNESS OF BREATH  *UNUSUAL BRUISING OR BLEEDING  TENDERNESS IN MOUTH AND THROAT WITH OR WITHOUT PRESENCE OF ULCERS  *URINARY PROBLEMS  *BOWEL PROBLEMS  UNUSUAL RASH Items with * indicate a potential emergency and should be followed up as soon as possible.  Feel free to call the clinic should you have any questions or concerns. The clinic phone number is [phone].  Please show the CHEMO ALERT CARD at check-in to the Emergency Department and triage nurse.   

## 2020-05-07 NOTE — Progress Notes (Signed)
Pharmacist Chemotherapy Monitoring - Follow Up Assessment    I verify that I have reviewed each item in the below checklist:  . Regimen for the patient is scheduled for the appropriate day and plan matches scheduled date. [name] Appropriate non-routine labs are ordered dependent on drug ordered. . If applicable, additional medications reviewed and ordered per protocol based on lifetime cumulative doses and/or treatment regimen.   Plan for follow-up and/or issues identified: No . I-vent associated with next due treatment: No . MD and/or nursing notified: No  Pearse Shiffler D 05/07/2020 4:03 PM

## 2020-05-13 NOTE — Progress Notes (Signed)
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 His labs today 12/14/2017 show hgb 8.3, platelets 524k. He is doing well overall. He has staples in his right upper leg following a recent surgery and has not been in touch with the surgeons for a f/u of this. He is not receiving physical therapy and is not getting around at home.  He notes that he was not scheduled for a post hospitalization nephrology f/u and has no PCP.  On review of systems, pt reports intermittent fatigue, SOB, pain to the left leg, dizziness, dry mouth and denies fever, chills, night sweats, dysuria and any other accompanying symptoms.   INTERVAL HISTORY:    Andres Fernandez presents today for follow up and continue management of his myeloma/plasma cell leukemia. The patient's last visit with [name] was on 04/16/20. The pt reports that he is doing well overall.  The pt reports he is good. He has no new concerns since last meeting. Pt had eaten a hamburger on Tuesday and he got sick from it. He had diarrhea, nausea and vomiting. Pt had the diarrhea, nausea and vomiting less than 6 hours after eating the hamburger. The first day the pt threw up several times then diarrhea came after about twice a day. He is just coming around and is starting to feel better. His symptoms are resolving he is feeling less nauseous, no vomiting, no fevers and no diarrhea.   Of note since the patient's last visit, pt has had Bone Density ([id number]) completed on 04/19/20 --results discussed.  Lab results today (05/14/20) of CBC w/diff and CMP is as follows: all values are WNL except for RBC at 3.87, Hemoglobin at 12.4, HCT at 36.1, CO2 at 15, BUN at 38, Creatinine at 2.14, Calcium at 8.1, AST at 8, GFR, Est Non  Af Am at 32, GFR, Est AFR Am at 37  On review of systems, pt reports nausea, vomiting, diarrhea, loose stool and denies blood/black stool, abdominal pain, cramping while having bowl movement, pain along back and any other symptoms.    REVIEW OF SYSTEMS:    A 10+ POINT REVIEW OF SYSTEMS WAS OBTAINED including neurology, dermatology, psychiatry, cardiac, respiratory, lymph, extremities, GI, GU, Musculoskeletal, constitutional, breasts, reproductive, HEENT.  All pertinent positives are noted in the HPI.  All others are negative.   MEDICAL HISTORY:  Past Medical History:  Diagnosis Date  . BPH (benign prostatic hyperplasia)   . Hepatitis C 11/27/2017  . Hypertension   . Plasma cell leukemia ([place]) 11/23/2017  . Tobacco abuse     SURGICAL HISTORY: Past Surgical History:  Procedure Laterality Date  . APPENDECTOMY    . FEMUR IM NAIL Right 11/20/2017   Procedure: RIGHT FEMORAL INTRAMEDULLARY (IM) NAIL;  Surgeon: Nicholes Stairs, MD;  Location: [place];  Service: Orthopedics;  Laterality: Right;    SOCIAL HISTORY: Social History   Socioeconomic History  . Marital status: Single    Spouse name: Not on file  . Number of children: Not on file  . Years of education: Not on file  . Highest education level: Not on file  Occupational History  . Not on file  Tobacco Use  . Smoking status: Current Every Day Smoker    Packs/day: 0.25    Types: Cigarettes  Last attempt to quit: 11/09/2017    Years since quitting: 2.5  . Smokeless tobacco: Never Used  . Tobacco comment: 1 cigarette/day as of 09/25/18  Substance and Sexual Activity  . Alcohol use: Yes  . Drug use: No  . Sexual activity: Not on file  Other Topics Concern  . Not on file  Social History Narrative  . Not on file   Social Determinants of Health   Financial Resource Strain:   . Difficulty of Paying Living Expenses:   Food Insecurity:   . Worried About Charity fundraiser in the Last Year:   . Arboriculturist in  the Last Year:   Transportation Needs:   . Film/video editor (Medical):   [name] Lack of Transportation (Non-Medical):   Physical Activity:   . Days of Exercise per Week:   . Minutes of Exercise per Session:   Stress:   . Feeling of Stress :   Social Connections:   . Frequency of Communication with Friends and Family:   . Frequency of Social Gatherings with Friends and Family:   . Attends Religious Services:   . Active Member of Clubs or Organizations:   . Attends Archivist Meetings:   [name] Marital Status:   Intimate Partner Violence:   . Fear of Current or Ex-Partner:   . Emotionally Abused:   [name] Physically Abused:   . Sexually Abused:     FAMILY HISTORY: Family History  Problem Relation Age of Onset  . COPD Mother   . Liver cancer Mother     ALLERGIES:  has No Known Allergies.  MEDICATIONS:  . Current Outpatient Medications on File Prior to Visit  Medication Sig Dispense Refill  . acyclovir (ZOVIRAX) 400 MG tablet Take 1 tablet (400 mg total) by mouth daily. 30 tablet 11  . aspirin EC 81 MG tablet Take 1 tablet (81 mg total) by mouth daily. 60 tablet 11  . cholecalciferol (VITAMIN D) 400 units TABS tablet Take 400 Units by mouth daily.    [name] dexamethasone (DECADRON) 4 MG tablet Take 5 tablets (20 mg total) by mouth once a week. (Patient not taking: Reported on 08/27/2019) 20 tablet 1  . lenalidomide (REVLIMID) 10 MG capsule Take 1 capsule (10 mg total) by mouth daily. Take for 21 days on, 7 days off, repeat every 28 days. 21 capsule 0  . Multiple Vitamins-Minerals (MULTIVITAMIN WITH MINERALS) tablet Take 1 tablet by mouth daily.    . nicotine (NICODERM CQ - DOSED IN MG/24 HOURS) 14 mg/24hr patch PLACE 1 PATCH (14 MG TOTAL) ONTO THE SKIN DAILY. 28 patch 1  . ondansetron (ZOFRAN) 4 MG tablet Take 2 tablets (8 mg total) by mouth every 8 (eight) hours as needed for nausea. 30 tablet 0  . tiZANidine (ZANAFLEX) 2 MG tablet Take 1 tablet (2 mg total) by mouth every 8  (eight) hours as needed for muscle spasms. 30 tablet 0  . Vitamin D, Ergocalciferol, (DRISDOL) 1.25 MG (50000 UNIT) CAPS capsule Take 1 capsule (50,000 Units total) by mouth once a week. 12 capsule 6   No current facility-administered medications on file prior to visit.    PHYSICAL EXAMINATION:  ECOG FS:1 - Symptomatic but completely ambulatory  Vitals:   05/14/20 1209  BP: 111/76  Pulse: 79  Resp: 18  Temp: (!) 97.5 F (36.4 C)  SpO2: 100%   Wt Readings from Last 3 Encounters:  05/14/20 152 lb 9.6 oz (69.2 kg)  04/30/20 158 lb (71.7  kg)  04/16/20 160 lb 4.8 oz (72.7 kg)   Body mass index is 23.2 kg/m.    GENERAL:alert, in no acute distress and comfortable SKIN: no acute rashes, no significant lesions EYES: conjunctiva are pink and non-injected, sclera anicteric OROPHARYNX: MMM, no exudates, no oropharyngeal erythema or ulceration NECK: supple, no JVD LYMPH:  no palpable lymphadenopathy in the cervical, axillary or inguinal regions LUNGS: clear to auscultation b/l with normal respiratory effort HEART: regular rate & rhythm ABDOMEN:  normoactive bowel sounds , non tender, not distended. Extremity: no pedal edema PSYCH: alert & oriented x 3 with fluent speech NEURO: no focal motor/sensory deficits  LABORATORY DATA:  I have reviewed the data as listed  . CBC Latest Ref Rng & Units 05/14/2020 04/30/2020 04/16/2020  WBC 4.0 - 10.5 K/uL 7.0 6.1 5.2  Hemoglobin 13.0 - 17.0 g/dL 12.4(L) 11.3(L) 12.2(L)  Hematocrit 39 - 52 % 36.1(L) 33.9(L) 36.4(L)  Platelets 150 - 400 K/uL 248 221 184   . CBC    Component Value Date/Time   WBC 7.0 05/14/2020 1151   RBC 3.87 (L) 05/14/2020 1151   HGB 12.4 (L) 05/14/2020 1151   HGB 11.9 (L) 08/27/2019 0922   HGB 8.3 (L) 12/14/2017 1230   HCT 36.1 (L) 05/14/2020 1151   HCT 23.9 (L) 12/14/2017 1230   PLT 248 05/14/2020 1151   PLT 203 08/27/2019 0922   PLT 524 (H) 12/14/2017 1230   MCV 93.3 05/14/2020 1151   MCV 88.4 12/14/2017 1230    MCH 32.0 05/14/2020 1151   MCHC 34.3 05/14/2020 1151   RDW 15.1 05/14/2020 1151   RDW 15.3 (H) 12/14/2017 1230   LYMPHSABS 2.0 05/14/2020 1151   LYMPHSABS 1.4 12/14/2017 1230   MONOABS 0.6 05/14/2020 1151   MONOABS 0.6 12/14/2017 1230   EOSABS 0.3 05/14/2020 1151   EOSABS 0.1 12/14/2017 1230   BASOSABS 0.1 05/14/2020 1151   BASOSABS 0.1 12/14/2017 1230     . CMP Latest Ref Rng & Units 05/14/2020 04/30/2020 04/16/2020  Glucose 70 - 99 mg/dL 94 116(H) 84  BUN 8 - 23 mg/dL 38(H) 23 26(H)  Creatinine 0.61 - 1.24 mg/dL 2.14(H) 1.66(H) 1.83(H)  Sodium 135 - 145 mmol/L 136 144 143  Potassium 3.5 - 5.1 mmol/L 3.9 4.4 4.3  Chloride 98 - 111 mmol/L 110 114(H) 110  CO2 22 - 32 mmol/L 15(L) 19(L) 19(L)  Calcium 8.9 - 10.3 mg/dL 8.1(L) 8.3(L) 9.0  Total Protein 6.5 - 8.1 g/dL 7.3 7.0 7.2  Total Bilirubin 0.3 - 1.2 mg/dL 0.4 0.4 0.7  Alkaline Phos 38 - 126 U/L 78 75 75  AST 15 - 41 U/L 8(L) 11(L) 11(L)  ALT 0 - 44 U/L 11 12 12    03/09/20 ([id number]) Bone Marrow Biopsy at [facility]   07/02/2019 Bone Marrow Biopsy ([id number]) at [facility]    08/07/18 BM Bx:       RADIOGRAPHIC STUDIES: I have personally reviewed the radiological images as listed and agreed with the findings in the report. DG Bone Density  Result Date: 04/19/2020 EXAM: DUAL X-RAY ABSORPTIOMETRY (DXA) FOR BONE MINERAL DENSITY IMPRESSION: Referring Physician:  Brunetta Genera Your patient completed a BMD test using Lunar IDXA DXA system ( analysis version: 16 ) manufactured by [employer]. Technologist: AW PATIENT: Name: Andres Fernandez, Andres Fernandez Patient ID: [id number] Birth Date: Nov 13, 1956 Height: 67.0 in. Sex: Male Measured: 04/19/2020 Weight: 163.0 lbs. Indications: Bone Marrow Transplant, Revlimid Fractures: Treatments: Calcium (E943.0), Multivitamin, Vitamin D (E933.5) ASSESSMENT: The BMD measured  at AP Spine L1-L2 is 0.845 g/cm2 with a T-score of -2.7. This patient is considered osteoporotic according  to the [facility]) criteria. The scan quality is good. Right femur was excluded due to surgical hardware. L-3 and L-4 were excluded due to degenerative changes. Site Region Measured Date Measured Age YA BMD Significant CHANGE T-score AP Spine   L1-L2  04/19/2020    62.8         -2.7    0.845 g/cm2 Left Femur Neck   04/19/2020    62.8         -1.8    0.791 g/cm2 Left Femur Total  5/19/2020    62.8         -1.2    0.862 g/cm2 World Health Organization [facility]) criteria for post-menopausal, Caucasian Women: Normal       T-score at or above -1 SD Osteopenia   T-score between -1 and -2.5 SD Osteoporosis T-score at or below -2.5 SD RECOMMENDATION: 1. All patients should optimize calcium and vitamin D intake. 2. Consider FDA approved medical therapies in postmenopausal women and men aged 59 years and older, based on the following: a. A hip or vertebral (clinical or morphometric) fracture b. T- score < or = -2.5 at the femoral neck or spine after appropriate evaluation to exclude secondary causes c. Low bone mass (T-score between -1.0 and -2.5 at the femoral neck or spine) and a 10 year probability of a hip fracture > or = 3% or a 10 year probability of a major osteoporosis-related fracture > or = 20% based on the [place]-adapted WHO algorithm d. Clinician judgment and/or patient preferences may indicate treatment for people with 10-year fracture probabilities above or below these levels FOLLOW-UP: Patients with diagnosis of osteoporosis or at high risk for fracture should have regular bone mineral density tests. For patients eligible for Medicare, routine testing is allowed once every 2 years. The testing frequency can be increased to one year for patients who have rapidly progressing disease, those who are receiving or discontinuing medical therapy to restore bone mass, or have additional risk factors. I have reviewed this report and agree with the above findings. [facility] Radiology Electronically Signed    By: Lowella Grip III M.D.   On: 04/19/2020 10:38    ASSESSMENT & PLAN:   64 y.o. 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   Initial BM Bx showed >95% involvement with kappa restricted serum free light chains. 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 MoL Cy-   08/07/18 BM Bx revealed normocellular bone marrow with trilineage hematopoiesis and 1% plasma cells which indicates the pt is in remission 08/06/18 PET/CT revealed  Expansile lytic lesion involving the left iliac bone, unchanged. Lytic lesion involving the sternum, unchanged. While both are suspicious for myeloma, neither lesion is FDG avid. No hypermetabolic osseous lesions in the visualized axial and appendicular skeleton. Please note that the bilateral lower extremities were not imaged. No suspicious lymphadenopathy. Spleen is normal in size.    Pt began Ninlaro 72m maintenance after pt finished 13 cycles of CyBorD and BM bx showed only 1% plasma cells, then resumed CyBorD and held Ninlaro in anticipation of his tandem bone marrow transplants beginning in late December 2019  with [facility]. Blinda [facility]   11/19/18 High dose chemotherapy with Melphalan 160m/m2 and autologous stem cell rescue  03/20/19 High dose chemotherapy with Melphalan 2059mm2 and autologous stem cell rescue. Non-infectious diarrhea, uncomplicated course otherwise.  07/02/2019 PET scan with results revealing "1. No FDG avid bone lesions are identified. 2.  Asymmetric uptake within the right, greater than left, palatine tonsils with mild fullness of the right tonsillar soft tissue. ENT consult vs attention on follow up suggested. 3.  Innumerable mixed sclerotic  and lytic lesions throughout the axial and appendicular skeleton without associated hypermetabolic activity. 4.  Ancillary CT findings as above."  07/02/2019 bone marrow biopsy ([id number])with results showing: "Trilineage hematopoieses with maturation. No plasma cell myeloma identified. Mild anemia and No Rouleaux or circulating plasma cells identified in the peripheral blood."  2) s/p Prophylactic IM nailing for rt femur  [facility]patient previously reported improvement and is currently progressively back to full time work.  3) Thrombocytopenia resolved   4) Renal insufficiency -- likely primarily related to Myeloma. stable Plan -maintain follow up with nephrology referral to Dr CoMarval RegalCNarda Amberidney for continued treatment -Pt completed 8 weeks of maverick for hep c with Dr CoLinus Salmonsn 07/08/18.  -Kidney numbers improved. Creatinine in the 1.7-2.2 range   5) h/o tobacco abuse-counseled on smoking cessation -Currently not smoking.  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 -Recommended that the pt continue to eat well, drink at least 48-64 oz of water each day, and walk 20-30 minutes each day.  -Recommends getting enough calcium and Vitamin D in diet -Recommend taking OTC probiotics (infant probiotics with yogurt)   -Recommends staying hydrated -Recommends holding treatment for today due to nausea/vomting and diarrhea due to food poisoning. -If having fevers, blood in stool, increase cramping with bowl movements, let Dr. KaIrene Limbonow  -Will get Vitamin D levels with next labs -Goal Vitamin D close to 90 -Will see back in 4 weeks with labs   FOLLOW UP: Holding Carfilzomib today due to food poisoning Labs today - stool testing Plz schedule next 4 Carfilzomib infusion q2weeks with port flush and labs MD visit in 4 weeks  The total time spent in the appt was 30 minutes and more than 50% was on counseling and direct patient cares.  All of the patient's questions were answered with apparent satisfaction. The patient knows to call the clinic with any problems, questions or concerns.  GaSullivan LoneD MSHenlawsonAHIVMS SCWestern State HospitalTUniversity Of Mississippi Medical Center - Grenadaematology/Oncology Physician CoW.[facility](Office):       [phone]  cell):  [phone] (Fax):           [phone]  I, Dawayne Cirri am acting as a scribe for Dr. Sullivan Lone.   .I have reviewed the above documentation for accuracy and completeness, and I agree with the above. Brunetta Genera MD

## 2020-05-14 ENCOUNTER — Other Ambulatory Visit: Payer: Self-pay

## 2020-05-14 ENCOUNTER — Inpatient Hospital Stay (HOSPITAL_BASED_OUTPATIENT_CLINIC_OR_DEPARTMENT_OTHER): Payer: BC Managed Care – PPO | Admitting: Hematology

## 2020-05-14 ENCOUNTER — Inpatient Hospital Stay: Payer: BC Managed Care – PPO

## 2020-05-14 ENCOUNTER — Inpatient Hospital Stay: Payer: BC Managed Care – PPO | Attending: Hematology

## 2020-05-14 VITALS — BP 111/76 | HR 79 | Temp 97.5°F | Resp 18 | Ht 68.0 in | Wt 152.6 lb

## 2020-05-14 DIAGNOSIS — Z5111 Encounter for antineoplastic chemotherapy: Secondary | ICD-10-CM

## 2020-05-14 DIAGNOSIS — C9010 Plasma cell leukemia not having achieved remission: Secondary | ICD-10-CM

## 2020-05-14 DIAGNOSIS — M79605 Pain in left leg: Secondary | ICD-10-CM | POA: Insufficient documentation

## 2020-05-14 DIAGNOSIS — I10 Essential (primary) hypertension: Secondary | ICD-10-CM | POA: Diagnosis not present

## 2020-05-14 DIAGNOSIS — Z7952 Long term (current) use of systemic steroids: Secondary | ICD-10-CM | POA: Diagnosis not present

## 2020-05-14 DIAGNOSIS — D638 Anemia in other chronic diseases classified elsewhere: Secondary | ICD-10-CM | POA: Insufficient documentation

## 2020-05-14 DIAGNOSIS — Z79899 Other long term (current) drug therapy: Secondary | ICD-10-CM | POA: Insufficient documentation

## 2020-05-14 DIAGNOSIS — R5383 Other fatigue: Secondary | ICD-10-CM | POA: Diagnosis not present

## 2020-05-14 DIAGNOSIS — C9000 Multiple myeloma not having achieved remission: Secondary | ICD-10-CM | POA: Insufficient documentation

## 2020-05-14 DIAGNOSIS — F17210 Nicotine dependence, cigarettes, uncomplicated: Secondary | ICD-10-CM | POA: Insufficient documentation

## 2020-05-14 DIAGNOSIS — R42 Dizziness and giddiness: Secondary | ICD-10-CM | POA: Diagnosis not present

## 2020-05-14 DIAGNOSIS — R682 Dry mouth, unspecified: Secondary | ICD-10-CM | POA: Insufficient documentation

## 2020-05-14 DIAGNOSIS — N289 Disorder of kidney and ureter, unspecified: Secondary | ICD-10-CM | POA: Diagnosis not present

## 2020-05-14 DIAGNOSIS — Z5112 Encounter for antineoplastic immunotherapy: Secondary | ICD-10-CM | POA: Insufficient documentation

## 2020-05-14 DIAGNOSIS — A059 Bacterial foodborne intoxication, unspecified: Secondary | ICD-10-CM | POA: Diagnosis not present

## 2020-05-14 DIAGNOSIS — R0602 Shortness of breath: Secondary | ICD-10-CM | POA: Insufficient documentation

## 2020-05-14 DIAGNOSIS — C9011 Plasma cell leukemia in remission: Secondary | ICD-10-CM

## 2020-05-14 DIAGNOSIS — M810 Age-related osteoporosis without current pathological fracture: Secondary | ICD-10-CM | POA: Diagnosis not present

## 2020-05-14 DIAGNOSIS — N400 Enlarged prostate without lower urinary tract symptoms: Secondary | ICD-10-CM | POA: Insufficient documentation

## 2020-05-14 DIAGNOSIS — Z7982 Long term (current) use of aspirin: Secondary | ICD-10-CM | POA: Diagnosis not present

## 2020-05-14 LAB — CBC WITH DIFFERENTIAL/PLATELET
Abs Immature Granulocytes: 0.03 10*3/uL (ref 0.00–0.07)
Basophils Absolute: 0.1 10*3/uL (ref 0.0–0.1)
Basophils Relative: 1 %
Eosinophils Absolute: 0.3 10*3/uL (ref 0.0–0.5)
Eosinophils Relative: 4 %
HCT: 36.1 % — ABNORMAL LOW (ref 39.0–52.0)
Hemoglobin: 12.4 g/dL — ABNORMAL LOW (ref 13.0–17.0)
Immature Granulocytes: 0 %
Lymphocytes Relative: 28 %
Lymphs Abs: 2 10*3/uL (ref 0.7–4.0)
MCH: 32 pg (ref 26.0–34.0)
MCHC: 34.3 g/dL (ref 30.0–36.0)
MCV: 93.3 fL (ref 80.0–100.0)
Monocytes Absolute: 0.6 10*3/uL (ref 0.1–1.0)
Monocytes Relative: 8 %
Neutro Abs: 4.1 10*3/uL (ref 1.7–7.7)
Neutrophils Relative %: 59 %
Platelets: 248 10*3/uL (ref 150–400)
RBC: 3.87 MIL/uL — ABNORMAL LOW (ref 4.22–5.81)
RDW: 15.1 % (ref 11.5–15.5)
WBC: 7 10*3/uL (ref 4.0–10.5)
nRBC: 0 % (ref 0.0–0.2)

## 2020-05-14 LAB — CMP (CANCER CENTER ONLY)
ALT: 11 U/L (ref 0–44)
AST: 8 U/L — ABNORMAL LOW (ref 15–41)
Albumin: 4 g/dL (ref 3.5–5.0)
Alkaline Phosphatase: 78 U/L (ref 38–126)
Anion gap: 11 (ref 5–15)
BUN: 38 mg/dL — ABNORMAL HIGH (ref 8–23)
CO2: 15 mmol/L — ABNORMAL LOW (ref 22–32)
Calcium: 8.1 mg/dL — ABNORMAL LOW (ref 8.9–10.3)
Chloride: 110 mmol/L (ref 98–111)
Creatinine: 2.14 mg/dL — ABNORMAL HIGH (ref 0.61–1.24)
GFR, Est AFR Am: 37 mL/min — ABNORMAL LOW (ref >60)
GFR, Estimated: 32 mL/min — ABNORMAL LOW (ref >60)
Glucose, Bld: 94 mg/dL (ref 70–99)
Potassium: 3.9 mmol/L (ref 3.5–5.1)
Sodium: 136 mmol/L (ref 135–145)
Total Bilirubin: 0.4 mg/dL (ref 0.3–1.2)
Total Protein: 7.3 g/dL (ref 6.5–8.1)

## 2020-05-17 ENCOUNTER — Other Ambulatory Visit: Payer: BC Managed Care – PPO

## 2020-05-17 ENCOUNTER — Telehealth: Payer: Self-pay

## 2020-05-17 NOTE — Telephone Encounter (Signed)
Received call from patient requesting all his appts. be scheduled after 9:00 am due to transportation availability. Scheduling notified.

## 2020-05-26 ENCOUNTER — Other Ambulatory Visit: Payer: Self-pay | Admitting: *Deleted

## 2020-05-26 DIAGNOSIS — C9010 Plasma cell leukemia not having achieved remission: Secondary | ICD-10-CM

## 2020-05-26 MED ORDER — LENALIDOMIDE 10 MG PO CAPS: 10.0000 mg | ORAL_CAPSULE | Freq: Every day | ORAL | 0 refills | Status: DC

## 2020-05-26 NOTE — Telephone Encounter (Signed)
Received faxed refill request from [facility] [employer] for Revlimid 10 mg Refilled per Verbal order from Dr. Katheran Awe # [id number], 05/26/20

## 2020-06-03 ENCOUNTER — Telehealth: Payer: Self-pay | Admitting: Hematology

## 2020-06-03 NOTE — Telephone Encounter (Signed)
Scheduled appt per 6/23 sch message - pt is aware of appts for 6/25

## 2020-06-03 NOTE — Telephone Encounter (Signed)
Pt called in to reschedule appts on 6/25 to the following week . Unable to come in due to transportation.  Pt aware of new appt.

## 2020-06-04 ENCOUNTER — Inpatient Hospital Stay: Payer: BC Managed Care – PPO

## 2020-06-08 ENCOUNTER — Inpatient Hospital Stay: Payer: BC Managed Care – PPO

## 2020-06-08 ENCOUNTER — Other Ambulatory Visit: Payer: Self-pay

## 2020-06-08 VITALS — BP 113/70 | HR 83 | Temp 98.2°F | Resp 18 | Wt 157.8 lb

## 2020-06-08 DIAGNOSIS — Z5111 Encounter for antineoplastic chemotherapy: Secondary | ICD-10-CM

## 2020-06-08 DIAGNOSIS — C9010 Plasma cell leukemia not having achieved remission: Secondary | ICD-10-CM | POA: Diagnosis not present

## 2020-06-08 DIAGNOSIS — Z9484 Stem cells transplant status: Secondary | ICD-10-CM

## 2020-06-08 DIAGNOSIS — Z7189 Other specified counseling: Secondary | ICD-10-CM

## 2020-06-08 LAB — CBC WITH DIFFERENTIAL/PLATELET
Abs Immature Granulocytes: 0.04 10*3/uL (ref 0.00–0.07)
Basophils Absolute: 0.2 10*3/uL — ABNORMAL HIGH (ref 0.0–0.1)
Basophils Relative: 2 %
Eosinophils Absolute: 0.2 10*3/uL (ref 0.0–0.5)
Eosinophils Relative: 3 %
HCT: 35.6 % — ABNORMAL LOW (ref 39.0–52.0)
Hemoglobin: 12 g/dL — ABNORMAL LOW (ref 13.0–17.0)
Immature Granulocytes: 1 %
Lymphocytes Relative: 28 %
Lymphs Abs: 1.9 10*3/uL (ref 0.7–4.0)
MCH: 31.9 pg (ref 26.0–34.0)
MCHC: 33.7 g/dL (ref 30.0–36.0)
MCV: 94.7 fL (ref 80.0–100.0)
Monocytes Absolute: 0.6 10*3/uL (ref 0.1–1.0)
Monocytes Relative: 9 %
Neutro Abs: 3.9 10*3/uL (ref 1.7–7.7)
Neutrophils Relative %: 57 %
Platelets: 212 10*3/uL (ref 150–400)
RBC: 3.76 MIL/uL — ABNORMAL LOW (ref 4.22–5.81)
RDW: 15.8 % — ABNORMAL HIGH (ref 11.5–15.5)
WBC: 6.8 10*3/uL (ref 4.0–10.5)
nRBC: 0 % (ref 0.0–0.2)

## 2020-06-08 LAB — CMP (CANCER CENTER ONLY)
ALT: 12 U/L (ref 0–44)
AST: 10 U/L — ABNORMAL LOW (ref 15–41)
Albumin: 3.9 g/dL (ref 3.5–5.0)
Alkaline Phosphatase: 70 U/L (ref 38–126)
Anion gap: 12 (ref 5–15)
BUN: 24 mg/dL — ABNORMAL HIGH (ref 8–23)
CO2: 18 mmol/L — ABNORMAL LOW (ref 22–32)
Calcium: 9.2 mg/dL (ref 8.9–10.3)
Chloride: 114 mmol/L — ABNORMAL HIGH (ref 98–111)
Creatinine: 1.74 mg/dL — ABNORMAL HIGH (ref 0.61–1.24)
GFR, Est AFR Am: 48 mL/min — ABNORMAL LOW (ref >60)
GFR, Estimated: 41 mL/min — ABNORMAL LOW (ref >60)
Glucose, Bld: 109 mg/dL — ABNORMAL HIGH (ref 70–99)
Potassium: 3.9 mmol/L (ref 3.5–5.1)
Sodium: 144 mmol/L (ref 135–145)
Total Bilirubin: 0.3 mg/dL (ref 0.3–1.2)
Total Protein: 7 g/dL (ref 6.5–8.1)

## 2020-06-08 MED ORDER — DEXTROSE 5 % IV SOLN
56.0000 mg/m2 | Freq: Once | INTRAVENOUS | Status: AC
  Administered 2020-06-08: 100 mg via INTRAVENOUS
  Filled 2020-06-08: qty 30

## 2020-06-08 MED ORDER — FAMOTIDINE 20 MG PO TABS
20.0000 mg | ORAL_TABLET | Freq: Once | ORAL | Status: AC
  Administered 2020-06-08: 20 mg via ORAL

## 2020-06-08 MED ORDER — SODIUM CHLORIDE 0.9 % IV SOLN
Freq: Once | INTRAVENOUS | Status: AC
  Filled 2020-06-08: qty 250

## 2020-06-08 MED ORDER — SODIUM CHLORIDE 0.9 % IV SOLN
20.0000 mg | Freq: Once | INTRAVENOUS | Status: AC
  Administered 2020-06-08: 20 mg via INTRAVENOUS
  Filled 2020-06-08: qty 2
  Filled 2020-06-08: qty 20
  Filled 2020-06-08: qty 2

## 2020-06-08 MED ORDER — ACETAMINOPHEN 325 MG PO TABS
ORAL_TABLET | ORAL | Status: AC
  Filled 2020-06-08: qty 2

## 2020-06-08 MED ORDER — ACETAMINOPHEN 325 MG PO TABS
650.0000 mg | ORAL_TABLET | Freq: Once | ORAL | Status: AC
  Administered 2020-06-08: 650 mg via ORAL

## 2020-06-08 MED ORDER — FAMOTIDINE 20 MG PO TABS
ORAL_TABLET | ORAL | Status: AC
  Filled 2020-06-08: qty 1

## 2020-06-08 NOTE — Progress Notes (Signed)
Per Dr Irene Limbo, ok to treat with creatinine 1.74

## 2020-06-08 NOTE — Patient Instructions (Signed)
[facility] Discharge Instructions for Patients Receiving Chemotherapy  Today you received the following chemotherapy agents: carfilzomib.  To help prevent nausea and vomiting after your treatment, we encourage you to take your nausea medication as directed.   If you develop nausea and vomiting that is not controlled by your nausea medication, call the clinic.   BELOW ARE SYMPTOMS THAT SHOULD BE REPORTED IMMEDIATELY:  *FEVER GREATER THAN 100.5 F  *CHILLS WITH OR WITHOUT FEVER  NAUSEA AND VOMITING THAT IS NOT CONTROLLED WITH YOUR NAUSEA MEDICATION  *UNUSUAL SHORTNESS OF BREATH  *UNUSUAL BRUISING OR BLEEDING  TENDERNESS IN MOUTH AND THROAT WITH OR WITHOUT PRESENCE OF ULCERS  *URINARY PROBLEMS  *BOWEL PROBLEMS  UNUSUAL RASH Items with * indicate a potential emergency and should be followed up as soon as possible.  Feel free to call the clinic should you have any questions or concerns. The clinic phone number is [phone].  Please show the CHEMO ALERT CARD at check-in to the Emergency Department and triage nurse.   

## 2020-06-09 LAB — MULTIPLE MYELOMA PANEL, SERUM
Albumin SerPl Elph-Mcnc: 3.8 g/dL (ref 2.9–4.4)
Albumin/Glob SerPl: 1.4 (ref 0.7–1.7)
Alpha 1: 0.2 g/dL (ref 0.0–0.4)
Alpha2 Glob SerPl Elph-Mcnc: 1.1 g/dL — ABNORMAL HIGH (ref 0.4–1.0)
B-Globulin SerPl Elph-Mcnc: 0.8 g/dL (ref 0.7–1.3)
Gamma Glob SerPl Elph-Mcnc: 0.8 g/dL (ref 0.4–1.8)
Globulin, Total: 2.9 g/dL (ref 2.2–3.9)
IgA: 79 mg/dL (ref 61–437)
IgG (Immunoglobin G), Serum: 764 mg/dL (ref 603–1613)
IgM (Immunoglobulin M), Srm: 13 mg/dL — ABNORMAL LOW (ref 20–172)
Total Protein ELP: 6.7 g/dL (ref 6.0–8.5)

## 2020-06-09 LAB — KAPPA/LAMBDA LIGHT CHAINS
Kappa free light chain: 49.9 mg/L — ABNORMAL HIGH (ref 3.3–19.4)
Kappa, lambda light chain ratio: 1.95 — ABNORMAL HIGH (ref 0.26–1.65)
Lambda free light chains: 25.6 mg/L (ref 5.7–26.3)

## 2020-06-18 ENCOUNTER — Other Ambulatory Visit: Payer: BC Managed Care – PPO

## 2020-06-18 ENCOUNTER — Ambulatory Visit: Payer: BC Managed Care – PPO | Admitting: Hematology

## 2020-06-18 ENCOUNTER — Ambulatory Visit: Payer: BC Managed Care – PPO

## 2020-06-23 NOTE — Progress Notes (Signed)
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 His labs today 12/14/2017 show hgb 8.3, platelets 524k. He is doing well overall. He has staples in his right upper leg following a recent surgery and has not been in touch with the surgeons for a f/u of this. He is not receiving physical therapy and is not getting around at home.  He notes that he was not scheduled for a post hospitalization nephrology f/u and has no PCP.  On review of systems, pt reports intermittent fatigue, SOB, pain to the left leg, dizziness, dry mouth and denies fever, chills, night sweats, dysuria and any other accompanying symptoms.   INTERVAL HISTORY:    Andres Fernandez presents today for follow up and continue management of his myeloma/plasma cell leukemia. The patient's last visit with [name] was on 05/14/20. The pt reports that he is doing well overall.  The pt reports he is good. His food poisoning symptoms have gone away and he is feeling much better. Pt is having phlegm for about 2 months. He is coughing small amounts up and has no sinus symptoms. Pt has been having pain in the middle of his back where he gets a sharp pain. He has not had trouble taking his Revlimid. Pt has had some constipation from the Revlimid but not bad. He is currently smoking 3 cigarettes daily.    Lab results today (06/25/20) of CBC w/diff and CMP is as follows: all values are WNL except for RBC at 3.94, Hemoglobin at 12.6, HCT at 36.8, RDW at 15.7, chloride at 114, CO2 at 17, BUN at 32, Creatinine at 1.86, AST at 13, GFR, Est Non Af Am at 38, GFR, Est AFR Am at 44 06/25/20 of Kappa/Lambda Light Chains - normal K/L ratio.  On review of systems, pt reports staying hydrated, phlegm, healthy appetite  and denies fevers, chills, new bone pains, pedal edema, abdominal pain, diarrhea, fatigue and any other symptoms.   REVIEW OF SYSTEMS:   A 10+ POINT REVIEW OF SYSTEMS WAS OBTAINED including neurology, dermatology, psychiatry, cardiac, respiratory, lymph, extremities, GI, GU, Musculoskeletal, constitutional, breasts, reproductive, HEENT.  All pertinent positives are noted in the HPI.  All others are negative.   MEDICAL HISTORY:  Past Medical History:  Diagnosis Date  . BPH (benign prostatic hyperplasia)   . Hepatitis C 11/27/2017  . Hypertension   . Plasma cell leukemia ([place]) 11/23/2017  . Tobacco abuse     SURGICAL HISTORY: Past Surgical History:  Procedure Laterality Date  . APPENDECTOMY    . FEMUR IM NAIL Right 11/20/2017   Procedure: RIGHT FEMORAL INTRAMEDULLARY (IM) NAIL;  Surgeon: Nicholes Stairs, MD;  Location: [facility];  Service: Orthopedics;  Laterality: Right;    SOCIAL HISTORY: Social History   Socioeconomic History  . Marital status: Single    Spouse name: Not on file  . Number of children: Not on file  . Years of education: Not on file  . Highest education level: Not on file  Occupational History  . Not on file  Tobacco Use  . Smoking status: Current Every Day Smoker    Packs/day: 0.25    Types: Cigarettes    Last attempt to quit: 11/09/2017    Years since quitting: 2.6  . Smokeless tobacco:  Never Used  . Tobacco comment: 1 cigarette/day as of 09/25/18  Vaping Use  . Vaping Use: Never used  Substance and Sexual Activity  . Alcohol use: Yes  . Drug use: No  . Sexual activity: Not on file  Other Topics Concern  . Not on file  Social History Narrative  . Not on file   Social Determinants of Health   Financial Resource Strain:   . Difficulty of Paying Living Expenses:   Food Insecurity:   . Worried About Charity fundraiser in the Last Year:   . Arboriculturist in the Last Year:   Transportation Needs:   . Film/video editor (Medical):   [name]  Lack of Transportation (Non-Medical):   Physical Activity:   . Days of Exercise per Week:   . Minutes of Exercise per Session:   Stress:   . Feeling of Stress :   Social Connections:   . Frequency of Communication with Friends and Family:   . Frequency of Social Gatherings with Friends and Family:   . Attends Religious Services:   . Active Member of Clubs or Organizations:   . Attends Archivist Meetings:   [name] Marital Status:   Intimate Partner Violence:   . Fear of Current or Ex-Partner:   . Emotionally Abused:   [name] Physically Abused:   . Sexually Abused:     FAMILY HISTORY: Family History  Problem Relation Age of Onset  . COPD Mother   . Liver cancer Mother     ALLERGIES:  has No Known Allergies.  MEDICATIONS:  . Current Outpatient Medications on File Prior to Visit  Medication Sig Dispense Refill  . acyclovir (ZOVIRAX) 400 MG tablet Take 1 tablet (400 mg total) by mouth daily. 30 tablet 11  . aspirin EC 81 MG tablet Take 1 tablet (81 mg total) by mouth daily. 60 tablet 11  . cholecalciferol (VITAMIN D) 400 units TABS tablet Take 400 Units by mouth daily.    [name] dexamethasone (DECADRON) 4 MG tablet Take 5 tablets (20 mg total) by mouth once a week. (Patient not taking: Reported on 08/27/2019) 20 tablet 1  . lenalidomide (REVLIMID) 10 MG capsule Take 1 capsule (10 mg total) by mouth daily. Take for 21 days on, 7 days off, repeat every 28 days. 21 capsule 0  . Multiple Vitamins-Minerals (MULTIVITAMIN WITH MINERALS) tablet Take 1 tablet by mouth daily.    . nicotine (NICODERM CQ - DOSED IN MG/24 HOURS) 14 mg/24hr patch PLACE 1 PATCH (14 MG TOTAL) ONTO THE SKIN DAILY. 28 patch 1  . ondansetron (ZOFRAN) 4 MG tablet Take 2 tablets (8 mg total) by mouth every 8 (eight) hours as needed for nausea. 30 tablet 0  . tiZANidine (ZANAFLEX) 2 MG tablet Take 1 tablet (2 mg total) by mouth every 8 (eight) hours as needed for muscle spasms. 30 tablet 0  . Vitamin D, Ergocalciferol,  (DRISDOL) 1.25 MG (50000 UNIT) CAPS capsule Take 1 capsule (50,000 Units total) by mouth once a week. 12 capsule 6   No current facility-administered medications on file prior to visit.    PHYSICAL EXAMINATION:  ECOG FS:1 - Symptomatic but completely ambulatory  Vitals:   06/25/20 0935  BP: 127/85  Pulse: 70  Resp: 18  Temp: 97.9 F (36.6 C)  SpO2: 100%   Wt Readings from Last 3 Encounters:  06/25/20 157 lb 1.6 oz (71.3 kg)  06/08/20 157 lb 12 oz (71.6 kg)  05/14/20 152 lb 9.6  oz (69.2 kg)   Body mass index is 23.89 kg/m.    GENERAL:alert, in no acute distress and comfortable SKIN: no acute rashes, no significant lesions EYES: conjunctiva are pink and non-injected, sclera anicteric OROPHARYNX: MMM, no exudates, no oropharyngeal erythema or ulceration NECK: supple, no JVD LYMPH:  no palpable lymphadenopathy in the cervical, axillary or inguinal regions LUNGS: clear to auscultation b/l with normal respiratory effort HEART: regular rate & rhythm ABDOMEN:  normoactive bowel sounds , non tender, not distended. Extremity: no pedal edema PSYCH: alert & oriented x 3 with fluent speech NEURO: no focal motor/sensory deficits  LABORATORY DATA:  I have reviewed the data as listed  . CBC Latest Ref Rng & Units 06/25/2020 06/08/2020 05/14/2020  WBC 4.0 - 10.5 K/uL 5.8 6.8 7.0  Hemoglobin 13.0 - 17.0 g/dL 12.6(L) 12.0(L) 12.4(L)  Hematocrit 39 - 52 % 36.8(L) 35.6(L) 36.1(L)  Platelets 150 - 400 K/uL 193 212 248   . CBC    Component Value Date/Time   WBC 5.8 06/25/2020 0914   RBC 3.94 (L) 06/25/2020 0914   HGB 12.6 (L) 06/25/2020 0914   HGB 11.9 (L) 08/27/2019 0922   HGB 8.3 (L) 12/14/2017 1230   HCT 36.8 (L) 06/25/2020 0914   HCT 23.9 (L) 12/14/2017 1230   PLT 193 06/25/2020 0914   PLT 203 08/27/2019 0922   PLT 524 (H) 12/14/2017 1230   MCV 93.4 06/25/2020 0914   MCV 88.4 12/14/2017 1230   MCH 32.0 06/25/2020 0914   MCHC 34.2 06/25/2020 0914   RDW 15.7 (H) 06/25/2020  0914   RDW 15.3 (H) 12/14/2017 1230   LYMPHSABS 1.9 06/25/2020 0914   LYMPHSABS 1.4 12/14/2017 1230   MONOABS 0.6 06/25/2020 0914   MONOABS 0.6 12/14/2017 1230   EOSABS 0.5 06/25/2020 0914   EOSABS 0.1 12/14/2017 1230   BASOSABS 0.1 06/25/2020 0914   BASOSABS 0.1 12/14/2017 1230     . CMP Latest Ref Rng & Units 06/25/2020 06/08/2020 05/14/2020  Glucose 70 - 99 mg/dL 81 109(H) 94  BUN 8 - 23 mg/dL 32(H) 24(H) 38(H)  Creatinine 0.61 - 1.24 mg/dL 1.86(H) 1.74(H) 2.14(H)  Sodium 135 - 145 mmol/L 142 144 136  Potassium 3.5 - 5.1 mmol/L 4.6 3.9 3.9  Chloride 98 - 111 mmol/L 114(H) 114(H) 110  CO2 22 - 32 mmol/L 17(L) 18(L) 15(L)  Calcium 8.9 - 10.3 mg/dL 9.3 9.2 8.1(L)  Total Protein 6.5 - 8.1 g/dL 7.6 7.0 7.3  Total Bilirubin 0.3 - 1.2 mg/dL 0.4 0.3 0.4  Alkaline Phos 38 - 126 U/L 71 70 78  AST 15 - 41 U/L 13(L) 10(L) 8(L)  ALT 0 - 44 U/L 20 12 11    03/09/20 ([id number]) [facility] Marrow Bi[facility] at [facility]   07/02/2019 Bone Marrow Biopsy ([id number]) at [facility]    08/07/18 BM Bx:       RADIOGRAPHIC STUDIES: I have personally reviewed the radiological images as listed and agreed with the findings in the report. No results found.  ASSESSMENT & PLAN:   64 y.o. 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   Initial BM Bx showed >95% involvement with kappa restricted serum free light chains. 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 MoL Cy-  08/07/18 BM Bx revealed normocellular bone marrow with trilineage hematopoiesis and 1% plasma cells which indicates the pt is in remission 08/06/18 PET/CT revealed  Expansile lytic lesion involving the left iliac bone,  unchanged. Lytic lesion involving the sternum, unchanged. While both are suspicious for myeloma, neither lesion is FDG avid. No hypermetabolic osseous lesions in the visualized axial and appendicular skeleton. Please note that the bilateral lower extremities were not imaged. No suspicious lymphadenopathy. Spleen is normal in size.    Pt began Ninlaro 8m maintenance after pt finished 13 cycles of CyBorD and BM bx showed only 1% plasma cells, then resumed CyBorD and held Ninlaro in anticipation of his tandem bone marrow transplants [facility] in late December 2019 [facility]  11/19/18 High dose chemotherapy with Melphalan 1466mm2 and autologous stem cell rescue  03/20/19 High dose chemotherapy with Melphalan 20044m2 and autologous stem cell rescue. Non-infectious diarrhea, uncomplicated course otherwise.  07/02/2019 PET scan with results revealing "1. No FDG avid bone lesions are identified. 2.  Asymmetric uptake within the right, greater than left, palatine tonsils with mild fullness of the right tonsillar soft tissue. ENT consult vs attention on follow up suggested. 3.  Innumerable mixed sclerotic and lytic lesions throughout the axial and appendicular skeleton without associated hypermetabolic activity. 4.  Ancillary CT findings as above."  07/02/2019 bone marrow biopsy ([id number])with results showing: "Trilineage hematopoieses with maturation. No plasma cell myeloma identified. Mild anemia and No Rouleaux or circulating plasma cells identified in the peripheral blood."  2) s/p Prophylactic IM nailing for rt femur  [facility] reported improvement and is currently progressively back to full time work.  3) Thrombocytopenia resolved   4) Renal insufficiency -- likely primarily related to Myeloma. stable Plan -maintain follow up with nephrology referral to Dr ColMarval RegalaNarda Amberdney for continued  treatment -Pt completed 8 weeks of maverick for hep c with Dr ComLinus Salmons 07/08/18.  -Kidney numbers improved. Creatinine in the 1.7-2.2 range   5) h/o tobacco abuse-counseled on smoking cessation -Currently not smoking.  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 -Hgb improved to 13.4   -monitor  PLAN: -Discussed pt labwork today, 06/25/20; of CBC w/diff and CMP is as follows: all values are WNL except for RBC at 3.94, Hemoglobin at 12.6, HCT at 36.8, RDW at 15.7, chloride at 114, CO2 at 17, BUN at 32, Creatinine at 1.86, AST at 13, GFR, Est Non Af Am at 38, GFR, Est AFR Am at 44 -Discussed 06/25/20 of Kappa/Lambda Light Chains - normal K/L ratio. -Advised on nicotine patch, nicotine inhaler, nicotine gum -Advised on phlegm- mild mucosa, or allergy  -Advised on osteoporosis -pt is on bone strengthening medication  -Advised on steam inhaler, and salt and baking soda mouth wash for phlegm  -The pt has no prohibitive toxicities from continuing C9D1 of maintainence Carfilzomib at this time  -Continue Pamidronate q8 weeks -Recommends getting enough calcium, Vitamin D -Recommended that the pt continue to eat well, drink at least 48-64 oz of water each day, and walk 20-30 minutes each day.  -Recommends staying hydrated and staying active  -If having fevers, blood in stool, increase cramping with bowl movements, let Dr. KalIrene Limboow  -Will prescribe 7mg29mcotine patch  -Will get Vitamin D levels with next labs -Goal Vitamin D close to 90 -Will see back in 6 weeks  with labs   FOLLOW UP: Plz schedule next 6 Carfilzomib infusions q2weeks with port flush and labs Continue Pamidronate q8weeks plz schedule next 3 infusions MD visit in 6 weeks  . The total time spent in the appointment was 30 minutes and more than 50% was on counseling and direct patient cares, ordering and mx of chemotherapy    All of the patient's  questions were answered with apparent satisfaction. The patient knows to call the clinic with any problems, questions or concerns.  Sullivan Lone MD [place] AAHIVMS [facility] Hematology/Oncology Physician [facility]  (Office):       [phone] (Work cell):  [phone] (Fax):           [phone]  I, Dawayne Cirri am acting as a scribe for Dr. Sullivan Lone.   .I have reviewed the above documentation for accuracy and completeness, and I agree with the above. Brunetta Genera MD

## 2020-06-24 ENCOUNTER — Other Ambulatory Visit: Payer: Self-pay | Admitting: *Deleted

## 2020-06-24 DIAGNOSIS — C9010 Plasma cell leukemia not having achieved remission: Secondary | ICD-10-CM

## 2020-06-24 MED ORDER — LENALIDOMIDE 10 MG PO CAPS: 10.0000 mg | ORAL_CAPSULE | Freq: Every day | ORAL | 0 refills | Status: DC

## 2020-06-24 NOTE — Telephone Encounter (Signed)
Received faxed refill request for Revlimid 10 mg from [facility] [employer]. Verbal order from Dr. Irene Limbo to refill. [employer] Auth # [id number], 06/24/20 Escribed to [facility] by Westley Gambles, [place] - [id number]

## 2020-06-25 ENCOUNTER — Other Ambulatory Visit: Payer: BC Managed Care – PPO

## 2020-06-25 ENCOUNTER — Inpatient Hospital Stay: Payer: BC Managed Care – PPO

## 2020-06-25 ENCOUNTER — Other Ambulatory Visit: Payer: Self-pay

## 2020-06-25 ENCOUNTER — Inpatient Hospital Stay: Payer: BC Managed Care – PPO | Attending: Hematology | Admitting: Hematology

## 2020-06-25 VITALS — BP 127/85 | HR 70 | Temp 97.9°F | Resp 18 | Ht 68.0 in | Wt 157.1 lb

## 2020-06-25 DIAGNOSIS — R682 Dry mouth, unspecified: Secondary | ICD-10-CM | POA: Diagnosis not present

## 2020-06-25 DIAGNOSIS — M818 Other osteoporosis without current pathological fracture: Secondary | ICD-10-CM

## 2020-06-25 DIAGNOSIS — Z79899 Other long term (current) drug therapy: Secondary | ICD-10-CM | POA: Diagnosis not present

## 2020-06-25 DIAGNOSIS — N400 Enlarged prostate without lower urinary tract symptoms: Secondary | ICD-10-CM | POA: Diagnosis not present

## 2020-06-25 DIAGNOSIS — R5383 Other fatigue: Secondary | ICD-10-CM | POA: Diagnosis not present

## 2020-06-25 DIAGNOSIS — R42 Dizziness and giddiness: Secondary | ICD-10-CM | POA: Insufficient documentation

## 2020-06-25 DIAGNOSIS — I10 Essential (primary) hypertension: Secondary | ICD-10-CM | POA: Diagnosis not present

## 2020-06-25 DIAGNOSIS — N289 Disorder of kidney and ureter, unspecified: Secondary | ICD-10-CM | POA: Diagnosis not present

## 2020-06-25 DIAGNOSIS — C9000 Multiple myeloma not having achieved remission: Secondary | ICD-10-CM | POA: Insufficient documentation

## 2020-06-25 DIAGNOSIS — Z7982 Long term (current) use of aspirin: Secondary | ICD-10-CM | POA: Diagnosis not present

## 2020-06-25 DIAGNOSIS — D649 Anemia, unspecified: Secondary | ICD-10-CM | POA: Insufficient documentation

## 2020-06-25 DIAGNOSIS — M79605 Pain in left leg: Secondary | ICD-10-CM | POA: Diagnosis not present

## 2020-06-25 DIAGNOSIS — Z7952 Long term (current) use of systemic steroids: Secondary | ICD-10-CM | POA: Diagnosis not present

## 2020-06-25 DIAGNOSIS — C9010 Plasma cell leukemia not having achieved remission: Secondary | ICD-10-CM | POA: Insufficient documentation

## 2020-06-25 DIAGNOSIS — R0602 Shortness of breath: Secondary | ICD-10-CM | POA: Diagnosis not present

## 2020-06-25 DIAGNOSIS — Z5111 Encounter for antineoplastic chemotherapy: Secondary | ICD-10-CM

## 2020-06-25 DIAGNOSIS — F17210 Nicotine dependence, cigarettes, uncomplicated: Secondary | ICD-10-CM | POA: Diagnosis not present

## 2020-06-25 DIAGNOSIS — Z7189 Other specified counseling: Secondary | ICD-10-CM

## 2020-06-25 DIAGNOSIS — R05 Cough: Secondary | ICD-10-CM | POA: Insufficient documentation

## 2020-06-25 LAB — CBC WITH DIFFERENTIAL/PLATELET
Abs Immature Granulocytes: 0.02 10*3/uL (ref 0.00–0.07)
Basophils Absolute: 0.1 10*3/uL (ref 0.0–0.1)
Basophils Relative: 1 %
Eosinophils Absolute: 0.5 10*3/uL (ref 0.0–0.5)
Eosinophils Relative: 9 %
HCT: 36.8 % — ABNORMAL LOW (ref 39.0–52.0)
Hemoglobin: 12.6 g/dL — ABNORMAL LOW (ref 13.0–17.0)
Immature Granulocytes: 0 %
Lymphocytes Relative: 33 %
Lymphs Abs: 1.9 10*3/uL (ref 0.7–4.0)
MCH: 32 pg (ref 26.0–34.0)
MCHC: 34.2 g/dL (ref 30.0–36.0)
MCV: 93.4 fL (ref 80.0–100.0)
Monocytes Absolute: 0.6 10*3/uL (ref 0.1–1.0)
Monocytes Relative: 11 %
Neutro Abs: 2.6 10*3/uL (ref 1.7–7.7)
Neutrophils Relative %: 46 %
Platelets: 193 10*3/uL (ref 150–400)
RBC: 3.94 MIL/uL — ABNORMAL LOW (ref 4.22–5.81)
RDW: 15.7 % — ABNORMAL HIGH (ref 11.5–15.5)
WBC: 5.8 10*3/uL (ref 4.0–10.5)
nRBC: 0 % (ref 0.0–0.2)

## 2020-06-25 LAB — CMP (CANCER CENTER ONLY)
ALT: 20 U/L (ref 0–44)
AST: 13 U/L — ABNORMAL LOW (ref 15–41)
Albumin: 4.1 g/dL (ref 3.5–5.0)
Alkaline Phosphatase: 71 U/L (ref 38–126)
Anion gap: 11 (ref 5–15)
BUN: 32 mg/dL — ABNORMAL HIGH (ref 8–23)
CO2: 17 mmol/L — ABNORMAL LOW (ref 22–32)
Calcium: 9.3 mg/dL (ref 8.9–10.3)
Chloride: 114 mmol/L — ABNORMAL HIGH (ref 98–111)
Creatinine: 1.86 mg/dL — ABNORMAL HIGH (ref 0.61–1.24)
GFR, Est AFR Am: 44 mL/min — ABNORMAL LOW (ref >60)
GFR, Estimated: 38 mL/min — ABNORMAL LOW (ref >60)
Glucose, Bld: 81 mg/dL (ref 70–99)
Potassium: 4.6 mmol/L (ref 3.5–5.1)
Sodium: 142 mmol/L (ref 135–145)
Total Bilirubin: 0.4 mg/dL (ref 0.3–1.2)
Total Protein: 7.6 g/dL (ref 6.5–8.1)

## 2020-06-25 MED ORDER — FAMOTIDINE 20 MG PO TABS
ORAL_TABLET | ORAL | Status: AC
  Filled 2020-06-25: qty 1

## 2020-06-25 MED ORDER — DEXTROSE 5 % IV SOLN
56.0000 mg/m2 | Freq: Once | INTRAVENOUS | Status: AC
  Administered 2020-06-25: 100 mg via INTRAVENOUS
  Filled 2020-06-25: qty 30

## 2020-06-25 MED ORDER — FAMOTIDINE 20 MG PO TABS
20.0000 mg | ORAL_TABLET | Freq: Once | ORAL | Status: AC
  Administered 2020-06-25: 20 mg via ORAL

## 2020-06-25 MED ORDER — SODIUM CHLORIDE 0.9 % IV SOLN
INTRAVENOUS | Status: AC
  Filled 2020-06-25: qty 250

## 2020-06-25 MED ORDER — SODIUM CHLORIDE 0.9 % IV SOLN
20.0000 mg | Freq: Once | INTRAVENOUS | Status: AC
  Administered 2020-06-25: 20 mg via INTRAVENOUS
  Filled 2020-06-25: qty 20

## 2020-06-25 MED ORDER — ACETAMINOPHEN 325 MG PO TABS
650.0000 mg | ORAL_TABLET | Freq: Once | ORAL | Status: AC
  Administered 2020-06-25: 650 mg via ORAL

## 2020-06-25 MED ORDER — ACETAMINOPHEN 325 MG PO TABS
ORAL_TABLET | ORAL | Status: AC
  Filled 2020-06-25: qty 2

## 2020-06-25 MED ORDER — SODIUM CHLORIDE 0.9 % IV SOLN
Freq: Once | INTRAVENOUS | Status: AC
  Filled 2020-06-25: qty 250

## 2020-06-25 MED ORDER — FAMOTIDINE IN NACL 20-0.9 MG/50ML-% IV SOLN
INTRAVENOUS | Status: AC
  Filled 2020-06-25: qty 50

## 2020-06-25 NOTE — Patient Instructions (Signed)
[facility] Discharge Instructions for Patients Receiving Chemotherapy  Today you received the following chemotherapy agents:  Kyprolis  To help prevent nausea and vomiting after your treatment, we encourage you to take your nausea medication as directed.   If you develop nausea and vomiting that is not controlled by your nausea medication, call the clinic.   BELOW ARE SYMPTOMS THAT SHOULD BE REPORTED IMMEDIATELY:  *FEVER GREATER THAN 100.5 F  *CHILLS WITH OR WITHOUT FEVER  NAUSEA AND VOMITING THAT IS NOT CONTROLLED WITH YOUR NAUSEA MEDICATION  *UNUSUAL SHORTNESS OF BREATH  *UNUSUAL BRUISING OR BLEEDING  TENDERNESS IN MOUTH AND THROAT WITH OR WITHOUT PRESENCE OF ULCERS  *URINARY PROBLEMS  *BOWEL PROBLEMS  UNUSUAL RASH Items with * indicate a potential emergency and should be followed up as soon as possible.  Feel free to call the clinic should you have any questions or concerns. The clinic phone number is [phone].  Please show the CHEMO ALERT CARD at check-in to the Emergency Department and triage nurse.   

## 2020-06-25 NOTE — Progress Notes (Signed)
Per Dr. Irene Limbo, [place] to treat with elevated creatine, as it is at baseline. IV fluid orders clarified and pt to receive 224ml over 30 minutes.

## 2020-06-28 ENCOUNTER — Telehealth: Payer: Self-pay | Admitting: Hematology

## 2020-06-28 LAB — KAPPA/LAMBDA LIGHT CHAINS
Kappa free light chain: 42.3 mg/L — ABNORMAL HIGH (ref 3.3–19.4)
Kappa, lambda light chain ratio: 1.61 (ref 0.26–1.65)
Lambda free light chains: 26.2 mg/L (ref 5.7–26.3)

## 2020-06-28 NOTE — Telephone Encounter (Signed)
Scheduled per 07/16 los, patient has been called and voicemail was left.

## 2020-07-01 MED ORDER — NICOTINE 14 MG/24HR TD PT24: MEDICATED_PATCH | TRANSDERMAL | 1 refills | Status: DC

## 2020-07-09 ENCOUNTER — Inpatient Hospital Stay: Payer: BC Managed Care – PPO

## 2020-07-09 ENCOUNTER — Other Ambulatory Visit: Payer: Self-pay

## 2020-07-09 VITALS — BP 112/72 | HR 79 | Temp 98.5°F | Resp 18

## 2020-07-09 DIAGNOSIS — C9010 Plasma cell leukemia not having achieved remission: Secondary | ICD-10-CM

## 2020-07-09 DIAGNOSIS — C9000 Multiple myeloma not having achieved remission: Secondary | ICD-10-CM | POA: Diagnosis not present

## 2020-07-09 DIAGNOSIS — Z7189 Other specified counseling: Secondary | ICD-10-CM

## 2020-07-09 LAB — CBC WITH DIFFERENTIAL/PLATELET
Abs Immature Granulocytes: 0.04 10*3/uL (ref 0.00–0.07)
Basophils Absolute: 0.1 10*3/uL (ref 0.0–0.1)
Basophils Relative: 1 %
Eosinophils Absolute: 0.2 10*3/uL (ref 0.0–0.5)
Eosinophils Relative: 3 %
HCT: 36.5 % — ABNORMAL LOW (ref 39.0–52.0)
Hemoglobin: 12.6 g/dL — ABNORMAL LOW (ref 13.0–17.0)
Immature Granulocytes: 1 %
Lymphocytes Relative: 25 %
Lymphs Abs: 1.9 10*3/uL (ref 0.7–4.0)
MCH: 32 pg (ref 26.0–34.0)
MCHC: 34.5 g/dL (ref 30.0–36.0)
MCV: 92.6 fL (ref 80.0–100.0)
Monocytes Absolute: 0.4 10*3/uL (ref 0.1–1.0)
Monocytes Relative: 6 %
Neutro Abs: 5 10*3/uL (ref 1.7–7.7)
Neutrophils Relative %: 64 %
Platelets: 228 10*3/uL (ref 150–400)
RBC: 3.94 MIL/uL — ABNORMAL LOW (ref 4.22–5.81)
RDW: 15.5 % (ref 11.5–15.5)
WBC: 7.8 10*3/uL (ref 4.0–10.5)
nRBC: 0 % (ref 0.0–0.2)

## 2020-07-09 LAB — CMP (CANCER CENTER ONLY)
ALT: 17 U/L (ref 0–44)
AST: 12 U/L — ABNORMAL LOW (ref 15–41)
Albumin: 4.2 g/dL (ref 3.5–5.0)
Alkaline Phosphatase: 73 U/L (ref 38–126)
Anion gap: 12 (ref 5–15)
BUN: 32 mg/dL — ABNORMAL HIGH (ref 8–23)
CO2: 19 mmol/L — ABNORMAL LOW (ref 22–32)
Calcium: 10.2 mg/dL (ref 8.9–10.3)
Chloride: 112 mmol/L — ABNORMAL HIGH (ref 98–111)
Creatinine: 2.07 mg/dL — ABNORMAL HIGH (ref 0.61–1.24)
GFR, Est AFR Am: 38 mL/min — ABNORMAL LOW (ref >60)
GFR, Estimated: 33 mL/min — ABNORMAL LOW (ref >60)
Glucose, Bld: 108 mg/dL — ABNORMAL HIGH (ref 70–99)
Potassium: 4.4 mmol/L (ref 3.5–5.1)
Sodium: 143 mmol/L (ref 135–145)
Total Bilirubin: 0.4 mg/dL (ref 0.3–1.2)
Total Protein: 7.4 g/dL (ref 6.5–8.1)

## 2020-07-09 MED ORDER — ACETAMINOPHEN 325 MG PO TABS
650.0000 mg | ORAL_TABLET | Freq: Once | ORAL | Status: AC
  Administered 2020-07-09: 650 mg via ORAL

## 2020-07-09 MED ORDER — SODIUM CHLORIDE 0.9 % IV SOLN
Freq: Once | INTRAVENOUS | Status: AC
  Filled 2020-07-09: qty 250

## 2020-07-09 MED ORDER — ACETAMINOPHEN 325 MG PO TABS
ORAL_TABLET | ORAL | Status: AC
  Filled 2020-07-09: qty 2

## 2020-07-09 MED ORDER — FAMOTIDINE 20 MG PO TABS
ORAL_TABLET | ORAL | Status: AC
  Filled 2020-07-09: qty 1

## 2020-07-09 MED ORDER — SODIUM CHLORIDE 0.9 % IV SOLN
20.0000 mg | Freq: Once | INTRAVENOUS | Status: AC
  Administered 2020-07-09: 20 mg via INTRAVENOUS
  Filled 2020-07-09: qty 20

## 2020-07-09 MED ORDER — DEXTROSE 5 % IV SOLN
56.0000 mg/m2 | Freq: Once | INTRAVENOUS | Status: AC
  Administered 2020-07-09: 100 mg via INTRAVENOUS
  Filled 2020-07-09: qty 15

## 2020-07-09 MED ORDER — SODIUM CHLORIDE 0.9 % IV SOLN
INTRAVENOUS | Status: AC
  Filled 2020-07-09: qty 250

## 2020-07-09 MED ORDER — FAMOTIDINE 20 MG PO TABS
20.0000 mg | ORAL_TABLET | Freq: Once | ORAL | Status: AC
  Administered 2020-07-09: 20 mg via ORAL

## 2020-07-09 NOTE — Progress Notes (Signed)
Patient signed into treatment. Patient does not have a port.

## 2020-07-09 NOTE — Progress Notes (Signed)
Per Dr. Lorenso Courier, okay to treat with Crt 2.07

## 2020-07-09 NOTE — Patient Instructions (Signed)
[facility] Discharge Instructions for Patients Receiving Chemotherapy  Today you received the following chemotherapy agents:  Kyprolis  To help prevent nausea and vomiting after your treatment, we encourage you to take your nausea medication as directed.   If you develop nausea and vomiting that is not controlled by your nausea medication, call the clinic.   BELOW ARE SYMPTOMS THAT SHOULD BE REPORTED IMMEDIATELY:  *FEVER GREATER THAN 100.5 F  *CHILLS WITH OR WITHOUT FEVER  NAUSEA AND VOMITING THAT IS NOT CONTROLLED WITH YOUR NAUSEA MEDICATION  *UNUSUAL SHORTNESS OF BREATH  *UNUSUAL BRUISING OR BLEEDING  TENDERNESS IN MOUTH AND THROAT WITH OR WITHOUT PRESENCE OF ULCERS  *URINARY PROBLEMS  *BOWEL PROBLEMS  UNUSUAL RASH Items with * indicate a potential emergency and should be followed up as soon as possible.  Feel free to call the clinic should you have any questions or concerns. The clinic phone number is [phone].  Please show the CHEMO ALERT CARD at check-in to the Emergency Department and triage nurse.   

## 2020-07-22 ENCOUNTER — Other Ambulatory Visit: Payer: Self-pay | Admitting: *Deleted

## 2020-07-22 DIAGNOSIS — C9010 Plasma cell leukemia not having achieved remission: Secondary | ICD-10-CM

## 2020-07-22 MED ORDER — LENALIDOMIDE 10 MG PO CAPS: 10.0000 mg | ORAL_CAPSULE | Freq: Every day | ORAL | 0 refills | Status: DC

## 2020-07-22 NOTE — Telephone Encounter (Signed)
Received faxed refill request for Revlimid 10 mg from [facility] [employer] Refill escribed to [facility] by Westley Gambles, [place] - [address] [employer] Auth# [id number], 07/22/20

## 2020-07-23 ENCOUNTER — Inpatient Hospital Stay: Payer: BC Managed Care – PPO

## 2020-07-23 ENCOUNTER — Inpatient Hospital Stay: Payer: BC Managed Care – PPO | Attending: Hematology

## 2020-07-23 ENCOUNTER — Other Ambulatory Visit: Payer: Self-pay

## 2020-07-23 VITALS — BP 119/77 | HR 69 | Temp 98.4°F | Resp 18

## 2020-07-23 DIAGNOSIS — Z5111 Encounter for antineoplastic chemotherapy: Secondary | ICD-10-CM | POA: Diagnosis not present

## 2020-07-23 DIAGNOSIS — N289 Disorder of kidney and ureter, unspecified: Secondary | ICD-10-CM | POA: Insufficient documentation

## 2020-07-23 DIAGNOSIS — C9010 Plasma cell leukemia not having achieved remission: Secondary | ICD-10-CM

## 2020-07-23 DIAGNOSIS — Z7189 Other specified counseling: Secondary | ICD-10-CM

## 2020-07-23 DIAGNOSIS — Z79899 Other long term (current) drug therapy: Secondary | ICD-10-CM | POA: Diagnosis not present

## 2020-07-23 DIAGNOSIS — F17210 Nicotine dependence, cigarettes, uncomplicated: Secondary | ICD-10-CM | POA: Diagnosis not present

## 2020-07-23 DIAGNOSIS — F1021 Alcohol dependence, in remission: Secondary | ICD-10-CM | POA: Insufficient documentation

## 2020-07-23 DIAGNOSIS — Z7982 Long term (current) use of aspirin: Secondary | ICD-10-CM | POA: Insufficient documentation

## 2020-07-23 DIAGNOSIS — C9000 Multiple myeloma not having achieved remission: Secondary | ICD-10-CM | POA: Insufficient documentation

## 2020-07-23 LAB — CBC WITH DIFFERENTIAL/PLATELET
Abs Immature Granulocytes: 0.02 10*3/uL (ref 0.00–0.07)
Basophils Absolute: 0.1 10*3/uL (ref 0.0–0.1)
Basophils Relative: 1 %
Eosinophils Absolute: 0.4 10*3/uL (ref 0.0–0.5)
Eosinophils Relative: 7 %
HCT: 35.1 % — ABNORMAL LOW (ref 39.0–52.0)
Hemoglobin: 12 g/dL — ABNORMAL LOW (ref 13.0–17.0)
Immature Granulocytes: 0 %
Lymphocytes Relative: 28 %
Lymphs Abs: 1.6 10*3/uL (ref 0.7–4.0)
MCH: 31.8 pg (ref 26.0–34.0)
MCHC: 34.2 g/dL (ref 30.0–36.0)
MCV: 93.1 fL (ref 80.0–100.0)
Monocytes Absolute: 0.7 10*3/uL (ref 0.1–1.0)
Monocytes Relative: 12 %
Neutro Abs: 3 10*3/uL (ref 1.7–7.7)
Neutrophils Relative %: 52 %
Platelets: 198 10*3/uL (ref 150–400)
RBC: 3.77 MIL/uL — ABNORMAL LOW (ref 4.22–5.81)
RDW: 15.8 % — ABNORMAL HIGH (ref 11.5–15.5)
WBC: 5.7 10*3/uL (ref 4.0–10.5)
nRBC: 0 % (ref 0.0–0.2)

## 2020-07-23 LAB — CMP (CANCER CENTER ONLY)
ALT: 14 U/L (ref 0–44)
AST: 12 U/L — ABNORMAL LOW (ref 15–41)
Albumin: 3.9 g/dL (ref 3.5–5.0)
Alkaline Phosphatase: 69 U/L (ref 38–126)
Anion gap: 11 (ref 5–15)
BUN: 32 mg/dL — ABNORMAL HIGH (ref 8–23)
CO2: 19 mmol/L — ABNORMAL LOW (ref 22–32)
Calcium: 9.3 mg/dL (ref 8.9–10.3)
Chloride: 112 mmol/L — ABNORMAL HIGH (ref 98–111)
Creatinine: 2.09 mg/dL — ABNORMAL HIGH (ref 0.61–1.24)
GFR, Est AFR Am: 38 mL/min — ABNORMAL LOW (ref >60)
GFR, Estimated: 33 mL/min — ABNORMAL LOW (ref >60)
Glucose, Bld: 101 mg/dL — ABNORMAL HIGH (ref 70–99)
Potassium: 4.1 mmol/L (ref 3.5–5.1)
Sodium: 142 mmol/L (ref 135–145)
Total Bilirubin: 0.4 mg/dL (ref 0.3–1.2)
Total Protein: 7 g/dL (ref 6.5–8.1)

## 2020-07-23 MED ORDER — SODIUM CHLORIDE 0.9 % IV SOLN
20.0000 mg | Freq: Once | INTRAVENOUS | Status: AC
  Administered 2020-07-23: 20 mg via INTRAVENOUS
  Filled 2020-07-23: qty 20

## 2020-07-23 MED ORDER — FAMOTIDINE 20 MG PO TABS
ORAL_TABLET | ORAL | Status: AC
  Filled 2020-07-23: qty 1

## 2020-07-23 MED ORDER — DEXTROSE 5 % IV SOLN
56.0000 mg/m2 | Freq: Once | INTRAVENOUS | Status: AC
  Administered 2020-07-23: 100 mg via INTRAVENOUS
  Filled 2020-07-23: qty 5

## 2020-07-23 MED ORDER — SODIUM CHLORIDE 0.9 % IV SOLN
Freq: Once | INTRAVENOUS | Status: AC
  Filled 2020-07-23: qty 250

## 2020-07-23 MED ORDER — FAMOTIDINE IN NACL 20-0.9 MG/50ML-% IV SOLN
INTRAVENOUS | Status: AC
  Filled 2020-07-23: qty 50

## 2020-07-23 MED ORDER — ACETAMINOPHEN 325 MG PO TABS
650.0000 mg | ORAL_TABLET | Freq: Once | ORAL | Status: AC
  Administered 2020-07-23: 650 mg via ORAL

## 2020-07-23 MED ORDER — ACETAMINOPHEN 325 MG PO TABS
ORAL_TABLET | ORAL | Status: AC
  Filled 2020-07-23: qty 2

## 2020-07-23 MED ORDER — FAMOTIDINE 20 MG PO TABS
20.0000 mg | ORAL_TABLET | Freq: Once | ORAL | Status: AC
  Administered 2020-07-23: 20 mg via ORAL

## 2020-07-23 MED ORDER — SODIUM CHLORIDE 0.9 % IV SOLN
INTRAVENOUS | Status: AC
  Filled 2020-07-23: qty 250

## 2020-07-23 NOTE — Progress Notes (Signed)
Scr. 2.09 today. Okay to treat per Dr. Alen Blew.

## 2020-07-23 NOTE — Progress Notes (Signed)
Labs drawn peripherally. No port

## 2020-07-23 NOTE — Patient Instructions (Signed)
[facility] Discharge Instructions for Patients Receiving Chemotherapy  Today you received the following chemotherapy agents:  Kyprolis  To help prevent nausea and vomiting after your treatment, we encourage you to take your nausea medication as directed.   If you develop nausea and vomiting that is not controlled by your nausea medication, call the clinic.   BELOW ARE SYMPTOMS THAT SHOULD BE REPORTED IMMEDIATELY:  *FEVER GREATER THAN 100.5 F  *CHILLS WITH OR WITHOUT FEVER  NAUSEA AND VOMITING THAT IS NOT CONTROLLED WITH YOUR NAUSEA MEDICATION  *UNUSUAL SHORTNESS OF BREATH  *UNUSUAL BRUISING OR BLEEDING  TENDERNESS IN MOUTH AND THROAT WITH OR WITHOUT PRESENCE OF ULCERS  *URINARY PROBLEMS  *BOWEL PROBLEMS  UNUSUAL RASH Items with * indicate a potential emergency and should be followed up as soon as possible.  Feel free to call the clinic should you have any questions or concerns. The clinic phone number is [phone].  Please show the CHEMO ALERT CARD at check-in to the Emergency Department and triage nurse.   

## 2020-08-06 ENCOUNTER — Inpatient Hospital Stay: Payer: BC Managed Care – PPO

## 2020-08-06 ENCOUNTER — Other Ambulatory Visit: Payer: BC Managed Care – PPO

## 2020-08-06 ENCOUNTER — Inpatient Hospital Stay (HOSPITAL_BASED_OUTPATIENT_CLINIC_OR_DEPARTMENT_OTHER): Payer: BC Managed Care – PPO | Admitting: Hematology

## 2020-08-06 ENCOUNTER — Other Ambulatory Visit: Payer: Self-pay

## 2020-08-06 VITALS — BP 110/86 | HR 84 | Temp 98.5°F | Resp 18 | Ht 68.0 in | Wt 155.5 lb

## 2020-08-06 DIAGNOSIS — Z5111 Encounter for antineoplastic chemotherapy: Secondary | ICD-10-CM | POA: Diagnosis not present

## 2020-08-06 DIAGNOSIS — F1021 Alcohol dependence, in remission: Secondary | ICD-10-CM | POA: Diagnosis not present

## 2020-08-06 DIAGNOSIS — F17210 Nicotine dependence, cigarettes, uncomplicated: Secondary | ICD-10-CM | POA: Diagnosis not present

## 2020-08-06 DIAGNOSIS — Z9484 Stem cells transplant status: Secondary | ICD-10-CM

## 2020-08-06 DIAGNOSIS — Z7189 Other specified counseling: Secondary | ICD-10-CM

## 2020-08-06 DIAGNOSIS — N289 Disorder of kidney and ureter, unspecified: Secondary | ICD-10-CM | POA: Diagnosis not present

## 2020-08-06 DIAGNOSIS — C9010 Plasma cell leukemia not having achieved remission: Secondary | ICD-10-CM | POA: Diagnosis not present

## 2020-08-06 DIAGNOSIS — J205 Acute bronchitis due to respiratory syncytial virus: Secondary | ICD-10-CM | POA: Diagnosis not present

## 2020-08-06 DIAGNOSIS — Z7982 Long term (current) use of aspirin: Secondary | ICD-10-CM | POA: Diagnosis not present

## 2020-08-06 DIAGNOSIS — Z79899 Other long term (current) drug therapy: Secondary | ICD-10-CM | POA: Diagnosis not present

## 2020-08-06 DIAGNOSIS — C9000 Multiple myeloma not having achieved remission: Secondary | ICD-10-CM | POA: Diagnosis not present

## 2020-08-06 LAB — CMP (CANCER CENTER ONLY)
ALT: 13 U/L (ref 0–44)
AST: 12 U/L — ABNORMAL LOW (ref 15–41)
Albumin: 3.8 g/dL (ref 3.5–5.0)
Alkaline Phosphatase: 69 U/L (ref 38–126)
Anion gap: 10 (ref 5–15)
BUN: 26 mg/dL — ABNORMAL HIGH (ref 8–23)
CO2: 18 mmol/L — ABNORMAL LOW (ref 22–32)
Calcium: 9.2 mg/dL (ref 8.9–10.3)
Chloride: 111 mmol/L (ref 98–111)
Creatinine: 1.92 mg/dL — ABNORMAL HIGH (ref 0.61–1.24)
GFR, Est AFR Am: 42 mL/min — ABNORMAL LOW (ref >60)
GFR, Estimated: 36 mL/min — ABNORMAL LOW (ref >60)
Glucose, Bld: 120 mg/dL — ABNORMAL HIGH (ref 70–99)
Potassium: 4.1 mmol/L (ref 3.5–5.1)
Sodium: 139 mmol/L (ref 135–145)
Total Bilirubin: 0.4 mg/dL (ref 0.3–1.2)
Total Protein: 7 g/dL (ref 6.5–8.1)

## 2020-08-06 LAB — CBC WITH DIFFERENTIAL/PLATELET
Abs Immature Granulocytes: 0.03 10*3/uL (ref 0.00–0.07)
Basophils Absolute: 0.1 10*3/uL (ref 0.0–0.1)
Basophils Relative: 1 %
Eosinophils Absolute: 0.2 10*3/uL (ref 0.0–0.5)
Eosinophils Relative: 3 %
HCT: 35.1 % — ABNORMAL LOW (ref 39.0–52.0)
Hemoglobin: 11.9 g/dL — ABNORMAL LOW (ref 13.0–17.0)
Immature Granulocytes: 0 %
Lymphocytes Relative: 19 %
Lymphs Abs: 1.4 10*3/uL (ref 0.7–4.0)
MCH: 31.6 pg (ref 26.0–34.0)
MCHC: 33.9 g/dL (ref 30.0–36.0)
MCV: 93.1 fL (ref 80.0–100.0)
Monocytes Absolute: 0.4 10*3/uL (ref 0.1–1.0)
Monocytes Relative: 6 %
Neutro Abs: 5.1 10*3/uL (ref 1.7–7.7)
Neutrophils Relative %: 71 %
Platelets: 187 10*3/uL (ref 150–400)
RBC: 3.77 MIL/uL — ABNORMAL LOW (ref 4.22–5.81)
RDW: 16.2 % — ABNORMAL HIGH (ref 11.5–15.5)
WBC: 7.2 10*3/uL (ref 4.0–10.5)
nRBC: 0 % (ref 0.0–0.2)

## 2020-08-06 MED ORDER — ACETAMINOPHEN 325 MG PO TABS
ORAL_TABLET | ORAL | Status: AC
  Filled 2020-08-06: qty 2

## 2020-08-06 MED ORDER — ACETAMINOPHEN 325 MG PO TABS
650.0000 mg | ORAL_TABLET | Freq: Once | ORAL | Status: AC
  Administered 2020-08-06: 650 mg via ORAL

## 2020-08-06 MED ORDER — DEXTROSE 5 % IV SOLN
56.0000 mg/m2 | Freq: Once | INTRAVENOUS | Status: AC
  Administered 2020-08-06: 100 mg via INTRAVENOUS
  Filled 2020-08-06: qty 5

## 2020-08-06 MED ORDER — SODIUM CHLORIDE 0.9 % IV SOLN
Freq: Once | INTRAVENOUS | Status: AC
  Filled 2020-08-06: qty 250

## 2020-08-06 MED ORDER — FAMOTIDINE 20 MG PO TABS
ORAL_TABLET | ORAL | Status: AC
  Filled 2020-08-06: qty 2

## 2020-08-06 MED ORDER — SODIUM CHLORIDE 0.9 % IV SOLN
20.0000 mg | Freq: Once | INTRAVENOUS | Status: AC
  Administered 2020-08-06: 20 mg via INTRAVENOUS
  Filled 2020-08-06: qty 20

## 2020-08-06 MED ORDER — FAMOTIDINE 20 MG PO TABS
20.0000 mg | ORAL_TABLET | Freq: Once | ORAL | Status: AC
  Administered 2020-08-06: 20 mg via ORAL

## 2020-08-06 MED ORDER — SODIUM CHLORIDE 0.9 % IV SOLN
INTRAVENOUS | Status: AC
  Filled 2020-08-06: qty 250

## 2020-08-06 NOTE — Patient Instructions (Signed)
[facility] Discharge Instructions for Patients Receiving Chemotherapy  Today you received the following chemotherapy agents:  Kyprolis  To help prevent nausea and vomiting after your treatment, we encourage you to take your nausea medication as directed.   If you develop nausea and vomiting that is not controlled by your nausea medication, call the clinic.   BELOW ARE SYMPTOMS THAT SHOULD BE REPORTED IMMEDIATELY:  *FEVER GREATER THAN 100.5 F  *CHILLS WITH OR WITHOUT FEVER  NAUSEA AND VOMITING THAT IS NOT CONTROLLED WITH YOUR NAUSEA MEDICATION  *UNUSUAL SHORTNESS OF BREATH  *UNUSUAL BRUISING OR BLEEDING  TENDERNESS IN MOUTH AND THROAT WITH OR WITHOUT PRESENCE OF ULCERS  *URINARY PROBLEMS  *BOWEL PROBLEMS  UNUSUAL RASH Items with * indicate a potential emergency and should be followed up as soon as possible.  Feel free to call the clinic should you have any questions or concerns. The clinic phone number is [phone].  Please show the CHEMO ALERT CARD at check-in to the Emergency Department and triage nurse.   

## 2020-08-06 NOTE — Progress Notes (Signed)
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 His labs today 12/14/2017 show hgb 8.3, platelets 524k. He is doing well overall. He has staples in his right upper leg following a recent surgery and has not been in touch with the surgeons for a f/u of this. He is not receiving physical therapy and is not getting around at home.  He notes that he was not scheduled for a post hospitalization nephrology f/u and has no PCP.  On review of systems, pt reports intermittent fatigue, SOB, pain to the left leg, dizziness, dry mouth and denies fever, chills, night sweats, dysuria and any other accompanying symptoms.   INTERVAL HISTORY:   Andres Fernandez presents today for follow up and continue management of his myeloma/plasma cell leukemia. The patient's last visit with [name] was on 06/25/2020. The pt reports that he is doing well overall.  The pt reports that he has had a sharp pain in the left side of his chest twice that is not triggered by anything specific. This pain lasts for about 30 seconds. Pt notes that he was lifting heavy boxes on Monday and first experienced this pain on Wednesday.   He continues taking Acyclovir and Revlimid and denies any issues with either. Pt is experiencing finger cramps, that have improved lately. Pt has been eating more bananas, hydrating well, and keeping up with his electrolytes. He has been sleeping well and feeling energetic.   Lab results today (08/06/20) of CBC w/diff and CMP is as follows: all values are WNL except for RBC at 3.77, Hgb at 11.9, HCT at 35.1, RDW at 16.2, CO2 at 18, Glucose at 120, BUN at 26, Creatinine at 1.92, AST at 12, GFR Est Afr Am at 42. 08/06/2020 MMP is in progress 08/06/2020 K/L light chains is  in progress  On review of systems, pt reports finger cramps and denies SOB, chest pain, foot cramps, sleeplessness, fatigue, back pain, cough and any other symptoms.   REVIEW OF SYSTEMS:   A 10+ POINT REVIEW OF SYSTEMS WAS OBTAINED including neurology, dermatology, psychiatry, cardiac, respiratory, lymph, extremities, GI, GU, Musculoskeletal, constitutional, breasts, reproductive, HEENT.  All pertinent positives are noted in the HPI.  All others are negative.   MEDICAL HISTORY:  Past Medical History:  Diagnosis Date  . BPH (benign prostatic hyperplasia)   . Hepatitis C 11/27/2017  . Hypertension   . Plasma cell leukemia ([place]) 11/23/2017  . Tobacco abuse     SURGICAL HISTORY: Past Surgical History:  Procedure Laterality Date  . APPENDECTOMY    . FEMUR IM NAIL Right 11/20/2017   Procedure: RIGHT FEMORAL INTRAMEDULLARY (IM) NAIL;  Surgeon: Nicholes Stairs, MD;  Location: [facility];  Service: Orthopedics;  Laterality: Right;    SOCIAL HISTORY: Social History   Socioeconomic History  . Marital status: Single    Spouse name: Not on file  . Number of children: Not on file  . Years of education: Not on file  . Highest education level: Not on file  Occupational History  . Not on file  Tobacco Use  . Smoking status: Current Every Day Smoker    Packs/day: 0.25    Types: Cigarettes    Last attempt to quit: 11/09/2017    Years since quitting: 2.7  .  Smokeless tobacco: Never Used  . Tobacco comment: 1 cigarette/day as of 09/25/18  Vaping Use  . Vaping Use: Never used  Substance and Sexual Activity  . Alcohol use: Yes  . Drug use: No  . Sexual activity: Not on file  Other Topics Concern  . Not on file  Social History Narrative  . Not on file   Social Determinants of Health   Financial Resource Strain:   . Difficulty of Paying Living Expenses: Not on file  Food Insecurity:   . Worried About Charity fundraiser in the Last Year: Not on file  . Ran Out of Food in the Last  Year: Not on file  Transportation Needs:   . Lack of Transportation (Medical): Not on file  . Lack of Transportation (Non-Medical): Not on file  Physical Activity:   . Days of Exercise per Week: Not on file  . Minutes of Exercise per Session: Not on file  Stress:   . Feeling of Stress : Not on file  Social Connections:   . Frequency of Communication with Friends and Family: Not on file  . Frequency of Social Gatherings with Friends and Family: Not on file  . Attends Religious Services: Not on file  . Active Member of Clubs or Organizations: Not on file  . Attends Archivist Meetings: Not on file  . Marital Status: Not on file  Intimate Partner Violence:   . Fear of Current or Ex-Partner: Not on file  . Emotionally Abused: Not on file  . Physically Abused: Not on file  . Sexually Abused: Not on file    FAMILY HISTORY: Family History  Problem Relation Age of Onset  . COPD Mother   . Liver cancer Mother     ALLERGIES:  has No Known Allergies.  MEDICATIONS:  . Current Outpatient Medications on File Prior to Visit  Medication Sig Dispense Refill  . acyclovir (ZOVIRAX) 400 MG tablet Take 1 tablet (400 mg total) by mouth daily. 30 tablet 11  . aspirin EC 81 MG tablet Take 1 tablet (81 mg total) by mouth daily. 60 tablet 11  . cholecalciferol (VITAMIN D) 400 units TABS tablet Take 400 Units by mouth daily.    [name] dexamethasone (DECADRON) 4 MG tablet Take 5 tablets (20 mg total) by mouth once a week. 20 tablet 1  . lenalidomide (REVLIMID) 10 MG capsule Take 1 capsule (10 mg total) by mouth daily. Take for 21 days on, 7 days off, repeat every 28 days. 21 capsule 0  . Multiple Vitamins-Minerals (MULTIVITAMIN WITH MINERALS) tablet Take 1 tablet by mouth daily.    . nicotine (NICODERM CQ - DOSED IN MG/24 HOURS) 14 mg/24hr patch PLACE 1 PATCH (14 MG TOTAL) ONTO THE SKIN DAILY. 28 patch 1  . ondansetron (ZOFRAN) 4 MG tablet Take 2 tablets (8 mg total) by mouth every 8 (eight)  hours as needed for nausea. 30 tablet 0  . tiZANidine (ZANAFLEX) 2 MG tablet Take 1 tablet (2 mg total) by mouth every 8 (eight) hours as needed for muscle spasms. 30 tablet 0  . Vitamin D, Ergocalciferol, (DRISDOL) 1.25 MG (50000 UNIT) CAPS capsule Take 1 capsule (50,000 Units total) by mouth once a week. 12 capsule 6   No current facility-administered medications on file prior to visit.    PHYSICAL EXAMINATION:  ECOG FS:1 - Symptomatic but completely ambulatory  Vitals:   08/06/20 1041  BP: 110/86  Pulse: 84  Resp: 18  Temp: 98.5 F (36.9 C)  SpO2: 100%   Wt Readings from Last 3 Encounters:  08/06/20 155 lb 8 oz (70.5 kg)  06/25/20 157 lb 1.6 oz (71.3 kg)  06/08/20 157 lb 12 oz (71.6 kg)   Body mass index is 23.64 kg/m.    GENERAL:alert, in no acute distress and comfortable SKIN: no acute rashes, no significant lesions EYES: conjunctiva are pink and non-injected, sclera anicteric OROPHARYNX: MMM, no exudates, no oropharyngeal erythema or ulceration NECK: supple, no JVD LYMPH:  no palpable lymphadenopathy in the cervical, axillary or inguinal regions LUNGS: clear to auscultation b/l with normal respiratory effort HEART: regular rate & rhythm ABDOMEN:  normoactive bowel sounds , non tender, not distended. No palpable hepatosplenomegaly.  Extremity: no pedal edema PSYCH: alert & oriented x 3 with fluent speech NEURO: no focal motor/sensory deficits  LABORATORY DATA:  I have reviewed the data as listed  . CBC Latest Ref Rng & Units 08/06/2020 07/23/2020 07/09/2020  WBC 4.0 - 10.5 K/uL 7.2 5.7 7.8  Hemoglobin 13.0 - 17.0 g/dL 11.9(L) 12.0(L) 12.6(L)  Hematocrit 39 - 52 % 35.1(L) 35.1(L) 36.5(L)  Platelets 150 - 400 K/uL 187 198 228   . CBC    Component Value Date/Time   WBC 7.2 08/06/2020 1009   RBC 3.77 (L) 08/06/2020 1009   HGB 11.9 (L) 08/06/2020 1009   HGB 11.9 (L) 08/27/2019 0922   HGB 8.3 (L) 12/14/2017 1230   HCT 35.1 (L) 08/06/2020 1009   HCT 23.9 (L)  12/14/2017 1230   PLT 187 08/06/2020 1009   PLT 203 08/27/2019 0922   PLT 524 (H) 12/14/2017 1230   MCV 93.1 08/06/2020 1009   MCV 88.4 12/14/2017 1230   MCH 31.6 08/06/2020 1009   MCHC 33.9 08/06/2020 1009   RDW 16.2 (H) 08/06/2020 1009   RDW 15.3 (H) 12/14/2017 1230   LYMPHSABS 1.4 08/06/2020 1009   LYMPHSABS 1.4 12/14/2017 1230   MONOABS 0.4 08/06/2020 1009   MONOABS 0.6 12/14/2017 1230   EOSABS 0.2 08/06/2020 1009   EOSABS 0.1 12/14/2017 1230   BASOSABS 0.1 08/06/2020 1009   BASOSABS 0.1 12/14/2017 1230     . CMP Latest Ref Rng & Units 08/06/2020 07/23/2020 07/09/2020  Glucose 70 - 99 mg/dL 120(H) 101(H) 108(H)  BUN 8 - 23 mg/dL 26(H) 32(H) 32(H)  Creatinine 0.61 - 1.24 mg/dL 1.92(H) 2.09(H) 2.07(H)  Sodium 135 - 145 mmol/L 139 142 143  Potassium 3.5 - 5.1 mmol/L 4.1 4.1 4.4  Chloride 98 - 111 mmol/L 111 112(H) 112(H)  CO2 22 - 32 mmol/L 18(L) 19(L) 19(L)  Calcium 8.9 - 10.3 mg/dL 9.2 9.3 10.2  Total Protein 6.5 - 8.1 g/dL 7.0 7.0 7.4  Total Bilirubin 0.3 - 1.2 mg/dL 0.4 0.4 0.4  Alkaline Phos 38 - 126 U/L 69 69 73  AST 15 - 41 U/L 12(L) 12(L) 12(L)  ALT 0 - 44 U/L 13 14 17    03/09/20 ([id number]) [facility] at [facility]   07/02/2019 Bone Marrow Biopsy ([id number]) at [facility]    08/07/18 BM Bx:       RADIOGRAPHIC STUDIES: I have personally reviewed the radiological images as listed and agreed with the findings in the report. No results found.  ASSESSMENT & PLAN:   64 y.o. 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   Initial BM Bx showed >95% involvement with kappa restricted serum free light chains. > 20% plasma cell in the peripheral blood concerning for plasma cell  leukemia. MoL Cy-   08/07/18 BM Bx revealed normocellular bone marrow with trilineage hematopoiesis and 1% plasma cells which indicates the pt is in remission 08/06/18 PET/CT revealed  Expansile lytic lesion involving the left iliac bone, unchanged. Lytic lesion involving the sternum, unchanged. While both are suspicious for myeloma, neither lesion is FDG avid. No hypermetabolic osseous lesions in the visualized axial and appendicular skeleton. Please note that the bilateral lower extremities were not imaged. No suspicious lymphadenopathy. Spleen is normal in size.    Pt began Ninlaro 25m maintenance after pt finished 13 cycles of CyBorD and BM bx showed only 1% plasma cells, then resumed CyBorD and held Ninlaro in anticipation of his tandem bone marrow transplants [facility] in late December 2019 [facility]  11/19/18 High dose chemotherapy with Melphalan 1466mm2 and autologous stem cell rescue  03/20/19 High dose chemotherapy with Melphalan 20090m2 and autologous stem cell rescue. Non-infectious diarrhea, uncomplicated course otherwise.  07/02/2019 PET scan with results revealing "1. No FDG avid bone lesions are identified. 2.  Asymmetric uptake within the right, greater than left, palatine tonsils with mild fullness of the right tonsillar soft tissue. ENT consult vs attention on follow up suggested. 3.  Innumerable mixed sclerotic and lytic lesions throughout the axial and appendicular skeleton without associated hypermetabolic activity. 4.  Ancillary CT findings as above."  07/02/2019 bone marrow biopsy ([id number])with results showing: "Trilineage hematopoieses with maturation. No plasma cell myeloma identified. Mild anemia and No Rouleaux or circulating plasma cells identified in the peripheral blood."  2) s/p Prophylactic IM nailing for rt femur  [facility] reported [facility] and is currently progressively  back to full time work.  3) Thrombocytopenia resolved   4) Renal insufficiency -- likely primarily related to Myeloma. stable Plan -maintain follow up with nephrology referral to Dr ColMarval RegalaNarda Amberdney for continued treatment -Pt completed 8 weeks of maverick for hep c with Dr ComLinus Salmons 07/08/18.  -Kidney numbers improved. Creatinine in the 1.7-2.2 range   5) h/o tobacco abuse-counseled on smoking cessation -Currently not smoking.  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 -Hgb improved to 13.4   -monitor  PLAN: -Discussed pt labwork today, 08/06/20; all values are WNL except for RBC at 3.77, Hgb at 11.9, HCT at 35.1, RDW at 16.2, CO2 at 18, Glucose at 120, BUN at 26, Creatinine at 1.92, AST at 12, GFR Est Afr Am at 42. -Discussed 08/06/2020 MMP & K/L light chains are in progress -The pt has no prohibitive toxicities from continuing C11D1 of maintainence Carfilzomib at this time. -continue current dose of Revlimid -Discussed the CDC guidelines regarding the COVID19 vaccine booster. Plan for pt to receive in clinic.  -Discussed proper lifting habits. Recommend pt avoid lifting heavy objects.  -Continue Pamidronate q8weeks -Will see back in 4 weeks with labs    FOLLOW UP: Covid booster today Plz schedule next 6 doses of Carfilzomib with portflush and labs MD visit in 4 weeks   The total time spent in the appt was 30 minutes and more than 50% was on counseling and direct patient cares, ordering and mx of treatment.  All of the patient's questions were answered  with apparent satisfaction. The patient knows to call the clinic with any problems, questions or concerns.   Sullivan Lone MD [place] AAHIVMS [facility] Hematology/Oncology Physician [facility]  (Office):       [phone] (Work cell):  [phone] (Fax):           [phone]  I, Yevette Edwards, am acting as a scribe  for Dr. Sullivan Lone.   .I have reviewed the above documentation for accuracy and completeness, and I agree with the above. Brunetta Genera MD

## 2020-08-07 ENCOUNTER — Other Ambulatory Visit: Payer: Self-pay

## 2020-08-07 ENCOUNTER — Inpatient Hospital Stay (HOSPITAL_COMMUNITY)
Admission: EM | Admit: 2020-08-07 | Discharge: 2020-08-09 | DRG: 202 | Disposition: A | Discharge status: Home / Self Care | Payer: BC Managed Care – PPO | Attending: Family Medicine | Admitting: Family Medicine

## 2020-08-07 ENCOUNTER — Encounter (HOSPITAL_COMMUNITY): Payer: Self-pay

## 2020-08-07 ENCOUNTER — Emergency Department (HOSPITAL_COMMUNITY): Payer: BC Managed Care – PPO

## 2020-08-07 DIAGNOSIS — E872 Acidosis: Secondary | ICD-10-CM | POA: Diagnosis present

## 2020-08-07 DIAGNOSIS — N1831 Chronic kidney disease, stage 3a: Secondary | ICD-10-CM | POA: Diagnosis present

## 2020-08-07 DIAGNOSIS — Y92239 Unspecified place in hospital as the place of occurrence of the external cause: Secondary | ICD-10-CM | POA: Diagnosis not present

## 2020-08-07 DIAGNOSIS — Z79899 Other long term (current) drug therapy: Secondary | ICD-10-CM | POA: Diagnosis not present

## 2020-08-07 DIAGNOSIS — J205 Acute bronchitis due to respiratory syncytial virus: Secondary | ICD-10-CM | POA: Diagnosis present

## 2020-08-07 DIAGNOSIS — J209 Acute bronchitis, unspecified: Secondary | ICD-10-CM

## 2020-08-07 DIAGNOSIS — Z7982 Long term (current) use of aspirin: Secondary | ICD-10-CM

## 2020-08-07 DIAGNOSIS — D696 Thrombocytopenia, unspecified: Secondary | ICD-10-CM | POA: Diagnosis present

## 2020-08-07 DIAGNOSIS — J440 Chronic obstructive pulmonary disease with acute lower respiratory infection: Secondary | ICD-10-CM

## 2020-08-07 DIAGNOSIS — J189 Pneumonia, unspecified organism: Secondary | ICD-10-CM

## 2020-08-07 DIAGNOSIS — Z825 Family history of asthma and other chronic lower respiratory diseases: Secondary | ICD-10-CM

## 2020-08-07 DIAGNOSIS — D630 Anemia in neoplastic disease: Secondary | ICD-10-CM | POA: Diagnosis present

## 2020-08-07 DIAGNOSIS — N289 Disorder of kidney and ureter, unspecified: Secondary | ICD-10-CM | POA: Diagnosis present

## 2020-08-07 DIAGNOSIS — E440 Moderate protein-calorie malnutrition: Secondary | ICD-10-CM

## 2020-08-07 DIAGNOSIS — Z20822 Contact with and (suspected) exposure to covid-19: Secondary | ICD-10-CM | POA: Diagnosis present

## 2020-08-07 DIAGNOSIS — B182 Chronic viral hepatitis C: Secondary | ICD-10-CM | POA: Diagnosis present

## 2020-08-07 DIAGNOSIS — F17210 Nicotine dependence, cigarettes, uncomplicated: Secondary | ICD-10-CM | POA: Diagnosis present

## 2020-08-07 DIAGNOSIS — R0602 Shortness of breath: Secondary | ICD-10-CM

## 2020-08-07 DIAGNOSIS — E875 Hyperkalemia: Secondary | ICD-10-CM | POA: Diagnosis present

## 2020-08-07 DIAGNOSIS — R0902 Hypoxemia: Secondary | ICD-10-CM | POA: Diagnosis present

## 2020-08-07 DIAGNOSIS — I129 Hypertensive chronic kidney disease with stage 1 through stage 4 chronic kidney disease, or unspecified chronic kidney disease: Secondary | ICD-10-CM | POA: Diagnosis present

## 2020-08-07 DIAGNOSIS — A419 Sepsis, unspecified organism: Secondary | ICD-10-CM | POA: Diagnosis not present

## 2020-08-07 DIAGNOSIS — R509 Fever, unspecified: Secondary | ICD-10-CM

## 2020-08-07 DIAGNOSIS — Z8616 Personal history of COVID-19: Secondary | ICD-10-CM | POA: Diagnosis not present

## 2020-08-07 DIAGNOSIS — N400 Enlarged prostate without lower urinary tract symptoms: Secondary | ICD-10-CM | POA: Diagnosis present

## 2020-08-07 DIAGNOSIS — Z800 Family history of malignant neoplasm of digestive organs: Secondary | ICD-10-CM | POA: Diagnosis not present

## 2020-08-07 DIAGNOSIS — R0682 Tachypnea, not elsewhere classified: Secondary | ICD-10-CM

## 2020-08-07 DIAGNOSIS — T380X5A Adverse effect of glucocorticoids and synthetic analogues, initial encounter: Secondary | ICD-10-CM | POA: Diagnosis not present

## 2020-08-07 DIAGNOSIS — Z6823 Body mass index (BMI) 23.0-23.9, adult: Secondary | ICD-10-CM | POA: Diagnosis not present

## 2020-08-07 DIAGNOSIS — C9010 Plasma cell leukemia not having achieved remission: Secondary | ICD-10-CM | POA: Diagnosis present

## 2020-08-07 LAB — CBC WITH DIFFERENTIAL/PLATELET
Abs Immature Granulocytes: 0.08 10*3/uL — ABNORMAL HIGH (ref 0.00–0.07)
Basophils Absolute: 0 10*3/uL (ref 0.0–0.1)
Basophils Relative: 0 %
Eosinophils Absolute: 0 10*3/uL (ref 0.0–0.5)
Eosinophils Relative: 0 %
HCT: 36.4 % — ABNORMAL LOW (ref 39.0–52.0)
Hemoglobin: 12.3 g/dL — ABNORMAL LOW (ref 13.0–17.0)
Immature Granulocytes: 1 %
Lymphocytes Relative: 14 %
Lymphs Abs: 1.3 10*3/uL (ref 0.7–4.0)
MCH: 32.5 pg (ref 26.0–34.0)
MCHC: 33.8 g/dL (ref 30.0–36.0)
MCV: 96 fL (ref 80.0–100.0)
Monocytes Absolute: 0.5 10*3/uL (ref 0.1–1.0)
Monocytes Relative: 6 %
Neutro Abs: 7.1 10*3/uL (ref 1.7–7.7)
Neutrophils Relative %: 79 %
Platelets: 161 10*3/uL (ref 150–400)
RBC: 3.79 MIL/uL — ABNORMAL LOW (ref 4.22–5.81)
RDW: 16.3 % — ABNORMAL HIGH (ref 11.5–15.5)
WBC: 9.1 10*3/uL (ref 4.0–10.5)
nRBC: 0 % (ref 0.0–0.2)

## 2020-08-07 LAB — LACTIC ACID, PLASMA
Lactic Acid, Venous: 2.5 mmol/L (ref 0.5–1.9)
Lactic Acid, Venous: 2.2 mmol/L (ref 0.5–1.9)

## 2020-08-07 LAB — URINALYSIS, ROUTINE W REFLEX MICROSCOPIC
Bacteria, UA: NONE SEEN
Bilirubin Urine: NEGATIVE
Glucose, UA: NEGATIVE mg/dL
Ketones, ur: NEGATIVE mg/dL
Leukocytes,Ua: NEGATIVE
Nitrite: NEGATIVE
Protein, ur: 30 mg/dL — AB
Specific Gravity, Urine: 1.016 (ref 1.005–1.030)
pH: 5 (ref 5.0–8.0)

## 2020-08-07 LAB — COMPREHENSIVE METABOLIC PANEL
ALT: 20 U/L (ref 0–44)
AST: 31 U/L (ref 15–41)
Albumin: 4 g/dL (ref 3.5–5.0)
Alkaline Phosphatase: 62 U/L (ref 38–126)
Anion gap: 13 (ref 5–15)
BUN: 37 mg/dL — ABNORMAL HIGH (ref 8–23)
CO2: 22 mmol/L (ref 22–32)
Calcium: 8.7 mg/dL — ABNORMAL LOW (ref 8.9–10.3)
Chloride: 108 mmol/L (ref 98–111)
Creatinine, Ser: 2.31 mg/dL — ABNORMAL HIGH (ref 0.61–1.24)
GFR calc Af Amer: 34 mL/min — ABNORMAL LOW (ref >60)
GFR calc non Af Amer: 29 mL/min — ABNORMAL LOW (ref >60)
Glucose, Bld: 115 mg/dL — ABNORMAL HIGH (ref 70–99)
Potassium: 4.2 mmol/L (ref 3.5–5.1)
Sodium: 143 mmol/L (ref 135–145)
Total Bilirubin: 0.4 mg/dL (ref 0.3–1.2)
Total Protein: 7.6 g/dL (ref 6.5–8.1)

## 2020-08-07 LAB — RESPIRATORY PANEL BY PCR

## 2020-08-07 LAB — SARS CORONAVIRUS 2 BY RT PCR (HOSPITAL ORDER, PERFORMED IN ~~LOC~~ HOSPITAL LAB): SARS Coronavirus 2: NEGATIVE

## 2020-08-07 LAB — BRAIN NATRIURETIC PEPTIDE: B Natriuretic Peptide: 129.6 pg/mL — ABNORMAL HIGH (ref 0.0–100.0)

## 2020-08-07 LAB — D-DIMER, QUANTITATIVE: D-Dimer, Quant: 1.47 ug/mL-FEU — ABNORMAL HIGH (ref 0.00–0.50)

## 2020-08-07 LAB — HIV ANTIBODY (ROUTINE TESTING W REFLEX): HIV Screen 4th Generation wRfx: NONREACTIVE

## 2020-08-07 LAB — TROPONIN I (HIGH SENSITIVITY)
Troponin I (High Sensitivity): 5 ng/L (ref <18)
Troponin I (High Sensitivity): 6 ng/L (ref <18)

## 2020-08-07 MED ORDER — ALBUTEROL SULFATE HFA 108 (90 BASE) MCG/ACT IN AERS
10.0000 | INHALATION_SPRAY | Freq: Once | RESPIRATORY_TRACT | Status: AC
  Administered 2020-08-07: 10 via RESPIRATORY_TRACT
  Filled 2020-08-07: qty 6.7

## 2020-08-07 MED ORDER — AZITHROMYCIN 250 MG PO TABS
500.0000 mg | ORAL_TABLET | Freq: Every day | ORAL | Status: DC
  Administered 2020-08-07 – 2020-08-08 (×2): 500 mg via ORAL
  Filled 2020-08-07 (×2): qty 2

## 2020-08-07 MED ORDER — SODIUM CHLORIDE 0.9 % IV SOLN
2.0000 g | INTRAVENOUS | Status: DC
  Administered 2020-08-07: 2 g via INTRAVENOUS
  Filled 2020-08-07: qty 20

## 2020-08-07 MED ORDER — METRONIDAZOLE IN NACL 5-0.79 MG/ML-% IV SOLN
500.0000 mg | Freq: Once | INTRAVENOUS | Status: AC
  Administered 2020-08-07: 500 mg via INTRAVENOUS
  Filled 2020-08-07: qty 100

## 2020-08-07 MED ORDER — SODIUM CHLORIDE 0.9 % IV SOLN
2.0000 g | Freq: Once | INTRAVENOUS | Status: AC
  Administered 2020-08-07: 2 g via INTRAVENOUS
  Filled 2020-08-07: qty 2

## 2020-08-07 MED ORDER — ACETAMINOPHEN 325 MG PO TABS
650.0000 mg | ORAL_TABLET | Freq: Four times a day (QID) | ORAL | Status: DC | PRN
  Administered 2020-08-08 (×2): 650 mg via ORAL
  Filled 2020-08-07 (×2): qty 2

## 2020-08-07 MED ORDER — IPRATROPIUM-ALBUTEROL 0.5-2.5 (3) MG/3ML IN SOLN
3.0000 mL | RESPIRATORY_TRACT | Status: DC | PRN
  Filled 2020-08-07: qty 3

## 2020-08-07 MED ORDER — NICOTINE 14 MG/24HR TD PT24
14.0000 mg | MEDICATED_PATCH | Freq: Every day | TRANSDERMAL | Status: DC
  Administered 2020-08-07 – 2020-08-08 (×2): 14 mg via TRANSDERMAL
  Filled 2020-08-07 (×2): qty 1

## 2020-08-07 MED ORDER — LACTATED RINGERS IV BOLUS
1000.0000 mL | Freq: Once | INTRAVENOUS | Status: AC
  Administered 2020-08-07: 1000 mL via INTRAVENOUS

## 2020-08-07 MED ORDER — IPRATROPIUM BROMIDE HFA 17 MCG/ACT IN AERS
2.0000 | INHALATION_SPRAY | Freq: Once | RESPIRATORY_TRACT | Status: AC
  Administered 2020-08-07: 2 via RESPIRATORY_TRACT
  Filled 2020-08-07: qty 12.9

## 2020-08-07 MED ORDER — IPRATROPIUM-ALBUTEROL 0.5-2.5 (3) MG/3ML IN SOLN
3.0000 mL | Freq: Four times a day (QID) | RESPIRATORY_TRACT | Status: DC
  Administered 2020-08-07 (×2): 3 mL via RESPIRATORY_TRACT
  Filled 2020-08-07: qty 3

## 2020-08-07 MED ORDER — ACETAMINOPHEN 500 MG PO TABS
1000.0000 mg | ORAL_TABLET | Freq: Once | ORAL | Status: AC
  Administered 2020-08-07: 1000 mg via ORAL
  Filled 2020-08-07: qty 2

## 2020-08-07 MED ORDER — ACETAMINOPHEN 650 MG RE SUPP: 650.0000 mg | Freq: Four times a day (QID) | RECTAL | Status: DC | PRN

## 2020-08-07 MED ORDER — ENOXAPARIN SODIUM 40 MG/0.4ML ~~LOC~~ SOLN
40.0000 mg | SUBCUTANEOUS | Status: DC
  Administered 2020-08-07: 40 mg via SUBCUTANEOUS
  Filled 2020-08-07: qty 0.4

## 2020-08-07 MED ORDER — METHYLPREDNISOLONE SODIUM SUCC 40 MG IJ SOLR
40.0000 mg | Freq: Every day | INTRAMUSCULAR | Status: DC
  Administered 2020-08-07 – 2020-08-08 (×2): 40 mg via INTRAVENOUS
  Filled 2020-08-07 (×2): qty 1

## 2020-08-07 MED ORDER — VANCOMYCIN HCL 1500 MG/300ML IV SOLN
1500.0000 mg | Freq: Once | INTRAVENOUS | Status: DC
  Administered 2020-08-07: 1500 mg via INTRAVENOUS
  Filled 2020-08-07: qty 300

## 2020-08-07 MED ORDER — TIZANIDINE HCL 4 MG PO TABS: 2.0000 mg | ORAL_TABLET | Freq: Three times a day (TID) | ORAL | Status: DC | PRN

## 2020-08-07 MED ORDER — IPRATROPIUM-ALBUTEROL 0.5-2.5 (3) MG/3ML IN SOLN
3.0000 mL | Freq: Two times a day (BID) | RESPIRATORY_TRACT | Status: DC
  Administered 2020-08-08 – 2020-08-09 (×3): 3 mL via RESPIRATORY_TRACT
  Filled 2020-08-07 (×3): qty 3

## 2020-08-07 NOTE — ED Notes (Signed)
Hospitalist at bedside 

## 2020-08-07 NOTE — ED Triage Notes (Signed)
Pt BIB from [facility] by EMS c/o SOB and Chest Pain. Per EMS, O2 sat on r/a 97%, but the pt was breathing at about 45-50 per minute. Oxygen at 3L given and an Albuterol/Atrovent Neb and 125mg  of solumedrol.

## 2020-08-07 NOTE — Progress Notes (Signed)
A consult was received from an ED physician for vancomycin & cefepime per pharmacy dosing.  The patient's profile has been reviewed for ht/wt/allergies/indication/available labs.   A one time order has been placed for cefepime 2 gm and vancomycin 1500 mg IV x 1 dose.    Further antibiotics/pharmacy consults should be ordered by admitting physician if indicated.                       Thank you, Eudelia Bunch, Pharm.D 08/07/2020 1:19 PM

## 2020-08-07 NOTE — ED Notes (Signed)
ED TO INPATIENT HANDOFF REPORT  Name/Age/Gender Andres Fernandez 64 y.o. male  Code Status    Code Status Orders  (From admission, onward)         Start     Ordered   08/07/20 1514  Full code  Continuous        08/07/20 1514        Code Status History    Date Active Date Inactive Code Status Order ID Comments User Context   11/23/2017 1932 12/06/2017 1834 Full Code [id number]  Bonnielee Haff, MD Inpatient   11/17/2017 0821 11/22/2017 1810 Full Code [id number]  Samella Parr, NP ED   Advance Care Planning Activity      Home/SNF/Other Home  Chief Complaint Sepsis due to pneumonia ([place]) [J18.9, A41.9]  Level of Care/Admitting Diagnosis ED Disposition    ED Disposition Condition [facility] Area: [facility] [[id number]]  Level of Care: Telemetry [5]  Admit to tele based on following criteria: Complex arrhythmia (Bradycardia/Tachycardia)  May admit patient to [facility] or [facility] if equivalent level of care is available:: Yes  Covid Evaluation: Confirmed COVID Negative  Diagnosis: Sepsis due to pneumonia [facility]) [[id number]]  Admitting Physician: Donne Hazel [[id number]]  Attending Physician: Donne Hazel [[id number]]  Estimated length of stay: 3 - 4 days  Certification:: I certify this patient will need inpatient services for at least 2 midnights       Medical History Past Medical History:  Diagnosis Date  . BPH (benign prostatic hyperplasia)   . Hepatitis C 11/27/2017  . Hypertension   . 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 Tobacco abuse     Allergies No Known Allergies  IV Location/Drains/Wounds Patient Lines/Drains/Airways Status    Active Line/Drains/Airways    Name Placement date Placement time Site Days   Peripheral IV 08/07/20 Left Antecubital 08/07/20  --  Antecubital  less than 1   Peripheral IV 08/07/20 Right Antecubital 08/07/20  1143  Antecubital  less than 1   Incision (Closed) 11/20/17 Back Left;Lower  11/20/17  0919   991   Incision (Closed) 11/20/17 Hip Right 11/20/17  1323   991          Labs/Imaging Results for orders placed or performed during the hospital encounter of 08/07/20 (from the past 48 hour(s))  SARS Coronavirus 2 by RT PCR (hospital order, performed in [facility] hospital lab) Nasopharyngeal Nasopharyngeal Swab     Status: None   Collection Time: 08/07/20 11:44 AM   Specimen: Nasopharyngeal Swab  Result Value Ref Range   SARS Coronavirus 2 NEGATIVE NEGATIVE    Comment: (NOTE) SARS-CoV-2 target nucleic acids are NOT DETECTED.  The SARS-CoV-2 RNA is generally detectable in upper and lower respiratory specimens during the acute phase of infection. The lowest concentration of SARS-CoV-2 viral copies this assay can detect is 250 copies / mL. A negative result does not preclude SARS-CoV-2 infection and should not be used as the sole basis for treatment or other patient management decisions.  A negative result may occur with improper specimen collection / handling, submission of specimen other than nasopharyngeal swab, presence of viral mutation(s) within the areas targeted by this assay, and inadequate number of viral copies (<250 copies / mL). A negative result must be combined with clinical observations, patient history, and epidemiological information.  Fact Sheet for Patients:   StrictlyIdeas.no  Fact Sheet for Healthcare Providers: BankingDealers.co.za  This test is not yet approved or  cleared  by the [place] and has been authorized for detection and/or diagnosis of SARS-CoV-2 by FDA under an Emergency Use Authorization (EUA).  This EUA will remain in effect (meaning this test can be used) for the duration of the COVID-19 declaration under Section 564(b)(1) of the Act, 21 U.S.C. section 360bbb-3(b)(1), unless the authorization is terminated or revoked sooner.  Performed at [facility], [place] [address], [place], [place] [address]   CBC with Differential     Status: Abnormal   Collection Time: 08/07/20 11:44 AM  Result Value Ref Range   WBC 9.1 4.0 - 10.5 K/uL   RBC 3.79 (L) 4.22 - 5.81 MIL/uL   Hemoglobin 12.3 (L) 13.0 - 17.0 g/dL   HCT 36.4 (L) 39 - 52 %   MCV 96.0 80.0 - 100.0 fL   MCH 32.5 26.0 - 34.0 pg   MCHC 33.8 30.0 - 36.0 g/dL   RDW 16.3 (H) 11.5 - 15.5 %   Platelets 161 150 - 400 K/uL   nRBC 0.0 0.0 - 0.2 %   Neutrophils Relative % 79 %   Neutro Abs 7.1 1.7 - 7.7 K/uL   Lymphocytes Relative 14 %   Lymphs Abs 1.3 0.7 - 4.0 K/uL   Monocytes Relative 6 %   Monocytes Absolute 0.5 0 - 1 K/uL   Eosinophils Relative 0 %   Eosinophils Absolute 0.0 0 - 0 K/uL   Basophils Relative 0 %   Basophils Absolute 0.0 0 - 0 K/uL   Immature Granulocytes 1 %   Abs Immature Granulocytes 0.08 (H) 0.00 - 0.07 K/uL   Reactive, Benign Lymphocytes PRESENT     Comment: Performed at [facility], [place] [address], [place], [place] [address]  Comprehensive metabolic panel     Status: Abnormal   Collection Time: 08/07/20 11:44 AM  Result Value Ref Range   Sodium 143 135 - 145 mmol/L   Potassium 4.2 3.5 - 5.1 mmol/L   Chloride 108 98 - 111 mmol/L   CO2 22 22 - 32 mmol/L   Glucose, Bld 115 (H) 70 - 99 mg/dL    Comment: Glucose reference range applies only to samples taken after fasting for at least 8 hours.   BUN 37 (H) 8 - 23 mg/dL   Creatinine, Ser 2.31 (H) 0.61 - 1.24 mg/dL   Calcium 8.7 (L) 8.9 - 10.3 mg/dL   Total Protein 7.6 6.5 - 8.1 g/dL   Albumin 4.0 3.5 - 5.0 g/dL   AST 31 15 - 41 U/L   ALT 20 0 - 44 U/L   Alkaline Phosphatase 62 38 - 126 U/L   Total Bilirubin 0.4 0.3 - 1.2 mg/dL   GFR calc non Af Amer 29 (L) >60 mL/min   GFR calc Af Amer 34 (L) >60 mL/min   Anion gap 13 5 - 15    Comment: Performed at [facility], [place] [address] [place], [place] [address]  Blood culture (routine x 2)     Status: None  (Preliminary result)   Collection Time: 08/07/20 11:44 AM   Specimen: BLOOD  Result Value Ref Range   Specimen Description      BLOOD LEFT ANTECUBITAL Performed at [facility], [place] [address] [place], [place] [address]    Special Requests      BOTTLES DRAWN AEROBIC AND ANAEROBIC Blood Culture adequate volume Performed at [place] [address] [place], [place] [address]    Culture  PENDING    Report Status PENDING   Blood culture (routine x 2)     Status: None (Preliminary result)   Collection Time: 08/07/20 11:44 AM   Specimen: BLOOD  Result Value Ref Range   Specimen Description      BLOOD RIGHT ANTECUBITAL Performed at [facility], [place] [address] [place], [place] [address]    Special Requests      BOTTLES DRAWN AEROBIC AND ANAEROBIC Blood Culture results may not be optimal due to an excessive volume of blood received in culture bottles Performed at [place] [address], [place], [place] [address]    Culture PENDING    Report Status PENDING   Lactic acid, plasma     Status: Abnormal   Collection Time: 08/07/20 11:44 AM  Result Value Ref Range   Lactic Acid, Venous 2.5 (HH) 0.5 - 1.9 mmol/L    Comment: CRITICAL RESULT CALLED TO, READ BACK BY AND VERIFIED WITH: Imogene Sink RN [id number] 08/07/20 JM Performed at [facility], [place] [address] [place], [place] [address]   D-dimer, quantitative (not at [facility], [place])     Status: Abnormal   Collection Time: 08/07/20 11:44 AM  Result Value Ref Range   D-Dimer, Quant 1.47 (H) 0.00 - 0.50 ug/mL-FEU    Comment: (NOTE) At the manufacturer cut-off of 0.50 ug/mL FEU, this assay has been documented to exclude PE with a sensitivity and negative predictive value of 97 to 99%.  At this time, this assay has not been approved by the FDA to exclude DVT/VTE. Results should be correlated with clinical presentation. Performed at [facility], [place]  [address] [place], [place] [address]   Troponin I (High Sensitivity)     Status: None   Collection Time: 08/07/20 11:44 AM  Result Value Ref Range   Troponin I (High Sensitivity) 5 <18 ng/L    Comment: (NOTE) Elevated high sensitivity troponin I (hsTnI) values and significant  changes across serial measurements may suggest ACS but many other  chronic and acute conditions are known to elevate hsTnI results.  Refer to the "Links" section for chest pain algorithms and additional  guidance. Performed at [facility], [place] [address] [place], [place] [address]   Brain natriuretic peptide     Status: Abnormal   Collection Time: 08/07/20 11:44 AM  Result Value Ref Range   B Natriuretic Peptide 129.6 (H) 0.0 - 100.0 pg/mL    Comment: Performed at [facility], [place] [address] [place], [place] [address]  Lactic acid, plasma     Status: Abnormal   Collection Time: 08/07/20 12:36 PM  Result Value Ref Range   Lactic Acid, Venous 2.2 (HH) 0.5 - 1.9 mmol/L    Comment: CRITICAL VALUE NOTED.  VALUE IS CONSISTENT WITH PREVIOUSLY REPORTED AND CALLED VALUE. Performed at [facility], [place] [address], [place], [place] [address]   Urinalysis, Routine w reflex microscopic Urine, Clean Catch     Status: Abnormal   Collection Time: 08/07/20  1:14 PM  Result Value Ref Range   Color, Urine YELLOW YELLOW   APPearance CLEAR CLEAR   Specific Gravity, Urine 1.016 1.005 - 1.030   pH 5.0 5.0 - 8.0   Glucose, UA NEGATIVE NEGATIVE mg/dL   Hgb urine dipstick SMALL (A) NEGATIVE   Bilirubin Urine NEGATIVE NEGATIVE   Ketones, ur NEGATIVE NEGATIVE mg/dL   Protein, ur 30 (A) NEGATIVE mg/dL   Nitrite NEGATIVE NEGATIVE  Leukocytes,Ua NEGATIVE NEGATIVE   RBC / HPF 0-5 0 - 5 RBC/hpf   WBC, UA 0-5 0 - 5 WBC/hpf   Bacteria, UA NONE SEEN NONE SEEN   Mucus PRESENT    Hyaline Casts, UA PRESENT     Comment: Performed at [facility], [place]  [address], [place], [place] [address]   DG Chest Portable 1 View  Result Date: 08/07/2020 CLINICAL DATA:  Shortness of breath and chest pain. Per EMS, O2 sat on r/a 97%, but the pt was breathing at about 45-50 times per minute. Medical hx of cancer and HTN. EXAM: PORTABLE CHEST 1 VIEW COMPARISON:  Chest radiograph 02/27/2019 FINDINGS: Interval removal of a right central venous catheter. Stable cardiomediastinal contours with normal heart size. The lungs are clear. No pneumothorax or large pleural effusion. No acute osseous abnormality. IMPRESSION: No acute cardiopulmonary process. Electronically Signed   By: Audie Pinto M.D.   On: 08/07/2020 12:04    Pending Labs Unresulted Labs (From admission, onward)          Start     Ordered   08/14/20 0500  Creatinine, serum  (enoxaparin (LOVENOX)    CrCl >/= 30 ml/min)  Weekly,   R     Comments: while on enoxaparin therapy    08/07/20 1514   08/08/20 0500  Comprehensive metabolic panel  Tomorrow morning,   R        08/07/20 1514   08/08/20 0500  CBC  Tomorrow morning,   R        08/07/20 1514   08/07/20 1514  HIV Antibody (routine testing w rflx)  (HIV Antibody (Routine testing w reflex) panel)  Once,   STAT        08/07/20 1514   08/07/20 1316  Respiratory Panel by PCR  (Respiratory virus panel with precautions)  Once,   STAT        08/07/20 1315   08/07/20 1314  Urine culture  ONCE - STAT,   STAT        08/07/20 1314          Vitals/Pain Today's Vitals   08/07/20 1500 08/07/20 1530 08/07/20 1545 08/07/20 1621  BP: 137/88 132/70 139/83 138/89  Pulse: 98 93 (!) 101 83  Resp: (!) 24 (!) 22  20  Temp:    98 F (36.7 C)  TempSrc:    Oral  SpO2: 100% 99% 100% 99%  Weight:      Height:      PainSc:        Isolation Precautions Droplet precaution  Medications Medications  enoxaparin (LOVENOX) injection 40 mg (has no administration in time range)  acetaminophen (TYLENOL) tablet 650 mg (has no administration in time range)     Or  acetaminophen (TYLENOL) suppository 650 mg (has no administration in time range)  ipratropium-albuterol (DUONEB) 0.5-2.5 (3) MG/3ML nebulizer solution 3 mL (has no administration in time range)  ipratropium-albuterol (DUONEB) 0.5-2.5 (3) MG/3ML nebulizer solution 3 mL (has no administration in time range)  methylPREDNISolone sodium succinate (SOLU-MEDROL) 40 mg/mL injection 40 mg (has no administration in time range)  cefTRIAXone (ROCEPHIN) 2 g in sodium chloride 0.9 % 100 mL IVPB (has no administration in time range)  azithromycin (ZITHROMAX) tablet 500 mg (has no administration in time range)  tiZANidine (ZANAFLEX) tablet 2 mg (has no administration in time range)  nicotine (NICODERM CQ - dosed in mg/24 hours) patch 14 mg (has no administration in time range)  albuterol (VENTOLIN HFA) 108 (  90 Base) MCG/ACT inhaler 10 puff (10 puffs Inhalation Given 08/07/20 1125)  ipratropium (ATROVENT HFA) inhaler 2 puff (2 puffs Inhalation Given 08/07/20 1125)  acetaminophen (TYLENOL) tablet 1,000 mg (1,000 mg Oral Given 08/07/20 1125)  metroNIDAZOLE (FLAGYL) IVPB 500 mg (0 mg Intravenous Stopped 08/07/20 1511)  lactated ringers bolus 1,000 mL (1,000 mLs Intravenous New Bag/Given (Non-Interop) 08/07/20 1411)  ceFEPIme (MAXIPIME) 2 g in sodium chloride 0.9 % 100 mL IVPB (0 g Intravenous Stopped 08/07/20 1443)    Mobility walks

## 2020-08-07 NOTE — ED Provider Notes (Signed)
[facility] DEPT Provider Note   CSN: [id number] Arrival date & time: 08/07/20  2919     History Chief Complaint  Patient presents with  . Shortness of Breath  . Chest Pain    Andres Fernandez is a 64 y.o. male.  HPI      64yo male with history of htn, plasma cell leukemia presents with concern for shortness of breath.  Reports syptoms began Tuesday with shortness of breath, congestion but worsened yesterday and today.  Cough productive of white sputum.  Was seen at urgent care today with negative COVID 19 antigen testing.  Denies chest pain.  Reports history of smoking but no prior diagnosis of COPD or asthma.  No leg pain or swelling.  No known sick contacts.  Reports he is vaccinated against COVID-19, wears a mask, and generally stays home.  Shortness of breath significantly worsening and severe at this time.  Does report chills, decreased appetite.  Reports some decreased taste but reports he still has some.  Patient not hypoxic, however significantly tachypneic to the 40s with EMS.  He was given nebulizers and 125 Solu-Medrol.  Past Medical History:  Diagnosis Date  . BPH (benign prostatic hyperplasia)   . Hepatitis C 11/27/2017  . Hypertension   . Plasma cell leukemia ([place]) 11/23/2017  . Tobacco abuse     Patient Active Problem List   Diagnosis Date Noted  . Sepsis due to pneumonia ([place]) 08/07/2020  . COPD with acute bronchitis ([place]) 08/07/2020  . Counseling regarding advanced care planning and goals of care 02/14/2018  . Plasma cell leukemia not having achieved remission ([place]) 12/26/2017  . Malnutrition of moderate degree 12/02/2017  . AKI (acute kidney injury) ([place]) 11/27/2017  . SIRS (systemic inflammatory response syndrome) ([place]) 11/27/2017  . Anemia 11/27/2017  . Thrombocytopenia ([place]) 11/27/2017  . Chronic hepatitis C without hepatic coma ([place]) 11/27/2017  . Cancer associated pain   . Tumor lysis syndrome   . Encounter for  antineoplastic chemotherapy   . Plasma cell leukemia ([place]) 11/23/2017  . Pathological fracture in neoplastic disease, right femur, initial encounter for fracture ([place]) 11/23/2017  . Lytic bone lesion of right femur 11/20/2017  . Acute renal failure ([place]) 11/17/2017  . ?? Multiple myeloma vs other bone marrow malignancy 11/17/2017  . Hypercalcemia 11/17/2017  . BPH (benign prostatic hyperplasia) 11/17/2017  . Tobacco abuse 11/17/2017  . Elevated blood-pressure reading without diagnosis of hypertension 11/17/2017  . Lytic bone lesions on xray 11/17/2017    Past Surgical History:  Procedure Laterality Date  . APPENDECTOMY    . FEMUR IM NAIL Right 11/20/2017   Procedure: RIGHT FEMORAL INTRAMEDULLARY (IM) NAIL;  Surgeon: Nicholes Stairs, MD;  Location: [facility];  Service: Orthopedics;  Laterality: Right;       Family History  Problem Relation Age of Onset  . COPD Mother   . Liver cancer Mother     Social History   Tobacco Use  . Smoking status: Current Every Day Smoker    Packs/day: 0.25    Types: Cigarettes    Last attempt to quit: 11/09/2017    Years since quitting: 2.7  . Smokeless tobacco: Never Used  . Tobacco comment: 1 cigarette/day as of 09/25/18  Vaping Use  . Vaping Use: Never used  Substance Use Topics  . Alcohol use: Yes  . Drug use: No    Home Medications Prior to Admission medications   Medication Sig Start Date End Date Taking? Authorizing Provider  acyclovir (ZOVIRAX) 400 MG tablet Take 1 tablet (400 mg total) by mouth daily. Patient taking differently: Take 400 mg by mouth at bedtime.  11/28/19  Yes Brunetta Genera, MD  aspirin EC 81 MG tablet Take 1 tablet (81 mg total) by mouth daily. Patient taking differently: Take 81 mg by mouth at bedtime.  11/28/19  Yes Brunetta Genera, MD  cholecalciferol (VITAMIN D) 400 units TABS tablet Take 400 Units by mouth at bedtime.    Yes [provider]  lenalidomide (REVLIMID) 10 MG capsule Take  1 capsule (10 mg total) by mouth daily. Take for 21 days on, 7 days off, repeat every 28 days. 07/22/20  Yes Brunetta Genera, MD  Multiple Vitamins-Minerals (MULTIVITAMIN WITH MINERALS) tablet Take 1 tablet by mouth at bedtime.    Yes [provider]  nicotine (NICODERM CQ - DOSED IN MG/24 HOURS) 14 mg/24hr patch PLACE 1 PATCH (14 MG TOTAL) ONTO THE SKIN DAILY. Patient taking differently: Place 14 mg onto the skin daily.  07/01/20  Yes Brunetta Genera, MD  ondansetron (ZOFRAN) 4 MG tablet Take 2 tablets (8 mg total) by mouth every 8 (eight) hours as needed for nausea. 11/28/19  Yes Brunetta Genera, MD  tiZANidine (ZANAFLEX) 2 MG tablet Take 1 tablet (2 mg total) by mouth every 8 (eight) hours as needed for muscle spasms. 03/19/20  Yes Brunetta Genera, MD  Vitamin D, Ergocalciferol, (DRISDOL) 1.25 MG (50000 UNIT) CAPS capsule Take 1 capsule (50,000 Units total) by mouth once a week. Patient taking differently: Take 50,000 Units by mouth every Thursday.  03/19/20  Yes Brunetta Genera, MD  dexamethasone (DECADRON) 4 MG tablet Take 5 tablets (20 mg total) by mouth once a week. Patient not taking: Reported on 08/07/2020 10/28/18   Brunetta Genera, MD    Allergies    Patient has no known allergies.  Review of Systems   Review of Systems  Constitutional: Positive for appetite change, chills and fatigue. Negative for fever.  HENT: Negative for sore throat.   Eyes: Negative for visual disturbance.  Respiratory: Positive for cough, shortness of breath and wheezing.   Cardiovascular: Negative for chest pain.  Gastrointestinal: Negative for abdominal pain, diarrhea, nausea and vomiting.  Genitourinary: Negative for difficulty urinating.  Musculoskeletal: Negative for back pain and neck stiffness.  Skin: Negative for rash.  Neurological: Negative for syncope and headaches.    Physical Exam Updated Vital Signs BP 140/82 (BP Location: Left Arm)   Pulse 60   Temp 98.2  F (36.8 C) (Oral)   Resp 18   Ht 5' 8"  (1.727 m)   Wt 70 kg   SpO2 100%   BMI 23.46 kg/m   Physical Exam Vitals and nursing note reviewed.  Constitutional:      General: He is not in acute distress.    Appearance: He is well-developed. He is not diaphoretic.  HENT:     Head: Normocephalic and atraumatic.  Eyes:     Conjunctiva/sclera: Conjunctivae normal.  Cardiovascular:     Rate and Rhythm: Normal rate and regular rhythm.     Heart sounds: Normal heart sounds. No murmur heard.  No friction rub. No gallop.   Pulmonary:     Effort: Pulmonary effort is normal. No respiratory distress.     Breath sounds: Normal breath sounds. No wheezing or rales.  Abdominal:     General: There is no distension.     Palpations: Abdomen is soft.  Tenderness: There is no abdominal tenderness. There is no guarding.  Musculoskeletal:     Cervical back: Normal range of motion.  Skin:    General: Skin is warm and dry.  Neurological:     Mental Status: He is alert and oriented to person, place, and time.     ED Results / Procedures / Treatments   Labs (all labs ordered are listed, but only abnormal results are displayed) Labs Reviewed  RESPIRATORY PANEL BY PCR - Abnormal; Notable for the following components:      Result Value   Respiratory Syncytial Virus DETECTED (*)    All other components within normal limits  CBC WITH DIFFERENTIAL/PLATELET - Abnormal; Notable for the following components:   RBC 3.79 (*)    Hemoglobin 12.3 (*)    HCT 36.4 (*)    RDW 16.3 (*)    Abs Immature Granulocytes 0.08 (*)    All other components within normal limits  COMPREHENSIVE METABOLIC PANEL - Abnormal; Notable for the following components:   Glucose, Bld 115 (*)    BUN 37 (*)    Creatinine, Ser 2.31 (*)    Calcium 8.7 (*)    GFR calc non Af Amer 29 (*)    GFR calc Af Amer 34 (*)    All other components within normal limits  LACTIC ACID, PLASMA - Abnormal; Notable for the following components:    Lactic Acid, Venous 2.5 (*)    All other components within normal limits  LACTIC ACID, PLASMA - Abnormal; Notable for the following components:   Lactic Acid, Venous 2.2 (*)    All other components within normal limits  D-DIMER, QUANTITATIVE (NOT AT [facility]) - Abnormal; Notable for the following components:   D-Dimer, Quant 1.47 (*)    All other components within normal limits  BRAIN NATRIURETIC PEPTIDE - Abnormal; Notable for the following components:   B Natriuretic Peptide 129.6 (*)    All other components within normal limits  URINALYSIS, ROUTINE W REFLEX MICROSCOPIC - Abnormal; Notable for the following components:   Hgb urine dipstick SMALL (*)    Protein, ur 30 (*)    All other components within normal limits  COMPREHENSIVE METABOLIC PANEL - Abnormal; Notable for the following components:   Chloride 113 (*)    CO2 17 (*)    Glucose, Bld 117 (*)    BUN 42 (*)    Creatinine, Ser 1.98 (*)    Calcium 8.5 (*)    Albumin 3.4 (*)    GFR calc non Af Amer 35 (*)    GFR calc Af Amer 40 (*)    All other components within normal limits  CBC - Abnormal; Notable for the following components:   RBC 3.38 (*)    Hemoglobin 10.8 (*)    HCT 32.2 (*)    RDW 16.2 (*)    Platelets 120 (*)    All other components within normal limits  SARS CORONAVIRUS 2 BY RT PCR (HOSPITAL ORDER, [facility])  CULTURE, BLOOD (ROUTINE X 2)  CULTURE, BLOOD (ROUTINE X 2)  URINE CULTURE  HIV ANTIBODY (ROUTINE TESTING W REFLEX)  TROPONIN I (HIGH SENSITIVITY)  TROPONIN I (HIGH SENSITIVITY)    EKG None  MUSE not functioning---sinus tachycardia   Radiology DG Chest Portable 1 View  Result Date: 08/07/2020 CLINICAL DATA:  Shortness of breath and chest pain. Per EMS, O2 sat on r/a 97%, but the pt was breathing at about 45-50 times per minute. Medical hx  of cancer and HTN. EXAM: PORTABLE CHEST 1 VIEW COMPARISON:  Chest radiograph 02/27/2019 FINDINGS: Interval removal of a right central  venous catheter. Stable cardiomediastinal contours with normal heart size. The lungs are clear. No pneumothorax or large pleural effusion. No acute osseous abnormality. IMPRESSION: No acute cardiopulmonary process. Electronically Signed   By: Audie Pinto M.D.   On: 08/07/2020 12:04    Procedures .Critical Care Performed by: Gareth Morgan, MD Authorized by: Gareth Morgan, MD   Critical care provider statement:    Critical care time (minutes):  30   Critical care was time spent personally by me on the following activities:  Discussions with consultants, evaluation of patient's response to treatment, examination of patient, ordering and performing treatments and interventions, ordering and review of laboratory studies, ordering and review of radiographic studies, pulse oximetry, re-evaluation of patient's condition, obtaining history from patient or surrogate and review of old charts   (including critical care time)  Medications Ordered in ED Medications  enoxaparin (LOVENOX) injection 40 mg (40 mg Subcutaneous Given 08/07/20 2123)  acetaminophen (TYLENOL) tablet 650 mg (650 mg Oral Given 08/08/20 0201)    Or  acetaminophen (TYLENOL) suppository 650 mg ( Rectal See Alternative 08/08/20 0201)  ipratropium-albuterol (DUONEB) 0.5-2.5 (3) MG/3ML nebulizer solution 3 mL (has no administration in time range)  methylPREDNISolone sodium succinate (SOLU-MEDROL) 40 mg/mL injection 40 mg (40 mg Intravenous Given 08/07/20 1758)  cefTRIAXone (ROCEPHIN) 2 g in sodium chloride 0.9 % 100 mL IVPB ( Intravenous Stopped 08/07/20 2138)  azithromycin (ZITHROMAX) tablet 500 mg (500 mg Oral Given 08/07/20 1758)  tiZANidine (ZANAFLEX) tablet 2 mg (has no administration in time range)  nicotine (NICODERM CQ - dosed in mg/24 hours) patch 14 mg (14 mg Transdermal Patch Applied 08/07/20 1758)  ipratropium-albuterol (DUONEB) 0.5-2.5 (3) MG/3ML nebulizer solution 3 mL (has no administration in time range)  albuterol  (VENTOLIN HFA) 108 (90 Base) MCG/ACT inhaler 10 puff (10 puffs Inhalation Given 08/07/20 1125)  ipratropium (ATROVENT HFA) inhaler 2 puff (2 puffs Inhalation Given 08/07/20 1125)  acetaminophen (TYLENOL) tablet 1,000 mg (1,000 mg Oral Given 08/07/20 1125)  metroNIDAZOLE (FLAGYL) IVPB 500 mg (0 mg Intravenous Stopped 08/07/20 1511)  lactated ringers bolus 1,000 mL (1,000 mLs Intravenous New Bag/Given (Non-Interop) 08/07/20 1411)  ceFEPIme (MAXIPIME) 2 g in sodium chloride 0.9 % 100 mL IVPB (0 g Intravenous Stopped 08/07/20 1443)    ED Course  I have reviewed the triage vital signs and the nursing notes.  Pertinent labs & imaging results that were available during my care of the patient were reviewed by me and considered in my medical decision making (see chart for details).    MDM Rules/Calculators/A&P                          64yo male with history of htn, plasma cell leukemia presents with concern for shortness of breath.  Differential diagnosis for dyspnea includes ACS, PE, COPD exacerbation, CHF exacerbation, anemia, pneumonia, viral etiology such as COVID 19 infection, metabolic abnormality, sepsis.   Chest x-ray was done which showed no acute abnormalitis. EKG was evaluated by me which showed tachycardia.  No sign of CHF.  Initial clinical concern after CXR for viral infection vs undiagnosed COPD with exacerbation given history of smoking vs PE.  DDimer ordered and positive, however renal function precludes CTA.  Fever found to be 102 rectally, and at this time suspect infectious etiology of ddimer and discussed with hospitalist that we will  continue to monitor and make clinical decision later about need for VQ scan.  Blood cx and empiric abx ordered given fever, risk factors.   Pt improved following solumedrol with EMS, albuterol/atrovent in ED. Admitted for further care.   Final Clinical Impression(s) / ED Diagnoses Final diagnoses:  Shortness of breath  Tachypnea  Fever, unspecified   Acute bronchitis, unspecified organism    Rx / DC Orders ED Discharge Orders    None       Gareth Morgan, MD 08/08/20 [phone]

## 2020-08-07 NOTE — ED Notes (Signed)
Date and time results received: 08/07/20 12:40 PM  (use smartphrase ".now" to insert current time)  Test: Lactic Critical Value: 2.5  Name of Provider Notified: Notified Carley, RN  Orders Received? Or Actions Taken?: Actions Taken: Water quality scientist, RN

## 2020-08-07 NOTE — H&P (Signed)
History and Physical    Andres Fernandez:[id number] DOB: 1956-10-30 DOA: 08/07/2020  PCP: Patient, No Pcp Per  Patient coming from: Home  Chief Complaint: SOB  HPI: Andres Fernandez is a 64 y.o. 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  Patient did undergo scheduled infusion for his history of plasma cell leukemia on 08/06/2020.  Patient noted gradually increasing shortness of breath which ultimately prompted in referral to the emergency department.  At home, patient noted to have temperatures in the upper 90s.  Does report some lower chest wall pain worse with coughing.  Also reports increased congestion.    Patient is fully vaccinated against SWFUX-32 with the [facility] vaccine  ED Course: The emergency department, patient was noted to have a temperature of 102.2 F rectally.  She was also noted to be tachypneic with initial respiratory rate of 40 breaths/min, initially requiring 3 L nasal cannula.  Also noted to be tachycardic with heart rate in excess of 120 bpm.  Chest x-ray performed and was found to be unremarkable for acute process.  Covid test was found to be negative.  Given concerns of sepsis, patient was started on broad-spectrum antibiotics.  Respiratory viral panel was ordered as were blood cultures.  Hospitalist service consulted for consideration for medical admission  Review of Systems:  Review of Systems  Constitutional: Positive for chills, fever and malaise/fatigue.  HENT: Positive for congestion. Negative for ear discharge, ear pain and nosebleeds.   Eyes: Negative for double vision, photophobia and pain.  Respiratory: Positive for cough and shortness of breath. Negative for  wheezing.   Cardiovascular: Positive for chest pain. Negative for orthopnea and claudication.  Gastrointestinal: Negative for abdominal pain and nausea.  Genitourinary: Negative for frequency, hematuria and urgency.  Musculoskeletal: Negative for back pain, joint pain and neck pain.  Neurological: Negative for speech change, focal weakness and seizures.  Psychiatric/Behavioral: Negative for hallucinations and memory loss. The patient is not nervous/anxious.     Past Medical History:  Diagnosis Date  . BPH (benign prostatic hyperplasia)   . Hepatitis C 11/27/2017  . Hypertension   . Plasma cell leukemia ([place]) 11/23/2017  . Tobacco abuse     Past Surgical History:  Procedure Laterality Date  . APPENDECTOMY    . FEMUR IM NAIL Right 11/20/2017   Procedure: RIGHT FEMORAL INTRAMEDULLARY (IM) NAIL;  Surgeon: Nicholes Stairs, MD;  Location: [facility];  Service: Orthopedics;  Laterality: Right;     reports that he has been smoking cigarettes. He has been smoking about 0.25 packs per day. He has never used smokeless tobacco. He reports current alcohol use. He reports that he does not use drugs.  No Known Allergies  Family History  Problem Relation Age of Onset  . COPD Mother   . Liver cancer Mother     Prior to Admission medications   Medication Sig Start Date End Date Taking? Authorizing Provider  acyclovir (ZOVIRAX) 400 MG tablet Take 1 tablet (400 mg total) by mouth daily. 11/28/19   Brunetta Genera, MD  aspirin EC 81 MG tablet Take 1 tablet (81 mg total) by mouth daily. 11/28/19   Brunetta Genera, MD  cholecalciferol (VITAMIN D) 400 units TABS tablet Take 400 Units by mouth daily.  [provider]  dexamethasone (DECADRON) 4 MG tablet Take 5 tablets (20 mg total) by mouth once a week. 10/28/18   Brunetta Genera, MD  lenalidomide (REVLIMID) 10 MG capsule Take 1 capsule (10 mg total) by mouth daily. Take for 21 days on, 7 days off, repeat every 28 days.  07/22/20   Brunetta Genera, MD  Multiple Vitamins-Minerals (MULTIVITAMIN WITH MINERALS) tablet Take 1 tablet by mouth daily.    [provider]  nicotine (NICODERM CQ - DOSED IN MG/24 HOURS) 14 mg/24hr patch PLACE 1 PATCH (14 MG TOTAL) ONTO THE SKIN DAILY. 07/01/20   Brunetta Genera, MD  ondansetron (ZOFRAN) 4 MG tablet Take 2 tablets (8 mg total) by mouth every 8 (eight) hours as needed for nausea. 11/28/19   Brunetta Genera, MD  tiZANidine (ZANAFLEX) 2 MG tablet Take 1 tablet (2 mg total) by mouth every 8 (eight) hours as needed for muscle spasms. 03/19/20   Brunetta Genera, MD  Vitamin D, Ergocalciferol, (DRISDOL) 1.25 MG (50000 UNIT) CAPS capsule Take 1 capsule (50,000 Units total) by mouth once a week. 03/19/20   Brunetta Genera, MD    Physical Exam: Vitals:   08/07/20 1339 08/07/20 1400 08/07/20 1406 08/07/20 1411  BP: 134/68 132/77  132/77  Pulse: 99 95  99  Resp: 18 (!) 30  (!) 24  Temp:   99.1 F (37.3 C)   TempSrc:   Oral   SpO2: 99% 98%  99%  Weight:      Height:        Constitutional: NAD, calm, comfortable Vitals:   08/07/20 1339 08/07/20 1400 08/07/20 1406 08/07/20 1411  BP: 134/68 132/77  132/77  Pulse: 99 95  99  Resp: 18 (!) 30  (!) 24  Temp:   99.1 F (37.3 C)   TempSrc:   Oral   SpO2: 99% 98%  99%  Weight:      Height:       Eyes: PERRL, lids and conjunctivae normal ENMT: Mucous membranes are moist. Posterior pharynx clear of any exudate or lesions.Normal dentition.  Neck: normal, supple, no masses, no thyromegaly Respiratory: clear to auscultation bilaterally, decreased breath sounds throughout, mildly increased respiratory effort Cardiovascular: Regular rate and rhythm,, S2 Abdomen: no tenderness, no masses palpated.. Bowel sounds positive.  Musculoskeletal: no clubbing / cyanosis. No joint deformity upper and lower extremities. Good ROM, no contractures. Normal muscle tone.  Skin: no rashes, lesions, ulcers. No  induration Neurologic: CN 2-12 grossly intact. Sensation intact, no tremors Psychiatric: Normal judgment and insight. Alert and oriented x 3. Normal mood.    Labs on Admission: I have personally reviewed following labs and imaging studies  CBC: Recent Labs  Lab 08/06/20 1009 08/07/20 1144  WBC 7.2 9.1  NEUTROABS 5.1 7.1  HGB 11.9* 12.3*  HCT 35.1* 36.4*  MCV 93.1 96.0  PLT 187 725   Basic Metabolic Panel: Recent Labs  Lab 08/06/20 1009 08/07/20 1144  NA 139 143  K 4.1 4.2  CL 111 108  CO2 18* 22  GLUCOSE 120* 115*  BUN 26* 37*  CREATININE 1.92* 2.31*  CALCIUM 9.2 8.7*   GFR: Estimated Creatinine Clearance: 31.7 mL/min (A) (by C-G formula based on SCr of 2.31 mg/dL (H)). Liver Function Tests: Recent Labs  Lab 08/06/20 1009 08/07/20 1144  AST 12* 31  ALT 13 20  ALKPHOS 69 62  BILITOT 0.4 0.4  PROT 7.0 7.6  ALBUMIN 3.8 4.0   No results for input(s):  LIPASE, AMYLASE in the last 168 hours. No results for input(s): AMMONIA in the last 168 hours. Coagulation Profile: No results for input(s): INR, PROTIME in the last 168 hours. Cardiac Enzymes: No results for input(s): CKTOTAL, CKMB, CKMBINDEX, TROPONINI in the last 168 hours. BNP (last 3 results) No results for input(s): PROBNP in the last 8760 hours. HbA1C: No results for input(s): HGBA1C in the last 72 hours. CBG: No results for input(s): GLUCAP in the last 168 hours. Lipid Profile: No results for input(s): CHOL, HDL, LDLCALC, TRIG, CHOLHDL, LDLDIRECT in the last 72 hours. Thyroid Function Tests: No results for input(s): TSH, T4TOTAL, FREET4, T3FREE, THYROIDAB in the last 72 hours. Anemia Panel: No results for input(s): VITAMINB12, FOLATE, FERRITIN, TIBC, IRON, RETICCTPCT in the last 72 hours. Urine analysis:    Component Value Date/Time   COLORURINE YELLOW 11/25/2017 1605   APPEARANCEUR HAZY (A) 11/25/2017 1605   LABSPEC 1.006 11/25/2017 1605   PHURINE 5.0 11/25/2017 1605   GLUCOSEU NEGATIVE  11/25/2017 1605   HGBUR MODERATE (A) 11/25/2017 1605   BILIRUBINUR NEGATIVE 11/25/2017 1605   KETONESUR NEGATIVE 11/25/2017 1605   PROTEINUR NEGATIVE 11/25/2017 1605   UROBILINOGEN 0.2 04/13/2015 1410   NITRITE NEGATIVE 11/25/2017 1605   LEUKOCYTESUR NEGATIVE 11/25/2017 1605   Sepsis Labs: !!!!!!!!!!!!!!!!!!!!!!!!!!!!!!!!!!!!!!!!!!!! @LABRCNTIP (procalcitonin:4,lacticidven:4) ) Recent Results (from the past 240 hour(s))  SARS Coronavirus 2 by RT PCR (hospital order, performed in [place] hospital lab) Nasopharyngeal Nasopharyngeal Swab     Status: None   Collection Time: 08/07/20 11:44 AM   Specimen: Nasopharyngeal Swab  Result Value Ref Range Status   SARS Coronavirus 2 NEGATIVE NEGATIVE Final    Comment: (NOTE) SARS-CoV-2 target nucleic acids are NOT DETECTED.  The SARS-CoV-2 RNA is generally detectable in upper and lower respiratory specimens during the acute phase of infection. The lowest concentration of SARS-CoV-2 viral copies this assay can detect is 250 copies / mL. A negative result does not preclude SARS-CoV-2 infection and should not be used as the sole basis for treatment or other patient management decisions.  A negative result may occur with improper specimen collection / handling, submission of specimen other than nasopharyngeal swab, presence of viral mutation(s) within the areas targeted by this assay, and inadequate number of viral copies (<250 copies / mL). A negative result must be combined with clinical observations, patient history, and epidemiological information.  Fact Sheet for Patients:   StrictlyIdeas.no  Fact Sheet for Healthcare Providers: BankingDealers.co.za  This test is not yet approved or  cleared by the [place] FDA and has been authorized for detection and/or diagnosis of SARS-CoV-2 by FDA under an Emergency Use Authorization (EUA).  This EUA will remain in effect (meaning this test can  be used) for the duration of the COVID-19 declaration under Section 564(b)(1) of the Act, 21 U.S.C. section 360bbb-3(b)(1), unless the authorization is terminated or revoked sooner.  Performed at [facility], [place] [address], [place], [place] [address]      Radiological Exams on Admission: DG Chest Portable 1 View  Result Date: 08/07/2020 CLINICAL DATA:  Shortness of breath and chest pain. Per EMS, O2 sat on r/a 97%, but the pt was breathing at about 45-50 times per minute. Medical hx of cancer and HTN. EXAM: PORTABLE CHEST 1 VIEW COMPARISON:  Chest radiograph 02/27/2019 FINDINGS: Interval removal of a right central venous catheter. Stable cardiomediastinal contours with normal heart size. The lungs are clear. No pneumothorax or large pleural effusion. No acute osseous abnormality. IMPRESSION: No acute cardiopulmonary  process. Electronically Signed   By: Audie Pinto M.D.   On: 08/07/2020 12:04    EKG: Independently reviewed. 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 Sepsis secondary to likely bronchitis at time of presentation 1. Presents hypoxemic with increased wheezing 2. Chest x-ray personally reviewed, clear for acute process 3. Respiratory viral panel ordered in emergency department, currently pending 4. Patient is confirmed Covid negative this visit, was noted to be positive in November 2020 5. Broad-spectrum antibiotics was started in the emergency department, we will continue with empiric Rocephin and azithromycin 6. Lungs still sound tight on exam.  Will continue scheduled duo nebs and IV steroids, wean as tolerated 7. Repeat CBC and basic metabolic panel in the morning 2. History of chronic hep C 1. Abs reviewed,  LFTs within normal limits 2. On chart review, was undetectable in June 2019, repeat viral load noted to be 18 in July 2019 3. Would have patient follow-up closely as outpatient 3. History of plasma cell leukemia 1. Followed by Dr. Irene Limbo, last seen in clinic on 08/06/2020 2. CBC profile reviewed, unremarkable at this time 3. repeat CBC in the morning  DVT prophylaxis: Lovenox subq Code Status: Full Family Communication: Pt in room  Disposition Plan:   Consults called:  Admission status: Inpatient as it would likely require greater than 2 midnight stay for IV steroids and IV antibiotics presenting sepsis  Marylu Lund MD [facility] Pager On Amion  If 7PM-7AM, please contact night-coverage  08/07/2020, 2:19 PM

## 2020-08-07 NOTE — ED Notes (Signed)
ED TO INPATIENT HANDOFF REPORT  Name/Age/Gender Ian Malkin 64 y.o. male  Code Status Code Status History    Date Active Date Inactive Code Status Order ID Comments User Context   11/23/2017 1932 12/06/2017 1834 Full Code [id number]  Bonnielee Haff, MD Inpatient   11/17/2017 0821 11/22/2017 1810 Full Code [id number]  Samella Parr, NP ED   Advance Care Planning Activity    Questions for Most Recent Historical Code Status (Order [id number])       Home/SNF/Other Home  Chief Complaint Sepsis due to pneumonia ([place]) [J18.9, A41.9]  Level of Care/Admitting Diagnosis ED Disposition    ED Disposition Condition [facility] Area: [place] [[id number]]  Level of Care: Telemetry [5]  Admit to tele based on following criteria: Complex arrhythmia (Bradycardia/Tachycardia)  May admit patient to [facility] or [facility] if equivalent level of care is available:: Yes  Covid Evaluation: Confirmed COVID Negative  Diagnosis: Sepsis due to pneumonia [facility]) [[id number]]  Admitting Physician: Donne Hazel [[id number]]  Attending Physician: Donne Hazel [[id number]]  Estimated length of stay: 3 - 4 days  Certification:: I certify this patient will need inpatient services for at least 2 midnights       Medical History Past Medical History:  Diagnosis Date  . BPH (benign prostatic hyperplasia)   . Hepatitis C 11/27/2017  . Hypertension   . Plasma cell leukemia ([place]) 11/23/2017  . Tobacco abuse     Allergies No Known Allergies  IV Location/Drains/Wounds Patient Lines/Drains/Airways Status    Active Line/Drains/Airways    Name Placement date Placement time Site Days   Peripheral IV 08/07/20 Left Antecubital 08/07/20  --  Antecubital  less than 1   Peripheral IV 08/07/20 Right Antecubital 08/07/20  1143  Antecubital  less than 1   Incision (Closed) 11/20/17 Back Left;Lower 11/20/17  0919   991   Incision (Closed) 11/20/17 Hip Right 11/20/17  1323   991           Labs/Imaging Results for orders placed or performed during the hospital encounter of 08/07/20 (from the past 48 hour(s))  SARS Coronavirus 2 by RT PCR (hospital order, performed in [facility] hospital lab) Nasopharyngeal Nasopharyngeal Swab     Status: None   Collection Time: 08/07/20 11:44 AM   Specimen: Nasopharyngeal Swab  Result Value Ref Range   SARS Coronavirus 2 NEGATIVE NEGATIVE    Comment: (NOTE) SARS-CoV-2 target nucleic acids are NOT DETECTED.  The SARS-CoV-2 RNA is generally detectable in upper and lower respiratory specimens during the acute phase of infection. The lowest concentration of SARS-CoV-2 viral copies this assay can detect is 250 copies / mL. A negative result does not preclude SARS-CoV-2 infection and should not be used as the sole basis for treatment or other patient management decisions.  A negative result may occur with improper specimen collection / handling, submission of specimen other than nasopharyngeal swab, presence of viral mutation(s) within the areas targeted by this assay, and inadequate number of viral copies (<250 copies / mL). A negative result must be combined with clinical observations, patient history, and epidemiological information.  Fact Sheet for Patients:   StrictlyIdeas.no  Fact Sheet for Healthcare Providers: BankingDealers.co.za  This test is not yet approved or  cleared by the [place] FDA and has been authorized for detection and/or diagnosis of SARS-CoV-2 by FDA under an Emergency Use Authorization (EUA).  This EUA will remain in effect (meaning this test can be used)  for the duration of the COVID-19 declaration under Section 564(b)(1) of the Act, 21 U.S.C. section 360bbb-3(b)(1), unless the authorization is terminated or revoked sooner.  Performed at [facility], [place] [address], [place], [place] [address]   CBC with Differential      Status: Abnormal   Collection Time: 08/07/20 11:44 AM  Result Value Ref Range   WBC 9.1 4.0 - 10.5 K/uL   RBC 3.79 (L) 4.22 - 5.81 MIL/uL   Hemoglobin 12.3 (L) 13.0 - 17.0 g/dL   HCT 36.4 (L) 39 - 52 %   MCV 96.0 80.0 - 100.0 fL   MCH 32.5 26.0 - 34.0 pg   MCHC 33.8 30.0 - 36.0 g/dL   RDW 16.3 (H) 11.5 - 15.5 %   Platelets 161 150 - 400 K/uL   nRBC 0.0 0.0 - 0.2 %   Neutrophils Relative % 79 %   Neutro Abs 7.1 1.7 - 7.7 K/uL   Lymphocytes Relative 14 %   Lymphs Abs 1.3 0.7 - 4.0 K/uL   Monocytes Relative 6 %   Monocytes Absolute 0.5 0 - 1 K/uL   Eosinophils Relative 0 %   Eosinophils Absolute 0.0 0 - 0 K/uL   Basophils Relative 0 %   Basophils Absolute 0.0 0 - 0 K/uL   Immature Granulocytes 1 %   Abs Immature Granulocytes 0.08 (H) 0.00 - 0.07 K/uL   Reactive, Benign Lymphocytes PRESENT     Comment: Performed at [facility], [place] [address], [place], [place] [address]  Comprehensive metabolic panel     Status: Abnormal   Collection Time: 08/07/20 11:44 AM  Result Value Ref Range   Sodium 143 135 - 145 mmol/L   Potassium 4.2 3.5 - 5.1 mmol/L   Chloride 108 98 - 111 mmol/L   CO2 22 22 - 32 mmol/L   Glucose, Bld 115 (H) 70 - 99 mg/dL    Comment: Glucose reference range applies only to samples taken after fasting for at least 8 hours.   BUN 37 (H) 8 - 23 mg/dL   Creatinine, Ser 2.31 (H) 0.61 - 1.24 mg/dL   Calcium 8.7 (L) 8.9 - 10.3 mg/dL   Total Protein 7.6 6.5 - 8.1 g/dL   Albumin 4.0 3.5 - 5.0 g/dL   AST 31 15 - 41 U/L   ALT 20 0 - 44 U/L   Alkaline Phosphatase 62 38 - 126 U/L   Total Bilirubin 0.4 0.3 - 1.2 mg/dL   GFR calc non Af Amer 29 (L) >60 mL/min   GFR calc Af Amer 34 (L) >60 mL/min   Anion gap 13 5 - 15    Comment: Performed at [facility], [place] [address] [place], [place] [address]  Lactic acid, plasma     Status: Abnormal   Collection Time: 08/07/20 11:44 AM  Result Value Ref Range   Lactic Acid, Venous 2.5 (HH) 0.5 -  1.9 mmol/L    Comment: CRITICAL RESULT CALLED TO, READ BACK BY AND VERIFIED WITH: Heavener Sink RN [id number] 08/07/20 JM Performed at [facility], [place] [address] [place],  [address]   D-dimer, quantitative (not at [facility])     Status: Abnormal   Collection Time: 08/07/20 11:44 AM  Result Value Ref Range   D-Dimer, Quant 1.47 (H) 0.00 - 0.50 ug/mL-FEU    Comment: (NOTE) At the manufacturer cut-off of 0.50 ug/mL FEU, this assay has been documented to exclude PE with a sensitivity and negative  predictive value of 97 to 99%.  At this time, this assay has not been approved by the FDA to exclude DVT/VTE. Results should be correlated with clinical presentation. Performed at [facility], [place] [address] [place], [place] [address]   Troponin I (High Sensitivity)     Status: None   Collection Time: 08/07/20 11:44 AM  Result Value Ref Range   Troponin I (High Sensitivity) 5 <18 ng/L    Comment: (NOTE) Elevated high sensitivity troponin I (hsTnI) values and significant  changes across serial measurements may suggest ACS but many other  chronic and acute conditions are known to elevate hsTnI results.  Refer to the "Links" section for chest pain algorithms and additional  guidance. Performed at [facility], [place] [address], [place], [place] [address]   Brain natriuretic peptide     Status: Abnormal   Collection Time: 08/07/20 11:44 AM  Result Value Ref Range   B Natriuretic Peptide 129.6 (H) 0.0 - 100.0 pg/mL    Comment: Performed at [facility], [place] [address], [place], [place] [address]   DG Chest Portable 1 View  Result Date: 08/07/2020 CLINICAL DATA:  Shortness of breath and chest pain. Per EMS, O2 sat on r/a 97%, but the pt was breathing at about 45-50 times per minute. Medical hx of cancer and HTN. EXAM: PORTABLE CHEST 1 VIEW COMPARISON:  Chest radiograph 02/27/2019 FINDINGS: Interval removal of a right central  venous catheter. Stable cardiomediastinal contours with normal heart size. The lungs are clear. No pneumothorax or large pleural effusion. No acute osseous abnormality. IMPRESSION: No acute cardiopulmonary process. Electronically Signed   By: Audie Pinto M.D.   On: 08/07/2020 12:04    Pending Labs Unresulted Labs (From admission, onward)          Start     Ordered   08/07/20 1316  Respiratory Panel by PCR  (Respiratory virus panel with precautions)  Once,   STAT        08/07/20 1315   08/07/20 1314  Urinalysis, Routine w reflex microscopic Urine, Clean Catch  Once,   STAT        08/07/20 1314   08/07/20 1314  Urine culture  ONCE - STAT,   STAT        08/07/20 1314   08/07/20 1036  Blood culture (routine x 2)  BLOOD CULTURE X 2,   STAT      08/07/20 1036   08/07/20 1036  Lactic acid, plasma  Now then every 2 hours,   STAT      08/07/20 1036          Vitals/Pain Today's Vitals   08/07/20 1400 08/07/20 1406 08/07/20 1406 08/07/20 1411  BP: 132/77   132/77  Pulse: 95   99  Resp: (!) 30   (!) 24  Temp:  99.1 F (37.3 C)    TempSrc:  Oral    SpO2: 98%   99%  Weight:      Height:      PainSc:   2      Isolation Precautions Droplet precaution  Medications Medications  metroNIDAZOLE (FLAGYL) IVPB 500 mg (500 mg Intravenous New Bag/Given 08/07/20 1410)  ceFEPIme (MAXIPIME) 2 g in sodium chloride 0.9 % 100 mL IVPB (2 g Intravenous New Bag/Given (Non-Interop) 08/07/20 1409)  vancomycin (VANCOREADY) IVPB 1500 mg/300 mL (1,500 mg Intravenous New Bag/Given 08/07/20 1411)  albuterol (VENTOLIN HFA) 108 (90 Base) MCG/ACT inhaler 10 puff (10 puffs Inhalation  Given 08/07/20 1125)  ipratropium (ATROVENT HFA) inhaler 2 puff (2 puffs Inhalation Given 08/07/20 1125)  acetaminophen (TYLENOL) tablet 1,000 mg (1,000 mg Oral Given 08/07/20 1125)  lactated ringers bolus 1,000 mL (1,000 mLs Intravenous New Bag/Given (Non-Interop) 08/07/20 1411)    Mobility walks

## 2020-08-08 ENCOUNTER — Inpatient Hospital Stay (HOSPITAL_COMMUNITY): Payer: BC Managed Care – PPO

## 2020-08-08 LAB — COMPREHENSIVE METABOLIC PANEL
ALT: 17 U/L (ref 0–44)
AST: 27 U/L (ref 15–41)
Albumin: 3.4 g/dL — ABNORMAL LOW (ref 3.5–5.0)
Alkaline Phosphatase: 48 U/L (ref 38–126)
Anion gap: 11 (ref 5–15)
BUN: 42 mg/dL — ABNORMAL HIGH (ref 8–23)
CO2: 17 mmol/L — ABNORMAL LOW (ref 22–32)
Calcium: 8.5 mg/dL — ABNORMAL LOW (ref 8.9–10.3)
Chloride: 113 mmol/L — ABNORMAL HIGH (ref 98–111)
Creatinine, Ser: 1.98 mg/dL — ABNORMAL HIGH (ref 0.61–1.24)
GFR calc Af Amer: 40 mL/min — ABNORMAL LOW (ref >60)
GFR calc non Af Amer: 35 mL/min — ABNORMAL LOW (ref >60)
Glucose, Bld: 117 mg/dL — ABNORMAL HIGH (ref 70–99)
Potassium: 5 mmol/L (ref 3.5–5.1)
Sodium: 141 mmol/L (ref 135–145)
Total Bilirubin: 0.3 mg/dL (ref 0.3–1.2)
Total Protein: 6.6 g/dL (ref 6.5–8.1)

## 2020-08-08 LAB — CBC
HCT: 32.2 % — ABNORMAL LOW (ref 39.0–52.0)
Hemoglobin: 10.8 g/dL — ABNORMAL LOW (ref 13.0–17.0)
MCH: 32 pg (ref 26.0–34.0)
MCHC: 33.5 g/dL (ref 30.0–36.0)
MCV: 95.3 fL (ref 80.0–100.0)
Platelets: 120 10*3/uL — ABNORMAL LOW (ref 150–400)
RBC: 3.38 MIL/uL — ABNORMAL LOW (ref 4.22–5.81)
RDW: 16.2 % — ABNORMAL HIGH (ref 11.5–15.5)
WBC: 7.5 10*3/uL (ref 4.0–10.5)
nRBC: 0 % (ref 0.0–0.2)

## 2020-08-08 LAB — URINE CULTURE: Culture: <10000 — AB

## 2020-08-08 NOTE — Progress Notes (Signed)
SATURATION QUALIFICATIONS: (This note is used to comply with regulatory documentation for home oxygen)  Patient Saturations on Room Air at Rest = 100%  Patient Saturations on Room Air while Ambulating = 96-98%  Patient Saturations on 0 Liters of oxygen while Ambulating = 96-98%  Please briefly explain why patient needs home oxygen: Pt does not need home O2.

## 2020-08-08 NOTE — Progress Notes (Signed)
PROGRESS NOTE    Andres Fernandez  GNO:[id number] DOB: 1956-09-17 DOA: 08/07/2020 PCP: Patient, No Pcp Per  Brief Narrative:  76 BM Plasma cell myeloma/light chain dx foll Dr. Derwood Kaplan Revlimid previously-now carfilzomib/pamidronate Renal insuff 2/2 above foll Dr. 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 Bronchitis secondary to RSV, lactic acidosis on admission a. Patient RSV is positive I do not think he needs a VQ scan and have discontinued it b. Supportive management mainly at this time do not think there is evidence for palivizumab in adults c. He will be given a little bit of information about this on discharge 2. Elevated BNP a. Likely secondary to bronchitis-no further work-up currently as he has no lower extremity swelling and no other signs or symptoms. 3. Plasma cell myeloma- 4. Anemia of malignancy with mild thrombocytopenia 120 a. Monitor trends of blood counts over the next day or so b. Patient is a high risk patient because of his underlying malignancy and we will watch him closely over the next 24 hours and keep him hospitalized 5. Hyperkalemia with metabolic acidosis in the setting of light chain disease, chronic kidney disease stage IIIa underlying a. Bump in creatinine from baseline of about 2 but also now metabolic acidosis therefore copious fluids today b. His lactic acidosis is probably from underlying renal insufficiency 6. Hep C in the past a. Labs are stable  outpatient HCVRNA 7. Still smoker   DVT prophylaxis: Lovenox Code Status: Full Family Communication: None present Disposition: Inpatient for the next 24 to 48 hours  Status is: Inpatient  Remains inpatient appropriate because:Persistent severe electrolyte disturbances and IV treatments appropriate due to intensity of illness or inability to take PO   Dispo: The patient is from: Home              Anticipated d/c is to: Home              Anticipated d/c date is: 1 day              Patient currently is not medically stable to d/c.       Consultants:   None currently  Procedures: None  Antimicrobials: Discontinued antibiotics 8/29   Subjective: Awake coherent no distress seems a little bit concerned about his diagnosis I had a long talk about this and told him that in adults we do not typically use antivirals He does not need antibiotics He has no chest pain no fever no nausea no vomiting He looks fairly well and was able to ambulate to the bathroom without the oxygen but seems a little winded from the effort  Objective: Vitals:   08/07/20 2030 08/07/20 2125 08/08/20 0554 08/08/20 0600  BP:  137/80  140/82  Pulse:  76 84 60  Resp:    18  Temp:  98.6 F (37 C)  98.2 F (36.8 C)  TempSrc:  Oral  Oral  SpO2: 97% 100% 95% 100%  Weight:  Height:        Intake/Output Summary (Last 24 hours) at 08/08/2020 5852 Last data filed at 08/08/2020 0600 Gross per 24 hour  Intake 440.49 ml  Output 425 ml  Net 15.49 ml   Filed Weights   08/07/20 1036  Weight: 70 kg    Examination:  General exam: Awake coherent pleasant no distress EOMI NCAT no focal deficit Respiratory system: Clear no rales no rhonchi no adventitious sounds Cardiovascular system: S1-S2 slight tachycardia on monitors no unexpected findings Gastrointestinal system: Soft nontender no rebound no guarding. Central nervous system: Neurologically intact no focal deficit Extremities: No lower extremity  edema Skin: No swelling Psychiatry: Slightly anxious  Data Reviewed: I have personally reviewed following labs and imaging studies  Potassium up from 4.2-5.0 CO2 down from 22-17 BUN/creatinine 37/2.3-->42/1.9 BNP 129 Hemoglobin 10.8 down from 12.3 Platelet 161-120 White count 7   Radiology Studies: DG Chest Portable 1 View  Result Date: 08/07/2020 CLINICAL DATA:  Shortness of breath and chest pain. Per EMS, O2 sat on r/a 97%, but the pt was breathing at about 45-50 times per minute. Medical hx of cancer and HTN. EXAM: PORTABLE CHEST 1 VIEW COMPARISON:  Chest radiograph 02/27/2019 FINDINGS: Interval removal of a right central venous catheter. Stable cardiomediastinal contours with normal heart size. The lungs are clear. No pneumothorax or large pleural effusion. No acute osseous abnormality. IMPRESSION: No acute cardiopulmonary process. Electronically Signed   By: Audie Pinto M.D.   On: 08/07/2020 12:04     Scheduled Meds: . azithromycin  500 mg Oral Daily  . enoxaparin (LOVENOX) injection  40 mg Subcutaneous Q24H  . ipratropium-albuterol  3 mL Nebulization BID  . methylPREDNISolone (SOLU-MEDROL) injection  40 mg Intravenous Daily  . nicotine  14 mg Transdermal Daily   Continuous Infusions: . cefTRIAXone (ROCEPHIN)  IV Stopped (08/07/20 2138)     LOS: 1 day   50 Time spent: [place], MD [facility] To contact the attending provider between 7A-7P or the covering provider during after hours 7P-7A, please log into the web site [email or web address] and access using universal [place] password for that web site. If you do not have the password, please call the hospital operator.  08/08/2020, 8:08 AM

## 2020-08-09 ENCOUNTER — Encounter: Payer: Self-pay | Admitting: Family Medicine

## 2020-08-09 LAB — CBC WITH DIFFERENTIAL/PLATELET
Abs Immature Granulocytes: 0.05 10*3/uL (ref 0.00–0.07)
Basophils Absolute: 0 10*3/uL (ref 0.0–0.1)
Basophils Relative: 0 %
Eosinophils Absolute: 0 10*3/uL (ref 0.0–0.5)
Eosinophils Relative: 0 %
HCT: 37 % — ABNORMAL LOW (ref 39.0–52.0)
Hemoglobin: 12.3 g/dL — ABNORMAL LOW (ref 13.0–17.0)
Immature Granulocytes: 0 %
Lymphocytes Relative: 19 %
Lymphs Abs: 2.3 10*3/uL (ref 0.7–4.0)
MCH: 31.9 pg (ref 26.0–34.0)
MCHC: 33.2 g/dL (ref 30.0–36.0)
MCV: 96.1 fL (ref 80.0–100.0)
Monocytes Absolute: 1.2 10*3/uL — ABNORMAL HIGH (ref 0.1–1.0)
Monocytes Relative: 10 %
Neutro Abs: 8.7 10*3/uL — ABNORMAL HIGH (ref 1.7–7.7)
Neutrophils Relative %: 71 %
Platelets: 118 10*3/uL — ABNORMAL LOW (ref 150–400)
RBC: 3.85 MIL/uL — ABNORMAL LOW (ref 4.22–5.81)
RDW: 16.2 % — ABNORMAL HIGH (ref 11.5–15.5)
WBC: 12.2 10*3/uL — ABNORMAL HIGH (ref 4.0–10.5)
nRBC: 0 % (ref 0.0–0.2)

## 2020-08-09 LAB — MULTIPLE MYELOMA PANEL, SERUM
Albumin SerPl Elph-Mcnc: 3.6 g/dL (ref 2.9–4.4)
Albumin/Glob SerPl: 1.3 (ref 0.7–1.7)
Alpha 1: 0.3 g/dL (ref 0.0–0.4)
Alpha2 Glob SerPl Elph-Mcnc: 1.2 g/dL — ABNORMAL HIGH (ref 0.4–1.0)
B-Globulin SerPl Elph-Mcnc: 0.8 g/dL (ref 0.7–1.3)
Gamma Glob SerPl Elph-Mcnc: 0.7 g/dL (ref 0.4–1.8)
Globulin, Total: 3 g/dL (ref 2.2–3.9)
IgA: 82 mg/dL (ref 61–437)
IgG (Immunoglobin G), Serum: 653 mg/dL (ref 603–1613)
IgM (Immunoglobulin M), Srm: <5 mg/dL — ABNORMAL LOW (ref 20–172)
Total Protein ELP: 6.6 g/dL (ref 6.0–8.5)

## 2020-08-09 LAB — KAPPA/LAMBDA LIGHT CHAINS
Kappa free light chain: 44.5 mg/L — ABNORMAL HIGH (ref 3.3–19.4)
Kappa, lambda light chain ratio: 1.53 (ref 0.26–1.65)
Lambda free light chains: 29.1 mg/L — ABNORMAL HIGH (ref 5.7–26.3)

## 2020-08-09 LAB — COMPREHENSIVE METABOLIC PANEL
ALT: 21 U/L (ref 0–44)
AST: 21 U/L (ref 15–41)
Albumin: 3.6 g/dL (ref 3.5–5.0)
Alkaline Phosphatase: 54 U/L (ref 38–126)
Anion gap: 10 (ref 5–15)
BUN: 37 mg/dL — ABNORMAL HIGH (ref 8–23)
CO2: 20 mmol/L — ABNORMAL LOW (ref 22–32)
Calcium: 9.1 mg/dL (ref 8.9–10.3)
Chloride: 110 mmol/L (ref 98–111)
Creatinine, Ser: 1.84 mg/dL — ABNORMAL HIGH (ref 0.61–1.24)
GFR calc Af Amer: 44 mL/min — ABNORMAL LOW (ref >60)
GFR calc non Af Amer: 38 mL/min — ABNORMAL LOW (ref >60)
Glucose, Bld: 93 mg/dL (ref 70–99)
Potassium: 4.6 mmol/L (ref 3.5–5.1)
Sodium: 140 mmol/L (ref 135–145)
Total Bilirubin: 0.4 mg/dL (ref 0.3–1.2)
Total Protein: 6.9 g/dL (ref 6.5–8.1)

## 2020-08-09 NOTE — Progress Notes (Deleted)
Physician Discharge Summary  PERRIS CONWELL PPI:[id number] DOB: 1956-05-05 DOA: 08/07/2020  PCP: Andres Fernandez, No Pcp Per  Admit date: 08/07/2020 Discharge date: 08/09/2020  Time spent: 40 minutes  Recommendations for Outpatient Follow-up:  1. Needs Chem-12, CBC 1 week 2. Recommend outpatient discussion of resumption of Revlimid by Dr. Jenell Milliner who will be CCed on this note 3. 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 Derwood Kaplan Revlimid previously-now carfilzomib/pamidronate Renal insuff 2/2 above foll Dr. Louanna Raw baseline creatinine 1.7-2.2 Hep C Rx in the past Dr/ Linus Salmons Still smokes 3 cigarettes a day, prior cocaine EtOH  Developed cough cold symptoms 8/24-received Rx cancer treatment with infusion 8/27 Presented to [facility] ED T-max 102.2 RR 40 requiring 3 L tachycardic 120  Hospital Course:  1. Bronchitis secondary to RSV, lactic acidosis on admission a. Andres Fernandez RSV is positive I do not think he needs a VQ scan and have discontinued it b. Supportive management mainly at this time do not think there is evidence for palivizumab in adults c. He had completely resolved on discharge and will be given a work note d. His white count was likely secondary to steroids given he had no fever no chills and no further work-up is needed 2. Elevated BNP a. Likely secondary to bronchitis-no further work-up currently as he has no lower extremity swelling and no other signs or symptoms. 3. Plasma cell  myeloma- 4. Anemia of malignancy with mild thrombocytopenia 120 a. Monitor trends of blood counts over the next day or so b. Stable at this time other than mild elevation of white count c. Repeat labs outpatient 5. Hyperkalemia with metabolic acidosis in the setting of light chain disease, chronic kidney disease stage IIIa underlying a. Bump in creatinine from baseline of about 2 but got better during hospital stay b. Acidosis also improved c.  6. Hep C in the past a. Labs are stable outpatient HCVRNA 7. Still smoker     Discharge Exam: Vitals:   08/08/20 2019 08/09/20 0516  BP: (!) 148/93 (!) 161/94  Pulse: 68 63  Resp: 20 18  Temp: 98.2 F (36.8 C) 98.5 F (36.9 C)  SpO2: 100% 100%    General: Awake coherent no distress EOMI NCAT no focal deficit no icterus no pallor Cardiovascular: S1-S2 no murmur rub or gallop RRR Respiratory: Clear no added sound no rales no rhonchi Abdomen soft nontender no rebound or guarding  Discharge Instructions   Discharge Instructions    Diet - low sodium heart healthy   Complete by: As directed    Discharge instructions   Complete by: As directed    You had RSV something called respiratory syncytial virus which is usually found in a very young children and older people and can be quite debilitating for them-unfortunate because you have underlying immunocompromise state you probably got sick because of it but I think you resolved quickly and I do not think you will suffer any long-term consequences Make sure that you get labs in about  a week at your regular physician's office Hold your Revlimid until you follow-up with your oncologist and discuss with him implications of this You will need labs as discussed above I would recommend that you keep the mask on around your family member through the end of the week   Increase activity slowly   Complete by: As directed      Allergies as of 08/09/2020   No Known Allergies     Medication List     STOP taking these medications   dexamethasone 4 MG tablet Commonly known as: DECADRON   lenalidomide 10 MG capsule Commonly known as: REVLIMID     TAKE these medications   acyclovir 400 MG tablet Commonly known as: ZOVIRAX Take 1 tablet (400 mg total) by mouth daily. What changed: when to take this   aspirin EC 81 MG tablet Take 1 tablet (81 mg total) by mouth daily. What changed: when to take this   cholecalciferol 10 MCG (400 UNIT) Tabs tablet Commonly known as: VITAMIN D3 Take 400 Units by mouth at bedtime.   multivitamin with minerals tablet Take 1 tablet by mouth at bedtime.   nicotine 14 mg/24hr patch Commonly known as: NICODERM CQ - dosed in mg/24 hours PLACE 1 PATCH (14 MG TOTAL) ONTO THE SKIN DAILY. What changed:   how much to take  how to take this  when to take this  additional instructions   ondansetron 4 MG tablet Commonly known as: ZOFRAN Take 2 tablets (8 mg total) by mouth every 8 (eight) hours as needed for nausea.   tiZANidine 2 MG tablet Commonly known as: ZANAFLEX Take 1 tablet (2 mg total) by mouth every 8 (eight) hours as needed for muscle spasms.   Vitamin D (Ergocalciferol) 1.25 MG (50000 UNIT) Caps capsule Commonly known as: DRISDOL Take 1 capsule (50,000 Units total) by mouth once a week. What changed: when to take this      No Known Allergies    The results of significant diagnostics from this hospitalization (including imaging, microbiology, ancillary and laboratory) are listed below for reference.    Significant Diagnostic Studies: DG Chest Portable 1 View  Result Date: 08/07/2020 CLINICAL DATA:  Shortness of breath and chest pain. Per EMS, O2 sat on r/a 97%, but the pt was breathing at about 45-50 times per minute. Medical hx of cancer and HTN. EXAM: PORTABLE CHEST 1 VIEW COMPARISON:  Chest radiograph 02/27/2019 FINDINGS: Interval removal of a right central venous catheter. Stable cardiomediastinal contours with normal  heart size. The lungs are clear. No pneumothorax or large pleural effusion. No acute osseous abnormality. IMPRESSION: No acute cardiopulmonary process. Electronically Signed   By: Audie Pinto M.D.   On: 08/07/2020 12:04    Microbiology: Recent Results (from the past 240 hour(s))  SARS Coronavirus 2 by RT PCR (hospital order, performed in [facility] hospital lab) Nasopharyngeal Nasopharyngeal Swab     Status: None   Collection Time: 08/07/20 11:44 AM   Specimen: Nasopharyngeal Swab  Result Value Ref Range Status   SARS Coronavirus 2 NEGATIVE NEGATIVE Final    Comment: (NOTE) SARS-CoV-2 target nucleic acids are NOT DETECTED.  The SARS-CoV-2 RNA is generally detectable in upper and lower respiratory specimens during the acute phase of infection. The lowest concentration of SARS-CoV-2 viral copies this assay can detect is 250 copies / mL. A negative result does not preclude SARS-CoV-2 infection and should not be used as the sole basis for treatment or other Andres Fernandez management decisions.  A  negative result may occur with improper specimen collection / handling, submission of specimen other than nasopharyngeal swab, presence of viral mutation(s) within the areas targeted by this assay, and inadequate number of viral copies (<250 copies / mL). A negative result must be combined with clinical observations, Andres Fernandez history, and epidemiological information.  Fact Sheet for Patients:   StrictlyIdeas.no  Fact Sheet for Healthcare Providers: BankingDealers.co.za  This test is not yet approved or  cleared by the [place] FDA and has been authorized for detection and/or diagnosis of SARS-CoV-2 by FDA under an Emergency Use Authorization (EUA).  This EUA will remain in effect (meaning this test can be used) for the duration of the COVID-19 declaration under Section 564(b)(1) of the Act, 21 U.S.C. section 360bbb-3(b)(1), unless the  authorization is terminated or revoked sooner.  Performed at [facility], [place] [address], [place], [place] [address]   Blood culture (routine x 2)     Status: None (Preliminary result)   Collection Time: 08/07/20 11:44 AM   Specimen: BLOOD  Result Value Ref Range Status   Specimen Description   Final    BLOOD LEFT ANTECUBITAL Performed at [facility], [place] [address] [place], [place] [address]    Special Requests   Final    BOTTLES DRAWN AEROBIC AND ANAEROBIC Blood Culture adequate volume Performed at [place] [address] [place], [place] [address]    Culture   Final    NO GROWTH 2 DAYS Performed at [place] [address], [place], [place] [address]    Report Status PENDING  Incomplete  Blood culture (routine x 2)     Status: None (Preliminary result)   Collection Time: 08/07/20 11:44 AM   Specimen: BLOOD  Result Value Ref Range Status   Specimen Description   Final    BLOOD RIGHT ANTECUBITAL Performed at [facility], [place] [address], [place], [place] [address]    Special Requests   Final    BOTTLES DRAWN AEROBIC AND ANAEROBIC Blood Culture results may not be optimal due to an excessive volume of blood received in culture bottles Performed at [place] [address] [place], [place] [address]    Culture   Final    NO GROWTH 2 DAYS Performed at [place] [address] [place], [place] [address]    Report Status PENDING  Incomplete  Urine culture     Status: Abnormal   Collection Time: 08/07/20  1:14 PM   Specimen: Urine, Random  Result Value Ref Range Status   Specimen Description   Final    URINE, RANDOM Performed at [place] [address] [place], [place] [address]    Special Requests   Final    NONE Performed at [facility], [place] [address], [place], [place] [address]    Culture (A)  Final    <10,000  COLONIES/mL INSIGNIFICANT GROWTH Performed at [place] [address] [place], [place] [address]    Report Status 08/08/2020 FINAL  Final  Respiratory Panel by PCR     Status: Abnormal   Collection Time: 08/07/20  1:16 PM   Specimen: Nasopharyngeal Swab; Respiratory  Result Value Ref Range Status   Adenovirus NOT DETECTED NOT DETECTED Final   Coronavirus 229E NOT DETECTED NOT DETECTED Final    Comment: (NOTE) The Coronavirus on the Respiratory Panel, DOES NOT test for the novel  Coronavirus (  2019 nCoV)    Coronavirus HKU1 NOT DETECTED NOT DETECTED Final   Coronavirus NL63 NOT DETECTED NOT DETECTED Final   Coronavirus OC43 NOT DETECTED NOT DETECTED Final   Metapneumovirus NOT DETECTED NOT DETECTED Final   Rhinovirus / Enterovirus NOT DETECTED NOT DETECTED Final   Influenza A NOT DETECTED NOT DETECTED Final   Influenza B NOT DETECTED NOT DETECTED Final   Parainfluenza Virus 1 NOT DETECTED NOT DETECTED Final   Parainfluenza Virus 2 NOT DETECTED NOT DETECTED Final   Parainfluenza Virus 3 NOT DETECTED NOT DETECTED Final   Parainfluenza Virus 4 NOT DETECTED NOT DETECTED Final   Respiratory Syncytial Virus DETECTED (A) NOT DETECTED Final   Bordetella pertussis NOT DETECTED NOT DETECTED Final   Chlamydophila pneumoniae NOT DETECTED NOT DETECTED Final   Mycoplasma pneumoniae NOT DETECTED NOT DETECTED Final    Comment: Performed at [facility], [place] [address] [place], [place] [address]     Labs: Basic Metabolic Panel: Recent Labs  Lab 08/06/20 1009 08/07/20 1144 08/08/20 0550 08/09/20 0624  NA 139 143 141 140  K 4.1 4.2 5.0 4.6  CL 111 108 113* 110  CO2 18* 22 17* 20*  GLUCOSE 120* 115* 117* 93  BUN 26* 37* 42* 37*  CREATININE 1.92* 2.31* 1.98* 1.84*  CALCIUM 9.2 8.7* 8.5* 9.1   Liver Function Tests: Recent Labs  Lab 08/06/20 1009 08/07/20 1144 08/08/20 0550 08/09/20 0624  AST 12* 31 27 21   ALT 13 20 17 21   ALKPHOS 69 62 48 54  BILITOT 0.4 0.4 0.3  0.4  PROT 7.0 7.6 6.6 6.9  ALBUMIN 3.8 4.0 3.4* 3.6   No results for input(s): LIPASE, AMYLASE in the last 168 hours. No results for input(s): AMMONIA in the last 168 hours. CBC: Recent Labs  Lab 08/06/20 1009 08/07/20 1144 08/08/20 0550 08/09/20 0624  WBC 7.2 9.1 7.5 12.2*  NEUTROABS 5.1 7.1  --  8.7*  HGB 11.9* 12.3* 10.8* 12.3*  HCT 35.1* 36.4* 32.2* 37.0*  MCV 93.1 96.0 95.3 96.1  PLT 187 161 120* 118*   Cardiac Enzymes: No results for input(s): CKTOTAL, CKMB, CKMBINDEX, TROPONINI in the last 168 hours. BNP: BNP (last 3 results) Recent Labs    08/07/20 1144  BNP 129.6*    ProBNP (last 3 results) No results for input(s): PROBNP in the last 8760 hours.  CBG: No results for input(s): GLUCAP in the last 168 hours.     Signed:  Nita Sells MD   [facility] 08/09/2020, 8:50 AM

## 2020-08-09 NOTE — Discharge Summary (Signed)
Physician Discharge Summary  Andres Fernandez GGE:[id number] DOB: 1956-03-02 DOA: 08/07/2020  PCP: Patient, No Pcp Per  Admit date: 08/07/2020 Discharge date: 08/09/2020  Time spent: 40 minutes  Recommendations for Outpatient Follow-up:  1. Needs Chem-12, CBC 1 week 2. Recommend outpatient discussion of resumption of Revlimid by Dr. Jenell Milliner who will be CCed on this note 3. 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 Derwood Kaplan Revlimid previously-now carfilzomib/pamidronate Renal insuff 2/2 above foll Dr. Louanna Raw baseline creatinine 1.7-2.2 Hep C Rx in the past Dr/ Linus Salmons Still smokes 3 cigarettes a day, prior cocaine EtOH  Developed cough cold symptoms 8/24-received Rx cancer treatment with infusion 8/27 Presented to [facility] ED T-max 102.2 RR 40 requiring 3 L tachycardic 120  Hospital Course:  1. Bronchitis secondary to RSV, lactic acidosis on admission a. Patient RSV is positive I do not think he needs a VQ scan and have discontinued it b. Supportive management mainly at this time do not think there is evidence for palivizumab in adults c. He had completely resolved on discharge and will be given a work note d. His white count was likely secondary to steroids given he had no fever no chills and no further work-up is needed 2. Elevated BNP a. Likely secondary to bronchitis-no further work-up currently as he has no lower extremity swelling and no other signs or symptoms. 3. Plasma cell  myeloma- 4. Anemia of malignancy with mild thrombocytopenia 120 a. Monitor trends of blood counts over the next day or so b. Stable at this time other than mild elevation of white count c. Repeat labs outpatient 5. Hyperkalemia with metabolic acidosis in the setting of light chain disease, chronic kidney disease stage IIIa underlying a. Bump in creatinine from baseline of about 2 but got better during hospital stay b. Acidosis also improved c.  6. Hep C in the past a. Labs are stable outpatient HCVRNA 7. Still smoker     Discharge Exam: Vitals:   08/08/20 2019 08/09/20 0516  BP: (!) 148/93 (!) 161/94  Pulse: 68 63  Resp: 20 18  Temp: 98.2 F (36.8 C) 98.5 F (36.9 C)  SpO2: 100% 100%    General: Awake coherent no distress EOMI NCAT no focal deficit no icterus no pallor Cardiovascular: S1-S2 no murmur rub or gallop RRR Respiratory: Clear no added sound no rales no rhonchi Abdomen soft nontender no rebound or guarding  Discharge Instructions   Discharge Instructions    Diet - low sodium heart healthy   Complete by: As directed    Discharge instructions   Complete by: As directed    You had RSV something called respiratory syncytial virus which is usually found in a very young children and older people and can be quite debilitating for them-unfortunate because you have underlying immunocompromise state you probably got sick because of it but I think you resolved quickly and I do not think you will suffer any long-term consequences Make sure that you get labs in about  a week at your regular physician's office Hold your Revlimid until you follow-up with your oncologist and discuss with him implications of this You will need labs as discussed above I would recommend that you keep the mask on around your family member through the end of the week   Increase activity slowly   Complete by: As directed      Allergies as of 08/09/2020   No Known Allergies     Medication List     STOP taking these medications   dexamethasone 4 MG tablet Commonly known as: DECADRON   lenalidomide 10 MG capsule Commonly known as: REVLIMID     TAKE these medications   acyclovir 400 MG tablet Commonly known as: ZOVIRAX Take 1 tablet (400 mg total) by mouth daily. What changed: when to take this   aspirin EC 81 MG tablet Take 1 tablet (81 mg total) by mouth daily. What changed: when to take this   cholecalciferol 10 MCG (400 UNIT) Tabs tablet Commonly known as: VITAMIN D3 Take 400 Units by mouth at bedtime.   multivitamin with minerals tablet Take 1 tablet by mouth at bedtime.   nicotine 14 mg/24hr patch Commonly known as: NICODERM CQ - dosed in mg/24 hours PLACE 1 PATCH (14 MG TOTAL) ONTO THE SKIN DAILY. What changed:   how much to take  how to take this  when to take this  additional instructions   ondansetron 4 MG tablet Commonly known as: ZOFRAN Take 2 tablets (8 mg total) by mouth every 8 (eight) hours as needed for nausea.   tiZANidine 2 MG tablet Commonly known as: ZANAFLEX Take 1 tablet (2 mg total) by mouth every 8 (eight) hours as needed for muscle spasms.   Vitamin D (Ergocalciferol) 1.25 MG (50000 UNIT) Caps capsule Commonly known as: DRISDOL Take 1 capsule (50,000 Units total) by mouth once a week. What changed: when to take this      No Known Allergies    The results of significant diagnostics from this hospitalization (including imaging, microbiology, ancillary and laboratory) are listed below for reference.    Significant Diagnostic Studies: DG Chest Portable 1 View  Result Date: 08/07/2020 CLINICAL DATA:  Shortness of breath and chest pain. Per EMS, O2 sat on r/a 97%, but the pt was breathing at about 45-50 times per minute. Medical hx of cancer and HTN. EXAM: PORTABLE CHEST 1 VIEW COMPARISON:  Chest radiograph 02/27/2019 FINDINGS: Interval removal of a right central venous catheter. Stable cardiomediastinal contours with normal  heart size. The lungs are clear. No pneumothorax or large pleural effusion. No acute osseous abnormality. IMPRESSION: No acute cardiopulmonary process. Electronically Signed   By: Audie Pinto M.D.   On: 08/07/2020 12:04    Microbiology: Recent Results (from the past 240 hour(s))  SARS Coronavirus 2 by RT PCR (hospital order, performed in [facility] hospital lab) Nasopharyngeal Nasopharyngeal Swab     Status: None   Collection Time: 08/07/20 11:44 AM   Specimen: Nasopharyngeal Swab  Result Value Ref Range Status   SARS Coronavirus 2 NEGATIVE NEGATIVE Final    Comment: (NOTE) SARS-CoV-2 target nucleic acids are NOT DETECTED.  The SARS-CoV-2 RNA is generally detectable in upper and lower respiratory specimens during the acute phase of infection. The lowest concentration of SARS-CoV-2 viral copies this assay can detect is 250 copies / mL. A negative result does not preclude SARS-CoV-2 infection and should not be used as the sole basis for treatment or other patient management decisions.  A  negative result may occur with improper specimen collection / handling, submission of specimen other than nasopharyngeal swab, presence of viral mutation(s) within the areas targeted by this assay, and inadequate number of viral copies (<250 copies / mL). A negative result must be combined with clinical observations, patient history, and epidemiological information.  Fact Sheet for Patients:   StrictlyIdeas.no  Fact Sheet for Healthcare Providers: BankingDealers.co.za  This test is not yet approved or  cleared by the [place] FDA and has been authorized for detection and/or diagnosis of SARS-CoV-2 by FDA under an Emergency Use Authorization (EUA).  This EUA will remain in effect (meaning this test can be used) for the duration of the COVID-19 declaration under Section 564(b)(1) of the Act, 21 U.S.C. section 360bbb-3(b)(1), unless the  authorization is terminated or revoked sooner.  Performed at [facility], [place] [address] [place], [place] [address]   Blood culture (routine x 2)     Status: None (Preliminary result)   Collection Time: 08/07/20 11:44 AM   Specimen: BLOOD  Result Value Ref Range Status   Specimen Description   Final    BLOOD LEFT ANTECUBITAL Performed at [facility], [place] [address], [place], [place] [address]    Special Requests   Final    BOTTLES DRAWN AEROBIC AND ANAEROBIC Blood Culture adequate volume Performed at [place] [address] [place], [place] [address]    Culture   Final    NO GROWTH 2 DAYS Performed at [place] [address] [place], [place] [address]    Report Status PENDING  Incomplete  Blood culture (routine x 2)     Status: None (Preliminary result)   Collection Time: 08/07/20 11:44 AM   Specimen: BLOOD  Result Value Ref Range Status   Specimen Description   Final    BLOOD RIGHT ANTECUBITAL Performed at [facility], [place] [address], [place], [place] [address]    Special Requests   Final    BOTTLES DRAWN AEROBIC AND ANAEROBIC Blood Culture results may not be optimal due to an excessive volume of blood received in culture bottles Performed at [place] [address] [place], [place] [address]    Culture   Final    NO GROWTH 2 DAYS Performed at [place] [address], [place], [place] [address]    Report Status PENDING  Incomplete  Urine culture     Status: Abnormal   Collection Time: 08/07/20  1:14 PM   Specimen: Urine, Random  Result Value Ref Range Status   Specimen Description   Final    URINE, RANDOM Performed at [place] [address] [place], [place] [address]    Special Requests   Final    NONE Performed at [facility], [place] [address], [place], [place] [address]    Culture (A)  Final    <10,000  COLONIES/mL INSIGNIFICANT GROWTH Performed at [place] [address], [place],  [address]    Report Status 08/08/2020 FINAL  Final  Respiratory Panel by PCR     Status: Abnormal   Collection Time: 08/07/20  1:16 PM   Specimen: Nasopharyngeal Swab; Respiratory  Result Value Ref Range Status   Adenovirus NOT DETECTED NOT DETECTED Final   Coronavirus 229E NOT DETECTED NOT DETECTED Final    Comment: (NOTE) The Coronavirus on the Respiratory Panel, DOES NOT test for the novel  Coronavirus (  2019 nCoV)    Coronavirus HKU1 NOT DETECTED NOT DETECTED Final   Coronavirus NL63 NOT DETECTED NOT DETECTED Final   Coronavirus OC43 NOT DETECTED NOT DETECTED Final   Metapneumovirus NOT DETECTED NOT DETECTED Final   Rhinovirus / Enterovirus NOT DETECTED NOT DETECTED Final   Influenza A NOT DETECTED NOT DETECTED Final   Influenza B NOT DETECTED NOT DETECTED Final   Parainfluenza Virus 1 NOT DETECTED NOT DETECTED Final   Parainfluenza Virus 2 NOT DETECTED NOT DETECTED Final   Parainfluenza Virus 3 NOT DETECTED NOT DETECTED Final   Parainfluenza Virus 4 NOT DETECTED NOT DETECTED Final   Respiratory Syncytial Virus DETECTED (A) NOT DETECTED Final   Bordetella pertussis NOT DETECTED NOT DETECTED Final   Chlamydophila pneumoniae NOT DETECTED NOT DETECTED Final   Mycoplasma pneumoniae NOT DETECTED NOT DETECTED Final    Comment: Performed at [facility], [place] [address] [place], [place] [address]     Labs: Basic Metabolic Panel: Recent Labs  Lab 08/06/20 1009 08/07/20 1144 08/08/20 0550 08/09/20 0624  NA 139 143 141 140  K 4.1 4.2 5.0 4.6  CL 111 108 113* 110  CO2 18* 22 17* 20*  GLUCOSE 120* 115* 117* 93  BUN 26* 37* 42* 37*  CREATININE 1.92* 2.31* 1.98* 1.84*  CALCIUM 9.2 8.7* 8.5* 9.1   Liver Function Tests: Recent Labs  Lab 08/06/20 1009 08/07/20 1144 08/08/20 0550 08/09/20 0624  AST 12* 31 27 21   ALT 13 20 17 21   ALKPHOS 69 62 48 54  BILITOT 0.4 0.4 0.3  0.4  PROT 7.0 7.6 6.6 6.9  ALBUMIN 3.8 4.0 3.4* 3.6   No results for input(s): LIPASE, AMYLASE in the last 168 hours. No results for input(s): AMMONIA in the last 168 hours. CBC: Recent Labs  Lab 08/06/20 1009 08/07/20 1144 08/08/20 0550 08/09/20 0624  WBC 7.2 9.1 7.5 12.2*  NEUTROABS 5.1 7.1  --  8.7*  HGB 11.9* 12.3* 10.8* 12.3*  HCT 35.1* 36.4* 32.2* 37.0*  MCV 93.1 96.0 95.3 96.1  PLT 187 161 120* 118*   Cardiac Enzymes: No results for input(s): CKTOTAL, CKMB, CKMBINDEX, TROPONINI in the last 168 hours. BNP: BNP (last 3 results) Recent Labs    08/07/20 1144  BNP 129.6*    ProBNP (last 3 results) No results for input(s): PROBNP in the last 8760 hours.  CBG: No results for input(s): GLUCAP in the last 168 hours.     Signed:  Nita Sells MD   [facility] 08/09/2020, 8:53 AM

## 2020-08-09 NOTE — Plan of Care (Signed)

## 2020-08-10 ENCOUNTER — Telehealth: Payer: Self-pay | Admitting: *Deleted

## 2020-08-10 NOTE — Telephone Encounter (Signed)
Patient was in hospital 8/28 to 8/30. Not Covid, diagnosed with RSV. MD advised him on discharge to find out from [facility] when resume Revlimid. (MD states he copied you on the note). Dr/ kale unformed.  Per Dr.Kale:  Hold Revlimid for at least 2 weeks after resolution of respiratory symptoms and fevers. Contacted patient with Dr.Kale's directions - patient verbalized understanding.

## 2020-08-12 LAB — CULTURE, BLOOD (ROUTINE X 2)
Culture: NO GROWTH
Culture: NO GROWTH
Special Requests: ADEQUATE

## 2020-08-17 ENCOUNTER — Telehealth: Payer: Self-pay | Admitting: Hematology

## 2020-08-17 NOTE — Telephone Encounter (Signed)
Scheduled per 08/27 los, patient has been called and notified.

## 2020-08-19 ENCOUNTER — Other Ambulatory Visit: Payer: Self-pay | Admitting: *Deleted

## 2020-08-19 DIAGNOSIS — C9010 Plasma cell leukemia not having achieved remission: Secondary | ICD-10-CM

## 2020-08-19 MED ORDER — LENALIDOMIDE 10 MG PO CAPS: 10.0000 mg | ORAL_CAPSULE | Freq: Every day | ORAL | 0 refills | Status: DC

## 2020-08-19 NOTE — Telephone Encounter (Signed)
Recieved faxed refill refill request for Revlimid  Refill escribed to [facility], [place] - [address]  [employer] auth # [id number], 08/19/20

## 2020-08-20 ENCOUNTER — Inpatient Hospital Stay: Payer: BC Managed Care – PPO | Attending: Hematology

## 2020-08-20 ENCOUNTER — Other Ambulatory Visit: Payer: BC Managed Care – PPO

## 2020-08-20 ENCOUNTER — Other Ambulatory Visit: Payer: Self-pay

## 2020-08-20 ENCOUNTER — Inpatient Hospital Stay: Payer: BC Managed Care – PPO

## 2020-08-20 VITALS — BP 124/77 | HR 71 | Temp 98.2°F | Resp 17 | Wt 152.0 lb

## 2020-08-20 DIAGNOSIS — Z23 Encounter for immunization: Secondary | ICD-10-CM | POA: Insufficient documentation

## 2020-08-20 DIAGNOSIS — Z79899 Other long term (current) drug therapy: Secondary | ICD-10-CM | POA: Insufficient documentation

## 2020-08-20 DIAGNOSIS — Z5111 Encounter for antineoplastic chemotherapy: Secondary | ICD-10-CM | POA: Insufficient documentation

## 2020-08-20 DIAGNOSIS — C9010 Plasma cell leukemia not having achieved remission: Secondary | ICD-10-CM | POA: Diagnosis present

## 2020-08-20 DIAGNOSIS — R682 Dry mouth, unspecified: Secondary | ICD-10-CM | POA: Diagnosis not present

## 2020-08-20 DIAGNOSIS — Z7189 Other specified counseling: Secondary | ICD-10-CM

## 2020-08-20 DIAGNOSIS — C9000 Multiple myeloma not having achieved remission: Secondary | ICD-10-CM | POA: Diagnosis not present

## 2020-08-20 DIAGNOSIS — M79605 Pain in left leg: Secondary | ICD-10-CM | POA: Insufficient documentation

## 2020-08-20 DIAGNOSIS — N400 Enlarged prostate without lower urinary tract symptoms: Secondary | ICD-10-CM | POA: Insufficient documentation

## 2020-08-20 DIAGNOSIS — I10 Essential (primary) hypertension: Secondary | ICD-10-CM | POA: Insufficient documentation

## 2020-08-20 DIAGNOSIS — B1920 Unspecified viral hepatitis C without hepatic coma: Secondary | ICD-10-CM | POA: Insufficient documentation

## 2020-08-20 DIAGNOSIS — F17210 Nicotine dependence, cigarettes, uncomplicated: Secondary | ICD-10-CM | POA: Insufficient documentation

## 2020-08-20 DIAGNOSIS — R42 Dizziness and giddiness: Secondary | ICD-10-CM | POA: Diagnosis not present

## 2020-08-20 DIAGNOSIS — M84551A Pathological fracture in neoplastic disease, right femur, initial encounter for fracture: Secondary | ICD-10-CM

## 2020-08-20 LAB — CBC WITH DIFFERENTIAL/PLATELET
Abs Immature Granulocytes: 0.03 10*3/uL (ref 0.00–0.07)
Basophils Absolute: 0.1 10*3/uL (ref 0.0–0.1)
Basophils Relative: 2 %
Eosinophils Absolute: 0.1 10*3/uL (ref 0.0–0.5)
Eosinophils Relative: 2 %
HCT: 35.4 % — ABNORMAL LOW (ref 39.0–52.0)
Hemoglobin: 12.2 g/dL — ABNORMAL LOW (ref 13.0–17.0)
Immature Granulocytes: 0 %
Lymphocytes Relative: 26 %
Lymphs Abs: 1.8 10*3/uL (ref 0.7–4.0)
MCH: 31.8 pg (ref 26.0–34.0)
MCHC: 34.5 g/dL (ref 30.0–36.0)
MCV: 92.2 fL (ref 80.0–100.0)
Monocytes Absolute: 0.3 10*3/uL (ref 0.1–1.0)
Monocytes Relative: 4 %
Neutro Abs: 4.5 10*3/uL (ref 1.7–7.7)
Neutrophils Relative %: 66 %
Platelets: 255 10*3/uL (ref 150–400)
RBC: 3.84 MIL/uL — ABNORMAL LOW (ref 4.22–5.81)
RDW: 15.8 % — ABNORMAL HIGH (ref 11.5–15.5)
WBC: 6.9 10*3/uL (ref 4.0–10.5)
nRBC: 0 % (ref 0.0–0.2)

## 2020-08-20 LAB — CMP (CANCER CENTER ONLY)
ALT: 14 U/L (ref 0–44)
AST: 13 U/L — ABNORMAL LOW (ref 15–41)
Albumin: 3.8 g/dL (ref 3.5–5.0)
Alkaline Phosphatase: 68 U/L (ref 38–126)
Anion gap: 12 (ref 5–15)
BUN: 35 mg/dL — ABNORMAL HIGH (ref 8–23)
CO2: 18 mmol/L — ABNORMAL LOW (ref 22–32)
Calcium: 9.5 mg/dL (ref 8.9–10.3)
Chloride: 113 mmol/L — ABNORMAL HIGH (ref 98–111)
Creatinine: 1.78 mg/dL — ABNORMAL HIGH (ref 0.61–1.24)
GFR, Est AFR Am: 46 mL/min — ABNORMAL LOW (ref >60)
GFR, Estimated: 40 mL/min — ABNORMAL LOW (ref >60)
Glucose, Bld: 113 mg/dL — ABNORMAL HIGH (ref 70–99)
Potassium: 4.4 mmol/L (ref 3.5–5.1)
Sodium: 143 mmol/L (ref 135–145)
Total Bilirubin: 0.4 mg/dL (ref 0.3–1.2)
Total Protein: 7.6 g/dL (ref 6.5–8.1)

## 2020-08-20 MED ORDER — SODIUM CHLORIDE 0.9 % IV SOLN
Freq: Once | INTRAVENOUS | Status: AC
  Filled 2020-08-20: qty 250

## 2020-08-20 MED ORDER — ACETAMINOPHEN 325 MG PO TABS
650.0000 mg | ORAL_TABLET | Freq: Once | ORAL | Status: AC
  Administered 2020-08-20: 650 mg via ORAL

## 2020-08-20 MED ORDER — ACETAMINOPHEN 325 MG PO TABS
ORAL_TABLET | ORAL | Status: AC
  Filled 2020-08-20: qty 2

## 2020-08-20 MED ORDER — SODIUM CHLORIDE 0.9 % IV SOLN
60.0000 mg | Freq: Once | INTRAVENOUS | Status: DC
  Filled 2020-08-20: qty 20

## 2020-08-20 MED ORDER — SODIUM CHLORIDE 0.9 % IV SOLN
60.0000 mg | Freq: Once | INTRAVENOUS | Status: AC
  Administered 2020-08-20: 60 mg via INTRAVENOUS
  Filled 2020-08-20: qty 10

## 2020-08-20 MED ORDER — DEXTROSE 5 % IV SOLN
56.0000 mg/m2 | Freq: Once | INTRAVENOUS | Status: AC
  Administered 2020-08-20: 100 mg via INTRAVENOUS
  Filled 2020-08-20: qty 15

## 2020-08-20 MED ORDER — FAMOTIDINE 20 MG PO TABS
ORAL_TABLET | ORAL | Status: AC
  Filled 2020-08-20: qty 1

## 2020-08-20 MED ORDER — SODIUM CHLORIDE 0.9 % IV SOLN
INTRAVENOUS | Status: AC
  Filled 2020-08-20: qty 250

## 2020-08-20 MED ORDER — SODIUM CHLORIDE 0.9 % IV SOLN
20.0000 mg | Freq: Once | INTRAVENOUS | Status: AC
  Administered 2020-08-20: 20 mg via INTRAVENOUS
  Filled 2020-08-20: qty 20

## 2020-08-20 MED ORDER — FAMOTIDINE 20 MG PO TABS
20.0000 mg | ORAL_TABLET | Freq: Once | ORAL | Status: AC
  Administered 2020-08-20: 20 mg via ORAL

## 2020-08-20 NOTE — Patient Instructions (Signed)
[facility] Discharge Instructions for Patients Receiving Chemotherapy  Today you received the following chemotherapy agents:  Kyprolis  To help prevent nausea and vomiting after your treatment, we encourage you to take your nausea medication as directed.   If you develop nausea and vomiting that is not controlled by your nausea medication, call the clinic.   BELOW ARE SYMPTOMS THAT SHOULD BE REPORTED IMMEDIATELY:  *FEVER GREATER THAN 100.5 F  *CHILLS WITH OR WITHOUT FEVER  NAUSEA AND VOMITING THAT IS NOT CONTROLLED WITH YOUR NAUSEA MEDICATION  *UNUSUAL SHORTNESS OF BREATH  *UNUSUAL BRUISING OR BLEEDING  TENDERNESS IN MOUTH AND THROAT WITH OR WITHOUT PRESENCE OF ULCERS  *URINARY PROBLEMS  *BOWEL PROBLEMS  UNUSUAL RASH Items with * indicate a potential emergency and should be followed up as soon as possible.  Feel free to call the clinic should you have any questions or concerns. The clinic phone number is [phone].  Please show the CHEMO ALERT CARD at check-in to the Emergency Department and triage nurse.   

## 2020-08-20 NOTE — Progress Notes (Signed)
Verbal order from Dr. Irene Limbo - Madaline Brilliant to receive [id number] Kyprolis with Creatine 1.78.

## 2020-09-03 ENCOUNTER — Inpatient Hospital Stay: Payer: BC Managed Care – PPO

## 2020-09-03 ENCOUNTER — Other Ambulatory Visit: Payer: Self-pay

## 2020-09-03 ENCOUNTER — Ambulatory Visit: Payer: BC Managed Care – PPO

## 2020-09-03 ENCOUNTER — Other Ambulatory Visit: Payer: BC Managed Care – PPO

## 2020-09-03 ENCOUNTER — Inpatient Hospital Stay (HOSPITAL_BASED_OUTPATIENT_CLINIC_OR_DEPARTMENT_OTHER): Payer: BC Managed Care – PPO | Admitting: Hematology

## 2020-09-03 VITALS — BP 133/83 | HR 72 | Temp 97.9°F | Resp 18 | Ht 68.0 in | Wt 157.2 lb

## 2020-09-03 DIAGNOSIS — C9010 Plasma cell leukemia not having achieved remission: Secondary | ICD-10-CM

## 2020-09-03 DIAGNOSIS — Z5111 Encounter for antineoplastic chemotherapy: Secondary | ICD-10-CM

## 2020-09-03 DIAGNOSIS — Z23 Encounter for immunization: Secondary | ICD-10-CM

## 2020-09-03 DIAGNOSIS — Z7189 Other specified counseling: Secondary | ICD-10-CM

## 2020-09-03 DIAGNOSIS — C9000 Multiple myeloma not having achieved remission: Secondary | ICD-10-CM | POA: Diagnosis not present

## 2020-09-03 LAB — CBC WITH DIFFERENTIAL/PLATELET
Abs Immature Granulocytes: 0.02 10*3/uL (ref 0.00–0.07)
Basophils Absolute: 0.1 10*3/uL (ref 0.0–0.1)
Basophils Relative: 1 %
Eosinophils Absolute: 0.1 10*3/uL (ref 0.0–0.5)
Eosinophils Relative: 2 %
HCT: 37 % — ABNORMAL LOW (ref 39.0–52.0)
Hemoglobin: 12.5 g/dL — ABNORMAL LOW (ref 13.0–17.0)
Immature Granulocytes: 0 %
Lymphocytes Relative: 24 %
Lymphs Abs: 1.4 10*3/uL (ref 0.7–4.0)
MCH: 32.6 pg (ref 26.0–34.0)
MCHC: 33.8 g/dL (ref 30.0–36.0)
MCV: 96.6 fL (ref 80.0–100.0)
Monocytes Absolute: 0.6 10*3/uL (ref 0.1–1.0)
Monocytes Relative: 11 %
Neutro Abs: 3.5 10*3/uL (ref 1.7–7.7)
Neutrophils Relative %: 62 %
Platelets: 190 10*3/uL (ref 150–400)
RBC: 3.83 MIL/uL — ABNORMAL LOW (ref 4.22–5.81)
RDW: 17.1 % — ABNORMAL HIGH (ref 11.5–15.5)
WBC: 5.7 10*3/uL (ref 4.0–10.5)
nRBC: 0 % (ref 0.0–0.2)

## 2020-09-03 LAB — CMP (CANCER CENTER ONLY)
ALT: 14 U/L (ref 0–44)
AST: 14 U/L — ABNORMAL LOW (ref 15–41)
Albumin: 3.7 g/dL (ref 3.5–5.0)
Alkaline Phosphatase: 67 U/L (ref 38–126)
Anion gap: 8 (ref 5–15)
BUN: 23 mg/dL (ref 8–23)
CO2: 24 mmol/L (ref 22–32)
Calcium: 8.7 mg/dL — ABNORMAL LOW (ref 8.9–10.3)
Chloride: 111 mmol/L (ref 98–111)
Creatinine: 1.58 mg/dL — ABNORMAL HIGH (ref 0.61–1.24)
GFR, Est AFR Am: 53 mL/min — ABNORMAL LOW (ref >60)
GFR, Estimated: 46 mL/min — ABNORMAL LOW (ref >60)
Glucose, Bld: 87 mg/dL (ref 70–99)
Potassium: 4.1 mmol/L (ref 3.5–5.1)
Sodium: 143 mmol/L (ref 135–145)
Total Bilirubin: 0.7 mg/dL (ref 0.3–1.2)
Total Protein: 7.1 g/dL (ref 6.5–8.1)

## 2020-09-03 MED ORDER — ACETAMINOPHEN 325 MG PO TABS
ORAL_TABLET | ORAL | Status: AC
  Filled 2020-09-03: qty 2

## 2020-09-03 MED ORDER — INFLUENZA VAC SPLIT QUAD 0.5 ML IM SUSY
PREFILLED_SYRINGE | INTRAMUSCULAR | Status: AC
  Filled 2020-09-03: qty 0.5

## 2020-09-03 MED ORDER — SODIUM CHLORIDE 0.9 % IV SOLN
Freq: Once | INTRAVENOUS | Status: AC
  Filled 2020-09-03: qty 250

## 2020-09-03 MED ORDER — INFLUENZA VAC SPLIT QUAD 0.5 ML IM SUSY
0.5000 mL | PREFILLED_SYRINGE | Freq: Once | INTRAMUSCULAR | Status: AC
  Administered 2020-09-03: 0.5 mL via INTRAMUSCULAR

## 2020-09-03 MED ORDER — DEXTROSE 5 % IV SOLN
56.0000 mg/m2 | Freq: Once | INTRAVENOUS | Status: AC
  Administered 2020-09-03: 100 mg via INTRAVENOUS
  Filled 2020-09-03: qty 30

## 2020-09-03 MED ORDER — ACETAMINOPHEN 325 MG PO TABS
650.0000 mg | ORAL_TABLET | Freq: Once | ORAL | Status: AC
  Administered 2020-09-03: 650 mg via ORAL

## 2020-09-03 MED ORDER — SODIUM CHLORIDE 0.9 % IV SOLN
INTRAVENOUS | Status: AC
  Filled 2020-09-03: qty 250

## 2020-09-03 MED ORDER — FAMOTIDINE 20 MG PO TABS
20.0000 mg | ORAL_TABLET | Freq: Once | ORAL | Status: AC
  Administered 2020-09-03: 20 mg via ORAL

## 2020-09-03 MED ORDER — SODIUM CHLORIDE 0.9 % IV SOLN
20.0000 mg | Freq: Once | INTRAVENOUS | Status: AC
  Administered 2020-09-03: 20 mg via INTRAVENOUS
  Filled 2020-09-03: qty 20

## 2020-09-03 MED ORDER — FAMOTIDINE 20 MG PO TABS
ORAL_TABLET | ORAL | Status: AC
  Filled 2020-09-03: qty 1

## 2020-09-03 NOTE — Progress Notes (Signed)
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 His labs today 12/14/2017 show hgb 8.3, platelets 524k. He is doing well overall. He has staples in his right upper leg following a recent surgery and has not been in touch with the surgeons for a f/u of this. He is not receiving physical therapy and is not getting around at home.  He notes that he was not scheduled for a post hospitalization nephrology f/u and has no PCP.  On review of systems, pt reports intermittent fatigue, SOB, pain to the left leg, dizziness, dry mouth and denies fever, chills, night sweats, dysuria and any other accompanying symptoms.   INTERVAL HISTORY:   Andres Fernandez presents today for follow up and continue management of his myeloma/plasma cell leukemia. The patient's last visit with [name] was on 08/06/2020. The pt reports that he is doing well overall.  The pt reports that he is recovering from his RSV infection well. His breathing is now back to baseline. He is unsure where he picked up the virus. Pt has an appointment on October 6 at [facility] for immunizations.   Lab results today (09/03/20) of CBC w/diff and CMP is as follows: all values are WNL except for RBC at 3.83, Hgb at 12.5, HCT at 37.0, RDW at 17.1, Creatinine at 1.58, Calcium at 8.7, AST at 14, GFR Est Afr Am at 53.  On review of systems, pt denies fevers, chills, night sweats, SOB and any other symptoms.   REVIEW OF SYSTEMS:   A 10+ POINT REVIEW OF SYSTEMS WAS OBTAINED including neurology, dermatology, psychiatry, cardiac, respiratory, lymph, extremities, GI, GU, Musculoskeletal, constitutional, breasts, reproductive, HEENT.  All pertinent positives are noted in the HPI.  All others are negative.   MEDICAL  HISTORY:  Past Medical History:  Diagnosis Date  . BPH (benign prostatic hyperplasia)   . Hepatitis C 11/27/2017  . Hypertension   . Plasma cell leukemia ([place]) 11/23/2017  . Tobacco abuse     SURGICAL HISTORY: Past Surgical History:  Procedure Laterality Date  . APPENDECTOMY    . FEMUR IM NAIL Right 11/20/2017   Procedure: RIGHT FEMORAL INTRAMEDULLARY (IM) NAIL;  Surgeon: Nicholes Stairs, MD;  Location: [place];  Service: Orthopedics;  Laterality: Right;    SOCIAL HISTORY: Social History   Socioeconomic History  . Marital status: Single    Spouse name: Not on file  . Number of children: Not on file  . Years of education: Not on file  . Highest education level: Not on file  Occupational History  . Not on file  Tobacco Use  . Smoking status: Current Every Day Smoker    Packs/day: 0.25    Types: Cigarettes    Last attempt to quit: 11/09/2017    Years since quitting: 2.8  . Smokeless tobacco: Never Used  . Tobacco comment: 1 cigarette/day as of 09/25/18  Vaping Use  . Vaping Use: Never used  Substance and Sexual Activity  . Alcohol use: Yes  . Drug use: No  . Sexual activity: Not on file  Other Topics Concern  . Not on file  Social History Narrative  . Not on file   Social Determinants of Health   Financial Resource Strain:   . Difficulty of Paying Living  Expenses: Not on file  Food Insecurity:   . Worried About Charity fundraiser in the Last Year: Not on file  . Ran Out of Food in the Last Year: Not on file  Transportation Needs:   . Lack of Transportation (Medical): Not on file  . Lack of Transportation (Non-Medical): Not on file  Physical Activity:   . Days of Exercise per Week: Not on file  . Minutes of Exercise per Session: Not on file  Stress:   . Feeling of Stress : Not on file  Social Connections:   . Frequency of Communication with Friends and Family: Not on file  . Frequency of Social Gatherings with Friends and Family: Not on file  .  Attends Religious Services: Not on file  . Active Member of Clubs or Organizations: Not on file  . Attends Archivist Meetings: Not on file  . Marital Status: Not on file  Intimate Partner Violence:   . Fear of Current or Ex-Partner: Not on file  . Emotionally Abused: Not on file  . Physically Abused: Not on file  . Sexually Abused: Not on file    FAMILY HISTORY: Family History  Problem Relation Age of Onset  . COPD Mother   . Liver cancer Mother     ALLERGIES:  has No Known Allergies.  MEDICATIONS:  . Current Outpatient Medications on File Prior to Visit  Medication Sig Dispense Refill  . acyclovir (ZOVIRAX) 400 MG tablet Take 1 tablet (400 mg total) by mouth daily. (Patient taking differently: Take 400 mg by mouth at bedtime. ) 30 tablet 11  . aspirin EC 81 MG tablet Take 1 tablet (81 mg total) by mouth daily. (Patient taking differently: Take 81 mg by mouth at bedtime. ) 60 tablet 11  . cholecalciferol (VITAMIN D) 400 units TABS tablet Take 400 Units by mouth at bedtime.     [name] lenalidomide (REVLIMID) 10 MG capsule Take 1 capsule (10 mg total) by mouth daily. Take for 21 days on, 7 days off, repeat every 28 days. 21 capsule 0  . Multiple Vitamins-Minerals (MULTIVITAMIN WITH MINERALS) tablet Take 1 tablet by mouth at bedtime.     . nicotine (NICODERM CQ - DOSED IN MG/24 HOURS) 14 mg/24hr patch PLACE 1 PATCH (14 MG TOTAL) ONTO THE SKIN DAILY. (Patient taking differently: Place 14 mg onto the skin daily. ) 28 patch 1  . ondansetron (ZOFRAN) 4 MG tablet Take 2 tablets (8 mg total) by mouth every 8 (eight) hours as needed for nausea. 30 tablet 0  . tiZANidine (ZANAFLEX) 2 MG tablet Take 1 tablet (2 mg total) by mouth every 8 (eight) hours as needed for muscle spasms. 30 tablet 0  . Vitamin D, Ergocalciferol, (DRISDOL) 1.25 MG (50000 UNIT) CAPS capsule Take 1 capsule (50,000 Units total) by mouth once a week. (Patient taking differently: Take 50,000 Units by mouth every  Thursday. ) 12 capsule 6   No current facility-administered medications on file prior to visit.    PHYSICAL EXAMINATION:  ECOG FS:1 - Symptomatic but completely ambulatory  Vitals:   09/03/20 1039  BP: 133/83  Pulse: 72  Resp: 18  Temp: 97.9 F (36.6 C)  SpO2: 100%   Wt Readings from Last 3 Encounters:  09/03/20 157 lb 3.2 oz (71.3 kg)  08/20/20 152 lb (68.9 kg)  08/07/20 154 lb 5.2 oz (70 kg)   Body mass index is 23.9 kg/m.    GENERAL:alert, in no acute distress and comfortable SKIN: no  acute rashes, no significant lesions EYES: conjunctiva are pink and non-injected, sclera anicteric OROPHARYNX: MMM, no exudates, no oropharyngeal erythema or ulceration NECK: supple, no JVD LYMPH:  no palpable lymphadenopathy in the cervical, axillary or inguinal regions LUNGS: clear to auscultation b/l with normal respiratory effort HEART: regular rate & rhythm ABDOMEN:  normoactive bowel sounds , non tender, not distended. No palpable hepatosplenomegaly.  Extremity: no pedal edema PSYCH: alert & oriented x 3 with fluent speech NEURO: no focal motor/sensory deficits  LABORATORY DATA:  I have reviewed the data as listed  . CBC Latest Ref Rng & Units 09/03/2020 08/20/2020 08/09/2020  WBC 4.0 - 10.5 K/uL 5.7 6.9 12.2(H)  Hemoglobin 13.0 - 17.0 g/dL 12.5(L) 12.2(L) 12.3(L)  Hematocrit 39 - 52 % 37.0(L) 35.4(L) 37.0(L)  Platelets 150 - 400 K/uL 190 255 118(L)   . CBC    Component Value Date/Time   WBC 5.7 09/03/2020 1016   RBC 3.83 (L) 09/03/2020 1016   HGB 12.5 (L) 09/03/2020 1016   HGB 11.9 (L) 08/27/2019 0922   HGB 8.3 (L) 12/14/2017 1230   HCT 37.0 (L) 09/03/2020 1016   HCT 23.9 (L) 12/14/2017 1230   PLT 190 09/03/2020 1016   PLT 203 08/27/2019 0922   PLT 524 (H) 12/14/2017 1230   MCV 96.6 09/03/2020 1016   MCV 88.4 12/14/2017 1230   MCH 32.6 09/03/2020 1016   MCHC 33.8 09/03/2020 1016   RDW 17.1 (H) 09/03/2020 1016   RDW 15.3 (H) 12/14/2017 1230   LYMPHSABS 1.4  09/03/2020 1016   LYMPHSABS 1.4 12/14/2017 1230   MONOABS 0.6 09/03/2020 1016   MONOABS 0.6 12/14/2017 1230   EOSABS 0.1 09/03/2020 1016   EOSABS 0.1 12/14/2017 1230   BASOSABS 0.1 09/03/2020 1016   BASOSABS 0.1 12/14/2017 1230     . CMP Latest Ref Rng & Units 09/03/2020 08/20/2020 08/09/2020  Glucose 70 - 99 mg/dL 87 113(H) 93  BUN 8 - 23 mg/dL 23 35(H) 37(H)  Creatinine 0.61 - 1.24 mg/dL 1.58(H) 1.78(H) 1.84(H)  Sodium 135 - 145 mmol/L 143 143 140  Potassium 3.5 - 5.1 mmol/L 4.1 4.4 4.6  Chloride 98 - 111 mmol/L 111 113(H) 110  CO2 22 - 32 mmol/L 24 18(L) 20(L)  Calcium 8.9 - 10.3 mg/dL 8.7(L) 9.5 9.1  Total Protein 6.5 - 8.1 g/dL 7.1 7.6 6.9  Total Bilirubin 0.3 - 1.2 mg/dL 0.7 0.4 0.4  Alkaline Phos 38 - 126 U/L 67 68 54  AST 15 - 41 U/L 14(L) 13(L) 21  ALT 0 - 44 U/L 14 14 21    03/09/20 ([id number]) Bone Marrow Biopsy at [facility]   07/02/2019 Bone Marrow Biopsy ([id number]) at [facility]    08/07/18 BM Bx:       RADIOGRAPHIC STUDIES: I have personally reviewed the radiological images as listed and agreed with the findings in the report. DG Chest Portable 1 View  Result Date: 08/07/2020 CLINICAL DATA:  Shortness of breath and chest pain. Per EMS, O2 sat on r/a 97%, but the pt was breathing at about 45-50 times per minute. Medical hx of cancer and HTN. EXAM: PORTABLE CHEST 1 VIEW COMPARISON:  Chest radiograph 02/27/2019 FINDINGS: Interval removal of a right central venous catheter. Stable cardiomediastinal contours with normal heart size. The lungs are clear. No pneumothorax or large pleural effusion. No acute osseous abnormality. IMPRESSION: No acute cardiopulmonary process. Electronically Signed   By: Audie Pinto M.D.   On: 08/07/2020 12:04    ASSESSMENT & PLAN:  64 y.o. 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   Initial BM Bx showed >95% involvement with kappa restricted serum free light chains. 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 MoL Cy-   08/07/18 BM Bx revealed normocellular bone marrow with trilineage hematopoiesis and 1% plasma cells which indicates the pt is in remission 08/06/18 PET/CT revealed  Expansile lytic lesion involving the left iliac bone, unchanged. Lytic lesion involving the sternum, unchanged. While both are suspicious for myeloma, neither lesion is FDG avid. No hypermetabolic osseous lesions in the visualized axial and appendicular skeleton. Please note that the bilateral lower extremities were not imaged. No suspicious lymphadenopathy. Spleen is normal in size.    Pt began Ninlaro 13m maintenance after pt finished 13 cycles of CyBorD and BM bx showed only 1% plasma cells, then resumed CyBorD and held Ninlaro in anticipation of his tandem bone marrow [facility]plants [facility] in late December 2019 [facility]  11/19/18 High dose chemotherapy with Melphalan 1456mm2 and autologous stem cell rescue  03/20/19 High dose chemotherapy with Melphalan 20013m2 and autologous stem cell rescue. Non-infectious diarrhea, uncomplicated course otherwise.  07/02/2019 PET scan with results revealing "1. No FDG avid bone lesions are identified. 2.  Asymmetric uptake within the right, greater than left, palatine tonsils with mild fullness of the right tonsillar soft tissue. ENT consult vs attention on follow up suggested. 3.  Innumerable mixed sclerotic and lytic lesions throughout the axial and appendicular skeleton without associated hypermetabolic activity. 4.  Ancillary CT findings as above."  07/02/2019 bone marrow biopsy ([id number])with results showing: "Trilineage hematopoieses with maturation. No plasma  cell myeloma identified. Mild anemia and No Rouleaux or circulating plasma cells identified in the peripheral blood."  2) s/p Prophylactic IM nailing for rt femur  [facility] previously reported improvement and is currently progressively back to full time work.  3) Thrombocytopenia resolved   4) Renal insufficiency -- likely primarily related to Myeloma. stable Plan -maintain follow up with nephrology referral to Dr ColMarval RegalaNarda Amberdney for continued treatment -Pt completed 8 weeks of maverick for hep c with Dr ComLinus Salmons 07/08/18.  -Kidney numbers improved. Creatinine in the 1.7-2.2 range   5) h/o tobacco abuse-counseled on smoking cessation -Currently not smoking.  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 -Hgb improved to 13.4   -monitor  8) recent RSV bronchitids about 1 month ago PLAN: -Discussed pt labwork today, 09/03/20; blood counts and chemistries are steady.  -The pt has no prohibitive toxicities from restarting Carfilzomib (C12D1) every two weeks at this time.  -Recommend pt begin Revlimid in two weeks with next dose of Carflizomib if no new symptoms. Pt has no prohibitive toxicities.  -Recommend pt stay up to date with age appropriate cancer screenings.  -Recommend pt receive the annual flu vaccine. Pt will schedule to receive in clinic.   -Discussed CDC guidelines regarding the COVID19 booster. Pt will seek the Moderna booster at his local pharmacy.  -Recommend pt wait two weeks between vaccinations if possible. -Recommend [facility] post-transplant immunizations as scheduled.  -Continue Pamidronate q8weeks -  Will see back in 4 weeks with labs   FOLLOW UP: -Plz schedule next 6 doses of Carfilzomib with portflush and labs -MD visit in 4 weeks   The total time spent in the appt was 20 minutes and more than 50%  was on counseling and direct patient cares.  All of the patient's questions were answered with apparent satisfaction. The patient knows to call the clinic with any problems, questions or concerns.Sullivan Lone MD [place] AAHIVMS [facility] Hematology/Oncology Physician [facility]  (Office):       [phone] (Work cell):  [phone] (Fax):           [phone]  I, Yevette Edwards, am acting as a scribe for Dr. Sullivan Lone.   .I have reviewed the above documentation for accuracy and completeness, and I agree with the above.

## 2020-09-03 NOTE — Patient Instructions (Signed)
[facility] Discharge Instructions for Patients Receiving Chemotherapy  Today you received the following chemotherapy agents Carfilzomib (KYPROLIS).  To help prevent nausea and vomiting after your treatment, we encourage you to take your nausea medication as prescribed.  If you develop nausea and vomiting that is not controlled by your nausea medication, call the clinic.   BELOW ARE SYMPTOMS THAT SHOULD BE REPORTED IMMEDIATELY:  *FEVER GREATER THAN 100.5 F  *CHILLS WITH OR WITHOUT FEVER  NAUSEA AND VOMITING THAT IS NOT CONTROLLED WITH YOUR NAUSEA MEDICATION  *UNUSUAL SHORTNESS OF BREATH  *UNUSUAL BRUISING OR BLEEDING  TENDERNESS IN MOUTH AND THROAT WITH OR WITHOUT PRESENCE OF ULCERS  *URINARY PROBLEMS  *BOWEL PROBLEMS  UNUSUAL RASH Items with * indicate a potential emergency and should be followed up as soon as possible.  Feel free to call the clinic should you have any questions or concerns. The clinic phone number is [phone].  Please show the CHEMO ALERT CARD at check-in to the Emergency Department and triage nurse.   

## 2020-09-03 NOTE — Progress Notes (Signed)
Per Dr. Irene Limbo: okay to treat with Scr of 1.58.

## 2020-09-17 ENCOUNTER — Other Ambulatory Visit: Payer: BC Managed Care – PPO

## 2020-09-17 ENCOUNTER — Other Ambulatory Visit: Payer: Self-pay | Admitting: *Deleted

## 2020-09-17 ENCOUNTER — Inpatient Hospital Stay: Payer: BC Managed Care – PPO | Attending: Hematology

## 2020-09-17 ENCOUNTER — Other Ambulatory Visit: Payer: Self-pay

## 2020-09-17 ENCOUNTER — Inpatient Hospital Stay: Payer: BC Managed Care – PPO

## 2020-09-17 VITALS — BP 124/81 | HR 72 | Temp 97.8°F | Resp 17

## 2020-09-17 DIAGNOSIS — C9000 Multiple myeloma not having achieved remission: Secondary | ICD-10-CM | POA: Insufficient documentation

## 2020-09-17 DIAGNOSIS — Z5112 Encounter for antineoplastic immunotherapy: Secondary | ICD-10-CM | POA: Diagnosis present

## 2020-09-17 DIAGNOSIS — F17210 Nicotine dependence, cigarettes, uncomplicated: Secondary | ICD-10-CM | POA: Diagnosis not present

## 2020-09-17 DIAGNOSIS — I10 Essential (primary) hypertension: Secondary | ICD-10-CM | POA: Diagnosis not present

## 2020-09-17 DIAGNOSIS — C9010 Plasma cell leukemia not having achieved remission: Secondary | ICD-10-CM | POA: Insufficient documentation

## 2020-09-17 DIAGNOSIS — N400 Enlarged prostate without lower urinary tract symptoms: Secondary | ICD-10-CM | POA: Diagnosis not present

## 2020-09-17 DIAGNOSIS — Z79899 Other long term (current) drug therapy: Secondary | ICD-10-CM | POA: Insufficient documentation

## 2020-09-17 DIAGNOSIS — Z800 Family history of malignant neoplasm of digestive organs: Secondary | ICD-10-CM | POA: Diagnosis not present

## 2020-09-17 DIAGNOSIS — Z7189 Other specified counseling: Secondary | ICD-10-CM

## 2020-09-17 DIAGNOSIS — B1920 Unspecified viral hepatitis C without hepatic coma: Secondary | ICD-10-CM | POA: Diagnosis not present

## 2020-09-17 DIAGNOSIS — Z7982 Long term (current) use of aspirin: Secondary | ICD-10-CM | POA: Diagnosis not present

## 2020-09-17 DIAGNOSIS — Z5111 Encounter for antineoplastic chemotherapy: Secondary | ICD-10-CM

## 2020-09-17 LAB — CMP (CANCER CENTER ONLY)
ALT: 11 U/L (ref 0–44)
AST: 14 U/L — ABNORMAL LOW (ref 15–41)
Albumin: 3.8 g/dL (ref 3.5–5.0)
Alkaline Phosphatase: 69 U/L (ref 38–126)
Anion gap: 6 (ref 5–15)
BUN: 22 mg/dL (ref 8–23)
CO2: 23 mmol/L (ref 22–32)
Calcium: 9.4 mg/dL (ref 8.9–10.3)
Chloride: 112 mmol/L — ABNORMAL HIGH (ref 98–111)
Creatinine: 1.69 mg/dL — ABNORMAL HIGH (ref 0.61–1.24)
GFR, Estimated: 42 mL/min — ABNORMAL LOW (ref >60)
Glucose, Bld: 96 mg/dL (ref 70–99)
Potassium: 4.2 mmol/L (ref 3.5–5.1)
Sodium: 141 mmol/L (ref 135–145)
Total Bilirubin: 0.5 mg/dL (ref 0.3–1.2)
Total Protein: 6.9 g/dL (ref 6.5–8.1)

## 2020-09-17 LAB — CBC WITH DIFFERENTIAL/PLATELET
Abs Immature Granulocytes: 0.03 10*3/uL (ref 0.00–0.07)
Basophils Absolute: 0 10*3/uL (ref 0.0–0.1)
Basophils Relative: 1 %
Eosinophils Absolute: 0.1 10*3/uL (ref 0.0–0.5)
Eosinophils Relative: 1 %
HCT: 34.4 % — ABNORMAL LOW (ref 39.0–52.0)
Hemoglobin: 11.8 g/dL — ABNORMAL LOW (ref 13.0–17.0)
Immature Granulocytes: 0 %
Lymphocytes Relative: 22 %
Lymphs Abs: 1.5 10*3/uL (ref 0.7–4.0)
MCH: 33.3 pg (ref 26.0–34.0)
MCHC: 34.3 g/dL (ref 30.0–36.0)
MCV: 97.2 fL (ref 80.0–100.0)
Monocytes Absolute: 0.5 10*3/uL (ref 0.1–1.0)
Monocytes Relative: 8 %
Neutro Abs: 4.8 10*3/uL (ref 1.7–7.7)
Neutrophils Relative %: 68 %
Platelets: 203 10*3/uL (ref 150–400)
RBC: 3.54 MIL/uL — ABNORMAL LOW (ref 4.22–5.81)
RDW: 15.9 % — ABNORMAL HIGH (ref 11.5–15.5)
WBC: 7 10*3/uL (ref 4.0–10.5)
nRBC: 0 % (ref 0.0–0.2)

## 2020-09-17 MED ORDER — ACETAMINOPHEN 325 MG PO TABS
ORAL_TABLET | ORAL | Status: AC
  Filled 2020-09-17: qty 2

## 2020-09-17 MED ORDER — FAMOTIDINE 20 MG PO TABS
ORAL_TABLET | ORAL | Status: AC
  Filled 2020-09-17: qty 1

## 2020-09-17 MED ORDER — SODIUM CHLORIDE 0.9 % IV SOLN
20.0000 mg | Freq: Once | INTRAVENOUS | Status: AC
  Administered 2020-09-17: 20 mg via INTRAVENOUS
  Filled 2020-09-17: qty 20

## 2020-09-17 MED ORDER — SODIUM CHLORIDE 0.9 % IV SOLN
Freq: Once | INTRAVENOUS | Status: DC
  Filled 2020-09-17: qty 250

## 2020-09-17 MED ORDER — DEXTROSE 5 % IV SOLN
56.0000 mg/m2 | Freq: Once | INTRAVENOUS | Status: AC
  Administered 2020-09-17: 100 mg via INTRAVENOUS
  Filled 2020-09-17: qty 15

## 2020-09-17 MED ORDER — SODIUM CHLORIDE 0.9 % IV SOLN
Freq: Once | INTRAVENOUS | Status: AC
  Filled 2020-09-17: qty 250

## 2020-09-17 MED ORDER — ACETAMINOPHEN 325 MG PO TABS
650.0000 mg | ORAL_TABLET | Freq: Once | ORAL | Status: AC
  Administered 2020-09-17: 650 mg via ORAL

## 2020-09-17 MED ORDER — LENALIDOMIDE 10 MG PO CAPS: 10.0000 mg | ORAL_CAPSULE | Freq: Every day | ORAL | 0 refills | Status: DC

## 2020-09-17 MED ORDER — FAMOTIDINE 20 MG PO TABS
20.0000 mg | ORAL_TABLET | Freq: Once | ORAL | Status: AC
  Administered 2020-09-17: 20 mg via ORAL

## 2020-09-17 NOTE — Telephone Encounter (Signed)
Received faxed refill request for Revlimid 10 mg from [facility] by [employer] Refilled per Verbal order from Dr. Irene Limbo Refill escribed to [facility] by Westley Gambles, [place] - [id number] Lenalidomide [facility] auth# [id number], 09/17/20

## 2020-09-17 NOTE — Patient Instructions (Signed)
[facility] Discharge Instructions for Patients Receiving Chemotherapy  Today you received the following chemotherapy agents:  Kyprolis  To help prevent nausea and vomiting after your treatment, we encourage you to take your nausea medication as directed.   If you develop nausea and vomiting that is not controlled by your nausea medication, call the clinic.   BELOW ARE SYMPTOMS THAT SHOULD BE REPORTED IMMEDIATELY:  *FEVER GREATER THAN 100.5 F  *CHILLS WITH OR WITHOUT FEVER  NAUSEA AND VOMITING THAT IS NOT CONTROLLED WITH YOUR NAUSEA MEDICATION  *UNUSUAL SHORTNESS OF BREATH  *UNUSUAL BRUISING OR BLEEDING  TENDERNESS IN MOUTH AND THROAT WITH OR WITHOUT PRESENCE OF ULCERS  *URINARY PROBLEMS  *BOWEL PROBLEMS  UNUSUAL RASH Items with * indicate a potential emergency and should be followed up as soon as possible.  Feel free to call the clinic should you have any questions or concerns. The clinic phone number is [phone].  Please show the CHEMO ALERT CARD at check-in to the Emergency Department and triage nurse.   

## 2020-09-17 NOTE — Progress Notes (Signed)
Per Dr. Irene Limbo, okay to treat with SCr 1.69.

## 2020-09-20 LAB — KAPPA/LAMBDA LIGHT CHAINS
Kappa free light chain: 22.4 mg/L — ABNORMAL HIGH (ref 3.3–19.4)
Kappa, lambda light chain ratio: 2.33 — ABNORMAL HIGH (ref 0.26–1.65)
Lambda free light chains: 9.6 mg/L (ref 5.7–26.3)

## 2020-10-01 ENCOUNTER — Other Ambulatory Visit: Payer: Self-pay

## 2020-10-01 ENCOUNTER — Inpatient Hospital Stay: Payer: BC Managed Care – PPO

## 2020-10-01 ENCOUNTER — Inpatient Hospital Stay (HOSPITAL_BASED_OUTPATIENT_CLINIC_OR_DEPARTMENT_OTHER): Payer: BC Managed Care – PPO | Admitting: Hematology

## 2020-10-01 VITALS — BP 105/94 | HR 71 | Temp 97.4°F | Resp 18 | Ht 68.0 in | Wt 154.8 lb

## 2020-10-01 DIAGNOSIS — Z5111 Encounter for antineoplastic chemotherapy: Secondary | ICD-10-CM

## 2020-10-01 DIAGNOSIS — C9010 Plasma cell leukemia not having achieved remission: Secondary | ICD-10-CM

## 2020-10-01 DIAGNOSIS — Z7189 Other specified counseling: Secondary | ICD-10-CM

## 2020-10-01 DIAGNOSIS — C9000 Multiple myeloma not having achieved remission: Secondary | ICD-10-CM | POA: Diagnosis not present

## 2020-10-01 DIAGNOSIS — C9011 Plasma cell leukemia in remission: Secondary | ICD-10-CM

## 2020-10-01 LAB — CBC WITH DIFFERENTIAL/PLATELET
Abs Immature Granulocytes: 0.04 10*3/uL (ref 0.00–0.07)
Basophils Absolute: 0 10*3/uL (ref 0.0–0.1)
Basophils Relative: 0 %
Eosinophils Absolute: 0.1 10*3/uL (ref 0.0–0.5)
Eosinophils Relative: 2 %
HCT: 39 % (ref 39.0–52.0)
Hemoglobin: 13 g/dL (ref 13.0–17.0)
Immature Granulocytes: 1 %
Lymphocytes Relative: 22 %
Lymphs Abs: 1.5 10*3/uL (ref 0.7–4.0)
MCH: 33.3 pg (ref 26.0–34.0)
MCHC: 33.3 g/dL (ref 30.0–36.0)
MCV: 100 fL (ref 80.0–100.0)
Monocytes Absolute: 0.4 10*3/uL (ref 0.1–1.0)
Monocytes Relative: 6 %
Neutro Abs: 4.9 10*3/uL (ref 1.7–7.7)
Neutrophils Relative %: 69 %
Platelets: 216 10*3/uL (ref 150–400)
RBC: 3.9 MIL/uL — ABNORMAL LOW (ref 4.22–5.81)
RDW: 15.1 % (ref 11.5–15.5)
WBC: 7.1 10*3/uL (ref 4.0–10.5)
nRBC: 0 % (ref 0.0–0.2)

## 2020-10-01 LAB — CMP (CANCER CENTER ONLY)
ALT: 10 U/L (ref 0–44)
AST: 13 U/L — ABNORMAL LOW (ref 15–41)
Albumin: 4.2 g/dL (ref 3.5–5.0)
Alkaline Phosphatase: 66 U/L (ref 38–126)
Anion gap: 7 (ref 5–15)
BUN: 26 mg/dL — ABNORMAL HIGH (ref 8–23)
CO2: 24 mmol/L (ref 22–32)
Calcium: 9.3 mg/dL (ref 8.9–10.3)
Chloride: 109 mmol/L (ref 98–111)
Creatinine: 1.58 mg/dL — ABNORMAL HIGH (ref 0.61–1.24)
GFR, Estimated: 49 mL/min — ABNORMAL LOW (ref >60)
Glucose, Bld: 115 mg/dL — ABNORMAL HIGH (ref 70–99)
Potassium: 4.5 mmol/L (ref 3.5–5.1)
Sodium: 140 mmol/L (ref 135–145)
Total Bilirubin: 0.6 mg/dL (ref 0.3–1.2)
Total Protein: 7.2 g/dL (ref 6.5–8.1)

## 2020-10-01 MED ORDER — SODIUM CHLORIDE 0.9 % IV SOLN
20.0000 mg | Freq: Once | INTRAVENOUS | Status: AC
  Administered 2020-10-01: 20 mg via INTRAVENOUS
  Filled 2020-10-01: qty 20

## 2020-10-01 MED ORDER — DEXTROSE 5 % IV SOLN
56.0000 mg/m2 | Freq: Once | INTRAVENOUS | Status: AC
  Administered 2020-10-01: 100 mg via INTRAVENOUS
  Filled 2020-10-01: qty 30

## 2020-10-01 MED ORDER — ACETAMINOPHEN 325 MG PO TABS
ORAL_TABLET | ORAL | Status: AC
  Filled 2020-10-01: qty 2

## 2020-10-01 MED ORDER — ACETAMINOPHEN 325 MG PO TABS
650.0000 mg | ORAL_TABLET | Freq: Once | ORAL | Status: AC
  Administered 2020-10-01: 650 mg via ORAL

## 2020-10-01 MED ORDER — SODIUM CHLORIDE 0.9 % IV SOLN
Freq: Once | INTRAVENOUS | Status: AC
  Filled 2020-10-01: qty 250

## 2020-10-01 MED ORDER — FAMOTIDINE 20 MG PO TABS
ORAL_TABLET | ORAL | Status: AC
  Filled 2020-10-01: qty 1

## 2020-10-01 MED ORDER — FAMOTIDINE 20 MG PO TABS
20.0000 mg | ORAL_TABLET | Freq: Once | ORAL | Status: AC
  Administered 2020-10-01: 20 mg via ORAL

## 2020-10-01 MED ORDER — SODIUM CHLORIDE 0.9% FLUSH
10.0000 mL | INTRAVENOUS | Status: DC | PRN
  Filled 2020-10-01: qty 10

## 2020-10-01 MED ORDER — HEPARIN SOD (PORK) LOCK FLUSH 100 UNIT/ML IV SOLN
500.0000 [IU] | Freq: Once | INTRAVENOUS | Status: DC | PRN
  Filled 2020-10-01: qty 5

## 2020-10-01 NOTE — Patient Instructions (Signed)
[facility] Discharge Instructions for Patients Receiving Chemotherapy  Today you received the following chemotherapy agent: Kyprolis.  To help prevent nausea and vomiting after your treatment, we encourage you to take your nausea medication as directed.   If you develop nausea and vomiting that is not controlled by your nausea medication, call the clinic.   BELOW ARE SYMPTOMS THAT SHOULD BE REPORTED IMMEDIATELY:  *FEVER GREATER THAN 100.5 F  *CHILLS WITH OR WITHOUT FEVER  NAUSEA AND VOMITING THAT IS NOT CONTROLLED WITH YOUR NAUSEA MEDICATION  *UNUSUAL SHORTNESS OF BREATH  *UNUSUAL BRUISING OR BLEEDING  TENDERNESS IN MOUTH AND THROAT WITH OR WITHOUT PRESENCE OF ULCERS  *URINARY PROBLEMS  *BOWEL PROBLEMS  UNUSUAL RASH Items with * indicate a potential emergency and should be followed up as soon as possible.  Feel free to call the clinic should you have any questions or concerns. The clinic phone number is [phone].  Please show the CHEMO ALERT CARD at check-in to the Emergency Department and triage nurse.   

## 2020-10-01 NOTE — Progress Notes (Signed)
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 His labs today 12/14/2017 show hgb 8.3, platelets 524k. He is doing well overall. He has staples in his right upper leg following a recent surgery and has not been in touch with the surgeons for a f/u of this. He is not receiving physical therapy and is not getting around at home.  He notes that he was not scheduled for a post hospitalization nephrology f/u and has no PCP.  On review of systems, pt reports intermittent fatigue, SOB, pain to the left leg, dizziness, dry mouth and denies fever, chills, night sweats, dysuria and any other accompanying symptoms.   INTERVAL HISTORY:   Andres Fernandez presents today for follow up and continue management of his myeloma/plasma cell leukemia. The patient's last visit with [name] was on 09/03/2020. The pt reports that he is doing well overall.  The pt reports that he has had no issues with restarting Revlimid. He is back to working a full work schedule. Pt is eating and resting well, but endorses regular fatigue.   Pt has yet to receive his COVID19 booster. He has received his flu vaccine and is on track with his vaccination schedule at [facility].   Lab results today (10/01/20) of CBC w/diff and CMP is as follows: all values are WNL except for RBC at 3.90, Glucose at 115, BUN at 26, Creatinine at 1.58, AST at 13, GFR Est at 49. 10/01/2020 K/L light chains is in progress 10/01/2020 MMP is in progress  On review of systems, pt reports headache, fatigue and denies fevers, chills, wheezing, SOB, back pain, new bone pain, abdominal pain, leg swelling and any other symptoms.   REVIEW OF SYSTEMS:   A 10+ POINT REVIEW OF SYSTEMS WAS OBTAINED including neurology,  dermatology, psychiatry, cardiac, respiratory, lymph, extremities, GI, GU, Musculoskeletal, constitutional, breasts, reproductive, HEENT.  All pertinent positives are noted in the HPI.  All others are negative.   MEDICAL HISTORY:  Past Medical History:  Diagnosis Date  . BPH (benign prostatic hyperplasia)   . Hepatitis C 11/27/2017  . Hypertension   . Plasma cell leukemia ([place]) 11/23/2017  . Tobacco abuse     SURGICAL HISTORY: Past Surgical History:  Procedure Laterality Date  . APPENDECTOMY    . FEMUR IM NAIL Right 11/20/2017   Procedure: RIGHT FEMORAL INTRAMEDULLARY (IM) NAIL;  Surgeon: Nicholes Stairs, MD;  Location: [place];  Service: Orthopedics;  Laterality: Right;    SOCIAL HISTORY: Social History   Socioeconomic History  . Marital status: Single    Spouse name: Not on file  . Number of children: Not on file  . Years of education: Not on file  . Highest education level: Not on file  Occupational History  . Not on file  Tobacco Use  . Smoking status: Current Every Day Smoker    Packs/day: 0.25    Types: Cigarettes    Last attempt to quit: 11/09/2017    Years since quitting: 2.8  . Smokeless tobacco: Never Used  . Tobacco comment: 1 cigarette/day as of 09/25/18  Vaping Use  . Vaping Use: Never used  Substance and Sexual Activity  . Alcohol use: Yes  . Drug use: No  . Sexual activity: Not on file  Other  Topics Concern  . Not on file  Social History Narrative  . Not on file   Social Determinants of Health   Financial Resource Strain:   . Difficulty of Paying Living Expenses: Not on file  Food Insecurity:   . Worried About Charity fundraiser in the Last Year: Not on file  . Ran Out of Food in the Last Year: Not on file  Transportation Needs:   . Lack of Transportation (Medical): Not on file  . Lack of Transportation (Non-Medical): Not on file  Physical Activity:   . Days of Exercise per Week: Not on file  . Minutes of Exercise per Session: Not on  file  Stress:   . Feeling of Stress : Not on file  Social Connections:   . Frequency of Communication with Friends and Family: Not on file  . Frequency of Social Gatherings with Friends and Family: Not on file  . Attends Religious Services: Not on file  . Active Member of Clubs or Organizations: Not on file  . Attends Archivist Meetings: Not on file  . Marital Status: Not on file  Intimate Partner Violence:   . Fear of Current or Ex-Partner: Not on file  . Emotionally Abused: Not on file  . Physically Abused: Not on file  . Sexually Abused: Not on file    FAMILY HISTORY: Family History  Problem Relation Age of Onset  . COPD Mother   . Liver cancer Mother     ALLERGIES:  has No Known Allergies.  MEDICATIONS:  . Current Outpatient Medications on File Prior to Visit  Medication Sig Dispense Refill  . acyclovir (ZOVIRAX) 400 MG tablet Take 1 tablet (400 mg total) by mouth daily. (Patient taking differently: Take 400 mg by mouth at bedtime. ) 30 tablet 11  . aspirin EC 81 MG tablet Take 1 tablet (81 mg total) by mouth daily. (Patient taking differently: Take 81 mg by mouth at bedtime. ) 60 tablet 11  . cholecalciferol (VITAMIN D) 400 units TABS tablet Take 400 Units by mouth at bedtime.     [name] lenalidomide (REVLIMID) 10 MG capsule Take 1 capsule (10 mg total) by mouth daily. Take for 21 days on, 7 days off, repeat every 28 days. 21 capsule 0  . Multiple Vitamins-Minerals (MULTIVITAMIN WITH MINERALS) tablet Take 1 tablet by mouth at bedtime.     . nicotine (NICODERM CQ - DOSED IN MG/24 HOURS) 14 mg/24hr patch PLACE 1 PATCH (14 MG TOTAL) ONTO THE SKIN DAILY. (Patient taking differently: Place 14 mg onto the skin daily. ) 28 patch 1  . ondansetron (ZOFRAN) 4 MG tablet Take 2 tablets (8 mg total) by mouth every 8 (eight) hours as needed for nausea. 30 tablet 0  . tiZANidine (ZANAFLEX) 2 MG tablet Take 1 tablet (2 mg total) by mouth every 8 (eight) hours as needed for muscle  spasms. 30 tablet 0  . Vitamin D, Ergocalciferol, (DRISDOL) 1.25 MG (50000 UNIT) CAPS capsule Take 1 capsule (50,000 Units total) by mouth once a week. (Patient taking differently: Take 50,000 Units by mouth every Thursday. ) 12 capsule 6   No current facility-administered medications on file prior to visit.    PHYSICAL EXAMINATION:  ECOG FS:1 - Symptomatic but completely ambulatory  Vitals:   10/01/20 0930  BP: (!) 105/94  Pulse: 71  Resp: 18  Temp: (!) 97.4 F (36.3 C)  SpO2: 100%   Wt Readings from Last 3 Encounters:  10/01/20 154 lb 12.8 oz (  70.2 kg)  09/03/20 157 lb 3.2 oz (71.3 kg)  08/20/20 152 lb (68.9 kg)   Body mass index is 23.54 kg/m.    GENERAL:alert, in no acute distress and comfortable SKIN: no acute rashes, no significant lesions EYES: conjunctiva are pink and non-injected, sclera anicteric OROPHARYNX: MMM, no exudates, no oropharyngeal erythema or ulceration NECK: supple, no JVD LYMPH:  no palpable lymphadenopathy in the cervical, axillary or inguinal regions LUNGS: clear to auscultation b/l with normal respiratory effort HEART: regular rate & rhythm ABDOMEN:  normoactive bowel sounds , non tender, not distended. No palpable hepatosplenomegaly.  Extremity: no pedal edema PSYCH: alert & oriented x 3 with fluent speech NEURO: no focal motor/sensory deficits  LABORATORY DATA:  I have reviewed the data as listed  . CBC Latest Ref Rng & Units 10/01/2020 09/17/2020 09/03/2020  WBC 4.0 - 10.5 K/uL 7.1 7.0 5.7  Hemoglobin 13.0 - 17.0 g/dL 13.0 11.8(L) 12.5(L)  Hematocrit 39 - 52 % 39.0 34.4(L) 37.0(L)  Platelets 150 - 400 K/uL 216 203 190   . CBC    Component Value Date/Time   WBC 7.1 10/01/2020 0858   RBC 3.90 (L) 10/01/2020 0858   HGB 13.0 10/01/2020 0858   HGB 11.9 (L) 08/27/2019 0922   HGB 8.3 (L) 12/14/2017 1230   HCT 39.0 10/01/2020 0858   HCT 23.9 (L) 12/14/2017 1230   PLT 216 10/01/2020 0858   PLT 203 08/27/2019 0922   PLT 524 (H)  12/14/2017 1230   MCV 100.0 10/01/2020 0858   MCV 88.4 12/14/2017 1230   MCH 33.3 10/01/2020 0858   MCHC 33.3 10/01/2020 0858   RDW 15.1 10/01/2020 0858   RDW 15.3 (H) 12/14/2017 1230   LYMPHSABS 1.5 10/01/2020 0858   LYMPHSABS 1.4 12/14/2017 1230   MONOABS 0.4 10/01/2020 0858   MONOABS 0.6 12/14/2017 1230   EOSABS 0.1 10/01/2020 0858   EOSABS 0.1 12/14/2017 1230   BASOSABS 0.0 10/01/2020 0858   BASOSABS 0.1 12/14/2017 1230     . CMP Latest Ref Rng & Units 10/01/2020 09/17/2020 09/03/2020  Glucose 70 - 99 mg/dL 115(H) 96 87  BUN 8 - 23 mg/dL 26(H) 22 23  Creatinine 0.61 - 1.24 mg/dL 1.58(H) 1.69(H) 1.58(H)  Sodium 135 - 145 mmol/L 140 141 143  Potassium 3.5 - 5.1 mmol/L 4.5 4.2 4.1  Chloride 98 - 111 mmol/L 109 112(H) 111  CO2 22 - 32 mmol/L _0 Calcium 8.9 - 10.3 mg/dL 9.3 9.4 8.7(L)  Total Protein 6.5 - 8.1 g/dL 7.2 6.9 7.1  Total Bilirubin 0.3 - 1.2 mg/dL 0.6 0.5 0.7  Alkaline Phos 38 - 126 U/L 66 69 67  AST 15 - 41 U/L 13(L) 14(L) 14(L)  ALT 0 - 44 U/L _1 03/09/20 ([id number]) Bone Marrow Biopsy at [facility]   07/02/2019 Bone Marrow Biopsy ([id number]) at [facility]    08/07/18 BM Bx:       RADIOGRAPHIC STUDIES: I have personally reviewed the radiological images as listed and agreed with the findings in the report. No results found.  ASSESSMENT & PLAN:   64 y.o. 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   Initial BM Bx showed >95% involvement with kappa restricted serum free light chains. > 20% plasma  cell in the peripheral blood concerning for plasma cell leukemia. MoL Cy-   08/07/18 BM Bx revealed normocellular bone marrow with trilineage hematopoiesis and 1%  plasma cells which indicates the pt is in remission 08/06/18 PET/CT revealed  Expansile lytic lesion involving the left iliac bone, unchanged. Lytic lesion involving the sternum, unchanged. While both are suspicious for myeloma, neither lesion is FDG avid. No hypermetabolic osseous lesions in the visualized axial and appendicular skeleton. Please note that the bilateral lower extremities were not imaged. No suspicious lymphadenopathy. Spleen is normal in size.    Pt began Ninlaro 25m maintenance after pt finished 13 cycles of CyBorD and BM bx showed only 1% plasma cells, then resumed CyBorD and held Ninlaro in anticipation of his tandem bone marrow transplants [facility] in late December 2019 [facility]  11/19/18 High dose chemotherapy with Melphalan 1438mm2 and autologous stem cell rescue  03/20/19 High dose chemotherapy with Melphalan 20057m2 and autologous stem cell rescue. Non-infectious diarrhea, uncomplicated course otherwise.  07/02/2019 PET scan with results revealing "1. No FDG avid bone lesions are identified. 2.  Asymmetric uptake within the right, greater than left, palatine tonsils with mild fullness of the right tonsillar soft tissue. ENT consult vs attention on follow up suggested. 3.  Innumerable mixed sclerotic and lytic lesions throughout the axial and appendicular skeleton without associated hypermetabolic activity. 4.  Ancillary CT findings as above."  07/02/2019 bone marrow biopsy ([id number])with results showing: "Trilineage hematopoieses with maturation. No plasma cell myeloma identified. Mild anemia and No Rouleaux or circulating plasma cells identified in the peripheral blood."  2) s/p Prophylactic IM nailing for rt femur  [facility] previously reported improvement and is currently progressively back to full time work.  3) Thrombocytopenia resolved   4) Renal insufficiency -- likely primarily related  to Myeloma. stable Plan -maintain follow up with nephrology referral to Dr ColMarval RegalaNarda Amberdney for continued treatment -Pt completed 8 weeks of maverick for hep c with Dr ComLinus Salmons 07/08/18.  -Kidney numbers improved. Creatinine in the 1.7-2.2 range   5) h/o tobacco abuse-counseled on smoking cessation -Currently not smoking.  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 -Hgb improved to 13.4   -monitor  8) recent RSV bronchitids about 1 month ago  PLAN: -Discussed pt labwork today, 10/01/20; blood counts look good, blood chemistries are stable, MMP & K/L light chains are in progress.  -Discussed 09/17/2020 K/L light chains shows K/L light chain up slightly at 2.33.  -No lab or clinical evidence of Multiple Myeloma progression at this time.  -The pt has no prohibitive toxicities from continuing Carfilzomib (C13D1) every two weeks at this time. -The pt has no prohibitive toxicities from continuing 10 mg Revlimid 3 weeks on, 1 week off at this time. -Recommend pt seek the ModAltaoster  -Discussed crowd avoidance/safety strategies when going out in public.  -Continue Pamidronate q8weeks -Will see back in 4 weeks with labs    FOLLOW UP: -Plz schedule next 6 doses of Carfilzomib with portflush and labs -MD visit in 4 weeks   The total time spent in the appt was 20 minutes and more than 50% was on counseling and direct patient cares.  All of the patient's questions were answered with apparent satisfaction. The patient knows to call the clinic with any problems, questions or concerns.   Shanikia Kernodle  Irene Limbo MD [place] AAHIVMS [facility] Hematology/Oncology Physician [facility]  (Office):       [phone] (Work cell):  [phone] (Fax):           [phone]  I, Yevette Edwards, am acting as a scribe for Dr. Sullivan Lone.   .I have reviewed the above documentation for accuracy  and completeness, and I agree with the above. Brunetta Genera MD

## 2020-10-01 NOTE — Progress Notes (Signed)
OK to treat with current creatinine level per Dr. Irene Limbo

## 2020-10-04 LAB — MULTIPLE MYELOMA PANEL, SERUM
Albumin SerPl Elph-Mcnc: 3.8 g/dL (ref 2.9–4.4)
Albumin/Glob SerPl: 1.4 (ref 0.7–1.7)
Alpha 1: 0.2 g/dL (ref 0.0–0.4)
Alpha2 Glob SerPl Elph-Mcnc: 1.1 g/dL — ABNORMAL HIGH (ref 0.4–1.0)
B-Globulin SerPl Elph-Mcnc: 0.8 g/dL (ref 0.7–1.3)
Gamma Glob SerPl Elph-Mcnc: 0.6 g/dL (ref 0.4–1.8)
Globulin, Total: 2.8 g/dL (ref 2.2–3.9)
IgA: 59 mg/dL — ABNORMAL LOW (ref 61–437)
IgG (Immunoglobin G), Serum: 656 mg/dL (ref 603–1613)
IgM (Immunoglobulin M), Srm: 8 mg/dL — ABNORMAL LOW (ref 20–172)
Total Protein ELP: 6.6 g/dL (ref 6.0–8.5)

## 2020-10-04 LAB — KAPPA/LAMBDA LIGHT CHAINS
Kappa free light chain: 25.6 mg/L — ABNORMAL HIGH (ref 3.3–19.4)
Kappa, lambda light chain ratio: 1.78 — ABNORMAL HIGH (ref 0.26–1.65)
Lambda free light chains: 14.4 mg/L (ref 5.7–26.3)

## 2020-10-15 ENCOUNTER — Other Ambulatory Visit: Payer: Self-pay | Admitting: Hematology

## 2020-10-15 ENCOUNTER — Inpatient Hospital Stay: Payer: BC Managed Care – PPO

## 2020-10-15 ENCOUNTER — Other Ambulatory Visit: Payer: Self-pay

## 2020-10-15 ENCOUNTER — Other Ambulatory Visit: Payer: Self-pay | Admitting: *Deleted

## 2020-10-15 ENCOUNTER — Other Ambulatory Visit: Payer: BC Managed Care – PPO

## 2020-10-15 ENCOUNTER — Inpatient Hospital Stay: Payer: BC Managed Care – PPO | Attending: Hematology

## 2020-10-15 VITALS — BP 118/71 | HR 69 | Temp 97.9°F | Resp 18 | Ht 68.0 in | Wt 159.0 lb

## 2020-10-15 DIAGNOSIS — Z7982 Long term (current) use of aspirin: Secondary | ICD-10-CM | POA: Insufficient documentation

## 2020-10-15 DIAGNOSIS — C9010 Plasma cell leukemia not having achieved remission: Secondary | ICD-10-CM | POA: Diagnosis not present

## 2020-10-15 DIAGNOSIS — R5383 Other fatigue: Secondary | ICD-10-CM | POA: Insufficient documentation

## 2020-10-15 DIAGNOSIS — R0602 Shortness of breath: Secondary | ICD-10-CM | POA: Diagnosis not present

## 2020-10-15 DIAGNOSIS — M79605 Pain in left leg: Secondary | ICD-10-CM | POA: Insufficient documentation

## 2020-10-15 DIAGNOSIS — Z5111 Encounter for antineoplastic chemotherapy: Secondary | ICD-10-CM

## 2020-10-15 DIAGNOSIS — R682 Dry mouth, unspecified: Secondary | ICD-10-CM | POA: Diagnosis not present

## 2020-10-15 DIAGNOSIS — C9000 Multiple myeloma not having achieved remission: Secondary | ICD-10-CM | POA: Diagnosis not present

## 2020-10-15 DIAGNOSIS — Z79899 Other long term (current) drug therapy: Secondary | ICD-10-CM | POA: Insufficient documentation

## 2020-10-15 DIAGNOSIS — N289 Disorder of kidney and ureter, unspecified: Secondary | ICD-10-CM | POA: Diagnosis not present

## 2020-10-15 DIAGNOSIS — Z9484 Stem cells transplant status: Secondary | ICD-10-CM | POA: Insufficient documentation

## 2020-10-15 DIAGNOSIS — Z7189 Other specified counseling: Secondary | ICD-10-CM

## 2020-10-15 DIAGNOSIS — R42 Dizziness and giddiness: Secondary | ICD-10-CM | POA: Insufficient documentation

## 2020-10-15 DIAGNOSIS — I10 Essential (primary) hypertension: Secondary | ICD-10-CM | POA: Diagnosis not present

## 2020-10-15 DIAGNOSIS — F17210 Nicotine dependence, cigarettes, uncomplicated: Secondary | ICD-10-CM | POA: Diagnosis not present

## 2020-10-15 LAB — CBC WITH DIFFERENTIAL/PLATELET
Abs Immature Granulocytes: 0.03 10*3/uL (ref 0.00–0.07)
Basophils Absolute: 0.1 10*3/uL (ref 0.0–0.1)
Basophils Relative: 1 %
Eosinophils Absolute: 0.2 10*3/uL (ref 0.0–0.5)
Eosinophils Relative: 3 %
HCT: 35.4 % — ABNORMAL LOW (ref 39.0–52.0)
Hemoglobin: 12.4 g/dL — ABNORMAL LOW (ref 13.0–17.0)
Immature Granulocytes: 1 %
Lymphocytes Relative: 23 %
Lymphs Abs: 1.6 10*3/uL (ref 0.7–4.0)
MCH: 32.6 pg (ref 26.0–34.0)
MCHC: 35 g/dL (ref 30.0–36.0)
MCV: 93.2 fL (ref 80.0–100.0)
Monocytes Absolute: 0.7 10*3/uL (ref 0.1–1.0)
Monocytes Relative: 10 %
Neutro Abs: 4.1 10*3/uL (ref 1.7–7.7)
Neutrophils Relative %: 62 %
Platelets: 242 10*3/uL (ref 150–400)
RBC: 3.8 MIL/uL — ABNORMAL LOW (ref 4.22–5.81)
RDW: 13.8 % (ref 11.5–15.5)
WBC: 6.6 10*3/uL (ref 4.0–10.5)
nRBC: 0 % (ref 0.0–0.2)

## 2020-10-15 LAB — CMP (CANCER CENTER ONLY)
ALT: 14 U/L (ref 0–44)
AST: 14 U/L — ABNORMAL LOW (ref 15–41)
Albumin: 3.8 g/dL (ref 3.5–5.0)
Alkaline Phosphatase: 72 U/L (ref 38–126)
Anion gap: 8 (ref 5–15)
BUN: 32 mg/dL — ABNORMAL HIGH (ref 8–23)
CO2: 21 mmol/L — ABNORMAL LOW (ref 22–32)
Calcium: 8.9 mg/dL (ref 8.9–10.3)
Chloride: 111 mmol/L (ref 98–111)
Creatinine: 1.8 mg/dL — ABNORMAL HIGH (ref 0.61–1.24)
GFR, Estimated: 42 mL/min — ABNORMAL LOW (ref >60)
Glucose, Bld: 97 mg/dL (ref 70–99)
Potassium: 4.2 mmol/L (ref 3.5–5.1)
Sodium: 140 mmol/L (ref 135–145)
Total Bilirubin: 0.4 mg/dL (ref 0.3–1.2)
Total Protein: 7.1 g/dL (ref 6.5–8.1)

## 2020-10-15 MED ORDER — SODIUM CHLORIDE 0.9 % IV SOLN
Freq: Once | INTRAVENOUS | Status: AC
  Filled 2020-10-15: qty 250

## 2020-10-15 MED ORDER — TIZANIDINE HCL 2 MG PO TABS: 2.0000 mg | ORAL_TABLET | Freq: Three times a day (TID) | ORAL | 0 refills | Status: DC | PRN

## 2020-10-15 MED ORDER — ACETAMINOPHEN 325 MG PO TABS
650.0000 mg | ORAL_TABLET | Freq: Once | ORAL | Status: AC
  Administered 2020-10-15: 650 mg via ORAL

## 2020-10-15 MED ORDER — DEXTROSE 5 % IV SOLN
56.0000 mg/m2 | Freq: Once | INTRAVENOUS | Status: AC
  Administered 2020-10-15: 100 mg via INTRAVENOUS
  Filled 2020-10-15: qty 30

## 2020-10-15 MED ORDER — FAMOTIDINE 20 MG PO TABS
20.0000 mg | ORAL_TABLET | Freq: Once | ORAL | Status: AC
  Administered 2020-10-15: 20 mg via ORAL

## 2020-10-15 MED ORDER — FAMOTIDINE 20 MG PO TABS
ORAL_TABLET | ORAL | Status: AC
  Filled 2020-10-15: qty 1

## 2020-10-15 MED ORDER — SODIUM CHLORIDE 0.9 % IV SOLN
INTRAVENOUS | Status: AC
  Filled 2020-10-15: qty 250

## 2020-10-15 MED ORDER — LENALIDOMIDE 10 MG PO CAPS: 10.0000 mg | ORAL_CAPSULE | Freq: Every day | ORAL | 0 refills | Status: DC

## 2020-10-15 MED ORDER — SODIUM CHLORIDE 0.9 % IV SOLN
20.0000 mg | Freq: Once | INTRAVENOUS | Status: AC
  Administered 2020-10-15: 20 mg via INTRAVENOUS
  Filled 2020-10-15: qty 20

## 2020-10-15 MED ORDER — ACETAMINOPHEN 325 MG PO TABS
ORAL_TABLET | ORAL | Status: AC
  Filled 2020-10-15: qty 2

## 2020-10-15 NOTE — Patient Instructions (Signed)
[facility] Discharge Instructions for Patients Receiving Chemotherapy  Today you received the following chemotherapy agents:  Kyprolis  To help prevent nausea and vomiting after your treatment, we encourage you to take your nausea medication as directed.   If you develop nausea and vomiting that is not controlled by your nausea medication, call the clinic.   BELOW ARE SYMPTOMS THAT SHOULD BE REPORTED IMMEDIATELY:  *FEVER GREATER THAN 100.5 F  *CHILLS WITH OR WITHOUT FEVER  NAUSEA AND VOMITING THAT IS NOT CONTROLLED WITH YOUR NAUSEA MEDICATION  *UNUSUAL SHORTNESS OF BREATH  *UNUSUAL BRUISING OR BLEEDING  TENDERNESS IN MOUTH AND THROAT WITH OR WITHOUT PRESENCE OF ULCERS  *URINARY PROBLEMS  *BOWEL PROBLEMS  UNUSUAL RASH Items with * indicate a potential emergency and should be followed up as soon as possible.  Feel free to call the clinic should you have any questions or concerns. The clinic phone number is [phone].  Please show the CHEMO ALERT CARD at check-in to the Emergency Department and triage nurse.   

## 2020-10-15 NOTE — Progress Notes (Unsigned)
tizanidine

## 2020-10-15 NOTE — Progress Notes (Signed)
Per Dr. Irene Limbo, okay to treat with SCr 1.8.

## 2020-10-28 ENCOUNTER — Other Ambulatory Visit: Payer: Self-pay | Admitting: Hematology

## 2020-10-29 ENCOUNTER — Other Ambulatory Visit: Payer: BC Managed Care – PPO

## 2020-10-29 ENCOUNTER — Inpatient Hospital Stay: Payer: BC Managed Care – PPO

## 2020-10-29 ENCOUNTER — Other Ambulatory Visit: Payer: Self-pay

## 2020-10-29 ENCOUNTER — Inpatient Hospital Stay (HOSPITAL_BASED_OUTPATIENT_CLINIC_OR_DEPARTMENT_OTHER): Payer: BC Managed Care – PPO | Admitting: Hematology

## 2020-10-29 VITALS — BP 118/72 | HR 68 | Temp 98.1°F | Resp 18

## 2020-10-29 DIAGNOSIS — Z5111 Encounter for antineoplastic chemotherapy: Secondary | ICD-10-CM

## 2020-10-29 DIAGNOSIS — C9011 Plasma cell leukemia in remission: Secondary | ICD-10-CM

## 2020-10-29 DIAGNOSIS — C9010 Plasma cell leukemia not having achieved remission: Secondary | ICD-10-CM | POA: Diagnosis not present

## 2020-10-29 DIAGNOSIS — Z7189 Other specified counseling: Secondary | ICD-10-CM

## 2020-10-29 LAB — CMP (CANCER CENTER ONLY)
ALT: 13 U/L (ref 0–44)
AST: 12 U/L — ABNORMAL LOW (ref 15–41)
Albumin: 3.8 g/dL (ref 3.5–5.0)
Alkaline Phosphatase: 68 U/L (ref 38–126)
Anion gap: 10 (ref 5–15)
BUN: 28 mg/dL — ABNORMAL HIGH (ref 8–23)
CO2: 22 mmol/L (ref 22–32)
Calcium: 9.2 mg/dL (ref 8.9–10.3)
Chloride: 110 mmol/L (ref 98–111)
Creatinine: 1.6 mg/dL — ABNORMAL HIGH (ref 0.61–1.24)
GFR, Estimated: 48 mL/min — ABNORMAL LOW (ref >60)
Glucose, Bld: 88 mg/dL (ref 70–99)
Potassium: 4.3 mmol/L (ref 3.5–5.1)
Sodium: 142 mmol/L (ref 135–145)
Total Bilirubin: 0.5 mg/dL (ref 0.3–1.2)
Total Protein: 7 g/dL (ref 6.5–8.1)

## 2020-10-29 LAB — CBC WITH DIFFERENTIAL/PLATELET
Abs Immature Granulocytes: 0.03 10*3/uL (ref 0.00–0.07)
Basophils Absolute: 0.1 10*3/uL (ref 0.0–0.1)
Basophils Relative: 2 %
Eosinophils Absolute: 0.2 10*3/uL (ref 0.0–0.5)
Eosinophils Relative: 3 %
HCT: 37.3 % — ABNORMAL LOW (ref 39.0–52.0)
Hemoglobin: 12.7 g/dL — ABNORMAL LOW (ref 13.0–17.0)
Immature Granulocytes: 0 %
Lymphocytes Relative: 23 %
Lymphs Abs: 1.6 10*3/uL (ref 0.7–4.0)
MCH: 32.6 pg (ref 26.0–34.0)
MCHC: 34 g/dL (ref 30.0–36.0)
MCV: 95.6 fL (ref 80.0–100.0)
Monocytes Absolute: 0.6 10*3/uL (ref 0.1–1.0)
Monocytes Relative: 9 %
Neutro Abs: 4.3 10*3/uL (ref 1.7–7.7)
Neutrophils Relative %: 63 %
Platelets: 244 10*3/uL (ref 150–400)
RBC: 3.9 MIL/uL — ABNORMAL LOW (ref 4.22–5.81)
RDW: 13.8 % (ref 11.5–15.5)
WBC: 6.9 10*3/uL (ref 4.0–10.5)
nRBC: 0 % (ref 0.0–0.2)

## 2020-10-29 MED ORDER — FAMOTIDINE 20 MG PO TABS
20.0000 mg | ORAL_TABLET | Freq: Once | ORAL | Status: AC
  Administered 2020-10-29: 20 mg via ORAL

## 2020-10-29 MED ORDER — ACETAMINOPHEN 325 MG PO TABS
650.0000 mg | ORAL_TABLET | Freq: Once | ORAL | Status: AC
  Administered 2020-10-29: 650 mg via ORAL

## 2020-10-29 MED ORDER — SODIUM CHLORIDE 0.9 % IV SOLN
20.0000 mg | Freq: Once | INTRAVENOUS | Status: AC
  Administered 2020-10-29: 20 mg via INTRAVENOUS
  Filled 2020-10-29: qty 20

## 2020-10-29 MED ORDER — ACETAMINOPHEN 325 MG PO TABS
ORAL_TABLET | ORAL | Status: AC
  Filled 2020-10-29: qty 2

## 2020-10-29 MED ORDER — DEXTROSE 5 % IV SOLN
56.0000 mg/m2 | Freq: Once | INTRAVENOUS | Status: AC
  Administered 2020-10-29: 100 mg via INTRAVENOUS
  Filled 2020-10-29: qty 30

## 2020-10-29 MED ORDER — SODIUM CHLORIDE 0.9 % IV SOLN
Freq: Once | INTRAVENOUS | Status: AC
  Filled 2020-10-29: qty 250

## 2020-10-29 MED ORDER — FAMOTIDINE 20 MG PO TABS
ORAL_TABLET | ORAL | Status: AC
  Filled 2020-10-29: qty 1

## 2020-10-29 NOTE — Patient Instructions (Signed)
[facility] Discharge Instructions for Patients Receiving Chemotherapy  Today you received the following chemotherapy agents:  Kyprolis  To help prevent nausea and vomiting after your treatment, we encourage you to take your nausea medication as directed.   If you develop nausea and vomiting that is not controlled by your nausea medication, call the clinic.   BELOW ARE SYMPTOMS THAT SHOULD BE REPORTED IMMEDIATELY:  *FEVER GREATER THAN 100.5 F  *CHILLS WITH OR WITHOUT FEVER  NAUSEA AND VOMITING THAT IS NOT CONTROLLED WITH YOUR NAUSEA MEDICATION  *UNUSUAL SHORTNESS OF BREATH  *UNUSUAL BRUISING OR BLEEDING  TENDERNESS IN MOUTH AND THROAT WITH OR WITHOUT PRESENCE OF ULCERS  *URINARY PROBLEMS  *BOWEL PROBLEMS  UNUSUAL RASH Items with * indicate a potential emergency and should be followed up as soon as possible.  Feel free to call the clinic should you have any questions or concerns. The clinic phone number is [phone].  Please show the CHEMO ALERT CARD at check-in to the Emergency Department and triage nurse.   

## 2020-10-29 NOTE — Progress Notes (Signed)
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 His labs today 12/14/2017 show hgb 8.3, platelets 524k. He is doing well overall. He has staples in his right upper leg following a recent surgery and has not been in touch with the surgeons for a f/u of this. He is not receiving physical therapy and is not getting around at home.  He notes that he was not scheduled for a post hospitalization nephrology f/u and has no PCP.  On review of systems, pt reports intermittent fatigue, SOB, pain to the left leg, dizziness, dry mouth and denies fever, chills, night sweats, dysuria and any other accompanying symptoms.   INTERVAL HISTORY:    Andres Fernandez presents today for follow up and continue management of his myeloma/plasma cell leukemia. The patient's last visit with [name] was on 10/01/2020. The pt reports that he is doing well overall.  The pt reports that he has been well and denies any new concerns. He denies any recent infection symptoms or muscle cramps. Pt continues taking Vitamin D and Aspirin daily. Pt has no issues taking Revlimid as prescribed.  Lab results today (10/29/20) of CBC w/diff and CMP is as follows: all values are WNL except for RBC at 3.90, Hgb at 12.7, HCT at 37.3, BUN at 28, Creatinine at 1.60, AST at 12, GFR Est at 48. 10/29/2020 K/L light chains is in progress  On review of systems, pt denies fevers, chills, night sweats, cramps, diarrhea and any other symptoms.   REVIEW OF SYSTEMS:   A 10+ POINT REVIEW OF SYSTEMS WAS OBTAINED including neurology, dermatology, psychiatry, cardiac, respiratory, lymph, extremities, GI, GU, Musculoskeletal, constitutional, breasts, reproductive, HEENT.  All pertinent positives are noted in the HPI.   All others are negative.   MEDICAL HISTORY:  Past Medical History:  Diagnosis Date  . BPH (benign prostatic hyperplasia)   . Hepatitis C 11/27/2017  . Hypertension   . Plasma cell leukemia ([place]) 11/23/2017  . Tobacco abuse     SURGICAL HISTORY: Past Surgical History:  Procedure Laterality Date  . APPENDECTOMY    . FEMUR IM NAIL Right 11/20/2017   Procedure: RIGHT FEMORAL INTRAMEDULLARY (IM) NAIL;  Surgeon: Nicholes Stairs, MD;  Location: [facility];  Service: Orthopedics;  Laterality: Right;    SOCIAL HISTORY: Social History   Socioeconomic History  . Marital status: Single    Spouse name: Not on file  . Number of children: Not on file  . Years of education: Not on file  . Highest education level: Not on file  Occupational History  . Not on file  Tobacco Use  . Smoking status: Current Every Day Smoker    Packs/day: 0.25    Types: Cigarettes    Last attempt to quit: 11/09/2017    Years since quitting: 2.9  . Smokeless tobacco: Never Used  . Tobacco comment: 1 cigarette/day as of 09/25/18  Vaping Use  . Vaping Use: Never used  Substance and Sexual Activity  . Alcohol use: Yes  . Drug use: No  . Sexual activity: Not on file  Other Topics Concern  . Not on file  Social History Narrative  . Not on file   Social Determinants of Health   Financial Resource Strain:   . Difficulty of Paying  Living Expenses: Not on file  Food Insecurity:   . Worried About Charity fundraiser in the Last Year: Not on file  . Ran Out of Food in the Last Year: Not on file  Transportation Needs:   . Lack of Transportation (Medical): Not on file  . Lack of Transportation (Non-Medical): Not on file  Physical Activity:   . Days of Exercise per Week: Not on file  . Minutes of Exercise per Session: Not on file  Stress:   . Feeling of Stress : Not on file  Social Connections:   . Frequency of Communication with Friends and Family: Not on file  . Frequency of Social Gatherings with  Friends and Family: Not on file  . Attends Religious Services: Not on file  . Active Member of Clubs or Organizations: Not on file  . Attends Archivist Meetings: Not on file  . Marital Status: Not on file  Intimate Partner Violence:   . Fear of Current or Ex-Partner: Not on file  . Emotionally Abused: Not on file  . Physically Abused: Not on file  . Sexually Abused: Not on file    FAMILY HISTORY: Family History  Problem Relation Age of Onset  . COPD Mother   . Liver cancer Mother     ALLERGIES:  has No Known Allergies.  MEDICATIONS:  . Current Outpatient Medications on File Prior to Visit  Medication Sig Dispense Refill  . acyclovir (ZOVIRAX) 400 MG tablet Take 1 tablet (400 mg total) by mouth daily. (Patient taking differently: Take 400 mg by mouth at bedtime. ) 30 tablet 11  . Albuterol Sulfate (PROAIR RESPICLICK) 250 (90 Base) MCG/ACT AEPB Inhale into the lungs.    [name] aspirin EC 81 MG tablet Take 1 tablet (81 mg total) by mouth daily. (Patient taking differently: Take 81 mg by mouth at bedtime. ) 60 tablet 11  . cholecalciferol (VITAMIN D) 400 units TABS tablet Take 400 Units by mouth at bedtime.     [name] lenalidomide (REVLIMID) 10 MG capsule Take 1 capsule (10 mg total) by mouth daily. Take for 21 days on, 7 days off, repeat every 28 days. 21 capsule 0  . Multiple Vitamins-Minerals (MULTIVITAMIN WITH MINERALS) tablet Take 1 tablet by mouth at bedtime.     . nicotine (NICODERM CQ - DOSED IN MG/24 HOURS) 14 mg/24hr patch PLACE 1 PATCH (14 MG TOTAL) ONTO THE SKIN DAILY. (Patient taking differently: Place 14 mg onto the skin daily. ) 28 patch 1  . ondansetron (ZOFRAN) 4 MG tablet Take 2 tablets (8 mg total) by mouth every 8 (eight) hours as needed for nausea. 30 tablet 0  . tiZANidine (ZANAFLEX) 2 MG tablet Take 1 tablet (2 mg total) by mouth every 8 (eight) hours as needed for muscle spasms. 30 tablet 0  . Vitamin D, Ergocalciferol, (DRISDOL) 1.25 MG (50000 UNIT) CAPS  capsule Take 1 capsule (50,000 Units total) by mouth once a week. (Patient taking differently: Take 50,000 Units by mouth every Thursday. ) 12 capsule 6   No current facility-administered medications on file prior to visit.    PHYSICAL EXAMINATION:  ECOG FS:1 - Symptomatic but completely ambulatory  There were no vitals filed for this visit. Wt Readings from Last 3 Encounters:  10/15/20 159 lb (72.1 kg)  10/01/20 154 lb 12.8 oz (70.2 kg)  09/03/20 157 lb 3.2 oz (71.3 kg)   There is no height or weight on file to calculate BMI.    Exam was given  in a chair   GENERAL:alert, in no acute distress and comfortable SKIN: no acute rashes, no significant lesions EYES: conjunctiva are pink and non-injected, sclera anicteric OROPHARYNX: MMM, no exudates, no oropharyngeal erythema or ulceration NECK: supple, no JVD LYMPH:  no palpable lymphadenopathy in the cervical, axillary or inguinal regions LUNGS: clear to auscultation b/l with normal respiratory effort HEART: regular rate & rhythm ABDOMEN:  normoactive bowel sounds , non tender, not distended. No palpable hepatosplenomegaly.  Extremity: no pedal edema PSYCH: alert & oriented x 3 with fluent speech NEURO: no focal motor/sensory deficits  LABORATORY DATA:  I have reviewed the data as listed  . CBC Latest Ref Rng & Units 10/29/2020 10/15/2020 10/01/2020  WBC 4.0 - 10.5 K/uL 6.9 6.6 7.1  Hemoglobin 13.0 - 17.0 g/dL 12.7(L) 12.4(L) 13.0  Hematocrit 39 - 52 % 37.3(L) 35.4(L) 39.0  Platelets 150 - 400 K/uL 244 242 216   . CBC    Component Value Date/Time   WBC 6.9 10/29/2020 0813   RBC 3.90 (L) 10/29/2020 0813   HGB 12.7 (L) 10/29/2020 0813   HGB 11.9 (L) 08/27/2019 0922   HGB 8.3 (L) 12/14/2017 1230   HCT 37.3 (L) 10/29/2020 0813   HCT 23.9 (L) 12/14/2017 1230   PLT 244 10/29/2020 0813   PLT 203 08/27/2019 0922   PLT 524 (H) 12/14/2017 1230   MCV 95.6 10/29/2020 0813   MCV 88.4 12/14/2017 1230   MCH 32.6 10/29/2020 0813    MCHC 34.0 10/29/2020 0813   RDW 13.8 10/29/2020 0813   RDW 15.3 (H) 12/14/2017 1230   LYMPHSABS 1.6 10/29/2020 0813   LYMPHSABS 1.4 12/14/2017 1230   MONOABS 0.6 10/29/2020 0813   MONOABS 0.6 12/14/2017 1230   EOSABS 0.2 10/29/2020 0813   EOSABS 0.1 12/14/2017 1230   BASOSABS 0.1 10/29/2020 0813   BASOSABS 0.1 12/14/2017 1230     . CMP Latest Ref Rng & Units 10/29/2020 10/15/2020 10/01/2020  Glucose 70 - 99 mg/dL 88 97 115(H)  BUN 8 - 23 mg/dL 28(H) 32(H) 26(H)  Creatinine 0.61 - 1.24 mg/dL 1.60(H) 1.80(H) 1.58(H)  Sodium 135 - 145 mmol/L 142 140 140  Potassium 3.5 - 5.1 mmol/L 4.3 4.2 4.5  Chloride 98 - 111 mmol/L 110 111 109  CO2 22 - 32 mmol/L 22 21(L) 24  Calcium 8.9 - 10.3 mg/dL 9.2 8.9 9.3  Total Protein 6.5 - 8.1 g/dL 7.0 7.1 7.2  Total Bilirubin 0.3 - 1.2 mg/dL 0.5 0.4 0.6  Alkaline Phos 38 - 126 U/L 68 72 66  AST 15 - 41 U/L 12(L) 14(L) 13(L)  ALT 0 - 44 U/L 13 14 10    03/09/20 ([id number]) Bone Marrow Biopsy at [facility]   07/02/2019 Bone Marrow Biopsy ([id number]) at [facility]    08/07/18 BM Bx:       RADIOGRAPHIC STUDIES: I have personally reviewed the radiological images as listed and agreed with the findings in the report. No results found.  ASSESSMENT & PLAN:   64 y.o. 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   Initial BM Bx showed >95% involvement with kappa restricted serum free light chains. 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 MoL Cy-   08/07/18 BM Bx revealed normocellular bone  marrow with trilineage hematopoiesis and 1% plasma cells which indicates the pt is in remission 08/06/18 PET/CT revealed  Expansile lytic lesion  involving the left iliac bone, unchanged. Lytic lesion involving the sternum, unchanged. While both are suspicious for myeloma, neither lesion is FDG avid. No hypermetabolic osseous lesions in the visualized axial and appendicular skeleton. Please note that the bilateral lower extremities were not imaged. No suspicious lymphadenopathy. Spleen is normal in size.    Pt began Ninlaro 76m maintenance after pt finished 13 cycles of CyBorD and BM bx showed only 1% plasma cells, then resumed CyBorD and held Ninlaro in anticipation of his tandem bone marrow [facility] late December 2019 [facility]  11/19/18 High dose chemotherapy with Melphalan 1457mm2 and autologous stem cell rescue  03/20/19 High dose chemotherapy with Melphalan 20010m2 and autologous stem cell rescue. Non-infectious diarrhea, uncomplicated course otherwise.  07/02/2019 PET scan with results revealing "1. No FDG avid bone lesions are identified. 2.  Asymmetric uptake within the right, greater than left, palatine tonsils with mild fullness of the right tonsillar soft tissue. ENT consult vs attention on follow up suggested. 3.  Innumerable mixed sclerotic and lytic lesions throughout the axial and appendicular skeleton without associated hypermetabolic activity. 4.  Ancillary CT findings as above."  07/02/2019 bone marrow biopsy ([id number])with results showing: "Trilineage hematopoieses with maturation. No plasma cell myeloma identified. Mild anemia and No Rouleaux or circulating plasma cells identified in the peripheral blood."  2) s/p Prophylactic IM nailing for rt femur  [facility] previously reported improvement and is currently progressively back to full time work.  3) Thrombocytopenia resolved   4) Renal insufficiency -- likely primarily related to Myeloma. stable Plan -maintain follow up with nephrology referral to Dr ColMarval RegalaNarda Amberidney for continued treatment -Pt completed 8 weeks of maverick for hep c with Dr ComLinus Salmons 07/08/18.  -Kidney numbers improved. Creatinine in the 1.7-2.2 range   5) h/o tobacco abuse-counseled on smoking cessation -Currently not smoking.  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 -Hgb improved/stable   -monitor  8) recent RSV bronchitids about 1 month ago  PLAN: -Discussed pt labwork today, 10/29/20; blood counts and chemistries are stable, K/L light chains are in progress. -Discussed 10/01/2020 M Protein is not observed, K/L light chain ratio at 1.78. -No lab or clinical evidence of Multiple Myeloma progression at this time. -The pt has no prohibitive toxicities from continuing Carfilzomib (C14D1) every two weeks at this time. -The pt has no prohibitive toxicities from continuing 10 mg Revlimid 3 weeks on, 1 week off at this time. -Continue daily ASA and Vitamin D -Continue Pamidronate q8weeks -Will see back in 4 weeks with labs   FOLLOW UP: -Plz schedule next 6 doses of Carfilzomib with portflush and labs every 2 weeks (next treatment in 2 weeks 12/3- do not see that scheduled) -MD visit in 4 weeks   The total time spent in the appt was 20 minutes and more than 50% was on counseling and direct patient cares.  All of the patient's questions were answered with apparent satisfaction. The patient knows to call the clinic with any problems, questions or concerns.   GauSullivan Lone MS EmigrantHIVMS SCHLong Island Jewish [facility]urg[facility] LLCmatology/Oncology Physician ConMorton County HospitalOffice):       [phone] cell):  [phone]):  [phone]  I, Yevette Edwards, am acting as a scribe for Dr. Sullivan Lone.   .I have reviewed the above documentation for accuracy and completeness, and I agree with the above. Brunetta Genera MD

## 2020-10-29 NOTE — Progress Notes (Signed)
OK to treat with scr 1.6 per Dr Irene Limbo

## 2020-11-01 LAB — KAPPA/LAMBDA LIGHT CHAINS
Kappa free light chain: 24.4 mg/L — ABNORMAL HIGH (ref 3.3–19.4)
Kappa, lambda light chain ratio: 1.46 (ref 0.26–1.65)
Lambda free light chains: 16.7 mg/L (ref 5.7–26.3)

## 2020-11-11 ENCOUNTER — Ambulatory Visit: Payer: BC Managed Care – PPO

## 2020-11-11 ENCOUNTER — Telehealth: Payer: Self-pay | Admitting: *Deleted

## 2020-11-11 ENCOUNTER — Other Ambulatory Visit: Payer: BC Managed Care – PPO

## 2020-11-11 NOTE — Telephone Encounter (Signed)
Patient had treatment day added for 12/3 per LOS dated 11/19 (at your request). Patient was not informed the day was added and did not ask for day off from work or arrange transportation. Patient had to cancel appt for 12/3 Kyprolis (Day15,Cycle 14) since he can't get off work. His next scheduled treatment is on 12/17 (Day 1,Cycle15). He wants to know if he needs to have a treatment scheduled before that - and if so, will his treatment schedule be completely changed. Dr. Irene Limbo informed of patient question.

## 2020-11-12 ENCOUNTER — Inpatient Hospital Stay: Payer: BC Managed Care – PPO

## 2020-11-12 ENCOUNTER — Telehealth: Payer: Self-pay | Admitting: Hematology

## 2020-11-12 NOTE — Telephone Encounter (Signed)
Scheduled per 11/19 and rescheduled cancelled 12/03 appointments, patient has been called and notified of upcoming appointments.

## 2020-11-16 ENCOUNTER — Other Ambulatory Visit: Payer: Self-pay | Admitting: *Deleted

## 2020-11-16 DIAGNOSIS — C9010 Plasma cell leukemia not having achieved remission: Secondary | ICD-10-CM

## 2020-11-16 MED ORDER — LENALIDOMIDE 10 MG PO CAPS: 10.0000 mg | ORAL_CAPSULE | Freq: Every day | ORAL | 0 refills | Status: DC

## 2020-11-19 ENCOUNTER — Other Ambulatory Visit: Payer: Self-pay

## 2020-11-19 ENCOUNTER — Other Ambulatory Visit: Payer: BC Managed Care – PPO

## 2020-11-19 ENCOUNTER — Inpatient Hospital Stay: Payer: BC Managed Care – PPO | Attending: Hematology

## 2020-11-19 ENCOUNTER — Ambulatory Visit: Payer: BC Managed Care – PPO

## 2020-11-19 ENCOUNTER — Telehealth: Payer: Self-pay

## 2020-11-19 ENCOUNTER — Inpatient Hospital Stay: Payer: BC Managed Care – PPO

## 2020-11-19 VITALS — BP 128/84 | HR 78 | Temp 97.8°F | Resp 18 | Wt 153.8 lb

## 2020-11-19 DIAGNOSIS — I10 Essential (primary) hypertension: Secondary | ICD-10-CM | POA: Diagnosis not present

## 2020-11-19 DIAGNOSIS — C9010 Plasma cell leukemia not having achieved remission: Secondary | ICD-10-CM | POA: Insufficient documentation

## 2020-11-19 DIAGNOSIS — N400 Enlarged prostate without lower urinary tract symptoms: Secondary | ICD-10-CM | POA: Diagnosis not present

## 2020-11-19 DIAGNOSIS — Z79899 Other long term (current) drug therapy: Secondary | ICD-10-CM | POA: Diagnosis not present

## 2020-11-19 DIAGNOSIS — B1920 Unspecified viral hepatitis C without hepatic coma: Secondary | ICD-10-CM | POA: Diagnosis not present

## 2020-11-19 DIAGNOSIS — F17210 Nicotine dependence, cigarettes, uncomplicated: Secondary | ICD-10-CM | POA: Insufficient documentation

## 2020-11-19 DIAGNOSIS — Z7982 Long term (current) use of aspirin: Secondary | ICD-10-CM | POA: Insufficient documentation

## 2020-11-19 DIAGNOSIS — Z5111 Encounter for antineoplastic chemotherapy: Secondary | ICD-10-CM | POA: Diagnosis not present

## 2020-11-19 DIAGNOSIS — C9011 Plasma cell leukemia in remission: Secondary | ICD-10-CM

## 2020-11-19 DIAGNOSIS — Z7189 Other specified counseling: Secondary | ICD-10-CM

## 2020-11-19 LAB — CMP (CANCER CENTER ONLY)
ALT: 13 U/L (ref 0–44)
AST: 14 U/L — ABNORMAL LOW (ref 15–41)
Albumin: 3.9 g/dL (ref 3.5–5.0)
Alkaline Phosphatase: 74 U/L (ref 38–126)
Anion gap: 12 (ref 5–15)
BUN: 31 mg/dL — ABNORMAL HIGH (ref 8–23)
CO2: 20 mmol/L — ABNORMAL LOW (ref 22–32)
Calcium: 9.2 mg/dL (ref 8.9–10.3)
Chloride: 110 mmol/L (ref 98–111)
Creatinine: 1.87 mg/dL — ABNORMAL HIGH (ref 0.61–1.24)
GFR, Estimated: 40 mL/min — ABNORMAL LOW (ref >60)
Glucose, Bld: 118 mg/dL — ABNORMAL HIGH (ref 70–99)
Potassium: 3.8 mmol/L (ref 3.5–5.1)
Sodium: 142 mmol/L (ref 135–145)
Total Bilirubin: 0.5 mg/dL (ref 0.3–1.2)
Total Protein: 7.3 g/dL (ref 6.5–8.1)

## 2020-11-19 LAB — CBC WITH DIFFERENTIAL/PLATELET
Abs Immature Granulocytes: 0.02 10*3/uL (ref 0.00–0.07)
Basophils Absolute: 0.1 10*3/uL (ref 0.0–0.1)
Basophils Relative: 2 %
Eosinophils Absolute: 0.2 10*3/uL (ref 0.0–0.5)
Eosinophils Relative: 4 %
HCT: 39 % (ref 39.0–52.0)
Hemoglobin: 13.4 g/dL (ref 13.0–17.0)
Immature Granulocytes: 0 %
Lymphocytes Relative: 31 %
Lymphs Abs: 1.7 10*3/uL (ref 0.7–4.0)
MCH: 32.1 pg (ref 26.0–34.0)
MCHC: 34.4 g/dL (ref 30.0–36.0)
MCV: 93.5 fL (ref 80.0–100.0)
Monocytes Absolute: 0.5 10*3/uL (ref 0.1–1.0)
Monocytes Relative: 8 %
Neutro Abs: 3.1 10*3/uL (ref 1.7–7.7)
Neutrophils Relative %: 55 %
Platelets: 203 10*3/uL (ref 150–400)
RBC: 4.17 MIL/uL — ABNORMAL LOW (ref 4.22–5.81)
RDW: 13.5 % (ref 11.5–15.5)
WBC: 5.5 10*3/uL (ref 4.0–10.5)
nRBC: 0 % (ref 0.0–0.2)

## 2020-11-19 MED ORDER — ACETAMINOPHEN 325 MG PO TABS
ORAL_TABLET | ORAL | Status: AC
  Filled 2020-11-19: qty 2

## 2020-11-19 MED ORDER — DEXTROSE 5 % IV SOLN
56.0000 mg/m2 | Freq: Once | INTRAVENOUS | Status: AC
  Administered 2020-11-19: 100 mg via INTRAVENOUS
  Filled 2020-11-19: qty 30

## 2020-11-19 MED ORDER — FAMOTIDINE 20 MG PO TABS
ORAL_TABLET | ORAL | Status: AC
  Filled 2020-11-19: qty 1

## 2020-11-19 MED ORDER — FAMOTIDINE IN NACL 20-0.9 MG/50ML-% IV SOLN
INTRAVENOUS | Status: AC
  Filled 2020-11-19: qty 50

## 2020-11-19 MED ORDER — ACETAMINOPHEN 325 MG PO TABS
650.0000 mg | ORAL_TABLET | Freq: Once | ORAL | Status: AC
  Administered 2020-11-19: 650 mg via ORAL

## 2020-11-19 MED ORDER — SODIUM CHLORIDE 0.9 % IV SOLN
Freq: Once | INTRAVENOUS | Status: DC
  Filled 2020-11-19: qty 250

## 2020-11-19 MED ORDER — SODIUM CHLORIDE 0.9 % IV SOLN
20.0000 mg | Freq: Once | INTRAVENOUS | Status: AC
  Administered 2020-11-19: 20 mg via INTRAVENOUS
  Filled 2020-11-19: qty 20

## 2020-11-19 MED ORDER — SODIUM CHLORIDE 0.9 % IV SOLN
INTRAVENOUS | Status: AC
  Filled 2020-11-19: qty 250

## 2020-11-19 MED ORDER — SODIUM CHLORIDE 0.9 % IV SOLN
Freq: Once | INTRAVENOUS | Status: AC
  Filled 2020-11-19: qty 250

## 2020-11-19 MED ORDER — FAMOTIDINE 20 MG PO TABS
20.0000 mg | ORAL_TABLET | Freq: Once | ORAL | Status: AC
Start: 2020-11-19 — End: 2020-11-19
  Administered 2020-11-19: 20 mg via ORAL

## 2020-11-19 NOTE — Telephone Encounter (Signed)
Patient stable upon discharge. Ambulatory to lobby. Steady gait. No assistance needed.

## 2020-11-19 NOTE — Progress Notes (Signed)
Per Dr. Irene Limbo it is ok to treat with Kyprolis today with creatinine 1.87

## 2020-11-19 NOTE — Patient Instructions (Signed)
[facility] Discharge Instructions for Patients Receiving Chemotherapy  Today you received the following chemotherapy agents:  Kyprolis  To help prevent nausea and vomiting after your treatment, we encourage you to take your nausea medication as directed.   If you develop nausea and vomiting that is not controlled by your nausea medication, call the clinic.   BELOW ARE SYMPTOMS THAT SHOULD BE REPORTED IMMEDIATELY:  *FEVER GREATER THAN 100.5 F  *CHILLS WITH OR WITHOUT FEVER  NAUSEA AND VOMITING THAT IS NOT CONTROLLED WITH YOUR NAUSEA MEDICATION  *UNUSUAL SHORTNESS OF BREATH  *UNUSUAL BRUISING OR BLEEDING  TENDERNESS IN MOUTH AND THROAT WITH OR WITHOUT PRESENCE OF ULCERS  *URINARY PROBLEMS  *BOWEL PROBLEMS  UNUSUAL RASH Items with * indicate a potential emergency and should be followed up as soon as possible.  Feel free to call the clinic should you have any questions or concerns. The clinic phone number is [phone].  Please show the CHEMO ALERT CARD at check-in to the Emergency Department and triage nurse.   

## 2020-11-25 NOTE — Progress Notes (Signed)
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 His labs today 12/14/2017 show hgb 8.3, platelets 524k. He is doing well overall. He has staples in his right upper leg following a recent surgery and has not been in touch with the surgeons for a f/u of this. He is not receiving physical therapy and is not getting around at home.  He notes that he was not scheduled for a post hospitalization nephrology f/u and has no PCP.  On review of systems, pt reports intermittent fatigue, SOB, pain to the left leg, dizziness, dry mouth and denies fever, chills, night sweats, dysuria and any other accompanying symptoms.   INTERVAL HISTORY:    Andres Fernandez presents today for follow up and continue management of his myeloma/plasma cell leukemia. The patient's last visit with [name] was on 10/29/2020. The pt reports that he is doing well overall.  The pt reports that he has felt well since our last visit. He denies any new issues with treatment at this time. Pt has received his COVID19 booster and is still taking safety precautions at work.   Lab results today (11/26/20) of CBC w/diff and CMP is as follows: all values are WNL except for RBC at 4.21, BUN at 31, Creatinine at 1.76, AST at 12, GFR Est at 43. 11/26/2020 MMP is in progress 11/26/2020 K/L light chains is in progress  On review of systems, pt denies fevers, low appetite, sleeplessness, cramps, unexpected weight loss and any other symptoms.    REVIEW OF SYSTEMS:   A 10+ POINT REVIEW OF SYSTEMS WAS OBTAINED including neurology, dermatology, psychiatry, cardiac, respiratory, lymph, extremities, GI, GU, Musculoskeletal, constitutional, breasts, reproductive, HEENT.  All pertinent positives are noted in the  HPI.  All others are negative.   MEDICAL HISTORY:  Past Medical History:  Diagnosis Date  . BPH (benign prostatic hyperplasia)   . Hepatitis C 11/27/2017  . Hypertension   . Plasma cell leukemia ([place]) 11/23/2017  . Tobacco abuse     SURGICAL HISTORY: Past Surgical History:  Procedure Laterality Date  . APPENDECTOMY    . FEMUR IM NAIL Right 11/20/2017   Procedure: RIGHT FEMORAL INTRAMEDULLARY (IM) NAIL;  Surgeon: Nicholes Stairs, MD;  Location: [facility];  Service: Orthopedics;  Laterality: Right;    SOCIAL HISTORY: Social History   Socioeconomic History  . Marital status: Single    Spouse name: Not on file  . Number of children: Not on file  . Years of education: Not on file  . Highest education level: Not on file  Occupational History  . Not on file  Tobacco Use  . Smoking status: Current Every Day Smoker    Packs/day: 0.25    Types: Cigarettes    Last attempt to quit: 11/09/2017    Years since quitting: 3.0  . Smokeless tobacco: Never Used  . Tobacco comment: 1 cigarette/day as of 09/25/18  Vaping Use  . Vaping Use: Never used  Substance and Sexual Activity  . Alcohol use: Yes  . Drug use: No  . Sexual activity: Not on file  Other Topics Concern  . Not on file  Social History Narrative  . Not on file   Social Determinants of Health   Financial Resource Strain: Not on file  Food  Insecurity: Not on file  Transportation Needs: Not on file  Physical Activity: Not on file  Stress: Not on file  Social Connections: Not on file  Intimate Partner Violence: Not on file    FAMILY HISTORY: Family History  Problem Relation Age of Onset  . COPD Mother   . Liver cancer Mother     ALLERGIES:  has No Known Allergies.  MEDICATIONS:  . Current Outpatient Medications on File Prior to Visit  Medication Sig Dispense Refill  . acyclovir (ZOVIRAX) 400 MG tablet Take 1 tablet (400 mg total) by mouth daily. (Patient taking differently: Take 400 mg by mouth at  bedtime. ) 30 tablet 11  . Albuterol Sulfate (PROAIR RESPICLICK) 884 (90 Base) MCG/ACT AEPB Inhale into the lungs.    [name] aspirin EC 81 MG tablet Take 1 tablet (81 mg total) by mouth daily. (Patient taking differently: Take 81 mg by mouth at bedtime. ) 60 tablet 11  . cholecalciferol (VITAMIN D) 400 units TABS tablet Take 400 Units by mouth at bedtime.     [name] lenalidomide (REVLIMID) 10 MG capsule Take 1 capsule (10 mg total) by mouth daily. Take for 21 days on, 7 days off, repeat every 28 days. 21 capsule 0  . Multiple Vitamins-Minerals (MULTIVITAMIN WITH MINERALS) tablet Take 1 tablet by mouth at bedtime.     . nicotine (NICODERM CQ - DOSED IN MG/24 HOURS) 14 mg/24hr patch PLACE 1 PATCH (14 MG TOTAL) ONTO THE SKIN DAILY. (Patient taking differently: Place 14 mg onto the skin daily. ) 28 patch 1  . ondansetron (ZOFRAN) 4 MG tablet Take 2 tablets (8 mg total) by mouth every 8 (eight) hours as needed for nausea. 30 tablet 0  . tiZANidine (ZANAFLEX) 2 MG tablet Take 1 tablet (2 mg total) by mouth every 8 (eight) hours as needed for muscle spasms. 30 tablet 0  . Vitamin D, Ergocalciferol, (DRISDOL) 1.25 MG (50000 UNIT) CAPS capsule Take 1 capsule (50,000 Units total) by mouth once a week. (Patient taking differently: Take 50,000 Units by mouth every Thursday. ) 12 capsule 6   Current Facility-Administered Medications on File Prior to Visit  Medication Dose Route Frequency Provider Last Rate Last Admin  . 0.9 %  sodium chloride infusion   Intravenous Once Brunetta Genera, MD      . 0.9 %  sodium chloride infusion   Intravenous Once Brunetta Genera, MD      . 0.9 %  sodium chloride infusion   Intravenous Continuous Brunetta Genera, MD 500 mL/hr at 11/26/20 0939 New Bag at 11/26/20 0939  . carfilzomib (KYPROLIS) 100 mg in dextrose 5 % 100 mL chemo infusion  56 mg/m2 (Treatment Plan Recorded) Intravenous Once Brunetta Genera, MD      . dexamethasone (DECADRON) 20 mg in sodium chloride 0.9 %  50 mL IVPB  20 mg Intravenous Once Brunetta Genera, MD        PHYSICAL EXAMINATION:  ECOG FS:1 - Symptomatic but completely ambulatory  There were no vitals filed for this visit. Wt Readings from Last 3 Encounters:  11/26/20 154 lb 12.8 oz (70.2 kg)  11/19/20 153 lb 12 oz (69.7 kg)  10/15/20 159 lb (72.1 kg)   There is no height or weight on file to calculate BMI.    Exam was given in a chair   GENERAL:alert, in no acute distress and comfortable SKIN: no acute rashes, no significant lesions EYES: conjunctiva are pink and non-injected, sclera anicteric OROPHARYNX: MMM, no  exudates, no oropharyngeal erythema or ulceration NECK: supple, no JVD LYMPH:  no palpable lymphadenopathy in the cervical, axillary or inguinal regions LUNGS: clear to auscultation b/l with normal respiratory effort HEART: regular rate & rhythm ABDOMEN:  normoactive bowel sounds , non tender, not distended. No palpable hepatosplenomegaly.  Extremity: no pedal edema PSYCH: alert & oriented x 3 with fluent speech NEURO: no focal motor/sensory deficits  LABORATORY DATA:  I have reviewed the data as listed  . CBC Latest Ref Rng & Units 11/26/2020 11/19/2020 10/29/2020  WBC 4.0 - 10.5 K/uL 6.7 5.5 6.9  Hemoglobin 13.0 - 17.0 g/dL 13.4 13.4 12.7(L)  Hematocrit 39.0 - 52.0 % 39.2 39.0 37.3(L)  Platelets 150 - 400 K/uL 198 203 244   . CBC    Component Value Date/Time   WBC 6.7 11/26/2020 0805   RBC 4.21 (L) 11/26/2020 0805   HGB 13.4 11/26/2020 0805   HGB 11.9 (L) 08/27/2019 0922   HGB 8.3 (L) 12/14/2017 1230   HCT 39.2 11/26/2020 0805   HCT 23.9 (L) 12/14/2017 1230   PLT 198 11/26/2020 0805   PLT 203 08/27/2019 0922   PLT 524 (H) 12/14/2017 1230   MCV 93.1 11/26/2020 0805   MCV 88.4 12/14/2017 1230   MCH 31.8 11/26/2020 0805   MCHC 34.2 11/26/2020 0805   RDW 13.6 11/26/2020 0805   RDW 15.3 (H) 12/14/2017 1230   LYMPHSABS 1.5 11/26/2020 0805   LYMPHSABS 1.4 12/14/2017 1230   MONOABS 0.8  11/26/2020 0805   MONOABS 0.6 12/14/2017 1230   EOSABS 0.2 11/26/2020 0805   EOSABS 0.1 12/14/2017 1230   BASOSABS 0.1 11/26/2020 0805   BASOSABS 0.1 12/14/2017 1230     . CMP Latest Ref Rng & Units 11/26/2020 11/19/2020 10/29/2020  Glucose 70 - 99 mg/dL 79 118(H) 88  BUN 8 - 23 mg/dL 31(H) 31(H) 28(H)  Creatinine 0.61 - 1.24 mg/dL 1.76(H) 1.87(H) 1.60(H)  Sodium 135 - 145 mmol/L 141 142 142  Potassium 3.5 - 5.1 mmol/L 3.9 3.8 4.3  Chloride 98 - 111 mmol/L 109 110 110  CO2 22 - 32 mmol/L 22 20(L) 22  Calcium 8.9 - 10.3 mg/dL 9.4 9.2 9.2  Total Protein 6.5 - 8.1 g/dL 7.5 7.3 7.0  Total Bilirubin 0.3 - 1.2 mg/dL 0.5 0.5 0.5  Alkaline Phos 38 - 126 U/L 70 74 68  AST 15 - 41 U/L 12(L) 14(L) 12(L)  ALT 0 - 44 U/L 14 13 13    03/09/20 ([id number]) [facility] Marrow Bi[facility] at [facility]   07/02/2019 Bone Marrow Biopsy ([id number]) at [facility]    08/07/18 BM Bx:       RADIOGRAPHIC STUDIES: I have personally reviewed the radiological images as listed and agreed with the findings in the report. No results found.  ASSESSMENT & PLAN:   64 y.o. 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   Initial BM Bx showed >95% involvement with kappa restricted serum free light chains. 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 MoL Cy-   08/07/18 BM Bx revealed normocellular bone marrow with trilineage hematopoiesis and 1% plasma cells which indicates the pt is in remission 08/06/18 PET/CT revealed  Expansile lytic lesion involving the left iliac bone, unchanged. Lytic lesion involving the  sternum, unchanged. While both are suspicious for myeloma, neither lesion is FDG avid. No  hypermetabolic osseous lesions in the visualized axial and appendicular skeleton. Please note that the bilateral lower extremities were not imaged. No suspicious lymphadenopathy. Spleen is normal in size.    Pt began Ninlaro 59m maintenance after pt finished 13 cycles of CyBorD and BM bx showed only 1% plasma cells, then resumed CyBorD and held Ninlaro in anticipation of his tandem bone marrow [facility] late December 2019 [facility]  11/19/18 High dose chemotherapy with Melphalan 1486mm2 and autologous stem cell rescue  03/20/19 High dose chemotherapy with Melphalan 20019m2 and autologous stem cell rescue. Non-infectious diarrhea, uncomplicated course otherwise.  07/02/2019 PET scan with results revealing "1. No FDG avid bone lesions are identified. 2.  Asymmetric uptake within the right, greater than left, palatine tonsils with mild fullness of the right tonsillar soft tissue. ENT consult vs attention on follow up suggested. 3.  Innumerable mixed sclerotic and lytic lesions throughout the axial and appendicular skeleton without associated hypermetabolic activity. 4.  Ancillary CT findings as above."  07/02/2019 bone marrow biopsy ([id number])with results showing: "Trilineage hematopoieses with maturation. No plasma cell myeloma identified. Mild anemia and No Rouleaux or circulating plasma cells identified in the peripheral blood."  2) s/p Prophylactic IM nailing for rt femur  [facility] and is currently progressively back to full time work.  3) Thrombocytopenia resolved   4) Renal insufficiency -- likely primarily related to Myeloma. stable Plan -maintain follow up with nephrology referral to Dr ColMarval RegalaNarda Amberdney for continued treatment -Pt completed 8 weeks of maverick for hep c with Dr ComLinus Salmons 07/08/18.  -Kidney numbers improved. Creatinine in the 1.7-2.2 range    5) h/o tobacco abuse-counseled on smoking cessation -Currently not smoking.  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 -Hgb improved/stable   -monitor  PLAN: -Discussed pt labwork today, 11/26/20; blood count are nml, blood chemistries are stable, MMP & K/L light chains are in progress. -Discussed 10/29/2020 K/L light chain ratio is WNL. No lab or clinical evidence of Multiple Myeloma progression at this time. -The pt has no prohibitive toxicities from continuing Carfilzomib (C15D1) every two weeks at this time. -The pt has no prohibitive toxicities from continuing 10 mg Revlimid 3 weeks on, 1 week off at this time. -Continue daily ASA and Vitamin D -Continue Pamidronate q8weeks -Will see back in 6 weeks with labs   FOLLOW UP: -Plz schedule next 6 doses of Carfilzomib with portflush and labs every 2 weeks as ordered -MD visit in 6 weeks   The total time spent in the appt was 20 minutes and more than 50% was on counseling and direct patient cares.  All of the patient's questions were answered with apparent satisfaction. The patient knows to call the clinic with any problems, questions or concerns.   GauSullivan Lone MS BoveyHIVMS SCHAnamosa Community HospitalHBrooks Rehabilitation Hospitalmatology/Oncology Physician ConMyrtue Memorial HospitalOffice):       [phone] cell):  [phone]):           [phone], JazYevette Edwardsm acting as a scribe for Dr. GauSullivan Lone .I have reviewed the above documentation for accuracy and completeness, and I agree with the above. .GaBrunetta Genera

## 2020-11-26 ENCOUNTER — Other Ambulatory Visit: Payer: Self-pay

## 2020-11-26 ENCOUNTER — Other Ambulatory Visit: Payer: BC Managed Care – PPO

## 2020-11-26 ENCOUNTER — Inpatient Hospital Stay: Payer: BC Managed Care – PPO

## 2020-11-26 ENCOUNTER — Ambulatory Visit: Payer: BC Managed Care – PPO

## 2020-11-26 ENCOUNTER — Inpatient Hospital Stay (HOSPITAL_BASED_OUTPATIENT_CLINIC_OR_DEPARTMENT_OTHER): Payer: BC Managed Care – PPO | Admitting: Hematology

## 2020-11-26 VITALS — BP 129/68 | HR 77 | Temp 97.9°F | Resp 16 | Wt 154.8 lb

## 2020-11-26 DIAGNOSIS — Z5111 Encounter for antineoplastic chemotherapy: Secondary | ICD-10-CM

## 2020-11-26 DIAGNOSIS — C9010 Plasma cell leukemia not having achieved remission: Secondary | ICD-10-CM

## 2020-11-26 DIAGNOSIS — Z9484 Stem cells transplant status: Secondary | ICD-10-CM

## 2020-11-26 DIAGNOSIS — C9011 Plasma cell leukemia in remission: Secondary | ICD-10-CM

## 2020-11-26 DIAGNOSIS — Z7189 Other specified counseling: Secondary | ICD-10-CM

## 2020-11-26 LAB — CBC WITH DIFFERENTIAL/PLATELET
Abs Immature Granulocytes: 0.03 10*3/uL (ref 0.00–0.07)
Basophils Absolute: 0.1 10*3/uL (ref 0.0–0.1)
Basophils Relative: 2 %
Eosinophils Absolute: 0.2 10*3/uL (ref 0.0–0.5)
Eosinophils Relative: 3 %
HCT: 39.2 % (ref 39.0–52.0)
Hemoglobin: 13.4 g/dL (ref 13.0–17.0)
Immature Granulocytes: 0 %
Lymphocytes Relative: 22 %
Lymphs Abs: 1.5 10*3/uL (ref 0.7–4.0)
MCH: 31.8 pg (ref 26.0–34.0)
MCHC: 34.2 g/dL (ref 30.0–36.0)
MCV: 93.1 fL (ref 80.0–100.0)
Monocytes Absolute: 0.8 10*3/uL (ref 0.1–1.0)
Monocytes Relative: 11 %
Neutro Abs: 4.1 10*3/uL (ref 1.7–7.7)
Neutrophils Relative %: 62 %
Platelets: 198 10*3/uL (ref 150–400)
RBC: 4.21 MIL/uL — ABNORMAL LOW (ref 4.22–5.81)
RDW: 13.6 % (ref 11.5–15.5)
WBC: 6.7 10*3/uL (ref 4.0–10.5)
nRBC: 0 % (ref 0.0–0.2)

## 2020-11-26 LAB — CMP (CANCER CENTER ONLY)
ALT: 14 U/L (ref 0–44)
AST: 12 U/L — ABNORMAL LOW (ref 15–41)
Albumin: 4 g/dL (ref 3.5–5.0)
Alkaline Phosphatase: 70 U/L (ref 38–126)
Anion gap: 10 (ref 5–15)
BUN: 31 mg/dL — ABNORMAL HIGH (ref 8–23)
CO2: 22 mmol/L (ref 22–32)
Calcium: 9.4 mg/dL (ref 8.9–10.3)
Chloride: 109 mmol/L (ref 98–111)
Creatinine: 1.76 mg/dL — ABNORMAL HIGH (ref 0.61–1.24)
GFR, Estimated: 43 mL/min — ABNORMAL LOW (ref >60)
Glucose, Bld: 79 mg/dL (ref 70–99)
Potassium: 3.9 mmol/L (ref 3.5–5.1)
Sodium: 141 mmol/L (ref 135–145)
Total Bilirubin: 0.5 mg/dL (ref 0.3–1.2)
Total Protein: 7.5 g/dL (ref 6.5–8.1)

## 2020-11-26 MED ORDER — ACETAMINOPHEN 325 MG PO TABS
ORAL_TABLET | ORAL | Status: AC
  Filled 2020-11-26: qty 2

## 2020-11-26 MED ORDER — SODIUM CHLORIDE 0.9 % IV SOLN
Freq: Once | INTRAVENOUS | Status: DC
  Filled 2020-11-26: qty 250

## 2020-11-26 MED ORDER — ACETAMINOPHEN 325 MG PO TABS
650.0000 mg | ORAL_TABLET | Freq: Once | ORAL | Status: AC
  Administered 2020-11-26: 650 mg via ORAL

## 2020-11-26 MED ORDER — SODIUM CHLORIDE 0.9 % IV SOLN
INTRAVENOUS | Status: DC
  Filled 2020-11-26: qty 250

## 2020-11-26 MED ORDER — SODIUM CHLORIDE 0.9 % IV SOLN
20.0000 mg | Freq: Once | INTRAVENOUS | Status: AC
  Administered 2020-11-26: 20 mg via INTRAVENOUS
  Filled 2020-11-26: qty 20

## 2020-11-26 MED ORDER — SODIUM CHLORIDE 0.9 % IV SOLN
Freq: Once | INTRAVENOUS | Status: AC
  Filled 2020-11-26: qty 250

## 2020-11-26 MED ORDER — FAMOTIDINE 20 MG PO TABS
ORAL_TABLET | ORAL | Status: AC
  Filled 2020-11-26: qty 1

## 2020-11-26 MED ORDER — SODIUM CHLORIDE 0.9 % IV SOLN
INTRAVENOUS | Status: AC
  Filled 2020-11-26 (×2): qty 250

## 2020-11-26 MED ORDER — FAMOTIDINE 20 MG PO TABS
20.0000 mg | ORAL_TABLET | Freq: Once | ORAL | Status: AC
  Administered 2020-11-26: 20 mg via ORAL

## 2020-11-26 MED ORDER — DEXTROSE 5 % IV SOLN
56.0000 mg/m2 | Freq: Once | INTRAVENOUS | Status: AC
  Administered 2020-11-26: 100 mg via INTRAVENOUS
  Filled 2020-11-26: qty 30

## 2020-11-26 MED ORDER — LORATADINE 10 MG PO TABS
ORAL_TABLET | ORAL | Status: AC
  Filled 2020-11-26: qty 1

## 2020-11-26 NOTE — Progress Notes (Signed)
Patient discharged in stable condition with no complaints 

## 2020-11-26 NOTE — Progress Notes (Signed)
Ok to treat with scr 1.76 per dr Irene Limbo

## 2020-11-26 NOTE — Patient Instructions (Signed)
[facility] Discharge Instructions for Patients Receiving Chemotherapy  Today you received the following chemotherapy agents:  Kyprolis  To help prevent nausea and vomiting after your treatment, we encourage you to take your nausea medication as directed.   If you develop nausea and vomiting that is not controlled by your nausea medication, call the clinic.   BELOW ARE SYMPTOMS THAT SHOULD BE REPORTED IMMEDIATELY:  *FEVER GREATER THAN 100.5 F  *CHILLS WITH OR WITHOUT FEVER  NAUSEA AND VOMITING THAT IS NOT CONTROLLED WITH YOUR NAUSEA MEDICATION  *UNUSUAL SHORTNESS OF BREATH  *UNUSUAL BRUISING OR BLEEDING  TENDERNESS IN MOUTH AND THROAT WITH OR WITHOUT PRESENCE OF ULCERS  *URINARY PROBLEMS  *BOWEL PROBLEMS  UNUSUAL RASH Items with * indicate a potential emergency and should be followed up as soon as possible.  Feel free to call the clinic should you have any questions or concerns. The clinic phone number is [phone].  Please show the CHEMO ALERT CARD at check-in to the Emergency Department and triage nurse.   

## 2020-11-29 LAB — KAPPA/LAMBDA LIGHT CHAINS
Kappa free light chain: 22.1 mg/L — ABNORMAL HIGH (ref 3.3–19.4)
Kappa, lambda light chain ratio: 1.42 (ref 0.26–1.65)
Lambda free light chains: 15.6 mg/L (ref 5.7–26.3)

## 2020-11-29 LAB — MULTIPLE MYELOMA PANEL, SERUM
Albumin SerPl Elph-Mcnc: 3.8 g/dL (ref 2.9–4.4)
Albumin/Glob SerPl: 1.4 (ref 0.7–1.7)
Alpha 1: 0.2 g/dL (ref 0.0–0.4)
Alpha2 Glob SerPl Elph-Mcnc: 1.1 g/dL — ABNORMAL HIGH (ref 0.4–1.0)
B-Globulin SerPl Elph-Mcnc: 0.8 g/dL (ref 0.7–1.3)
Gamma Glob SerPl Elph-Mcnc: 0.7 g/dL (ref 0.4–1.8)
Globulin, Total: 2.8 g/dL (ref 2.2–3.9)
IgA: 67 mg/dL (ref 61–437)
IgG (Immunoglobin G), Serum: 787 mg/dL (ref 603–1613)
IgM (Immunoglobulin M), Srm: 11 mg/dL — ABNORMAL LOW (ref 20–172)
Total Protein ELP: 6.6 g/dL (ref 6.0–8.5)

## 2020-12-09 ENCOUNTER — Other Ambulatory Visit: Payer: Self-pay | Admitting: *Deleted

## 2020-12-09 ENCOUNTER — Ambulatory Visit: Payer: BC Managed Care – PPO

## 2020-12-09 ENCOUNTER — Other Ambulatory Visit: Payer: Self-pay

## 2020-12-09 ENCOUNTER — Other Ambulatory Visit: Payer: BC Managed Care – PPO

## 2020-12-09 ENCOUNTER — Inpatient Hospital Stay: Payer: BC Managed Care – PPO

## 2020-12-09 VITALS — BP 112/50 | HR 66 | Temp 98.6°F | Resp 16 | Wt 160.0 lb

## 2020-12-09 DIAGNOSIS — C9010 Plasma cell leukemia not having achieved remission: Secondary | ICD-10-CM | POA: Diagnosis not present

## 2020-12-09 DIAGNOSIS — C9011 Plasma cell leukemia in remission: Secondary | ICD-10-CM

## 2020-12-09 DIAGNOSIS — Z5111 Encounter for antineoplastic chemotherapy: Secondary | ICD-10-CM

## 2020-12-09 DIAGNOSIS — Z7189 Other specified counseling: Secondary | ICD-10-CM

## 2020-12-09 LAB — CBC WITH DIFFERENTIAL/PLATELET
Abs Immature Granulocytes: 0.03 10*3/uL (ref 0.00–0.07)
Basophils Absolute: 0.1 10*3/uL (ref 0.0–0.1)
Basophils Relative: 2 %
Eosinophils Absolute: 0.3 10*3/uL (ref 0.0–0.5)
Eosinophils Relative: 4 %
HCT: 38.2 % — ABNORMAL LOW (ref 39.0–52.0)
Hemoglobin: 13 g/dL (ref 13.0–17.0)
Immature Granulocytes: 0 %
Lymphocytes Relative: 24 %
Lymphs Abs: 1.7 10*3/uL (ref 0.7–4.0)
MCH: 31.6 pg (ref 26.0–34.0)
MCHC: 34 g/dL (ref 30.0–36.0)
MCV: 92.7 fL (ref 80.0–100.0)
Monocytes Absolute: 0.9 10*3/uL (ref 0.1–1.0)
Monocytes Relative: 14 %
Neutro Abs: 3.9 10*3/uL (ref 1.7–7.7)
Neutrophils Relative %: 56 %
Platelets: 193 10*3/uL (ref 150–400)
RBC: 4.12 MIL/uL — ABNORMAL LOW (ref 4.22–5.81)
RDW: 14.1 % (ref 11.5–15.5)
WBC: 6.9 10*3/uL (ref 4.0–10.5)
nRBC: 0 % (ref 0.0–0.2)

## 2020-12-09 LAB — CMP (CANCER CENTER ONLY)
ALT: 15 U/L (ref 0–44)
AST: 11 U/L — ABNORMAL LOW (ref 15–41)
Albumin: 3.7 g/dL (ref 3.5–5.0)
Alkaline Phosphatase: 69 U/L (ref 38–126)
Anion gap: 7 (ref 5–15)
BUN: 29 mg/dL — ABNORMAL HIGH (ref 8–23)
CO2: 24 mmol/L (ref 22–32)
Calcium: 9.1 mg/dL (ref 8.9–10.3)
Chloride: 111 mmol/L (ref 98–111)
Creatinine: 1.76 mg/dL — ABNORMAL HIGH (ref 0.61–1.24)
GFR, Estimated: 43 mL/min — ABNORMAL LOW (ref >60)
Glucose, Bld: 98 mg/dL (ref 70–99)
Potassium: 4.1 mmol/L (ref 3.5–5.1)
Sodium: 142 mmol/L (ref 135–145)
Total Bilirubin: 0.5 mg/dL (ref 0.3–1.2)
Total Protein: 7 g/dL (ref 6.5–8.1)

## 2020-12-09 MED ORDER — FAMOTIDINE 20 MG PO TABS
20.0000 mg | ORAL_TABLET | Freq: Once | ORAL | Status: AC
  Administered 2020-12-09: 20 mg via ORAL

## 2020-12-09 MED ORDER — FAMOTIDINE 20 MG PO TABS
ORAL_TABLET | ORAL | Status: AC
  Filled 2020-12-09: qty 1

## 2020-12-09 MED ORDER — SODIUM CHLORIDE 0.9 % IV SOLN
Freq: Once | INTRAVENOUS | Status: AC
  Filled 2020-12-09: qty 250

## 2020-12-09 MED ORDER — SODIUM CHLORIDE 0.9 % IV SOLN
INTRAVENOUS | Status: AC
  Filled 2020-12-09: qty 250

## 2020-12-09 MED ORDER — SODIUM CHLORIDE 0.9 % IV SOLN
20.0000 mg | Freq: Once | INTRAVENOUS | Status: AC
  Administered 2020-12-09: 20 mg via INTRAVENOUS
  Filled 2020-12-09: qty 20

## 2020-12-09 MED ORDER — FAMOTIDINE IN NACL 20-0.9 MG/50ML-% IV SOLN
INTRAVENOUS | Status: AC
  Filled 2020-12-09: qty 50

## 2020-12-09 MED ORDER — ACETAMINOPHEN 325 MG PO TABS
ORAL_TABLET | ORAL | Status: AC
  Filled 2020-12-09: qty 2

## 2020-12-09 MED ORDER — ACETAMINOPHEN 325 MG PO TABS
650.0000 mg | ORAL_TABLET | Freq: Once | ORAL | Status: AC
  Administered 2020-12-09: 650 mg via ORAL

## 2020-12-09 MED ORDER — LENALIDOMIDE 10 MG PO CAPS: 10.0000 mg | ORAL_CAPSULE | Freq: Every day | ORAL | 0 refills | Status: DC

## 2020-12-09 MED ORDER — SODIUM CHLORIDE 0.9 % IV SOLN
Freq: Once | INTRAVENOUS | Status: AC
Start: 2020-12-09 — End: 2020-12-09
  Filled 2020-12-09: qty 250

## 2020-12-09 MED ORDER — DEXTROSE 5 % IV SOLN
56.0000 mg/m2 | Freq: Once | INTRAVENOUS | Status: AC
  Administered 2020-12-09: 100 mg via INTRAVENOUS
  Filled 2020-12-09: qty 30

## 2020-12-09 NOTE — Progress Notes (Signed)
Per Dr Myna Hidalgo , it is okay to treat pt today with Kyprolis and creatinine of 1.76.

## 2020-12-09 NOTE — Patient Instructions (Addendum)
[facility] Discharge Instructions for Patients Receiving Chemotherapy  Today you received the following chemotherapy agents Kyprolis  To help prevent nausea and vomiting after your treatment, we encourage you to take your nausea medication as directed.  If you develop nausea and vomiting that is not controlled by your nausea medication, call the clinic.   BELOW ARE SYMPTOMS THAT SHOULD BE REPORTED IMMEDIATELY:  *FEVER GREATER THAN 100.5 F  *CHILLS WITH OR WITHOUT FEVER  NAUSEA AND VOMITING THAT IS NOT CONTROLLED WITH YOUR NAUSEA MEDICATION  *UNUSUAL SHORTNESS OF BREATH  *UNUSUAL BRUISING OR BLEEDING  TENDERNESS IN MOUTH AND THROAT WITH OR WITHOUT PRESENCE OF ULCERS  *URINARY PROBLEMS  *BOWEL PROBLEMS  UNUSUAL RASH Items with * indicate a potential emergency and should be followed up as soon as possible.  Feel free to call the clinic should you have any questions or concerns. The clinic phone number is [phone].  Please show the CHEMO ALERT CARD at check-in to the Emergency Department and triage nurse.  Thank you for choosing [facility] to provide your oncology and hematology care.   Should you have questions after your visit to the [facility] ([facility]), please contact this office at [phone] between 8:30 AM and 4:30 PM.  Voice mails left after 4:00 PM may not be returned until the following business day.  Calls received after 4:30 PM will be answered by an off-site Nurse Triage Line.    Prescription Refills:  Please have your pharmacy contact us directly for most prescription requests.  Contact the office directly for refills of narcotics (pain medications). Allow 48-72 hours for refills.  Appointments: Please contact the [facility] scheduling department [phone] for questions regarding [facility] appointment scheduling.  Contact the schedulers with any scheduling changes so that your appointment can be rescheduled in a timely  manner.   Central Scheduling for [place] [phone] - Call to schedule procedures such as PET scans, CT scans, MRI, Ultrasound, etc.  To afford each patient quality time with our providers, please arrive 30 minutes before your scheduled appointment time.  If you arrive late for your appointment, you may be asked to reschedule.  We strive to give you quality time with our providers, and arriving late affects you and other patients whose appointments are after yours. If you are a no show for multiple scheduled visits, you may be dismissed from the clinic at the providers discretion.     Resources: [facility] Social Workers 336-832-0950 for additional information on assistance programs or assistance connecting with community support programs   [place] DSS  336-641-3447: Information regarding food stamps, Medicaid, and utility assistance [place] Access [place] [phone]   [place] [employer]'s shared-ride transportation service for eligible riders who have a disability that prevents them from riding the fixed route bus.   Medicare Rights Center 800-333-4114 Helps people with Medicare understand their rights and benefits, navigate the Medicare system, and secure the quality healthcare they deserve American Cancer Society 800-227-2345 Assists patients locate various types of support and financial assistance Cancer Care: 1-800-813-HOPE (4673) Provides financial assistance, online support groups, medication/co-pay assistance.   Transportation Assistance for appointments at [facility]: Transportation Coordinator [phone]  Again, thank you for choosing [facility] for your care.      

## 2020-12-24 ENCOUNTER — Inpatient Hospital Stay: Payer: BC Managed Care – PPO

## 2020-12-24 ENCOUNTER — Inpatient Hospital Stay: Payer: BC Managed Care – PPO | Attending: Hematology

## 2020-12-24 ENCOUNTER — Other Ambulatory Visit: Payer: Self-pay

## 2020-12-24 VITALS — BP 123/79 | HR 72 | Temp 97.9°F | Resp 16 | Wt 164.0 lb

## 2020-12-24 DIAGNOSIS — C9000 Multiple myeloma not having achieved remission: Secondary | ICD-10-CM | POA: Insufficient documentation

## 2020-12-24 DIAGNOSIS — R0602 Shortness of breath: Secondary | ICD-10-CM | POA: Diagnosis not present

## 2020-12-24 DIAGNOSIS — Z5111 Encounter for antineoplastic chemotherapy: Secondary | ICD-10-CM

## 2020-12-24 DIAGNOSIS — R5383 Other fatigue: Secondary | ICD-10-CM | POA: Insufficient documentation

## 2020-12-24 DIAGNOSIS — C9010 Plasma cell leukemia not having achieved remission: Secondary | ICD-10-CM | POA: Insufficient documentation

## 2020-12-24 DIAGNOSIS — N289 Disorder of kidney and ureter, unspecified: Secondary | ICD-10-CM | POA: Diagnosis not present

## 2020-12-24 DIAGNOSIS — F17210 Nicotine dependence, cigarettes, uncomplicated: Secondary | ICD-10-CM | POA: Diagnosis not present

## 2020-12-24 DIAGNOSIS — Z79899 Other long term (current) drug therapy: Secondary | ICD-10-CM | POA: Insufficient documentation

## 2020-12-24 DIAGNOSIS — C9011 Plasma cell leukemia in remission: Secondary | ICD-10-CM

## 2020-12-24 DIAGNOSIS — Z7189 Other specified counseling: Secondary | ICD-10-CM

## 2020-12-24 LAB — CBC WITH DIFFERENTIAL/PLATELET
Abs Immature Granulocytes: 0.04 10*3/uL (ref 0.00–0.07)
Basophils Absolute: 0.2 10*3/uL — ABNORMAL HIGH (ref 0.0–0.1)
Basophils Relative: 2 %
Eosinophils Absolute: 0.2 10*3/uL (ref 0.0–0.5)
Eosinophils Relative: 3 %
HCT: 36.9 % — ABNORMAL LOW (ref 39.0–52.0)
Hemoglobin: 12.6 g/dL — ABNORMAL LOW (ref 13.0–17.0)
Immature Granulocytes: 1 %
Lymphocytes Relative: 28 %
Lymphs Abs: 1.9 10*3/uL (ref 0.7–4.0)
MCH: 31.5 pg (ref 26.0–34.0)
MCHC: 34.1 g/dL (ref 30.0–36.0)
MCV: 92.3 fL (ref 80.0–100.0)
Monocytes Absolute: 0.8 10*3/uL (ref 0.1–1.0)
Monocytes Relative: 11 %
Neutro Abs: 3.7 10*3/uL (ref 1.7–7.7)
Neutrophils Relative %: 55 %
Platelets: 214 10*3/uL (ref 150–400)
RBC: 4 MIL/uL — ABNORMAL LOW (ref 4.22–5.81)
RDW: 14.6 % (ref 11.5–15.5)
WBC: 6.7 10*3/uL (ref 4.0–10.5)
nRBC: 0 % (ref 0.0–0.2)

## 2020-12-24 LAB — CMP (CANCER CENTER ONLY)
ALT: 11 U/L (ref 0–44)
AST: 11 U/L — ABNORMAL LOW (ref 15–41)
Albumin: 3.7 g/dL (ref 3.5–5.0)
Alkaline Phosphatase: 73 U/L (ref 38–126)
Anion gap: 11 (ref 5–15)
BUN: 38 mg/dL — ABNORMAL HIGH (ref 8–23)
CO2: 18 mmol/L — ABNORMAL LOW (ref 22–32)
Calcium: 9.1 mg/dL (ref 8.9–10.3)
Chloride: 114 mmol/L — ABNORMAL HIGH (ref 98–111)
Creatinine: 1.75 mg/dL — ABNORMAL HIGH (ref 0.61–1.24)
GFR, Estimated: 43 mL/min — ABNORMAL LOW (ref >60)
Glucose, Bld: 94 mg/dL (ref 70–99)
Potassium: 4 mmol/L (ref 3.5–5.1)
Sodium: 143 mmol/L (ref 135–145)
Total Bilirubin: 0.6 mg/dL (ref 0.3–1.2)
Total Protein: 7 g/dL (ref 6.5–8.1)

## 2020-12-24 MED ORDER — SODIUM CHLORIDE 0.9 % IV SOLN
Freq: Once | INTRAVENOUS | Status: AC
  Filled 2020-12-24: qty 250

## 2020-12-24 MED ORDER — DEXTROSE 5 % IV SOLN
56.0000 mg/m2 | Freq: Once | INTRAVENOUS | Status: AC
  Administered 2020-12-24: 100 mg via INTRAVENOUS
  Filled 2020-12-24: qty 30

## 2020-12-24 MED ORDER — FAMOTIDINE 20 MG PO TABS
ORAL_TABLET | ORAL | Status: AC
  Filled 2020-12-24: qty 1

## 2020-12-24 MED ORDER — SODIUM CHLORIDE 0.9 % IV SOLN
Freq: Once | INTRAVENOUS | Status: DC
  Filled 2020-12-24: qty 250

## 2020-12-24 MED ORDER — SODIUM CHLORIDE 0.9 % IV SOLN
INTRAVENOUS | Status: AC
  Filled 2020-12-24: qty 250

## 2020-12-24 MED ORDER — SODIUM CHLORIDE 0.9 % IV SOLN
20.0000 mg | Freq: Once | INTRAVENOUS | Status: AC
  Administered 2020-12-24: 20 mg via INTRAVENOUS
  Filled 2020-12-24: qty 20

## 2020-12-24 MED ORDER — ACETAMINOPHEN 325 MG PO TABS
ORAL_TABLET | ORAL | Status: AC
  Filled 2020-12-24: qty 2

## 2020-12-24 MED ORDER — ACETAMINOPHEN 325 MG PO TABS
650.0000 mg | ORAL_TABLET | Freq: Once | ORAL | Status: AC
  Administered 2020-12-24: 650 mg via ORAL

## 2020-12-24 MED ORDER — FAMOTIDINE 20 MG PO TABS
20.0000 mg | ORAL_TABLET | Freq: Once | ORAL | Status: AC
Start: 2020-12-24 — End: 2020-12-24
  Administered 2020-12-24: 20 mg via ORAL

## 2020-12-24 NOTE — Progress Notes (Signed)
Verbal order per Dr. Irene Limbo: Madaline Brilliant to receive Kyprolis today with Creatinine 1.75

## 2020-12-24 NOTE — Patient Instructions (Signed)
[facility] Discharge Instructions for Patients Receiving Chemotherapy  Today you received the following chemotherapy agents Kyprolis  To help prevent nausea and vomiting after your treatment, we encourage you to take your nausea medication as directed.  If you develop nausea and vomiting that is not controlled by your nausea medication, call the clinic.   BELOW ARE SYMPTOMS THAT SHOULD BE REPORTED IMMEDIATELY:  *FEVER GREATER THAN 100.5 F  *CHILLS WITH OR WITHOUT FEVER  NAUSEA AND VOMITING THAT IS NOT CONTROLLED WITH YOUR NAUSEA MEDICATION  *UNUSUAL SHORTNESS OF BREATH  *UNUSUAL BRUISING OR BLEEDING  TENDERNESS IN MOUTH AND THROAT WITH OR WITHOUT PRESENCE OF ULCERS  *URINARY PROBLEMS  *BOWEL PROBLEMS  UNUSUAL RASH Items with * indicate a potential emergency and should be followed up as soon as possible.  Feel free to call the clinic should you have any questions or concerns. The clinic phone number is [phone].  Please show the CHEMO ALERT CARD at check-in to the Emergency Department and triage nurse.  Thank you for choosing [facility] to provide your oncology and hematology care.   Should you have questions after your visit to the [facility] ([facility]), please contact this office at [phone] between 8:30 AM and 4:30 PM.  Voice mails left after 4:00 PM may not be returned until the following business day.  Calls received after 4:30 PM will be answered by an off-site Nurse Triage Line.    Prescription Refills:  Please have your pharmacy contact us directly for most prescription requests.  Contact the office directly for refills of narcotics (pain medications). Allow 48-72 hours for refills.  Appointments: Please contact the [facility] scheduling department [phone] for questions regarding [facility] appointment scheduling.  Contact the schedulers with any scheduling changes so that your appointment can be rescheduled in a timely  manner.   Central Scheduling for [place] [phone] - Call to schedule procedures such as PET scans, CT scans, MRI, Ultrasound, etc.  To afford each patient quality time with our providers, please arrive 30 minutes before your scheduled appointment time.  If you arrive late for your appointment, you may be asked to reschedule.  We strive to give you quality time with our providers, and arriving late affects you and other patients whose appointments are after yours. If you are a no show for multiple scheduled visits, you may be dismissed from the clinic at the providers discretion.     Resources: [facility] Social Workers 336-832-0950 for additional information on assistance programs or assistance connecting with community support programs   [place] DSS  336-641-3447: Information regarding food stamps, Medicaid, and utility assistance [place] [employer] [place] [phone]   [place] [employer]'s shared-ride transportation service for eligible riders who have a disability that prevents them from riding the fixed route bus.   Medicare Rights Center 800-333-4114 Helps people with Medicare understand their rights and benefits, navigate the Medicare system, and secure the quality healthcare they deserve American Cancer Society 800-227-2345 Assists patients locate various types of support and financial assistance Cancer Care: 1-800-813-HOPE (4673) Provides financial assistance, online support groups, medication/co-pay assistance.   Transportation Assistance for appointments at [facility]: Transportation Coordinator [phone]  Again, thank you for choosing  Cancer Center for your care.      

## 2020-12-24 NOTE — Progress Notes (Signed)
Per Erskine Emery, RN, patient is ok to treat with Scr 1.75 per Dr. Irene Limbo.

## 2020-12-27 LAB — KAPPA/LAMBDA LIGHT CHAINS
Kappa free light chain: 24 mg/L — ABNORMAL HIGH (ref 3.3–19.4)
Kappa, lambda light chain ratio: 1.98 — ABNORMAL HIGH (ref 0.26–1.65)
Lambda free light chains: 12.1 mg/L (ref 5.7–26.3)

## 2021-01-06 NOTE — Progress Notes (Signed)
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 His labs today 12/14/2017 show hgb 8.3, platelets 524k. He is doing well overall. He has staples in his right upper leg following a recent surgery and has not been in touch with the surgeons for a f/u of this. He is not receiving physical therapy and is not getting around at home.  He notes that he was not scheduled for a post hospitalization nephrology f/u and has no PCP.  On review of systems, pt reports intermittent fatigue, SOB, pain to the left leg, dizziness, dry mouth and denies fever, chills, night sweats, dysuria and any other accompanying symptoms.   INTERVAL HISTORY:    Andres Fernandez presents today for follow up and continue management of his myeloma/plasma cell leukemia. The patient's last visit with [name] was on 11/26/2020. The pt reports that he is doing well overall.  The pt reports no new symptoms or concerns. He reports continued muscle cramping intermittently when taking the Revlimid. The pt has received his COVID vaccines and booster.  The pt notes that he currently works 5-6x weekly.  Lab results today 01/07/2021 of CBC w/diff and CMP is as follows: all values are WNL except for RBC of 4.13, HCT of 38.4, Chloride of 113, CO2 of 19, Glucose of 107, BUN of 32, Creatinine of 1.89, Calcium of 8.8, AST of 12, GFR est of 39.  On review of systems, pt reports chronic muscle cramps and denies fevers, chills, night sweats, fatigue, infection issues, leg swelling and any other symptoms.  REVIEW OF SYSTEMS:   10 Point review of Systems was done is negative except as noted above.  MEDICAL HISTORY:  Past Medical History:  Diagnosis Date  . BPH (benign prostatic hyperplasia)   . Hepatitis C  11/27/2017  . Hypertension   . Plasma cell leukemia ([place]) 11/23/2017  . Tobacco abuse     SURGICAL HISTORY: Past Surgical History:  Procedure Laterality Date  . APPENDECTOMY    . FEMUR IM NAIL Right 11/20/2017   Procedure: RIGHT FEMORAL INTRAMEDULLARY (IM) NAIL;  Surgeon: Nicholes Stairs, MD;  Location: [place];  Service: Orthopedics;  Laterality: Right;    SOCIAL HISTORY: Social History   Socioeconomic History  . Marital status: Single    Spouse name: Not on file  . Number of children: Not on file  . Years of education: Not on file  . Highest education level: Not on file  Occupational History  . Not on file  Tobacco Use  . Smoking status: Current Every Day Smoker    Packs/day: 0.25    Types: Cigarettes    Last attempt to quit: 11/09/2017    Years since quitting: 3.1  . Smokeless tobacco: Never Used  . Tobacco comment: 1 cigarette/day as of 09/25/18  Vaping Use  . Vaping Use: Never used  Substance and Sexual Activity  . Alcohol use: Yes  . Drug use: No  . Sexual activity: Not on file  Other Topics Concern  . Not on file  Social History Narrative  . Not on file   Social Determinants of Health   Financial Resource Strain: Not on file  Food Insecurity: Not on file  Transportation Needs: Not on file  Physical Activity: Not on file  Stress:  Not on file  Social Connections: Not on file  Intimate Partner Violence: Not on file    FAMILY HISTORY: Family History  Problem Relation Age of Onset  . COPD Mother   . Liver cancer Mother     ALLERGIES:  has No Known Allergies.  MEDICATIONS:  . Current Outpatient Medications on File Prior to Visit  Medication Sig Dispense Refill  . acyclovir (ZOVIRAX) 400 MG tablet Take 1 tablet (400 mg total) by mouth daily. (Patient taking differently: Take 400 mg by mouth at bedtime. ) 30 tablet 11  . Albuterol Sulfate (PROAIR RESPICLICK) 557 (90 Base) MCG/ACT AEPB Inhale into the lungs.    [name] aspirin EC 81 MG tablet Take 1  tablet (81 mg total) by mouth daily. (Patient taking differently: Take 81 mg by mouth at bedtime. ) 60 tablet 11  . cholecalciferol (VITAMIN D) 400 units TABS tablet Take 400 Units by mouth at bedtime.     [name] lenalidomide (REVLIMID) 10 MG capsule Take 1 capsule (10 mg total) by mouth daily. Take for 21 days on, 7 days off, repeat every 28 days. 21 capsule 0  . Multiple Vitamins-Minerals (MULTIVITAMIN WITH MINERALS) tablet Take 1 tablet by mouth at bedtime.     . nicotine (NICODERM CQ - DOSED IN MG/24 HOURS) 14 mg/24hr patch PLACE 1 PATCH (14 MG TOTAL) ONTO THE SKIN DAILY. (Patient taking differently: Place 14 mg onto the skin daily. ) 28 patch 1  . ondansetron (ZOFRAN) 4 MG tablet Take 2 tablets (8 mg total) by mouth every 8 (eight) hours as needed for nausea. 30 tablet 0  . tiZANidine (ZANAFLEX) 2 MG tablet Take 1 tablet (2 mg total) by mouth every 8 (eight) hours as needed for muscle spasms. 30 tablet 0  . Vitamin D, Ergocalciferol, (DRISDOL) 1.25 MG (50000 UNIT) CAPS capsule Take 1 capsule (50,000 Units total) by mouth once a week. (Patient taking differently: Take 50,000 Units by mouth every Thursday. ) 12 capsule 6   No current facility-administered medications on file prior to visit.    PHYSICAL EXAMINATION:  ECOG FS:1 - Symptomatic but completely ambulatory  Vitals:   01/07/21 0948  BP: 130/79  Pulse: 68  Resp: 18  Temp: 97.9 F (36.6 C)  SpO2: 100%   Wt Readings from Last 3 Encounters:  12/24/20 164 lb (74.4 kg)  12/09/20 160 lb 0.1 oz (72.6 kg)  11/26/20 154 lb 12.8 oz (70.2 kg)   Body mass index is 24.28 kg/m.     GENERAL:alert, in no acute distress and comfortable SKIN: no acute rashes, no significant lesions EYES: conjunctiva are pink and non-injected, sclera anicteric OROPHARYNX: MMM, no exudates, no oropharyngeal erythema or ulceration NECK: supple, no JVD LYMPH:  no palpable lymphadenopathy in the cervical, axillary or inguinal regions LUNGS: clear to auscultation  b/l with normal respiratory effort HEART: regular rate & rhythm ABDOMEN:  normoactive bowel sounds , non tender, not distended. Extremity: no pedal edema PSYCH: alert & oriented x 3 with fluent speech NEURO: no focal motor/sensory deficits  LABORATORY DATA:  I have reviewed the data as listed  . CBC Latest Ref Rng & Units 01/07/2021 12/24/2020 12/09/2020  WBC 4.0 - 10.5 K/uL 6.5 6.7 6.9  Hemoglobin 13.0 - 17.0 g/dL 13.0 12.6(L) 13.0  Hematocrit 39.0 - 52.0 % 38.4(L) 36.9(L) 38.2(L)  Platelets 150 - 400 K/uL 200 214 193   . CBC    Component Value Date/Time   WBC 6.5 01/07/2021 0922   RBC 4.13 (L) 01/07/2021 3220  HGB 13.0 01/07/2021 0922   HGB 11.9 (L) 08/27/2019 0922   HGB 8.3 (L) 12/14/2017 1230   HCT 38.4 (L) 01/07/2021 0922   HCT 23.9 (L) 12/14/2017 1230   PLT 200 01/07/2021 0922   PLT 203 08/27/2019 0922   PLT 524 (H) 12/14/2017 1230   MCV 93.0 01/07/2021 0922   MCV 88.4 12/14/2017 1230   MCH 31.5 01/07/2021 0922   MCHC 33.9 01/07/2021 0922   RDW 15.2 01/07/2021 0922   RDW 15.3 (H) 12/14/2017 1230   LYMPHSABS 1.8 01/07/2021 0922   LYMPHSABS 1.4 12/14/2017 1230   MONOABS 0.6 01/07/2021 0922   MONOABS 0.6 12/14/2017 1230   EOSABS 0.4 01/07/2021 0922   EOSABS 0.1 12/14/2017 1230   BASOSABS 0.1 01/07/2021 0922   BASOSABS 0.1 12/14/2017 1230     . CMP Latest Ref Rng & Units 01/07/2021 12/24/2020 12/09/2020  Glucose 70 - 99 mg/dL 107(H) 94 98  BUN 8 - 23 mg/dL 32(H) 38(H) 29(H)  Creatinine 0.61 - 1.24 mg/dL 1.89(H) 1.75(H) 1.76(H)  Sodium 135 - 145 mmol/L 140 143 142  Potassium 3.5 - 5.1 mmol/L 4.1 4.0 4.1  Chloride 98 - 111 mmol/L 113(H) 114(H) 111  CO2 22 - 32 mmol/L 19(L) 18(L) 24  Calcium 8.9 - 10.3 mg/dL 8.8(L) 9.1 9.1  Total Protein 6.5 - 8.1 g/dL 7.0 7.0 7.0  Total Bilirubin 0.3 - 1.2 mg/dL 0.4 0.6 0.5  Alkaline Phos 38 - 126 U/L 73 73 69  AST 15 - 41 U/L 12(L) 11(L) 11(L)  ALT 0 - 44 U/L 14 11 15    03/09/20 ([id number]) Bone Marrow Biopsy at [facility]   07/02/2019 Bone Marrow Biopsy ([id number]) at [facility]    08/07/18 BM Bx:       RADIOGRAPHIC STUDIES: I have personally reviewed the radiological images as listed and agreed with the findings in the report. No results found.  ASSESSMENT & PLAN:   65 y.o. 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   Initial BM Bx showed >95% involvement with kappa restricted serum free light chains. 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 MoL Cy-   08/07/18 BM Bx revealed normocellular bone marrow with trilineage hematopoiesis and 1% plasma cells which indicates the pt is in remission 08/06/18 PET/CT revealed  Expansile lytic lesion involving the left iliac bone, unchanged. Lytic lesion involving the sternum, unchanged. While both are suspicious for myeloma, neither lesion is FDG avid. No hypermetabolic osseous lesions in the visualized axial and appendicular skeleton. Please note that the bilateral lower extremities were not imaged. No suspicious lymphadenopathy. Spleen is normal in size.    Pt began Ninlaro 37m maintenance after pt finished 13 cycles of CyBorD and BM bx showed only 1% plasma cells, then resumed CyBorD and held Ninlaro in anticipation of his tandem bone marrow [facility] late December 2019 [facility]  11/19/18 High dose chemotherapy with Melphalan 1447mm2 and autologous stem cell rescue  03/20/19 High dose chemotherapy with Melphalan 20030m2 and autologous stem cell rescue. Non-infectious diarrhea, uncomplicated course otherwise.  07/02/2019 PET scan with results revealing "1. No FDG avid bone lesions are identified. 2.   Asymmetric uptake within the right, greater than left, palatine tonsils  with mild fullness of the right tonsillar soft tissue. ENT consult vs attention on follow up suggested. 3.  Innumerable mixed sclerotic and lytic lesions throughout the axial and appendicular skeleton without associated hypermetabolic activity. 4.  Ancillary CT findings as above."  07/02/2019 bone marrow biopsy ([id number])with results showing: "Trilineage hematopoieses with maturation. No plasma cell myeloma identified. Mild anemia and No Rouleaux or circulating plasma cells identified in the peripheral blood."  2) s/p Prophylactic IM nailing for rt femur  completed [facility] at the [facility].  -patient previously reported improvement and is currently progressively back to full time work.  3) Thrombocytopenia resolved   4) Renal insufficiency -- likely primarily related to Myeloma. stable Plan -maintain follow up with nephrology referral to Dr Marval Regal [facility] for continued treatment -Pt completed 8 weeks of maverick for hep c with Dr Linus Salmons on 07/08/18.  -Kidney numbers improved. Creatinine in the 1.7-2.2 range   5) h/o tobacco abuse-counseled on smoking cessation -Currently not smoking.  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 -Hgb improved/stable   -monitor  PLAN: -Discussed pt labwork today, 01/07/2021; blood counts stable and chemistries stable. Creatinine at baseline. -Recommended pt stay hydrated and increase electrolyte intake to aid with muscle cramping. -Recommend pt get colonoscopy following pandemic ease. -The pt has no prohibitive toxicities from continuing Carfilzomib every two weeks at this time. -The pt has no prohibitive toxicities from continuing 10 mg Revlimid 3 weeks on, 1 week off at this time. -Continue daily ASA and Vitamin D -Continue Pamidronate q8weeks -Will see back in 2 months  with labs.   FOLLOW UP: -Plz schedule next 6 doses of Carfilzomib with labs every 2 weeks as ordered -MD visit in 8 weeks    The total time spent in the appointment was 20 minutes and more than 50% was on counseling and direct patient cares.  All of the patient's questions were answered with apparent satisfaction. The patient knows to call the clinic with any problems, questions or concerns.   Sullivan Lone MD [place] AAHIVMS [facility] Hematology/Oncology Physician [facility]  (Office):       [phone] (Work cell):  [phone] (Fax):           [phone]  I, Reinaldo Raddle, am acting as scribe for Dr. Sullivan Lone, MD.   .I have reviewed the above documentation for accuracy and completeness, and I agree with the above. Brunetta Genera MD

## 2021-01-07 ENCOUNTER — Other Ambulatory Visit: Payer: BC Managed Care – PPO

## 2021-01-07 ENCOUNTER — Telehealth: Payer: Self-pay | Admitting: Hematology

## 2021-01-07 ENCOUNTER — Inpatient Hospital Stay (HOSPITAL_BASED_OUTPATIENT_CLINIC_OR_DEPARTMENT_OTHER): Payer: BC Managed Care – PPO | Admitting: Hematology

## 2021-01-07 ENCOUNTER — Ambulatory Visit: Payer: BC Managed Care – PPO

## 2021-01-07 ENCOUNTER — Other Ambulatory Visit: Payer: Self-pay

## 2021-01-07 ENCOUNTER — Inpatient Hospital Stay: Payer: BC Managed Care – PPO

## 2021-01-07 VITALS — BP 130/79 | HR 68 | Temp 97.9°F | Resp 18 | Ht 68.0 in | Wt 159.7 lb

## 2021-01-07 DIAGNOSIS — C9011 Plasma cell leukemia in remission: Secondary | ICD-10-CM

## 2021-01-07 DIAGNOSIS — Z5111 Encounter for antineoplastic chemotherapy: Secondary | ICD-10-CM

## 2021-01-07 DIAGNOSIS — Z7189 Other specified counseling: Secondary | ICD-10-CM

## 2021-01-07 DIAGNOSIS — C9010 Plasma cell leukemia not having achieved remission: Secondary | ICD-10-CM

## 2021-01-07 DIAGNOSIS — M84551A Pathological fracture in neoplastic disease, right femur, initial encounter for fracture: Secondary | ICD-10-CM

## 2021-01-07 LAB — CMP (CANCER CENTER ONLY)
ALT: 14 U/L (ref 0–44)
AST: 12 U/L — ABNORMAL LOW (ref 15–41)
Albumin: 3.9 g/dL (ref 3.5–5.0)
Alkaline Phosphatase: 73 U/L (ref 38–126)
Anion gap: 8 (ref 5–15)
BUN: 32 mg/dL — ABNORMAL HIGH (ref 8–23)
CO2: 19 mmol/L — ABNORMAL LOW (ref 22–32)
Calcium: 8.8 mg/dL — ABNORMAL LOW (ref 8.9–10.3)
Chloride: 113 mmol/L — ABNORMAL HIGH (ref 98–111)
Creatinine: 1.89 mg/dL — ABNORMAL HIGH (ref 0.61–1.24)
GFR, Estimated: 39 mL/min — ABNORMAL LOW (ref >60)
Glucose, Bld: 107 mg/dL — ABNORMAL HIGH (ref 70–99)
Potassium: 4.1 mmol/L (ref 3.5–5.1)
Sodium: 140 mmol/L (ref 135–145)
Total Bilirubin: 0.4 mg/dL (ref 0.3–1.2)
Total Protein: 7 g/dL (ref 6.5–8.1)

## 2021-01-07 LAB — CBC WITH DIFFERENTIAL/PLATELET
Abs Immature Granulocytes: 0.03 10*3/uL (ref 0.00–0.07)
Basophils Absolute: 0.1 10*3/uL (ref 0.0–0.1)
Basophils Relative: 2 %
Eosinophils Absolute: 0.4 10*3/uL (ref 0.0–0.5)
Eosinophils Relative: 7 %
HCT: 38.4 % — ABNORMAL LOW (ref 39.0–52.0)
Hemoglobin: 13 g/dL (ref 13.0–17.0)
Immature Granulocytes: 1 %
Lymphocytes Relative: 27 %
Lymphs Abs: 1.8 10*3/uL (ref 0.7–4.0)
MCH: 31.5 pg (ref 26.0–34.0)
MCHC: 33.9 g/dL (ref 30.0–36.0)
MCV: 93 fL (ref 80.0–100.0)
Monocytes Absolute: 0.6 10*3/uL (ref 0.1–1.0)
Monocytes Relative: 10 %
Neutro Abs: 3.6 10*3/uL (ref 1.7–7.7)
Neutrophils Relative %: 53 %
Platelets: 200 10*3/uL (ref 150–400)
RBC: 4.13 MIL/uL — ABNORMAL LOW (ref 4.22–5.81)
RDW: 15.2 % (ref 11.5–15.5)
WBC: 6.5 10*3/uL (ref 4.0–10.5)
nRBC: 0 % (ref 0.0–0.2)

## 2021-01-07 MED ORDER — SODIUM CHLORIDE 0.9 % IV SOLN
Freq: Once | INTRAVENOUS | Status: DC
  Filled 2021-01-07: qty 250

## 2021-01-07 MED ORDER — FAMOTIDINE 20 MG PO TABS
ORAL_TABLET | ORAL | Status: AC
  Filled 2021-01-07: qty 1

## 2021-01-07 MED ORDER — FAMOTIDINE 20 MG PO TABS
20.0000 mg | ORAL_TABLET | Freq: Once | ORAL | Status: AC
  Administered 2021-01-07: 20 mg via ORAL

## 2021-01-07 MED ORDER — SODIUM CHLORIDE 0.9 % IV SOLN
INTRAVENOUS | Status: AC
  Filled 2021-01-07: qty 250

## 2021-01-07 MED ORDER — SODIUM CHLORIDE 0.9 % IV SOLN: 60.0000 mg | Freq: Once | INTRAVENOUS | Status: DC

## 2021-01-07 MED ORDER — ACETAMINOPHEN 325 MG PO TABS
ORAL_TABLET | ORAL | Status: AC
  Filled 2021-01-07: qty 2

## 2021-01-07 MED ORDER — SODIUM CHLORIDE 0.9 % IV SOLN
20.0000 mg | Freq: Once | INTRAVENOUS | Status: AC
  Administered 2021-01-07: 20 mg via INTRAVENOUS
  Filled 2021-01-07: qty 20

## 2021-01-07 MED ORDER — ACETAMINOPHEN 325 MG PO TABS
650.0000 mg | ORAL_TABLET | Freq: Once | ORAL | Status: AC
  Administered 2021-01-07: 650 mg via ORAL

## 2021-01-07 MED ORDER — DEXTROSE 5 % IV SOLN
56.0000 mg/m2 | Freq: Once | INTRAVENOUS | Status: AC
  Administered 2021-01-07: 100 mg via INTRAVENOUS
  Filled 2021-01-07: qty 30

## 2021-01-07 MED ORDER — SODIUM CHLORIDE 0.9 % IV SOLN
Freq: Once | INTRAVENOUS | Status: AC
  Filled 2021-01-07: qty 250

## 2021-01-07 NOTE — Telephone Encounter (Signed)
Called pt per 1/28 sch msg - to lengthen infusion, time changed . Unable to reach pt. Left message for patient with apt date and time

## 2021-01-07 NOTE — Progress Notes (Signed)
Okay to treat with elevated creat per Dr. Irene Limbo. Also per Dr. Irene Limbo patient can receive the Aredia at his next scheduled appt instead of today. Staff RN aware.

## 2021-01-07 NOTE — Progress Notes (Signed)
Per Dr. Irene Limbo, ok to give total of 531mL NS for fluids today.

## 2021-01-07 NOTE — Patient Instructions (Signed)
[facility] Discharge Instructions for Patients Receiving Chemotherapy  Today you received the following chemotherapy agents Kyprolis  To help prevent nausea and vomiting after your treatment, we encourage you to take your nausea medication as directed.  If you develop nausea and vomiting that is not controlled by your nausea medication, call the clinic.   BELOW ARE SYMPTOMS THAT SHOULD BE REPORTED IMMEDIATELY:  *FEVER GREATER THAN 100.5 F  *CHILLS WITH OR WITHOUT FEVER  NAUSEA AND VOMITING THAT IS NOT CONTROLLED WITH YOUR NAUSEA MEDICATION  *UNUSUAL SHORTNESS OF BREATH  *UNUSUAL BRUISING OR BLEEDING  TENDERNESS IN MOUTH AND THROAT WITH OR WITHOUT PRESENCE OF ULCERS  *URINARY PROBLEMS  *BOWEL PROBLEMS  UNUSUAL RASH Items with * indicate a potential emergency and should be followed up as soon as possible.  Feel free to call the clinic should you have any questions or concerns. The clinic phone number is [phone].  Please show the CHEMO ALERT CARD at check-in to the Emergency Department and triage nurse.  Thank you for choosing [facility] to provide your oncology and hematology care.   Should you have questions after your visit to the [facility] ([facility]), please contact this office at [phone] between 8:30 AM and 4:30 PM.  Voice mails left after 4:00 PM may not be returned until the following business day.  Calls received after 4:30 PM will be answered by an off-site Nurse Triage Line.    Prescription Refills:  Please have your pharmacy contact us directly for most prescription requests.  Contact the office directly for refills of narcotics (pain medications). Allow 48-72 hours for refills.  Appointments: Please contact the [facility] scheduling department [phone] for questions regarding [facility] appointment scheduling.  Contact the schedulers with any scheduling changes so that your appointment can be rescheduled in a timely  manner.   Central Scheduling for [place] [phone] - Call to schedule procedures such as PET scans, CT scans, MRI, Ultrasound, etc.  To afford each patient quality time with our providers, please arrive 30 minutes before your scheduled appointment time.  If you arrive late for your appointment, you may be asked to reschedule.  We strive to give you quality time with our providers, and arriving late affects you and other patients whose appointments are after yours. If you are a no show for multiple scheduled visits, you may be dismissed from the clinic at the providers discretion.     Resources: [facility] Social Workers [phone] for additional information on assistance programs or assistance connecting with community support programs   [place] DSS  336-641-3447: Information regarding food stamps, Medicaid, and utility assistance [place] [employer] [place] 336-333-6589   [place] [employer]'s shared-ride transportation service for eligible riders who have a disability that prevents them from riding the fixed route bus.   Medicare Rights Center 800-333-4114 Helps people with Medicare understand their rights and benefits, navigate the Medicare system, and secure the quality healthcare they deserve American Cancer Society 800-227-2345 Assists patients locate various types of support and financial assistance Cancer Care: 1-800-813-HOPE (4673) Provides financial assistance, online support groups, medication/co-pay assistance.   Transportation Assistance for appointments at [facility]: Transportation Coordinator [phone]  Again, thank you for choosing [facility] for your care.      

## 2021-01-12 ENCOUNTER — Other Ambulatory Visit: Payer: Self-pay | Admitting: *Deleted

## 2021-01-12 DIAGNOSIS — C9010 Plasma cell leukemia not having achieved remission: Secondary | ICD-10-CM

## 2021-01-12 MED ORDER — LENALIDOMIDE 10 MG PO CAPS: 10.0000 mg | ORAL_CAPSULE | Freq: Every day | ORAL | 0 refills | Status: DC

## 2021-01-12 NOTE — Telephone Encounter (Signed)
Received faxed refill request for Revlimid from [facility]. Refilled per Dr. Irene Limbo OV note 01/07/21

## 2021-01-21 ENCOUNTER — Other Ambulatory Visit: Payer: BC Managed Care – PPO

## 2021-01-21 ENCOUNTER — Inpatient Hospital Stay: Payer: BC Managed Care – PPO

## 2021-01-21 ENCOUNTER — Other Ambulatory Visit: Payer: Self-pay

## 2021-01-21 ENCOUNTER — Inpatient Hospital Stay: Payer: BC Managed Care – PPO | Attending: Hematology

## 2021-01-21 ENCOUNTER — Ambulatory Visit: Payer: BC Managed Care – PPO

## 2021-01-21 ENCOUNTER — Other Ambulatory Visit: Payer: Self-pay | Admitting: Hematology

## 2021-01-21 VITALS — BP 142/88 | HR 79 | Temp 98.4°F | Resp 18

## 2021-01-21 DIAGNOSIS — M84551A Pathological fracture in neoplastic disease, right femur, initial encounter for fracture: Secondary | ICD-10-CM

## 2021-01-21 DIAGNOSIS — Z5112 Encounter for antineoplastic immunotherapy: Secondary | ICD-10-CM | POA: Diagnosis present

## 2021-01-21 DIAGNOSIS — C9010 Plasma cell leukemia not having achieved remission: Secondary | ICD-10-CM

## 2021-01-21 DIAGNOSIS — Z9484 Stem cells transplant status: Secondary | ICD-10-CM

## 2021-01-21 DIAGNOSIS — C9031 Solitary plasmacytoma in remission: Secondary | ICD-10-CM | POA: Diagnosis present

## 2021-01-21 DIAGNOSIS — Z7189 Other specified counseling: Secondary | ICD-10-CM

## 2021-01-21 DIAGNOSIS — Z5111 Encounter for antineoplastic chemotherapy: Secondary | ICD-10-CM

## 2021-01-21 LAB — CBC WITH DIFFERENTIAL/PLATELET
Abs Immature Granulocytes: 0.02 10*3/uL (ref 0.00–0.07)
Basophils Absolute: 0.1 10*3/uL (ref 0.0–0.1)
Basophils Relative: 2 %
Eosinophils Absolute: 0.3 10*3/uL (ref 0.0–0.5)
Eosinophils Relative: 5 %
HCT: 41 % (ref 39.0–52.0)
Hemoglobin: 13.6 g/dL (ref 13.0–17.0)
Immature Granulocytes: 0 %
Lymphocytes Relative: 27 %
Lymphs Abs: 1.5 10*3/uL (ref 0.7–4.0)
MCH: 31.5 pg (ref 26.0–34.0)
MCHC: 33.2 g/dL (ref 30.0–36.0)
MCV: 94.9 fL (ref 80.0–100.0)
Monocytes Absolute: 0.6 10*3/uL (ref 0.1–1.0)
Monocytes Relative: 11 %
Neutro Abs: 3 10*3/uL (ref 1.7–7.7)
Neutrophils Relative %: 55 %
Platelets: 222 10*3/uL (ref 150–400)
RBC: 4.32 MIL/uL (ref 4.22–5.81)
RDW: 15.6 % — ABNORMAL HIGH (ref 11.5–15.5)
WBC: 5.6 10*3/uL (ref 4.0–10.5)
nRBC: 0 % (ref 0.0–0.2)

## 2021-01-21 LAB — CMP (CANCER CENTER ONLY)
ALT: 13 U/L (ref 0–44)
AST: 11 U/L — ABNORMAL LOW (ref 15–41)
Albumin: 4.1 g/dL (ref 3.5–5.0)
Alkaline Phosphatase: 74 U/L (ref 38–126)
Anion gap: 10 (ref 5–15)
BUN: 32 mg/dL — ABNORMAL HIGH (ref 8–23)
CO2: 19 mmol/L — ABNORMAL LOW (ref 22–32)
Calcium: 9.3 mg/dL (ref 8.9–10.3)
Chloride: 110 mmol/L (ref 98–111)
Creatinine: 1.85 mg/dL — ABNORMAL HIGH (ref 0.61–1.24)
GFR, Estimated: 40 mL/min — ABNORMAL LOW (ref >60)
Glucose, Bld: 87 mg/dL (ref 70–99)
Potassium: 3.8 mmol/L (ref 3.5–5.1)
Sodium: 139 mmol/L (ref 135–145)
Total Bilirubin: 0.5 mg/dL (ref 0.3–1.2)
Total Protein: 7.4 g/dL (ref 6.5–8.1)

## 2021-01-21 MED ORDER — ACETAMINOPHEN 325 MG PO TABS
ORAL_TABLET | ORAL | Status: AC
  Filled 2021-01-21: qty 2

## 2021-01-21 MED ORDER — SODIUM CHLORIDE 0.9 % IV SOLN
60.0000 mg | Freq: Once | INTRAVENOUS | Status: AC
  Administered 2021-01-21: 60 mg via INTRAVENOUS
  Filled 2021-01-21: qty 10

## 2021-01-21 MED ORDER — SODIUM CHLORIDE 0.9 % IV SOLN
Freq: Once | INTRAVENOUS | Status: AC
  Filled 2021-01-21: qty 250

## 2021-01-21 MED ORDER — DEXTROSE 5 % IV SOLN
56.0000 mg/m2 | Freq: Once | INTRAVENOUS | Status: AC
  Administered 2021-01-21: 100 mg via INTRAVENOUS
  Filled 2021-01-21: qty 30

## 2021-01-21 MED ORDER — ACETAMINOPHEN 325 MG PO TABS
650.0000 mg | ORAL_TABLET | Freq: Once | ORAL | Status: AC
Start: 2021-01-21 — End: 2021-01-21
  Administered 2021-01-21: 650 mg via ORAL

## 2021-01-21 MED ORDER — FAMOTIDINE 20 MG PO TABS
ORAL_TABLET | ORAL | Status: AC
  Filled 2021-01-21: qty 1

## 2021-01-21 MED ORDER — SODIUM CHLORIDE 0.9 % IV SOLN
INTRAVENOUS | Status: DC
  Filled 2021-01-21: qty 250

## 2021-01-21 MED ORDER — SODIUM CHLORIDE 0.9 % IV SOLN
Freq: Once | INTRAVENOUS | Status: DC
  Filled 2021-01-21: qty 250

## 2021-01-21 MED ORDER — SODIUM CHLORIDE 0.9 % IV SOLN
60.0000 mg | Freq: Once | INTRAVENOUS | Status: DC
  Filled 2021-01-21: qty 20

## 2021-01-21 MED ORDER — FAMOTIDINE 20 MG PO TABS
20.0000 mg | ORAL_TABLET | Freq: Once | ORAL | Status: AC
  Administered 2021-01-21: 20 mg via ORAL

## 2021-01-21 MED ORDER — SODIUM CHLORIDE 0.9 % IV SOLN
INTRAVENOUS | Status: AC
  Filled 2021-01-21: qty 250

## 2021-01-21 MED ORDER — SODIUM CHLORIDE 0.9 % IV SOLN
20.0000 mg | Freq: Once | INTRAVENOUS | Status: AC
  Administered 2021-01-21: 20 mg via INTRAVENOUS
  Filled 2021-01-21: qty 20

## 2021-01-21 NOTE — Progress Notes (Signed)
Per Dr. Kale OK to treat with today's labs  

## 2021-01-21 NOTE — Patient Instructions (Signed)
[facility] Discharge Instructions for Patients Receiving Chemotherapy  Today you received the following chemotherapy agents Kyprolis  To help prevent nausea and vomiting after your treatment, we encourage you to take your nausea medication as directed.  If you develop nausea and vomiting that is not controlled by your nausea medication, call the clinic.   BELOW ARE SYMPTOMS THAT SHOULD BE REPORTED IMMEDIATELY:  *FEVER GREATER THAN 100.5 F  *CHILLS WITH OR WITHOUT FEVER  NAUSEA AND VOMITING THAT IS NOT CONTROLLED WITH YOUR NAUSEA MEDICATION  *UNUSUAL SHORTNESS OF BREATH  *UNUSUAL BRUISING OR BLEEDING  TENDERNESS IN MOUTH AND THROAT WITH OR WITHOUT PRESENCE OF ULCERS  *URINARY PROBLEMS  *BOWEL PROBLEMS  UNUSUAL RASH Items with * indicate a potential emergency and should be followed up as soon as possible.  Feel free to call the clinic should you have any questions or concerns. The clinic phone number is [phone].  Please show the [place] at check-in to the Emergency Department and triage nurse.  Thank you for choosing [place] to provide your oncology and hematology care.   Should you have questions after your visit to the [facility]), please contact this office at [phone] between 8:30 AM and 4:30 PM.  Voice mails left after 4:00 PM may not be returned until the following business day.  Calls received after 4:30 PM will be answered by an off-site Nurse Triage Line.    Prescription Refills:  Please have your pharmacy contact us directly for most prescription requests.  Contact the office directly for refills of narcotics (pain medications). Allow 48-72 hours for refills.  Appointments: Please contact the [facility] scheduling department [phone] for questions regarding [facility] appointment scheduling.  Contact the schedulers with any scheduling changes so that your appointment can be rescheduled in a timely  manner.   Central Scheduling for [facility] [phone] - Call to schedule procedures such as PET scans, CT scans, MRI, Ultrasound, etc.  To afford each patient quality time with our providers, please arrive 30 minutes before your scheduled appointment time.  If you arrive late for your appointment, you may be asked to reschedule.  We strive to give you quality time with our providers, and arriving late affects you and other patients whose appointments are after yours. If you are a no show for multiple scheduled visits, you may be dismissed from the clinic at the providers discretion.     Resources: [place] Workers [phone] for additional information on assistance programs or assistance connecting with community support programs   [place]  [phone]: Information regarding food stamps, Medicaid, and utility assistance [employer] [place] [employer]'s shared-ride transportation service for eligible riders who have a disability that prevents them from riding the fixed route bus.   [place] 480-401-4968 Helps people with Medicare understand their rights and benefits, navigate the Medicare system, and secure the quality healthcare they deserve American Cancer Society 272-157-8606 Assists patients locate various types of support and financial assistance Cancer Care: 1-800-813-HOPE 7252971815) Provides financial assistance, online support groups, medication/co-pay assistance.   Transportation Assistance for appointments at [facility]: [facility] [phone]  Again, thank you for choosing [facility] for your care.

## 2021-01-24 LAB — MULTIPLE MYELOMA PANEL, SERUM
Albumin SerPl Elph-Mcnc: 3.9 g/dL (ref 2.9–4.4)
Albumin/Glob SerPl: 1.3 (ref 0.7–1.7)
Alpha 1: 0.2 g/dL (ref 0.0–0.4)
Alpha2 Glob SerPl Elph-Mcnc: 1.2 g/dL — ABNORMAL HIGH (ref 0.4–1.0)
B-Globulin SerPl Elph-Mcnc: 0.9 g/dL (ref 0.7–1.3)
Gamma Glob SerPl Elph-Mcnc: 0.7 g/dL (ref 0.4–1.8)
Globulin, Total: 3.1 g/dL (ref 2.2–3.9)
IgA: 68 mg/dL (ref 61–437)
IgG (Immunoglobin G), Serum: 739 mg/dL (ref 603–1613)
IgM (Immunoglobulin M), Srm: <5 mg/dL — ABNORMAL LOW (ref 20–172)
Total Protein ELP: 7 g/dL (ref 6.0–8.5)

## 2021-01-24 LAB — KAPPA/LAMBDA LIGHT CHAINS
Kappa free light chain: 22.7 mg/L — ABNORMAL HIGH (ref 3.3–19.4)
Kappa, lambda light chain ratio: 1.23 (ref 0.26–1.65)
Lambda free light chains: 18.4 mg/L (ref 5.7–26.3)

## 2021-02-03 ENCOUNTER — Inpatient Hospital Stay: Payer: BC Managed Care – PPO

## 2021-02-03 ENCOUNTER — Other Ambulatory Visit: Payer: BC Managed Care – PPO

## 2021-02-03 ENCOUNTER — Other Ambulatory Visit: Payer: Self-pay

## 2021-02-03 VITALS — BP 124/75 | HR 70 | Temp 98.4°F | Resp 18

## 2021-02-03 DIAGNOSIS — C9031 Solitary plasmacytoma in remission: Secondary | ICD-10-CM | POA: Diagnosis not present

## 2021-02-03 DIAGNOSIS — Z7189 Other specified counseling: Secondary | ICD-10-CM

## 2021-02-03 DIAGNOSIS — C9011 Plasma cell leukemia in remission: Secondary | ICD-10-CM

## 2021-02-03 DIAGNOSIS — Z5111 Encounter for antineoplastic chemotherapy: Secondary | ICD-10-CM

## 2021-02-03 DIAGNOSIS — C9010 Plasma cell leukemia not having achieved remission: Secondary | ICD-10-CM

## 2021-02-03 LAB — CMP (CANCER CENTER ONLY)
ALT: 12 U/L (ref 0–44)
AST: 11 U/L — ABNORMAL LOW (ref 15–41)
Albumin: 3.7 g/dL (ref 3.5–5.0)
Alkaline Phosphatase: 77 U/L (ref 38–126)
Anion gap: 10 (ref 5–15)
BUN: 36 mg/dL — ABNORMAL HIGH (ref 8–23)
CO2: 19 mmol/L — ABNORMAL LOW (ref 22–32)
Calcium: 8.5 mg/dL — ABNORMAL LOW (ref 8.9–10.3)
Chloride: 109 mmol/L (ref 98–111)
Creatinine: 1.88 mg/dL — ABNORMAL HIGH (ref 0.61–1.24)
GFR, Estimated: 40 mL/min — ABNORMAL LOW (ref >60)
Glucose, Bld: 112 mg/dL — ABNORMAL HIGH (ref 70–99)
Potassium: 4.1 mmol/L (ref 3.5–5.1)
Sodium: 138 mmol/L (ref 135–145)
Total Bilirubin: 0.4 mg/dL (ref 0.3–1.2)
Total Protein: 7 g/dL (ref 6.5–8.1)

## 2021-02-03 LAB — CBC WITH DIFFERENTIAL/PLATELET
Abs Immature Granulocytes: 0.03 10*3/uL (ref 0.00–0.07)
Basophils Absolute: 0.1 10*3/uL (ref 0.0–0.1)
Basophils Relative: 1 %
Eosinophils Absolute: 0.4 10*3/uL (ref 0.0–0.5)
Eosinophils Relative: 5 %
HCT: 35.6 % — ABNORMAL LOW (ref 39.0–52.0)
Hemoglobin: 12.2 g/dL — ABNORMAL LOW (ref 13.0–17.0)
Immature Granulocytes: 0 %
Lymphocytes Relative: 27 %
Lymphs Abs: 2 10*3/uL (ref 0.7–4.0)
MCH: 31.9 pg (ref 26.0–34.0)
MCHC: 34.3 g/dL (ref 30.0–36.0)
MCV: 93 fL (ref 80.0–100.0)
Monocytes Absolute: 0.8 10*3/uL (ref 0.1–1.0)
Monocytes Relative: 10 %
Neutro Abs: 4.2 10*3/uL (ref 1.7–7.7)
Neutrophils Relative %: 57 %
Platelets: 196 10*3/uL (ref 150–400)
RBC: 3.83 MIL/uL — ABNORMAL LOW (ref 4.22–5.81)
RDW: 15.2 % (ref 11.5–15.5)
WBC: 7.6 10*3/uL (ref 4.0–10.5)
nRBC: 0 % (ref 0.0–0.2)

## 2021-02-03 MED ORDER — ACETAMINOPHEN 325 MG PO TABS
ORAL_TABLET | ORAL | Status: AC
  Filled 2021-02-03: qty 2

## 2021-02-03 MED ORDER — ACETAMINOPHEN 325 MG PO TABS
650.0000 mg | ORAL_TABLET | Freq: Once | ORAL | Status: AC
  Administered 2021-02-03: 650 mg via ORAL

## 2021-02-03 MED ORDER — DEXTROSE 5 % IV SOLN
56.0000 mg/m2 | Freq: Once | INTRAVENOUS | Status: AC
  Administered 2021-02-03: 100 mg via INTRAVENOUS
  Filled 2021-02-03: qty 30

## 2021-02-03 MED ORDER — SODIUM CHLORIDE 0.9 % IV SOLN
20.0000 mg | Freq: Once | INTRAVENOUS | Status: AC
  Administered 2021-02-03: 20 mg via INTRAVENOUS
  Filled 2021-02-03: qty 20

## 2021-02-03 MED ORDER — FAMOTIDINE 20 MG PO TABS
ORAL_TABLET | ORAL | Status: AC
  Filled 2021-02-03: qty 1

## 2021-02-03 MED ORDER — SODIUM CHLORIDE 0.9 % IV SOLN
INTRAVENOUS | Status: AC
  Filled 2021-02-03 (×2): qty 250

## 2021-02-03 MED ORDER — FAMOTIDINE 20 MG PO TABS
20.0000 mg | ORAL_TABLET | Freq: Once | ORAL | Status: AC
  Administered 2021-02-03: 20 mg via ORAL

## 2021-02-03 MED ORDER — SODIUM CHLORIDE 0.9 % IV SOLN
Freq: Once | INTRAVENOUS | Status: DC
  Filled 2021-02-03: qty 250

## 2021-02-03 NOTE — Patient Instructions (Signed)
[facility] Discharge Instructions for Patients Receiving Chemotherapy  Today you received the following chemotherapy agents Kyprolis  To help prevent nausea and vomiting after your treatment, we encourage you to take your nausea medication as directed.  If you develop nausea and vomiting that is not controlled by your nausea medication, call the clinic.   BELOW ARE SYMPTOMS THAT SHOULD BE REPORTED IMMEDIATELY:  *FEVER GREATER THAN 100.5 F  *CHILLS WITH OR WITHOUT FEVER  NAUSEA AND VOMITING THAT IS NOT CONTROLLED WITH YOUR NAUSEA MEDICATION  *UNUSUAL SHORTNESS OF BREATH  *UNUSUAL BRUISING OR BLEEDING  TENDERNESS IN MOUTH AND THROAT WITH OR WITHOUT PRESENCE OF ULCERS  *URINARY PROBLEMS  *BOWEL PROBLEMS  UNUSUAL RASH Items with * indicate a potential emergency and should be followed up as soon as possible.  Feel free to call the clinic should you have any questions or concerns. The clinic phone number is [phone].  Please show the [place] at check-in to the Emergency Department and triage nurse.  Thank you for choosing [facility] to provide your oncology and hematology care.   Should you have questions after your visit to the [facility]), please contact this office at [phone] between 8:30 AM and 4:30 PM.  Voice mails left after 4:00 PM may not be returned until the following business day.  Calls received after 4:30 PM will be answered by an off-site Nurse Triage Line.    Prescription Refills:  Please have your pharmacy contact us directly for most prescription requests.  Contact the office directly for refills of narcotics (pain medications). Allow 48-72 hours for refills.  Appointments: Please contact the [facility] scheduling department [phone] for questions regarding [facility] appointment scheduling.  Contact the schedulers with any scheduling changes so that your appointment can be rescheduled in a timely  manner.   Central Scheduling for [place] [phone] - Call to schedule procedures such as PET scans, CT scans, MRI, Ultrasound, etc.  To afford each patient quality time with our providers, please arrive 30 minutes before your scheduled appointment time.  If you arrive late for your appointment, you may be asked to reschedule.  We strive to give you quality time with our providers, and arriving late affects you and other patients whose appointments are after yours. If you are a no show for multiple scheduled visits, you may be dismissed from the clinic at the providers discretion.     Resources: [place] Workers [phone] for additional information on assistance programs or assistance connecting with community support programs   [place]  [phone]: Information regarding food stamps, Medicaid, and utility assistance [employer] [place] [employer]'s shared-ride transportation service for eligible riders who have a disability that prevents them from riding the fixed route bus.   [place] 515-016-7928 Helps people with Medicare understand their rights and benefits, navigate the Medicare system, and secure the quality healthcare they deserve American Cancer Society 313-315-5287 Assists patients locate various types of support and financial assistance Cancer Care: 1-800-813-HOPE 7022396756) Provides financial assistance, online support groups, medication/co-pay assistance.   Transportation Assistance for appointments at [facility]: [facility] [phone]  Again, thank you for choosing [facility] for your care.

## 2021-02-03 NOTE — Progress Notes (Signed)
Per Dr. Irene Limbo, ok to treat with Scr of 1.88.

## 2021-02-04 ENCOUNTER — Inpatient Hospital Stay: Payer: BC Managed Care – PPO

## 2021-02-08 ENCOUNTER — Other Ambulatory Visit: Payer: Self-pay

## 2021-02-08 DIAGNOSIS — C9010 Plasma cell leukemia not having achieved remission: Secondary | ICD-10-CM

## 2021-02-08 MED ORDER — LENALIDOMIDE 10 MG PO CAPS: 10.0000 mg | ORAL_CAPSULE | Freq: Every day | ORAL | 0 refills | Status: DC

## 2021-02-18 ENCOUNTER — Other Ambulatory Visit: Payer: Self-pay | Admitting: Hematology

## 2021-02-18 ENCOUNTER — Inpatient Hospital Stay: Payer: BC Managed Care – PPO | Attending: Hematology

## 2021-02-18 ENCOUNTER — Other Ambulatory Visit: Payer: Self-pay

## 2021-02-18 ENCOUNTER — Inpatient Hospital Stay: Payer: BC Managed Care – PPO

## 2021-02-18 VITALS — BP 120/69 | HR 69 | Temp 98.1°F | Resp 18 | Wt 161.5 lb

## 2021-02-18 DIAGNOSIS — Z7189 Other specified counseling: Secondary | ICD-10-CM

## 2021-02-18 DIAGNOSIS — N289 Disorder of kidney and ureter, unspecified: Secondary | ICD-10-CM | POA: Insufficient documentation

## 2021-02-18 DIAGNOSIS — C9011 Plasma cell leukemia in remission: Secondary | ICD-10-CM

## 2021-02-18 DIAGNOSIS — N400 Enlarged prostate without lower urinary tract symptoms: Secondary | ICD-10-CM | POA: Insufficient documentation

## 2021-02-18 DIAGNOSIS — Z79899 Other long term (current) drug therapy: Secondary | ICD-10-CM | POA: Insufficient documentation

## 2021-02-18 DIAGNOSIS — C9031 Solitary plasmacytoma in remission: Secondary | ICD-10-CM | POA: Diagnosis present

## 2021-02-18 DIAGNOSIS — C9010 Plasma cell leukemia not having achieved remission: Secondary | ICD-10-CM

## 2021-02-18 DIAGNOSIS — I10 Essential (primary) hypertension: Secondary | ICD-10-CM | POA: Insufficient documentation

## 2021-02-18 DIAGNOSIS — Z5112 Encounter for antineoplastic immunotherapy: Secondary | ICD-10-CM | POA: Insufficient documentation

## 2021-02-18 DIAGNOSIS — D649 Anemia, unspecified: Secondary | ICD-10-CM | POA: Diagnosis not present

## 2021-02-18 DIAGNOSIS — Z7982 Long term (current) use of aspirin: Secondary | ICD-10-CM | POA: Diagnosis not present

## 2021-02-18 DIAGNOSIS — Z5111 Encounter for antineoplastic chemotherapy: Secondary | ICD-10-CM

## 2021-02-18 DIAGNOSIS — Z87891 Personal history of nicotine dependence: Secondary | ICD-10-CM | POA: Insufficient documentation

## 2021-02-18 LAB — CBC WITH DIFFERENTIAL/PLATELET
Abs Immature Granulocytes: 0.03 10*3/uL (ref 0.00–0.07)
Basophils Absolute: 0.1 10*3/uL (ref 0.0–0.1)
Basophils Relative: 1 %
Eosinophils Absolute: 0.4 10*3/uL (ref 0.0–0.5)
Eosinophils Relative: 6 %
HCT: 35.7 % — ABNORMAL LOW (ref 39.0–52.0)
Hemoglobin: 12.4 g/dL — ABNORMAL LOW (ref 13.0–17.0)
Immature Granulocytes: 1 %
Lymphocytes Relative: 20 %
Lymphs Abs: 1.2 10*3/uL (ref 0.7–4.0)
MCH: 31.8 pg (ref 26.0–34.0)
MCHC: 34.7 g/dL (ref 30.0–36.0)
MCV: 91.5 fL (ref 80.0–100.0)
Monocytes Absolute: 0.7 10*3/uL (ref 0.1–1.0)
Monocytes Relative: 12 %
Neutro Abs: 3.5 10*3/uL (ref 1.7–7.7)
Neutrophils Relative %: 60 %
Platelets: 205 10*3/uL (ref 150–400)
RBC: 3.9 MIL/uL — ABNORMAL LOW (ref 4.22–5.81)
RDW: 15.1 % (ref 11.5–15.5)
WBC: 5.9 10*3/uL (ref 4.0–10.5)
nRBC: 0 % (ref 0.0–0.2)

## 2021-02-18 LAB — CMP (CANCER CENTER ONLY)
ALT: 14 U/L (ref 0–44)
AST: 13 U/L — ABNORMAL LOW (ref 15–41)
Albumin: 3.9 g/dL (ref 3.5–5.0)
Alkaline Phosphatase: 81 U/L (ref 38–126)
Anion gap: 11 (ref 5–15)
BUN: 39 mg/dL — ABNORMAL HIGH (ref 8–23)
CO2: 17 mmol/L — ABNORMAL LOW (ref 22–32)
Calcium: 8.8 mg/dL — ABNORMAL LOW (ref 8.9–10.3)
Chloride: 112 mmol/L — ABNORMAL HIGH (ref 98–111)
Creatinine: 1.8 mg/dL — ABNORMAL HIGH (ref 0.61–1.24)
GFR, Estimated: 42 mL/min — ABNORMAL LOW (ref >60)
Glucose, Bld: 112 mg/dL — ABNORMAL HIGH (ref 70–99)
Potassium: 4.4 mmol/L (ref 3.5–5.1)
Sodium: 140 mmol/L (ref 135–145)
Total Bilirubin: 0.5 mg/dL (ref 0.3–1.2)
Total Protein: 7 g/dL (ref 6.5–8.1)

## 2021-02-18 MED ORDER — SODIUM CHLORIDE 0.9 % IV SOLN
Freq: Once | INTRAVENOUS | Status: AC
  Filled 2021-02-18: qty 250

## 2021-02-18 MED ORDER — FAMOTIDINE 20 MG PO TABS
ORAL_TABLET | ORAL | Status: AC
  Filled 2021-02-18: qty 1

## 2021-02-18 MED ORDER — ACETAMINOPHEN 325 MG PO TABS
ORAL_TABLET | ORAL | Status: AC
  Filled 2021-02-18: qty 2

## 2021-02-18 MED ORDER — FAMOTIDINE 20 MG PO TABS
20.0000 mg | ORAL_TABLET | Freq: Once | ORAL | Status: AC
  Administered 2021-02-18: 20 mg via ORAL

## 2021-02-18 MED ORDER — FAMOTIDINE IN NACL 20-0.9 MG/50ML-% IV SOLN
INTRAVENOUS | Status: AC
  Filled 2021-02-18: qty 50

## 2021-02-18 MED ORDER — ACETAMINOPHEN 325 MG PO TABS
650.0000 mg | ORAL_TABLET | Freq: Once | ORAL | Status: AC
  Administered 2021-02-18: 650 mg via ORAL

## 2021-02-18 MED ORDER — SODIUM CHLORIDE 0.9 % IV SOLN
INTRAVENOUS | Status: AC
  Filled 2021-02-18: qty 250

## 2021-02-18 MED ORDER — SODIUM CHLORIDE 0.9 % IV SOLN
20.0000 mg | Freq: Once | INTRAVENOUS | Status: AC
  Administered 2021-02-18: 20 mg via INTRAVENOUS
  Filled 2021-02-18: qty 20

## 2021-02-18 MED ORDER — DEXTROSE 5 % IV SOLN
56.0000 mg/m2 | Freq: Once | INTRAVENOUS | Status: AC
  Administered 2021-02-18: 100 mg via INTRAVENOUS
  Filled 2021-02-18: qty 30

## 2021-02-18 NOTE — Patient Instructions (Signed)
[facility] Discharge Instructions for Patients Receiving Chemotherapy  Today you received the following chemotherapy agents Kyprolis  To help prevent nausea and vomiting after your treatment, we encourage you to take your nausea medication as directed.  If you develop nausea and vomiting that is not controlled by your nausea medication, call the clinic.   BELOW ARE SYMPTOMS THAT SHOULD BE REPORTED IMMEDIATELY:  *FEVER GREATER THAN 100.5 F  *CHILLS WITH OR WITHOUT FEVER  NAUSEA AND VOMITING THAT IS NOT CONTROLLED WITH YOUR NAUSEA MEDICATION  *UNUSUAL SHORTNESS OF BREATH  *UNUSUAL BRUISING OR BLEEDING  TENDERNESS IN MOUTH AND THROAT WITH OR WITHOUT PRESENCE OF ULCERS  *URINARY PROBLEMS  *BOWEL PROBLEMS  UNUSUAL RASH Items with * indicate a potential emergency and should be followed up as soon as possible.  Feel free to call the clinic should you have any questions or concerns. The clinic phone number is [phone].  Please show the [place] at check-in to the Emergency Department and triage nurse.  Thank you for choosing [facility] to provide your oncology and hematology care.   Should you have questions after your visit to the [facility]), please contact this office at [phone] between 8:30 AM and 4:30 PM.  Voice mails left after 4:00 PM may not be returned until the following business day.  Calls received after 4:30 PM will be answered by an off-site Nurse Triage Line.    Prescription Refills:  Please have your pharmacy contact us directly for most prescription requests.  Contact the office directly for refills of narcotics (pain medications). Allow 48-72 hours for refills.  Appointments: Please contact the [facility] scheduling department [phone] for questions regarding [facility] appointment scheduling.  Contact the schedulers with any scheduling changes so that your appointment can be rescheduled in a timely  manner.   Central Scheduling for [facility] [phone] - Call to schedule procedures such as PET scans, CT scans, MRI, Ultrasound, etc.  To afford each patient quality time with our providers, please arrive 30 minutes before your scheduled appointment time.  If you arrive late for your appointment, you may be asked to reschedule.  We strive to give you quality time with our providers, and arriving late affects you and other patients whose appointments are after yours. If you are a no show for multiple scheduled visits, you may be dismissed from the clinic at the providers discretion.     Resources: [employer] [phone] for additional information on assistance programs or assistance connecting with community support programs   [employer]  [phone]: Information regarding food stamps, Medicaid, and utility assistance [employer]'s shared-ride transportation service for eligible riders who have a disability that prevents them from riding the fixed route bus.   [place] 220-495-3873 Helps people with Medicare understand their rights and benefits, navigate the Medicare system, and secure the quality healthcare they deserve [employer] [phone] Assists patients locate various types of support and financial assistance Cancer Care: 1-800-813-HOPE 7471032167) Provides financial assistance, online support groups, medication/co-pay assistance.   Transportation Assistance for appointments at [facility]: [facility] [employer] [phone]  Again, thank you for choosing [facility] for your care.

## 2021-02-18 NOTE — Progress Notes (Signed)
Per Dr. Irene Limbo ok to treat with creatnine of 1.8

## 2021-02-21 LAB — KAPPA/LAMBDA LIGHT CHAINS
Kappa free light chain: 25.1 mg/L — ABNORMAL HIGH (ref 3.3–19.4)
Kappa, lambda light chain ratio: 1.24 (ref 0.26–1.65)
Lambda free light chains: 20.3 mg/L (ref 5.7–26.3)

## 2021-03-03 NOTE — Progress Notes (Signed)
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 His labs today 12/14/2017 show hgb 8.3, platelets 524k. He is doing well overall. He has staples in his right upper leg following a recent surgery and has not been in touch with the surgeons for a f/u of this. He is not receiving physical therapy and is not getting around at home.  He notes that he was not scheduled for a post hospitalization nephrology f/u and has no PCP.  On review of systems, pt reports intermittent fatigue, SOB, pain to the left leg, dizziness, dry mouth and denies fever, chills, night sweats, dysuria and any other accompanying symptoms.   INTERVAL HISTORY:   Andres Fernandez presents today for follow up and continue management of his myeloma/plasma cell leukemia. The patient's last visit with [name] was on 01/07/2021. The pt reports that he is doing well overall.  The pt reports that he has been dealing with dizziness and a spinning feeling when he bends down. The pt notes it lasts from anywhere from 5-20 seconds at a time. He notes he has been eating well and drinking well, the same as before. The pt notes he has not had any other symptoms alongside this. The pt notes no issues tolerating the treatment. The pt notes he also stumbles intermittently, more often when he is more tired / fatigued. The pt notes he works six days a week.  Lab results today 03/04/2021 of CBC w/diff and CMP is as follows: all values are WNL except for RBC of 3.98, Hgb of 12.8, HCT of 37.4, CO2 of 19, BUN of 36, Creatinine of 2.06, Calcium of 8.8, AST of 10, GFR est of 36.  On review of systems, pt reports dizziness bending down, wobbly when tired and denies ear infection, ear issues, heart palpitations, leg  swelling, abdominal pain, back pain, and any other symptoms.  REVIEW OF SYSTEMS:   10 Point review of Systems was done is negative except as noted above.  MEDICAL HISTORY:  Past Medical History:  Diagnosis Date  . BPH (benign prostatic hyperplasia)   . Hepatitis C 11/27/2017  . Hypertension   . Plasma cell leukemia ([place]) 11/23/2017  . Tobacco abuse     SURGICAL HISTORY: Past Surgical History:  Procedure Laterality Date  . APPENDECTOMY    . FEMUR IM NAIL Right 11/20/2017   Procedure: RIGHT FEMORAL INTRAMEDULLARY (IM) NAIL;  Surgeon: Nicholes Stairs, MD;  Location: [facility];  Service: Orthopedics;  Laterality: Right;    SOCIAL HISTORY: Social History   Socioeconomic History  . Marital status: Single    Spouse name: Not on file  . Number of children: Not on file  . Years of education: Not on file  . Highest education level: Not on file  Occupational History  . Not on file  Tobacco Use  . Smoking status: Current Every Day Smoker    Packs/day: 0.25    Types: Cigarettes    Last attempt to quit: 11/09/2017    Years since quitting: 3.3  . Smokeless tobacco: Never Used  . Tobacco comment: 1 cigarette/day as of 09/25/18  Vaping Use  . Vaping Use: Never used  Substance and Sexual Activity  . Alcohol use: Yes  . Drug use: No  . Sexual activity:  Not on file  Other Topics Concern  . Not on file  Social History Narrative  . Not on file   Social Determinants of Health   Financial Resource Strain: Not on file  Food Insecurity: Not on file  Transportation Needs: Not on file  Physical Activity: Not on file  Stress: Not on file  Social Connections: Not on file  Intimate Partner Violence: Not on file    FAMILY HISTORY: Family History  Problem Relation Age of Onset  . COPD Mother   . Liver cancer Mother     ALLERGIES:  has No Known Allergies.  MEDICATIONS:  . Current Outpatient Medications on File Prior to Visit  Medication Sig Dispense Refill  . acyclovir  (ZOVIRAX) 400 MG tablet Take 1 tablet (400 mg total) by mouth daily. (Patient taking differently: Take 400 mg by mouth at bedtime. ) 30 tablet 11  . Albuterol Sulfate (PROAIR RESPICLICK) 962 (90 Base) MCG/ACT AEPB Inhale into the lungs.    [name] aspirin EC 81 MG tablet Take 1 tablet (81 mg total) by mouth daily. (Patient taking differently: Take 81 mg by mouth at bedtime. ) 60 tablet 11  . cholecalciferol (VITAMIN D) 400 units TABS tablet Take 400 Units by mouth at bedtime.     [name] lenalidomide (REVLIMID) 10 MG capsule Take 1 capsule (10 mg total) by mouth daily. Take for 21 days on, 7 days off, repeat every 28 days. 21 capsule 0  . Multiple Vitamins-Minerals (MULTIVITAMIN WITH MINERALS) tablet Take 1 tablet by mouth at bedtime.     . nicotine (NICODERM CQ - DOSED IN MG/24 HOURS) 14 mg/24hr patch PLACE 1 PATCH (14 MG TOTAL) ONTO THE SKIN DAILY. (Patient taking differently: Place 14 mg onto the skin daily. ) 28 patch 1  . ondansetron (ZOFRAN) 4 MG tablet Take 2 tablets (8 mg total) by mouth every 8 (eight) hours as needed for nausea. 30 tablet 0  . tiZANidine (ZANAFLEX) 2 MG tablet Take 1 tablet (2 mg total) by mouth every 8 (eight) hours as needed for muscle spasms. 30 tablet 0  . Vitamin D, Ergocalciferol, (DRISDOL) 1.25 MG (50000 UNIT) CAPS capsule Take 1 capsule (50,000 Units total) by mouth once a week. (Patient taking differently: Take 50,000 Units by mouth every Thursday. ) 12 capsule 6   No current facility-administered medications on file prior to visit.    PHYSICAL EXAMINATION:  ECOG FS:1 - Symptomatic but completely ambulatory  Vitals:   03/04/21 1121  BP: 118/75  Pulse: 73  Resp: 18  Temp: (!) 97 F (36.1 C)  SpO2: 100%   Wt Readings from Last 3 Encounters:  03/04/21 160 lb 4.8 oz (72.7 kg)  02/18/21 161 lb 8 oz (73.3 kg)  01/07/21 159 lb 11.2 oz (72.4 kg)   Body mass index is 24.37 kg/m.    GENERAL:alert, in no acute distress and comfortable SKIN: no acute rashes, no  significant lesions EYES: conjunctiva are pink and non-injected, sclera anicteric OROPHARYNX: MMM, no exudates, no oropharyngeal erythema or ulceration NECK: supple, no JVD LYMPH:  no palpable lymphadenopathy in the cervical, axillary or inguinal regions LUNGS: clear to auscultation b/l with normal respiratory effort HEART: regular rate & rhythm ABDOMEN:  normoactive bowel sounds , non tender, not distended. Extremity: no pedal edema PSYCH: alert & oriented x 3 with fluent speech NEURO: no focal motor/sensory deficits  LABORATORY DATA:  I have reviewed the data as listed  CBC Latest Ref Rng & Units 03/04/2021 02/18/2021 02/03/2021  WBC 4.0 -  10.5 K/uL 6.4 5.9 7.6  Hemoglobin 13.0 - 17.0 g/dL 12.8(L) 12.4(L) 12.2(L)  Hematocrit 39.0 - 52.0 % 37.4(L) 35.7(L) 35.6(L)  Platelets 150 - 400 K/uL 176 205 196   CBC    Component Value Date/Time   WBC 6.4 03/04/2021 1026   RBC 3.98 (L) 03/04/2021 1026   HGB 12.8 (L) 03/04/2021 1026   HGB 11.9 (L) 08/27/2019 0922   HGB 8.3 (L) 12/14/2017 1230   HCT 37.4 (L) 03/04/2021 1026   HCT 23.9 (L) 12/14/2017 1230   PLT 176 03/04/2021 1026   PLT 203 08/27/2019 0922   PLT 524 (H) 12/14/2017 1230   MCV 94.0 03/04/2021 1026   MCV 88.4 12/14/2017 1230   MCH 32.2 03/04/2021 1026   MCHC 34.2 03/04/2021 1026   RDW 15.3 03/04/2021 1026   RDW 15.3 (H) 12/14/2017 1230   LYMPHSABS 1.8 03/04/2021 1026   LYMPHSABS 1.4 12/14/2017 1230   MONOABS 0.7 03/04/2021 1026   MONOABS 0.6 12/14/2017 1230   EOSABS 0.5 03/04/2021 1026   EOSABS 0.1 12/14/2017 1230   BASOSABS 0.1 03/04/2021 1026   BASOSABS 0.1 12/14/2017 1230     CMP Latest Ref Rng & Units 03/04/2021 02/18/2021 02/03/2021  Glucose 70 - 99 mg/dL 93 112(H) 112(H)  BUN 8 - 23 mg/dL 36(H) 39(H) 36(H)  Creatinine 0.61 - 1.24 mg/dL 2.06(H) 1.80(H) 1.88(H)  Sodium 135 - 145 mmol/L 143 140 138  Potassium 3.5 - 5.1 mmol/L 4.0 4.4 4.1  Chloride 98 - 111 mmol/L 109 112(H) 109  CO2 22 - 32 mmol/L 19(L) 17(L)  19(L)  Calcium 8.9 - 10.3 mg/dL 8.8(L) 8.8(L) 8.5(L)  Total Protein 6.5 - 8.1 g/dL 7.2 7.0 7.0  Total Bilirubin 0.3 - 1.2 mg/dL 0.4 0.5 0.4  Alkaline Phos 38 - 126 U/L 78 81 77  AST 15 - 41 U/L 10(L) 13(L) 11(L)  ALT 0 - 44 U/L _0 03/09/20 ([id number]) Bone Marrow Biopsy at [facility]   07/02/2019 Bone Marrow Biopsy ([id number]) at [facility]    08/07/18 BM Bx:       RADIOGRAPHIC STUDIES: I have personally reviewed the radiological images as listed and agreed with the findings in the report. No results found.  ASSESSMENT & PLAN:   65 y.o. 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   Initial BM Bx showed >95% involvement with kappa restricted serum free light chains. 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 MoL Cy-   08/07/18 BM Bx revealed normocellular bone marrow with trilineage hematopoiesis and 1% plasma cells which indicates the pt is in remission 08/06/18 PET/CT revealed  Expansile lytic lesion involving the left iliac bone, unchanged. Lytic lesion involving the sternum, unchanged. While both are suspicious for myeloma, neither lesion is FDG avid. No hypermetabolic osseous lesions in the visualized axial and appendicular skeleton. Please note that the bilateral lower extremities were not imaged. No suspicious lymphadenopathy. Spleen is normal in size.    Pt began Ninlaro 15m maintenance after pt finished 13 cycles of CyBorD and BM bx showed only 1% plasma cells, then resumed CyBorD and held Ninlaro in anticipation of his tandem bone marrow transplants beginning in late December 2019 [facility]  11/19/18 High  dose chemotherapy  with Melphalan 133m/m2 and autologous stem cell rescue  03/20/19 High dose chemotherapy with Melphalan 2069mm2 and autologous stem cell rescue. Non-infectious diarrhea, uncomplicated course otherwise.  07/02/2019 PET scan with results revealing "1. No FDG avid bone lesions are identified. 2.  Asymmetric uptake within the right, greater than left, palatine tonsils with mild fullness of the right tonsillar soft tissue. ENT consult vs attention on follow up suggested. 3.  Innumerable mixed sclerotic and lytic lesions throughout the axial and appendicular skeleton without associated hypermetabolic activity. 4.  Ancillary CT findings as above."  07/02/2019 bone marrow biopsy ([id number])with results showing: "Trilineage hematopoieses with maturation. No plasma cell myeloma identified. Mild anemia and No Rouleaux or circulating plasma cells identified in the peripheral blood."  2) s/p Prophylactic IM nailing for rt femur  [facility] -patient previously reported improvement and is currently progressively back to full time work.  3) Thrombocytopenia resolved   4) Renal insufficiency -- likely primarily related to Myeloma. stable Plan -maintain follow up with nephrology referral to Dr CoMarval RegalCNarda Amberidney for continued treatment -Pt completed 8 weeks of maverick for hep c with Dr CoLinus Salmonsn 07/08/18.  -Kidney numbers improved. Creatinine in the 1.7-2.2 range   5) h/o tobacco abuse-counseled on smoking cessation -Currently not smoking.  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 -Hgb improved/stable   -monitor  PLAN: -Discussed pt labwork today, 03/04/2021; blood counts normal, blood chemistries stable. -Advised pt that his dizziness is most likely to be benign positional vertigo. Discussed potential of medication if very bothersome or referral to ENT. -Recommended pt  go down straight if needing to bend down. -Recommended pt drink 48-64 oz water daily. -The pt has no prohibitive toxicities from continuing Carfilzomib every two weeks at this time. -The pt has no prohibitive toxicities from continuing 10 mg Revlimid 3 weeks on, 1 week off at this time. -Continue daily ASA and Vitamin D. -Continue Pamidronate q8weeks. -Will see back in 8 weeks with labs.   FOLLOW UP: -Plz schedule next 6 doses of Carfilzomib with labs every 2 weeks as ordered -MD visit in 8 weeks   The total time spent in the appointment was 20 minutes and more than 50% was on counseling and direct patient cares.  All of the patient's questions were answered with apparent satisfaction. The patient knows to call the clinic with any problems, questions or concerns.   GaSullivan LoneD MSMercerAHIVMS [facility] CampusTKanakanak Hospitalematology/Oncology Physician [facility](Office):       [phone] cell):  [phone]):           [phone], RoReinaldo Raddleam acting as scribe for Dr. GaSullivan LoneMD.    .I have reviewed the above documentation for accuracy and completeness, and I agree with the above. .GBrunetta GeneraD

## 2021-03-04 ENCOUNTER — Inpatient Hospital Stay (HOSPITAL_BASED_OUTPATIENT_CLINIC_OR_DEPARTMENT_OTHER): Payer: BC Managed Care – PPO | Admitting: Hematology

## 2021-03-04 ENCOUNTER — Inpatient Hospital Stay: Payer: BC Managed Care – PPO

## 2021-03-04 ENCOUNTER — Other Ambulatory Visit: Payer: Self-pay

## 2021-03-04 VITALS — BP 118/75 | HR 73 | Temp 97.0°F | Resp 18 | Wt 160.3 lb

## 2021-03-04 DIAGNOSIS — Z5111 Encounter for antineoplastic chemotherapy: Secondary | ICD-10-CM

## 2021-03-04 DIAGNOSIS — C9010 Plasma cell leukemia not having achieved remission: Secondary | ICD-10-CM | POA: Diagnosis not present

## 2021-03-04 DIAGNOSIS — C9031 Solitary plasmacytoma in remission: Secondary | ICD-10-CM | POA: Diagnosis not present

## 2021-03-04 DIAGNOSIS — Z7189 Other specified counseling: Secondary | ICD-10-CM

## 2021-03-04 DIAGNOSIS — C9011 Plasma cell leukemia in remission: Secondary | ICD-10-CM

## 2021-03-04 LAB — CBC WITH DIFFERENTIAL/PLATELET
Abs Immature Granulocytes: 0.01 10*3/uL (ref 0.00–0.07)
Basophils Absolute: 0.1 10*3/uL (ref 0.0–0.1)
Basophils Relative: 1 %
Eosinophils Absolute: 0.5 10*3/uL (ref 0.0–0.5)
Eosinophils Relative: 8 %
HCT: 37.4 % — ABNORMAL LOW (ref 39.0–52.0)
Hemoglobin: 12.8 g/dL — ABNORMAL LOW (ref 13.0–17.0)
Immature Granulocytes: 0 %
Lymphocytes Relative: 29 %
Lymphs Abs: 1.8 10*3/uL (ref 0.7–4.0)
MCH: 32.2 pg (ref 26.0–34.0)
MCHC: 34.2 g/dL (ref 30.0–36.0)
MCV: 94 fL (ref 80.0–100.0)
Monocytes Absolute: 0.7 10*3/uL (ref 0.1–1.0)
Monocytes Relative: 11 %
Neutro Abs: 3.3 10*3/uL (ref 1.7–7.7)
Neutrophils Relative %: 51 %
Platelets: 176 10*3/uL (ref 150–400)
RBC: 3.98 MIL/uL — ABNORMAL LOW (ref 4.22–5.81)
RDW: 15.3 % (ref 11.5–15.5)
WBC: 6.4 10*3/uL (ref 4.0–10.5)
nRBC: 0 % (ref 0.0–0.2)

## 2021-03-04 LAB — CMP (CANCER CENTER ONLY)
ALT: 14 U/L (ref 0–44)
AST: 10 U/L — ABNORMAL LOW (ref 15–41)
Albumin: 4 g/dL (ref 3.5–5.0)
Alkaline Phosphatase: 78 U/L (ref 38–126)
Anion gap: 15 (ref 5–15)
BUN: 36 mg/dL — ABNORMAL HIGH (ref 8–23)
CO2: 19 mmol/L — ABNORMAL LOW (ref 22–32)
Calcium: 8.8 mg/dL — ABNORMAL LOW (ref 8.9–10.3)
Chloride: 109 mmol/L (ref 98–111)
Creatinine: 2.06 mg/dL — ABNORMAL HIGH (ref 0.61–1.24)
GFR, Estimated: 36 mL/min — ABNORMAL LOW (ref >60)
Glucose, Bld: 93 mg/dL (ref 70–99)
Potassium: 4 mmol/L (ref 3.5–5.1)
Sodium: 143 mmol/L (ref 135–145)
Total Bilirubin: 0.4 mg/dL (ref 0.3–1.2)
Total Protein: 7.2 g/dL (ref 6.5–8.1)

## 2021-03-04 MED ORDER — ACETAMINOPHEN 325 MG PO TABS
650.0000 mg | ORAL_TABLET | Freq: Once | ORAL | Status: AC
  Administered 2021-03-04: 650 mg via ORAL

## 2021-03-04 MED ORDER — ACETAMINOPHEN 325 MG PO TABS
ORAL_TABLET | ORAL | Status: AC
  Filled 2021-03-04: qty 2

## 2021-03-04 MED ORDER — SODIUM CHLORIDE 0.9 % IV SOLN
Freq: Once | INTRAVENOUS | Status: AC
Start: 2021-03-04 — End: 2021-03-04
  Filled 2021-03-04: qty 250

## 2021-03-04 MED ORDER — SODIUM CHLORIDE 0.9 % IV SOLN
20.0000 mg | Freq: Once | INTRAVENOUS | Status: AC
  Administered 2021-03-04: 20 mg via INTRAVENOUS
  Filled 2021-03-04: qty 20

## 2021-03-04 MED ORDER — DEXTROSE 5 % IV SOLN
56.0000 mg/m2 | Freq: Once | INTRAVENOUS | Status: AC
  Administered 2021-03-04: 100 mg via INTRAVENOUS
  Filled 2021-03-04: qty 30

## 2021-03-04 MED ORDER — FAMOTIDINE 20 MG PO TABS
ORAL_TABLET | ORAL | Status: AC
  Filled 2021-03-04: qty 1

## 2021-03-04 MED ORDER — SODIUM CHLORIDE 0.9 % IV SOLN
Freq: Once | INTRAVENOUS | Status: AC
  Filled 2021-03-04: qty 250

## 2021-03-04 MED ORDER — FAMOTIDINE 20 MG PO TABS
20.0000 mg | ORAL_TABLET | Freq: Once | ORAL | Status: AC
  Administered 2021-03-04: 20 mg via ORAL

## 2021-03-04 MED ORDER — SODIUM CHLORIDE 0.9 % IV SOLN
INTRAVENOUS | Status: AC
  Filled 2021-03-04 (×2): qty 250

## 2021-03-04 NOTE — Progress Notes (Signed)
Ok to treat with Scr of 2.06 per Dr.Kale.

## 2021-03-04 NOTE — Patient Instructions (Signed)
[facility] Discharge Instructions for Patients Receiving Chemotherapy  Today you received the following chemotherapy agents:  Kyprolis  To help prevent nausea and vomiting after your treatment, we encourage you to take your nausea medication as directed.   If you develop nausea and vomiting that is not controlled by your nausea medication, call the clinic.   BELOW ARE SYMPTOMS THAT SHOULD BE REPORTED IMMEDIATELY:  *FEVER GREATER THAN 100.5 F  *CHILLS WITH OR WITHOUT FEVER  NAUSEA AND VOMITING THAT IS NOT CONTROLLED WITH YOUR NAUSEA MEDICATION  *UNUSUAL SHORTNESS OF BREATH  *UNUSUAL BRUISING OR BLEEDING  TENDERNESS IN MOUTH AND THROAT WITH OR WITHOUT PRESENCE OF ULCERS  *URINARY PROBLEMS  *BOWEL PROBLEMS  UNUSUAL RASH Items with * indicate a potential emergency and should be followed up as soon as possible.  Feel free to call the clinic should you have any questions or concerns. The clinic phone number is [phone].  Please show the CHEMO ALERT CARD at check-in to the Emergency Department and triage nurse.   

## 2021-03-07 ENCOUNTER — Telehealth: Payer: Self-pay | Admitting: Hematology

## 2021-03-07 NOTE — Telephone Encounter (Signed)
Scheduled follow-up appointments per 3/25 los. Patient is aware.

## 2021-03-08 ENCOUNTER — Telehealth: Payer: Self-pay | Admitting: Hematology

## 2021-03-08 NOTE — Telephone Encounter (Signed)
Left message with follow-up appointment per 3/25 los. Gave option to call back to reschedule if needed.

## 2021-03-09 ENCOUNTER — Other Ambulatory Visit: Payer: Self-pay | Admitting: *Deleted

## 2021-03-09 DIAGNOSIS — C9010 Plasma cell leukemia not having achieved remission: Secondary | ICD-10-CM

## 2021-03-09 MED ORDER — LENALIDOMIDE 10 MG PO CAPS: 10.0000 mg | ORAL_CAPSULE | Freq: Every day | ORAL | 0 refills | Status: DC

## 2021-03-10 ENCOUNTER — Other Ambulatory Visit: Payer: Self-pay

## 2021-03-10 DIAGNOSIS — C9010 Plasma cell leukemia not having achieved remission: Secondary | ICD-10-CM

## 2021-03-10 MED ORDER — LENALIDOMIDE 10 MG PO CAPS: 10.0000 mg | ORAL_CAPSULE | Freq: Every day | ORAL | 0 refills | Status: DC

## 2021-03-18 ENCOUNTER — Inpatient Hospital Stay: Payer: BC Managed Care – PPO | Attending: Hematology

## 2021-03-18 ENCOUNTER — Inpatient Hospital Stay: Payer: BC Managed Care – PPO

## 2021-03-18 ENCOUNTER — Other Ambulatory Visit: Payer: Self-pay

## 2021-03-18 VITALS — BP 122/82 | HR 75 | Temp 97.8°F | Resp 18 | Wt 161.5 lb

## 2021-03-18 DIAGNOSIS — F17210 Nicotine dependence, cigarettes, uncomplicated: Secondary | ICD-10-CM | POA: Diagnosis not present

## 2021-03-18 DIAGNOSIS — C9010 Plasma cell leukemia not having achieved remission: Secondary | ICD-10-CM

## 2021-03-18 DIAGNOSIS — Z79899 Other long term (current) drug therapy: Secondary | ICD-10-CM | POA: Diagnosis not present

## 2021-03-18 DIAGNOSIS — Z5111 Encounter for antineoplastic chemotherapy: Secondary | ICD-10-CM

## 2021-03-18 DIAGNOSIS — Z5112 Encounter for antineoplastic immunotherapy: Secondary | ICD-10-CM | POA: Insufficient documentation

## 2021-03-18 DIAGNOSIS — D638 Anemia in other chronic diseases classified elsewhere: Secondary | ICD-10-CM | POA: Diagnosis not present

## 2021-03-18 DIAGNOSIS — Z800 Family history of malignant neoplasm of digestive organs: Secondary | ICD-10-CM | POA: Insufficient documentation

## 2021-03-18 DIAGNOSIS — Z7189 Other specified counseling: Secondary | ICD-10-CM

## 2021-03-18 DIAGNOSIS — N289 Disorder of kidney and ureter, unspecified: Secondary | ICD-10-CM | POA: Insufficient documentation

## 2021-03-18 DIAGNOSIS — Z836 Family history of other diseases of the respiratory system: Secondary | ICD-10-CM | POA: Diagnosis not present

## 2021-03-18 DIAGNOSIS — C9031 Solitary plasmacytoma in remission: Secondary | ICD-10-CM | POA: Diagnosis not present

## 2021-03-18 DIAGNOSIS — Z9484 Stem cells transplant status: Secondary | ICD-10-CM

## 2021-03-18 DIAGNOSIS — C9011 Plasma cell leukemia in remission: Secondary | ICD-10-CM

## 2021-03-18 DIAGNOSIS — M84551A Pathological fracture in neoplastic disease, right femur, initial encounter for fracture: Secondary | ICD-10-CM

## 2021-03-18 LAB — CMP (CANCER CENTER ONLY)
ALT: 15 U/L (ref 0–44)
AST: 12 U/L — ABNORMAL LOW (ref 15–41)
Albumin: 3.9 g/dL (ref 3.5–5.0)
Alkaline Phosphatase: 66 U/L (ref 38–126)
Anion gap: 12 (ref 5–15)
BUN: 31 mg/dL — ABNORMAL HIGH (ref 8–23)
CO2: 18 mmol/L — ABNORMAL LOW (ref 22–32)
Calcium: 8.6 mg/dL — ABNORMAL LOW (ref 8.9–10.3)
Chloride: 113 mmol/L — ABNORMAL HIGH (ref 98–111)
Creatinine: 1.73 mg/dL — ABNORMAL HIGH (ref 0.61–1.24)
GFR, Estimated: 44 mL/min — ABNORMAL LOW (ref >60)
Glucose, Bld: 109 mg/dL — ABNORMAL HIGH (ref 70–99)
Potassium: 4 mmol/L (ref 3.5–5.1)
Sodium: 143 mmol/L (ref 135–145)
Total Bilirubin: 0.6 mg/dL (ref 0.3–1.2)
Total Protein: 6.8 g/dL (ref 6.5–8.1)

## 2021-03-18 LAB — CBC WITH DIFFERENTIAL/PLATELET
Abs Immature Granulocytes: 0.02 10*3/uL (ref 0.00–0.07)
Basophils Absolute: 0.2 10*3/uL — ABNORMAL HIGH (ref 0.0–0.1)
Basophils Relative: 3 %
Eosinophils Absolute: 0.1 10*3/uL (ref 0.0–0.5)
Eosinophils Relative: 2 %
HCT: 35.7 % — ABNORMAL LOW (ref 39.0–52.0)
Hemoglobin: 12.5 g/dL — ABNORMAL LOW (ref 13.0–17.0)
Immature Granulocytes: 0 %
Lymphocytes Relative: 25 %
Lymphs Abs: 1.5 10*3/uL (ref 0.7–4.0)
MCH: 32.4 pg (ref 26.0–34.0)
MCHC: 35 g/dL (ref 30.0–36.0)
MCV: 92.5 fL (ref 80.0–100.0)
Monocytes Absolute: 0.7 10*3/uL (ref 0.1–1.0)
Monocytes Relative: 11 %
Neutro Abs: 3.6 10*3/uL (ref 1.7–7.7)
Neutrophils Relative %: 59 %
Platelets: 186 10*3/uL (ref 150–400)
RBC: 3.86 MIL/uL — ABNORMAL LOW (ref 4.22–5.81)
RDW: 15.2 % (ref 11.5–15.5)
WBC: 6.1 10*3/uL (ref 4.0–10.5)
nRBC: 0 % (ref 0.0–0.2)

## 2021-03-18 MED ORDER — FAMOTIDINE 20 MG PO TABS
20.0000 mg | ORAL_TABLET | Freq: Once | ORAL | Status: AC
  Administered 2021-03-18: 20 mg via ORAL

## 2021-03-18 MED ORDER — FAMOTIDINE 20 MG PO TABS
ORAL_TABLET | ORAL | Status: AC
  Filled 2021-03-18: qty 1

## 2021-03-18 MED ORDER — ACETAMINOPHEN 325 MG PO TABS
ORAL_TABLET | ORAL | Status: AC
  Filled 2021-03-18: qty 2

## 2021-03-18 MED ORDER — SODIUM CHLORIDE 0.9 % IV SOLN
Freq: Once | INTRAVENOUS | Status: AC
  Filled 2021-03-18: qty 250

## 2021-03-18 MED ORDER — SODIUM CHLORIDE 0.9 % IV SOLN
60.0000 mg | Freq: Once | INTRAVENOUS | Status: AC
  Administered 2021-03-18: 60 mg via INTRAVENOUS
  Filled 2021-03-18: qty 10

## 2021-03-18 MED ORDER — DEXTROSE 5 % IV SOLN
56.0000 mg/m2 | Freq: Once | INTRAVENOUS | Status: AC
  Administered 2021-03-18: 100 mg via INTRAVENOUS
  Filled 2021-03-18: qty 30

## 2021-03-18 MED ORDER — SODIUM CHLORIDE 0.9 % IV SOLN
20.0000 mg | Freq: Once | INTRAVENOUS | Status: AC
  Administered 2021-03-18: 20 mg via INTRAVENOUS
  Filled 2021-03-18: qty 20

## 2021-03-18 MED ORDER — SODIUM CHLORIDE 0.9 % IV SOLN
INTRAVENOUS | Status: AC
  Filled 2021-03-18: qty 250

## 2021-03-18 MED ORDER — ACETAMINOPHEN 325 MG PO TABS
650.0000 mg | ORAL_TABLET | Freq: Once | ORAL | Status: AC
  Administered 2021-03-18: 650 mg via ORAL

## 2021-03-18 NOTE — Patient Instructions (Signed)
[facility] Discharge Instructions for Patients Receiving Chemotherapy  Today you received the following chemotherapy agents:  Kyprolis  To help prevent nausea and vomiting after your treatment, we encourage you to take your nausea medication as directed.   If you develop nausea and vomiting that is not controlled by your nausea medication, call the clinic.   BELOW ARE SYMPTOMS THAT SHOULD BE REPORTED IMMEDIATELY:  *FEVER GREATER THAN 100.5 F  *CHILLS WITH OR WITHOUT FEVER  NAUSEA AND VOMITING THAT IS NOT CONTROLLED WITH YOUR NAUSEA MEDICATION  *UNUSUAL SHORTNESS OF BREATH  *UNUSUAL BRUISING OR BLEEDING  TENDERNESS IN MOUTH AND THROAT WITH OR WITHOUT PRESENCE OF ULCERS  *URINARY PROBLEMS  *BOWEL PROBLEMS  UNUSUAL RASH Items with * indicate a potential emergency and should be followed up as soon as possible.  Feel free to call the clinic should you have any questions or concerns. The clinic phone number is [phone].  Please show the CHEMO ALERT CARD at check-in to the Emergency Department and triage nurse.   

## 2021-03-18 NOTE — Progress Notes (Signed)
Per Dr. Irene Limbo, okay to proceed with creatine 1.73.

## 2021-03-18 NOTE — Progress Notes (Signed)
Ca=8.6 per Dr Irene Limbo ok to give pamidronate.

## 2021-03-21 LAB — KAPPA/LAMBDA LIGHT CHAINS
Kappa free light chain: 23 mg/L — ABNORMAL HIGH (ref 3.3–19.4)
Kappa, lambda light chain ratio: 1.28 (ref 0.26–1.65)
Lambda free light chains: 17.9 mg/L (ref 5.7–26.3)

## 2021-03-21 LAB — MULTIPLE MYELOMA PANEL, SERUM
Albumin SerPl Elph-Mcnc: 4 g/dL (ref 2.9–4.4)
Albumin/Glob SerPl: 1.5 (ref 0.7–1.7)
Alpha 1: 0.2 g/dL (ref 0.0–0.4)
Alpha2 Glob SerPl Elph-Mcnc: 1 g/dL (ref 0.4–1.0)
B-Globulin SerPl Elph-Mcnc: 0.8 g/dL (ref 0.7–1.3)
Gamma Glob SerPl Elph-Mcnc: 0.6 g/dL (ref 0.4–1.8)
Globulin, Total: 2.7 g/dL (ref 2.2–3.9)
IgA: 79 mg/dL (ref 61–437)
IgG (Immunoglobin G), Serum: 716 mg/dL (ref 603–1613)
IgM (Immunoglobulin M), Srm: <5 mg/dL — ABNORMAL LOW (ref 20–172)
Total Protein ELP: 6.7 g/dL (ref 6.0–8.5)

## 2021-04-01 ENCOUNTER — Inpatient Hospital Stay: Payer: BC Managed Care – PPO

## 2021-04-01 ENCOUNTER — Other Ambulatory Visit: Payer: Self-pay

## 2021-04-01 VITALS — BP 125/74 | HR 78 | Temp 97.8°F | Resp 18

## 2021-04-01 DIAGNOSIS — Z5111 Encounter for antineoplastic chemotherapy: Secondary | ICD-10-CM

## 2021-04-01 DIAGNOSIS — C9010 Plasma cell leukemia not having achieved remission: Secondary | ICD-10-CM

## 2021-04-01 DIAGNOSIS — C9031 Solitary plasmacytoma in remission: Secondary | ICD-10-CM | POA: Diagnosis not present

## 2021-04-01 DIAGNOSIS — Z7189 Other specified counseling: Secondary | ICD-10-CM

## 2021-04-01 DIAGNOSIS — C9011 Plasma cell leukemia in remission: Secondary | ICD-10-CM

## 2021-04-01 LAB — CBC WITH DIFFERENTIAL/PLATELET
Abs Immature Granulocytes: 0.01 10*3/uL (ref 0.00–0.07)
Basophils Absolute: 0.1 10*3/uL (ref 0.0–0.1)
Basophils Relative: 1 %
Eosinophils Absolute: 0.2 10*3/uL (ref 0.0–0.5)
Eosinophils Relative: 4 %
HCT: 35.4 % — ABNORMAL LOW (ref 39.0–52.0)
Hemoglobin: 12 g/dL — ABNORMAL LOW (ref 13.0–17.0)
Immature Granulocytes: 0 %
Lymphocytes Relative: 26 %
Lymphs Abs: 1.1 10*3/uL (ref 0.7–4.0)
MCH: 32.3 pg (ref 26.0–34.0)
MCHC: 33.9 g/dL (ref 30.0–36.0)
MCV: 95.2 fL (ref 80.0–100.0)
Monocytes Absolute: 0.7 10*3/uL (ref 0.1–1.0)
Monocytes Relative: 16 %
Neutro Abs: 2.2 10*3/uL (ref 1.7–7.7)
Neutrophils Relative %: 53 %
Platelets: 145 10*3/uL — ABNORMAL LOW (ref 150–400)
RBC: 3.72 MIL/uL — ABNORMAL LOW (ref 4.22–5.81)
RDW: 15.4 % (ref 11.5–15.5)
WBC: 4.2 10*3/uL (ref 4.0–10.5)
nRBC: 0 % (ref 0.0–0.2)

## 2021-04-01 LAB — CMP (CANCER CENTER ONLY)
ALT: 11 U/L (ref 0–44)
AST: 14 U/L — ABNORMAL LOW (ref 15–41)
Albumin: 3.8 g/dL (ref 3.5–5.0)
Alkaline Phosphatase: 65 U/L (ref 38–126)
Anion gap: 9 (ref 5–15)
BUN: 20 mg/dL (ref 8–23)
CO2: 20 mmol/L — ABNORMAL LOW (ref 22–32)
Calcium: 8.5 mg/dL — ABNORMAL LOW (ref 8.9–10.3)
Chloride: 112 mmol/L — ABNORMAL HIGH (ref 98–111)
Creatinine: 1.62 mg/dL — ABNORMAL HIGH (ref 0.61–1.24)
GFR, Estimated: 47 mL/min — ABNORMAL LOW (ref >60)
Glucose, Bld: 84 mg/dL (ref 70–99)
Potassium: 3.8 mmol/L (ref 3.5–5.1)
Sodium: 141 mmol/L (ref 135–145)
Total Bilirubin: 0.5 mg/dL (ref 0.3–1.2)
Total Protein: 6.7 g/dL (ref 6.5–8.1)

## 2021-04-01 MED ORDER — SODIUM CHLORIDE 0.9 % IV SOLN
Freq: Once | INTRAVENOUS | Status: DC
  Filled 2021-04-01: qty 250

## 2021-04-01 MED ORDER — FAMOTIDINE 20 MG PO TABS
ORAL_TABLET | ORAL | Status: AC
  Filled 2021-04-01: qty 1

## 2021-04-01 MED ORDER — FAMOTIDINE 20 MG PO TABS
20.0000 mg | ORAL_TABLET | Freq: Once | ORAL | Status: AC
  Administered 2021-04-01: 20 mg via ORAL

## 2021-04-01 MED ORDER — ACETAMINOPHEN 325 MG PO TABS
ORAL_TABLET | ORAL | Status: AC
  Filled 2021-04-01: qty 2

## 2021-04-01 MED ORDER — FAMOTIDINE IN NACL 20-0.9 MG/50ML-% IV SOLN
INTRAVENOUS | Status: AC
  Filled 2021-04-01: qty 50

## 2021-04-01 MED ORDER — DEXTROSE 5 % IV SOLN
56.0000 mg/m2 | Freq: Once | INTRAVENOUS | Status: AC
  Administered 2021-04-01: 100 mg via INTRAVENOUS
  Filled 2021-04-01: qty 15

## 2021-04-01 MED ORDER — DEXAMETHASONE SODIUM PHOSPHATE 100 MG/10ML IJ SOLN
20.0000 mg | Freq: Once | INTRAMUSCULAR | Status: AC
  Administered 2021-04-01: 20 mg via INTRAVENOUS
  Filled 2021-04-01: qty 20

## 2021-04-01 MED ORDER — SODIUM CHLORIDE 0.9 % IV SOLN
INTRAVENOUS | Status: AC
  Filled 2021-04-01 (×2): qty 250

## 2021-04-01 MED ORDER — ACETAMINOPHEN 325 MG PO TABS
650.0000 mg | ORAL_TABLET | Freq: Once | ORAL | Status: AC
  Administered 2021-04-01: 650 mg via ORAL

## 2021-04-01 NOTE — Progress Notes (Signed)
Per Dr. Lorenso Courier, ok to treat with Creat 1.62

## 2021-04-01 NOTE — Patient Instructions (Signed)
[facility] MEDICAL ONCOLOGY  Discharge Instructions: Thank you for choosing [facility] to provide your oncology and hematology care.   If you have a lab appointment with the Cancer Center, please go directly to the Cancer Center and check in at the registration area.   Wear comfortable clothing and clothing appropriate for easy access to any Portacath or PICC line.   We strive to give you quality time with your provider. You may need to reschedule your appointment if you arrive late (15 or more minutes).  Arriving late affects you and other patients whose appointments are after yours.  Also, if you miss three or more appointments without notifying the office, you may be dismissed from the clinic at the provider's discretion.      For prescription refill requests, have your pharmacy contact our office and allow 72 hours for refills to be completed.    Today you received the following chemotherapy and/or immunotherapy agents kyprolis      To help prevent nausea and vomiting after your treatment, we encourage you to take your nausea medication as directed.  BELOW ARE SYMPTOMS THAT SHOULD BE REPORTED IMMEDIATELY: . *FEVER GREATER THAN 100.4 F (38 C) OR HIGHER . *CHILLS OR SWEATING . *NAUSEA AND VOMITING THAT IS NOT CONTROLLED WITH YOUR NAUSEA MEDICATION . *UNUSUAL SHORTNESS OF BREATH . *UNUSUAL BRUISING OR BLEEDING . *URINARY PROBLEMS (pain or burning when urinating, or frequent urination) . *BOWEL PROBLEMS (unusual diarrhea, constipation, pain near the anus) . TENDERNESS IN MOUTH AND THROAT WITH OR WITHOUT PRESENCE OF ULCERS (sore throat, sores in mouth, or a toothache) . UNUSUAL RASH, SWELLING OR PAIN  . UNUSUAL VAGINAL DISCHARGE OR ITCHING   Items with * indicate a potential emergency and should be followed up as soon as possible or go to the Emergency Department if any problems should occur.  Please show the CHEMOTHERAPY ALERT CARD or IMMUNOTHERAPY ALERT  CARD at check-in to the Emergency Department and triage nurse.  Should you have questions after your visit or need to cancel or reschedule your appointment, please contact [facility] MEDICAL ONCOLOGY  Dept: [phone]  and follow the prompts.  Office hours are 8:00 a.m. to 4:30 p.m. Monday - Friday. Please note that voicemails left after 4:00 p.m. may not be returned until the following business day.  We are closed weekends and major holidays. You have access to a nurse at all times for urgent questions. Please call the main number to the clinic Dept: [phone] and follow the prompts.   For any non-urgent questions, you may also contact your provider using MyChart. We now offer e-Visits for anyone 18 and older to request care online for non-urgent symptoms. For details visit [email or web address].   Also download the MyChart app! Go to the app store, search "MyChart", open the app, select [facility], and log in with your MyChart username and password.  Due to Covid, a mask is required upon entering the hospital/clinic. If you do not have a mask, one will be given to you upon arrival. For doctor visits, patients may have 1 support person aged 18 or older with them. For treatment visits, patients cannot have anyone with them due to current Covid guidelines and our immunocompromised population.   

## 2021-04-06 ENCOUNTER — Other Ambulatory Visit: Payer: Self-pay | Admitting: *Deleted

## 2021-04-06 DIAGNOSIS — C9010 Plasma cell leukemia not having achieved remission: Secondary | ICD-10-CM

## 2021-04-06 MED ORDER — LENALIDOMIDE 10 MG PO CAPS: 10.0000 mg | ORAL_CAPSULE | Freq: Every day | ORAL | 0 refills | Status: DC

## 2021-04-06 NOTE — Telephone Encounter (Signed)
Revlimid refilled per  OV note 03/04/21

## 2021-04-14 NOTE — Progress Notes (Signed)
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 His labs today 12/14/2017 show hgb 8.3, platelets 524k. He is doing well overall. He has staples in his right upper leg following a recent surgery and has not been in touch with the surgeons for a f/u of this. He is not receiving physical therapy and is not getting around at home.  He notes that he was not scheduled for a post hospitalization nephrology f/u and has no PCP.  On review of systems, pt reports intermittent fatigue, SOB, pain to the left leg, dizziness, dry mouth and denies fever, chills, night sweats, dysuria and any other accompanying symptoms.   INTERVAL HISTORY:   Andres Fernandez presents today for follow up and continue management of his myeloma/plasma cell leukemia. The patient's last visit with [name] was on 03/04/2021. The pt reports that he is doing well overall.  The pt was seen for a f/u at [facility] by Dr. Marcell Anger on 04/11. The pt noted that every looked stable at this visit with no new concerns. The pt continues to tolerate his treatment well. He notes a mild fever after he received his second booster shot on 04/19, but this has since subsided. The pt notes no new symptoms or concerns since the last visit. He has not been experiencing much fatigue outside of overworking. The pt notes his benign positional vertigo has since resolved.  Lab results today 04/15/2021 of CBC w/diff and CMP is as follows: all values are WNL except for RBC of 3.62, Hgb of 11.6, HCT of 34.2, Chloride of 112, Glucose of 105, BUN of 26, Creatinine of 1.65, Calcium of 8.8, AST of 12, GFR est of 46. 04/15/2021 Light Chains ratio WNL  On review of systems, pt reports abdominal pain to touch due to sore muscles and  denies change in bowel habits, acid reflux, leg swelling, fevers, chills, night sweats, sudden weight loss, and any other symptoms.  REVIEW OF SYSTEMS:   10 Point review of Systems was done is negative except as noted above.  MEDICAL HISTORY:  Past Medical History:  Diagnosis Date  . BPH (benign prostatic hyperplasia)   . Hepatitis C 11/27/2017  . Hypertension   . Plasma cell leukemia ([place]) 11/23/2017  . Tobacco abuse     SURGICAL HISTORY: Past Surgical History:  Procedure Laterality Date  . APPENDECTOMY    . FEMUR IM NAIL Right 11/20/2017   Procedure: RIGHT FEMORAL INTRAMEDULLARY (IM) NAIL;  Surgeon: Nicholes Stairs, MD;  Location: [place];  Service: Orthopedics;  Laterality: Right;    SOCIAL HISTORY: Social History   Socioeconomic History  . Marital status: Single    Spouse name: Not on file  . Number of children: Not on file  . Years of education: Not on file  . Highest education level: Not on file  Occupational History  . Not on file  Tobacco Use  . Smoking status: Current Every Day Smoker    Packs/day: 0.25    Types: Cigarettes    Last attempt to quit: 11/09/2017    Years since quitting: 3.4  . Smokeless tobacco: Never Used  . Tobacco comment: 1 cigarette/day as of 09/25/18  Vaping Use  . Vaping Use: Never used  Substance and Sexual Activity  . Alcohol  use: Yes  . Drug use: No  . Sexual activity: Not on file  Other Topics Concern  . Not on file  Social History Narrative  . Not on file   Social Determinants of Health   Financial Resource Strain: Not on file  Food Insecurity: Not on file  Transportation Needs: Not on file  Physical Activity: Not on file  Stress: Not on file  Social Connections: Not on file  Intimate Partner Violence: Not on file    FAMILY HISTORY: Family History  Problem Relation Age of Onset  . COPD Mother   . Liver cancer Mother     ALLERGIES:  has No Known Allergies.  MEDICATIONS:  . Current Outpatient Medications  on File Prior to Visit  Medication Sig Dispense Refill  . aspirin EC 81 MG tablet Take 1 tablet (81 mg total) by mouth daily. (Patient taking differently: Take 81 mg by mouth at bedtime.) 60 tablet 11  . cholecalciferol (VITAMIN D) 400 units TABS tablet Take 400 Units by mouth at bedtime.     [name] lenalidomide (REVLIMID) 10 MG capsule Take 1 capsule (10 mg total) by mouth daily. Take for 21 days on, 7 days off, repeat every 28 days. 21 capsule 0  . Multiple Vitamins-Minerals (MULTIVITAMIN WITH MINERALS) tablet Take 1 tablet by mouth at bedtime.     . nicotine (NICODERM CQ - DOSED IN MG/24 HOURS) 14 mg/24hr patch PLACE 1 PATCH (14 MG TOTAL) ONTO THE SKIN DAILY. (Patient taking differently: Place 14 mg onto the skin daily.) 28 patch 1  . acyclovir (ZOVIRAX) 400 MG tablet Take 1 tablet (400 mg total) by mouth daily. (Patient not taking: Reported on 04/15/2021) 30 tablet 11  . Albuterol Sulfate (PROAIR RESPICLICK) 025 (90 Base) MCG/ACT AEPB Inhale into the lungs.    . ondansetron (ZOFRAN) 4 MG tablet Take 2 tablets (8 mg total) by mouth every 8 (eight) hours as needed for nausea. (Patient not taking: Reported on 04/15/2021) 30 tablet 0  . tiZANidine (ZANAFLEX) 2 MG tablet Take 1 tablet (2 mg total) by mouth every 8 (eight) hours as needed for muscle spasms. (Patient not taking: Reported on 04/15/2021) 30 tablet 0  . Vitamin D, Ergocalciferol, (DRISDOL) 1.25 MG (50000 UNIT) CAPS capsule Take 1 capsule (50,000 Units total) by mouth once a week. (Patient not taking: Reported on 04/15/2021) 12 capsule 6   No current facility-administered medications on file prior to visit.    PHYSICAL EXAMINATION:  ECOG FS:1 - Symptomatic but completely ambulatory  Vitals:   04/15/21 0948  BP: 106/80  Pulse: 73  Resp: 18  Temp: 97.8 F (36.6 C)   Wt Readings from Last 3 Encounters:  04/15/21 158 lb 3.2 oz (71.8 kg)  03/18/21 161 lb 8 oz (73.3 kg)  03/04/21 160 lb 4.8 oz (72.7 kg)   Body mass index is 24.05 kg/m.     GENERAL:alert, in no acute distress and comfortable SKIN: no acute rashes, no significant lesions EYES: conjunctiva are pink and non-injected, sclera anicteric OROPHARYNX: MMM, no exudates, no oropharyngeal erythema or ulceration NECK: supple, no JVD LYMPH:  no palpable lymphadenopathy in the cervical, axillary or inguinal regions LUNGS: clear to auscultation b/l with normal respiratory effort HEART: regular rate & rhythm ABDOMEN:  normoactive bowel sounds , non tender, not distended. Extremity: no pedal edema PSYCH: alert & oriented x 3 with fluent speech NEURO: no focal motor/sensory deficits  LABORATORY DATA:  I have reviewed the data as listed  CBC Latest Ref Rng &  Units 04/15/2021 04/01/2021 03/18/2021  WBC 4.0 - 10.5 K/uL 5.5 4.2 6.1  Hemoglobin 13.0 - 17.0 g/dL 11.6(L) 12.0(L) 12.5(L)  Hematocrit 39.0 - 52.0 % 34.2(L) 35.4(L) 35.7(L)  Platelets 150 - 400 K/uL 226 145(L) 186   CBC    Component Value Date/Time   WBC 5.5 04/15/2021 0901   RBC 3.62 (L) 04/15/2021 0901   HGB 11.6 (L) 04/15/2021 0901   HGB 11.9 (L) 08/27/2019 0922   HGB 8.3 (L) 12/14/2017 1230   HCT 34.2 (L) 04/15/2021 0901   HCT 23.9 (L) 12/14/2017 1230   PLT 226 04/15/2021 0901   PLT 203 08/27/2019 0922   PLT 524 (H) 12/14/2017 1230   MCV 94.5 04/15/2021 0901   MCV 88.4 12/14/2017 1230   MCH 32.0 04/15/2021 0901   MCHC 33.9 04/15/2021 0901   RDW 15.0 04/15/2021 0901   RDW 15.3 (H) 12/14/2017 1230   LYMPHSABS 1.5 04/15/2021 0901   LYMPHSABS 1.4 12/14/2017 1230   MONOABS 0.5 04/15/2021 0901   MONOABS 0.6 12/14/2017 1230   EOSABS 0.2 04/15/2021 0901   EOSABS 0.1 12/14/2017 1230   BASOSABS 0.1 04/15/2021 0901   BASOSABS 0.1 12/14/2017 1230     CMP Latest Ref Rng & Units 04/15/2021 04/01/2021 03/18/2021  Glucose 70 - 99 mg/dL 105(H) 84 109(H)  BUN 8 - 23 mg/dL 26(H) 20 31(H)  Creatinine 0.61 - 1.24 mg/dL 1.65(H) 1.62(H) 1.73(H)  Sodium 135 - 145 mmol/L 144 141 143  Potassium 3.5 - 5.1 mmol/L 4.0 3.8  4.0  Chloride 98 - 111 mmol/L 112(H) 112(H) 113(H)  CO2 22 - 32 mmol/L 22 20(L) 18(L)  Calcium 8.9 - 10.3 mg/dL 8.8(L) 8.5(L) 8.6(L)  Total Protein 6.5 - 8.1 g/dL 6.9 6.7 6.8  Total Bilirubin 0.3 - 1.2 mg/dL 0.4 0.5 0.6  Alkaline Phos 38 - 126 U/L 72 65 66  AST 15 - 41 U/L 12(L) 14(L) 12(L)  ALT 0 - 44 U/L 10 11 15    03/09/20 ([id number]) Bone Marrow Biopsy at [facility]   07/02/2019 Bone Marrow Biopsy ([id number]) at [facility]    08/07/18 BM Bx:       RADIOGRAPHIC STUDIES: I have personally reviewed the radiological images as listed and agreed with the findings in the report. No results found.  ASSESSMENT & PLAN:   65 y.o. 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   Initial BM Bx showed >95% involvement with kappa restricted serum free light chains. 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 MoL Cy-   08/07/18 BM Bx revealed normocellular bone marrow with trilineage hematopoiesis and 1% plasma cells which indicates the pt is in remission 08/06/18 PET/CT revealed  Expansile lytic lesion involving the left iliac bone, unchanged. Lytic lesion involving the sternum, unchanged. While both are suspicious for myeloma, neither lesion is FDG avid. No hypermetabolic osseous lesions in the visualized axial and appendicular skeleton. Please note that the bilateral lower extremities were not imaged. No suspicious lymphadenopathy. Spleen is normal in size.    Pt began Ninlaro 39m maintenance after pt finished 13 cycles of CyBorD and BM bx showed only 1% plasma cells, then resumed CyBorD and held Ninlaro in anticipation of his tandem bone marrow transplants beginning in late  December 2019 with  Dr. Blinda Leatherwood [facility]   11/19/18 High dose chemotherapy with Melphalan 130m/m2 and autologous stem cell rescue  03/20/19 High dose chemotherapy with Melphalan 2016mm2 and autologous stem cell rescue. Non-infectious diarrhea, uncomplicated course otherwise.  07/02/2019 PET scan with results revealing "1. No FDG avid bone lesions are identified. 2.  Asymmetric uptake within the right, greater than left, palatine tonsils with mild fullness of the right tonsillar soft tissue. ENT consult vs attention on follow up suggested. 3.  Innumerable mixed sclerotic and lytic lesions throughout the axial and appendicular skeleton without associated hypermetabolic activity. 4.  Ancillary CT findings as above."  07/02/2019 bone marrow biopsy ([id number])with results showing: "Trilineage hematopoieses with maturation. No plasma cell myeloma identified. Mild anemia and No Rouleaux or circulating plasma cells identified in the peripheral blood."  2) s/p Prophylactic IM nailing for rt femur  [facility]patient previously reported improvement and is currently progressively back to full time work.  3) Thrombocytopenia resolved   4) Renal insufficiency -- likely primarily related to Myeloma. stable Plan -maintain follow up with nephrology referral to Dr CoMarval RegalCNarda Amberidney for continued treatment -Pt completed 8 weeks of maverick for hep c with Dr CoLinus Salmonsn 07/08/18.  -Kidney numbers improved. Creatinine in the 1.7-2.2 range   5) h/o tobacco abuse-counseled on smoking cessation -Currently not smoking.  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 -Hgb improved/stable   -monitor  PLAN: -Discussed pt labwork today, 04/15/2021; counts and chemistries stable.  -[facility] pt [facility] -Discussed Evusheld and pt's  eligibility. Will send referral. Recommended pt wait one month from getting the second booster shot. -Advised pt to continue to monitor citrus intake and not bother his kidneys due to CKD. -Advised pt it would not be unreasonable to eat a salad as long as he makes it himself and washes it very well. -Recommended pt drink 48-64 oz water daily. -The pt has no prohibitive toxicities from continuing Carfilzomib every two weeks at this time. -The pt has no prohibitive toxicities from continuing 10 mg Revlimid 3 weeks on, 1 week off at this time. -Continue daily ASA and Vitamin D. -Continue Pamidronate q8weeks. -Will see back in 4 weeks with labs.   FOLLOW UP: Plz schedule next 6 doses of every 2 weekly carfilzomib with portflush and labs MD visit in 4 weeks on 05/13/2021 (okay to double book) Referral for Evusheld   The total time spent in the appointment was 20 minutes and more than 50% was on counseling and direct patient cares.  All of the patient's questions were answered with apparent satisfaction. The patient knows to call the clinic with any problems, questions or concerns.   GaSullivan LoneD MSMayAHIVMS [facility] Pcematology/Oncology Physician [facility](Office):       [phone] cell):  [phone]):           [phone], RoReinaldo Raddleam acting as scribe for Dr. GaSullivan LoneMD.    .I have reviewed the above documentation for accuracy and completeness, and I agree with the above. .GBrunetta GeneraD

## 2021-04-15 ENCOUNTER — Other Ambulatory Visit: Payer: Self-pay

## 2021-04-15 ENCOUNTER — Inpatient Hospital Stay: Payer: BC Managed Care – PPO | Attending: Hematology

## 2021-04-15 ENCOUNTER — Inpatient Hospital Stay: Payer: BC Managed Care – PPO

## 2021-04-15 ENCOUNTER — Inpatient Hospital Stay (HOSPITAL_BASED_OUTPATIENT_CLINIC_OR_DEPARTMENT_OTHER): Payer: BC Managed Care – PPO | Admitting: Hematology

## 2021-04-15 VITALS — BP 106/80 | HR 73 | Temp 97.8°F | Resp 18 | Ht 68.0 in | Wt 158.2 lb

## 2021-04-15 DIAGNOSIS — C9011 Plasma cell leukemia in remission: Secondary | ICD-10-CM

## 2021-04-15 DIAGNOSIS — Z7189 Other specified counseling: Secondary | ICD-10-CM

## 2021-04-15 DIAGNOSIS — F17210 Nicotine dependence, cigarettes, uncomplicated: Secondary | ICD-10-CM | POA: Diagnosis not present

## 2021-04-15 DIAGNOSIS — C9031 Solitary plasmacytoma in remission: Secondary | ICD-10-CM | POA: Insufficient documentation

## 2021-04-15 DIAGNOSIS — C9010 Plasma cell leukemia not having achieved remission: Secondary | ICD-10-CM

## 2021-04-15 DIAGNOSIS — Z7982 Long term (current) use of aspirin: Secondary | ICD-10-CM | POA: Insufficient documentation

## 2021-04-15 DIAGNOSIS — Z5111 Encounter for antineoplastic chemotherapy: Secondary | ICD-10-CM

## 2021-04-15 DIAGNOSIS — Z79899 Other long term (current) drug therapy: Secondary | ICD-10-CM | POA: Insufficient documentation

## 2021-04-15 DIAGNOSIS — Z5112 Encounter for antineoplastic immunotherapy: Secondary | ICD-10-CM | POA: Diagnosis present

## 2021-04-15 DIAGNOSIS — I10 Essential (primary) hypertension: Secondary | ICD-10-CM | POA: Insufficient documentation

## 2021-04-15 LAB — CBC WITH DIFFERENTIAL/PLATELET
Abs Immature Granulocytes: 0.04 10*3/uL (ref 0.00–0.07)
Basophils Absolute: 0.1 10*3/uL (ref 0.0–0.1)
Basophils Relative: 2 %
Eosinophils Absolute: 0.2 10*3/uL (ref 0.0–0.5)
Eosinophils Relative: 3 %
HCT: 34.2 % — ABNORMAL LOW (ref 39.0–52.0)
Hemoglobin: 11.6 g/dL — ABNORMAL LOW (ref 13.0–17.0)
Immature Granulocytes: 1 %
Lymphocytes Relative: 28 %
Lymphs Abs: 1.5 10*3/uL (ref 0.7–4.0)
MCH: 32 pg (ref 26.0–34.0)
MCHC: 33.9 g/dL (ref 30.0–36.0)
MCV: 94.5 fL (ref 80.0–100.0)
Monocytes Absolute: 0.5 10*3/uL (ref 0.1–1.0)
Monocytes Relative: 9 %
Neutro Abs: 3.1 10*3/uL (ref 1.7–7.7)
Neutrophils Relative %: 57 %
Platelets: 226 10*3/uL (ref 150–400)
RBC: 3.62 MIL/uL — ABNORMAL LOW (ref 4.22–5.81)
RDW: 15 % (ref 11.5–15.5)
WBC: 5.5 10*3/uL (ref 4.0–10.5)
nRBC: 0 % (ref 0.0–0.2)

## 2021-04-15 LAB — CMP (CANCER CENTER ONLY)
ALT: 10 U/L (ref 0–44)
AST: 12 U/L — ABNORMAL LOW (ref 15–41)
Albumin: 3.6 g/dL (ref 3.5–5.0)
Alkaline Phosphatase: 72 U/L (ref 38–126)
Anion gap: 10 (ref 5–15)
BUN: 26 mg/dL — ABNORMAL HIGH (ref 8–23)
CO2: 22 mmol/L (ref 22–32)
Calcium: 8.8 mg/dL — ABNORMAL LOW (ref 8.9–10.3)
Chloride: 112 mmol/L — ABNORMAL HIGH (ref 98–111)
Creatinine: 1.65 mg/dL — ABNORMAL HIGH (ref 0.61–1.24)
GFR, Estimated: 46 mL/min — ABNORMAL LOW (ref >60)
Glucose, Bld: 105 mg/dL — ABNORMAL HIGH (ref 70–99)
Potassium: 4 mmol/L (ref 3.5–5.1)
Sodium: 144 mmol/L (ref 135–145)
Total Bilirubin: 0.4 mg/dL (ref 0.3–1.2)
Total Protein: 6.9 g/dL (ref 6.5–8.1)

## 2021-04-15 MED ORDER — FAMOTIDINE 20 MG PO TABS
20.0000 mg | ORAL_TABLET | Freq: Once | ORAL | Status: AC
  Administered 2021-04-15: 20 mg via ORAL

## 2021-04-15 MED ORDER — SODIUM CHLORIDE 0.9 % IV SOLN
20.0000 mg | Freq: Once | INTRAVENOUS | Status: AC
  Administered 2021-04-15: 20 mg via INTRAVENOUS
  Filled 2021-04-15: qty 20

## 2021-04-15 MED ORDER — SODIUM CHLORIDE 0.9 % IV SOLN
INTRAVENOUS | Status: AC
  Filled 2021-04-15: qty 250

## 2021-04-15 MED ORDER — ACETAMINOPHEN 325 MG PO TABS
650.0000 mg | ORAL_TABLET | Freq: Once | ORAL | Status: AC
  Administered 2021-04-15: 650 mg via ORAL

## 2021-04-15 MED ORDER — SODIUM CHLORIDE 0.9 % IV SOLN
Freq: Once | INTRAVENOUS | Status: AC
  Filled 2021-04-15: qty 250

## 2021-04-15 MED ORDER — FAMOTIDINE 20 MG PO TABS
ORAL_TABLET | ORAL | Status: AC
  Filled 2021-04-15: qty 1

## 2021-04-15 MED ORDER — DEXTROSE 5 % IV SOLN
56.0000 mg/m2 | Freq: Once | INTRAVENOUS | Status: AC
  Administered 2021-04-15: 100 mg via INTRAVENOUS
  Filled 2021-04-15: qty 5

## 2021-04-15 MED ORDER — ACETAMINOPHEN 325 MG PO TABS
ORAL_TABLET | ORAL | Status: AC
  Filled 2021-04-15: qty 2

## 2021-04-15 MED ORDER — SODIUM CHLORIDE 0.9 % IV SOLN
Freq: Once | INTRAVENOUS | Status: AC
Start: 2021-04-15 — End: 2021-04-15
  Filled 2021-04-15: qty 250

## 2021-04-15 NOTE — Progress Notes (Signed)
Per Dr. Kale OK to treat with today's labs  

## 2021-04-15 NOTE — Patient Instructions (Signed)
[facility] MEDICAL [facility]COLOGY  Discharge Instructions: Thank you for choosing [facility] to provide your oncology and hematology care.   If you have a lab appointment with the Cancer Center, please go directly to the Cancer Center and check in at the registration area.   Wear comfortable clothing and clothing appropriate for easy access to any Portacath or PICC line.   We strive to give you quality time with your provider. You may need to reschedule your appointment if you arrive late (15 or more minutes).  Arriving late affects you and other patients whose appointments are after yours.  Also, if you miss three or more appointments without notifying the office, you may be dismissed from the clinic at the provider's discretion.      For prescription refill requests, have your pharmacy contact our office and allow 72 hours for refills to be completed.    Today you received the following chemotherapy and/or immunotherapy agents kyprolis      To help prevent nausea and vomiting after your treatment, we encourage you to take your nausea medication as directed.  BELOW ARE SYMPTOMS THAT SHOULD BE REPORTED IMMEDIATELY: . *FEVER GREATER THAN 100.4 F (38 C) OR HIGHER . *CHILLS OR SWEATING . *NAUSEA AND VOMITING THAT IS NOT CONTROLLED WITH YOUR NAUSEA MEDICATION . *UNUSUAL SHORTNESS OF BREATH . *UNUSUAL BRUISING OR BLEEDING . *URINARY PROBLEMS (pain or burning when urinating, or frequent urination) . *BOWEL PROBLEMS (unusual diarrhea, constipation, pain near the anus) . TENDERNESS IN MOUTH AND THROAT WITH OR WITHOUT PRESENCE OF ULCERS (sore throat, sores in mouth, or a toothache) . UNUSUAL RASH, SWELLING OR PAIN  . UNUSUAL VAGINAL DISCHARGE OR ITCHING   Items with * indicate a potential emergency and should be followed up as soon as possible or go to the Emergency Department if any problems should occur.  Please show the CHEMOTHERAPY ALERT CARD or IMMUNOTHERAPY ALERT  CARD at check-in to the Emergency Department and triage nurse.  Should you have questions after your visit or need to cancel or reschedule your appointment, please contact [facility] MEDICAL ONCOL[facility]  Dept: [phone]  and follow the prompts.  Office hours are 8:00 a.m. to 4:30 p.m. Monday - Friday. Please note that voicemails left after 4:00 p.m. may not be returned until the following business day.  We are closed weekends and major holidays. You have access to a nurse at all times for urgent questions. Please call the main number to the clinic Dept: [phone] and follow the prompts.   For any non-urgent questions, you may also contact your provider using MyChart. We now offer e-Visits for anyone 18 and older to request care online for non-urgent symptoms. For details visit [email or web address].   Also download the MyChart app! Go to the app store, search "MyChart", open the app, select , and log in with your MyChart username and password.  Due to Covid, a mask is required upon entering the hospital/clinic. If you do not have a mask, one will be given to you upon arrival. For doctor visits, patients may have 1 support person aged 18 or older with them. For treatment visits, patients cannot have anyone with them due to current Covid guidelines and our immunocompromised population.   

## 2021-04-18 ENCOUNTER — Telehealth: Payer: Self-pay | Admitting: Adult Health

## 2021-04-18 LAB — KAPPA/LAMBDA LIGHT CHAINS
Kappa free light chain: 29.5 mg/L — ABNORMAL HIGH (ref 3.3–19.4)
Kappa, lambda light chain ratio: 1.11 (ref 0.26–1.65)
Lambda free light chains: 26.6 mg/L — ABNORMAL HIGH (ref 5.7–26.3)

## 2021-04-18 NOTE — Telephone Encounter (Signed)
I called patient to discuss Evusheld, a long acting monoclonal antibody injection administered to patients with decreased immune systems or intolerance/allergy to the COVID 19 vaccine as COVID19 prevention.    Unable to reach patient.  LMOM to return my call.  Brylynn Hanssen, NP  

## 2021-04-19 ENCOUNTER — Telehealth: Payer: Self-pay | Admitting: Hematology

## 2021-04-19 NOTE — Telephone Encounter (Signed)
Scheduled follow-up appointments per 5/6 los. Patient is aware. Mailed calendar.

## 2021-04-29 ENCOUNTER — Inpatient Hospital Stay: Payer: BC Managed Care – PPO

## 2021-04-29 ENCOUNTER — Other Ambulatory Visit: Payer: Self-pay

## 2021-04-29 VITALS — BP 130/66 | HR 97 | Temp 98.2°F | Resp 24 | Wt 155.8 lb

## 2021-04-29 DIAGNOSIS — Z5111 Encounter for antineoplastic chemotherapy: Secondary | ICD-10-CM

## 2021-04-29 DIAGNOSIS — C9011 Plasma cell leukemia in remission: Secondary | ICD-10-CM

## 2021-04-29 DIAGNOSIS — Z7189 Other specified counseling: Secondary | ICD-10-CM

## 2021-04-29 DIAGNOSIS — C9010 Plasma cell leukemia not having achieved remission: Secondary | ICD-10-CM

## 2021-04-29 DIAGNOSIS — C9031 Solitary plasmacytoma in remission: Secondary | ICD-10-CM | POA: Diagnosis not present

## 2021-04-29 LAB — CBC WITH DIFFERENTIAL/PLATELET
Abs Immature Granulocytes: 0.03 10*3/uL (ref 0.00–0.07)
Basophils Absolute: 0.1 10*3/uL (ref 0.0–0.1)
Basophils Relative: 1 %
Eosinophils Absolute: 0.4 10*3/uL (ref 0.0–0.5)
Eosinophils Relative: 5 %
HCT: 33.1 % — ABNORMAL LOW (ref 39.0–52.0)
Hemoglobin: 11.7 g/dL — ABNORMAL LOW (ref 13.0–17.0)
Immature Granulocytes: 1 %
Lymphocytes Relative: 31 %
Lymphs Abs: 2 10*3/uL (ref 0.7–4.0)
MCH: 33.1 pg (ref 26.0–34.0)
MCHC: 35.3 g/dL (ref 30.0–36.0)
MCV: 93.5 fL (ref 80.0–100.0)
Monocytes Absolute: 0.7 10*3/uL (ref 0.1–1.0)
Monocytes Relative: 11 %
Neutro Abs: 3.4 10*3/uL (ref 1.7–7.7)
Neutrophils Relative %: 51 %
Platelets: 223 10*3/uL (ref 150–400)
RBC: 3.54 MIL/uL — ABNORMAL LOW (ref 4.22–5.81)
RDW: 15 % (ref 11.5–15.5)
WBC: 6.7 10*3/uL (ref 4.0–10.5)
nRBC: 0 % (ref 0.0–0.2)

## 2021-04-29 LAB — CMP (CANCER CENTER ONLY)
ALT: 11 U/L (ref 0–44)
AST: 12 U/L — ABNORMAL LOW (ref 15–41)
Albumin: 3.8 g/dL (ref 3.5–5.0)
Alkaline Phosphatase: 72 U/L (ref 38–126)
Anion gap: 10 (ref 5–15)
BUN: 32 mg/dL — ABNORMAL HIGH (ref 8–23)
CO2: 20 mmol/L — ABNORMAL LOW (ref 22–32)
Calcium: 8.8 mg/dL — ABNORMAL LOW (ref 8.9–10.3)
Chloride: 111 mmol/L (ref 98–111)
Creatinine: 1.75 mg/dL — ABNORMAL HIGH (ref 0.61–1.24)
GFR, Estimated: 43 mL/min — ABNORMAL LOW (ref >60)
Glucose, Bld: 108 mg/dL — ABNORMAL HIGH (ref 70–99)
Potassium: 4.1 mmol/L (ref 3.5–5.1)
Sodium: 141 mmol/L (ref 135–145)
Total Bilirubin: 0.5 mg/dL (ref 0.3–1.2)
Total Protein: 7.1 g/dL (ref 6.5–8.1)

## 2021-04-29 MED ORDER — SODIUM CHLORIDE 0.9 % IV SOLN
Freq: Once | INTRAVENOUS | Status: AC
  Filled 2021-04-29: qty 250

## 2021-04-29 MED ORDER — FAMOTIDINE 20 MG PO TABS
20.0000 mg | ORAL_TABLET | Freq: Once | ORAL | Status: AC
  Administered 2021-04-29: 20 mg via ORAL

## 2021-04-29 MED ORDER — ACETAMINOPHEN 325 MG PO TABS
ORAL_TABLET | ORAL | Status: AC
  Filled 2021-04-29: qty 2

## 2021-04-29 MED ORDER — ACETAMINOPHEN 325 MG PO TABS
650.0000 mg | ORAL_TABLET | Freq: Once | ORAL | Status: AC
  Administered 2021-04-29: 650 mg via ORAL

## 2021-04-29 MED ORDER — SODIUM CHLORIDE 0.9 % IV SOLN
INTRAVENOUS | Status: AC
  Filled 2021-04-29: qty 250

## 2021-04-29 MED ORDER — FAMOTIDINE 20 MG PO TABS
ORAL_TABLET | ORAL | Status: AC
  Filled 2021-04-29: qty 1

## 2021-04-29 MED ORDER — FAMOTIDINE 20 MG IN NS 100 ML IVPB
INTRAVENOUS | Status: AC
  Filled 2021-04-29: qty 100

## 2021-04-29 MED ORDER — SODIUM CHLORIDE 0.9 % IV SOLN
20.0000 mg | Freq: Once | INTRAVENOUS | Status: AC
  Administered 2021-04-29: 20 mg via INTRAVENOUS
  Filled 2021-04-29: qty 20

## 2021-04-29 MED ORDER — DEXTROSE 5 % IV SOLN
56.0000 mg/m2 | Freq: Once | INTRAVENOUS | Status: AC
  Administered 2021-04-29: 100 mg via INTRAVENOUS
  Filled 2021-04-29: qty 30

## 2021-04-29 NOTE — Patient Instructions (Signed)
[facility]COL[facility]  Discharge Instructions: Thank you for choosing [facility] to provide your oncology and hematology care.   If you have a lab appointment with the Cancer Center, please go directly to the Cancer Center and check in at the registration area.   Wear comfortable clothing and clothing appropriate for easy access to any Portacath or PICC line.   We strive to give you quality time with your provider. You may need to reschedule your appointment if you arrive late (15 or more minutes).  Arriving late affects you and other patients whose appointments are after yours.  Also, if you miss three or more appointments without notifying the office, you may be dismissed from the clinic at the provider's discretion.      For prescription refill requests, have your pharmacy contact our office and allow 72 hours for refills to be completed.    Today you received the following chemotherapy and/or immunotherapy agent: Carfilzomib (Kyprolis).    To help prevent nausea and vomiting after your treatment, we encourage you to take your nausea medication as directed.  BELOW ARE SYMPTOMS THAT SHOULD BE REPORTED IMMEDIATELY: *FEVER GREATER THAN 100.4 F (38 C) OR HIGHER *CHILLS OR SWEATING *NAUSEA AND VOMITING THAT IS NOT CONTROLLED WITH YOUR NAUSEA MEDICATION *UNUSUAL SHORTNESS OF BREATH *UNUSUAL BRUISING OR BLEEDING *URINARY PROBLEMS (pain or burning when urinating, or frequent urination) *BOWEL PROBLEMS (unusual diarrhea, constipation, pain near the anus) TENDERNESS IN MOUTH AND THROAT WITH OR WITHOUT PRESENCE OF ULCERS (sore throat, sores in mouth, or a toothache) UNUSUAL RASH, SWELLING OR PAIN  UNUSUAL VAGINAL DISCHARGE OR ITCHING   Items with * indicate a potential emergency and should be followed up as soon as possible or go to the Emergency Department if any problems should occur.  Please show the CHEMOTHERAPY ALERT CARD or IMMUNOTHERAPY ALERT CARD at  check-in to the Emergency Department and triage nurse.  Should you have questions after your visit or need to cancel or reschedule your appointment, please contact [facility]  Dept: [phone]  and follow the prompts.  Office hours are 8:00 a.m. to 4:30 p.m. Monday - Friday. Please note that voicemails left after 4:00 p.m. may not be returned until the following business day.  We are closed weekends and major holidays. You have access to a nurse at all times for urgent questions. Please call the main number to the clinic Dept: [phone] and follow the prompts.   For any non-urgent questions, you may also contact your provider using MyChart. We now offer e-Visits for anyone 18 and older to request care online for non-urgent symptoms. For details visit [email or web address].   Also download the MyChart app! Go to the app store, search "MyChart", open the app, select [facility], and log in with your MyChart username and password.  Due to Covid, a mask is required upon entering the hospital/clinic. If you do not have a mask, one will be given to you upon arrival. For doctor visits, patients may have 1 support person aged 18 or older with them. For treatment visits, patients cannot have anyone with them due to current Covid guidelines and our immunocompromised population.   

## 2021-05-06 ENCOUNTER — Other Ambulatory Visit: Payer: Self-pay

## 2021-05-06 DIAGNOSIS — C9010 Plasma cell leukemia not having achieved remission: Secondary | ICD-10-CM

## 2021-05-06 MED ORDER — LENALIDOMIDE 10 MG PO CAPS: 10.0000 mg | ORAL_CAPSULE | Freq: Every day | ORAL | 0 refills | Status: DC

## 2021-05-12 NOTE — Progress Notes (Signed)
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 His labs today 12/14/2017 show hgb 8.3, platelets 524k. He is doing well overall. He has staples in his right upper leg following a recent surgery and has not been in touch with the surgeons for a f/u of this. He is not receiving physical therapy and is not getting around at home.  He notes that he was not scheduled for a post hospitalization nephrology f/u and has no PCP.  On review of systems, pt reports intermittent fatigue, SOB, pain to the left leg, dizziness, dry mouth and denies fever, chills, night sweats, dysuria and any other accompanying symptoms.   INTERVAL HISTORY:   Andres Fernandez presents today for follow up and continue management of his myeloma/plasma cell leukemia. The patient's last visit with [name] was on 04/15/2021. The pt reports that he is doing well overall.  The pt reports no new symptoms or concerns. He notes no issues tolerating the continued Revlimid and Carfilzomib. He notes the Revlimid he recently received is now a different bottle and pill.   Lab results today 05/13/2021 of CBC w/diff and CMP is as follows: all values are WNL except for RBC of 3.67, Hgb of 12.0, HCT of 34.8, Basophils Abs of 0.2K, Chloride of 114, CO2 of 16, Glucose of 117, BUN of 30, Creatinine of 1.71, AST of 12, GFR est of 44. 05/13/2021 Light Chains K/L ratio wnl  On review of systems, pt reports decreased appetite due to heat and denies infection issues, fevers, chills,  and any other symptoms.  REVIEW OF SYSTEMS:   10 Point review of Systems was done is negative except as noted above.  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   Tobacco abuse     SURGICAL HISTORY: Past Surgical History:  Procedure Laterality Date   APPENDECTOMY     FEMUR IM NAIL Right 11/20/2017   Procedure: RIGHT FEMORAL INTRAMEDULLARY (IM) NAIL;  Surgeon: Nicholes Stairs, MD;  Location: [facility];  Service: Orthopedics;  Laterality: Right;    SOCIAL HISTORY: Social History   Socioeconomic History   Marital status: Single    Spouse name: Not on file   Number of children: Not on file   Years of education: Not on file   Highest education level: Not on file  Occupational History   Not on file  Tobacco Use   Smoking status: Current Every Day Smoker    Packs/day: 0.25    Types: Cigarettes    Last attempt to quit: 11/09/2017    Years since quitting: 3.5   Smokeless tobacco: Never Used   Tobacco comment: 1 cigarette/day as of 09/25/18  Vaping Use   Vaping Use: Never used  Substance and Sexual Activity   Alcohol use: Yes   Drug use: No   Sexual activity: Not on file  Other Topics Concern   Not on file  Social History Narrative   Not on file   Social Determinants of Health   Financial Resource Strain: Not on file  Food Insecurity: Not on file  Transportation Needs: Not on file  Physical Activity: Not on  file  Stress: Not on file  Social Connections: Not on file  Intimate Partner Violence: Not on file    FAMILY HISTORY: Family History  Problem Relation Age of Onset   COPD Mother    Liver cancer Mother     ALLERGIES:  has No Known Allergies.  MEDICATIONS:  . Current Outpatient Medications on File Prior to Visit  Medication Sig Dispense Refill   acyclovir (ZOVIRAX) 400 MG tablet Take 1 tablet (400 mg total) by mouth daily. (Patient not taking: Reported on 04/15/2021) 30 tablet 11   Albuterol Sulfate (PROAIR RESPICLICK) 622 (90 Base) MCG/ACT AEPB Inhale into the lungs.     aspirin EC 81 MG tablet Take 1 tablet (81 mg total) by mouth daily. (Patient  taking differently: Take 81 mg by mouth at bedtime.) 60 tablet 11   cholecalciferol (VITAMIN D) 400 units TABS tablet Take 400 Units by mouth at bedtime.      lenalidomide (REVLIMID) 10 MG capsule Take 1 capsule (10 mg total) by mouth daily. Take for 21 days on, 7 days off, repeat every 28 days. 21 capsule 0   Multiple Vitamins-Minerals (MULTIVITAMIN WITH MINERALS) tablet Take 1 tablet by mouth at bedtime.      nicotine (NICODERM CQ - DOSED IN MG/24 HOURS) 14 mg/24hr patch PLACE 1 PATCH (14 MG TOTAL) ONTO THE SKIN DAILY. (Patient taking differently: Place 14 mg onto the skin daily.) 28 patch 1   ondansetron (ZOFRAN) 4 MG tablet Take 2 tablets (8 mg total) by mouth every 8 (eight) hours as needed for nausea. (Patient not taking: Reported on 04/15/2021) 30 tablet 0   tiZANidine (ZANAFLEX) 2 MG tablet Take 1 tablet (2 mg total) by mouth every 8 (eight) hours as needed for muscle spasms. (Patient not taking: Reported on 04/15/2021) 30 tablet 0   Vitamin D, Ergocalciferol, (DRISDOL) 1.25 MG (50000 UNIT) CAPS capsule Take 1 capsule (50,000 Units total) by mouth once a week. (Patient not taking: Reported on 04/15/2021) 12 capsule 6   No current facility-administered medications on file prior to visit.    PHYSICAL EXAMINATION:  ECOG FS:1 - Symptomatic but completely ambulatory  Vitals:   05/13/21 1027  BP: 124/85  Pulse: 73  Resp: 17  Temp: 98.3 F (36.8 C)  SpO2: 100%   Wt Readings from Last 3 Encounters:  05/13/21 153 lb 14.4 oz (69.8 kg)  04/29/21 155 lb 12 oz (70.6 kg)  04/15/21 158 lb 3.2 oz (71.8 kg)   Body mass index is 23.4 kg/m.    NAD. GENERAL:alert, in no acute distress and comfortable SKIN: no acute rashes, no significant lesions EYES: conjunctiva are pink and non-injected, sclera anicteric OROPHARYNX: MMM, no exudates, no oropharyngeal erythema or ulceration NECK: supple, no JVD LYMPH:  no palpable lymphadenopathy in the cervical, axillary or inguinal regions LUNGS: clear to  auscultation b/l with normal respiratory effort HEART: regular rate & rhythm ABDOMEN:  normoactive bowel sounds , non tender, not distended. Extremity: no pedal edema PSYCH: alert & oriented x 3 with fluent speech NEURO: no focal motor/sensory deficits  LABORATORY DATA:  I have reviewed the data as listed  CBC Latest Ref Rng & Units 05/13/2021 04/29/2021 04/15/2021  WBC 4.0 - 10.5 K/uL 6.5 6.7 5.5  Hemoglobin 13.0 - 17.0 g/dL 12.0(L) 11.7(L) 11.6(L)  Hematocrit 39.0 - 52.0 % 34.8(L) 33.1(L) 34.2(L)  Platelets 150 - 400 K/uL 200 223 226   CBC    Component Value Date/Time   WBC 6.5 05/13/2021 0904   RBC 3.67 (  L) 05/13/2021 0904   HGB 12.0 (L) 05/13/2021 0904   HGB 11.9 (L) 08/27/2019 0922   HGB 8.3 (L) 12/14/2017 1230   HCT 34.8 (L) 05/13/2021 0904   HCT 23.9 (L) 12/14/2017 1230   PLT 200 05/13/2021 0904   PLT 203 08/27/2019 0922   PLT 524 (H) 12/14/2017 1230   MCV 94.8 05/13/2021 0904   MCV 88.4 12/14/2017 1230   MCH 32.7 05/13/2021 0904   MCHC 34.5 05/13/2021 0904   RDW 15.5 05/13/2021 0904   RDW 15.3 (H) 12/14/2017 1230   LYMPHSABS 1.7 05/13/2021 0904   LYMPHSABS 1.4 12/14/2017 1230   MONOABS 0.7 05/13/2021 0904   MONOABS 0.6 12/14/2017 1230   EOSABS 0.1 05/13/2021 0904   EOSABS 0.1 12/14/2017 1230   BASOSABS 0.2 (H) 05/13/2021 0904   BASOSABS 0.1 12/14/2017 1230     CMP Latest Ref Rng & Units 05/13/2021 04/29/2021 04/15/2021  Glucose 70 - 99 mg/dL 117(H) 108(H) 105(H)  BUN 8 - 23 mg/dL 30(H) 32(H) 26(H)  Creatinine 0.61 - 1.24 mg/dL 1.71(H) 1.75(H) 1.65(H)  Sodium 135 - 145 mmol/L 141 141 144  Potassium 3.5 - 5.1 mmol/L 4.0 4.1 4.0  Chloride 98 - 111 mmol/L 114(H) 111 112(H)  CO2 22 - 32 mmol/L 16(L) 20(L) 22  Calcium 8.9 - 10.3 mg/dL 9.2 8.8(L) 8.8(L)  Total Protein 6.5 - 8.1 g/dL 7.1 7.1 6.9  Total Bilirubin 0.3 - 1.2 mg/dL 0.5 0.5 0.4  Alkaline Phos 38 - 126 U/L 63 72 72  AST 15 - 41 U/L 12(L) 12(L) 12(L)  ALT 0 - 44 U/L 10 11 10     03/09/20 ([id number]) Bone  Marrow Biopsy at [facility]   07/02/2019 Bone Marrow Biopsy ([id number]) at [facility]    08/07/18 BM Bx:        RADIOGRAPHIC STUDIES: I have personally reviewed the radiological images as listed and agreed with the findings in the report. No results found.  ASSESSMENT & PLAN:    65 y.o. 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   Initial BM Bx showed >95% involvement with kappa restricted serum free light chains. 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 MoL Cy-   08/07/18 BM Bx revealed normocellular bone marrow with trilineage hematopoiesis and 1% plasma cells which indicates the pt is in remission 08/06/18 PET/CT revealed  Expansile lytic lesion involving the left iliac bone, unchanged. Lytic lesion involving the sternum, unchanged. While both are suspicious for myeloma, neither lesion is FDG avid. No hypermetabolic osseous lesions in the visualized axial and appendicular skeleton. Please note that the bilateral lower extremities were not imaged. No suspicious lymphadenopathy. Spleen is normal in size.    Pt began Ninlaro 31m maintenance after pt finished 13 cycles of CyBorD and BM bx showed only 1% plasma cells, then resumed CyBorD and held Ninlaro in anticipation of his tandem bone marrow transplants [facility] in late December 2019 [facility]  11/19/18 High dose chemotherapy with Melphalan 1467mm2 and autologous stem cell rescue  03/20/19 High dose chemotherapy with Melphalan 20078m2 and autologous stem cell rescue. Non-infectious diarrhea, uncomplicated course otherwise.  07/02/2019 PET scan with results revealing "1. No FDG avid bone  lesions are identified. 2.  Asymmetric uptake within the right, greater than left, palatine tonsils with mild fullness of the right tonsillar soft tissue. ENT consult vs attention on follow up suggested. 3.  Innumerable mixed sclerotic and lytic lesions throughout the axial and appendicular skeleton without associated hypermetabolic activity. 4.  Ancillary CT findings as above."  07/02/2019 bone marrow biopsy ([id number])with results showing: "Trilineage hematopoieses with maturation. No plasma cell myeloma identified. Mild anemia and No Rouleaux or circulating plasma cells identified in the peripheral blood."  2) s/p Prophylactic IM nailing for rt femur  completed [facility].  -patient previously reported improvement and is currently progressively back to full time work.  3) Thrombocytopenia resolved   4) Renal insufficiency -- likely primarily related to Myeloma. stable Plan -maintain follow up with nephrology referral to Dr Marval [facility]al [facility] for continued treatment -Pt completed 8 weeks of maverick for hep c with Dr Linus Salmons on 07/08/18.  -Kidney numbers improved. Creatinine in the 1.7-2.2 range   5) h/o tobacco abuse-counseled on smoking cessation -Currently not smoking.   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 -Hgb improved/stable   -monitor  PLAN: -Discussed pt labwork today, 05/13/2021; blood counts normal, chemistries stable.  -Advised pt his last light chains sent out were normal. Sent out again today. -Discussed Evusheld and pt's eligibility. Will send referral again for them to call the pt. He prefers after 4PM due to work. -The pt has already received his second COVID booster. -Recommended pt continue to wear a mask and be cautious of social surroundings. -Recommended pt drink 48-64 oz water daily. -The pt has no prohibitive toxicities from continuing  Carfilzomib every two weeks at this time. -The pt has no prohibitive toxicities from continuing 10 mg Revlimid 3 weeks on, 1 week off at this time. -Continue daily ASA and Vitamin D. -Continue Pamidronate q8weeks. -Will see back in 6 weeks with labs.   FOLLOW UP: Plz schedule next 6 doses of every 2 weekly carfilzomib with portflush and labs MD visit in 6 weeks  Referral for Evusheld -Continue Pamidronate q8weeks- plz schedule next 3 doses     The total time spent in the appointment was 30 minutes and more than 50% was on counseling and direct patient cares, ordering and mx of chemotherapy  All of the patient's questions were answered with apparent satisfaction. The patient knows to call the clinic with any problems, questions or concerns.   Sullivan Lone MD [place] AAHIVMS [facility] Hematology/Oncology Physician [facility]  (Office):       [phone] (Work cell):  [phone] (Fax):           [phone]  I, Reinaldo Raddle, am acting as scribe for Dr. Sullivan Lone, MD. .I have reviewed the above documentation for accuracy and completeness, and I agree with the above. Brunetta Genera MD

## 2021-05-13 ENCOUNTER — Inpatient Hospital Stay (HOSPITAL_BASED_OUTPATIENT_CLINIC_OR_DEPARTMENT_OTHER): Payer: BC Managed Care – PPO | Admitting: Hematology

## 2021-05-13 ENCOUNTER — Inpatient Hospital Stay: Payer: BC Managed Care – PPO | Attending: Hematology

## 2021-05-13 ENCOUNTER — Other Ambulatory Visit: Payer: Self-pay

## 2021-05-13 ENCOUNTER — Inpatient Hospital Stay: Payer: BC Managed Care – PPO

## 2021-05-13 VITALS — BP 124/85 | HR 73 | Temp 98.3°F | Resp 17 | Wt 153.9 lb

## 2021-05-13 DIAGNOSIS — D649 Anemia, unspecified: Secondary | ICD-10-CM | POA: Insufficient documentation

## 2021-05-13 DIAGNOSIS — Z5112 Encounter for antineoplastic immunotherapy: Secondary | ICD-10-CM | POA: Diagnosis present

## 2021-05-13 DIAGNOSIS — N289 Disorder of kidney and ureter, unspecified: Secondary | ICD-10-CM | POA: Insufficient documentation

## 2021-05-13 DIAGNOSIS — C9031 Solitary plasmacytoma in remission: Secondary | ICD-10-CM | POA: Diagnosis not present

## 2021-05-13 DIAGNOSIS — C9010 Plasma cell leukemia not having achieved remission: Secondary | ICD-10-CM

## 2021-05-13 DIAGNOSIS — N400 Enlarged prostate without lower urinary tract symptoms: Secondary | ICD-10-CM | POA: Diagnosis not present

## 2021-05-13 DIAGNOSIS — I10 Essential (primary) hypertension: Secondary | ICD-10-CM | POA: Insufficient documentation

## 2021-05-13 DIAGNOSIS — Z79899 Other long term (current) drug therapy: Secondary | ICD-10-CM | POA: Diagnosis not present

## 2021-05-13 DIAGNOSIS — Z87891 Personal history of nicotine dependence: Secondary | ICD-10-CM | POA: Diagnosis not present

## 2021-05-13 DIAGNOSIS — Z5111 Encounter for antineoplastic chemotherapy: Secondary | ICD-10-CM

## 2021-05-13 DIAGNOSIS — C9011 Plasma cell leukemia in remission: Secondary | ICD-10-CM

## 2021-05-13 DIAGNOSIS — Z7982 Long term (current) use of aspirin: Secondary | ICD-10-CM | POA: Insufficient documentation

## 2021-05-13 DIAGNOSIS — Z7189 Other specified counseling: Secondary | ICD-10-CM

## 2021-05-13 LAB — CBC WITH DIFFERENTIAL/PLATELET
Abs Immature Granulocytes: 0.02 10*3/uL (ref 0.00–0.07)
Basophils Absolute: 0.2 10*3/uL — ABNORMAL HIGH (ref 0.0–0.1)
Basophils Relative: 3 %
Eosinophils Absolute: 0.1 10*3/uL (ref 0.0–0.5)
Eosinophils Relative: 2 %
HCT: 34.8 % — ABNORMAL LOW (ref 39.0–52.0)
Hemoglobin: 12 g/dL — ABNORMAL LOW (ref 13.0–17.0)
Immature Granulocytes: 0 %
Lymphocytes Relative: 27 %
Lymphs Abs: 1.7 10*3/uL (ref 0.7–4.0)
MCH: 32.7 pg (ref 26.0–34.0)
MCHC: 34.5 g/dL (ref 30.0–36.0)
MCV: 94.8 fL (ref 80.0–100.0)
Monocytes Absolute: 0.7 10*3/uL (ref 0.1–1.0)
Monocytes Relative: 11 %
Neutro Abs: 3.7 10*3/uL (ref 1.7–7.7)
Neutrophils Relative %: 57 %
Platelets: 200 10*3/uL (ref 150–400)
RBC: 3.67 MIL/uL — ABNORMAL LOW (ref 4.22–5.81)
RDW: 15.5 % (ref 11.5–15.5)
WBC: 6.5 10*3/uL (ref 4.0–10.5)
nRBC: 0 % (ref 0.0–0.2)

## 2021-05-13 LAB — CMP (CANCER CENTER ONLY)
ALT: 10 U/L (ref 0–44)
AST: 12 U/L — ABNORMAL LOW (ref 15–41)
Albumin: 3.9 g/dL (ref 3.5–5.0)
Alkaline Phosphatase: 63 U/L (ref 38–126)
Anion gap: 11 (ref 5–15)
BUN: 30 mg/dL — ABNORMAL HIGH (ref 8–23)
CO2: 16 mmol/L — ABNORMAL LOW (ref 22–32)
Calcium: 9.2 mg/dL (ref 8.9–10.3)
Chloride: 114 mmol/L — ABNORMAL HIGH (ref 98–111)
Creatinine: 1.71 mg/dL — ABNORMAL HIGH (ref 0.61–1.24)
GFR, Estimated: 44 mL/min — ABNORMAL LOW (ref >60)
Glucose, Bld: 117 mg/dL — ABNORMAL HIGH (ref 70–99)
Potassium: 4 mmol/L (ref 3.5–5.1)
Sodium: 141 mmol/L (ref 135–145)
Total Bilirubin: 0.5 mg/dL (ref 0.3–1.2)
Total Protein: 7.1 g/dL (ref 6.5–8.1)

## 2021-05-13 MED ORDER — SODIUM CHLORIDE 0.9 % IV SOLN
Freq: Once | INTRAVENOUS | Status: DC
  Filled 2021-05-13: qty 250

## 2021-05-13 MED ORDER — FAMOTIDINE 20 MG PO TABS
20.0000 mg | ORAL_TABLET | Freq: Once | ORAL | Status: AC
  Administered 2021-05-13: 20 mg via ORAL

## 2021-05-13 MED ORDER — SODIUM CHLORIDE 0.9 % IV SOLN
20.0000 mg | Freq: Once | INTRAVENOUS | Status: AC
  Administered 2021-05-13: 20 mg via INTRAVENOUS
  Filled 2021-05-13: qty 20

## 2021-05-13 MED ORDER — ACETAMINOPHEN 325 MG PO TABS
ORAL_TABLET | ORAL | Status: AC
  Filled 2021-05-13: qty 2

## 2021-05-13 MED ORDER — DEXTROSE 5 % IV SOLN
56.0000 mg/m2 | Freq: Once | INTRAVENOUS | Status: AC
  Administered 2021-05-13: 100 mg via INTRAVENOUS
  Filled 2021-05-13: qty 30

## 2021-05-13 MED ORDER — SODIUM CHLORIDE 0.9 % IV SOLN
Freq: Once | INTRAVENOUS | Status: AC
  Filled 2021-05-13: qty 250

## 2021-05-13 MED ORDER — ACETAMINOPHEN 325 MG PO TABS
650.0000 mg | ORAL_TABLET | Freq: Once | ORAL | Status: AC
  Administered 2021-05-13: 650 mg via ORAL

## 2021-05-13 MED ORDER — SODIUM CHLORIDE 0.9 % IV SOLN
INTRAVENOUS | Status: AC
  Filled 2021-05-13: qty 250

## 2021-05-13 MED ORDER — FAMOTIDINE 20 MG PO TABS
ORAL_TABLET | ORAL | Status: AC
  Filled 2021-05-13: qty 1

## 2021-05-13 NOTE — Progress Notes (Unsigned)
CR 1.71 ok for treatment today

## 2021-05-13 NOTE — Patient Instructions (Signed)
[facility]COLOG[facility]  Discharge Instructions: Thank you for choosing [facility] to provide your oncology and hematology care.   If you have a lab appointment with the Cancer Center, please go directly to the Cancer Center and check in at the registration area.   Wear comfortable clothing and clothing appropriate for easy access to any Portacath or PICC line.   We strive to give you quality time with your provider. You may need to reschedule your appointment if you arrive late (15 or more minutes).  Arriving late affects you and other patients whose appointments are after yours.  Also, if you miss three or more appointments without notifying the office, you may be dismissed from the clinic at the provider's discretion.      For prescription refill requests, have your pharmacy contact our office and allow 72 hours for refills to be completed.    Today you received the following chemotherapy and/or immunotherapy agent: Carfilzomib (Kyprolis).    To help prevent nausea and vomiting after your treatment, we encourage you to take your nausea medication as directed.  BELOW ARE SYMPTOMS THAT SHOULD BE REPORTED IMMEDIATELY: *FEVER GREATER THAN 100.4 F (38 C) OR HIGHER *CHILLS OR SWEATING *NAUSEA AND VOMITING THAT IS NOT CONTROLLED WITH YOUR NAUSEA MEDICATION *UNUSUAL SHORTNESS OF BREATH *UNUSUAL BRUISING OR BLEEDING *URINARY PROBLEMS (pain or burning when urinating, or frequent urination) *BOWEL PROBLEMS (unusual diarrhea, constipation, pain near the anus) TENDERNESS IN MOUTH AND THROAT WITH OR WITHOUT PRESENCE OF ULCERS (sore throat, sores in mouth, or a toothache) UNUSUAL RASH, SWELLING OR PAIN  UNUSUAL VAGINAL DISCHARGE OR ITCHING   Items with * indicate a potential emergency and should be followed up as soon as possible or go to the Emergency Department if any problems should occur.  Please show the CHEMOTHERAPY ALERT CARD or IMMUNOTHERAPY ALERT CARD at  check-in to the Emergency Department and triage nurse.  Should you have questions after your visit or need to cancel or reschedule your appointment, please contact [facility]  Dept: [phone]  and follow the prompts.  Office hours are 8:00 a.m. to 4:30 p.m. Monday - Friday. Please note that voicemails left after 4:00 p.m. may not be returned until the following business day.  We are closed weekends and major holidays. You have access to a nurse at all times for urgent questions. Please call the main number to the clinic Dept: [phone] and follow the prompts.   For any non-urgent questions, you may also contact your provider using MyChart. We now offer e-Visits for anyone 18 and older to request care online for non-urgent symptoms. For details visit [email or web address].   Also download the MyChart app! Go to the app store, search "MyChart", open the app, select [facility], and log in with your MyChart username and password.  Due to Covid, a mask is required upon entering the hospital/clinic. If you do not have a mask, one will be given to you upon arrival. For doctor visits, patients may have 1 support person aged 18 or older with them. For treatment visits, patients cannot have anyone with them due to current Covid guidelines and our immunocompromised population.   

## 2021-05-16 LAB — KAPPA/LAMBDA LIGHT CHAINS
Kappa free light chain: 21.6 mg/L — ABNORMAL HIGH (ref 3.3–19.4)
Kappa, lambda light chain ratio: 1.11 (ref 0.26–1.65)
Lambda free light chains: 19.4 mg/L (ref 5.7–26.3)

## 2021-05-17 ENCOUNTER — Telehealth: Payer: Self-pay | Admitting: Hematology

## 2021-05-17 NOTE — Telephone Encounter (Signed)
Left message with follow-up appointments per 6/3 los. Gave option to call back to reschedule if needed.

## 2021-05-19 ENCOUNTER — Encounter: Payer: Self-pay | Admitting: Hematology

## 2021-05-27 ENCOUNTER — Inpatient Hospital Stay: Payer: BC Managed Care – PPO

## 2021-05-27 ENCOUNTER — Other Ambulatory Visit: Payer: Self-pay

## 2021-05-27 VITALS — BP 135/70 | HR 78 | Temp 99.4°F | Resp 16

## 2021-05-27 DIAGNOSIS — Z7189 Other specified counseling: Secondary | ICD-10-CM

## 2021-05-27 DIAGNOSIS — Z5111 Encounter for antineoplastic chemotherapy: Secondary | ICD-10-CM

## 2021-05-27 DIAGNOSIS — C9031 Solitary plasmacytoma in remission: Secondary | ICD-10-CM | POA: Diagnosis not present

## 2021-05-27 DIAGNOSIS — C9011 Plasma cell leukemia in remission: Secondary | ICD-10-CM

## 2021-05-27 DIAGNOSIS — C9010 Plasma cell leukemia not having achieved remission: Secondary | ICD-10-CM

## 2021-05-27 LAB — CBC WITH DIFFERENTIAL/PLATELET
Abs Immature Granulocytes: 0.02 10*3/uL (ref 0.00–0.07)
Basophils Absolute: 0 10*3/uL (ref 0.0–0.1)
Basophils Relative: 1 %
Eosinophils Absolute: 0.3 10*3/uL (ref 0.0–0.5)
Eosinophils Relative: 5 %
HCT: 35.4 % — ABNORMAL LOW (ref 39.0–52.0)
Hemoglobin: 12.1 g/dL — ABNORMAL LOW (ref 13.0–17.0)
Immature Granulocytes: 0 %
Lymphocytes Relative: 23 %
Lymphs Abs: 1.3 10*3/uL (ref 0.7–4.0)
MCH: 32.8 pg (ref 26.0–34.0)
MCHC: 34.2 g/dL (ref 30.0–36.0)
MCV: 95.9 fL (ref 80.0–100.0)
Monocytes Absolute: 0.6 10*3/uL (ref 0.1–1.0)
Monocytes Relative: 11 %
Neutro Abs: 3.4 10*3/uL (ref 1.7–7.7)
Neutrophils Relative %: 60 %
Platelets: 130 10*3/uL — ABNORMAL LOW (ref 150–400)
RBC: 3.69 MIL/uL — ABNORMAL LOW (ref 4.22–5.81)
RDW: 15.5 % (ref 11.5–15.5)
WBC: 5.7 10*3/uL (ref 4.0–10.5)
nRBC: 0 % (ref 0.0–0.2)

## 2021-05-27 LAB — CMP (CANCER CENTER ONLY)
ALT: 15 U/L (ref 0–44)
AST: 17 U/L (ref 15–41)
Albumin: 3.9 g/dL (ref 3.5–5.0)
Alkaline Phosphatase: 59 U/L (ref 38–126)
Anion gap: 7 (ref 5–15)
BUN: 25 mg/dL — ABNORMAL HIGH (ref 8–23)
CO2: 24 mmol/L (ref 22–32)
Calcium: 8.6 mg/dL — ABNORMAL LOW (ref 8.9–10.3)
Chloride: 108 mmol/L (ref 98–111)
Creatinine: 1.86 mg/dL — ABNORMAL HIGH (ref 0.61–1.24)
GFR, Estimated: 40 mL/min — ABNORMAL LOW (ref >60)
Glucose, Bld: 74 mg/dL (ref 70–99)
Potassium: 3.7 mmol/L (ref 3.5–5.1)
Sodium: 139 mmol/L (ref 135–145)
Total Bilirubin: 0.4 mg/dL (ref 0.3–1.2)
Total Protein: 7 g/dL (ref 6.5–8.1)

## 2021-05-27 MED ORDER — SODIUM CHLORIDE 0.9 % IV SOLN
INTRAVENOUS | Status: AC
  Filled 2021-05-27 (×2): qty 250

## 2021-05-27 MED ORDER — SODIUM CHLORIDE 0.9 % IV SOLN
Freq: Once | INTRAVENOUS | Status: DC
  Filled 2021-05-27: qty 250

## 2021-05-27 MED ORDER — DEXTROSE 5 % IV SOLN
56.0000 mg/m2 | Freq: Once | INTRAVENOUS | Status: AC
  Administered 2021-05-27: 100 mg via INTRAVENOUS
  Filled 2021-05-27: qty 15

## 2021-05-27 MED ORDER — HEPARIN SOD (PORK) LOCK FLUSH 100 UNIT/ML IV SOLN
500.0000 [IU] | Freq: Once | INTRAVENOUS | Status: DC | PRN
  Filled 2021-05-27: qty 5

## 2021-05-27 MED ORDER — FAMOTIDINE 20 MG PO TABS
ORAL_TABLET | ORAL | Status: AC
  Filled 2021-05-27: qty 1

## 2021-05-27 MED ORDER — SODIUM CHLORIDE 0.9 % IV SOLN
20.0000 mg | Freq: Once | INTRAVENOUS | Status: AC
  Administered 2021-05-27: 20 mg via INTRAVENOUS
  Filled 2021-05-27: qty 20

## 2021-05-27 MED ORDER — FAMOTIDINE 20 MG PO TABS
20.0000 mg | ORAL_TABLET | Freq: Once | ORAL | Status: AC
Start: 2021-05-27 — End: 2021-05-27
  Administered 2021-05-27: 20 mg via ORAL

## 2021-05-27 MED ORDER — SODIUM CHLORIDE 0.9% FLUSH
10.0000 mL | INTRAVENOUS | Status: DC | PRN
Start: 2021-05-27 — End: 2021-05-27
  Filled 2021-05-27: qty 10

## 2021-05-27 MED ORDER — ACETAMINOPHEN 325 MG PO TABS
650.0000 mg | ORAL_TABLET | Freq: Once | ORAL | Status: AC
Start: 2021-05-27 — End: 2021-05-27
  Administered 2021-05-27: 650 mg via ORAL

## 2021-05-27 MED ORDER — FAMOTIDINE 20 MG IN NS 100 ML IVPB
INTRAVENOUS | Status: AC
  Filled 2021-05-27: qty 100

## 2021-05-27 MED ORDER — ACETAMINOPHEN 325 MG PO TABS
ORAL_TABLET | ORAL | Status: AC
  Filled 2021-05-27: qty 2

## 2021-05-27 NOTE — Progress Notes (Signed)
Okay to treat with creatinine of 1.86 today, per Dr. Lorenso Courier.

## 2021-05-27 NOTE — Patient Instructions (Signed)
[facility]COL[facility]  Discharge Instructions: Thank you for choosing [facility] to provide your oncology and hematology care.   If you have a lab appointment with the Cancer Center, please go directly to the Cancer Center and check in at the registration area.   Wear comfortable clothing and clothing appropriate for easy access to any Portacath or PICC line.   We strive to give you quality time with your provider. You may need to reschedule your appointment if you arrive late (15 or more minutes).  Arriving late affects you and other patients whose appointments are after yours.  Also, if you miss three or more appointments without notifying the office, you may be dismissed from the clinic at the provider's discretion.      For prescription refill requests, have your pharmacy contact our office and allow 72 hours for refills to be completed.    Today you received the following chemotherapy and/or immunotherapy agent: Carfilzomib (Kyprolis).    To help prevent nausea and vomiting after your treatment, we encourage you to take your nausea medication as directed.  BELOW ARE SYMPTOMS THAT SHOULD BE REPORTED IMMEDIATELY: *FEVER GREATER THAN 100.4 F (38 C) OR HIGHER *CHILLS OR SWEATING *NAUSEA AND VOMITING THAT IS NOT CONTROLLED WITH YOUR NAUSEA MEDICATION *UNUSUAL SHORTNESS OF BREATH *UNUSUAL BRUISING OR BLEEDING *URINARY PROBLEMS (pain or burning when urinating, or frequent urination) *BOWEL PROBLEMS (unusual diarrhea, constipation, pain near the anus) TENDERNESS IN MOUTH AND THROAT WITH OR WITHOUT PRESENCE OF ULCERS (sore throat, sores in mouth, or a toothache) UNUSUAL RASH, SWELLING OR PAIN  UNUSUAL VAGINAL DISCHARGE OR ITCHING   Items with * indicate a potential emergency and should be followed up as soon as possible or go to the Emergency Department if any problems should occur.  Please show the CHEMOTHERAPY ALERT CARD or IMMUNOTHERAPY ALERT CARD at  check-in to the Emergency Department and triage nurse.  Should you have questions after your visit or need to cancel or reschedule your appointment, please contact [facility]  Dept: [phone]  and follow the prompts.  Office hours are 8:00 a.m. to 4:30 p.m. Monday - Friday. Please note that voicemails left after 4:00 p.m. may not be returned until the following business day.  We are closed weekends and major holidays. You have access to a nurse at all times for urgent questions. Please call the main number to the clinic Dept: [phone] and follow the prompts.   For any non-urgent questions, you may also contact your provider using MyChart. We now offer e-Visits for anyone 18 and older to request care online for non-urgent symptoms. For details visit [email or web address].   Also download the MyChart app! Go to the app store, search "MyChart", open the app, select [place], and log in with your MyChart username and password.  Due to Covid, a mask is required upon entering the hospital/clinic. If you do not have a mask, one will be given to you upon arrival. For doctor visits, patients may have 1 support person aged 18 or older with them. For treatment visits, patients cannot have anyone with them due to current Covid guidelines and our immunocompromised population.   

## 2021-06-02 ENCOUNTER — Other Ambulatory Visit: Payer: Self-pay | Admitting: *Deleted

## 2021-06-02 DIAGNOSIS — C9010 Plasma cell leukemia not having achieved remission: Secondary | ICD-10-CM

## 2021-06-02 MED ORDER — LENALIDOMIDE 10 MG PO CAPS: 10.0000 mg | ORAL_CAPSULE | Freq: Every day | ORAL | 0 refills | Status: DC

## 2021-06-02 NOTE — Telephone Encounter (Signed)
Received faxed refill request for Revlimid from [employer] by [employer] escribed - [employer] auth# [id number]; 06/02/21

## 2021-06-10 ENCOUNTER — Other Ambulatory Visit: Payer: Self-pay

## 2021-06-10 ENCOUNTER — Inpatient Hospital Stay: Payer: BC Managed Care – PPO

## 2021-06-10 ENCOUNTER — Inpatient Hospital Stay: Payer: BC Managed Care – PPO | Attending: Hematology

## 2021-06-10 VITALS — BP 123/79 | HR 71 | Temp 98.2°F | Resp 16 | Wt 150.5 lb

## 2021-06-10 DIAGNOSIS — C9010 Plasma cell leukemia not having achieved remission: Secondary | ICD-10-CM

## 2021-06-10 DIAGNOSIS — Z5111 Encounter for antineoplastic chemotherapy: Secondary | ICD-10-CM

## 2021-06-10 DIAGNOSIS — Z9221 Personal history of antineoplastic chemotherapy: Secondary | ICD-10-CM | POA: Diagnosis not present

## 2021-06-10 DIAGNOSIS — Z7189 Other specified counseling: Secondary | ICD-10-CM

## 2021-06-10 DIAGNOSIS — Z298 Encounter for other specified prophylactic measures: Secondary | ICD-10-CM | POA: Diagnosis present

## 2021-06-10 DIAGNOSIS — Z5112 Encounter for antineoplastic immunotherapy: Secondary | ICD-10-CM | POA: Diagnosis present

## 2021-06-10 DIAGNOSIS — Z9484 Stem cells transplant status: Secondary | ICD-10-CM | POA: Insufficient documentation

## 2021-06-10 DIAGNOSIS — C9011 Plasma cell leukemia in remission: Secondary | ICD-10-CM

## 2021-06-10 DIAGNOSIS — Z79899 Other long term (current) drug therapy: Secondary | ICD-10-CM | POA: Diagnosis not present

## 2021-06-10 DIAGNOSIS — C9031 Solitary plasmacytoma in remission: Secondary | ICD-10-CM | POA: Diagnosis not present

## 2021-06-10 DIAGNOSIS — M84551A Pathological fracture in neoplastic disease, right femur, initial encounter for fracture: Secondary | ICD-10-CM

## 2021-06-10 LAB — CBC WITH DIFFERENTIAL (CANCER CENTER ONLY)
Abs Immature Granulocytes: 0.03 10*3/uL (ref 0.00–0.07)
Basophils Absolute: 0.1 10*3/uL (ref 0.0–0.1)
Basophils Relative: 2 %
Eosinophils Absolute: 0.2 10*3/uL (ref 0.0–0.5)
Eosinophils Relative: 3 %
HCT: 29.1 % — ABNORMAL LOW (ref 39.0–52.0)
Hemoglobin: 10.2 g/dL — ABNORMAL LOW (ref 13.0–17.0)
Immature Granulocytes: 1 %
Lymphocytes Relative: 25 %
Lymphs Abs: 1.4 10*3/uL (ref 0.7–4.0)
MCH: 33 pg (ref 26.0–34.0)
MCHC: 35.1 g/dL (ref 30.0–36.0)
MCV: 94.2 fL (ref 80.0–100.0)
Monocytes Absolute: 0.5 10*3/uL (ref 0.1–1.0)
Monocytes Relative: 9 %
Neutro Abs: 3.3 10*3/uL (ref 1.7–7.7)
Neutrophils Relative %: 60 %
Platelet Count: 170 10*3/uL (ref 150–400)
RBC: 3.09 MIL/uL — ABNORMAL LOW (ref 4.22–5.81)
RDW: 15.2 % (ref 11.5–15.5)
WBC Count: 5.5 10*3/uL (ref 4.0–10.5)
nRBC: 0 % (ref 0.0–0.2)

## 2021-06-10 LAB — CMP (CANCER CENTER ONLY)
ALT: 12 U/L (ref 0–44)
AST: 11 U/L — ABNORMAL LOW (ref 15–41)
Albumin: 3.6 g/dL (ref 3.5–5.0)
Alkaline Phosphatase: 63 U/L (ref 38–126)
Anion gap: 7 (ref 5–15)
BUN: 23 mg/dL (ref 8–23)
CO2: 22 mmol/L (ref 22–32)
Calcium: 8.9 mg/dL (ref 8.9–10.3)
Chloride: 112 mmol/L — ABNORMAL HIGH (ref 98–111)
Creatinine: 1.51 mg/dL — ABNORMAL HIGH (ref 0.61–1.24)
GFR, Estimated: 52 mL/min — ABNORMAL LOW (ref >60)
Glucose, Bld: 103 mg/dL — ABNORMAL HIGH (ref 70–99)
Potassium: 4 mmol/L (ref 3.5–5.1)
Sodium: 141 mmol/L (ref 135–145)
Total Bilirubin: 0.4 mg/dL (ref 0.3–1.2)
Total Protein: 7.1 g/dL (ref 6.5–8.1)

## 2021-06-10 MED ORDER — DEXTROSE 5 % IV SOLN
56.0000 mg/m2 | Freq: Once | INTRAVENOUS | Status: AC
  Administered 2021-06-10: 100 mg via INTRAVENOUS
  Filled 2021-06-10: qty 15

## 2021-06-10 MED ORDER — SODIUM CHLORIDE 0.9 % IV SOLN
INTRAVENOUS | Status: AC
  Filled 2021-06-10: qty 250

## 2021-06-10 MED ORDER — SODIUM CHLORIDE 0.9 % IV SOLN
Freq: Once | INTRAVENOUS | Status: AC
  Filled 2021-06-10: qty 250

## 2021-06-10 MED ORDER — SODIUM CHLORIDE 0.9 % IV SOLN
20.0000 mg | Freq: Once | INTRAVENOUS | Status: AC
  Administered 2021-06-10: 20 mg via INTRAVENOUS
  Filled 2021-06-10: qty 20

## 2021-06-10 MED ORDER — ACETAMINOPHEN 325 MG PO TABS
ORAL_TABLET | ORAL | Status: AC
  Filled 2021-06-10: qty 2

## 2021-06-10 MED ORDER — FAMOTIDINE 20 MG PO TABS
ORAL_TABLET | ORAL | Status: AC
  Filled 2021-06-10: qty 1

## 2021-06-10 MED ORDER — ACETAMINOPHEN 325 MG PO TABS
650.0000 mg | ORAL_TABLET | Freq: Once | ORAL | Status: AC
  Administered 2021-06-10: 650 mg via ORAL

## 2021-06-10 MED ORDER — FAMOTIDINE 20 MG PO TABS
20.0000 mg | ORAL_TABLET | Freq: Once | ORAL | Status: AC
  Administered 2021-06-10: 20 mg via ORAL

## 2021-06-10 MED ORDER — SODIUM CHLORIDE 0.9 % IV SOLN
60.0000 mg | Freq: Once | INTRAVENOUS | Status: AC
  Administered 2021-06-10: 60 mg via INTRAVENOUS
  Filled 2021-06-10: qty 10

## 2021-06-10 MED ORDER — SODIUM CHLORIDE 0.9 % IV SOLN
Freq: Once | INTRAVENOUS | Status: DC
  Filled 2021-06-10: qty 250

## 2021-06-10 NOTE — Progress Notes (Signed)
Okay to proceed with creatinine for D1C22 per Dr. Irene Limbo

## 2021-06-10 NOTE — Patient Instructions (Signed)
[facility]  Discharge Instructions: Thank you for choosing [facility] to provide your oncology and hematology care.   If you have a lab appointment with the [facility], please go directly to the [facility] and check in at the registration area.   Wear comfortable clothing and clothing appropriate for easy access to any Portacath or PICC line.   We strive to give you quality time with your provider. You may need to reschedule your appointment if you arrive late (15 or more minutes).  Arriving late affects you and other patients whose appointments are after yours.  Also, if you miss three or more appointments without notifying the office, you may be dismissed from the clinic at the provider's discretion.      For prescription refill requests, have your pharmacy contact our office and allow 72 hours for refills to be completed.    Today you received the following chemotherapy and/or immunotherapy agents Kyrpolis and Aredia      To help prevent nausea and vomiting after your treatment, we encourage you to take your nausea medication as directed.  BELOW ARE SYMPTOMS THAT SHOULD BE REPORTED IMMEDIATELY: *FEVER GREATER THAN 100.4 F (38 C) OR HIGHER *CHILLS OR SWEATING *NAUSEA AND VOMITING THAT IS NOT CONTROLLED WITH YOUR NAUSEA MEDICATION *UNUSUAL SHORTNESS OF BREATH *UNUSUAL BRUISING OR BLEEDING *URINARY PROBLEMS (pain or burning when urinating, or frequent urination) *BOWEL PROBLEMS (unusual diarrhea, constipation, pain near the anus) TENDERNESS IN MOUTH AND THROAT WITH OR WITHOUT PRESENCE OF ULCERS (sore throat, sores in mouth, or a toothache) UNUSUAL RASH, SWELLING OR PAIN  UNUSUAL VAGINAL DISCHARGE OR ITCHING   Items with * indicate a potential emergency and should be followed up as soon as possible or go to the Emergency Department if any problems should occur.  Please show the CHEMOTHERAPY ALERT CARD or IMMUNOTHERAPY ALERT CARD at  check-in to the Emergency Department and triage nurse.  Should you have questions after your visit or need to cancel or reschedule your appointment, please contact [facility]  Dept: [phone]  and follow the prompts.  Office hours are 8:00 a.m. to 4:30 p.m. Monday - Friday. Please note that voicemails left after 4:00 p.m. may not be returned until the following business day.  We are closed weekends and major holidays. You have access to a nurse at all times for urgent questions. Please call the main number to the clinic Dept: [phone] and follow the prompts.   For any non-urgent questions, you may also contact your provider using MyChart. We now offer e-Visits for anyone 47 and older to request care online for non-urgent symptoms. For details visit [email or web address].   Also download the MyChart app! Go to the app store, search "MyChart", open the app, select [place], and log in with your MyChart username and password.  Due to Covid, a mask is required upon entering the hospital/clinic. If you do not have a mask, one will be given to you upon arrival. For doctor visits, patients may have 1 support person aged 52 or older with them. For treatment visits, patients cannot have anyone with them due to current Covid guidelines and our immunocompromised population.

## 2021-06-14 LAB — KAPPA/LAMBDA LIGHT CHAINS
Kappa free light chain: 31 mg/L — ABNORMAL HIGH (ref 3.3–19.4)
Kappa, lambda light chain ratio: 0.95 (ref 0.26–1.65)
Lambda free light chains: 32.5 mg/L — ABNORMAL HIGH (ref 5.7–26.3)

## 2021-06-23 NOTE — Progress Notes (Signed)
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 His labs today 12/14/2017 show hgb 8.3, platelets 524k. He is doing well overall. He has staples in his right upper leg following a recent surgery and has not been in touch with the surgeons for a f/u of this. He is not receiving physical therapy and is not getting around at home.  He notes that he was not scheduled for a post hospitalization nephrology f/u and has no PCP.  On review of systems, pt reports intermittent fatigue, SOB, pain to the left leg, dizziness, dry mouth and denies fever, chills, night sweats, dysuria and any other accompanying symptoms.   INTERVAL HISTORY:   Andres Fernandez presents today for follow up and continue management of his myeloma/plasma cell leukemia. The patient's last visit with [name] was on 05/13/2021. The pt reports that he is doing well overall.  The pt reports that he has been doing well. He has recently caught two viruses a month ago. He denies having COVID and notes he was having the similar symptoms like RSV of difficulty breathing, sniffling. He was giving something as treatment. He does not have a sense of smell anymore but his appetite is the same. He never had a fever and is now back to his baseline. He believes he picked this up at work. He has not received the Evusheld yet. They called while he was at work and didn't answer his call back.  Lab results today 06/24/2021 of CBC w/diff and CMP is as follows: all values are WNL except for RBC of 3.64, Hgb of 11.9, HCT of 34.8, RDW of 16.2, BUN of 33, Creatinine of 1.89, AST of 12.  On review of systems, pt reports recent viral infection and denies abdominal pain, leg swelling, fevers, chills, and any other  symptoms.   REVIEW OF SYSTEMS:   10 Point review of Systems was done is negative except as noted above.  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   Tobacco abuse     SURGICAL HISTORY: Past Surgical History:  Procedure Laterality Date   APPENDECTOMY     FEMUR IM NAIL Right 11/20/2017   Procedure: RIGHT FEMORAL INTRAMEDULLARY (IM) NAIL;  Surgeon: Nicholes Stairs, MD;  Location: [facility];  Service: Orthopedics;  Laterality: Right;    SOCIAL HISTORY: Social History   Socioeconomic History   Marital status: Single    Spouse name: Not on file   Number of children: Not on file   Years of education: Not on file   Highest education level: Not on file  Occupational History   Not on file  Tobacco Use   Smoking status: Every Day    Packs/day: 0.25    Types: Cigarettes    Last attempt to quit: 11/09/2017    Years since quitting: 3.6   Smokeless tobacco: Never   Tobacco comments:    1 cigarette/day as of 09/25/18  Vaping Use   Vaping Use: Never used  Substance and Sexual Activity   Alcohol use: Yes   Drug use: No   Sexual activity: Not on file  Other Topics Concern  Not on file  Social History Narrative   Not on file   Social Determinants of Health   Financial Resource Strain: Not on file  Food Insecurity: Not on file  Transportation Needs: Not on file  Physical Activity: Not on file  Stress: Not on file  Social Connections: Not on file  Intimate Partner Violence: Not on file    FAMILY HISTORY: Family History  Problem Relation Age of Onset   COPD Mother    Liver cancer Mother     ALLERGIES:  has No Known Allergies.  MEDICATIONS:  . Current Outpatient Medications on File Prior to Visit  Medication Sig Dispense Refill   acyclovir (ZOVIRAX) 400 MG tablet Take 1 tablet (400 mg total) by mouth daily. (Patient not taking:  Reported on 04/15/2021) 30 tablet 11   Albuterol Sulfate (PROAIR RESPICLICK) 536 (90 Base) MCG/ACT AEPB Inhale into the lungs.     aspirin EC 81 MG tablet Take 1 tablet (81 mg total) by mouth daily. (Patient taking differently: Take 81 mg by mouth at bedtime.) 60 tablet 11   cholecalciferol (VITAMIN D) 400 units TABS tablet Take 400 Units by mouth at bedtime.      lenalidomide (REVLIMID) 10 MG capsule Take 1 capsule (10 mg total) by mouth daily. Take for 21 days on, 7 days off, repeat every 28 days. 21 capsule 0   Multiple Vitamins-Minerals (MULTIVITAMIN WITH MINERALS) tablet Take 1 tablet by mouth at bedtime.      nicotine (NICODERM CQ - DOSED IN MG/24 HOURS) 14 mg/24hr patch PLACE 1 PATCH (14 MG TOTAL) ONTO THE SKIN DAILY. (Patient taking differently: Place 14 mg onto the skin daily.) 28 patch 1   ondansetron (ZOFRAN) 4 MG tablet Take 2 tablets (8 mg total) by mouth every 8 (eight) hours as needed for nausea. (Patient not taking: Reported on 04/15/2021) 30 tablet 0   tiZANidine (ZANAFLEX) 2 MG tablet Take 1 tablet (2 mg total) by mouth every 8 (eight) hours as needed for muscle spasms. (Patient not taking: Reported on 04/15/2021) 30 tablet 0   Vitamin D, Ergocalciferol, (DRISDOL) 1.25 MG (50000 UNIT) CAPS capsule Take 1 capsule (50,000 Units total) by mouth once a week. (Patient not taking: Reported on 04/15/2021) 12 capsule 6   Current Facility-Administered Medications on File Prior to Visit  Medication Dose Route Frequency Provider Last Rate Last Admin   0.9 %  sodium chloride infusion   Intravenous Once Brunetta Genera, MD       0.9 %  sodium chloride infusion   Intravenous Once Brunetta Genera, MD       0.9 %  sodium chloride infusion   Intravenous Continuous Brunetta Genera, MD       carfilzomib (KYPROLIS) 100 mg in dextrose 5 % 100 mL chemo infusion  56 mg/m2 (Treatment Plan Recorded) Intravenous Once Brunetta Genera, MD        PHYSICAL EXAMINATION:  ECOG FS:1 - Symptomatic  but completely ambulatory  There were no vitals filed for this visit.  Wt Readings from Last 3 Encounters:  06/24/21 151 lb 1.9 oz (68.5 kg)  06/10/21 150 lb 8 oz (68.3 kg)  05/13/21 153 lb 14.4 oz (69.8 kg)   There is no height or weight on file to calculate BMI.    Exam was given in infusion.   GENERAL:alert, in no acute distress and comfortable SKIN: no acute rashes, no significant lesions EYES: conjunctiva are pink and non-injected, sclera anicteric OROPHARYNX: MMM, no exudates, no oropharyngeal  erythema or ulceration NECK: supple, no JVD LYMPH:  no palpable lymphadenopathy in the cervical, axillary or inguinal regions LUNGS: clear to auscultation b/l with normal respiratory effort HEART: regular rate & rhythm ABDOMEN:  normoactive bowel sounds , non tender, not distended. Extremity: no pedal edema PSYCH: alert & oriented x 3 with fluent speech NEURO: no focal motor/sensory deficits  LABORATORY DATA:  I have reviewed the data as listed  CBC Latest Ref Rng & Units 06/24/2021 06/10/2021 05/27/2021  WBC 4.0 - 10.5 K/uL 6.2 5.5 5.7  Hemoglobin 13.0 - 17.0 g/dL 11.9(L) 10.2(L) 12.1(L)  Hematocrit 39.0 - 52.0 % 34.8(L) 29.1(L) 35.4(L)  Platelets 150 - 400 K/uL 174 170 130(L)   CBC    Component Value Date/Time   WBC 6.2 06/24/2021 0922   RBC 3.64 (L) 06/24/2021 0922   HGB 11.9 (L) 06/24/2021 0922   HGB 10.2 (L) 06/10/2021 0940   HGB 8.3 (L) 12/14/2017 1230   HCT 34.8 (L) 06/24/2021 0922   HCT 23.9 (L) 12/14/2017 1230   PLT 174 06/24/2021 0922   PLT 170 06/10/2021 0940   PLT 524 (H) 12/14/2017 1230   MCV 95.6 06/24/2021 0922   MCV 88.4 12/14/2017 1230   MCH 32.7 06/24/2021 0922   MCHC 34.2 06/24/2021 0922   RDW 16.2 (H) 06/24/2021 0922   RDW 15.3 (H) 12/14/2017 1230   LYMPHSABS 1.7 06/24/2021 0922   LYMPHSABS 1.4 12/14/2017 1230   MONOABS 0.8 06/24/2021 0922   MONOABS 0.6 12/14/2017 1230   EOSABS 0.3 06/24/2021 0922   EOSABS 0.1 12/14/2017 1230   BASOSABS 0.1  06/24/2021 0922   BASOSABS 0.1 12/14/2017 1230     CMP Latest Ref Rng & Units 06/24/2021 06/10/2021 05/27/2021  Glucose 70 - 99 mg/dL 81 103(H) 74  BUN 8 - 23 mg/dL 33(H) 23 25(H)  Creatinine 0.61 - 1.24 mg/dL 1.89(H) 1.51(H) 1.86(H)  Sodium 135 - 145 mmol/L 142 141 139  Potassium 3.5 - 5.1 mmol/L 4.7 4.0 3.7  Chloride 98 - 111 mmol/L 110 112(H) 108  CO2 22 - 32 mmol/L 22 22 24   Calcium 8.9 - 10.3 mg/dL 8.9 8.9 8.6(L)  Total Protein 6.5 - 8.1 g/dL 7.1 7.1 7.0  Total Bilirubin 0.3 - 1.2 mg/dL 0.5 0.4 0.4  Alkaline Phos 38 - 126 U/L 63 63 59  AST 15 - 41 U/L 12(L) 11(L) 17  ALT 0 - 44 U/L 11 12 15     03/09/20 ([id number]) [facility] Marrow Biopsy at [facility]   07/02/2019 Bone Marrow Biopsy ([id number]) at [facility]    08/07/18 BM Bx:        RADIOGRAPHIC STUDIES: I have personally reviewed the radiological images as listed and agreed with the findings in the report. No results found.  ASSESSMENT & PLAN:    65 y.o. 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   Initial BM Bx showed >95% involvement with kappa restricted serum free light chains. 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 MoL Cy-   08/07/18 BM Bx revealed normocellular bone marrow with trilineage hematopoiesis and 1% plasma cells which indicates the pt is in remission 08/06/18 PET/CT revealed  Expansile lytic lesion involving the left iliac bone, unchanged. Lytic lesion involving the sternum, unchanged. While  both are suspicious for myeloma, neither lesion is FDG avid. No hypermetabolic osseous lesions in the visualized axial and appendicular skeleton. Please note that the bilateral lower extremities were not  imaged. No suspicious lymphadenopathy. Spleen is normal in size.    Pt began Ninlaro 62m maintenance after pt finished 13 cycles of CyBorD and BM bx showed only 1% plasma cells, then resumed CyBorD and held Ninlaro in anticipation of his tandem bone marrow [facility]plants [facility] in late December 2019 [facility]  11/19/18 High dose chemotherapy with Melphalan 147mm2 and autologous stem cell rescue  03/20/19 High dose chemotherapy with Melphalan 20071m2 and autologous stem cell rescue. Non-infectious diarrhea, uncomplicated course otherwise.  07/02/2019 PET scan with results revealing "1. No FDG avid bone lesions are identified. 2.  Asymmetric uptake within the right, greater than left, palatine tonsils with mild fullness of the right tonsillar soft tissue. ENT consult vs attention on follow up suggested. 3.  Innumerable mixed sclerotic and lytic lesions throughout the axial and appendicular skeleton without associated hypermetabolic activity. 4.  Ancillary CT findings as above."  07/02/2019 bone marrow biopsy ([id number])with results showing: "Trilineage hematopoieses with maturation. No plasma cell myeloma identified. Mild anemia and No Rouleaux or circulating plasma cells identified in the peripheral blood."  2) s/p Prophylactic IM nailing for rt femur  [facility] reported improvement and is currently progressively back to full time work.  3) Thrombocytopenia resolved   4) Renal insufficiency -- likely primarily related to Myeloma. stable Plan -maintain follow up with nephrology referral to Dr ColMarval RegalaNarda Amberdney for continued treatment -Pt completed 8 weeks of maverick for hep c with Dr ComLinus Salmons 07/08/18.  -Kidney numbers improved. Creatinine in the 1.7-2.2 range   5) h/o tobacco abuse-counseled on smoking cessation -Currently not smoking.   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 -Hgb improved/stable   -monitor  PLAN: -Discussed pt labwork today, 06/24/2021; counts improved and chemistries stable. -Advised pt we can continue with treatment as planned due to all symptoms resolved.  -Will try to set up Evusheld today. If not, then get done ASAP. -Advised pt to continue to wear mask and take all precautions possible. -Recommended pt continue to wear a mask and be cautious of social surroundings. -Recommended pt drink 48-64 oz water daily. -The pt has no prohibitive toxicities from continuing Carfilzomib every two weeks at this time. -The pt has no prohibitive toxicities from continuing 10 mg Revlimid 3 weeks on, 1 week off at this time. -Continue daily ASA and Vitamin D. -Continue Pamidronate q8weeks. -Will see back in 6 weeks with labs.   FOLLOW UP: Evusheld today Plz schedule next 8 doses of every 2 weekly carfilzomib with portflush and labs MD visit in 6 weeks -Continue Pamidronate q8weeks- plz schedule next 3 doses    The total time spent in the appointment was 30  minutes and more than 50% was on counseling and direct patient cares, ordering and mx of chemotherapy  All of the patient's questions were answered with apparent satisfaction. The patient knows to call the clinic with any problems, questions or concerns.   GauSullivan Lone MS [place] SCHColorado [facility] HospitalHNorth Pointe Surgical Centermatology/Oncology Physician ConLakeview Medical CenterOffice):       [phone] cell):  [phone]):           [phone]  I, Reinaldo Raddle, am acting as scribe for Dr. Sullivan Lone, MD.   .I have reviewed the above documentation for accuracy and completeness, and I agree with the above. Brunetta Genera MD

## 2021-06-24 ENCOUNTER — Other Ambulatory Visit: Payer: Self-pay

## 2021-06-24 ENCOUNTER — Inpatient Hospital Stay: Payer: BC Managed Care – PPO

## 2021-06-24 ENCOUNTER — Inpatient Hospital Stay (HOSPITAL_BASED_OUTPATIENT_CLINIC_OR_DEPARTMENT_OTHER): Payer: BC Managed Care – PPO | Admitting: Hematology

## 2021-06-24 VITALS — BP 127/88 | HR 81 | Temp 98.0°F | Resp 19 | Wt 151.1 lb

## 2021-06-24 DIAGNOSIS — C9031 Solitary plasmacytoma in remission: Secondary | ICD-10-CM | POA: Diagnosis not present

## 2021-06-24 DIAGNOSIS — C9011 Plasma cell leukemia in remission: Secondary | ICD-10-CM | POA: Diagnosis not present

## 2021-06-24 DIAGNOSIS — Z5111 Encounter for antineoplastic chemotherapy: Secondary | ICD-10-CM

## 2021-06-24 DIAGNOSIS — C9010 Plasma cell leukemia not having achieved remission: Secondary | ICD-10-CM

## 2021-06-24 DIAGNOSIS — Z7189 Other specified counseling: Secondary | ICD-10-CM

## 2021-06-24 LAB — CMP (CANCER CENTER ONLY)
ALT: 11 U/L (ref 0–44)
AST: 12 U/L — ABNORMAL LOW (ref 15–41)
Albumin: 3.8 g/dL (ref 3.5–5.0)
Alkaline Phosphatase: 63 U/L (ref 38–126)
Anion gap: 10 (ref 5–15)
BUN: 33 mg/dL — ABNORMAL HIGH (ref 8–23)
CO2: 22 mmol/L (ref 22–32)
Calcium: 8.9 mg/dL (ref 8.9–10.3)
Chloride: 110 mmol/L (ref 98–111)
Creatinine: 1.89 mg/dL — ABNORMAL HIGH (ref 0.61–1.24)
GFR, Estimated: 39 mL/min — ABNORMAL LOW (ref >60)
Glucose, Bld: 81 mg/dL (ref 70–99)
Potassium: 4.7 mmol/L (ref 3.5–5.1)
Sodium: 142 mmol/L (ref 135–145)
Total Bilirubin: 0.5 mg/dL (ref 0.3–1.2)
Total Protein: 7.1 g/dL (ref 6.5–8.1)

## 2021-06-24 LAB — CBC WITH DIFFERENTIAL/PLATELET
Abs Immature Granulocytes: 0.02 10*3/uL (ref 0.00–0.07)
Basophils Absolute: 0.1 10*3/uL (ref 0.0–0.1)
Basophils Relative: 1 %
Eosinophils Absolute: 0.3 10*3/uL (ref 0.0–0.5)
Eosinophils Relative: 5 %
HCT: 34.8 % — ABNORMAL LOW (ref 39.0–52.0)
Hemoglobin: 11.9 g/dL — ABNORMAL LOW (ref 13.0–17.0)
Immature Granulocytes: 0 %
Lymphocytes Relative: 28 %
Lymphs Abs: 1.7 10*3/uL (ref 0.7–4.0)
MCH: 32.7 pg (ref 26.0–34.0)
MCHC: 34.2 g/dL (ref 30.0–36.0)
MCV: 95.6 fL (ref 80.0–100.0)
Monocytes Absolute: 0.8 10*3/uL (ref 0.1–1.0)
Monocytes Relative: 13 %
Neutro Abs: 3.2 10*3/uL (ref 1.7–7.7)
Neutrophils Relative %: 53 %
Platelets: 174 10*3/uL (ref 150–400)
RBC: 3.64 MIL/uL — ABNORMAL LOW (ref 4.22–5.81)
RDW: 16.2 % — ABNORMAL HIGH (ref 11.5–15.5)
WBC: 6.2 10*3/uL (ref 4.0–10.5)
nRBC: 0 % (ref 0.0–0.2)

## 2021-06-24 MED ORDER — DEXTROSE 5 % IV SOLN
56.0000 mg/m2 | Freq: Once | INTRAVENOUS | Status: AC
  Administered 2021-06-24: 100 mg via INTRAVENOUS
  Filled 2021-06-24: qty 30

## 2021-06-24 MED ORDER — SODIUM CHLORIDE 0.9 % IV SOLN
INTRAVENOUS | Status: AC
  Filled 2021-06-24: qty 250

## 2021-06-24 MED ORDER — FAMOTIDINE 20 MG PO TABS
ORAL_TABLET | ORAL | Status: AC
  Filled 2021-06-24: qty 1

## 2021-06-24 MED ORDER — ACETAMINOPHEN 325 MG PO TABS
ORAL_TABLET | ORAL | Status: AC
  Filled 2021-06-24: qty 2

## 2021-06-24 MED ORDER — SODIUM CHLORIDE 0.9 % IV SOLN
Freq: Once | INTRAVENOUS | Status: DC
  Filled 2021-06-24: qty 250

## 2021-06-24 MED ORDER — SODIUM CHLORIDE 0.9 % IV SOLN
20.0000 mg | Freq: Once | INTRAVENOUS | Status: AC
  Administered 2021-06-24: 20 mg via INTRAVENOUS
  Filled 2021-06-24: qty 20

## 2021-06-24 MED ORDER — ACETAMINOPHEN 325 MG PO TABS
650.0000 mg | ORAL_TABLET | Freq: Once | ORAL | Status: AC
  Administered 2021-06-24: 650 mg via ORAL

## 2021-06-24 MED ORDER — FAMOTIDINE 20 MG PO TABS
20.0000 mg | ORAL_TABLET | Freq: Once | ORAL | Status: AC
  Administered 2021-06-24: 20 mg via ORAL

## 2021-06-24 NOTE — Patient Instructions (Signed)
[facility] MEDICAL ONCOLOGY  Discharge Instructions: Thank you for choosing [facility] to provide your oncology and hematology care.   If you have a lab appointment with the Cancer Center, please go directly to the Cancer Center and check in at the registration area.   Wear comfortable clothing and clothing appropriate for easy access to any Portacath or PICC line.   We strive to give you quality time with your provider. You may need to reschedule your appointment if you arrive late (15 or more minutes).  Arriving late affects you and other patients whose appointments are after yours.  Also, if you miss three or more appointments without notifying the office, you may be dismissed from the clinic at the provider's discretion.      For prescription refill requests, have your pharmacy contact our office and allow 72 hours for refills to be completed.    Today you received the following chemotherapy and/or immunotherapy agent: Carfilzomib (Kyprolis).    To help prevent nausea and vomiting after your treatment, we encourage you to take your nausea medication as directed.  BELOW ARE SYMPTOMS THAT SHOULD BE REPORTED IMMEDIATELY: *FEVER GREATER THAN 100.4 F (38 C) OR HIGHER *CHILLS OR SWEATING *NAUSEA AND VOMITING THAT IS NOT CONTROLLED WITH YOUR NAUSEA MEDICATION *UNUSUAL SHORTNESS OF BREATH *UNUSUAL BRUISING OR BLEEDING *URINARY PROBLEMS (pain or burning when urinating, or frequent urination) *BOWEL PROBLEMS (unusual diarrhea, constipation, pain near the anus) TENDERNESS IN MOUTH AND THROAT WITH OR WITHOUT PRESENCE OF ULCERS (sore throat, sores in mouth, or a toothache) UNUSUAL RASH, SWELLING OR PAIN  UNUSUAL VAGINAL DISCHARGE OR ITCHING   Items with * indicate a potential emergency and should be followed up as soon as possible or go to the Emergency Department if any problems should occur.  Please show the CHEMOTHERAPY ALERT CARD or IMMUNOTHERAPY ALERT CARD at  check-in to the Emergency Department and triage nurse.  Should you have questions after your visit or need to cancel or reschedule your appointment, please contact [facility] MEDICAL ONCOLOG[facility]  Dept: [phone]  and follow the prompts.  Office hours are 8:00 a.m. to 4:30 p.m. Monday - Friday. Please note that voicemails left after 4:00 p.m. may not be returned until the following business day.  We are closed weekends and major holidays. You have access to a nurse at all times for urgent questions. Please call the main number to the clinic Dept: [phone] and follow the prompts.   For any non-urgent questions, you may also contact your provider using MyChart. We now offer e-Visits for anyone 18 and older to request care online for non-urgent symptoms. For details visit [email or web address].   Also download the MyChart app! Go to the app store, search "MyChart", open the app, select [facility], and log in with your MyChart username and password.  Due to Covid, a mask is required upon entering the hospital/clinic. If you do not have a mask, one will be given to you upon arrival. For doctor visits, patients may have 1 support person aged 18 or older with them. For treatment visits, patients cannot have anyone with them due to current Covid guidelines and our immunocompromised population.   

## 2021-06-24 NOTE — Progress Notes (Signed)
To treat today with elevated Scr per Dr. Irene Limbo

## 2021-06-27 ENCOUNTER — Telehealth: Payer: Self-pay | Admitting: Hematology

## 2021-06-27 NOTE — Telephone Encounter (Signed)
Left message with follow-up appointments per 7/15 los.

## 2021-06-30 ENCOUNTER — Encounter: Payer: Self-pay | Admitting: Hematology

## 2021-07-01 ENCOUNTER — Other Ambulatory Visit: Payer: Self-pay

## 2021-07-01 DIAGNOSIS — C9010 Plasma cell leukemia not having achieved remission: Secondary | ICD-10-CM

## 2021-07-01 MED ORDER — LENALIDOMIDE 10 MG PO CAPS: 10.0000 mg | ORAL_CAPSULE | Freq: Every day | ORAL | 0 refills | Status: DC

## 2021-07-08 ENCOUNTER — Inpatient Hospital Stay: Payer: BC Managed Care – PPO

## 2021-07-08 ENCOUNTER — Other Ambulatory Visit: Payer: Self-pay | Admitting: Hematology

## 2021-07-08 ENCOUNTER — Other Ambulatory Visit: Payer: Self-pay

## 2021-07-08 VITALS — BP 110/71 | HR 68 | Temp 98.1°F | Resp 16

## 2021-07-08 DIAGNOSIS — C9031 Solitary plasmacytoma in remission: Secondary | ICD-10-CM | POA: Diagnosis not present

## 2021-07-08 DIAGNOSIS — Z7189 Other specified counseling: Secondary | ICD-10-CM

## 2021-07-08 DIAGNOSIS — C9010 Plasma cell leukemia not having achieved remission: Secondary | ICD-10-CM

## 2021-07-08 DIAGNOSIS — C9000 Multiple myeloma not having achieved remission: Secondary | ICD-10-CM

## 2021-07-08 DIAGNOSIS — Z5111 Encounter for antineoplastic chemotherapy: Secondary | ICD-10-CM

## 2021-07-08 DIAGNOSIS — C9011 Plasma cell leukemia in remission: Secondary | ICD-10-CM

## 2021-07-08 LAB — CMP (CANCER CENTER ONLY)
ALT: 13 U/L (ref 0–44)
AST: 12 U/L — ABNORMAL LOW (ref 15–41)
Albumin: 3.8 g/dL (ref 3.5–5.0)
Alkaline Phosphatase: 62 U/L (ref 38–126)
Anion gap: 9 (ref 5–15)
BUN: 34 mg/dL — ABNORMAL HIGH (ref 8–23)
CO2: 19 mmol/L — ABNORMAL LOW (ref 22–32)
Calcium: 9.1 mg/dL (ref 8.9–10.3)
Chloride: 114 mmol/L — ABNORMAL HIGH (ref 98–111)
Creatinine: 1.67 mg/dL — ABNORMAL HIGH (ref 0.61–1.24)
GFR, Estimated: 45 mL/min — ABNORMAL LOW (ref >60)
Glucose, Bld: 115 mg/dL — ABNORMAL HIGH (ref 70–99)
Potassium: 4.3 mmol/L (ref 3.5–5.1)
Sodium: 142 mmol/L (ref 135–145)
Total Bilirubin: 0.5 mg/dL (ref 0.3–1.2)
Total Protein: 6.8 g/dL (ref 6.5–8.1)

## 2021-07-08 LAB — CBC WITH DIFFERENTIAL (CANCER CENTER ONLY)
Abs Immature Granulocytes: 0.03 10*3/uL (ref 0.00–0.07)
Basophils Absolute: 0.1 10*3/uL (ref 0.0–0.1)
Basophils Relative: 3 %
Eosinophils Absolute: 0.1 10*3/uL (ref 0.0–0.5)
Eosinophils Relative: 2 %
HCT: 33.9 % — ABNORMAL LOW (ref 39.0–52.0)
Hemoglobin: 11.9 g/dL — ABNORMAL LOW (ref 13.0–17.0)
Immature Granulocytes: 1 %
Lymphocytes Relative: 27 %
Lymphs Abs: 1.4 10*3/uL (ref 0.7–4.0)
MCH: 33.4 pg (ref 26.0–34.0)
MCHC: 35.1 g/dL (ref 30.0–36.0)
MCV: 95.2 fL (ref 80.0–100.0)
Monocytes Absolute: 0.6 10*3/uL (ref 0.1–1.0)
Monocytes Relative: 12 %
Neutro Abs: 2.9 10*3/uL (ref 1.7–7.7)
Neutrophils Relative %: 55 %
Platelet Count: 211 10*3/uL (ref 150–400)
RBC: 3.56 MIL/uL — ABNORMAL LOW (ref 4.22–5.81)
RDW: 15.7 % — ABNORMAL HIGH (ref 11.5–15.5)
WBC Count: 5.3 10*3/uL (ref 4.0–10.5)
nRBC: 0 % (ref 0.0–0.2)

## 2021-07-08 MED ORDER — CILGAVIMAB (PART OF EVUSHELD) INJECTION
300.0000 mg | Freq: Once | INTRAMUSCULAR | Status: AC
  Administered 2021-07-08: 300 mg via INTRAMUSCULAR
  Filled 2021-07-08: qty 3

## 2021-07-08 MED ORDER — ACETAMINOPHEN 325 MG PO TABS
ORAL_TABLET | ORAL | Status: AC
  Filled 2021-07-08: qty 2

## 2021-07-08 MED ORDER — EPINEPHRINE 0.3 MG/0.3ML IJ SOAJ: 0.3000 mg | Freq: Once | INTRAMUSCULAR | Status: DC | PRN

## 2021-07-08 MED ORDER — FAMOTIDINE 20 MG PO TABS
ORAL_TABLET | ORAL | Status: AC
  Filled 2021-07-08: qty 1

## 2021-07-08 MED ORDER — SODIUM CHLORIDE 0.9 % IV SOLN
Freq: Once | INTRAVENOUS | Status: DC
  Filled 2021-07-08: qty 250

## 2021-07-08 MED ORDER — FAMOTIDINE 20 MG PO TABS
20.0000 mg | ORAL_TABLET | Freq: Once | ORAL | Status: AC
  Administered 2021-07-08: 20 mg via ORAL

## 2021-07-08 MED ORDER — TIXAGEVIMAB (PART OF EVUSHELD) INJECTION
300.0000 mg | Freq: Once | INTRAMUSCULAR | Status: DC
  Filled 2021-07-08: qty 3

## 2021-07-08 MED ORDER — ACETAMINOPHEN 325 MG PO TABS
650.0000 mg | ORAL_TABLET | Freq: Once | ORAL | Status: AC
  Administered 2021-07-08: 650 mg via ORAL

## 2021-07-08 MED ORDER — METHYLPREDNISOLONE SODIUM SUCC 125 MG IJ SOLR: 125.0000 mg | Freq: Once | INTRAMUSCULAR | Status: DC | PRN

## 2021-07-08 MED ORDER — SODIUM CHLORIDE 0.9 % IV SOLN
INTRAVENOUS | Status: AC
  Filled 2021-07-08 (×2): qty 250

## 2021-07-08 MED ORDER — SODIUM CHLORIDE 0.9 % IV SOLN
20.0000 mg | Freq: Once | INTRAVENOUS | Status: AC
  Administered 2021-07-08: 20 mg via INTRAVENOUS
  Filled 2021-07-08: qty 20

## 2021-07-08 MED ORDER — CILGAVIMAB (PART OF EVUSHELD) INJECTION
300.0000 mg | Freq: Once | INTRAMUSCULAR | Status: DC
  Filled 2021-07-08: qty 3

## 2021-07-08 MED ORDER — DIPHENHYDRAMINE HCL 50 MG/ML IJ SOLN: 50.0000 mg | Freq: Once | INTRAMUSCULAR | Status: DC | PRN

## 2021-07-08 MED ORDER — ALBUTEROL SULFATE HFA 108 (90 BASE) MCG/ACT IN AERS: 2.0000 | INHALATION_SPRAY | Freq: Once | RESPIRATORY_TRACT | Status: DC | PRN

## 2021-07-08 MED ORDER — DEXTROSE 5 % IV SOLN
56.0000 mg/m2 | Freq: Once | INTRAVENOUS | Status: AC
  Administered 2021-07-08: 100 mg via INTRAVENOUS
  Filled 2021-07-08: qty 30

## 2021-07-08 MED ORDER — TIXAGEVIMAB (PART OF EVUSHELD) INJECTION
300.0000 mg | Freq: Once | INTRAMUSCULAR | Status: AC
  Administered 2021-07-08: 300 mg via INTRAMUSCULAR
  Filled 2021-07-08: qty 3

## 2021-07-08 NOTE — Progress Notes (Signed)
IR port a cath placement order placed per patients preference to have port a cath for continued myeloma treatment.

## 2021-07-08 NOTE — Progress Notes (Signed)
Ok to treat with SCr 1.67 per Dr Irene Limbo.    Pt tolerated Evusheld injections without complications.  Pt observed 30 minutes post injections per order.

## 2021-07-08 NOTE — Patient Instructions (Signed)
[facility] MEDICAL ONCOLOGY  Discharge Instructions: Thank you for choosing [facility] to provide your oncology and hematology care.   If you have a lab appointment with the Cancer Center, please go directly to the Cancer Center and check in at the registration area.   Wear comfortable clothing and clothing appropriate for easy access to any Portacath or PICC line.   We strive to give you quality time with your provider. You may need to reschedule your appointment if you arrive late (15 or more minutes).  Arriving late affects you and other patients whose appointments are after yours.  Also, if you miss three or more appointments without notifying the office, you may be dismissed from the clinic at the provider's discretion.      For prescription refill requests, have your pharmacy contact our office and allow 72 hours for refills to be completed.    Today you received the following chemotherapy and/or immunotherapy agent: Carfilzomib (Kyprolis).    To help prevent nausea and vomiting after your treatment, we encourage you to take your nausea medication as directed.  BELOW ARE SYMPTOMS THAT SHOULD BE REPORTED IMMEDIATELY: *FEVER GREATER THAN 100.4 F (38 C) OR HIGHER *CHILLS OR SWEATING *NAUSEA AND VOMITING THAT IS NOT CONTROLLED WITH YOUR NAUSEA MEDICATION *UNUSUAL SHORTNESS OF BREATH *UNUSUAL BRUISING OR BLEEDING *URINARY PROBLEMS (pain or burning when urinating, or frequent urination) *BOWEL PROBLEMS (unusual diarrhea, constipation, pain near the anus) TENDERNESS IN MOUTH AND THROAT WITH OR WITHOUT PRESENCE OF ULCERS (sore throat, sores in mouth, or a toothache) UNUSUAL RASH, SWELLING OR PAIN  UNUSUAL VAGINAL DISCHARGE OR ITCHING   Items with * indicate a potential emergency and should be followed up as soon as possible or go to the Emergency Department if any problems should occur.  Please show the CHEMOTHERAPY ALERT CARD or IMMUNOTHERAPY ALERT CARD at  check-in to the Emergency Department and triage nurse.  Should you have questions after your visit or need to cancel or reschedule your appointment, please contact [facility]  Dept: [phone]  and follow the prompts.  Office hours are 8:00 a.m. to 4:30 p.m. Monday - Friday. Please note that voicemails left after 4:00 p.m. may not be returned until the following business day.  We are closed weekends and major holidays. You have access to a nurse at all times for urgent questions. Please call the main number to the clinic Dept: [phone] and follow the prompts.   For any non-urgent questions, you may also contact your provider using MyChart. We now offer e-Visits for anyone 18 and older to request care online for non-urgent symptoms. For details visit [email or web address].   Also download the MyChart app! Go to the app store, search "MyChart", open the app, select [facility], and log in with your MyChart username and password.  Due to Covid, a mask is required upon entering the hospital/clinic. If you do not have a mask, one will be given to you upon arrival. For doctor visits, patients may have 1 support person aged 18 or older with them. For treatment visits, patients cannot have anyone with them due to current Covid guidelines and our immunocompromised population.   

## 2021-07-11 LAB — KAPPA/LAMBDA LIGHT CHAINS
Kappa free light chain: 23 mg/L — ABNORMAL HIGH (ref 3.3–19.4)
Kappa, lambda light chain ratio: 1.1 (ref 0.26–1.65)
Lambda free light chains: 20.9 mg/L (ref 5.7–26.3)

## 2021-07-21 MED FILL — Dexamethasone Sodium Phosphate Inj 100 MG/10ML: INTRAMUSCULAR | Qty: 2 | Status: AC

## 2021-07-22 ENCOUNTER — Other Ambulatory Visit: Payer: Self-pay

## 2021-07-22 ENCOUNTER — Inpatient Hospital Stay: Payer: BC Managed Care – PPO | Attending: Hematology

## 2021-07-22 ENCOUNTER — Inpatient Hospital Stay: Payer: BC Managed Care – PPO

## 2021-07-22 VITALS — BP 120/73 | HR 66 | Temp 97.8°F | Resp 17 | Wt 153.5 lb

## 2021-07-22 DIAGNOSIS — C9010 Plasma cell leukemia not having achieved remission: Secondary | ICD-10-CM

## 2021-07-22 DIAGNOSIS — C9031 Solitary plasmacytoma in remission: Secondary | ICD-10-CM | POA: Diagnosis not present

## 2021-07-22 DIAGNOSIS — Z5112 Encounter for antineoplastic immunotherapy: Secondary | ICD-10-CM | POA: Insufficient documentation

## 2021-07-22 DIAGNOSIS — I10 Essential (primary) hypertension: Secondary | ICD-10-CM | POA: Insufficient documentation

## 2021-07-22 DIAGNOSIS — Z7982 Long term (current) use of aspirin: Secondary | ICD-10-CM | POA: Insufficient documentation

## 2021-07-22 DIAGNOSIS — Z298 Encounter for other specified prophylactic measures: Secondary | ICD-10-CM | POA: Insufficient documentation

## 2021-07-22 DIAGNOSIS — F17210 Nicotine dependence, cigarettes, uncomplicated: Secondary | ICD-10-CM | POA: Diagnosis not present

## 2021-07-22 DIAGNOSIS — Z79899 Other long term (current) drug therapy: Secondary | ICD-10-CM | POA: Insufficient documentation

## 2021-07-22 DIAGNOSIS — Z5111 Encounter for antineoplastic chemotherapy: Secondary | ICD-10-CM

## 2021-07-22 DIAGNOSIS — Z7189 Other specified counseling: Secondary | ICD-10-CM

## 2021-07-22 LAB — CBC WITH DIFFERENTIAL/PLATELET
Abs Immature Granulocytes: 0.02 10*3/uL (ref 0.00–0.07)
Basophils Absolute: 0.1 10*3/uL (ref 0.0–0.1)
Basophils Relative: 2 %
Eosinophils Absolute: 0.4 10*3/uL (ref 0.0–0.5)
Eosinophils Relative: 6 %
HCT: 35.1 % — ABNORMAL LOW (ref 39.0–52.0)
Hemoglobin: 12.2 g/dL — ABNORMAL LOW (ref 13.0–17.0)
Immature Granulocytes: 0 %
Lymphocytes Relative: 26 %
Lymphs Abs: 1.8 10*3/uL (ref 0.7–4.0)
MCH: 33.1 pg (ref 26.0–34.0)
MCHC: 34.8 g/dL (ref 30.0–36.0)
MCV: 95.1 fL (ref 80.0–100.0)
Monocytes Absolute: 0.8 10*3/uL (ref 0.1–1.0)
Monocytes Relative: 12 %
Neutro Abs: 3.7 10*3/uL (ref 1.7–7.7)
Neutrophils Relative %: 54 %
Platelets: 181 10*3/uL (ref 150–400)
RBC: 3.69 MIL/uL — ABNORMAL LOW (ref 4.22–5.81)
RDW: 15.6 % — ABNORMAL HIGH (ref 11.5–15.5)
WBC: 6.7 10*3/uL (ref 4.0–10.5)
nRBC: 0 % (ref 0.0–0.2)

## 2021-07-22 LAB — CMP (CANCER CENTER ONLY)
ALT: 16 U/L (ref 0–44)
AST: 11 U/L — ABNORMAL LOW (ref 15–41)
Albumin: 3.8 g/dL (ref 3.5–5.0)
Alkaline Phosphatase: 65 U/L (ref 38–126)
Anion gap: 10 (ref 5–15)
BUN: 24 mg/dL — ABNORMAL HIGH (ref 8–23)
CO2: 19 mmol/L — ABNORMAL LOW (ref 22–32)
Calcium: 8.8 mg/dL — ABNORMAL LOW (ref 8.9–10.3)
Chloride: 112 mmol/L — ABNORMAL HIGH (ref 98–111)
Creatinine: 1.74 mg/dL — ABNORMAL HIGH (ref 0.61–1.24)
GFR, Estimated: 43 mL/min — ABNORMAL LOW (ref >60)
Glucose, Bld: 100 mg/dL — ABNORMAL HIGH (ref 70–99)
Potassium: 4.2 mmol/L (ref 3.5–5.1)
Sodium: 141 mmol/L (ref 135–145)
Total Bilirubin: 0.4 mg/dL (ref 0.3–1.2)
Total Protein: 6.8 g/dL (ref 6.5–8.1)

## 2021-07-22 MED ORDER — SODIUM CHLORIDE 0.9 % IV SOLN
20.0000 mg | Freq: Once | INTRAVENOUS | Status: AC
  Administered 2021-07-22: 20 mg via INTRAVENOUS
  Filled 2021-07-22: qty 20

## 2021-07-22 MED ORDER — FAMOTIDINE 20 MG PO TABS
20.0000 mg | ORAL_TABLET | Freq: Once | ORAL | Status: AC
  Administered 2021-07-22: 20 mg via ORAL
  Filled 2021-07-22: qty 1

## 2021-07-22 MED ORDER — DEXTROSE 5 % IV SOLN
56.0000 mg/m2 | Freq: Once | INTRAVENOUS | Status: AC
  Administered 2021-07-22: 100 mg via INTRAVENOUS
  Filled 2021-07-22: qty 30

## 2021-07-22 MED ORDER — SODIUM CHLORIDE 0.9 % IV SOLN
Freq: Once | INTRAVENOUS | Status: DC
  Filled 2021-07-22: qty 250

## 2021-07-22 MED ORDER — ACETAMINOPHEN 325 MG PO TABS
650.0000 mg | ORAL_TABLET | Freq: Once | ORAL | Status: AC
  Administered 2021-07-22: 650 mg via ORAL
  Filled 2021-07-22: qty 2

## 2021-07-22 MED ORDER — SODIUM CHLORIDE 0.9 % IV SOLN
Freq: Once | INTRAVENOUS | Status: AC
  Filled 2021-07-22: qty 250

## 2021-07-22 MED ORDER — SODIUM CHLORIDE 0.9 % IV SOLN
INTRAVENOUS | Status: AC
  Filled 2021-07-22: qty 250

## 2021-07-22 NOTE — Patient Instructions (Signed)
[facility]COL[facility]  Discharge Instructions: Thank you for choosing [facility] to provide your oncology and hematology care.   If you have a lab appointment with the Cancer Center, please go directly to the Cancer Center and check in at the registration area.   Wear comfortable clothing and clothing appropriate for easy access to any Portacath or PICC line.   We strive to give you quality time with your provider. You may need to reschedule your appointment if you arrive late (15 or more minutes).  Arriving late affects you and other patients whose appointments are after yours.  Also, if you miss three or more appointments without notifying the office, you may be dismissed from the clinic at the provider's discretion.      For prescription refill requests, have your pharmacy contact our office and allow 72 hours for refills to be completed.    Today you received the following chemotherapy and/or immunotherapy agents: Kyprolis    To help prevent nausea and vomiting after your treatment, we encourage you to take your nausea medication as directed.  BELOW ARE SYMPTOMS THAT SHOULD BE REPORTED IMMEDIATELY: . *FEVER GREATER THAN 100.4 F (38 C) OR HIGHER . *CHILLS OR SWEATING . *NAUSEA AND VOMITING THAT IS NOT CONTROLLED WITH YOUR NAUSEA MEDICATION . *UNUSUAL SHORTNESS OF BREATH . *UNUSUAL BRUISING OR BLEEDING . *URINARY PROBLEMS (pain or burning when urinating, or frequent urination) . *BOWEL PROBLEMS (unusual diarrhea, constipation, pain near the anus) . TENDERNESS IN MOUTH AND THROAT WITH OR WITHOUT PRESENCE OF ULCERS (sore throat, sores in mouth, or a toothache) . UNUSUAL RASH, SWELLING OR PAIN  . UNUSUAL VAGINAL DISCHARGE OR ITCHING   Items with * indicate a potential emergency and should be followed up as soon as possible or go to the Emergency Department if any problems should occur.  Please show the CHEMOTHERAPY ALERT CARD or IMMUNOTHERAPY ALERT  CARD at check-in to the Emergency Department and triage nurse.  Should you have questions after your visit or need to cancel or reschedule your appointment, please contact [facility]  Dept: [phone]  and follow the prompts.  Office hours are 8:00 a.m. to 4:30 p.m. Monday - Friday. Please note that voicemails left after 4:00 p.m. may not be returned until the following business day.  We are closed weekends and major holidays. You have access to a nurse at all times for urgent questions. Please call the main number to the clinic Dept: [phone] and follow the prompts.   For any non-urgent questions, you may also contact your provider using MyChart. We now offer e-Visits for anyone 18 and older to request care online for non-urgent symptoms. For details visit [email or web address].   Also download the MyChart app! Go to the app store, search "MyChart", open the app, select , and log in with your MyChart username and password.  Due to Covid, a mask is required upon entering the hospital/clinic. If you do not have a mask, one will be given to you upon arrival. For doctor visits, patients may have 1 support person aged 18 or older with them. For treatment visits, patients cannot have anyone with them due to current Covid guidelines and our immunocompromised population.   

## 2021-07-22 NOTE — Progress Notes (Signed)
Okay to treat with elevated creat per Dr. Lorenso Courier

## 2021-07-29 ENCOUNTER — Other Ambulatory Visit: Payer: Self-pay

## 2021-07-29 DIAGNOSIS — C9010 Plasma cell leukemia not having achieved remission: Secondary | ICD-10-CM

## 2021-07-29 MED ORDER — LENALIDOMIDE 10 MG PO CAPS
10.0000 mg | ORAL_CAPSULE | Freq: Every day | ORAL | 0 refills | Status: DC
Start: 2021-07-29 — End: 2021-08-01

## 2021-08-01 ENCOUNTER — Other Ambulatory Visit: Payer: Self-pay | Admitting: Pharmacist

## 2021-08-01 DIAGNOSIS — C9010 Plasma cell leukemia not having achieved remission: Secondary | ICD-10-CM

## 2021-08-01 MED ORDER — LENALIDOMIDE 10 MG PO CAPS: 10.0000 mg | ORAL_CAPSULE | Freq: Every day | ORAL | 0 refills | Status: DC

## 2021-08-01 NOTE — Progress Notes (Signed)
Oral Oncology Pharmacist Encounter  Revlimid (lenalidomide) is required to be filled through [facility]. Prescription redirected to [facility] for dispensing.  Leron Croak, PharmD, BCPS Hematology/Oncology Clinical Pharmacist [facility] [phone] 08/01/2021 8:46 AM

## 2021-08-05 ENCOUNTER — Other Ambulatory Visit: Payer: BC Managed Care – PPO

## 2021-08-05 ENCOUNTER — Inpatient Hospital Stay (HOSPITAL_BASED_OUTPATIENT_CLINIC_OR_DEPARTMENT_OTHER): Payer: BC Managed Care – PPO | Admitting: Hematology

## 2021-08-05 ENCOUNTER — Inpatient Hospital Stay: Payer: BC Managed Care – PPO

## 2021-08-05 ENCOUNTER — Ambulatory Visit: Payer: BC Managed Care – PPO

## 2021-08-05 ENCOUNTER — Other Ambulatory Visit: Payer: Self-pay

## 2021-08-05 VITALS — BP 134/79 | HR 74 | Temp 98.0°F | Resp 20 | Wt 155.2 lb

## 2021-08-05 DIAGNOSIS — C9010 Plasma cell leukemia not having achieved remission: Secondary | ICD-10-CM

## 2021-08-05 DIAGNOSIS — Z7189 Other specified counseling: Secondary | ICD-10-CM

## 2021-08-05 DIAGNOSIS — C9011 Plasma cell leukemia in remission: Secondary | ICD-10-CM

## 2021-08-05 DIAGNOSIS — C9031 Solitary plasmacytoma in remission: Secondary | ICD-10-CM | POA: Diagnosis not present

## 2021-08-05 DIAGNOSIS — Z5111 Encounter for antineoplastic chemotherapy: Secondary | ICD-10-CM

## 2021-08-05 LAB — CMP (CANCER CENTER ONLY)
ALT: 16 U/L (ref 0–44)
AST: 14 U/L — ABNORMAL LOW (ref 15–41)
Albumin: 3.8 g/dL (ref 3.5–5.0)
Alkaline Phosphatase: 71 U/L (ref 38–126)
Anion gap: 9 (ref 5–15)
BUN: 32 mg/dL — ABNORMAL HIGH (ref 8–23)
CO2: 19 mmol/L — ABNORMAL LOW (ref 22–32)
Calcium: 9.1 mg/dL (ref 8.9–10.3)
Chloride: 113 mmol/L — ABNORMAL HIGH (ref 98–111)
Creatinine: 1.77 mg/dL — ABNORMAL HIGH (ref 0.61–1.24)
GFR, Estimated: 42 mL/min — ABNORMAL LOW (ref >60)
Glucose, Bld: 115 mg/dL — ABNORMAL HIGH (ref 70–99)
Potassium: 4 mmol/L (ref 3.5–5.1)
Sodium: 141 mmol/L (ref 135–145)
Total Bilirubin: 0.6 mg/dL (ref 0.3–1.2)
Total Protein: 7 g/dL (ref 6.5–8.1)

## 2021-08-05 LAB — CBC WITH DIFFERENTIAL/PLATELET
Abs Immature Granulocytes: 0.01 10*3/uL (ref 0.00–0.07)
Basophils Absolute: 0.1 10*3/uL (ref 0.0–0.1)
Basophils Relative: 3 %
Eosinophils Absolute: 0.1 10*3/uL (ref 0.0–0.5)
Eosinophils Relative: 3 %
HCT: 33.5 % — ABNORMAL LOW (ref 39.0–52.0)
Hemoglobin: 11.9 g/dL — ABNORMAL LOW (ref 13.0–17.0)
Immature Granulocytes: 0 %
Lymphocytes Relative: 21 %
Lymphs Abs: 1 10*3/uL (ref 0.7–4.0)
MCH: 33.1 pg (ref 26.0–34.0)
MCHC: 35.5 g/dL (ref 30.0–36.0)
MCV: 93.3 fL (ref 80.0–100.0)
Monocytes Absolute: 0.7 10*3/uL (ref 0.1–1.0)
Monocytes Relative: 15 %
Neutro Abs: 2.8 10*3/uL (ref 1.7–7.7)
Neutrophils Relative %: 58 %
Platelets: 180 10*3/uL (ref 150–400)
RBC: 3.59 MIL/uL — ABNORMAL LOW (ref 4.22–5.81)
RDW: 15 % (ref 11.5–15.5)
WBC: 4.9 10*3/uL (ref 4.0–10.5)
nRBC: 0 % (ref 0.0–0.2)

## 2021-08-05 MED ORDER — VITAMIN D (ERGOCALCIFEROL) 1.25 MG (50000 UNIT) PO CAPS: 50000.0000 [IU] | ORAL_CAPSULE | ORAL | 6 refills | Status: DC

## 2021-08-05 MED ORDER — SODIUM CHLORIDE 0.9 % IV SOLN
20.0000 mg | Freq: Once | INTRAVENOUS | Status: AC
  Administered 2021-08-05: 20 mg via INTRAVENOUS
  Filled 2021-08-05: qty 20

## 2021-08-05 MED ORDER — ONDANSETRON HCL 4 MG PO TABS: 8.0000 mg | ORAL_TABLET | Freq: Three times a day (TID) | ORAL | 0 refills | Status: DC | PRN

## 2021-08-05 MED ORDER — ACETAMINOPHEN 325 MG PO TABS
650.0000 mg | ORAL_TABLET | Freq: Once | ORAL | Status: AC
  Administered 2021-08-05: 650 mg via ORAL
  Filled 2021-08-05: qty 2

## 2021-08-05 MED ORDER — SODIUM CHLORIDE 0.9 % IV SOLN
Freq: Once | INTRAVENOUS | Status: AC
Start: 2021-08-05 — End: 2021-08-05

## 2021-08-05 MED ORDER — SODIUM CHLORIDE 0.9 % IV SOLN: Freq: Once | INTRAVENOUS | Status: DC

## 2021-08-05 MED ORDER — SODIUM CHLORIDE 0.9 % IV SOLN: INTRAVENOUS | Status: AC

## 2021-08-05 MED ORDER — DEXTROSE 5 % IV SOLN
56.0000 mg/m2 | Freq: Once | INTRAVENOUS | Status: AC
  Administered 2021-08-05: 100 mg via INTRAVENOUS
  Filled 2021-08-05: qty 30

## 2021-08-05 MED ORDER — FAMOTIDINE 20 MG PO TABS
20.0000 mg | ORAL_TABLET | Freq: Once | ORAL | Status: AC
  Administered 2021-08-05: 20 mg via ORAL
  Filled 2021-08-05: qty 1

## 2021-08-05 MED ORDER — ACYCLOVIR 400 MG PO TABS
400.0000 mg | ORAL_TABLET | Freq: Every day | ORAL | 11 refills | Status: DC
Start: 2021-08-05 — End: 2022-09-11

## 2021-08-05 NOTE — Progress Notes (Signed)
OK to treat with creatnine of 1.77 per Dr. Irene Limbo

## 2021-08-05 NOTE — Addendum Note (Signed)
Addended by: Sullivan Lone on: 08/05/2021 11:16 AM   Modules accepted: Orders

## 2021-08-05 NOTE — Progress Notes (Signed)
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 His labs today 12/14/2017 show hgb 8.3, platelets 524k. He is doing well overall. He has staples in his right upper leg following a recent surgery and has not been in touch with the surgeons for a f/u of this. He is not receiving physical therapy and is not getting around at home.  He notes that he was not scheduled for a post hospitalization nephrology f/u and has no PCP.  On review of systems, pt reports intermittent fatigue, SOB, pain to the left leg, dizziness, dry mouth and denies fever, chills, night sweats, dysuria and any other accompanying symptoms.   INTERVAL HISTORY:   Andres Fernandez presents today for follow up and continue management of his myeloma/plasma cell leukemia. The patient's last visit with [name] was on 06/24/2021.   The pt reports that he has been doing well.  He recently received his Evusheld which he tolerated well. Is scheduled for his port placement on 08/19/2021. Discussed that he should get his flu shot and the new subvariant COVID-19 vaccination once available. No new infection issues. Has not been taking his acyclovir he was reminded to take this and new refills were sent.  Lab results today reviewed with the patient.  On review of systems, pt reports that acute symptoms.  Notes grade 1 through 2 fatigue especially after he has had a long day at work.  REVIEW OF SYSTEMS:   .10 Point review of Systems was done is negative except as noted above.   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   Tobacco abuse     SURGICAL HISTORY: Past  Surgical History:  Procedure Laterality Date   APPENDECTOMY     FEMUR IM NAIL Right 11/20/2017   Procedure: RIGHT FEMORAL INTRAMEDULLARY (IM) NAIL;  Surgeon: Nicholes Stairs, MD;  Location: [facility];  Service: Orthopedics;  Laterality: Right;    SOCIAL HISTORY: Social History   Socioeconomic History   Marital status: Single    Spouse name: Not on file   Number of children: Not on file   Years of education: Not on file   Highest education level: Not on file  Occupational History   Not on file  Tobacco Use   Smoking status: Every Day    Packs/day: 0.25    Types: Cigarettes    Last attempt to quit: 11/09/2017    Years since quitting: 3.7   Smokeless tobacco: Never   Tobacco comments:    1 cigarette/day as of 09/25/18  Vaping Use   Vaping Use: Never used  Substance and Sexual Activity   Alcohol use: Yes   Drug use: No   Sexual activity: Not on file  Other Topics Concern   Not on file  Social History Narrative   Not on file   Social Determinants of Health   Financial Resource Strain: Not on file  Food Insecurity: Not on file  Transportation Needs: Not on file  Physical Activity: Not on file  Stress: Not on file  Social Connections: Not on file  Intimate Partner Violence: Not on file    FAMILY HISTORY: Family  History  Problem Relation Age of Onset   COPD Mother    Liver cancer Mother     ALLERGIES:  has No Known Allergies.  MEDICATIONS:  . Current Outpatient Medications on File Prior to Visit  Medication Sig Dispense Refill   acyclovir (ZOVIRAX) 400 MG tablet Take 1 tablet (400 mg total) by mouth daily. (Patient not taking: Reported on 04/15/2021) 30 tablet 11   Albuterol Sulfate (PROAIR RESPICLICK) 812 (90 Base) MCG/ACT AEPB Inhale into the lungs.     aspirin EC 81 MG tablet Take 1 tablet (81 mg total) by mouth daily. (Patient taking differently: Take 81 mg by mouth at bedtime.) 60 tablet 11   cholecalciferol (VITAMIN D) 400 units TABS tablet Take 400  Units by mouth at bedtime.      lenalidomide (REVLIMID) 10 MG capsule Take 1 capsule (10 mg total) by mouth daily. Take for 21 days on, 7 days off, repeat every 28 days. 21 capsule 0   Multiple Vitamins-Minerals (MULTIVITAMIN WITH MINERALS) tablet Take 1 tablet by mouth at bedtime.      nicotine (NICODERM CQ - DOSED IN MG/24 HOURS) 14 mg/24hr patch PLACE 1 PATCH (14 MG TOTAL) ONTO THE SKIN DAILY. (Patient taking differently: Place 14 mg onto the skin daily.) 28 patch 1   ondansetron (ZOFRAN) 4 MG tablet Take 2 tablets (8 mg total) by mouth every 8 (eight) hours as needed for nausea. (Patient not taking: Reported on 04/15/2021) 30 tablet 0   tiZANidine (ZANAFLEX) 2 MG tablet Take 1 tablet (2 mg total) by mouth every 8 (eight) hours as needed for muscle spasms. (Patient not taking: Reported on 04/15/2021) 30 tablet 0   Vitamin D, Ergocalciferol, (DRISDOL) 1.25 MG (50000 UNIT) CAPS capsule Take 1 capsule (50,000 Units total) by mouth once a week. (Patient not taking: Reported on 04/15/2021) 12 capsule 6   No current facility-administered medications on file prior to visit.    PHYSICAL EXAMINATION:  ECOG FS:1 - Symptomatic but completely ambulatory  Vitals:   08/05/21 0915  BP: 134/79  Pulse: 74  Resp: 20  Temp: 98 F (36.7 C)  SpO2: 100%    Wt Readings from Last 3 Encounters:  08/05/21 155 lb 3.2 oz (70.4 kg)  07/22/21 153 lb 8 oz (69.6 kg)  06/24/21 151 lb 1.9 oz (68.5 kg)   Body mass index is 23.6 kg/m.    NAD . GENERAL:alert, in no acute distress and comfortable SKIN: no acute rashes, no significant lesions EYES: conjunctiva are pink and non-injected, sclera anicteric OROPHARYNX: MMM, no exudates, no oropharyngeal erythema or ulceration NECK: supple, no JVD LYMPH:  no palpable lymphadenopathy in the cervical, axillary or inguinal regions LUNGS: clear to auscultation b/l with normal respiratory effort HEART: regular rate & rhythm ABDOMEN:  normoactive bowel sounds , non tender, not  distended. Extremity: no pedal edema PSYCH: alert & oriented x 3 with fluent speech NEURO: no focal motor/sensory deficits   LABORATORY DATA:  I have reviewed the data as listed  CBC Latest Ref Rng & Units 07/22/2021 07/08/2021 06/24/2021  WBC 4.0 - 10.5 K/uL 6.7 5.3 6.2  Hemoglobin 13.0 - 17.0 g/dL 12.2(L) 11.9(L) 11.9(L)  Hematocrit 39.0 - 52.0 % 35.1(L) 33.9(L) 34.8(L)  Platelets 150 - 400 K/uL 181 211 174   CBC    Component Value Date/Time   WBC 6.7 07/22/2021 0901   RBC 3.69 (L) 07/22/2021 0901   HGB 12.2 (L) 07/22/2021 0901   HGB 11.9 (L) 07/08/2021 1009   HGB 8.3 (L)  12/14/2017 1230   HCT 35.1 (L) 07/22/2021 0901   HCT 23.9 (L) 12/14/2017 1230   PLT 181 07/22/2021 0901   PLT 211 07/08/2021 1009   PLT 524 (H) 12/14/2017 1230   MCV 95.1 07/22/2021 0901   MCV 88.4 12/14/2017 1230   MCH 33.1 07/22/2021 0901   MCHC 34.8 07/22/2021 0901   RDW 15.6 (H) 07/22/2021 0901   RDW 15.3 (H) 12/14/2017 1230   LYMPHSABS 1.8 07/22/2021 0901   LYMPHSABS 1.4 12/14/2017 1230   MONOABS 0.8 07/22/2021 0901   MONOABS 0.6 12/14/2017 1230   EOSABS 0.4 07/22/2021 0901   EOSABS 0.1 12/14/2017 1230   BASOSABS 0.1 07/22/2021 0901   BASOSABS 0.1 12/14/2017 1230     CMP Latest Ref Rng & Units 08/05/2021 07/22/2021 07/08/2021  Glucose 70 - 99 mg/dL 115(H) 100(H) 115(H)  BUN 8 - 23 mg/dL 32(H) 24(H) 34(H)  Creatinine 0.61 - 1.24 mg/dL 1.77(H) 1.74(H) 1.67(H)  Sodium 135 - 145 mmol/L 141 141 142  Potassium 3.5 - 5.1 mmol/L 4.0 4.2 4.3  Chloride 98 - 111 mmol/L 113(H) 112(H) 114(H)  CO2 22 - 32 mmol/L 19(L) 19(L) 19(L)  Calcium 8.9 - 10.3 mg/dL 9.1 8.8(L) 9.1  Total Protein 6.5 - 8.1 g/dL 7.0 6.8 6.8  Total Bilirubin 0.3 - 1.2 mg/dL 0.6 0.4 0.5  Alkaline Phos 38 - 126 U/L 71 65 62  AST 15 - 41 U/L 14(L) 11(L) 12(L)  ALT 0 - 44 U/L _0 03/09/20 ([id number]) Bone Marrow Biopsy at [facility]   07/02/2019 Bone Marrow Biopsy ([id number]) at [facility]    08/07/18 BM  Bx:        RADIOGRAPHIC STUDIES: I have personally reviewed the radiological images as listed and agreed with the findings in the report. No results found.  ASSESSMENT & PLAN:    65 y.o. 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   Initial BM Bx showed >95% involvement with kappa restricted serum free light chains. 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 MoL Cy-   08/07/18 BM Bx revealed normocellular bone marrow with trilineage hematopoiesis and 1% plasma cells which indicates the pt is in remission 08/06/18 PET/CT revealed  Expansile lytic lesion involving the left iliac bone, unchanged. Lytic lesion involving the sternum, unchanged. While both are suspicious for myeloma, neither lesion is FDG avid. No hypermetabolic osseous lesions in the visualized axial and appendicular skeleton. Please note that the bilateral lower extremities were not imaged. No suspicious lymphadenopathy. Spleen is normal in size.    Pt began Ninlaro 49m maintenance after pt finished 13 cycles of CyBorD and BM bx showed only 1% plasma cells, then resumed CyBorD and held Ninlaro in anticipation of his tandem bone marrow transplants [facility] in late December 2019 [facility]  11/19/18 High dose chemotherapy with Melphalan 1448mm2 and autologous stem cell rescue  03/20/19 High dose chemotherapy with Melphalan 20059m2 and autologous stem cell rescue. Non-infectious diarrhea, uncomplicated course otherwise.  07/02/2019 PET scan with results revealing "1. No FDG avid bone lesions are identified. 2.  Asymmetric uptake within the right, greater than left, palatine tonsils with mild fullness of the  right tonsillar soft tissue. ENT consult vs  attention on follow up suggested. 3.  Innumerable mixed sclerotic and lytic lesions throughout the axial and appendicular skeleton without associated hypermetabolic activity. 4.  Ancillary CT findings as above."  07/02/2019 bone marrow biopsy ([id number])with results showing: "Trilineage hematopoieses with maturation. No plasma cell myeloma identified. Mild anemia and No Rouleaux or circulating plasma cells identified in the peripheral blood."  2) s/p Prophylactic IM nailing for rt femur  completed [facility].  -patient previously reported improvement and is currently progressively back to full time work.  3) Thrombocytopenia resolved   4) Renal insufficiency -- likely primarily related to Myeloma. stable Plan -maintain follow up with nephrology referral to Dr Marval [facility] for continued treatment -Pt completed 8 weeks of maverick for hep c with Dr Linus Salmons on 07/08/18.  -Kidney numbers improved. Creatinine in the 1.7-2.2 range   5) h/o tobacco abuse-counseled on smoking cessation -Currently not smoking.   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 -Hgb improved/stable   -monitor  PLAN: -Discussed pt labwork today, 08/05/2021 -Tolerated Evusheld without any toxicities. -Advised pt to continue to wear mask and take all precautions possible. -Recommended pt continue to wear a mask and be cautious of social surroundings. -Recommended pt drink 48-64 oz water daily. -The pt has no prohibitive toxicities from continuing Carfilzomib every two weeks at this time. -Patient has not been taking acyclovir and was reminded to continue taking his renally dose adjusted acyclovir 400 mg p.o. daily every day new prescription with refills sent. -The pt has no prohibitive toxicities from continuing 10 mg Revlimid 3 weeks on, 1 week off at  this time. -Continue daily ASA and Vitamin D. -Continue Pamidronate q8weeks. -Will see back in 6 weeks with labs.   FOLLOW UP: Plz schedule next 8 doses of every 2 weekly carfilzomib with portflush and labs MD visit in 6 weeks -Continue Pamidronate q8weeks- plz schedule next 3 doses    The total time spent in the appointment was 20  minutes and more than 50% was on counseling and direct patient cares, ordering and mx of chemotherapy  All of the patient's questions were answered with apparent satisfaction. The patient knows to call the clinic with any problems, questions or concerns.  Sullivan Lone MD [place] AAHIVMS [facility] Hematology/Oncology Physician [facility]  (Office):       [phone] (Work cell):  [phone] (Fax):           [phone]

## 2021-08-05 NOTE — Patient Instructions (Signed)
[facility] MEDICAL ONCOLOGY  Discharge Instructions: Thank you for choosing [facility] to provide your oncology and hematology care.   If you have a lab appointment with the Cancer Center, please go directly to the Cancer Center and check in at the registration area.   Wear comfortable clothing and clothing appropriate for easy access to any Portacath or PICC line.   We strive to give you quality time with your provider. You may need to reschedule your appointment if you arrive late (15 or more minutes).  Arriving late affects you and other patients whose appointments are after yours.  Also, if you miss three or more appointments without notifying the office, you may be dismissed from the clinic at the provider's discretion.      For prescription refill requests, have your pharmacy contact our office and allow 72 hours for refills to be completed.    Today you received the following chemotherapy and/or immunotherapy agents Kyprolis      To help prevent nausea and vomiting after your treatment, we encourage you to take your nausea medication as directed.  BELOW ARE SYMPTOMS THAT SHOULD BE REPORTED IMMEDIATELY: *FEVER GREATER THAN 100.4 F (38 C) OR HIGHER *CHILLS OR SWEATING *NAUSEA AND VOMITING THAT IS NOT CONTROLLED WITH YOUR NAUSEA MEDICATION *UNUSUAL SHORTNESS OF BREATH *UNUSUAL BRUISING OR BLEEDING *URINARY PROBLEMS (pain or burning when urinating, or frequent urination) *BOWEL PROBLEMS (unusual diarrhea, constipation, pain near the anus) TENDERNESS IN MOUTH AND THROAT WITH OR WITHOUT PRESENCE OF ULCERS (sore throat, sores in mouth, or a toothache) UNUSUAL RASH, SWELLING OR PAIN  UNUSUAL VAGINAL DISCHARGE OR ITCHING   Items with * indicate a potential emergency and should be followed up as soon as possible or go to the Emergency Department if any problems should occur.  Please show the CHEMOTHERAPY ALERT CARD or IMMUNOTHERAPY ALERT CARD at check-in to  the Emergency Department and triage nurse.  Should you have questions after your visit or need to cancel or reschedule your appointment, please contact [facility] MEDICAL ONCOLOGY  Dept: [phone]  and follow the prompts.  Office hours are 8:00 a.m. to 4:30 p.m. Monday - Friday. Please note that voicemails left after 4:00 p.m. may not be returned until the following business day.  We are closed weekends and major holidays. You have access to a nurse at all times for urgent questions. Please call the main number to the clinic Dept: [phone] and follow the prompts.   For any non-urgent questions, you may also contact your provider using MyChart. We now offer e-Visits for anyone 18 and older to request care online for non-urgent symptoms. For details visit [email or web address].   Also download the MyChart app! Go to the app store, search "MyChart", open the app, select [facility], and log in with your MyChart username and password.  Due to Covid, a mask is required upon entering the hospital/clinic. If you do not have a mask, one will be given to you upon arrival. For doctor visits, patients may have 1 support person aged 18 or older with them. For treatment visits, patients cannot have anyone with them due to current Covid guidelines and our immunocompromised population.   Carfilzomib injection What is this medication? CARFILZOMIB (kar FILZ oh mib) targets a specific protein within cancer cellsand stops the cancer cells from growing. It is used to treat multiple myeloma. This medicine may be used for other purposes; ask your health care provider orpharmacist if you have questions.   COMMON BRAND NAME(S): KYPROLIS What should I tell my care team before I take this medication? They need to know if you have any of these conditions: heart disease history of blood clots irregular heartbeat kidney disease liver disease lung or breathing disease an unusual or allergic reaction  to carfilzomib, or other medicines, foods, dyes, or preservatives pregnant or trying to get pregnant breast-feeding How should I use this medication? This medicine is for injection or infusion into a vein. It is given by a healthcare professional in a hospital or clinic setting. Talk to your pediatrician regarding the use of this medicine in children.Special care may be needed. Overdosage: If you think you have taken too much of this medicine contact apoison control center or emergency room at once. NOTE: This medicine is only for you. Do not share this medicine with others. What if I miss a dose? It is important not to miss your dose. Call your doctor or health careprofessional if you are unable to keep an appointment. What may interact with this medication? Interactions are not expected. This list may not describe all possible interactions. Give your health care provider a list of all the medicines, herbs, non-prescription drugs, or dietary supplements you use. Also tell them if you smoke, drink alcohol, or use illegaldrugs. Some items may interact with your medicine. What should I watch for while using this medication? Your condition will be monitored while you are receiving this medicine. You may need blood work done while you are taking this medicine. Do not become pregnant while taking this medicine or for 6 months after stopping it. Women should inform their health care provider if they wish to become pregnant or think they might be pregnant. Men should not father a child while taking this medicine and for 3 months after stopping it. There is a potential for serious side effects to an unborn child. Talk to your health care provider for more information. Do not breast-feed an infant while taking thismedicine or for 2 weeks after stopping it. Check with your health care provider if you have severe diarrhea, nausea, and vomiting, or if you sweat a lot. The loss of too much body fluid may make  itdangerous for you to take this medicine. You may get drowsy or dizzy. Do not drive, use machinery, or do anything that needs mental alertness until you know how this medicine affects you. Do not stand up or sit up quickly, especially if you are an older patient. Thisreduces the risk of dizzy or fainting spells. What side effects may I notice from receiving this medication? Side effects that you should report to your doctor or health care professionalas soon as possible: allergic reactions like skin rash, itching or hives, swelling of the face, lips, or tongue confusion dizziness feeling faint or lightheaded fever or chills palpitations seizures signs and symptoms of bleeding such as bloody or black, tarry stools; red or dark-brown urine; spitting up blood or brown material that looks like coffee grounds; red spots on the skin; unusual bruising or bleeding including from the eye, gums, or nose signs and symptoms of a blood clot such as breathing problems; changes in vision; chest pain; severe, sudden headache; pain, swelling, warmth in the leg; trouble speaking; sudden numbness or weakness of the face, arm or leg signs and symptoms of kidney injury like trouble passing urine or change in the amount of urine signs and symptoms of liver injury like dark yellow or brown urine; general ill feeling or flu-like symptoms;   light-colored stools; loss of appetite; nausea; right upper belly pain; unusually weak or tired; yellowing of the eyes or skin Side effects that usually do not require medical attention (report to yourdoctor or health care professional if they continue or are bothersome): back pain cough diarrhea headache muscle cramps trouble sleeping vomiting This list may not describe all possible side effects. Call your doctor for medical advice about side effects. You may report side effects to FDA at1-800-FDA-1088. Where should I keep my medication? This drug is given in a hospital or  clinic and will not be stored at home. NOTE: This sheet is a summary. It may not cover all possible information. If you have questions about this medicine, talk to your doctor, pharmacist, orhealth care provider.  2022 Elsevier/Gold Standard (2020-12-17 15:24:55)   

## 2021-08-08 LAB — KAPPA/LAMBDA LIGHT CHAINS
Kappa free light chain: 25 mg/L — ABNORMAL HIGH (ref 3.3–19.4)
Kappa, lambda light chain ratio: 1.07 (ref 0.26–1.65)
Lambda free light chains: 23.4 mg/L (ref 5.7–26.3)

## 2021-08-09 ENCOUNTER — Telehealth: Payer: Self-pay | Admitting: Hematology

## 2021-08-09 NOTE — Telephone Encounter (Signed)
Left message with follow-up appointments per 8/26 los.

## 2021-08-18 ENCOUNTER — Other Ambulatory Visit: Payer: Self-pay | Admitting: Student

## 2021-08-18 MED FILL — Dexamethasone Sodium Phosphate Inj 100 MG/10ML: INTRAMUSCULAR | Qty: 2 | Status: AC

## 2021-08-19 ENCOUNTER — Ambulatory Visit (HOSPITAL_COMMUNITY)
Admission: RE | Admit: 2021-08-19 | Discharge: 2021-08-19 | Disposition: A | Discharge status: Home / Self Care | Payer: BC Managed Care – PPO | Source: Ambulatory Visit | Attending: Hematology | Admitting: Hematology

## 2021-08-19 ENCOUNTER — Encounter (HOSPITAL_COMMUNITY): Payer: Self-pay

## 2021-08-19 ENCOUNTER — Other Ambulatory Visit: Payer: Self-pay

## 2021-08-19 ENCOUNTER — Inpatient Hospital Stay: Payer: BC Managed Care – PPO | Attending: Hematology

## 2021-08-19 ENCOUNTER — Inpatient Hospital Stay: Payer: BC Managed Care – PPO

## 2021-08-19 ENCOUNTER — Other Ambulatory Visit: Payer: BC Managed Care – PPO

## 2021-08-19 VITALS — BP 124/80 | Temp 97.7°F | Resp 18 | Wt 153.8 lb

## 2021-08-19 DIAGNOSIS — C9011 Plasma cell leukemia in remission: Secondary | ICD-10-CM | POA: Diagnosis not present

## 2021-08-19 DIAGNOSIS — C9010 Plasma cell leukemia not having achieved remission: Secondary | ICD-10-CM

## 2021-08-19 DIAGNOSIS — Z79899 Other long term (current) drug therapy: Secondary | ICD-10-CM | POA: Insufficient documentation

## 2021-08-19 DIAGNOSIS — C9000 Multiple myeloma not having achieved remission: Secondary | ICD-10-CM | POA: Diagnosis not present

## 2021-08-19 DIAGNOSIS — D649 Anemia, unspecified: Secondary | ICD-10-CM | POA: Insufficient documentation

## 2021-08-19 DIAGNOSIS — Z5111 Encounter for antineoplastic chemotherapy: Secondary | ICD-10-CM | POA: Insufficient documentation

## 2021-08-19 DIAGNOSIS — C9001 Multiple myeloma in remission: Secondary | ICD-10-CM | POA: Diagnosis present

## 2021-08-19 DIAGNOSIS — Z7982 Long term (current) use of aspirin: Secondary | ICD-10-CM | POA: Insufficient documentation

## 2021-08-19 DIAGNOSIS — F17210 Nicotine dependence, cigarettes, uncomplicated: Secondary | ICD-10-CM | POA: Insufficient documentation

## 2021-08-19 DIAGNOSIS — Z7189 Other specified counseling: Secondary | ICD-10-CM

## 2021-08-19 HISTORY — PX: IR IMAGING GUIDED PORT INSERTION: IMG5740

## 2021-08-19 LAB — CMP (CANCER CENTER ONLY)
ALT: 12 U/L (ref 0–44)
AST: 11 U/L — ABNORMAL LOW (ref 15–41)
Albumin: 3.9 g/dL (ref 3.5–5.0)
Alkaline Phosphatase: 74 U/L (ref 38–126)
Anion gap: 12 (ref 5–15)
BUN: 22 mg/dL (ref 8–23)
CO2: 18 mmol/L — ABNORMAL LOW (ref 22–32)
Calcium: 9.1 mg/dL (ref 8.9–10.3)
Chloride: 111 mmol/L (ref 98–111)
Creatinine: 1.7 mg/dL — ABNORMAL HIGH (ref 0.61–1.24)
GFR, Estimated: 44 mL/min — ABNORMAL LOW (ref >60)
Glucose, Bld: 113 mg/dL — ABNORMAL HIGH (ref 70–99)
Potassium: 4 mmol/L (ref 3.5–5.1)
Sodium: 141 mmol/L (ref 135–145)
Total Bilirubin: 0.5 mg/dL (ref 0.3–1.2)
Total Protein: 7.2 g/dL (ref 6.5–8.1)

## 2021-08-19 LAB — CBC WITH DIFFERENTIAL/PLATELET
Abs Immature Granulocytes: 0.03 10*3/uL (ref 0.00–0.07)
Basophils Absolute: 0.1 10*3/uL (ref 0.0–0.1)
Basophils Relative: 1 %
Eosinophils Absolute: 0.2 10*3/uL (ref 0.0–0.5)
Eosinophils Relative: 3 %
HCT: 35.6 % — ABNORMAL LOW (ref 39.0–52.0)
Hemoglobin: 12.3 g/dL — ABNORMAL LOW (ref 13.0–17.0)
Immature Granulocytes: 0 %
Lymphocytes Relative: 24 %
Lymphs Abs: 1.9 10*3/uL (ref 0.7–4.0)
MCH: 32.6 pg (ref 26.0–34.0)
MCHC: 34.6 g/dL (ref 30.0–36.0)
MCV: 94.4 fL (ref 80.0–100.0)
Monocytes Absolute: 0.9 10*3/uL (ref 0.1–1.0)
Monocytes Relative: 11 %
Neutro Abs: 5 10*3/uL (ref 1.7–7.7)
Neutrophils Relative %: 61 %
Platelets: 214 10*3/uL (ref 150–400)
RBC: 3.77 MIL/uL — ABNORMAL LOW (ref 4.22–5.81)
RDW: 14.5 % (ref 11.5–15.5)
WBC: 8.1 10*3/uL (ref 4.0–10.5)
nRBC: 0 % (ref 0.0–0.2)

## 2021-08-19 MED ORDER — DEXTROSE 5 % IV SOLN
56.0000 mg/m2 | Freq: Once | INTRAVENOUS | Status: AC
  Administered 2021-08-19: 100 mg via INTRAVENOUS
  Filled 2021-08-19: qty 5

## 2021-08-19 MED ORDER — LIDOCAINE HCL 1 % IJ SOLN
INTRAMUSCULAR | Status: DC | PRN
  Administered 2021-08-19: 20 mL

## 2021-08-19 MED ORDER — SODIUM CHLORIDE 0.9 % IV SOLN: INTRAVENOUS | Status: DC

## 2021-08-19 MED ORDER — HEPARIN SOD (PORK) LOCK FLUSH 100 UNIT/ML IV SOLN
INTRAVENOUS | Status: DC | PRN
  Administered 2021-08-19: 500 [IU] via INTRAVENOUS

## 2021-08-19 MED ORDER — FENTANYL CITRATE (PF) 100 MCG/2ML IJ SOLN
INTRAMUSCULAR | Status: DC | PRN
  Administered 2021-08-19: 50 ug via INTRAVENOUS

## 2021-08-19 MED ORDER — MIDAZOLAM HCL 2 MG/2ML IJ SOLN
INTRAMUSCULAR | Status: AC
  Filled 2021-08-19: qty 4

## 2021-08-19 MED ORDER — MIDAZOLAM HCL 2 MG/2ML IJ SOLN
INTRAMUSCULAR | Status: DC | PRN
  Administered 2021-08-19: 1 mg via INTRAVENOUS

## 2021-08-19 MED ORDER — ACETAMINOPHEN 325 MG PO TABS
650.0000 mg | ORAL_TABLET | Freq: Once | ORAL | Status: AC
  Administered 2021-08-19: 650 mg via ORAL
  Filled 2021-08-19: qty 2

## 2021-08-19 MED ORDER — SODIUM CHLORIDE 0.9 % IV SOLN: Freq: Once | INTRAVENOUS | Status: AC

## 2021-08-19 MED ORDER — HEPARIN SOD (PORK) LOCK FLUSH 100 UNIT/ML IV SOLN
INTRAVENOUS | Status: AC
  Administered 2021-08-19: 5 [IU]
  Filled 2021-08-19: qty 5

## 2021-08-19 MED ORDER — LIDOCAINE HCL 1 % IJ SOLN
INTRAMUSCULAR | Status: AC
  Administered 2021-08-19: 12 mL via SUBCUTANEOUS
  Filled 2021-08-19: qty 20

## 2021-08-19 MED ORDER — FAMOTIDINE 20 MG PO TABS
20.0000 mg | ORAL_TABLET | Freq: Once | ORAL | Status: AC
  Administered 2021-08-19: 20 mg via ORAL
  Filled 2021-08-19: qty 1

## 2021-08-19 MED ORDER — FENTANYL CITRATE (PF) 100 MCG/2ML IJ SOLN
INTRAMUSCULAR | Status: AC
  Filled 2021-08-19: qty 2

## 2021-08-19 MED ORDER — SODIUM CHLORIDE 0.9 % IV SOLN
20.0000 mg | Freq: Once | INTRAVENOUS | Status: AC
  Administered 2021-08-19: 20 mg via INTRAVENOUS
  Filled 2021-08-19: qty 20

## 2021-08-19 NOTE — Procedures (Signed)
Interventional Radiology Procedure Note  Procedure: Single Lumen Power Port Placement    Access:  Right IJ vein.  Findings: Catheter tip positioned at SVC/RA junction. Port is ready for immediate use.   Complications: None  EBL: < 10 mL  Recommendations:  - Ok to shower in 24 hours - Do not submerge for 7 days - Routine line care   [name] T. Mandell Pangborn, M.D Pager:  [phone]   

## 2021-08-19 NOTE — Discharge Instructions (Signed)
Interventional radiology phone numbers °[phone] °After hours [phone] ° ° ° °You have skin glue (dermabond) over your new port. Do not use the lidocaine cream (EMLA cream) over the skin glue until it has healed. The petroleum in the lidocaine cream will dissolve the skin glue resulting in an infection of your new port. Use ice in a zip lock bag for 1-2 minutes over your new port before the cancer center nurses access your port. ° ° °Implanted Port Insertion, Care After °This sheet gives you information about how to care for yourself after your procedure. Your health care provider may also give you more specific instructions. If you have problems or questions, contact your health care provider. °What can I expect after the procedure? °After the procedure, it is common to have: °Discomfort at the port insertion site. °Bruising on the skin over the port. This should improve over 3-4 days. °Follow these instructions at home: °Port care °After your port is placed, you will get a manufacturer's information card. The card has information about your port. Keep this card with you at all times. °Take care of the port as told by your health care provider. Ask your health care provider if you or a family member can get training for taking care of the port at home. A home health care nurse may also take care of the port. °Make sure to remember what type of port you have. °Incision care °Follow instructions from your health care provider about how to take care of your port insertion site. Make sure you: °Wash your hands with soap and water before and after you change your bandage (dressing). If soap and water are not available, use hand sanitizer. °Change your dressing as told by your health care provider. °Leave skin glue in place. These skin closures may need to stay in place for 2 weeks or longer.  °Check your port insertion site every day for signs of infection. Check for: °Redness, swelling, or pain. °Fluid or  blood. °Warmth. °Pus or a bad smell.  °  °  °Activity °Return to your normal activities as told by your health care provider. Ask your health care provider what activities are safe for you. °Do not lift anything that is heavier than 10 lb (4.5 kg), or the limit that you are told, until your health care provider says that it is safe. °General instructions °Take over-the-counter and prescription medicines only as told by your health care provider. °Do not take baths, swim, or use a hot tub until your health care provider approves.You may remove your dressing tomorrow and shower 24 hours after your procedure. °Do not drive for 24 hours if you were given a sedative during your procedure. °Wear a medical alert bracelet in case of an emergency. This will tell any health care providers that you have a port. °Keep all follow-up visits as told by your health care provider. This is important. °Contact a health care provider if: °You cannot flush your port with saline as directed, or you cannot draw blood from the port. °You have a fever or chills. °You have redness, swelling, or pain around your port insertion site. °You have fluid or blood coming from your port insertion site. °Your port insertion site feels warm to the touch. °You have pus or a bad smell coming from the port insertion site. °Get help right away if: °You have chest pain or shortness of breath. °You have bleeding from your port that you cannot control. °Summary °Take care of   the port as told by your health care provider. Keep the manufacturer's information card with you at all times. °Change your dressing as told by your health care provider. °Contact a health care provider if you have a fever or chills or if you have redness, swelling, or pain around your port insertion site. °Keep all follow-up visits as told by your health care provider. °This information is not intended to replace advice given to you by your health care provider. Make sure you discuss any  questions you have with your health care provider. °Document Revised: 06/25/2018 Document Reviewed: 06/25/2018 °Elsevier Patient Education © 2021 Elsevier Inc. ° ° ° °Moderate Conscious Sedation, Adult, Care After °This sheet gives you information about how to care for yourself after your procedure. Your health care provider may also give you more specific instructions. If you have problems or questions, contact your health care provider. °What can I expect after the procedure? °After the procedure, it is common to have: °Sleepiness for several hours. °Impaired judgment for several hours. °Difficulty with balance. °Vomiting if you eat too soon. °Follow these instructions at home: °For the time period you were told by your health care provider: °Rest. °Do not participate in activities where you could fall or become injured. °Do not drive or use machinery. °Do not drink alcohol. °Do not take sleeping pills or medicines that cause drowsiness. °Do not make important decisions or sign legal documents. °Do not take care of children on your own.  °  °  °Eating and drinking °Follow the diet recommended by your health care provider. °Drink enough fluid to keep your urine pale yellow. °If you vomit: °Drink water, juice, or soup when you can drink without vomiting. °Make sure you have little or no nausea before eating solid foods.   °General instructions °Take over-the-counter and prescription medicines only as told by your health care provider. °Have a responsible adult stay with you for the time you are told. It is important to have someone help care for you until you are awake and alert. °Do not smoke. °Keep all follow-up visits as told by your health care provider. This is important. °Contact a health care provider if: °You are still sleepy or having trouble with balance after 24 hours. °You feel light-headed. °You keep feeling nauseous or you keep vomiting. °You develop a rash. °You have a fever. °You have redness or  swelling around the IV site. °Get help right away if: °You have trouble breathing. °You have new-onset confusion at home. °Summary °After the procedure, it is common to feel sleepy, have impaired judgment, or feel nauseous if you eat too soon. °Rest after you get home. Know the things you should not do after the procedure. °Follow the diet recommended by your health care provider and drink enough fluid to keep your urine pale yellow. °Get help right away if you have trouble breathing or new-onset confusion at home. °This information is not intended to replace advice given to you by your health care provider. Make sure you discuss any questions you have with your health care provider. °Document Revised: 03/26/2020 Document Reviewed: 10/23/2019 °Elsevier Patient Education © 2021 Elsevier Inc.  °

## 2021-08-19 NOTE — Patient Instructions (Signed)
[facility]  Discharge Instructions: Thank you for choosing [facility] to provide your oncology and hematology care.   If you have a lab appointment with the Cancer Center, please go directly to the Cancer Center and check in at the registration area.   Wear comfortable clothing and clothing appropriate for easy access to any Portacath or PICC line.   We strive to give you quality time with your provider. You may need to reschedule your appointment if you arrive late (15 or more minutes).  Arriving late affects you and other patients whose appointments are after yours.  Also, if you miss three or more appointments without notifying the office, you may be dismissed from the clinic at the provider's discretion.      For prescription refill requests, have your pharmacy contact our office and allow 72 hours for refills to be completed.    Today you received the following chemotherapy and/or immunotherapy agents: Kyprolis    To help prevent nausea and vomiting after your treatment, we encourage you to take your nausea medication as directed.  BELOW ARE SYMPTOMS THAT SHOULD BE REPORTED IMMEDIATELY: . *FEVER GREATER THAN 100.4 F (38 C) OR HIGHER . *CHILLS OR SWEATING . *NAUSEA AND VOMITING THAT IS NOT CONTROLLED WITH YOUR NAUSEA MEDICATION . *UNUSUAL SHORTNESS OF BREATH . *UNUSUAL BRUISING OR BLEEDING . *URINARY PROBLEMS (pain or burning when urinating, or frequent urination) . *BOWEL PROBLEMS (unusual diarrhea, constipation, pain near the anus) . TENDERNESS IN MOUTH AND THROAT WITH OR WITHOUT PRESENCE OF ULCERS (sore throat, sores in mouth, or a toothache) . UNUSUAL RASH, SWELLING OR PAIN  . UNUSUAL VAGINAL DISCHARGE OR ITCHING   Items with * indicate a potential emergency and should be followed up as soon as possible or go to the Emergency Department if any problems should occur.  Please show the CHEMOTHERAPY ALERT CARD or IMMUNOTHERAPY ALERT  CARD at check-in to the Emergency Department and triage nurse.  Should you have questions after your visit or need to cancel or reschedule your appointment, please contact [facility]  Dept: [phone]  and follow the prompts.  Office hours are 8:00 a.m. to 4:30 p.m. Monday - Friday. Please note that voicemails left after 4:00 p.m. may not be returned until the following business day.  We are closed weekends and major holidays. You have access to a nurse at all times for urgent questions. Please call the main number to the clinic Dept: [phone] and follow the prompts.   For any non-urgent questions, you may also contact your provider using MyChart. We now offer e-Visits for anyone 18 and older to request care online for non-urgent symptoms. For details visit [email or web address].   Also download the MyChart app! Go to the app store, search "MyChart", open the app, select [facility], and log in with your MyChart username and password.  Due to Covid, a mask is required upon entering the hospital/clinic. If you do not have a mask, one will be given to you upon arrival. For doctor visits, patients may have 1 support person aged 18 or older with them. For treatment visits, patients cannot have anyone with them due to current Covid guidelines and our immunocompromised population.   

## 2021-08-19 NOTE — Progress Notes (Signed)
Iv left in place for short stay this afternoon, patient to have portacath placement

## 2021-08-19 NOTE — Progress Notes (Signed)
Dr. Irene Limbo aware of stable elevated creatinine, okay to proceed with tx as scheduled.

## 2021-08-19 NOTE — H&P (Signed)
Chief Complaint: Patient was seen in consultation today for port placement at the request of Andres Fernandez  Referring Physician(s): Andres Fernandez  Supervising Physician: Andres Fernandez  Patient Status: [name] - Out-pt  History of Present Illness: Andres Fernandez is a 65 y.o. male with myeloma. He is referred for port placement for therapy. PMHx, meds, labs, imaging, allergies reviewed. Feels well, no recent fevers, chills, illness. Has been NPO today as directed.   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  Medications: Prior to Admission medications   Medication Sig Start Date End Date Taking? Authorizing Provider  aspirin EC 81 MG tablet Take 1 tablet (81 mg total) by mouth daily. Patient taking differently: Take 81 mg by mouth at bedtime. 11/28/19  Yes Andres Genera, MD  cholecalciferol (VITAMIN D) 400 units TABS tablet Take 400 Units by mouth at bedtime.    Yes [provider]  lenalidomide (REVLIMID) 10 MG capsule Take 1 capsule (10 mg total) by mouth daily. Take for 21 days on, 7 days off, repeat every 28 days. 08/01/21  Yes Andres Genera, MD  Multiple Vitamins-Minerals (MULTIVITAMIN WITH MINERALS) tablet Take 1 tablet by mouth at bedtime.    Yes [provider]  nicotine (NICODERM CQ - DOSED IN MG/24 HOURS) 14 mg/24hr patch PLACE 1 PATCH (14 MG TOTAL) ONTO THE SKIN DAILY. Patient taking differently: Place 14 mg onto the skin daily. 07/01/20  Yes Andres Genera, MD  Vitamin D,  Ergocalciferol, (DRISDOL) 1.25 MG (50000 UNIT) CAPS capsule Take 1 capsule (50,000 Units total) by mouth once a week. 08/05/21  Yes Andres Genera, MD  acyclovir (ZOVIRAX) 400 MG tablet Take 1 tablet (400 mg total) by mouth daily. 08/05/21   Andres Genera, MD  Albuterol Sulfate (PROAIR [place]) [id number] (90 Base) MCG/ACT AEPB Inhale into the lungs.    [provider]  ondansetron (ZOFRAN) 4 MG tablet Take 2 tablets (8 mg total) by mouth every 8 (eight) hours as needed for nausea. 08/05/21   Andres Genera, MD     Family History  Problem Relation Age of Onset   COPD Mother    Liver cancer Mother     Social History   Socioeconomic History   Marital status: Single    Spouse name: Not on file   Number of children: Not on file   Years of education: Not on file   Highest education level: Not on file  Occupational History   Not on file  Tobacco Use   Smoking status: Every Day    Packs/day: 0.25    Types: Cigarettes    Last attempt to quit: 11/09/2017    Years since quitting: 3.7   Smokeless tobacco: Never   Tobacco comments:    1 cigarette/day as of 09/25/18  Vaping Use   Vaping Use: Never used  Substance and Sexual Activity   Alcohol use: Yes   Drug use: No   Sexual activity: Not on file  Other Topics Concern   Not on  file  Social History Narrative   Not on file   Social Determinants of Health   Financial Resource Strain: Not on file  Food Insecurity: Not on file  Transportation Needs: Not on file  Physical Activity: Not on file  Stress: Not on file  Social Connections: Not on file    Review of Systems: A 12 point ROS discussed and pertinent positives are indicated in the HPI above.  All other systems are negative.  Review of Systems  Vital Signs: There were no vitals taken for this visit.  Physical Exam Constitutional:      Appearance: Normal appearance.  HENT:     Mouth/Throat:     Mouth: Mucous membranes are moist.     Pharynx:  Oropharynx is clear.  Cardiovascular:     Rate and Rhythm: Normal rate and regular rhythm.     Heart sounds: Normal heart sounds.  Pulmonary:     Effort: Pulmonary effort is normal. No respiratory distress.     Breath sounds: Normal breath sounds.  Abdominal:     General: Abdomen is flat.     Palpations: Abdomen is soft.  Skin:    General: Skin is warm and dry.  Neurological:     General: No focal deficit present.     Mental Status: He is alert and oriented to person, place, and time.  Psychiatric:        Mood and Affect: Mood normal.        Thought Content: Thought content normal.        Judgment: Judgment normal.    Imaging: No results found.  Labs:  CBC: Recent Labs    07/08/21 1009 07/22/21 0901 08/05/21 1035 08/19/21 0917  WBC 5.3 6.7 4.9 8.1  HGB 11.9* 12.2* 11.9* 12.3*  HCT 33.9* 35.1* 33.5* 35.6*  PLT 211 181 180 214    COAGS: No results for input(s): INR, APTT in the last 8760 hours.  BMP: Recent Labs    08/20/20 0818 09/03/20 1016 09/17/20 1129 07/08/21 0932 07/22/21 0901 08/05/21 0903 08/19/21 0917  NA 143 143   < > 142 141 141 141  K 4.4 4.1   < > 4.3 4.2 4.0 4.0  CL 113* 111   < > 114* 112* 113* 111  CO2 18* 24   < > 19* 19* 19* 18*  GLUCOSE 113* 87   < > 115* 100* 115* 113*  BUN 35* 23   < > 34* 24* 32* 22  CALCIUM 9.5 8.7*   < > 9.1 8.8* 9.1 9.1  CREATININE 1.78* 1.58*   < > 1.67* 1.74* 1.77* 1.70*  GFRNONAA 40* 46*   < > 45* 43* 42* 44*  GFRAA 46* 53*  --   --   --   --   --    < > = values in this interval not displayed.    LIVER FUNCTION TESTS: Recent Labs    07/08/21 0932 07/22/21 0901 08/05/21 0903 08/19/21 0917  BILITOT 0.5 0.4 0.6 0.5  AST 12* 11* 14* 11*  ALT '13 16 16 12  '$ ALKPHOS 62 65 71 74  PROT 6.8 6.8 7.0 7.2  ALBUMIN 3.8 3.8 3.8 3.9    TUMOR MARKERS: No results for input(s): AFPTM, CEA, CA199, CHROMGRNA in the last 8760 hours.  Assessment and Plan: Myeloma For port placement Risks and benefits of image  guided port-a-catheter placement was discussed with the patient including, but not limited to bleeding, infection, pneumothorax, or fibrin sheath development and need  for additional procedures.  All of the patient's questions were answered, patient is agreeable to proceed. Consent signed and in chart.   Thank you for this interesting consult.  I greatly enjoyed meeting Andres Fernandez and look forward to participating in their care.  A copy of this report was sent to the requesting provider on this date.  Electronically Signed: Ascencion Dike, PA-C 08/19/2021, 1:36 PM   I spent a total of 20 minutes in face to face in clinical consultation, greater than 50% of which was counseling/coordinating care for port placement

## 2021-08-30 ENCOUNTER — Other Ambulatory Visit: Payer: Self-pay

## 2021-08-30 DIAGNOSIS — C9010 Plasma cell leukemia not having achieved remission: Secondary | ICD-10-CM

## 2021-08-30 MED ORDER — LENALIDOMIDE 10 MG PO CAPS
10.0000 mg | ORAL_CAPSULE | Freq: Every day | ORAL | 0 refills | Status: DC
Start: 2021-08-30 — End: 2021-09-27

## 2021-09-02 ENCOUNTER — Other Ambulatory Visit: Payer: Self-pay | Admitting: Hematology

## 2021-09-02 ENCOUNTER — Inpatient Hospital Stay: Payer: BC Managed Care – PPO

## 2021-09-02 ENCOUNTER — Other Ambulatory Visit: Payer: Self-pay

## 2021-09-02 VITALS — BP 128/88 | HR 78 | Temp 97.5°F | Resp 18

## 2021-09-02 DIAGNOSIS — Z5111 Encounter for antineoplastic chemotherapy: Secondary | ICD-10-CM | POA: Diagnosis not present

## 2021-09-02 DIAGNOSIS — M84551A Pathological fracture in neoplastic disease, right femur, initial encounter for fracture: Secondary | ICD-10-CM

## 2021-09-02 DIAGNOSIS — Z95828 Presence of other vascular implants and grafts: Secondary | ICD-10-CM

## 2021-09-02 DIAGNOSIS — Z7189 Other specified counseling: Secondary | ICD-10-CM

## 2021-09-02 DIAGNOSIS — C9010 Plasma cell leukemia not having achieved remission: Secondary | ICD-10-CM

## 2021-09-02 LAB — CMP (CANCER CENTER ONLY)
ALT: 12 U/L (ref 0–44)
AST: 13 U/L — ABNORMAL LOW (ref 15–41)
Albumin: 3.8 g/dL (ref 3.5–5.0)
Alkaline Phosphatase: 68 U/L (ref 38–126)
Anion gap: 11 (ref 5–15)
BUN: 27 mg/dL — ABNORMAL HIGH (ref 8–23)
CO2: 21 mmol/L — ABNORMAL LOW (ref 22–32)
Calcium: 9 mg/dL (ref 8.9–10.3)
Chloride: 109 mmol/L (ref 98–111)
Creatinine: 1.69 mg/dL — ABNORMAL HIGH (ref 0.61–1.24)
GFR, Estimated: 45 mL/min — ABNORMAL LOW (ref >60)
Glucose, Bld: 107 mg/dL — ABNORMAL HIGH (ref 70–99)
Potassium: 4.1 mmol/L (ref 3.5–5.1)
Sodium: 141 mmol/L (ref 135–145)
Total Bilirubin: 0.5 mg/dL (ref 0.3–1.2)
Total Protein: 6.9 g/dL (ref 6.5–8.1)

## 2021-09-02 LAB — CBC WITH DIFFERENTIAL/PLATELET
Abs Immature Granulocytes: 0.03 10*3/uL (ref 0.00–0.07)
Basophils Absolute: 0.1 10*3/uL (ref 0.0–0.1)
Basophils Relative: 1 %
Eosinophils Absolute: 0.3 10*3/uL (ref 0.0–0.5)
Eosinophils Relative: 5 %
HCT: 34 % — ABNORMAL LOW (ref 39.0–52.0)
Hemoglobin: 11.8 g/dL — ABNORMAL LOW (ref 13.0–17.0)
Immature Granulocytes: 1 %
Lymphocytes Relative: 22 %
Lymphs Abs: 1.2 10*3/uL (ref 0.7–4.0)
MCH: 33.1 pg (ref 26.0–34.0)
MCHC: 34.7 g/dL (ref 30.0–36.0)
MCV: 95.5 fL (ref 80.0–100.0)
Monocytes Absolute: 0.6 10*3/uL (ref 0.1–1.0)
Monocytes Relative: 12 %
Neutro Abs: 3.2 10*3/uL (ref 1.7–7.7)
Neutrophils Relative %: 59 %
Platelets: 195 10*3/uL (ref 150–400)
RBC: 3.56 MIL/uL — ABNORMAL LOW (ref 4.22–5.81)
RDW: 14.8 % (ref 11.5–15.5)
WBC: 5.4 10*3/uL (ref 4.0–10.5)
nRBC: 0 % (ref 0.0–0.2)

## 2021-09-02 MED ORDER — SODIUM CHLORIDE 0.9% FLUSH
10.0000 mL | INTRAVENOUS | Status: AC | PRN
  Administered 2021-09-02: 10 mL

## 2021-09-02 MED ORDER — HEPARIN SOD (PORK) LOCK FLUSH 100 UNIT/ML IV SOLN
500.0000 [IU] | Freq: Once | INTRAVENOUS | Status: AC | PRN
Start: 2021-09-02 — End: 2021-09-02
  Administered 2021-09-02: 500 [IU]

## 2021-09-02 MED ORDER — DEXTROSE 5 % IV SOLN
56.0000 mg/m2 | Freq: Once | INTRAVENOUS | Status: AC
  Administered 2021-09-02: 100 mg via INTRAVENOUS
  Filled 2021-09-02: qty 5

## 2021-09-02 MED ORDER — SODIUM CHLORIDE 0.9 % IV SOLN: Freq: Once | INTRAVENOUS | Status: AC

## 2021-09-02 MED ORDER — SODIUM CHLORIDE 0.9 % IV SOLN: INTRAVENOUS | Status: AC

## 2021-09-02 MED ORDER — SODIUM CHLORIDE 0.9% FLUSH
10.0000 mL | INTRAVENOUS | Status: DC | PRN
  Administered 2021-09-02: 10 mL

## 2021-09-02 MED ORDER — FAMOTIDINE 20 MG PO TABS
20.0000 mg | ORAL_TABLET | Freq: Once | ORAL | Status: AC
  Administered 2021-09-02: 20 mg via ORAL
  Filled 2021-09-02: qty 1

## 2021-09-02 MED ORDER — SODIUM CHLORIDE 0.9 % IV SOLN
20.0000 mg | Freq: Once | INTRAVENOUS | Status: AC
  Administered 2021-09-02: 20 mg via INTRAVENOUS
  Filled 2021-09-02: qty 20

## 2021-09-02 MED ORDER — SODIUM CHLORIDE 0.9 % IV SOLN: 60.0000 mg | Freq: Once | INTRAVENOUS | Status: DC

## 2021-09-02 MED ORDER — ACETAMINOPHEN 325 MG PO TABS
650.0000 mg | ORAL_TABLET | Freq: Once | ORAL | Status: AC
  Administered 2021-09-02: 650 mg via ORAL
  Filled 2021-09-02: qty 2

## 2021-09-02 NOTE — Patient Instructions (Addendum)
[facility]   Discharge Instructions: Thank you for choosing [facility] to provide your oncology and hematology care.   If you have a lab appointment with the Cancer Center, please go directly to the Cancer Center and check in at the registration area.   Wear comfortable clothing and clothing appropriate for easy access to any Portacath or PICC line.   We strive to give you quality time with your provider. You may need to reschedule your appointment if you arrive late (15 or more minutes).  Arriving late affects you and other patients whose appointments are after yours.  Also, if you miss three or more appointments without notifying the office, you may be dismissed from the clinic at the provider's discretion.      For prescription refill requests, have your pharmacy contact our office and allow 72 hours for refills to be completed.    Today you received the following chemotherapy and/or immunotherapy agents: Carfilzomib (Kyprolis)     To help prevent nausea and vomiting after your treatment, we encourage you to take your nausea medication as directed.  BELOW ARE SYMPTOMS THAT SHOULD BE REPORTED IMMEDIATELY: *FEVER GREATER THAN 100.4 F (38 C) OR HIGHER *CHILLS OR SWEATING *NAUSEA AND VOMITING THAT IS NOT CONTROLLED WITH YOUR NAUSEA MEDICATION *UNUSUAL SHORTNESS OF BREATH *UNUSUAL BRUISING OR BLEEDING *URINARY PROBLEMS (pain or burning when urinating, or frequent urination) *BOWEL PROBLEMS (unusual diarrhea, constipation, pain near the anus) TENDERNESS IN MOUTH AND THROAT WITH OR WITHOUT PRESENCE OF ULCERS (sore throat, sores in mouth, or a toothache) UNUSUAL RASH, SWELLING OR PAIN  UNUSUAL VAGINAL DISCHARGE OR ITCHING   Items with * indicate a potential emergency and should be followed up as soon as possible or go to the Emergency Department if any problems should occur.  Please show the CHEMOTHERAPY ALERT CARD or IMMUNOTHERAPY ALERT CARD  at check-in to the Emergency Department and triage nurse.  Should you have questions after your visit or need to cancel or reschedule your appointment, please contact [facility]  Dept: [phone]  and follow the prompts.  Office hours are 8:00 a.m. to 4:30 p.m. Monday - Friday. Please note that voicemails left after 4:00 p.m. may not be returned until the following business day.  We are closed weekends and major holidays. You have access to a nurse at all times for urgent questions. Please call the main number to the clinic Dept: [phone] and follow the prompts.   For any non-urgent questions, you may also contact your provider using MyChart. We now offer e-Visits for anyone 18 and older to request care online for non-urgent symptoms. For details visit [email or web address].   Also download the MyChart app! Go to the app store, search "MyChart", open the app, select [facility], and log in with your MyChart username and password.  Due to Covid, a mask is required upon entering the hospital/clinic. If you do not have a mask, one will be given to you upon arrival. For doctor visits, patients may have 1 support person aged 18 or older with them. For treatment visits, patients cannot have anyone with them due to current Covid guidelines and our immunocompromised population.  

## 2021-09-02 NOTE — Progress Notes (Signed)
Per Dr. Irene Limbo - okay to proceed with elevated creatinine levels.

## 2021-09-02 NOTE — Progress Notes (Signed)
Patient states he was unaware of appointment duration today and will not be able to stay for his Aredia infusion. Charge nurse and pharmacy made aware. Patient is to have Aredia added to next treatment day on 10/7. Patient made aware and agreeable to treatment plan.

## 2021-09-05 LAB — KAPPA/LAMBDA LIGHT CHAINS
Kappa free light chain: 27.9 mg/L — ABNORMAL HIGH (ref 3.3–19.4)
Kappa, lambda light chain ratio: 1.12 (ref 0.26–1.65)
Lambda free light chains: 24.9 mg/L (ref 5.7–26.3)

## 2021-09-06 LAB — MULTIPLE MYELOMA PANEL, SERUM
Albumin SerPl Elph-Mcnc: 3.8 g/dL (ref 2.9–4.4)
Albumin/Glob SerPl: 1.4 (ref 0.7–1.7)
Alpha 1: 0.2 g/dL (ref 0.0–0.4)
Alpha2 Glob SerPl Elph-Mcnc: 1.1 g/dL — ABNORMAL HIGH (ref 0.4–1.0)
B-Globulin SerPl Elph-Mcnc: 0.8 g/dL (ref 0.7–1.3)
Gamma Glob SerPl Elph-Mcnc: 0.7 g/dL (ref 0.4–1.8)
Globulin, Total: 2.9 g/dL (ref 2.2–3.9)
IgA: 98 mg/dL (ref 61–437)
IgG (Immunoglobin G), Serum: 720 mg/dL (ref 603–1613)
IgM (Immunoglobulin M), Srm: 13 mg/dL — ABNORMAL LOW (ref 20–172)
Total Protein ELP: 6.7 g/dL (ref 6.0–8.5)

## 2021-09-15 MED FILL — Dexamethasone Sodium Phosphate Inj 100 MG/10ML: INTRAMUSCULAR | Qty: 2 | Status: AC

## 2021-09-16 ENCOUNTER — Other Ambulatory Visit: Payer: Self-pay

## 2021-09-16 ENCOUNTER — Inpatient Hospital Stay: Payer: BC Managed Care – PPO

## 2021-09-16 ENCOUNTER — Telehealth: Payer: Self-pay

## 2021-09-16 ENCOUNTER — Inpatient Hospital Stay: Payer: BC Managed Care – PPO | Attending: Hematology

## 2021-09-16 VITALS — BP 129/77 | HR 92 | Temp 98.2°F | Resp 18 | Wt 157.5 lb

## 2021-09-16 DIAGNOSIS — Z9221 Personal history of antineoplastic chemotherapy: Secondary | ICD-10-CM | POA: Insufficient documentation

## 2021-09-16 DIAGNOSIS — Z5111 Encounter for antineoplastic chemotherapy: Secondary | ICD-10-CM | POA: Diagnosis present

## 2021-09-16 DIAGNOSIS — C9001 Multiple myeloma in remission: Secondary | ICD-10-CM | POA: Insufficient documentation

## 2021-09-16 DIAGNOSIS — C9010 Plasma cell leukemia not having achieved remission: Secondary | ICD-10-CM

## 2021-09-16 DIAGNOSIS — Z79899 Other long term (current) drug therapy: Secondary | ICD-10-CM | POA: Insufficient documentation

## 2021-09-16 DIAGNOSIS — Z7189 Other specified counseling: Secondary | ICD-10-CM

## 2021-09-16 DIAGNOSIS — N19 Unspecified kidney failure: Secondary | ICD-10-CM | POA: Insufficient documentation

## 2021-09-16 DIAGNOSIS — Z7982 Long term (current) use of aspirin: Secondary | ICD-10-CM | POA: Diagnosis not present

## 2021-09-16 DIAGNOSIS — M84551A Pathological fracture in neoplastic disease, right femur, initial encounter for fracture: Secondary | ICD-10-CM

## 2021-09-16 DIAGNOSIS — F17210 Nicotine dependence, cigarettes, uncomplicated: Secondary | ICD-10-CM | POA: Diagnosis not present

## 2021-09-16 DIAGNOSIS — D649 Anemia, unspecified: Secondary | ICD-10-CM | POA: Insufficient documentation

## 2021-09-16 LAB — CMP (CANCER CENTER ONLY)
ALT: 12 U/L (ref 0–44)
AST: 11 U/L — ABNORMAL LOW (ref 15–41)
Albumin: 3.9 g/dL (ref 3.5–5.0)
Alkaline Phosphatase: 75 U/L (ref 38–126)
Anion gap: 9 (ref 5–15)
BUN: 24 mg/dL — ABNORMAL HIGH (ref 8–23)
CO2: 22 mmol/L (ref 22–32)
Calcium: 9.1 mg/dL (ref 8.9–10.3)
Chloride: 109 mmol/L (ref 98–111)
Creatinine: 1.72 mg/dL — ABNORMAL HIGH (ref 0.61–1.24)
GFR, Estimated: 44 mL/min — ABNORMAL LOW (ref >60)
Glucose, Bld: 124 mg/dL — ABNORMAL HIGH (ref 70–99)
Potassium: 3.8 mmol/L (ref 3.5–5.1)
Sodium: 140 mmol/L (ref 135–145)
Total Bilirubin: 0.6 mg/dL (ref 0.3–1.2)
Total Protein: 7.2 g/dL (ref 6.5–8.1)

## 2021-09-16 LAB — CBC WITH DIFFERENTIAL/PLATELET
Abs Immature Granulocytes: 0.02 10*3/uL (ref 0.00–0.07)
Basophils Absolute: 0.1 10*3/uL (ref 0.0–0.1)
Basophils Relative: 1 %
Eosinophils Absolute: 0.2 10*3/uL (ref 0.0–0.5)
Eosinophils Relative: 2 %
HCT: 34.8 % — ABNORMAL LOW (ref 39.0–52.0)
Hemoglobin: 12 g/dL — ABNORMAL LOW (ref 13.0–17.0)
Immature Granulocytes: 0 %
Lymphocytes Relative: 19 %
Lymphs Abs: 1.5 10*3/uL (ref 0.7–4.0)
MCH: 33 pg (ref 26.0–34.0)
MCHC: 34.5 g/dL (ref 30.0–36.0)
MCV: 95.6 fL (ref 80.0–100.0)
Monocytes Absolute: 0.4 10*3/uL (ref 0.1–1.0)
Monocytes Relative: 5 %
Neutro Abs: 5.8 10*3/uL (ref 1.7–7.7)
Neutrophils Relative %: 73 %
Platelets: 182 10*3/uL (ref 150–400)
RBC: 3.64 MIL/uL — ABNORMAL LOW (ref 4.22–5.81)
RDW: 14.7 % (ref 11.5–15.5)
WBC: 8 10*3/uL (ref 4.0–10.5)
nRBC: 0 % (ref 0.0–0.2)

## 2021-09-16 MED ORDER — SODIUM CHLORIDE 0.9 % IV SOLN: Freq: Once | INTRAVENOUS | Status: AC

## 2021-09-16 MED ORDER — DEXTROSE 5 % IV SOLN
56.0000 mg/m2 | Freq: Once | INTRAVENOUS | Status: AC
  Administered 2021-09-16: 100 mg via INTRAVENOUS
  Filled 2021-09-16: qty 30

## 2021-09-16 MED ORDER — ACETAMINOPHEN 325 MG PO TABS
650.0000 mg | ORAL_TABLET | Freq: Once | ORAL | Status: AC
  Administered 2021-09-16: 650 mg via ORAL
  Filled 2021-09-16: qty 2

## 2021-09-16 MED ORDER — SODIUM CHLORIDE 0.9 % IV SOLN: INTRAVENOUS | Status: AC

## 2021-09-16 MED ORDER — SODIUM CHLORIDE 0.9% FLUSH
10.0000 mL | Freq: Once | INTRAVENOUS | Status: AC | PRN
  Administered 2021-09-16: 10 mL

## 2021-09-16 MED ORDER — SODIUM CHLORIDE 0.9 % IV SOLN: Freq: Once | INTRAVENOUS | Status: DC

## 2021-09-16 MED ORDER — LIDOCAINE-PRILOCAINE 2.5-2.5 % EX CREA: 1.0000 "application " | TOPICAL_CREAM | CUTANEOUS | 0 refills | Status: DC | PRN

## 2021-09-16 MED ORDER — HEPARIN SOD (PORK) LOCK FLUSH 100 UNIT/ML IV SOLN
500.0000 [IU] | Freq: Once | INTRAVENOUS | Status: AC | PRN
  Administered 2021-09-16: 500 [IU]

## 2021-09-16 MED ORDER — DEXAMETHASONE SODIUM PHOSPHATE 100 MG/10ML IJ SOLN
20.0000 mg | Freq: Once | INTRAMUSCULAR | Status: AC
  Administered 2021-09-16: 20 mg via INTRAVENOUS
  Filled 2021-09-16: qty 20

## 2021-09-16 MED ORDER — SODIUM CHLORIDE 0.9% FLUSH
10.0000 mL | INTRAVENOUS | Status: DC | PRN
  Administered 2021-09-16: 10 mL

## 2021-09-16 MED ORDER — SODIUM CHLORIDE 0.9 % IV SOLN
60.0000 mg | Freq: Once | INTRAVENOUS | Status: AC
  Administered 2021-09-16: 60 mg via INTRAVENOUS
  Filled 2021-09-16: qty 20

## 2021-09-16 MED ORDER — FAMOTIDINE 20 MG PO TABS
20.0000 mg | ORAL_TABLET | Freq: Once | ORAL | Status: AC
  Administered 2021-09-16: 20 mg via ORAL
  Filled 2021-09-16: qty 1

## 2021-09-16 NOTE — Progress Notes (Signed)
Dr Irene Limbo aware of pt's labs/CR 1.72. [place] for treatment today.

## 2021-09-16 NOTE — Patient Instructions (Signed)
[facility] ONCOL[facility]  Discharge Instructions: Thank you for choosing [facility] to provide your oncology and hematology care.   If you have a lab appointment with the [facility], please go directly to the [facility] and check in at the registration area.   Wear comfortable clothing and clothing appropriate for easy access to any Portacath or PICC line.   We strive to give you quality time with your provider. You may need to reschedule your appointment if you arrive late (15 or more minutes).  Arriving late affects you and other patients whose appointments are after yours.  Also, if you miss three or more appointments without notifying the office, you may be dismissed from the clinic at the provider's discretion.      For prescription refill requests, have your pharmacy contact our office and allow 72 hours for refills to be completed.    Today you received the following chemotherapy and/or immunotherapy agents Kyprolis and Aredia      To help prevent nausea and vomiting after your treatment, we encourage you to take your nausea medication as directed.  BELOW ARE SYMPTOMS THAT SHOULD BE REPORTED IMMEDIATELY: *FEVER GREATER THAN 100.4 F (38 C) OR HIGHER *CHILLS OR SWEATING *NAUSEA AND VOMITING THAT IS NOT CONTROLLED WITH YOUR NAUSEA MEDICATION *UNUSUAL SHORTNESS OF BREATH *UNUSUAL BRUISING OR BLEEDING *URINARY PROBLEMS (pain or burning when urinating, or frequent urination) *BOWEL PROBLEMS (unusual diarrhea, constipation, pain near the anus) TENDERNESS IN MOUTH AND THROAT WITH OR WITHOUT PRESENCE OF ULCERS (sore throat, sores in mouth, or a toothache) UNUSUAL RASH, SWELLING OR PAIN  UNUSUAL VAGINAL DISCHARGE OR ITCHING   Items with * indicate a potential emergency and should be followed up as soon as possible or go to the Emergency Department if any problems should occur.  Please show the CHEMOTHERAPY ALERT CARD or IMMUNOTHERAPY ALERT CARD at  check-in to the Emergency Department and triage nurse.  Should you have questions after your visit or need to cancel or reschedule your appointment, please contact [facility]  Dept: [phone]  and follow the prompts.  Office hours are 8:00 a.m. to 4:30 p.m. Monday - Friday. Please note that voicemails left after 4:00 p.m. may not be returned until the following business day.  We are closed weekends and major holidays. You have access to a nurse at all times for urgent questions. Please call the main number to the clinic Dept: [phone] and follow the prompts.   For any non-urgent questions, you may also contact your provider using MyChart. We now offer e-Visits for anyone 68 and older to request care online for non-urgent symptoms. For details visit [email or web address].   Also download the MyChart app! Go to the app store, search "MyChart", open the app, select [facility], and log in with your MyChart username and password.  Due to Covid, a mask is required upon entering the hospital/clinic. If you do not have a mask, one will be given to you upon arrival. For doctor visits, patients may have 1 support person aged 86 or older with them. For treatment visits, patients cannot have anyone with them due to current Covid guidelines and our immunocompromised population.

## 2021-09-16 NOTE — Telephone Encounter (Signed)
Notified Patient of Prior Authorization approval for Lidocaine-Prilocaine 2.5% Cream. Medication is approved through 12/14/2021

## 2021-09-27 ENCOUNTER — Other Ambulatory Visit: Payer: Self-pay

## 2021-09-27 DIAGNOSIS — C9010 Plasma cell leukemia not having achieved remission: Secondary | ICD-10-CM

## 2021-09-27 MED ORDER — LENALIDOMIDE 10 MG PO CAPS: 10.0000 mg | ORAL_CAPSULE | Freq: Every day | ORAL | 0 refills | Status: DC

## 2021-09-30 ENCOUNTER — Inpatient Hospital Stay (HOSPITAL_BASED_OUTPATIENT_CLINIC_OR_DEPARTMENT_OTHER): Payer: BC Managed Care – PPO | Admitting: Hematology

## 2021-09-30 ENCOUNTER — Other Ambulatory Visit: Payer: Self-pay

## 2021-09-30 ENCOUNTER — Other Ambulatory Visit: Payer: BC Managed Care – PPO

## 2021-09-30 ENCOUNTER — Inpatient Hospital Stay: Payer: BC Managed Care – PPO

## 2021-09-30 ENCOUNTER — Ambulatory Visit: Payer: BC Managed Care – PPO

## 2021-09-30 VITALS — BP 127/71 | HR 70 | Temp 98.0°F | Resp 17 | Ht 68.0 in | Wt 156.2 lb

## 2021-09-30 DIAGNOSIS — C9010 Plasma cell leukemia not having achieved remission: Secondary | ICD-10-CM | POA: Diagnosis not present

## 2021-09-30 DIAGNOSIS — Z5111 Encounter for antineoplastic chemotherapy: Secondary | ICD-10-CM

## 2021-09-30 DIAGNOSIS — Z95828 Presence of other vascular implants and grafts: Secondary | ICD-10-CM

## 2021-09-30 DIAGNOSIS — Z9221 Personal history of antineoplastic chemotherapy: Secondary | ICD-10-CM | POA: Diagnosis not present

## 2021-09-30 DIAGNOSIS — Z7189 Other specified counseling: Secondary | ICD-10-CM

## 2021-09-30 LAB — CBC WITH DIFFERENTIAL/PLATELET
Abs Immature Granulocytes: 0.04 10*3/uL (ref 0.00–0.07)
Basophils Absolute: 0.1 10*3/uL (ref 0.0–0.1)
Basophils Relative: 1 %
Eosinophils Absolute: 0.4 10*3/uL (ref 0.0–0.5)
Eosinophils Relative: 5 %
HCT: 32.4 % — ABNORMAL LOW (ref 39.0–52.0)
Hemoglobin: 11.3 g/dL — ABNORMAL LOW (ref 13.0–17.0)
Immature Granulocytes: 1 %
Lymphocytes Relative: 22 %
Lymphs Abs: 1.5 10*3/uL (ref 0.7–4.0)
MCH: 32.6 pg (ref 26.0–34.0)
MCHC: 34.9 g/dL (ref 30.0–36.0)
MCV: 93.4 fL (ref 80.0–100.0)
Monocytes Absolute: 0.7 10*3/uL (ref 0.1–1.0)
Monocytes Relative: 10 %
Neutro Abs: 4.2 10*3/uL (ref 1.7–7.7)
Neutrophils Relative %: 61 %
Platelets: 219 10*3/uL (ref 150–400)
RBC: 3.47 MIL/uL — ABNORMAL LOW (ref 4.22–5.81)
RDW: 14.7 % (ref 11.5–15.5)
WBC: 6.9 10*3/uL (ref 4.0–10.5)
nRBC: 0 % (ref 0.0–0.2)

## 2021-09-30 LAB — CMP (CANCER CENTER ONLY)
ALT: 18 U/L (ref 0–44)
AST: 17 U/L (ref 15–41)
Albumin: 3.8 g/dL (ref 3.5–5.0)
Alkaline Phosphatase: 68 U/L (ref 38–126)
Anion gap: 6 (ref 5–15)
BUN: 22 mg/dL (ref 8–23)
CO2: 23 mmol/L (ref 22–32)
Calcium: 8.5 mg/dL — ABNORMAL LOW (ref 8.9–10.3)
Chloride: 112 mmol/L — ABNORMAL HIGH (ref 98–111)
Creatinine: 1.49 mg/dL — ABNORMAL HIGH (ref 0.61–1.24)
GFR, Estimated: 52 mL/min — ABNORMAL LOW (ref >60)
Glucose, Bld: 103 mg/dL — ABNORMAL HIGH (ref 70–99)
Potassium: 3.9 mmol/L (ref 3.5–5.1)
Sodium: 141 mmol/L (ref 135–145)
Total Bilirubin: 0.4 mg/dL (ref 0.3–1.2)
Total Protein: 7.1 g/dL (ref 6.5–8.1)

## 2021-09-30 MED ORDER — ACETAMINOPHEN 325 MG PO TABS
650.0000 mg | ORAL_TABLET | Freq: Once | ORAL | Status: AC
  Administered 2021-09-30: 650 mg via ORAL
  Filled 2021-09-30: qty 2

## 2021-09-30 MED ORDER — SODIUM CHLORIDE 0.9% FLUSH
10.0000 mL | INTRAVENOUS | Status: AC | PRN
  Administered 2021-09-30: 10 mL

## 2021-09-30 MED ORDER — SODIUM CHLORIDE 0.9 % IV SOLN
20.0000 mg | Freq: Once | INTRAVENOUS | Status: AC
  Administered 2021-09-30: 20 mg via INTRAVENOUS
  Filled 2021-09-30: qty 20

## 2021-09-30 MED ORDER — SODIUM CHLORIDE 0.9 % IV SOLN: Freq: Once | INTRAVENOUS | Status: DC

## 2021-09-30 MED ORDER — DEXTROSE 5 % IV SOLN
56.0000 mg/m2 | Freq: Once | INTRAVENOUS | Status: AC
  Administered 2021-09-30: 100 mg via INTRAVENOUS
  Filled 2021-09-30: qty 30

## 2021-09-30 MED ORDER — LIDOCAINE-PRILOCAINE 2.5-2.5 % EX CREA: 1.0000 "application " | TOPICAL_CREAM | CUTANEOUS | 0 refills | Status: DC | PRN

## 2021-09-30 MED ORDER — NICOTINE 14 MG/24HR TD PT24: MEDICATED_PATCH | TRANSDERMAL | 1 refills | Status: DC

## 2021-09-30 MED ORDER — SODIUM CHLORIDE 0.9 % IV SOLN: Freq: Once | INTRAVENOUS | Status: AC

## 2021-09-30 MED ORDER — SODIUM CHLORIDE 0.9 % IV SOLN: INTRAVENOUS | Status: AC

## 2021-09-30 MED ORDER — FAMOTIDINE 20 MG PO TABS
20.0000 mg | ORAL_TABLET | Freq: Once | ORAL | Status: AC
  Administered 2021-09-30: 20 mg via ORAL
  Filled 2021-09-30: qty 1

## 2021-09-30 NOTE — Patient Instructions (Signed)
[facility]   Discharge Instructions: Thank you for choosing [facility] to provide your oncology and hematology care.   If you have a lab appointment with the Cancer Center, please go directly to the Cancer Center and check in at the registration area.   Wear comfortable clothing and clothing appropriate for easy access to any Portacath or PICC line.   We strive to give you quality time with your provider. You may need to reschedule your appointment if you arrive late (15 or more minutes).  Arriving late affects you and other patients whose appointments are after yours.  Also, if you miss three or more appointments without notifying the office, you may be dismissed from the clinic at the provider's discretion.      For prescription refill requests, have your pharmacy contact our office and allow 72 hours for refills to be completed.    Today you received the following chemotherapy and/or immunotherapy agents: Carfilzomib (Kyprolis)     To help prevent nausea and vomiting after your treatment, we encourage you to take your nausea medication as directed.  BELOW ARE SYMPTOMS THAT SHOULD BE REPORTED IMMEDIATELY: *FEVER GREATER THAN 100.4 F (38 C) OR HIGHER *CHILLS OR SWEATING *NAUSEA AND VOMITING THAT IS NOT CONTROLLED WITH YOUR NAUSEA MEDICATION *UNUSUAL SHORTNESS OF BREATH *UNUSUAL BRUISING OR BLEEDING *URINARY PROBLEMS (pain or burning when urinating, or frequent urination) *BOWEL PROBLEMS (unusual diarrhea, constipation, pain near the anus) TENDERNESS IN MOUTH AND THROAT WITH OR WITHOUT PRESENCE OF ULCERS (sore throat, sores in mouth, or a toothache) UNUSUAL RASH, SWELLING OR PAIN  UNUSUAL VAGINAL DISCHARGE OR ITCHING   Items with * indicate a potential emergency and should be followed up as soon as possible or go to the Emergency Department if any problems should occur.  Please show the CHEMOTHERAPY ALERT CARD or IMMUNOTHERAPY ALERT CARD  at check-in to the Emergency Department and triage nurse.  Should you have questions after your visit or need to cancel or reschedule your appointment, please contact [facility]  Dept: [phone]  and follow the prompts.  Office hours are 8:00 a.m. to 4:30 p.m. Monday - Friday. Please note that voicemails left after 4:00 p.m. may not be returned until the following business day.  We are closed weekends and major holidays. You have access to a nurse at all times for urgent questions. Please call the main number to the clinic Dept: [phone] and follow the prompts.   For any non-urgent questions, you may also contact your provider using MyChart. We now offer e-Visits for anyone 18 and older to request care online for non-urgent symptoms. For details visit [email or web address].   Also download the MyChart app! Go to the app store, search "MyChart", open the app, select [place], and log in with your MyChart username and password.  Due to Covid, a mask is required upon entering the hospital/clinic. If you do not have a mask, one will be given to you upon arrival. For doctor visits, patients may have 1 support person aged 18 or older with them. For treatment visits, patients cannot have anyone with them due to current Covid guidelines and our immunocompromised population.  

## 2021-10-06 ENCOUNTER — Encounter: Payer: Self-pay | Admitting: Hematology

## 2021-10-06 NOTE — Progress Notes (Signed)
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 His labs today 12/14/2017 show hgb 8.3, platelets 524k. He is doing well overall. He has staples in his right upper leg following a recent surgery and has not been in touch with the surgeons for a f/u of this. He is not receiving physical therapy and is not getting around at home.  He notes that he was not scheduled for a post hospitalization nephrology f/u and has no PCP.  On review of systems, pt reports intermittent fatigue, SOB, pain to the left leg, dizziness, dry mouth and denies fever, chills, night sweats, dysuria and any other accompanying symptoms.   INTERVAL HISTORY:   Andres Fernandez presents today for follow up and continue management of his myeloma/plasma cell leukemia. The patient's last visit with [name] was on 08/05/2021.  He had a port placed on 08/19/2021.  Patient reports he is doing well and has no acute new symptoms.  No infection issues since his last clinic visit. Does not report any new toxicities from his carfilzomib or Revlimid.  No fevers no chills no night sweats no unexpected weight loss.  Grade 1 fatigue.  Lab results today reviewed with the patient.   REVIEW OF SYSTEMS:   .10 Point review of Systems was done is negative except as noted above.  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   Tobacco abuse     SURGICAL HISTORY: Past Surgical History:  Procedure Laterality Date   APPENDECTOMY     FEMUR IM NAIL Right 11/20/2017   Procedure: RIGHT FEMORAL INTRAMEDULLARY (IM) NAIL;  Surgeon: Nicholes Stairs, MD;  Location: [place];   Service: Orthopedics;  Laterality: Right;   IR IMAGING GUIDED PORT INSERTION  08/19/2021    SOCIAL HISTORY: Social History   Socioeconomic History   Marital status: Single    Spouse name: Not on file   Number of children: Not on file   Years of education: Not on file   Highest education level: Not on file  Occupational History   Not on file  Tobacco Use   Smoking status: Every Day    Packs/day: 0.25    Types: Cigarettes    Last attempt to quit: 11/09/2017    Years since quitting: 3.9   Smokeless tobacco: Never   Tobacco comments:    1 cigarette/day as of 09/25/18  Vaping Use   Vaping Use: Never used  Substance and Sexual Activity   Alcohol use: Yes   Drug use: No   Sexual activity: Not on file  Other Topics Concern   Not on file  Social History Narrative   Not on file   Social Determinants of Health   Financial Resource Strain: Not on file  Food Insecurity: Not on file  Transportation Needs: Not on file  Physical Activity: Not on file  Stress: Not on file  Social Connections: Not on file  Intimate Partner Violence: Not on file    FAMILY HISTORY: Family History  Problem Relation Age of Onset   COPD Mother    Liver cancer Mother     ALLERGIES:  has No Known Allergies.  MEDICATIONS:  . Current Outpatient Medications on File Prior to Visit  Medication Sig Dispense Refill   acyclovir (ZOVIRAX) 400 MG tablet Take 1 tablet (400 mg total) by mouth daily. 30 tablet 11   Albuterol Sulfate (PROAIR RESPICLICK) 179 (90 Base) MCG/ACT AEPB Inhale into the lungs.     aspirin EC 81 MG tablet Take 1 tablet (81 mg total) by mouth daily. (Patient taking differently: Take 81 mg by mouth at bedtime.) 60 tablet 11   cholecalciferol (VITAMIN D) 400 units TABS tablet Take 400 Units by mouth at bedtime.      lenalidomide (REVLIMID) 10 MG capsule Take 1 capsule (10 mg total) by mouth daily. Take for 21 days on, 7 days off, repeat every 28 days. 21 capsule 0   Multiple  Vitamins-Minerals (MULTIVITAMIN WITH MINERALS) tablet Take 1 tablet by mouth at bedtime.      ondansetron (ZOFRAN) 4 MG tablet Take 2 tablets (8 mg total) by mouth every 8 (eight) hours as needed for nausea. 30 tablet 0   Vitamin D, Ergocalciferol, (DRISDOL) 1.25 MG (50000 UNIT) CAPS capsule Take 1 capsule (50,000 Units total) by mouth once a week. 12 capsule 6   Current Facility-Administered Medications on File Prior to Visit  Medication Dose Route Frequency Provider Last Rate Last Admin   0.9 %  sodium chloride infusion   Intravenous Once Brunetta Genera, MD       sodium chloride flush (NS) 0.9 % injection 10 mL  10 mL Intracatheter PRN Brunetta Genera, MD   10 mL at 09/16/21 1623    PHYSICAL EXAMINATION:  ECOG FS:1 - Symptomatic but completely ambulatory  Vitals:   09/30/21 1008  BP: 127/71  Pulse: 70  Resp: 17  Temp: 98 F (36.7 C)  SpO2: 100%    Wt Readings from Last 3 Encounters:  09/30/21 156 lb 3.2 oz (70.9 kg)  09/16/21 157 lb 8 oz (71.4 kg)  08/19/21 153 lb 12.8 oz (69.8 kg)   Body mass index is 23.75 kg/m.    [name] GENERAL:alert, in no acute distress and comfortable SKIN: no acute rashes, no significant lesions EYES: conjunctiva are pink and non-injected, sclera anicteric OROPHARYNX: MMM, no exudates, no oropharyngeal erythema or ulceration NECK: supple, no JVD LYMPH:  no palpable lymphadenopathy in the cervical, axillary or inguinal regions LUNGS: clear to auscultation b/l with normal respiratory effort HEART: regular rate & rhythm ABDOMEN:  normoactive bowel sounds , non tender, not distended. Extremity: no pedal edema PSYCH: alert & oriented x 3 with fluent speech NEURO: no focal motor/sensory deficits  LABORATORY DATA:  I have reviewed the data as listed  CBC Latest Ref Rng & Units 09/30/2021 09/16/2021 09/02/2021  WBC 4.0 - 10.5 K/uL 6.9 8.0 5.4  Hemoglobin 13.0 - 17.0 g/dL 11.3(L) 12.0(L) 11.8(L)  Hematocrit 39.0 - 52.0 % 32.4(L) 34.8(L) 34.0(L)   Platelets 150 - 400 K/uL 219 182 195   CBC    Component Value Date/Time   WBC 6.9 09/30/2021 0947   RBC 3.47 (L) 09/30/2021 0947   HGB 11.3 (L) 09/30/2021 0947   HGB 11.9 (L) 07/08/2021 1009   HGB 8.3 (L) 12/14/2017 1230   HCT 32.4 (L) 09/30/2021 0947   HCT 23.9 (L) 12/14/2017 1230   PLT 219 09/30/2021 0947   PLT 211 07/08/2021 1009   PLT 524 (H) 12/14/2017 1230   MCV 93.4 09/30/2021 0947   MCV 88.4 12/14/2017 1230   MCH 32.6 09/30/2021 0947   MCHC 34.9 09/30/2021 0947  RDW 14.7 09/30/2021 0947   RDW 15.3 (H) 12/14/2017 1230   LYMPHSABS 1.5 09/30/2021 0947   LYMPHSABS 1.4 12/14/2017 1230   MONOABS 0.7 09/30/2021 0947   MONOABS 0.6 12/14/2017 1230   EOSABS 0.4 09/30/2021 0947   EOSABS 0.1 12/14/2017 1230   BASOSABS 0.1 09/30/2021 0947   BASOSABS 0.1 12/14/2017 1230     CMP Latest Ref Rng & Units 09/30/2021 09/16/2021 09/02/2021  Glucose 70 - 99 mg/dL 103(H) 124(H) 107(H)  BUN 8 - 23 mg/dL 22 24(H) 27(H)  Creatinine 0.61 - 1.24 mg/dL 1.49(H) 1.72(H) 1.69(H)  Sodium 135 - 145 mmol/L 141 140 141  Potassium 3.5 - 5.1 mmol/L 3.9 3.8 4.1  Chloride 98 - 111 mmol/L 112(H) 109 109  CO2 22 - 32 mmol/L 23 22 21(L)  Calcium 8.9 - 10.3 mg/dL 8.5(L) 9.1 9.0  Total Protein 6.5 - 8.1 g/dL 7.1 7.2 6.9  Total Bilirubin 0.3 - 1.2 mg/dL 0.4 0.6 0.5  Alkaline Phos 38 - 126 U/L 68 75 68  AST 15 - 41 U/L 17 11(L) 13(L)  ALT 0 - 44 U/L 18 12 12     03/09/20 ([id number]) Bone Marrow Biopsy at [facility]   07/02/2019 Bone Marrow Biopsy ([id number]) at [facility]    08/07/18 BM Bx:        RADIOGRAPHIC STUDIES: I have personally reviewed the radiological images as listed and agreed with the findings in the report. No results found.  ASSESSMENT & PLAN:    65 y.o. 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   Initial BM Bx showed >95% involvement with kappa restricted serum free light chains. 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 MoL Cy-   08/07/18 BM Bx revealed normocellular bone marrow with trilineage hematopoiesis and 1% plasma cells which indicates the pt is in remission 08/06/18 PET/CT revealed  Expansile lytic lesion involving the left iliac bone, unchanged. Lytic lesion involving the sternum, unchanged. While both are suspicious for myeloma, neither lesion is FDG avid. No hypermetabolic osseous lesions in the visualized axial and appendicular skeleton. Please note that the bilateral lower extremities were not imaged. No suspicious lymphadenopathy. Spleen is normal in size.    Pt began Ninlaro 64m maintenance after pt finished 13 cycles of CyBorD and BM bx showed only 1% plasma cells, then resumed CyBorD and held Ninlaro in anticipation of his tandem bone marrow transplants [facility] in late December 2019 [facility]  11/19/18 High dose chemotherapy with Melphalan 1415mm2 and autologous stem cell rescue  03/20/19 High dose chemotherapy with Melphalan 20075m2 and autologous stem cell rescue. Non-infectious diarrhea, uncomplicated course otherwise.  07/02/2019 PET scan with results revealing "1. No FDG avid bone lesions are identified. 2.  Asymmetric uptake within the right, greater than left, palatine tonsils with mild fullness of the right tonsillar soft tissue. ENT consult vs attention on follow up suggested. 3.  Innumerable mixed sclerotic and lytic lesions throughout the axial and appendicular skeleton without associated hypermetabolic activity. 4.  Ancillary CT findings as above."  07/02/2019 bone marrow biopsy ([id number])with results showing: "Trilineage hematopoieses with maturation. No plasma cell myeloma identified. Mild  anemia and No Rouleaux or circulating plasma cells identified in the peripheral blood."  2) s/p Prophylactic IM nailing for rt femur  completed Physical Therapy at the [facility].  -patient previously reported improvement and is currently progressively back to full time work.  3) Thrombocytopenia resolved   4) Renal insufficiency -- likely primarily related to Myeloma. stable Plan -maintain follow up with nephrology referral to Dr Marval Regal [facility] for continued treatment -Pt completed 8 weeks of maverick for hep c with Dr Linus Salmons on 07/08/18.  -Kidney numbers improved. Creatinine in the 1.7-2.2 range   5) h/o tobacco abuse-counseled on smoking cessation -Currently not smoking.   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 -Hgb improved/stable   -monitor  PLAN: -Discussed pt labwork today, 09/30/2021- stable. -Multiple regular toxicities from continuing his current maintenance carfilzomib and Revlimid. -Recommended pt drink 48-64 oz water daily. -The pt has no prohibitive toxicities from continuing 10 mg Revlimid 3 weeks on, 1 week off at this time. -Continue daily ASA and Vitamin D. -Continue Pamidronate q8weeks. -Recommended he take his flu shot and new pfizer bivalent COVID booster   FOLLOW UP: Plz schedule next 8 doses of every 2 weekly carfilzomib with portflush and labs MD visit in 6 weeks -Continue Pamidronate q8weeks- plz schedule next 3 doses   . The total time spent in the appointment was 20 minutes and more than 50% was on counseling and direct patient cares.   All of the patient's questions were answered with apparent satisfaction. The patient knows to call the clinic with any problems, questions or concerns.  Sullivan Lone MD [place] AAHIVMS [facility] Hematology/Oncology Physician [facility]

## 2021-10-13 MED FILL — Dexamethasone Sodium Phosphate Inj 100 MG/10ML: INTRAMUSCULAR | Qty: 2 | Status: AC

## 2021-10-14 ENCOUNTER — Inpatient Hospital Stay: Payer: BC Managed Care – PPO | Attending: Hematology

## 2021-10-14 ENCOUNTER — Inpatient Hospital Stay: Payer: BC Managed Care – PPO

## 2021-10-14 ENCOUNTER — Other Ambulatory Visit: Payer: Self-pay

## 2021-10-14 VITALS — BP 125/71 | HR 83 | Temp 98.5°F | Resp 18

## 2021-10-14 DIAGNOSIS — Z23 Encounter for immunization: Secondary | ICD-10-CM | POA: Diagnosis not present

## 2021-10-14 DIAGNOSIS — C9011 Plasma cell leukemia in remission: Secondary | ICD-10-CM | POA: Insufficient documentation

## 2021-10-14 DIAGNOSIS — B948 Sequelae of other specified infectious and parasitic diseases: Secondary | ICD-10-CM | POA: Diagnosis not present

## 2021-10-14 DIAGNOSIS — C9001 Multiple myeloma in remission: Secondary | ICD-10-CM | POA: Diagnosis present

## 2021-10-14 DIAGNOSIS — Z5112 Encounter for antineoplastic immunotherapy: Secondary | ICD-10-CM | POA: Diagnosis present

## 2021-10-14 DIAGNOSIS — Z5111 Encounter for antineoplastic chemotherapy: Secondary | ICD-10-CM

## 2021-10-14 DIAGNOSIS — Z95828 Presence of other vascular implants and grafts: Secondary | ICD-10-CM

## 2021-10-14 DIAGNOSIS — C9010 Plasma cell leukemia not having achieved remission: Secondary | ICD-10-CM

## 2021-10-14 DIAGNOSIS — J101 Influenza due to other identified influenza virus with other respiratory manifestations: Secondary | ICD-10-CM | POA: Insufficient documentation

## 2021-10-14 DIAGNOSIS — C9000 Multiple myeloma not having achieved remission: Secondary | ICD-10-CM

## 2021-10-14 DIAGNOSIS — Z7189 Other specified counseling: Secondary | ICD-10-CM

## 2021-10-14 LAB — CMP (CANCER CENTER ONLY)
ALT: 13 U/L (ref 0–44)
AST: 12 U/L — ABNORMAL LOW (ref 15–41)
Albumin: 3.8 g/dL (ref 3.5–5.0)
Alkaline Phosphatase: 72 U/L (ref 38–126)
Anion gap: 10 (ref 5–15)
BUN: 26 mg/dL — ABNORMAL HIGH (ref 8–23)
CO2: 22 mmol/L (ref 22–32)
Calcium: 8.5 mg/dL — ABNORMAL LOW (ref 8.9–10.3)
Chloride: 110 mmol/L (ref 98–111)
Creatinine: 1.58 mg/dL — ABNORMAL HIGH (ref 0.61–1.24)
GFR, Estimated: 49 mL/min — ABNORMAL LOW (ref >60)
Glucose, Bld: 115 mg/dL — ABNORMAL HIGH (ref 70–99)
Potassium: 4.2 mmol/L (ref 3.5–5.1)
Sodium: 142 mmol/L (ref 135–145)
Total Bilirubin: 0.5 mg/dL (ref 0.3–1.2)
Total Protein: 6.9 g/dL (ref 6.5–8.1)

## 2021-10-14 LAB — CBC WITH DIFFERENTIAL/PLATELET
Abs Immature Granulocytes: 0.03 10*3/uL (ref 0.00–0.07)
Basophils Absolute: 0.1 10*3/uL (ref 0.0–0.1)
Basophils Relative: 1 %
Eosinophils Absolute: 0.2 10*3/uL (ref 0.0–0.5)
Eosinophils Relative: 3 %
HCT: 34.8 % — ABNORMAL LOW (ref 39.0–52.0)
Hemoglobin: 11.9 g/dL — ABNORMAL LOW (ref 13.0–17.0)
Immature Granulocytes: 0 %
Lymphocytes Relative: 23 %
Lymphs Abs: 1.5 10*3/uL (ref 0.7–4.0)
MCH: 32.6 pg (ref 26.0–34.0)
MCHC: 34.2 g/dL (ref 30.0–36.0)
MCV: 95.3 fL (ref 80.0–100.0)
Monocytes Absolute: 0.4 10*3/uL (ref 0.1–1.0)
Monocytes Relative: 6 %
Neutro Abs: 4.5 10*3/uL (ref 1.7–7.7)
Neutrophils Relative %: 67 %
Platelets: 237 10*3/uL (ref 150–400)
RBC: 3.65 MIL/uL — ABNORMAL LOW (ref 4.22–5.81)
RDW: 15.3 % (ref 11.5–15.5)
WBC: 6.7 10*3/uL (ref 4.0–10.5)
nRBC: 0 % (ref 0.0–0.2)

## 2021-10-14 MED ORDER — DEXTROSE 5 % IV SOLN
56.0000 mg/m2 | Freq: Once | INTRAVENOUS | Status: AC
  Administered 2021-10-14: 100 mg via INTRAVENOUS
  Filled 2021-10-14: qty 30

## 2021-10-14 MED ORDER — SODIUM CHLORIDE 0.9 % IV SOLN: Freq: Once | INTRAVENOUS | Status: AC

## 2021-10-14 MED ORDER — SODIUM CHLORIDE 0.9% FLUSH
10.0000 mL | INTRAVENOUS | Status: DC | PRN
  Administered 2021-10-14: 10 mL

## 2021-10-14 MED ORDER — SODIUM CHLORIDE 0.9 % IV SOLN
20.0000 mg | Freq: Once | INTRAVENOUS | Status: AC
  Administered 2021-10-14: 20 mg via INTRAVENOUS
  Filled 2021-10-14: qty 20

## 2021-10-14 MED ORDER — ACETAMINOPHEN 325 MG PO TABS
650.0000 mg | ORAL_TABLET | Freq: Once | ORAL | Status: AC
  Administered 2021-10-14: 650 mg via ORAL
  Filled 2021-10-14: qty 2

## 2021-10-14 MED ORDER — INFLUENZA VAC SPLIT QUAD 0.5 ML IM SUSY
0.5000 mL | PREFILLED_SYRINGE | Freq: Once | INTRAMUSCULAR | Status: AC
  Administered 2021-10-14: 0.5 mL via INTRAMUSCULAR
  Filled 2021-10-14: qty 0.5

## 2021-10-14 MED ORDER — FAMOTIDINE 20 MG PO TABS
20.0000 mg | ORAL_TABLET | Freq: Once | ORAL | Status: AC
  Administered 2021-10-14: 20 mg via ORAL
  Filled 2021-10-14: qty 1

## 2021-10-14 MED ORDER — HEPARIN SOD (PORK) LOCK FLUSH 100 UNIT/ML IV SOLN
500.0000 [IU] | Freq: Once | INTRAVENOUS | Status: AC | PRN
  Administered 2021-10-14: 500 [IU]

## 2021-10-14 MED ORDER — SODIUM CHLORIDE 0.9% FLUSH
10.0000 mL | INTRAVENOUS | Status: AC | PRN
  Administered 2021-10-14: 10 mL

## 2021-10-14 NOTE — Progress Notes (Signed)
Creatinine 1.58.  Per MD ok to proceed with treatment.

## 2021-10-14 NOTE — Patient Instructions (Signed)
[facility]  Discharge Instructions: Thank you for choosing [facility] to provide your oncology and hematology care.   If you have a lab appointment with the Cancer Center, please go directly to the Cancer Center and check in at the registration area.   Wear comfortable clothing and clothing appropriate for easy access to any Portacath or PICC line.   We strive to give you quality time with your provider. You may need to reschedule your appointment if you arrive late (15 or more minutes).  Arriving late affects you and other patients whose appointments are after yours.  Also, if you miss three or more appointments without notifying the office, you may be dismissed from the clinic at the provider's discretion.      For prescription refill requests, have your pharmacy contact our office and allow 72 hours for refills to be completed.    Today you received the following chemotherapy and/or immunotherapy agents: Kyprolis    To help prevent nausea and vomiting after your treatment, we encourage you to take your nausea medication as directed.  BELOW ARE SYMPTOMS THAT SHOULD BE REPORTED IMMEDIATELY: . *FEVER GREATER THAN 100.4 F (38 C) OR HIGHER . *CHILLS OR SWEATING . *NAUSEA AND VOMITING THAT IS NOT CONTROLLED WITH YOUR NAUSEA MEDICATION . *UNUSUAL SHORTNESS OF BREATH . *UNUSUAL BRUISING OR BLEEDING . *URINARY PROBLEMS (pain or burning when urinating, or frequent urination) . *BOWEL PROBLEMS (unusual diarrhea, constipation, pain near the anus) . TENDERNESS IN MOUTH AND THROAT WITH OR WITHOUT PRESENCE OF ULCERS (sore throat, sores in mouth, or a toothache) . UNUSUAL RASH, SWELLING OR PAIN  . UNUSUAL VAGINAL DISCHARGE OR ITCHING   Items with * indicate a potential emergency and should be followed up as soon as possible or go to the Emergency Department if any problems should occur.  Please show the CHEMOTHERAPY ALERT CARD or IMMUNOTHERAPY ALERT  CARD at check-in to the Emergency Department and triage nurse.  Should you have questions after your visit or need to cancel or reschedule your appointment, please contact [facility]  Dept: [phone]  and follow the prompts.  Office hours are 8:00 a.m. to 4:30 p.m. Monday - Friday. Please note that voicemails left after 4:00 p.m. may not be returned until the following business day.  We are closed weekends and major holidays. You have access to a nurse at all times for urgent questions. Please call the main number to the clinic Dept: [phone] and follow the prompts.   For any non-urgent questions, you may also contact your provider using MyChart. We now offer e-Visits for anyone 18 and older to request care online for non-urgent symptoms. For details visit [email or web address].   Also download the MyChart app! Go to the app store, search "MyChart", open the app, select [facility], and log in with your MyChart username and password.  Due to Covid, a mask is required upon entering the hospital/clinic. If you do not have a mask, one will be given to you upon arrival. For doctor visits, patients may have 1 support person aged 18 or older with them. For treatment visits, patients cannot have anyone with them due to current Covid guidelines and our immunocompromised population.   

## 2021-10-25 ENCOUNTER — Other Ambulatory Visit: Payer: Self-pay

## 2021-10-25 DIAGNOSIS — C9010 Plasma cell leukemia not having achieved remission: Secondary | ICD-10-CM

## 2021-10-25 MED ORDER — LENALIDOMIDE 10 MG PO CAPS: 10.0000 mg | ORAL_CAPSULE | Freq: Every day | ORAL | 0 refills | Status: DC

## 2021-10-28 ENCOUNTER — Ambulatory Visit (HOSPITAL_COMMUNITY)
Admission: RE | Admit: 2021-10-28 | Discharge: 2021-10-28 | Disposition: A | Discharge status: Home / Self Care | Payer: BC Managed Care – PPO | Source: Ambulatory Visit | Attending: Physician Assistant | Admitting: Physician Assistant

## 2021-10-28 ENCOUNTER — Inpatient Hospital Stay: Payer: BC Managed Care – PPO

## 2021-10-28 ENCOUNTER — Other Ambulatory Visit: Payer: Self-pay | Admitting: Hematology

## 2021-10-28 ENCOUNTER — Other Ambulatory Visit: Payer: Self-pay

## 2021-10-28 ENCOUNTER — Ambulatory Visit (HOSPITAL_BASED_OUTPATIENT_CLINIC_OR_DEPARTMENT_OTHER): Payer: BC Managed Care – PPO | Admitting: Physician Assistant

## 2021-10-28 VITALS — BP 147/76 | HR 88 | Temp 99.6°F | Resp 18

## 2021-10-28 DIAGNOSIS — Z7189 Other specified counseling: Secondary | ICD-10-CM

## 2021-10-28 DIAGNOSIS — R051 Acute cough: Secondary | ICD-10-CM

## 2021-10-28 DIAGNOSIS — C9010 Plasma cell leukemia not having achieved remission: Secondary | ICD-10-CM

## 2021-10-28 DIAGNOSIS — C9011 Plasma cell leukemia in remission: Secondary | ICD-10-CM

## 2021-10-28 DIAGNOSIS — J111 Influenza due to unidentified influenza virus with other respiratory manifestations: Secondary | ICD-10-CM

## 2021-10-28 DIAGNOSIS — Z5111 Encounter for antineoplastic chemotherapy: Secondary | ICD-10-CM

## 2021-10-28 DIAGNOSIS — C9000 Multiple myeloma not having achieved remission: Secondary | ICD-10-CM

## 2021-10-28 DIAGNOSIS — M84551A Pathological fracture in neoplastic disease, right femur, initial encounter for fracture: Secondary | ICD-10-CM

## 2021-10-28 DIAGNOSIS — Z95828 Presence of other vascular implants and grafts: Secondary | ICD-10-CM

## 2021-10-28 LAB — CBC WITH DIFFERENTIAL/PLATELET
Abs Immature Granulocytes: 0.02 10*3/uL (ref 0.00–0.07)
Basophils Absolute: 0.1 10*3/uL (ref 0.0–0.1)
Basophils Relative: 1 %
Eosinophils Absolute: 0 10*3/uL (ref 0.0–0.5)
Eosinophils Relative: 0 %
HCT: 35.9 % — ABNORMAL LOW (ref 39.0–52.0)
Hemoglobin: 12.1 g/dL — ABNORMAL LOW (ref 13.0–17.0)
Immature Granulocytes: 0 %
Lymphocytes Relative: 13 %
Lymphs Abs: 0.8 10*3/uL (ref 0.7–4.0)
MCH: 32.2 pg (ref 26.0–34.0)
MCHC: 33.7 g/dL (ref 30.0–36.0)
MCV: 95.5 fL (ref 80.0–100.0)
Monocytes Absolute: 0.6 10*3/uL (ref 0.1–1.0)
Monocytes Relative: 10 %
Neutro Abs: 4.4 10*3/uL (ref 1.7–7.7)
Neutrophils Relative %: 76 %
Platelets: 164 10*3/uL (ref 150–400)
RBC: 3.76 MIL/uL — ABNORMAL LOW (ref 4.22–5.81)
RDW: 15.2 % (ref 11.5–15.5)
WBC: 5.9 10*3/uL (ref 4.0–10.5)
nRBC: 0 % (ref 0.0–0.2)

## 2021-10-28 LAB — CMP (CANCER CENTER ONLY)
ALT: 13 U/L (ref 0–44)
AST: 12 U/L — ABNORMAL LOW (ref 15–41)
Albumin: 3.7 g/dL (ref 3.5–5.0)
Alkaline Phosphatase: 76 U/L (ref 38–126)
Anion gap: 11 (ref 5–15)
BUN: 29 mg/dL — ABNORMAL HIGH (ref 8–23)
CO2: 21 mmol/L — ABNORMAL LOW (ref 22–32)
Calcium: 8.7 mg/dL — ABNORMAL LOW (ref 8.9–10.3)
Chloride: 110 mmol/L (ref 98–111)
Creatinine: 2.05 mg/dL — ABNORMAL HIGH (ref 0.61–1.24)
GFR, Estimated: 36 mL/min — ABNORMAL LOW (ref >60)
Glucose, Bld: 129 mg/dL — ABNORMAL HIGH (ref 70–99)
Potassium: 3.8 mmol/L (ref 3.5–5.1)
Sodium: 142 mmol/L (ref 135–145)
Total Bilirubin: 0.7 mg/dL (ref 0.3–1.2)
Total Protein: 7.4 g/dL (ref 6.5–8.1)

## 2021-10-28 LAB — RESP PANEL BY RT-PCR (RSV, FLU A&B, COVID)  RVPGX2
Influenza A by PCR: POSITIVE — AB
Influenza B by PCR: NEGATIVE
Resp Syncytial Virus by PCR: NEGATIVE
SARS Coronavirus 2 by RT PCR: NEGATIVE

## 2021-10-28 MED ORDER — OSELTAMIVIR PHOSPHATE 30 MG PO CAPS: 30.0000 mg | ORAL_CAPSULE | Freq: Every day | ORAL | 0 refills | Status: AC

## 2021-10-28 MED ORDER — ACETAMINOPHEN 325 MG PO TABS
650.0000 mg | ORAL_TABLET | Freq: Once | ORAL | Status: AC
  Administered 2021-10-28: 650 mg via ORAL
  Filled 2021-10-28: qty 2

## 2021-10-28 MED ORDER — SODIUM CHLORIDE 0.9% FLUSH
10.0000 mL | INTRAVENOUS | Status: AC | PRN
  Administered 2021-10-28: 10 mL

## 2021-10-28 MED ORDER — SODIUM CHLORIDE 0.9 % IV SOLN: Freq: Once | INTRAVENOUS | Status: DC

## 2021-10-28 MED ORDER — SODIUM CHLORIDE 0.9% FLUSH
10.0000 mL | INTRAVENOUS | Status: DC | PRN
  Administered 2021-10-28: 10 mL

## 2021-10-28 MED ORDER — SODIUM CHLORIDE 0.9 % IV SOLN
60.0000 mg | Freq: Once | INTRAVENOUS | Status: DC
  Filled 2021-10-28: qty 10

## 2021-10-28 MED ORDER — SODIUM CHLORIDE 0.9 % IV SOLN: INTRAVENOUS | Status: AC

## 2021-10-28 MED ORDER — SODIUM CHLORIDE 0.9 % IV SOLN
20.0000 mg | Freq: Once | INTRAVENOUS | Status: AC
  Administered 2021-10-28: 20 mg via INTRAVENOUS
  Filled 2021-10-28: qty 20

## 2021-10-28 MED ORDER — HEPARIN SOD (PORK) LOCK FLUSH 100 UNIT/ML IV SOLN
500.0000 [IU] | Freq: Once | INTRAVENOUS | Status: AC | PRN
  Administered 2021-10-28: 500 [IU]

## 2021-10-28 MED ORDER — FAMOTIDINE 20 MG PO TABS
20.0000 mg | ORAL_TABLET | Freq: Once | ORAL | Status: AC
  Administered 2021-10-28: 20 mg via ORAL
  Filled 2021-10-28: qty 1

## 2021-10-28 MED ORDER — DEXTROSE 5 % IV SOLN
56.0000 mg/m2 | Freq: Once | INTRAVENOUS | Status: DC
  Filled 2021-10-28: qty 50

## 2021-10-28 NOTE — Progress Notes (Signed)
Pt states he took Nyquil yesterday afternoon & has been feeling "wobbly & lightheaded" ever since, came to infusion in [place].  VS stable.  Dr. Irene Limbo informed, one liter NS over 2 hours ordered.  Per Dr. Irene Limbo ok to treat - creatnine 2.05  Per Dr. Irene Limbo, patient to get aredia today.  Per Dr Irene Limbo, no tx today, pt evaluated by Tommi Emery PA.

## 2021-10-28 NOTE — Patient Instructions (Addendum)
Do NOT take your Revlimid while you are feeling sick.  Have your chest xray on your way out today and I will call you with the results of that and your covid/flu test later today  Stay well hydrated and continue tylenol and nyquil.   You have an appointment with Dr. Irene Limbo in 2 weeks, keep that as scheduled

## 2021-10-28 NOTE — Progress Notes (Signed)
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[place])   11/30/2017 - 10/30/2018 Chemotherapy   The patient had dexamethasone (DECADRON) 4 MG tablet, 1 of 1 cycle, Start date: --, End date: -- bortezomib SQ (VELCADE) chemo injection 2.5 mg, 1.3 mg/m2 = 2.5 mg, Subcutaneous,  Once, 14 of 15 cycles Administration: 2.25 mg (12/01/2017), 2.25 mg (12/04/2017), 2.25 mg (12/20/2017), 2.25 mg (12/24/2017), 2.25 mg (12/28/2017), 2.25 mg (12/31/2017), 2.25 mg (01/11/2018), 2.25 mg (01/14/2018), 2.25 mg (01/18/2018), 2.25 mg (01/21/2018), 2.25 mg (02/01/2018), 2.25 mg (02/04/2018), 2.25 mg (02/08/2018), 2.25 mg (02/11/2018), 2.25 mg (02/22/2018), 2.25 mg (02/25/2018), 2.25 mg (03/01/2018), 2.25 mg (03/04/2018), 2.25 mg (03/15/2018), 2.25 mg (03/18/2018), 2.25 mg (03/22/2018), 2.25 mg (03/25/2018), 2.25 mg (04/05/2018), 2.25 mg (04/08/2018), 2.25 mg (04/12/2018), 2.25 mg (04/15/2018), 2.25 mg (04/26/2018), 2.25 mg (04/29/2018), 2.25 mg (05/03/2018), 2.25 mg (05/07/2018), 2.25 mg (05/17/2018), 2.25 mg (05/20/2018), 2.25 mg (05/24/2018), 2.25 mg (05/27/2018), 2.25 mg (06/07/2018), 2.25 mg (06/10/2018), 2.25 mg (06/14/2018), 2.25 mg (06/17/2018), 2.25 mg (06/28/2018), 2.25 mg (07/02/2018), 2.25 mg (07/05/2018), 2.25 mg (07/08/2018), 2.25 mg (07/19/2018), 2.25 mg (07/22/2018), 2.25 mg (07/26/2018), 2.25 mg (07/29/2018), 2.25 mg (08/09/2018), 2.25 mg (08/13/2018), 2.25 mg (08/16/2018), 2.25 mg (08/19/2018), 2.5 mg (10/30/2018)   for chemotherapy treatment.     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  Patient states symptoms have been going on x4 days.  Cough is nonproductive.  He also endorses runny nose and losing his voice.  He has been checking his temperature and has no fever at home.  He took NyQuil and Tylenol last night with minimal symptom improvement.  Patient admits to a coworker being sick this week with bronchitis, no other sick contacts or known COVID exposures.  Patient is tolerating p.o. intake without difficulty.  Denies chills, sinus pressure, shortness of breath, chest pain, abdominal pain, nausea, vomiting, urinary symptoms, diarrhea, rash.   ROS  All other systems are reviewed and are negative for acute change except as noted in the HPI.   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   Tobacco abuse      Past Surgical History:  Procedure Laterality Date   APPENDECTOMY     FEMUR IM NAIL Right 11/20/2017   Procedure: RIGHT FEMORAL INTRAMEDULLARY (IM) NAIL;  Surgeon: Nicholes Stairs, MD;  Location:  [facility];  Service: Orthopedics;  Laterality: Right;   IR IMAGING GUIDED PORT INSERTION  08/19/2021    Social History   Socioeconomic History   Marital status: Single    Spouse name: Not on file   Number of children: Not on file   Years of education: Not on file   Highest  education level: Not on file  Occupational History   Not on file  Tobacco Use   Smoking status: Every Day    Packs/day: 0.25    Types: Cigarettes    Last attempt to quit: 11/09/2017    Years since quitting: 3.9   Smokeless tobacco: Never   Tobacco comments:    1 cigarette/day as of 09/25/18  Vaping Use   Vaping Use: Never used  Substance and Sexual Activity   Alcohol use: Yes   Drug use: No   Sexual activity: Not on file  Other Topics Concern   Not on file  Social History Narrative   Not on file   Social Determinants of Health   Financial Resource Strain: Not on file  Food Insecurity: Not on file  Transportation Needs: Not on file  Physical Activity: Not on file  Stress: Not on file  Social Connections: Not on file  Intimate Partner Violence: Not on file    Family History  Problem Relation Age of Onset   COPD Mother    Liver cancer Mother      Current Outpatient Medications:    oseltamivir (TAMIFLU) 30 MG capsule, Take 1 capsule (30 mg total) by mouth daily for 7 days., Disp: 7 capsule, Rfl: 0   acyclovir (ZOVIRAX) 400 MG tablet, Take 1 tablet (400 mg total) by mouth daily., Disp: 30 tablet, Rfl: 11   Albuterol Sulfate (PROAIR RESPICLICK) 809 (90 Base) MCG/ACT AEPB, Inhale into the lungs., Disp: , Rfl:    aspirin EC 81 MG tablet, Take 1 tablet (81 mg total) by mouth daily. (Patient taking differently: Take 81 mg by mouth at bedtime.), Disp: 60 tablet, Rfl: 11   cholecalciferol (VITAMIN D) 400 units TABS tablet, Take 400 Units by mouth at bedtime. , Disp: , Rfl:    lenalidomide (REVLIMID) 10 MG capsule, Take 1 capsule (10 mg total) by mouth daily. Take for 21 days on, 7 days off, repeat every 28 days., Disp: 21 capsule, Rfl: 0   lidocaine-prilocaine (EMLA) cream, Apply 1 application topically as needed. Apply to Port-A-Cath at least 1 hr prior to treatment., Disp: 30 g, Rfl: 0   Multiple Vitamins-Minerals (MULTIVITAMIN WITH MINERALS) tablet, Take 1 tablet by mouth at  bedtime. , Disp: , Rfl:    nicotine (NICODERM CQ - DOSED IN MG/24 HOURS) 14 mg/24hr patch, PLACE 1 PATCH (14 MG TOTAL) ONTO THE SKIN DAILY., Disp: 28 patch, Rfl: 1   ondansetron (ZOFRAN) 4 MG tablet, Take 2 tablets (8 mg total) by mouth every 8 (eight) hours as needed for nausea., Disp: 30 tablet, Rfl: 0   Vitamin D, Ergocalciferol, (DRISDOL) 1.25 MG (50000 UNIT) CAPS capsule, Take 1 capsule (50,000 Units total) by mouth once a week., Disp: 12 capsule, Rfl: 6 No current facility-administered medications for this visit.  Facility-Administered Medications Ordered in Other Visits:    0.9 %  sodium chloride infusion, , Intravenous, Once, Kale, Cloria Spring, MD   0.9 %  sodium chloride infusion, , Intravenous, Once, Irene Limbo, Cloria Spring, MD   carfilzomib (KYPROLIS) 100 mg in dextrose 5 % 100 mL chemo infusion, 56 mg/m2 (Treatment Plan Recorded), Intravenous, Once, [place], Gautam  Vivien Rossetti, MD   pamidronate (AREDIA) 60 mg in sodium chloride 0.9 % 500 mL IVPB, 60 mg, Intravenous, Once, Kale, Cloria Spring, MD   sodium chloride flush (NS) 0.9 % injection 10 mL, 10 mL, Intracatheter, PRN, Brunetta Genera, MD, 10 mL at 09/16/21 1623   sodium chloride flush (NS) 0.9 % injection 10 mL, 10 mL, Intracatheter, PRN, Brunetta Genera, MD, 10 mL at 10/28/21 1258  PHYSICAL EXAM: ECOG FS:1 - Symptomatic but completely ambulatory   T: 99.6    BP: 147/76   HR: 88  Resp: 18   O2:100% on room air Physical Exam Vitals and nursing note reviewed.  Constitutional:      Appearance: He is well-developed. He is not ill-appearing or toxic-appearing.  HENT:     Head: Normocephalic and atraumatic.     Comments: No sinus tenderness    Nose: Nose normal.  Eyes:     General: No scleral icterus.       Right eye: No discharge.        Left eye: No discharge.     Conjunctiva/sclera: Conjunctivae normal.  Neck:     Vascular: No JVD.  Cardiovascular:     Rate and Rhythm: Normal rate and regular rhythm.     Pulses:  Normal pulses.     Heart sounds: Normal heart sounds.  Pulmonary:     Effort: Pulmonary effort is normal.     Breath sounds: Normal breath sounds. No stridor. No rales.     Comments: Faint expiratory wheezing heard in anterior lung fields.  No hypoxia during exam.  Patient able to speak in full sentences without respiratory distress. Chest:     Chest wall: No tenderness.  Abdominal:     General: There is no distension.  Musculoskeletal:        General: Normal range of motion.     Cervical back: Normal range of motion.  Skin:    General: Skin is warm and dry.     Capillary Refill: Capillary refill takes less than 2 seconds.     Findings: No rash.  Neurological:     Mental Status: He is oriented to person, place, and time.     GCS: GCS eye subscore is 4. GCS verbal subscore is 5. GCS motor subscore is 6.     Comments: Fluent speech, no facial droop.  Psychiatric:        Behavior: Behavior normal.       LABORATORY DATA: I have reviewed the data as listed CBC Latest Ref Rng & Units 10/28/2021 10/14/2021 09/30/2021  WBC 4.0 - 10.5 K/uL 5.9 6.7 6.9  Hemoglobin 13.0 - 17.0 g/dL 12.1(L) 11.9(L) 11.3(L)  Hematocrit 39.0 - 52.0 % 35.9(L) 34.8(L) 32.4(L)  Platelets 150 - 400 K/uL 164 237 219     CMP Latest Ref Rng & Units 10/28/2021 10/14/2021 09/30/2021  Glucose 70 - 99 mg/dL 129(H) 115(H) 103(H)  BUN 8 - 23 mg/dL 29(H) 26(H) 22  Creatinine 0.61 - 1.24 mg/dL 2.05(H) 1.58(H) 1.49(H)  Sodium 135 - 145 mmol/L 142 142 141  Potassium 3.5 - 5.1 mmol/L 3.8 4.2 3.9  Chloride 98 - 111 mmol/L 110 110 112(H)  CO2 22 - 32 mmol/L 21(L) 22 23  Calcium 8.9 - 10.3 mg/dL 8.7(L) 8.5(L) 8.5(L)  Total Protein 6.5 - 8.1 g/dL 7.4 6.9 7.1  Total Bilirubin 0.3 - 1.2 mg/dL 0.7 0.5 0.4  Alkaline Phos 38 - 126 U/L 76 72 68  AST 15 - 41 U/L 12(L) 12(L) 17  ALT 0 - 44 U/L 13 13 18        RADIOGRAPHIC STUDIES: I have personally reviewed the radiological images as listed and agreed with the findings in  the report. No images are attached to the encounter. DG Chest 2 View  Result Date: 10/28/2021 CLINICAL DATA:  Cough. EXAM: CHEST - 2 VIEW COMPARISON:  May 28, 2021. FINDINGS: The heart size and mediastinal contours are within normal limits. Both lungs are clear. Right internal jugular Port-A-Cath is noted with tip in expected position of the SVC. The visualized skeletal structures are unremarkable. IMPRESSION: No active cardiopulmonary disease. Electronically Signed   By: Marijo Conception M.D.   On: 10/28/2021 13:53     ASSESSMENT & PLAN: Patient is a 65 y.o. male with history of plasma cell leukemia in remission followed by oncologist Dr. Irene Limbo.  #)cough, body aches- patient tested positive for influenza A today.  Chest x-ray is negative acute infectious processes.  I viewed imaging and agree with radiologist impression.  Patient is immunocompromised and therefore will prescribe Tamiflu.  I viewed patient's lab from today and he has a creatinine of 2.05 and a creatinine clearance of 17 so Tamiflu will be dose reduced to 30 mg once daily.  Discussed other supportive care measures and strict ED precautions.  #) Plasma cell leukemia-patient's treatment was held today because he was not feeling well.  His next treatment is planned for x2 weeks from today. He has appointment scheduled with Dr. Irene Limbo that morning. Patient aware if he is still feeling poorly that treatment will be help as well. Patient also knows to hold his Revlimid until he is feeling better.    Visit Diagnosis: 1. Acute cough   2. Flu   3. Plasma cell leukemia in remission ([place])      Orders Placed This Encounter  Procedures   DG Chest 2 View    Standing Status:   Future    Number of Occurrences:   1    Standing Expiration Date:   10/28/2022    Order Specific Question:   Reason for Exam (SYMPTOM  OR DIAGNOSIS REQUIRED)    Answer:   cough, body aches    Order Specific Question:   Preferred imaging location?    Answer:   [facility]    All questions were answered. The patient knows to call the clinic with any problems, questions or concerns. No barriers to learning was detected.  I have spent a total of 20 minutes minutes of face-to-face and non-face-to-face time, preparing to see the patient, obtaining and/or reviewing separately obtained history, performing a medically appropriate examination, counseling and educating the patient, ordering tests,  documenting clinical information in the electronic health record, and care coordination.     Thank you for allowing me to participate in the care of this patient.    Barrie Folk, PA-C Department of Hematology/Oncology [facility] at [facility] Phone: [phone]  [phone]    10/28/2021 3:40 PM

## 2021-10-31 LAB — KAPPA/LAMBDA LIGHT CHAINS
Kappa free light chain: 58.6 mg/L — ABNORMAL HIGH (ref 3.3–19.4)
Kappa, lambda light chain ratio: 1.84 — ABNORMAL HIGH (ref 0.26–1.65)
Lambda free light chains: 31.8 mg/L — ABNORMAL HIGH (ref 5.7–26.3)

## 2021-11-01 LAB — MULTIPLE MYELOMA PANEL, SERUM
Albumin SerPl Elph-Mcnc: 3.5 g/dL (ref 2.9–4.4)
Albumin/Glob SerPl: 1.1 (ref 0.7–1.7)
Alpha 1: 0.4 g/dL (ref 0.0–0.4)
Alpha2 Glob SerPl Elph-Mcnc: 1.2 g/dL — ABNORMAL HIGH (ref 0.4–1.0)
B-Globulin SerPl Elph-Mcnc: 0.9 g/dL (ref 0.7–1.3)
Gamma Glob SerPl Elph-Mcnc: 0.8 g/dL (ref 0.4–1.8)
Globulin, Total: 3.2 g/dL (ref 2.2–3.9)
IgA: 115 mg/dL (ref 61–437)
IgG (Immunoglobin G), Serum: 868 mg/dL (ref 603–1613)
IgM (Immunoglobulin M), Srm: 11 mg/dL — ABNORMAL LOW (ref 20–172)
Total Protein ELP: 6.7 g/dL (ref 6.0–8.5)

## 2021-11-10 MED FILL — Dexamethasone Sodium Phosphate Inj 100 MG/10ML: INTRAMUSCULAR | Qty: 2 | Status: AC

## 2021-11-11 ENCOUNTER — Inpatient Hospital Stay: Payer: BC Managed Care – PPO | Attending: Hematology

## 2021-11-11 ENCOUNTER — Inpatient Hospital Stay: Payer: BC Managed Care – PPO

## 2021-11-11 ENCOUNTER — Other Ambulatory Visit: Payer: Self-pay

## 2021-11-11 VITALS — BP 128/72 | HR 67 | Temp 98.4°F | Resp 18

## 2021-11-11 DIAGNOSIS — C9011 Plasma cell leukemia in remission: Secondary | ICD-10-CM | POA: Diagnosis not present

## 2021-11-11 DIAGNOSIS — Z5111 Encounter for antineoplastic chemotherapy: Secondary | ICD-10-CM

## 2021-11-11 DIAGNOSIS — C9010 Plasma cell leukemia not having achieved remission: Secondary | ICD-10-CM

## 2021-11-11 DIAGNOSIS — Z5112 Encounter for antineoplastic immunotherapy: Secondary | ICD-10-CM | POA: Diagnosis present

## 2021-11-11 DIAGNOSIS — Z95828 Presence of other vascular implants and grafts: Secondary | ICD-10-CM

## 2021-11-11 DIAGNOSIS — Z7189 Other specified counseling: Secondary | ICD-10-CM

## 2021-11-11 LAB — CBC WITH DIFFERENTIAL/PLATELET
Abs Immature Granulocytes: 0.02 10*3/uL (ref 0.00–0.07)
Basophils Absolute: 0.1 10*3/uL (ref 0.0–0.1)
Basophils Relative: 2 %
Eosinophils Absolute: 0.1 10*3/uL (ref 0.0–0.5)
Eosinophils Relative: 2 %
HCT: 34 % — ABNORMAL LOW (ref 39.0–52.0)
Hemoglobin: 11.5 g/dL — ABNORMAL LOW (ref 13.0–17.0)
Immature Granulocytes: 0 %
Lymphocytes Relative: 32 %
Lymphs Abs: 2 10*3/uL (ref 0.7–4.0)
MCH: 31.3 pg (ref 26.0–34.0)
MCHC: 33.8 g/dL (ref 30.0–36.0)
MCV: 92.6 fL (ref 80.0–100.0)
Monocytes Absolute: 0.5 10*3/uL (ref 0.1–1.0)
Monocytes Relative: 7 %
Neutro Abs: 3.5 10*3/uL (ref 1.7–7.7)
Neutrophils Relative %: 57 %
Platelets: 236 10*3/uL (ref 150–400)
RBC: 3.67 MIL/uL — ABNORMAL LOW (ref 4.22–5.81)
RDW: 14.2 % (ref 11.5–15.5)
WBC: 6.3 10*3/uL (ref 4.0–10.5)
nRBC: 0 % (ref 0.0–0.2)

## 2021-11-11 LAB — CMP (CANCER CENTER ONLY)
ALT: 13 U/L (ref 0–44)
AST: 10 U/L — ABNORMAL LOW (ref 15–41)
Albumin: 3.4 g/dL — ABNORMAL LOW (ref 3.5–5.0)
Alkaline Phosphatase: 93 U/L (ref 38–126)
Anion gap: 9 (ref 5–15)
BUN: 24 mg/dL — ABNORMAL HIGH (ref 8–23)
CO2: 21 mmol/L — ABNORMAL LOW (ref 22–32)
Calcium: 9 mg/dL (ref 8.9–10.3)
Chloride: 111 mmol/L (ref 98–111)
Creatinine: 1.67 mg/dL — ABNORMAL HIGH (ref 0.61–1.24)
GFR, Estimated: 45 mL/min — ABNORMAL LOW (ref >60)
Glucose, Bld: 113 mg/dL — ABNORMAL HIGH (ref 70–99)
Potassium: 4.1 mmol/L (ref 3.5–5.1)
Sodium: 141 mmol/L (ref 135–145)
Total Bilirubin: 0.4 mg/dL (ref 0.3–1.2)
Total Protein: 7.4 g/dL (ref 6.5–8.1)

## 2021-11-11 MED ORDER — FAMOTIDINE 20 MG PO TABS
20.0000 mg | ORAL_TABLET | Freq: Once | ORAL | Status: AC
  Administered 2021-11-11: 20 mg via ORAL
  Filled 2021-11-11: qty 1

## 2021-11-11 MED ORDER — SODIUM CHLORIDE 0.9 % IV SOLN: INTRAVENOUS | Status: AC

## 2021-11-11 MED ORDER — SODIUM CHLORIDE 0.9% FLUSH
10.0000 mL | INTRAVENOUS | Status: DC | PRN
  Administered 2021-11-11: 10 mL

## 2021-11-11 MED ORDER — SODIUM CHLORIDE 0.9 % IV SOLN
20.0000 mg | Freq: Once | INTRAVENOUS | Status: AC
  Administered 2021-11-11: 20 mg via INTRAVENOUS
  Filled 2021-11-11: qty 20

## 2021-11-11 MED ORDER — HEPARIN SOD (PORK) LOCK FLUSH 100 UNIT/ML IV SOLN
500.0000 [IU] | Freq: Once | INTRAVENOUS | Status: AC | PRN
  Administered 2021-11-11: 500 [IU]

## 2021-11-11 MED ORDER — SODIUM CHLORIDE 0.9% FLUSH
10.0000 mL | INTRAVENOUS | Status: AC | PRN
  Administered 2021-11-11: 10 mL

## 2021-11-11 MED ORDER — SODIUM CHLORIDE 0.9 % IV SOLN: Freq: Once | INTRAVENOUS | Status: DC

## 2021-11-11 MED ORDER — DEXTROSE 5 % IV SOLN
56.0000 mg/m2 | Freq: Once | INTRAVENOUS | Status: AC
  Administered 2021-11-11: 100 mg via INTRAVENOUS
  Filled 2021-11-11: qty 30

## 2021-11-11 MED ORDER — SODIUM CHLORIDE 0.9 % IV SOLN: Freq: Once | INTRAVENOUS | Status: AC

## 2021-11-11 MED ORDER — ACETAMINOPHEN 325 MG PO TABS
650.0000 mg | ORAL_TABLET | Freq: Once | ORAL | Status: AC
  Administered 2021-11-11: 650 mg via ORAL
  Filled 2021-11-11: qty 2

## 2021-11-11 NOTE — Patient Instructions (Signed)
[facility]  Discharge Instructions: Thank you for choosing [facility] to provide your oncology and hematology care.   If you have a lab appointment with the Cancer Center, please go directly to the Cancer Center and check in at the registration area.   Wear comfortable clothing and clothing appropriate for easy access to any Portacath or PICC line.   We strive to give you quality time with your provider. You may need to reschedule your appointment if you arrive late (15 or more minutes).  Arriving late affects you and other patients whose appointments are after yours.  Also, if you miss three or more appointments without notifying the office, you may be dismissed from the clinic at the provider's discretion.      For prescription refill requests, have your pharmacy contact our office and allow 72 hours for refills to be completed.    Today you received the following chemotherapy and/or immunotherapy agents: Kyprolis    To help prevent nausea and vomiting after your treatment, we encourage you to take your nausea medication as directed.  BELOW ARE SYMPTOMS THAT SHOULD BE REPORTED IMMEDIATELY: . *FEVER GREATER THAN 100.4 F (38 C) OR HIGHER . *CHILLS OR SWEATING . *NAUSEA AND VOMITING THAT IS NOT CONTROLLED WITH YOUR NAUSEA MEDICATION . *UNUSUAL SHORTNESS OF BREATH . *UNUSUAL BRUISING OR BLEEDING . *URINARY PROBLEMS (pain or burning when urinating, or frequent urination) . *BOWEL PROBLEMS (unusual diarrhea, constipation, pain near the anus) . TENDERNESS IN MOUTH AND THROAT WITH OR WITHOUT PRESENCE OF ULCERS (sore throat, sores in mouth, or a toothache) . UNUSUAL RASH, SWELLING OR PAIN  . UNUSUAL VAGINAL DISCHARGE OR ITCHING   Items with * indicate a potential emergency and should be followed up as soon as possible or go to the Emergency Department if any problems should occur.  Please show the CHEMOTHERAPY ALERT CARD or IMMUNOTHERAPY ALERT  CARD at check-in to the Emergency Department and triage nurse.  Should you have questions after your visit or need to cancel or reschedule your appointment, please contact [facility]  Dept: [phone]  and follow the prompts.  Office hours are 8:00 a.m. to 4:30 p.m. Monday - Friday. Please note that voicemails left after 4:00 p.m. may not be returned until the following business day.  We are closed weekends and major holidays. You have access to a nurse at all times for urgent questions. Please call the main number to the clinic Dept: [phone] and follow the prompts.   For any non-urgent questions, you may also contact your provider using MyChart. We now offer e-Visits for anyone 18 and older to request care online for non-urgent symptoms. For details visit [email or web address].   Also download the MyChart app! Go to the app store, search "MyChart", open the app, select [place], and log in with your MyChart username and password.  Due to Covid, a mask is required upon entering the hospital/clinic. If you do not have a mask, one will be given to you upon arrival. For doctor visits, patients may have 1 support person aged 18 or older with them. For treatment visits, patients cannot have anyone with them due to current Covid guidelines and our immunocompromised population.   

## 2021-11-11 NOTE — Progress Notes (Signed)
Ok to treat today with creatinine 1.67 per Dr. Irene Limbo

## 2021-11-11 NOTE — Progress Notes (Signed)
Dr Irene Limbo aware of pt's labs today. CR is at pt's baseline. Pt is ok for tx today

## 2021-11-24 MED FILL — Dexamethasone Sodium Phosphate Inj 100 MG/10ML: INTRAMUSCULAR | Qty: 2 | Status: AC

## 2021-11-25 ENCOUNTER — Inpatient Hospital Stay (HOSPITAL_BASED_OUTPATIENT_CLINIC_OR_DEPARTMENT_OTHER): Payer: BC Managed Care – PPO | Admitting: Hematology

## 2021-11-25 ENCOUNTER — Other Ambulatory Visit: Payer: Self-pay

## 2021-11-25 ENCOUNTER — Inpatient Hospital Stay: Payer: BC Managed Care – PPO

## 2021-11-25 VITALS — BP 142/81 | HR 73 | Temp 97.9°F | Resp 18 | Ht 68.0 in | Wt 156.8 lb

## 2021-11-25 DIAGNOSIS — Z5112 Encounter for antineoplastic immunotherapy: Secondary | ICD-10-CM | POA: Diagnosis not present

## 2021-11-25 DIAGNOSIS — Z95828 Presence of other vascular implants and grafts: Secondary | ICD-10-CM

## 2021-11-25 DIAGNOSIS — Z7189 Other specified counseling: Secondary | ICD-10-CM

## 2021-11-25 DIAGNOSIS — M84551A Pathological fracture in neoplastic disease, right femur, initial encounter for fracture: Secondary | ICD-10-CM

## 2021-11-25 DIAGNOSIS — C9010 Plasma cell leukemia not having achieved remission: Secondary | ICD-10-CM

## 2021-11-25 DIAGNOSIS — Z5111 Encounter for antineoplastic chemotherapy: Secondary | ICD-10-CM

## 2021-11-25 LAB — CMP (CANCER CENTER ONLY)
ALT: 9 U/L (ref 0–44)
AST: 10 U/L — ABNORMAL LOW (ref 15–41)
Albumin: 3.6 g/dL (ref 3.5–5.0)
Alkaline Phosphatase: 72 U/L (ref 38–126)
Anion gap: 11 (ref 5–15)
BUN: 21 mg/dL (ref 8–23)
CO2: 22 mmol/L (ref 22–32)
Calcium: 8.7 mg/dL — ABNORMAL LOW (ref 8.9–10.3)
Chloride: 110 mmol/L (ref 98–111)
Creatinine: 1.53 mg/dL — ABNORMAL HIGH (ref 0.61–1.24)
GFR, Estimated: 50 mL/min — ABNORMAL LOW (ref >60)
Glucose, Bld: 81 mg/dL (ref 70–99)
Potassium: 4 mmol/L (ref 3.5–5.1)
Sodium: 143 mmol/L (ref 135–145)
Total Bilirubin: 0.3 mg/dL (ref 0.3–1.2)
Total Protein: 7.2 g/dL (ref 6.5–8.1)

## 2021-11-25 LAB — CBC WITH DIFFERENTIAL/PLATELET
Abs Immature Granulocytes: 0.03 10*3/uL (ref 0.00–0.07)
Basophils Absolute: 0 10*3/uL (ref 0.0–0.1)
Basophils Relative: 1 %
Eosinophils Absolute: 0.6 10*3/uL — ABNORMAL HIGH (ref 0.0–0.5)
Eosinophils Relative: 8 %
HCT: 34.1 % — ABNORMAL LOW (ref 39.0–52.0)
Hemoglobin: 11.7 g/dL — ABNORMAL LOW (ref 13.0–17.0)
Immature Granulocytes: 0 %
Lymphocytes Relative: 25 %
Lymphs Abs: 1.8 10*3/uL (ref 0.7–4.0)
MCH: 32.4 pg (ref 26.0–34.0)
MCHC: 34.3 g/dL (ref 30.0–36.0)
MCV: 94.5 fL (ref 80.0–100.0)
Monocytes Absolute: 0.7 10*3/uL (ref 0.1–1.0)
Monocytes Relative: 9 %
Neutro Abs: 3.9 10*3/uL (ref 1.7–7.7)
Neutrophils Relative %: 57 %
Platelets: 213 10*3/uL (ref 150–400)
RBC: 3.61 MIL/uL — ABNORMAL LOW (ref 4.22–5.81)
RDW: 15.9 % — ABNORMAL HIGH (ref 11.5–15.5)
WBC: 6.9 10*3/uL (ref 4.0–10.5)
nRBC: 0 % (ref 0.0–0.2)

## 2021-11-25 MED ORDER — HEPARIN SOD (PORK) LOCK FLUSH 100 UNIT/ML IV SOLN
500.0000 [IU] | Freq: Once | INTRAVENOUS | Status: AC | PRN
  Administered 2021-11-25: 500 [IU]

## 2021-11-25 MED ORDER — DEXTROSE 5 % IV SOLN
56.0000 mg/m2 | Freq: Once | INTRAVENOUS | Status: AC
  Administered 2021-11-25: 100 mg via INTRAVENOUS
  Filled 2021-11-25: qty 15

## 2021-11-25 MED ORDER — SODIUM CHLORIDE 0.9 % IV SOLN
20.0000 mg | Freq: Once | INTRAVENOUS | Status: AC
  Administered 2021-11-25: 20 mg via INTRAVENOUS
  Filled 2021-11-25: qty 20

## 2021-11-25 MED ORDER — FAMOTIDINE 20 MG PO TABS
20.0000 mg | ORAL_TABLET | Freq: Once | ORAL | Status: AC
  Administered 2021-11-25: 20 mg via ORAL
  Filled 2021-11-25: qty 1

## 2021-11-25 MED ORDER — SODIUM CHLORIDE 0.9% FLUSH
10.0000 mL | INTRAVENOUS | Status: DC | PRN
  Administered 2021-11-25: 10 mL

## 2021-11-25 MED ORDER — SODIUM CHLORIDE 0.9% FLUSH
10.0000 mL | INTRAVENOUS | Status: AC | PRN
  Administered 2021-11-25: 10 mL

## 2021-11-25 MED ORDER — ACETAMINOPHEN 325 MG PO TABS
650.0000 mg | ORAL_TABLET | Freq: Once | ORAL | Status: AC
  Administered 2021-11-25: 650 mg via ORAL
  Filled 2021-11-25: qty 2

## 2021-11-25 MED ORDER — SODIUM CHLORIDE 0.9 % IV SOLN: Freq: Once | INTRAVENOUS | Status: AC

## 2021-11-25 MED ORDER — SODIUM CHLORIDE 0.9 % IV SOLN
60.0000 mg | Freq: Once | INTRAVENOUS | Status: AC
  Administered 2021-11-25: 60 mg via INTRAVENOUS
  Filled 2021-11-25: qty 10

## 2021-11-25 NOTE — Patient Instructions (Signed)
[facility]COL[facility]   Discharge Instructions: Thank you for choosing [facility] to provide your oncology and hematology care.   If you have a lab appointment with the [facility], please go directly to the [facility] and check in at the registration area.   Wear comfortable clothing and clothing appropriate for easy access to any Portacath or PICC line.   We strive to give you quality time with your provider. You may need to reschedule your appointment if you arrive late (15 or more minutes).  Arriving late affects you and other patients whose appointments are after yours.  Also, if you miss three or more appointments without notifying the office, you may be dismissed from the clinic at the providers discretion.      For prescription refill requests, have your pharmacy contact our office and allow 72 hours for refills to be completed.    Today you received the following chemotherapy and/or immunotherapy agents: Carfilzomib (Kyprolis) and Pamidronate (Aredia)      To help prevent nausea and vomiting after your treatment, we encourage you to take your nausea medication as directed.  BELOW ARE SYMPTOMS THAT SHOULD BE REPORTED IMMEDIATELY: *FEVER GREATER THAN 100.4 F (38 C) OR HIGHER *CHILLS OR SWEATING *NAUSEA AND VOMITING THAT IS NOT CONTROLLED WITH YOUR NAUSEA MEDICATION *UNUSUAL SHORTNESS OF BREATH *UNUSUAL BRUISING OR BLEEDING *URINARY PROBLEMS (pain or burning when urinating, or frequent urination) *BOWEL PROBLEMS (unusual diarrhea, constipation, pain near the anus) TENDERNESS IN MOUTH AND THROAT WITH OR WITHOUT PRESENCE OF ULCERS (sore throat, sores in mouth, or a toothache) UNUSUAL RASH, SWELLING OR PAIN  UNUSUAL VAGINAL DISCHARGE OR ITCHING   Items with * indicate a potential emergency and should be followed up as soon as possible or go to the Emergency Department if any problems should occur.  Please show the CHEMOTHERAPY ALERT CARD or  IMMUNOTHERAPY ALERT CARD at check-in to the Emergency Department and triage nurse.  Should you have questions after your visit or need to cancel or reschedule your appointment, please contact [facility]  Dept: [phone]  and follow the prompts.  Office hours are 8:00 a.m. to 4:30 p.m. Monday - Friday. Please note that voicemails left after 4:00 p.m. may not be returned until the following business day.  We are closed weekends and major holidays. You have access to a nurse at all times for urgent questions. Please call the main number to the clinic Dept: [phone] and follow the prompts.   For any non-urgent questions, you may also contact your provider using MyChart. We now offer e-Visits for anyone 55 and older to request care online for non-urgent symptoms. For details visit [email or web address].   Also download the MyChart app! Go to the app store, search "MyChart", open the app, select [place], and log in with your MyChart username and password.  Due to Covid, a mask is required upon entering the hospital/clinic. If you do not have a mask, one will be given to you upon arrival. For doctor visits, patients may have 1 support person aged 36 or older with them. For treatment visits, patients cannot have anyone with them due to current Covid guidelines and our immunocompromised population.

## 2021-11-25 NOTE — Progress Notes (Signed)
Per Dr. Irene Limbo - okay to proceed with treatment today, despite abnormal labs.

## 2021-11-25 NOTE — Progress Notes (Addendum)
Ok to proceed with Aredia today with mild hypocalcemia.  Ca 8.7, Alb 3.6   Andres Fernandez, [place], BCPS, BCOP 11/25/2021 10:52 AM

## 2021-11-29 ENCOUNTER — Telehealth: Payer: Self-pay | Admitting: Hematology

## 2021-11-29 NOTE — Telephone Encounter (Signed)
Scheduled follow-up appointments per 12/16 los. Patient is aware. Mailed calendar.

## 2021-12-06 ENCOUNTER — Telehealth: Payer: Self-pay | Admitting: *Deleted

## 2021-12-06 NOTE — Telephone Encounter (Signed)
[facility] called requesting refill for Revlimid.  Patient is due January 2nd for refill.  Routing to MD to authorization to refill.

## 2021-12-08 ENCOUNTER — Other Ambulatory Visit: Payer: Self-pay | Admitting: *Deleted

## 2021-12-08 DIAGNOSIS — C9010 Plasma cell leukemia not having achieved remission: Secondary | ICD-10-CM

## 2021-12-08 MED ORDER — LENALIDOMIDE 10 MG PO CAPS: 10.0000 mg | ORAL_CAPSULE | Freq: Every day | ORAL | 0 refills | Status: DC

## 2021-12-09 ENCOUNTER — Inpatient Hospital Stay: Payer: BC Managed Care – PPO

## 2021-12-09 ENCOUNTER — Other Ambulatory Visit: Payer: Self-pay

## 2021-12-09 VITALS — BP 139/87 | HR 72 | Temp 98.0°F | Resp 18 | Wt 155.8 lb

## 2021-12-09 DIAGNOSIS — C9010 Plasma cell leukemia not having achieved remission: Secondary | ICD-10-CM

## 2021-12-09 DIAGNOSIS — Z5111 Encounter for antineoplastic chemotherapy: Secondary | ICD-10-CM

## 2021-12-09 DIAGNOSIS — Z5112 Encounter for antineoplastic immunotherapy: Secondary | ICD-10-CM | POA: Diagnosis not present

## 2021-12-09 DIAGNOSIS — Z7189 Other specified counseling: Secondary | ICD-10-CM

## 2021-12-09 LAB — CMP (CANCER CENTER ONLY)
ALT: 12 U/L (ref 0–44)
AST: 11 U/L — ABNORMAL LOW (ref 15–41)
Albumin: 3.9 g/dL (ref 3.5–5.0)
Alkaline Phosphatase: 58 U/L (ref 38–126)
Anion gap: 7 (ref 5–15)
BUN: 24 mg/dL — ABNORMAL HIGH (ref 8–23)
CO2: 25 mmol/L (ref 22–32)
Calcium: 8.8 mg/dL — ABNORMAL LOW (ref 8.9–10.3)
Chloride: 109 mmol/L (ref 98–111)
Creatinine: 1.53 mg/dL — ABNORMAL HIGH (ref 0.61–1.24)
GFR, Estimated: 50 mL/min — ABNORMAL LOW (ref >60)
Glucose, Bld: 103 mg/dL — ABNORMAL HIGH (ref 70–99)
Potassium: 4.1 mmol/L (ref 3.5–5.1)
Sodium: 141 mmol/L (ref 135–145)
Total Bilirubin: 0.4 mg/dL (ref 0.3–1.2)
Total Protein: 7 g/dL (ref 6.5–8.1)

## 2021-12-09 LAB — CBC WITH DIFFERENTIAL/PLATELET
Abs Immature Granulocytes: 0.02 10*3/uL (ref 0.00–0.07)
Basophils Absolute: 0.1 10*3/uL (ref 0.0–0.1)
Basophils Relative: 2 %
Eosinophils Absolute: 0.2 10*3/uL (ref 0.0–0.5)
Eosinophils Relative: 5 %
HCT: 33.6 % — ABNORMAL LOW (ref 39.0–52.0)
Hemoglobin: 11.3 g/dL — ABNORMAL LOW (ref 13.0–17.0)
Immature Granulocytes: 0 %
Lymphocytes Relative: 28 %
Lymphs Abs: 1.4 10*3/uL (ref 0.7–4.0)
MCH: 31.8 pg (ref 26.0–34.0)
MCHC: 33.6 g/dL (ref 30.0–36.0)
MCV: 94.6 fL (ref 80.0–100.0)
Monocytes Absolute: 0.8 10*3/uL (ref 0.1–1.0)
Monocytes Relative: 15 %
Neutro Abs: 2.6 10*3/uL (ref 1.7–7.7)
Neutrophils Relative %: 50 %
Platelets: 217 10*3/uL (ref 150–400)
RBC: 3.55 MIL/uL — ABNORMAL LOW (ref 4.22–5.81)
RDW: 16.5 % — ABNORMAL HIGH (ref 11.5–15.5)
WBC: 5.1 10*3/uL (ref 4.0–10.5)
nRBC: 0 % (ref 0.0–0.2)

## 2021-12-09 MED ORDER — DEXTROSE 5 % IV SOLN
56.0000 mg/m2 | Freq: Once | INTRAVENOUS | Status: AC
  Administered 2021-12-09: 11:00:00 100 mg via INTRAVENOUS
  Filled 2021-12-09: qty 30

## 2021-12-09 MED ORDER — SODIUM CHLORIDE 0.9% FLUSH: 10.0000 mL | INTRAVENOUS | Status: DC | PRN

## 2021-12-09 MED ORDER — FAMOTIDINE 20 MG PO TABS
20.0000 mg | ORAL_TABLET | Freq: Once | ORAL | Status: AC
  Administered 2021-12-09: 10:00:00 20 mg via ORAL
  Filled 2021-12-09: qty 1

## 2021-12-09 MED ORDER — SODIUM CHLORIDE 0.9 % IV SOLN: Freq: Once | INTRAVENOUS | Status: DC

## 2021-12-09 MED ORDER — SODIUM CHLORIDE 0.9 % IV SOLN
20.0000 mg | Freq: Once | INTRAVENOUS | Status: AC
  Administered 2021-12-09: 10:00:00 20 mg via INTRAVENOUS
  Filled 2021-12-09: qty 20

## 2021-12-09 MED ORDER — HEPARIN SOD (PORK) LOCK FLUSH 100 UNIT/ML IV SOLN: 500.0000 [IU] | Freq: Once | INTRAVENOUS | Status: DC | PRN

## 2021-12-09 MED ORDER — ACETAMINOPHEN 325 MG PO TABS
650.0000 mg | ORAL_TABLET | Freq: Once | ORAL | Status: AC
  Administered 2021-12-09: 10:00:00 650 mg via ORAL
  Filled 2021-12-09: qty 2

## 2021-12-09 MED ORDER — SODIUM CHLORIDE 0.9 % IV SOLN: Freq: Once | INTRAVENOUS | Status: AC

## 2021-12-09 MED ORDER — SODIUM CHLORIDE 0.9 % IV SOLN: INTRAVENOUS | Status: AC

## 2021-12-09 NOTE — Patient Instructions (Signed)
[facility]  Discharge Instructions: Thank you for choosing [facility] to provide your oncology and hematology care.   If you have a lab appointment with the Cancer Center, please go directly to the Cancer Center and check in at the registration area.   Wear comfortable clothing and clothing appropriate for easy access to any Portacath or PICC line.   We strive to give you quality time with your provider. You may need to reschedule your appointment if you arrive late (15 or more minutes).  Arriving late affects you and other patients whose appointments are after yours.  Also, if you miss three or more appointments without notifying the office, you may be dismissed from the clinic at the provider's discretion.      For prescription refill requests, have your pharmacy contact our office and allow 72 hours for refills to be completed.    Today you received the following chemotherapy and/or immunotherapy agents: Kyprolis    To help prevent nausea and vomiting after your treatment, we encourage you to take your nausea medication as directed.  BELOW ARE SYMPTOMS THAT SHOULD BE REPORTED IMMEDIATELY: . *FEVER GREATER THAN 100.4 F (38 C) OR HIGHER . *CHILLS OR SWEATING . *NAUSEA AND VOMITING THAT IS NOT CONTROLLED WITH YOUR NAUSEA MEDICATION . *UNUSUAL SHORTNESS OF BREATH . *UNUSUAL BRUISING OR BLEEDING . *URINARY PROBLEMS (pain or burning when urinating, or frequent urination) . *BOWEL PROBLEMS (unusual diarrhea, constipation, pain near the anus) . TENDERNESS IN MOUTH AND THROAT WITH OR WITHOUT PRESENCE OF ULCERS (sore throat, sores in mouth, or a toothache) . UNUSUAL RASH, SWELLING OR PAIN  . UNUSUAL VAGINAL DISCHARGE OR ITCHING   Items with * indicate a potential emergency and should be followed up as soon as possible or go to the Emergency Department if any problems should occur.  Please show the CHEMOTHERAPY ALERT CARD or IMMUNOTHERAPY ALERT  CARD at check-in to the Emergency Department and triage nurse.  Should you have questions after your visit or need to cancel or reschedule your appointment, please contact [facility]  Dept: [phone]  and follow the prompts.  Office hours are 8:00 a.m. to 4:30 p.m. Monday - Friday. Please note that voicemails left after 4:00 p.m. may not be returned until the following business day.  We are closed weekends and major holidays. You have access to a nurse at all times for urgent questions. Please call the main number to the clinic Dept: [phone] and follow the prompts.   For any non-urgent questions, you may also contact your provider using MyChart. We now offer e-Visits for anyone 18 and older to request care online for non-urgent symptoms. For details visit [email or web address].   Also download the MyChart app! Go to the app store, search "MyChart", open the app, select [facility], and log in with your MyChart username and password.  Due to Covid, a mask is required upon entering the hospital/clinic. If you do not have a mask, one will be given to you upon arrival. For doctor visits, patients may have 1 support person aged 18 or older with them. For treatment visits, patients cannot have anyone with them due to current Covid guidelines and our immunocompromised population.   

## 2021-12-09 NOTE — Progress Notes (Signed)
Ok to treat with elevated SCR per Dr.Kale

## 2021-12-11 ENCOUNTER — Encounter: Payer: Self-pay | Admitting: Hematology

## 2021-12-11 NOTE — Progress Notes (Signed)
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 His labs today 12/14/2017 show hgb 8.3, platelets 524k. He is doing well overall. He has staples in his right upper leg following a recent surgery and has not been in touch with the surgeons for a f/u of this. He is not receiving physical therapy and is not getting around at home.  He notes that he was not scheduled for a post hospitalization nephrology f/u and has no PCP.  On review of systems, pt reports intermittent fatigue, SOB, pain to the left leg, dizziness, dry mouth and denies fever, chills, night sweats, dysuria and any other accompanying symptoms.   INTERVAL HISTORY:   Andres Fernandez is here for continued evaluation and management of his multiple myeloma and next dose of Carfilzomib. He developed influenza A infection and tested positive on 10/28/2021 and was treated with Tamiflu.  He notes that his respiratory symptoms have since resolved.  Currently no respiratory symptoms fevers chills night sweats or unexpected weight loss. He notes no new toxicities from carfilzomib or Revlimid.   Labs done today reviewed with the patient.  REVIEW OF SYSTEMS:   .10 Point review of Systems was done is negative except as noted above.   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   Tobacco abuse     SURGICAL HISTORY: Past Surgical History:  Procedure Laterality Date   APPENDECTOMY     FEMUR IM NAIL Right 11/20/2017   Procedure: RIGHT FEMORAL INTRAMEDULLARY (IM) NAIL;  Surgeon: Nicholes Stairs, MD;  Location: [facility];  Service: Orthopedics;  Laterality: Right;   IR IMAGING GUIDED PORT INSERTION   08/19/2021    SOCIAL HISTORY: Social History   Socioeconomic History   Marital status: Single    Spouse name: Not on file   Number of children: Not on file   Years of education: Not on file   Highest education level: Not on file  Occupational History   Not on file  Tobacco Use   Smoking status: Every Day    Packs/day: 0.25    Types: Cigarettes    Last attempt to quit: 11/09/2017    Years since quitting: 4.0   Smokeless tobacco: Never   Tobacco comments:    1 cigarette/day as of 09/25/18  Vaping Use   Vaping Use: Never used  Substance and Sexual Activity   Alcohol use: Yes   Drug use: No   Sexual activity: Not on file  Other Topics Concern   Not on file  Social History Narrative   Not on file   Social Determinants of Health   Financial Resource Strain: Not on file  Food Insecurity: Not on file  Transportation Needs: Not on file  Physical Activity: Not on file  Stress: Not on file  Social Connections: Not on file  Intimate Partner Violence: Not on file    FAMILY HISTORY: Family History  Problem Relation Age of Onset   COPD Mother    Liver cancer Mother     ALLERGIES:  has No Known Allergies.  MEDICATIONS:  . Current Outpatient Medications on File Prior to Visit  Medication Sig Dispense Refill   acyclovir (ZOVIRAX) 400  MG tablet Take 1 tablet (400 mg total) by mouth daily. 30 tablet 11   Albuterol Sulfate (PROAIR RESPICLICK) 267 (90 Base) MCG/ACT AEPB Inhale into the lungs.     aspirin EC 81 MG tablet Take 1 tablet (81 mg total) by mouth daily. (Patient taking differently: Take 81 mg by mouth at bedtime.) 60 tablet 11   cholecalciferol (VITAMIN D) 400 units TABS tablet Take 400 Units by mouth at bedtime.      lidocaine-prilocaine (EMLA) cream Apply 1 application topically as needed. Apply to Port-A-Cath at least 1 hr prior to treatment. 30 g 0   Multiple Vitamins-Minerals (MULTIVITAMIN WITH MINERALS) tablet Take 1 tablet by mouth at bedtime.      nicotine  (NICODERM CQ - DOSED IN MG/24 HOURS) 14 mg/24hr patch PLACE 1 PATCH (14 MG TOTAL) ONTO THE SKIN DAILY. 28 patch 1   ondansetron (ZOFRAN) 4 MG tablet Take 2 tablets (8 mg total) by mouth every 8 (eight) hours as needed for nausea. 30 tablet 0   Vitamin D, Ergocalciferol, (DRISDOL) 1.25 MG (50000 UNIT) CAPS capsule Take 1 capsule (50,000 Units total) by mouth once a week. 12 capsule 6   Current Facility-Administered Medications on File Prior to Visit  Medication Dose Route Frequency Provider Last Rate Last Admin   0.9 %  sodium chloride infusion   Intravenous Once Brunetta Genera, MD       sodium chloride flush (NS) 0.9 % injection 10 mL  10 mL Intracatheter PRN Brunetta Genera, MD   10 mL at 09/16/21 1623    PHYSICAL EXAMINATION:  ECOG FS:1 - Symptomatic but completely ambulatory  There were no vitals filed for this visit.   Wt Readings from Last 3 Encounters:  12/09/21 155 lb 12.8 oz (70.7 kg)  11/25/21 156 lb 12 oz (71.1 kg)  09/30/21 156 lb 3.2 oz (70.9 kg)   There is no height or weight on file to calculate BMI.    .. GENERAL:alert, in no acute distress and comfortable SKIN: no acute rashes, no significant lesions EYES: conjunctiva are pink and non-injected, sclera anicteric OROPHARYNX: MMM, no exudates, no oropharyngeal erythema or ulceration NECK: supple, no JVD LYMPH:  no palpable lymphadenopathy in the cervical, axillary or inguinal regions LUNGS: clear to auscultation b/l with normal respiratory effort HEART: regular rate & rhythm ABDOMEN:  normoactive bowel sounds , non tender, not distended. Extremity: no pedal edema PSYCH: alert & oriented x 3 with fluent speech NEURO: no focal motor/sensory deficits  LABORATORY DATA:  I have reviewed the data as listed  CBC Latest Ref Rng & Units 12/09/2021 11/25/2021 11/11/2021  WBC 4.0 - 10.5 K/uL 5.1 6.9 6.3  Hemoglobin 13.0 - 17.0 g/dL 11.3(L) 11.7(L) 11.5(L)  Hematocrit 39.0 - 52.0 % 33.6(L) 34.1(L) 34.0(L)   Platelets 150 - 400 K/uL 217 213 236   CBC    Component Value Date/Time   WBC 5.1 12/09/2021 0924   RBC 3.55 (L) 12/09/2021 0924   HGB 11.3 (L) 12/09/2021 0924   HGB 11.9 (L) 07/08/2021 1009   HGB 8.3 (L) 12/14/2017 1230   HCT 33.6 (L) 12/09/2021 0924   HCT 23.9 (L) 12/14/2017 1230   PLT 217 12/09/2021 0924   PLT 211 07/08/2021 1009   PLT 524 (H) 12/14/2017 1230   MCV 94.6 12/09/2021 0924   MCV 88.4 12/14/2017 1230   MCH 31.8 12/09/2021 0924   MCHC 33.6 12/09/2021 0924   RDW 16.5 (H) 12/09/2021 0924   RDW 15.3 (H) 12/14/2017 1230  LYMPHSABS 1.4 12/09/2021 0924   LYMPHSABS 1.4 12/14/2017 1230   MONOABS 0.8 12/09/2021 0924   MONOABS 0.6 12/14/2017 1230   EOSABS 0.2 12/09/2021 0924   EOSABS 0.1 12/14/2017 1230   BASOSABS 0.1 12/09/2021 0924   BASOSABS 0.1 12/14/2017 1230     CMP Latest Ref Rng & Units 12/09/2021 11/25/2021 11/11/2021  Glucose 70 - 99 mg/dL 103(H) 81 113(H)  BUN 8 - 23 mg/dL 24(H) 21 24(H)  Creatinine 0.61 - 1.24 mg/dL 1.53(H) 1.53(H) 1.67(H)  Sodium 135 - 145 mmol/L 141 143 141  Potassium 3.5 - 5.1 mmol/L 4.1 4.0 4.1  Chloride 98 - 111 mmol/L 109 110 111  CO2 22 - 32 mmol/L 25 22 21(L)  Calcium 8.9 - 10.3 mg/dL 8.8(L) 8.7(L) 9.0  Total Protein 6.5 - 8.1 g/dL 7.0 7.2 7.4  Total Bilirubin 0.3 - 1.2 mg/dL 0.4 0.3 0.4  Alkaline Phos 38 - 126 U/L 58 72 93  AST 15 - 41 U/L 11(L) 10(L) 10(L)  ALT 0 - 44 U/L 12 9 13     03/09/20 ([id number]) Bone Marrow Biopsy at [facility]   07/02/2019 Bone Marrow Biopsy ([id number]) at [facility]    08/07/18 BM Bx:        RADIOGRAPHIC STUDIES: I have personally reviewed the radiological images as listed and agreed with the findings in the report. No results found.  ASSESSMENT & PLAN:    66 y.o. 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   Initial BM Bx showed >95% involvement with kappa restricted serum free light chains. 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 MoL Cy-   08/07/18 BM Bx revealed normocellular bone marrow with trilineage hematopoiesis and 1% plasma cells which indicates the pt is in remission 08/06/18 PET/CT revealed  Expansile lytic lesion involving the left iliac bone, unchanged. Lytic lesion involving the sternum, unchanged. While both are suspicious for myeloma, neither lesion is FDG avid. No hypermetabolic osseous lesions in the visualized axial and appendicular skeleton. Please note that the bilateral lower extremities were not imaged. No suspicious lymphadenopathy. Spleen is normal in size.    Pt began Ninlaro 7m maintenance after pt finished 13 cycles of CyBorD and BM bx showed only 1% plasma cells, then resumed CyBorD and held Ninlaro in anticipation of his tandem bone marrow transplants beginning in late December 2019 [facility]  11/19/18 High dose chemotherapy with Melphalan 1434mm2 and autologous stem cell rescue  03/20/19 High dose chemotherapy with Melphalan 2005m2 and autologous stem cell rescue. Non-infectious diarrhea, uncomplicated course otherwise.  07/02/2019 PET scan with results revealing "1. No FDG avid bone lesions are identified. 2.  Asymmetric uptake within the right, greater than left, palatine tonsils with mild fullness of the right tonsillar soft tissue. ENT consult vs attention on follow up suggested. 3.  Innumerable mixed sclerotic and lytic lesions throughout the axial and appendicular skeleton without associated hypermetabolic activity. 4.  Ancillary CT findings as above."  07/02/2019 bone marrow biopsy ([id number])with results showing: "Trilineage hematopoieses with maturation. No plasma cell myeloma identified. Mild  anemia and No Rouleaux or circulating plasma cells identified in the peripheral blood."  2) s/p Prophylactic IM nailing for rt femur  completed [facility] Therapy at  the [facility].  -patient previously reported improvement and is currently progressively back to full time work.  3) Thrombocytopenia resolved   4) Renal insufficiency -- likely primarily related to Myeloma. stable Plan -maintain follow up with nephrology referral to Dr Marval [facility]al [facility] for continued treatment -Pt completed 8 weeks of maverick for hep c with Dr Linus Salmons on 07/08/18.  -Kidney numbers improved. Creatinine in the 1.7-2.2 range   5) h/o tobacco abuse-counseled on smoking cessation -Currently not smoking.   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 -Hgb improved/stable   -monitor  PLAN: -Labs done today were discussed with the patient.  CBC and CMP stable. -Discussed his recent influenza infection which has now resolved. -No prohibitive toxicities from continuing maintenance carfilzomib and Revlimid at this time. -Recommended taking infection precautions and staying up-to-date with his vaccinations -Continue daily ASA and Vitamin D. -Continue pamidronate every 8 weeks   FOLLOW UP: Plz schedule next 8 doses of every 2 weekly carfilzomib with portflush and labs MD visit in 4 weeks -Continue Pamidronate q8weeks- plz schedule next 3 doses    All of the patient's questions were answered with apparent satisfaction. The patient knows to call the clinic with any problems, questions or concerns.  Sullivan Lone MD [place] AAHIVMS [facility] Hematology/Oncology Physician [facility]

## 2021-12-23 ENCOUNTER — Inpatient Hospital Stay: Payer: BC Managed Care – PPO | Attending: Hematology

## 2021-12-23 ENCOUNTER — Other Ambulatory Visit: Payer: Self-pay

## 2021-12-23 ENCOUNTER — Inpatient Hospital Stay (HOSPITAL_BASED_OUTPATIENT_CLINIC_OR_DEPARTMENT_OTHER): Payer: BC Managed Care – PPO | Admitting: Hematology

## 2021-12-23 ENCOUNTER — Inpatient Hospital Stay: Payer: BC Managed Care – PPO

## 2021-12-23 VITALS — BP 126/80 | HR 77 | Temp 98.2°F | Resp 18 | Wt 155.0 lb

## 2021-12-23 DIAGNOSIS — C9001 Multiple myeloma in remission: Secondary | ICD-10-CM | POA: Insufficient documentation

## 2021-12-23 DIAGNOSIS — C9010 Plasma cell leukemia not having achieved remission: Secondary | ICD-10-CM

## 2021-12-23 DIAGNOSIS — Z5112 Encounter for antineoplastic immunotherapy: Secondary | ICD-10-CM | POA: Insufficient documentation

## 2021-12-23 DIAGNOSIS — C9011 Plasma cell leukemia in remission: Secondary | ICD-10-CM | POA: Insufficient documentation

## 2021-12-23 DIAGNOSIS — Z7189 Other specified counseling: Secondary | ICD-10-CM

## 2021-12-23 DIAGNOSIS — Z95828 Presence of other vascular implants and grafts: Secondary | ICD-10-CM

## 2021-12-23 DIAGNOSIS — Z5111 Encounter for antineoplastic chemotherapy: Secondary | ICD-10-CM

## 2021-12-23 LAB — CMP (CANCER CENTER ONLY)
ALT: 11 U/L (ref 0–44)
AST: 11 U/L — ABNORMAL LOW (ref 15–41)
Albumin: 4.1 g/dL (ref 3.5–5.0)
Alkaline Phosphatase: 70 U/L (ref 38–126)
Anion gap: 9 (ref 5–15)
BUN: 25 mg/dL — ABNORMAL HIGH (ref 8–23)
CO2: 23 mmol/L (ref 22–32)
Calcium: 8.8 mg/dL — ABNORMAL LOW (ref 8.9–10.3)
Chloride: 109 mmol/L (ref 98–111)
Creatinine: 1.59 mg/dL — ABNORMAL HIGH (ref 0.61–1.24)
GFR, Estimated: 48 mL/min — ABNORMAL LOW (ref >60)
Glucose, Bld: 93 mg/dL (ref 70–99)
Potassium: 3.9 mmol/L (ref 3.5–5.1)
Sodium: 141 mmol/L (ref 135–145)
Total Bilirubin: 0.4 mg/dL (ref 0.3–1.2)
Total Protein: 7 g/dL (ref 6.5–8.1)

## 2021-12-23 LAB — CBC WITH DIFFERENTIAL/PLATELET
Abs Immature Granulocytes: 0.02 10*3/uL (ref 0.00–0.07)
Basophils Absolute: 0.1 10*3/uL (ref 0.0–0.1)
Basophils Relative: 2 %
Eosinophils Absolute: 0.4 10*3/uL (ref 0.0–0.5)
Eosinophils Relative: 6 %
HCT: 34.7 % — ABNORMAL LOW (ref 39.0–52.0)
Hemoglobin: 12 g/dL — ABNORMAL LOW (ref 13.0–17.0)
Immature Granulocytes: 0 %
Lymphocytes Relative: 27 %
Lymphs Abs: 1.7 10*3/uL (ref 0.7–4.0)
MCH: 32.4 pg (ref 26.0–34.0)
MCHC: 34.6 g/dL (ref 30.0–36.0)
MCV: 93.8 fL (ref 80.0–100.0)
Monocytes Absolute: 0.5 10*3/uL (ref 0.1–1.0)
Monocytes Relative: 8 %
Neutro Abs: 3.6 10*3/uL (ref 1.7–7.7)
Neutrophils Relative %: 57 %
Platelets: 222 10*3/uL (ref 150–400)
RBC: 3.7 MIL/uL — ABNORMAL LOW (ref 4.22–5.81)
RDW: 16.6 % — ABNORMAL HIGH (ref 11.5–15.5)
WBC: 6.2 10*3/uL (ref 4.0–10.5)
nRBC: 0 % (ref 0.0–0.2)

## 2021-12-23 MED ORDER — SODIUM CHLORIDE 0.9 % IV SOLN
20.0000 mg | Freq: Once | INTRAVENOUS | Status: AC
  Administered 2021-12-23: 20 mg via INTRAVENOUS
  Filled 2021-12-23: qty 20

## 2021-12-23 MED ORDER — DEXTROSE 5 % IV SOLN
56.0000 mg/m2 | Freq: Once | INTRAVENOUS | Status: AC
  Administered 2021-12-23: 100 mg via INTRAVENOUS
  Filled 2021-12-23: qty 30

## 2021-12-23 MED ORDER — SODIUM CHLORIDE 0.9% FLUSH
10.0000 mL | INTRAVENOUS | Status: DC | PRN
  Administered 2021-12-23: 10 mL via INTRAVENOUS

## 2021-12-23 MED ORDER — ACETAMINOPHEN 325 MG PO TABS
650.0000 mg | ORAL_TABLET | Freq: Once | ORAL | Status: AC
  Administered 2021-12-23: 650 mg via ORAL
  Filled 2021-12-23: qty 2

## 2021-12-23 MED ORDER — HEPARIN SOD (PORK) LOCK FLUSH 100 UNIT/ML IV SOLN
500.0000 [IU] | Freq: Once | INTRAVENOUS | Status: AC | PRN
  Administered 2021-12-23: 500 [IU]

## 2021-12-23 MED ORDER — SODIUM CHLORIDE 0.9 % IV SOLN: INTRAVENOUS | Status: AC

## 2021-12-23 MED ORDER — FAMOTIDINE 20 MG PO TABS
20.0000 mg | ORAL_TABLET | Freq: Once | ORAL | Status: AC
  Administered 2021-12-23: 20 mg via ORAL
  Filled 2021-12-23: qty 1

## 2021-12-23 MED ORDER — SODIUM CHLORIDE 0.9 % IV SOLN: Freq: Once | INTRAVENOUS | Status: AC

## 2021-12-23 MED ORDER — SODIUM CHLORIDE 0.9 % IV SOLN: Freq: Once | INTRAVENOUS | Status: DC

## 2021-12-23 MED ORDER — SODIUM CHLORIDE 0.9% FLUSH
10.0000 mL | INTRAVENOUS | Status: DC | PRN
  Administered 2021-12-23: 10 mL

## 2021-12-23 NOTE — Progress Notes (Signed)
Per Dr. Irene Limbo - okay to treat with elevated creatinine.

## 2021-12-23 NOTE — Patient Instructions (Signed)
[facility] MEDICAL ONCOLOGY   Discharge Instructions: Thank you for choosing [facility] to provide your oncology and hematology care.   If you have a lab appointment with the Cancer Center, please go directly to the Cancer Center and check in at the registration area.   Wear comfortable clothing and clothing appropriate for easy access to any Portacath or PICC line.   We strive to give you quality time with your provider. You may need to reschedule your appointment if you arrive late (15 or more minutes).  Arriving late affects you and other patients whose appointments are after yours.  Also, if you miss three or more appointments without notifying the office, you may be dismissed from the clinic at the provider's discretion.      For prescription refill requests, have your pharmacy contact our office and allow 72 hours for refills to be completed.    Today you received the following chemotherapy and/or immunotherapy agents: Carfilzomib (Kyprolis)     To help prevent nausea and vomiting after your treatment, we encourage you to take your nausea medication as directed.  BELOW ARE SYMPTOMS THAT SHOULD BE REPORTED IMMEDIATELY: *FEVER GREATER THAN 100.4 F (38 C) OR HIGHER *CHILLS OR SWEATING *NAUSEA AND VOMITING THAT IS NOT CONTROLLED WITH YOUR NAUSEA MEDICATION *UNUSUAL SHORTNESS OF BREATH *UNUSUAL BRUISING OR BLEEDING *URINARY PROBLEMS (pain or burning when urinating, or frequent urination) *BOWEL PROBLEMS (unusual diarrhea, constipation, pain near the anus) TENDERNESS IN MOUTH AND THROAT WITH OR WITHOUT PRESENCE OF ULCERS (sore throat, sores in mouth, or a toothache) UNUSUAL RASH, SWELLING OR PAIN  UNUSUAL VAGINAL DISCHARGE OR ITCHING   Items with * indicate a potential emergency and should be followed up as soon as possible or go to the Emergency Department if any problems should occur.  Please show the CHEMOTHERAPY ALERT CARD or IMMUNOTHERAPY ALERT CARD  at check-in to the Emergency Department and triage nurse.  Should you have questions after your visit or need to cancel or reschedule your appointment, please contact [facility] MEDICAL ONCOLOG[facility]  Dept: [phone]  and follow the prompts.  Office hours are 8:00 a.m. to 4:30 p.m. Monday - Friday. Please note that voicemails left after 4:00 p.m. may not be returned until the following business day.  We are closed weekends and major holidays. You have access to a nurse at all times for urgent questions. Please call the main number to the clinic Dept: [phone] and follow the prompts.   For any non-urgent questions, you may also contact your provider using MyChart. We now offer e-Visits for anyone 18 and older to request care online for non-urgent symptoms. For details visit [email or web address].   Also download the MyChart app! Go to the app store, search "MyChart", open the app, select , and log in with your MyChart username and password.  Due to Covid, a mask is required upon entering the hospital/clinic. If you do not have a mask, one will be given to you upon arrival. For doctor visits, patients may have 1 support person aged 18 or older with them. For treatment visits, patients cannot have anyone with them due to current Covid guidelines and our immunocompromised population.  

## 2021-12-26 LAB — KAPPA/LAMBDA LIGHT CHAINS
Kappa free light chain: 27.5 mg/L — ABNORMAL HIGH (ref 3.3–19.4)
Kappa, lambda light chain ratio: 1.21 (ref 0.26–1.65)
Lambda free light chains: 22.8 mg/L (ref 5.7–26.3)

## 2021-12-27 ENCOUNTER — Other Ambulatory Visit: Payer: Self-pay

## 2021-12-27 DIAGNOSIS — C9010 Plasma cell leukemia not having achieved remission: Secondary | ICD-10-CM

## 2021-12-27 MED ORDER — LENALIDOMIDE 10 MG PO CAPS: 10.0000 mg | ORAL_CAPSULE | Freq: Every day | ORAL | 0 refills | Status: DC

## 2021-12-28 ENCOUNTER — Telehealth: Payer: Self-pay | Admitting: Hematology

## 2021-12-28 NOTE — Telephone Encounter (Signed)
Unable to leave message with follow-up appointments per 1/13 los. Mailed calendar.

## 2021-12-29 ENCOUNTER — Encounter: Payer: Self-pay | Admitting: Hematology

## 2021-12-29 LAB — MULTIPLE MYELOMA PANEL, SERUM
Albumin SerPl Elph-Mcnc: 3.6 g/dL (ref 2.9–4.4)
Albumin/Glob SerPl: 1.3 (ref 0.7–1.7)
Alpha 1: 0.2 g/dL (ref 0.0–0.4)
Alpha2 Glob SerPl Elph-Mcnc: 1.1 g/dL — ABNORMAL HIGH (ref 0.4–1.0)
B-Globulin SerPl Elph-Mcnc: 0.9 g/dL (ref 0.7–1.3)
Gamma Glob SerPl Elph-Mcnc: 0.8 g/dL (ref 0.4–1.8)
Globulin, Total: 3 g/dL (ref 2.2–3.9)
IgA: 85 mg/dL (ref 61–437)
IgG (Immunoglobin G), Serum: 847 mg/dL (ref 603–1613)
IgM (Immunoglobulin M), Srm: 14 mg/dL — ABNORMAL LOW (ref 20–172)
Total Protein ELP: 6.6 g/dL (ref 6.0–8.5)

## 2021-12-29 NOTE — Progress Notes (Addendum)
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 His labs today 12/14/2017 show hgb 8.3, platelets 524k. He is doing well overall. He has staples in his right upper leg following a recent surgery and has not been in touch with the surgeons for a f/u of this. He is not receiving physical therapy and is not getting around at home.  He notes that he was not scheduled for a post hospitalization nephrology f/u and has no PCP.  On review of systems, pt reports intermittent fatigue, SOB, pain to the left leg, dizziness, dry mouth and denies fever, chills, night sweats, dysuria and any other accompanying symptoms.   INTERVAL HISTORY:   Andres Fernandez is here for follow-up for continued evaluation and management of his plasma cell leukemia/multiple myeloma. He continues to be on aggressive maintenance treatment with carfilzomib and Revlimid. He notes no overt new toxicities from his treatment. No new infection issues since his last clinic visit. No new bone pains. Grade 1 fatigue. Labs done today 12/23/2021 were reviewed with the patient.   REVIEW OF SYSTEMS:   .10 Point review of Systems was done is negative except as noted above.  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   Tobacco abuse     SURGICAL HISTORY: Past Surgical History:  Procedure Laterality Date   APPENDECTOMY     FEMUR IM NAIL Right 11/20/2017   Procedure: RIGHT FEMORAL INTRAMEDULLARY (IM) NAIL;  Surgeon: Nicholes Stairs, MD;  Location: [facility];  Service: Orthopedics;  Laterality: Right;   IR IMAGING GUIDED PORT INSERTION  08/19/2021    SOCIAL  HISTORY: Social History   Socioeconomic History   Marital status: Single    Spouse name: Not on file   Number of children: Not on file   Years of education: Not on file   Highest education level: Not on file  Occupational History   Not on file  Tobacco Use   Smoking status: Every Day    Packs/day: 0.25    Types: Cigarettes    Last attempt to quit: 11/09/2017    Years since quitting: 4.1   Smokeless tobacco: Never   Tobacco comments:    1 cigarette/day as of 09/25/18  Vaping Use   Vaping Use: Never used  Substance and Sexual Activity   Alcohol use: Yes   Drug use: No   Sexual activity: Not on file  Other Topics Concern   Not on file  Social History Narrative   Not on file   Social Determinants of Health   Financial Resource Strain: Not on file  Food Insecurity: Not on file  Transportation Needs: Not on file  Physical Activity: Not on file  Stress: Not on file  Social Connections: Not on file  Intimate Partner Violence: Not on file    FAMILY HISTORY: Family History  Problem Relation Age of Onset   COPD Mother    Liver cancer Mother     ALLERGIES:  has No Known Allergies.  MEDICATIONS:  . Current Outpatient Medications on File Prior to Visit  Medication Sig Dispense Refill   acyclovir (ZOVIRAX) 400 MG tablet Take 1 tablet (400 mg  total) by mouth daily. 30 tablet 11   Albuterol Sulfate (PROAIR RESPICLICK) 401 (90 Base) MCG/ACT AEPB Inhale into the lungs.     aspirin EC 81 MG tablet Take 1 tablet (81 mg total) by mouth daily. (Patient taking differently: Take 81 mg by mouth at bedtime.) 60 tablet 11   cholecalciferol (VITAMIN D) 400 units TABS tablet Take 400 Units by mouth at bedtime.      lidocaine-prilocaine (EMLA) cream Apply 1 application topically as needed. Apply to Port-A-Cath at least 1 hr prior to treatment. 30 g 0   Multiple Vitamins-Minerals (MULTIVITAMIN WITH MINERALS) tablet Take 1 tablet by mouth at bedtime.      nicotine (NICODERM CQ - DOSED IN  MG/24 HOURS) 14 mg/24hr patch PLACE 1 PATCH (14 MG TOTAL) ONTO THE SKIN DAILY. 28 patch 1   ondansetron (ZOFRAN) 4 MG tablet Take 2 tablets (8 mg total) by mouth every 8 (eight) hours as needed for nausea. 30 tablet 0   Vitamin D, Ergocalciferol, (DRISDOL) 1.25 MG (50000 UNIT) CAPS capsule Take 1 capsule (50,000 Units total) by mouth once a week. 12 capsule 6   Current Facility-Administered Medications on File Prior to Visit  Medication Dose Route Frequency Provider Last Rate Last Admin   0.9 %  sodium chloride infusion   Intravenous Once Brunetta Genera, MD       sodium chloride flush (NS) 0.9 % injection 10 mL  10 mL Intracatheter PRN Brunetta Genera, MD   10 mL at 09/16/21 1623    PHYSICAL EXAMINATION:  ECOG FS:1 - Symptomatic but completely ambulatory  Vital signs stable Wt Readings from Last 3 Encounters:  12/23/21 155 lb (70.3 kg)  12/09/21 155 lb 12.8 oz (70.7 kg)  11/25/21 156 lb 12 oz (71.1 kg)   . GENERAL:alert, in no acute distress and comfortable SKIN: no acute rashes, no significant lesions EYES: conjunctiva are pink and non-injected, sclera anicteric OROPHARYNX: MMM, no exudates, no oropharyngeal erythema or ulceration NECK: supple, no JVD LYMPH:  no palpable lymphadenopathy in the cervical, axillary or inguinal regions LUNGS: clear to auscultation b/l with normal respiratory effort HEART: regular rate & rhythm ABDOMEN:  normoactive bowel sounds , non tender, not distended. Extremity: no pedal edema PSYCH: alert & oriented x 3 with fluent speech NEURO: no focal motor/sensory deficits   LABORATORY DATA:  I have reviewed the data as listed  CBC Latest Ref Rng & Units 12/23/2021 12/09/2021 11/25/2021  WBC 4.0 - 10.5 K/uL 6.2 5.1 6.9  Hemoglobin 13.0 - 17.0 g/dL 12.0(L) 11.3(L) 11.7(L)  Hematocrit 39.0 - 52.0 % 34.7(L) 33.6(L) 34.1(L)  Platelets 150 - 400 K/uL 222 217 213   CBC    Component Value Date/Time   WBC 6.2 12/23/2021 0919   RBC 3.70 (L)  12/23/2021 0919   HGB 12.0 (L) 12/23/2021 0919   HGB 11.9 (L) 07/08/2021 1009   HGB 8.3 (L) 12/14/2017 1230   HCT 34.7 (L) 12/23/2021 0919   HCT 23.9 (L) 12/14/2017 1230   PLT 222 12/23/2021 0919   PLT 211 07/08/2021 1009   PLT 524 (H) 12/14/2017 1230   MCV 93.8 12/23/2021 0919   MCV 88.4 12/14/2017 1230   MCH 32.4 12/23/2021 0919   MCHC 34.6 12/23/2021 0919   RDW 16.6 (H) 12/23/2021 0919   RDW 15.3 (H) 12/14/2017 1230   LYMPHSABS 1.7 12/23/2021 0919   LYMPHSABS 1.4 12/14/2017 1230   MONOABS 0.5 12/23/2021 0919   MONOABS 0.6 12/14/2017 1230   EOSABS 0.4 12/23/2021 0919  EOSABS 0.1 12/14/2017 1230   BASOSABS 0.1 12/23/2021 0919   BASOSABS 0.1 12/14/2017 1230     CMP Latest Ref Rng & Units 12/23/2021 12/09/2021 11/25/2021  Glucose 70 - 99 mg/dL 93 103(H) 81  BUN 8 - 23 mg/dL 25(H) 24(H) 21  Creatinine 0.61 - 1.24 mg/dL 1.59(H) 1.53(H) 1.53(H)  Sodium 135 - 145 mmol/L 141 141 143  Potassium 3.5 - 5.1 mmol/L 3.9 4.1 4.0  Chloride 98 - 111 mmol/L 109 109 110  CO2 22 - 32 mmol/L _0 Calcium 8.9 - 10.3 mg/dL 8.8(L) 8.8(L) 8.7(L)  Total Protein 6.5 - 8.1 g/dL 7.0 7.0 7.2  Total Bilirubin 0.3 - 1.2 mg/dL 0.4 0.4 0.3  Alkaline Phos 38 - 126 U/L 70 58 72  AST 15 - 41 U/L 11(L) 11(L) 10(L)  ALT 0 - 44 U/L _1 03/09/20 ([id number]) Bone Marrow Biopsy at [facility]   07/02/2019 Bone Marrow Biopsy ([id number]) at [facility]    08/07/18 BM Bx:        RADIOGRAPHIC STUDIES: I have personally reviewed the radiological images as listed and agreed with the findings in the report. No results found.  ASSESSMENT & PLAN:    66 y.o. 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   Initial  BM Bx showed >95% involvement with kappa restricted serum free light chains. 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 MoL Cy-   08/07/18 BM Bx revealed normocellular bone marrow with trilineage hematopoiesis and 1% plasma cells which indicates the pt is in remission 08/06/18 PET/CT revealed  Expansile lytic lesion involving the left iliac bone, unchanged. Lytic lesion involving the sternum, unchanged. While both are suspicious for myeloma, neither lesion is FDG avid. No hypermetabolic osseous lesions in the visualized axial and appendicular skeleton. Please note that the bilateral lower extremities were not imaged. No suspicious lymphadenopathy. Spleen is normal in size.    Pt began Ninlaro 68m maintenance after pt finished 13 cycles of CyBorD and BM bx showed only 1% plasma cells, then resumed CyBorD and held Ninlaro in anticipation of his tandem bone marrow transplants beginning in late December 2019 [facility]  11/19/18 High dose chemotherapy with Melphalan 1427mm2 and autologous stem cell rescue  03/20/19 High dose chemotherapy with Melphalan 20013m2 and autologous stem cell rescue. Non-infectious diarrhea, uncomplicated course otherwise.  07/02/2019 PET scan with results revealing "1. No FDG avid bone lesions are identified. 2.  Asymmetric uptake within the right, greater than left, palatine tonsils with mild fullness of the right tonsillar soft tissue. ENT consult vs attention on follow up suggested. 3.  Innumerable mixed sclerotic and lytic lesions throughout the axial and appendicular skeleton without associated hypermetabolic activity. 4.  Ancillary CT findings as above."  07/02/2019 bone marrow biopsy ([id number])with results showing: "Trilineage hematopoieses with maturation. No plasma cell myeloma identified. Mild anemia and No Rouleaux or circulating plasma cells identified in the peripheral blood."  2) s/p Prophylactic IM nailing  for rt femur  [facility] previously reported improvement and is currently progressively back to full time work.  3) Thrombocytopenia resolved   4) Renal  insufficiency -- likely primarily related to Myeloma. stable Plan -maintain follow up with nephrology referral to Dr Marval Regal [facility] for continued treatment -Pt completed 8 weeks of maverick for hep c with Dr Linus Salmons on 07/08/18.  -Kidney numbers improved. Creatinine in the 1.7-2.2 range   5) h/o tobacco abuse-counseled on smoking cessation -Currently not smoking.   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 -Hgb improved/stable   -monitor  PLAN: -labs done today were reviewed with the patient and show normal CBC, stable CMP Myeloma panel shows no M spike Serum kappa lambda free light chains show normal ratio No new lab findings or new clinical symptoms/signs suggestive of myeloma progression at this time -Patient reports no prohibitive toxicities from continuing maintenance carfilzomib and Revlimid at this time. -Again reiterated importance of infection precautions. -Carfilzomib orders were reviewed and signed.  We will continue with the same supportive medications. -Revlimid refills were ordered. -Continue daily ASA and Vitamin D. -Continue pamidronate every 8 weeks for maintenance   FOLLOW UP: Plz schedule next 8 doses of every 2 weekly carfilzomib with portflush and labs MD visit in 6 weeks -Continue Pamidronate q8weeks- plz schedule next 3 doses    All of the patient's questions were answered with apparent satisfaction. The patient knows to call the clinic with any problems, questions or concerns.  Sullivan Lone MD MS AAHIVMS [facility] Hematology/Oncology Physician [facility]  .[name]

## 2022-01-05 MED FILL — Dexamethasone Sodium Phosphate Inj 100 MG/10ML: INTRAMUSCULAR | Qty: 2 | Status: AC

## 2022-01-06 ENCOUNTER — Inpatient Hospital Stay: Payer: BC Managed Care – PPO

## 2022-01-06 ENCOUNTER — Other Ambulatory Visit: Payer: Self-pay

## 2022-01-06 VITALS — BP 141/98 | HR 76 | Temp 98.1°F | Resp 16 | Wt 161.4 lb

## 2022-01-06 DIAGNOSIS — Z95828 Presence of other vascular implants and grafts: Secondary | ICD-10-CM

## 2022-01-06 DIAGNOSIS — C9010 Plasma cell leukemia not having achieved remission: Secondary | ICD-10-CM

## 2022-01-06 DIAGNOSIS — Z7189 Other specified counseling: Secondary | ICD-10-CM

## 2022-01-06 DIAGNOSIS — Z5111 Encounter for antineoplastic chemotherapy: Secondary | ICD-10-CM

## 2022-01-06 DIAGNOSIS — Z5112 Encounter for antineoplastic immunotherapy: Secondary | ICD-10-CM | POA: Diagnosis not present

## 2022-01-06 LAB — CMP (CANCER CENTER ONLY)
ALT: 15 U/L (ref 0–44)
AST: 16 U/L (ref 15–41)
Albumin: 4.2 g/dL (ref 3.5–5.0)
Alkaline Phosphatase: 71 U/L (ref 38–126)
Anion gap: 7 (ref 5–15)
BUN: 22 mg/dL (ref 8–23)
CO2: 24 mmol/L (ref 22–32)
Calcium: 9 mg/dL (ref 8.9–10.3)
Chloride: 109 mmol/L (ref 98–111)
Creatinine: 1.5 mg/dL — ABNORMAL HIGH (ref 0.61–1.24)
GFR, Estimated: 52 mL/min — ABNORMAL LOW (ref >60)
Glucose, Bld: 118 mg/dL — ABNORMAL HIGH (ref 70–99)
Potassium: 4.2 mmol/L (ref 3.5–5.1)
Sodium: 140 mmol/L (ref 135–145)
Total Bilirubin: 0.4 mg/dL (ref 0.3–1.2)
Total Protein: 7.1 g/dL (ref 6.5–8.1)

## 2022-01-06 LAB — CBC WITH DIFFERENTIAL/PLATELET
Abs Immature Granulocytes: 0.02 10*3/uL (ref 0.00–0.07)
Basophils Absolute: 0.1 10*3/uL (ref 0.0–0.1)
Basophils Relative: 2 %
Eosinophils Absolute: 0.7 10*3/uL — ABNORMAL HIGH (ref 0.0–0.5)
Eosinophils Relative: 12 %
HCT: 35.1 % — ABNORMAL LOW (ref 39.0–52.0)
Hemoglobin: 11.8 g/dL — ABNORMAL LOW (ref 13.0–17.0)
Immature Granulocytes: 0 %
Lymphocytes Relative: 31 %
Lymphs Abs: 1.8 10*3/uL (ref 0.7–4.0)
MCH: 31.8 pg (ref 26.0–34.0)
MCHC: 33.6 g/dL (ref 30.0–36.0)
MCV: 94.6 fL (ref 80.0–100.0)
Monocytes Absolute: 0.6 10*3/uL (ref 0.1–1.0)
Monocytes Relative: 10 %
Neutro Abs: 2.6 10*3/uL (ref 1.7–7.7)
Neutrophils Relative %: 45 %
Platelets: 228 10*3/uL (ref 150–400)
RBC: 3.71 MIL/uL — ABNORMAL LOW (ref 4.22–5.81)
RDW: 16.4 % — ABNORMAL HIGH (ref 11.5–15.5)
Smear Review: NORMAL
WBC: 5.7 10*3/uL (ref 4.0–10.5)
nRBC: 0 % (ref 0.0–0.2)

## 2022-01-06 MED ORDER — SODIUM CHLORIDE 0.9 % IV SOLN
20.0000 mg | Freq: Once | INTRAVENOUS | Status: AC
  Administered 2022-01-06: 20 mg via INTRAVENOUS
  Filled 2022-01-06: qty 20

## 2022-01-06 MED ORDER — ACETAMINOPHEN 325 MG PO TABS
650.0000 mg | ORAL_TABLET | Freq: Once | ORAL | Status: AC
  Administered 2022-01-06: 650 mg via ORAL
  Filled 2022-01-06: qty 2

## 2022-01-06 MED ORDER — SODIUM CHLORIDE 0.9% FLUSH
10.0000 mL | INTRAVENOUS | Status: DC | PRN
  Administered 2022-01-06: 10 mL

## 2022-01-06 MED ORDER — FAMOTIDINE 20 MG PO TABS
20.0000 mg | ORAL_TABLET | Freq: Once | ORAL | Status: AC
  Administered 2022-01-06: 20 mg via ORAL
  Filled 2022-01-06: qty 1

## 2022-01-06 MED ORDER — DEXTROSE 5 % IV SOLN
56.0000 mg/m2 | Freq: Once | INTRAVENOUS | Status: AC
  Administered 2022-01-06: 100 mg via INTRAVENOUS
  Filled 2022-01-06: qty 30

## 2022-01-06 MED ORDER — SODIUM CHLORIDE 0.9 % IV SOLN: INTRAVENOUS | Status: AC

## 2022-01-06 MED ORDER — SODIUM CHLORIDE 0.9% FLUSH
10.0000 mL | INTRAVENOUS | Status: AC | PRN
  Administered 2022-01-06: 10 mL

## 2022-01-06 MED ORDER — SODIUM CHLORIDE 0.9 % IV SOLN: Freq: Once | INTRAVENOUS | Status: AC

## 2022-01-06 MED ORDER — HEPARIN SOD (PORK) LOCK FLUSH 100 UNIT/ML IV SOLN
500.0000 [IU] | Freq: Once | INTRAVENOUS | Status: AC | PRN
  Administered 2022-01-06: 500 [IU]

## 2022-01-06 NOTE — Patient Instructions (Signed)
[facility] MEDICAL ONCOLOGY   Discharge Instructions: Thank you for choosing [facility] to provide your oncology and hematology care.   If you have a lab appointment with the Cancer Center, please go directly to the Cancer Center and check in at the registration area.   Wear comfortable clothing and clothing appropriate for easy access to any Portacath or PICC line.   We strive to give you quality time with your provider. You may need to reschedule your appointment if you arrive late (15 or more minutes).  Arriving late affects you and other patients whose appointments are after yours.  Also, if you miss three or more appointments without notifying the office, you may be dismissed from the clinic at the provider's discretion.      For prescription refill requests, have your pharmacy contact our office and allow 72 hours for refills to be completed.    Today you received the following chemotherapy and/or immunotherapy agents: Carfilzomib (Kyprolis)     To help prevent nausea and vomiting after your treatment, we encourage you to take your nausea medication as directed.  BELOW ARE SYMPTOMS THAT SHOULD BE REPORTED IMMEDIATELY: *FEVER GREATER THAN 100.4 F (38 C) OR HIGHER *CHILLS OR SWEATING *NAUSEA AND VOMITING THAT IS NOT CONTROLLED WITH YOUR NAUSEA MEDICATION *UNUSUAL SHORTNESS OF BREATH *UNUSUAL BRUISING OR BLEEDING *URINARY PROBLEMS (pain or burning when urinating, or frequent urination) *BOWEL PROBLEMS (unusual diarrhea, constipation, pain near the anus) TENDERNESS IN MOUTH AND THROAT WITH OR WITHOUT PRESENCE OF ULCERS (sore throat, sores in mouth, or a toothache) UNUSUAL RASH, SWELLING OR PAIN  UNUSUAL VAGINAL DISCHARGE OR ITCHING   Items with * indicate a potential emergency and should be followed up as soon as possible or go to the Emergency Department if any problems should occur.  Please show the CHEMOTHERAPY ALERT CARD or IMMUNOTHERAPY ALERT CARD  at check-in to the Emergency Department and triage nurse.  Should you have questions after your visit or need to cancel or reschedule your appointment, please contact [facility] MEDICAL ONCOLOGY  Dept: [phone]  and follow the prompts.  Office hours are 8:00 a.m. to 4:30 p.m. Monday - Friday. Please note that voicemails left after 4:00 p.m. may not be returned until the following business day.  We are closed weekends and major holidays. You have access to a nurse at all times for urgent questions. Please call the main number to the clinic Dept: [phone] and follow the prompts.   For any non-urgent questions, you may also contact your provider using MyChart. We now offer e-Visits for anyone 18 and older to request care online for non-urgent symptoms. For details visit [email or web address].   Also download the MyChart app! Go to the app store, search "MyChart", open the app, select [facility], and log in with your MyChart username and password.  Due to Covid, a mask is required upon entering the hospital/clinic. If you do not have a mask, one will be given to you upon arrival. For doctor visits, patients may have 1 support person aged 18 or older with them. For treatment visits, patients cannot have anyone with them due to current Covid guidelines and our immunocompromised population.  

## 2022-01-09 LAB — KAPPA/LAMBDA LIGHT CHAINS
Kappa free light chain: 31.4 mg/L — ABNORMAL HIGH (ref 3.3–19.4)
Kappa, lambda light chain ratio: 1.41 (ref 0.26–1.65)
Lambda free light chains: 22.3 mg/L (ref 5.7–26.3)

## 2022-01-10 LAB — MULTIPLE MYELOMA PANEL, SERUM
Albumin SerPl Elph-Mcnc: 3.6 g/dL (ref 2.9–4.4)
Albumin/Glob SerPl: 1.2 (ref 0.7–1.7)
Alpha 1: 0.3 g/dL (ref 0.0–0.4)
Alpha2 Glob SerPl Elph-Mcnc: 1.1 g/dL — ABNORMAL HIGH (ref 0.4–1.0)
B-Globulin SerPl Elph-Mcnc: 0.9 g/dL (ref 0.7–1.3)
Gamma Glob SerPl Elph-Mcnc: 0.8 g/dL (ref 0.4–1.8)
Globulin, Total: 3.1 g/dL (ref 2.2–3.9)
IgA: 91 mg/dL (ref 61–437)
IgG (Immunoglobin G), Serum: 826 mg/dL (ref 603–1613)
IgM (Immunoglobulin M), Srm: 11 mg/dL — ABNORMAL LOW (ref 20–172)
Total Protein ELP: 6.7 g/dL (ref 6.0–8.5)

## 2022-01-20 ENCOUNTER — Other Ambulatory Visit: Payer: Self-pay | Admitting: Hematology

## 2022-01-20 ENCOUNTER — Inpatient Hospital Stay: Payer: BC Managed Care – PPO | Attending: Hematology

## 2022-01-20 ENCOUNTER — Inpatient Hospital Stay: Payer: BC Managed Care – PPO

## 2022-01-20 ENCOUNTER — Other Ambulatory Visit: Payer: Self-pay

## 2022-01-20 VITALS — BP 124/79 | HR 73 | Temp 97.7°F | Resp 16 | Wt 159.8 lb

## 2022-01-20 DIAGNOSIS — M84551A Pathological fracture in neoplastic disease, right femur, initial encounter for fracture: Secondary | ICD-10-CM

## 2022-01-20 DIAGNOSIS — C9010 Plasma cell leukemia not having achieved remission: Secondary | ICD-10-CM | POA: Insufficient documentation

## 2022-01-20 DIAGNOSIS — C9000 Multiple myeloma not having achieved remission: Secondary | ICD-10-CM | POA: Diagnosis not present

## 2022-01-20 DIAGNOSIS — Z95828 Presence of other vascular implants and grafts: Secondary | ICD-10-CM

## 2022-01-20 DIAGNOSIS — Z5111 Encounter for antineoplastic chemotherapy: Secondary | ICD-10-CM | POA: Diagnosis not present

## 2022-01-20 DIAGNOSIS — Z7189 Other specified counseling: Secondary | ICD-10-CM

## 2022-01-20 LAB — CMP (CANCER CENTER ONLY)
ALT: 13 U/L (ref 0–44)
AST: 12 U/L — ABNORMAL LOW (ref 15–41)
Albumin: 4 g/dL (ref 3.5–5.0)
Alkaline Phosphatase: 65 U/L (ref 38–126)
Anion gap: 7 (ref 5–15)
BUN: 21 mg/dL (ref 8–23)
CO2: 24 mmol/L (ref 22–32)
Calcium: 8.6 mg/dL — ABNORMAL LOW (ref 8.9–10.3)
Chloride: 110 mmol/L (ref 98–111)
Creatinine: 1.66 mg/dL — ABNORMAL HIGH (ref 0.61–1.24)
GFR, Estimated: 46 mL/min — ABNORMAL LOW (ref >60)
Glucose, Bld: 125 mg/dL — ABNORMAL HIGH (ref 70–99)
Potassium: 3.8 mmol/L (ref 3.5–5.1)
Sodium: 141 mmol/L (ref 135–145)
Total Bilirubin: 0.5 mg/dL (ref 0.3–1.2)
Total Protein: 6.6 g/dL (ref 6.5–8.1)

## 2022-01-20 LAB — CBC WITH DIFFERENTIAL/PLATELET
Abs Immature Granulocytes: 0.03 10*3/uL (ref 0.00–0.07)
Basophils Absolute: 0.1 10*3/uL (ref 0.0–0.1)
Basophils Relative: 2 %
Eosinophils Absolute: 0.4 10*3/uL (ref 0.0–0.5)
Eosinophils Relative: 6 %
HCT: 34.8 % — ABNORMAL LOW (ref 39.0–52.0)
Hemoglobin: 11.6 g/dL — ABNORMAL LOW (ref 13.0–17.0)
Immature Granulocytes: 1 %
Lymphocytes Relative: 23 %
Lymphs Abs: 1.4 10*3/uL (ref 0.7–4.0)
MCH: 31.7 pg (ref 26.0–34.0)
MCHC: 33.3 g/dL (ref 30.0–36.0)
MCV: 95.1 fL (ref 80.0–100.0)
Monocytes Absolute: 0.4 10*3/uL (ref 0.1–1.0)
Monocytes Relative: 7 %
Neutro Abs: 3.7 10*3/uL (ref 1.7–7.7)
Neutrophils Relative %: 61 %
Platelets: 214 10*3/uL (ref 150–400)
RBC: 3.66 MIL/uL — ABNORMAL LOW (ref 4.22–5.81)
RDW: 15.9 % — ABNORMAL HIGH (ref 11.5–15.5)
WBC: 6 10*3/uL (ref 4.0–10.5)
nRBC: 0 % (ref 0.0–0.2)

## 2022-01-20 MED ORDER — FAMOTIDINE 20 MG PO TABS
20.0000 mg | ORAL_TABLET | Freq: Once | ORAL | Status: AC
  Administered 2022-01-20: 20 mg via ORAL
  Filled 2022-01-20: qty 1

## 2022-01-20 MED ORDER — SODIUM CHLORIDE 0.9 % IV SOLN
20.0000 mg | Freq: Once | INTRAVENOUS | Status: AC
  Administered 2022-01-20: 20 mg via INTRAVENOUS
  Filled 2022-01-20: qty 20

## 2022-01-20 MED ORDER — SODIUM CHLORIDE 0.9% FLUSH: 10.0000 mL | INTRAVENOUS | Status: DC | PRN

## 2022-01-20 MED ORDER — SODIUM CHLORIDE 0.9% FLUSH
10.0000 mL | INTRAVENOUS | Status: AC | PRN
  Administered 2022-01-20: 10 mL

## 2022-01-20 MED ORDER — ACETAMINOPHEN 325 MG PO TABS
650.0000 mg | ORAL_TABLET | Freq: Once | ORAL | Status: AC
  Administered 2022-01-20: 650 mg via ORAL
  Filled 2022-01-20: qty 2

## 2022-01-20 MED ORDER — SODIUM CHLORIDE 0.9 % IV SOLN: Freq: Once | INTRAVENOUS | Status: DC

## 2022-01-20 MED ORDER — DEXTROSE 5 % IV SOLN
56.0000 mg/m2 | Freq: Once | INTRAVENOUS | Status: AC
  Administered 2022-01-20: 100 mg via INTRAVENOUS
  Filled 2022-01-20: qty 30

## 2022-01-20 MED ORDER — SODIUM CHLORIDE 0.9 % IV SOLN: Freq: Once | INTRAVENOUS | Status: AC

## 2022-01-20 MED ORDER — SODIUM CHLORIDE 0.9 % IV SOLN
60.0000 mg | Freq: Once | INTRAVENOUS | Status: AC
  Administered 2022-01-20: 60 mg via INTRAVENOUS
  Filled 2022-01-20: qty 10

## 2022-01-20 MED ORDER — HEPARIN SOD (PORK) LOCK FLUSH 100 UNIT/ML IV SOLN: 500.0000 [IU] | Freq: Once | INTRAVENOUS | Status: DC | PRN

## 2022-01-20 MED ORDER — SODIUM CHLORIDE 0.9 % IV SOLN: INTRAVENOUS | Status: AC

## 2022-01-20 NOTE — Patient Instructions (Addendum)
[facility]  Discharge Instructions: Thank you for choosing [facility] to provide your oncology and hematology care.   If you have a lab appointment with the [facility], please go directly to [facility] and check in at the registration area.   Wear comfortable clothing and clothing appropriate for easy access to any Portacath or PICC line.   We strive to give you quality time with your provider. You may need to reschedule your appointment if you arrive late (15 or more minutes).  Arriving late affects you and other patients whose appointments are after yours.  Also, if you miss three or more appointments without notifying the office, you may be dismissed from the clinic at the providers discretion.      For prescription refill requests, have your pharmacy contact our office and allow 72 hours for refills to be completed.    Today you received the following chemotherapy and/or immunotherapy agents: Carfilzomib / Pamidronate     To help prevent nausea and vomiting after your treatment, we encourage you to take your nausea medication as directed.  BELOW ARE SYMPTOMS THAT SHOULD BE REPORTED IMMEDIATELY: *FEVER GREATER THAN 100.4 F (38 C) OR HIGHER *CHILLS OR SWEATING *NAUSEA AND VOMITING THAT IS NOT CONTROLLED WITH YOUR NAUSEA MEDICATION *UNUSUAL SHORTNESS OF BREATH *UNUSUAL BRUISING OR BLEEDING *URINARY PROBLEMS (pain or burning when urinating, or frequent urination) *BOWEL PROBLEMS (unusual diarrhea, constipation, pain near the anus) TENDERNESS IN MOUTH AND THROAT WITH OR WITHOUT PRESENCE OF ULCERS (sore throat, sores in mouth, or a toothache) UNUSUAL RASH, SWELLING OR PAIN  UNUSUAL VAGINAL DISCHARGE OR ITCHING   Items with * indicate a potential emergency and should be followed up as soon as possible or go to the Emergency Department if any problems should occur.  Please show the CHEMOTHERAPY ALERT CARD or IMMUNOTHERAPY ALERT CARD  at check-in to the Emergency Department and triage nurse.  Should you have questions after your visit or need to cancel or reschedule your appointment, please contact [facility]  Dept: [phone]  and follow the prompts.  Office hours are 8:00 a.m. to 4:30 p.m. Monday - Friday. Please note that voicemails left after 4:00 p.m. may not be returned until the following business day.  We are closed weekends and major holidays. You have access to a nurse at all times for urgent questions. Please call the main number to the clinic Dept: [phone] and follow the prompts.   For any non-urgent questions, you may also contact your provider using MyChart. We now offer e-Visits for anyone 20 and older to request care online for non-urgent symptoms. For details visit [email or web address].   Also download the MyChart app! Go to the app store, search "MyChart", open the app, select [facility], and log in with your MyChart username and password.  Due to Covid, a mask is required upon entering the hospital/clinic. If you do not have a mask, one will be given to you upon arrival. For doctor visits, patients may have 1 support Andres Fernandez aged 65 or older with them. For treatment visits, patients cannot have anyone with them due to current Covid guidelines and our immunocompromised population.   Carfilzomib injection What is this medication? CARFILZOMIB (kar FILZ oh mib) targets a specific protein within cancer cells and stops the cancer cells from growing. It is used to treat multiple myeloma. This medicine may be used for other purposes; ask your health care provider or pharmacist if  you have questions. COMMON BRAND NAME(S): KYPROLIS What should I tell my care team before I take this medication? They need to know if you have any of these conditions: heart disease history of blood clots irregular heartbeat kidney disease liver disease lung or breathing disease an unusual or  allergic reaction to carfilzomib, or other medicines, foods, dyes, or preservatives pregnant or trying to get pregnant breast-feeding How should I use this medication? This medicine is for injection or infusion into a vein. It is given by a health care professional in a hospital or clinic setting. Talk to your pediatrician regarding the use of this medicine in children. Special care may be needed. Overdosage: If you think you have taken too much of this medicine contact a poison control center or emergency room at once. NOTE: This medicine is only for you. Do not share this medicine with others. What if I miss a dose? It is important not to miss your dose. Call your doctor or health care professional if you are unable to keep an appointment. What may interact with this medication? Interactions are not expected. This list may not describe all possible interactions. Give your health care provider a list of all the medicines, herbs, non-prescription drugs, or dietary supplements you use. Also tell them if you smoke, drink alcohol, or use illegal drugs. Some items may interact with your medicine. What should I watch for while using this medication? Your condition will be monitored while you are receiving this medicine. You may need blood work done while you are taking this medicine. Do not become pregnant while taking this medicine or for 6 months after stopping it. Women should inform their health care provider if they wish to become pregnant or think they might be pregnant. Men should not father a child while taking this medicine and for 3 months after stopping it. There is a potential for serious side effects to an unborn child. Talk to your health care provider for more information. Do not breast-feed an infant while taking this medicine or for 2 weeks after stopping it. Check with your health care provider if you have severe diarrhea, nausea, and vomiting, or if you sweat a lot. The loss of too  much body fluid may make it dangerous for you to take this medicine. You may get drowsy or dizzy. Do not drive, use machinery, or do anything that needs mental alertness until you know how this medicine affects you. Do not stand up or sit up quickly, especially if you are an older patient. This reduces the risk of dizzy or fainting spells. What side effects may I notice from receiving this medication? Side effects that you should report to your doctor or health care professional as soon as possible: allergic reactions like skin rash, itching or hives, swelling of the face, lips, or tongue confusion dizziness feeling faint or lightheaded fever or chills palpitations seizures signs and symptoms of bleeding such as bloody or black, tarry stools; red or dark-brown urine; spitting up blood or brown material that looks like coffee grounds; red spots on the skin; unusual bruising or bleeding including from the eye, gums, or nose signs and symptoms of a blood clot such as breathing problems; changes in vision; chest pain; severe, sudden headache; pain, swelling, warmth in the leg; trouble speaking; sudden numbness or weakness of the face, arm or leg signs and symptoms of kidney injury like trouble passing urine or change in the amount of urine signs and symptoms of liver injury  like dark yellow or brown urine; general ill feeling or flu-like symptoms; light-colored stools; loss of appetite; nausea; right upper belly pain; unusually weak or tired; yellowing of the eyes or skin Side effects that usually do not require medical attention (report to your doctor or health care professional if they continue or are bothersome): back pain cough diarrhea headache muscle cramps trouble sleeping vomiting This list may not describe all possible side effects. Call your doctor for medical advice about side effects. You may report side effects to FDA at 1-800-FDA-1088. Where should I keep my medication? This drug  is given in a hospital or clinic and will not be stored at home. NOTE: This sheet is a summary. It may not cover all possible information. If you have questions about this medicine, talk to your doctor, pharmacist, or health care provider.  2022 Elsevier/Gold Standard (2021-01-06 00:00:00)  Pamidronate Injection What is this medication? PAMIDRONATE (pa mi DROE nate) slows calcium loss from bones. It treats Paget's disease and high calcium levels in the blood from some kinds of cancer. It may be used in other people at risk for bone loss. This medicine may be used for other purposes; ask your health care provider or pharmacist if you have questions. COMMON BRAND NAME(S): Aredia What should I tell my care team before I take this medication? They need to know if you have any of these conditions: bleeding disorder cancer dental disease kidney disease low levels of calcium or other minerals in the blood low red blood cell counts receiving steroids like dexamethasone or prednisone an unusual or allergic reaction to pamidronate, other drugs, foods, dyes or preservatives pregnant or trying to get pregnant breast-feeding How should I use this medication? This drug is injected into a vein. It is given by a health care provider in a hospital or clinic setting. Talk to your health care provider about the use of this drug in children. Special care may be needed. Overdosage: If you think you have taken too much of this medicine contact a poison control center or emergency room at once. NOTE: This medicine is only for you. Do not share this medicine with others. What if I miss a dose? Keep appointments for follow-up doses. It is important not to miss your dose. Call your health care provider if you are unable to keep an appointment. What may interact with this medication? certain antibiotics given by injection medicines for inflammation or pain like ibuprofen, naproxen some diuretics like  bumetanide, furosemide cyclosporine parathyroid hormone tacrolimus teriparatide thalidomide This list may not describe all possible interactions. Give your health care provider a list of all the medicines, herbs, non-prescription drugs, or dietary supplements you use. Also tell them if you smoke, drink alcohol, or use illegal drugs. Some items may interact with your medicine. What should I watch for while using this medication? Visit your health care provider for regular checks on your progress. It may be some time before you see the benefit from this drug. Some people who take this drug have severe bone, joint, or muscle pain. This drug may also increase your risk for jaw problems or a broken thigh bone. Tell your health care provider right away if you have severe pain in your jaw, bones, joints, or muscles. Tell you health care provider if you have any pain that does not go away or that gets worse. Tell your dentist and dental surgeon that you are taking this drug. You should not have major dental surgery while on  this drug. See your dentist to have a dental exam and fix any dental problems before starting this drug. Take good care of your teeth while on this drug. Make sure you see your dentist for regular follow-up appointments. You should make sure you get enough calcium and vitamin D while you are taking this drug. Discuss the foods you eat and the vitamins you take with your health care provider. You may need blood work done while you are taking this drug. Do not become pregnant while taking this drug. Women should inform their health care provider if they wish to become pregnant or think they might be pregnant. There is potential for serious harm to an unborn child. Talk to your health care provider for more information. What side effects may I notice from receiving this medication? Side effects that you should report to your doctor or health care provider as soon as possible: allergic  reactions (skin rash, itching or hives; swelling of the face, lips, or tongue) bleeding (bloody or black, tarry stools; red or dark brown urine; spitting up blood or brown material that looks like coffee grounds; red spots on the skin; unusual bruising or bleeding from the eyes, gums, or nose) bone pain increased thirst infection (fever, chills, cough, sore throat, pain or trouble passing urine) jaw pain, especially after dental work joint pain kidney injury (trouble passing urine or change in the amount of urine) low calcium levels (fast heartbeat; muscle cramps or pain; pain, tingling, or numbness in the hands or feet; seizures) low magnesium levels (fast, irregular heartbeat; muscle cramp or pain; muscle weakness; tremors; seizures) low potassium levels (trouble breathing; chest pain; dizziness; fast, irregular heartbeat; feeling faint or lightheaded, falls; muscle cramps or pain) muscle pain pain, redness, or irritation at site where injected redness, blistering, peeling, or loosening of the skin, including inside the mouth severe diarrhea unusual sweating Side effects that usually do not require medical attention (report to your doctor or health care provider if they continue or are bothersome): constipation eye irritation, itching, or pain fever headache increase in blood pressure loss of appetite nausea stomach pain unusually weak or tired vomiting This list may not describe all possible side effects. Call your doctor for medical advice about side effects. You may report side effects to FDA at 1-800-FDA-1088. Where should I keep my medication? This drug is given in a hospital or clinic. It will not be stored at home. NOTE: This sheet is a summary. It may not cover all possible information. If you have questions about this medicine, talk to your doctor, pharmacist, or health care provider.  2022 Elsevier/Gold Standard (2021-08-16 00:00:00)

## 2022-01-20 NOTE — Progress Notes (Signed)
Ok to treat with creatinine of 1.66 mg/dL and calcium level of 8.6 per Dr Irene Limbo.

## 2022-02-01 ENCOUNTER — Other Ambulatory Visit: Payer: Self-pay

## 2022-02-01 DIAGNOSIS — C9010 Plasma cell leukemia not having achieved remission: Secondary | ICD-10-CM

## 2022-02-01 MED ORDER — LENALIDOMIDE 10 MG PO CAPS: 10.0000 mg | ORAL_CAPSULE | Freq: Every day | ORAL | 0 refills | Status: DC

## 2022-02-02 MED FILL — Dexamethasone Sodium Phosphate Inj 100 MG/10ML: INTRAMUSCULAR | Qty: 2 | Status: AC

## 2022-02-03 ENCOUNTER — Inpatient Hospital Stay (HOSPITAL_BASED_OUTPATIENT_CLINIC_OR_DEPARTMENT_OTHER): Payer: BC Managed Care – PPO | Admitting: Hematology

## 2022-02-03 ENCOUNTER — Inpatient Hospital Stay: Payer: BC Managed Care – PPO

## 2022-02-03 ENCOUNTER — Other Ambulatory Visit: Payer: Self-pay

## 2022-02-03 VITALS — BP 146/81 | HR 67 | Temp 98.2°F | Resp 17 | Wt 156.0 lb

## 2022-02-03 DIAGNOSIS — Z95828 Presence of other vascular implants and grafts: Secondary | ICD-10-CM

## 2022-02-03 DIAGNOSIS — Z5111 Encounter for antineoplastic chemotherapy: Secondary | ICD-10-CM | POA: Diagnosis not present

## 2022-02-03 DIAGNOSIS — C9010 Plasma cell leukemia not having achieved remission: Secondary | ICD-10-CM

## 2022-02-03 DIAGNOSIS — Z7189 Other specified counseling: Secondary | ICD-10-CM

## 2022-02-03 LAB — CMP (CANCER CENTER ONLY)
ALT: 16 U/L (ref 0–44)
AST: 11 U/L — ABNORMAL LOW (ref 15–41)
Albumin: 4.1 g/dL (ref 3.5–5.0)
Alkaline Phosphatase: 66 U/L (ref 38–126)
Anion gap: 8 (ref 5–15)
BUN: 33 mg/dL — ABNORMAL HIGH (ref 8–23)
CO2: 23 mmol/L (ref 22–32)
Calcium: 8.9 mg/dL (ref 8.9–10.3)
Chloride: 110 mmol/L (ref 98–111)
Creatinine: 1.72 mg/dL — ABNORMAL HIGH (ref 0.61–1.24)
GFR, Estimated: 44 mL/min — ABNORMAL LOW (ref >60)
Glucose, Bld: 132 mg/dL — ABNORMAL HIGH (ref 70–99)
Potassium: 4.1 mmol/L (ref 3.5–5.1)
Sodium: 141 mmol/L (ref 135–145)
Total Bilirubin: 0.5 mg/dL (ref 0.3–1.2)
Total Protein: 7 g/dL (ref 6.5–8.1)

## 2022-02-03 LAB — CBC WITH DIFFERENTIAL/PLATELET
Abs Immature Granulocytes: 0.03 10*3/uL (ref 0.00–0.07)
Basophils Absolute: 0.1 10*3/uL (ref 0.0–0.1)
Basophils Relative: 1 %
Eosinophils Absolute: 0.1 10*3/uL (ref 0.0–0.5)
Eosinophils Relative: 1 %
HCT: 36.6 % — ABNORMAL LOW (ref 39.0–52.0)
Hemoglobin: 12.5 g/dL — ABNORMAL LOW (ref 13.0–17.0)
Immature Granulocytes: 1 %
Lymphocytes Relative: 29 %
Lymphs Abs: 1.8 10*3/uL (ref 0.7–4.0)
MCH: 31.8 pg (ref 26.0–34.0)
MCHC: 34.2 g/dL (ref 30.0–36.0)
MCV: 93.1 fL (ref 80.0–100.0)
Monocytes Absolute: 0.6 10*3/uL (ref 0.1–1.0)
Monocytes Relative: 9 %
Neutro Abs: 3.9 10*3/uL (ref 1.7–7.7)
Neutrophils Relative %: 59 %
Platelets: 230 10*3/uL (ref 150–400)
RBC: 3.93 MIL/uL — ABNORMAL LOW (ref 4.22–5.81)
RDW: 14.9 % (ref 11.5–15.5)
WBC: 6.4 10*3/uL (ref 4.0–10.5)
nRBC: 0 % (ref 0.0–0.2)

## 2022-02-03 MED ORDER — FAMOTIDINE 20 MG PO TABS
20.0000 mg | ORAL_TABLET | Freq: Once | ORAL | Status: AC
  Administered 2022-02-03: 20 mg via ORAL
  Filled 2022-02-03: qty 1

## 2022-02-03 MED ORDER — ACETAMINOPHEN 325 MG PO TABS
650.0000 mg | ORAL_TABLET | Freq: Once | ORAL | Status: AC
  Administered 2022-02-03: 650 mg via ORAL
  Filled 2022-02-03: qty 2

## 2022-02-03 MED ORDER — SODIUM CHLORIDE 0.9 % IV SOLN: INTRAVENOUS | Status: AC

## 2022-02-03 MED ORDER — SODIUM CHLORIDE 0.9 % IV SOLN
20.0000 mg | Freq: Once | INTRAVENOUS | Status: AC
  Administered 2022-02-03: 20 mg via INTRAVENOUS
  Filled 2022-02-03: qty 20

## 2022-02-03 MED ORDER — DEXTROSE 5 % IV SOLN
56.0000 mg/m2 | Freq: Once | INTRAVENOUS | Status: DC
  Filled 2022-02-03: qty 50

## 2022-02-03 MED ORDER — HEPARIN SOD (PORK) LOCK FLUSH 100 UNIT/ML IV SOLN
500.0000 [IU] | Freq: Once | INTRAVENOUS | Status: AC | PRN
  Administered 2022-02-03: 500 [IU]

## 2022-02-03 MED ORDER — SODIUM CHLORIDE 0.9 % IV SOLN: Freq: Once | INTRAVENOUS | Status: AC

## 2022-02-03 MED ORDER — SODIUM CHLORIDE 0.9% FLUSH
10.0000 mL | INTRAVENOUS | Status: AC | PRN
  Administered 2022-02-03: 10 mL

## 2022-02-03 MED ORDER — SODIUM CHLORIDE 0.9% FLUSH
10.0000 mL | INTRAVENOUS | Status: DC | PRN
  Administered 2022-02-03: 10 mL

## 2022-02-03 NOTE — Progress Notes (Signed)
After further review of pt by Dr Irene Limbo, hold treatment d/t recent pneumonia infection.

## 2022-02-03 NOTE — Progress Notes (Signed)
Ok to treat per Dr Irene Limbo with creat 1.72 mg/dL

## 2022-02-09 ENCOUNTER — Encounter: Payer: Self-pay | Admitting: Hematology

## 2022-02-09 NOTE — Progress Notes (Signed)
HEMATOLOGY/ONCOLOGY OUTPATIENT PROGRESS NOTE  Date of Service: .02/03/2022  CC Follow-up for continued evaluation and management of multiple myeloma/plasma cell leukemia  HISTORY OF PRESENTING ILLNESS:  Please see previous note for details of initial presentation.  INTERVAL HISTORY:   Andres Fernandez is here for continued valuation and management of his plasma cell leukemia/multiple myeloma.  He notes that he had upper respiratory infection that started about 2 weeks ago and he is just completing his antibiotics and feels much better but still does not feel 100% and would prefer to hold off on his carfilzomib treatment today.  We discussed that this is reasonable and we would want him to completely recover from his infection. At this point he is not having any fevers chills night sweats shortness of breath or significant cough. Has been having steady improvement in his p.o. intake.  Labs today reviewed with the patient.  REVIEW OF SYSTEMS:   10 Point review of Systems was done is negative except as noted above.  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   Tobacco abuse     SURGICAL HISTORY: Past Surgical History:  Procedure Laterality Date   APPENDECTOMY     FEMUR IM NAIL Right 11/20/2017   Procedure: RIGHT FEMORAL INTRAMEDULLARY (IM) NAIL;  Surgeon: Nicholes Stairs, MD;  Location: [facility];  Service: Orthopedics;  Laterality: Right;   IR IMAGING GUIDED PORT INSERTION  08/19/2021    SOCIAL HISTORY: Social History   Socioeconomic History   Marital status: Single    Spouse name: Not on file   Number of children: Not on file   Years of education: Not on file   Highest education level: Not on file  Occupational History   Not on file  Tobacco Use   Smoking status: Every Day    Packs/day: 0.25    Types: Cigarettes    Last attempt  to quit: 11/09/2017    Years since quitting: 4.2   Smokeless tobacco: Never   Tobacco comments:    1 cigarette/day as of 09/25/18  Vaping Use   Vaping Use: Never used  Substance and Sexual Activity   Alcohol use: Yes   Drug use: No   Sexual activity: Not on file  Other Topics Concern   Not on file  Social History Narrative   Not on file   Social Determinants of Health   Financial Resource Strain: Not on file  Food Insecurity: Not on file  Transportation Needs: Not on file  Physical Activity: Not on file  Stress: Not on file  Social Connections: Not on file  Intimate Partner Violence: Not on file    FAMILY HISTORY: Family History  Problem Relation Age of Onset   COPD Mother    Liver cancer Mother     ALLERGIES:  has No Known Allergies.  MEDICATIONS:  . Current Outpatient Medications on File Prior to Visit  Medication Sig Dispense Refill   acyclovir (ZOVIRAX) 400 MG tablet Take 1 tablet (400 mg total) by mouth daily. 30 tablet 11   Albuterol Sulfate (PROAIR RESPICLICK) 737 (90 Base) MCG/ACT AEPB Inhale into the lungs.     amoxicillin (AMOXIL) 500 MG capsule Take 500 mg by mouth 3 (three) times daily.     aspirin EC 81 MG tablet Take 1 tablet (81 mg total) by mouth daily. (Patient taking differently: Take  81 mg by mouth at bedtime.) 60 tablet 11   cholecalciferol (VITAMIN D) 400 units TABS tablet Take 400 Units by mouth at bedtime.      FLOVENT DISKUS 250 MCG/ACT AEPB Inhale into the lungs.     ipratropium-albuterol (DUONEB) 0.5-2.5 (3) MG/3ML SOLN Inhale into the lungs.     lenalidomide (REVLIMID) 10 MG capsule Take 1 capsule (10 mg total) by mouth daily. Take for 21 days on, 7 days off, repeat every 28 days. 21 capsule 0   lidocaine-prilocaine (EMLA) cream Apply 1 application topically as needed. Apply to Port-A-Cath at least 1 hr prior to treatment. 30 g 0   Multiple Vitamins-Minerals (MULTIVITAMIN WITH MINERALS) tablet Take 1 tablet by mouth at bedtime.       nicotine (NICODERM CQ - DOSED IN MG/24 HOURS) 14 mg/24hr patch PLACE 1 PATCH (14 MG TOTAL) ONTO THE SKIN DAILY. 28 patch 1   ondansetron (ZOFRAN) 4 MG tablet Take 2 tablets (8 mg total) by mouth every 8 (eight) hours as needed for nausea. 30 tablet 0   Vitamin D, Ergocalciferol, (DRISDOL) 1.25 MG (50000 UNIT) CAPS capsule Take 1 capsule (50,000 Units total) by mouth once a week. 12 capsule 6   Current Facility-Administered Medications on File Prior to Visit  Medication Dose Route Frequency Provider Last Rate Last Admin   0.9 %  sodium chloride infusion   Intravenous Once Brunetta Genera, MD       sodium chloride flush (NS) 0.9 % injection 10 mL  10 mL Intracatheter PRN Brunetta Genera, MD   10 mL at 09/16/21 1623    PHYSICAL EXAMINATION:  ECOG FS:1 - Symptomatic but completely ambulatory  Vital signs stable Wt Readings from Last 3 Encounters:  02/03/22 156 lb (70.8 kg)  01/20/22 159 lb 12.8 oz (72.5 kg)  01/06/22 161 lb 6.4 oz (73.2 kg)   NAD GENERAL:alert, in no acute distress and comfortable SKIN: no acute rashes, no significant lesions EYES: conjunctiva are pink and non-injected, sclera anicteric OROPHARYNX: MMM, no exudates, no oropharyngeal erythema or ulceration NECK: supple, no JVD LYMPH:  no palpable lymphadenopathy in the cervical, axillary or inguinal regions LUNGS: clear to auscultation b/l with normal respiratory effort HEART: regular rate & rhythm ABDOMEN:  normoactive bowel sounds , non tender, not distended. Extremity: no pedal edema PSYCH: alert & oriented x 3 with fluent speech NEURO: no focal motor/sensory deficits   LABORATORY DATA:  I have reviewed the data as listed  CBC Latest Ref Rng & Units 02/03/2022 01/20/2022 01/06/2022  WBC 4.0 - 10.5 K/uL 6.4 6.0 5.7  Hemoglobin 13.0 - 17.0 g/dL 12.5(L) 11.6(L) 11.8(L)  Hematocrit 39.0 - 52.0 % 36.6(L) 34.8(L) 35.1(L)  Platelets 150 - 400 K/uL 230 214 228   CBC    Component Value Date/Time   WBC 6.4  02/03/2022 0909   RBC 3.93 (L) 02/03/2022 0909   HGB 12.5 (L) 02/03/2022 0909   HGB 11.9 (L) 07/08/2021 1009   HGB 8.3 (L) 12/14/2017 1230   HCT 36.6 (L) 02/03/2022 0909   HCT 23.9 (L) 12/14/2017 1230   PLT 230 02/03/2022 0909   PLT 211 07/08/2021 1009   PLT 524 (H) 12/14/2017 1230   MCV 93.1 02/03/2022 0909   MCV 88.4 12/14/2017 1230   MCH 31.8 02/03/2022 0909   MCHC 34.2 02/03/2022 0909   RDW 14.9 02/03/2022 0909   RDW 15.3 (H) 12/14/2017 1230   LYMPHSABS 1.8 02/03/2022 0909   LYMPHSABS 1.4 12/14/2017 1230   MONOABS 0.6 02/03/2022  0909   MONOABS 0.6 12/14/2017 1230   EOSABS 0.1 02/03/2022 0909   EOSABS 0.1 12/14/2017 1230   BASOSABS 0.1 02/03/2022 0909   BASOSABS 0.1 12/14/2017 1230     CMP Latest Ref Rng & Units 02/03/2022 01/20/2022 01/06/2022  Glucose 70 - 99 mg/dL 132(H) 125(H) 118(H)  BUN 8 - 23 mg/dL 33(H) 21 22  Creatinine 0.61 - 1.24 mg/dL 1.72(H) 1.66(H) 1.50(H)  Sodium 135 - 145 mmol/L 141 141 140  Potassium 3.5 - 5.1 mmol/L 4.1 3.8 4.2  Chloride 98 - 111 mmol/L 110 110 109  CO2 22 - 32 mmol/L _0 Calcium 8.9 - 10.3 mg/dL 8.9 8.6(L) 9.0  Total Protein 6.5 - 8.1 g/dL 7.0 6.6 7.1  Total Bilirubin 0.3 - 1.2 mg/dL 0.5 0.5 0.4  Alkaline Phos 38 - 126 U/L 66 65 71  AST 15 - 41 U/L 11(L) 12(L) 16  ALT 0 - 44 U/L _1 03/09/20 ([id number]) Bone Marrow Biopsy at [facility]   07/02/2019 Bone Marrow Biopsy ([id number]) at [facility]    08/07/18 BM Bx:        RADIOGRAPHIC STUDIES: I have personally reviewed the radiological images as listed and agreed with the findings in the report. No results found.  ASSESSMENT & PLAN:    66 y.o. 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   Initial BM Bx showed >95% involvement with kappa restricted serum free light chains. 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 MoL Cy-   08/07/18 BM Bx revealed normocellular bone marrow with trilineage hematopoiesis and 1% plasma cells which indicates the pt is in remission 08/06/18 PET/CT revealed  Expansile lytic lesion involving the left iliac bone, unchanged. Lytic lesion involving the sternum, unchanged. While both are suspicious for myeloma, neither lesion is FDG avid. No hypermetabolic osseous lesions in the visualized axial and appendicular skeleton. Please note that the bilateral lower extremities were not imaged. No suspicious lymphadenopathy. Spleen is normal in size.    Pt began Ninlaro 26m maintenance after pt finished 13 cycles of CyBorD and BM bx showed only 1% plasma cells, then resumed CyBorD and held Ninlaro in anticipation of his tandem bone marrow transplants beginning in late December 2019 [facility]  11/19/18 High dose chemotherapy with Melphalan 1432mm2 and autologous stem cell rescue  03/20/19 High dose chemotherapy with Melphalan 20030m2 and autologous stem cell rescue. Non-infectious diarrhea, uncomplicated course otherwise.  07/02/2019 PET scan with results revealing "1. No FDG avid bone lesions are identified. 2.  Asymmetric uptake within the right, greater than left, palatine tonsils with mild fullness of the right tonsillar soft tissue. ENT consult vs attention on follow up suggested. 3.  Innumerable mixed sclerotic and lytic lesions throughout the axial and appendicular skeleton without associated hypermetabolic activity. 4.  Ancillary CT findings as above."  07/02/2019 bone marrow biopsy ([id number])with results showing: "Trilineage hematopoieses with maturation. No plasma cell myeloma identified. Mild anemia and No Rouleaux or circulating plasma cells identified in the peripheral blood."  2)  s/p Prophylactic IM nailing for rt femur  [facility] previously reported improvement and  is currently progressively back to full time work.  3) Thrombocytopenia resolved   4) Renal insufficiency -- likely primarily related to Myeloma. stable Plan -maintain follow up with nephrology referral to Dr Marval [facility] for continued treatment -Pt completed 8 weeks of maverick for hep c with Dr Linus Salmons on 07/08/18.  -Kidney numbers improved. Creatinine in the 1.7-2.2 range   5) h/o tobacco abuse-counseled on smoking cessation -Currently not smoking.   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 -Hgb improved/stable   -monitor  PLAN: Labs done today were reviewed with him in detail. CBC shows normal hemoglobin of 12.5, WBC count of 6.4k and platelets of 230k CMP with stable chronic kidney disease creatinine of 1.72 Last serum free light chains on 01/06/2022 showed normal kappa lambda ratio ratio He currently is recovering from a upper respiratory infection for which he did receive antibiotics from his primary care physician. Currently has no fevers chills or signs of worsening infection.   -We have held his carfilzomib dose today and I will temporarily hold his Revlimid as well for 2 weeks. -He will call us if there are any signs of worsening infection. -We will resume Revlimid and carfilzomib in 2 weeks if all symptoms of infection have resolved. -Continue aspirin and vitamin D -Continue Aredia every 8 weeks for maintenance. -Patient was again counseled on infection precautions.  FOLLOW UP: Hold treatment today (recovering pneumonia) Follow-up for next cycle of carfilzomib in 2 weeks with port flush labs and MD visit  The total time spent in the appointment was 25 minutes*.  All of the patient's questions were answered with apparent  satisfaction. The patient knows to call the clinic with any problems, questions or concerns.   Sullivan Lone MD MS AAHIVMS [facility] Hematology/Oncology Physician [facility]  .*Total Encounter Time as defined by the Centers for Medicare and Medicaid Services includes, in addition to the face-to-face time of a patient visit (documented in the note above) non-face-to-face time: obtaining and reviewing outside history, ordering and reviewing medications, tests or procedures, care coordination (communications with other health care professionals or caregivers) and documentation in the medical record.

## 2022-02-16 MED FILL — Dexamethasone Sodium Phosphate Inj 100 MG/10ML: INTRAMUSCULAR | Qty: 2 | Status: AC

## 2022-02-17 ENCOUNTER — Inpatient Hospital Stay: Payer: BC Managed Care – PPO | Attending: Hematology

## 2022-02-17 ENCOUNTER — Inpatient Hospital Stay: Payer: BC Managed Care – PPO

## 2022-02-17 ENCOUNTER — Other Ambulatory Visit: Payer: Self-pay

## 2022-02-17 VITALS — BP 90/75 | HR 98 | Temp 97.8°F | Resp 18 | Wt 158.8 lb

## 2022-02-17 DIAGNOSIS — M84551A Pathological fracture in neoplastic disease, right femur, initial encounter for fracture: Secondary | ICD-10-CM

## 2022-02-17 DIAGNOSIS — C9010 Plasma cell leukemia not having achieved remission: Secondary | ICD-10-CM

## 2022-02-17 DIAGNOSIS — C9001 Multiple myeloma in remission: Secondary | ICD-10-CM | POA: Diagnosis not present

## 2022-02-17 DIAGNOSIS — Z7189 Other specified counseling: Secondary | ICD-10-CM

## 2022-02-17 DIAGNOSIS — Z5111 Encounter for antineoplastic chemotherapy: Secondary | ICD-10-CM | POA: Diagnosis not present

## 2022-02-17 DIAGNOSIS — Z95828 Presence of other vascular implants and grafts: Secondary | ICD-10-CM

## 2022-02-17 DIAGNOSIS — C9011 Plasma cell leukemia in remission: Secondary | ICD-10-CM | POA: Diagnosis not present

## 2022-02-17 LAB — CMP (CANCER CENTER ONLY)
ALT: 12 U/L (ref 0–44)
AST: 13 U/L — ABNORMAL LOW (ref 15–41)
Albumin: 4.1 g/dL (ref 3.5–5.0)
Alkaline Phosphatase: 72 U/L (ref 38–126)
Anion gap: 8 (ref 5–15)
BUN: 29 mg/dL — ABNORMAL HIGH (ref 8–23)
CO2: 25 mmol/L (ref 22–32)
Calcium: 9.1 mg/dL (ref 8.9–10.3)
Chloride: 109 mmol/L (ref 98–111)
Creatinine: 1.74 mg/dL — ABNORMAL HIGH (ref 0.61–1.24)
GFR, Estimated: 43 mL/min — ABNORMAL LOW (ref >60)
Glucose, Bld: 118 mg/dL — ABNORMAL HIGH (ref 70–99)
Potassium: 4.2 mmol/L (ref 3.5–5.1)
Sodium: 142 mmol/L (ref 135–145)
Total Bilirubin: 0.4 mg/dL (ref 0.3–1.2)
Total Protein: 7 g/dL (ref 6.5–8.1)

## 2022-02-17 LAB — CBC WITH DIFFERENTIAL/PLATELET
Abs Immature Granulocytes: 0.01 10*3/uL (ref 0.00–0.07)
Basophils Absolute: 0.2 10*3/uL — ABNORMAL HIGH (ref 0.0–0.1)
Basophils Relative: 2 %
Eosinophils Absolute: 0.5 10*3/uL (ref 0.0–0.5)
Eosinophils Relative: 7 %
HCT: 39 % (ref 39.0–52.0)
Hemoglobin: 13.1 g/dL (ref 13.0–17.0)
Immature Granulocytes: 0 %
Lymphocytes Relative: 22 %
Lymphs Abs: 1.6 10*3/uL (ref 0.7–4.0)
MCH: 31.6 pg (ref 26.0–34.0)
MCHC: 33.6 g/dL (ref 30.0–36.0)
MCV: 94 fL (ref 80.0–100.0)
Monocytes Absolute: 0.5 10*3/uL (ref 0.1–1.0)
Monocytes Relative: 7 %
Neutro Abs: 4.5 10*3/uL (ref 1.7–7.7)
Neutrophils Relative %: 62 %
Platelets: 197 10*3/uL (ref 150–400)
RBC: 4.15 MIL/uL — ABNORMAL LOW (ref 4.22–5.81)
RDW: 14.8 % (ref 11.5–15.5)
WBC: 7.3 10*3/uL (ref 4.0–10.5)
nRBC: 0 % (ref 0.0–0.2)

## 2022-02-17 MED ORDER — SODIUM CHLORIDE 0.9% FLUSH
10.0000 mL | INTRAVENOUS | Status: DC | PRN
  Administered 2022-02-17: 10 mL

## 2022-02-17 MED ORDER — SODIUM CHLORIDE 0.9 % IV SOLN: Freq: Once | INTRAVENOUS | Status: AC

## 2022-02-17 MED ORDER — SODIUM CHLORIDE 0.9 % IV SOLN
20.0000 mg | Freq: Once | INTRAVENOUS | Status: AC
  Administered 2022-02-17: 20 mg via INTRAVENOUS
  Filled 2022-02-17: qty 20

## 2022-02-17 MED ORDER — ACETAMINOPHEN 325 MG PO TABS
650.0000 mg | ORAL_TABLET | Freq: Once | ORAL | Status: AC
  Administered 2022-02-17: 650 mg via ORAL
  Filled 2022-02-17: qty 2

## 2022-02-17 MED ORDER — LENALIDOMIDE 10 MG PO CAPS: 10.0000 mg | ORAL_CAPSULE | Freq: Every day | ORAL | 0 refills | Status: DC

## 2022-02-17 MED ORDER — SODIUM CHLORIDE 0.9 % IV SOLN: 60.0000 mg | Freq: Once | INTRAVENOUS | Status: DC

## 2022-02-17 MED ORDER — SODIUM CHLORIDE 0.9% FLUSH
10.0000 mL | INTRAVENOUS | Status: DC | PRN
  Administered 2022-02-17: 10 mL via INTRAVENOUS

## 2022-02-17 MED ORDER — FAMOTIDINE 20 MG PO TABS
20.0000 mg | ORAL_TABLET | Freq: Once | ORAL | Status: AC
  Administered 2022-02-17: 20 mg via ORAL
  Filled 2022-02-17: qty 1

## 2022-02-17 MED ORDER — DEXTROSE 5 % IV SOLN
56.0000 mg/m2 | Freq: Once | INTRAVENOUS | Status: AC
  Administered 2022-02-17: 100 mg via INTRAVENOUS
  Filled 2022-02-17: qty 30

## 2022-02-17 MED ORDER — HEPARIN SOD (PORK) LOCK FLUSH 100 UNIT/ML IV SOLN
500.0000 [IU] | Freq: Once | INTRAVENOUS | Status: AC | PRN
  Administered 2022-02-17: 500 [IU]

## 2022-02-17 NOTE — Progress Notes (Signed)
Dr Irene Limbo is aware of today's CR of 1.74. Pt is ok for tx today.  ?

## 2022-02-17 NOTE — Patient Instructions (Signed)
[facility] MEDICAL [facility]COLOGY  Discharge Instructions: Thank you for choosing [facility] to provide your oncology and hematology care.   If you have a lab appointment with the Cancer Center, please go directly to the Cancer Center and check in at the registration area.   Wear comfortable clothing and clothing appropriate for easy access to any Portacath or PICC line.   We strive to give you quality time with your provider. You may need to reschedule your appointment if you arrive late (15 or more minutes).  Arriving late affects you and other patients whose appointments are after yours.  Also, if you miss three or more appointments without notifying the office, you may be dismissed from the clinic at the provider's discretion.      For prescription refill requests, have your pharmacy contact our office and allow 72 hours for refills to be completed.    Today you received the following chemotherapy and/or immunotherapy agents: Kyprolis    To help prevent nausea and vomiting after your treatment, we encourage you to take your nausea medication as directed.  BELOW ARE SYMPTOMS THAT SHOULD BE REPORTED IMMEDIATELY: . *FEVER GREATER THAN 100.4 F (38 C) OR HIGHER . *CHILLS OR SWEATING . *NAUSEA AND VOMITING THAT IS NOT CONTROLLED WITH YOUR NAUSEA MEDICATION . *UNUSUAL SHORTNESS OF BREATH . *UNUSUAL BRUISING OR BLEEDING . *URINARY PROBLEMS (pain or burning when urinating, or frequent urination) . *BOWEL PROBLEMS (unusual diarrhea, constipation, pain near the anus) . TENDERNESS IN MOUTH AND THROAT WITH OR WITHOUT PRESENCE OF ULCERS (sore throat, sores in mouth, or a toothache) . UNUSUAL RASH, SWELLING OR PAIN  . UNUSUAL VAGINAL DISCHARGE OR ITCHING   Items with * indicate a potential emergency and should be followed up as soon as possible or go to the Emergency Department if any problems should occur.  Please show the CHEMOTHERAPY ALERT CARD or IMMUNOTHERAPY ALERT  CARD at check-in to the Emergency Department and triage nurse.  Should you have questions after your visit or need to cancel or reschedule your appointment, please contact [facility] MEDICAL ONCOLOG[facility]  Dept: [phone]  and follow the prompts.  Office hours are 8:00 a.m. to 4:30 p.m. Monday - Friday. Please note that voicemails left after 4:00 p.m. may not be returned until the following business day.  We are closed weekends and major holidays. You have access to a nurse at all times for urgent questions. Please call the main number to the clinic Dept: [phone] and follow the prompts.   For any non-urgent questions, you may also contact your provider using MyChart. We now offer e-Visits for anyone 18 and older to request care online for non-urgent symptoms. For details visit [email or web address].   Also download the MyChart app! Go to the app store, search "MyChart", open the app, select [facility], and log in with your MyChart username and password.  Due to Covid, a mask is required upon entering the hospital/clinic. If you do not have a mask, one will be given to you upon arrival. For doctor visits, patients may have 1 support person aged 18 or older with them. For treatment visits, patients cannot have anyone with them due to current Covid guidelines and our immunocompromised population.   

## 2022-02-17 NOTE — Progress Notes (Signed)
Per Irene Limbo MD, pt does not need extra fluids included in tx plan today. ?

## 2022-02-17 NOTE — Progress Notes (Signed)
Per Zenon Mayo, RN - Dr. Irene Limbo ok'd resuming Kyprolis today.   ?Pt's Kyprolis recently held for URI/PNA. ? ?Kennith Center, Pharm.D., CPP ?02/17/2022'@11'$ :20 AM ? ? ?

## 2022-03-03 ENCOUNTER — Other Ambulatory Visit: Payer: Self-pay

## 2022-03-03 ENCOUNTER — Inpatient Hospital Stay: Payer: BC Managed Care – PPO

## 2022-03-03 VITALS — BP 136/80 | HR 71 | Temp 97.9°F | Resp 18 | Wt 158.5 lb

## 2022-03-03 DIAGNOSIS — Z5111 Encounter for antineoplastic chemotherapy: Secondary | ICD-10-CM

## 2022-03-03 DIAGNOSIS — Z7189 Other specified counseling: Secondary | ICD-10-CM

## 2022-03-03 DIAGNOSIS — C9010 Plasma cell leukemia not having achieved remission: Secondary | ICD-10-CM

## 2022-03-03 DIAGNOSIS — Z95828 Presence of other vascular implants and grafts: Secondary | ICD-10-CM

## 2022-03-03 LAB — CMP (CANCER CENTER ONLY)
ALT: 12 U/L (ref 0–44)
AST: 12 U/L — ABNORMAL LOW (ref 15–41)
Albumin: 4.1 g/dL (ref 3.5–5.0)
Alkaline Phosphatase: 67 U/L (ref 38–126)
Anion gap: 8 (ref 5–15)
BUN: 27 mg/dL — ABNORMAL HIGH (ref 8–23)
CO2: 26 mmol/L (ref 22–32)
Calcium: 8.9 mg/dL (ref 8.9–10.3)
Chloride: 108 mmol/L (ref 98–111)
Creatinine: 1.76 mg/dL — ABNORMAL HIGH (ref 0.61–1.24)
GFR, Estimated: 43 mL/min — ABNORMAL LOW (ref >60)
Glucose, Bld: 107 mg/dL — ABNORMAL HIGH (ref 70–99)
Potassium: 4.3 mmol/L (ref 3.5–5.1)
Sodium: 142 mmol/L (ref 135–145)
Total Bilirubin: 0.4 mg/dL (ref 0.3–1.2)
Total Protein: 6.9 g/dL (ref 6.5–8.1)

## 2022-03-03 LAB — CBC WITH DIFFERENTIAL/PLATELET
Abs Immature Granulocytes: 0.03 10*3/uL (ref 0.00–0.07)
Basophils Absolute: 0.1 10*3/uL (ref 0.0–0.1)
Basophils Relative: 1 %
Eosinophils Absolute: 0.7 10*3/uL — ABNORMAL HIGH (ref 0.0–0.5)
Eosinophils Relative: 8 %
HCT: 37.3 % — ABNORMAL LOW (ref 39.0–52.0)
Hemoglobin: 12.7 g/dL — ABNORMAL LOW (ref 13.0–17.0)
Immature Granulocytes: 0 %
Lymphocytes Relative: 25 %
Lymphs Abs: 2.1 10*3/uL (ref 0.7–4.0)
MCH: 31.8 pg (ref 26.0–34.0)
MCHC: 34 g/dL (ref 30.0–36.0)
MCV: 93.3 fL (ref 80.0–100.0)
Monocytes Absolute: 0.9 10*3/uL (ref 0.1–1.0)
Monocytes Relative: 11 %
Neutro Abs: 4.5 10*3/uL (ref 1.7–7.7)
Neutrophils Relative %: 55 %
Platelets: 200 10*3/uL (ref 150–400)
RBC: 4 MIL/uL — ABNORMAL LOW (ref 4.22–5.81)
RDW: 14.9 % (ref 11.5–15.5)
WBC: 8.2 10*3/uL (ref 4.0–10.5)
nRBC: 0 % (ref 0.0–0.2)

## 2022-03-03 MED ORDER — FAMOTIDINE 20 MG PO TABS
20.0000 mg | ORAL_TABLET | Freq: Once | ORAL | Status: AC
  Administered 2022-03-03: 20 mg via ORAL
  Filled 2022-03-03: qty 1

## 2022-03-03 MED ORDER — SODIUM CHLORIDE 0.9 % IV SOLN: Freq: Once | INTRAVENOUS | Status: AC

## 2022-03-03 MED ORDER — SODIUM CHLORIDE 0.9 % IV SOLN: INTRAVENOUS | Status: AC

## 2022-03-03 MED ORDER — SODIUM CHLORIDE 0.9 % IV SOLN
20.0000 mg | Freq: Once | INTRAVENOUS | Status: AC
  Administered 2022-03-03: 20 mg via INTRAVENOUS
  Filled 2022-03-03: qty 20

## 2022-03-03 MED ORDER — ACETAMINOPHEN 325 MG PO TABS
650.0000 mg | ORAL_TABLET | Freq: Once | ORAL | Status: AC
  Administered 2022-03-03: 650 mg via ORAL
  Filled 2022-03-03: qty 2

## 2022-03-03 MED ORDER — DEXTROSE 5 % IV SOLN
56.0000 mg/m2 | Freq: Once | INTRAVENOUS | Status: AC
  Administered 2022-03-03: 100 mg via INTRAVENOUS
  Filled 2022-03-03: qty 30

## 2022-03-03 MED ORDER — SODIUM CHLORIDE 0.9% FLUSH
10.0000 mL | INTRAVENOUS | Status: AC | PRN
  Administered 2022-03-03: 10 mL

## 2022-03-03 NOTE — Patient Instructions (Signed)
[facility]ICAL [facility]COLOG[facility]  Discharge Instructions: Thank you for choosing [facility] to provide your oncology and hematology care.   If you have a lab appointment with the Cancer Center, please go directly to the Cancer Center and check in at the registration area.   Wear comfortable clothing and clothing appropriate for easy access to any Portacath or PICC line.   We strive to give you quality time with your provider. You may need to reschedule your appointment if you arrive late (15 or more minutes).  Arriving late affects you and other patients whose appointments are after yours.  Also, if you miss three or more appointments without notifying the office, you may be dismissed from the clinic at the provider's discretion.      For prescription refill requests, have your pharmacy contact our office and allow 72 hours for refills to be completed.    Today you received the following chemotherapy and/or immunotherapy agents: Kyprolis    To help prevent nausea and vomiting after your treatment, we encourage you to take your nausea medication as directed.  BELOW ARE SYMPTOMS THAT SHOULD BE REPORTED IMMEDIATELY: . *FEVER GREATER THAN 100.4 F (38 C) OR HIGHER . *CHILLS OR SWEATING . *NAUSEA AND VOMITING THAT IS NOT CONTROLLED WITH YOUR NAUSEA MEDICATION . *UNUSUAL SHORTNESS OF BREATH . *UNUSUAL BRUISING OR BLEEDING . *URINARY PROBLEMS (pain or burning when urinating, or frequent urination) . *BOWEL PROBLEMS (unusual diarrhea, constipation, pain near the anus) . TENDERNESS IN MOUTH AND THROAT WITH OR WITHOUT PRESENCE OF ULCERS (sore throat, sores in mouth, or a toothache) . UNUSUAL RASH, SWELLING OR PAIN  . UNUSUAL VAGINAL DISCHARGE OR ITCHING   Items with * indicate a potential emergency and should be followed up as soon as possible or go to the Emergency Department if any problems should occur.  Please show the CHEMOTHERAPY ALERT CARD or IMMUNOTHERAPY ALERT  CARD at check-in to the Emergency Department and triage nurse.  Should you have questions after your visit or need to cancel or reschedule your appointment, please contact [facility]  Dept: [phone]  and follow the prompts.  Office hours are 8:00 a.m. to 4:30 p.m. Monday - Friday. Please note that voicemails left after 4:00 p.m. may not be returned until the following business day.  We are closed weekends and major holidays. You have access to a nurse at all times for urgent questions. Please call the main number to the clinic Dept: [phone] and follow the prompts.   For any non-urgent questions, you may also contact your provider using MyChart. We now offer e-Visits for anyone 18 and older to request care online for non-urgent symptoms. For details visit [email or web address].   Also download the MyChart app! Go to the app store, search "MyChart", open the app, select [facility], and log in with your MyChart username and password.  Due to Covid, a mask is required upon entering the hospital/clinic. If you do not have a mask, one will be given to you upon arrival. For doctor visits, patients may have 1 support person aged 18 or older with them. For treatment visits, patients cannot have anyone with them due to current Covid guidelines and our immunocompromised population.   

## 2022-03-03 NOTE — Progress Notes (Signed)
Ok to treat today with creatinine level of 1.76 mg/dL per Dr. Irene Limbo ?

## 2022-03-06 LAB — KAPPA/LAMBDA LIGHT CHAINS
Kappa free light chain: 24.7 mg/L — ABNORMAL HIGH (ref 3.3–19.4)
Kappa, lambda light chain ratio: 1.28 (ref 0.26–1.65)
Lambda free light chains: 19.3 mg/L (ref 5.7–26.3)

## 2022-03-08 LAB — MULTIPLE MYELOMA PANEL, SERUM
Albumin SerPl Elph-Mcnc: 3.7 g/dL (ref 2.9–4.4)
Albumin/Glob SerPl: 1.2 (ref 0.7–1.7)
Alpha 1: 0.2 g/dL (ref 0.0–0.4)
Alpha2 Glob SerPl Elph-Mcnc: 1.1 g/dL — ABNORMAL HIGH (ref 0.4–1.0)
B-Globulin SerPl Elph-Mcnc: 0.9 g/dL (ref 0.7–1.3)
Gamma Glob SerPl Elph-Mcnc: 0.8 g/dL (ref 0.4–1.8)
Globulin, Total: 3.1 g/dL (ref 2.2–3.9)
IgA: 97 mg/dL (ref 61–437)
IgG (Immunoglobin G), Serum: 840 mg/dL (ref 603–1613)
IgM (Immunoglobulin M), Srm: 6 mg/dL — ABNORMAL LOW (ref 20–172)
Total Protein ELP: 6.8 g/dL (ref 6.0–8.5)

## 2022-03-16 ENCOUNTER — Other Ambulatory Visit: Payer: Self-pay | Admitting: *Deleted

## 2022-03-16 DIAGNOSIS — C9010 Plasma cell leukemia not having achieved remission: Secondary | ICD-10-CM

## 2022-03-16 MED ORDER — LENALIDOMIDE 10 MG PO CAPS: 10.0000 mg | ORAL_CAPSULE | Freq: Every day | ORAL | 0 refills | Status: DC

## 2022-03-17 ENCOUNTER — Other Ambulatory Visit: Payer: Self-pay | Admitting: Hematology

## 2022-03-17 ENCOUNTER — Emergency Department (HOSPITAL_COMMUNITY)
Admission: EM | Admit: 2022-03-17 | Discharge: 2022-03-17 | Disposition: A | Discharge status: Home / Self Care | Payer: BC Managed Care – PPO | Attending: Emergency Medicine | Admitting: Emergency Medicine

## 2022-03-17 ENCOUNTER — Other Ambulatory Visit: Payer: Self-pay

## 2022-03-17 ENCOUNTER — Inpatient Hospital Stay: Payer: BC Managed Care – PPO

## 2022-03-17 ENCOUNTER — Encounter (HOSPITAL_COMMUNITY): Payer: Self-pay

## 2022-03-17 ENCOUNTER — Emergency Department (HOSPITAL_COMMUNITY): Payer: BC Managed Care – PPO

## 2022-03-17 ENCOUNTER — Inpatient Hospital Stay: Payer: BC Managed Care – PPO | Attending: Hematology

## 2022-03-17 VITALS — BP 123/76 | HR 94 | Temp 98.7°F | Resp 18 | Wt 152.1 lb

## 2022-03-17 VITALS — BP 119/71 | HR 93 | Temp 99.6°F

## 2022-03-17 DIAGNOSIS — M84551A Pathological fracture in neoplastic disease, right femur, initial encounter for fracture: Secondary | ICD-10-CM

## 2022-03-17 DIAGNOSIS — R0602 Shortness of breath: Secondary | ICD-10-CM | POA: Diagnosis present

## 2022-03-17 DIAGNOSIS — Z95828 Presence of other vascular implants and grafts: Secondary | ICD-10-CM

## 2022-03-17 DIAGNOSIS — C9010 Plasma cell leukemia not having achieved remission: Secondary | ICD-10-CM

## 2022-03-17 DIAGNOSIS — M545 Low back pain, unspecified: Secondary | ICD-10-CM | POA: Diagnosis not present

## 2022-03-17 DIAGNOSIS — R109 Unspecified abdominal pain: Secondary | ICD-10-CM | POA: Diagnosis not present

## 2022-03-17 DIAGNOSIS — Z7189 Other specified counseling: Secondary | ICD-10-CM

## 2022-03-17 DIAGNOSIS — R059 Cough, unspecified: Secondary | ICD-10-CM | POA: Insufficient documentation

## 2022-03-17 DIAGNOSIS — R079 Chest pain, unspecified: Secondary | ICD-10-CM | POA: Insufficient documentation

## 2022-03-17 DIAGNOSIS — C9011 Plasma cell leukemia in remission: Secondary | ICD-10-CM | POA: Insufficient documentation

## 2022-03-17 DIAGNOSIS — Z5111 Encounter for antineoplastic chemotherapy: Secondary | ICD-10-CM | POA: Insufficient documentation

## 2022-03-17 DIAGNOSIS — C9001 Multiple myeloma in remission: Secondary | ICD-10-CM | POA: Insufficient documentation

## 2022-03-17 DIAGNOSIS — J449 Chronic obstructive pulmonary disease, unspecified: Secondary | ICD-10-CM | POA: Insufficient documentation

## 2022-03-17 DIAGNOSIS — I10 Essential (primary) hypertension: Secondary | ICD-10-CM | POA: Diagnosis not present

## 2022-03-17 DIAGNOSIS — Z20822 Contact with and (suspected) exposure to covid-19: Secondary | ICD-10-CM | POA: Insufficient documentation

## 2022-03-17 LAB — CBC WITH DIFFERENTIAL/PLATELET
Abs Immature Granulocytes: 0.02 10*3/uL (ref 0.00–0.07)
Abs Immature Granulocytes: 0.1 10*3/uL — ABNORMAL HIGH (ref 0.00–0.07)
Basophils Absolute: 0.1 10*3/uL (ref 0.0–0.1)
Basophils Absolute: 0.1 10*3/uL (ref 0.0–0.1)
Basophils Relative: 1 %
Basophils Relative: 1 %
Eosinophils Absolute: 0 10*3/uL (ref 0.0–0.5)
Eosinophils Absolute: 0.1 10*3/uL (ref 0.0–0.5)
Eosinophils Relative: 1 %
Eosinophils Relative: 2 %
HCT: 35.8 % — ABNORMAL LOW (ref 39.0–52.0)
HCT: 32.7 % — ABNORMAL LOW (ref 39.0–52.0)
Hemoglobin: 12.2 g/dL — ABNORMAL LOW (ref 13.0–17.0)
Hemoglobin: 11.1 g/dL — ABNORMAL LOW (ref 13.0–17.0)
Immature Granulocytes: 0 %
Lymphocytes Relative: 18 %
Lymphocytes Relative: 38 %
Lymphs Abs: 1.2 10*3/uL (ref 0.7–4.0)
Lymphs Abs: 2.6 10*3/uL (ref 0.7–4.0)
MCH: 31.4 pg (ref 26.0–34.0)
MCH: 32.2 pg (ref 26.0–34.0)
MCHC: 34.1 g/dL (ref 30.0–36.0)
MCHC: 33.9 g/dL (ref 30.0–36.0)
MCV: 92.3 fL (ref 80.0–100.0)
MCV: 94.8 fL (ref 80.0–100.0)
Metamyelocytes Relative: 1 %
Monocytes Absolute: 1.6 10*3/uL — ABNORMAL HIGH (ref 0.1–1.0)
Monocytes Absolute: 0.5 10*3/uL (ref 0.1–1.0)
Monocytes Relative: 24 %
Monocytes Relative: 7 %
Neutro Abs: 3.7 10*3/uL (ref 1.7–7.7)
Neutro Abs: 3.5 10*3/uL (ref 1.7–7.7)
Neutrophils Relative %: 56 %
Neutrophils Relative %: 51 %
Platelets: 235 10*3/uL (ref 150–400)
Platelets: 216 10*3/uL (ref 150–400)
RBC: 3.88 MIL/uL — ABNORMAL LOW (ref 4.22–5.81)
RBC: 3.45 MIL/uL — ABNORMAL LOW (ref 4.22–5.81)
RDW: 15.1 % (ref 11.5–15.5)
RDW: 15.3 % (ref 11.5–15.5)
Smear Review: NORMAL
WBC: 6.6 10*3/uL (ref 4.0–10.5)
WBC: 6.9 10*3/uL (ref 4.0–10.5)
nRBC: 0 % (ref 0.0–0.2)
nRBC: 0 % (ref 0.0–0.2)

## 2022-03-17 LAB — COMPREHENSIVE METABOLIC PANEL
ALT: 12 U/L (ref 0–44)
AST: 11 U/L — ABNORMAL LOW (ref 15–41)
Albumin: 3 g/dL — ABNORMAL LOW (ref 3.5–5.0)
Alkaline Phosphatase: 63 U/L (ref 38–126)
Anion gap: 8 (ref 5–15)
BUN: 28 mg/dL — ABNORMAL HIGH (ref 8–23)
CO2: 20 mmol/L — ABNORMAL LOW (ref 22–32)
Calcium: 7.9 mg/dL — ABNORMAL LOW (ref 8.9–10.3)
Chloride: 112 mmol/L — ABNORMAL HIGH (ref 98–111)
Creatinine, Ser: 1.64 mg/dL — ABNORMAL HIGH (ref 0.61–1.24)
GFR, Estimated: 46 mL/min — ABNORMAL LOW (ref >60)
Glucose, Bld: 106 mg/dL — ABNORMAL HIGH (ref 70–99)
Potassium: 4 mmol/L (ref 3.5–5.1)
Sodium: 140 mmol/L (ref 135–145)
Total Bilirubin: 0.4 mg/dL (ref 0.3–1.2)
Total Protein: 6.9 g/dL (ref 6.5–8.1)

## 2022-03-17 LAB — CMP (CANCER CENTER ONLY)
ALT: 10 U/L (ref 0–44)
AST: 9 U/L — ABNORMAL LOW (ref 15–41)
Albumin: 3.8 g/dL (ref 3.5–5.0)
Alkaline Phosphatase: 83 U/L (ref 38–126)
Anion gap: 10 (ref 5–15)
BUN: 30 mg/dL — ABNORMAL HIGH (ref 8–23)
CO2: 22 mmol/L (ref 22–32)
Calcium: 8.8 mg/dL — ABNORMAL LOW (ref 8.9–10.3)
Chloride: 106 mmol/L (ref 98–111)
Creatinine: 1.94 mg/dL — ABNORMAL HIGH (ref 0.61–1.24)
GFR, Estimated: 38 mL/min — ABNORMAL LOW (ref >60)
Glucose, Bld: 143 mg/dL — ABNORMAL HIGH (ref 70–99)
Potassium: 4.1 mmol/L (ref 3.5–5.1)
Sodium: 138 mmol/L (ref 135–145)
Total Bilirubin: 0.6 mg/dL (ref 0.3–1.2)
Total Protein: 7.7 g/dL (ref 6.5–8.1)

## 2022-03-17 LAB — RESP PANEL BY RT-PCR (FLU A&B, COVID) ARPGX2
Influenza A by PCR: NEGATIVE
Influenza B by PCR: NEGATIVE
SARS Coronavirus 2 by RT PCR: NEGATIVE

## 2022-03-17 LAB — TROPONIN I (HIGH SENSITIVITY)
Troponin I (High Sensitivity): 9 ng/L (ref <18)
Troponin I (High Sensitivity): 9 ng/L (ref <18)

## 2022-03-17 LAB — D-DIMER, QUANTITATIVE: D-Dimer, Quant: 0.58 ug/mL-FEU — ABNORMAL HIGH (ref 0.00–0.50)

## 2022-03-17 MED ORDER — ALBUTEROL SULFATE HFA 108 (90 BASE) MCG/ACT IN AERS
2.0000 | INHALATION_SPRAY | Freq: Once | RESPIRATORY_TRACT | Status: AC
  Administered 2022-03-17: 2 via RESPIRATORY_TRACT
  Filled 2022-03-17: qty 6.7

## 2022-03-17 MED ORDER — DEXAMETHASONE SODIUM PHOSPHATE 10 MG/ML IJ SOLN
10.0000 mg | Freq: Once | INTRAMUSCULAR | Status: AC
Start: 2022-03-17 — End: 2022-03-17
  Administered 2022-03-17: 10 mg via INTRAMUSCULAR
  Filled 2022-03-17: qty 1

## 2022-03-17 MED ORDER — SODIUM CHLORIDE 0.9% FLUSH: 10.0000 mL | Freq: Once | INTRAVENOUS | Status: DC | PRN

## 2022-03-17 MED ORDER — SODIUM CHLORIDE 0.9 % IV SOLN: INTRAVENOUS | Status: DC

## 2022-03-17 MED ORDER — SODIUM CHLORIDE 0.9% FLUSH
10.0000 mL | INTRAVENOUS | Status: AC | PRN
  Administered 2022-03-17: 10 mL

## 2022-03-17 MED ORDER — SODIUM CHLORIDE 0.9 % IV SOLN
60.0000 mg | Freq: Once | INTRAVENOUS | Status: AC
  Administered 2022-03-17: 60 mg via INTRAVENOUS
  Filled 2022-03-17: qty 10

## 2022-03-17 MED ORDER — HEPARIN SOD (PORK) LOCK FLUSH 100 UNIT/ML IV SOLN: 500.0000 [IU] | Freq: Once | INTRAVENOUS | Status: DC | PRN

## 2022-03-17 MED ORDER — HEPARIN SOD (PORK) LOCK FLUSH 100 UNIT/ML IV SOLN
500.0000 [IU] | Freq: Once | INTRAVENOUS | Status: AC
  Administered 2022-03-17: 500 [IU]
  Filled 2022-03-17: qty 5

## 2022-03-17 NOTE — ED Provider Notes (Signed)
?[facility] DEPT ?Provider Note ? ? ?CSN: [id number] ?Arrival date & time: 03/17/22  1506 ? ?  ? ?History ? ?Chief Complaint  ?Patient presents with  ? Shortness of Breath  ? Cough  ? Side Pain  ? ? ?Andres Fernandez is a 66 y.o. male with medical history significant for multiple myeloma currently being treated with chemotherapy, hepatitis C, COPD, hypertension, BPH.  Patient presents ED after being sent from his cancer treatment center due to shortness of breath and chest pain associated with coughing.  Patient states the chest pain is worsened with deep inspiration.  Patient states the chest pain starts in the right side of his chest and then radiates to the left side of his chest.  Patient states that a month and a half ago he was diagnosed with pneumonia and the flu.  The patient states that he took antibiotics to completion as well as Flovent and albuterol.  Patient states that his symptoms alleviated however they returned this past Tuesday with nausea and then acutely worsened on Wednesday to include low back pain, right flank pain, shortness of breath and cough with mucus.  Patient endorsing abdominal pain described as lower, decreased appetite, chest pain, shortness of breath, cough.  Patient denies any fevers, vomiting, diarrhea, dysuria, constipation, history of blood clots. ? ? ? ?Shortness of Breath ?Associated symptoms: abdominal pain, chest pain and cough   ?Associated symptoms: no fever and no vomiting   ?Cough ?Associated symptoms: chest pain and shortness of breath   ?Associated symptoms: no fever   ? ?  ? ?Home Medications ?Prior to Admission medications   ?Medication Sig Start Date End Date Taking? Authorizing Provider  ?acyclovir (ZOVIRAX) 400 MG tablet Take 1 tablet (400 mg total) by mouth daily. 08/05/21   Brunetta Genera, MD  ?Albuterol Sulfate (PROAIR [place]) 315 (90 Base) MCG/ACT AEPB Inhale into the lungs.    [provider]  ?amoxicillin  (AMOXIL) 500 MG capsule Take 500 mg by mouth 3 (three) times daily. 01/27/22   [provider]  ?aspirin EC 81 MG tablet Take 1 tablet (81 mg total) by mouth daily. ?Patient taking differently: Take 81 mg by mouth at bedtime. 11/28/19   Brunetta Genera, MD  ?cholecalciferol (VITAMIN D) 400 units TABS tablet Take 400 Units by mouth at bedtime.     [provider]  ?[place] 250 MCG/ACT AEPB Inhale into the lungs. 02/02/22   [provider]  ?ipratropium-albuterol (DUONEB) 0.5-2.5 (3) MG/3ML SOLN Inhale into the lungs. 01/31/22   [provider]  ?lenalidomide (REVLIMID) 10 MG capsule Take 1 capsule (10 mg total) by mouth daily. Take for 21 days on, 7 days off, repeat every 28 days. 03/16/22   Brunetta Genera, MD  ?lidocaine-prilocaine (EMLA) cream Apply 1 application topically as needed. Apply to Port-A-Cath at least 1 hr prior to treatment. 09/30/21   Brunetta Genera, MD  ?Multiple Vitamins-Minerals (MULTIVITAMIN WITH MINERALS) tablet Take 1 tablet by mouth at bedtime.     [provider]  ?nicotine (NICODERM CQ - DOSED IN MG/24 HOURS) 14 mg/24hr patch PLACE 1 PATCH (14 MG TOTAL) ONTO THE SKIN DAILY. 09/30/21   Brunetta Genera, MD  ?ondansetron (ZOFRAN) 4 MG tablet Take 2 tablets (8 mg total) by mouth every 8 (eight) hours as needed for nausea. 08/05/21   Brunetta Genera, MD  ?Vitamin D, Ergocalciferol, (DRISDOL) 1.25 MG (50000 UNIT) CAPS capsule Take 1 capsule (50,000 Units total) by mouth  once a week. 08/05/21   Brunetta Genera, MD  ?   ? ?Allergies    ?Patient has no known allergies.   ? ?Review of Systems   ?Review of Systems  ?Constitutional:  Positive for appetite change. Negative for fever.  ?Respiratory:  Positive for cough and shortness of breath.   ?Cardiovascular:  Positive for chest pain.  ?Gastrointestinal:  Positive for abdominal pain and nausea. Negative for constipation, diarrhea and vomiting.  ?Genitourinary:  Positive for  flank pain. Negative for dysuria.  ?All other systems reviewed and are negative. ? ?Physical Exam ?Updated Vital Signs ?BP 122/76   Pulse 88   Temp 98.1 ?F (36.7 ?C) (Oral)   Resp 20   SpO2 97%  ?Physical Exam ?Vitals and nursing note reviewed.  ?Constitutional:   ?   General: He is not in acute distress. ?   Appearance: He is well-developed. He is not ill-appearing, toxic-appearing or diaphoretic.  ?HENT:  ?   Head: Normocephalic and atraumatic.  ?   Mouth/Throat:  ?   Mouth: Mucous membranes are moist.  ?   Pharynx: Oropharynx is clear. No pharyngeal swelling or oropharyngeal exudate.  ?Eyes:  ?   Extraocular Movements: Extraocular movements intact.  ?   Pupils: Pupils are equal, round, and reactive to light.  ?Neck:  ?   Vascular: No JVD.  ?Cardiovascular:  ?   Rate and Rhythm: Normal rate and regular rhythm.  ?Pulmonary:  ?   Effort: Pulmonary effort is normal. No tachypnea or respiratory distress.  ?   Breath sounds: Normal breath sounds. No decreased breath sounds, wheezing, rhonchi or rales.  ?Chest:  ?   Chest wall: No tenderness.  ?Abdominal:  ?   General: Bowel sounds are normal.  ?   Palpations: Abdomen is soft. There is no mass.  ?Musculoskeletal:     ?   General: Normal range of motion.  ?   Cervical back: Normal range of motion and neck supple.  ?   Right lower leg: No edema.  ?   Left lower leg: No edema.  ?Skin: ?   General: Skin is warm and dry.  ?   Capillary Refill: Capillary refill takes less than 2 seconds.  ?Neurological:  ?   Mental Status: He is alert and oriented to person, place, and time.  ? ? ?ED Results / Procedures / Treatments   ?Labs ?(all labs ordered are listed, but only abnormal results are displayed) ?Labs Reviewed  ?CBC WITH DIFFERENTIAL/PLATELET - Abnormal; Notable for the following components:  ?    Result Value  ? RBC 3.45 (*)   ? Hemoglobin 11.1 (*)   ? HCT 32.7 (*)   ? Abs Immature Granulocytes 0.10 (*)   ? All other components within normal limits  ?COMPREHENSIVE  METABOLIC PANEL - Abnormal; Notable for the following components:  ? Chloride 112 (*)   ? CO2 20 (*)   ? Glucose, Bld 106 (*)   ? BUN 28 (*)   ? Creatinine, Ser 1.64 (*)   ? Calcium 7.9 (*)   ? Albumin 3.0 (*)   ? AST 11 (*)   ? GFR, Estimated 46 (*)   ? All other components within normal limits  ?D-DIMER, QUANTITATIVE - Abnormal; Notable for the following components:  ? D-Dimer, Quant 0.58 (*)   ? All other components within normal limits  ?RESP PANEL BY RT-PCR (FLU A&B, COVID) [id number]  ?TROPONIN I (HIGH SENSITIVITY)  ?TROPONIN I (HIGH SENSITIVITY)  ? ? ?  EKG ?EKG Interpretation ? ?Date/Time:  Friday March 17 2022 15:30:43 EDT ?Ventricular Rate:  89 ?PR Interval:  143 ?QRS Duration: 91 ?QT Interval:  399 ?QTC Calculation: 486 ?R Axis:   57 ?Text Interpretation: Sinus rhythm Abnormal R-wave progression, early transition Borderline prolonged QT interval Confirmed by Lennice Sites [phone]) on 03/17/2022 3:56:14 PM ? ?Radiology ?DG Chest Portable 1 View ? ?Result Date: 03/17/2022 ?CLINICAL DATA:  Chest pain with shortness of breath and cough. History of multiple myeloma and smoking. EXAM: PORTABLE CHEST 1 VIEW COMPARISON:  Radiographs 01/27/2022 and 10/28/2021.  CT 11/25/2017. FINDINGS: 1606 hours. Lordotic positioning. Right IJ Port-A-Cath extends to the lower SVC level. The heart size and mediastinal contours are stable. Advanced emphysematous changes are present in both lungs. There is no edema, confluent airspace opacity, pleural effusion or pneumothorax. The bones appear unremarkable. Telemetry leads overlie the chest. IMPRESSION: Stable radiographic appearance of the chest. Chronic obstructive pulmonary disease. Electronically Signed   By: Richardean Sale M.D.   On: 03/17/2022 16:14   ? ?Procedures ?Procedures  ? ?Medications Ordered in ED ?Medications  ?dexamethasone (DECADRON) injection 10 mg (10 mg Intramuscular Given 03/17/22 1749)  ?albuterol (VENTOLIN HFA) 108 (90 Base) MCG/ACT inhaler 2 puff (2 puffs Inhalation  Given 03/17/22 1749)  ? ? ?ED Course/ Medical Decision Making/ A&P ?  ?                        ?Medical Decision Making ?Amount and/or Complexity of Data Reviewed ?Labs: ordered. ?Radiology: ordered. ? ?Risk ?Prescription drug

## 2022-03-17 NOTE — ED Triage Notes (Signed)
From [facility] for [facility], sent to ED due to SOB and chest pain associated with coughing.  ?

## 2022-03-17 NOTE — Progress Notes (Signed)
Pt complaining of cough, SOB, nausea/vomiting. MD notified. Per Irene Limbo MD, hold Chemo and Revlimid for two weeks while pt recovers from respiratory infection. ?

## 2022-03-17 NOTE — Patient Instructions (Signed)
Pamidronate Injection ?What is this medication? ?PAMIDRONATE (pa mi DROE nate) slows calcium loss from bones. It treats Paget's disease and high calcium levels in the blood from some kinds of cancer. It may be used in other people at risk for bone loss. ?This medicine may be used for other purposes; ask your health care provider or pharmacist if you have questions. ?COMMON BRAND NAME(S): Aredia ?What should I tell my care team before I take this medication? ?They need to know if you have any of these conditions: ?bleeding disorder ?cancer ?dental disease ?kidney disease ?low levels of calcium or other minerals in the blood ?low red blood cell counts ?receiving steroids like dexamethasone or prednisone ?an unusual or allergic reaction to pamidronate, other drugs, foods, dyes or preservatives ?pregnant or trying to get pregnant ?breast-feeding ?How should I use this medication? ?This drug is injected into a vein. It is given by a health care provider in a hospital or clinic setting. ?Talk to your health care provider about the use of this drug in children. Special care may be needed. ?Overdosage: If you think you have taken too much of this medicine contact a poison control center or emergency room at once. ?NOTE: This medicine is only for you. Do not share this medicine with others. ?What if I miss a dose? ?Keep appointments for follow-up doses. It is important not to miss your dose. Call your health care provider if you are unable to keep an appointment. ?What may interact with this medication? ?certain antibiotics given by injection ?medicines for inflammation or pain like ibuprofen, naproxen ?some diuretics like bumetanide, furosemide ?cyclosporine ?parathyroid hormone ?tacrolimus ?teriparatide ?thalidomide ?This list may not describe all possible interactions. Give your health care provider a list of all the medicines, herbs, non-prescription drugs, or dietary supplements you use. Also tell them if you smoke,  drink alcohol, or use illegal drugs. Some items may interact with your medicine. ?What should I watch for while using this medication? ?Visit your health care provider for regular checks on your progress. It may be some time before you see the benefit from this drug. ?Some people who take this drug have severe bone, joint, or muscle pain. This drug may also increase your risk for jaw problems or a broken thigh bone. Tell your health care provider right away if you have severe pain in your jaw, bones, joints, or muscles. Tell you health care provider if you have any pain that does not go away or that gets worse. ?Tell your dentist and dental surgeon that you are taking this drug. You should not have major dental surgery while on this drug. See your dentist to have a dental exam and fix any dental problems before starting this drug. Take good care of your teeth while on this drug. Make sure you see your dentist for regular follow-up appointments. ?You should make sure you get enough calcium and vitamin D while you are taking this drug. Discuss the foods you eat and the vitamins you take with your health care provider. ?You may need blood work done while you are taking this drug. ?Do not become pregnant while taking this drug. Women should inform their health care provider if they wish to become pregnant or think they might be pregnant. There is potential for serious harm to an unborn child. Talk to your health care provider for more information. ?What side effects may I notice from receiving this medication? ?Side effects that you should report to your doctor or health care   provider as soon as possible: ?allergic reactions (skin rash, itching or hives; swelling of the face, lips, or tongue) ?bleeding (bloody or black, tarry stools; red or dark brown urine; spitting up blood or brown material that looks like coffee grounds; red spots on the skin; unusual bruising or bleeding from the eyes, gums, or nose) ?bone  pain ?increased thirst ?infection (fever, chills, cough, sore throat, pain or trouble passing urine) ?jaw pain, especially after dental work ?joint pain ?kidney injury (trouble passing urine or change in the amount of urine) ?low calcium levels (fast heartbeat; muscle cramps or pain; pain, tingling, or numbness in the hands or feet; seizures) ?low magnesium levels (fast, irregular heartbeat; muscle cramp or pain; muscle weakness; tremors; seizures) ?low potassium levels (trouble breathing; chest pain; dizziness; fast, irregular heartbeat; feeling faint or lightheaded, falls; muscle cramps or pain) ?muscle pain ?pain, redness, or irritation at site where injected ?redness, blistering, peeling, or loosening of the skin, including inside the mouth ?severe diarrhea ?unusual sweating ?Side effects that usually do not require medical attention (report to your doctor or health care provider if they continue or are bothersome): ?constipation ?eye irritation, itching, or pain ?fever ?headache ?increase in blood pressure ?loss of appetite ?nausea ?stomach pain ?unusually weak or tired ?vomiting ?This list may not describe all possible side effects. Call your doctor for medical advice about side effects. You may report side effects to FDA at 1-800-FDA-1088. ?Where should I keep my medication? ?This drug is given in a hospital or clinic. It will not be stored at home. ?NOTE: This sheet is a summary. It may not cover all possible information. If you have questions about this medicine, talk to your doctor, pharmacist, or health care provider. ?? 2022 Elsevier/Gold Standard (2021-08-16 00:00:00) ? ?

## 2022-03-17 NOTE — Discharge Instructions (Addendum)
Please return to the ED with any new or worsening symptoms such as increased chest pain, shortness of breath ?Please follow-up with your PCP for any ongoing needs ?Please continue using albuterol inhaler if you feel short of breath ? ?

## 2022-03-17 NOTE — Progress Notes (Signed)
Per Lorenso Courier MD, finish Aredia transfusion and transport pt to the [facility] for evaluation afterward.  ? ?RN gave beside report to receiving nurse in the ED. ?

## 2022-03-30 ENCOUNTER — Encounter: Payer: Self-pay | Admitting: Hematology

## 2022-03-30 DIAGNOSIS — Z95828 Presence of other vascular implants and grafts: Secondary | ICD-10-CM | POA: Insufficient documentation

## 2022-03-31 ENCOUNTER — Inpatient Hospital Stay: Payer: BC Managed Care – PPO

## 2022-03-31 ENCOUNTER — Other Ambulatory Visit: Payer: Self-pay

## 2022-03-31 VITALS — BP 115/75 | HR 87 | Temp 98.4°F | Resp 18 | Wt 154.5 lb

## 2022-03-31 DIAGNOSIS — Z7189 Other specified counseling: Secondary | ICD-10-CM

## 2022-03-31 DIAGNOSIS — C9001 Multiple myeloma in remission: Secondary | ICD-10-CM | POA: Diagnosis not present

## 2022-03-31 DIAGNOSIS — Z5111 Encounter for antineoplastic chemotherapy: Secondary | ICD-10-CM

## 2022-03-31 DIAGNOSIS — C9010 Plasma cell leukemia not having achieved remission: Secondary | ICD-10-CM

## 2022-03-31 DIAGNOSIS — C9011 Plasma cell leukemia in remission: Secondary | ICD-10-CM | POA: Insufficient documentation

## 2022-03-31 DIAGNOSIS — Z95828 Presence of other vascular implants and grafts: Secondary | ICD-10-CM

## 2022-03-31 DIAGNOSIS — M84551A Pathological fracture in neoplastic disease, right femur, initial encounter for fracture: Secondary | ICD-10-CM

## 2022-03-31 LAB — CBC WITH DIFFERENTIAL/PLATELET
Abs Immature Granulocytes: 0.06 10*3/uL (ref 0.00–0.07)
Basophils Absolute: 0.1 10*3/uL (ref 0.0–0.1)
Basophils Relative: 1 %
Eosinophils Absolute: 0.3 10*3/uL (ref 0.0–0.5)
Eosinophils Relative: 3 %
HCT: 33.3 % — ABNORMAL LOW (ref 39.0–52.0)
Hemoglobin: 11.2 g/dL — ABNORMAL LOW (ref 13.0–17.0)
Immature Granulocytes: 1 %
Lymphocytes Relative: 20 %
Lymphs Abs: 1.7 10*3/uL (ref 0.7–4.0)
MCH: 31.6 pg (ref 26.0–34.0)
MCHC: 33.6 g/dL (ref 30.0–36.0)
MCV: 94.1 fL (ref 80.0–100.0)
Monocytes Absolute: 0.6 10*3/uL (ref 0.1–1.0)
Monocytes Relative: 7 %
Neutro Abs: 5.8 10*3/uL (ref 1.7–7.7)
Neutrophils Relative %: 68 %
Platelets: 285 10*3/uL (ref 150–400)
RBC: 3.54 MIL/uL — ABNORMAL LOW (ref 4.22–5.81)
RDW: 15.6 % — ABNORMAL HIGH (ref 11.5–15.5)
WBC: 8.5 10*3/uL (ref 4.0–10.5)
nRBC: 0 % (ref 0.0–0.2)

## 2022-03-31 LAB — CMP (CANCER CENTER ONLY)
ALT: 10 U/L (ref 0–44)
AST: 10 U/L — ABNORMAL LOW (ref 15–41)
Albumin: 3.8 g/dL (ref 3.5–5.0)
Alkaline Phosphatase: 77 U/L (ref 38–126)
Anion gap: 7 (ref 5–15)
BUN: 28 mg/dL — ABNORMAL HIGH (ref 8–23)
CO2: 26 mmol/L (ref 22–32)
Calcium: 8.9 mg/dL (ref 8.9–10.3)
Chloride: 107 mmol/L (ref 98–111)
Creatinine: 1.73 mg/dL — ABNORMAL HIGH (ref 0.61–1.24)
GFR, Estimated: 44 mL/min — ABNORMAL LOW (ref >60)
Glucose, Bld: 121 mg/dL — ABNORMAL HIGH (ref 70–99)
Potassium: 4.3 mmol/L (ref 3.5–5.1)
Sodium: 140 mmol/L (ref 135–145)
Total Bilirubin: 0.3 mg/dL (ref 0.3–1.2)
Total Protein: 7.1 g/dL (ref 6.5–8.1)

## 2022-03-31 MED ORDER — SODIUM CHLORIDE 0.9% FLUSH
10.0000 mL | Freq: Once | INTRAVENOUS | Status: AC
  Administered 2022-03-31: 10 mL

## 2022-03-31 MED ORDER — SODIUM CHLORIDE 0.9 % IV SOLN
20.0000 mg | Freq: Once | INTRAVENOUS | Status: AC
  Administered 2022-03-31: 20 mg via INTRAVENOUS
  Filled 2022-03-31: qty 20

## 2022-03-31 MED ORDER — SODIUM CHLORIDE 0.9% FLUSH
10.0000 mL | INTRAVENOUS | Status: DC | PRN
  Administered 2022-03-31: 10 mL

## 2022-03-31 MED ORDER — SODIUM CHLORIDE 0.9 % IV SOLN: INTRAVENOUS | Status: AC

## 2022-03-31 MED ORDER — FAMOTIDINE 20 MG PO TABS
20.0000 mg | ORAL_TABLET | Freq: Once | ORAL | Status: AC
  Administered 2022-03-31: 20 mg via ORAL
  Filled 2022-03-31: qty 1

## 2022-03-31 MED ORDER — DEXTROSE 5 % IV SOLN
56.0000 mg/m2 | Freq: Once | INTRAVENOUS | Status: AC
  Administered 2022-03-31: 100 mg via INTRAVENOUS
  Filled 2022-03-31: qty 30

## 2022-03-31 MED ORDER — HEPARIN SOD (PORK) LOCK FLUSH 100 UNIT/ML IV SOLN
500.0000 [IU] | Freq: Once | INTRAVENOUS | Status: AC | PRN
  Administered 2022-03-31: 500 [IU]

## 2022-03-31 MED ORDER — ACETAMINOPHEN 325 MG PO TABS
650.0000 mg | ORAL_TABLET | Freq: Once | ORAL | Status: AC
  Administered 2022-03-31: 650 mg via ORAL
  Filled 2022-03-31: qty 2

## 2022-03-31 MED ORDER — SODIUM CHLORIDE 0.9 % IV SOLN: Freq: Once | INTRAVENOUS | Status: AC

## 2022-03-31 NOTE — Patient Instructions (Signed)
[facility]  Discharge Instructions: Thank you for choosing [facility] to provide your oncology and hematology care.   If you have a lab appointment with the Cancer Center, please go directly to the Cancer Center and check in at the registration area.   Wear comfortable clothing and clothing appropriate for easy access to any Portacath or PICC line.   We strive to give you quality time with your provider. You may need to reschedule your appointment if you arrive late (15 or more minutes).  Arriving late affects you and other patients whose appointments are after yours.  Also, if you miss three or more appointments without notifying the office, you may be dismissed from the clinic at the provider's discretion.      For prescription refill requests, have your pharmacy contact our office and allow 72 hours for refills to be completed.    Today you received the following chemotherapy and/or immunotherapy agents: Kyprolis    To help prevent nausea and vomiting after your treatment, we encourage you to take your nausea medication as directed.  BELOW ARE SYMPTOMS THAT SHOULD BE REPORTED IMMEDIATELY: . *FEVER GREATER THAN 100.4 F (38 C) OR HIGHER . *CHILLS OR SWEATING . *NAUSEA AND VOMITING THAT IS NOT CONTROLLED WITH YOUR NAUSEA MEDICATION . *UNUSUAL SHORTNESS OF BREATH . *UNUSUAL BRUISING OR BLEEDING . *URINARY PROBLEMS (pain or burning when urinating, or frequent urination) . *BOWEL PROBLEMS (unusual diarrhea, constipation, pain near the anus) . TENDERNESS IN MOUTH AND THROAT WITH OR WITHOUT PRESENCE OF ULCERS (sore throat, sores in mouth, or a toothache) . UNUSUAL RASH, SWELLING OR PAIN  . UNUSUAL VAGINAL DISCHARGE OR ITCHING   Items with * indicate a potential emergency and should be followed up as soon as possible or go to the Emergency Department if any problems should occur.  Please show the CHEMOTHERAPY ALERT CARD or IMMUNOTHERAPY ALERT  CARD at check-in to the Emergency Department and triage nurse.  Should you have questions after your visit or need to cancel or reschedule your appointment, please contact [facility]  Dept: [phone]  and follow the prompts.  Office hours are 8:00 a.m. to 4:30 p.m. Monday - Friday. Please note that voicemails left after 4:00 p.m. may not be returned until the following business day.  We are closed weekends and major holidays. You have access to a nurse at all times for urgent questions. Please call the main number to the clinic Dept: [phone] and follow the prompts.   For any non-urgent questions, you may also contact your provider using MyChart. We now offer e-Visits for anyone 18 and older to request care online for non-urgent symptoms. For details visit [email or web address].   Also download the MyChart app! Go to the app store, search "MyChart", open the app, select [facility], and log in with your MyChart username and password.  Due to Covid, a mask is required upon entering the hospital/clinic. If you do not have a mask, one will be given to you upon arrival. For doctor visits, patients may have 1 support person aged 18 or older with them. For treatment visits, patients cannot have anyone with them due to current Covid guidelines and our immunocompromised population.   

## 2022-03-31 NOTE — Progress Notes (Signed)
There was a clarification issue with this patient's orders today. See pharmacy's note, as well. Patient will have an official appointment in the future scheduled with Dr. Irene Limbo (who last saw patient in infusion because he was ill). The kyprolis fluids were put back to the 30 minutes of normal saline for 228m before the kyprolis as is standard for today's treatment. ?

## 2022-03-31 NOTE — Progress Notes (Signed)
Per Shawn Stall, RN - pt will be sched for OV w/ Dr. Irene Limbo soon. ?Dr. Irene Limbo saw pt in infusion recently and gave ok to proceed today w/ Kyprolis. ? ?Kennith Center, Pharm.D., CPP ?03/31/2022'@10'$ :46 AM ? ? ?

## 2022-03-31 NOTE — Progress Notes (Signed)
Patient has a Scr of 1.73 today- discussed with Dr. Irene Limbo and was given ok to proceed with treatment. ?

## 2022-04-13 ENCOUNTER — Other Ambulatory Visit: Payer: Self-pay

## 2022-04-13 DIAGNOSIS — C9010 Plasma cell leukemia not having achieved remission: Secondary | ICD-10-CM

## 2022-04-13 MED ORDER — LENALIDOMIDE 10 MG PO CAPS: 10.0000 mg | ORAL_CAPSULE | Freq: Every day | ORAL | 0 refills | Status: DC

## 2022-04-14 ENCOUNTER — Inpatient Hospital Stay: Payer: BC Managed Care – PPO | Attending: Hematology

## 2022-04-14 ENCOUNTER — Inpatient Hospital Stay (HOSPITAL_BASED_OUTPATIENT_CLINIC_OR_DEPARTMENT_OTHER): Payer: BC Managed Care – PPO | Admitting: Hematology

## 2022-04-14 ENCOUNTER — Inpatient Hospital Stay: Payer: BC Managed Care – PPO

## 2022-04-14 ENCOUNTER — Inpatient Hospital Stay: Payer: BC Managed Care – PPO | Admitting: Dietician

## 2022-04-14 ENCOUNTER — Other Ambulatory Visit: Payer: Self-pay

## 2022-04-14 VITALS — BP 126/67 | HR 73 | Temp 98.2°F | Resp 18 | Wt 158.0 lb

## 2022-04-14 DIAGNOSIS — C9010 Plasma cell leukemia not having achieved remission: Secondary | ICD-10-CM

## 2022-04-14 DIAGNOSIS — M84551A Pathological fracture in neoplastic disease, right femur, initial encounter for fracture: Secondary | ICD-10-CM

## 2022-04-14 DIAGNOSIS — Z7189 Other specified counseling: Secondary | ICD-10-CM

## 2022-04-14 DIAGNOSIS — C9000 Multiple myeloma not having achieved remission: Secondary | ICD-10-CM | POA: Insufficient documentation

## 2022-04-14 DIAGNOSIS — Z95828 Presence of other vascular implants and grafts: Secondary | ICD-10-CM

## 2022-04-14 DIAGNOSIS — Z5111 Encounter for antineoplastic chemotherapy: Secondary | ICD-10-CM | POA: Insufficient documentation

## 2022-04-14 LAB — CMP (CANCER CENTER ONLY)
ALT: 12 U/L (ref 0–44)
AST: 13 U/L — ABNORMAL LOW (ref 15–41)
Albumin: 4 g/dL (ref 3.5–5.0)
Alkaline Phosphatase: 67 U/L (ref 38–126)
Anion gap: 6 (ref 5–15)
BUN: 22 mg/dL (ref 8–23)
CO2: 26 mmol/L (ref 22–32)
Calcium: 9.2 mg/dL (ref 8.9–10.3)
Chloride: 108 mmol/L (ref 98–111)
Creatinine: 1.64 mg/dL — ABNORMAL HIGH (ref 0.61–1.24)
GFR, Estimated: 46 mL/min — ABNORMAL LOW (ref >60)
Glucose, Bld: 98 mg/dL (ref 70–99)
Potassium: 4.4 mmol/L (ref 3.5–5.1)
Sodium: 140 mmol/L (ref 135–145)
Total Bilirubin: 0.4 mg/dL (ref 0.3–1.2)
Total Protein: 7.3 g/dL (ref 6.5–8.1)

## 2022-04-14 LAB — CBC WITH DIFFERENTIAL/PLATELET
Abs Immature Granulocytes: 0.02 10*3/uL (ref 0.00–0.07)
Basophils Absolute: 0.1 10*3/uL (ref 0.0–0.1)
Basophils Relative: 1 %
Eosinophils Absolute: 0.4 10*3/uL (ref 0.0–0.5)
Eosinophils Relative: 7 %
HCT: 34.5 % — ABNORMAL LOW (ref 39.0–52.0)
Hemoglobin: 11.6 g/dL — ABNORMAL LOW (ref 13.0–17.0)
Immature Granulocytes: 0 %
Lymphocytes Relative: 27 %
Lymphs Abs: 1.7 10*3/uL (ref 0.7–4.0)
MCH: 31.5 pg (ref 26.0–34.0)
MCHC: 33.6 g/dL (ref 30.0–36.0)
MCV: 93.8 fL (ref 80.0–100.0)
Monocytes Absolute: 0.9 10*3/uL (ref 0.1–1.0)
Monocytes Relative: 13 %
Neutro Abs: 3.3 10*3/uL (ref 1.7–7.7)
Neutrophils Relative %: 52 %
Platelets: 232 10*3/uL (ref 150–400)
RBC: 3.68 MIL/uL — ABNORMAL LOW (ref 4.22–5.81)
RDW: 16.7 % — ABNORMAL HIGH (ref 11.5–15.5)
WBC: 6.4 10*3/uL (ref 4.0–10.5)
nRBC: 0 % (ref 0.0–0.2)

## 2022-04-14 MED ORDER — SODIUM CHLORIDE 0.9% FLUSH: 3.0000 mL | Freq: Once | INTRAVENOUS | Status: DC | PRN

## 2022-04-14 MED ORDER — FAMOTIDINE 20 MG PO TABS
20.0000 mg | ORAL_TABLET | Freq: Once | ORAL | Status: AC
  Administered 2022-04-14: 20 mg via ORAL
  Filled 2022-04-14: qty 1

## 2022-04-14 MED ORDER — SODIUM CHLORIDE 0.9% FLUSH
10.0000 mL | Freq: Once | INTRAVENOUS | Status: AC
  Administered 2022-04-14: 10 mL

## 2022-04-14 MED ORDER — DEXTROSE 5 % IV SOLN
56.0000 mg/m2 | Freq: Once | INTRAVENOUS | Status: AC
  Administered 2022-04-14: 100 mg via INTRAVENOUS
  Filled 2022-04-14: qty 30

## 2022-04-14 MED ORDER — HEPARIN SOD (PORK) LOCK FLUSH 100 UNIT/ML IV SOLN: 500.0000 [IU] | Freq: Once | INTRAVENOUS | Status: DC | PRN

## 2022-04-14 MED ORDER — ACETAMINOPHEN 325 MG PO TABS
650.0000 mg | ORAL_TABLET | Freq: Once | ORAL | Status: AC
  Administered 2022-04-14: 650 mg via ORAL
  Filled 2022-04-14: qty 2

## 2022-04-14 MED ORDER — HEPARIN SOD (PORK) LOCK FLUSH 100 UNIT/ML IV SOLN
500.0000 [IU] | Freq: Once | INTRAVENOUS | Status: AC
  Administered 2022-04-14: 500 [IU]

## 2022-04-14 MED ORDER — ALTEPLASE 2 MG IJ SOLR: 2.0000 mg | Freq: Once | INTRAMUSCULAR | Status: DC | PRN

## 2022-04-14 MED ORDER — SODIUM CHLORIDE 0.9 % IV SOLN: Freq: Once | INTRAVENOUS | Status: AC

## 2022-04-14 MED ORDER — HEPARIN SOD (PORK) LOCK FLUSH 100 UNIT/ML IV SOLN: 250.0000 [IU] | Freq: Once | INTRAVENOUS | Status: DC | PRN

## 2022-04-14 MED ORDER — SODIUM CHLORIDE 0.9% FLUSH: 10.0000 mL | INTRAVENOUS | Status: DC | PRN

## 2022-04-14 MED ORDER — SODIUM CHLORIDE 0.9 % IV SOLN: INTRAVENOUS | Status: AC

## 2022-04-14 MED ORDER — SODIUM CHLORIDE 0.9% FLUSH: 10.0000 mL | Freq: Once | INTRAVENOUS | Status: DC | PRN

## 2022-04-14 MED ORDER — SODIUM CHLORIDE 0.9 % IV SOLN
20.0000 mg | Freq: Once | INTRAVENOUS | Status: AC
  Administered 2022-04-14: 20 mg via INTRAVENOUS
  Filled 2022-04-14: qty 20

## 2022-04-14 NOTE — Progress Notes (Signed)
Nutrition Assessment ? ? ?Reason for Assessment: MST ? ? 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 He is currently receiving Kyprolis + Dexamethasone weekly q28d. Patient is under the care of Dr. Irene Limbo. ? ?Past medical history includes BPH, HTN, Hep C, COPD, tobacco use.  ?-4/7 d/c from emergency department after presenting from cancer center due to shortness of breath ? ?Met with patient during infusion. He is reclined in chair eating chips and watching television. Patient reports good appetite. He reports having pneumonia a couple months ago and endorses not up to eating much at that time. This has resolved. Fraser Din is feeling well, reports regularly eating 3 meals plus snacks. Yesterday pt recalls eggs, sausage, biscuit with jelly for breakfast, coke zero and couple of cookies mid morning, chicken and rice for lunch and again for supper. Had a banana and couple of cookies before going to bed. Patient is drinking ~64 ounces of water. He denies nutrition impact symptoms.  ? ? ?Nutrition Focused Physical Exam: no fat/muscle depletions  ? ? ?Medications: acyclovir, vit D, MVI, zofran, drisdol ? ? ?Labs: Cr 1.64 ? ? ?Anthropometrics:  ? ?Height: 5'8" ?Weight: 158 lb  ?UBW: 155-160 lb (per pt he has never been able to get above 160 lb no matter how hard he tries) ?BMI: 24.02 ? ?NUTRITION DIAGNOSIS: No nutrition diagnosis at this time  ? ? ?INTERVENTION:  ?Educated on foods with protein and to include source of protein with meals and snacks - handout with ideas provided ?Strive for weight maintenance  ?Contact information provided ? ?MONITORING, EVALUATION, GOAL: Patient will provide adequate intake of calories and protein to minimize weight loss  ? ? ?Next Visit: No follow-up scheduled. Patient provided contact information. Encouraged pt to contact with nutrition related questions/concerns ? ? ? ? ? ? ?

## 2022-04-14 NOTE — Progress Notes (Signed)
Per Dr. Irene Limbo, okay to treat today with creatinine of 1.64 mg/dL ?

## 2022-04-14 NOTE — Patient Instructions (Signed)
[facility]  Discharge Instructions: Thank you for choosing [facility] to provide your oncology and hematology care.   If you have a lab appointment with the Cancer Center, please go directly to the Cancer Center and check in at the registration area.   Wear comfortable clothing and clothing appropriate for easy access to any Portacath or PICC line.   We strive to give you quality time with your provider. You may need to reschedule your appointment if you arrive late (15 or more minutes).  Arriving late affects you and other patients whose appointments are after yours.  Also, if you miss three or more appointments without notifying the office, you may be dismissed from the clinic at the provider's discretion.      For prescription refill requests, have your pharmacy contact our office and allow 72 hours for refills to be completed.    Today you received the following chemotherapy and/or immunotherapy agents: Kyprolis    To help prevent nausea and vomiting after your treatment, we encourage you to take your nausea medication as directed.  BELOW ARE SYMPTOMS THAT SHOULD BE REPORTED IMMEDIATELY: . *FEVER GREATER THAN 100.4 F (38 C) OR HIGHER . *CHILLS OR SWEATING . *NAUSEA AND VOMITING THAT IS NOT CONTROLLED WITH YOUR NAUSEA MEDICATION . *UNUSUAL SHORTNESS OF BREATH . *UNUSUAL BRUISING OR BLEEDING . *URINARY PROBLEMS (pain or burning when urinating, or frequent urination) . *BOWEL PROBLEMS (unusual diarrhea, constipation, pain near the anus) . TENDERNESS IN MOUTH AND THROAT WITH OR WITHOUT PRESENCE OF ULCERS (sore throat, sores in mouth, or a toothache) . UNUSUAL RASH, SWELLING OR PAIN  . UNUSUAL VAGINAL DISCHARGE OR ITCHING   Items with * indicate a potential emergency and should be followed up as soon as possible or go to the Emergency Department if any problems should occur.  Please show the CHEMOTHERAPY ALERT CARD or IMMUNOTHERAPY ALERT  CARD at check-in to the Emergency Department and triage nurse.  Should you have questions after your visit or need to cancel or reschedule your appointment, please contact [facility]  Dept: [phone]  and follow the prompts.  Office hours are 8:00 a.m. to 4:30 p.m. Monday - Friday. Please note that voicemails left after 4:00 p.m. may not be returned until the following business day.  We are closed weekends and major holidays. You have access to a nurse at all times for urgent questions. Please call the main number to the clinic Dept: [phone] and follow the prompts.   For any non-urgent questions, you may also contact your provider using MyChart. We now offer e-Visits for anyone 18 and older to request care online for non-urgent symptoms. For details visit [email or web address].   Also download the MyChart app! Go to the app store, search "MyChart", open the app, select [place], and log in with your MyChart username and password.  Due to Covid, a mask is required upon entering the hospital/clinic. If you do not have a mask, one will be given to you upon arrival. For doctor visits, patients may have 1 support person aged 18 or older with them. For treatment visits, patients cannot have anyone with them due to current Covid guidelines and our immunocompromised population.   

## 2022-04-17 ENCOUNTER — Encounter: Payer: Self-pay | Admitting: Hematology

## 2022-04-17 ENCOUNTER — Telehealth: Payer: Self-pay | Admitting: Hematology

## 2022-04-17 LAB — KAPPA/LAMBDA LIGHT CHAINS
Kappa free light chain: 36.1 mg/L — ABNORMAL HIGH (ref 3.3–19.4)
Kappa, lambda light chain ratio: 1.89 — ABNORMAL HIGH (ref 0.26–1.65)
Lambda free light chains: 19.1 mg/L (ref 5.7–26.3)

## 2022-04-17 NOTE — Telephone Encounter (Signed)
Left message with follow-up appointments per 5/5 los. ?

## 2022-04-17 NOTE — Progress Notes (Signed)
? ? ?HEMATOLOGY/ONCOLOGY OUTPATIENT PROGRESS NOTE ? ?Date of Service: .04/14/2022 ? 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 ?HISTORY OF PRESENTING ILLNESS:  ?Please see previous note for details of initial presentation. ? ?INTERVAL HISTORY:  ? ?[facility] is here for continued evaluation and management of his plasma cell leukemia/multiple myeloma. ?He notes no acute new symptoms since his last clinic visit. ?No infection issues.  No fevers no chills no night sweats.  No other new focal bone pains. ?He reports no significant new toxicities from his current aggressive maintenance regimen with carfilzomib and Revlimid. ?Notes mild intermittent loose stools and some grade 1 muscle cramps from Revlimid. ?No new skin rashes. ?No concerns with shingles. ?No new dental issues. ?He notes overall that he is feeling well and plans to travel to [place] with his fianc?e to see [employer]. ?Labs done today were reviewed in detail with him. ? ?REVIEW OF SYSTEMS:   ?10 Point review of Systems was done is negative except as noted above. ? ?MEDICAL HISTORY:  ?Past Medical History:  ?Diagnosis Date  ? BPH (benign prostatic hyperplasia)   ? Hepatitis C 11/27/2017  ? Hypertension   ? Plasma cell leukemia ([place]) 11/23/2017  ? Tobacco abuse   ? ? ?SURGICAL HISTORY: ?Past Surgical History:  ?Procedure Laterality Date  ? APPENDECTOMY    ? FEMUR IM NAIL Right 11/20/2017  ? Procedure: RIGHT FEMORAL INTRAMEDULLARY (IM) NAIL;  Surgeon: Nicholes Stairs, MD;  Location: [facility];  Service: Orthopedics;  Laterality: Right;  ? IR IMAGING GUIDED PORT INSERTION  08/19/2021  ? ? ?SOCIAL HISTORY: ?Social History  ? ?Socioeconomic History  ? Marital status: Single  ?  Spouse name: Not on file  ? Number of children: Not on file  ? Years of education: Not on file  ? Highest education level: Not on file  ?Occupational History  ? Not on file  ?Tobacco Use  ? Smoking status: Every Day  ?   Packs/day: 0.25  ?  Types: Cigarettes  ?  Last attempt to quit: 11/09/2017  ?  Years since quitting: 4.4  ? Smokeless tobacco: Never  ? Tobacco comments:  ?  1 cigarette/day as of 09/25/18  ?Vaping Use  ? Vaping Use: Never used  ?Substance and Sexual Activity  ? Alcohol use: Yes  ? Drug use: No  ? Sexual activity: Not on file  ?Other Topics Concern  ? Not on file  ?Social History Narrative  ? Not on file  ? ?Social Determinants of Health  ? ?Financial Resource Strain: Not on file  ?Food Insecurity: Not on file  ?Transportation Needs: Not on file  ?Physical Activity: Not on file  ?Stress: Not on file  ?Social Connections: Not on file  ?Intimate Partner Violence: Not on file  ? ? ?FAMILY HISTORY: ?Family History  ?Problem Relation Age of Onset  ? COPD Mother   ? Liver cancer Mother   ? ? ?ALLERGIES:  has No Known Allergies. ? ?MEDICATIONS:  ?. ?Current Outpatient Medications on File Prior to Visit  ?Medication Sig Dispense Refill  ? acyclovir (ZOVIRAX) 400 MG tablet Take 1 tablet (400 mg total) by mouth daily. 30 tablet 11  ? Albuterol Sulfate (PROAIR RESPICLICK) 834 (90 Base) MCG/ACT AEPB Inhale into the lungs.    ? amoxicillin (AMOXIL) 500 MG capsule Take 500 mg by mouth 3 (three) times daily.    ? aspirin EC 81 MG tablet Take 1 tablet (81 mg total) by mouth  daily. (Patient taking differently: Take 81 mg by mouth at bedtime.) 60 tablet 11  ? cholecalciferol (VITAMIN D) 400 units TABS tablet Take 400 Units by mouth at bedtime.     ? FLOVENT DISKUS 250 MCG/ACT AEPB Inhale into the lungs.    ? ipratropium-albuterol (DUONEB) 0.5-2.5 (3) MG/3ML SOLN Inhale into the lungs.    ? lenalidomide (REVLIMID) 10 MG capsule Take 1 capsule (10 mg total) by mouth daily. Take for 21 days on, 7 days off, repeat every 28 days. 21 capsule 0  ? lidocaine-prilocaine (EMLA) cream Apply 1 application topically as needed. Apply to Port-A-Cath at least 1 hr prior to treatment. 30 g 0  ? Multiple Vitamins-Minerals (MULTIVITAMIN WITH MINERALS)  tablet Take 1 tablet by mouth at bedtime.     ? nicotine (NICODERM CQ - DOSED IN MG/24 HOURS) 14 mg/24hr patch PLACE 1 PATCH (14 MG TOTAL) ONTO THE SKIN DAILY. 28 patch 1  ? ondansetron (ZOFRAN) 4 MG tablet Take 2 tablets (8 mg total) by mouth every 8 (eight) hours as needed for nausea. 30 tablet 0  ? Vitamin D, Ergocalciferol, (DRISDOL) 1.25 MG (50000 UNIT) CAPS capsule Take 1 capsule (50,000 Units total) by mouth once a week. 12 capsule 6  ? ?Current Facility-Administered Medications on File Prior to Visit  ?Medication Dose Route Frequency Provider Last Rate Last Admin  ? 0.9 %  sodium chloride infusion   Intravenous Once Brunetta Genera, MD      ? sodium chloride flush (NS) 0.9 % injection 10 mL  10 mL Intracatheter PRN Brunetta Genera, MD   10 mL at 09/16/21 1623  ? ? ?PHYSICAL EXAMINATION:  ?ECOG FS:1 - Symptomatic but completely ambulatory ? ?Vital signs stable ?Wt Readings from Last 3 Encounters:  ?04/14/22 158 lb (71.7 kg)  ?03/31/22 154 lb 8 oz (70.1 kg)  ?03/17/22 152 lb 1.9 oz (69 kg)  ? ?NAD ?GENERAL:alert, in no acute distress and comfortable ?SKIN: no acute rashes, no significant lesions ?EYES: conjunctiva are pink and non-injected, sclera anicteric ?OROPHARYNX: MMM, no exudates, no oropharyngeal erythema or ulceration ?NECK: supple, no JVD ?LYMPH:  no palpable lymphadenopathy in the cervical, axillary or inguinal regions ?LUNGS: clear to auscultation b/l with normal respiratory effort ?HEART: regular rate & rhythm ?ABDOMEN:  normoactive bowel sounds , non tender, not distended. ?Extremity: no pedal edema ?PSYCH: alert & oriented x 3 with fluent speech ?NEURO: no focal motor/sensory deficits ? ? ? ?LABORATORY DATA:  ?I have reviewed the data as listed ? ? ?  Latest Ref Rng & Units 04/14/2022  ?  9:07 AM 03/31/2022  ?  9:34 AM 03/17/2022  ?  3:44 PM  ?CBC  ?WBC 4.0 - 10.5 K/uL 6.4   8.5   6.9    ?Hemoglobin 13.0 - 17.0 g/dL 11.6   11.2   11.1    ?Hematocrit 39.0 - 52.0 % 34.5   33.3   32.7     ?Platelets 150 - 400 K/uL 232   285   216    ? ?CBC ?   ?Component Value Date/Time  ? WBC 6.4 04/14/2022 0907  ? RBC 3.68 (L) 04/14/2022 6948  ? HGB 11.6 (L) 04/14/2022 5462  ? HGB 11.9 (L) 07/08/2021 1009  ? HGB 8.3 (L) 12/14/2017 1230  ? HCT 34.5 (L) 04/14/2022 7035  ? HCT 23.9 (L) 12/14/2017 1230  ? PLT 232 04/14/2022 0907  ? PLT 211 07/08/2021 1009  ? PLT 524 (H) 12/14/2017 1230  ? MCV 93.8 04/14/2022  4373  ? MCV 88.4 12/14/2017 1230  ? MCH 31.5 04/14/2022 0907  ? MCHC 33.6 04/14/2022 0907  ? RDW 16.7 (H) 04/14/2022 5789  ? RDW 15.3 (H) 12/14/2017 1230  ? LYMPHSABS 1.7 04/14/2022 0907  ? LYMPHSABS 1.4 12/14/2017 1230  ? MONOABS 0.9 04/14/2022 0907  ? MONOABS 0.6 12/14/2017 1230  ? EOSABS 0.4 04/14/2022 0907  ? EOSABS 0.1 12/14/2017 1230  ? BASOSABS 0.1 04/14/2022 0907  ? BASOSABS 0.1 12/14/2017 1230  ? ? ? ? ?  Latest Ref Rng & Units 04/14/2022  ?  9:07 AM 03/31/2022  ?  9:34 AM 03/17/2022  ?  3:44 PM  ?CMP  ?Glucose 70 - 99 mg/dL 98   121   106    ?BUN 8 - 23 mg/dL _0 ?Creatinine 0.61 - 1.24 mg/dL 1.64   1.73   1.64    ?Sodium 135 - 145 mmol/L 140   140   140    ?Potassium 3.5 - 5.1 mmol/L 4.4   4.3   4.0    ?Chloride 98 - 111 mmol/L 108   107   112    ?CO2 22 - 32 mmol/L _1 ?Calcium 8.9 - 10.3 mg/dL 9.2   8.9   7.9    ?Total Protein 6.5 - 8.1 g/dL 7.3   7.1   6.9    ?Total Bilirubin 0.3 - 1.2 mg/dL 0.4   0.3   0.4    ?Alkaline Phos 38 - 126 U/L 67   77   63    ?AST 15 - 41 U/L _2 ?ALT 0 - 44 U/L _3 ?  ? ? ? ? ? ?03/09/20 [id number]) Bone Marrow Biopsy at [facility] ? ? ?07/02/2019 Bone Marrow Biopsy ([id number]) at [facility] ? ? ? ?08/07/18 BM Bx: ? ? ?  ? ? ? ?RADIOGRAPHIC STUDIES: ?I have personally reviewed the radiological images as listed and agreed with the findings in the report. ?No results found. ? ?ASSESSMENT & PLAN:  ?  ?66 y.o. male presenting with  ?  ?1) Plasma Cell Leukemia/Multiple Myeloma producing Kappa light chains.- currently in  remission s/p tandem Auto HSCT ? ?-initial presentation with Hypercalcemia, Renal Failure, Anemia, Extensive Bone lesions ?-Appears to be primarily Kappa Light chain Myeloma with no overt M spike. ? ? Initial B

## 2022-04-18 LAB — MULTIPLE MYELOMA PANEL, SERUM
Albumin SerPl Elph-Mcnc: 3.7 g/dL (ref 2.9–4.4)
Albumin/Glob SerPl: 1.2 (ref 0.7–1.7)
Alpha 1: 0.3 g/dL (ref 0.0–0.4)
Alpha2 Glob SerPl Elph-Mcnc: 1.2 g/dL — ABNORMAL HIGH (ref 0.4–1.0)
B-Globulin SerPl Elph-Mcnc: 0.9 g/dL (ref 0.7–1.3)
Gamma Glob SerPl Elph-Mcnc: 0.9 g/dL (ref 0.4–1.8)
Globulin, Total: 3.2 g/dL (ref 2.2–3.9)
IgA: 95 mg/dL (ref 61–437)
IgG (Immunoglobin G), Serum: 902 mg/dL (ref 603–1613)
IgM (Immunoglobulin M), Srm: 16 mg/dL — ABNORMAL LOW (ref 20–172)
Total Protein ELP: 6.9 g/dL (ref 6.0–8.5)

## 2022-04-27 MED FILL — Dexamethasone Sodium Phosphate Inj 100 MG/10ML: INTRAMUSCULAR | Qty: 2 | Status: AC

## 2022-04-28 ENCOUNTER — Inpatient Hospital Stay: Payer: BC Managed Care – PPO

## 2022-04-28 ENCOUNTER — Other Ambulatory Visit: Payer: Self-pay

## 2022-04-28 VITALS — BP 113/80 | HR 74 | Temp 98.0°F | Resp 18 | Wt 156.1 lb

## 2022-04-28 DIAGNOSIS — Z5111 Encounter for antineoplastic chemotherapy: Secondary | ICD-10-CM

## 2022-04-28 DIAGNOSIS — Z95828 Presence of other vascular implants and grafts: Secondary | ICD-10-CM

## 2022-04-28 DIAGNOSIS — C9010 Plasma cell leukemia not having achieved remission: Secondary | ICD-10-CM

## 2022-04-28 DIAGNOSIS — Z7189 Other specified counseling: Secondary | ICD-10-CM

## 2022-04-28 DIAGNOSIS — M84551A Pathological fracture in neoplastic disease, right femur, initial encounter for fracture: Secondary | ICD-10-CM

## 2022-04-28 LAB — CBC WITH DIFFERENTIAL/PLATELET
Abs Immature Granulocytes: 0.02 10*3/uL (ref 0.00–0.07)
Basophils Absolute: 0.2 10*3/uL — ABNORMAL HIGH (ref 0.0–0.1)
Basophils Relative: 3 %
Eosinophils Absolute: 0.2 10*3/uL (ref 0.0–0.5)
Eosinophils Relative: 3 %
HCT: 34.1 % — ABNORMAL LOW (ref 39.0–52.0)
Hemoglobin: 11.8 g/dL — ABNORMAL LOW (ref 13.0–17.0)
Immature Granulocytes: 0 %
Lymphocytes Relative: 25 %
Lymphs Abs: 1.8 10*3/uL (ref 0.7–4.0)
MCH: 32.4 pg (ref 26.0–34.0)
MCHC: 34.6 g/dL (ref 30.0–36.0)
MCV: 93.7 fL (ref 80.0–100.0)
Monocytes Absolute: 0.9 10*3/uL (ref 0.1–1.0)
Monocytes Relative: 13 %
Neutro Abs: 4 10*3/uL (ref 1.7–7.7)
Neutrophils Relative %: 56 %
Platelets: 243 10*3/uL (ref 150–400)
RBC: 3.64 MIL/uL — ABNORMAL LOW (ref 4.22–5.81)
RDW: 16.2 % — ABNORMAL HIGH (ref 11.5–15.5)
WBC: 7.1 10*3/uL (ref 4.0–10.5)
nRBC: 0 % (ref 0.0–0.2)

## 2022-04-28 LAB — CMP (CANCER CENTER ONLY)
ALT: 13 U/L (ref 0–44)
AST: 16 U/L (ref 15–41)
Albumin: 4 g/dL (ref 3.5–5.0)
Alkaline Phosphatase: 66 U/L (ref 38–126)
Anion gap: 6 (ref 5–15)
BUN: 32 mg/dL — ABNORMAL HIGH (ref 8–23)
CO2: 24 mmol/L (ref 22–32)
Calcium: 8.6 mg/dL — ABNORMAL LOW (ref 8.9–10.3)
Chloride: 110 mmol/L (ref 98–111)
Creatinine: 1.73 mg/dL — ABNORMAL HIGH (ref 0.61–1.24)
GFR, Estimated: 44 mL/min — ABNORMAL LOW (ref >60)
Glucose, Bld: 118 mg/dL — ABNORMAL HIGH (ref 70–99)
Potassium: 4.4 mmol/L (ref 3.5–5.1)
Sodium: 140 mmol/L (ref 135–145)
Total Bilirubin: 0.4 mg/dL (ref 0.3–1.2)
Total Protein: 7 g/dL (ref 6.5–8.1)

## 2022-04-28 MED ORDER — FAMOTIDINE 20 MG PO TABS
20.0000 mg | ORAL_TABLET | Freq: Once | ORAL | Status: AC
  Administered 2022-04-28: 20 mg via ORAL
  Filled 2022-04-28: qty 1

## 2022-04-28 MED ORDER — SODIUM CHLORIDE 0.9% FLUSH
10.0000 mL | INTRAVENOUS | Status: DC | PRN
  Administered 2022-04-28: 10 mL

## 2022-04-28 MED ORDER — SODIUM CHLORIDE 0.9 % IV SOLN
20.0000 mg | Freq: Once | INTRAVENOUS | Status: AC
  Administered 2022-04-28: 20 mg via INTRAVENOUS
  Filled 2022-04-28: qty 20

## 2022-04-28 MED ORDER — SODIUM CHLORIDE 0.9 % IV SOLN: INTRAVENOUS | Status: AC

## 2022-04-28 MED ORDER — SODIUM CHLORIDE 0.9% FLUSH
10.0000 mL | Freq: Once | INTRAVENOUS | Status: AC
  Administered 2022-04-28: 10 mL

## 2022-04-28 MED ORDER — SODIUM CHLORIDE 0.9 % IV SOLN: Freq: Once | INTRAVENOUS | Status: AC

## 2022-04-28 MED ORDER — ACETAMINOPHEN 325 MG PO TABS
650.0000 mg | ORAL_TABLET | Freq: Once | ORAL | Status: AC
  Administered 2022-04-28: 650 mg via ORAL
  Filled 2022-04-28: qty 2

## 2022-04-28 MED ORDER — DEXTROSE 5 % IV SOLN
56.0000 mg/m2 | Freq: Once | INTRAVENOUS | Status: AC
  Administered 2022-04-28: 100 mg via INTRAVENOUS
  Filled 2022-04-28: qty 30

## 2022-04-28 MED ORDER — HEPARIN SOD (PORK) LOCK FLUSH 100 UNIT/ML IV SOLN
500.0000 [IU] | Freq: Once | INTRAVENOUS | Status: AC | PRN
  Administered 2022-04-28: 500 [IU]

## 2022-04-28 NOTE — Patient Instructions (Signed)
[facility]  Discharge Instructions: Thank you for choosing [facility] to provide your oncology and hematology care.   If you have a lab appointment with the [facility], please go directly to the [facility] and check in at the registration area.   Wear comfortable clothing and clothing appropriate for easy access to any Portacath or PICC line.   We strive to give you quality time with your provider. You may need to reschedule your appointment if you arrive late (15 or more minutes).  Arriving late affects you and other patients whose appointments are after yours.  Also, if you miss three or more appointments without notifying the office, you may be dismissed from the clinic at the provider's discretion.      For prescription refill requests, have your pharmacy contact our office and allow 72 hours for refills to be completed.    Today you received the following chemotherapy and/or immunotherapy agents: Kyprolis    To help prevent nausea and vomiting after your treatment, we encourage you to take your nausea medication as directed.  BELOW ARE SYMPTOMS THAT SHOULD BE REPORTED IMMEDIATELY: . *FEVER GREATER THAN 100.4 F (38 C) OR HIGHER . *CHILLS OR SWEATING . *NAUSEA AND VOMITING THAT IS NOT CONTROLLED WITH YOUR NAUSEA MEDICATION . *UNUSUAL SHORTNESS OF BREATH . *UNUSUAL BRUISING OR BLEEDING . *URINARY PROBLEMS (pain or burning when urinating, or frequent urination) . *BOWEL PROBLEMS (unusual diarrhea, constipation, pain near the anus) . TENDERNESS IN MOUTH AND THROAT WITH OR WITHOUT PRESENCE OF ULCERS (sore throat, sores in mouth, or a toothache) . UNUSUAL RASH, SWELLING OR PAIN  . UNUSUAL VAGINAL DISCHARGE OR ITCHING   Items with * indicate a potential emergency and should be followed up as soon as possible or go to the Emergency Department if any problems should occur.  Please show the CHEMOTHERAPY ALERT CARD or IMMUNOTHERAPY ALERT  CARD at check-in to the Emergency Department and triage nurse.  Should you have questions after your visit or need to cancel or reschedule your appointment, please contact [facility]  Dept: [phone]  and follow the prompts.  Office hours are 8:00 a.m. to 4:30 p.m. Monday - Friday. Please note that voicemails left after 4:00 p.m. may not be returned until the following business day.  We are closed weekends and major holidays. You have access to a nurse at all times for urgent questions. Please call the main number to the clinic Dept: [phone] and follow the prompts.   For any non-urgent questions, you may also contact your provider using MyChart. We now offer e-Visits for anyone 18 and older to request care online for non-urgent symptoms. For details visit [email or web address].   Also download the MyChart app! Go to the app store, search "MyChart", open the app, select [place], and log in with your MyChart username and password.  Due to Covid, a mask is required upon entering the hospital/clinic. If you do not have a mask, one will be given to you upon arrival. For doctor visits, patients may have 1 support person aged 18 or older with them. For treatment visits, patients cannot have anyone with them due to current Covid guidelines and our immunocompromised population.   

## 2022-04-28 NOTE — Progress Notes (Signed)
Per Irene Limbo MD, ok to treat today with SCR 1.73.

## 2022-05-12 ENCOUNTER — Other Ambulatory Visit: Payer: Self-pay | Admitting: Hematology

## 2022-05-12 ENCOUNTER — Other Ambulatory Visit: Payer: Self-pay

## 2022-05-12 ENCOUNTER — Inpatient Hospital Stay: Payer: BC Managed Care – PPO | Attending: Hematology

## 2022-05-12 ENCOUNTER — Inpatient Hospital Stay: Payer: BC Managed Care – PPO

## 2022-05-12 VITALS — BP 121/72 | HR 85 | Temp 98.1°F | Resp 18 | Ht 68.0 in | Wt 155.5 lb

## 2022-05-12 DIAGNOSIS — Z7189 Other specified counseling: Secondary | ICD-10-CM

## 2022-05-12 DIAGNOSIS — N189 Chronic kidney disease, unspecified: Secondary | ICD-10-CM | POA: Insufficient documentation

## 2022-05-12 DIAGNOSIS — Z5111 Encounter for antineoplastic chemotherapy: Secondary | ICD-10-CM

## 2022-05-12 DIAGNOSIS — M84551A Pathological fracture in neoplastic disease, right femur, initial encounter for fracture: Secondary | ICD-10-CM

## 2022-05-12 DIAGNOSIS — C9010 Plasma cell leukemia not having achieved remission: Secondary | ICD-10-CM | POA: Diagnosis not present

## 2022-05-12 DIAGNOSIS — Z95828 Presence of other vascular implants and grafts: Secondary | ICD-10-CM

## 2022-05-12 DIAGNOSIS — C9000 Multiple myeloma not having achieved remission: Secondary | ICD-10-CM | POA: Diagnosis not present

## 2022-05-12 LAB — CMP (CANCER CENTER ONLY)
ALT: 11 U/L (ref 0–44)
AST: 11 U/L — ABNORMAL LOW (ref 15–41)
Albumin: 4.2 g/dL (ref 3.5–5.0)
Alkaline Phosphatase: 65 U/L (ref 38–126)
Anion gap: 7 (ref 5–15)
BUN: 30 mg/dL — ABNORMAL HIGH (ref 8–23)
CO2: 25 mmol/L (ref 22–32)
Calcium: 9.3 mg/dL (ref 8.9–10.3)
Chloride: 108 mmol/L (ref 98–111)
Creatinine: 1.83 mg/dL — ABNORMAL HIGH (ref 0.61–1.24)
GFR, Estimated: 41 mL/min — ABNORMAL LOW (ref >60)
Glucose, Bld: 111 mg/dL — ABNORMAL HIGH (ref 70–99)
Potassium: 4.3 mmol/L (ref 3.5–5.1)
Sodium: 140 mmol/L (ref 135–145)
Total Bilirubin: 0.4 mg/dL (ref 0.3–1.2)
Total Protein: 6.9 g/dL (ref 6.5–8.1)

## 2022-05-12 LAB — CBC WITH DIFFERENTIAL/PLATELET
Abs Immature Granulocytes: 0.02 10*3/uL (ref 0.00–0.07)
Basophils Absolute: 0.1 10*3/uL (ref 0.0–0.1)
Basophils Relative: 1 %
Eosinophils Absolute: 0.4 10*3/uL (ref 0.0–0.5)
Eosinophils Relative: 6 %
HCT: 35.7 % — ABNORMAL LOW (ref 39.0–52.0)
Hemoglobin: 12.2 g/dL — ABNORMAL LOW (ref 13.0–17.0)
Immature Granulocytes: 0 %
Lymphocytes Relative: 26 %
Lymphs Abs: 1.9 10*3/uL (ref 0.7–4.0)
MCH: 31.7 pg (ref 26.0–34.0)
MCHC: 34.2 g/dL (ref 30.0–36.0)
MCV: 92.7 fL (ref 80.0–100.0)
Monocytes Absolute: 0.7 10*3/uL (ref 0.1–1.0)
Monocytes Relative: 9 %
Neutro Abs: 4.1 10*3/uL (ref 1.7–7.7)
Neutrophils Relative %: 58 %
Platelets: 218 10*3/uL (ref 150–400)
RBC: 3.85 MIL/uL — ABNORMAL LOW (ref 4.22–5.81)
RDW: 15.8 % — ABNORMAL HIGH (ref 11.5–15.5)
WBC: 7.2 10*3/uL (ref 4.0–10.5)
nRBC: 0 % (ref 0.0–0.2)

## 2022-05-12 MED ORDER — SODIUM CHLORIDE 0.9% FLUSH
10.0000 mL | INTRAVENOUS | Status: DC | PRN
  Administered 2022-05-12: 10 mL

## 2022-05-12 MED ORDER — ACETAMINOPHEN 325 MG PO TABS
650.0000 mg | ORAL_TABLET | Freq: Once | ORAL | Status: AC
  Administered 2022-05-12: 650 mg via ORAL
  Filled 2022-05-12: qty 2

## 2022-05-12 MED ORDER — SODIUM CHLORIDE 0.9 % IV SOLN
20.0000 mg | Freq: Once | INTRAVENOUS | Status: AC
  Administered 2022-05-12: 20 mg via INTRAVENOUS
  Filled 2022-05-12: qty 20

## 2022-05-12 MED ORDER — SODIUM CHLORIDE 0.9 % IV SOLN: Freq: Once | INTRAVENOUS | Status: AC

## 2022-05-12 MED ORDER — SODIUM CHLORIDE 0.9 % IV SOLN: INTRAVENOUS | Status: AC

## 2022-05-12 MED ORDER — FAMOTIDINE 20 MG PO TABS
20.0000 mg | ORAL_TABLET | Freq: Once | ORAL | Status: AC
  Administered 2022-05-12: 20 mg via ORAL
  Filled 2022-05-12: qty 1

## 2022-05-12 MED ORDER — DEXTROSE 5 % IV SOLN
56.0000 mg/m2 | Freq: Once | INTRAVENOUS | Status: AC
  Administered 2022-05-12: 100 mg via INTRAVENOUS
  Filled 2022-05-12: qty 30

## 2022-05-12 MED ORDER — SODIUM CHLORIDE 0.9% FLUSH
10.0000 mL | Freq: Once | INTRAVENOUS | Status: AC
  Administered 2022-05-12: 10 mL

## 2022-05-12 MED ORDER — HEPARIN SOD (PORK) LOCK FLUSH 100 UNIT/ML IV SOLN
500.0000 [IU] | Freq: Once | INTRAVENOUS | Status: AC | PRN
  Administered 2022-05-12: 500 [IU]

## 2022-05-12 MED ORDER — SODIUM CHLORIDE 0.9 % IV SOLN
60.0000 mg | Freq: Once | INTRAVENOUS | Status: AC
  Administered 2022-05-12: 60 mg via INTRAVENOUS
  Filled 2022-05-12: qty 10

## 2022-05-12 NOTE — Patient Instructions (Signed)
[facility]   Discharge Instructions: Thank you for choosing [facility] to provide your oncology and hematology care.   If you have a lab appointment with the Cancer Center, please go directly to the Cancer Center and check in at the registration area.   Wear comfortable clothing and clothing appropriate for easy access to any Portacath or PICC line.   We strive to give you quality time with your provider. You may need to reschedule your appointment if you arrive late (15 or more minutes).  Arriving late affects you and other patients whose appointments are after yours.  Also, if you miss three or more appointments without notifying the office, you may be dismissed from the clinic at the provider's discretion.      For prescription refill requests, have your pharmacy contact our office and allow 72 hours for refills to be completed.    Today you received the following chemotherapy and/or immunotherapy agents: Carfilzomib (Kyprolis)     To help prevent nausea and vomiting after your treatment, we encourage you to take your nausea medication as directed.  BELOW ARE SYMPTOMS THAT SHOULD BE REPORTED IMMEDIATELY: *FEVER GREATER THAN 100.4 F (38 C) OR HIGHER *CHILLS OR SWEATING *NAUSEA AND VOMITING THAT IS NOT CONTROLLED WITH YOUR NAUSEA MEDICATION *UNUSUAL SHORTNESS OF BREATH *UNUSUAL BRUISING OR BLEEDING *URINARY PROBLEMS (pain or burning when urinating, or frequent urination) *BOWEL PROBLEMS (unusual diarrhea, constipation, pain near the anus) TENDERNESS IN MOUTH AND THROAT WITH OR WITHOUT PRESENCE OF ULCERS (sore throat, sores in mouth, or a toothache) UNUSUAL RASH, SWELLING OR PAIN  UNUSUAL VAGINAL DISCHARGE OR ITCHING   Items with * indicate a potential emergency and should be followed up as soon as possible or go to the Emergency Department if any problems should occur.  Please show the CHEMOTHERAPY ALERT CARD or IMMUNOTHERAPY ALERT CARD  at check-in to the Emergency Department and triage nurse.  Should you have questions after your visit or need to cancel or reschedule your appointment, please contact [facility]  Dept: [phone]  and follow the prompts.  Office hours are 8:00 a.m. to 4:30 p.m. Monday - Friday. Please note that voicemails left after 4:00 p.m. may not be returned until the following business day.  We are closed weekends and major holidays. You have access to a nurse at all times for urgent questions. Please call the main number to the clinic Dept: [phone] and follow the prompts.   For any non-urgent questions, you may also contact your provider using MyChart. We now offer e-Visits for anyone 18 and older to request care online for non-urgent symptoms. For details visit [email or web address].   Also download the MyChart app! Go to the app store, search "MyChart", open the app, select [place], and log in with your MyChart username and password.  Due to Covid, a mask is required upon entering the hospital/clinic. If you do not have a mask, one will be given to you upon arrival. For doctor visits, patients may have 1 support person aged 18 or older with them. For treatment visits, patients cannot have anyone with them due to current Covid guidelines and our immunocompromised population.  

## 2022-05-12 NOTE — Progress Notes (Signed)
Per Dr. Irene Limbo - okay to treat with elevated creatinine of 1.83.

## 2022-05-17 ENCOUNTER — Encounter: Payer: Self-pay | Admitting: Hematology

## 2022-05-19 ENCOUNTER — Other Ambulatory Visit: Payer: Self-pay

## 2022-05-19 DIAGNOSIS — C9010 Plasma cell leukemia not having achieved remission: Secondary | ICD-10-CM

## 2022-05-19 MED ORDER — LENALIDOMIDE 10 MG PO CAPS: 10.0000 mg | ORAL_CAPSULE | Freq: Every day | ORAL | 0 refills | Status: DC

## 2022-05-25 ENCOUNTER — Encounter: Payer: Self-pay | Admitting: Hematology

## 2022-05-26 ENCOUNTER — Inpatient Hospital Stay: Payer: BC Managed Care – PPO

## 2022-05-26 ENCOUNTER — Inpatient Hospital Stay: Payer: BC Managed Care – PPO | Admitting: Hematology

## 2022-05-26 MED FILL — Dexamethasone Sodium Phosphate Inj 100 MG/10ML: INTRAMUSCULAR | Qty: 2 | Status: AC

## 2022-05-29 ENCOUNTER — Inpatient Hospital Stay: Payer: BC Managed Care – PPO

## 2022-05-29 ENCOUNTER — Inpatient Hospital Stay (HOSPITAL_BASED_OUTPATIENT_CLINIC_OR_DEPARTMENT_OTHER): Payer: BC Managed Care – PPO | Admitting: Hematology

## 2022-05-29 ENCOUNTER — Other Ambulatory Visit: Payer: Self-pay

## 2022-05-29 VITALS — BP 135/80 | HR 84 | Temp 97.1°F | Resp 20 | Wt 157.7 lb

## 2022-05-29 DIAGNOSIS — Z95828 Presence of other vascular implants and grafts: Secondary | ICD-10-CM

## 2022-05-29 DIAGNOSIS — C9010 Plasma cell leukemia not having achieved remission: Secondary | ICD-10-CM

## 2022-05-29 DIAGNOSIS — Z7189 Other specified counseling: Secondary | ICD-10-CM

## 2022-05-29 DIAGNOSIS — M84551A Pathological fracture in neoplastic disease, right femur, initial encounter for fracture: Secondary | ICD-10-CM

## 2022-05-29 DIAGNOSIS — Z5111 Encounter for antineoplastic chemotherapy: Secondary | ICD-10-CM | POA: Diagnosis not present

## 2022-05-29 LAB — CBC WITH DIFFERENTIAL/PLATELET
Abs Immature Granulocytes: 0.02 10*3/uL (ref 0.00–0.07)
Basophils Absolute: 0.1 10*3/uL (ref 0.0–0.1)
Basophils Relative: 3 %
Eosinophils Absolute: 0.1 10*3/uL (ref 0.0–0.5)
Eosinophils Relative: 1 %
HCT: 36 % — ABNORMAL LOW (ref 39.0–52.0)
Hemoglobin: 12.5 g/dL — ABNORMAL LOW (ref 13.0–17.0)
Immature Granulocytes: 0 %
Lymphocytes Relative: 24 %
Lymphs Abs: 1.3 10*3/uL (ref 0.7–4.0)
MCH: 32.6 pg (ref 26.0–34.0)
MCHC: 34.7 g/dL (ref 30.0–36.0)
MCV: 93.8 fL (ref 80.0–100.0)
Monocytes Absolute: 0.6 10*3/uL (ref 0.1–1.0)
Monocytes Relative: 11 %
Neutro Abs: 3.4 10*3/uL (ref 1.7–7.7)
Neutrophils Relative %: 61 %
Platelets: 209 10*3/uL (ref 150–400)
RBC: 3.84 MIL/uL — ABNORMAL LOW (ref 4.22–5.81)
RDW: 15.5 % (ref 11.5–15.5)
WBC: 5.6 10*3/uL (ref 4.0–10.5)
nRBC: 0 % (ref 0.0–0.2)

## 2022-05-29 LAB — CMP (CANCER CENTER ONLY)
ALT: 11 U/L (ref 0–44)
AST: 11 U/L — ABNORMAL LOW (ref 15–41)
Albumin: 4 g/dL (ref 3.5–5.0)
Alkaline Phosphatase: 61 U/L (ref 38–126)
Anion gap: 8 (ref 5–15)
BUN: 23 mg/dL (ref 8–23)
CO2: 25 mmol/L (ref 22–32)
Calcium: 9.6 mg/dL (ref 8.9–10.3)
Chloride: 109 mmol/L (ref 98–111)
Creatinine: 1.75 mg/dL — ABNORMAL HIGH (ref 0.61–1.24)
GFR, Estimated: 43 mL/min — ABNORMAL LOW (ref >60)
Glucose, Bld: 122 mg/dL — ABNORMAL HIGH (ref 70–99)
Potassium: 3.8 mmol/L (ref 3.5–5.1)
Sodium: 142 mmol/L (ref 135–145)
Total Bilirubin: 0.5 mg/dL (ref 0.3–1.2)
Total Protein: 6.8 g/dL (ref 6.5–8.1)

## 2022-05-29 MED ORDER — DEXTROSE 5 % IV SOLN
56.0000 mg/m2 | Freq: Once | INTRAVENOUS | Status: AC
  Administered 2022-05-29: 100 mg via INTRAVENOUS
  Filled 2022-05-29: qty 30

## 2022-05-29 MED ORDER — SODIUM CHLORIDE 0.9 % IV SOLN: Freq: Once | INTRAVENOUS | Status: AC

## 2022-05-29 MED ORDER — HEPARIN SOD (PORK) LOCK FLUSH 100 UNIT/ML IV SOLN: 500.0000 [IU] | Freq: Once | INTRAVENOUS | Status: DC

## 2022-05-29 MED ORDER — HEPARIN SOD (PORK) LOCK FLUSH 100 UNIT/ML IV SOLN
500.0000 [IU] | Freq: Once | INTRAVENOUS | Status: AC | PRN
  Administered 2022-05-29: 500 [IU]

## 2022-05-29 MED ORDER — ACETAMINOPHEN 325 MG PO TABS
650.0000 mg | ORAL_TABLET | Freq: Once | ORAL | Status: AC
  Administered 2022-05-29: 650 mg via ORAL
  Filled 2022-05-29: qty 2

## 2022-05-29 MED ORDER — SODIUM CHLORIDE 0.9% FLUSH
10.0000 mL | Freq: Once | INTRAVENOUS | Status: AC
  Administered 2022-05-29: 10 mL

## 2022-05-29 MED ORDER — SODIUM CHLORIDE 0.9% FLUSH
10.0000 mL | INTRAVENOUS | Status: DC | PRN
  Administered 2022-05-29: 10 mL

## 2022-05-29 MED ORDER — SODIUM CHLORIDE 0.9 % IV SOLN
20.0000 mg | Freq: Once | INTRAVENOUS | Status: AC
  Administered 2022-05-29: 20 mg via INTRAVENOUS
  Filled 2022-05-29: qty 20

## 2022-05-29 MED ORDER — FAMOTIDINE 20 MG PO TABS
20.0000 mg | ORAL_TABLET | Freq: Once | ORAL | Status: AC
  Administered 2022-05-29: 20 mg via ORAL
  Filled 2022-05-29: qty 1

## 2022-05-29 NOTE — Progress Notes (Signed)
Per Dr. Irene Limbo - okay to treat with elevated creatinine 1.75.

## 2022-05-29 NOTE — Patient Instructions (Signed)
[facility]   Discharge Instructions: Thank you for choosing [facility] to provide your oncology and hematology care.   If you have a lab appointment with the Cancer Center, please go directly to the Cancer Center and check in at the registration area.   Wear comfortable clothing and clothing appropriate for easy access to any Portacath or PICC line.   We strive to give you quality time with your provider. You may need to reschedule your appointment if you arrive late (15 or more minutes).  Arriving late affects you and other patients whose appointments are after yours.  Also, if you miss three or more appointments without notifying the office, you may be dismissed from the clinic at the provider's discretion.      For prescription refill requests, have your pharmacy contact our office and allow 72 hours for refills to be completed.    Today you received the following chemotherapy and/or immunotherapy agents: Carfilzomib (Kyprolis)      To help prevent nausea and vomiting after your treatment, we encourage you to take your nausea medication as directed.  BELOW ARE SYMPTOMS THAT SHOULD BE REPORTED IMMEDIATELY: *FEVER GREATER THAN 100.4 F (38 C) OR HIGHER *CHILLS OR SWEATING *NAUSEA AND VOMITING THAT IS NOT CONTROLLED WITH YOUR NAUSEA MEDICATION *UNUSUAL SHORTNESS OF BREATH *UNUSUAL BRUISING OR BLEEDING *URINARY PROBLEMS (pain or burning when urinating, or frequent urination) *BOWEL PROBLEMS (unusual diarrhea, constipation, pain near the anus) TENDERNESS IN MOUTH AND THROAT WITH OR WITHOUT PRESENCE OF ULCERS (sore throat, sores in mouth, or a toothache) UNUSUAL RASH, SWELLING OR PAIN  UNUSUAL VAGINAL DISCHARGE OR ITCHING   Items with * indicate a potential emergency and should be followed up as soon as possible or go to the Emergency Department if any problems should occur.  Please show the CHEMOTHERAPY ALERT CARD or IMMUNOTHERAPY ALERT CARD  at check-in to the Emergency Department and triage nurse.  Should you have questions after your visit or need to cancel or reschedule your appointment, please contact [facility]  Dept: [phone]  and follow the prompts.  Office hours are 8:00 a.m. to 4:30 p.m. Monday - Friday. Please note that voicemails left after 4:00 p.m. may not be returned until the following business day.  We are closed weekends and major holidays. You have access to a nurse at all times for urgent questions. Please call the main number to the clinic Dept: [phone] and follow the prompts.   For any non-urgent questions, you may also contact your provider using MyChart. We now offer e-Visits for anyone 18 and older to request care online for non-urgent symptoms. For details visit [email or web address].   Also download the MyChart app! Go to the app store, search "MyChart", open the app, select [place], and log in with your MyChart username and password.  Masks are optional in the cancer centers. If you would like for your care team to wear a mask while they are taking care of you, please let them know. For doctor visits, patients may have with them one support person who is at least 65 years old. At this time, visitors are not allowed in the infusion area. 

## 2022-05-29 NOTE — Progress Notes (Incomplete)
HEMATOLOGY/ONCOLOGY CLINIC NOTE  Date of Service: 05/29/2022  CC Follow-up for continued evaluation and management of plasma cell leukemia/multiple myeloma  HISTORY OF PRESENTING ILLNESS:  Please see previous note for details of initial presentation.  INTERVAL HISTORY:  Andres Fernandez is here for continued evaluation and management of his plasma cell leukemia/multiple myeloma. He reports He is doing well with no new symptoms or concerns.  He notes that he enjoyed his recent trip to [place] to visit [employer].  He is up to date on all of his COVID boosters.  No new skin rashes. No concerns with shingles. No new dental issues. No infection issues.   No fevers no chills no night sweats.   No other new focal bone pains. No other new or acute focal symptoms.  He reports no significant new toxicities from his current aggressive maintenance regimen with carfilzomib and Revlimid.  Labs done today were reviewed in detail with him.  REVIEW OF SYSTEMS:   10 Point review of Systems was done is negative except as noted above.  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   Tobacco abuse     SURGICAL HISTORY: Past Surgical History:  Procedure Laterality Date   APPENDECTOMY     FEMUR IM NAIL Right 11/20/2017   Procedure: RIGHT FEMORAL INTRAMEDULLARY (IM) NAIL;  Surgeon: Nicholes Stairs, MD;  Location: [facility];  Service: Orthopedics;  Laterality: Right;   IR IMAGING GUIDED PORT INSERTION  08/19/2021    SOCIAL HISTORY: Social History   Socioeconomic History   Marital status: Single    Spouse name: Not on file   Number of children: Not on file   Years of education: Not on file   Highest education level: Not on file  Occupational History   Not on file  Tobacco Use   Smoking status: Every Day    Packs/day: 0.25    Types: Cigarettes     Last attempt to quit: 11/09/2017    Years since quitting: 4.5   Smokeless tobacco: Never   Tobacco comments:    1 cigarette/day as of 09/25/18  Vaping Use   Vaping Use: Never used  Substance and Sexual Activity   Alcohol use: Yes   Drug use: No   Sexual activity: Not on file  Other Topics Concern   Not on file  Social History Narrative   Not on file   Social Determinants of Health   Financial Resource Strain: Not on file  Food Insecurity: Not on file  Transportation Needs: Not on file  Physical Activity: Not on file  Stress: Not on file  Social Connections: Not on file  Intimate Partner Violence: Not on file    FAMILY HISTORY: Family History  Problem Relation Age of Onset   COPD Mother    Liver cancer Mother     ALLERGIES:  has No Known Allergies.  MEDICATIONS:  . Current Outpatient Medications on File Prior to Visit  Medication Sig Dispense Refill   acyclovir (ZOVIRAX) 400 MG tablet Take 1 tablet (400 mg total) by mouth daily. 30 tablet 11   Albuterol Sulfate (PROAIR RESPICLICK) 324 (90 Base) MCG/ACT AEPB Inhale into the lungs.     amoxicillin (AMOXIL) 500 MG capsule Take 500 mg by mouth 3 (three) times daily.     aspirin EC 81 MG tablet Take 1 tablet (81  mg total) by mouth daily. (Patient taking differently: Take 81 mg by mouth at bedtime.) 60 tablet 11   cholecalciferol (VITAMIN D) 400 units TABS tablet Take 400 Units by mouth at bedtime.      FLOVENT DISKUS 250 MCG/ACT AEPB Inhale into the lungs.     ipratropium-albuterol (DUONEB) 0.5-2.5 (3) MG/3ML SOLN Inhale into the lungs.     lenalidomide (REVLIMID) 10 MG capsule Take 1 capsule (10 mg total) by mouth daily. Take for 21 days on, 7 days off, repeat every 28 days. 21 capsule 0   lidocaine-prilocaine (EMLA) cream Apply 1 application topically as needed. Apply to Port-A-Cath at least 1 hr prior to treatment. 30 g 0   Multiple Vitamins-Minerals (MULTIVITAMIN WITH MINERALS) tablet Take 1 tablet by mouth at  bedtime.      nicotine (NICODERM CQ - DOSED IN MG/24 HOURS) 14 mg/24hr patch PLACE 1 PATCH (14 MG TOTAL) ONTO THE SKIN DAILY. 28 patch 1   ondansetron (ZOFRAN) 4 MG tablet Take 2 tablets (8 mg total) by mouth every 8 (eight) hours as needed for nausea. 30 tablet 0   Vitamin D, Ergocalciferol, (DRISDOL) 1.25 MG (50000 UNIT) CAPS capsule Take 1 capsule (50,000 Units total) by mouth once a week. 12 capsule 6   Current Facility-Administered Medications on File Prior to Visit  Medication Dose Route Frequency Provider Last Rate Last Admin   0.9 %  sodium chloride infusion   Intravenous Once Brunetta Genera, MD       sodium chloride flush (NS) 0.9 % injection 10 mL  10 mL Intracatheter PRN Brunetta Genera, MD   10 mL at 09/16/21 1623    PHYSICAL EXAMINATION:  ECOG FS:1 - Symptomatic but completely ambulatory Vitals:   05/29/22 0955  BP: 135/80  Pulse: 84  Resp: 20  Temp: (!) 97.1 F (36.2 C)  SpO2: 100%   Wt Readings from Last 3 Encounters:  05/12/22 155 lb 8 oz (70.5 kg)  04/28/22 156 lb 1.9 oz (70.8 kg)  04/14/22 158 lb (71.7 kg)   NAD GENERAL:alert, in no acute distress and comfortable SKIN: no acute rashes, no significant lesions EYES: conjunctiva are pink and non-injected, sclera anicteric NECK: supple, no JVD LYMPH:  no palpable lymphadenopathy in the cervical, axillary or inguinal regions LUNGS: clear to auscultation b/l with normal respiratory effort HEART: regular rate & rhythm ABDOMEN:  normoactive bowel sounds , non tender, not distended. Extremity: no pedal edema PSYCH: alert & oriented x 3 with fluent speech NEURO: no focal motor/sensory deficits  LABORATORY DATA:  I have reviewed the data as listed     Latest Ref Rng & Units 05/12/2022    9:13 AM 04/28/2022    8:55 AM 04/14/2022    9:07 AM  CBC  WBC 4.0 - 10.5 K/uL 7.2  7.1  6.4   Hemoglobin 13.0 - 17.0 g/dL 12.2  11.8  11.6   Hematocrit 39.0 - 52.0 % 35.7  34.1  34.5   Platelets 150 - 400 K/uL 218  243   232    CBC    Component Value Date/Time   WBC 7.2 05/12/2022 0913   RBC 3.85 (L) 05/12/2022 0913   HGB 12.2 (L) 05/12/2022 0913   HGB 11.9 (L) 07/08/2021 1009   HGB 8.3 (L) 12/14/2017 1230   HCT 35.7 (L) 05/12/2022 0913   HCT 23.9 (L) 12/14/2017 1230   PLT 218 05/12/2022 0913   PLT 211 07/08/2021 1009   PLT 524 (H) 12/14/2017 1230  MCV 92.7 05/12/2022 0913   MCV 88.4 12/14/2017 1230   MCH 31.7 05/12/2022 0913   MCHC 34.2 05/12/2022 0913   RDW 15.8 (H) 05/12/2022 0913   RDW 15.3 (H) 12/14/2017 1230   LYMPHSABS 1.9 05/12/2022 0913   LYMPHSABS 1.4 12/14/2017 1230   MONOABS 0.7 05/12/2022 0913   MONOABS 0.6 12/14/2017 1230   EOSABS 0.4 05/12/2022 0913   EOSABS 0.1 12/14/2017 1230   BASOSABS 0.1 05/12/2022 0913   BASOSABS 0.1 12/14/2017 1230        Latest Ref Rng & Units 05/12/2022    9:13 AM 04/28/2022    8:55 AM 04/14/2022    9:07 AM  CMP  Glucose 70 - 99 mg/dL 111  118  98   BUN 8 - 23 mg/dL 30  32  22   Creatinine 0.61 - 1.24 mg/dL 1.83  1.73  1.64   Sodium 135 - 145 mmol/L 140  140  140   Potassium 3.5 - 5.1 mmol/L 4.3  4.4  4.4   Chloride 98 - 111 mmol/L 108  110  108   CO2 22 - 32 mmol/L 25  24  26    Calcium 8.9 - 10.3 mg/dL 9.3  8.6  9.2   Total Protein 6.5 - 8.1 g/dL 6.9  7.0  7.3   Total Bilirubin 0.3 - 1.2 mg/dL 0.4  0.4  0.4   Alkaline Phos 38 - 126 U/L 65  66  67   AST 15 - 41 U/L 11  16  13    ALT 0 - 44 U/L 11  13  12        03/09/20 ([id number]) Bone Marrow Biopsy at [facility]   07/02/2019 [facility] ([id number]) [facility]    08/07/18 BM Bx:        RADIOGRAPHIC STUDIES: I have personally reviewed the radiological images as listed and agreed with the findings in the report. No results found.  ASSESSMENT & PLAN:    66 y.o. 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   Initial BM Bx showed >95% involvement with kappa restricted serum free light chains. 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 MoL Cy-   08/07/18 BM Bx revealed normocellular bone marrow with trilineage hematopoiesis and 1% plasma cells which indicates the pt is in remission 08/06/18 PET/CT revealed  Expansile lytic lesion involving the left iliac bone, unchanged. Lytic lesion involving the sternum, unchanged. While both are suspicious for myeloma, neither lesion is FDG avid. No hypermetabolic osseous lesions in the visualized axial and appendicular skeleton. Please note that the bilateral lower extremities were not imaged. No suspicious lymphadenopathy. Spleen is normal in size.    Pt began Ninlaro 16m maintenance after pt finished 13 cycles of CyBorD and BM bx showed only 1% plasma cells, then resumed CyBorD and held Ninlaro in anticipation of his tandem bone marrow transplants beginning in late December 2019 [facility]  11/19/18 High dose chemotherapy with Melphalan 1442mm2 and autologous stem cell rescue  03/20/19 High dose chemotherapy with Melphalan 20067m2 and autologous stem cell rescue. Non-infectious diarrhea, uncomplicated course otherwise.  07/02/2019 PET scan with results revealing "1. No FDG avid bone lesions are identified. 2.  Asymmetric uptake within the right, greater than left, palatine tonsils with mild fullness of the right tonsillar soft tissue. ENT consult vs attention on follow up suggested. 3.  Innumerable mixed sclerotic and lytic lesions throughout the axial and appendicular skeleton without associated hypermetabolic activity. 4.  Ancillary CT findings as above."  07/02/2019 bone marrow biopsy ([id number])with results showing: "Trilineage hematopoieses with maturation. No plasma cell myeloma identified.  Mild anemia and No Rouleaux or circulating plasma cells identified in the peripheral blood."  2) s/p Prophylactic IM nailing for rt femur  completed Physical Therapy at the [facility].  -patient previously reported improvement and is currently progressively back to full time work.  3) Thrombocytopenia resolved   4) Renal insufficiency --likely related to multiple myeloma.  Remained fairly stable with baseline creatinine of 1.5-2.1.  Creatinine is 1.6 today Plan -maintain follow up with nephrology referral to Dr Marval [facility]al [facility] for continued treatment -Pt completed 8 weeks of maverick for hep c with Dr Linus Salmons on 07/08/18.  -Kidney numbers improved. Creatinine in the 1.7-2.2 range   5) h/o tobacco abuse-counseled on smoking cessation -Currently not smoking.   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 Hemoglobin is stable at 11.6  PLAN: -Labs done today were reviewed in detail with the patient. CBC is stable with a hemoglobin of 12.5 with a WBC count of 5.6k and platelets of 209k CMP stable with creatinine of 1.75 MM panel and KL light chain done 04/14/2022: Myeloma panel shows no observation of M protein spike. Slightly reduced IgM at 16. serum free light chain labs shows elevated KFLC of 36.1 and KL light chain ratio of 1.89. Patient reports no significant new toxicities with current dose Revlimid at this time. No new infection issues since his last clinic visit. -We will continue his carfilzomib at 56 mg per metered square every 2 weeks. -Continue maintenance Revlimid at 10 mg p.o. daily 3 weeks on 1 week off. -Continue aspirin 81 mg p.o. daily -Continue ergocalciferol 50,000 units once weekly -Continue multivitamin -Continue Aredia every 8 weeks for maintenance.  FOLLOW UP: ***  The total time spent in the appointment was *** minutes*.  All of the patient's questions were  answered with apparent satisfaction. The patient knows to call the clinic with any problems, questions or concerns.   Sullivan Lone MD MS AAHIVMS [facility] Hematology/Oncology Physician [facility]  .*Total Encounter Time as defined by the Centers for Medicare and Medicaid Services includes, in addition to the face-to-face time of a patient visit (documented in the note above) non-face-to-face time: obtaining and reviewing outside history, ordering and reviewing medications, tests or procedures, care coordination (communications with other health care professionals or caregivers) and documentation in the medical record.  I, Melene Muller, am acting as scribe for Dr. Sullivan Lone, MD.

## 2022-06-05 ENCOUNTER — Encounter: Payer: Self-pay | Admitting: Hematology

## 2022-06-08 MED FILL — Dexamethasone Sodium Phosphate Inj 100 MG/10ML: INTRAMUSCULAR | Qty: 2 | Status: AC

## 2022-06-09 ENCOUNTER — Inpatient Hospital Stay: Payer: BC Managed Care – PPO

## 2022-06-09 ENCOUNTER — Other Ambulatory Visit: Payer: Self-pay

## 2022-06-09 VITALS — BP 135/75 | HR 77 | Temp 98.1°F | Resp 19 | Ht 68.0 in | Wt 158.8 lb

## 2022-06-09 DIAGNOSIS — Z5111 Encounter for antineoplastic chemotherapy: Secondary | ICD-10-CM | POA: Diagnosis not present

## 2022-06-09 DIAGNOSIS — Z7189 Other specified counseling: Secondary | ICD-10-CM

## 2022-06-09 DIAGNOSIS — Z95828 Presence of other vascular implants and grafts: Secondary | ICD-10-CM

## 2022-06-09 DIAGNOSIS — C9010 Plasma cell leukemia not having achieved remission: Secondary | ICD-10-CM

## 2022-06-09 DIAGNOSIS — M84551A Pathological fracture in neoplastic disease, right femur, initial encounter for fracture: Secondary | ICD-10-CM

## 2022-06-09 LAB — CMP (CANCER CENTER ONLY)
ALT: 9 U/L (ref 0–44)
AST: 10 U/L — ABNORMAL LOW (ref 15–41)
Albumin: 4 g/dL (ref 3.5–5.0)
Alkaline Phosphatase: 56 U/L (ref 38–126)
Anion gap: 5 (ref 5–15)
BUN: 26 mg/dL — ABNORMAL HIGH (ref 8–23)
CO2: 24 mmol/L (ref 22–32)
Calcium: 8.7 mg/dL — ABNORMAL LOW (ref 8.9–10.3)
Chloride: 110 mmol/L (ref 98–111)
Creatinine: 1.6 mg/dL — ABNORMAL HIGH (ref 0.61–1.24)
GFR, Estimated: 48 mL/min — ABNORMAL LOW (ref >60)
Glucose, Bld: 99 mg/dL (ref 70–99)
Potassium: 4.1 mmol/L (ref 3.5–5.1)
Sodium: 139 mmol/L (ref 135–145)
Total Bilirubin: 0.3 mg/dL (ref 0.3–1.2)
Total Protein: 6.6 g/dL (ref 6.5–8.1)

## 2022-06-09 LAB — CBC WITH DIFFERENTIAL/PLATELET
Abs Immature Granulocytes: 0.02 10*3/uL (ref 0.00–0.07)
Basophils Absolute: 0.1 10*3/uL (ref 0.0–0.1)
Basophils Relative: 1 %
Eosinophils Absolute: 0.3 10*3/uL (ref 0.0–0.5)
Eosinophils Relative: 4 %
HCT: 33.6 % — ABNORMAL LOW (ref 39.0–52.0)
Hemoglobin: 11.7 g/dL — ABNORMAL LOW (ref 13.0–17.0)
Immature Granulocytes: 0 %
Lymphocytes Relative: 25 %
Lymphs Abs: 1.7 10*3/uL (ref 0.7–4.0)
MCH: 32.8 pg (ref 26.0–34.0)
MCHC: 34.8 g/dL (ref 30.0–36.0)
MCV: 94.1 fL (ref 80.0–100.0)
Monocytes Absolute: 0.9 10*3/uL (ref 0.1–1.0)
Monocytes Relative: 13 %
Neutro Abs: 3.9 10*3/uL (ref 1.7–7.7)
Neutrophils Relative %: 57 %
Platelets: 166 10*3/uL (ref 150–400)
RBC: 3.57 MIL/uL — ABNORMAL LOW (ref 4.22–5.81)
RDW: 15.3 % (ref 11.5–15.5)
WBC: 6.8 10*3/uL (ref 4.0–10.5)
nRBC: 0 % (ref 0.0–0.2)

## 2022-06-09 MED ORDER — FAMOTIDINE 20 MG PO TABS
20.0000 mg | ORAL_TABLET | Freq: Once | ORAL | Status: AC
  Administered 2022-06-09: 20 mg via ORAL
  Filled 2022-06-09: qty 1

## 2022-06-09 MED ORDER — SODIUM CHLORIDE 0.9 % IV SOLN: INTRAVENOUS | Status: AC

## 2022-06-09 MED ORDER — SODIUM CHLORIDE 0.9 % IV SOLN
20.0000 mg | Freq: Once | INTRAVENOUS | Status: AC
  Administered 2022-06-09: 20 mg via INTRAVENOUS
  Filled 2022-06-09: qty 20

## 2022-06-09 MED ORDER — SODIUM CHLORIDE 0.9 % IV SOLN: Freq: Once | INTRAVENOUS | Status: AC

## 2022-06-09 MED ORDER — SODIUM CHLORIDE 0.9% FLUSH
10.0000 mL | INTRAVENOUS | Status: DC | PRN
  Administered 2022-06-09: 10 mL

## 2022-06-09 MED ORDER — HEPARIN SOD (PORK) LOCK FLUSH 100 UNIT/ML IV SOLN
500.0000 [IU] | Freq: Once | INTRAVENOUS | Status: AC | PRN
  Administered 2022-06-09: 500 [IU]

## 2022-06-09 MED ORDER — SODIUM CHLORIDE 0.9% FLUSH
10.0000 mL | Freq: Once | INTRAVENOUS | Status: AC
  Administered 2022-06-09: 10 mL

## 2022-06-09 MED ORDER — DEXTROSE 5 % IV SOLN
56.0000 mg/m2 | Freq: Once | INTRAVENOUS | Status: AC
  Administered 2022-06-09: 100 mg via INTRAVENOUS
  Filled 2022-06-09: qty 30

## 2022-06-09 MED ORDER — ACETAMINOPHEN 325 MG PO TABS
650.0000 mg | ORAL_TABLET | Freq: Once | ORAL | Status: AC
  Administered 2022-06-09: 650 mg via ORAL
  Filled 2022-06-09: qty 2

## 2022-06-09 NOTE — Patient Instructions (Signed)
[facility] ONCOLOGY  Discharge Instructions: Thank you for choosing [facility] to provide your oncology and hematology care.   If you have a lab appointment with the [facility], please go directly to the [facility] and check in at the registration area.   Wear comfortable clothing and clothing appropriate for easy access to any Portacath or PICC line.   We strive to give you quality time with your provider. You may need to reschedule your appointment if you arrive late (15 or more minutes).  Arriving late affects you and other patients whose appointments are after yours.  Also, if you miss three or more appointments without notifying the office, you may be dismissed from the clinic at the provider's discretion.      For prescription refill requests, have your pharmacy contact our office and allow 72 hours for refills to be completed.    Today you received the following chemotherapy and/or immunotherapy agents Kyprolis      To help prevent nausea and vomiting after your treatment, we encourage you to take your nausea medication as directed.  BELOW ARE SYMPTOMS THAT SHOULD BE REPORTED IMMEDIATELY: *FEVER GREATER THAN 100.4 F (38 C) OR HIGHER *CHILLS OR SWEATING *NAUSEA AND VOMITING THAT IS NOT CONTROLLED WITH YOUR NAUSEA MEDICATION *UNUSUAL SHORTNESS OF BREATH *UNUSUAL BRUISING OR BLEEDING *URINARY PROBLEMS (pain or burning when urinating, or frequent urination) *BOWEL PROBLEMS (unusual diarrhea, constipation, pain near the anus) TENDERNESS IN MOUTH AND THROAT WITH OR WITHOUT PRESENCE OF ULCERS (sore throat, sores in mouth, or a toothache) UNUSUAL RASH, SWELLING OR PAIN  UNUSUAL VAGINAL DISCHARGE OR ITCHING   Items with * indicate a potential emergency and should be followed up as soon as possible or go to the Emergency Department if any problems should occur.  Please show the CHEMOTHERAPY ALERT CARD or IMMUNOTHERAPY ALERT CARD at check-in to  the Emergency Department and triage nurse.  Should you have questions after your visit or need to cancel or reschedule your appointment, please contact [facility]  Dept: [phone]  and follow the prompts.  Office hours are 8:00 a.m. to 4:30 p.m. Monday - Friday. Please note that voicemails left after 4:00 p.m. may not be returned until the following business day.  We are closed weekends and major holidays. You have access to a nurse at all times for urgent questions. Please call the main number to the clinic Dept: [phone] and follow the prompts.   For any non-urgent questions, you may also contact your provider using MyChart. We now offer e-Visits for anyone 21 and older to request care online for non-urgent symptoms. For details visit [email or web address].   Also download the MyChart app! Go to the app store, search "MyChart", open the app, select [facility], and log in with your MyChart username and password.  Masks are optional in the cancer centers. If you would like for your care team to wear a mask while they are taking care of you, please let them know. For doctor visits, patients may have with them one support person who is at least 65 years old. At this time, visitors are not allowed in the infusion area.

## 2022-06-09 NOTE — Progress Notes (Signed)
Okay to treat with creat 1.6 per Dr. Irene Limbo.

## 2022-06-12 LAB — KAPPA/LAMBDA LIGHT CHAINS
Kappa free light chain: 26.2 mg/L — ABNORMAL HIGH (ref 3.3–19.4)
Kappa, lambda light chain ratio: 1.35 (ref 0.26–1.65)
Lambda free light chains: 19.4 mg/L (ref 5.7–26.3)

## 2022-06-15 ENCOUNTER — Other Ambulatory Visit: Payer: Self-pay

## 2022-06-15 DIAGNOSIS — C9010 Plasma cell leukemia not having achieved remission: Secondary | ICD-10-CM

## 2022-06-15 MED ORDER — LENALIDOMIDE 10 MG PO CAPS: 10.0000 mg | ORAL_CAPSULE | Freq: Every day | ORAL | 0 refills | Status: DC

## 2022-06-16 LAB — MULTIPLE MYELOMA PANEL, SERUM
Albumin SerPl Elph-Mcnc: 3.5 g/dL (ref 2.9–4.4)
Albumin/Glob SerPl: 1.3 (ref 0.7–1.7)
Alpha 1: 0.2 g/dL (ref 0.0–0.4)
Alpha2 Glob SerPl Elph-Mcnc: 1 g/dL (ref 0.4–1.0)
B-Globulin SerPl Elph-Mcnc: 0.9 g/dL (ref 0.7–1.3)
Gamma Glob SerPl Elph-Mcnc: 0.6 g/dL (ref 0.4–1.8)
Globulin, Total: 2.7 g/dL (ref 2.2–3.9)
IgA: 64 mg/dL (ref 61–437)
IgG (Immunoglobin G), Serum: 721 mg/dL (ref 603–1613)
IgM (Immunoglobulin M), Srm: <5 mg/dL — ABNORMAL LOW (ref 20–172)
Total Protein ELP: 6.2 g/dL (ref 6.0–8.5)

## 2022-06-22 MED FILL — Dexamethasone Sodium Phosphate Inj 100 MG/10ML: INTRAMUSCULAR | Qty: 2 | Status: AC

## 2022-06-23 ENCOUNTER — Other Ambulatory Visit: Payer: Self-pay

## 2022-06-23 ENCOUNTER — Inpatient Hospital Stay: Payer: BC Managed Care – PPO | Attending: Hematology

## 2022-06-23 ENCOUNTER — Inpatient Hospital Stay: Payer: BC Managed Care – PPO

## 2022-06-23 VITALS — BP 124/80 | HR 86 | Temp 98.9°F | Resp 16 | Wt 158.5 lb

## 2022-06-23 DIAGNOSIS — F17210 Nicotine dependence, cigarettes, uncomplicated: Secondary | ICD-10-CM | POA: Insufficient documentation

## 2022-06-23 DIAGNOSIS — Z7189 Other specified counseling: Secondary | ICD-10-CM

## 2022-06-23 DIAGNOSIS — Z5111 Encounter for antineoplastic chemotherapy: Secondary | ICD-10-CM | POA: Diagnosis present

## 2022-06-23 DIAGNOSIS — C9010 Plasma cell leukemia not having achieved remission: Secondary | ICD-10-CM

## 2022-06-23 DIAGNOSIS — I10 Essential (primary) hypertension: Secondary | ICD-10-CM | POA: Diagnosis not present

## 2022-06-23 DIAGNOSIS — C9011 Plasma cell leukemia in remission: Secondary | ICD-10-CM | POA: Insufficient documentation

## 2022-06-23 DIAGNOSIS — M79651 Pain in right thigh: Secondary | ICD-10-CM | POA: Insufficient documentation

## 2022-06-23 DIAGNOSIS — Z95828 Presence of other vascular implants and grafts: Secondary | ICD-10-CM

## 2022-06-23 DIAGNOSIS — C9001 Multiple myeloma in remission: Secondary | ICD-10-CM | POA: Insufficient documentation

## 2022-06-23 DIAGNOSIS — M84551A Pathological fracture in neoplastic disease, right femur, initial encounter for fracture: Secondary | ICD-10-CM

## 2022-06-23 LAB — CMP (CANCER CENTER ONLY)
ALT: 12 U/L (ref 0–44)
AST: 11 U/L — ABNORMAL LOW (ref 15–41)
Albumin: 4.3 g/dL (ref 3.5–5.0)
Alkaline Phosphatase: 61 U/L (ref 38–126)
Anion gap: 7 (ref 5–15)
BUN: 35 mg/dL — ABNORMAL HIGH (ref 8–23)
CO2: 24 mmol/L (ref 22–32)
Calcium: 9.7 mg/dL (ref 8.9–10.3)
Chloride: 109 mmol/L (ref 98–111)
Creatinine: 1.81 mg/dL — ABNORMAL HIGH (ref 0.61–1.24)
GFR, Estimated: 41 mL/min — ABNORMAL LOW (ref >60)
Glucose, Bld: 147 mg/dL — ABNORMAL HIGH (ref 70–99)
Potassium: 4.3 mmol/L (ref 3.5–5.1)
Sodium: 140 mmol/L (ref 135–145)
Total Bilirubin: 0.5 mg/dL (ref 0.3–1.2)
Total Protein: 7.1 g/dL (ref 6.5–8.1)

## 2022-06-23 LAB — CBC WITH DIFFERENTIAL/PLATELET
Abs Immature Granulocytes: 0.03 10*3/uL (ref 0.00–0.07)
Basophils Absolute: 0.1 10*3/uL (ref 0.0–0.1)
Basophils Relative: 1 %
Eosinophils Absolute: 0.2 10*3/uL (ref 0.0–0.5)
Eosinophils Relative: 5 %
HCT: 36.1 % — ABNORMAL LOW (ref 39.0–52.0)
Hemoglobin: 12.7 g/dL — ABNORMAL LOW (ref 13.0–17.0)
Immature Granulocytes: 1 %
Lymphocytes Relative: 29 %
Lymphs Abs: 1.4 10*3/uL (ref 0.7–4.0)
MCH: 32.6 pg (ref 26.0–34.0)
MCHC: 35.2 g/dL (ref 30.0–36.0)
MCV: 92.8 fL (ref 80.0–100.0)
Monocytes Absolute: 0.5 10*3/uL (ref 0.1–1.0)
Monocytes Relative: 11 %
Neutro Abs: 2.5 10*3/uL (ref 1.7–7.7)
Neutrophils Relative %: 53 %
Platelets: 212 10*3/uL (ref 150–400)
RBC: 3.89 MIL/uL — ABNORMAL LOW (ref 4.22–5.81)
RDW: 15.8 % — ABNORMAL HIGH (ref 11.5–15.5)
WBC: 4.8 10*3/uL (ref 4.0–10.5)
nRBC: 0 % (ref 0.0–0.2)

## 2022-06-23 MED ORDER — FAMOTIDINE 20 MG PO TABS
20.0000 mg | ORAL_TABLET | Freq: Once | ORAL | Status: AC
  Administered 2022-06-23: 20 mg via ORAL
  Filled 2022-06-23: qty 1

## 2022-06-23 MED ORDER — DEXTROSE 5 % IV SOLN
56.0000 mg/m2 | Freq: Once | INTRAVENOUS | Status: AC
  Administered 2022-06-23: 100 mg via INTRAVENOUS
  Filled 2022-06-23: qty 15

## 2022-06-23 MED ORDER — ACETAMINOPHEN 325 MG PO TABS
650.0000 mg | ORAL_TABLET | Freq: Once | ORAL | Status: AC
  Administered 2022-06-23: 650 mg via ORAL
  Filled 2022-06-23: qty 2

## 2022-06-23 MED ORDER — SODIUM CHLORIDE 0.9 % IV SOLN
20.0000 mg | Freq: Once | INTRAVENOUS | Status: AC
  Administered 2022-06-23: 20 mg via INTRAVENOUS
  Filled 2022-06-23: qty 20

## 2022-06-23 MED ORDER — SODIUM CHLORIDE 0.9% FLUSH
10.0000 mL | INTRAVENOUS | Status: DC | PRN
  Administered 2022-06-23: 10 mL

## 2022-06-23 MED ORDER — HEPARIN SOD (PORK) LOCK FLUSH 100 UNIT/ML IV SOLN
500.0000 [IU] | Freq: Once | INTRAVENOUS | Status: AC | PRN
  Administered 2022-06-23: 500 [IU]

## 2022-06-23 MED ORDER — SODIUM CHLORIDE 0.9 % IV SOLN: Freq: Once | INTRAVENOUS | Status: AC

## 2022-06-23 MED ORDER — SODIUM CHLORIDE 0.9% FLUSH
10.0000 mL | Freq: Once | INTRAVENOUS | Status: AC
  Administered 2022-06-23: 10 mL

## 2022-06-23 MED ORDER — SODIUM CHLORIDE 0.9 % IV SOLN: INTRAVENOUS | Status: AC

## 2022-06-23 NOTE — Progress Notes (Signed)
Per Dr Lorenso Courier, ok to treat today with Scr 1.81.

## 2022-06-23 NOTE — Patient Instructions (Signed)
[facility] MEDICAL ONCOLOGY   Discharge Instructions: Thank you for choosing [facility] to provide your oncology and hematology care.   If you have a lab appointment with the Cancer Center, please go directly to the Cancer Center and check in at the registration area.   Wear comfortable clothing and clothing appropriate for easy access to any Portacath or PICC line.   We strive to give you quality time with your provider. You may need to reschedule your appointment if you arrive late (15 or more minutes).  Arriving late affects you and other patients whose appointments are after yours.  Also, if you miss three or more appointments without notifying the office, you may be dismissed from the clinic at the provider's discretion.      For prescription refill requests, have your pharmacy contact our office and allow 72 hours for refills to be completed.    Today you received the following chemotherapy and/or immunotherapy agents: carfilzomib      To help prevent nausea and vomiting after your treatment, we encourage you to take your nausea medication as directed.  BELOW ARE SYMPTOMS THAT SHOULD BE REPORTED IMMEDIATELY: *FEVER GREATER THAN 100.4 F (38 C) OR HIGHER *CHILLS OR SWEATING *NAUSEA AND VOMITING THAT IS NOT CONTROLLED WITH YOUR NAUSEA MEDICATION *UNUSUAL SHORTNESS OF BREATH *UNUSUAL BRUISING OR BLEEDING *URINARY PROBLEMS (pain or burning when urinating, or frequent urination) *BOWEL PROBLEMS (unusual diarrhea, constipation, pain near the anus) TENDERNESS IN MOUTH AND THROAT WITH OR WITHOUT PRESENCE OF ULCERS (sore throat, sores in mouth, or a toothache) UNUSUAL RASH, SWELLING OR PAIN  UNUSUAL VAGINAL DISCHARGE OR ITCHING   Items with * indicate a potential emergency and should be followed up as soon as possible or go to the Emergency Department if any problems should occur.  Please show the CHEMOTHERAPY ALERT CARD or IMMUNOTHERAPY ALERT CARD at check-in  to the Emergency Department and triage nurse.  Should you have questions after your visit or need to cancel or reschedule your appointment, please contact [facility] MEDICAL ONCOLOG[facility]  Dept: [phone]  and follow the prompts.  Office hours are 8:00 a.m. to 4:30 p.m. Monday - Friday. Please note that voicemails left after 4:00 p.m. may not be returned until the following business day.  We are closed weekends and major holidays. You have access to a nurse at all times for urgent questions. Please call the main number to the clinic Dept: [phone] and follow the prompts.   For any non-urgent questions, you may also contact your provider using MyChart. We now offer e-Visits for anyone 18 and older to request care online for non-urgent symptoms. For details visit [email or web address].   Also download the MyChart app! Go to the app store, search "MyChart", open the app, select [facility], and log in with your MyChart username and password.  Masks are optional in the cancer centers. If you would like for your care team to wear a mask while they are taking care of you, please let them know. For doctor visits, patients may have with them one support person who is at least 65 years old. At this time, visitors are not allowed in the infusion area. 

## 2022-07-03 ENCOUNTER — Other Ambulatory Visit: Payer: Self-pay

## 2022-07-04 ENCOUNTER — Other Ambulatory Visit: Payer: Self-pay

## 2022-07-07 ENCOUNTER — Inpatient Hospital Stay (HOSPITAL_BASED_OUTPATIENT_CLINIC_OR_DEPARTMENT_OTHER): Payer: BC Managed Care – PPO | Admitting: Physician Assistant

## 2022-07-07 ENCOUNTER — Ambulatory Visit (HOSPITAL_BASED_OUTPATIENT_CLINIC_OR_DEPARTMENT_OTHER)
Admission: RE | Admit: 2022-07-07 | Discharge: 2022-07-07 | Disposition: A | Discharge status: Home / Self Care | Payer: BC Managed Care – PPO | Source: Ambulatory Visit | Attending: Physician Assistant | Admitting: Physician Assistant

## 2022-07-07 ENCOUNTER — Inpatient Hospital Stay: Payer: BC Managed Care – PPO

## 2022-07-07 ENCOUNTER — Other Ambulatory Visit: Payer: Self-pay

## 2022-07-07 ENCOUNTER — Other Ambulatory Visit: Payer: Self-pay | Admitting: Hematology

## 2022-07-07 VITALS — BP 137/82 | HR 79 | Temp 97.9°F | Resp 16 | Ht 68.0 in | Wt 160.0 lb

## 2022-07-07 DIAGNOSIS — M84551A Pathological fracture in neoplastic disease, right femur, initial encounter for fracture: Secondary | ICD-10-CM

## 2022-07-07 DIAGNOSIS — C9010 Plasma cell leukemia not having achieved remission: Secondary | ICD-10-CM

## 2022-07-07 DIAGNOSIS — C9011 Plasma cell leukemia in remission: Secondary | ICD-10-CM | POA: Insufficient documentation

## 2022-07-07 DIAGNOSIS — Z5111 Encounter for antineoplastic chemotherapy: Secondary | ICD-10-CM

## 2022-07-07 DIAGNOSIS — Z95828 Presence of other vascular implants and grafts: Secondary | ICD-10-CM

## 2022-07-07 DIAGNOSIS — C9000 Multiple myeloma not having achieved remission: Secondary | ICD-10-CM | POA: Insufficient documentation

## 2022-07-07 DIAGNOSIS — Z7189 Other specified counseling: Secondary | ICD-10-CM

## 2022-07-07 DIAGNOSIS — M79604 Pain in right leg: Secondary | ICD-10-CM | POA: Diagnosis not present

## 2022-07-07 LAB — CMP (CANCER CENTER ONLY)
ALT: 11 U/L (ref 0–44)
AST: 13 U/L — ABNORMAL LOW (ref 15–41)
Albumin: 3.9 g/dL (ref 3.5–5.0)
Alkaline Phosphatase: 59 U/L (ref 38–126)
Anion gap: 5 (ref 5–15)
BUN: 24 mg/dL — ABNORMAL HIGH (ref 8–23)
CO2: 24 mmol/L (ref 22–32)
Calcium: 8.5 mg/dL — ABNORMAL LOW (ref 8.9–10.3)
Chloride: 111 mmol/L (ref 98–111)
Creatinine: 1.72 mg/dL — ABNORMAL HIGH (ref 0.61–1.24)
GFR, Estimated: 44 mL/min — ABNORMAL LOW (ref >60)
Glucose, Bld: 104 mg/dL — ABNORMAL HIGH (ref 70–99)
Potassium: 4.1 mmol/L (ref 3.5–5.1)
Sodium: 140 mmol/L (ref 135–145)
Total Bilirubin: 0.4 mg/dL (ref 0.3–1.2)
Total Protein: 6.6 g/dL (ref 6.5–8.1)

## 2022-07-07 LAB — CBC WITH DIFFERENTIAL/PLATELET
Abs Immature Granulocytes: 0.02 10*3/uL (ref 0.00–0.07)
Basophils Absolute: 0.1 10*3/uL (ref 0.0–0.1)
Basophils Relative: 1 %
Eosinophils Absolute: 0.4 10*3/uL (ref 0.0–0.5)
Eosinophils Relative: 6 %
HCT: 33.4 % — ABNORMAL LOW (ref 39.0–52.0)
Hemoglobin: 11.7 g/dL — ABNORMAL LOW (ref 13.0–17.0)
Immature Granulocytes: 0 %
Lymphocytes Relative: 30 %
Lymphs Abs: 1.8 10*3/uL (ref 0.7–4.0)
MCH: 32.8 pg (ref 26.0–34.0)
MCHC: 35 g/dL (ref 30.0–36.0)
MCV: 93.6 fL (ref 80.0–100.0)
Monocytes Absolute: 0.7 10*3/uL (ref 0.1–1.0)
Monocytes Relative: 12 %
Neutro Abs: 3.1 10*3/uL (ref 1.7–7.7)
Neutrophils Relative %: 51 %
Platelets: 195 10*3/uL (ref 150–400)
RBC: 3.57 MIL/uL — ABNORMAL LOW (ref 4.22–5.81)
RDW: 15.8 % — ABNORMAL HIGH (ref 11.5–15.5)
WBC: 6 10*3/uL (ref 4.0–10.5)
nRBC: 0 % (ref 0.0–0.2)

## 2022-07-07 MED ORDER — SODIUM CHLORIDE 0.9 % IV SOLN
60.0000 mg | Freq: Once | INTRAVENOUS | Status: DC
  Filled 2022-07-07: qty 10

## 2022-07-07 MED ORDER — SODIUM CHLORIDE 0.9 % IV SOLN: INTRAVENOUS | Status: AC

## 2022-07-07 MED ORDER — ACETAMINOPHEN 325 MG PO TABS
650.0000 mg | ORAL_TABLET | Freq: Once | ORAL | Status: AC
  Administered 2022-07-07: 650 mg via ORAL
  Filled 2022-07-07: qty 2

## 2022-07-07 MED ORDER — SODIUM CHLORIDE 0.9% FLUSH
10.0000 mL | Freq: Once | INTRAVENOUS | Status: AC
  Administered 2022-07-07: 10 mL

## 2022-07-07 MED ORDER — SODIUM CHLORIDE 0.9 % IV SOLN: Freq: Once | INTRAVENOUS | Status: AC

## 2022-07-07 MED ORDER — HEPARIN SOD (PORK) LOCK FLUSH 100 UNIT/ML IV SOLN
500.0000 [IU] | Freq: Once | INTRAVENOUS | Status: AC | PRN
  Administered 2022-07-07: 500 [IU]

## 2022-07-07 MED ORDER — SODIUM CHLORIDE 0.9 % IV SOLN
20.0000 mg | Freq: Once | INTRAVENOUS | Status: AC
  Administered 2022-07-07: 20 mg via INTRAVENOUS
  Filled 2022-07-07: qty 20

## 2022-07-07 MED ORDER — SODIUM CHLORIDE 0.9 % IV SOLN: Freq: Once | INTRAVENOUS | Status: DC

## 2022-07-07 MED ORDER — SODIUM CHLORIDE 0.9% FLUSH
10.0000 mL | INTRAVENOUS | Status: DC | PRN
  Administered 2022-07-07: 10 mL

## 2022-07-07 MED ORDER — FAMOTIDINE 20 MG PO TABS
20.0000 mg | ORAL_TABLET | Freq: Once | ORAL | Status: AC
  Administered 2022-07-07: 20 mg via ORAL
  Filled 2022-07-07: qty 1

## 2022-07-07 MED ORDER — DEXTROSE 5 % IV SOLN
56.0000 mg/m2 | Freq: Once | INTRAVENOUS | Status: AC
  Administered 2022-07-07: 100 mg via INTRAVENOUS
  Filled 2022-07-07: qty 30

## 2022-07-07 NOTE — Progress Notes (Signed)
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[place])   11/30/2017 - 10/30/2018 Chemotherapy   The patient had dexamethasone (DECADRON) 4 MG tablet, 1 of 1 cycle, Start date: --, End date: -- bortezomib SQ (VELCADE) chemo injection 2.5 mg, 1.3 mg/m2 = 2.5 mg, Subcutaneous,  Once, 14 of 15 cycles Administration: 2.25 mg (12/01/2017), 2.25 mg (12/04/2017), 2.25 mg (12/20/2017), 2.25 mg (12/24/2017), 2.25 mg (12/28/2017), 2.25 mg (12/31/2017), 2.25 mg (01/11/2018), 2.25 mg (01/14/2018), 2.25 mg (01/18/2018), 2.25 mg (01/21/2018), 2.25 mg (02/01/2018), 2.25 mg (02/04/2018), 2.25 mg (02/08/2018), 2.25 mg (02/11/2018), 2.25 mg (02/22/2018), 2.25 mg (02/25/2018), 2.25 mg (03/01/2018), 2.25 mg (03/04/2018), 2.25 mg (03/15/2018), 2.25 mg (03/18/2018), 2.25 mg (03/22/2018), 2.25 mg (03/25/2018), 2.25 mg (04/05/2018), 2.25 mg (04/08/2018), 2.25 mg (04/12/2018), 2.25 mg (04/15/2018), 2.25 mg (04/26/2018), 2.25 mg (04/29/2018), 2.25 mg (05/03/2018), 2.25 mg (05/07/2018), 2.25 mg (05/17/2018), 2.25 mg (05/20/2018), 2.25 mg (05/24/2018), 2.25 mg (05/27/2018), 2.25 mg (06/07/2018), 2.25 mg (06/10/2018), 2.25 mg (06/14/2018), 2.25 mg (06/17/2018), 2.25 mg (06/28/2018), 2.25 mg (07/02/2018), 2.25 mg (07/05/2018), 2.25 mg (07/08/2018), 2.25 mg (07/19/2018), 2.25 mg (07/22/2018), 2.25 mg (07/26/2018), 2.25 mg (07/29/2018), 2.25 mg (08/09/2018), 2.25 mg (08/13/2018), 2.25 mg (08/16/2018), 2.25 mg (08/19/2018), 2.5 mg (10/30/2018)  for chemotherapy treatment.    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 with oncologic history as above seen in the infusion center today with chief complaint of right thigh pain x1.5 weeks.  Patient states the pain is intermittent.  He describes it as an aching sensation.  Pain radiates throughout his femur, does not go to his hip or lower leg.  He rates the pain 6 out of 10 in severity.  He states the pain is worse and sometimes he is unable to walk because of it however he also has pain at rest.  He reports when the pain is present it last for about 2 minutes.  He has not tried any over-the-counter medications for pain because it dissipates so quickly.  He denies any fall or injury.  He states the pain is similar back to when he was first diagnosed with cancer.  He had a rod placed in his right femur in 2018 because of lytic lesion in the femur. He denies any fever, chills, weight loss, night sweats, chest pain, shortness of breath, palpitations, syncope, hemoptysis, back pain, leg swelling, rash.    ROS  All other systems are reviewed and are negative for acute change except as noted in the HPI.    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   Tobacco abuse      Past Surgical History:  Procedure Laterality Date   APPENDECTOMY     FEMUR IM NAIL Right 11/20/2017   Procedure: RIGHT FEMORAL INTRAMEDULLARY (IM) NAIL;  Surgeon: Nicholes Stairs, MD;  Location: [facility];  Service:  Orthopedics;  Laterality: Right;   IR IMAGING GUIDED PORT INSERTION  08/19/2021    Social History   Socioeconomic History   Marital status: Single    Spouse name: Not on file   Number of children: Not on file   Years of education: Not on file   Highest education level: Not on file  Occupational History   Not on file  Tobacco Use   Smoking status: Every Day    Packs/day: 0.25    Types: Cigarettes    Last attempt to quit: 11/09/2017    Years since quitting: 4.6   Smokeless tobacco: Never   Tobacco comments:    1 cigarette/day as of 09/25/18  Vaping Use   Vaping Use: Never used  Substance and Sexual Activity   Alcohol use: Yes   Drug use: No   Sexual activity: Not on file  Other Topics Concern   Not on file  Social History Narrative   Not on file   Social Determinants of Health   Financial Resource Strain: Not on file  Food Insecurity: Not on file  Transportation Needs: Not on file  Physical Activity: Not on file  Stress: Not on file  Social Connections: Not on file  Intimate Partner Violence: Not on file    Family History  Problem Relation Age of Onset   COPD Mother    Liver cancer Mother      Current Outpatient Medications:    acyclovir (ZOVIRAX) 400 MG tablet, Take 1 tablet (400 mg total) by mouth daily., Disp: 30 tablet, Rfl: 11   Albuterol Sulfate (PROAIR RESPICLICK) 836 (90 Base) MCG/ACT AEPB, Inhale into the lungs., Disp: , Rfl:    amoxicillin (AMOXIL) 500 MG capsule, Take 500 mg by mouth 3 (three) times daily., Disp: , Rfl:    aspirin EC 81 MG tablet, Take 1 tablet (81 mg total) by mouth daily. (Patient taking differently: Take 81 mg by mouth at bedtime.), Disp: 60 tablet, Rfl: 11   cholecalciferol (VITAMIN D) 400 units TABS tablet, Take 400 Units by mouth at bedtime. , Disp: , Rfl:    FLOVENT DISKUS 250 MCG/ACT AEPB, Inhale into the lungs., Disp: , Rfl:    ipratropium-albuterol (DUONEB) 0.5-2.5 (3) MG/3ML SOLN, Inhale into the lungs., Disp: , Rfl:     lenalidomide (REVLIMID) 10 MG capsule, Take 1 capsule (10 mg total) by mouth daily. Take for 21 days on, 7 days off, repeat every 28 days., Disp: 21 capsule, Rfl: 0   lidocaine-prilocaine (EMLA) cream, Apply 1 application topically as needed. Apply to Port-A-Cath at least 1 hr prior to treatment., Disp: 30 g, Rfl: 0   Multiple Vitamins-Minerals (MULTIVITAMIN WITH MINERALS) tablet, Take 1 tablet by mouth at bedtime. , Disp: , Rfl:    nicotine (NICODERM CQ - DOSED IN MG/24 HOURS) 14 mg/24hr patch, PLACE 1 PATCH (14 MG TOTAL) ONTO THE SKIN DAILY., Disp: 28 patch, Rfl: 1   ondansetron (ZOFRAN) 4 MG tablet, Take 2 tablets (8 mg total) by mouth every 8 (eight) hours as needed for nausea., Disp:  30 tablet, Rfl: 0   Vitamin D, Ergocalciferol, (DRISDOL) 1.25 MG (50000 UNIT) CAPS capsule, Take 1 capsule (50,000 Units total) by mouth once a week., Disp: 12 capsule, Rfl: 6 No current facility-administered medications for this visit.  Facility-Administered Medications Ordered in Other Visits:    0.9 %  sodium chloride infusion, , Intravenous, Once, Kale, Cloria Spring, MD   0.9 %  sodium chloride infusion, , Intravenous, Once, Irene Limbo, Cloria Spring, MD   pamidronate (AREDIA) 60 mg in sodium chloride 0.9 % 500 mL IVPB, 60 mg, Intravenous, Once, Kale, Cloria Spring, MD   sodium chloride flush (NS) 0.9 % injection 10 mL, 10 mL, Intracatheter, PRN, Brunetta Genera, MD, 10 mL at 09/16/21 1623   sodium chloride flush (NS) 0.9 % injection 10 mL, 10 mL, Intracatheter, PRN, Brunetta Genera, MD, 10 mL at 07/07/22 1245  PHYSICAL EXAM: ECOG FS:1 - Symptomatic but completely ambulatory  T: 97.9     BP: 130/81    HR: 74    Resp: 18    O2: 100%   Wt: 160 lbs Physical Exam Vitals and nursing note reviewed.  Constitutional:      Appearance: He is well-developed. He is not ill-appearing or toxic-appearing.  HENT:     Head: Normocephalic and atraumatic.     Nose: Nose normal.  Eyes:     General: No scleral  icterus.       Right eye: No discharge.        Left eye: No discharge.     Conjunctiva/sclera: Conjunctivae normal.  Neck:     Vascular: No JVD.  Cardiovascular:     Rate and Rhythm: Normal rate and regular rhythm.     Pulses: Normal pulses.     Heart sounds: Normal heart sounds.  Pulmonary:     Effort: Pulmonary effort is normal.     Breath sounds: Normal breath sounds.  Abdominal:     General: There is no distension.  Musculoskeletal:        General: Normal range of motion.     Cervical back: Normal range of motion.     Right lower leg: No edema.     Left lower leg: No edema.     Comments: Generalized tenderness to palpation of right thigh.  No swelling.  No overlying skin changes.  Homans sign absent bilaterally, no lower extremity edema, no palpable cords, compartments are soft  Skin:    General: Skin is warm and dry.     Capillary Refill: Capillary refill takes less than 2 seconds.     Comments: Equal tactile temperature in bilateral lower extremities  Neurological:     Mental Status: He is oriented to person, place, and time.     GCS: GCS eye subscore is 4. GCS verbal subscore is 5. GCS motor subscore is 6.     Comments: Fluent speech, no facial droop.  Psychiatric:        Behavior: Behavior normal.        LABORATORY DATA: I have reviewed the data as listed    Latest Ref Rng & Units 07/07/2022    9:39 AM 06/23/2022    9:49 AM 06/09/2022    9:49 AM  CBC  WBC 4.0 - 10.5 K/uL 6.0  4.8  6.8   Hemoglobin 13.0 - 17.0 g/dL 11.7  12.7  11.7   Hematocrit 39.0 - 52.0 % 33.4  36.1  33.6   Platelets 150 - 400 K/uL 195  212  166  Latest Ref Rng & Units 07/07/2022    9:39 AM 06/23/2022    9:49 AM 06/09/2022    9:49 AM  CMP  Glucose 70 - 99 mg/dL 104  147  99   BUN 8 - 23 mg/dL 24  35  26   Creatinine 0.61 - 1.24 mg/dL 1.72  1.81  1.60   Sodium 135 - 145 mmol/L 140  140  139   Potassium 3.5 - 5.1 mmol/L 4.1  4.3  4.1   Chloride 98 - 111 mmol/L 111  109  110    CO2 22 - 32 mmol/L $RemoveB'24  24  24   'tfrUZmdo$ Calcium 8.9 - 10.3 mg/dL 8.5  9.7  8.7   Total Protein 6.5 - 8.1 g/dL 6.6  7.1  6.6   Total Bilirubin 0.3 - 1.2 mg/dL 0.4  0.5  0.3   Alkaline Phos 38 - 126 U/L 59  61  56   AST 15 - 41 U/L $Remo'13  11  10   'DlzIo$ ALT 0 - 44 U/L $Remo'11  12  9        'Nfadu$ RADIOGRAPHIC STUDIES (from last 24 hours if applicable) I have personally reviewed the radiological images as listed and agreed with the findings in the report. VAS [name] LOWER EXTREMITY VENOUS (DVT)  Result Date: 07/07/2022  Lower Venous DVT Study Patient Name:  BURHANUDDIN KOHLMANN  Date of Exam:   07/07/2022 Medical Rec #: [id number]         Accession #:    [id number] Date of Birth: 11/22/1956         Patient Gender: M Patient Age:   15 years Exam Location:  [facility] Procedure:      VAS [name] LOWER EXTREMITY VENOUS (DVT) Referring Phys: Sherol Dade --------------------------------------------------------------------------------  Indications: Pain.  Risk Factors: Cancer. Comparison Study: No prior studies. Performing Technologist: Oliver Hum RVT  Examination Guidelines: A complete evaluation includes B-mode imaging, spectral Doppler, color Doppler, and power Doppler as needed of all accessible portions of each vessel. Bilateral testing is considered an integral part of a complete examination. Limited examinations for reoccurring indications may be performed as noted. The reflux portion of the exam is performed with the patient in reverse Trendelenburg.  +---------+---------------+---------+-----------+----------+--------------+ RIGHT    CompressibilityPhasicitySpontaneityPropertiesThrombus Aging +---------+---------------+---------+-----------+----------+--------------+ CFV      Full           Yes      Yes                                 +---------+---------------+---------+-----------+----------+--------------+ SFJ      Full                                                         +---------+---------------+---------+-----------+----------+--------------+ FV Prox  Full                                                        +---------+---------------+---------+-----------+----------+--------------+ FV Mid   Full                                                        +---------+---------------+---------+-----------+----------+--------------+  FV DistalFull                                                        +---------+---------------+---------+-----------+----------+--------------+ PFV      Full                                                        +---------+---------------+---------+-----------+----------+--------------+ POP      Full           Yes      Yes                                 +---------+---------------+---------+-----------+----------+--------------+ PTV      Full                                                        +---------+---------------+---------+-----------+----------+--------------+ PERO     Full                                                        +---------+---------------+---------+-----------+----------+--------------+   +----+---------------+---------+-----------+----------+--------------+ LEFTCompressibilityPhasicitySpontaneityPropertiesThrombus Aging +----+---------------+---------+-----------+----------+--------------+ CFV Full           Yes      Yes                                 +----+---------------+---------+-----------+----------+--------------+     Summary: RIGHT: - There is no evidence of deep vein thrombosis in the lower extremity.  - No cystic structure found in the popliteal fossa.  LEFT: - No evidence of common femoral vein obstruction.  *See table(s) above for measurements and observations. Electronically signed by Jamelle Haring on 07/07/2022 at 2:15:43 PM.    Final        ASSESSMENT & PLAN: Patient is a 66 y.o. 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 Irene Limbo.  I have viewed most recent oncology note and lab work.   #) Right leg pain- patient with tenderness to palpation of his right thigh without swelling or skin changes.  Negative Homans' sign.  As he is high risk given his history will get DVT study and x-ray of his right femur.  I did view his labs today and they show CBC without leukocytosis, hemoglobin consistent with baseline.  CMP without significant electrolyte derangement, does have kidney function consistent with baseline as well. DVT study was negative for blood clot.  There was a prolonged wait for the x-ray so patient elected to come back on Monday for it.  Appointment was made with radiology department.  I did discuss strict ED precautions discussed should symptoms worsen with patient.   #) Plasma cell leukemia/multiple myeloma- Next appointment with oncologist is 07/21/22   Visit  Diagnosis: 1. Pain of right lower extremity   2. Multiple myeloma, remission status unspecified ([place])   3. Plasma cell leukemia in remission ([place])      Orders Placed This Encounter  Procedures   DG FEMUR, MIN 2 VIEWS RIGHT    Standing Status:   Future    Standing Expiration Date:   07/08/2023    Order Specific Question:   Reason for Exam (SYMPTOM  OR DIAGNOSIS REQUIRED)    Answer:   leg pain, hx of myeloma has rod    Order Specific Question:   Preferred imaging location?    Answer:   [facility]    All questions were answered. The patient knows to call the clinic with any problems, questions or concerns. No barriers to learning was detected.  I have spent a total of 20 minutes minutes of face-to-face and non-face-to-face time, preparing to see the patient, obtaining and/or reviewing separately obtained history, performing a medically appropriate examination, counseling and educating the patient, ordering tests, documenting clinical information in the electronic health record, and care coordination (communications  with other health care professionals or caregivers).    Thank you for allowing me to participate in the care of this patient.    Barrie Folk, PA-C Department of Hematology/Oncology [facility] Phone: [phone]  [phone]    07/07/2022 5:25 PM

## 2022-07-07 NOTE — Progress Notes (Signed)
Right lower extremity venous duplex has been completed. Preliminary results can be found in CV Proc through chart review.  Results were given to [facility] PA.  07/07/22 1:13 PM Carlos Levering RVT

## 2022-07-07 NOTE — Progress Notes (Signed)
Patient declined Pamidronate today.

## 2022-07-07 NOTE — Progress Notes (Signed)
Ca 8.5, Albumin 3.9 - Ok to treat with Aredia today per Dr. Irene Limbo.  Raul Del [place], [place], BCPS, BCOP 07/07/2022. 11:55 AM

## 2022-07-07 NOTE — Progress Notes (Signed)
Per Dr. Kale, ok to treat with elevated creatinine.  

## 2022-07-07 NOTE — Patient Instructions (Signed)
[facility] MEDICAL ONCOLOGY   Discharge Instructions: Thank you for choosing [facility] to provide your oncology and hematology care.   If you have a lab appointment with the Cancer Center, please go directly to the Cancer Center and check in at the registration area.   Wear comfortable clothing and clothing appropriate for easy access to any Portacath or PICC line.   We strive to give you quality time with your provider. You may need to reschedule your appointment if you arrive late (15 or more minutes).  Arriving late affects you and other patients whose appointments are after yours.  Also, if you miss three or more appointments without notifying the office, you may be dismissed from the clinic at the provider's discretion.      For prescription refill requests, have your pharmacy contact our office and allow 72 hours for refills to be completed.    Today you received the following chemotherapy and/or immunotherapy agents: carfilzomib      To help prevent nausea and vomiting after your treatment, we encourage you to take your nausea medication as directed.  BELOW ARE SYMPTOMS THAT SHOULD BE REPORTED IMMEDIATELY: *FEVER GREATER THAN 100.4 F (38 C) OR HIGHER *CHILLS OR SWEATING *NAUSEA AND VOMITING THAT IS NOT CONTROLLED WITH YOUR NAUSEA MEDICATION *UNUSUAL SHORTNESS OF BREATH *UNUSUAL BRUISING OR BLEEDING *URINARY PROBLEMS (pain or burning when urinating, or frequent urination) *BOWEL PROBLEMS (unusual diarrhea, constipation, pain near the anus) TENDERNESS IN MOUTH AND THROAT WITH OR WITHOUT PRESENCE OF ULCERS (sore throat, sores in mouth, or a toothache) UNUSUAL RASH, SWELLING OR PAIN  UNUSUAL VAGINAL DISCHARGE OR ITCHING   Items with * indicate a potential emergency and should be followed up as soon as possible or go to the Emergency Department if any problems should occur.  Please show the CHEMOTHERAPY ALERT CARD or IMMUNOTHERAPY ALERT CARD at check-in  to the Emergency Department and triage nurse.  Should you have questions after your visit or need to cancel or reschedule your appointment, please contact [facility] MEDICAL ONCOLOGY  Dept: [phone]  and follow the prompts.  Office hours are 8:00 a.m. to 4:30 p.m. Monday - Friday. Please note that voicemails left after 4:00 p.m. may not be returned until the following business day.  We are closed weekends and major holidays. You have access to a nurse at all times for urgent questions. Please call the main number to the clinic Dept: [phone] and follow the prompts.   For any non-urgent questions, you may also contact your provider using MyChart. We now offer e-Visits for anyone 18 and older to request care online for non-urgent symptoms. For details visit [email or web address].   Also download the MyChart app! Go to the app store, search "MyChart", open the app, select , and log in with your MyChart username and password.  Masks are optional in the cancer centers. If you would like for your care team to wear a mask while they are taking care of you, please let them know. For doctor visits, patients may have with them one support person who is at least 65 years old. At this time, visitors are not allowed in the infusion area. 

## 2022-07-10 ENCOUNTER — Ambulatory Visit (HOSPITAL_COMMUNITY)
Admission: RE | Admit: 2022-07-10 | Discharge: 2022-07-10 | Disposition: A | Discharge status: Home / Self Care | Payer: BC Managed Care – PPO | Source: Ambulatory Visit | Attending: Physician Assistant | Admitting: Physician Assistant

## 2022-07-10 ENCOUNTER — Other Ambulatory Visit (HOSPITAL_COMMUNITY): Payer: BC Managed Care – PPO

## 2022-07-10 DIAGNOSIS — M79604 Pain in right leg: Secondary | ICD-10-CM | POA: Diagnosis not present

## 2022-07-10 LAB — KAPPA/LAMBDA LIGHT CHAINS
Kappa free light chain: 26.2 mg/L — ABNORMAL HIGH (ref 3.3–19.4)
Kappa, lambda light chain ratio: 1.47 (ref 0.26–1.65)
Lambda free light chains: 17.8 mg/L (ref 5.7–26.3)

## 2022-07-11 ENCOUNTER — Other Ambulatory Visit: Payer: Self-pay

## 2022-07-11 LAB — MULTIPLE MYELOMA PANEL, SERUM
Albumin SerPl Elph-Mcnc: 3.5 g/dL (ref 2.9–4.4)
Albumin/Glob SerPl: 1.3 (ref 0.7–1.7)
Alpha 1: 0.2 g/dL (ref 0.0–0.4)
Alpha2 Glob SerPl Elph-Mcnc: 1 g/dL (ref 0.4–1.0)
B-Globulin SerPl Elph-Mcnc: 0.8 g/dL (ref 0.7–1.3)
Gamma Glob SerPl Elph-Mcnc: 0.6 g/dL (ref 0.4–1.8)
Globulin, Total: 2.7 g/dL (ref 2.2–3.9)
IgA: 67 mg/dL (ref 61–437)
IgG (Immunoglobin G), Serum: 646 mg/dL (ref 603–1613)
IgM (Immunoglobulin M), Srm: 6 mg/dL — ABNORMAL LOW (ref 20–172)
Total Protein ELP: 6.2 g/dL (ref 6.0–8.5)

## 2022-07-12 ENCOUNTER — Other Ambulatory Visit: Payer: Self-pay

## 2022-07-12 DIAGNOSIS — C9010 Plasma cell leukemia not having achieved remission: Secondary | ICD-10-CM

## 2022-07-12 MED ORDER — LENALIDOMIDE 10 MG PO CAPS: 10.0000 mg | ORAL_CAPSULE | Freq: Every day | ORAL | 0 refills | Status: DC

## 2022-07-13 ENCOUNTER — Other Ambulatory Visit: Payer: Self-pay

## 2022-07-15 ENCOUNTER — Other Ambulatory Visit: Payer: Self-pay

## 2022-07-20 MED FILL — Dexamethasone Sodium Phosphate Inj 100 MG/10ML: INTRAMUSCULAR | Qty: 2 | Status: AC

## 2022-07-21 ENCOUNTER — Inpatient Hospital Stay: Payer: Medicare Other | Attending: Hematology

## 2022-07-21 ENCOUNTER — Inpatient Hospital Stay (HOSPITAL_BASED_OUTPATIENT_CLINIC_OR_DEPARTMENT_OTHER): Payer: Medicare Other | Admitting: Hematology

## 2022-07-21 ENCOUNTER — Inpatient Hospital Stay: Payer: Medicare Other

## 2022-07-21 ENCOUNTER — Other Ambulatory Visit: Payer: Self-pay

## 2022-07-21 VITALS — BP 115/83 | HR 79 | Temp 97.7°F | Resp 20 | Wt 159.0 lb

## 2022-07-21 DIAGNOSIS — Z7189 Other specified counseling: Secondary | ICD-10-CM

## 2022-07-21 DIAGNOSIS — M84551A Pathological fracture in neoplastic disease, right femur, initial encounter for fracture: Secondary | ICD-10-CM

## 2022-07-21 DIAGNOSIS — Z5111 Encounter for antineoplastic chemotherapy: Secondary | ICD-10-CM

## 2022-07-21 DIAGNOSIS — Z87891 Personal history of nicotine dependence: Secondary | ICD-10-CM | POA: Insufficient documentation

## 2022-07-21 DIAGNOSIS — C9000 Multiple myeloma not having achieved remission: Secondary | ICD-10-CM | POA: Insufficient documentation

## 2022-07-21 DIAGNOSIS — Z95828 Presence of other vascular implants and grafts: Secondary | ICD-10-CM

## 2022-07-21 DIAGNOSIS — C9010 Plasma cell leukemia not having achieved remission: Secondary | ICD-10-CM

## 2022-07-21 LAB — CMP (CANCER CENTER ONLY)
ALT: 12 U/L (ref 0–44)
AST: 11 U/L — ABNORMAL LOW (ref 15–41)
Albumin: 4.1 g/dL (ref 3.5–5.0)
Alkaline Phosphatase: 63 U/L (ref 38–126)
Anion gap: 5 (ref 5–15)
BUN: 26 mg/dL — ABNORMAL HIGH (ref 8–23)
CO2: 26 mmol/L (ref 22–32)
Calcium: 8.8 mg/dL — ABNORMAL LOW (ref 8.9–10.3)
Chloride: 110 mmol/L (ref 98–111)
Creatinine: 1.55 mg/dL — ABNORMAL HIGH (ref 0.61–1.24)
GFR, Estimated: 49 mL/min — ABNORMAL LOW (ref >60)
Glucose, Bld: 103 mg/dL — ABNORMAL HIGH (ref 70–99)
Potassium: 3.9 mmol/L (ref 3.5–5.1)
Sodium: 141 mmol/L (ref 135–145)
Total Bilirubin: 0.5 mg/dL (ref 0.3–1.2)
Total Protein: 6.7 g/dL (ref 6.5–8.1)

## 2022-07-21 LAB — CBC WITH DIFFERENTIAL/PLATELET
Abs Immature Granulocytes: 0.03 10*3/uL (ref 0.00–0.07)
Basophils Absolute: 0.2 10*3/uL — ABNORMAL HIGH (ref 0.0–0.1)
Basophils Relative: 2 %
Eosinophils Absolute: 0.2 10*3/uL (ref 0.0–0.5)
Eosinophils Relative: 3 %
HCT: 33.2 % — ABNORMAL LOW (ref 39.0–52.0)
Hemoglobin: 11.8 g/dL — ABNORMAL LOW (ref 13.0–17.0)
Immature Granulocytes: 1 %
Lymphocytes Relative: 25 %
Lymphs Abs: 1.6 10*3/uL (ref 0.7–4.0)
MCH: 32.8 pg (ref 26.0–34.0)
MCHC: 35.5 g/dL (ref 30.0–36.0)
MCV: 92.2 fL (ref 80.0–100.0)
Monocytes Absolute: 0.9 10*3/uL (ref 0.1–1.0)
Monocytes Relative: 14 %
Neutro Abs: 3.7 10*3/uL (ref 1.7–7.7)
Neutrophils Relative %: 55 %
Platelets: 219 10*3/uL (ref 150–400)
RBC: 3.6 MIL/uL — ABNORMAL LOW (ref 4.22–5.81)
RDW: 16 % — ABNORMAL HIGH (ref 11.5–15.5)
WBC: 6.6 10*3/uL (ref 4.0–10.5)
nRBC: 0 % (ref 0.0–0.2)

## 2022-07-21 MED ORDER — FAMOTIDINE 20 MG PO TABS
20.0000 mg | ORAL_TABLET | Freq: Once | ORAL | Status: AC
  Administered 2022-07-21: 20 mg via ORAL

## 2022-07-21 MED ORDER — DEXTROSE 5 % IV SOLN
56.0000 mg/m2 | Freq: Once | INTRAVENOUS | Status: AC
  Administered 2022-07-21: 100 mg via INTRAVENOUS
  Filled 2022-07-21: qty 30

## 2022-07-21 MED ORDER — HEPARIN SOD (PORK) LOCK FLUSH 100 UNIT/ML IV SOLN
500.0000 [IU] | Freq: Once | INTRAVENOUS | Status: AC | PRN
  Administered 2022-07-21: 500 [IU]

## 2022-07-21 MED ORDER — SODIUM CHLORIDE 0.9 % IV SOLN: INTRAVENOUS | Status: AC

## 2022-07-21 MED ORDER — SODIUM CHLORIDE 0.9 % IV SOLN: Freq: Once | INTRAVENOUS | Status: DC

## 2022-07-21 MED ORDER — SODIUM CHLORIDE 0.9 % IV SOLN
20.0000 mg | Freq: Once | INTRAVENOUS | Status: AC
  Administered 2022-07-21: 20 mg via INTRAVENOUS
  Filled 2022-07-21: qty 20

## 2022-07-21 MED ORDER — SODIUM CHLORIDE 0.9 % IV SOLN: Freq: Once | INTRAVENOUS | Status: AC

## 2022-07-21 MED ORDER — SODIUM CHLORIDE 0.9% FLUSH
10.0000 mL | Freq: Once | INTRAVENOUS | Status: AC
  Administered 2022-07-21: 10 mL

## 2022-07-21 MED ORDER — ACETAMINOPHEN 325 MG PO TABS
650.0000 mg | ORAL_TABLET | Freq: Once | ORAL | Status: AC
  Administered 2022-07-21: 650 mg via ORAL

## 2022-07-21 MED ORDER — FAMOTIDINE 20 MG PO TABS
ORAL_TABLET | ORAL | Status: AC
  Filled 2022-07-21: qty 1

## 2022-07-21 MED ORDER — SODIUM CHLORIDE 0.9 % IV SOLN
60.0000 mg | Freq: Once | INTRAVENOUS | Status: AC
  Administered 2022-07-21: 60 mg via INTRAVENOUS
  Filled 2022-07-21: qty 10

## 2022-07-21 MED ORDER — ACETAMINOPHEN 325 MG PO TABS
ORAL_TABLET | ORAL | Status: AC
  Filled 2022-07-21: qty 2

## 2022-07-21 MED ORDER — SODIUM CHLORIDE 0.9% FLUSH
10.0000 mL | INTRAVENOUS | Status: DC | PRN
  Administered 2022-07-21: 10 mL

## 2022-07-21 NOTE — Patient Instructions (Signed)
[facility] ONCOLOGY  Discharge Instructions: Thank you for choosing [facility] to provide your oncology and hematology care.   If you have a lab appointment with the [facility], please go directly to the [facility] and check in at the registration area.   Wear comfortable clothing and clothing appropriate for easy access to any Portacath or PICC line.   We strive to give you quality time with your provider. You may need to reschedule your appointment if you arrive late (15 or more minutes).  Arriving late affects you and other patients whose appointments are after yours.  Also, if you miss three or more appointments without notifying the office, you may be dismissed from the clinic at the provider's discretion.      For prescription refill requests, have your pharmacy contact our office and allow 72 hours for refills to be completed.    Today you received the following chemotherapy and/or immunotherapy agents: Kyprolis, Aredia      To help prevent nausea and vomiting after your treatment, we encourage you to take your nausea medication as directed.  BELOW ARE SYMPTOMS THAT SHOULD BE REPORTED IMMEDIATELY: *FEVER GREATER THAN 100.4 F (38 C) OR HIGHER *CHILLS OR SWEATING *NAUSEA AND VOMITING THAT IS NOT CONTROLLED WITH YOUR NAUSEA MEDICATION *UNUSUAL SHORTNESS OF BREATH *UNUSUAL BRUISING OR BLEEDING *URINARY PROBLEMS (pain or burning when urinating, or frequent urination) *BOWEL PROBLEMS (unusual diarrhea, constipation, pain near the anus) TENDERNESS IN MOUTH AND THROAT WITH OR WITHOUT PRESENCE OF ULCERS (sore throat, sores in mouth, or a toothache) UNUSUAL RASH, SWELLING OR PAIN  UNUSUAL VAGINAL DISCHARGE OR ITCHING   Items with * indicate a potential emergency and should be followed up as soon as possible or go to the Emergency Department if any problems should occur.  Please show the CHEMOTHERAPY ALERT CARD or IMMUNOTHERAPY ALERT CARD at  check-in to the Emergency Department and triage nurse.  Should you have questions after your visit or need to cancel or reschedule your appointment, please contact [facility]  Dept: [phone]  and follow the prompts.  Office hours are 8:00 a.m. to 4:30 p.m. Monday - Friday. Please note that voicemails left after 4:00 p.m. may not be returned until the following business day.  We are closed weekends and major holidays. You have access to a nurse at all times for urgent questions. Please call the main number to the clinic Dept: [phone] and follow the prompts.   For any non-urgent questions, you may also contact your provider using MyChart. We now offer e-Visits for anyone 33 and older to request care online for non-urgent symptoms. For details visit [email or web address].   Also download the MyChart app! Go to the app store, search "MyChart", open the app, select [facility], and log in with your MyChart username and password.  Masks are optional in the cancer centers. If you would like for your care team to wear a mask while they are taking care of you, please let them know. You may have one support person who is at least 65 years old accompany you for your appointments.

## 2022-07-21 NOTE — Progress Notes (Signed)
Per Irene Limbo MD, ok to treat with SCR 1.55

## 2022-07-24 ENCOUNTER — Telehealth: Payer: Self-pay | Admitting: Hematology

## 2022-07-24 NOTE — Telephone Encounter (Signed)
Left message with follow-up appointments per 8/11 los.

## 2022-07-26 ENCOUNTER — Other Ambulatory Visit: Payer: Self-pay

## 2022-07-27 ENCOUNTER — Encounter: Payer: Self-pay | Admitting: Hematology

## 2022-07-27 NOTE — Progress Notes (Signed)
HEMATOLOGY/ONCOLOGY CLINIC NOTE  Date of Service: 07/21/2022   CC Follow-up continued evaluation and management of plasma cell leukemia/multiple myeloma  HISTORY OF PRESENTING ILLNESS:  Please see previous note for details of initial presentation.  INTERVAL HISTORY:  Andres Fernandez is here for continued evaluation and management of plasma cell leukemia/multiple myeloma for continued aggressive maintenance therapy with carfilzomib and Revlimid  Patient reports some acute on chronic discomfort in his right thigh and was seen in the symptom management clinic on 07/07/2022.  He had an ultrasound of the left lower extremity which showed no evidence of DVT. X-ray of the right femur on 07/10/2022 showed no acute osseous abnormalities in the right femur and no evidence of right femoral hardware complication.  Patient notes that the pain has since abated.  He thinks he might just be more active and putting more pressure on the lower extremities from his work and travels and will adjust his workload accordingly.  No acute new skin rashes.  No new dental issues.  No concerns with shingles. No new infection issues. No other new focal bone pains. Other notable toxicities from current treatment regimen.  Labs done today were reviewed with him in detail  REVIEW OF SYSTEMS:   10 Point review of Systems was done is negative except as noted above.  MEDICAL HISTORY:  Past Medical History:  Diagnosis Date   BPH (benign prostatic hyperplasia)    Hepatitis C 11/27/2017   Hypertension    Plasma cell leukemia ([place]) 11/23/2017   Tobacco abuse     SURGICAL HISTORY: Past Surgical History:  Procedure Laterality Date   APPENDECTOMY     FEMUR IM NAIL Right 11/20/2017   Procedure: RIGHT FEMORAL INTRAMEDULLARY (IM) NAIL;  Surgeon: Nicholes Stairs, MD;  Location: [facility];  Service: Orthopedics;  Laterality: Right;   IR IMAGING GUIDED PORT INSERTION  08/19/2021    SOCIAL HISTORY: Social  History   Socioeconomic History   Marital status: Single    Spouse name: Not on file   Number of children: Not on file   Years of education: Not on file   Highest education level: Not on file  Occupational History   Not on file  Tobacco Use   Smoking status: Every Day    Packs/day: 0.25    Types: Cigarettes    Last attempt to quit: 11/09/2017    Years since quitting: 4.7   Smokeless tobacco: Never   Tobacco comments:    1 cigarette/day as of 09/25/18  Vaping Use   Vaping Use: Never used  Substance and Sexual Activity   Alcohol use: Yes   Drug use: No   Sexual activity: Not on file  Other Topics Concern   Not on file  Social History Narrative   Not on file   Social Determinants of Health   Financial Resource Strain: Not on file  Food Insecurity: Not on file  Transportation Needs: Not on file  Physical Activity: Not on file  Stress: Not on file  Social Connections: Not on file  Intimate Partner Violence: Not on file    FAMILY HISTORY: Family History  Problem Relation Age of Onset   COPD Mother    Liver cancer Mother     ALLERGIES:  has No Known Allergies.  MEDICATIONS:  . Current Outpatient Medications on File Prior to Visit  Medication Sig Dispense Refill   acyclovir (ZOVIRAX) 400 MG tablet Take 1 tablet (400 mg total) by mouth daily. 30 tablet 11  Albuterol Sulfate (PROAIR RESPICLICK) 462 (90 Base) MCG/ACT AEPB Inhale into the lungs.     amoxicillin (AMOXIL) 500 MG capsule Take 500 mg by mouth 3 (three) times daily.     aspirin EC 81 MG tablet Take 1 tablet (81 mg total) by mouth daily. (Patient taking differently: Take 81 mg by mouth at bedtime.) 60 tablet 11   cholecalciferol (VITAMIN D) 400 units TABS tablet Take 400 Units by mouth at bedtime.      FLOVENT DISKUS 250 MCG/ACT AEPB Inhale into the lungs.     ipratropium-albuterol (DUONEB) 0.5-2.5 (3) MG/3ML SOLN Inhale into the lungs.     lenalidomide (REVLIMID) 10 MG capsule Take 1 capsule (10 mg total)  by mouth daily. Take for 21 days on, 7 days off, repeat every 28 days. 21 capsule 0   lidocaine-prilocaine (EMLA) cream Apply 1 application topically as needed. Apply to Port-A-Cath at least 1 hr prior to treatment. 30 g 0   Multiple Vitamins-Minerals (MULTIVITAMIN WITH MINERALS) tablet Take 1 tablet by mouth at bedtime.      nicotine (NICODERM CQ - DOSED IN MG/24 HOURS) 14 mg/24hr patch PLACE 1 PATCH (14 MG TOTAL) ONTO THE SKIN DAILY. 28 patch 1   ondansetron (ZOFRAN) 4 MG tablet Take 2 tablets (8 mg total) by mouth every 8 (eight) hours as needed for nausea. 30 tablet 0   Vitamin D, Ergocalciferol, (DRISDOL) 1.25 MG (50000 UNIT) CAPS capsule Take 1 capsule (50,000 Units total) by mouth once a week. 12 capsule 6   Current Facility-Administered Medications on File Prior to Visit  Medication Dose Route Frequency Provider Last Rate Last Admin   0.9 %  sodium chloride infusion   Intravenous Once Brunetta Genera, MD       acetaminophen (TYLENOL) 325 MG tablet            famotidine (PEPCID) 20 MG tablet            sodium chloride flush (NS) 0.9 % injection 10 mL  10 mL Intracatheter PRN Brunetta Genera, MD   10 mL at 09/16/21 1623    PHYSICAL EXAMINATION:  ECOG FS:1 - Symptomatic but completely ambulatory Vitals:   07/21/22 1005  BP: 115/83  Pulse: 79  Resp: 20  Temp: 97.7 F (36.5 C)  SpO2: 100%   Wt Readings from Last 3 Encounters:  07/21/22 159 lb (72.1 kg)  07/07/22 160 lb (72.6 kg)  06/23/22 158 lb 8 oz (71.9 kg)  . GENERAL:alert, in no acute distress and comfortable SKIN: no acute rashes, no significant lesions EYES: conjunctiva are pink and non-injected, sclera anicteric OROPHARYNX: MMM, no exudates, no oropharyngeal erythema or ulceration NECK: supple, no JVD LYMPH:  no palpable lymphadenopathy in the cervical, axillary or inguinal regions LUNGS: clear to auscultation b/l with normal respiratory effort HEART: regular rate & rhythm ABDOMEN:  normoactive bowel sounds  , non tender, not distended. Extremity: no pedal edema PSYCH: alert & oriented x 3 with fluent speech NEURO: no focal motor/sensory deficits   LABORATORY DATA:  I have reviewed the data as listed     Latest Ref Rng & Units 07/21/2022    9:43 AM 07/07/2022    9:39 AM 06/23/2022    9:49 AM  CBC  WBC 4.0 - 10.5 K/uL 6.6  6.0  4.8   Hemoglobin 13.0 - 17.0 g/dL 11.8  11.7  12.7   Hematocrit 39.0 - 52.0 % 33.2  33.4  36.1   Platelets 150 - 400 K/uL 219  195  212    CBC    Component Value Date/Time   WBC 6.6 07/21/2022 0943   RBC 3.60 (L) 07/21/2022 0943   HGB 11.8 (L) 07/21/2022 0943   HGB 11.9 (L) 07/08/2021 1009   HGB 8.3 (L) 12/14/2017 1230   HCT 33.2 (L) 07/21/2022 0943   HCT 23.9 (L) 12/14/2017 1230   PLT 219 07/21/2022 0943   PLT 211 07/08/2021 1009   PLT 524 (H) 12/14/2017 1230   MCV 92.2 07/21/2022 0943   MCV 88.4 12/14/2017 1230   MCH 32.8 07/21/2022 0943   MCHC 35.5 07/21/2022 0943   RDW 16.0 (H) 07/21/2022 0943   RDW 15.3 (H) 12/14/2017 1230   LYMPHSABS 1.6 07/21/2022 0943   LYMPHSABS 1.4 12/14/2017 1230   MONOABS 0.9 07/21/2022 0943   MONOABS 0.6 12/14/2017 1230   EOSABS 0.2 07/21/2022 0943   EOSABS 0.1 12/14/2017 1230   BASOSABS 0.2 (H) 07/21/2022 0943   BASOSABS 0.1 12/14/2017 1230        Latest Ref Rng & Units 07/21/2022    9:43 AM 07/07/2022    9:39 AM 06/23/2022    9:49 AM  CMP  Glucose 70 - 99 mg/dL 103  104  147   BUN 8 - 23 mg/dL 26  24  35   Creatinine 0.61 - 1.24 mg/dL 1.55  1.72  1.81   Sodium 135 - 145 mmol/L 141  140  140   Potassium 3.5 - 5.1 mmol/L 3.9  4.1  4.3   Chloride 98 - 111 mmol/L 110  111  109   CO2 22 - 32 mmol/L _0 Calcium 8.9 - 10.3 mg/dL 8.8  8.5  9.7   Total Protein 6.5 - 8.1 g/dL 6.7  6.6  7.1   Total Bilirubin 0.3 - 1.2 mg/dL 0.5  0.4  0.5   Alkaline Phos 38 - 126 U/L 63  59  61   AST 15 - 41 U/L _1 ALT 0 - 44 U/L _2 03/09/20 ([id number]) Bone Marrow Biopsy at [facility]   07/02/2019 Bone Marrow Biopsy ([id number]) at [facility]    08/07/18 BM Bx:        RADIOGRAPHIC STUDIES: I have personally reviewed the radiological images as listed and agreed with the findings in the report. DG FEMUR, MIN 2 VIEWS RIGHT  Result Date: 07/10/2022 CLINICAL DATA:  Right lower extremity pain. No recent injury. History of myeloma and rod. EXAM: RIGHT FEMUR 2 VIEWS COMPARISON:  11/17/2017 bone survey FINDINGS: Intramedullary rod in the right femoral shaft with 2 interlocking proximal right femoral neck pins and distal interlocking screw, with no evidence of hardware fracture or loosening. No bone fracture. No right hip or right knee dislocation. No discrete osseous lesions. IMPRESSION: No acute osseous abnormality in the right femur. No evidence of right femoral hardware complication. Electronically Signed   By: Ilona Sorrel M.D.   On: 07/10/2022 08:41   VAS [name] LOWER EXTREMITY VENOUS (DVT)  Result Date: 07/07/2022  Lower Venous DVT Study Patient Name:  LIAHM GRIVAS  Date of Exam:   07/07/2022 Medical Rec #: [id number]         Accession #:    [id number] Date of Birth: Apr 12, 1956         Patient Gender: M Patient Age:   89 years Exam Location:  [facility] Procedure:  VAS [name] LOWER EXTREMITY VENOUS (DVT) Referring Phys: Sherol Dade --------------------------------------------------------------------------------  Indications: Pain.  Risk Factors: Cancer. Comparison Study: No prior studies. Performing Technologist: Oliver Hum RVT  Examination Guidelines: A complete evaluation includes B-mode imaging, spectral Doppler, color Doppler, and power Doppler as needed of all accessible portions of each vessel. Bilateral testing is considered an integral part of a complete examination. Limited examinations for reoccurring indications may be performed as noted. The reflux portion of the exam is performed with the patient in reverse Trendelenburg.   +---------+---------------+---------+-----------+----------+--------------+ RIGHT    CompressibilityPhasicitySpontaneityPropertiesThrombus Aging +---------+---------------+---------+-----------+----------+--------------+ CFV      Full           Yes      Yes                                 +---------+---------------+---------+-----------+----------+--------------+ SFJ      Full                                                        +---------+---------------+---------+-----------+----------+--------------+ FV Prox  Full                                                        +---------+---------------+---------+-----------+----------+--------------+ FV Mid   Full                                                        +---------+---------------+---------+-----------+----------+--------------+ FV DistalFull                                                        +---------+---------------+---------+-----------+----------+--------------+ PFV      Full                                                        +---------+---------------+---------+-----------+----------+--------------+ POP      Full           Yes      Yes                                 +---------+---------------+---------+-----------+----------+--------------+ PTV      Full                                                        +---------+---------------+---------+-----------+----------+--------------+ PERO     Full                                                        +---------+---------------+---------+-----------+----------+--------------+   +----+---------------+---------+-----------+----------+--------------+  LEFTCompressibilityPhasicitySpontaneityPropertiesThrombus Aging +----+---------------+---------+-----------+----------+--------------+ CFV Full           Yes      Yes                                  +----+---------------+---------+-----------+----------+--------------+     Summary: RIGHT: - There is no evidence of deep vein thrombosis in the lower extremity.  - No cystic structure found in the popliteal fossa.  LEFT: - No evidence of common femoral vein obstruction.  *See table(s) above for measurements and observations. Electronically signed by Jamelle Haring on 07/07/2022 at 2:15:43 PM.    Final     ASSESSMENT & PLAN:    66 y.o. 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   Initial BM Bx showed >95% involvement with kappa restricted serum free light chains. 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 MoL Cy-   08/07/18 BM Bx revealed normocellular bone marrow with trilineage hematopoiesis and 1% plasma cells which indicates the pt is in remission 08/06/18 PET/CT revealed  Expansile lytic lesion involving the left iliac bone, unchanged. Lytic lesion involving the sternum, unchanged. While both are suspicious for myeloma, neither lesion is FDG avid. No hypermetabolic osseous lesions in the visualized axial and appendicular skeleton. Please note that the bilateral lower extremities were not imaged. No suspicious lymphadenopathy. Spleen is normal in size.    Pt began Ninlaro 78m maintenance after pt finished 13 cycles of CyBorD and BM bx showed only 1% plasma cells, then resumed CyBorD and held Ninlaro in anticipation of his tandem bone marrow transplants beginning in late December 2019 [facility]  11/19/18 High dose chemotherapy with Melphalan 1431mm2 and autologous stem cell rescue  03/20/19 High dose chemotherapy with Melphalan 20083m2 and autologous stem cell rescue.  Non-infectious diarrhea, uncomplicated course otherwise.  07/02/2019 PET scan with results revealing "1. No FDG avid bone lesions are identified. 2.  Asymmetric uptake within the right, greater than left, palatine tonsils with mild fullness of the right tonsillar soft tissue. ENT consult vs attention on follow up suggested. 3.  Innumerable mixed sclerotic and lytic lesions throughout the axial and appendicular skeleton without associated hypermetabolic activity. 4.  Ancillary CT findings as above."  07/02/2019 bone marrow biopsy ([id number])with results showing: "Trilineage hematopoieses with maturation. No plasma cell myeloma identified. Mild anemia and No Rouleaux or circulating plasma cells identified in the peripheral blood."  2) s/p Prophylactic IM nailing for rt femur  [facility] previously reported improvement and is currently progressively back to full time work.  3) Thrombocytopenia resolved   4) Renal insufficiency --likely related to multiple myeloma.  Remained fairly stable with baseline creatinine of 1.5-2.1.  Creatinine is 1.6 today Plan -maintain follow up with nephrology referral to Dr ColMarval RegalaNarda Amberdney for continued treatment -Pt completed 8 weeks of maverick for hep c with Dr ComLinus Salmons 07/08/18.  -Kidney numbers improved. Creatinine in the 1.7-2.2 range   5) h/o tobacco abuse-counseled on smoking cessation -Currently not smoking.   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 Hemoglobin is stable at 11.6  PLAN: -Patient had some right thigh pain and had work-up that ruled out DVT and any other new osseous lesions or hardware complications.  His right thigh pain has since abated. -No other acute new focal symptoms noted. -No notable toxicities from his current treatment. -Was done today show stable CBC with mild anemia but normal  platelets and WBC count Stable CMP Last myeloma labs on 07/07/2022 showed normal serum kappa lambda free light chain ratio.  Myeloma panel with no M spike. -We will continue his carfilzomib maintenance at 56 mg per metered square every 2 weeks -We will continue his maintenance Revlimid at 10 mg p.o. daily 3 weeks on 1 week off. -Continue aspirin 81 mg p.o. daily for VTE prophylaxis. -Continue ergocalciferol 50,000 units weekly -Continue daily multivitamin -Continue Aredia every 8 weeks for maintenance.  FOLLOW UP: Please schedule next 6 doses of maintenance carfilzomib every 2 weeks with port flush and labs MD visit in 6 weeks    The total time spent in the appointment was 30 minutes*.  All of the patient's questions were answered with apparent satisfaction. The patient knows to call the clinic with any problems, questions or concerns.   Sullivan Lone MD MS AAHIVMS [facility] Hematology/Oncology Physician [facility]  .*Total Encounter Time as defined by the Centers for Medicare and Medicaid Services includes, in addition to the face-to-face time of a patient visit (documented in the note above) non-face-to-face time: obtaining and reviewing outside history, ordering and reviewing medications, tests or procedures, care coordination (communications with other health care professionals or caregivers) and documentation in the medical record.

## 2022-08-03 MED FILL — Dexamethasone Sodium Phosphate Inj 100 MG/10ML: INTRAMUSCULAR | Qty: 2 | Status: AC

## 2022-08-04 ENCOUNTER — Inpatient Hospital Stay: Payer: Medicare Other

## 2022-08-04 ENCOUNTER — Other Ambulatory Visit: Payer: Self-pay

## 2022-08-04 VITALS — BP 117/87 | HR 86 | Temp 98.2°F | Resp 18

## 2022-08-04 DIAGNOSIS — Z7189 Other specified counseling: Secondary | ICD-10-CM

## 2022-08-04 DIAGNOSIS — Z5111 Encounter for antineoplastic chemotherapy: Secondary | ICD-10-CM

## 2022-08-04 DIAGNOSIS — C9010 Plasma cell leukemia not having achieved remission: Secondary | ICD-10-CM

## 2022-08-04 DIAGNOSIS — M84551A Pathological fracture in neoplastic disease, right femur, initial encounter for fracture: Secondary | ICD-10-CM

## 2022-08-04 DIAGNOSIS — Z95828 Presence of other vascular implants and grafts: Secondary | ICD-10-CM

## 2022-08-04 LAB — CMP (CANCER CENTER ONLY)
ALT: 12 U/L (ref 0–44)
AST: 12 U/L — ABNORMAL LOW (ref 15–41)
Albumin: 4 g/dL (ref 3.5–5.0)
Alkaline Phosphatase: 61 U/L (ref 38–126)
Anion gap: 7 (ref 5–15)
BUN: 26 mg/dL — ABNORMAL HIGH (ref 8–23)
CO2: 25 mmol/L (ref 22–32)
Calcium: 8.9 mg/dL (ref 8.9–10.3)
Chloride: 110 mmol/L (ref 98–111)
Creatinine: 1.8 mg/dL — ABNORMAL HIGH (ref 0.61–1.24)
GFR, Estimated: 41 mL/min — ABNORMAL LOW (ref >60)
Glucose, Bld: 125 mg/dL — ABNORMAL HIGH (ref 70–99)
Potassium: 4 mmol/L (ref 3.5–5.1)
Sodium: 142 mmol/L (ref 135–145)
Total Bilirubin: 0.3 mg/dL (ref 0.3–1.2)
Total Protein: 6.3 g/dL — ABNORMAL LOW (ref 6.5–8.1)

## 2022-08-04 LAB — CBC WITH DIFFERENTIAL/PLATELET
Abs Immature Granulocytes: 0.02 10*3/uL (ref 0.00–0.07)
Basophils Absolute: 0.1 10*3/uL (ref 0.0–0.1)
Basophils Relative: 1 %
Eosinophils Absolute: 0.4 10*3/uL (ref 0.0–0.5)
Eosinophils Relative: 5 %
HCT: 32 % — ABNORMAL LOW (ref 39.0–52.0)
Hemoglobin: 11.4 g/dL — ABNORMAL LOW (ref 13.0–17.0)
Immature Granulocytes: 0 %
Lymphocytes Relative: 21 %
Lymphs Abs: 1.5 10*3/uL (ref 0.7–4.0)
MCH: 33.1 pg (ref 26.0–34.0)
MCHC: 35.6 g/dL (ref 30.0–36.0)
MCV: 93 fL (ref 80.0–100.0)
Monocytes Absolute: 1 10*3/uL (ref 0.1–1.0)
Monocytes Relative: 14 %
Neutro Abs: 4.3 10*3/uL (ref 1.7–7.7)
Neutrophils Relative %: 59 %
Platelets: 198 10*3/uL (ref 150–400)
RBC: 3.44 MIL/uL — ABNORMAL LOW (ref 4.22–5.81)
RDW: 16.2 % — ABNORMAL HIGH (ref 11.5–15.5)
WBC: 7.2 10*3/uL (ref 4.0–10.5)
nRBC: 0 % (ref 0.0–0.2)

## 2022-08-04 MED ORDER — HEPARIN SOD (PORK) LOCK FLUSH 100 UNIT/ML IV SOLN
500.0000 [IU] | Freq: Once | INTRAVENOUS | Status: AC | PRN
  Administered 2022-08-04: 500 [IU]

## 2022-08-04 MED ORDER — SODIUM CHLORIDE 0.9 % IV SOLN
20.0000 mg | Freq: Once | INTRAVENOUS | Status: AC
  Administered 2022-08-04: 20 mg via INTRAVENOUS
  Filled 2022-08-04: qty 20

## 2022-08-04 MED ORDER — SODIUM CHLORIDE 0.9 % IV SOLN: INTRAVENOUS | Status: AC

## 2022-08-04 MED ORDER — SODIUM CHLORIDE 0.9% FLUSH
10.0000 mL | Freq: Once | INTRAVENOUS | Status: AC
  Administered 2022-08-04: 10 mL

## 2022-08-04 MED ORDER — SODIUM CHLORIDE 0.9 % IV SOLN: Freq: Once | INTRAVENOUS | Status: DC

## 2022-08-04 MED ORDER — ACETAMINOPHEN 325 MG PO TABS
650.0000 mg | ORAL_TABLET | Freq: Once | ORAL | Status: AC
  Administered 2022-08-04: 650 mg via ORAL
  Filled 2022-08-04: qty 2

## 2022-08-04 MED ORDER — SODIUM CHLORIDE 0.9 % IV SOLN: Freq: Once | INTRAVENOUS | Status: AC

## 2022-08-04 MED ORDER — DEXTROSE 5 % IV SOLN
56.0000 mg/m2 | Freq: Once | INTRAVENOUS | Status: AC
  Administered 2022-08-04: 100 mg via INTRAVENOUS
  Filled 2022-08-04: qty 30

## 2022-08-04 MED ORDER — SODIUM CHLORIDE 0.9% FLUSH
10.0000 mL | INTRAVENOUS | Status: DC | PRN
  Administered 2022-08-04: 10 mL

## 2022-08-04 MED ORDER — FAMOTIDINE 20 MG PO TABS
20.0000 mg | ORAL_TABLET | Freq: Once | ORAL | Status: AC
  Administered 2022-08-04: 20 mg via ORAL
  Filled 2022-08-04: qty 1

## 2022-08-04 NOTE — Patient Instructions (Signed)
[facility] MEDICAL ONCOLOGY  Discharge Instructions: Thank you for choosing [facility] to provide your oncology and hematology care.   If you have a lab appointment with the Cancer Center, please go directly to the Cancer Center and check in at the registration area.   Wear comfortable clothing and clothing appropriate for easy access to any Portacath or PICC line.   We strive to give you quality time with your provider. You may need to reschedule your appointment if you arrive late (15 or more minutes).  Arriving late affects you and other patients whose appointments are after yours.  Also, if you miss three or more appointments without notifying the office, you may be dismissed from the clinic at the provider's discretion.      For prescription refill requests, have your pharmacy contact our office and allow 72 hours for refills to be completed.    Today you received the following chemotherapy and/or immunotherapy agents kyprolis      To help prevent nausea and vomiting after your treatment, we encourage you to take your nausea medication as directed.  BELOW ARE SYMPTOMS THAT SHOULD BE REPORTED IMMEDIATELY: *FEVER GREATER THAN 100.4 F (38 C) OR HIGHER *CHILLS OR SWEATING *NAUSEA AND VOMITING THAT IS NOT CONTROLLED WITH YOUR NAUSEA MEDICATION *UNUSUAL SHORTNESS OF BREATH *UNUSUAL BRUISING OR BLEEDING *URINARY PROBLEMS (pain or burning when urinating, or frequent urination) *BOWEL PROBLEMS (unusual diarrhea, constipation, pain near the anus) TENDERNESS IN MOUTH AND THROAT WITH OR WITHOUT PRESENCE OF ULCERS (sore throat, sores in mouth, or a toothache) UNUSUAL RASH, SWELLING OR PAIN  UNUSUAL VAGINAL DISCHARGE OR ITCHING   Items with * indicate a potential emergency and should be followed up as soon as possible or go to the Emergency Department if any problems should occur.  Please show the CHEMOTHERAPY ALERT CARD or IMMUNOTHERAPY ALERT CARD at check-in to  the Emergency Department and triage nurse.  Should you have questions after your visit or need to cancel or reschedule your appointment, please contact [facility] MEDICAL ONCOLOGY  Dept: [phone]  and follow the prompts.  Office hours are 8:00 a.m. to 4:30 p.m. Monday - Friday. Please note that voicemails left after 4:00 p.m. may not be returned until the following business day.  We are closed weekends and major holidays. You have access to a nurse at all times for urgent questions. Please call the main number to the clinic Dept: [phone] and follow the prompts.   For any non-urgent questions, you may also contact your provider using MyChart. We now offer e-Visits for anyone 18 and older to request care online for non-urgent symptoms. For details visit [email or web address].   Also download the MyChart app! Go to the app store, search "MyChart", open the app, select [facility], and log in with your MyChart username and password.  Masks are optional in the cancer centers. If you would like for your care team to wear a mask while they are taking care of you, please let them know. You may have one support person who is at least 65 years old accompany you for your appointments. 

## 2022-08-04 NOTE — Progress Notes (Signed)
Per Dr. Irene Limbo, ok to treat with SCR 1.80

## 2022-08-08 ENCOUNTER — Other Ambulatory Visit: Payer: Self-pay

## 2022-08-08 DIAGNOSIS — C9010 Plasma cell leukemia not having achieved remission: Secondary | ICD-10-CM

## 2022-08-08 MED ORDER — LENALIDOMIDE 10 MG PO CAPS: 10.0000 mg | ORAL_CAPSULE | Freq: Every day | ORAL | 0 refills | Status: DC

## 2022-08-11 ENCOUNTER — Telehealth: Payer: Self-pay | Admitting: Pharmacy Technician

## 2022-08-11 ENCOUNTER — Other Ambulatory Visit (HOSPITAL_COMMUNITY): Payer: Self-pay

## 2022-08-11 ENCOUNTER — Other Ambulatory Visit: Payer: Self-pay

## 2022-08-11 ENCOUNTER — Encounter: Payer: Self-pay | Admitting: Hematology

## 2022-08-11 NOTE — Telephone Encounter (Signed)
Oral Oncology Patient Advocate Encounter  After completing a benefits investigation, prior authorization for Lenalidomide is not required at this time through [facility].  Patient's copay is $3,355.04.     Andres Fernandez, CPhT-Adv Oncology Pharmacy Patient [place] Direct Number: [phone]  Fax: [phone]

## 2022-08-11 NOTE — Telephone Encounter (Signed)
Oral Oncology Patient Advocate Encounter  Was successful in securing patient a $12,000 grant from [employer] to provide copayment coverage for Lenalidomide.  This will keep the out of pocket expense at $0.     [employer] ID: [id number]  I have spoken with the patient.   The billing information is as follows and has been shared with [employer].    RxBin: [id number] PCN: [id number] Member ID: [id number] Group ID: [id number] Dates of Eligibility: 07/12/2022 through 07/12/2023  Fund:  Multiple Myeloma  Claire Medlin, CPhT-Adv Oncology Pharmacy Patient Advocate [facility] Direct Number: [phone]  Fax: [phone]     

## 2022-08-16 ENCOUNTER — Other Ambulatory Visit: Payer: Self-pay

## 2022-08-16 DIAGNOSIS — C9010 Plasma cell leukemia not having achieved remission: Secondary | ICD-10-CM

## 2022-08-18 ENCOUNTER — Inpatient Hospital Stay: Payer: Medicare Other

## 2022-08-18 ENCOUNTER — Inpatient Hospital Stay: Payer: Medicare Other | Attending: Hematology

## 2022-08-18 ENCOUNTER — Other Ambulatory Visit: Payer: Self-pay

## 2022-08-18 VITALS — BP 138/94 | HR 78 | Temp 98.5°F | Resp 18 | Wt 156.0 lb

## 2022-08-18 VITALS — BP 130/75 | HR 79 | Resp 18

## 2022-08-18 DIAGNOSIS — Z5111 Encounter for antineoplastic chemotherapy: Secondary | ICD-10-CM | POA: Insufficient documentation

## 2022-08-18 DIAGNOSIS — C9000 Multiple myeloma not having achieved remission: Secondary | ICD-10-CM | POA: Diagnosis not present

## 2022-08-18 DIAGNOSIS — Z7189 Other specified counseling: Secondary | ICD-10-CM

## 2022-08-18 DIAGNOSIS — C9010 Plasma cell leukemia not having achieved remission: Secondary | ICD-10-CM | POA: Insufficient documentation

## 2022-08-18 DIAGNOSIS — Z95828 Presence of other vascular implants and grafts: Secondary | ICD-10-CM

## 2022-08-18 DIAGNOSIS — N289 Disorder of kidney and ureter, unspecified: Secondary | ICD-10-CM | POA: Diagnosis not present

## 2022-08-18 DIAGNOSIS — M84551A Pathological fracture in neoplastic disease, right femur, initial encounter for fracture: Secondary | ICD-10-CM

## 2022-08-18 LAB — CBC WITH DIFFERENTIAL (CANCER CENTER ONLY)
Abs Immature Granulocytes: 0.02 10*3/uL (ref 0.00–0.07)
Basophils Absolute: 0.1 10*3/uL (ref 0.0–0.1)
Basophils Relative: 1 %
Eosinophils Absolute: 0.1 10*3/uL (ref 0.0–0.5)
Eosinophils Relative: 3 %
HCT: 32.2 % — ABNORMAL LOW (ref 39.0–52.0)
Hemoglobin: 10.9 g/dL — ABNORMAL LOW (ref 13.0–17.0)
Immature Granulocytes: 0 %
Lymphocytes Relative: 24 %
Lymphs Abs: 1.1 10*3/uL (ref 0.7–4.0)
MCH: 31.9 pg (ref 26.0–34.0)
MCHC: 33.9 g/dL (ref 30.0–36.0)
MCV: 94.2 fL (ref 80.0–100.0)
Monocytes Absolute: 0.5 10*3/uL (ref 0.1–1.0)
Monocytes Relative: 12 %
Neutro Abs: 2.6 10*3/uL (ref 1.7–7.7)
Neutrophils Relative %: 60 %
Platelet Count: 270 10*3/uL (ref 150–400)
RBC: 3.42 MIL/uL — ABNORMAL LOW (ref 4.22–5.81)
RDW: 15.2 % (ref 11.5–15.5)
WBC Count: 4.5 10*3/uL (ref 4.0–10.5)
nRBC: 0 % (ref 0.0–0.2)

## 2022-08-18 LAB — CMP (CANCER CENTER ONLY)
ALT: 11 U/L (ref 0–44)
AST: 12 U/L — ABNORMAL LOW (ref 15–41)
Albumin: 3.8 g/dL (ref 3.5–5.0)
Alkaline Phosphatase: 67 U/L (ref 38–126)
Anion gap: 8 (ref 5–15)
BUN: 25 mg/dL — ABNORMAL HIGH (ref 8–23)
CO2: 24 mmol/L (ref 22–32)
Calcium: 8.6 mg/dL — ABNORMAL LOW (ref 8.9–10.3)
Chloride: 109 mmol/L (ref 98–111)
Creatinine: 1.6 mg/dL — ABNORMAL HIGH (ref 0.61–1.24)
GFR, Estimated: 48 mL/min — ABNORMAL LOW (ref >60)
Glucose, Bld: 97 mg/dL (ref 70–99)
Potassium: 3.9 mmol/L (ref 3.5–5.1)
Sodium: 141 mmol/L (ref 135–145)
Total Bilirubin: 0.3 mg/dL (ref 0.3–1.2)
Total Protein: 7 g/dL (ref 6.5–8.1)

## 2022-08-18 MED ORDER — SODIUM CHLORIDE 0.9 % IV SOLN: Freq: Once | INTRAVENOUS | Status: AC

## 2022-08-18 MED ORDER — SODIUM CHLORIDE 0.9% FLUSH
10.0000 mL | INTRAVENOUS | Status: DC | PRN
  Administered 2022-08-18: 10 mL

## 2022-08-18 MED ORDER — SODIUM CHLORIDE 0.9% FLUSH
10.0000 mL | Freq: Once | INTRAVENOUS | Status: AC
  Administered 2022-08-18: 10 mL

## 2022-08-18 MED ORDER — HEPARIN SOD (PORK) LOCK FLUSH 100 UNIT/ML IV SOLN
500.0000 [IU] | Freq: Once | INTRAVENOUS | Status: AC | PRN
  Administered 2022-08-18: 500 [IU]

## 2022-08-18 MED ORDER — DEXTROSE 5 % IV SOLN
56.0000 mg/m2 | Freq: Once | INTRAVENOUS | Status: AC
  Administered 2022-08-18: 100 mg via INTRAVENOUS
  Filled 2022-08-18: qty 30

## 2022-08-18 MED ORDER — ACETAMINOPHEN 325 MG PO TABS
650.0000 mg | ORAL_TABLET | Freq: Once | ORAL | Status: AC
  Administered 2022-08-18: 650 mg via ORAL
  Filled 2022-08-18: qty 2

## 2022-08-18 MED ORDER — FAMOTIDINE 20 MG PO TABS
20.0000 mg | ORAL_TABLET | Freq: Once | ORAL | Status: AC
  Administered 2022-08-18: 20 mg via ORAL
  Filled 2022-08-18: qty 1

## 2022-08-18 MED ORDER — SODIUM CHLORIDE 0.9 % IV SOLN
20.0000 mg | Freq: Once | INTRAVENOUS | Status: AC
  Administered 2022-08-18: 20 mg via INTRAVENOUS
  Filled 2022-08-18: qty 20

## 2022-08-18 NOTE — Progress Notes (Signed)
Per Dr. Irene Limbo okay to proceed with tx with Scr of 1.60 today.

## 2022-08-18 NOTE — Patient Instructions (Signed)
[facility]  Discharge Instructions: Thank you for choosing [facility] to provide your oncology and hematology care.   If you have a lab appointment with the Cancer Center, please go directly to the Cancer Center and check in at the registration area.   Wear comfortable clothing and clothing appropriate for easy access to any Portacath or PICC line.   We strive to give you quality time with your provider. You may need to reschedule your appointment if you arrive late (15 or more minutes).  Arriving late affects you and other patients whose appointments are after yours.  Also, if you miss three or more appointments without notifying the office, you may be dismissed from the clinic at the provider's discretion.      For prescription refill requests, have your pharmacy contact our office and allow 72 hours for refills to be completed.    Today you received the following chemotherapy and/or immunotherapy agents: carfilzomib      To help prevent nausea and vomiting after your treatment, we encourage you to take your nausea medication as directed.  BELOW ARE SYMPTOMS THAT SHOULD BE REPORTED IMMEDIATELY: *FEVER GREATER THAN 100.4 F (38 C) OR HIGHER *CHILLS OR SWEATING *NAUSEA AND VOMITING THAT IS NOT CONTROLLED WITH YOUR NAUSEA MEDICATION *UNUSUAL SHORTNESS OF BREATH *UNUSUAL BRUISING OR BLEEDING *URINARY PROBLEMS (pain or burning when urinating, or frequent urination) *BOWEL PROBLEMS (unusual diarrhea, constipation, pain near the anus) TENDERNESS IN MOUTH AND THROAT WITH OR WITHOUT PRESENCE OF ULCERS (sore throat, sores in mouth, or a toothache) UNUSUAL RASH, SWELLING OR PAIN  UNUSUAL VAGINAL DISCHARGE OR ITCHING   Items with * indicate a potential emergency and should be followed up as soon as possible or go to the Emergency Department if any problems should occur.  Please show the CHEMOTHERAPY ALERT CARD or IMMUNOTHERAPY ALERT CARD at check-in  to the Emergency Department and triage nurse.  Should you have questions after your visit or need to cancel or reschedule your appointment, please contact [facility]  Dept: [phone]  and follow the prompts.  Office hours are 8:00 a.m. to 4:30 p.m. Monday - Friday. Please note that voicemails left after 4:00 p.m. may not be returned until the following business day.  We are closed weekends and major holidays. You have access to a nurse at all times for urgent questions. Please call the main number to the clinic Dept: [phone] and follow the prompts.   For any non-urgent questions, you may also contact your provider using MyChart. We now offer e-Visits for anyone 18 and older to request care online for non-urgent symptoms. For details visit [email or web address].   Also download the MyChart app! Go to the app store, search "MyChart", open the app, select [facility], and log in with your MyChart username and password.  Masks are optional in the cancer centers. If you would like for your care team to wear a mask while they are taking care of you, please let them know. You may have one support person who is at least 65 years old accompany you for your appointments. 

## 2022-08-21 LAB — KAPPA/LAMBDA LIGHT CHAINS
Kappa free light chain: 59.4 mg/L — ABNORMAL HIGH (ref 3.3–19.4)
Kappa, lambda light chain ratio: 1.55 (ref 0.26–1.65)
Lambda free light chains: 38.3 mg/L — ABNORMAL HIGH (ref 5.7–26.3)

## 2022-08-23 ENCOUNTER — Emergency Department (HOSPITAL_COMMUNITY): Payer: Medicare Other

## 2022-08-23 ENCOUNTER — Other Ambulatory Visit: Payer: Self-pay

## 2022-08-23 ENCOUNTER — Encounter (HOSPITAL_COMMUNITY): Payer: Self-pay

## 2022-08-23 ENCOUNTER — Encounter: Payer: Self-pay | Admitting: Hematology

## 2022-08-23 ENCOUNTER — Emergency Department (HOSPITAL_COMMUNITY)
Admission: EM | Admit: 2022-08-23 | Discharge: 2022-08-23 | Disposition: A | Discharge status: Home / Self Care | Payer: Medicare Other | Attending: Emergency Medicine | Admitting: Emergency Medicine

## 2022-08-23 DIAGNOSIS — F17200 Nicotine dependence, unspecified, uncomplicated: Secondary | ICD-10-CM | POA: Insufficient documentation

## 2022-08-23 DIAGNOSIS — Z20822 Contact with and (suspected) exposure to covid-19: Secondary | ICD-10-CM | POA: Insufficient documentation

## 2022-08-23 DIAGNOSIS — R000 Tachycardia, unspecified: Secondary | ICD-10-CM | POA: Insufficient documentation

## 2022-08-23 DIAGNOSIS — Z7982 Long term (current) use of aspirin: Secondary | ICD-10-CM | POA: Insufficient documentation

## 2022-08-23 DIAGNOSIS — R0602 Shortness of breath: Secondary | ICD-10-CM | POA: Diagnosis present

## 2022-08-23 LAB — COMPREHENSIVE METABOLIC PANEL
ALT: 14 U/L (ref 0–44)
AST: 12 U/L — ABNORMAL LOW (ref 15–41)
Albumin: 3.6 g/dL (ref 3.5–5.0)
Alkaline Phosphatase: 65 U/L (ref 38–126)
Anion gap: 8 (ref 5–15)
BUN: 24 mg/dL — ABNORMAL HIGH (ref 8–23)
CO2: 24 mmol/L (ref 22–32)
Calcium: 9.2 mg/dL (ref 8.9–10.3)
Chloride: 110 mmol/L (ref 98–111)
Creatinine, Ser: 1.65 mg/dL — ABNORMAL HIGH (ref 0.61–1.24)
GFR, Estimated: 46 mL/min — ABNORMAL LOW (ref >60)
Glucose, Bld: 71 mg/dL (ref 70–99)
Potassium: 4.1 mmol/L (ref 3.5–5.1)
Sodium: 142 mmol/L (ref 135–145)
Total Bilirubin: 0.5 mg/dL (ref 0.3–1.2)
Total Protein: 7.3 g/dL (ref 6.5–8.1)

## 2022-08-23 LAB — RESP PANEL BY RT-PCR (FLU A&B, COVID) ARPGX2
Influenza A by PCR: NEGATIVE
Influenza B by PCR: NEGATIVE
SARS Coronavirus 2 by RT PCR: NEGATIVE

## 2022-08-23 LAB — MULTIPLE MYELOMA PANEL, SERUM
Albumin SerPl Elph-Mcnc: 3.2 g/dL (ref 2.9–4.4)
Albumin/Glob SerPl: 1.1 (ref 0.7–1.7)
Alpha 1: 0.3 g/dL (ref 0.0–0.4)
Alpha2 Glob SerPl Elph-Mcnc: 1.2 g/dL — ABNORMAL HIGH (ref 0.4–1.0)
B-Globulin SerPl Elph-Mcnc: 0.9 g/dL (ref 0.7–1.3)
Gamma Glob SerPl Elph-Mcnc: 0.7 g/dL (ref 0.4–1.8)
Globulin, Total: 3.2 g/dL (ref 2.2–3.9)
IgA: 107 mg/dL (ref 61–437)
IgG (Immunoglobin G), Serum: 717 mg/dL (ref 603–1613)
IgM (Immunoglobulin M), Srm: 5 mg/dL — ABNORMAL LOW (ref 20–172)
Total Protein ELP: 6.4 g/dL (ref 6.0–8.5)

## 2022-08-23 LAB — CBC
HCT: 35.3 % — ABNORMAL LOW (ref 39.0–52.0)
Hemoglobin: 11.8 g/dL — ABNORMAL LOW (ref 13.0–17.0)
MCH: 32.1 pg (ref 26.0–34.0)
MCHC: 33.4 g/dL (ref 30.0–36.0)
MCV: 95.9 fL (ref 80.0–100.0)
Platelets: 177 10*3/uL (ref 150–400)
RBC: 3.68 MIL/uL — ABNORMAL LOW (ref 4.22–5.81)
RDW: 14.6 % (ref 11.5–15.5)
WBC: 10.1 10*3/uL (ref 4.0–10.5)
nRBC: 0 % (ref 0.0–0.2)

## 2022-08-23 MED ORDER — SODIUM CHLORIDE (PF) 0.9 % IJ SOLN
INTRAMUSCULAR | Status: AC
  Filled 2022-08-23: qty 50

## 2022-08-23 MED ORDER — DOXYCYCLINE HYCLATE 100 MG PO CAPS: 100.0000 mg | ORAL_CAPSULE | Freq: Two times a day (BID) | ORAL | 0 refills | Status: AC

## 2022-08-23 MED ORDER — SODIUM CHLORIDE 0.9 % IV BOLUS
1000.0000 mL | Freq: Once | INTRAVENOUS | Status: AC
  Administered 2022-08-23: 1000 mL via INTRAVENOUS

## 2022-08-23 MED ORDER — SODIUM CHLORIDE 0.9 % IV SOLN
1.0000 g | Freq: Once | INTRAVENOUS | Status: AC
  Administered 2022-08-23: 1 g via INTRAVENOUS
  Filled 2022-08-23: qty 10

## 2022-08-23 MED ORDER — ALBUTEROL SULFATE HFA 108 (90 BASE) MCG/ACT IN AERS
2.0000 | INHALATION_SPRAY | Freq: Four times a day (QID) | RESPIRATORY_TRACT | Status: DC
  Administered 2022-08-23: 2 via RESPIRATORY_TRACT
  Filled 2022-08-23: qty 6.7

## 2022-08-23 MED ORDER — PREDNISONE 20 MG PO TABS: 40.0000 mg | ORAL_TABLET | Freq: Every day | ORAL | 0 refills | Status: DC

## 2022-08-23 MED ORDER — IOHEXOL 350 MG/ML SOLN
75.0000 mL | Freq: Once | INTRAVENOUS | Status: AC | PRN
  Administered 2022-08-23: 75 mL via INTRAVENOUS

## 2022-08-23 MED ORDER — ALBUTEROL SULFATE (2.5 MG/3ML) 0.083% IN NEBU
5.0000 mg | INHALATION_SOLUTION | Freq: Once | RESPIRATORY_TRACT | Status: AC
  Administered 2022-08-23: 5 mg via RESPIRATORY_TRACT
  Filled 2022-08-23: qty 6

## 2022-08-23 MED ORDER — DEXAMETHASONE SODIUM PHOSPHATE 10 MG/ML IJ SOLN
10.0000 mg | Freq: Once | INTRAMUSCULAR | Status: AC
  Administered 2022-08-23: 10 mg via INTRAVENOUS
  Filled 2022-08-23: qty 1

## 2022-08-23 MED ORDER — IPRATROPIUM-ALBUTEROL 0.5-2.5 (3) MG/3ML IN SOLN
3.0000 mL | Freq: Once | RESPIRATORY_TRACT | Status: AC
  Administered 2022-08-23: 3 mL via RESPIRATORY_TRACT
  Filled 2022-08-23: qty 3

## 2022-08-23 MED ORDER — CEPHALEXIN 500 MG PO CAPS: 500.0000 mg | ORAL_CAPSULE | Freq: Two times a day (BID) | ORAL | 0 refills | Status: AC

## 2022-08-23 MED ORDER — DOXYCYCLINE HYCLATE 100 MG PO TABS
100.0000 mg | ORAL_TABLET | Freq: Once | ORAL | Status: AC
  Administered 2022-08-23: 100 mg via ORAL
  Filled 2022-08-23: qty 1

## 2022-08-23 NOTE — Discharge Instructions (Addendum)
As discussed, it is very important to follow-up with your physician.  Today's evaluation suggests that your breathing difficulties likely accommodation of your history of smoking and your ongoing cancer therapy combined with recent pneumonia.  Please take all medication as directed and use the provided inhaler every 4 hours as needed for additional relief.  Do not hesitate to return here for concerning changes in your condition.

## 2022-08-23 NOTE — ED Triage Notes (Signed)
Pt here with reports of shob X2 weeks. Seen at [facility] for same with possible PNA to R lung. Pt last chemo 9/8. Endorses mild CP.

## 2022-08-23 NOTE — ED Notes (Signed)
Pt ambulated to restroom, stated he "felt short of breath".

## 2022-08-23 NOTE — ED Provider Notes (Signed)
[facility] DEPT Provider Note   CSN: [id number] Arrival date & time: 08/23/22  0809     History  Chief Complaint  Patient presents with   Shortness of Breath    Andres Fernandez is a 66 y.o. male.  HPI Patient presents with dyspnea.  Onset was 2 weeks ago.  This occurs in the context of ongoing therapy for leukemia.  Last chemotherapy was 6 days ago.  Over the interval 3 weeks the patient has been seen in urgent care 3 times, and he is on antibiotics, saw them again yesterday was started on new antibiotic, but he has persistent symptoms.  No fever, no vomiting, no pain, contrary to initial nursing note.    Home Medications Prior to Admission medications   Medication Sig Start Date End Date Taking? Authorizing Provider  cephALEXin (KEFLEX) 500 MG capsule Take 1 capsule (500 mg total) by mouth 2 (two) times daily for 5 days. 08/23/22 08/28/22 Yes Carmin Muskrat, MD  doxycycline (VIBRAMYCIN) 100 MG capsule Take 1 capsule (100 mg total) by mouth 2 (two) times daily for 7 days. 08/23/22 08/30/22 Yes Carmin Muskrat, MD  predniSONE (DELTASONE) 20 MG tablet Take 2 tablets (40 mg total) by mouth daily with breakfast. For the next four days 08/23/22  Yes Carmin Muskrat, MD  acyclovir (ZOVIRAX) 400 MG tablet Take 1 tablet (400 mg total) by mouth daily. 08/05/21   Brunetta Genera, MD  Albuterol Sulfate (PROAIR [place]) 709 (90 Base) MCG/ACT AEPB Inhale into the lungs.    [provider]  amoxicillin-clavulanate (AUGMENTIN) 875-125 MG tablet Take 1 tablet by mouth 2 (two) times daily. 08/18/22   [provider]  aspirin EC 81 MG tablet Take 1 tablet (81 mg total) by mouth daily. Patient taking differently: Take 81 mg by mouth at bedtime. 11/28/19   Brunetta Genera, MD  cholecalciferol (VITAMIN D) 400 units TABS tablet Take 400 Units by mouth at bedtime.     [provider]  FLOVENT DISKUS 250 MCG/ACT AEPB Inhale into the lungs.  02/02/22   [provider]  ipratropium-albuterol (DUONEB) 0.5-2.5 (3) MG/3ML SOLN Inhale into the lungs. 01/31/22   [provider]  lenalidomide (REVLIMID) 10 MG capsule Take 1 capsule (10 mg total) by mouth daily. Take for 21 days on, 7 days off, repeat every 28 days. 08/08/22   Brunetta Genera, MD  lidocaine-prilocaine (EMLA) cream Apply 1 application topically as needed. Apply to Port-A-Cath at least 1 hr prior to treatment. 09/30/21   Brunetta Genera, MD  Multiple Vitamins-Minerals (MULTIVITAMIN WITH MINERALS) tablet Take 1 tablet by mouth at bedtime.     [provider]  nicotine (NICODERM CQ - DOSED IN MG/24 HOURS) 14 mg/24hr patch PLACE 1 PATCH (14 MG TOTAL) ONTO THE SKIN DAILY. 09/30/21   Brunetta Genera, MD  ondansetron (ZOFRAN) 4 MG tablet Take 2 tablets (8 mg total) by mouth every 8 (eight) hours as needed for nausea. 08/05/21   Brunetta Genera, MD  Vitamin D, Ergocalciferol, (DRISDOL) 1.25 MG (50000 UNIT) CAPS capsule Take 1 capsule (50,000 Units total) by mouth once a week. 08/05/21   Brunetta Genera, MD      Allergies    Patient has no known allergies.    Review of Systems   Review of Systems  All other systems reviewed and are negative.   Physical Exam Updated Vital Signs BP (!) 154/97   Pulse (!) 101   Temp 98.5 F (36.9 C) (  Oral)   Resp (!) 29   SpO2 97%  Physical Exam Vitals and nursing note reviewed.  Constitutional:      General: He is not in acute distress.    Appearance: He is well-developed.  HENT:     Head: Normocephalic and atraumatic.  Eyes:     Conjunctiva/sclera: Conjunctivae normal.  Cardiovascular:     Rate and Rhythm: Normal rate and regular rhythm.  Pulmonary:     Effort: Pulmonary effort is normal. Tachypnea present. No respiratory distress.     Breath sounds: No stridor. Wheezing present.  Abdominal:     General: There is no distension.  Skin:    General: Skin is warm and dry.  Neurological:      Mental Status: He is alert and oriented to person, place, and time.     ED Results / Procedures / Treatments   Labs (all labs ordered are listed, but only abnormal results are displayed) Labs Reviewed  COMPREHENSIVE METABOLIC PANEL - Abnormal; Notable for the following components:      Result Value   BUN 24 (*)    Creatinine, Ser 1.65 (*)    AST 12 (*)    GFR, Estimated 46 (*)    All other components within normal limits  CBC - Abnormal; Notable for the following components:   RBC 3.68 (*)    Hemoglobin 11.8 (*)    HCT 35.3 (*)    All other components within normal limits  RESP PANEL BY RT-PCR (FLU A&B, COVID) [id number]    EKG None  Radiology CT Angio Chest PE W and/or Wo Contrast  Result Date: 08/23/2022 CLINICAL DATA:  Pulmonary embolism suspected.  High probability. EXAM: CT ANGIOGRAPHY CHEST WITH CONTRAST TECHNIQUE: Multidetector CT imaging of the chest was performed using the standard protocol during bolus administration of intravenous contrast. Multiplanar CT image reconstructions and MIPs were obtained to evaluate the vascular anatomy. RADIATION DOSE REDUCTION: This exam was performed according to the departmental dose-optimization program which includes automated exposure control, adjustment of the mA and/or kV according to patient size and/or use of iterative reconstruction technique. CONTRAST:  77m OMNIPAQUE IOHEXOL 350 MG/ML SOLN COMPARISON:  Chest CT without contrast 12/18/2018 FINDINGS: Cardiovascular: The heart size is normal. No substantial pericardial effusion. Mild atherosclerotic calcification is noted in the wall of the thoracic aorta. There is no filling defect within the opacified pulmonary arteries to suggest the presence of an acute pulmonary embolus. Mediastinum/Nodes: No mediastinal lymphadenopathy. There is no hilar lymphadenopathy. The esophagus has normal imaging features. There is no axillary lymphadenopathy. Lungs/Pleura: Centrilobular and paraseptal  emphysema evident. 1.4 cm right lower lobe pulmonary nodule on image 102/10 is new in the interval. There is bronchial wall thickening with associated airway impaction and ill-defined tree-in-bud nodularity in the right lower lobe, in the region of the 1.4 cm nodule. Similar but less prominent changes are seen in the right middle lobe and posterior left costophrenic sulcus. Fine architectural detail of the lower lungs bilaterally is obscured by breathing motion. Upper Abdomen: Unremarkable. Musculoskeletal: No worrisome lytic or sclerotic osseous abnormality. Review of the MIP images confirms the above findings. IMPRESSION: 1. No CT evidence for acute pulmonary embolus. 2. 1.4 cm right lower lobe pulmonary nodule in a region of associated bronchial wall thickening, airway impaction and ill-defined tree-in-bud nodularity. Findings suggest sequelae of infection (including atypical etiology) and/or aspiration. The 1.4 cm nodule may be a component of this infectious/inflammatory disease, but superimposed neoplasm cannot be excluded. Close continued follow-up will be  required. Consider repeat CT chest in 3 months after therapy to re-evaluate. 3. Clustered nodular opacities in the posterior left costophrenic sulcus may also be infectious/inflammatory but are not well visualized due to substantial motion artifact. Attention on follow-up recommended. 4. Aortic Atherosclerosis (ICD10-I70.0) and Emphysema (ICD10-J43.9). Electronically Signed   By: Misty Stanley M.D.   On: 08/23/2022 10:11   DG Chest Port 1 View  Result Date: 08/23/2022 CLINICAL DATA:  Shortness of breath. EXAM: PORTABLE CHEST 1 VIEW COMPARISON:  March 17, 2022. FINDINGS: Stable cardiomediastinal silhouette. Right internal jugular Port-A-Cath is unchanged. Lungs are clear. Bony thorax is unremarkable. IMPRESSION: No active disease. Electronically Signed   By: Marijo Conception M.D.   On: 08/23/2022 08:34    Procedures Procedures    Medications Ordered  in ED Medications  sodium chloride (PF) 0.9 % injection (has no administration in time range)  albuterol (VENTOLIN HFA) 108 (90 Base) MCG/ACT inhaler 2 puff (has no administration in time range)  albuterol (PROVENTIL) (2.5 MG/3ML) 0.083% nebulizer solution 5 mg (5 mg Nebulization Given 08/23/22 0850)  sodium chloride 0.9 % bolus 1,000 mL (0 mLs Intravenous Stopped 08/23/22 1147)  iohexol (OMNIPAQUE) 350 MG/ML injection 75 mL (75 mLs Intravenous Contrast Given 08/23/22 0937)  dexamethasone (DECADRON) injection 10 mg (10 mg Intravenous Given 08/23/22 1147)  ipratropium-albuterol (DUONEB) 0.5-2.5 (3) MG/3ML nebulizer solution 3 mL (3 mLs Nebulization Given 08/23/22 1150)  cefTRIAXone (ROCEPHIN) 1 g in sodium chloride 0.9 % 100 mL IVPB (0 g Intravenous Stopped 08/23/22 1232)  doxycycline (VIBRA-TABS) tablet 100 mg (100 mg Oral Given 08/23/22 1147)    ED Course/ Medical Decision Making/ A&P This patient with a Hx of leukemia, possible outpatient diagnosis of pneumonia presents to the ED for concern of dyspnea, wheezing, this involves an extensive number of treatment options, and is a complaint that carries with it a high risk of complications and morbidity.    The differential diagnosis includes progression of disease, pneumonia, pulmonary embolism   Social Determinants of Health:  Malignancy, current smoker  Additional history obtained:  Additional history and/or information obtained from chart review, notable for oncology notes including chemotherapy from last week   After the initial evaluation, orders, including: CT angiography, x-ray, labs, albuterol, fluids were initiated.   Patient placed on Cardiac and Pulse-Oximetry Monitors. The patient was maintained on a cardiac monitor.  The cardiac monitored showed an rhythm of 75 sinus normal The patient was also maintained on pulse oximetry. The readings were typically 97% room air normal   On repeat evaluation of the patient improved After  multiple bronchodilators, steroids, the patient is not on home oxygen, respiratory rate has diminished to 20. Lab Tests:  I personally interpreted labs.  The pertinent results include: No leukocytosis, creatinine 1.65, down from recent elevations  Imaging Studies ordered:  I independently visualized and interpreted imaging which showed no pneumonia on x-ray, CT angiography with suggestion of inflammatory versus infectious sequelae I agree with the radiologist interpretation   Dispostion / Final MDM:  After consideration of the diagnostic results and the patient's response to treatment, this adult male with hematologic malignancy history of tobacco use presents with dyspnea, cough.  Patient is on amoxicillin, some suspicion for refractory pneumonia versus PE versus progression of disease.  No obvious new pneumonia on imaging CT or x-ray.  No evidence for bacteremia, sepsis with labs.  No new oxygen requirement, and though the patient was tachypneic on arrival, this is improved, as has his tachycardia.  Some suspicion for  mixed etiology COPD exacerbation and infection.  Patient is comfortable with" follow-up though we discussed admission, and this was a consideration.  Patient will follow-up with oncology, discharged with steroids, antibiotics, bronchodilators.  Final Clinical Impression(s) / ED Diagnoses Final diagnoses:  SOB (shortness of breath)    Rx / DC Orders ED Discharge Orders          Ordered    predniSONE (DELTASONE) 20 MG tablet  Daily with breakfast        08/23/22 1258    doxycycline (VIBRAMYCIN) 100 MG capsule  2 times daily        08/23/22 1258    cephALEXin (KEFLEX) 500 MG capsule  2 times daily        08/23/22 1258              Carmin Muskrat, MD 08/23/22 1259

## 2022-08-31 ENCOUNTER — Other Ambulatory Visit: Payer: Self-pay

## 2022-08-31 DIAGNOSIS — C9010 Plasma cell leukemia not having achieved remission: Secondary | ICD-10-CM

## 2022-08-31 DIAGNOSIS — C9000 Multiple myeloma not having achieved remission: Secondary | ICD-10-CM

## 2022-08-31 MED FILL — Dexamethasone Sodium Phosphate Inj 100 MG/10ML: INTRAMUSCULAR | Qty: 2 | Status: AC

## 2022-09-01 ENCOUNTER — Inpatient Hospital Stay: Payer: Medicare Other

## 2022-09-01 ENCOUNTER — Other Ambulatory Visit: Payer: Self-pay

## 2022-09-01 ENCOUNTER — Inpatient Hospital Stay (HOSPITAL_BASED_OUTPATIENT_CLINIC_OR_DEPARTMENT_OTHER): Payer: Medicare Other | Admitting: Hematology

## 2022-09-01 VITALS — BP 127/76 | HR 74 | Temp 97.9°F | Resp 15 | Wt 157.1 lb

## 2022-09-01 DIAGNOSIS — Z95828 Presence of other vascular implants and grafts: Secondary | ICD-10-CM

## 2022-09-01 DIAGNOSIS — C9010 Plasma cell leukemia not having achieved remission: Secondary | ICD-10-CM | POA: Diagnosis not present

## 2022-09-01 DIAGNOSIS — C9000 Multiple myeloma not having achieved remission: Secondary | ICD-10-CM

## 2022-09-01 DIAGNOSIS — Z5111 Encounter for antineoplastic chemotherapy: Secondary | ICD-10-CM | POA: Diagnosis not present

## 2022-09-01 DIAGNOSIS — Z7189 Other specified counseling: Secondary | ICD-10-CM

## 2022-09-01 DIAGNOSIS — M84551A Pathological fracture in neoplastic disease, right femur, initial encounter for fracture: Secondary | ICD-10-CM

## 2022-09-01 LAB — CBC WITH DIFFERENTIAL (CANCER CENTER ONLY)
Abs Immature Granulocytes: 0.06 10*3/uL (ref 0.00–0.07)
Basophils Absolute: 0.1 10*3/uL (ref 0.0–0.1)
Basophils Relative: 1 %
Eosinophils Absolute: 0.5 10*3/uL (ref 0.0–0.5)
Eosinophils Relative: 6 %
HCT: 31.4 % — ABNORMAL LOW (ref 39.0–52.0)
Hemoglobin: 10.7 g/dL — ABNORMAL LOW (ref 13.0–17.0)
Immature Granulocytes: 1 %
Lymphocytes Relative: 21 %
Lymphs Abs: 1.6 10*3/uL (ref 0.7–4.0)
MCH: 32.1 pg (ref 26.0–34.0)
MCHC: 34.1 g/dL (ref 30.0–36.0)
MCV: 94.3 fL (ref 80.0–100.0)
Monocytes Absolute: 1.4 10*3/uL — ABNORMAL HIGH (ref 0.1–1.0)
Monocytes Relative: 18 %
Neutro Abs: 4.2 10*3/uL (ref 1.7–7.7)
Neutrophils Relative %: 53 %
Platelet Count: 253 10*3/uL (ref 150–400)
RBC: 3.33 MIL/uL — ABNORMAL LOW (ref 4.22–5.81)
RDW: 15.8 % — ABNORMAL HIGH (ref 11.5–15.5)
WBC Count: 7.9 10*3/uL (ref 4.0–10.5)
nRBC: 0 % (ref 0.0–0.2)

## 2022-09-01 LAB — CMP (CANCER CENTER ONLY)
ALT: 9 U/L (ref 0–44)
AST: 7 U/L — ABNORMAL LOW (ref 15–41)
Albumin: 3.6 g/dL (ref 3.5–5.0)
Alkaline Phosphatase: 65 U/L (ref 38–126)
Anion gap: 5 (ref 5–15)
BUN: 21 mg/dL (ref 8–23)
CO2: 26 mmol/L (ref 22–32)
Calcium: 8.5 mg/dL — ABNORMAL LOW (ref 8.9–10.3)
Chloride: 111 mmol/L (ref 98–111)
Creatinine: 1.41 mg/dL — ABNORMAL HIGH (ref 0.61–1.24)
GFR, Estimated: 55 mL/min — ABNORMAL LOW (ref >60)
Glucose, Bld: 91 mg/dL (ref 70–99)
Potassium: 3.9 mmol/L (ref 3.5–5.1)
Sodium: 142 mmol/L (ref 135–145)
Total Bilirubin: 0.4 mg/dL (ref 0.3–1.2)
Total Protein: 6.1 g/dL — ABNORMAL LOW (ref 6.5–8.1)

## 2022-09-01 MED ORDER — SODIUM CHLORIDE 0.9% FLUSH
10.0000 mL | Freq: Once | INTRAVENOUS | Status: AC
  Administered 2022-09-01: 10 mL

## 2022-09-01 MED ORDER — SODIUM CHLORIDE 0.9% FLUSH: 3.0000 mL | Freq: Once | INTRAVENOUS | Status: DC | PRN

## 2022-09-01 MED ORDER — VITAMIN D (ERGOCALCIFEROL) 1.25 MG (50000 UNIT) PO CAPS: 50000.0000 [IU] | ORAL_CAPSULE | ORAL | 6 refills | Status: DC

## 2022-09-01 MED ORDER — HEPARIN SOD (PORK) LOCK FLUSH 100 UNIT/ML IV SOLN
500.0000 [IU] | Freq: Once | INTRAVENOUS | Status: AC | PRN
  Administered 2022-09-01: 500 [IU]

## 2022-09-01 MED ORDER — CHOLECALCIFEROL 10 MCG (400 UNIT) PO TABS: 400.0000 [IU] | ORAL_TABLET | Freq: Every day | ORAL | 3 refills | Status: DC

## 2022-09-01 NOTE — Addendum Note (Signed)
Addended by: Ishmael Holter on: 09/01/2022 01:05 PM   Modules accepted: Orders

## 2022-09-04 ENCOUNTER — Other Ambulatory Visit: Payer: Self-pay

## 2022-09-04 DIAGNOSIS — C9010 Plasma cell leukemia not having achieved remission: Secondary | ICD-10-CM

## 2022-09-04 MED ORDER — LENALIDOMIDE 10 MG PO CAPS: 10.0000 mg | ORAL_CAPSULE | Freq: Every day | ORAL | 0 refills | Status: DC

## 2022-09-07 NOTE — Progress Notes (Signed)
HEMATOLOGY/ONCOLOGY CLINIC NOTE  Date of Service: 09/01/2022  CC Follow-up continued evaluation and management of plasma cell leukemia/multiple myeloma  HISTORY OF PRESENTING ILLNESS:  Please see previous note for details of initial presentation.  INTERVAL HISTORY:  Andres Fernandez is here for continued evaluation and management of plasma cell leukemia/multiple myeloma for continued aggressive maintenance therapy with carfilzomib and Revlimid. He reports He is doing well with no new symptoms or concerns.  He notes recent ED visit 08/23/2022 where he was seen for SOB and dyspnea and a CT angio was done for further evaluation which revealed no overt evidence for acute PE, 1.4 cm lower lobe pulmonary nodule which is suspicious for infection and/or aspiration. Clustered nodular opacities in the posterior left costophrenic sulcus. He was treated with amoxicillin, steroids, and bronchodilators. He notes he feels much better today and does not note any overt SOB or issues breathing.  No fever, chills, or night sweats. No lumps, bumps, or rashes/lesions. No concerns for shingles. No new dental issues. No new infection issues. No new focal bone pains. No other new or acute focal symptoms.  Other notable toxicities from current treatment regimen.  Labs done today were reviewed with him in detail  REVIEW OF SYSTEMS:   10 Point review of Systems was done is negative except as noted above.  MEDICAL HISTORY:  Past Medical History:  Diagnosis Date   BPH (benign prostatic hyperplasia)    Hepatitis C 11/27/2017   Hypertension    Plasma cell leukemia ([place]) 11/23/2017   Tobacco abuse     SURGICAL HISTORY: Past Surgical History:  Procedure Laterality Date   APPENDECTOMY     FEMUR IM NAIL Right 11/20/2017   Procedure: RIGHT FEMORAL INTRAMEDULLARY (IM) NAIL;  Surgeon: Nicholes Stairs, MD;  Location: [facility];  Service: Orthopedics;  Laterality: Right;   IR IMAGING GUIDED PORT  INSERTION  08/19/2021    SOCIAL HISTORY: Social History   Socioeconomic History   Marital status: Single    Spouse name: Not on file   Number of children: Not on file   Years of education: Not on file   Highest education level: Not on file  Occupational History   Not on file  Tobacco Use   Smoking status: Every Day    Packs/day: 0.25    Types: Cigarettes    Last attempt to quit: 11/09/2017    Years since quitting: 4.8   Smokeless tobacco: Never   Tobacco comments:    1 cigarette/day as of 09/25/18  Vaping Use   Vaping Use: Never used  Substance and Sexual Activity   Alcohol use: Yes   Drug use: No   Sexual activity: Not on file  Other Topics Concern   Not on file  Social History Narrative   Not on file   Social Determinants of Health   Financial Resource Strain: Not on file  Food Insecurity: Not on file  Transportation Needs: Not on file  Physical Activity: Not on file  Stress: Not on file  Social Connections: Not on file  Intimate Partner Violence: Not on file    FAMILY HISTORY: Family History  Problem Relation Age of Onset   COPD Mother    Liver cancer Mother     ALLERGIES:  has No Known Allergies.  MEDICATIONS:  . Current Outpatient Medications on File Prior to Visit  Medication Sig Dispense Refill   acyclovir (ZOVIRAX) 400 MG tablet Take 1 tablet (400 mg total) by mouth daily. 30 tablet 11  aspirin EC 81 MG tablet Take 1 tablet (81 mg total) by mouth daily. (Patient taking differently: Take 81 mg by mouth at bedtime.) 60 tablet 11   lidocaine-prilocaine (EMLA) cream Apply 1 application topically as needed. Apply to Port-A-Cath at least 1 hr prior to treatment. 30 g 0   Multiple Vitamins-Minerals (MULTIVITAMIN WITH MINERALS) tablet Take 1 tablet by mouth at bedtime.      nicotine (NICODERM CQ - DOSED IN MG/24 HOURS) 14 mg/24hr patch PLACE 1 PATCH (14 MG TOTAL) ONTO THE SKIN DAILY. 28 patch 1   Albuterol Sulfate (PROAIR RESPICLICK) 875 (90 Base) MCG/ACT  AEPB Inhale into the lungs. (Patient not taking: Reported on 09/01/2022)     amoxicillin-clavulanate (AUGMENTIN) 875-125 MG tablet Take 1 tablet by mouth 2 (two) times daily. (Patient not taking: Reported on 09/01/2022)     FLOVENT DISKUS 250 MCG/ACT AEPB Inhale into the lungs. (Patient not taking: Reported on 09/01/2022)     ipratropium-albuterol (DUONEB) 0.5-2.5 (3) MG/3ML SOLN Inhale into the lungs. (Patient not taking: Reported on 09/01/2022)     ondansetron (ZOFRAN) 4 MG tablet Take 2 tablets (8 mg total) by mouth every 8 (eight) hours as needed for nausea. (Patient not taking: Reported on 09/01/2022) 30 tablet 0   predniSONE (DELTASONE) 20 MG tablet Take 2 tablets (40 mg total) by mouth daily with breakfast. For the next four days (Patient not taking: Reported on 09/01/2022) 8 tablet 0   Current Facility-Administered Medications on File Prior to Visit  Medication Dose Route Frequency Provider Last Rate Last Admin   0.9 %  sodium chloride infusion   Intravenous Once Brunetta Genera, MD       acetaminophen (TYLENOL) 325 MG tablet            famotidine (PEPCID) 20 MG tablet            sodium chloride flush (NS) 0.9 % injection 10 mL  10 mL Intracatheter PRN Brunetta Genera, MD   10 mL at 09/16/21 1623    PHYSICAL EXAMINATION:  ECOG FS:1 - Symptomatic but completely ambulatory Vitals:   09/01/22 1126  BP: 127/76  Pulse: 74  Resp: 15  Temp: 97.9 F (36.6 C)  SpO2: 100%   Wt Readings from Last 3 Encounters:  09/01/22 157 lb 1.6 oz (71.3 kg)  08/18/22 156 lb (70.8 kg)  07/21/22 159 lb (72.1 kg)  NAD GENERAL:alert, in no acute distress and comfortable SKIN: no acute rashes, no significant lesions EYES: conjunctiva are pink and non-injected, sclera anicteric NECK: supple, no JVD LYMPH:  no palpable lymphadenopathy in the cervical, axillary or inguinal regions LUNGS: clear to auscultation b/l with normal respiratory effort HEART: regular rate & rhythm ABDOMEN:  normoactive bowel  sounds , non tender, not distended. Extremity: no pedal edema PSYCH: alert & oriented x 3 with fluent speech NEURO: no focal motor/sensory deficits  LABORATORY DATA:  I have reviewed the data as listed     Latest Ref Rng & Units 09/01/2022   10:23 AM 08/23/2022    8:35 AM 08/18/2022   10:48 AM  CBC  WBC 4.0 - 10.5 K/uL 7.9  10.1  4.5   Hemoglobin 13.0 - 17.0 g/dL 10.7  11.8  10.9   Hematocrit 39.0 - 52.0 % 31.4  35.3  32.2   Platelets 150 - 400 K/uL 253  177  270    CBC    Component Value Date/Time   WBC 7.9 09/01/2022 1023   WBC 10.1 08/23/2022 0835  RBC 3.33 (L) 09/01/2022 1023   HGB 10.7 (L) 09/01/2022 1023   HGB 8.3 (L) 12/14/2017 1230   HCT 31.4 (L) 09/01/2022 1023   HCT 23.9 (L) 12/14/2017 1230   PLT 253 09/01/2022 1023   PLT 524 (H) 12/14/2017 1230   MCV 94.3 09/01/2022 1023   MCV 88.4 12/14/2017 1230   MCH 32.1 09/01/2022 1023   MCHC 34.1 09/01/2022 1023   RDW 15.8 (H) 09/01/2022 1023   RDW 15.3 (H) 12/14/2017 1230   LYMPHSABS 1.6 09/01/2022 1023   LYMPHSABS 1.4 12/14/2017 1230   MONOABS 1.4 (H) 09/01/2022 1023   MONOABS 0.6 12/14/2017 1230   EOSABS 0.5 09/01/2022 1023   EOSABS 0.1 12/14/2017 1230   BASOSABS 0.1 09/01/2022 1023   BASOSABS 0.1 12/14/2017 1230        Latest Ref Rng & Units 09/01/2022   10:23 AM 08/23/2022    8:35 AM 08/18/2022   10:48 AM  CMP  Glucose 70 - 99 mg/dL 91  71  97   BUN 8 - 23 mg/dL 21  24  25    Creatinine 0.61 - 1.24 mg/dL 1.41  1.65  1.60   Sodium 135 - 145 mmol/L 142  142  141   Potassium 3.5 - 5.1 mmol/L 3.9  4.1  3.9   Chloride 98 - 111 mmol/L 111  110  109   CO2 22 - 32 mmol/L 26  24  24    Calcium 8.9 - 10.3 mg/dL 8.5  9.2  8.6   Total Protein 6.5 - 8.1 g/dL 6.1  7.3  7.0   Total Bilirubin 0.3 - 1.2 mg/dL 0.4  0.5  0.3   Alkaline Phos 38 - 126 U/L 65  65  67   AST 15 - 41 U/L 7  12  12    ALT 0 - 44 U/L 9  14  11        03/09/20 ([id number]) Bone Marrow Biopsy at [facility]   07/02/2019 Bone Marrow Biopsy  ([id number]) at [facility]    08/07/18 BM Bx:        RADIOGRAPHIC STUDIES: I have personally reviewed the radiological images as listed and agreed with the findings in the report. CT Angio Chest PE W and/or Wo Contrast  Result Date: 08/23/2022 CLINICAL DATA:  Pulmonary embolism suspected.  High probability. EXAM: CT ANGIOGRAPHY CHEST WITH CONTRAST TECHNIQUE: Multidetector CT imaging of the chest was performed using the standard protocol during bolus administration of intravenous contrast. Multiplanar CT image reconstructions and MIPs were obtained to evaluate the vascular anatomy. RADIATION DOSE REDUCTION: This exam was performed according to the departmental dose-optimization program which includes automated exposure control, adjustment of the mA and/or kV according to patient size and/or use of iterative reconstruction technique. CONTRAST:  8m OMNIPAQUE IOHEXOL 350 MG/ML SOLN COMPARISON:  Chest CT without contrast 12/18/2018 FINDINGS: Cardiovascular: The heart size is normal. No substantial pericardial effusion. Mild atherosclerotic calcification is noted in the wall of the thoracic aorta. There is no filling defect within the opacified pulmonary arteries to suggest the presence of an acute pulmonary embolus. Mediastinum/Nodes: No mediastinal lymphadenopathy. There is no hilar lymphadenopathy. The esophagus has normal imaging features. There is no axillary lymphadenopathy. Lungs/Pleura: Centrilobular and paraseptal emphysema evident. 1.4 cm right lower lobe pulmonary nodule on image 102/10 is new in the interval. There is bronchial wall thickening with associated airway impaction and ill-defined tree-in-bud nodularity in the right lower lobe, in the region of the 1.4 cm nodule. Similar but less  prominent changes are seen in the right middle lobe and posterior left costophrenic sulcus. Fine architectural detail of the lower lungs bilaterally is obscured by breathing motion. Upper Abdomen:  Unremarkable. Musculoskeletal: No worrisome lytic or sclerotic osseous abnormality. Review of the MIP images confirms the above findings. IMPRESSION: 1. No CT evidence for acute pulmonary embolus. 2. 1.4 cm right lower lobe pulmonary nodule in a region of associated bronchial wall thickening, airway impaction and ill-defined tree-in-bud nodularity. Findings suggest sequelae of infection (including atypical etiology) and/or aspiration. The 1.4 cm nodule may be a component of this infectious/inflammatory disease, but superimposed neoplasm cannot be excluded. Close continued follow-up will be required. Consider repeat CT chest in 3 months after therapy to re-evaluate. 3. Clustered nodular opacities in the posterior left costophrenic sulcus may also be infectious/inflammatory but are not well visualized due to substantial motion artifact. Attention on follow-up recommended. 4. Aortic Atherosclerosis (ICD10-I70.0) and Emphysema (ICD10-J43.9). Electronically Signed   By: Misty Stanley M.D.   On: 08/23/2022 10:11   DG Chest Port 1 View  Result Date: 08/23/2022 CLINICAL DATA:  Shortness of breath. EXAM: PORTABLE CHEST 1 VIEW COMPARISON:  March 17, 2022. FINDINGS: Stable cardiomediastinal silhouette. Right internal jugular Port-A-Cath is unchanged. Lungs are clear. Bony thorax is unremarkable. IMPRESSION: No active disease. Electronically Signed   By: Marijo Conception M.D.   On: 08/23/2022 08:34    ASSESSMENT & PLAN:    66 y.o. 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   Initial BM Bx showed >95% involvement with kappa restricted serum free light chains. 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 MoL Cy-   08/07/18 BM  Bx revealed normocellular bone marrow with trilineage hematopoiesis and 1% plasma cells which indicates the pt is in remission 08/06/18 PET/CT revealed  Expansile lytic lesion involving the left iliac bone, unchanged. Lytic lesion involving the sternum, unchanged. While both are suspicious for myeloma, neither lesion is FDG avid. No hypermetabolic osseous lesions in the visualized axial and appendicular skeleton. Please note that the bilateral lower extremities were not imaged. No suspicious lymphadenopathy. Spleen is normal in size.    Pt began Ninlaro 23m maintenance after pt finished 13 cycles of CyBorD and BM bx showed only 1% plasma cells, then resumed CyBorD and held Ninlaro in anticipation of his tandem bone marrow transplants beginning in late December 2019 [facility]  11/19/18 High dose chemotherapy with Melphalan 1436mm2 and autologous stem cell rescue  03/20/19 High dose chemotherapy with Melphalan 20063m2 and autologous stem cell rescue. Non-infectious diarrhea, uncomplicated course otherwise.  07/02/2019 PET scan with results revealing "1. No FDG avid bone lesions are identified. 2.  Asymmetric uptake within the right, greater than left, palatine tonsils with mild fullness of the right tonsillar soft tissue. ENT consult vs attention on follow up suggested. 3.  Innumerable mixed sclerotic and lytic lesions throughout the axial and appendicular skeleton without associated hypermetabolic activity. 4.  Ancillary CT findings as above."  07/02/2019 bone marrow biopsy ([id number])with results showing: "Trilineage hematopoieses with maturation. No plasma cell myeloma identified. Mild anemia and No Rouleaux or circulating plasma cells identified in the peripheral blood."  2) s/p Prophylactic IM nailing for rt femur  completed Physical Therapy at  the [facility].  -patient previously reported improvement and is currently progressively back to full time work.  3)  Thrombocytopenia resolved   4) Renal insufficiency --likely related to multiple myeloma.  Remained fairly stable with baseline creatinine of 1.5-2.1.  Creatinine is 1.6 today Plan -maintain follow up with nephrology referral to Dr Marval Regal [facility] for continued treatment -Pt completed 8 weeks of maverick for hep c with Dr Linus Salmons on 07/08/18.  -Kidney numbers improved. Creatinine in the 1.7-2.2 range   5) h/o tobacco abuse-counseled on smoking cessation -Currently not smoking.   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 Hemoglobin is stable at 11.6  PLAN: -Labs done today were reviewed in detail.  CBC and CMP stable and unremarkable. -Patient had some right thigh pain and had work-up that ruled out DVT and any other new osseous lesions or hardware complications.  His right thigh pain has since abated. -No other acute new focal symptoms noted. -No notable toxicities from his current treatment. -Was done today show stable CBC with mild anemia but normal platelets and WBC count Stable CMP Last myeloma labs on 08/18/2022 showed normal serum kappa lambda free light chain ratio.  Myeloma panel with no M spike. -we shall hold his Carfilzomib today to allow him to recover from his URI. -he will f/u for next rx in 2 weeks -Continue aspirin 81 mg p.o. daily for VTE prophylaxis. -Continue ergocalciferol 50,000 units weekly -Continue daily multivitamin -Continue Aredia every 8 weeks for maintenance. -discussed with patient to get flu shot, RSV vaccine and new covid 19 vaccine.  FOLLOW UP: F/u as per next treatment appointments   .The total time spent in the appointment was 30 minutes* .  All of the patient's questions were answered with apparent satisfaction. The patient knows to call the clinic with any problems, questions or concerns.   Sullivan Lone MD MS AAHIVMS [facility] Hematology/Oncology  Physician [facility]  .*Total Encounter Time as defined by the Centers for Medicare and Medicaid Services includes, in addition to the face-to-face time of a patient visit (documented in the note above) non-face-to-face time: obtaining and reviewing outside history, ordering and reviewing medications, tests or procedures, care coordination (communications with other health care professionals or caregivers) and documentation in the medical record.   I, Melene Muller, am acting as scribe for Dr. Sullivan Lone, MD.  .I have reviewed the above documentation for accuracy and completeness, and I agree with the above.Brunetta Genera MD

## 2022-09-08 ENCOUNTER — Encounter: Payer: Self-pay | Admitting: Hematology

## 2022-09-09 ENCOUNTER — Other Ambulatory Visit: Payer: Self-pay | Admitting: Hematology

## 2022-09-11 ENCOUNTER — Encounter: Payer: Self-pay | Admitting: Hematology

## 2022-09-14 ENCOUNTER — Other Ambulatory Visit: Payer: Self-pay

## 2022-09-14 ENCOUNTER — Other Ambulatory Visit: Payer: Self-pay | Admitting: Hematology

## 2022-09-14 DIAGNOSIS — C9010 Plasma cell leukemia not having achieved remission: Secondary | ICD-10-CM

## 2022-09-14 DIAGNOSIS — Z7189 Other specified counseling: Secondary | ICD-10-CM

## 2022-09-14 MED FILL — Dexamethasone Sodium Phosphate Inj 100 MG/10ML: INTRAMUSCULAR | Qty: 2 | Status: AC

## 2022-09-14 NOTE — Progress Notes (Signed)
[facility] [facility] orders updated

## 2022-09-15 ENCOUNTER — Inpatient Hospital Stay: Payer: Medicare Other | Attending: Hematology

## 2022-09-15 ENCOUNTER — Inpatient Hospital Stay: Payer: Medicare Other

## 2022-09-15 VITALS — BP 130/73 | HR 80 | Temp 97.8°F | Resp 16 | Wt 157.5 lb

## 2022-09-15 DIAGNOSIS — Z5111 Encounter for antineoplastic chemotherapy: Secondary | ICD-10-CM | POA: Insufficient documentation

## 2022-09-15 DIAGNOSIS — M84551A Pathological fracture in neoplastic disease, right femur, initial encounter for fracture: Secondary | ICD-10-CM

## 2022-09-15 DIAGNOSIS — C9010 Plasma cell leukemia not having achieved remission: Secondary | ICD-10-CM

## 2022-09-15 DIAGNOSIS — Z95828 Presence of other vascular implants and grafts: Secondary | ICD-10-CM

## 2022-09-15 DIAGNOSIS — Z7189 Other specified counseling: Secondary | ICD-10-CM

## 2022-09-15 DIAGNOSIS — C9011 Plasma cell leukemia in remission: Secondary | ICD-10-CM | POA: Insufficient documentation

## 2022-09-15 DIAGNOSIS — C9001 Multiple myeloma in remission: Secondary | ICD-10-CM | POA: Insufficient documentation

## 2022-09-15 LAB — CMP (CANCER CENTER ONLY)
ALT: 10 U/L (ref 0–44)
AST: 10 U/L — ABNORMAL LOW (ref 15–41)
Albumin: 3.8 g/dL (ref 3.5–5.0)
Alkaline Phosphatase: 74 U/L (ref 38–126)
Anion gap: 8 (ref 5–15)
BUN: 20 mg/dL (ref 8–23)
CO2: 24 mmol/L (ref 22–32)
Calcium: 8.7 mg/dL — ABNORMAL LOW (ref 8.9–10.3)
Chloride: 109 mmol/L (ref 98–111)
Creatinine: 1.51 mg/dL — ABNORMAL HIGH (ref 0.61–1.24)
GFR, Estimated: 51 mL/min — ABNORMAL LOW (ref >60)
Glucose, Bld: 134 mg/dL — ABNORMAL HIGH (ref 70–99)
Potassium: 3.6 mmol/L (ref 3.5–5.1)
Sodium: 141 mmol/L (ref 135–145)
Total Bilirubin: 0.4 mg/dL (ref 0.3–1.2)
Total Protein: 6.9 g/dL (ref 6.5–8.1)

## 2022-09-15 LAB — CBC WITH DIFFERENTIAL (CANCER CENTER ONLY)
Abs Immature Granulocytes: 0.02 10*3/uL (ref 0.00–0.07)
Basophils Absolute: 0.1 10*3/uL (ref 0.0–0.1)
Basophils Relative: 2 %
Eosinophils Absolute: 0.5 10*3/uL (ref 0.0–0.5)
Eosinophils Relative: 9 %
HCT: 33 % — ABNORMAL LOW (ref 39.0–52.0)
Hemoglobin: 11.2 g/dL — ABNORMAL LOW (ref 13.0–17.0)
Immature Granulocytes: 0 %
Lymphocytes Relative: 26 %
Lymphs Abs: 1.3 10*3/uL (ref 0.7–4.0)
MCH: 32.6 pg (ref 26.0–34.0)
MCHC: 33.9 g/dL (ref 30.0–36.0)
MCV: 95.9 fL (ref 80.0–100.0)
Monocytes Absolute: 0.7 10*3/uL (ref 0.1–1.0)
Monocytes Relative: 13 %
Neutro Abs: 2.5 10*3/uL (ref 1.7–7.7)
Neutrophils Relative %: 50 %
Platelet Count: 212 10*3/uL (ref 150–400)
RBC: 3.44 MIL/uL — ABNORMAL LOW (ref 4.22–5.81)
RDW: 16.2 % — ABNORMAL HIGH (ref 11.5–15.5)
WBC Count: 5 10*3/uL (ref 4.0–10.5)
nRBC: 0 % (ref 0.0–0.2)

## 2022-09-15 MED ORDER — SODIUM CHLORIDE 0.9 % IV SOLN: Freq: Once | INTRAVENOUS | Status: AC

## 2022-09-15 MED ORDER — FAMOTIDINE IN NACL 20-0.9 MG/50ML-% IV SOLN
20.0000 mg | Freq: Once | INTRAVENOUS | Status: AC
  Administered 2022-09-15: 20 mg via INTRAVENOUS
  Filled 2022-09-15: qty 50

## 2022-09-15 MED ORDER — SODIUM CHLORIDE 0.9 % IV SOLN
12.0000 mg | Freq: Once | INTRAVENOUS | Status: AC
  Administered 2022-09-15: 12 mg via INTRAVENOUS
  Filled 2022-09-15: qty 1.2

## 2022-09-15 MED ORDER — DEXTROSE 5 % IV SOLN
56.0000 mg/m2 | Freq: Once | INTRAVENOUS | Status: AC
  Administered 2022-09-15: 100 mg via INTRAVENOUS
  Filled 2022-09-15: qty 5

## 2022-09-15 MED ORDER — ACETAMINOPHEN 325 MG PO TABS
650.0000 mg | ORAL_TABLET | Freq: Once | ORAL | Status: AC
  Administered 2022-09-15: 650 mg via ORAL
  Filled 2022-09-15: qty 2

## 2022-09-15 MED ORDER — HEPARIN SOD (PORK) LOCK FLUSH 100 UNIT/ML IV SOLN
500.0000 [IU] | Freq: Once | INTRAVENOUS | Status: AC | PRN
  Administered 2022-09-15: 500 [IU]

## 2022-09-15 MED ORDER — SODIUM CHLORIDE 0.9% FLUSH
10.0000 mL | Freq: Once | INTRAVENOUS | Status: AC
  Administered 2022-09-15: 10 mL

## 2022-09-15 MED ORDER — SODIUM CHLORIDE 0.9% FLUSH
10.0000 mL | INTRAVENOUS | Status: DC | PRN
  Administered 2022-09-15: 10 mL

## 2022-09-15 NOTE — Patient Instructions (Signed)
[facility] MEDICAL ONCOLOGY  Discharge Instructions: Thank you for choosing [facility] to provide your oncology and hematology care.   If you have a lab appointment with the Cancer Center, please go directly to the Cancer Center and check in at the registration area.   Wear comfortable clothing and clothing appropriate for easy access to any Portacath or PICC line.   We strive to give you quality time with your provider. You may need to reschedule your appointment if you arrive late (15 or more minutes).  Arriving late affects you and other patients whose appointments are after yours.  Also, if you miss three or more appointments without notifying the office, you may be dismissed from the clinic at the provider's discretion.      For prescription refill requests, have your pharmacy contact our office and allow 72 hours for refills to be completed.    Today you received the following chemotherapy and/or immunotherapy agents: carfilzomib      To help prevent nausea and vomiting after your treatment, we encourage you to take your nausea medication as directed.  BELOW ARE SYMPTOMS THAT SHOULD BE REPORTED IMMEDIATELY: *FEVER GREATER THAN 100.4 F (38 C) OR HIGHER *CHILLS OR SWEATING *NAUSEA AND VOMITING THAT IS NOT CONTROLLED WITH YOUR NAUSEA MEDICATION *UNUSUAL SHORTNESS OF BREATH *UNUSUAL BRUISING OR BLEEDING *URINARY PROBLEMS (pain or burning when urinating, or frequent urination) *BOWEL PROBLEMS (unusual diarrhea, constipation, pain near the anus) TENDERNESS IN MOUTH AND THROAT WITH OR WITHOUT PRESENCE OF ULCERS (sore throat, sores in mouth, or a toothache) UNUSUAL RASH, SWELLING OR PAIN  UNUSUAL VAGINAL DISCHARGE OR ITCHING   Items with * indicate a potential emergency and should be followed up as soon as possible or go to the Emergency Department if any problems should occur.  Please show the CHEMOTHERAPY ALERT CARD or IMMUNOTHERAPY ALERT CARD at check-in  to the Emergency Department and triage nurse.  Should you have questions after your visit or need to cancel or reschedule your appointment, please contact [facility] MEDICAL ONCOLOGY  Dept: [phone]  and follow the prompts.  Office hours are 8:00 a.m. to 4:30 p.m. Monday - Friday. Please note that voicemails left after 4:00 p.m. may not be returned until the following business day.  We are closed weekends and major holidays. You have access to a nurse at all times for urgent questions. Please call the main number to the clinic Dept: [phone] and follow the prompts.   For any non-urgent questions, you may also contact your provider using MyChart. We now offer e-Visits for anyone 18 and older to request care online for non-urgent symptoms. For details visit [email or web address].   Also download the MyChart app! Go to the app store, search "MyChart", open the app, select , and log in with your MyChart username and password.  Masks are optional in the cancer centers. If you would like for your care team to wear a mask while they are taking care of you, please let them know. You may have one support person who is at least 65 years old accompany you for your appointments. 

## 2022-09-17 ENCOUNTER — Other Ambulatory Visit: Payer: Self-pay

## 2022-09-18 LAB — KAPPA/LAMBDA LIGHT CHAINS
Kappa free light chain: 46.4 mg/L — ABNORMAL HIGH (ref 3.3–19.4)
Kappa, lambda light chain ratio: 2.64 — ABNORMAL HIGH (ref 0.26–1.65)
Lambda free light chains: 17.6 mg/L (ref 5.7–26.3)

## 2022-09-22 LAB — MULTIPLE MYELOMA PANEL, SERUM
Albumin SerPl Elph-Mcnc: 3.4 g/dL (ref 2.9–4.4)
Albumin/Glob SerPl: 1.2 (ref 0.7–1.7)
Alpha 1: 0.2 g/dL (ref 0.0–0.4)
Alpha2 Glob SerPl Elph-Mcnc: 1.1 g/dL — ABNORMAL HIGH (ref 0.4–1.0)
B-Globulin SerPl Elph-Mcnc: 0.9 g/dL (ref 0.7–1.3)
Gamma Glob SerPl Elph-Mcnc: 0.7 g/dL (ref 0.4–1.8)
Globulin, Total: 3 g/dL (ref 2.2–3.9)
IgA: 84 mg/dL (ref 61–437)
IgG (Immunoglobin G), Serum: 812 mg/dL (ref 603–1613)
IgM (Immunoglobulin M), Srm: 9 mg/dL — ABNORMAL LOW (ref 20–172)
Total Protein ELP: 6.4 g/dL (ref 6.0–8.5)

## 2022-09-29 ENCOUNTER — Inpatient Hospital Stay: Payer: Medicare Other

## 2022-09-29 ENCOUNTER — Other Ambulatory Visit: Payer: Self-pay

## 2022-09-29 VITALS — BP 132/85 | HR 87 | Temp 98.2°F | Resp 18 | Wt 158.0 lb

## 2022-09-29 DIAGNOSIS — Z5111 Encounter for antineoplastic chemotherapy: Secondary | ICD-10-CM | POA: Diagnosis not present

## 2022-09-29 DIAGNOSIS — C9010 Plasma cell leukemia not having achieved remission: Secondary | ICD-10-CM

## 2022-09-29 DIAGNOSIS — Z7189 Other specified counseling: Secondary | ICD-10-CM

## 2022-09-29 LAB — CBC WITH DIFFERENTIAL (CANCER CENTER ONLY)
Abs Immature Granulocytes: 0.02 10*3/uL (ref 0.00–0.07)
Basophils Absolute: 0 10*3/uL (ref 0.0–0.1)
Basophils Relative: 1 %
Eosinophils Absolute: 0.3 10*3/uL (ref 0.0–0.5)
Eosinophils Relative: 5 %
HCT: 35.1 % — ABNORMAL LOW (ref 39.0–52.0)
Hemoglobin: 11.9 g/dL — ABNORMAL LOW (ref 13.0–17.0)
Immature Granulocytes: 0 %
Lymphocytes Relative: 23 %
Lymphs Abs: 1.4 10*3/uL (ref 0.7–4.0)
MCH: 31.7 pg (ref 26.0–34.0)
MCHC: 33.9 g/dL (ref 30.0–36.0)
MCV: 93.6 fL (ref 80.0–100.0)
Monocytes Absolute: 0.6 10*3/uL (ref 0.1–1.0)
Monocytes Relative: 10 %
Neutro Abs: 3.7 10*3/uL (ref 1.7–7.7)
Neutrophils Relative %: 61 %
Platelet Count: 217 10*3/uL (ref 150–400)
RBC: 3.75 MIL/uL — ABNORMAL LOW (ref 4.22–5.81)
RDW: 15.9 % — ABNORMAL HIGH (ref 11.5–15.5)
WBC Count: 6 10*3/uL (ref 4.0–10.5)
nRBC: 0 % (ref 0.0–0.2)

## 2022-09-29 LAB — CMP (CANCER CENTER ONLY)
ALT: 11 U/L (ref 0–44)
AST: 14 U/L — ABNORMAL LOW (ref 15–41)
Albumin: 4.2 g/dL (ref 3.5–5.0)
Alkaline Phosphatase: 65 U/L (ref 38–126)
Anion gap: 9 (ref 5–15)
BUN: 25 mg/dL — ABNORMAL HIGH (ref 8–23)
CO2: 24 mmol/L (ref 22–32)
Calcium: 9 mg/dL (ref 8.9–10.3)
Chloride: 108 mmol/L (ref 98–111)
Creatinine: 1.69 mg/dL — ABNORMAL HIGH (ref 0.61–1.24)
GFR, Estimated: 44 mL/min — ABNORMAL LOW (ref >60)
Glucose, Bld: 123 mg/dL — ABNORMAL HIGH (ref 70–99)
Potassium: 4.1 mmol/L (ref 3.5–5.1)
Sodium: 141 mmol/L (ref 135–145)
Total Bilirubin: 0.5 mg/dL (ref 0.3–1.2)
Total Protein: 6.8 g/dL (ref 6.5–8.1)

## 2022-09-29 MED ORDER — SODIUM CHLORIDE 0.9 % IV SOLN: Freq: Once | INTRAVENOUS | Status: AC

## 2022-09-29 MED ORDER — ACETAMINOPHEN 325 MG PO TABS
650.0000 mg | ORAL_TABLET | Freq: Once | ORAL | Status: AC
  Administered 2022-09-29: 650 mg via ORAL
  Filled 2022-09-29: qty 2

## 2022-09-29 MED ORDER — DEXTROSE 5 % IV SOLN
56.0000 mg/m2 | Freq: Once | INTRAVENOUS | Status: AC
  Administered 2022-09-29: 100 mg via INTRAVENOUS
  Filled 2022-09-29: qty 5

## 2022-09-29 MED ORDER — HEPARIN SOD (PORK) LOCK FLUSH 100 UNIT/ML IV SOLN
500.0000 [IU] | Freq: Once | INTRAVENOUS | Status: AC | PRN
  Administered 2022-09-29: 500 [IU]

## 2022-09-29 MED ORDER — SODIUM CHLORIDE 0.9% FLUSH
10.0000 mL | INTRAVENOUS | Status: DC | PRN
  Administered 2022-09-29: 10 mL

## 2022-09-29 MED ORDER — SODIUM CHLORIDE 0.9 % IV SOLN
12.0000 mg | Freq: Once | INTRAVENOUS | Status: AC
  Administered 2022-09-29: 12 mg via INTRAVENOUS
  Filled 2022-09-29: qty 1.2

## 2022-09-29 MED ORDER — FAMOTIDINE IN NACL 20-0.9 MG/50ML-% IV SOLN
20.0000 mg | Freq: Once | INTRAVENOUS | Status: AC
  Administered 2022-09-29: 20 mg via INTRAVENOUS
  Filled 2022-09-29: qty 50

## 2022-09-29 NOTE — Progress Notes (Signed)
Per Lorenso Courier MD, ok to treat with SCR 1.69 today.

## 2022-09-29 NOTE — Patient Instructions (Signed)
[facility] MEDICAL ONCOLOGY  Discharge Instructions: Thank you for choosing [facility] to provide your oncology and hematology care.   If you have a lab appointment with the Cancer Center, please go directly to the Cancer Center and check in at the registration area.   Wear comfortable clothing and clothing appropriate for easy access to any Portacath or PICC line.   We strive to give you quality time with your provider. You may need to reschedule your appointment if you arrive late (15 or more minutes).  Arriving late affects you and other patients whose appointments are after yours.  Also, if you miss three or more appointments without notifying the office, you may be dismissed from the clinic at the provider's discretion.      For prescription refill requests, have your pharmacy contact our office and allow 72 hours for refills to be completed.    Today you received the following chemotherapy and/or immunotherapy agents: Kyprolis.       To help prevent nausea and vomiting after your treatment, we encourage you to take your nausea medication as directed.  BELOW ARE SYMPTOMS THAT SHOULD BE REPORTED IMMEDIATELY: *FEVER GREATER THAN 100.4 F (38 C) OR HIGHER *CHILLS OR SWEATING *NAUSEA AND VOMITING THAT IS NOT CONTROLLED WITH YOUR NAUSEA MEDICATION *UNUSUAL SHORTNESS OF BREATH *UNUSUAL BRUISING OR BLEEDING *URINARY PROBLEMS (pain or burning when urinating, or frequent urination) *BOWEL PROBLEMS (unusual diarrhea, constipation, pain near the anus) TENDERNESS IN MOUTH AND THROAT WITH OR WITHOUT PRESENCE OF ULCERS (sore throat, sores in mouth, or a toothache) UNUSUAL RASH, SWELLING OR PAIN  UNUSUAL VAGINAL DISCHARGE OR ITCHING   Items with * indicate a potential emergency and should be followed up as soon as possible or go to the Emergency Department if any problems should occur.  Please show the CHEMOTHERAPY ALERT CARD or IMMUNOTHERAPY ALERT CARD at check-in to  the Emergency Department and triage nurse.  Should you have questions after your visit or need to cancel or reschedule your appointment, please contact [facility] MEDICAL ONCOLOGY  Dept: [phone]  and follow the prompts.  Office hours are 8:00 a.m. to 4:30 p.m. Monday - Friday. Please note that voicemails left after 4:00 p.m. may not be returned until the following business day.  We are closed weekends and major holidays. You have access to a nurse at all times for urgent questions. Please call the main number to the clinic Dept: [phone] and follow the prompts.   For any non-urgent questions, you may also contact your provider using MyChart. We now offer e-Visits for anyone 18 and older to request care online for non-urgent symptoms. For details visit [email or web address].   Also download the MyChart app! Go to the app store, search "MyChart", open the app, select [facility], and log in with your MyChart username and password.  Masks are optional in the cancer centers. If you would like for your care team to wear a mask while they are taking care of you, please let them know. You may have one support person who is at least 65 years old accompany you for your appointments. 

## 2022-09-29 NOTE — Progress Notes (Signed)
Pt refused to stay for Aredia infusion today, stating transportation and scheduling conflicts. Charge RN made aware. Pt agreed to stay for Aredia infusion during next appointment.

## 2022-09-30 ENCOUNTER — Other Ambulatory Visit: Payer: Self-pay

## 2022-10-03 ENCOUNTER — Other Ambulatory Visit: Payer: Self-pay

## 2022-10-03 DIAGNOSIS — C9010 Plasma cell leukemia not having achieved remission: Secondary | ICD-10-CM

## 2022-10-03 MED ORDER — LENALIDOMIDE 10 MG PO CAPS: 10.0000 mg | ORAL_CAPSULE | Freq: Every day | ORAL | 0 refills | Status: DC

## 2022-10-04 LAB — MULTIPLE MYELOMA PANEL, SERUM
Albumin SerPl Elph-Mcnc: 3.6 g/dL (ref 2.9–4.4)
Albumin/Glob SerPl: 1.2 (ref 0.7–1.7)
Alpha 1: 0.3 g/dL (ref 0.0–0.4)
Alpha2 Glob SerPl Elph-Mcnc: 1.1 g/dL — ABNORMAL HIGH (ref 0.4–1.0)
B-Globulin SerPl Elph-Mcnc: 0.9 g/dL (ref 0.7–1.3)
Gamma Glob SerPl Elph-Mcnc: 0.8 g/dL (ref 0.4–1.8)
Globulin, Total: 3.1 g/dL (ref 2.2–3.9)
IgA: 86 mg/dL (ref 61–437)
IgG (Immunoglobin G), Serum: 818 mg/dL (ref 603–1613)
IgM (Immunoglobulin M), Srm: 9 mg/dL — ABNORMAL LOW (ref 20–172)
Total Protein ELP: 6.7 g/dL (ref 6.0–8.5)

## 2022-10-11 ENCOUNTER — Other Ambulatory Visit: Payer: Self-pay

## 2022-10-13 ENCOUNTER — Inpatient Hospital Stay: Payer: Medicare Other

## 2022-10-13 ENCOUNTER — Inpatient Hospital Stay (HOSPITAL_BASED_OUTPATIENT_CLINIC_OR_DEPARTMENT_OTHER): Payer: Medicare Other | Admitting: Hematology

## 2022-10-13 ENCOUNTER — Inpatient Hospital Stay: Payer: Medicare Other | Attending: Hematology

## 2022-10-13 VITALS — BP 117/87 | HR 77 | Temp 98.3°F | Resp 18

## 2022-10-13 DIAGNOSIS — Z95828 Presence of other vascular implants and grafts: Secondary | ICD-10-CM

## 2022-10-13 DIAGNOSIS — Z7189 Other specified counseling: Secondary | ICD-10-CM | POA: Diagnosis not present

## 2022-10-13 DIAGNOSIS — C9010 Plasma cell leukemia not having achieved remission: Secondary | ICD-10-CM | POA: Diagnosis not present

## 2022-10-13 DIAGNOSIS — C9011 Plasma cell leukemia in remission: Secondary | ICD-10-CM | POA: Diagnosis not present

## 2022-10-13 DIAGNOSIS — M84551A Pathological fracture in neoplastic disease, right femur, initial encounter for fracture: Secondary | ICD-10-CM

## 2022-10-13 DIAGNOSIS — Z5111 Encounter for antineoplastic chemotherapy: Secondary | ICD-10-CM | POA: Insufficient documentation

## 2022-10-13 DIAGNOSIS — C9001 Multiple myeloma in remission: Secondary | ICD-10-CM | POA: Insufficient documentation

## 2022-10-13 LAB — CMP (CANCER CENTER ONLY)
ALT: 11 U/L (ref 0–44)
AST: 12 U/L — ABNORMAL LOW (ref 15–41)
Albumin: 4 g/dL (ref 3.5–5.0)
Alkaline Phosphatase: 72 U/L (ref 38–126)
Anion gap: 5 (ref 5–15)
BUN: 26 mg/dL — ABNORMAL HIGH (ref 8–23)
CO2: 26 mmol/L (ref 22–32)
Calcium: 9.3 mg/dL (ref 8.9–10.3)
Chloride: 109 mmol/L (ref 98–111)
Creatinine: 1.6 mg/dL — ABNORMAL HIGH (ref 0.61–1.24)
GFR, Estimated: 48 mL/min — ABNORMAL LOW (ref >60)
Glucose, Bld: 93 mg/dL (ref 70–99)
Potassium: 4.4 mmol/L (ref 3.5–5.1)
Sodium: 140 mmol/L (ref 135–145)
Total Bilirubin: 0.4 mg/dL (ref 0.3–1.2)
Total Protein: 7.1 g/dL (ref 6.5–8.1)

## 2022-10-13 LAB — CBC WITH DIFFERENTIAL (CANCER CENTER ONLY)
Abs Immature Granulocytes: 0.02 10*3/uL (ref 0.00–0.07)
Basophils Absolute: 0.2 10*3/uL — ABNORMAL HIGH (ref 0.0–0.1)
Basophils Relative: 3 %
Eosinophils Absolute: 0.2 10*3/uL (ref 0.0–0.5)
Eosinophils Relative: 3 %
HCT: 35 % — ABNORMAL LOW (ref 39.0–52.0)
Hemoglobin: 12 g/dL — ABNORMAL LOW (ref 13.0–17.0)
Immature Granulocytes: 0 %
Lymphocytes Relative: 27 %
Lymphs Abs: 1.8 10*3/uL (ref 0.7–4.0)
MCH: 31.7 pg (ref 26.0–34.0)
MCHC: 34.3 g/dL (ref 30.0–36.0)
MCV: 92.3 fL (ref 80.0–100.0)
Monocytes Absolute: 0.9 10*3/uL (ref 0.1–1.0)
Monocytes Relative: 14 %
Neutro Abs: 3.5 10*3/uL (ref 1.7–7.7)
Neutrophils Relative %: 53 %
Platelet Count: 244 10*3/uL (ref 150–400)
RBC: 3.79 MIL/uL — ABNORMAL LOW (ref 4.22–5.81)
RDW: 15.1 % (ref 11.5–15.5)
WBC Count: 6.5 10*3/uL (ref 4.0–10.5)
nRBC: 0 % (ref 0.0–0.2)

## 2022-10-13 MED ORDER — AZITHROMYCIN 250 MG PO TABS: ORAL_TABLET | ORAL | 0 refills | Status: DC

## 2022-10-13 MED ORDER — SODIUM CHLORIDE 0.9 % IV SOLN
60.0000 mg | Freq: Once | INTRAVENOUS | Status: AC
  Administered 2022-10-13: 60 mg via INTRAVENOUS
  Filled 2022-10-13: qty 10

## 2022-10-13 MED ORDER — SODIUM CHLORIDE 0.9 % IV SOLN: Freq: Once | INTRAVENOUS | Status: AC

## 2022-10-13 MED ORDER — SODIUM CHLORIDE 0.9% FLUSH
10.0000 mL | INTRAVENOUS | Status: DC | PRN
  Administered 2022-10-13: 10 mL

## 2022-10-13 MED ORDER — ACETAMINOPHEN 325 MG PO TABS
650.0000 mg | ORAL_TABLET | Freq: Once | ORAL | Status: AC
  Administered 2022-10-13: 650 mg via ORAL
  Filled 2022-10-13: qty 2

## 2022-10-13 MED ORDER — SODIUM CHLORIDE 0.9% FLUSH
10.0000 mL | Freq: Once | INTRAVENOUS | Status: AC
  Administered 2022-10-13: 10 mL

## 2022-10-13 MED ORDER — DEXTROSE 5 % IV SOLN
56.0000 mg/m2 | Freq: Once | INTRAVENOUS | Status: AC
  Administered 2022-10-13: 100 mg via INTRAVENOUS
  Filled 2022-10-13: qty 30

## 2022-10-13 MED ORDER — HEPARIN SOD (PORK) LOCK FLUSH 100 UNIT/ML IV SOLN
500.0000 [IU] | Freq: Once | INTRAVENOUS | Status: AC | PRN
  Administered 2022-10-13: 500 [IU]

## 2022-10-13 MED ORDER — FAMOTIDINE IN NACL 20-0.9 MG/50ML-% IV SOLN
20.0000 mg | Freq: Once | INTRAVENOUS | Status: AC
  Administered 2022-10-13: 20 mg via INTRAVENOUS
  Filled 2022-10-13: qty 50

## 2022-10-13 MED ORDER — SODIUM CHLORIDE 0.9 % IV SOLN
12.0000 mg | Freq: Once | INTRAVENOUS | Status: AC
  Administered 2022-10-13: 12 mg via INTRAVENOUS
  Filled 2022-10-13: qty 1.2

## 2022-10-13 NOTE — Patient Instructions (Signed)
[facility] ONCOLOGY  Discharge Instructions: Thank you for choosing [facility] to provide your oncology and hematology care.   If you have a lab appointment with the [facility], please go directly to the [facility] and check in at the registration area.   Wear comfortable clothing and clothing appropriate for easy access to any Portacath or PICC line.   We strive to give you quality time with your provider. You may need to reschedule your appointment if you arrive late (15 or more minutes).  Arriving late affects you and other patients whose appointments are after yours.  Also, if you miss three or more appointments without notifying the office, you may be dismissed from the clinic at the provider's discretion.      For prescription refill requests, have your pharmacy contact our office and allow 72 hours for refills to be completed.    Today you received the following chemotherapy and/or immunotherapy agents: Kyprolis.      To help prevent nausea and vomiting after your treatment, we encourage you to take your nausea medication as directed.  BELOW ARE SYMPTOMS THAT SHOULD BE REPORTED IMMEDIATELY: *FEVER GREATER THAN 100.4 F (38 C) OR HIGHER *CHILLS OR SWEATING *NAUSEA AND VOMITING THAT IS NOT CONTROLLED WITH YOUR NAUSEA MEDICATION *UNUSUAL SHORTNESS OF BREATH *UNUSUAL BRUISING OR BLEEDING *URINARY PROBLEMS (pain or burning when urinating, or frequent urination) *BOWEL PROBLEMS (unusual diarrhea, constipation, pain near the anus) TENDERNESS IN MOUTH AND THROAT WITH OR WITHOUT PRESENCE OF ULCERS (sore throat, sores in mouth, or a toothache) UNUSUAL RASH, SWELLING OR PAIN  UNUSUAL VAGINAL DISCHARGE OR ITCHING   Items with * indicate a potential emergency and should be followed up as soon as possible or go to the Emergency Department if any problems should occur.  Please show the CHEMOTHERAPY ALERT CARD or IMMUNOTHERAPY ALERT CARD at check-in to  the Emergency Department and triage nurse.  Should you have questions after your visit or need to cancel or reschedule your appointment, please contact [facility]  Dept: [phone]  and follow the prompts.  Office hours are 8:00 a.m. to 4:30 p.m. Monday - Friday. Please note that voicemails left after 4:00 p.m. may not be returned until the following business day.  We are closed weekends and major holidays. You have access to a nurse at all times for urgent questions. Please call the main number to the clinic Dept: [phone] and follow the prompts.   For any non-urgent questions, you may also contact your provider using MyChart. We now offer e-Visits for anyone 59 and older to request care online for non-urgent symptoms. For details visit [email or web address].   Also download the MyChart app! Go to the app store, search "MyChart", open the app, select [facility], and log in with your MyChart username and password.  Masks are optional in the cancer centers. If you would like for your care team to wear a mask while they are taking care of you, please let them know. You may have one support person who is at least 65 years old accompany you for your appointments. Pamidronate Injection What is this medication? PAMIDRONATE (pa mi DROE nate) treats high calcium levels in the blood caused by cancer. It may also be used with chemotherapy to treat weakened bones caused by cancer. It can also be used to treat Paget's disease of the bone. It works by slowing down the release of calcium from bones. This lowers calcium levels  in your blood. It also makes your bones stronger and less likely to break (fracture). It belongs to a group of medications called bisphosphonates. This medicine may be used for other purposes; ask your health care provider or pharmacist if you have questions. COMMON BRAND NAME(S): Aredia What should I tell my care team before I take this  medication? They need to know if you have any of these conditions: Bleeding disorder Cancer Dental disease Kidney disease Low levels of calcium or other minerals in the blood Low red blood cell counts Receiving steroids, such as dexamethasone or prednisone An unusual or allergic reaction to pamidronate, other medications, foods, dyes or preservatives Pregnant or trying to get pregnant Breast-feeding How should I use this medication? This medication is injected into a vein. It is given by your care team in a hospital or clinic setting. Talk to your care team about the use of this medication in children. Special care may be needed. Overdosage: If you think you have taken too much of this medicine contact a poison control center or emergency room at once. NOTE: This medicine is only for you. Do not share this medicine with others. What if I miss a dose? Keep appointments for follow-up doses. It is important not to miss your dose. Call your care team if you are unable to keep an appointment. What may interact with this medication? Certain antibiotics given by injection Medications for inflammation or pain, such as ibuprofen, naproxen Some diuretics, such as bumetanide, furosemide Cyclosporine Parathyroid hormone Tacrolimus Teriparatide Thalidomide This list may not describe all possible interactions. Give your health care provider a list of all the medicines, herbs, non-prescription drugs, or dietary supplements you use. Also tell them if you smoke, drink alcohol, or use illegal drugs. Some items may interact with your medicine. What should I watch for while using this medication? Visit your care team for regular checks on your progress. It may be some time before you see the benefit from this medication. Some people who take this medication have severe bone, joint, or muscle pain. This medication may also increase your risk for jaw problems or a broken thigh bone. Tell your care team  right away if you have severe pain in your jaw, bones, joints, or muscles. Tell your care team if you have any pain that does not go away or that gets worse. Tell your dentist and dental surgeon that you are taking this medication. You should not have major dental surgery while on this medication. See your dentist to have a dental exam and fix any dental problems before starting this medication. Take good care of your teeth while on this medication. Make sure you see your dentist for regular follow-up appointments. You should make sure you get enough calcium and vitamin D while you are taking this medication. Discuss the foods you eat and the vitamins you take with your care team. You may need bloodwork while you are taking this medication. Talk to your care team if you wish to become pregnant or think you might be pregnant. This medication can cause serious birth defects. What side effects may I notice from receiving this medication? Side effects that you should report to your care team as soon as possible: Allergic reactions--skin rash, itching, hives, swelling of the face, lips, tongue, or throat Kidney injury--decrease in the amount of urine, swelling of the ankles, hands, or feet Low calcium level--muscle pain or cramps, confusion, tingling, or numbness in the hands or feet Osteonecrosis of the  jaw--pain, swelling, or redness in the mouth, numbness of the jaw, poor healing after dental work, unusual discharge from the mouth, visible bones in the mouth Severe bone, joint, or muscle pain Side effects that usually do not require medical attention (report to your care team if they continue or are bothersome): Constipation Fatigue Fever Loss of appetite Nausea Pain, redness, or irritation at injection site Stomach pain This list may not describe all possible side effects. Call your doctor for medical advice about side effects. You may report side effects to FDA at 1-800-FDA-1088. Where should I  keep my medication? This medication is given in a hospital or clinic. It will not be stored at home. NOTE: This sheet is a summary. It may not cover all possible information. If you have questions about this medicine, talk to your doctor, pharmacist, or health care provider.  2023 Elsevier/Gold Standard (2022-01-12 00:00:00)

## 2022-10-13 NOTE — Progress Notes (Signed)
Per Irene Limbo MD, ok to treat with SCR 1.6 today

## 2022-10-17 ENCOUNTER — Other Ambulatory Visit: Payer: Self-pay

## 2022-10-20 ENCOUNTER — Encounter: Payer: Self-pay | Admitting: Hematology

## 2022-10-20 NOTE — Progress Notes (Signed)
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  INTERVAL HISTORY:  Andres Fernandez is here for continued evaluation and management of his multiple myeloma and continued carfilzomib maintenance.  He notes his respiratory infection has resolved.  No new infections.  No fevers no chills no night sweats no unexpected weight loss.  No new toxicities from his carfilzomib..  We discussed the importance of staying up to speed with his flu shot, RSV vaccine and COVID-19 booster vaccine as well as his pneumococcal vaccinations. Patient notes no new bone pains.  No change in bowel or bladder habits.   REVIEW OF SYSTEMS:   10 Point review of Systems was done is negative except as noted above.  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   Tobacco abuse     SURGICAL HISTORY: Past Surgical History:  Procedure Laterality Date   APPENDECTOMY     FEMUR IM NAIL Right 11/20/2017   Procedure: RIGHT FEMORAL INTRAMEDULLARY (IM) NAIL;  Surgeon: Nicholes Stairs, MD;  Location: [facility];  Service: Orthopedics;  Laterality: Right;   IR IMAGING GUIDED PORT INSERTION  08/19/2021    SOCIAL HISTORY: Social History   Socioeconomic History   Marital status: Single    Spouse name: Not on file   Number of children: Not on file   Years of education: Not on file   Highest education level: Not on file  Occupational History   Not on file  Tobacco Use   Smoking status: Every Day    Packs/day: 0.25    Types: Cigarettes    Last attempt to quit: 11/09/2017    Years since quitting: 4.9   Smokeless tobacco: Never   Tobacco comments:    1 cigarette/day as of 09/25/18   Vaping Use   Vaping Use: Never used  Substance and Sexual Activity   Alcohol use: Yes   Drug use: No   Sexual activity: Not on file  Other Topics Concern   Not on file  Social History Narrative   Not on file   Social Determinants of Health   Financial Resource Strain: Not on file  Food Insecurity: Not on file  Transportation Needs: Not on file  Physical Activity: Not on file  Stress: Not on file  Social Connections: Not on file  Intimate Partner Violence: Not on file    FAMILY HISTORY: Family History  Problem Relation Age of Onset   COPD Mother    Liver cancer Mother     ALLERGIES:  has No Known Allergies.  MEDICATIONS:  . Current Outpatient Medications on File Prior to Visit  Medication Sig Dispense Refill   acyclovir (ZOVIRAX) 400 MG tablet TAKE 1 TABLET BY MOUTH EVERY DAY 90 tablet 3   Albuterol Sulfate (PROAIR RESPICLICK) 478 (90 Base) MCG/ACT AEPB Inhale into the lungs. (Patient not taking: Reported on 09/01/2022)     amoxicillin-clavulanate (AUGMENTIN) 875-125 MG tablet Take 1 tablet by mouth 2 (two) times daily. (Patient not taking: Reported on 09/01/2022)     aspirin EC 81 MG tablet Take 1 tablet (81 mg total) by mouth daily. (Patient taking differently: Take 81 mg by mouth at bedtime.) 60 tablet 11   cholecalciferol (VITAMIN D3)  10 MCG (400 UNIT) TABS tablet Take 1 tablet (400 Units total) by mouth at bedtime. 30 tablet 3   FLOVENT DISKUS 250 MCG/ACT AEPB Inhale into the lungs. (Patient not taking: Reported on 09/01/2022)     ipratropium-albuterol (DUONEB) 0.5-2.5 (3) MG/3ML SOLN Inhale into the lungs. (Patient not taking: Reported on 09/01/2022)     lenalidomide (REVLIMID) 10 MG capsule Take 1 capsule (10 mg total) by mouth daily. Take for 21 days on, 7 days off, repeat every 28 days. 21 capsule 0   lidocaine-prilocaine (EMLA) cream Apply 1 application topically as needed. Apply to Port-A-Cath at least 1 hr prior to treatment. 30 g 0   Multiple Vitamins-Minerals  (MULTIVITAMIN WITH MINERALS) tablet Take 1 tablet by mouth at bedtime.      nicotine (NICODERM CQ - DOSED IN MG/24 HOURS) 14 mg/24hr patch PLACE 1 PATCH (14 MG TOTAL) ONTO THE SKIN DAILY. 28 patch 1   ondansetron (ZOFRAN) 4 MG tablet Take 2 tablets (8 mg total) by mouth every 8 (eight) hours as needed for nausea. (Patient not taking: Reported on 09/01/2022) 30 tablet 0   predniSONE (DELTASONE) 20 MG tablet Take 2 tablets (40 mg total) by mouth daily with breakfast. For the next four days (Patient not taking: Reported on 09/01/2022) 8 tablet 0   Vitamin D, Ergocalciferol, (DRISDOL) 1.25 MG (50000 UNIT) CAPS capsule Take 1 capsule (50,000 Units total) by mouth once a week. 12 capsule 6   Current Facility-Administered Medications on File Prior to Visit  Medication Dose Route Frequency Provider Last Rate Last Admin   acetaminophen (TYLENOL) 325 MG tablet            famotidine (PEPCID) 20 MG tablet             PHYSICAL EXAMINATION:  ECOG FS:1 - Symptomatic but completely ambulatory There were no vitals filed for this visit.  Wt Readings from Last 3 Encounters:  09/29/22 158 lb (71.7 kg)  09/15/22 157 lb 8 oz (71.4 kg)  09/01/22 157 lb 1.6 oz (71.3 kg)  NAD GENERAL:alert, in no acute distress and comfortable SKIN: no acute rashes, no significant lesions EYES: conjunctiva are pink and non-injected, sclera anicteric OROPHARYNX: MMM, no exudates, no oropharyngeal erythema or ulceration NECK: supple, no JVD LYMPH:  no palpable lymphadenopathy in the cervical, axillary or inguinal regions LUNGS: clear to auscultation b/l with normal respiratory effort HEART: regular rate & rhythm ABDOMEN:  normoactive bowel sounds , non tender, not distended. Extremity: no pedal edema PSYCH: alert & oriented x 3 with fluent speech NEURO: no focal motor/sensory deficits   LABORATORY DATA:  I have reviewed the data as listed     Latest Ref Rng & Units 10/13/2022    8:58 AM 09/29/2022    9:24 AM 09/15/2022     9:36 AM  CBC  WBC 4.0 - 10.5 K/uL 6.5  6.0  5.0   Hemoglobin 13.0 - 17.0 g/dL 12.0  11.9  11.2   Hematocrit 39.0 - 52.0 % 35.0  35.1  33.0   Platelets 150 - 400 K/uL 244  217  212    CBC    Component Value Date/Time   WBC 6.5 10/13/2022 0858   WBC 10.1 08/23/2022 0835   RBC 3.79 (L) 10/13/2022 0858   HGB 12.0 (L) 10/13/2022 0858   HGB 8.3 (L) 12/14/2017 1230   HCT 35.0 (L) 10/13/2022 0858   HCT 23.9 (L) 12/14/2017 1230   PLT 244 10/13/2022 0858   PLT 524 (H) 12/14/2017 1230  MCV 92.3 10/13/2022 0858   MCV 88.4 12/14/2017 1230   MCH 31.7 10/13/2022 0858   MCHC 34.3 10/13/2022 0858   RDW 15.1 10/13/2022 0858   RDW 15.3 (H) 12/14/2017 1230   LYMPHSABS 1.8 10/13/2022 0858   LYMPHSABS 1.4 12/14/2017 1230   MONOABS 0.9 10/13/2022 0858   MONOABS 0.6 12/14/2017 1230   EOSABS 0.2 10/13/2022 0858   EOSABS 0.1 12/14/2017 1230   BASOSABS 0.2 (H) 10/13/2022 0858   BASOSABS 0.1 12/14/2017 1230        Latest Ref Rng & Units 10/13/2022    8:58 AM 09/29/2022    9:24 AM 09/15/2022    9:36 AM  CMP  Glucose 70 - 99 mg/dL 93  123  134   BUN 8 - 23 mg/dL _0 Creatinine 0.61 - 1.24 mg/dL 1.60  1.69  1.51   Sodium 135 - 145 mmol/L 140  141  141   Potassium 3.5 - 5.1 mmol/L 4.4  4.1  3.6   Chloride 98 - 111 mmol/L 109  108  109   CO2 22 - 32 mmol/L _1 Calcium 8.9 - 10.3 mg/dL 9.3  9.0  8.7   Total Protein 6.5 - 8.1 g/dL 7.1  6.8  6.9   Total Bilirubin 0.3 - 1.2 mg/dL 0.4  0.5  0.4   Alkaline Phos 38 - 126 U/L 72  65  74   AST 15 - 41 U/L _2 ALT 0 - 44 U/L _3 03/09/20 ([id number]) Bone Marrow Biopsy at [facility]   07/02/2019 Bone Marrow Biopsy ([id number]) at [facility]    08/07/18 BM Bx:        RADIOGRAPHIC STUDIES: I have personally reviewed the radiological images as listed and agreed with the findings in the report. No results found.  ASSESSMENT & PLAN:    66 y.o. 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   Initial BM Bx showed >95% involvement with kappa restricted serum free light chains. 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 MoL Cy-   08/07/18 BM Bx revealed normocellular bone marrow with trilineage hematopoiesis and 1% plasma cells which indicates the pt is in remission 08/06/18 PET/CT revealed  Expansile lytic lesion involving the left iliac bone, unchanged. Lytic lesion involving the sternum, unchanged. While both are suspicious for myeloma, neither lesion is FDG avid. No hypermetabolic osseous lesions in the visualized axial and appendicular skeleton. Please note that the bilateral lower extremities were not imaged. No suspicious lymphadenopathy. Spleen is normal in size.    Pt began Ninlaro 56m maintenance after pt finished 13 cycles of CyBorD and BM bx showed only 1% plasma cells, then resumed CyBorD and held Ninlaro in anticipation of his tandem bone marrow transplants beginning in late December 2019 [facility]  11/19/18 High dose chemotherapy with Melphalan 1466mm2 and autologous stem cell rescue  03/20/19 High dose chemotherapy with Melphalan 20076m2 and autologous stem cell rescue. Non-infectious diarrhea, uncomplicated course otherwise.  07/02/2019 PET scan with results revealing "1. No FDG avid bone lesions are identified. 2.  Asymmetric uptake within the right, greater than left, palatine tonsils with mild fullness of the right tonsillar soft tissue. ENT consult vs attention on follow up suggested. 3.  Innumerable mixed sclerotic and lytic lesions throughout the axial and appendicular skeleton without associated hypermetabolic activity. 4.  Ancillary CT findings  as above."  07/02/2019 bone marrow biopsy ([id number])with results showing: "Trilineage hematopoieses with maturation. No plasma cell myeloma identified. Mild anemia and No Rouleaux or circulating plasma cells identified in the peripheral blood."  2) s/p Prophylactic IM nailing for rt femur  completed Physical Therapy at the [facility].  -patient previously reported improvement and is currently progressively back to full time work.  3) Thrombocytopenia resolved   4) Renal insufficiency --likely related to multiple myeloma.  Remained fairly stable with baseline creatinine of 1.5-2.1.  Creatinine is 1.6 today Plan -maintain follow up with nephrology referral to Dr Marval [facility] for continued treatment -Pt completed 8 weeks of maverick for hep c with Dr Linus Salmons on 07/08/18.  -Kidney numbers improved. Creatinine in the 1.7-2.2 range   5) h/o tobacco abuse-counseled on smoking cessation -Currently not smoking.   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 Hemoglobin is stable at 11.6  PLAN: Patient's labs done today were discussed in detail with him.\\ Patient has no lab or clinical evidence of progression of his plasma cell leukemia/multiple myeloma. His upper respiratory tract infection has resolved and he is appropriate to proceed with his next cycle of maintenance carfilzomib. He is continuing his maintenance Revlimid with aspirin prophylaxis -Continue ergocalciferol 50,000 units weekly -Continue daily multivitamin -Continue Aredia every 8 weeks for maintenance. -Again discussed with patient to get flu shot, RSV vaccine and new covid 19 vaccine.  FOLLOW UP: As per integrated scheduling  The total time spent in the appointment was 25 minutes*.  All of the patient's questions were answered with apparent satisfaction. The patient knows to call the clinic with any problems, questions or  concerns.   Sullivan Lone MD MS AAHIVMS [facility] Hematology/Oncology Physician [facility]  .*Total Encounter Time as defined by the Centers for Medicare and Medicaid Services includes, in addition to the face-to-face time of a patient visit (documented in the note above) non-face-to-face time: obtaining and reviewing outside history, ordering and reviewing medications, tests or procedures, care coordination (communications with other health care professionals or caregivers) and documentation in the medical record.

## 2022-10-27 ENCOUNTER — Other Ambulatory Visit: Payer: Self-pay

## 2022-10-27 ENCOUNTER — Inpatient Hospital Stay: Payer: Medicare Other

## 2022-10-27 VITALS — BP 130/81 | HR 84 | Temp 98.8°F | Resp 18 | Wt 158.1 lb

## 2022-10-27 DIAGNOSIS — Z7189 Other specified counseling: Secondary | ICD-10-CM

## 2022-10-27 DIAGNOSIS — Z95828 Presence of other vascular implants and grafts: Secondary | ICD-10-CM

## 2022-10-27 DIAGNOSIS — C9010 Plasma cell leukemia not having achieved remission: Secondary | ICD-10-CM

## 2022-10-27 DIAGNOSIS — Z5111 Encounter for antineoplastic chemotherapy: Secondary | ICD-10-CM | POA: Diagnosis not present

## 2022-10-27 DIAGNOSIS — M84551A Pathological fracture in neoplastic disease, right femur, initial encounter for fracture: Secondary | ICD-10-CM

## 2022-10-27 LAB — CBC WITH DIFFERENTIAL (CANCER CENTER ONLY)
Abs Immature Granulocytes: 0.02 10*3/uL (ref 0.00–0.07)
Basophils Absolute: 0.1 10*3/uL (ref 0.0–0.1)
Basophils Relative: 2 %
Eosinophils Absolute: 0.4 10*3/uL (ref 0.0–0.5)
Eosinophils Relative: 6 %
HCT: 34.7 % — ABNORMAL LOW (ref 39.0–52.0)
Hemoglobin: 12.1 g/dL — ABNORMAL LOW (ref 13.0–17.0)
Immature Granulocytes: 0 %
Lymphocytes Relative: 26 %
Lymphs Abs: 1.7 10*3/uL (ref 0.7–4.0)
MCH: 32.1 pg (ref 26.0–34.0)
MCHC: 34.9 g/dL (ref 30.0–36.0)
MCV: 92 fL (ref 80.0–100.0)
Monocytes Absolute: 0.7 10*3/uL (ref 0.1–1.0)
Monocytes Relative: 10 %
Neutro Abs: 3.8 10*3/uL (ref 1.7–7.7)
Neutrophils Relative %: 56 %
Platelet Count: 233 10*3/uL (ref 150–400)
RBC: 3.77 MIL/uL — ABNORMAL LOW (ref 4.22–5.81)
RDW: 15.7 % — ABNORMAL HIGH (ref 11.5–15.5)
WBC Count: 6.7 10*3/uL (ref 4.0–10.5)
nRBC: 0 % (ref 0.0–0.2)

## 2022-10-27 LAB — CMP (CANCER CENTER ONLY)
ALT: 11 U/L (ref 0–44)
AST: 12 U/L — ABNORMAL LOW (ref 15–41)
Albumin: 4.3 g/dL (ref 3.5–5.0)
Alkaline Phosphatase: 61 U/L (ref 38–126)
Anion gap: 7 (ref 5–15)
BUN: 24 mg/dL — ABNORMAL HIGH (ref 8–23)
CO2: 25 mmol/L (ref 22–32)
Calcium: 9.3 mg/dL (ref 8.9–10.3)
Chloride: 109 mmol/L (ref 98–111)
Creatinine: 1.6 mg/dL — ABNORMAL HIGH (ref 0.61–1.24)
GFR, Estimated: 48 mL/min — ABNORMAL LOW (ref >60)
Glucose, Bld: 115 mg/dL — ABNORMAL HIGH (ref 70–99)
Potassium: 4.1 mmol/L (ref 3.5–5.1)
Sodium: 141 mmol/L (ref 135–145)
Total Bilirubin: 0.4 mg/dL (ref 0.3–1.2)
Total Protein: 7.2 g/dL (ref 6.5–8.1)

## 2022-10-27 MED ORDER — SODIUM CHLORIDE 0.9% FLUSH
10.0000 mL | Freq: Once | INTRAVENOUS | Status: AC | PRN
  Administered 2022-10-27: 10 mL

## 2022-10-27 MED ORDER — SODIUM CHLORIDE 0.9 % IV SOLN
12.0000 mg | Freq: Once | INTRAVENOUS | Status: AC
  Administered 2022-10-27: 12 mg via INTRAVENOUS
  Filled 2022-10-27: qty 1.2

## 2022-10-27 MED ORDER — SODIUM CHLORIDE 0.9 % IV SOLN: Freq: Once | INTRAVENOUS | Status: AC

## 2022-10-27 MED ORDER — FAMOTIDINE IN NACL 20-0.9 MG/50ML-% IV SOLN
20.0000 mg | Freq: Once | INTRAVENOUS | Status: AC
  Administered 2022-10-27: 20 mg via INTRAVENOUS
  Filled 2022-10-27: qty 50

## 2022-10-27 MED ORDER — DEXTROSE 5 % IV SOLN
56.0000 mg/m2 | Freq: Once | INTRAVENOUS | Status: AC
  Administered 2022-10-27: 100 mg via INTRAVENOUS
  Filled 2022-10-27: qty 30

## 2022-10-27 MED ORDER — SODIUM CHLORIDE 0.9 % IV SOLN: Freq: Once | INTRAVENOUS | Status: DC

## 2022-10-27 MED ORDER — HEPARIN SOD (PORK) LOCK FLUSH 100 UNIT/ML IV SOLN: 500.0000 [IU] | Freq: Once | INTRAVENOUS | Status: DC | PRN

## 2022-10-27 MED ORDER — ACETAMINOPHEN 325 MG PO TABS
650.0000 mg | ORAL_TABLET | Freq: Once | ORAL | Status: AC
  Administered 2022-10-27: 650 mg via ORAL
  Filled 2022-10-27: qty 2

## 2022-10-27 MED ORDER — LENALIDOMIDE 10 MG PO CAPS: 10.0000 mg | ORAL_CAPSULE | Freq: Every day | ORAL | 0 refills | Status: DC

## 2022-10-27 MED ORDER — SODIUM CHLORIDE 0.9% FLUSH: 10.0000 mL | INTRAVENOUS | Status: DC | PRN

## 2022-10-27 NOTE — Progress Notes (Signed)
Patient seen by / was  Not seen today  Vitals are within treatment parameters.  Labs reviewed: ,"and are not all within treatment parameters.    Per physician team, patient is ready for treatment and there are NO modifications to the treatment plan.

## 2022-10-27 NOTE — Patient Instructions (Addendum)
[facility] ONCOLOGY  Discharge Instructions: Thank you for choosing [facility] to provide your oncology and hematology care.   If you have a lab appointment with the [facility], please go directly to the [facility] and check in at the registration area.   Wear comfortable clothing and clothing appropriate for easy access to any Portacath or PICC line.   We strive to give you quality time with your provider. You may need to reschedule your appointment if you arrive late (15 or more minutes).  Arriving late affects you and other patients whose appointments are after yours.  Also, if you miss three or more appointments without notifying the office, you may be dismissed from the clinic at the provider's discretion.      For prescription refill requests, have your pharmacy contact our office and allow 72 hours for refills to be completed.    Today you received the following chemotherapy and/or immunotherapy agents Kyprolis/carfilzumab      To help prevent nausea and vomiting after your treatment, we encourage you to take your nausea medication as directed.  BELOW ARE SYMPTOMS THAT SHOULD BE REPORTED IMMEDIATELY: *FEVER GREATER THAN 100.4 F (38 C) OR HIGHER *CHILLS OR SWEATING *NAUSEA AND VOMITING THAT IS NOT CONTROLLED WITH YOUR NAUSEA MEDICATION *UNUSUAL SHORTNESS OF BREATH *UNUSUAL BRUISING OR BLEEDING *URINARY PROBLEMS (pain or burning when urinating, or frequent urination) *BOWEL PROBLEMS (unusual diarrhea, constipation, pain near the anus) TENDERNESS IN MOUTH AND THROAT WITH OR WITHOUT PRESENCE OF ULCERS (sore throat, sores in mouth, or a toothache) UNUSUAL RASH, SWELLING OR PAIN  UNUSUAL VAGINAL DISCHARGE OR ITCHING   Items with * indicate a potential emergency and should be followed up as soon as possible or go to the Emergency Department if any problems should occur.  Please show the CHEMOTHERAPY ALERT CARD or IMMUNOTHERAPY ALERT CARD at  check-in to the Emergency Department and triage nurse.  Should you have questions after your visit or need to cancel or reschedule your appointment, please contact [facility]  Dept: [phone]  and follow the prompts.  Office hours are 8:00 a.m. to 4:30 p.m. Monday - Friday. Please note that voicemails left after 4:00 p.m. may not be returned until the following business day.  We are closed weekends and major holidays. You have access to a nurse at all times for urgent questions. Please call the main number to the clinic Dept: [phone] and follow the prompts.   For any non-urgent questions, you may also contact your provider using MyChart. We now offer e-Visits for anyone 85 and older to request care online for non-urgent symptoms. For details visit [email or web address].   Also download the MyChart app! Go to the app store, search "MyChart", open the app, select [facility], and log in with your MyChart username and password.  Masks are optional in the cancer centers. If you would like for your care team to wear a mask while they are taking care of you, please let them know. You may have one support person who is at least 65 years old accompany you for your appointments.

## 2022-10-28 ENCOUNTER — Other Ambulatory Visit: Payer: Self-pay

## 2022-10-31 ENCOUNTER — Other Ambulatory Visit: Payer: Self-pay

## 2022-11-02 ENCOUNTER — Other Ambulatory Visit: Payer: Self-pay

## 2022-11-10 ENCOUNTER — Inpatient Hospital Stay: Payer: Medicare Other

## 2022-11-10 ENCOUNTER — Other Ambulatory Visit: Payer: Self-pay

## 2022-11-10 ENCOUNTER — Inpatient Hospital Stay: Payer: Medicare Other | Attending: Hematology

## 2022-11-10 VITALS — BP 143/88 | HR 80 | Temp 98.1°F | Resp 18 | Wt 161.5 lb

## 2022-11-10 DIAGNOSIS — Z95828 Presence of other vascular implants and grafts: Secondary | ICD-10-CM

## 2022-11-10 DIAGNOSIS — Z5111 Encounter for antineoplastic chemotherapy: Secondary | ICD-10-CM | POA: Diagnosis present

## 2022-11-10 DIAGNOSIS — C9010 Plasma cell leukemia not having achieved remission: Secondary | ICD-10-CM

## 2022-11-10 DIAGNOSIS — C9001 Multiple myeloma in remission: Secondary | ICD-10-CM | POA: Insufficient documentation

## 2022-11-10 DIAGNOSIS — Z7189 Other specified counseling: Secondary | ICD-10-CM

## 2022-11-10 DIAGNOSIS — M84551A Pathological fracture in neoplastic disease, right femur, initial encounter for fracture: Secondary | ICD-10-CM

## 2022-11-10 DIAGNOSIS — C9011 Plasma cell leukemia in remission: Secondary | ICD-10-CM | POA: Diagnosis not present

## 2022-11-10 LAB — CMP (CANCER CENTER ONLY)
ALT: 11 U/L (ref 0–44)
AST: 12 U/L — ABNORMAL LOW (ref 15–41)
Albumin: 4.3 g/dL (ref 3.5–5.0)
Alkaline Phosphatase: 56 U/L (ref 38–126)
Anion gap: 6 (ref 5–15)
BUN: 31 mg/dL — ABNORMAL HIGH (ref 8–23)
CO2: 25 mmol/L (ref 22–32)
Calcium: 9.2 mg/dL (ref 8.9–10.3)
Chloride: 108 mmol/L (ref 98–111)
Creatinine: 1.59 mg/dL — ABNORMAL HIGH (ref 0.61–1.24)
GFR, Estimated: 48 mL/min — ABNORMAL LOW (ref >60)
Glucose, Bld: 86 mg/dL (ref 70–99)
Potassium: 4.1 mmol/L (ref 3.5–5.1)
Sodium: 139 mmol/L (ref 135–145)
Total Bilirubin: 0.5 mg/dL (ref 0.3–1.2)
Total Protein: 7.1 g/dL (ref 6.5–8.1)

## 2022-11-10 LAB — CBC WITH DIFFERENTIAL (CANCER CENTER ONLY)
Abs Immature Granulocytes: 0.03 10*3/uL (ref 0.00–0.07)
Basophils Absolute: 0.1 10*3/uL (ref 0.0–0.1)
Basophils Relative: 2 %
Eosinophils Absolute: 0.2 10*3/uL (ref 0.0–0.5)
Eosinophils Relative: 4 %
HCT: 35.3 % — ABNORMAL LOW (ref 39.0–52.0)
Hemoglobin: 12 g/dL — ABNORMAL LOW (ref 13.0–17.0)
Immature Granulocytes: 1 %
Lymphocytes Relative: 34 %
Lymphs Abs: 2.1 10*3/uL (ref 0.7–4.0)
MCH: 31 pg (ref 26.0–34.0)
MCHC: 34 g/dL (ref 30.0–36.0)
MCV: 91.2 fL (ref 80.0–100.0)
Monocytes Absolute: 0.8 10*3/uL (ref 0.1–1.0)
Monocytes Relative: 13 %
Neutro Abs: 2.9 10*3/uL (ref 1.7–7.7)
Neutrophils Relative %: 46 %
Platelet Count: 246 10*3/uL (ref 150–400)
RBC: 3.87 MIL/uL — ABNORMAL LOW (ref 4.22–5.81)
RDW: 15.6 % — ABNORMAL HIGH (ref 11.5–15.5)
WBC Count: 6.1 10*3/uL (ref 4.0–10.5)
nRBC: 0 % (ref 0.0–0.2)

## 2022-11-10 MED ORDER — ACETAMINOPHEN 325 MG PO TABS
650.0000 mg | ORAL_TABLET | Freq: Once | ORAL | Status: AC
  Administered 2022-11-10: 650 mg via ORAL
  Filled 2022-11-10: qty 2

## 2022-11-10 MED ORDER — FAMOTIDINE IN NACL 20-0.9 MG/50ML-% IV SOLN
20.0000 mg | Freq: Once | INTRAVENOUS | Status: AC
  Administered 2022-11-10: 20 mg via INTRAVENOUS
  Filled 2022-11-10: qty 50

## 2022-11-10 MED ORDER — DEXTROSE 5 % IV SOLN
56.0000 mg/m2 | Freq: Once | INTRAVENOUS | Status: AC
  Administered 2022-11-10: 100 mg via INTRAVENOUS
  Filled 2022-11-10: qty 30

## 2022-11-10 MED ORDER — SODIUM CHLORIDE 0.9% FLUSH
10.0000 mL | Freq: Once | INTRAVENOUS | Status: AC
  Administered 2022-11-10: 10 mL

## 2022-11-10 MED ORDER — SODIUM CHLORIDE 0.9% FLUSH
10.0000 mL | INTRAVENOUS | Status: DC | PRN
  Administered 2022-11-10: 10 mL

## 2022-11-10 MED ORDER — SODIUM CHLORIDE 0.9 % IV SOLN
12.0000 mg | Freq: Once | INTRAVENOUS | Status: AC
  Administered 2022-11-10: 12 mg via INTRAVENOUS
  Filled 2022-11-10: qty 1.2

## 2022-11-10 MED ORDER — SODIUM CHLORIDE 0.9 % IV SOLN: Freq: Once | INTRAVENOUS | Status: AC

## 2022-11-10 MED ORDER — HEPARIN SOD (PORK) LOCK FLUSH 100 UNIT/ML IV SOLN
500.0000 [IU] | Freq: Once | INTRAVENOUS | Status: AC | PRN
  Administered 2022-11-10: 500 [IU]

## 2022-11-10 NOTE — Progress Notes (Signed)
Patient   Not seen by MD today  Vitals are within treatment parameters.  Labs reviewed: ,"and are not all within treatment parameters. Cr 1.59 Dr Irene Limbo aware  Per physician team, patient is ready for treatment and there are NO modifications to the treatment plan.

## 2022-11-10 NOTE — Patient Instructions (Signed)
[facility]  Discharge Instructions: Thank you for choosing [facility] to provide your oncology and hematology care.   If you have a lab appointment with the [facility], please go directly to the [facility] and check in at the registration area.   Wear comfortable clothing and clothing appropriate for easy access to any Portacath or PICC line.   We strive to give you quality time with your provider. You may need to reschedule your appointment if you arrive late (15 or more minutes).  Arriving late affects you and other patients whose appointments are after yours.  Also, if you miss three or more appointments without notifying the office, you may be dismissed from the clinic at the provider's discretion.      For prescription refill requests, have your pharmacy contact our office and allow 72 hours for refills to be completed.    Today you received the following chemotherapy and/or immunotherapy agents: Kyprolis.      To help prevent nausea and vomiting after your treatment, we encourage you to take your nausea medication as directed.  BELOW ARE SYMPTOMS THAT SHOULD BE REPORTED IMMEDIATELY: *FEVER GREATER THAN 100.4 F (38 C) OR HIGHER *CHILLS OR SWEATING *NAUSEA AND VOMITING THAT IS NOT CONTROLLED WITH YOUR NAUSEA MEDICATION *UNUSUAL SHORTNESS OF BREATH *UNUSUAL BRUISING OR BLEEDING *URINARY PROBLEMS (pain or burning when urinating, or frequent urination) *BOWEL PROBLEMS (unusual diarrhea, constipation, pain near the anus) TENDERNESS IN MOUTH AND THROAT WITH OR WITHOUT PRESENCE OF ULCERS (sore throat, sores in mouth, or a toothache) UNUSUAL RASH, SWELLING OR PAIN  UNUSUAL VAGINAL DISCHARGE OR ITCHING   Items with * indicate a potential emergency and should be followed up as soon as possible or go to the Emergency Department if any problems should occur.  Please show the CHEMOTHERAPY ALERT CARD or IMMUNOTHERAPY ALERT CARD at check-in to  the Emergency Department and triage nurse.  Should you have questions after your visit or need to cancel or reschedule your appointment, please contact [facility]  Dept: [phone]  and follow the prompts.  Office hours are 8:00 a.m. to 4:30 p.m. Monday - Friday. Please note that voicemails left after 4:00 p.m. may not be returned until the following business day.  We are closed weekends and major holidays. You have access to a nurse at all times for urgent questions. Please call the main number to the clinic Dept: [phone] and follow the prompts.   For any non-urgent questions, you may also contact your provider using MyChart. We now offer e-Visits for anyone 70 and older to request care online for non-urgent symptoms. For details visit [email or web address].   Also download the MyChart app! Go to the app store, search "MyChart", open the app, select [facility], and log in with your MyChart username and password.  Masks are optional in the cancer centers. If you would like for your care team to wear a mask while they are taking care of you, please let them know. You may have one support person who is at least 65 years old accompany you for your appointments. Pamidronate Injection What is this medication? PAMIDRONATE (pa mi DROE nate) treats high calcium levels in the blood caused by cancer. It may also be used with chemotherapy to treat weakened bones caused by cancer. It can also be used to treat Paget's disease of the bone. It works by slowing down the release of calcium from bones. This lowers calcium levels  in your blood. It also makes your bones stronger and less likely to break (fracture). It belongs to a group of medications called bisphosphonates. This medicine may be used for other purposes; ask your health care provider or pharmacist if you have questions. COMMON BRAND NAME(S): Aredia What should I tell my care team before I take this  medication? They need to know if you have any of these conditions: Bleeding disorder Cancer Dental disease Kidney disease Low levels of calcium or other minerals in the blood Low red blood cell counts Receiving steroids, such as dexamethasone or prednisone An unusual or allergic reaction to pamidronate, other medications, foods, dyes or preservatives Pregnant or trying to get pregnant Breast-feeding How should I use this medication? This medication is injected into a vein. It is given by your care team in a hospital or clinic setting. Talk to your care team about the use of this medication in children. Special care may be needed. Overdosage: If you think you have taken too much of this medicine contact a poison control center or emergency room at once. NOTE: This medicine is only for you. Do not share this medicine with others. What if I miss a dose? Keep appointments for follow-up doses. It is important not to miss your dose. Call your care team if you are unable to keep an appointment. What may interact with this medication? Certain antibiotics given by injection Medications for inflammation or pain, such as ibuprofen, naproxen Some diuretics, such as bumetanide, furosemide Cyclosporine Parathyroid hormone Tacrolimus Teriparatide Thalidomide This list may not describe all possible interactions. Give your health care provider a list of all the medicines, herbs, non-prescription drugs, or dietary supplements you use. Also tell them if you smoke, drink alcohol, or use illegal drugs. Some items may interact with your medicine. What should I watch for while using this medication? Visit your care team for regular checks on your progress. It may be some time before you see the benefit from this medication. Some people who take this medication have severe bone, joint, or muscle pain. This medication may also increase your risk for jaw problems or a broken thigh bone. Tell your care team  right away if you have severe pain in your jaw, bones, joints, or muscles. Tell your care team if you have any pain that does not go away or that gets worse. Tell your dentist and dental surgeon that you are taking this medication. You should not have major dental surgery while on this medication. See your dentist to have a dental exam and fix any dental problems before starting this medication. Take good care of your teeth while on this medication. Make sure you see your dentist for regular follow-up appointments. You should make sure you get enough calcium and vitamin D while you are taking this medication. Discuss the foods you eat and the vitamins you take with your care team. You may need bloodwork while you are taking this medication. Talk to your care team if you wish to become pregnant or think you might be pregnant. This medication can cause serious birth defects. What side effects may I notice from receiving this medication? Side effects that you should report to your care team as soon as possible: Allergic reactions--skin rash, itching, hives, swelling of the face, lips, tongue, or throat Kidney injury--decrease in the amount of urine, swelling of the ankles, hands, or feet Low calcium level--muscle pain or cramps, confusion, tingling, or numbness in the hands or feet Osteonecrosis of the  jaw--pain, swelling, or redness in the mouth, numbness of the jaw, poor healing after dental work, unusual discharge from the mouth, visible bones in the mouth Severe bone, joint, or muscle pain Side effects that usually do not require medical attention (report to your care team if they continue or are bothersome): Constipation Fatigue Fever Loss of appetite Nausea Pain, redness, or irritation at injection site Stomach pain This list may not describe all possible side effects. Call your doctor for medical advice about side effects. You may report side effects to FDA at 1-800-FDA-1088. Where should I  keep my medication? This medication is given in a hospital or clinic. It will not be stored at home. NOTE: This sheet is a summary. It may not cover all possible information. If you have questions about this medicine, talk to your doctor, pharmacist, or health care provider.  2023 Elsevier/Gold Standard (2022-01-12 00:00:00)

## 2022-11-14 LAB — MULTIPLE MYELOMA PANEL, SERUM
Albumin SerPl Elph-Mcnc: 3.4 g/dL (ref 2.9–4.4)
Albumin/Glob SerPl: 1.2 (ref 0.7–1.7)
Alpha 1: 0.2 g/dL (ref 0.0–0.4)
Alpha2 Glob SerPl Elph-Mcnc: 1 g/dL (ref 0.4–1.0)
B-Globulin SerPl Elph-Mcnc: 0.9 g/dL (ref 0.7–1.3)
Gamma Glob SerPl Elph-Mcnc: 0.7 g/dL (ref 0.4–1.8)
Globulin, Total: 2.9 g/dL (ref 2.2–3.9)
IgA: 63 mg/dL (ref 61–437)
IgG (Immunoglobin G), Serum: 743 mg/dL (ref 603–1613)
IgM (Immunoglobulin M), Srm: 6 mg/dL — ABNORMAL LOW (ref 20–172)
Total Protein ELP: 6.3 g/dL (ref 6.0–8.5)

## 2022-11-23 ENCOUNTER — Other Ambulatory Visit: Payer: Self-pay

## 2022-11-23 DIAGNOSIS — C9010 Plasma cell leukemia not having achieved remission: Secondary | ICD-10-CM

## 2022-11-23 MED ORDER — LENALIDOMIDE 10 MG PO CAPS: 10.0000 mg | ORAL_CAPSULE | Freq: Every day | ORAL | 0 refills | Status: DC

## 2022-11-24 ENCOUNTER — Other Ambulatory Visit: Payer: Self-pay

## 2022-11-24 ENCOUNTER — Inpatient Hospital Stay: Payer: Medicare Other

## 2022-11-24 ENCOUNTER — Inpatient Hospital Stay: Payer: Medicare Other | Admitting: Physician Assistant

## 2022-11-24 VITALS — BP 120/88 | HR 78 | Temp 97.7°F | Resp 17

## 2022-11-24 VITALS — BP 131/80 | HR 81 | Temp 97.5°F | Resp 17 | Ht 68.0 in | Wt 159.8 lb

## 2022-11-24 DIAGNOSIS — C9010 Plasma cell leukemia not having achieved remission: Secondary | ICD-10-CM | POA: Diagnosis not present

## 2022-11-24 DIAGNOSIS — Z7189 Other specified counseling: Secondary | ICD-10-CM

## 2022-11-24 DIAGNOSIS — M84551A Pathological fracture in neoplastic disease, right femur, initial encounter for fracture: Secondary | ICD-10-CM

## 2022-11-24 DIAGNOSIS — Z5111 Encounter for antineoplastic chemotherapy: Secondary | ICD-10-CM | POA: Diagnosis not present

## 2022-11-24 DIAGNOSIS — Z95828 Presence of other vascular implants and grafts: Secondary | ICD-10-CM

## 2022-11-24 DIAGNOSIS — D508 Other iron deficiency anemias: Secondary | ICD-10-CM

## 2022-11-24 LAB — CMP (CANCER CENTER ONLY)
ALT: 11 U/L (ref 0–44)
AST: 15 U/L (ref 15–41)
Albumin: 4.1 g/dL (ref 3.5–5.0)
Alkaline Phosphatase: 59 U/L (ref 38–126)
Anion gap: 7 (ref 5–15)
BUN: 26 mg/dL — ABNORMAL HIGH (ref 8–23)
CO2: 25 mmol/L (ref 22–32)
Calcium: 9.1 mg/dL (ref 8.9–10.3)
Chloride: 109 mmol/L (ref 98–111)
Creatinine: 1.56 mg/dL — ABNORMAL HIGH (ref 0.61–1.24)
GFR, Estimated: 49 mL/min — ABNORMAL LOW (ref >60)
Glucose, Bld: 103 mg/dL — ABNORMAL HIGH (ref 70–99)
Potassium: 4.3 mmol/L (ref 3.5–5.1)
Sodium: 141 mmol/L (ref 135–145)
Total Bilirubin: 0.4 mg/dL (ref 0.3–1.2)
Total Protein: 6.5 g/dL (ref 6.5–8.1)

## 2022-11-24 LAB — CBC WITH DIFFERENTIAL (CANCER CENTER ONLY)
Abs Immature Granulocytes: 0.02 10*3/uL (ref 0.00–0.07)
Basophils Absolute: 0.1 10*3/uL (ref 0.0–0.1)
Basophils Relative: 2 %
Eosinophils Absolute: 0.4 10*3/uL (ref 0.0–0.5)
Eosinophils Relative: 5 %
HCT: 34.2 % — ABNORMAL LOW (ref 39.0–52.0)
Hemoglobin: 11.9 g/dL — ABNORMAL LOW (ref 13.0–17.0)
Immature Granulocytes: 0 %
Lymphocytes Relative: 31 %
Lymphs Abs: 2.1 10*3/uL (ref 0.7–4.0)
MCH: 31.5 pg (ref 26.0–34.0)
MCHC: 34.8 g/dL (ref 30.0–36.0)
MCV: 90.5 fL (ref 80.0–100.0)
Monocytes Absolute: 0.7 10*3/uL (ref 0.1–1.0)
Monocytes Relative: 10 %
Neutro Abs: 3.6 10*3/uL (ref 1.7–7.7)
Neutrophils Relative %: 52 %
Platelet Count: 234 10*3/uL (ref 150–400)
RBC: 3.78 MIL/uL — ABNORMAL LOW (ref 4.22–5.81)
RDW: 15.3 % (ref 11.5–15.5)
WBC Count: 6.9 10*3/uL (ref 4.0–10.5)
nRBC: 0 % (ref 0.0–0.2)

## 2022-11-24 MED ORDER — SODIUM CHLORIDE 0.9 % IV SOLN: Freq: Once | INTRAVENOUS | Status: AC

## 2022-11-24 MED ORDER — SODIUM CHLORIDE 0.9% FLUSH
10.0000 mL | INTRAVENOUS | Status: DC | PRN
  Administered 2022-11-24: 10 mL

## 2022-11-24 MED ORDER — SODIUM CHLORIDE 0.9 % IV SOLN
12.0000 mg | Freq: Once | INTRAVENOUS | Status: AC
  Administered 2022-11-24: 12 mg via INTRAVENOUS
  Filled 2022-11-24: qty 1.2

## 2022-11-24 MED ORDER — FERROUS SULFATE 325 (65 FE) MG PO TBEC: 325.0000 mg | DELAYED_RELEASE_TABLET | Freq: Every day | ORAL | 3 refills | Status: DC

## 2022-11-24 MED ORDER — ACETAMINOPHEN 325 MG PO TABS
650.0000 mg | ORAL_TABLET | Freq: Once | ORAL | Status: AC
  Administered 2022-11-24: 650 mg via ORAL
  Filled 2022-11-24: qty 2

## 2022-11-24 MED ORDER — DEXTROSE 5 % IV SOLN
56.0000 mg/m2 | Freq: Once | INTRAVENOUS | Status: AC
  Administered 2022-11-24: 100 mg via INTRAVENOUS
  Filled 2022-11-24: qty 30

## 2022-11-24 MED ORDER — SODIUM CHLORIDE 0.9% FLUSH
10.0000 mL | Freq: Once | INTRAVENOUS | Status: AC
  Administered 2022-11-24: 10 mL

## 2022-11-24 MED ORDER — FAMOTIDINE IN NACL 20-0.9 MG/50ML-% IV SOLN
20.0000 mg | Freq: Once | INTRAVENOUS | Status: AC
  Administered 2022-11-24: 20 mg via INTRAVENOUS
  Filled 2022-11-24: qty 50

## 2022-11-24 MED ORDER — HEPARIN SOD (PORK) LOCK FLUSH 100 UNIT/ML IV SOLN
500.0000 [IU] | Freq: Once | INTRAVENOUS | Status: AC | PRN
  Administered 2022-11-24: 500 [IU]

## 2022-11-24 NOTE — Progress Notes (Signed)
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  INTERVAL HISTORY:  Andres Fernandez is here for continued evaluation and management of his multiple myeloma and continued carfilzomib maintenance.  He was last seen by Dr. Alfreda Hammad Limbo on 10/13/2022. He is unaccompanied for this visit.   Andres Fernandez reports his energy levels are fairly stable. He is able to complete his ADLs on his own. He denies any appetite or weight changes. He denies nausea, vomiting or abdominal pain. His bowel habits are unchanged without recurrent episodes of diarrhea or constipation. He denies easy bruising or signs of active bleeding. He denies fevers, chills, night sweats, shortness of breath, chest pain or cough. He has no other complaints.   REVIEW OF SYSTEMS:   10 Point review of Systems was done is negative except as noted above.  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   Tobacco abuse     SURGICAL HISTORY: Past Surgical History:  Procedure Laterality Date   APPENDECTOMY     FEMUR IM NAIL Right 11/20/2017   Procedure: RIGHT FEMORAL INTRAMEDULLARY (IM) NAIL;  Surgeon: Nicholes Stairs, MD;  Location: [facility];  Service: Orthopedics;  Laterality: Right;   IR IMAGING GUIDED PORT INSERTION  08/19/2021    SOCIAL HISTORY: Social History   Socioeconomic History   Marital status: Single    Spouse name: Not on file   Number of children: Not on file   Years of education: Not on file   Highest education level: Not on file  Occupational History   Not on file  Tobacco Use   Smoking status: Every Day    Packs/day: 0.25    Types: Cigarettes    Last attempt to quit: 11/09/2017     Years since quitting: 5.0   Smokeless tobacco: Never   Tobacco comments:    1 cigarette/day as of 09/25/18  Vaping Use   Vaping Use: Never used  Substance and Sexual Activity   Alcohol use: Yes   Drug use: No   Sexual activity: Not on file  Other Topics Concern   Not on file  Social History Narrative   Not on file   Social Determinants of Health   Financial Resource Strain: Not on file  Food Insecurity: Not on file  Transportation Needs: Not on file  Physical Activity: Not on file  Stress: Not on file  Social Connections: Not on file  Intimate Partner Violence: Not on file    FAMILY HISTORY: Family History  Problem Relation Age of Onset   COPD Mother    Liver cancer Mother     ALLERGIES:  has No Known Allergies.  MEDICATIONS:  . Current Outpatient Medications on File Prior to Visit  Medication Sig Dispense Refill   acyclovir (ZOVIRAX) 400 MG tablet TAKE 1 TABLET BY MOUTH EVERY DAY 90 tablet 3   lenalidomide (REVLIMID) 10 MG capsule Take 1 capsule (10 mg total) by mouth daily. Take for 21 days on, 7 days off, repeat every 28 days. 21 capsule 0   Albuterol Sulfate (PROAIR RESPICLICK) 627 (90 Base) MCG/ACT AEPB Inhale into the lungs. (Patient not taking: Reported on 09/01/2022)     amoxicillin-clavulanate (AUGMENTIN) 875-125 MG tablet Take  1 tablet by mouth 2 (two) times daily. (Patient not taking: Reported on 09/01/2022)     aspirin EC 81 MG tablet Take 1 tablet (81 mg total) by mouth daily. (Patient not taking: Reported on 11/24/2022) 60 tablet 11   azithromycin (ZITHROMAX) 250 MG tablet 2 tab (557m) for 1 days and then 1 tab (25107m daily for 4 days (Patient not taking: Reported on 11/24/2022) 6 each 0   cholecalciferol (VITAMIN D3) 10 MCG (400 UNIT) TABS tablet Take 1 tablet (400 Units total) by mouth at bedtime. (Patient not taking: Reported on 11/24/2022) 30 tablet 3   FLOVENT DISKUS 250 MCG/ACT AEPB Inhale into the lungs. (Patient not taking: Reported on  09/01/2022)     ipratropium-albuterol (DUONEB) 0.5-2.5 (3) MG/3ML SOLN Inhale into the lungs. (Patient not taking: Reported on 09/01/2022)     lidocaine-prilocaine (EMLA) cream Apply 1 application topically as needed. Apply to Port-A-Cath at least 1 hr prior to treatment. (Patient not taking: Reported on 11/24/2022) 30 g 0   Multiple Vitamins-Minerals (MULTIVITAMIN WITH MINERALS) tablet Take 1 tablet by mouth at bedtime.  (Patient not taking: Reported on 11/24/2022)     nicotine (NICODERM CQ - DOSED IN MG/24 HOURS) 14 mg/24hr patch PLACE 1 PATCH (14 MG TOTAL) ONTO THE SKIN DAILY. (Patient not taking: Reported on 11/24/2022) 28 patch 1   ondansetron (ZOFRAN) 4 MG tablet Take 2 tablets (8 mg total) by mouth every 8 (eight) hours as needed for nausea. (Patient not taking: Reported on 09/01/2022) 30 tablet 0   predniSONE (DELTASONE) 20 MG tablet Take 2 tablets (40 mg total) by mouth daily with breakfast. For the next four days (Patient not taking: Reported on 09/01/2022) 8 tablet 0   Vitamin D, Ergocalciferol, (DRISDOL) 1.25 MG (50000 UNIT) CAPS capsule Take 1 capsule (50,000 Units total) by mouth once a week. (Patient not taking: Reported on 11/24/2022) 12 capsule 6   Current Facility-Administered Medications on File Prior to Visit  Medication Dose Route Frequency Provider Last Rate Last Admin   acetaminophen (TYLENOL) 325 MG tablet            famotidine (PEPCID) 20 MG tablet             PHYSICAL EXAMINATION:  ECOG FS:1 - Symptomatic but completely ambulatory Vitals:   11/24/22 1043  BP: 131/80  Pulse: 81  Resp: 17  Temp: (!) 97.5 F (36.4 C)  SpO2: 100%    Wt Readings from Last 3 Encounters:  11/24/22 159 lb 12.8 oz (72.5 kg)  11/10/22 161 lb 8 oz (73.3 kg)  10/27/22 158 lb 0.8 oz (71.7 kg)  NAD GENERAL:alert, in no acute distress and comfortable SKIN: no acute rashes, no significant lesions EYES: conjunctiva are pink and non-injected, sclera anicteric LYMPH:  no palpable lymphadenopathy  in the cervical or supraclavicular regions LUNGS: clear to auscultation b/l with normal respiratory effort HEART: regular rate & rhythm Extremity: no pedal edema PSYCH: alert & oriented x 3 with fluent speech NEURO: no focal motor/sensory deficits   LABORATORY DATA:  I have reviewed the data as listed     Latest Ref Rng & Units 11/24/2022   10:00 AM 11/10/2022    8:54 AM 10/27/2022    9:16 AM  CBC  WBC 4.0 - 10.5 K/uL 6.9  6.1  6.7   Hemoglobin 13.0 - 17.0 g/dL 11.9  12.0  12.1   Hematocrit 39.0 - 52.0 % 34.2  35.3  34.7   Platelets 150 - 400 K/uL 234  246  233    CBC    Component Value Date/Time   WBC 6.9 11/24/2022 1000   WBC 10.1 08/23/2022 0835   RBC 3.78 (L) 11/24/2022 1000   HGB 11.9 (L) 11/24/2022 1000   HGB 8.3 (L) 12/14/2017 1230   HCT 34.2 (L) 11/24/2022 1000   HCT 23.9 (L) 12/14/2017 1230   PLT 234 11/24/2022 1000   PLT 524 (H) 12/14/2017 1230   MCV 90.5 11/24/2022 1000   MCV 88.4 12/14/2017 1230   MCH 31.5 11/24/2022 1000   MCHC 34.8 11/24/2022 1000   RDW 15.3 11/24/2022 1000   RDW 15.3 (H) 12/14/2017 1230   LYMPHSABS 2.1 11/24/2022 1000   LYMPHSABS 1.4 12/14/2017 1230   MONOABS 0.7 11/24/2022 1000   MONOABS 0.6 12/14/2017 1230   EOSABS 0.4 11/24/2022 1000   EOSABS 0.1 12/14/2017 1230   BASOSABS 0.1 11/24/2022 1000   BASOSABS 0.1 12/14/2017 1230        Latest Ref Rng & Units 11/10/2022    8:54 AM 10/27/2022    9:16 AM 10/13/2022    8:58 AM  CMP  Glucose 70 - 99 mg/dL 86  115  93   BUN 8 - 23 mg/dL _0 Creatinine 0.61 - 1.24 mg/dL 1.59  1.60  1.60   Sodium 135 - 145 mmol/L 139  141  140   Potassium 3.5 - 5.1 mmol/L 4.1  4.1  4.4   Chloride 98 - 111 mmol/L 108  109  109   CO2 22 - 32 mmol/L _1 Calcium 8.9 - 10.3 mg/dL 9.2  9.3  9.3   Total Protein 6.5 - 8.1 g/dL 7.1  7.2  7.1   Total Bilirubin 0.3 - 1.2 mg/dL 0.5  0.4  0.4   Alkaline Phos 38 - 126 U/L 56  61  72   AST 15 - 41 U/L _2 ALT 0 - 44 U/L _3 03/09/20 ([id number]) Bone Marrow Biopsy at [facility]   07/02/2019 Bone Marrow Biopsy ([id number]) at [facility]    08/07/18 BM Bx:        RADIOGRAPHIC STUDIES: I have personally reviewed the radiological images as listed and agreed with the findings in the report. No results found.  ASSESSMENT & PLAN:  Andres Fernandez is a 65 y.o. male presenting to the clinic for a follow up for multiple myeloma.    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 - Initial BM Bx showed >95% involvement with kappa restricted serum free light chains. 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 MoL Cy-   -08/07/18 BM Bx revealed normocellular bone marrow with trilineage hematopoiesis and 1% plasma cells which indicates the pt is in remission 08/06/18 PET/CT revealed  Expansile lytic lesion involving the left iliac bone, unchanged. Lytic lesion involving the sternum, unchanged. While both are suspicious for myeloma, neither lesion is FDG avid. No hypermetabolic osseous lesions in the visualized axial and appendicular skeleton. Please note that the bilateral lower extremities were not imaged. No suspicious lymphadenopathy. Spleen is normal in size.   -Pt began Nin[place]o 80m maintenance after pt finished 13 cycles of CyBorD and  BM bx showed only 1% plasma cells, then resumed CyBorD and held [facility] in anticipation of his tandem bone marrow transplants beginning in late December 2019 with Dr. Blinda Leatherwood at [facility]  -11/19/18 High dose chemotherapy with Melphalan 120m/m2 and autologous stem cell rescue -03/20/19 High dose chemotherapy with Melphalan 2036mm2 and autologous stem cell rescue. Non-infectious diarrhea, uncomplicated  course otherwise. -07/02/2019 PET scan with results revealing "1. No FDG avid bone lesions are identified. 2.  Asymmetric uptake within the right, greater than left, palatine tonsils with mild fullness of the right tonsillar soft tissue. ENT consult vs attention on follow up suggested. 3.  Innumerable mixed sclerotic and lytic lesions throughout the axial and appendicular skeleton without associated hypermetabolic activity. 4.  Ancillary CT findings as above." -07/02/2019 bone marrow biopsy ([id number])with results showing: "Trilineage hematopoieses with maturation. No plasma cell myeloma identified. Mild anemia and No Rouleaux or circulating plasma cells identified in the peripheral blood."  2) s/p Prophylactic IM nailing for rt femur  -[facility] -patient previously reported improvement and is currently progressively back to full time work.  3) Renal insufficiency --likely related to multiple myeloma -maintain follow up with nephrology referral to Dr CoMarval RegalCNarda Amberidney for continued treatment -Pt completed 8 weeks of maverick for hep c with Dr CoLinus Salmonsn 07/08/18.   4) Normocytic anemia: --Iron panel from 10/30/2022 showed iron deficiency  PLAN: -Reviewed labs from today and adequate for treatment. WBC 6.9, Hgb 11.9, Plt 234, Creatinine stable at 1.56. Normal calcium and LFTs.  -No prohibitive toxicities identified from treatment. Recommend to continue with Carfilzomib q 2 weeks and maintenance Revlimid.  -Continue with ASA for thrombotic prophylaxis. -Start PO ferrous sulfate 325 mg once daily with a source of vitamin C -Continue Aredia every 8 weeks for maintenance. Last dose on 10/13/2022. Next dose due on 12/08/2022.  FOLLOW UP: -Labs and treatment in 2 weeks -Labs, f/u visit with Dr. KaIrene Limbond treatment in 4 weeks  All of the patient's questions were answered with apparent satisfaction. The patient knows to call the clinic with any  problems, questions or concerns.  I have spent a total of 30 minutes minutes of face-to-face and non-face-to-face time, preparing to see the patient, performing a medically appropriate examination, counseling and educating the patient, ordering medications/tests/procedures, documenting clinical information in the electronic health record, and care coordination.   IrDede QueryA-C Dept of Hematology and OnHockingportt [facility]Franc[facility] - [facility]oresvillehone: [phone]

## 2022-11-24 NOTE — Patient Instructions (Signed)
[facility] ONCOLOGY  Discharge Instructions: Thank you for choosing [facility] to provide your oncology and hematology care.   If you have a lab appointment with the [facility], please go directly to the [facility] and check in at the registration area.   Wear comfortable clothing and clothing appropriate for easy access to any Portacath or PICC line.   We strive to give you quality time with your provider. You may need to reschedule your appointment if you arrive late (15 or more minutes).  Arriving late affects you and other patients whose appointments are after yours.  Also, if you miss three or more appointments without notifying the office, you may be dismissed from the clinic at the provider's discretion.      For prescription refill requests, have your pharmacy contact our office and allow 72 hours for refills to be completed.    Today you received the following chemotherapy and/or immunotherapy agents: Kyprolis.      To help prevent nausea and vomiting after your treatment, we encourage you to take your nausea medication as directed.  BELOW ARE SYMPTOMS THAT SHOULD BE REPORTED IMMEDIATELY: *FEVER GREATER THAN 100.4 F (38 C) OR HIGHER *CHILLS OR SWEATING *NAUSEA AND VOMITING THAT IS NOT CONTROLLED WITH YOUR NAUSEA MEDICATION *UNUSUAL SHORTNESS OF BREATH *UNUSUAL BRUISING OR BLEEDING *URINARY PROBLEMS (pain or burning when urinating, or frequent urination) *BOWEL PROBLEMS (unusual diarrhea, constipation, pain near the anus) TENDERNESS IN MOUTH AND THROAT WITH OR WITHOUT PRESENCE OF ULCERS (sore throat, sores in mouth, or a toothache) UNUSUAL RASH, SWELLING OR PAIN  UNUSUAL VAGINAL DISCHARGE OR ITCHING   Items with * indicate a potential emergency and should be followed up as soon as possible or go to the Emergency Department if any problems should occur.  Please show the CHEMOTHERAPY ALERT CARD or IMMUNOTHERAPY ALERT CARD at check-in to  the Emergency Department and triage nurse.  Should you have questions after your visit or need to cancel or reschedule your appointment, please contact [facility]  Dept: [phone]  and follow the prompts.  Office hours are 8:00 a.m. to 4:30 p.m. Monday - Friday. Please note that voicemails left after 4:00 p.m. may not be returned until the following business day.  We are closed weekends and major holidays. You have access to a nurse at all times for urgent questions. Please call the main number to the clinic Dept: [phone] and follow the prompts.   For any non-urgent questions, you may also contact your provider using MyChart. We now offer e-Visits for anyone 18 and older to request care online for non-urgent symptoms. For details visit [email or web address].   Also download the MyChart app! Go to the app store, search "MyChart", open the app, select [facility], and log in with your MyChart username and password.  Masks are optional in the cancer centers. If you would like for your care team to wear a mask while they are taking care of you, please let them know. You may have one support person who is at least 65 years old accompany you for your appointments. Pamidronate Injection What is this medication? PAMIDRONATE (pa mi DROE nate) treats high calcium levels in the blood caused by cancer. It may also be used with chemotherapy to treat weakened bones caused by cancer. It can also be used to treat Paget's disease of the bone. It works by slowing down the release of calcium from bones. This lowers calcium levels  in your blood. It also makes your bones stronger and less likely to break (fracture). It belongs to a group of medications called bisphosphonates. This medicine may be used for other purposes; ask your health care provider or pharmacist if you have questions. COMMON BRAND NAME(S): Aredia What should I tell my care team before I take this  medication? They need to know if you have any of these conditions: Bleeding disorder Cancer Dental disease Kidney disease Low levels of calcium or other minerals in the blood Low red blood cell counts Receiving steroids, such as dexamethasone or prednisone An unusual or allergic reaction to pamidronate, other medications, foods, dyes or preservatives Pregnant or trying to get pregnant Breast-feeding How should I use this medication? This medication is injected into a vein. It is given by your care team in a hospital or clinic setting. Talk to your care team about the use of this medication in children. Special care may be needed. Overdosage: If you think you have taken too much of this medicine contact a poison control center or emergency room at once. NOTE: This medicine is only for you. Do not share this medicine with others. What if I miss a dose? Keep appointments for follow-up doses. It is important not to miss your dose. Call your care team if you are unable to keep an appointment. What may interact with this medication? Certain antibiotics given by injection Medications for inflammation or pain, such as ibuprofen, naproxen Some diuretics, such as bumetanide, furosemide Cyclosporine Parathyroid hormone Tacrolimus Teriparatide Thalidomide This list may not describe all possible interactions. Give your health care provider a list of all the medicines, herbs, non-prescription drugs, or dietary supplements you use. Also tell them if you smoke, drink alcohol, or use illegal drugs. Some items may interact with your medicine. What should I watch for while using this medication? Visit your care team for regular checks on your progress. It may be some time before you see the benefit from this medication. Some people who take this medication have severe bone, joint, or muscle pain. This medication may also increase your risk for jaw problems or a broken thigh bone. Tell your care team  right away if you have severe pain in your jaw, bones, joints, or muscles. Tell your care team if you have any pain that does not go away or that gets worse. Tell your dentist and dental surgeon that you are taking this medication. You should not have major dental surgery while on this medication. See your dentist to have a dental exam and fix any dental problems before starting this medication. Take good care of your teeth while on this medication. Make sure you see your dentist for regular follow-up appointments. You should make sure you get enough calcium and vitamin D while you are taking this medication. Discuss the foods you eat and the vitamins you take with your care team. You may need bloodwork while you are taking this medication. Talk to your care team if you wish to become pregnant or think you might be pregnant. This medication can cause serious birth defects. What side effects may I notice from receiving this medication? Side effects that you should report to your care team as soon as possible: Allergic reactions--skin rash, itching, hives, swelling of the face, lips, tongue, or throat Kidney injury--decrease in the amount of urine, swelling of the ankles, hands, or feet Low calcium level--muscle pain or cramps, confusion, tingling, or numbness in the hands or feet Osteonecrosis of the  jaw--pain, swelling, or redness in the mouth, numbness of the jaw, poor healing after dental work, unusual discharge from the mouth, visible bones in the mouth Severe bone, joint, or muscle pain Side effects that usually do not require medical attention (report to your care team if they continue or are bothersome): Constipation Fatigue Fever Loss of appetite Nausea Pain, redness, or irritation at injection site Stomach pain This list may not describe all possible side effects. Call your doctor for medical advice about side effects. You may report side effects to FDA at 1-800-FDA-1088. Where should I  keep my medication? This medication is given in a hospital or clinic. It will not be stored at home. NOTE: This sheet is a summary. It may not cover all possible information. If you have questions about this medicine, talk to your doctor, pharmacist, or health care provider.  2023 Elsevier/Gold Standard (2022-01-12 00:00:00)

## 2022-12-08 ENCOUNTER — Inpatient Hospital Stay: Payer: Medicare Other

## 2022-12-08 ENCOUNTER — Other Ambulatory Visit: Payer: Self-pay

## 2022-12-08 VITALS — BP 134/82 | HR 84 | Temp 98.2°F | Resp 17

## 2022-12-08 DIAGNOSIS — Z95828 Presence of other vascular implants and grafts: Secondary | ICD-10-CM

## 2022-12-08 DIAGNOSIS — Z7189 Other specified counseling: Secondary | ICD-10-CM

## 2022-12-08 DIAGNOSIS — M84551A Pathological fracture in neoplastic disease, right femur, initial encounter for fracture: Secondary | ICD-10-CM

## 2022-12-08 DIAGNOSIS — C9010 Plasma cell leukemia not having achieved remission: Secondary | ICD-10-CM

## 2022-12-08 DIAGNOSIS — Z5111 Encounter for antineoplastic chemotherapy: Secondary | ICD-10-CM | POA: Diagnosis not present

## 2022-12-08 LAB — CBC WITH DIFFERENTIAL (CANCER CENTER ONLY)
Abs Immature Granulocytes: 0.02 10*3/uL (ref 0.00–0.07)
Basophils Absolute: 0.2 10*3/uL — ABNORMAL HIGH (ref 0.0–0.1)
Basophils Relative: 2 %
Eosinophils Absolute: 0.4 10*3/uL (ref 0.0–0.5)
Eosinophils Relative: 5 %
HCT: 36.4 % — ABNORMAL LOW (ref 39.0–52.0)
Hemoglobin: 12.7 g/dL — ABNORMAL LOW (ref 13.0–17.0)
Immature Granulocytes: 0 %
Lymphocytes Relative: 28 %
Lymphs Abs: 2 10*3/uL (ref 0.7–4.0)
MCH: 31.4 pg (ref 26.0–34.0)
MCHC: 34.9 g/dL (ref 30.0–36.0)
MCV: 89.9 fL (ref 80.0–100.0)
Monocytes Absolute: 1 10*3/uL (ref 0.1–1.0)
Monocytes Relative: 14 %
Neutro Abs: 3.5 10*3/uL (ref 1.7–7.7)
Neutrophils Relative %: 51 %
Platelet Count: 222 10*3/uL (ref 150–400)
RBC: 4.05 MIL/uL — ABNORMAL LOW (ref 4.22–5.81)
RDW: 15.9 % — ABNORMAL HIGH (ref 11.5–15.5)
WBC Count: 7 10*3/uL (ref 4.0–10.5)
nRBC: 0 % (ref 0.0–0.2)

## 2022-12-08 LAB — CMP (CANCER CENTER ONLY)
ALT: 11 U/L (ref 0–44)
AST: 12 U/L — ABNORMAL LOW (ref 15–41)
Albumin: 4.1 g/dL (ref 3.5–5.0)
Alkaline Phosphatase: 57 U/L (ref 38–126)
Anion gap: 8 (ref 5–15)
BUN: 24 mg/dL — ABNORMAL HIGH (ref 8–23)
CO2: 25 mmol/L (ref 22–32)
Calcium: 9.3 mg/dL (ref 8.9–10.3)
Chloride: 107 mmol/L (ref 98–111)
Creatinine: 1.46 mg/dL — ABNORMAL HIGH (ref 0.61–1.24)
GFR, Estimated: 53 mL/min — ABNORMAL LOW (ref >60)
Glucose, Bld: 90 mg/dL (ref 70–99)
Potassium: 4.1 mmol/L (ref 3.5–5.1)
Sodium: 140 mmol/L (ref 135–145)
Total Bilirubin: 0.5 mg/dL (ref 0.3–1.2)
Total Protein: 7.1 g/dL (ref 6.5–8.1)

## 2022-12-08 MED ORDER — SODIUM CHLORIDE 0.9 % IV SOLN
12.0000 mg | Freq: Once | INTRAVENOUS | Status: AC
  Administered 2022-12-08: 12 mg via INTRAVENOUS
  Filled 2022-12-08: qty 1.2

## 2022-12-08 MED ORDER — ACETAMINOPHEN 325 MG PO TABS
650.0000 mg | ORAL_TABLET | Freq: Once | ORAL | Status: AC
  Administered 2022-12-08: 650 mg via ORAL
  Filled 2022-12-08: qty 2

## 2022-12-08 MED ORDER — SODIUM CHLORIDE 0.9% FLUSH
10.0000 mL | INTRAVENOUS | Status: DC | PRN
  Administered 2022-12-08: 10 mL

## 2022-12-08 MED ORDER — DEXTROSE 5 % IV SOLN
56.0000 mg/m2 | Freq: Once | INTRAVENOUS | Status: AC
  Administered 2022-12-08: 100 mg via INTRAVENOUS
  Filled 2022-12-08: qty 30

## 2022-12-08 MED ORDER — SODIUM CHLORIDE 0.9 % IV SOLN
60.0000 mg | Freq: Once | INTRAVENOUS | Status: DC
  Filled 2022-12-08: qty 10

## 2022-12-08 MED ORDER — SODIUM CHLORIDE 0.9 % IV SOLN: Freq: Once | INTRAVENOUS | Status: AC

## 2022-12-08 MED ORDER — HEPARIN SOD (PORK) LOCK FLUSH 100 UNIT/ML IV SOLN
500.0000 [IU] | Freq: Once | INTRAVENOUS | Status: AC | PRN
  Administered 2022-12-08: 500 [IU]

## 2022-12-08 MED ORDER — FAMOTIDINE IN NACL 20-0.9 MG/50ML-% IV SOLN
20.0000 mg | Freq: Once | INTRAVENOUS | Status: AC
  Administered 2022-12-08: 20 mg via INTRAVENOUS
  Filled 2022-12-08: qty 50

## 2022-12-08 MED ORDER — SODIUM CHLORIDE 0.9% FLUSH
10.0000 mL | Freq: Once | INTRAVENOUS | Status: AC
  Administered 2022-12-08: 10 mL

## 2022-12-08 MED ORDER — SODIUM CHLORIDE 0.9 % IV SOLN
60.0000 mg | Freq: Once | INTRAVENOUS | Status: AC
  Administered 2022-12-08: 60 mg via INTRAVENOUS
  Filled 2022-12-08: qty 20

## 2022-12-08 NOTE — Patient Instructions (Addendum)
[facility] ONCOLOGY  Discharge Instructions: Thank you for choosing [facility] to provide your oncology and hematology care.   If you have a lab appointment with the [facility], please go directly to the [facility] and check in at the registration area.   Wear comfortable clothing and clothing appropriate for easy access to any Portacath or PICC line.   We strive to give you quality time with your provider. You may need to reschedule your appointment if you arrive late (15 or more minutes).  Arriving late affects you and other patients whose appointments are after yours.  Also, if you miss three or more appointments without notifying the office, you may be dismissed from the clinic at the provider's discretion.      For prescription refill requests, have your pharmacy contact our office and allow 72 hours for refills to be completed.    Today you received the following chemotherapy and/or immunotherapy agents : Kyprolis      To help prevent nausea and vomiting after your treatment, we encourage you to take your nausea medication as directed.  BELOW ARE SYMPTOMS THAT SHOULD BE REPORTED IMMEDIATELY: *FEVER GREATER THAN 100.4 F (38 C) OR HIGHER *CHILLS OR SWEATING *NAUSEA AND VOMITING THAT IS NOT CONTROLLED WITH YOUR NAUSEA MEDICATION *UNUSUAL SHORTNESS OF BREATH *UNUSUAL BRUISING OR BLEEDING *URINARY PROBLEMS (pain or burning when urinating, or frequent urination) *BOWEL PROBLEMS (unusual diarrhea, constipation, pain near the anus) TENDERNESS IN MOUTH AND THROAT WITH OR WITHOUT PRESENCE OF ULCERS (sore throat, sores in mouth, or a toothache) UNUSUAL RASH, SWELLING OR PAIN  UNUSUAL VAGINAL DISCHARGE OR ITCHING   Items with * indicate a potential emergency and should be followed up as soon as possible or go to the Emergency Department if any problems should occur.  Please show the CHEMOTHERAPY ALERT CARD or IMMUNOTHERAPY ALERT CARD at check-in to  the Emergency Department and triage nurse.  Should you have questions after your visit or need to cancel or reschedule your appointment, please contact [facility]  Dept: [phone]  and follow the prompts.  Office hours are 8:00 a.m. to 4:30 p.m. Monday - Friday. Please note that voicemails left after 4:00 p.m. may not be returned until the following business day.  We are closed weekends and major holidays. You have access to a nurse at all times for urgent questions. Please call the main number to the clinic Dept: [phone] and follow the prompts.   For any non-urgent questions, you may also contact your provider using MyChart. We now offer e-Visits for anyone 45 and older to request care online for non-urgent symptoms. For details visit [email or web address].   Also download the MyChart app! Go to the app store, search "MyChart", open the app, select [facility], and log in with your MyChart username and password.  Pamidronate Injection What is this medication? PAMIDRONATE (pa mi DROE nate) treats high calcium levels in the blood caused by cancer. It may also be used with chemotherapy to treat weakened bones caused by cancer. It can also be used to treat Paget's disease of the bone. It works by slowing down the release of calcium from bones. This lowers calcium levels in your blood. It also makes your bones stronger and less likely to break (fracture). It belongs to a group of medications called bisphosphonates. This medicine may be used for other purposes; ask your health care provider or pharmacist if you have questions. COMMON BRAND NAME(S): Aredia  What should I tell my care team before I take this medication? They need to know if you have any of these conditions: Bleeding disorder Cancer Dental disease Kidney disease Low levels of calcium or other minerals in the blood Low red blood cell counts Receiving steroids, such as dexamethasone or  prednisone An unusual or allergic reaction to pamidronate, other medications, foods, dyes or preservatives Pregnant or trying to get pregnant Breast-feeding How should I use this medication? This medication is injected into a vein. It is given by your care team in a hospital or clinic setting. Talk to your care team about the use of this medication in children. Special care may be needed. Overdosage: If you think you have taken too much of this medicine contact a poison control center or emergency room at once. NOTE: This medicine is only for you. Do not share this medicine with others. What if I miss a dose? Keep appointments for follow-up doses. It is important not to miss your dose. Call your care team if you are unable to keep an appointment. What may interact with this medication? Certain antibiotics given by injection Medications for inflammation or pain, such as ibuprofen, naproxen Some diuretics, such as bumetanide, furosemide Cyclosporine Parathyroid hormone Tacrolimus Teriparatide Thalidomide This list may not describe all possible interactions. Give your health care provider a list of all the medicines, herbs, non-prescription drugs, or dietary supplements you use. Also tell them if you smoke, drink alcohol, or use illegal drugs. Some items may interact with your medicine. What should I watch for while using this medication? Visit your care team for regular checks on your progress. It may be some time before you see the benefit from this medication. Some people who take this medication have severe bone, joint, or muscle pain. This medication may also increase your risk for jaw problems or a broken thigh bone. Tell your care team right away if you have severe pain in your jaw, bones, joints, or muscles. Tell your care team if you have any pain that does not go away or that gets worse. Tell your dentist and dental surgeon that you are taking this medication. You should not have major  dental surgery while on this medication. See your dentist to have a dental exam and fix any dental problems before starting this medication. Take good care of your teeth while on this medication. Make sure you see your dentist for regular follow-up appointments. You should make sure you get enough calcium and vitamin D while you are taking this medication. Discuss the foods you eat and the vitamins you take with your care team. You may need bloodwork while you are taking this medication. Talk to your care team if you wish to become pregnant or think you might be pregnant. This medication can cause serious birth defects. What side effects may I notice from receiving this medication? Side effects that you should report to your care team as soon as possible: Allergic reactions--skin rash, itching, hives, swelling of the face, lips, tongue, or throat Kidney injury--decrease in the amount of urine, swelling of the ankles, hands, or feet Low calcium level--muscle pain or cramps, confusion, tingling, or numbness in the hands or feet Osteonecrosis of the jaw--pain, swelling, or redness in the mouth, numbness of the jaw, poor healing after dental work, unusual discharge from the mouth, visible bones in the mouth Severe bone, joint, or muscle pain Side effects that usually do not require medical attention (report to your care team if  they continue or are bothersome): Constipation Fatigue Fever Loss of appetite Nausea Pain, redness, or irritation at injection site Stomach pain This list may not describe all possible side effects. Call your doctor for medical advice about side effects. You may report side effects to FDA at 1-800-FDA-1088. Where should I keep my medication? This medication is given in a hospital or clinic. It will not be stored at home. NOTE: This sheet is a summary. It may not cover all possible information. If you have questions about this medicine, talk to your doctor, pharmacist, or  health care provider.  2023 Elsevier/Gold Standard (2022-01-12 00:00:00)

## 2022-12-21 ENCOUNTER — Other Ambulatory Visit: Payer: Self-pay

## 2022-12-21 ENCOUNTER — Encounter: Payer: Self-pay | Admitting: Hematology

## 2022-12-21 DIAGNOSIS — C9010 Plasma cell leukemia not having achieved remission: Secondary | ICD-10-CM

## 2022-12-21 MED ORDER — LENALIDOMIDE 10 MG PO CAPS: 10.0000 mg | ORAL_CAPSULE | Freq: Every day | ORAL | 0 refills | Status: DC

## 2022-12-22 ENCOUNTER — Inpatient Hospital Stay: Payer: Medicare Other | Attending: Hematology | Admitting: Hematology

## 2022-12-22 ENCOUNTER — Inpatient Hospital Stay: Payer: Medicare Other

## 2022-12-22 ENCOUNTER — Other Ambulatory Visit: Payer: Self-pay

## 2022-12-22 VITALS — BP 129/86 | HR 93 | Temp 97.9°F | Resp 18 | Wt 161.7 lb

## 2022-12-22 DIAGNOSIS — C9010 Plasma cell leukemia not having achieved remission: Secondary | ICD-10-CM | POA: Diagnosis not present

## 2022-12-22 DIAGNOSIS — Z5111 Encounter for antineoplastic chemotherapy: Secondary | ICD-10-CM | POA: Insufficient documentation

## 2022-12-22 DIAGNOSIS — C9011 Plasma cell leukemia in remission: Secondary | ICD-10-CM | POA: Diagnosis not present

## 2022-12-22 DIAGNOSIS — M84551A Pathological fracture in neoplastic disease, right femur, initial encounter for fracture: Secondary | ICD-10-CM

## 2022-12-22 DIAGNOSIS — Z7189 Other specified counseling: Secondary | ICD-10-CM

## 2022-12-22 DIAGNOSIS — C9001 Multiple myeloma in remission: Secondary | ICD-10-CM | POA: Diagnosis not present

## 2022-12-22 DIAGNOSIS — Z95828 Presence of other vascular implants and grafts: Secondary | ICD-10-CM

## 2022-12-22 LAB — CMP (CANCER CENTER ONLY)
ALT: 10 U/L (ref 0–44)
AST: 11 U/L — ABNORMAL LOW (ref 15–41)
Albumin: 3.9 g/dL (ref 3.5–5.0)
Alkaline Phosphatase: 60 U/L (ref 38–126)
Anion gap: 6 (ref 5–15)
BUN: 25 mg/dL — ABNORMAL HIGH (ref 8–23)
CO2: 24 mmol/L (ref 22–32)
Calcium: 8.4 mg/dL — ABNORMAL LOW (ref 8.9–10.3)
Chloride: 109 mmol/L (ref 98–111)
Creatinine: 1.52 mg/dL — ABNORMAL HIGH (ref 0.61–1.24)
GFR, Estimated: 51 mL/min — ABNORMAL LOW (ref >60)
Glucose, Bld: 103 mg/dL — ABNORMAL HIGH (ref 70–99)
Potassium: 3.8 mmol/L (ref 3.5–5.1)
Sodium: 139 mmol/L (ref 135–145)
Total Bilirubin: 0.5 mg/dL (ref 0.3–1.2)
Total Protein: 6.6 g/dL (ref 6.5–8.1)

## 2022-12-22 LAB — CBC WITH DIFFERENTIAL (CANCER CENTER ONLY)
Abs Immature Granulocytes: 0.02 10*3/uL (ref 0.00–0.07)
Basophils Absolute: 0.1 10*3/uL (ref 0.0–0.1)
Basophils Relative: 1 %
Eosinophils Absolute: 0.3 10*3/uL (ref 0.0–0.5)
Eosinophils Relative: 5 %
HCT: 36.1 % — ABNORMAL LOW (ref 39.0–52.0)
Hemoglobin: 12.5 g/dL — ABNORMAL LOW (ref 13.0–17.0)
Immature Granulocytes: 0 %
Lymphocytes Relative: 24 %
Lymphs Abs: 1.6 10*3/uL (ref 0.7–4.0)
MCH: 31.5 pg (ref 26.0–34.0)
MCHC: 34.6 g/dL (ref 30.0–36.0)
MCV: 90.9 fL (ref 80.0–100.0)
Monocytes Absolute: 0.8 10*3/uL (ref 0.1–1.0)
Monocytes Relative: 11 %
Neutro Abs: 3.9 10*3/uL (ref 1.7–7.7)
Neutrophils Relative %: 59 %
Platelet Count: 207 10*3/uL (ref 150–400)
RBC: 3.97 MIL/uL — ABNORMAL LOW (ref 4.22–5.81)
RDW: 16.1 % — ABNORMAL HIGH (ref 11.5–15.5)
WBC Count: 6.7 10*3/uL (ref 4.0–10.5)
nRBC: 0 % (ref 0.0–0.2)

## 2022-12-22 MED ORDER — FAMOTIDINE IN NACL 20-0.9 MG/50ML-% IV SOLN
20.0000 mg | Freq: Once | INTRAVENOUS | Status: AC
  Administered 2022-12-22: 20 mg via INTRAVENOUS
  Filled 2022-12-22: qty 50

## 2022-12-22 MED ORDER — SODIUM CHLORIDE 0.9 % IV SOLN
12.0000 mg | Freq: Once | INTRAVENOUS | Status: AC
  Administered 2022-12-22: 12 mg via INTRAVENOUS
  Filled 2022-12-22: qty 1.2

## 2022-12-22 MED ORDER — SODIUM CHLORIDE 0.9% FLUSH
10.0000 mL | Freq: Once | INTRAVENOUS | Status: AC
  Administered 2022-12-22: 10 mL

## 2022-12-22 MED ORDER — ACETAMINOPHEN 325 MG PO TABS
650.0000 mg | ORAL_TABLET | Freq: Once | ORAL | Status: AC
  Administered 2022-12-22: 650 mg via ORAL
  Filled 2022-12-22: qty 2

## 2022-12-22 MED ORDER — DEXTROSE 5 % IV SOLN
56.0000 mg/m2 | Freq: Once | INTRAVENOUS | Status: AC
  Administered 2022-12-22: 100 mg via INTRAVENOUS
  Filled 2022-12-22: qty 15

## 2022-12-22 MED ORDER — SODIUM CHLORIDE 0.9 % IV SOLN: Freq: Once | INTRAVENOUS | Status: AC

## 2022-12-22 MED ORDER — SODIUM CHLORIDE 0.9% FLUSH
10.0000 mL | INTRAVENOUS | Status: DC | PRN
  Administered 2022-12-22: 10 mL

## 2022-12-22 MED ORDER — HEPARIN SOD (PORK) LOCK FLUSH 100 UNIT/ML IV SOLN
500.0000 [IU] | Freq: Once | INTRAVENOUS | Status: AC | PRN
  Administered 2022-12-22: 500 [IU]

## 2022-12-22 NOTE — Patient Instructions (Signed)
[facility]  Discharge Instructions: Thank you for choosing [facility] to provide your oncology and hematology care.   If you have a lab appointment with the Cancer Center, please go directly to the Cancer Center and check in at the registration area.   Wear comfortable clothing and clothing appropriate for easy access to any Portacath or PICC line.   We strive to give you quality time with your provider. You may need to reschedule your appointment if you arrive late (15 or more minutes).  Arriving late affects you and other patients whose appointments are after yours.  Also, if you miss three or more appointments without notifying the office, you may be dismissed from the clinic at the provider's discretion.      For prescription refill requests, have your pharmacy contact our office and allow 72 hours for refills to be completed.    Today you received the following chemotherapy and/or immunotherapy agents: Kyprolis      To help prevent nausea and vomiting after your treatment, we encourage you to take your nausea medication as directed.  BELOW ARE SYMPTOMS THAT SHOULD BE REPORTED IMMEDIATELY: *FEVER GREATER THAN 100.4 F (38 C) OR HIGHER *CHILLS OR SWEATING *NAUSEA AND VOMITING THAT IS NOT CONTROLLED WITH YOUR NAUSEA MEDICATION *UNUSUAL SHORTNESS OF BREATH *UNUSUAL BRUISING OR BLEEDING *URINARY PROBLEMS (pain or burning when urinating, or frequent urination) *BOWEL PROBLEMS (unusual diarrhea, constipation, pain near the anus) TENDERNESS IN MOUTH AND THROAT WITH OR WITHOUT PRESENCE OF ULCERS (sore throat, sores in mouth, or a toothache) UNUSUAL RASH, SWELLING OR PAIN  UNUSUAL VAGINAL DISCHARGE OR ITCHING   Items with * indicate a potential emergency and should be followed up as soon as possible or go to the Emergency Department if any problems should occur.  Please show the CHEMOTHERAPY ALERT CARD or IMMUNOTHERAPY ALERT CARD at check-in to  the Emergency Department and triage nurse.  Should you have questions after your visit or need to cancel or reschedule your appointment, please contact [facility]  Dept: [phone]  and follow the prompts.  Office hours are 8:00 a.m. to 4:30 p.m. Monday - Friday. Please note that voicemails left after 4:00 p.m. may not be returned until the following business day.  We are closed weekends and major holidays. You have access to a nurse at all times for urgent questions. Please call the main number to the clinic Dept: [phone] and follow the prompts.   For any non-urgent questions, you may also contact your provider using MyChart. We now offer e-Visits for anyone 18 and older to request care online for non-urgent symptoms. For details visit [email or web address].   Also download the MyChart app! Go to the app store, search "MyChart", open the app, select [facility], and log in with your MyChart username and password.   

## 2022-12-22 NOTE — Progress Notes (Signed)
Patient seen by MD today  Vitals are within treatment parameters.  Labs reviewed: and are within treatment parameters." Dr Irene Limbo aware CR: 1.52/ PT ok for tx today  Per physician team, patient is ready for treatment and there are NO modifications to the treatment plan.

## 2022-12-22 NOTE — Progress Notes (Signed)
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  INTERVAL HISTORY:   Andres Fernandez is here for continued evaluation and management of his multiple myeloma and continued carfilzomib and Revlimid maintenance.    Patient was last seen by me on 10/13/22 and noted that hi respiratory infection had resolved. He was doing well overall with no new medical concerns.  Today, he is doing well overall. However, he complains of intermittent sharp pain on both sides of his neck. He occasionally lifts/push/pulls with work activities. He works 6 days a week and every there Saturday. He is unsure of what brings on his pain, but it may be related to movement. Symptoms began 1-2 months ago and last about 2 seconds. Pain does not radiate to upper extremities. But if he does lift heavy objects, he feels soreness in his arms. No neck tenderness upon touch or looking down. He is able to raise arms with no discomfort.   No new infections. He is UTP on all of his vaccinations, including RSV, influenza, and COVID-19 booster. He is tolerating his current medications.   No respiratory infections since last visit. No new weakness in upper hands or upper extremities. No change in bowel habits or leg swelling. He denies any decline in energy levels. No recent infections.  REVIEW OF SYSTEMS:   10 Point review of Systems was done is negative except as noted above.  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   Tobacco abuse     SURGICAL HISTORY: Past Surgical History:  Procedure Laterality Date   APPENDECTOMY     FEMUR IM NAIL Right 11/20/2017   Procedure: RIGHT FEMORAL  INTRAMEDULLARY (IM) NAIL;  Surgeon: Nicholes Stairs, MD;  Location: [facility];  Service: Orthopedics;  Laterality: Right;   IR IMAGING GUIDED PORT INSERTION  08/19/2021    SOCIAL HISTORY: Social History   Socioeconomic History   Marital status: Single    Spouse name: Not on file   Number of children: Not on file   Years of education: Not on file   Highest education level: Not on file  Occupational History   Not on file  Tobacco Use   Smoking status: Every Day    Packs/day: 0.25    Types: Cigarettes    Last attempt to quit: 11/09/2017    Years since quitting: 5.1   Smokeless tobacco: Never   Tobacco comments:    1 cigarette/day as of 09/25/18  Vaping Use   Vaping Use: Never used  Substance and Sexual Activity   Alcohol use: Yes   Drug use: No   Sexual activity: Not on file  Other Topics Concern   Not on file  Social History Narrative   Not on file   Social Determinants of Health   Financial Resource Strain: Not on file  Food Insecurity: Not on file  Transportation Needs: Not on file  Physical Activity: Not on file  Stress: Not on file  Social Connections: Not on file  Intimate Partner Violence: Not on file    FAMILY HISTORY: Family History  Problem Relation Age of Onset   COPD Mother    Liver cancer Mother     ALLERGIES:  has No Known Allergies.  MEDICATIONS:  . Current Outpatient Medications on File Prior to Visit  Medication Sig Dispense Refill   acyclovir (ZOVIRAX) 400 MG tablet TAKE 1 TABLET BY MOUTH EVERY DAY 90 tablet 3   Albuterol Sulfate (PROAIR RESPICLICK) 932 (90 Base) MCG/ACT AEPB Inhale into the lungs. (Patient not taking: Reported on 09/01/2022)     aspirin EC 81 MG tablet Take 1 tablet (81 mg total) by mouth daily. (Patient not taking: Reported on 11/24/2022) 60 tablet 11   azithromycin (ZITHROMAX) 250 MG tablet 2 tab ('500mg'$ ) for 1 days and then 1 tab ('250mg'$ ) daily for 4 days (Patient not taking: Reported on 11/24/2022) 6 each 0    cholecalciferol (VITAMIN D3) 10 MCG (400 UNIT) TABS tablet Take 1 tablet (400 Units total) by mouth at bedtime. (Patient not taking: Reported on 11/24/2022) 30 tablet 3   ferrous sulfate 325 (65 FE) MG EC tablet Take 1 tablet (325 mg total) by mouth daily with breakfast. 30 tablet 3   FLOVENT DISKUS 250 MCG/ACT AEPB Inhale into the lungs. (Patient not taking: Reported on 09/01/2022)     ipratropium-albuterol (DUONEB) 0.5-2.5 (3) MG/3ML SOLN Inhale into the lungs. (Patient not taking: Reported on 09/01/2022)     lenalidomide (REVLIMID) 10 MG capsule Take 1 capsule (10 mg total) by mouth daily. Take for 21 days on, 7 days off, repeat every 28 days. 21 capsule 0   lidocaine-prilocaine (EMLA) cream Apply 1 application topically as needed. Apply to Port-A-Cath at least 1 hr prior to treatment. (Patient not taking: Reported on 11/24/2022) 30 g 0   Multiple Vitamins-Minerals (MULTIVITAMIN WITH MINERALS) tablet Take 1 tablet by mouth at bedtime.  (Patient not taking: Reported on 11/24/2022)     nicotine (NICODERM CQ - DOSED IN MG/24 HOURS) 14 mg/24hr patch PLACE 1 PATCH (14 MG TOTAL) ONTO THE SKIN DAILY. (Patient not taking: Reported on 11/24/2022) 28 patch 1   ondansetron (ZOFRAN) 4 MG tablet Take 2 tablets (8 mg total) by mouth every 8 (eight) hours as needed for nausea. (Patient not taking: Reported on 09/01/2022) 30 tablet 0   predniSONE (DELTASONE) 20 MG tablet Take 2 tablets (40 mg total) by mouth daily with breakfast. For the next four days (Patient not taking: Reported on 09/01/2022) 8 tablet 0   Vitamin D, Ergocalciferol, (DRISDOL) 1.25 MG (50000 UNIT) CAPS capsule Take 1 capsule (50,000 Units total) by mouth once a week. (Patient not taking: Reported on 11/24/2022) 12 capsule 6   Current Facility-Administered Medications on File Prior to Visit  Medication Dose Route Frequency Provider Last Rate Last Admin   acetaminophen (TYLENOL) 325 MG tablet            famotidine (PEPCID) 20 MG tablet              PHYSICAL EXAMINATION:  ECOG FS:1 - Symptomatic but completely ambulatory Vitals:   12/22/22 1000  BP: 129/86  Pulse: 93  Resp: 18  Temp: 97.9 F (36.6 C)  SpO2: 100%    Wt Readings from Last 3 Encounters:  11/24/22 159 lb 12.8 oz (72.5 kg)  11/10/22 161 lb 8 oz (73.3 kg)  10/27/22 158 lb 0.8 oz (71.7 kg)  NAD GENERAL:alert, in no acute distress and comfortable SKIN: no acute rashes, no significant lesions EYES: conjunctiva are pink and non-injected, sclera anicteric OROPHARYNX: MMM, no exudates, no oropharyngeal erythema or ulceration NECK: supple, no JVD LYMPH:  no palpable lymphadenopathy in the cervical, axillary or inguinal regions LUNGS: clear to auscultation b/l  with normal respiratory effort HEART: regular rate & rhythm ABDOMEN:  normoactive bowel sounds , non tender, not distended. Extremity: no pedal edema PSYCH: alert & oriented x 3 with fluent speech NEURO: no focal motor/sensory deficits   LABORATORY DATA:  I have reviewed the data as listed     Latest Ref Rng & Units 12/22/2022    9:37 AM 12/08/2022    9:11 AM 11/24/2022   10:00 AM  CBC  WBC 4.0 - 10.5 K/uL 6.7  7.0  6.9   Hemoglobin 13.0 - 17.0 g/dL 12.5  12.7  11.9   Hematocrit 39.0 - 52.0 % 36.1  36.4  34.2   Platelets 150 - 400 K/uL 207  222  234    CBC    Component Value Date/Time   WBC 7.0 12/08/2022 0911   WBC 10.1 08/23/2022 0835   RBC 4.05 (L) 12/08/2022 0911   HGB 12.7 (L) 12/08/2022 0911   HGB 8.3 (L) 12/14/2017 1230   HCT 36.4 (L) 12/08/2022 0911   HCT 23.9 (L) 12/14/2017 1230   PLT 222 12/08/2022 0911   PLT 524 (H) 12/14/2017 1230   MCV 89.9 12/08/2022 0911   MCV 88.4 12/14/2017 1230   MCH 31.4 12/08/2022 0911   MCHC 34.9 12/08/2022 0911   RDW 15.9 (H) 12/08/2022 0911   RDW 15.3 (H) 12/14/2017 1230   LYMPHSABS 2.0 12/08/2022 0911   LYMPHSABS 1.4 12/14/2017 1230   MONOABS 1.0 12/08/2022 0911   MONOABS 0.6 12/14/2017 1230   EOSABS 0.4 12/08/2022 0911   EOSABS 0.1  12/14/2017 1230   BASOSABS 0.2 (H) 12/08/2022 0911   BASOSABS 0.1 12/14/2017 1230        Latest Ref Rng & Units 12/22/2022    9:37 AM 12/08/2022    9:11 AM 11/24/2022   10:00 AM  CMP  Glucose 70 - 99 mg/dL 103  90  103   BUN 8 - 23 mg/dL '25  24  26   '$ Creatinine 0.61 - 1.24 mg/dL 1.52  1.46  1.56   Sodium 135 - 145 mmol/L 139  140  141   Potassium 3.5 - 5.1 mmol/L 3.8  4.1  4.3   Chloride 98 - 111 mmol/L 109  107  109   CO2 22 - 32 mmol/L '24  25  25   '$ Calcium 8.9 - 10.3 mg/dL 8.4  9.3  9.1   Total Protein 6.5 - 8.1 g/dL 6.6  7.1  6.5   Total Bilirubin 0.3 - 1.2 mg/dL 0.5  0.5  0.4   Alkaline Phos 38 - 126 U/L 60  57  59   AST 15 - 41 U/L '11  12  15   '$ ALT 0 - 44 U/L '10  11  11       '$ 03/09/20 ([id number]) Bone Marrow Biopsy at [facility]   07/02/2019 Bone Marrow Biopsy ([id number]) at [facility]    08/07/18 BM Bx:        RADIOGRAPHIC STUDIES: I have personally reviewed the radiological images as listed and agreed with the findings in the report. No results found.  ASSESSMENT & PLAN:    67 y.o. 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   Initial BM Bx showed >95% involvement with kappa restricted serum free  light chains. 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 MoL Cy-   08/07/18 BM Bx revealed normocellular bone marrow with trilineage hematopoiesis and 1% plasma cells which indicates the pt is in remission 08/06/18 PET/CT revealed  Expansile lytic lesion involving the left iliac bone, unchanged. Lytic lesion involving the sternum, unchanged. While both are suspicious for myeloma, neither lesion is FDG avid. No hypermetabolic osseous lesions in the  visualized axial and appendicular skeleton. Please note that the bilateral lower extremities were not imaged. No suspicious lymphadenopathy. Spleen is normal in size.    Pt began Ninlaro '3mg'$  maintenance after pt finished 13 cycles of CyBorD and BM bx showed only 1% plasma cells, then resumed CyBorD and held Ninlaro in anticipation of his tandem bone marrow transplants beginning in late December 2019 with Dr. Blinda Leatherwood at [facility]   11/19/18 High dose chemotherapy with Melphalan '140mg'$ /m2 and autologous stem cell rescue  03/20/19 High dose chemotherapy with Melphalan '200mg'$ /m2 and autologous stem cell rescue. Non-infectious diarrhea, uncomplicated course otherwise.  07/02/2019 PET scan with results revealing "1. No FDG avid bone lesions are identified. 2.  Asymmetric uptake within the right, greater than left, palatine tonsils with mild fullness of the right tonsillar soft tissue. ENT consult vs attention on follow up suggested. 3.  Innumerable mixed sclerotic and lytic lesions throughout the axial and appendicular skeleton without associated hypermetabolic activity. 4.  Ancillary CT findings as above."  07/02/2019 bone marrow biopsy ([id number])with results showing: "Trilineage hematopoieses with maturation. No plasma cell myeloma identified. Mild anemia and No Rouleaux or circulating plasma cells identified in the peripheral blood."  2) s/p Prophylactic IM nailing for rt femur  completed Physical [facility].  -patient previously reported improvement and is currently progressively back to full time work.  3) Thrombocytopenia resolved   4) Renal insufficiency --likely related to multiple myeloma.  Remained fairly stable with baseline creatinine of 1.5-2.1.  Creatinine is 1.6 today Plan -maintain follow up with nephrology referral to Dr Marval Regal [facility] for continued treatment -Pt completed 8 weeks of maverick for hep c with Dr Linus Salmons on 07/08/18.  -Kidney numbers  improved. Creatinine in the 1.7-2.2 range   5) h/o tobacco abuse-counseled on smoking cessation -Currently not smoking.   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 Hemoglobin is stable at 11.6  PLAN:  -discussed lab results on 12/22/22 with patient. CBC showed WBC of 6.7 K, hemoglobin of 12.7 K, and platelets of 207 K. CMP stable. -Patient is having some pain along the right side of his neck with stiff muscles likely from a muscle strain.  He will let us know if the pain gets worse or persists since that might be a reason to get additional imaging. -Patient is up-to-date with his vaccinations. -No notable toxicity from his current maintenance carfilzomib plus Revlimid regimen and we shall continue this with the current supportive medications.  Orders updated and signed. -Patient has no lab or clinical evidence of progression of his plasma cell leukemia/multiple myeloma.  FOLLOW UP: Follow-up per integrated scheduling for continued maintenance carfilzomib every 2 weeks with port flush and labs.  The total time spent in the appointment was 30 minutes* .  All of the patient's questions were answered with apparent satisfaction. The patient knows to call the clinic with any problems, questions or concerns.  Sullivan Lone MD [place] AAHIVMS [facility]  [facility] Hematology/Oncology Physician [facility]  .*Total Encounter Time as defined by the Centers for Medicare and Medicaid Services includes, in addition to the face-to-face time of a patient visit (documented in the note above) non-face-to-face time: obtaining and reviewing outside history, ordering and reviewing medications, tests or procedures, care coordination (communications with other health care professionals or caregivers) and documentation in the medical record.  I,Mitra Faeizi,acting as a Education administrator for Sullivan Lone, MD.,have documented all relevant  documentation on the behalf of Sullivan Lone, MD,as directed by  Sullivan Lone, MD while in the presence of Sullivan Lone, MD.  .I have reviewed the above documentation for accuracy and completeness, and I agree with the above. Brunetta Genera MD

## 2022-12-26 ENCOUNTER — Other Ambulatory Visit: Payer: Self-pay

## 2022-12-28 ENCOUNTER — Encounter: Payer: Self-pay | Admitting: Hematology

## 2023-01-02 ENCOUNTER — Telehealth: Payer: Self-pay | Admitting: Hematology

## 2023-01-02 NOTE — Telephone Encounter (Signed)
Per 1/19 IB, pt has been called and wants to keep appt on 2/9 as next infusion

## 2023-01-03 ENCOUNTER — Encounter: Payer: Self-pay | Admitting: Hematology

## 2023-01-03 ENCOUNTER — Other Ambulatory Visit (HOSPITAL_COMMUNITY): Payer: Self-pay

## 2023-01-03 ENCOUNTER — Telehealth: Payer: Self-pay | Admitting: Pharmacy Technician

## 2023-01-03 NOTE — Telephone Encounter (Signed)
[name] Patient Advocate Encounter   Began application for assistance for Revlimid through [employer].   Application will be submitted upon completion of necessary supporting documentation.   [employer] Access Support phone Fernandez [phone].   I will continue to check the status until final determination.   Andres Fernandez, Andres Fernandez Andres Fernandez: [phone]  Fax: [phone]

## 2023-01-04 NOTE — Telephone Encounter (Signed)
Oral Oncology Patient Advocate Encounter  Reached out and spoke with patient regarding PAP paperwork, explained that I would send it to their preferred email via DocuSign.   Confirmed email address: [email or web address]    Patient expressed understanding and consent.  Will follow up once paperwork has been signed and returned.   Lady Deutscher, CPhT-Adv Oncology Pharmacy Patient [place] Direct Number: [phone]  Fax: [phone]

## 2023-01-05 ENCOUNTER — Other Ambulatory Visit: Payer: Medicare Other

## 2023-01-05 ENCOUNTER — Ambulatory Visit: Payer: Medicare Other

## 2023-01-05 NOTE — Telephone Encounter (Signed)
Oral Oncology Patient Advocate Encounter   Submitted application for assistance for Revlimid to [employer] Access Support.   Application submitted via e-fax to [phone]   [employer] Access Support  phone number [phone].   I will continue to check the status until final determination.   Lady Deutscher, CPhT-Adv Oncology Pharmacy Patient [place] Direct Number: [phone]  Fax: [phone]

## 2023-01-11 ENCOUNTER — Other Ambulatory Visit (HOSPITAL_COMMUNITY): Payer: Self-pay

## 2023-01-11 ENCOUNTER — Telehealth: Payer: Self-pay | Admitting: Pharmacy Technician

## 2023-01-11 NOTE — Telephone Encounter (Signed)
Oral Oncology Patient Advocate Encounter   Was successful in securing patient a $7,500 grant from Patient [place] (PAF) to provide copayment coverage for Lenalidomide.  This will keep the out of pocket expense at $0.     I have spoken with the patient.    The billing information is as follows and has been shared with [facility].   RxBin: [id number] PCN:  [id number] Member ID: [id number] Group ID: [id number] Dates of Eligibility: 01/11/23 through 01/11/24  Andres Fernandez, CPhT-Adv Oncology Pharmacy Patient [place] Direct Number: [phone]  Fax: [phone]

## 2023-01-11 NOTE — Telephone Encounter (Signed)
Oral Oncology Patient Advocate Encounter   Was successful in securing patient a $ 5,000 grant from [employer] [place] (LLS) to provide copayment coverage for his Lenalidomide.  This will keep the out of pocket expense at $0.     I have spoken with the patient.  The billing information is as follows and has been shared with [facility].   Member ID: [id number] Group ID: [id number] RxBin: [id number] Dates of Eligibility: 01/11/23 through 01/11/24  Fund:  [place], [place] Patient [place] Direct Number: [phone]  Fax: [phone]

## 2023-01-17 NOTE — Telephone Encounter (Signed)
Oral Oncology Patient Advocate Encounter  Application placed on hold, patient was able to receive additional funding from outside sources to cover the cost of his medication.  I will re-open this case at a later time if needed.  Lady Deutscher, CPhT-Adv Oncology Pharmacy Patient [place] Direct Number: [phone]  Fax: [phone]

## 2023-01-19 ENCOUNTER — Inpatient Hospital Stay: Payer: Medicare Other | Attending: Hematology

## 2023-01-19 ENCOUNTER — Inpatient Hospital Stay: Payer: Medicare Other

## 2023-01-19 VITALS — BP 128/83 | HR 75 | Temp 97.8°F | Resp 18 | Wt 161.0 lb

## 2023-01-19 DIAGNOSIS — Z5111 Encounter for antineoplastic chemotherapy: Secondary | ICD-10-CM | POA: Diagnosis present

## 2023-01-19 DIAGNOSIS — Z7189 Other specified counseling: Secondary | ICD-10-CM

## 2023-01-19 DIAGNOSIS — C9002 Multiple myeloma in relapse: Secondary | ICD-10-CM | POA: Insufficient documentation

## 2023-01-19 DIAGNOSIS — C9010 Plasma cell leukemia not having achieved remission: Secondary | ICD-10-CM

## 2023-01-19 DIAGNOSIS — Z95828 Presence of other vascular implants and grafts: Secondary | ICD-10-CM

## 2023-01-19 DIAGNOSIS — C9001 Multiple myeloma in remission: Secondary | ICD-10-CM | POA: Insufficient documentation

## 2023-01-19 DIAGNOSIS — C9011 Plasma cell leukemia in remission: Secondary | ICD-10-CM | POA: Diagnosis not present

## 2023-01-19 DIAGNOSIS — M84551A Pathological fracture in neoplastic disease, right femur, initial encounter for fracture: Secondary | ICD-10-CM

## 2023-01-19 LAB — CMP (CANCER CENTER ONLY)
ALT: 10 U/L (ref 0–44)
AST: 13 U/L — ABNORMAL LOW (ref 15–41)
Albumin: 3.9 g/dL (ref 3.5–5.0)
Alkaline Phosphatase: 55 U/L (ref 38–126)
Anion gap: 7 (ref 5–15)
BUN: 21 mg/dL (ref 8–23)
CO2: 24 mmol/L (ref 22–32)
Calcium: 9.1 mg/dL (ref 8.9–10.3)
Chloride: 110 mmol/L (ref 98–111)
Creatinine: 1.49 mg/dL — ABNORMAL HIGH (ref 0.61–1.24)
GFR, Estimated: 52 mL/min — ABNORMAL LOW (ref >60)
Glucose, Bld: 112 mg/dL — ABNORMAL HIGH (ref 70–99)
Potassium: 4.3 mmol/L (ref 3.5–5.1)
Sodium: 141 mmol/L (ref 135–145)
Total Bilirubin: 0.5 mg/dL (ref 0.3–1.2)
Total Protein: 6.9 g/dL (ref 6.5–8.1)

## 2023-01-19 LAB — CBC WITH DIFFERENTIAL (CANCER CENTER ONLY)
Abs Immature Granulocytes: 0.01 10*3/uL (ref 0.00–0.07)
Basophils Absolute: 0.1 10*3/uL (ref 0.0–0.1)
Basophils Relative: 1 %
Eosinophils Absolute: 0.3 10*3/uL (ref 0.0–0.5)
Eosinophils Relative: 4 %
HCT: 37.4 % — ABNORMAL LOW (ref 39.0–52.0)
Hemoglobin: 12.8 g/dL — ABNORMAL LOW (ref 13.0–17.0)
Immature Granulocytes: 0 %
Lymphocytes Relative: 28 %
Lymphs Abs: 2 10*3/uL (ref 0.7–4.0)
MCH: 31.8 pg (ref 26.0–34.0)
MCHC: 34.2 g/dL (ref 30.0–36.0)
MCV: 93 fL (ref 80.0–100.0)
Monocytes Absolute: 1.3 10*3/uL — ABNORMAL HIGH (ref 0.1–1.0)
Monocytes Relative: 18 %
Neutro Abs: 3.5 10*3/uL (ref 1.7–7.7)
Neutrophils Relative %: 49 %
Platelet Count: 205 10*3/uL (ref 150–400)
RBC: 4.02 MIL/uL — ABNORMAL LOW (ref 4.22–5.81)
RDW: 16.6 % — ABNORMAL HIGH (ref 11.5–15.5)
WBC Count: 7.1 10*3/uL (ref 4.0–10.5)
nRBC: 0 % (ref 0.0–0.2)

## 2023-01-19 MED ORDER — SODIUM CHLORIDE 0.9 % IV SOLN
12.0000 mg | Freq: Once | INTRAVENOUS | Status: AC
  Administered 2023-01-19: 12 mg via INTRAVENOUS
  Filled 2023-01-19: qty 1.2

## 2023-01-19 MED ORDER — HEPARIN SOD (PORK) LOCK FLUSH 100 UNIT/ML IV SOLN
500.0000 [IU] | Freq: Once | INTRAVENOUS | Status: AC | PRN
  Administered 2023-01-19: 500 [IU]

## 2023-01-19 MED ORDER — SODIUM CHLORIDE 0.9% FLUSH
10.0000 mL | Freq: Once | INTRAVENOUS | Status: AC
  Administered 2023-01-19: 10 mL

## 2023-01-19 MED ORDER — SODIUM CHLORIDE 0.9 % IV SOLN: Freq: Once | INTRAVENOUS | Status: AC

## 2023-01-19 MED ORDER — FAMOTIDINE IN NACL 20-0.9 MG/50ML-% IV SOLN
20.0000 mg | Freq: Once | INTRAVENOUS | Status: AC
  Administered 2023-01-19: 20 mg via INTRAVENOUS
  Filled 2023-01-19: qty 50

## 2023-01-19 MED ORDER — ACETAMINOPHEN 325 MG PO TABS
650.0000 mg | ORAL_TABLET | Freq: Once | ORAL | Status: AC
  Administered 2023-01-19: 650 mg via ORAL
  Filled 2023-01-19: qty 2

## 2023-01-19 MED ORDER — HEPARIN SOD (PORK) LOCK FLUSH 100 UNIT/ML IV SOLN: 500.0000 [IU] | Freq: Once | INTRAVENOUS | Status: DC

## 2023-01-19 MED ORDER — DEXTROSE 5 % IV SOLN
56.0000 mg/m2 | Freq: Once | INTRAVENOUS | Status: AC
  Administered 2023-01-19: 100 mg via INTRAVENOUS
  Filled 2023-01-19: qty 30

## 2023-01-19 MED ORDER — SODIUM CHLORIDE 0.9% FLUSH
10.0000 mL | INTRAVENOUS | Status: DC | PRN
  Administered 2023-01-19: 10 mL

## 2023-01-19 NOTE — Patient Instructions (Signed)
[facility]  Discharge Instructions: Thank you for choosing [facility] to provide your oncology and hematology care.   If you have a lab appointment with the [facility], please go directly to the [facility] and check in at the registration area.   Wear comfortable clothing and clothing appropriate for easy access to any Portacath or PICC line.   We strive to give you quality time with your provider. You may need to reschedule your appointment if you arrive late (15 or more minutes).  Arriving late affects you and other patients whose appointments are after yours.  Also, if you miss three or more appointments without notifying the office, you may be dismissed from the clinic at the provider's discretion.      For prescription refill requests, have your pharmacy contact our office and allow 72 hours for refills to be completed.    Today you received the following chemotherapy and/or immunotherapy agents [place]      To help prevent nausea and vomiting after your treatment, we encourage you to take your nausea medication as directed.  BELOW ARE SYMPTOMS THAT SHOULD BE REPORTED IMMEDIATELY: *FEVER GREATER THAN 100.4 F (38 C) OR HIGHER *CHILLS OR SWEATING *NAUSEA AND VOMITING THAT IS NOT CONTROLLED WITH YOUR NAUSEA MEDICATION *UNUSUAL SHORTNESS OF BREATH *UNUSUAL BRUISING OR BLEEDING *URINARY PROBLEMS (pain or burning when urinating, or frequent urination) *BOWEL PROBLEMS (unusual diarrhea, constipation, pain near the anus) TENDERNESS IN MOUTH AND THROAT WITH OR WITHOUT PRESENCE OF ULCERS (sore throat, sores in mouth, or a toothache) UNUSUAL RASH, SWELLING OR PAIN  UNUSUAL VAGINAL DISCHARGE OR ITCHING   Items with * indicate a potential emergency and should be followed up as soon as possible or go to the Emergency Department if any problems should occur.  Please show the CHEMOTHERAPY ALERT CARD or IMMUNOTHERAPY ALERT CARD at  check-in to the Emergency Department and triage nurse.  Should you have questions after your visit or need to cancel or reschedule your appointment, please contact [facility]  Dept: [phone]  and follow the prompts.  Office hours are 8:00 a.m. to 4:30 p.m. Monday - Friday. Please note that voicemails left after 4:00 p.m. may not be returned until the following business day.  We are closed weekends and major holidays. You have access to a nurse at all times for urgent questions. Please call the main number to the clinic Dept: [phone] and follow the prompts.   For any non-urgent questions, you may also contact your provider using MyChart. We now offer e-Visits for anyone 89 and older to request care online for non-urgent symptoms. For details visit [email or web address].   Also download the MyChart app! Go to the app store, search "MyChart", open the app, select [facility], and log in with your MyChart username and password.

## 2023-01-22 LAB — KAPPA/LAMBDA LIGHT CHAINS
Kappa free light chain: 31.1 mg/L — ABNORMAL HIGH (ref 3.3–19.4)
Kappa, lambda light chain ratio: 1.52 (ref 0.26–1.65)
Lambda free light chains: 20.4 mg/L (ref 5.7–26.3)

## 2023-02-02 ENCOUNTER — Other Ambulatory Visit: Payer: Self-pay

## 2023-02-02 ENCOUNTER — Inpatient Hospital Stay: Payer: Medicare Other

## 2023-02-02 ENCOUNTER — Inpatient Hospital Stay: Payer: Medicare Other | Admitting: Hematology

## 2023-02-02 VITALS — BP 137/84 | HR 87 | Resp 18

## 2023-02-02 VITALS — BP 148/89 | HR 80 | Temp 97.7°F | Resp 20 | Wt 162.9 lb

## 2023-02-02 DIAGNOSIS — C9000 Multiple myeloma not having achieved remission: Secondary | ICD-10-CM

## 2023-02-02 DIAGNOSIS — Z95828 Presence of other vascular implants and grafts: Secondary | ICD-10-CM

## 2023-02-02 DIAGNOSIS — C9010 Plasma cell leukemia not having achieved remission: Secondary | ICD-10-CM

## 2023-02-02 DIAGNOSIS — Z5111 Encounter for antineoplastic chemotherapy: Secondary | ICD-10-CM | POA: Diagnosis not present

## 2023-02-02 DIAGNOSIS — Z7189 Other specified counseling: Secondary | ICD-10-CM

## 2023-02-02 DIAGNOSIS — M84551A Pathological fracture in neoplastic disease, right femur, initial encounter for fracture: Secondary | ICD-10-CM

## 2023-02-02 LAB — CBC WITH DIFFERENTIAL (CANCER CENTER ONLY)
Abs Immature Granulocytes: 0.02 10*3/uL (ref 0.00–0.07)
Basophils Absolute: 0.1 10*3/uL (ref 0.0–0.1)
Basophils Relative: 2 %
Eosinophils Absolute: 0.3 10*3/uL (ref 0.0–0.5)
Eosinophils Relative: 4 %
HCT: 35.8 % — ABNORMAL LOW (ref 39.0–52.0)
Hemoglobin: 12.8 g/dL — ABNORMAL LOW (ref 13.0–17.0)
Immature Granulocytes: 0 %
Lymphocytes Relative: 26 %
Lymphs Abs: 1.8 10*3/uL (ref 0.7–4.0)
MCH: 33 pg (ref 26.0–34.0)
MCHC: 35.8 g/dL (ref 30.0–36.0)
MCV: 92.3 fL (ref 80.0–100.0)
Monocytes Absolute: 1.1 10*3/uL — ABNORMAL HIGH (ref 0.1–1.0)
Monocytes Relative: 16 %
Neutro Abs: 3.8 10*3/uL (ref 1.7–7.7)
Neutrophils Relative %: 52 %
Platelet Count: 240 10*3/uL (ref 150–400)
RBC: 3.88 MIL/uL — ABNORMAL LOW (ref 4.22–5.81)
RDW: 16.9 % — ABNORMAL HIGH (ref 11.5–15.5)
WBC Count: 7.1 10*3/uL (ref 4.0–10.5)
nRBC: 0 % (ref 0.0–0.2)

## 2023-02-02 LAB — CMP (CANCER CENTER ONLY)
ALT: 11 U/L (ref 0–44)
AST: 11 U/L — ABNORMAL LOW (ref 15–41)
Albumin: 4.2 g/dL (ref 3.5–5.0)
Alkaline Phosphatase: 53 U/L (ref 38–126)
Anion gap: 7 (ref 5–15)
BUN: 23 mg/dL (ref 8–23)
CO2: 25 mmol/L (ref 22–32)
Calcium: 8.6 mg/dL — ABNORMAL LOW (ref 8.9–10.3)
Chloride: 108 mmol/L (ref 98–111)
Creatinine: 1.44 mg/dL — ABNORMAL HIGH (ref 0.61–1.24)
GFR, Estimated: 54 mL/min — ABNORMAL LOW (ref >60)
Glucose, Bld: 88 mg/dL (ref 70–99)
Potassium: 4.3 mmol/L (ref 3.5–5.1)
Sodium: 140 mmol/L (ref 135–145)
Total Bilirubin: 0.5 mg/dL (ref 0.3–1.2)
Total Protein: 6.8 g/dL (ref 6.5–8.1)

## 2023-02-02 MED ORDER — ACYCLOVIR 400 MG PO TABS: 400.0000 mg | ORAL_TABLET | Freq: Every day | ORAL | 3 refills | Status: DC

## 2023-02-02 MED ORDER — DEXTROSE 5 % IV SOLN
56.0000 mg/m2 | Freq: Once | INTRAVENOUS | Status: AC
  Administered 2023-02-02: 100 mg via INTRAVENOUS
  Filled 2023-02-02: qty 30

## 2023-02-02 MED ORDER — SODIUM CHLORIDE 0.9 % IV SOLN: Freq: Once | INTRAVENOUS | Status: AC

## 2023-02-02 MED ORDER — FAMOTIDINE IN NACL 20-0.9 MG/50ML-% IV SOLN
20.0000 mg | Freq: Once | INTRAVENOUS | Status: AC
  Administered 2023-02-02: 20 mg via INTRAVENOUS
  Filled 2023-02-02: qty 50

## 2023-02-02 MED ORDER — SODIUM CHLORIDE 0.9 % IV SOLN
12.0000 mg | Freq: Once | INTRAVENOUS | Status: AC
  Administered 2023-02-02: 12 mg via INTRAVENOUS
  Filled 2023-02-02: qty 1.2

## 2023-02-02 MED ORDER — HEPARIN SOD (PORK) LOCK FLUSH 100 UNIT/ML IV SOLN
500.0000 [IU] | Freq: Once | INTRAVENOUS | Status: AC | PRN
  Administered 2023-02-02: 500 [IU]

## 2023-02-02 MED ORDER — SODIUM CHLORIDE 0.9% FLUSH
10.0000 mL | Freq: Once | INTRAVENOUS | Status: AC
  Administered 2023-02-02: 10 mL

## 2023-02-02 MED ORDER — SODIUM CHLORIDE 0.9% FLUSH
10.0000 mL | INTRAVENOUS | Status: DC | PRN
  Administered 2023-02-02: 10 mL

## 2023-02-02 MED ORDER — SODIUM CHLORIDE 0.9 % IV SOLN
60.0000 mg | Freq: Once | INTRAVENOUS | Status: AC
  Administered 2023-02-02: 60 mg via INTRAVENOUS
  Filled 2023-02-02: qty 20

## 2023-02-02 MED ORDER — ACETAMINOPHEN 325 MG PO TABS
650.0000 mg | ORAL_TABLET | Freq: Once | ORAL | Status: AC
  Administered 2023-02-02: 650 mg via ORAL
  Filled 2023-02-02: qty 2

## 2023-02-02 MED ORDER — SODIUM CHLORIDE 0.9 % IV SOLN
60.0000 mg | Freq: Once | INTRAVENOUS | Status: DC
  Filled 2023-02-02: qty 10

## 2023-02-02 NOTE — Progress Notes (Signed)
Per Irene Limbo, MD okay to give Aredia with Calcium 8.6

## 2023-02-02 NOTE — Progress Notes (Signed)
HEMATOLOGY/ONCOLOGY CLINIC NOTE  Date of Service: 02/02/23   Chief Complaint: Follow-up for continued evaluation and management of multiple myeloma/plasma cell leukemia  HISTORY OF PRESENTING ILLNESS:  Please see previous note for details of initial presentation.  INTERVAL HISTORY:   Andres Fernandez is here for continued evaluation and management of his multiple myeloma and continued carfilzomib and Revlimid maintenance.    Patient was last seen by me on 12/22/22 and complained of sharp pain in his neck and soreness in his arms with lifting. Otherwise, he was doing well overall.  Today, he reports doing well overall and recently retired from work last month. He does complain of occasional pain in his left neck approximately every two weeks. The cramping sensation lasts about 30 seconds at a time and is not present in any other muscle.    He denies any infection issues, leg swelling, abdominal pain, bone pain, fever, chills, night sweats, unexpected weight loss, or issues with his current medication. He is UTD with his vaccinations, including RSV, influenza, and COVID-19 booster.   REVIEW OF SYSTEMS:    10 Point review of Systems was done is negative except as noted above.   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   Tobacco abuse     SURGICAL HISTORY: Past Surgical History:  Procedure Laterality Date   APPENDECTOMY     FEMUR IM NAIL Right 11/20/2017   Procedure: RIGHT FEMORAL INTRAMEDULLARY (IM) NAIL;  Surgeon: Nicholes Stairs, MD;  Location: [facility];  Service: Orthopedics;  Laterality: Right;   IR IMAGING GUIDED PORT INSERTION  08/19/2021    SOCIAL HISTORY: Social History   Socioeconomic History   Marital status: Single    Spouse name: Not on file   Number of children: Not on file   Years of education: Not on file   Highest  education level: Not on file  Occupational History   Not on file  Tobacco Use   Smoking status: Every Day    Packs/day: 0.25    Types: Cigarettes    Last attempt to quit: 11/09/2017    Years since quitting: 5.2   Smokeless tobacco: Never   Tobacco comments:    1 cigarette/day as of 09/25/18  Vaping Use   Vaping Use: Never used  Substance and Sexual Activity   Alcohol use: Yes   Drug use: No   Sexual activity: Not on file  Other Topics Concern   Not on file  Social History Narrative   Not on file   Social Determinants of Health   Financial Resource Strain: Not on file  Food Insecurity: Not on file  Transportation Needs: Not on file  Physical Activity: Not on file  Stress: Not on file  Social Connections: Not on file  Intimate Partner Violence: Not on file    FAMILY HISTORY: Family History  Problem Relation Age of Onset   COPD Mother    Liver cancer Mother     ALLERGIES:  has No Known Allergies.  MEDICATIONS:  . Current Outpatient Medications on File Prior to Visit  Medication Sig Dispense Refill   acyclovir (ZOVIRAX) 400 MG tablet TAKE 1 TABLET BY MOUTH EVERY DAY 90 tablet 3   Albuterol Sulfate (PROAIR RESPICLICK) 123XX123 (90 Base) MCG/ACT AEPB Inhale into the lungs. (Patient not taking: Reported on 09/01/2022)     aspirin EC  81 MG tablet Take 1 tablet (81 mg total) by mouth daily. (Patient not taking: Reported on 11/24/2022) 60 tablet 11   azithromycin (ZITHROMAX) 250 MG tablet 2 tab ('500mg'$ ) for 1 days and then 1 tab ('250mg'$ ) daily for 4 days (Patient not taking: Reported on 11/24/2022) 6 each 0   cholecalciferol (VITAMIN D3) 10 MCG (400 UNIT) TABS tablet Take 1 tablet (400 Units total) by mouth at bedtime. (Patient not taking: Reported on 11/24/2022) 30 tablet 3   ferrous sulfate 325 (65 FE) MG EC tablet Take 1 tablet (325 mg total) by mouth daily with breakfast. 30 tablet 3   FLOVENT DISKUS 250 MCG/ACT AEPB Inhale into the lungs. (Patient not taking: Reported on  09/01/2022)     ipratropium-albuterol (DUONEB) 0.5-2.5 (3) MG/3ML SOLN Inhale into the lungs. (Patient not taking: Reported on 09/01/2022)     lenalidomide (REVLIMID) 10 MG capsule Take 1 capsule (10 mg total) by mouth daily. Take for 21 days on, 7 days off, repeat every 28 days. 21 capsule 0   lidocaine-prilocaine (EMLA) cream Apply 1 application topically as needed. Apply to Port-A-Cath at least 1 hr prior to treatment. (Patient not taking: Reported on 11/24/2022) 30 g 0   Multiple Vitamins-Minerals (MULTIVITAMIN WITH MINERALS) tablet Take 1 tablet by mouth at bedtime.  (Patient not taking: Reported on 11/24/2022)     nicotine (NICODERM CQ - DOSED IN MG/24 HOURS) 14 mg/24hr patch PLACE 1 PATCH (14 MG TOTAL) ONTO THE SKIN DAILY. (Patient not taking: Reported on 11/24/2022) 28 patch 1   ondansetron (ZOFRAN) 4 MG tablet Take 2 tablets (8 mg total) by mouth every 8 (eight) hours as needed for nausea. (Patient not taking: Reported on 09/01/2022) 30 tablet 0   predniSONE (DELTASONE) 20 MG tablet Take 2 tablets (40 mg total) by mouth daily with breakfast. For the next four days (Patient not taking: Reported on 09/01/2022) 8 tablet 0   Vitamin D, Ergocalciferol, (DRISDOL) 1.25 MG (50000 UNIT) CAPS capsule Take 1 capsule (50,000 Units total) by mouth once a week. (Patient not taking: Reported on 11/24/2022) 12 capsule 6   Current Facility-Administered Medications on File Prior to Visit  Medication Dose Route Frequency Provider Last Rate Last Admin   acetaminophen (TYLENOL) 325 MG tablet            famotidine (PEPCID) 20 MG tablet             PHYSICAL EXAMINATION:  ECOG FS:1 - Symptomatic but completely ambulatory Vitals:   02/02/23 1100  BP: (!) 148/89  Pulse: 80  Resp: 20  Temp: 97.7 F (36.5 C)  SpO2: 100%   Wt Readings from Last 3 Encounters:  02/02/23 162 lb 14.4 oz (73.9 kg)  01/19/23 161 lb (73 kg)  12/22/22 161 lb 11.2 oz (73.3 kg)   GENERAL:alert, in no acute distress and  comfortable SKIN: no acute rashes, no significant lesions EYES: conjunctiva are pink and non-injected, sclera anicteric OROPHARYNX: MMM, no exudates, no oropharyngeal erythema or ulceration NECK: supple, no JVD LYMPH:  no palpable lymphadenopathy in the cervical, axillary or inguinal regions LUNGS: clear to auscultation b/l with normal respiratory effort HEART: regular rate & rhythm ABDOMEN:  normoactive bowel sounds , non tender, not distended. Extremity: no pedal edema PSYCH: alert & oriented x 3 with fluent speech NEURO: no focal motor/sensory deficits   LABORATORY DATA:  I have reviewed the data as listed     Latest Ref Rng & Units 02/02/2023   10:22 AM 01/19/2023  8:23 AM 12/22/2022    9:37 AM  CBC  WBC 4.0 - 10.5 K/uL 7.1  7.1  6.7   Hemoglobin 13.0 - 17.0 g/dL 12.8  12.8  12.5   Hematocrit 39.0 - 52.0 % 35.8  37.4  36.1   Platelets 150 - 400 K/uL 240  205  207    CBC    Component Value Date/Time   WBC 7.1 01/19/2023 0823   WBC 10.1 08/23/2022 0835   RBC 4.02 (L) 01/19/2023 0823   HGB 12.8 (L) 01/19/2023 0823   HGB 8.3 (L) 12/14/2017 1230   HCT 37.4 (L) 01/19/2023 0823   HCT 23.9 (L) 12/14/2017 1230   PLT 205 01/19/2023 0823   PLT 524 (H) 12/14/2017 1230   MCV 93.0 01/19/2023 0823   MCV 88.4 12/14/2017 1230   MCH 31.8 01/19/2023 0823   MCHC 34.2 01/19/2023 0823   RDW 16.6 (H) 01/19/2023 0823   RDW 15.3 (H) 12/14/2017 1230   LYMPHSABS 2.0 01/19/2023 0823   LYMPHSABS 1.4 12/14/2017 1230   MONOABS 1.3 (H) 01/19/2023 0823   MONOABS 0.6 12/14/2017 1230   EOSABS 0.3 01/19/2023 0823   EOSABS 0.1 12/14/2017 1230   BASOSABS 0.1 01/19/2023 0823   BASOSABS 0.1 12/14/2017 1230        Latest Ref Rng & Units 02/02/2023   10:22 AM 01/19/2023    8:23 AM 12/22/2022    9:37 AM  CMP  Glucose 70 - 99 mg/dL 88  112  103   BUN 8 - 23 mg/dL '23  21  25   '$ Creatinine 0.61 - 1.24 mg/dL 1.44  1.49  1.52   Sodium 135 - 145 mmol/L 140  141  139   Potassium 3.5 - 5.1 mmol/L 4.3   4.3  3.8   Chloride 98 - 111 mmol/L 108  110  109   CO2 22 - 32 mmol/L '25  24  24   '$ Calcium 8.9 - 10.3 mg/dL 8.6  9.1  8.4   Total Protein 6.5 - 8.1 g/dL 6.8  6.9  6.6   Total Bilirubin 0.3 - 1.2 mg/dL 0.5  0.5  0.5   Alkaline Phos 38 - 126 U/L 53  55  60   AST 15 - 41 U/L '11  13  11   '$ ALT 0 - 44 U/L '11  10  10      '$ 03/09/20 ([id number]) [facility] Marrow Biopsy at [facility]   07/02/2019 Bone Marrow [facility] ([id number]) [facility]    08/07/18 BM Bx:        RADIOGRAPHIC STUDIES: I have personally reviewed the radiological images as listed and agreed with the findings in the report. No results found.  ASSESSMENT & PLAN:    67 y.o. 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   Initial BM Bx showed >95% involvement with kappa restricted serum free light chains. 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 MoL Cy-   08/07/18 BM Bx revealed normocellular bone marrow with trilineage hematopoiesis and 1% plasma cells which indicates the pt is in remission 08/06/18 PET/CT revealed  Expansile lytic lesion involving the left iliac bone, unchanged. Lytic lesion involving the sternum, unchanged. While both are suspicious for myeloma, neither lesion is  FDG avid. No hypermetabolic osseous lesions in the visualized axial and appendicular skeleton. Please note that the bilateral lower extremities were not imaged. No suspicious lymphadenopathy. Spleen is normal in size.    Pt began Ninlaro '3mg'$  maintenance after pt finished 13 cycles of CyBorD and BM bx showed only 1% plasma cells, then resumed CyBorD and held Ninlaro in anticipation of his tandem bone marrow transplants beginning in late  December 2019 with Dr. Blinda Leatherwood at [facility]   11/19/18 High dose chemotherapy with Melphalan '140mg'$ /m2 and autologous stem cell rescue  03/20/19 High dose chemotherapy with Melphalan '200mg'$ /m2 and autologous stem cell rescue. Non-infectious diarrhea, uncomplicated course otherwise.  07/02/2019 PET scan with results revealing "1. No FDG avid bone lesions are identified. 2.  Asymmetric uptake within the right, greater than left, palatine tonsils with mild fullness of the right tonsillar soft tissue. ENT consult vs attention on follow up suggested. 3.  Innumerable mixed sclerotic and lytic lesions throughout the axial and appendicular skeleton without associated hypermetabolic activity. 4.  Ancillary CT findings as above."  07/02/2019 bone marrow biopsy ([id number])with results showing: "Trilineage hematopoieses with maturation. No plasma cell myeloma identified. Mild anemia and No Rouleaux or circulating plasma cells identified in the peripheral blood."  2) s/p Prophylactic IM nailing for rt femur  completed Physical Therapy at the [facility].  -patient previously reported improvement and is currently progressively back to full time work.  3) Thrombocytopenia resolved   4) Renal insufficiency --likely related to multiple myeloma.  Remained fairly stable with baseline creatinine of 1.5-2.1.  Creatinine is 1.44 today Plan -maintain follow up with nephrology referral to Dr Marval Regal [facility] for continued treatment -Pt completed 8 weeks of maverick for hep c with Dr Linus Salmons on 07/08/18.  -Kidney numbers improved. Creatinine in the 1.7-2.2 range   5) h/o tobacco abuse-counseled on smoking cessation -Currently not smoking.   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 Hemoglobin is stable at 12.8  PLAN:  -Discussed lab results on 02/02/23  with patient. CBC showed WBC of 7.1K, hemoglobin of 12.8, and  platelets of 240K. -cmp stable with CKD -Discussed that muscle cramping in left neck is likely due to Revlimid.  Recommended staying well-hydrated with adequate electrolyte intake compresses.  Avoiding significant cold exposure. -Patient is up-to-date with his vaccinations. -Patient has no lab or clinical evidence of progression of his plasma cell leukemia/multiple myeloma. -He will continue his current carfilzomib and Revlimid maintenance. -We shall continue to wean down his dexamethasone.  Have cut it down from 12 mg to 10 mg with each treatment and will continue to reduce this. -Continue Aredia every 8 weeks for now .  Will discuss switching this to every 64-monthmaintenance if he continues to remain stable.  FOLLOW-UP: Per integrated scheduling  The total time spent in the appointment was 30 minutes* .  All of the patient's questions were answered with apparent satisfaction. The patient knows to call the clinic with any problems, questions or concerns.   GSullivan LoneMD MS AAHIVMS SCarolina [facility] UniversityCCasey County HospitalHematology/Oncology Physician [facility] .*Total Encounter Time as defined by the Centers for Medicare and Medicaid Services includes, in addition to the face-to-face time of a patient visit (documented in the note above) non-face-to-face time: obtaining and reviewing outside history, ordering and reviewing medications, tests or procedures, care coordination (communications with other health care professionals or caregivers) and documentation in the  medical record.   I,Mitra Faeizi,acting as a Education administrator for Sullivan Lone, MD.,have documented all relevant documentation on the behalf of Sullivan Lone, MD,as directed by  Sullivan Lone, MD while in the presence of Sullivan Lone, MD.  .I have reviewed the above documentation for accuracy and completeness, and I agree with the above. Brunetta Genera MD

## 2023-02-02 NOTE — Progress Notes (Signed)
Patient seen by MD today  Vitals are within treatment parameters.  Labs reviewed: and are not all within treatment parameters. Dr Irene Limbo aware CR: 1.44  Per physician team, patient is ready for treatment and there are NO modifications to the treatment plan.

## 2023-02-02 NOTE — Patient Instructions (Signed)
[facility]  Discharge Instructions: Thank you for choosing [facility] to provide your oncology and hematology care.   If you have a lab appointment with the [facility], please go directly to the [facility] and check in at the registration area.   Wear comfortable clothing and clothing appropriate for easy access to any Portacath or PICC line.   We strive to give you quality time with your provider. You may need to reschedule your appointment if you arrive late (15 or more minutes).  Arriving late affects you and other patients whose appointments are after yours.  Also, if you miss three or more appointments without notifying the office, you may be dismissed from the clinic at the provider's discretion.      For prescription refill requests, have your pharmacy contact our office and allow 72 hours for refills to be completed.    Today you received the following chemotherapy and/or immunotherapy agents Krypolis  & Aredia   To help prevent nausea and vomiting after your treatment, we encourage you to take your nausea medication as directed.  BELOW ARE SYMPTOMS THAT SHOULD BE REPORTED IMMEDIATELY: *FEVER GREATER THAN 100.4 F (38 C) OR HIGHER *CHILLS OR SWEATING *NAUSEA AND VOMITING THAT IS NOT CONTROLLED WITH YOUR NAUSEA MEDICATION *UNUSUAL SHORTNESS OF BREATH *UNUSUAL BRUISING OR BLEEDING *URINARY PROBLEMS (pain or burning when urinating, or frequent urination) *BOWEL PROBLEMS (unusual diarrhea, constipation, pain near the anus) TENDERNESS IN MOUTH AND THROAT WITH OR WITHOUT PRESENCE OF ULCERS (sore throat, sores in mouth, or a toothache) UNUSUAL RASH, SWELLING OR PAIN  UNUSUAL VAGINAL DISCHARGE OR ITCHING   Items with * indicate a potential emergency and should be followed up as soon as possible or go to the Emergency Department if any problems should occur.  Please show the CHEMOTHERAPY ALERT CARD or IMMUNOTHERAPY ALERT CARD at  check-in to the Emergency Department and triage nurse.  Should you have questions after your visit or need to cancel or reschedule your appointment, please contact [facility]  Dept: [phone]  and follow the prompts.  Office hours are 8:00 a.m. to 4:30 p.m. Monday - Friday. Please note that voicemails left after 4:00 p.m. may not be returned until the following business day.  We are closed weekends and major holidays. You have access to a nurse at all times for urgent questions. Please call the main number to the clinic Dept: [phone] and follow the prompts.   For any non-urgent questions, you may also contact your provider using MyChart. We now offer e-Visits for anyone 7 and older to request care online for non-urgent symptoms. For details visit [email or web address].   Also download the MyChart app! Go to the app store, search "MyChart", open the app, select [facility], and log in with your MyChart username and password.

## 2023-02-05 LAB — KAPPA/LAMBDA LIGHT CHAINS
Kappa free light chain: 23.1 mg/L — ABNORMAL HIGH (ref 3.3–19.4)
Kappa, lambda light chain ratio: 1.13 (ref 0.26–1.65)
Lambda free light chains: 20.5 mg/L (ref 5.7–26.3)

## 2023-02-06 ENCOUNTER — Other Ambulatory Visit: Payer: Self-pay

## 2023-02-06 DIAGNOSIS — C9010 Plasma cell leukemia not having achieved remission: Secondary | ICD-10-CM

## 2023-02-06 MED ORDER — LENALIDOMIDE 10 MG PO CAPS: 10.0000 mg | ORAL_CAPSULE | Freq: Every day | ORAL | 0 refills | Status: DC

## 2023-02-08 ENCOUNTER — Encounter: Payer: Self-pay | Admitting: Hematology

## 2023-02-08 LAB — MULTIPLE MYELOMA PANEL, SERUM
Albumin SerPl Elph-Mcnc: 3.9 g/dL (ref 2.9–4.4)
Albumin/Glob SerPl: 1.6 (ref 0.7–1.7)
Alpha 1: 0.2 g/dL (ref 0.0–0.4)
Alpha2 Glob SerPl Elph-Mcnc: 1 g/dL (ref 0.4–1.0)
B-Globulin SerPl Elph-Mcnc: 0.8 g/dL (ref 0.7–1.3)
Gamma Glob SerPl Elph-Mcnc: 0.7 g/dL (ref 0.4–1.8)
Globulin, Total: 2.6 g/dL (ref 2.2–3.9)
IgA: 71 mg/dL (ref 61–437)
IgG (Immunoglobin G), Serum: 763 mg/dL (ref 603–1613)
IgM (Immunoglobulin M), Srm: <5 mg/dL — ABNORMAL LOW (ref 20–172)
Total Protein ELP: 6.5 g/dL (ref 6.0–8.5)

## 2023-02-14 ENCOUNTER — Other Ambulatory Visit: Payer: Self-pay

## 2023-02-16 ENCOUNTER — Inpatient Hospital Stay: Payer: Medicare Other | Attending: Hematology

## 2023-02-16 ENCOUNTER — Other Ambulatory Visit: Payer: Self-pay | Admitting: Hematology

## 2023-02-16 ENCOUNTER — Inpatient Hospital Stay: Payer: Medicare Other

## 2023-02-16 ENCOUNTER — Other Ambulatory Visit: Payer: Self-pay

## 2023-02-16 VITALS — BP 122/76 | HR 70 | Temp 98.1°F | Resp 18 | Wt 160.0 lb

## 2023-02-16 DIAGNOSIS — Z7189 Other specified counseling: Secondary | ICD-10-CM

## 2023-02-16 DIAGNOSIS — C9001 Multiple myeloma in remission: Secondary | ICD-10-CM | POA: Diagnosis not present

## 2023-02-16 DIAGNOSIS — Z5111 Encounter for antineoplastic chemotherapy: Secondary | ICD-10-CM | POA: Insufficient documentation

## 2023-02-16 DIAGNOSIS — C9010 Plasma cell leukemia not having achieved remission: Secondary | ICD-10-CM

## 2023-02-16 DIAGNOSIS — C9011 Plasma cell leukemia in remission: Secondary | ICD-10-CM | POA: Insufficient documentation

## 2023-02-16 DIAGNOSIS — Z95828 Presence of other vascular implants and grafts: Secondary | ICD-10-CM

## 2023-02-16 DIAGNOSIS — M84551A Pathological fracture in neoplastic disease, right femur, initial encounter for fracture: Secondary | ICD-10-CM

## 2023-02-16 LAB — CBC WITH DIFFERENTIAL (CANCER CENTER ONLY)
Abs Immature Granulocytes: 0.02 10*3/uL (ref 0.00–0.07)
Basophils Absolute: 0.2 10*3/uL — ABNORMAL HIGH (ref 0.0–0.1)
Basophils Relative: 3 %
Eosinophils Absolute: 0.2 10*3/uL (ref 0.0–0.5)
Eosinophils Relative: 3 %
HCT: 36.8 % — ABNORMAL LOW (ref 39.0–52.0)
Hemoglobin: 12.8 g/dL — ABNORMAL LOW (ref 13.0–17.0)
Immature Granulocytes: 0 %
Lymphocytes Relative: 31 %
Lymphs Abs: 1.9 10*3/uL (ref 0.7–4.0)
MCH: 32.2 pg (ref 26.0–34.0)
MCHC: 34.8 g/dL (ref 30.0–36.0)
MCV: 92.5 fL (ref 80.0–100.0)
Monocytes Absolute: 0.9 10*3/uL (ref 0.1–1.0)
Monocytes Relative: 14 %
Neutro Abs: 3.1 10*3/uL (ref 1.7–7.7)
Neutrophils Relative %: 49 %
Platelet Count: 250 10*3/uL (ref 150–400)
RBC: 3.98 MIL/uL — ABNORMAL LOW (ref 4.22–5.81)
RDW: 15.9 % — ABNORMAL HIGH (ref 11.5–15.5)
WBC Count: 6.3 10*3/uL (ref 4.0–10.5)
nRBC: 0 % (ref 0.0–0.2)

## 2023-02-16 LAB — CMP (CANCER CENTER ONLY)
ALT: 11 U/L (ref 0–44)
AST: 12 U/L — ABNORMAL LOW (ref 15–41)
Albumin: 4 g/dL (ref 3.5–5.0)
Alkaline Phosphatase: 51 U/L (ref 38–126)
Anion gap: 6 (ref 5–15)
BUN: 21 mg/dL (ref 8–23)
CO2: 23 mmol/L (ref 22–32)
Calcium: 8.2 mg/dL — ABNORMAL LOW (ref 8.9–10.3)
Chloride: 111 mmol/L (ref 98–111)
Creatinine: 1.45 mg/dL — ABNORMAL HIGH (ref 0.61–1.24)
GFR, Estimated: 53 mL/min — ABNORMAL LOW (ref >60)
Glucose, Bld: 115 mg/dL — ABNORMAL HIGH (ref 70–99)
Potassium: 4 mmol/L (ref 3.5–5.1)
Sodium: 140 mmol/L (ref 135–145)
Total Bilirubin: 0.5 mg/dL (ref 0.3–1.2)
Total Protein: 6.7 g/dL (ref 6.5–8.1)

## 2023-02-16 MED ORDER — DEXTROSE 5 % IV SOLN
56.0000 mg/m2 | Freq: Once | INTRAVENOUS | Status: AC
  Administered 2023-02-16: 100 mg via INTRAVENOUS
  Filled 2023-02-16: qty 30

## 2023-02-16 MED ORDER — FAMOTIDINE IN NACL 20-0.9 MG/50ML-% IV SOLN
20.0000 mg | Freq: Once | INTRAVENOUS | Status: AC
  Administered 2023-02-16: 20 mg via INTRAVENOUS
  Filled 2023-02-16: qty 50

## 2023-02-16 MED ORDER — SODIUM CHLORIDE 0.9 % IV SOLN: Freq: Once | INTRAVENOUS | Status: AC

## 2023-02-16 MED ORDER — SODIUM CHLORIDE 0.9% FLUSH
10.0000 mL | INTRAVENOUS | Status: DC | PRN
  Administered 2023-02-16: 10 mL

## 2023-02-16 MED ORDER — HEPARIN SOD (PORK) LOCK FLUSH 100 UNIT/ML IV SOLN
500.0000 [IU] | Freq: Once | INTRAVENOUS | Status: AC | PRN
  Administered 2023-02-16: 500 [IU]

## 2023-02-16 MED ORDER — SODIUM CHLORIDE 0.9% FLUSH
10.0000 mL | Freq: Once | INTRAVENOUS | Status: AC
  Administered 2023-02-16: 10 mL

## 2023-02-16 MED ORDER — ACETAMINOPHEN 325 MG PO TABS
650.0000 mg | ORAL_TABLET | Freq: Once | ORAL | Status: AC
  Administered 2023-02-16: 650 mg via ORAL
  Filled 2023-02-16: qty 2

## 2023-02-16 MED ORDER — SODIUM CHLORIDE 0.9 % IV SOLN
10.0000 mg | Freq: Once | INTRAVENOUS | Status: AC
  Administered 2023-02-16: 10 mg via INTRAVENOUS
  Filled 2023-02-16: qty 10

## 2023-02-16 NOTE — Patient Instructions (Signed)
[facility]  Discharge Instructions: Thank you for choosing [facility] to provide your oncology and hematology care.   If you have a lab appointment with the Cancer Center, please go directly to the Cancer Center and check in at the registration area.   Wear comfortable clothing and clothing appropriate for easy access to any Portacath or PICC line.   We strive to give you quality time with your provider. You may need to reschedule your appointment if you arrive late (15 or more minutes).  Arriving late affects you and other patients whose appointments are after yours.  Also, if you miss three or more appointments without notifying the office, you may be dismissed from the clinic at the provider's discretion.      For prescription refill requests, have your pharmacy contact our office and allow 72 hours for refills to be completed.    Today you received the following chemotherapy and/or immunotherapy agents Krypolis      To help prevent nausea and vomiting after your treatment, we encourage you to take your nausea medication as directed.  BELOW ARE SYMPTOMS THAT SHOULD BE REPORTED IMMEDIATELY: *FEVER GREATER THAN 100.4 F (38 C) OR HIGHER *CHILLS OR SWEATING *NAUSEA AND VOMITING THAT IS NOT CONTROLLED WITH YOUR NAUSEA MEDICATION *UNUSUAL SHORTNESS OF BREATH *UNUSUAL BRUISING OR BLEEDING *URINARY PROBLEMS (pain or burning when urinating, or frequent urination) *BOWEL PROBLEMS (unusual diarrhea, constipation, pain near the anus) TENDERNESS IN MOUTH AND THROAT WITH OR WITHOUT PRESENCE OF ULCERS (sore throat, sores in mouth, or a toothache) UNUSUAL RASH, SWELLING OR PAIN  UNUSUAL VAGINAL DISCHARGE OR ITCHING   Items with * indicate a potential emergency and should be followed up as soon as possible or go to the Emergency Department if any problems should occur.  Please show the CHEMOTHERAPY ALERT CARD or IMMUNOTHERAPY ALERT CARD at  check-in to the Emergency Department and triage nurse.  Should you have questions after your visit or need to cancel or reschedule your appointment, please contact [facility]  Dept: [phone]  and follow the prompts.  Office hours are 8:00 a.m. to 4:30 p.m. Monday - Friday. Please note that voicemails left after 4:00 p.m. may not be returned until the following business day.  We are closed weekends and major holidays. You have access to a nurse at all times for urgent questions. Please call the main number to the clinic Dept: [phone] and follow the prompts.   For any non-urgent questions, you may also contact your provider using MyChart. We now offer e-Visits for anyone 18 and older to request care online for non-urgent symptoms. For details visit [email or web address].   Also download the MyChart app! Go to the app store, search "MyChart", open the app, select [place] [facility], and log in with your MyChart username and password.   

## 2023-02-16 NOTE — Progress Notes (Signed)
Discussed patient's concerns about nose blees with Dr. Irene Limbo. Not frequent- MD stated to tell patient to use saline nasal spray 2-3X's a day and seal with vaseline. Discussed with patient. Does not interfere with treatment today.

## 2023-02-20 ENCOUNTER — Other Ambulatory Visit: Payer: Self-pay | Admitting: Physician Assistant

## 2023-03-02 ENCOUNTER — Inpatient Hospital Stay: Payer: Medicare Other | Admitting: Hematology

## 2023-03-02 ENCOUNTER — Inpatient Hospital Stay: Payer: Medicare Other

## 2023-03-02 ENCOUNTER — Other Ambulatory Visit: Payer: Self-pay

## 2023-03-02 VITALS — BP 137/79 | HR 79 | Temp 97.9°F | Resp 20 | Wt 160.0 lb

## 2023-03-02 DIAGNOSIS — M84551A Pathological fracture in neoplastic disease, right femur, initial encounter for fracture: Secondary | ICD-10-CM

## 2023-03-02 DIAGNOSIS — C9010 Plasma cell leukemia not having achieved remission: Secondary | ICD-10-CM

## 2023-03-02 DIAGNOSIS — Z5111 Encounter for antineoplastic chemotherapy: Secondary | ICD-10-CM

## 2023-03-02 DIAGNOSIS — Z7189 Other specified counseling: Secondary | ICD-10-CM

## 2023-03-02 DIAGNOSIS — Z95828 Presence of other vascular implants and grafts: Secondary | ICD-10-CM

## 2023-03-02 LAB — CBC WITH DIFFERENTIAL (CANCER CENTER ONLY)
Abs Immature Granulocytes: 0.02 10*3/uL (ref 0.00–0.07)
Basophils Absolute: 0.1 10*3/uL (ref 0.0–0.1)
Basophils Relative: 2 %
Eosinophils Absolute: 0.3 10*3/uL (ref 0.0–0.5)
Eosinophils Relative: 4 %
HCT: 39.6 % (ref 39.0–52.0)
Hemoglobin: 13.8 g/dL (ref 13.0–17.0)
Immature Granulocytes: 0 %
Lymphocytes Relative: 27 %
Lymphs Abs: 2 10*3/uL (ref 0.7–4.0)
MCH: 32.5 pg (ref 26.0–34.0)
MCHC: 34.8 g/dL (ref 30.0–36.0)
MCV: 93.4 fL (ref 80.0–100.0)
Monocytes Absolute: 1.1 10*3/uL — ABNORMAL HIGH (ref 0.1–1.0)
Monocytes Relative: 15 %
Neutro Abs: 4 10*3/uL (ref 1.7–7.7)
Neutrophils Relative %: 52 %
Platelet Count: 238 10*3/uL (ref 150–400)
RBC: 4.24 MIL/uL (ref 4.22–5.81)
RDW: 15.7 % — ABNORMAL HIGH (ref 11.5–15.5)
WBC Count: 7.6 10*3/uL (ref 4.0–10.5)
nRBC: 0 % (ref 0.0–0.2)

## 2023-03-02 LAB — CMP (CANCER CENTER ONLY)
ALT: 12 U/L (ref 0–44)
AST: 10 U/L — ABNORMAL LOW (ref 15–41)
Albumin: 4.2 g/dL (ref 3.5–5.0)
Alkaline Phosphatase: 55 U/L (ref 38–126)
Anion gap: 6 (ref 5–15)
BUN: 24 mg/dL — ABNORMAL HIGH (ref 8–23)
CO2: 24 mmol/L (ref 22–32)
Calcium: 8.9 mg/dL (ref 8.9–10.3)
Chloride: 111 mmol/L (ref 98–111)
Creatinine: 1.56 mg/dL — ABNORMAL HIGH (ref 0.61–1.24)
GFR, Estimated: 49 mL/min — ABNORMAL LOW (ref >60)
Glucose, Bld: 128 mg/dL — ABNORMAL HIGH (ref 70–99)
Potassium: 4.1 mmol/L (ref 3.5–5.1)
Sodium: 141 mmol/L (ref 135–145)
Total Bilirubin: 0.5 mg/dL (ref 0.3–1.2)
Total Protein: 6.7 g/dL (ref 6.5–8.1)

## 2023-03-02 MED ORDER — SODIUM CHLORIDE 0.9% FLUSH
10.0000 mL | INTRAVENOUS | Status: DC | PRN
  Administered 2023-03-02: 10 mL

## 2023-03-02 MED ORDER — DEXAMETHASONE SODIUM PHOSPHATE 10 MG/ML IJ SOLN
6.0000 mg | Freq: Once | INTRAMUSCULAR | Status: AC
  Administered 2023-03-02: 6 mg via INTRAVENOUS
  Filled 2023-03-02: qty 1

## 2023-03-02 MED ORDER — SODIUM CHLORIDE 0.9 % IV SOLN: Freq: Once | INTRAVENOUS | Status: AC

## 2023-03-02 MED ORDER — DEXTROSE 5 % IV SOLN
56.0000 mg/m2 | Freq: Once | INTRAVENOUS | Status: AC
  Administered 2023-03-02: 100 mg via INTRAVENOUS
  Filled 2023-03-02: qty 30

## 2023-03-02 MED ORDER — SODIUM CHLORIDE 0.9% FLUSH
10.0000 mL | Freq: Once | INTRAVENOUS | Status: AC
  Administered 2023-03-02: 10 mL

## 2023-03-02 MED ORDER — ACETAMINOPHEN 325 MG PO TABS
650.0000 mg | ORAL_TABLET | Freq: Once | ORAL | Status: AC
  Administered 2023-03-02: 650 mg via ORAL
  Filled 2023-03-02: qty 2

## 2023-03-02 MED ORDER — FAMOTIDINE IN NACL 20-0.9 MG/50ML-% IV SOLN
20.0000 mg | Freq: Once | INTRAVENOUS | Status: AC
  Administered 2023-03-02: 20 mg via INTRAVENOUS
  Filled 2023-03-02: qty 50

## 2023-03-02 MED ORDER — SODIUM CHLORIDE 0.9 % IV SOLN: 6.0000 mg | Freq: Once | INTRAVENOUS | Status: DC

## 2023-03-02 MED ORDER — HEPARIN SOD (PORK) LOCK FLUSH 100 UNIT/ML IV SOLN
500.0000 [IU] | Freq: Once | INTRAVENOUS | Status: AC | PRN
  Administered 2023-03-02: 500 [IU]

## 2023-03-02 NOTE — Patient Instructions (Signed)

## 2023-03-02 NOTE — Patient Instructions (Signed)
[facility]  Discharge Instructions: Thank you for choosing [facility] to provide your oncology and hematology care.   If you have a lab appointment with the Cancer Center, please go directly to the Cancer Center and check in at the registration area.   Wear comfortable clothing and clothing appropriate for easy access to any Portacath or PICC line.   We strive to give you quality time with your provider. You may need to reschedule your appointment if you arrive late (15 or more minutes).  Arriving late affects you and other patients whose appointments are after yours.  Also, if you miss three or more appointments without notifying the office, you may be dismissed from the clinic at the provider's discretion.      For prescription refill requests, have your pharmacy contact our office and allow 72 hours for refills to be completed.    Today you received the following chemotherapy and/or immunotherapy agents Krypolis      To help prevent nausea and vomiting after your treatment, we encourage you to take your nausea medication as directed.  BELOW ARE SYMPTOMS THAT SHOULD BE REPORTED IMMEDIATELY: *FEVER GREATER THAN 100.4 F (38 C) OR HIGHER *CHILLS OR SWEATING *NAUSEA AND VOMITING THAT IS NOT CONTROLLED WITH YOUR NAUSEA MEDICATION *UNUSUAL SHORTNESS OF BREATH *UNUSUAL BRUISING OR BLEEDING *URINARY PROBLEMS (pain or burning when urinating, or frequent urination) *BOWEL PROBLEMS (unusual diarrhea, constipation, pain near the anus) TENDERNESS IN MOUTH AND THROAT WITH OR WITHOUT PRESENCE OF ULCERS (sore throat, sores in mouth, or a toothache) UNUSUAL RASH, SWELLING OR PAIN  UNUSUAL VAGINAL DISCHARGE OR ITCHING   Items with * indicate a potential emergency and should be followed up as soon as possible or go to the Emergency Department if any problems should occur.  Please show the CHEMOTHERAPY ALERT CARD or IMMUNOTHERAPY ALERT CARD at  check-in to the Emergency Department and triage nurse.  Should you have questions after your visit or need to cancel or reschedule your appointment, please contact [facility]  Dept: [phone]  and follow the prompts.  Office hours are 8:00 a.m. to 4:30 p.m. Monday - Friday. Please note that voicemails left after 4:00 p.m. may not be returned until the following business day.  We are closed weekends and major holidays. You have access to a nurse at all times for urgent questions. Please call the main number to the clinic Dept: [phone] and follow the prompts.   For any non-urgent questions, you may also contact your provider using MyChart. We now offer e-Visits for anyone 18 and older to request care online for non-urgent symptoms. For details visit [email or web address].   Also download the MyChart app! Go to the app store, search "MyChart", open the app, select [facility], and log in with your MyChart username and password.   

## 2023-03-02 NOTE — Progress Notes (Signed)
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  INTERVAL HISTORY:   Andres Fernandez is here for continued evaluation and management of his multiple myeloma and continued carfilzomib and Revlimid maintenance.    Patient was last seen by me on 02/02/2023 and complained of occasional pain in his left neck, but was otherwise doing well overall with no new medical concerns   Today, he reports that he is feeling well overall. He denies any pain and his epistaxis has significantly improved. He has not had any issues tolerating his Revlimid or IV infusions. He denies any infection issues, mouth sores, diarrhea, leg swelling  He does report some muscle cramping, though it has not been daily. He does complain of headaches later in the day after receiving treatment. To manage this, he does take two tables of Tylenol. He has not had headaches after previous treatments.   He denies any seasonal allergies. He notes that he does regularly drink plenty of water. He reports that he plans to travel to [facility] in November 2024.  REVIEW OF SYSTEMS:    10 Point review of Systems was done is negative except as noted above.   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   Tobacco abuse     SURGICAL HISTORY: Past Surgical History:  Procedure Laterality Date   APPENDECTOMY     FEMUR IM NAIL Right 11/20/2017   Procedure: RIGHT FEMORAL INTRAMEDULLARY (IM) NAIL;  Surgeon: Nicholes Stairs, MD;  Location: [facility];  Service: Orthopedics;  Laterality: Right;   IR IMAGING GUIDED PORT INSERTION  08/19/2021    SOCIAL HISTORY: Social History    Socioeconomic History   Marital status: Single    Spouse name: Not on file   Number of children: Not on file   Years of education: Not on file   Highest education level: Not on file  Occupational History   Not on file  Tobacco Use   Smoking status: Every Day    Packs/day: .25    Types: Cigarettes    Last attempt to quit: 11/09/2017    Years since quitting: 5.3   Smokeless tobacco: Never   Tobacco comments:    1 cigarette/day as of 09/25/18  Vaping Use   Vaping Use: Never used  Substance and Sexual Activity   Alcohol use: Yes   Drug use: No   Sexual activity: Not on file  Other Topics Concern   Not on file  Social History Narrative   Not on file   Social Determinants of Health   Financial Resource Strain: Not on file  Food Insecurity: Not on file  Transportation Needs: Not on file  Physical Activity: Not on file  Stress: Not on file  Social Connections: Not on file  Intimate Partner Violence: Not on file    FAMILY HISTORY: Family History  Problem Relation Age of Onset   COPD Mother    Liver cancer Mother     ALLERGIES:  has No Known Allergies.  MEDICATIONS:  . Current Outpatient Medications on File Prior to Visit  Medication Sig Dispense Refill   acyclovir (ZOVIRAX) 400 MG tablet Take 1 tablet (400 mg total) by mouth daily.  90 tablet 3   Albuterol Sulfate (PROAIR RESPICLICK) 123XX123 (90 Base) MCG/ACT AEPB Inhale into the lungs. (Patient not taking: Reported on 09/01/2022)     aspirin EC 81 MG tablet Take 1 tablet (81 mg total) by mouth daily. 60 tablet 11   azithromycin (ZITHROMAX) 250 MG tablet 2 tab (500mg ) for 1 days and then 1 tab (250mg ) daily for 4 days (Patient not taking: Reported on 11/24/2022) 6 each 0   cholecalciferol (VITAMIN D3) 10 MCG (400 UNIT) TABS tablet Take 1 tablet (400 Units total) by mouth at bedtime. (Patient not taking: Reported on 11/24/2022) 30 tablet 3   ferrous sulfate 325 (65 FE) MG EC tablet TAKE 1 TABLET BY MOUTH EVERY DAY WITH  BREAKFAST 90 tablet 1   FLOVENT DISKUS 250 MCG/ACT AEPB Inhale into the lungs. (Patient not taking: Reported on 09/01/2022)     ipratropium-albuterol (DUONEB) 0.5-2.5 (3) MG/3ML SOLN Inhale into the lungs. (Patient not taking: Reported on 09/01/2022)     lenalidomide (REVLIMID) 10 MG capsule Take 1 capsule (10 mg total) by mouth daily. Take for 21 days on, 7 days off, repeat every 28 days. 21 capsule 0   lidocaine-prilocaine (EMLA) cream Apply 1 application topically as needed. Apply to Port-A-Cath at least 1 hr prior to treatment. (Patient not taking: Reported on 11/24/2022) 30 g 0   Multiple Vitamins-Minerals (MULTIVITAMIN WITH MINERALS) tablet Take 1 tablet by mouth at bedtime.     nicotine (NICODERM CQ - DOSED IN MG/24 HOURS) 14 mg/24hr patch PLACE 1 PATCH (14 MG TOTAL) ONTO THE SKIN DAILY. (Patient not taking: Reported on 11/24/2022) 28 patch 1   ondansetron (ZOFRAN) 4 MG tablet Take 2 tablets (8 mg total) by mouth every 8 (eight) hours as needed for nausea. (Patient not taking: Reported on 09/01/2022) 30 tablet 0   predniSONE (DELTASONE) 20 MG tablet Take 2 tablets (40 mg total) by mouth daily with breakfast. For the next four days (Patient not taking: Reported on 09/01/2022) 8 tablet 0   Vitamin D, Ergocalciferol, (DRISDOL) 1.25 MG (50000 UNIT) CAPS capsule Take 1 capsule (50,000 Units total) by mouth once a week. (Patient not taking: Reported on 11/24/2022) 12 capsule 6   Current Facility-Administered Medications on File Prior to Visit  Medication Dose Route Frequency Provider Last Rate Last Admin   acetaminophen (TYLENOL) 325 MG tablet            famotidine (PEPCID) 20 MG tablet             PHYSICAL EXAMINATION:  ECOG FS:1 - Symptomatic but completely ambulatory Vitals:   03/02/23 1015  BP: 137/79  Pulse: 79  Resp: 20  Temp: 97.9 F (36.6 C)  SpO2: 100%    Wt Readings from Last 3 Encounters:  03/02/23 160 lb (72.6 kg)  02/16/23 160 lb (72.6 kg)  02/02/23 162 lb 14.4 oz (73.9 kg)     GENERAL:alert, in no acute distress and comfortable SKIN: no acute rashes, no significant lesions EYES: conjunctiva are pink and non-injected, sclera anicteric OROPHARYNX: MMM, no exudates, no oropharyngeal erythema or ulceration NECK: supple, no JVD LYMPH:  no palpable lymphadenopathy in the cervical, axillary or inguinal regions LUNGS: clear to auscultation b/l with normal respiratory effort HEART: regular rate & rhythm ABDOMEN:  normoactive bowel sounds , non tender, not distended. Extremity: no pedal edema PSYCH: alert & oriented x 3 with fluent speech NEURO: no focal motor/sensory deficits   LABORATORY DATA:  I have reviewed the data as listed  Latest Ref Rng & Units 03/02/2023   10:00 AM 02/16/2023    8:40 AM 02/02/2023   10:22 AM  CBC  WBC 4.0 - 10.5 K/uL 7.6  6.3  7.1   Hemoglobin 13.0 - 17.0 g/dL 13.8  12.8  12.8   Hematocrit 39.0 - 52.0 % 39.6  36.8  35.8   Platelets 150 - 400 K/uL 238  250  240    CBC    Component Value Date/Time   WBC 7.6 03/02/2023 1000   WBC 10.1 08/23/2022 0835   RBC 4.24 03/02/2023 1000   HGB 13.8 03/02/2023 1000   HGB 8.3 (L) 12/14/2017 1230   HCT 39.6 03/02/2023 1000   HCT 23.9 (L) 12/14/2017 1230   PLT 238 03/02/2023 1000   PLT 524 (H) 12/14/2017 1230   MCV 93.4 03/02/2023 1000   MCV 88.4 12/14/2017 1230   MCH 32.5 03/02/2023 1000   MCHC 34.8 03/02/2023 1000   RDW 15.7 (H) 03/02/2023 1000   RDW 15.3 (H) 12/14/2017 1230   LYMPHSABS 2.0 03/02/2023 1000   LYMPHSABS 1.4 12/14/2017 1230   MONOABS 1.1 (H) 03/02/2023 1000   MONOABS 0.6 12/14/2017 1230   EOSABS 0.3 03/02/2023 1000   EOSABS 0.1 12/14/2017 1230   BASOSABS 0.1 03/02/2023 1000   BASOSABS 0.1 12/14/2017 1230        Latest Ref Rng & Units 02/16/2023    8:40 AM 02/02/2023   10:22 AM 01/19/2023    8:23 AM  CMP  Glucose 70 - 99 mg/dL 115  88  112   BUN 8 - 23 mg/dL 21  23  21    Creatinine 0.61 - 1.24 mg/dL 1.45  1.44  1.49   Sodium 135 - 145 mmol/L 140  140  141    Potassium 3.5 - 5.1 mmol/L 4.0  4.3  4.3   Chloride 98 - 111 mmol/L 111  108  110   CO2 22 - 32 mmol/L 23  25  24    Calcium 8.9 - 10.3 mg/dL 8.2  8.6  9.1   Total Protein 6.5 - 8.1 g/dL 6.7  6.8  6.9   Total Bilirubin 0.3 - 1.2 mg/dL 0.5  0.5  0.5   Alkaline Phos 38 - 126 U/L 51  53  55   AST 15 - 41 U/L 12  11  13    ALT 0 - 44 U/L 11  11  10       03/09[facility]20 [facility][id number][facility]   07/02/2019 Bone Marrow Biopsy ([id number]) at [facility]    08/07/18 BM Bx:        RADIOGRAPHIC STUDIES: I have personally reviewed the radiological images as listed and agreed with the findings in the report. No results found.  ASSESSMENT & PLAN:    67 y.o. 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   Initial BM Bx showed >95% involvement with kappa restricted serum free light chains. 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 MoL Cy-   08/07/18 BM Bx revealed normocellular bone marrow with trilineage hematopoiesis and 1% plasma cells which indicates the pt is in remission 08/06/18 PET/CT revealed  Expansile lytic lesion involving the left iliac bone, unchanged. Lytic lesion involving the sternum,  unchanged. While both are suspicious for myeloma, neither lesion is FDG avid. No hypermetabolic osseous lesions in the visualized axial and appendicular skeleton. Please note that the bilateral lower extremities were not imaged. No suspicious lymphadenopathy. Spleen is normal in size.    Pt began Ninlaro 3mg  maintenance after pt finished 13 cycles of CyBorD and BM bx showed only 1% plasma cells, then resumed CyBorD and held Ninlaro in anticipation of his tandem bone marrow  transplants beginning in late December 2019 with Dr. Blinda [facility]wood [facility]   11/19/18 High dose chemotherapy with Melphalan 140mg /m2 and autologous stem cell rescue  03/20/19 High dose chemotherapy with Melphalan 200mg /m2 and autologous stem cell rescue. Non-infectious diarrhea, uncomplicated course otherwise.  07/02/2019 PET scan with results revealing "1. No FDG avid bone lesions are identified. 2.  Asymmetric uptake within the right, greater than left, palatine tonsils with mild fullness of the right tonsillar soft tissue. ENT consult vs attention on follow up suggested. 3.  Innumerable mixed sclerotic and lytic lesions throughout the axial and appendicular skeleton without associated hypermetabolic activity. 4.  Ancillary CT findings as above."  07/02/2019 bone marrow biopsy ([id number])with results showing: "Trilineage hematopoieses with maturation. No plasma cell myeloma identified. Mild anemia and No Rouleaux or circulating plasma cells identified in the peripheral blood."  2) s/p Prophylactic IM nailing for rt femur  completed Physical Therapy at the [facility].  -patient previously reported improvement and is currently progressively back to full time work.  3) Thrombocytopenia resolved   4) Renal insufficiency --likely related to multiple myeloma.  Remained fairly stable with baseline creatinine of 1.5-2.1.  Creatinine is 1.44 today Plan -maintain follow up with nephrology referral to Dr Marval Regal [facility] for continued treatment -Pt completed 8 weeks of maverick for hep c with Dr Linus Salmons on 07/08/18.  -Kidney numbers improved. Creatinine in the 1.7-2.2 range   5) h/o tobacco abuse-counseled on smoking cessation -Currently not smoking.   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 Hemoglobin is stable at 12.8  PLAN:  -discussed lab results on 03/02/2023 in detail with patient. CBC showed  WBC of 7.6K, hemoglobin of 13.8, and platelets of 238K -CBC normal -recommend patient to continue tylenol after receiving treatment -informed patient  that epistaxis may be due to home heating system -recommend patient to minimize heat and use a saline spray or humidifier, to improve epistaxis symptoms -Patient has no lab or clinical evidence of progression of his plasma cell leukemia/multiple myeloma. -He will continue his current carfilzomib and Revlimid maintenance. -We shall continue to minimize Dexramethasone as much as possible to minimize pt's steroid exposure over time -Switch Aredia to every 6 months for maintenance  FOLLOW-UP: Changing Aredia to every 6 months Continue Maintenance Carfilzomib every 2 weeks with portflush and labs Next MD visit in 2 months  The total time spent in the appointment was 25 minutes* .  All of the patient's questions were answered with apparent satisfaction. The patient knows to call the clinic with any problems, questions or concerns.   Sullivan Lone MD MS AAHIVMS [facility] Hematology/Oncology Physician [facility]  .*Total Encounter Time as defined by the Centers for Medicare and Medicaid Services includes, in addition to the face-to-face time of a patient visit (documented in the note above) non-face-to-face time: obtaining and reviewing outside history, ordering and reviewing medications, tests or procedures, care coordination (communications with other health care professionals  or caregivers) and documentation in the medical record.    I,Mitra Faeizi,acting as a Education administrator for Sullivan Lone, MD.,have documented all relevant documentation on the behalf of Sullivan Lone, MD,as directed by  Sullivan Lone, MD while in the presence of Sullivan Lone, MD.  .I have reviewed the above documentation for accuracy and completeness, and I agree with the above. Brunetta Genera MD

## 2023-03-02 NOTE — Progress Notes (Signed)
Patient seen by MD today  Vitals are within treatment parameters.  Labs reviewed: and are not all within treatment parameters. Dr Irene Limbo aware   CR: 1.56, all CBC and CMP results  Per physician team, patient is ready for treatment and there are NO modifications to the treatment plan.

## 2023-03-08 ENCOUNTER — Encounter: Payer: Self-pay | Admitting: Hematology

## 2023-03-08 ENCOUNTER — Other Ambulatory Visit: Payer: Self-pay

## 2023-03-08 DIAGNOSIS — C9010 Plasma cell leukemia not having achieved remission: Secondary | ICD-10-CM

## 2023-03-08 MED ORDER — LENALIDOMIDE 10 MG PO CAPS: 10.0000 mg | ORAL_CAPSULE | Freq: Every day | ORAL | 0 refills | Status: DC

## 2023-03-13 ENCOUNTER — Other Ambulatory Visit: Payer: Self-pay

## 2023-03-16 ENCOUNTER — Inpatient Hospital Stay: Payer: Medicare Other

## 2023-03-16 ENCOUNTER — Inpatient Hospital Stay: Payer: Medicare Other | Attending: Hematology

## 2023-03-16 ENCOUNTER — Other Ambulatory Visit: Payer: Self-pay

## 2023-03-16 VITALS — BP 128/79 | HR 75 | Temp 98.1°F | Resp 19 | Wt 158.5 lb

## 2023-03-16 DIAGNOSIS — C9011 Plasma cell leukemia in remission: Secondary | ICD-10-CM | POA: Insufficient documentation

## 2023-03-16 DIAGNOSIS — Z7189 Other specified counseling: Secondary | ICD-10-CM

## 2023-03-16 DIAGNOSIS — M84551A Pathological fracture in neoplastic disease, right femur, initial encounter for fracture: Secondary | ICD-10-CM

## 2023-03-16 DIAGNOSIS — C9010 Plasma cell leukemia not having achieved remission: Secondary | ICD-10-CM

## 2023-03-16 DIAGNOSIS — C9001 Multiple myeloma in remission: Secondary | ICD-10-CM | POA: Insufficient documentation

## 2023-03-16 DIAGNOSIS — Z5111 Encounter for antineoplastic chemotherapy: Secondary | ICD-10-CM | POA: Diagnosis present

## 2023-03-16 DIAGNOSIS — Z95828 Presence of other vascular implants and grafts: Secondary | ICD-10-CM

## 2023-03-16 LAB — CMP (CANCER CENTER ONLY)
ALT: 11 U/L (ref 0–44)
AST: 10 U/L — ABNORMAL LOW (ref 15–41)
Albumin: 4 g/dL (ref 3.5–5.0)
Alkaline Phosphatase: 64 U/L (ref 38–126)
Anion gap: 8 (ref 5–15)
BUN: 23 mg/dL (ref 8–23)
CO2: 24 mmol/L (ref 22–32)
Calcium: 9.2 mg/dL (ref 8.9–10.3)
Chloride: 110 mmol/L (ref 98–111)
Creatinine: 1.6 mg/dL — ABNORMAL HIGH (ref 0.61–1.24)
GFR, Estimated: 48 mL/min — ABNORMAL LOW (ref >60)
Glucose, Bld: 114 mg/dL — ABNORMAL HIGH (ref 70–99)
Potassium: 4 mmol/L (ref 3.5–5.1)
Sodium: 142 mmol/L (ref 135–145)
Total Bilirubin: 0.5 mg/dL (ref 0.3–1.2)
Total Protein: 7 g/dL (ref 6.5–8.1)

## 2023-03-16 LAB — CBC WITH DIFFERENTIAL (CANCER CENTER ONLY)
Abs Immature Granulocytes: 0.03 10*3/uL (ref 0.00–0.07)
Basophils Absolute: 0.2 10*3/uL — ABNORMAL HIGH (ref 0.0–0.1)
Basophils Relative: 2 %
Eosinophils Absolute: 0.3 10*3/uL (ref 0.0–0.5)
Eosinophils Relative: 4 %
HCT: 38.7 % — ABNORMAL LOW (ref 39.0–52.0)
Hemoglobin: 13.4 g/dL (ref 13.0–17.0)
Immature Granulocytes: 0 %
Lymphocytes Relative: 25 %
Lymphs Abs: 1.7 10*3/uL (ref 0.7–4.0)
MCH: 32 pg (ref 26.0–34.0)
MCHC: 34.6 g/dL (ref 30.0–36.0)
MCV: 92.4 fL (ref 80.0–100.0)
Monocytes Absolute: 0.6 10*3/uL (ref 0.1–1.0)
Monocytes Relative: 9 %
Neutro Abs: 4.1 10*3/uL (ref 1.7–7.7)
Neutrophils Relative %: 60 %
Platelet Count: 271 10*3/uL (ref 150–400)
RBC: 4.19 MIL/uL — ABNORMAL LOW (ref 4.22–5.81)
RDW: 14.6 % (ref 11.5–15.5)
WBC Count: 6.9 10*3/uL (ref 4.0–10.5)
nRBC: 0 % (ref 0.0–0.2)

## 2023-03-16 MED ORDER — SODIUM CHLORIDE 0.9% FLUSH
10.0000 mL | INTRAVENOUS | Status: DC | PRN
  Administered 2023-03-16: 10 mL

## 2023-03-16 MED ORDER — ACETAMINOPHEN 325 MG PO TABS
650.0000 mg | ORAL_TABLET | Freq: Once | ORAL | Status: AC
  Administered 2023-03-16: 650 mg via ORAL
  Filled 2023-03-16: qty 2

## 2023-03-16 MED ORDER — DEXTROSE 5 % IV SOLN
56.0000 mg/m2 | Freq: Once | INTRAVENOUS | Status: AC
  Administered 2023-03-16: 100 mg via INTRAVENOUS
  Filled 2023-03-16: qty 30

## 2023-03-16 MED ORDER — DEXAMETHASONE SODIUM PHOSPHATE 10 MG/ML IJ SOLN
6.0000 mg | Freq: Once | INTRAMUSCULAR | Status: AC
  Administered 2023-03-16: 6 mg via INTRAVENOUS
  Filled 2023-03-16: qty 1

## 2023-03-16 MED ORDER — FAMOTIDINE IN NACL 20-0.9 MG/50ML-% IV SOLN
20.0000 mg | Freq: Once | INTRAVENOUS | Status: AC
  Administered 2023-03-16: 20 mg via INTRAVENOUS
  Filled 2023-03-16: qty 50

## 2023-03-16 MED ORDER — HEPARIN SOD (PORK) LOCK FLUSH 100 UNIT/ML IV SOLN
500.0000 [IU] | Freq: Once | INTRAVENOUS | Status: AC | PRN
  Administered 2023-03-16: 500 [IU]

## 2023-03-16 MED ORDER — SODIUM CHLORIDE 0.9% FLUSH
10.0000 mL | Freq: Once | INTRAVENOUS | Status: AC
  Administered 2023-03-16: 10 mL

## 2023-03-16 MED ORDER — SODIUM CHLORIDE 0.9 % IV SOLN: Freq: Once | INTRAVENOUS | Status: AC

## 2023-03-16 NOTE — Progress Notes (Signed)
Per Dr Leonides Schanz ok to tx with Scr of 1.6.

## 2023-03-16 NOTE — Patient Instructions (Signed)
[facility]  Discharge Instructions: Thank you for choosing [facility] to provide your oncology and hematology care.   If you have a lab appointment with the Cancer Center, please go directly to the Cancer Center and check in at the registration area.   Wear comfortable clothing and clothing appropriate for easy access to any Portacath or PICC line.   We strive to give you quality time with your provider. You may need to reschedule your appointment if you arrive late (15 or more minutes).  Arriving late affects you and other patients whose appointments are after yours.  Also, if you miss three or more appointments without notifying the office, you may be dismissed from the clinic at the provider's discretion.      For prescription refill requests, have your pharmacy contact our office and allow 72 hours for refills to be completed.    Today you received the following chemotherapy and/or immunotherapy agents :  Kyprolis   To help prevent nausea and vomiting after your treatment, we encourage you to take your nausea medication as directed.  BELOW ARE SYMPTOMS THAT SHOULD BE REPORTED IMMEDIATELY: *FEVER GREATER THAN 100.4 F (38 C) OR HIGHER *CHILLS OR SWEATING *NAUSEA AND VOMITING THAT IS NOT CONTROLLED WITH YOUR NAUSEA MEDICATION *UNUSUAL SHORTNESS OF BREATH *UNUSUAL BRUISING OR BLEEDING *URINARY PROBLEMS (pain or burning when urinating, or frequent urination) *BOWEL PROBLEMS (unusual diarrhea, constipation, pain near the anus) TENDERNESS IN MOUTH AND THROAT WITH OR WITHOUT PRESENCE OF ULCERS (sore throat, sores in mouth, or a toothache) UNUSUAL RASH, SWELLING OR PAIN  UNUSUAL VAGINAL DISCHARGE OR ITCHING   Items with * indicate a potential emergency and should be followed up as soon as possible or go to the Emergency Department if any problems should occur.  Please show the CHEMOTHERAPY ALERT CARD or IMMUNOTHERAPY ALERT CARD at  check-in to the Emergency Department and triage nurse.  Should you have questions after your visit or need to cancel or reschedule your appointment, please contact [facility]  Dept: [phone]  and follow the prompts.  Office hours are 8:00 a.m. to 4:30 p.m. Monday - Friday. Please note that voicemails left after 4:00 p.m. may not be returned until the following business day.  We are closed weekends and major holidays. You have access to a nurse at all times for urgent questions. Please call the main number to the clinic Dept: [phone] and follow the prompts.   For any non-urgent questions, you may also contact your provider using MyChart. We now offer e-Visits for anyone 18 and older to request care online for non-urgent symptoms. For details visit [email or web address].   Also download the MyChart app! Go to the app store, search "MyChart", open the app, select [facility], and log in with your MyChart username and password.   

## 2023-03-21 LAB — MULTIPLE MYELOMA PANEL, SERUM
Albumin SerPl Elph-Mcnc: 3.6 g/dL (ref 2.9–4.4)
Albumin/Glob SerPl: 1.3 (ref 0.7–1.7)
Alpha 1: 0.2 g/dL (ref 0.0–0.4)
Alpha2 Glob SerPl Elph-Mcnc: 1.1 g/dL — ABNORMAL HIGH (ref 0.4–1.0)
B-Globulin SerPl Elph-Mcnc: 0.9 g/dL (ref 0.7–1.3)
Gamma Glob SerPl Elph-Mcnc: 0.6 g/dL (ref 0.4–1.8)
Globulin, Total: 2.8 g/dL (ref 2.2–3.9)
IgA: 90 mg/dL (ref 61–437)
IgG (Immunoglobin G), Serum: 790 mg/dL (ref 603–1613)
IgM (Immunoglobulin M), Srm: <5 mg/dL — ABNORMAL LOW (ref 20–172)
Total Protein ELP: 6.4 g/dL (ref 6.0–8.5)

## 2023-03-27 ENCOUNTER — Other Ambulatory Visit: Payer: Self-pay

## 2023-03-30 ENCOUNTER — Inpatient Hospital Stay: Payer: Medicare Other

## 2023-03-30 VITALS — BP 135/80 | HR 68 | Temp 98.1°F | Resp 21 | Wt 158.0 lb

## 2023-03-30 DIAGNOSIS — Z7189 Other specified counseling: Secondary | ICD-10-CM

## 2023-03-30 DIAGNOSIS — C9010 Plasma cell leukemia not having achieved remission: Secondary | ICD-10-CM

## 2023-03-30 DIAGNOSIS — Z5111 Encounter for antineoplastic chemotherapy: Secondary | ICD-10-CM | POA: Diagnosis not present

## 2023-03-30 DIAGNOSIS — Z95828 Presence of other vascular implants and grafts: Secondary | ICD-10-CM

## 2023-03-30 DIAGNOSIS — M84551A Pathological fracture in neoplastic disease, right femur, initial encounter for fracture: Secondary | ICD-10-CM

## 2023-03-30 LAB — CMP (CANCER CENTER ONLY)
ALT: 11 U/L (ref 0–44)
AST: 12 U/L — ABNORMAL LOW (ref 15–41)
Albumin: 4 g/dL (ref 3.5–5.0)
Alkaline Phosphatase: 52 U/L (ref 38–126)
Anion gap: 8 (ref 5–15)
BUN: 19 mg/dL (ref 8–23)
CO2: 23 mmol/L (ref 22–32)
Calcium: 9 mg/dL (ref 8.9–10.3)
Chloride: 111 mmol/L (ref 98–111)
Creatinine: 1.53 mg/dL — ABNORMAL HIGH (ref 0.61–1.24)
GFR, Estimated: 50 mL/min — ABNORMAL LOW (ref >60)
Glucose, Bld: 104 mg/dL — ABNORMAL HIGH (ref 70–99)
Potassium: 4 mmol/L (ref 3.5–5.1)
Sodium: 142 mmol/L (ref 135–145)
Total Bilirubin: 0.5 mg/dL (ref 0.3–1.2)
Total Protein: 6.7 g/dL (ref 6.5–8.1)

## 2023-03-30 LAB — CBC WITH DIFFERENTIAL (CANCER CENTER ONLY)
Abs Immature Granulocytes: 0.02 10*3/uL (ref 0.00–0.07)
Basophils Absolute: 0.1 10*3/uL (ref 0.0–0.1)
Basophils Relative: 1 %
Eosinophils Absolute: 0.4 10*3/uL (ref 0.0–0.5)
Eosinophils Relative: 6 %
HCT: 38.8 % — ABNORMAL LOW (ref 39.0–52.0)
Hemoglobin: 13.2 g/dL (ref 13.0–17.0)
Immature Granulocytes: 0 %
Lymphocytes Relative: 28 %
Lymphs Abs: 1.9 10*3/uL (ref 0.7–4.0)
MCH: 32.3 pg (ref 26.0–34.0)
MCHC: 34 g/dL (ref 30.0–36.0)
MCV: 94.9 fL (ref 80.0–100.0)
Monocytes Absolute: 1 10*3/uL (ref 0.1–1.0)
Monocytes Relative: 14 %
Neutro Abs: 3.5 10*3/uL (ref 1.7–7.7)
Neutrophils Relative %: 51 %
Platelet Count: 178 10*3/uL (ref 150–400)
RBC: 4.09 MIL/uL — ABNORMAL LOW (ref 4.22–5.81)
RDW: 14.9 % (ref 11.5–15.5)
WBC Count: 6.7 10*3/uL (ref 4.0–10.5)
nRBC: 0 % (ref 0.0–0.2)

## 2023-03-30 MED ORDER — SODIUM CHLORIDE 0.9% FLUSH
10.0000 mL | INTRAVENOUS | Status: DC | PRN
  Administered 2023-03-30: 10 mL

## 2023-03-30 MED ORDER — FAMOTIDINE IN NACL 20-0.9 MG/50ML-% IV SOLN
20.0000 mg | Freq: Once | INTRAVENOUS | Status: AC
  Administered 2023-03-30: 20 mg via INTRAVENOUS
  Filled 2023-03-30: qty 50

## 2023-03-30 MED ORDER — DEXAMETHASONE SODIUM PHOSPHATE 10 MG/ML IJ SOLN
6.0000 mg | Freq: Once | INTRAMUSCULAR | Status: AC
  Administered 2023-03-30: 6 mg via INTRAVENOUS
  Filled 2023-03-30: qty 1

## 2023-03-30 MED ORDER — DEXTROSE 5 % IV SOLN
56.0000 mg/m2 | Freq: Once | INTRAVENOUS | Status: AC
  Administered 2023-03-30: 100 mg via INTRAVENOUS
  Filled 2023-03-30: qty 30

## 2023-03-30 MED ORDER — SODIUM CHLORIDE 0.9 % IV SOLN: Freq: Once | INTRAVENOUS | Status: AC

## 2023-03-30 MED ORDER — HEPARIN SOD (PORK) LOCK FLUSH 100 UNIT/ML IV SOLN
500.0000 [IU] | Freq: Once | INTRAVENOUS | Status: AC | PRN
  Administered 2023-03-30: 500 [IU]

## 2023-03-30 MED ORDER — ACETAMINOPHEN 325 MG PO TABS
650.0000 mg | ORAL_TABLET | Freq: Once | ORAL | Status: AC
  Administered 2023-03-30: 650 mg via ORAL
  Filled 2023-03-30: qty 2

## 2023-03-30 MED ORDER — SODIUM CHLORIDE 0.9% FLUSH
10.0000 mL | Freq: Once | INTRAVENOUS | Status: AC
  Administered 2023-03-30: 10 mL

## 2023-03-30 NOTE — Patient Instructions (Signed)
[facility]  Discharge Instructions: Thank you for choosing [facility] to provide your oncology and hematology care.   If you have a lab appointment with the Cancer Center, please go directly to the Cancer Center and check in at the registration area.   Wear comfortable clothing and clothing appropriate for easy access to any Portacath or PICC line.   We strive to give you quality time with your provider. You may need to reschedule your appointment if you arrive late (15 or more minutes).  Arriving late affects you and other patients whose appointments are after yours.  Also, if you miss three or more appointments without notifying the office, you may be dismissed from the clinic at the provider's discretion.      For prescription refill requests, have your pharmacy contact our office and allow 72 hours for refills to be completed.    Today you received the following chemotherapy and/or immunotherapy agents :  Kyprolis   To help prevent nausea and vomiting after your treatment, we encourage you to take your nausea medication as directed.  BELOW ARE SYMPTOMS THAT SHOULD BE REPORTED IMMEDIATELY: *FEVER GREATER THAN 100.4 F (38 C) OR HIGHER *CHILLS OR SWEATING *NAUSEA AND VOMITING THAT IS NOT CONTROLLED WITH YOUR NAUSEA MEDICATION *UNUSUAL SHORTNESS OF BREATH *UNUSUAL BRUISING OR BLEEDING *URINARY PROBLEMS (pain or burning when urinating, or frequent urination) *BOWEL PROBLEMS (unusual diarrhea, constipation, pain near the anus) TENDERNESS IN MOUTH AND THROAT WITH OR WITHOUT PRESENCE OF ULCERS (sore throat, sores in mouth, or a toothache) UNUSUAL RASH, SWELLING OR PAIN  UNUSUAL VAGINAL DISCHARGE OR ITCHING   Items with * indicate a potential emergency and should be followed up as soon as possible or go to the Emergency Department if any problems should occur.  Please show the CHEMOTHERAPY ALERT CARD or IMMUNOTHERAPY ALERT CARD at  check-in to the Emergency Department and triage nurse.  Should you have questions after your visit or need to cancel or reschedule your appointment, please contact [facility]  Dept: [phone]  and follow the prompts.  Office hours are 8:00 a.m. to 4:30 p.m. Monday - Friday. Please note that voicemails left after 4:00 p.m. may not be returned until the following business day.  We are closed weekends and major holidays. You have access to a nurse at all times for urgent questions. Please call the main number to the clinic Dept: [phone] and follow the prompts.   For any non-urgent questions, you may also contact your provider using MyChart. We now offer e-Visits for anyone 18 and older to request care online for non-urgent symptoms. For details visit [email or web address].   Also download the MyChart app! Go to the app store, search "MyChart", open the app, select [facility], and log in with your MyChart username and password.   

## 2023-03-30 NOTE — Progress Notes (Signed)
Dr Candise Che aware of CR: 1.53, pt is ok to tx today.

## 2023-04-02 ENCOUNTER — Other Ambulatory Visit: Payer: Self-pay

## 2023-04-02 DIAGNOSIS — C9010 Plasma cell leukemia not having achieved remission: Secondary | ICD-10-CM

## 2023-04-02 MED ORDER — LENALIDOMIDE 10 MG PO CAPS
10.0000 mg | ORAL_CAPSULE | Freq: Every day | ORAL | 0 refills | Status: DC
Start: 2023-04-02 — End: 2023-04-30

## 2023-04-11 ENCOUNTER — Encounter: Payer: Self-pay | Admitting: Hematology

## 2023-04-13 ENCOUNTER — Inpatient Hospital Stay: Payer: Medicare Other

## 2023-04-13 ENCOUNTER — Other Ambulatory Visit: Payer: Self-pay

## 2023-04-13 ENCOUNTER — Inpatient Hospital Stay: Payer: Medicare Other | Attending: Hematology

## 2023-04-13 VITALS — BP 128/82 | HR 80 | Temp 97.9°F | Resp 17 | Wt 158.2 lb

## 2023-04-13 DIAGNOSIS — Z7189 Other specified counseling: Secondary | ICD-10-CM

## 2023-04-13 DIAGNOSIS — Z5111 Encounter for antineoplastic chemotherapy: Secondary | ICD-10-CM | POA: Insufficient documentation

## 2023-04-13 DIAGNOSIS — C9010 Plasma cell leukemia not having achieved remission: Secondary | ICD-10-CM

## 2023-04-13 DIAGNOSIS — C9011 Plasma cell leukemia in remission: Secondary | ICD-10-CM | POA: Insufficient documentation

## 2023-04-13 DIAGNOSIS — C9001 Multiple myeloma in remission: Secondary | ICD-10-CM | POA: Diagnosis not present

## 2023-04-13 LAB — CBC WITH DIFFERENTIAL (CANCER CENTER ONLY)
Abs Immature Granulocytes: 0.07 10*3/uL (ref 0.00–0.07)
Basophils Absolute: 0.1 10*3/uL (ref 0.0–0.1)
Basophils Relative: 1 %
Eosinophils Absolute: 0.3 10*3/uL (ref 0.0–0.5)
Eosinophils Relative: 4 %
HCT: 39.1 % (ref 39.0–52.0)
Hemoglobin: 13.7 g/dL (ref 13.0–17.0)
Immature Granulocytes: 1 %
Lymphocytes Relative: 27 %
Lymphs Abs: 1.8 10*3/uL (ref 0.7–4.0)
MCH: 32.7 pg (ref 26.0–34.0)
MCHC: 35 g/dL (ref 30.0–36.0)
MCV: 93.3 fL (ref 80.0–100.0)
Monocytes Absolute: 0.5 10*3/uL (ref 0.1–1.0)
Monocytes Relative: 7 %
Neutro Abs: 4.2 10*3/uL (ref 1.7–7.7)
Neutrophils Relative %: 60 %
Platelet Count: 244 10*3/uL (ref 150–400)
RBC: 4.19 MIL/uL — ABNORMAL LOW (ref 4.22–5.81)
RDW: 15.1 % (ref 11.5–15.5)
WBC Count: 6.9 10*3/uL (ref 4.0–10.5)
nRBC: 0 % (ref 0.0–0.2)

## 2023-04-13 LAB — CMP (CANCER CENTER ONLY)
ALT: 10 U/L (ref 0–44)
AST: 12 U/L — ABNORMAL LOW (ref 15–41)
Albumin: 4.3 g/dL (ref 3.5–5.0)
Alkaline Phosphatase: 52 U/L (ref 38–126)
Anion gap: 7 (ref 5–15)
BUN: 19 mg/dL (ref 8–23)
CO2: 25 mmol/L (ref 22–32)
Calcium: 9 mg/dL (ref 8.9–10.3)
Chloride: 109 mmol/L (ref 98–111)
Creatinine: 1.57 mg/dL — ABNORMAL HIGH (ref 0.61–1.24)
GFR, Estimated: 49 mL/min — ABNORMAL LOW (ref >60)
Glucose, Bld: 126 mg/dL — ABNORMAL HIGH (ref 70–99)
Potassium: 3.9 mmol/L (ref 3.5–5.1)
Sodium: 141 mmol/L (ref 135–145)
Total Bilirubin: 0.5 mg/dL (ref 0.3–1.2)
Total Protein: 6.9 g/dL (ref 6.5–8.1)

## 2023-04-13 MED ORDER — SODIUM CHLORIDE 0.9 % IV SOLN: Freq: Once | INTRAVENOUS | Status: AC

## 2023-04-13 MED ORDER — FAMOTIDINE 20 MG IN NS 100 ML IVPB
20.0000 mg | Freq: Once | INTRAVENOUS | Status: AC
  Administered 2023-04-13: 20 mg via INTRAVENOUS
  Filled 2023-04-13: qty 100

## 2023-04-13 MED ORDER — HEPARIN SOD (PORK) LOCK FLUSH 100 UNIT/ML IV SOLN
500.0000 [IU] | Freq: Once | INTRAVENOUS | Status: AC | PRN
  Administered 2023-04-13: 500 [IU]

## 2023-04-13 MED ORDER — DEXTROSE 5 % IV SOLN
56.0000 mg/m2 | Freq: Once | INTRAVENOUS | Status: AC
  Administered 2023-04-13: 100 mg via INTRAVENOUS
  Filled 2023-04-13: qty 30

## 2023-04-13 MED ORDER — DEXAMETHASONE SODIUM PHOSPHATE 10 MG/ML IJ SOLN
6.0000 mg | Freq: Once | INTRAMUSCULAR | Status: AC
  Administered 2023-04-13: 6 mg via INTRAVENOUS
  Filled 2023-04-13: qty 1

## 2023-04-13 MED ORDER — SODIUM CHLORIDE 0.9% FLUSH
10.0000 mL | INTRAVENOUS | Status: DC | PRN
  Administered 2023-04-13: 10 mL

## 2023-04-13 MED ORDER — ACETAMINOPHEN 325 MG PO TABS
650.0000 mg | ORAL_TABLET | Freq: Once | ORAL | Status: AC
  Administered 2023-04-13: 650 mg via ORAL
  Filled 2023-04-13: qty 2

## 2023-04-13 NOTE — Progress Notes (Signed)
Okay to treat with creat 1.57 per Dr. Candise Che

## 2023-04-13 NOTE — Patient Instructions (Signed)
[facility]  Discharge Instructions: Thank you for choosing [facility] to provide your oncology and hematology care.   If you have a lab appointment with the Cancer Center, please go directly to the Cancer Center and check in at the registration area.   Wear comfortable clothing and clothing appropriate for easy access to any Portacath or PICC line.   We strive to give you quality time with your provider. You may need to reschedule your appointment if you arrive late (15 or more minutes).  Arriving late affects you and other patients whose appointments are after yours.  Also, if you miss three or more appointments without notifying the office, you may be dismissed from the clinic at the provider's discretion.      For prescription refill requests, have your pharmacy contact our office and allow 72 hours for refills to be completed.    Today you received the following chemotherapy and/or immunotherapy agents :  Kyprolis   To help prevent nausea and vomiting after your treatment, we encourage you to take your nausea medication as directed.  BELOW ARE SYMPTOMS THAT SHOULD BE REPORTED IMMEDIATELY: *FEVER GREATER THAN 100.4 F (38 C) OR HIGHER *CHILLS OR SWEATING *NAUSEA AND VOMITING THAT IS NOT CONTROLLED WITH YOUR NAUSEA MEDICATION *UNUSUAL SHORTNESS OF BREATH *UNUSUAL BRUISING OR BLEEDING *URINARY PROBLEMS (pain or burning when urinating, or frequent urination) *BOWEL PROBLEMS (unusual diarrhea, constipation, pain near the anus) TENDERNESS IN MOUTH AND THROAT WITH OR WITHOUT PRESENCE OF ULCERS (sore throat, sores in mouth, or a toothache) UNUSUAL RASH, SWELLING OR PAIN  UNUSUAL VAGINAL DISCHARGE OR ITCHING   Items with * indicate a potential emergency and should be followed up as soon as possible or go to the Emergency Department if any problems should occur.  Please show the CHEMOTHERAPY ALERT CARD or IMMUNOTHERAPY ALERT CARD at  check-in to the Emergency Department and triage nurse.  Should you have questions after your visit or need to cancel or reschedule your appointment, please contact [facility]  Dept: [phone]  and follow the prompts.  Office hours are 8:00 a.m. to 4:30 p.m. Monday - Friday. Please note that voicemails left after 4:00 p.m. may not be returned until the following business day.  We are closed weekends and major holidays. You have access to a nurse at all times for urgent questions. Please call the main number to the clinic Dept: [phone] and follow the prompts.   For any non-urgent questions, you may also contact your provider using MyChart. We now offer e-Visits for anyone 18 and older to request care online for non-urgent symptoms. For details visit [email or web address].   Also download the MyChart app! Go to the app store, search "MyChart", open the app, select [facility], and log in with your MyChart username and password.   

## 2023-04-16 LAB — KAPPA/LAMBDA LIGHT CHAINS
Kappa free light chain: 23 mg/L — ABNORMAL HIGH (ref 3.3–19.4)
Kappa, lambda light chain ratio: 1.22 (ref 0.26–1.65)
Lambda free light chains: 18.9 mg/L (ref 5.7–26.3)

## 2023-04-19 ENCOUNTER — Other Ambulatory Visit: Payer: Self-pay

## 2023-04-19 DIAGNOSIS — C9000 Multiple myeloma not having achieved remission: Secondary | ICD-10-CM

## 2023-04-26 ENCOUNTER — Encounter: Payer: Self-pay | Admitting: Hematology

## 2023-04-27 ENCOUNTER — Ambulatory Visit: Payer: Medicare Other | Admitting: Hematology

## 2023-04-27 ENCOUNTER — Other Ambulatory Visit: Payer: Medicare Other

## 2023-04-27 ENCOUNTER — Ambulatory Visit: Payer: Medicare Other

## 2023-04-30 ENCOUNTER — Other Ambulatory Visit: Payer: Self-pay

## 2023-04-30 DIAGNOSIS — C9010 Plasma cell leukemia not having achieved remission: Secondary | ICD-10-CM

## 2023-04-30 MED ORDER — LENALIDOMIDE 10 MG PO CAPS
10.0000 mg | ORAL_CAPSULE | Freq: Every day | ORAL | 0 refills | Status: DC
Start: 2023-04-30 — End: 2023-06-06

## 2023-05-01 ENCOUNTER — Other Ambulatory Visit: Payer: Self-pay

## 2023-05-11 ENCOUNTER — Inpatient Hospital Stay: Payer: Medicare Other

## 2023-05-11 ENCOUNTER — Inpatient Hospital Stay (HOSPITAL_BASED_OUTPATIENT_CLINIC_OR_DEPARTMENT_OTHER): Payer: Medicare Other | Admitting: Hematology

## 2023-05-11 VITALS — BP 128/85 | HR 79 | Temp 98.7°F | Resp 18 | Wt 157.4 lb

## 2023-05-11 DIAGNOSIS — C9010 Plasma cell leukemia not having achieved remission: Secondary | ICD-10-CM | POA: Diagnosis not present

## 2023-05-11 DIAGNOSIS — Z7189 Other specified counseling: Secondary | ICD-10-CM

## 2023-05-11 DIAGNOSIS — Z5111 Encounter for antineoplastic chemotherapy: Secondary | ICD-10-CM | POA: Diagnosis not present

## 2023-05-11 DIAGNOSIS — M84551A Pathological fracture in neoplastic disease, right femur, initial encounter for fracture: Secondary | ICD-10-CM

## 2023-05-11 DIAGNOSIS — Z95828 Presence of other vascular implants and grafts: Secondary | ICD-10-CM

## 2023-05-11 LAB — CBC WITH DIFFERENTIAL (CANCER CENTER ONLY)
Abs Immature Granulocytes: 0.03 K/uL (ref 0.00–0.07)
Basophils Absolute: 0.1 K/uL (ref 0.0–0.1)
Basophils Relative: 1 %
Eosinophils Absolute: 0.2 K/uL (ref 0.0–0.5)
Eosinophils Relative: 2 %
HCT: 35.1 % — ABNORMAL LOW (ref 39.0–52.0)
Hemoglobin: 12 g/dL — ABNORMAL LOW (ref 13.0–17.0)
Immature Granulocytes: 0 %
Lymphocytes Relative: 23 %
Lymphs Abs: 1.9 K/uL (ref 0.7–4.0)
MCH: 32.3 pg (ref 26.0–34.0)
MCHC: 34.2 g/dL (ref 30.0–36.0)
MCV: 94.4 fL (ref 80.0–100.0)
Monocytes Absolute: 1.1 K/uL — ABNORMAL HIGH (ref 0.1–1.0)
Monocytes Relative: 13 %
Neutro Abs: 5 K/uL (ref 1.7–7.7)
Neutrophils Relative %: 61 %
Platelet Count: 247 K/uL (ref 150–400)
RBC: 3.72 MIL/uL — ABNORMAL LOW (ref 4.22–5.81)
RDW: 14.5 % (ref 11.5–15.5)
WBC Count: 8.3 K/uL (ref 4.0–10.5)
nRBC: 0 % (ref 0.0–0.2)

## 2023-05-11 LAB — CMP (CANCER CENTER ONLY)
ALT: 10 U/L (ref 0–44)
AST: 10 U/L — ABNORMAL LOW (ref 15–41)
Albumin: 4 g/dL (ref 3.5–5.0)
Alkaline Phosphatase: 75 U/L (ref 38–126)
Anion gap: 9 (ref 5–15)
BUN: 22 mg/dL (ref 8–23)
CO2: 23 mmol/L (ref 22–32)
Calcium: 9.1 mg/dL (ref 8.9–10.3)
Chloride: 107 mmol/L (ref 98–111)
Creatinine: 1.53 mg/dL — ABNORMAL HIGH (ref 0.61–1.24)
GFR, Estimated: 50 mL/min — ABNORMAL LOW
Glucose, Bld: 112 mg/dL — ABNORMAL HIGH (ref 70–99)
Potassium: 4.2 mmol/L (ref 3.5–5.1)
Sodium: 139 mmol/L (ref 135–145)
Total Bilirubin: 0.4 mg/dL (ref 0.3–1.2)
Total Protein: 7.2 g/dL (ref 6.5–8.1)

## 2023-05-11 MED ORDER — SODIUM CHLORIDE 0.9 % IV SOLN: Freq: Once | INTRAVENOUS | Status: AC

## 2023-05-11 MED ORDER — FAMOTIDINE IN NACL 20-0.9 MG/50ML-% IV SOLN
20.0000 mg | Freq: Once | INTRAVENOUS | Status: AC
  Administered 2023-05-11: 20 mg via INTRAVENOUS
  Filled 2023-05-11: qty 50

## 2023-05-11 MED ORDER — SODIUM CHLORIDE 0.9% FLUSH
10.0000 mL | INTRAVENOUS | Status: DC | PRN
  Administered 2023-05-11: 10 mL

## 2023-05-11 MED ORDER — HEPARIN SOD (PORK) LOCK FLUSH 100 UNIT/ML IV SOLN
500.0000 [IU] | Freq: Once | INTRAVENOUS | Status: AC | PRN
  Administered 2023-05-11: 500 [IU]

## 2023-05-11 MED ORDER — DEXAMETHASONE SODIUM PHOSPHATE 10 MG/ML IJ SOLN
6.0000 mg | Freq: Once | INTRAMUSCULAR | Status: AC
  Administered 2023-05-11: 6 mg via INTRAVENOUS
  Filled 2023-05-11: qty 1

## 2023-05-11 MED ORDER — ACETAMINOPHEN 325 MG PO TABS
650.0000 mg | ORAL_TABLET | Freq: Once | ORAL | Status: AC
  Administered 2023-05-11: 650 mg via ORAL
  Filled 2023-05-11: qty 2

## 2023-05-11 MED ORDER — SODIUM CHLORIDE 0.9% FLUSH
10.0000 mL | Freq: Once | INTRAVENOUS | Status: AC
  Administered 2023-05-11: 10 mL

## 2023-05-11 MED ORDER — DEXTROSE 5 % IV SOLN
56.0000 mg/m2 | Freq: Once | INTRAVENOUS | Status: AC
  Administered 2023-05-11: 100 mg via INTRAVENOUS
  Filled 2023-05-11: qty 30

## 2023-05-11 NOTE — Patient Instructions (Signed)
[facility]  Discharge Instructions: Thank you for choosing [facility] to provide your oncology and hematology care.   If you have a lab appointment with the Cancer Center, please go directly to the Cancer Center and check in at the registration area.   Wear comfortable clothing and clothing appropriate for easy access to any Portacath or PICC line.   We strive to give you quality time with your provider. You may need to reschedule your appointment if you arrive late (15 or more minutes).  Arriving late affects you and other patients whose appointments are after yours.  Also, if you miss three or more appointments without notifying the office, you may be dismissed from the clinic at the provider's discretion.      For prescription refill requests, have your pharmacy contact our office and allow 72 hours for refills to be completed.    Today you received the following chemotherapy and/or immunotherapy agents :  Kyprolis   To help prevent nausea and vomiting after your treatment, we encourage you to take your nausea medication as directed.  BELOW ARE SYMPTOMS THAT SHOULD BE REPORTED IMMEDIATELY: *FEVER GREATER THAN 100.4 F (38 C) OR HIGHER *CHILLS OR SWEATING *NAUSEA AND VOMITING THAT IS NOT CONTROLLED WITH YOUR NAUSEA MEDICATION *UNUSUAL SHORTNESS OF BREATH *UNUSUAL BRUISING OR BLEEDING *URINARY PROBLEMS (pain or burning when urinating, or frequent urination) *BOWEL PROBLEMS (unusual diarrhea, constipation, pain near the anus) TENDERNESS IN MOUTH AND THROAT WITH OR WITHOUT PRESENCE OF ULCERS (sore throat, sores in mouth, or a toothache) UNUSUAL RASH, SWELLING OR PAIN  UNUSUAL VAGINAL DISCHARGE OR ITCHING   Items with * indicate a potential emergency and should be followed up as soon as possible or go to the Emergency Department if any problems should occur.  Please show the CHEMOTHERAPY ALERT CARD or IMMUNOTHERAPY ALERT CARD at  check-in to the Emergency Department and triage nurse.  Should you have questions after your visit or need to cancel or reschedule your appointment, please contact [facility]  Dept: [phone]  and follow the prompts.  Office hours are 8:00 a.m. to 4:30 p.m. Monday - Friday. Please note that voicemails left after 4:00 p.m. may not be returned until the following business day.  We are closed weekends and major holidays. You have access to a nurse at all times for urgent questions. Please call the main number to the clinic Dept: [phone] and follow the prompts.   For any non-urgent questions, you may also contact your provider using MyChart. We now offer e-Visits for anyone 18 and older to request care online for non-urgent symptoms. For details visit [email or web address].   Also download the MyChart app! Go to the app store, search "MyChart", open the app, select [facility], and log in with your MyChart username and password.   

## 2023-05-11 NOTE — Progress Notes (Signed)
Per Candise Che MD, ok to treat today with SCR 1.53.

## 2023-05-11 NOTE — Progress Notes (Signed)
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  INTERVAL HISTORY:   Andres Fernandez is here for continued evaluation and management of his multiple myeloma and continued Carfilzomib maintenance.    Patient was last seen by me on 03/02/2023 and reported improved epistaxis. He did complain of muscle cramping and headaches.  Today, he reports that he is feeling well overall. Patient is recovering from a penile implant at [facility]. He denies any issues with tolerating Carfilzomib. Patient has normal energy levels and denies any infection issues, fevers, chills, night sweats, new bone pains, diarrhea, or medication changes.  REVIEW OF SYSTEMS:    10 Point review of Systems was done is negative except as noted above.   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   Tobacco abuse     SURGICAL HISTORY: Past Surgical History:  Procedure Laterality Date   APPENDECTOMY     FEMUR IM NAIL Right 11/20/2017   Procedure: RIGHT FEMORAL INTRAMEDULLARY (IM) NAIL;  Surgeon: Yolonda Kida, MD;  Location: [facility] OR;  Service: Orthopedics;  Laterality: Right;   IR IMAGING GUIDED PORT INSERTION  08/19/2021    SOCIAL HISTORY: Social History   Socioeconomic History   Marital status: Single    Spouse name: Not on file   Number of children: Not on file   Years of education: Not on file   Highest education level: Not on file  Occupational History   Not on file  Tobacco Use   Smoking status: Every Day    Packs/day: .25    Types: Cigarettes    Last attempt to quit: 11/09/2017    Years since quitting: 5.5   Smokeless tobacco: Never    Tobacco comments:    1 cigarette/day as of 09/25/18  Vaping Use   Vaping Use: Never used  Substance and Sexual Activity   Alcohol use: Yes   Drug use: No   Sexual activity: Not on file  Other Topics Concern   Not on file  Social History Narrative   Not on file   Social Determinants of Health   Financial Resource Strain: Not on file  Food Insecurity: Not on file  Transportation Needs: Not on file  Physical Activity: Not on file  Stress: Not on file  Social Connections: Not on file  Intimate Partner Violence: Not on file    FAMILY HISTORY: Family History  Problem Relation Age of Onset   COPD Mother    Liver cancer Mother     ALLERGIES:  has No Known Allergies.  MEDICATIONS:  . Current Outpatient Medications on File Prior to Visit  Medication Sig Dispense Refill   acyclovir (ZOVIRAX) 400 MG tablet Take 1 tablet (400 mg total) by mouth daily. 90 tablet 3   Albuterol Sulfate (PROAIR RESPICLICK) 108 (90 Base) MCG/ACT AEPB Inhale into the lungs. (Patient not taking: Reported on 09/01/2022)     aspirin EC 81 MG tablet Take 1 tablet (81 mg total) by mouth daily. 60 tablet 11   azithromycin (ZITHROMAX) 250 MG tablet 2 tab (500mg ) for 1 days and then 1 tab (250mg ) daily for 4 days (Patient not taking: Reported on 11/24/2022) 6 each 0  cholecalciferol (VITAMIN D3) 10 MCG (400 UNIT) TABS tablet Take 1 tablet (400 Units total) by mouth at bedtime. (Patient not taking: Reported on 11/24/2022) 30 tablet 3   ferrous sulfate 325 (65 FE) MG EC tablet TAKE 1 TABLET BY MOUTH EVERY DAY WITH BREAKFAST 90 tablet 1   FLOVENT DISKUS 250 MCG/ACT AEPB Inhale into the lungs. (Patient not taking: Reported on 09/01/2022)     ipratropium-albuterol (DUONEB) 0.5-2.5 (3) MG/3ML SOLN Inhale into the lungs. (Patient not taking: Reported on 09/01/2022)     lenalidomide (REVLIMID) 10 MG capsule Take 1 capsule (10 mg total) by mouth daily. Take for 21 days on, 7 days off, repeat every 28 days. 21 capsule 0    lidocaine-prilocaine (EMLA) cream Apply 1 application topically as needed. Apply to Port-A-Cath at least 1 hr prior to treatment. 30 g 0   Multiple Vitamins-Minerals (MULTIVITAMIN WITH MINERALS) tablet Take 1 tablet by mouth at bedtime.     nicotine (NICODERM CQ - DOSED IN MG/24 HOURS) 14 mg/24hr patch PLACE 1 PATCH (14 MG TOTAL) ONTO THE SKIN DAILY. 28 patch 1   ondansetron (ZOFRAN) 4 MG tablet Take 2 tablets (8 mg total) by mouth every 8 (eight) hours as needed for nausea. (Patient not taking: Reported on 03/02/2023) 30 tablet 0   predniSONE (DELTASONE) 20 MG tablet Take 2 tablets (40 mg total) by mouth daily with breakfast. For the next four days (Patient not taking: Reported on 09/01/2022) 8 tablet 0   Vitamin D, Ergocalciferol, (DRISDOL) 1.25 MG (50000 UNIT) CAPS capsule Take 1 capsule (50,000 Units total) by mouth once a week. 12 capsule 6   Current Facility-Administered Medications on File Prior to Visit  Medication Dose Route Frequency Provider Last Rate Last Admin   acetaminophen (TYLENOL) 325 MG tablet            famotidine (PEPCID) 20 MG tablet            sodium chloride flush (NS) 0.9 % injection 10 mL  10 mL Intracatheter Once Johney Maine, MD        PHYSICAL EXAMINATION:  ECOG FS:1 - Symptomatic but completely ambulatory VSS Wt Readings from Last 3 Encounters:  04/13/23 158 lb 4 oz (71.8 kg)  03/30/23 158 lb (71.7 kg)  03/16/23 158 lb 8 oz (71.9 kg)    GENERAL:alert, in no acute distress and comfortable SKIN: no acute rashes, no significant lesions EYES: conjunctiva are pink and non-injected, sclera anicteric OROPHARYNX: MMM, no exudates, no oropharyngeal erythema or ulceration NECK: supple, no JVD LYMPH:  no palpable lymphadenopathy in the cervical, axillary or inguinal regions LUNGS: clear to auscultation b/l with normal respiratory effort HEART: regular rate & rhythm ABDOMEN:  normoactive bowel sounds , non tender, not distended. Extremity: no pedal  edema PSYCH: alert & oriented x 3 with fluent speech NEURO: no focal motor/sensory deficits   LABORATORY DATA:  I have reviewed the data as listed     Latest Ref Rng & Units 05/11/2023    8:11 AM 04/13/2023    8:30 AM 03/30/2023    8:17 AM  CBC  WBC 4.0 - 10.5 K/uL 8.3  6.9  6.7   Hemoglobin 13.0 - 17.0 g/dL 16.1  09.6  04.5   Hematocrit 39.0 - 52.0 % 35.1  39.1  38.8   Platelets 150 - 400 K/uL 247  244  178    CBC    Component Value Date/Time   WBC 8.3 05/11/2023 0811   WBC 10.1 08/23/2022 0835  RBC 3.72 (L) 05/11/2023 0811   HGB 12.0 (L) 05/11/2023 0811   HGB 8.3 (L) 12/14/2017 1230   HCT 35.1 (L) 05/11/2023 0811   HCT 23.9 (L) 12/14/2017 1230   PLT 247 05/11/2023 0811   PLT 524 (H) 12/14/2017 1230   MCV 94.4 05/11/2023 0811   MCV 88.4 12/14/2017 1230   MCH 32.3 05/11/2023 0811   MCHC 34.2 05/11/2023 0811   RDW 14.5 05/11/2023 0811   RDW 15.3 (H) 12/14/2017 1230   LYMPHSABS 1.9 05/11/2023 0811   LYMPHSABS 1.4 12/14/2017 1230   MONOABS 1.1 (H) 05/11/2023 0811   MONOABS 0.6 12/14/2017 1230   EOSABS 0.2 05/11/2023 0811   EOSABS 0.1 12/14/2017 1230   BASOSABS 0.1 05/11/2023 0811   BASOSABS 0.1 12/14/2017 1230        Latest Ref Rng & Units 05/11/2023    8:11 AM 04/13/2023    8:30 AM 03/30/2023    8:17 AM  CMP  Glucose 70 - 99 mg/dL 161  096  045   BUN 8 - 23 mg/dL 22  19  19    Creatinine 0.61 - 1.24 mg/dL 4.09  8.11  9.14   Sodium 135 - 145 mmol/L 139  141  142   Potassium 3.5 - 5.1 mmol/L 4.2  3.9  4.0   Chloride 98 - 111 mmol/L 107  109  111   CO2 22 - 32 mmol/L 23  25  23    Calcium 8.9 - 10.3 mg/dL 9.1  9.0  9.0   Total Protein 6.5 - 8.1 g/dL 7.2  6.9  6.7   Total Bilirubin 0.3 - 1.2 mg/dL 0.4  0.5  0.5   Alkaline Phos 38 - 126 U/L 75  52  52   AST 15 - 41 U/L 10  12  12    ALT 0 - 44 U/L 10  10  11       03/09/20 ([id number]) Bone Marrow Biopsy at [facility]   07/02/2019 Bone Marrow Biopsy ([id number]) at [facility]    08/07/18 BM  Bx:        RADIOGRAPHIC STUDIES: I have personally reviewed the radiological images as listed and agreed with the findings in the report. No results found.  ASSESSMENT & PLAN:    67 y.o. 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   Initial BM Bx showed >95% involvement with kappa restricted serum free light chains. 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 MoL Cy-   08/07/18 BM Bx revealed normocellular bone marrow with trilineage hematopoiesis and 1% plasma cells which indicates the pt is in remission 08/06/18 PET/CT revealed  Expansile lytic lesion involving the left iliac bone, unchanged. Lytic lesion involving the sternum, unchanged. While both are suspicious for myeloma, neither lesion is FDG avid. No hypermetabolic osseous lesions in the visualized axial and appendicular skeleton. Please note that the bilateral lower extremities were not imaged. No suspicious lymphadenopathy. Spleen is normal in size.    Pt began Ninlaro 3mg  maintenance after pt finished 13 cycles of CyBorD and BM bx showed only 1% plasma cells, then resumed CyBorD and held Ninlaro in anticipation of his tandem bone marrow transplants beginning in late December 2019 with Dr. Shara Blazing at [facility]  11/19/18 High dose chemotherapy with Melphalan 140mg /m2 and autologous stem cell rescue  03/20/19 High dose chemotherapy with Melphalan 200mg /m2 and autologous stem cell rescue. Non-infectious diarrhea, uncomplicated course otherwise.  07/02/2019 PET scan with results revealing "1. No FDG avid bone lesions are identified. 2.  Asymmetric uptake within the right, greater than left, palatine tonsils with mild fullness of the  right tonsillar soft tissue. ENT consult vs attention on follow up suggested. 3.  Innumerable mixed sclerotic and lytic lesions throughout the axial and appendicular skeleton without associated hypermetabolic activity. 4.  Ancillary CT findings as above."  07/02/2019 bone marrow biopsy ([id number])with results showing: "Trilineage hematopoieses with maturation. No plasma cell myeloma identified. Mild anemia and No Rouleaux or circulating plasma cells identified in the peripheral blood."  2) s/p Prophylactic IM nailing for rt femur  completed Physical Therapy at the [facility].  -patient previously reported improvement and is currently progressively back to full time work.  3) Thrombocytopenia resolved   4) Renal insufficiency --likely related to multiple myeloma.  Remained fairly stable with baseline creatinine of 1.5-2.1.  Creatinine is 1.44 today Plan -maintain follow up with nephrology referral to Dr Arrie [facility] for continued treatment -Pt completed 8 weeks of maverick for hep c with Dr Luciana Axe on 07/08/18.  -Kidney numbers improved. Creatinine in the 1.7-2.2 range   5) h/o tobacco abuse-counseled on smoking cessation -Currently not smoking.   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 Hemoglobin is stable at 12.8  PLAN:  -Discussed lab results on 05/11/2023 in detail with patient. CBC stable, showed WBC of 8.3K, hemoglobin of 12.0, and platelets of 247K. -CMP stable -Patient has no lab or clinical evidence of progression of his plasma cell leukemia/multiple myeloma  -continue maintenance Carfilzomib  -continue Aredia every 6 months for maintenance -04/19/2023 K/L light chain WNL, showed that patient continues to be in remission -Patient is continuing to recover from penile implant received at [facility]  FOLLOW-UP: Continue Aredia every 6 months Continue Maintenance Carfilzomib every 2  weeks with portflush and labs Next MD visit in 2 months  The total time spent in the appointment was 30 minutes* .  All of the patient's questions were answered with apparent satisfaction. The patient knows to call the clinic with any problems, questions or concerns.   Wyvonnia Lora MD MS AAHIVMS [facility] Hematology/Oncology Physician [facility]  .*Total Encounter Time as defined by the Centers for Medicare and Medicaid Services includes, in addition to the face-to-face time of a patient visit (documented in the note above) non-face-to-face time: obtaining and reviewing outside history, ordering and reviewing medications, tests or procedures, care coordination (communications with other health care professionals or caregivers) and documentation in the medical record.    I,Mitra Faeizi,acting as a Neurosurgeon for Wyvonnia Lora, MD.,have documented all relevant documentation on the behalf of Wyvonnia Lora, MD,as directed by  Wyvonnia Lora, MD while in the presence of Wyvonnia Lora, MD.  .I have reviewed the above documentation for accuracy and completeness, and I agree with the above. Johney Maine MD

## 2023-05-14 ENCOUNTER — Encounter: Payer: Self-pay | Admitting: Hematology

## 2023-05-16 ENCOUNTER — Other Ambulatory Visit: Payer: Self-pay

## 2023-05-17 ENCOUNTER — Encounter: Payer: Self-pay | Admitting: Hematology

## 2023-05-18 ENCOUNTER — Encounter: Payer: Self-pay | Admitting: Hematology

## 2023-05-21 LAB — MULTIPLE MYELOMA PANEL, SERUM
Albumin SerPl Elph-Mcnc: 3.2 g/dL (ref 2.9–4.4)
Albumin/Glob SerPl: 1 (ref 0.7–1.7)
Alpha 1: 0.4 g/dL (ref 0.0–0.4)
Alpha2 Glob SerPl Elph-Mcnc: 1.2 g/dL — ABNORMAL HIGH (ref 0.4–1.0)
B-Globulin SerPl Elph-Mcnc: 1 g/dL (ref 0.7–1.3)
Gamma Glob SerPl Elph-Mcnc: 0.7 g/dL (ref 0.4–1.8)
Globulin, Total: 3.3 g/dL (ref 2.2–3.9)
IgA: 111 mg/dL (ref 61–437)
IgG (Immunoglobin G), Serum: 788 mg/dL (ref 603–1613)
IgM (Immunoglobulin M), Srm: <5 mg/dL — ABNORMAL LOW (ref 20–172)
Total Protein ELP: 6.5 g/dL (ref 6.0–8.5)

## 2023-05-25 ENCOUNTER — Inpatient Hospital Stay: Payer: Medicare Other | Attending: Hematology

## 2023-05-25 ENCOUNTER — Other Ambulatory Visit: Payer: Self-pay

## 2023-05-25 ENCOUNTER — Inpatient Hospital Stay: Payer: Medicare Other

## 2023-05-25 VITALS — BP 134/85 | HR 72 | Temp 98.2°F | Resp 16 | Ht 68.0 in | Wt 157.2 lb

## 2023-05-25 DIAGNOSIS — C9010 Plasma cell leukemia not having achieved remission: Secondary | ICD-10-CM

## 2023-05-25 DIAGNOSIS — Z5111 Encounter for antineoplastic chemotherapy: Secondary | ICD-10-CM | POA: Diagnosis present

## 2023-05-25 DIAGNOSIS — Z7189 Other specified counseling: Secondary | ICD-10-CM

## 2023-05-25 DIAGNOSIS — M84551A Pathological fracture in neoplastic disease, right femur, initial encounter for fracture: Secondary | ICD-10-CM

## 2023-05-25 DIAGNOSIS — Z95828 Presence of other vascular implants and grafts: Secondary | ICD-10-CM

## 2023-05-25 DIAGNOSIS — C9011 Plasma cell leukemia in remission: Secondary | ICD-10-CM | POA: Diagnosis not present

## 2023-05-25 DIAGNOSIS — C9001 Multiple myeloma in remission: Secondary | ICD-10-CM | POA: Insufficient documentation

## 2023-05-25 DIAGNOSIS — C9000 Multiple myeloma not having achieved remission: Secondary | ICD-10-CM

## 2023-05-25 LAB — CMP (CANCER CENTER ONLY)
ALT: 10 U/L (ref 0–44)
AST: 12 U/L — ABNORMAL LOW (ref 15–41)
Albumin: 3.9 g/dL (ref 3.5–5.0)
Alkaline Phosphatase: 69 U/L (ref 38–126)
Anion gap: 7 (ref 5–15)
BUN: 19 mg/dL (ref 8–23)
CO2: 24 mmol/L (ref 22–32)
Calcium: 9.2 mg/dL (ref 8.9–10.3)
Chloride: 109 mmol/L (ref 98–111)
Creatinine: 1.4 mg/dL — ABNORMAL HIGH (ref 0.61–1.24)
GFR, Estimated: 56 mL/min — ABNORMAL LOW (ref >60)
Glucose, Bld: 105 mg/dL — ABNORMAL HIGH (ref 70–99)
Potassium: 4.2 mmol/L (ref 3.5–5.1)
Sodium: 140 mmol/L (ref 135–145)
Total Bilirubin: 0.6 mg/dL (ref 0.3–1.2)
Total Protein: 6.7 g/dL (ref 6.5–8.1)

## 2023-05-25 LAB — CBC WITH DIFFERENTIAL (CANCER CENTER ONLY)
Abs Immature Granulocytes: 0.04 10*3/uL (ref 0.00–0.07)
Basophils Absolute: 0.1 10*3/uL (ref 0.0–0.1)
Basophils Relative: 2 %
Eosinophils Absolute: 0.4 10*3/uL (ref 0.0–0.5)
Eosinophils Relative: 5 %
HCT: 37.3 % — ABNORMAL LOW (ref 39.0–52.0)
Hemoglobin: 12.7 g/dL — ABNORMAL LOW (ref 13.0–17.0)
Immature Granulocytes: 1 %
Lymphocytes Relative: 29 %
Lymphs Abs: 2.1 10*3/uL (ref 0.7–4.0)
MCH: 32.2 pg (ref 26.0–34.0)
MCHC: 34 g/dL (ref 30.0–36.0)
MCV: 94.7 fL (ref 80.0–100.0)
Monocytes Absolute: 0.8 10*3/uL (ref 0.1–1.0)
Monocytes Relative: 11 %
Neutro Abs: 3.8 10*3/uL (ref 1.7–7.7)
Neutrophils Relative %: 52 %
Platelet Count: 270 10*3/uL (ref 150–400)
RBC: 3.94 MIL/uL — ABNORMAL LOW (ref 4.22–5.81)
RDW: 15.7 % — ABNORMAL HIGH (ref 11.5–15.5)
WBC Count: 7.3 10*3/uL (ref 4.0–10.5)
nRBC: 0 % (ref 0.0–0.2)

## 2023-05-25 MED ORDER — SODIUM CHLORIDE 0.9% FLUSH
10.0000 mL | Freq: Once | INTRAVENOUS | Status: AC
  Administered 2023-05-25: 10 mL

## 2023-05-25 MED ORDER — HEPARIN SOD (PORK) LOCK FLUSH 100 UNIT/ML IV SOLN
500.0000 [IU] | Freq: Once | INTRAVENOUS | Status: AC | PRN
  Administered 2023-05-25: 500 [IU]

## 2023-05-25 MED ORDER — FAMOTIDINE IN NACL 20-0.9 MG/50ML-% IV SOLN
20.0000 mg | Freq: Once | INTRAVENOUS | Status: AC
  Administered 2023-05-25: 20 mg via INTRAVENOUS
  Filled 2023-05-25: qty 50

## 2023-05-25 MED ORDER — SODIUM CHLORIDE 0.9 % IV SOLN: Freq: Once | INTRAVENOUS | Status: AC

## 2023-05-25 MED ORDER — DEXAMETHASONE SODIUM PHOSPHATE 10 MG/ML IJ SOLN
6.0000 mg | Freq: Once | INTRAMUSCULAR | Status: AC
  Administered 2023-05-25: 6 mg via INTRAVENOUS
  Filled 2023-05-25: qty 1

## 2023-05-25 MED ORDER — DEXTROSE 5 % IV SOLN
56.0000 mg/m2 | Freq: Once | INTRAVENOUS | Status: AC
  Administered 2023-05-25: 100 mg via INTRAVENOUS
  Filled 2023-05-25: qty 30

## 2023-05-25 MED ORDER — ACETAMINOPHEN 325 MG PO TABS
650.0000 mg | ORAL_TABLET | Freq: Once | ORAL | Status: AC
  Administered 2023-05-25: 650 mg via ORAL
  Filled 2023-05-25: qty 2

## 2023-05-25 MED ORDER — SODIUM CHLORIDE 0.9% FLUSH
10.0000 mL | INTRAVENOUS | Status: DC | PRN
  Administered 2023-05-25: 10 mL

## 2023-05-25 NOTE — Patient Instructions (Signed)
[facility]  Discharge Instructions: Thank you for choosing [facility] to provide your oncology and hematology care.   If you have a lab appointment with the Cancer Center, please go directly to the Cancer Center and check in at the registration area.   Wear comfortable clothing and clothing appropriate for easy access to any Portacath or PICC line.   We strive to give you quality time with your provider. You may need to reschedule your appointment if you arrive late (15 or more minutes).  Arriving late affects you and other patients whose appointments are after yours.  Also, if you miss three or more appointments without notifying the office, you may be dismissed from the clinic at the provider's discretion.      For prescription refill requests, have your pharmacy contact our office and allow 72 hours for refills to be completed.    Today you received the following chemotherapy and/or immunotherapy agents :  Kyprolis   To help prevent nausea and vomiting after your treatment, we encourage you to take your nausea medication as directed.  BELOW ARE SYMPTOMS THAT SHOULD BE REPORTED IMMEDIATELY: *FEVER GREATER THAN 100.4 F (38 C) OR HIGHER *CHILLS OR SWEATING *NAUSEA AND VOMITING THAT IS NOT CONTROLLED WITH YOUR NAUSEA MEDICATION *UNUSUAL SHORTNESS OF BREATH *UNUSUAL BRUISING OR BLEEDING *URINARY PROBLEMS (pain or burning when urinating, or frequent urination) *BOWEL PROBLEMS (unusual diarrhea, constipation, pain near the anus) TENDERNESS IN MOUTH AND THROAT WITH OR WITHOUT PRESENCE OF ULCERS (sore throat, sores in mouth, or a toothache) UNUSUAL RASH, SWELLING OR PAIN  UNUSUAL VAGINAL DISCHARGE OR ITCHING   Items with * indicate a potential emergency and should be followed up as soon as possible or go to the Emergency Department if any problems should occur.  Please show the CHEMOTHERAPY ALERT CARD or IMMUNOTHERAPY ALERT CARD at  check-in to the Emergency Department and triage nurse.  Should you have questions after your visit or need to cancel or reschedule your appointment, please contact [facility]  Dept: [phone]  and follow the prompts.  Office hours are 8:00 a.m. to 4:30 p.m. Monday - Friday. Please note that voicemails left after 4:00 p.m. may not be returned until the following business day.  We are closed weekends and major holidays. You have access to a nurse at all times for urgent questions. Please call the main number to the clinic Dept: [phone] and follow the prompts.   For any non-urgent questions, you may also contact your provider using MyChart. We now offer e-Visits for anyone 18 and older to request care online for non-urgent symptoms. For details visit [email or web address].   Also download the MyChart app! Go to the app store, search "MyChart", open the app, select [facility], and log in with your MyChart username and password.   

## 2023-05-28 LAB — KAPPA/LAMBDA LIGHT CHAINS
Kappa free light chain: 21.5 mg/L — ABNORMAL HIGH (ref 3.3–19.4)
Kappa, lambda light chain ratio: 1.03 (ref 0.26–1.65)
Lambda free light chains: 20.8 mg/L (ref 5.7–26.3)

## 2023-05-30 LAB — MULTIPLE MYELOMA PANEL, SERUM
Albumin SerPl Elph-Mcnc: 3.5 g/dL (ref 2.9–4.4)
Albumin/Glob SerPl: 1.3 (ref 0.7–1.7)
Alpha 1: 0.2 g/dL (ref 0.0–0.4)
Alpha2 Glob SerPl Elph-Mcnc: 1.1 g/dL — ABNORMAL HIGH (ref 0.4–1.0)
B-Globulin SerPl Elph-Mcnc: 0.9 g/dL (ref 0.7–1.3)
Gamma Glob SerPl Elph-Mcnc: 0.6 g/dL (ref 0.4–1.8)
Globulin, Total: 2.9 g/dL (ref 2.2–3.9)
IgA: 97 mg/dL (ref 61–437)
IgG (Immunoglobin G), Serum: 773 mg/dL (ref 603–1613)
IgM (Immunoglobulin M), Srm: <5 mg/dL — ABNORMAL LOW (ref 20–172)
Total Protein ELP: 6.4 g/dL (ref 6.0–8.5)

## 2023-06-06 ENCOUNTER — Other Ambulatory Visit: Payer: Self-pay | Admitting: Hematology

## 2023-06-06 DIAGNOSIS — C9010 Plasma cell leukemia not having achieved remission: Secondary | ICD-10-CM

## 2023-06-08 ENCOUNTER — Ambulatory Visit: Payer: Medicare Other | Admitting: Hematology

## 2023-06-08 ENCOUNTER — Inpatient Hospital Stay: Payer: Medicare Other

## 2023-06-08 ENCOUNTER — Ambulatory Visit: Payer: Medicare Other

## 2023-06-08 ENCOUNTER — Other Ambulatory Visit: Payer: Medicare Other

## 2023-06-08 VITALS — BP 126/88 | HR 78 | Temp 97.7°F | Resp 18 | Wt 158.1 lb

## 2023-06-08 DIAGNOSIS — C9010 Plasma cell leukemia not having achieved remission: Secondary | ICD-10-CM

## 2023-06-08 DIAGNOSIS — Z7189 Other specified counseling: Secondary | ICD-10-CM

## 2023-06-08 DIAGNOSIS — Z5111 Encounter for antineoplastic chemotherapy: Secondary | ICD-10-CM | POA: Diagnosis not present

## 2023-06-08 DIAGNOSIS — C9000 Multiple myeloma not having achieved remission: Secondary | ICD-10-CM

## 2023-06-08 LAB — CMP (CANCER CENTER ONLY)
ALT: 10 U/L (ref 0–44)
AST: 11 U/L — ABNORMAL LOW (ref 15–41)
Albumin: 3.9 g/dL (ref 3.5–5.0)
Alkaline Phosphatase: 67 U/L (ref 38–126)
Anion gap: 8 (ref 5–15)
BUN: 26 mg/dL — ABNORMAL HIGH (ref 8–23)
CO2: 25 mmol/L (ref 22–32)
Calcium: 9.6 mg/dL (ref 8.9–10.3)
Chloride: 107 mmol/L (ref 98–111)
Creatinine: 1.51 mg/dL — ABNORMAL HIGH (ref 0.61–1.24)
GFR, Estimated: 51 mL/min — ABNORMAL LOW (ref >60)
Glucose, Bld: 69 mg/dL — ABNORMAL LOW (ref 70–99)
Potassium: 4.4 mmol/L (ref 3.5–5.1)
Sodium: 140 mmol/L (ref 135–145)
Total Bilirubin: 0.4 mg/dL (ref 0.3–1.2)
Total Protein: 7.1 g/dL (ref 6.5–8.1)

## 2023-06-08 LAB — CBC WITH DIFFERENTIAL (CANCER CENTER ONLY)
Abs Immature Granulocytes: 0.05 10*3/uL (ref 0.00–0.07)
Basophils Absolute: 0.1 10*3/uL (ref 0.0–0.1)
Basophils Relative: 1 %
Eosinophils Absolute: 0.4 10*3/uL (ref 0.0–0.5)
Eosinophils Relative: 5 %
HCT: 38.9 % — ABNORMAL LOW (ref 39.0–52.0)
Hemoglobin: 13.2 g/dL (ref 13.0–17.0)
Immature Granulocytes: 1 %
Lymphocytes Relative: 28 %
Lymphs Abs: 2.2 10*3/uL (ref 0.7–4.0)
MCH: 32.3 pg (ref 26.0–34.0)
MCHC: 33.9 g/dL (ref 30.0–36.0)
MCV: 95.1 fL (ref 80.0–100.0)
Monocytes Absolute: 0.7 10*3/uL (ref 0.1–1.0)
Monocytes Relative: 10 %
Neutro Abs: 4.3 10*3/uL (ref 1.7–7.7)
Neutrophils Relative %: 55 %
Platelet Count: 248 10*3/uL (ref 150–400)
RBC: 4.09 MIL/uL — ABNORMAL LOW (ref 4.22–5.81)
RDW: 14.8 % (ref 11.5–15.5)
WBC Count: 7.7 10*3/uL (ref 4.0–10.5)
nRBC: 0 % (ref 0.0–0.2)

## 2023-06-08 MED ORDER — HEPARIN SOD (PORK) LOCK FLUSH 100 UNIT/ML IV SOLN
500.0000 [IU] | Freq: Once | INTRAVENOUS | Status: AC | PRN
  Administered 2023-06-08: 500 [IU]

## 2023-06-08 MED ORDER — SODIUM CHLORIDE 0.9% FLUSH
10.0000 mL | INTRAVENOUS | Status: DC | PRN
  Administered 2023-06-08: 10 mL

## 2023-06-08 MED ORDER — DEXTROSE 5 % IV SOLN
56.0000 mg/m2 | Freq: Once | INTRAVENOUS | Status: AC
  Administered 2023-06-08: 100 mg via INTRAVENOUS
  Filled 2023-06-08: qty 30

## 2023-06-08 MED ORDER — FAMOTIDINE IN NACL 20-0.9 MG/50ML-% IV SOLN
20.0000 mg | Freq: Once | INTRAVENOUS | Status: AC
  Administered 2023-06-08: 20 mg via INTRAVENOUS
  Filled 2023-06-08: qty 50

## 2023-06-08 MED ORDER — DEXAMETHASONE SODIUM PHOSPHATE 10 MG/ML IJ SOLN
6.0000 mg | Freq: Once | INTRAMUSCULAR | Status: AC
  Administered 2023-06-08: 6 mg via INTRAVENOUS
  Filled 2023-06-08: qty 1

## 2023-06-08 MED ORDER — SODIUM CHLORIDE 0.9 % IV SOLN: Freq: Once | INTRAVENOUS | Status: AC

## 2023-06-08 MED ORDER — ACETAMINOPHEN 325 MG PO TABS
650.0000 mg | ORAL_TABLET | Freq: Once | ORAL | Status: AC
  Administered 2023-06-08: 650 mg via ORAL
  Filled 2023-06-08: qty 2

## 2023-06-08 NOTE — Patient Instructions (Signed)
[facility] [facility] [facility]  Discharge Instructions: Thank you for choosing [facility] to provide your oncology and hematology care.   If you have a lab appointment with the Cancer Center, please go directly to the Cancer Center and check in at the registration area.   Wear comfortable clothing and clothing appropriate for easy access to any Portacath or PICC line.   We strive to give you quality time with your provider. You may need to reschedule your appointment if you arrive late (15 or more minutes).  Arriving late affects you and other patients whose appointments are after yours.  Also, if you miss three or more appointments without notifying the office, you may be dismissed from the clinic at the provider's discretion.      For prescription refill requests, have your pharmacy contact our office and allow 72 hours for refills to be completed.    Today you received the following chemotherapy and/or immunotherapy agents :  Kyprolis   To help prevent nausea and vomiting after your treatment, we encourage you to take your nausea medication as directed.  BELOW ARE SYMPTOMS THAT SHOULD BE REPORTED IMMEDIATELY: *FEVER GREATER THAN 100.4 F (38 C) OR HIGHER *CHILLS OR SWEATING *NAUSEA AND VOMITING THAT IS NOT CONTROLLED WITH YOUR NAUSEA MEDICATION *UNUSUAL SHORTNESS OF BREATH *UNUSUAL BRUISING OR BLEEDING *URINARY PROBLEMS (pain or burning when urinating, or frequent urination) *BOWEL PROBLEMS (unusual diarrhea, constipation, pain near the anus) TENDERNESS IN MOUTH AND THROAT WITH OR WITHOUT PRESENCE OF ULCERS (sore throat, sores in mouth, or a toothache) UNUSUAL RASH, SWELLING OR PAIN  UNUSUAL VAGINAL DISCHARGE OR ITCHING   Items with * indicate a potential emergency and should be followed up as soon as possible or go to the Emergency Department if any problems should occur.  Please show the CHEMOTHERAPY ALERT CARD or IMMUNOTHERAPY ALERT CARD at  check-in to the Emergency Department and triage nurse.  Should you have questions after your visit or need to cancel or reschedule your appointment, please contact [facility]  Dept: [phone]  and follow the prompts.  Office hours are 8:00 a.m. to 4:30 p.m. Monday - Friday. Please note that voicemails left after 4:00 p.m. may not be returned until the following business day.  We are closed weekends and major holidays. You have access to a nurse at all times for urgent questions. Please call the main number to the clinic Dept: [phone] and follow the prompts.   For any non-urgent questions, you may also contact your provider using MyChart. We now offer e-Visits for anyone 18 and older to request care online for non-urgent symptoms. For details visit [email or web address].   Also download the MyChart app! Go to the app store, search "MyChart", open the app, select [facility], and log in with your MyChart username and password.   

## 2023-06-08 NOTE — Progress Notes (Signed)
Pt okay for tx today with CR: 1.51

## 2023-06-08 NOTE — Progress Notes (Signed)
Per Candise Che MD, ok to treat with SCR 1.51 today.

## 2023-06-20 ENCOUNTER — Other Ambulatory Visit: Payer: Self-pay

## 2023-06-20 DIAGNOSIS — C9000 Multiple myeloma not having achieved remission: Secondary | ICD-10-CM

## 2023-06-21 ENCOUNTER — Other Ambulatory Visit: Payer: Self-pay | Admitting: Hematology

## 2023-06-21 DIAGNOSIS — C9010 Plasma cell leukemia not having achieved remission: Secondary | ICD-10-CM

## 2023-06-21 DIAGNOSIS — Z7189 Other specified counseling: Secondary | ICD-10-CM

## 2023-06-22 ENCOUNTER — Other Ambulatory Visit: Payer: Self-pay

## 2023-06-22 ENCOUNTER — Inpatient Hospital Stay: Payer: Medicare Other

## 2023-06-22 ENCOUNTER — Inpatient Hospital Stay: Payer: Medicare Other | Attending: Hematology

## 2023-06-22 VITALS — BP 131/81 | HR 75 | Temp 98.0°F | Resp 17 | Wt 161.0 lb

## 2023-06-22 DIAGNOSIS — Z95828 Presence of other vascular implants and grafts: Secondary | ICD-10-CM

## 2023-06-22 DIAGNOSIS — C9011 Plasma cell leukemia in remission: Secondary | ICD-10-CM | POA: Diagnosis not present

## 2023-06-22 DIAGNOSIS — Z5111 Encounter for antineoplastic chemotherapy: Secondary | ICD-10-CM | POA: Diagnosis present

## 2023-06-22 DIAGNOSIS — C9010 Plasma cell leukemia not having achieved remission: Secondary | ICD-10-CM

## 2023-06-22 DIAGNOSIS — M84551A Pathological fracture in neoplastic disease, right femur, initial encounter for fracture: Secondary | ICD-10-CM

## 2023-06-22 DIAGNOSIS — C9001 Multiple myeloma in remission: Secondary | ICD-10-CM | POA: Diagnosis not present

## 2023-06-22 DIAGNOSIS — Z7189 Other specified counseling: Secondary | ICD-10-CM

## 2023-06-22 LAB — CBC WITH DIFFERENTIAL (CANCER CENTER ONLY)
Abs Immature Granulocytes: 0.04 10*3/uL (ref 0.00–0.07)
Basophils Absolute: 0.1 10*3/uL (ref 0.0–0.1)
Basophils Relative: 1 %
Eosinophils Absolute: 0.5 10*3/uL (ref 0.0–0.5)
Eosinophils Relative: 6 %
HCT: 38.8 % — ABNORMAL LOW (ref 39.0–52.0)
Hemoglobin: 13.3 g/dL (ref 13.0–17.0)
Immature Granulocytes: 1 %
Lymphocytes Relative: 25 %
Lymphs Abs: 2 10*3/uL (ref 0.7–4.0)
MCH: 32 pg (ref 26.0–34.0)
MCHC: 34.3 g/dL (ref 30.0–36.0)
MCV: 93.3 fL (ref 80.0–100.0)
Monocytes Absolute: 1 10*3/uL (ref 0.1–1.0)
Monocytes Relative: 13 %
Neutro Abs: 4.4 10*3/uL (ref 1.7–7.7)
Neutrophils Relative %: 54 %
Platelet Count: 256 10*3/uL (ref 150–400)
RBC: 4.16 MIL/uL — ABNORMAL LOW (ref 4.22–5.81)
RDW: 14.6 % (ref 11.5–15.5)
WBC Count: 8 10*3/uL (ref 4.0–10.5)
nRBC: 0 % (ref 0.0–0.2)

## 2023-06-22 LAB — CMP (CANCER CENTER ONLY)
ALT: 10 U/L (ref 0–44)
AST: 11 U/L — ABNORMAL LOW (ref 15–41)
Albumin: 4.1 g/dL (ref 3.5–5.0)
Alkaline Phosphatase: 61 U/L (ref 38–126)
Anion gap: 8 (ref 5–15)
BUN: 24 mg/dL — ABNORMAL HIGH (ref 8–23)
CO2: 23 mmol/L (ref 22–32)
Calcium: 9.2 mg/dL (ref 8.9–10.3)
Chloride: 110 mmol/L (ref 98–111)
Creatinine: 1.56 mg/dL — ABNORMAL HIGH (ref 0.61–1.24)
GFR, Estimated: 49 mL/min — ABNORMAL LOW (ref >60)
Glucose, Bld: 70 mg/dL (ref 70–99)
Potassium: 3.9 mmol/L (ref 3.5–5.1)
Sodium: 141 mmol/L (ref 135–145)
Total Bilirubin: 0.3 mg/dL (ref 0.3–1.2)
Total Protein: 6.8 g/dL (ref 6.5–8.1)

## 2023-06-22 MED ORDER — SODIUM CHLORIDE 0.9% FLUSH
10.0000 mL | Freq: Once | INTRAVENOUS | Status: AC
  Administered 2023-06-22: 10 mL

## 2023-06-22 MED ORDER — SODIUM CHLORIDE 0.9 % IV SOLN: Freq: Once | INTRAVENOUS | Status: AC

## 2023-06-22 MED ORDER — DEXAMETHASONE SODIUM PHOSPHATE 10 MG/ML IJ SOLN
6.0000 mg | Freq: Once | INTRAMUSCULAR | Status: AC
  Administered 2023-06-22: 6 mg via INTRAVENOUS
  Filled 2023-06-22: qty 1

## 2023-06-22 MED ORDER — DEXTROSE 5 % IV SOLN
56.0000 mg/m2 | Freq: Once | INTRAVENOUS | Status: AC
  Administered 2023-06-22: 100 mg via INTRAVENOUS
  Filled 2023-06-22: qty 30

## 2023-06-22 MED ORDER — FAMOTIDINE IN NACL 20-0.9 MG/50ML-% IV SOLN
20.0000 mg | Freq: Once | INTRAVENOUS | Status: AC
  Administered 2023-06-22: 20 mg via INTRAVENOUS
  Filled 2023-06-22: qty 50

## 2023-06-22 MED ORDER — SODIUM CHLORIDE 0.9% FLUSH
10.0000 mL | INTRAVENOUS | Status: DC | PRN
  Administered 2023-06-22: 10 mL

## 2023-06-22 MED ORDER — ACETAMINOPHEN 325 MG PO TABS
650.0000 mg | ORAL_TABLET | Freq: Once | ORAL | Status: AC
  Administered 2023-06-22: 650 mg via ORAL
  Filled 2023-06-22: qty 2

## 2023-06-22 MED ORDER — HEPARIN SOD (PORK) LOCK FLUSH 100 UNIT/ML IV SOLN
500.0000 [IU] | Freq: Once | INTRAVENOUS | Status: AC | PRN
  Administered 2023-06-22: 500 [IU]

## 2023-06-22 MED ORDER — SODIUM CHLORIDE 0.9 % IV SOLN: Freq: Once | INTRAVENOUS | Status: DC

## 2023-06-22 NOTE — Patient Instructions (Signed)
[facility] [place] [facility]  Discharge Instructions: Thank you for choosing [facility] to provide your oncology and hematology care.   If you have a lab appointment with the Cancer Center, please go directly to the Cancer Center and check in at the registration area.   Wear comfortable clothing and clothing appropriate for easy access to any Portacath or PICC line.   We strive to give you quality time with your provider. You may need to reschedule your appointment if you arrive late (15 or more minutes).  Arriving late affects you and other patients whose appointments are after yours.  Also, if you miss three or more appointments without notifying the office, you may be dismissed from the clinic at the provider's discretion.      For prescription refill requests, have your pharmacy contact our office and allow 72 hours for refills to be completed.    Today you received the following chemotherapy and/or immunotherapy agents :  Kyprolis   To help prevent nausea and vomiting after your treatment, we encourage you to take your nausea medication as directed.  BELOW ARE SYMPTOMS THAT SHOULD BE REPORTED IMMEDIATELY: *FEVER GREATER THAN 100.4 F (38 C) OR HIGHER *CHILLS OR SWEATING *NAUSEA AND VOMITING THAT IS NOT CONTROLLED WITH YOUR NAUSEA MEDICATION *UNUSUAL SHORTNESS OF BREATH *UNUSUAL BRUISING OR BLEEDING *URINARY PROBLEMS (pain or burning when urinating, or frequent urination) *BOWEL PROBLEMS (unusual diarrhea, constipation, pain near the anus) TENDERNESS IN MOUTH AND THROAT WITH OR WITHOUT PRESENCE OF ULCERS (sore throat, sores in mouth, or a toothache) UNUSUAL RASH, SWELLING OR PAIN  UNUSUAL VAGINAL DISCHARGE OR ITCHING   Items with * indicate a potential emergency and should be followed up as soon as possible or go to the Emergency Department if any problems should occur.  Please show the CHEMOTHERAPY ALERT CARD or IMMUNOTHERAPY ALERT CARD at  check-in to the Emergency Department and triage nurse.  Should you have questions after your visit or need to cancel or reschedule your appointment, please contact [facility]  Dept: [phone]  and follow the prompts.  Office hours are 8:00 a.m. to 4:30 p.m. Monday - Friday. Please note that voicemails left after 4:00 p.m. may not be returned until the following business day.  We are closed weekends and major holidays. You have access to a nurse at all times for urgent questions. Please call the main number to the clinic Dept: [phone] and follow the prompts.   For any non-urgent questions, you may also contact your provider using MyChart. We now offer e-Visits for anyone 18 and older to request care online for non-urgent symptoms. For details visit [email or web address].   Also download the MyChart app! Go to the app store, search "MyChart", open the app, select , and log in with your MyChart username and password.   

## 2023-06-22 NOTE — Progress Notes (Signed)
Pt okay for tx today with CR: 1.56 per Dr Candise Che

## 2023-06-22 NOTE — Progress Notes (Signed)
Ok to tx with scr= 1.56 per Dr Candise Che

## 2023-06-25 LAB — KAPPA/LAMBDA LIGHT CHAINS
Kappa free light chain: 16.3 mg/L (ref 3.3–19.4)
Kappa, lambda light chain ratio: 1.12 (ref 0.26–1.65)
Lambda free light chains: 14.6 mg/L (ref 5.7–26.3)

## 2023-06-28 ENCOUNTER — Other Ambulatory Visit: Payer: Self-pay

## 2023-07-06 ENCOUNTER — Other Ambulatory Visit: Payer: Self-pay

## 2023-07-06 ENCOUNTER — Inpatient Hospital Stay: Payer: Medicare Other

## 2023-07-06 ENCOUNTER — Other Ambulatory Visit: Payer: Self-pay | Admitting: *Deleted

## 2023-07-06 VITALS — BP 144/96 | HR 76 | Temp 98.3°F | Resp 16 | Wt 160.5 lb

## 2023-07-06 DIAGNOSIS — Z5111 Encounter for antineoplastic chemotherapy: Secondary | ICD-10-CM | POA: Diagnosis not present

## 2023-07-06 DIAGNOSIS — C9000 Multiple myeloma not having achieved remission: Secondary | ICD-10-CM

## 2023-07-06 DIAGNOSIS — Z7189 Other specified counseling: Secondary | ICD-10-CM

## 2023-07-06 DIAGNOSIS — M84551A Pathological fracture in neoplastic disease, right femur, initial encounter for fracture: Secondary | ICD-10-CM

## 2023-07-06 DIAGNOSIS — Z95828 Presence of other vascular implants and grafts: Secondary | ICD-10-CM

## 2023-07-06 DIAGNOSIS — C9010 Plasma cell leukemia not having achieved remission: Secondary | ICD-10-CM

## 2023-07-06 LAB — CBC WITH DIFFERENTIAL (CANCER CENTER ONLY)
Abs Immature Granulocytes: 0.04 10*3/uL (ref 0.00–0.07)
Basophils Absolute: 0.1 10*3/uL (ref 0.0–0.1)
Basophils Relative: 1 %
Eosinophils Absolute: 0.2 10*3/uL (ref 0.0–0.5)
Eosinophils Relative: 2 %
HCT: 39.7 % (ref 39.0–52.0)
Hemoglobin: 13.8 g/dL (ref 13.0–17.0)
Immature Granulocytes: 1 %
Lymphocytes Relative: 26 %
Lymphs Abs: 2.1 10*3/uL (ref 0.7–4.0)
MCH: 31.8 pg (ref 26.0–34.0)
MCHC: 34.8 g/dL (ref 30.0–36.0)
MCV: 91.5 fL (ref 80.0–100.0)
Monocytes Absolute: 1 10*3/uL (ref 0.1–1.0)
Monocytes Relative: 12 %
Neutro Abs: 4.7 10*3/uL (ref 1.7–7.7)
Neutrophils Relative %: 58 %
Platelet Count: 258 10*3/uL (ref 150–400)
RBC: 4.34 MIL/uL (ref 4.22–5.81)
RDW: 14.6 % (ref 11.5–15.5)
WBC Count: 8 10*3/uL (ref 4.0–10.5)
nRBC: 0 % (ref 0.0–0.2)

## 2023-07-06 LAB — CMP (CANCER CENTER ONLY)
ALT: 11 U/L (ref 0–44)
AST: 10 U/L — ABNORMAL LOW (ref 15–41)
Albumin: 4.3 g/dL (ref 3.5–5.0)
Alkaline Phosphatase: 71 U/L (ref 38–126)
Anion gap: 7 (ref 5–15)
BUN: 22 mg/dL (ref 8–23)
CO2: 26 mmol/L (ref 22–32)
Calcium: 9.5 mg/dL (ref 8.9–10.3)
Chloride: 108 mmol/L (ref 98–111)
Creatinine: 1.65 mg/dL — ABNORMAL HIGH (ref 0.61–1.24)
GFR, Estimated: 46 mL/min — ABNORMAL LOW (ref >60)
Glucose, Bld: 93 mg/dL (ref 70–99)
Potassium: 4.7 mmol/L (ref 3.5–5.1)
Sodium: 141 mmol/L (ref 135–145)
Total Bilirubin: 0.5 mg/dL (ref 0.3–1.2)
Total Protein: 6.9 g/dL (ref 6.5–8.1)

## 2023-07-06 MED ORDER — ACETAMINOPHEN 325 MG PO TABS
650.0000 mg | ORAL_TABLET | Freq: Once | ORAL | Status: AC
  Administered 2023-07-06: 650 mg via ORAL
  Filled 2023-07-06: qty 2

## 2023-07-06 MED ORDER — FAMOTIDINE IN NACL 20-0.9 MG/50ML-% IV SOLN
20.0000 mg | Freq: Once | INTRAVENOUS | Status: AC
  Administered 2023-07-06: 20 mg via INTRAVENOUS
  Filled 2023-07-06: qty 50

## 2023-07-06 MED ORDER — HEPARIN SOD (PORK) LOCK FLUSH 100 UNIT/ML IV SOLN: 500.0000 [IU] | Freq: Once | INTRAVENOUS | Status: DC | PRN

## 2023-07-06 MED ORDER — SODIUM CHLORIDE 0.9 % IV SOLN: Freq: Once | INTRAVENOUS | Status: AC

## 2023-07-06 MED ORDER — DEXAMETHASONE SODIUM PHOSPHATE 10 MG/ML IJ SOLN
6.0000 mg | Freq: Once | INTRAMUSCULAR | Status: AC
  Administered 2023-07-06: 6 mg via INTRAVENOUS
  Filled 2023-07-06: qty 1

## 2023-07-06 MED ORDER — SODIUM CHLORIDE 0.9% FLUSH
10.0000 mL | Freq: Once | INTRAVENOUS | Status: AC
  Administered 2023-07-06: 10 mL

## 2023-07-06 MED ORDER — DEXTROSE 5 % IV SOLN
56.0000 mg/m2 | Freq: Once | INTRAVENOUS | Status: AC
  Administered 2023-07-06: 100 mg via INTRAVENOUS
  Filled 2023-07-06: qty 30

## 2023-07-06 MED ORDER — HEPARIN SOD (PORK) LOCK FLUSH 100 UNIT/ML IV SOLN: 250.0000 [IU] | Freq: Once | INTRAVENOUS | Status: DC | PRN

## 2023-07-06 MED ORDER — SODIUM CHLORIDE 0.9% FLUSH: 3.0000 mL | Freq: Once | INTRAVENOUS | Status: DC | PRN

## 2023-07-06 MED ORDER — SODIUM CHLORIDE 0.9% FLUSH
10.0000 mL | INTRAVENOUS | Status: DC | PRN
  Administered 2023-07-06: 10 mL

## 2023-07-06 MED ORDER — ALTEPLASE 2 MG IJ SOLR: 2.0000 mg | Freq: Once | INTRAMUSCULAR | Status: DC | PRN

## 2023-07-06 MED ORDER — HEPARIN SOD (PORK) LOCK FLUSH 100 UNIT/ML IV SOLN
500.0000 [IU] | Freq: Once | INTRAVENOUS | Status: AC | PRN
  Administered 2023-07-06: 500 [IU]

## 2023-07-06 NOTE — Progress Notes (Signed)
Per Earlie Counts OK to proceed with tx w/ elevated SCR 1.65 mg/dL

## 2023-07-06 NOTE — Patient Instructions (Signed)
[place] [facility]  Discharge Instructions: Thank you for choosing [facility] to provide your oncology and hematology care.   If you have a lab appointment with the Cancer Center, please go directly to the Cancer Center and check in at the registration area.   Wear comfortable clothing and clothing appropriate for easy access to any Portacath or PICC line.   We strive to give you quality time with your provider. You may need to reschedule your appointment if you arrive late (15 or more minutes).  Arriving late affects you and other patients whose appointments are after yours.  Also, if you miss three or more appointments without notifying the office, you may be dismissed from the clinic at the provider's discretion.      For prescription refill requests, have your pharmacy contact our office and allow 72 hours for refills to be completed.    Today you received the following chemotherapy and/or immunotherapy agents :  Kyprolis   To help prevent nausea and vomiting after your treatment, we encourage you to take your nausea medication as directed.  BELOW ARE SYMPTOMS THAT SHOULD BE REPORTED IMMEDIATELY: *FEVER GREATER THAN 100.4 F (38 C) OR HIGHER *CHILLS OR SWEATING *NAUSEA AND VOMITING THAT IS NOT CONTROLLED WITH YOUR NAUSEA MEDICATION *UNUSUAL SHORTNESS OF BREATH *UNUSUAL BRUISING OR BLEEDING *URINARY PROBLEMS (pain or burning when urinating, or frequent urination) *BOWEL PROBLEMS (unusual diarrhea, constipation, pain near the anus) TENDERNESS IN MOUTH AND THROAT WITH OR WITHOUT PRESENCE OF ULCERS (sore throat, sores in mouth, or a toothache) UNUSUAL RASH, SWELLING OR PAIN  UNUSUAL VAGINAL DISCHARGE OR ITCHING   Items with * indicate a potential emergency and should be followed up as soon as possible or go to the Emergency Department if any problems should occur.  Please show the CHEMOTHERAPY ALERT CARD or IMMUNOTHERAPY ALERT CARD at  check-in to the Emergency Department and triage nurse.  Should you have questions after your visit or need to cancel or reschedule your appointment, please contact [place] [facility]  Dept: [phone]  and follow the prompts.  Office hours are 8:00 a.m. to 4:30 p.m. Monday - Friday. Please note that voicemails left after 4:00 p.m. may not be returned until the following business day.  We are closed weekends and major holidays. You have access to a nurse at all times for urgent questions. Please call the main number to the clinic Dept: [phone] and follow the prompts.   For any non-urgent questions, you may also contact your provider using MyChart. We now offer e-Visits for anyone 18 and older to request care online for non-urgent symptoms. For details visit [email or web address].   Also download the MyChart app! Go to the app store, search "MyChart", open the app, select [facility], and log in with your MyChart username and password.   

## 2023-07-10 ENCOUNTER — Other Ambulatory Visit: Payer: Self-pay

## 2023-07-10 DIAGNOSIS — C9010 Plasma cell leukemia not having achieved remission: Secondary | ICD-10-CM

## 2023-07-10 MED ORDER — LENALIDOMIDE 10 MG PO CAPS
10.0000 mg | ORAL_CAPSULE | Freq: Every day | ORAL | 0 refills | Status: DC
Start: 2023-07-10 — End: 2023-08-03

## 2023-07-20 ENCOUNTER — Other Ambulatory Visit: Payer: Self-pay

## 2023-07-20 ENCOUNTER — Inpatient Hospital Stay: Payer: Medicare Other

## 2023-07-20 ENCOUNTER — Inpatient Hospital Stay: Payer: Medicare Other | Attending: Hematology

## 2023-07-20 VITALS — BP 147/86 | HR 74 | Temp 97.8°F | Resp 18 | Wt 162.8 lb

## 2023-07-20 DIAGNOSIS — C9001 Multiple myeloma in remission: Secondary | ICD-10-CM | POA: Diagnosis not present

## 2023-07-20 DIAGNOSIS — C9010 Plasma cell leukemia not having achieved remission: Secondary | ICD-10-CM

## 2023-07-20 DIAGNOSIS — C9011 Plasma cell leukemia in remission: Secondary | ICD-10-CM | POA: Diagnosis not present

## 2023-07-20 DIAGNOSIS — Z7189 Other specified counseling: Secondary | ICD-10-CM

## 2023-07-20 DIAGNOSIS — M84551A Pathological fracture in neoplastic disease, right femur, initial encounter for fracture: Secondary | ICD-10-CM

## 2023-07-20 DIAGNOSIS — Z95828 Presence of other vascular implants and grafts: Secondary | ICD-10-CM

## 2023-07-20 DIAGNOSIS — Z5111 Encounter for antineoplastic chemotherapy: Secondary | ICD-10-CM | POA: Insufficient documentation

## 2023-07-20 LAB — CBC WITH DIFFERENTIAL (CANCER CENTER ONLY)
Abs Immature Granulocytes: 0.04 10*3/uL (ref 0.00–0.07)
Basophils Absolute: 0.1 10*3/uL (ref 0.0–0.1)
Basophils Relative: 2 %
Eosinophils Absolute: 0.2 10*3/uL (ref 0.0–0.5)
Eosinophils Relative: 3 %
HCT: 40 % (ref 39.0–52.0)
Hemoglobin: 14.3 g/dL (ref 13.0–17.0)
Immature Granulocytes: 1 %
Lymphocytes Relative: 28 %
Lymphs Abs: 2.3 10*3/uL (ref 0.7–4.0)
MCH: 32.4 pg (ref 26.0–34.0)
MCHC: 35.8 g/dL (ref 30.0–36.0)
MCV: 90.5 fL (ref 80.0–100.0)
Monocytes Absolute: 0.9 10*3/uL (ref 0.1–1.0)
Monocytes Relative: 11 %
Neutro Abs: 4.6 10*3/uL (ref 1.7–7.7)
Neutrophils Relative %: 55 %
Platelet Count: 264 10*3/uL (ref 150–400)
RBC: 4.42 MIL/uL (ref 4.22–5.81)
RDW: 14.5 % (ref 11.5–15.5)
WBC Count: 8.2 10*3/uL (ref 4.0–10.5)
nRBC: 0 % (ref 0.0–0.2)

## 2023-07-20 LAB — CMP (CANCER CENTER ONLY)
ALT: 9 U/L (ref 0–44)
AST: 10 U/L — ABNORMAL LOW (ref 15–41)
Albumin: 4.2 g/dL (ref 3.5–5.0)
Alkaline Phosphatase: 65 U/L (ref 38–126)
Anion gap: 6 (ref 5–15)
BUN: 22 mg/dL (ref 8–23)
CO2: 25 mmol/L (ref 22–32)
Calcium: 9.1 mg/dL (ref 8.9–10.3)
Chloride: 108 mmol/L (ref 98–111)
Creatinine: 1.62 mg/dL — ABNORMAL HIGH (ref 0.61–1.24)
GFR, Estimated: 47 mL/min — ABNORMAL LOW (ref >60)
Glucose, Bld: 96 mg/dL (ref 70–99)
Potassium: 4.4 mmol/L (ref 3.5–5.1)
Sodium: 139 mmol/L (ref 135–145)
Total Bilirubin: 0.5 mg/dL (ref 0.3–1.2)
Total Protein: 7 g/dL (ref 6.5–8.1)

## 2023-07-20 MED ORDER — SODIUM CHLORIDE 0.9% FLUSH
10.0000 mL | INTRAVENOUS | Status: DC | PRN
  Administered 2023-07-20: 10 mL

## 2023-07-20 MED ORDER — HEPARIN SOD (PORK) LOCK FLUSH 100 UNIT/ML IV SOLN
500.0000 [IU] | Freq: Once | INTRAVENOUS | Status: AC | PRN
  Administered 2023-07-20: 500 [IU]

## 2023-07-20 MED ORDER — DEXTROSE 5 % IV SOLN
56.0000 mg/m2 | Freq: Once | INTRAVENOUS | Status: AC
  Administered 2023-07-20: 100 mg via INTRAVENOUS
  Filled 2023-07-20: qty 30

## 2023-07-20 MED ORDER — SODIUM CHLORIDE 0.9 % IV SOLN: Freq: Once | INTRAVENOUS | Status: AC

## 2023-07-20 MED ORDER — ACETAMINOPHEN 325 MG PO TABS
650.0000 mg | ORAL_TABLET | Freq: Once | ORAL | Status: AC
  Administered 2023-07-20: 650 mg via ORAL
  Filled 2023-07-20: qty 2

## 2023-07-20 MED ORDER — FAMOTIDINE IN NACL 20-0.9 MG/50ML-% IV SOLN
20.0000 mg | Freq: Once | INTRAVENOUS | Status: AC
  Administered 2023-07-20: 20 mg via INTRAVENOUS
  Filled 2023-07-20: qty 50

## 2023-07-20 MED ORDER — DEXAMETHASONE SODIUM PHOSPHATE 10 MG/ML IJ SOLN
6.0000 mg | Freq: Once | INTRAMUSCULAR | Status: AC
  Administered 2023-07-20: 6 mg via INTRAVENOUS
  Filled 2023-07-20: qty 1

## 2023-07-20 MED ORDER — SODIUM CHLORIDE 0.9% FLUSH
10.0000 mL | Freq: Once | INTRAVENOUS | Status: AC
  Administered 2023-07-20: 10 mL

## 2023-07-20 NOTE — Progress Notes (Signed)
Per Dr Candise Che, ok to tx with Scr 1.62.

## 2023-07-20 NOTE — Patient Instructions (Signed)
[place] [facility]  Discharge Instructions: Thank you for choosing [facility] to provide your oncology and hematology care.   If you have a lab appointment with the Cancer Center, please go directly to the Cancer Center and check in at the registration area.   Wear comfortable clothing and clothing appropriate for easy access to any Portacath or PICC line.   We strive to give you quality time with your provider. You may need to reschedule your appointment if you arrive late (15 or more minutes).  Arriving late affects you and other patients whose appointments are after yours.  Also, if you miss three or more appointments without notifying the office, you may be dismissed from the clinic at the provider's discretion.      For prescription refill requests, have your pharmacy contact our office and allow 72 hours for refills to be completed.    Today you received the following chemotherapy and/or immunotherapy agents :  Kyprolis   To help prevent nausea and vomiting after your treatment, we encourage you to take your nausea medication as directed.  BELOW ARE SYMPTOMS THAT SHOULD BE REPORTED IMMEDIATELY: *FEVER GREATER THAN 100.4 F (38 C) OR HIGHER *CHILLS OR SWEATING *NAUSEA AND VOMITING THAT IS NOT CONTROLLED WITH YOUR NAUSEA MEDICATION *UNUSUAL SHORTNESS OF BREATH *UNUSUAL BRUISING OR BLEEDING *URINARY PROBLEMS (pain or burning when urinating, or frequent urination) *BOWEL PROBLEMS (unusual diarrhea, constipation, pain near the anus) TENDERNESS IN MOUTH AND THROAT WITH OR WITHOUT PRESENCE OF ULCERS (sore throat, sores in mouth, or a toothache) UNUSUAL RASH, SWELLING OR PAIN  UNUSUAL VAGINAL DISCHARGE OR ITCHING   Items with * indicate a potential emergency and should be followed up as soon as possible or go to the Emergency Department if any problems should occur.  Please show the CHEMOTHERAPY ALERT CARD or IMMUNOTHERAPY ALERT CARD at  check-in to the Emergency Department and triage nurse.  Should you have questions after your visit or need to cancel or reschedule your appointment, please contact [place] [facility]  Dept: [phone]  and follow the prompts.  Office hours are 8:00 a.m. to 4:30 p.m. Monday - Friday. Please note that voicemails left after 4:00 p.m. may not be returned until the following business day.  We are closed weekends and major holidays. You have access to a nurse at all times for urgent questions. Please call the main number to the clinic Dept: [phone] and follow the prompts.   For any non-urgent questions, you may also contact your provider using MyChart. We now offer e-Visits for anyone 18 and older to request care online for non-urgent symptoms. For details visit [email or web address].   Also download the MyChart app! Go to the app store, search "MyChart", open the app, select [facility], and log in with your MyChart username and password.   

## 2023-07-31 ENCOUNTER — Other Ambulatory Visit: Payer: Self-pay

## 2023-08-03 ENCOUNTER — Inpatient Hospital Stay: Payer: Medicare Other | Admitting: Hematology

## 2023-08-03 ENCOUNTER — Inpatient Hospital Stay: Payer: Medicare Other

## 2023-08-03 ENCOUNTER — Other Ambulatory Visit: Payer: Self-pay

## 2023-08-03 VITALS — BP 144/93 | HR 74 | Temp 98.0°F | Resp 14 | Wt 162.2 lb

## 2023-08-03 DIAGNOSIS — C9010 Plasma cell leukemia not having achieved remission: Secondary | ICD-10-CM | POA: Diagnosis not present

## 2023-08-03 DIAGNOSIS — C9000 Multiple myeloma not having achieved remission: Secondary | ICD-10-CM

## 2023-08-03 DIAGNOSIS — Z5111 Encounter for antineoplastic chemotherapy: Secondary | ICD-10-CM

## 2023-08-03 DIAGNOSIS — Z95828 Presence of other vascular implants and grafts: Secondary | ICD-10-CM

## 2023-08-03 DIAGNOSIS — M84551A Pathological fracture in neoplastic disease, right femur, initial encounter for fracture: Secondary | ICD-10-CM

## 2023-08-03 DIAGNOSIS — Z7189 Other specified counseling: Secondary | ICD-10-CM | POA: Diagnosis not present

## 2023-08-03 LAB — CMP (CANCER CENTER ONLY)
ALT: 10 U/L (ref 0–44)
AST: 11 U/L — ABNORMAL LOW (ref 15–41)
Albumin: 4.1 g/dL (ref 3.5–5.0)
Alkaline Phosphatase: 62 U/L (ref 38–126)
Anion gap: 6 (ref 5–15)
BUN: 19 mg/dL (ref 8–23)
CO2: 24 mmol/L (ref 22–32)
Calcium: 9 mg/dL (ref 8.9–10.3)
Chloride: 112 mmol/L — ABNORMAL HIGH (ref 98–111)
Creatinine: 1.46 mg/dL — ABNORMAL HIGH (ref 0.61–1.24)
GFR, Estimated: 53 mL/min — ABNORMAL LOW (ref >60)
Glucose, Bld: 119 mg/dL — ABNORMAL HIGH (ref 70–99)
Potassium: 3.8 mmol/L (ref 3.5–5.1)
Sodium: 142 mmol/L (ref 135–145)
Total Bilirubin: 0.5 mg/dL (ref 0.3–1.2)
Total Protein: 6.7 g/dL (ref 6.5–8.1)

## 2023-08-03 LAB — CBC WITH DIFFERENTIAL (CANCER CENTER ONLY)
Abs Immature Granulocytes: 0.04 10*3/uL (ref 0.00–0.07)
Basophils Absolute: 0.1 10*3/uL (ref 0.0–0.1)
Basophils Relative: 1 %
Eosinophils Absolute: 0.3 10*3/uL (ref 0.0–0.5)
Eosinophils Relative: 4 %
HCT: 39.3 % (ref 39.0–52.0)
Hemoglobin: 13.8 g/dL (ref 13.0–17.0)
Immature Granulocytes: 1 %
Lymphocytes Relative: 27 %
Lymphs Abs: 1.8 10*3/uL (ref 0.7–4.0)
MCH: 31.8 pg (ref 26.0–34.0)
MCHC: 35.1 g/dL (ref 30.0–36.0)
MCV: 90.6 fL (ref 80.0–100.0)
Monocytes Absolute: 0.7 10*3/uL (ref 0.1–1.0)
Monocytes Relative: 10 %
Neutro Abs: 4 10*3/uL (ref 1.7–7.7)
Neutrophils Relative %: 57 %
Platelet Count: 236 10*3/uL (ref 150–400)
RBC: 4.34 MIL/uL (ref 4.22–5.81)
RDW: 14.6 % (ref 11.5–15.5)
WBC Count: 6.9 10*3/uL (ref 4.0–10.5)
nRBC: 0 % (ref 0.0–0.2)

## 2023-08-03 MED ORDER — DEXTROSE 5 % IV SOLN
56.0000 mg/m2 | Freq: Once | INTRAVENOUS | Status: AC
  Administered 2023-08-03: 100 mg via INTRAVENOUS
  Filled 2023-08-03: qty 30

## 2023-08-03 MED ORDER — SODIUM CHLORIDE 0.9% FLUSH
10.0000 mL | Freq: Once | INTRAVENOUS | Status: AC
  Administered 2023-08-03: 10 mL

## 2023-08-03 MED ORDER — ACETAMINOPHEN 325 MG PO TABS
650.0000 mg | ORAL_TABLET | Freq: Once | ORAL | Status: AC
  Administered 2023-08-03: 650 mg via ORAL
  Filled 2023-08-03: qty 2

## 2023-08-03 MED ORDER — FAMOTIDINE IN NACL 20-0.9 MG/50ML-% IV SOLN
20.0000 mg | Freq: Once | INTRAVENOUS | Status: AC
  Administered 2023-08-03: 20 mg via INTRAVENOUS
  Filled 2023-08-03: qty 50

## 2023-08-03 MED ORDER — SODIUM CHLORIDE 0.9% FLUSH
10.0000 mL | INTRAVENOUS | Status: DC | PRN
  Administered 2023-08-03: 10 mL

## 2023-08-03 MED ORDER — DEXAMETHASONE SODIUM PHOSPHATE 10 MG/ML IJ SOLN
6.0000 mg | Freq: Once | INTRAMUSCULAR | Status: AC
  Administered 2023-08-03: 6 mg via INTRAVENOUS
  Filled 2023-08-03: qty 1

## 2023-08-03 MED ORDER — SODIUM CHLORIDE 0.9 % IV SOLN: Freq: Once | INTRAVENOUS | Status: AC

## 2023-08-03 MED ORDER — LENALIDOMIDE 10 MG PO CAPS
10.0000 mg | ORAL_CAPSULE | Freq: Every day | ORAL | 0 refills | Status: AC
Start: 2023-08-03 — End: ?

## 2023-08-03 MED ORDER — HEPARIN SOD (PORK) LOCK FLUSH 100 UNIT/ML IV SOLN
500.0000 [IU] | Freq: Once | INTRAVENOUS | Status: AC | PRN
  Administered 2023-08-03: 500 [IU]

## 2023-08-03 MED ORDER — ACYCLOVIR 400 MG PO TABS
400.0000 mg | ORAL_TABLET | Freq: Every day | ORAL | 6 refills | Status: DC
Start: 2023-08-03 — End: 2024-08-15

## 2023-08-03 NOTE — Progress Notes (Signed)
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  INTERVAL HISTORY:   Andres Fernandez is here for continued evaluation and management of his multiple myeloma and continued Carfilzomib maintenance.    Patient was last seen by me on 05/11/2023 and was recovering from a penile implant and was doing well overall with no new medical concerns.  Today, he reports that he is doing well overall since his last visit. Patient has been tolerating Carfilzomib well with no major toxicities. He has been sleeping and eating well and denies any infection issues, dental issues, tingling/numbness, diarrhea, rashes, testicular pain/swelling, leg swelling or abdominal pain. Patient denies any issues with his port-a-cath. He reports that he received the RSV vaccination last year. Patient continues to take Aspirin regularly.   REVIEW OF SYSTEMS:    10 Point review of Systems was done is negative except as noted above.   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   Tobacco abuse     SURGICAL HISTORY: Past Surgical History:  Procedure Laterality Date   APPENDECTOMY     FEMUR IM NAIL Right 11/20/2017   Procedure: RIGHT FEMORAL INTRAMEDULLARY (IM) NAIL;  Surgeon: Yolonda Kida, MD;  Location: [facility] OR;  Service: Orthopedics;  Laterality: Right;   IR IMAGING GUIDED PORT INSERTION  08/19/2021    SOCIAL HISTORY: Social History   Socioeconomic History   Marital status: Single    Spouse name: Not on file   Number of children: Not on file   Years of education: Not on file   Highest education level: Not on file  Occupational History   Not on file   Tobacco Use   Smoking status: Every Day    Current packs/day: 0.00    Types: Cigarettes    Last attempt to quit: 11/09/2017    Years since quitting: 5.7   Smokeless tobacco: Never   Tobacco comments:    1 cigarette/day as of 09/25/18  Vaping Use   Vaping status: Never Used  Substance and Sexual Activity   Alcohol use: Yes   Drug use: No   Sexual activity: Not on file  Other Topics Concern   Not on file  Social History Narrative   Not on file   Social Determinants of Health   Financial Resource Strain: Not on file  Food Insecurity: Low Risk  (04/24/2023)   [facility]   Food vital sign    Within the past 12 months, you worried that your food would run out before you got money to buy more: Never true    Within the past 12 months, the food you bought just didn't last and you didn't have money to get more. : Never true  Transportation Needs: Not on file (04/24/2023)  Physical Activity: Not on file  Stress: Not on file  Social Connections: Not on file  Intimate Partner Violence: Not on file    FAMILY HISTORY: Family History  Problem Relation Age of Onset   COPD Mother    Liver cancer Mother     ALLERGIES:  has No Known Allergies.  MEDICATIONS:  . Current Outpatient Medications on File Prior to Visit  Medication  Sig Dispense Refill   acyclovir (ZOVIRAX) 400 MG tablet Take 1 tablet (400 mg total) by mouth daily. 90 tablet 3   Albuterol Sulfate (PROAIR RESPICLICK) 108 (90 Base) MCG/ACT AEPB Inhale into the lungs. (Patient not taking: Reported on 09/01/2022)     aspirin EC 81 MG tablet Take 1 tablet (81 mg total) by mouth daily. 60 tablet 11   azithromycin (ZITHROMAX) 250 MG tablet 2 tab (500mg ) for 1 days and then 1 tab (250mg ) daily for 4 days (Patient not taking: Reported on 11/24/2022) 6 each 0   cholecalciferol (VITAMIN D3) 10 MCG (400 UNIT) TABS tablet Take 1 tablet (400 Units total) by mouth at bedtime. (Patient not taking: Reported on  11/24/2022) 30 tablet 3   ferrous sulfate 325 (65 FE) MG EC tablet TAKE 1 TABLET BY MOUTH EVERY DAY WITH BREAKFAST 90 tablet 1   FLOVENT DISKUS 250 MCG/ACT AEPB Inhale into the lungs. (Patient not taking: Reported on 09/01/2022)     ipratropium-albuterol (DUONEB) 0.5-2.5 (3) MG/3ML SOLN Inhale into the lungs. (Patient not taking: Reported on 09/01/2022)     lenalidomide (REVLIMID) 10 MG capsule Take 1 capsule (10 mg total) by mouth daily. Take 1 capsule (10 mg) by mouth daily for 21 days and then take 7 days off. 21 capsule 0   lidocaine-prilocaine (EMLA) cream Apply 1 application topically as needed. Apply to Port-A-Cath at least 1 hr prior to treatment. 30 g 0   Multiple Vitamins-Minerals (MULTIVITAMIN WITH MINERALS) tablet Take 1 tablet by mouth at bedtime.     nicotine (NICODERM CQ - DOSED IN MG/24 HOURS) 14 mg/24hr patch PLACE 1 PATCH (14 MG TOTAL) ONTO THE SKIN DAILY. 28 patch 1   ondansetron (ZOFRAN) 4 MG tablet Take 2 tablets (8 mg total) by mouth every 8 (eight) hours as needed for nausea. (Patient not taking: Reported on 03/02/2023) 30 tablet 0   predniSONE (DELTASONE) 20 MG tablet Take 2 tablets (40 mg total) by mouth daily with breakfast. For the next four days (Patient not taking: Reported on 09/01/2022) 8 tablet 0   Vitamin D, Ergocalciferol, (DRISDOL) 1.25 MG (50000 UNIT) CAPS capsule Take 1 capsule (50,000 Units total) by mouth once a week. 12 capsule 6   Current Facility-Administered Medications on File Prior to Visit  Medication Dose Route Frequency Provider Last Rate Last Admin   acetaminophen (TYLENOL) 325 MG tablet            famotidine (PEPCID) 20 MG tablet             PHYSICAL EXAMINATION:  ECOG FS:1 - Symptomatic but completely ambulatory VSS Wt Readings from Last 3 Encounters:  07/20/23 162 lb 12 oz (73.8 kg)  07/06/23 160 lb 8 oz (72.8 kg)  06/22/23 161 lb (73 kg)    GENERAL:alert, in no acute distress and comfortable SKIN: no acute rashes, no significant  lesions EYES: conjunctiva are pink and non-injected, sclera anicteric OROPHARYNX: MMM, no exudates, no oropharyngeal erythema or ulceration NECK: supple, no JVD LYMPH:  no palpable lymphadenopathy in the cervical, axillary or inguinal regions LUNGS: clear to auscultation b/l with normal respiratory effort HEART: regular rate & rhythm ABDOMEN:  normoactive bowel sounds , non tender, not distended. Extremity: no pedal edema PSYCH: alert & oriented x 3 with fluent speech NEURO: no focal motor/sensory deficits   LABORATORY DATA:  I have reviewed the data as listed     Latest Ref Rng & Units 07/20/2023    8:14 AM 07/06/2023    8:19 AM  06/22/2023    7:48 AM  CBC  WBC 4.0 - 10.5 K/uL 8.2  8.0  8.0   Hemoglobin 13.0 - 17.0 g/dL 52.8  41.3  24.4   Hematocrit 39.0 - 52.0 % 40.0  39.7  38.8   Platelets 150 - 400 K/uL 264  258  256    CBC    Component Value Date/Time   WBC 8.2 07/20/2023 0814   WBC 10.1 08/23/2022 0835   RBC 4.42 07/20/2023 0814   HGB 14.3 07/20/2023 0814   HGB 8.3 (L) 12/14/2017 1230   HCT 40.0 07/20/2023 0814   HCT 23.9 (L) 12/14/2017 1230   PLT 264 07/20/2023 0814   PLT 524 (H) 12/14/2017 1230   MCV 90.5 07/20/2023 0814   MCV 88.4 12/14/2017 1230   MCH 32.4 07/20/2023 0814   MCHC 35.8 07/20/2023 0814   RDW 14.5 07/20/2023 0814   RDW 15.3 (H) 12/14/2017 1230   LYMPHSABS 2.3 07/20/2023 0814   LYMPHSABS 1.4 12/14/2017 1230   MONOABS 0.9 07/20/2023 0814   MONOABS 0.6 12/14/2017 1230   EOSABS 0.2 07/20/2023 0814   EOSABS 0.1 12/14/2017 1230   BASOSABS 0.1 07/20/2023 0814   BASOSABS 0.1 12/14/2017 1230        Latest Ref Rng & Units 07/20/2023    8:14 AM 07/06/2023    8:19 AM 06/22/2023    7:48 AM  CMP  Glucose 70 - 99 mg/dL 96  93  70   BUN 8 - 23 mg/dL 22  22  24    Creatinine 0.61 - 1.24 mg/dL 0.10  2.72  5.36   Sodium 135 - 145 mmol/L 139  141  141   Potassium 3.5 - 5.1 mmol/L 4.4  4.7  3.9   Chloride 98 - 111 mmol/L 108  108  110   CO2 22 - 32 mmol/L 25   26  23    Calcium 8.9 - 10.3 mg/dL 9.1  9.5  9.2   Total Protein 6.5 - 8.1 g/dL 7.0  6.9  6.8   Total Bilirubin 0.3 - 1.2 mg/dL 0.5  0.5  0.3   Alkaline Phos 38 - 126 U/L 65  71  61   AST 15 - 41 U/L 10  10  11    ALT 0 - 44 U/L 9  11  10       03/09/20 ([id number]) Bone Marrow Biopsy at [facility]   07/02/2019 Bone Marrow Biopsy ([id number]) at [facility]    08/07/18 BM Bx:        RADIOGRAPHIC STUDIES: I have personally reviewed the radiological images as listed and agreed with the findings in the report. No results found.  ASSESSMENT & PLAN:    67 y.o. 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   Initial BM Bx showed >95% involvement with kappa restricted serum free light chains. 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 MoL Cy-   08/07/18 BM Bx revealed normocellular bone marrow with trilineage hematopoiesis and 1% plasma cells which indicates the pt is in remission 08/06/18 PET/CT revealed  Expansile lytic lesion involving the left iliac bone, unchanged. Lytic lesion involving the sternum, unchanged. While both are suspicious for myeloma, neither lesion is FDG avid. No hypermetabolic osseous lesions  in the visualized axial and appendicular skeleton. Please note that the bilateral lower extremities were not imaged. No suspicious lymphadenopathy. Spleen is normal in size.    Pt began Ninlaro 3mg  maintenance after pt finished 13 cycles of CyBorD and BM bx showed only 1% plasma cells, then resumed CyBorD and held Ninlaro in anticipation of his tandem bone marrow transplants beginning in late December 2019 with Dr. Shara Blazing at [facility]   11/19/18 High dose chemotherapy  with Melphalan 140mg /m2 and autologous stem cell rescue  03/20/19 High dose chemotherapy with Melphalan 200mg /m2 and autologous stem cell rescue. Non-infectious diarrhea, uncomplicated course otherwise.  07/02/2019 PET scan with results revealing "1. No FDG avid bone lesions are identified. 2.  Asymmetric uptake within the right, greater than left, palatine tonsils with mild fullness of the right tonsillar soft tissue. ENT consult vs attention on follow up suggested. 3.  Innumerable mixed sclerotic and lytic lesions throughout the axial and appendicular skeleton without associated hypermetabolic activity. 4.  Ancillary CT findings as above."  07/02/2019 bone marrow biopsy ([id number])with results showing: "Trilineage hematopoieses with maturation. No plasma cell myeloma identified. Mild anemia and No Rouleaux or circulating plasma cells identified in the peripheral blood."  2) s/p Prophylactic IM nailing for rt femur  completed Physical Therapy at the [facility].  -patient previously reported improvement and is currently progressively back to full time work.  3) Thrombocytopenia resolved   4) Renal insufficiency --likely related to multiple myeloma.  Remained fairly stable with baseline creatinine of 1.5-2.1.  Creatinine is 1.44 today Plan -maintain follow up with nephrology referral to Dr Arrie [facility] for continued treatment -Pt completed 8 weeks of maverick for hep c with Dr Luciana Axe on 07/08/18.  -Kidney numbers improved. Creatinine in the 1.7-2.2 range   5) h/o tobacco abuse-counseled on smoking cessation -Currently not smoking.   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 Hemoglobin is stable at 12.8  PLAN:  -Discussed lab results on 08/03/2023 in detail with patient. CBC normal,  showed WBC of 6.9K, hemoglobin of 13.8, and platelets of 236K. -CMP shows stable kidney function -patient is  nearly 6 years into remission at this time -last myeloma panel from 4 weeks ago showed that M protein was not observed and patient continued to be in remission -Patient has no lab or clinical evidence of progression of his plasma cell leukemia/multiple myeloma  -patient has no new or severe toxicities from his last treatment -will stop Aredia -continue Revlimid -continue maintenance Carfilzomib every two weeks -will refill acyclovir -recommend patient to stay UTD with age-appropriate vaccinations including influenza and newest COVID-19 booster injection as soon as possible  FOLLOW-UP: ***  The total time spent in the appointment was *** minutes* .  All of the patient's questions were answered with apparent satisfaction. The patient knows to call the clinic with any problems, questions or concerns.   Wyvonnia Lora MD MS AAHIVMS [facility] Hematology/Oncology Physician [facility]  .*Total Encounter Time as defined by the Centers for Medicare and Medicaid Services includes, in addition to the face-to-face time of a patient visit (documented in the note above) non-face-to-face time: obtaining and reviewing outside history, ordering and reviewing medications, tests or procedures, care coordination (communications with other health care professionals or caregivers) and documentation in the medical record.    I,Mitra Faeizi,acting as a Neurosurgeon for Wyvonnia Lora, MD.,have documented all relevant documentation on the  behalf of Wyvonnia Lora, MD,as directed by  Wyvonnia Lora, MD while in the presence of Wyvonnia Lora, MD.  ***

## 2023-08-03 NOTE — Progress Notes (Signed)
Pt due for Aredia today. Pt stated, "I am not prepared to stay for it today, I thought I was getting it in September." This RN verified with Pt that he prefers to delay Aredia until September appt. This RN made [facility] and Dr. Candise Che aware. Per Dr. Candise Che OK to postpone Aredia, but proceed with Kyprolis today as planned.

## 2023-08-03 NOTE — Patient Instructions (Signed)
[place] [facility]  Discharge Instructions: Thank you for choosing [facility] to provide your oncology and hematology care.   If you have a lab appointment with the Cancer Center, please go directly to the Cancer Center and check in at the registration area.   Wear comfortable clothing and clothing appropriate for easy access to any Portacath or PICC line.   We strive to give you quality time with your provider. You may need to reschedule your appointment if you arrive late (15 or more minutes).  Arriving late affects you and other patients whose appointments are after yours.  Also, if you miss three or more appointments without notifying the office, you may be dismissed from the clinic at the provider's discretion.      For prescription refill requests, have your pharmacy contact our office and allow 72 hours for refills to be completed.    Today you received the following chemotherapy and/or immunotherapy agents :  Kyprolis   To help prevent nausea and vomiting after your treatment, we encourage you to take your nausea medication as directed.  BELOW ARE SYMPTOMS THAT SHOULD BE REPORTED IMMEDIATELY: *FEVER GREATER THAN 100.4 F (38 C) OR HIGHER *CHILLS OR SWEATING *NAUSEA AND VOMITING THAT IS NOT CONTROLLED WITH YOUR NAUSEA MEDICATION *UNUSUAL SHORTNESS OF BREATH *UNUSUAL BRUISING OR BLEEDING *URINARY PROBLEMS (pain or burning when urinating, or frequent urination) *BOWEL PROBLEMS (unusual diarrhea, constipation, pain near the anus) TENDERNESS IN MOUTH AND THROAT WITH OR WITHOUT PRESENCE OF ULCERS (sore throat, sores in mouth, or a toothache) UNUSUAL RASH, SWELLING OR PAIN  UNUSUAL VAGINAL DISCHARGE OR ITCHING   Items with * indicate a potential emergency and should be followed up as soon as possible or go to the Emergency Department if any problems should occur.  Please show the CHEMOTHERAPY ALERT CARD or IMMUNOTHERAPY ALERT CARD at  check-in to the Emergency Department and triage nurse.  Should you have questions after your visit or need to cancel or reschedule your appointment, please contact [place] [facility]  Dept: [phone]  and follow the prompts.  Office hours are 8:00 a.m. to 4:30 p.m. Monday - Friday. Please note that voicemails left after 4:00 p.m. may not be returned until the following business day.  We are closed weekends and major holidays. You have access to a nurse at all times for urgent questions. Please call the main number to the clinic Dept: [phone] and follow the prompts.   For any non-urgent questions, you may also contact your provider using MyChart. We now offer e-Visits for anyone 18 and older to request care online for non-urgent symptoms. For details visit [email or web address].   Also download the MyChart app! Go to the app store, search "MyChart", open the app, select [facility], and log in with your MyChart username and password.   

## 2023-08-09 ENCOUNTER — Encounter: Payer: Self-pay | Admitting: Hematology

## 2023-08-10 LAB — MULTIPLE MYELOMA PANEL, SERUM
Albumin SerPl Elph-Mcnc: 3.8 g/dL (ref 2.9–4.4)
Albumin/Glob SerPl: 1.6 (ref 0.7–1.7)
Alpha 1: 0.2 g/dL (ref 0.0–0.4)
Alpha2 Glob SerPl Elph-Mcnc: 1 g/dL (ref 0.4–1.0)
B-Globulin SerPl Elph-Mcnc: 0.8 g/dL (ref 0.7–1.3)
Gamma Glob SerPl Elph-Mcnc: 0.5 g/dL (ref 0.4–1.8)
Globulin, Total: 2.5 g/dL (ref 2.2–3.9)
IgA: 63 mg/dL (ref 61–437)
IgG (Immunoglobin G), Serum: 706 mg/dL (ref 603–1613)
IgM (Immunoglobulin M), Srm: 6 mg/dL — ABNORMAL LOW (ref 20–172)
Total Protein ELP: 6.3 g/dL (ref 6.0–8.5)

## 2023-08-15 ENCOUNTER — Other Ambulatory Visit: Payer: Self-pay

## 2023-08-17 ENCOUNTER — Other Ambulatory Visit: Payer: Medicare Other

## 2023-08-17 ENCOUNTER — Inpatient Hospital Stay: Payer: Medicare Other

## 2023-08-17 ENCOUNTER — Inpatient Hospital Stay: Payer: Medicare Other | Attending: Hematology

## 2023-08-17 ENCOUNTER — Ambulatory Visit: Payer: Medicare Other

## 2023-08-17 VITALS — BP 139/79 | HR 72 | Temp 97.8°F | Resp 18 | Wt 163.0 lb

## 2023-08-17 DIAGNOSIS — Z95828 Presence of other vascular implants and grafts: Secondary | ICD-10-CM

## 2023-08-17 DIAGNOSIS — C9010 Plasma cell leukemia not having achieved remission: Secondary | ICD-10-CM

## 2023-08-17 DIAGNOSIS — Z5111 Encounter for antineoplastic chemotherapy: Secondary | ICD-10-CM | POA: Diagnosis present

## 2023-08-17 DIAGNOSIS — M84551A Pathological fracture in neoplastic disease, right femur, initial encounter for fracture: Secondary | ICD-10-CM

## 2023-08-17 DIAGNOSIS — C9001 Multiple myeloma in remission: Secondary | ICD-10-CM | POA: Insufficient documentation

## 2023-08-17 DIAGNOSIS — Z7189 Other specified counseling: Secondary | ICD-10-CM

## 2023-08-17 DIAGNOSIS — C9011 Plasma cell leukemia in remission: Secondary | ICD-10-CM | POA: Diagnosis not present

## 2023-08-17 LAB — CMP (CANCER CENTER ONLY)
ALT: 10 U/L (ref 0–44)
AST: 10 U/L — ABNORMAL LOW (ref 15–41)
Albumin: 4.1 g/dL (ref 3.5–5.0)
Alkaline Phosphatase: 69 U/L (ref 38–126)
Anion gap: 6 (ref 5–15)
BUN: 27 mg/dL — ABNORMAL HIGH (ref 8–23)
CO2: 25 mmol/L (ref 22–32)
Calcium: 8.9 mg/dL (ref 8.9–10.3)
Chloride: 108 mmol/L (ref 98–111)
Creatinine: 1.73 mg/dL — ABNORMAL HIGH (ref 0.61–1.24)
GFR, Estimated: 43 mL/min — ABNORMAL LOW (ref >60)
Glucose, Bld: 110 mg/dL — ABNORMAL HIGH (ref 70–99)
Potassium: 4.1 mmol/L (ref 3.5–5.1)
Sodium: 139 mmol/L (ref 135–145)
Total Bilirubin: 0.6 mg/dL (ref 0.3–1.2)
Total Protein: 6.9 g/dL (ref 6.5–8.1)

## 2023-08-17 LAB — CBC WITH DIFFERENTIAL (CANCER CENTER ONLY)
Abs Immature Granulocytes: 0.04 10*3/uL (ref 0.00–0.07)
Basophils Absolute: 0.1 10*3/uL (ref 0.0–0.1)
Basophils Relative: 1 %
Eosinophils Absolute: 0.3 10*3/uL (ref 0.0–0.5)
Eosinophils Relative: 3 %
HCT: 38.3 % — ABNORMAL LOW (ref 39.0–52.0)
Hemoglobin: 13.4 g/dL (ref 13.0–17.0)
Immature Granulocytes: 1 %
Lymphocytes Relative: 27 %
Lymphs Abs: 2.1 10*3/uL (ref 0.7–4.0)
MCH: 31.8 pg (ref 26.0–34.0)
MCHC: 35 g/dL (ref 30.0–36.0)
MCV: 90.8 fL (ref 80.0–100.0)
Monocytes Absolute: 1.3 10*3/uL — ABNORMAL HIGH (ref 0.1–1.0)
Monocytes Relative: 17 %
Neutro Abs: 4 10*3/uL (ref 1.7–7.7)
Neutrophils Relative %: 51 %
Platelet Count: 209 10*3/uL (ref 150–400)
RBC: 4.22 MIL/uL (ref 4.22–5.81)
RDW: 14.8 % (ref 11.5–15.5)
WBC Count: 7.7 10*3/uL (ref 4.0–10.5)
nRBC: 0 % (ref 0.0–0.2)

## 2023-08-17 MED ORDER — SODIUM CHLORIDE 0.9 % IV SOLN: Freq: Once | INTRAVENOUS | Status: AC

## 2023-08-17 MED ORDER — DEXAMETHASONE SODIUM PHOSPHATE 10 MG/ML IJ SOLN
6.0000 mg | Freq: Once | INTRAMUSCULAR | Status: AC
  Administered 2023-08-17: 6 mg via INTRAVENOUS
  Filled 2023-08-17: qty 1

## 2023-08-17 MED ORDER — ACETAMINOPHEN 325 MG PO TABS
650.0000 mg | ORAL_TABLET | Freq: Once | ORAL | Status: AC
  Administered 2023-08-17: 650 mg via ORAL
  Filled 2023-08-17: qty 2

## 2023-08-17 MED ORDER — HEPARIN SOD (PORK) LOCK FLUSH 100 UNIT/ML IV SOLN
500.0000 [IU] | Freq: Once | INTRAVENOUS | Status: AC | PRN
  Administered 2023-08-17: 500 [IU]

## 2023-08-17 MED ORDER — SODIUM CHLORIDE 0.9% FLUSH
10.0000 mL | INTRAVENOUS | Status: DC | PRN
  Administered 2023-08-17: 10 mL

## 2023-08-17 MED ORDER — SODIUM CHLORIDE 0.9 % IV SOLN: Freq: Once | INTRAVENOUS | Status: DC

## 2023-08-17 MED ORDER — SODIUM CHLORIDE 0.9% FLUSH
10.0000 mL | Freq: Once | INTRAVENOUS | Status: AC
  Administered 2023-08-17: 10 mL

## 2023-08-17 MED ORDER — DEXTROSE 5 % IV SOLN
56.0000 mg/m2 | Freq: Once | INTRAVENOUS | Status: AC
  Administered 2023-08-17: 100 mg via INTRAVENOUS
  Filled 2023-08-17: qty 30

## 2023-08-17 MED ORDER — FAMOTIDINE IN NACL 20-0.9 MG/50ML-% IV SOLN
20.0000 mg | Freq: Once | INTRAVENOUS | Status: AC
  Administered 2023-08-17: 20 mg via INTRAVENOUS
  Filled 2023-08-17: qty 50

## 2023-08-17 NOTE — Progress Notes (Signed)
Pt states he does not want to get Aredia today. He wants to delay Aredia treatment until September 20th. Dr. Candise Che and Vanessa Kick made aware.   Per MD, ok to treat with SCR 1.73 today.

## 2023-08-17 NOTE — Patient Instructions (Signed)
[place] [facility]  Discharge Instructions: Thank you for choosing [facility] to provide your oncology and hematology care.   If you have a lab appointment with the Cancer Center, please go directly to the Cancer Center and check in at the registration area.   Wear comfortable clothing and clothing appropriate for easy access to any Portacath or PICC line.   We strive to give you quality time with your provider. You may need to reschedule your appointment if you arrive late (15 or more minutes).  Arriving late affects you and other patients whose appointments are after yours.  Also, if you miss three or more appointments without notifying the office, you may be dismissed from the clinic at the provider's discretion.      For prescription refill requests, have your pharmacy contact our office and allow 72 hours for refills to be completed.    Today you received the following chemotherapy and/or immunotherapy agents :  Kyprolis   To help prevent nausea and vomiting after your treatment, we encourage you to take your nausea medication as directed.  BELOW ARE SYMPTOMS THAT SHOULD BE REPORTED IMMEDIATELY: *FEVER GREATER THAN 100.4 F (38 C) OR HIGHER *CHILLS OR SWEATING *NAUSEA AND VOMITING THAT IS NOT CONTROLLED WITH YOUR NAUSEA MEDICATION *UNUSUAL SHORTNESS OF BREATH *UNUSUAL BRUISING OR BLEEDING *URINARY PROBLEMS (pain or burning when urinating, or frequent urination) *BOWEL PROBLEMS (unusual diarrhea, constipation, pain near the anus) TENDERNESS IN MOUTH AND THROAT WITH OR WITHOUT PRESENCE OF ULCERS (sore throat, sores in mouth, or a toothache) UNUSUAL RASH, SWELLING OR PAIN  UNUSUAL VAGINAL DISCHARGE OR ITCHING   Items with * indicate a potential emergency and should be followed up as soon as possible or go to the Emergency Department if any problems should occur.  Please show the CHEMOTHERAPY ALERT CARD or IMMUNOTHERAPY ALERT CARD at  check-in to the Emergency Department and triage nurse.  Should you have questions after your visit or need to cancel or reschedule your appointment, please contact [place] [facility]  Dept: [phone]  and follow the prompts.  Office hours are 8:00 a.m. to 4:30 p.m. Monday - Friday. Please note that voicemails left after 4:00 p.m. may not be returned until the following business day.  We are closed weekends and major holidays. You have access to a nurse at all times for urgent questions. Please call the main number to the clinic Dept: [phone] and follow the prompts.   For any non-urgent questions, you may also contact your provider using MyChart. We now offer e-Visits for anyone 18 and older to request care online for non-urgent symptoms. For details visit [email or web address].   Also download the MyChart app! Go to the app store, search "MyChart", open the app, select [facility], and log in with your MyChart username and password.   

## 2023-08-20 LAB — KAPPA/LAMBDA LIGHT CHAINS
Kappa free light chain: 18.8 mg/L (ref 3.3–19.4)
Kappa, lambda light chain ratio: 1.29 (ref 0.26–1.65)
Lambda free light chains: 14.6 mg/L (ref 5.7–26.3)

## 2023-08-27 ENCOUNTER — Other Ambulatory Visit: Payer: Self-pay

## 2023-08-28 ENCOUNTER — Other Ambulatory Visit: Payer: Self-pay

## 2023-08-31 ENCOUNTER — Ambulatory Visit: Payer: Medicare Other

## 2023-08-31 ENCOUNTER — Inpatient Hospital Stay: Payer: Medicare Other

## 2023-08-31 ENCOUNTER — Inpatient Hospital Stay: Payer: Medicare Other | Admitting: Hematology

## 2023-08-31 ENCOUNTER — Other Ambulatory Visit: Payer: Medicare Other

## 2023-08-31 VITALS — BP 133/89 | HR 83 | Temp 98.6°F | Resp 16

## 2023-08-31 DIAGNOSIS — C9001 Multiple myeloma in remission: Secondary | ICD-10-CM | POA: Diagnosis not present

## 2023-08-31 DIAGNOSIS — Z5111 Encounter for antineoplastic chemotherapy: Secondary | ICD-10-CM | POA: Diagnosis not present

## 2023-08-31 DIAGNOSIS — C9010 Plasma cell leukemia not having achieved remission: Secondary | ICD-10-CM

## 2023-08-31 DIAGNOSIS — Z95828 Presence of other vascular implants and grafts: Secondary | ICD-10-CM

## 2023-08-31 DIAGNOSIS — Z7189 Other specified counseling: Secondary | ICD-10-CM

## 2023-08-31 DIAGNOSIS — M84551A Pathological fracture in neoplastic disease, right femur, initial encounter for fracture: Secondary | ICD-10-CM

## 2023-08-31 LAB — CBC WITH DIFFERENTIAL (CANCER CENTER ONLY)
Abs Immature Granulocytes: 0.02 10*3/uL (ref 0.00–0.07)
Basophils Absolute: 0.1 10*3/uL (ref 0.0–0.1)
Basophils Relative: 2 %
Eosinophils Absolute: 0.1 10*3/uL (ref 0.0–0.5)
Eosinophils Relative: 2 %
HCT: 39.8 % (ref 39.0–52.0)
Hemoglobin: 13.7 g/dL (ref 13.0–17.0)
Immature Granulocytes: 0 %
Lymphocytes Relative: 37 %
Lymphs Abs: 2.3 10*3/uL (ref 0.7–4.0)
MCH: 31.6 pg (ref 26.0–34.0)
MCHC: 34.4 g/dL (ref 30.0–36.0)
MCV: 91.7 fL (ref 80.0–100.0)
Monocytes Absolute: 0.9 10*3/uL (ref 0.1–1.0)
Monocytes Relative: 15 %
Neutro Abs: 2.7 10*3/uL (ref 1.7–7.7)
Neutrophils Relative %: 44 %
Platelet Count: 289 10*3/uL (ref 150–400)
RBC: 4.34 MIL/uL (ref 4.22–5.81)
RDW: 15.1 % (ref 11.5–15.5)
WBC Count: 6.1 10*3/uL (ref 4.0–10.5)
nRBC: 0 % (ref 0.0–0.2)

## 2023-08-31 LAB — CMP (CANCER CENTER ONLY)
ALT: 12 U/L (ref 0–44)
AST: 14 U/L — ABNORMAL LOW (ref 15–41)
Albumin: 4.1 g/dL (ref 3.5–5.0)
Alkaline Phosphatase: 77 U/L (ref 38–126)
Anion gap: 7 (ref 5–15)
BUN: 25 mg/dL — ABNORMAL HIGH (ref 8–23)
CO2: 25 mmol/L (ref 22–32)
Calcium: 9.2 mg/dL (ref 8.9–10.3)
Chloride: 111 mmol/L (ref 98–111)
Creatinine: 1.65 mg/dL — ABNORMAL HIGH (ref 0.61–1.24)
GFR, Estimated: 46 mL/min — ABNORMAL LOW (ref >60)
Glucose, Bld: 87 mg/dL (ref 70–99)
Potassium: 4.2 mmol/L (ref 3.5–5.1)
Sodium: 143 mmol/L (ref 135–145)
Total Bilirubin: 0.5 mg/dL (ref 0.3–1.2)
Total Protein: 7.1 g/dL (ref 6.5–8.1)

## 2023-08-31 MED ORDER — SODIUM CHLORIDE 0.9 % IV SOLN
60.0000 mg | Freq: Once | INTRAVENOUS | Status: DC
  Filled 2023-08-31: qty 10

## 2023-08-31 MED ORDER — ACETAMINOPHEN 325 MG PO TABS
650.0000 mg | ORAL_TABLET | Freq: Once | ORAL | Status: AC
  Administered 2023-08-31: 650 mg via ORAL
  Filled 2023-08-31: qty 2

## 2023-08-31 MED ORDER — DEXAMETHASONE SODIUM PHOSPHATE 10 MG/ML IJ SOLN
6.0000 mg | Freq: Once | INTRAMUSCULAR | Status: AC
  Administered 2023-08-31: 6 mg via INTRAVENOUS
  Filled 2023-08-31: qty 1

## 2023-08-31 MED ORDER — DEXTROSE 5 % IV SOLN
56.0000 mg/m2 | Freq: Once | INTRAVENOUS | Status: AC
  Administered 2023-08-31: 100 mg via INTRAVENOUS
  Filled 2023-08-31: qty 30

## 2023-08-31 MED ORDER — SODIUM CHLORIDE 0.9 % IV SOLN
60.0000 mg | Freq: Once | INTRAVENOUS | Status: AC
  Administered 2023-08-31: 60 mg via INTRAVENOUS
  Filled 2023-08-31: qty 20

## 2023-08-31 MED ORDER — FAMOTIDINE IN NACL 20-0.9 MG/50ML-% IV SOLN
20.0000 mg | Freq: Once | INTRAVENOUS | Status: AC
  Administered 2023-08-31: 20 mg via INTRAVENOUS
  Filled 2023-08-31: qty 50

## 2023-08-31 MED ORDER — HEPARIN SOD (PORK) LOCK FLUSH 100 UNIT/ML IV SOLN
500.0000 [IU] | Freq: Once | INTRAVENOUS | Status: AC | PRN
  Administered 2023-08-31: 500 [IU]

## 2023-08-31 MED ORDER — SODIUM CHLORIDE 0.9 % IV SOLN: Freq: Once | INTRAVENOUS | Status: AC

## 2023-08-31 MED ORDER — SODIUM CHLORIDE 0.9% FLUSH
10.0000 mL | Freq: Once | INTRAVENOUS | Status: AC
  Administered 2023-08-31: 10 mL

## 2023-08-31 MED ORDER — SODIUM CHLORIDE 0.9% FLUSH
10.0000 mL | INTRAVENOUS | Status: DC | PRN
  Administered 2023-08-31: 10 mL

## 2023-08-31 NOTE — Progress Notes (Signed)
Per Dr Candise Che, ok to treat with Scr 1.65 today.

## 2023-09-12 ENCOUNTER — Other Ambulatory Visit: Payer: Self-pay

## 2023-09-12 DIAGNOSIS — C9010 Plasma cell leukemia not having achieved remission: Secondary | ICD-10-CM

## 2023-09-12 MED ORDER — LENALIDOMIDE 10 MG PO CAPS
10.0000 mg | ORAL_CAPSULE | Freq: Every day | ORAL | 0 refills | Status: DC
Start: 2023-09-12 — End: 2023-10-12

## 2023-09-14 ENCOUNTER — Inpatient Hospital Stay: Payer: Medicare Other | Attending: Hematology

## 2023-09-14 ENCOUNTER — Other Ambulatory Visit: Payer: Medicare Other

## 2023-09-14 ENCOUNTER — Inpatient Hospital Stay: Payer: Medicare Other

## 2023-09-14 VITALS — BP 135/80 | HR 73 | Temp 97.9°F | Resp 16 | Ht 68.0 in | Wt 160.6 lb

## 2023-09-14 DIAGNOSIS — C9001 Multiple myeloma in remission: Secondary | ICD-10-CM | POA: Insufficient documentation

## 2023-09-14 DIAGNOSIS — Z5111 Encounter for antineoplastic chemotherapy: Secondary | ICD-10-CM | POA: Diagnosis present

## 2023-09-14 DIAGNOSIS — D630 Anemia in neoplastic disease: Secondary | ICD-10-CM | POA: Diagnosis not present

## 2023-09-14 DIAGNOSIS — N189 Chronic kidney disease, unspecified: Secondary | ICD-10-CM | POA: Insufficient documentation

## 2023-09-14 DIAGNOSIS — Z800 Family history of malignant neoplasm of digestive organs: Secondary | ICD-10-CM | POA: Diagnosis not present

## 2023-09-14 DIAGNOSIS — F1011 Alcohol abuse, in remission: Secondary | ICD-10-CM | POA: Diagnosis not present

## 2023-09-14 DIAGNOSIS — C9011 Plasma cell leukemia in remission: Secondary | ICD-10-CM | POA: Diagnosis not present

## 2023-09-14 DIAGNOSIS — J3489 Other specified disorders of nose and nasal sinuses: Secondary | ICD-10-CM | POA: Diagnosis not present

## 2023-09-14 DIAGNOSIS — Z7189 Other specified counseling: Secondary | ICD-10-CM

## 2023-09-14 DIAGNOSIS — C9010 Plasma cell leukemia not having achieved remission: Secondary | ICD-10-CM

## 2023-09-14 DIAGNOSIS — F1411 Cocaine abuse, in remission: Secondary | ICD-10-CM | POA: Diagnosis not present

## 2023-09-14 DIAGNOSIS — F17210 Nicotine dependence, cigarettes, uncomplicated: Secondary | ICD-10-CM | POA: Insufficient documentation

## 2023-09-14 LAB — CBC WITH DIFFERENTIAL (CANCER CENTER ONLY)
Abs Immature Granulocytes: 0.03 10*3/uL (ref 0.00–0.07)
Basophils Absolute: 0.1 10*3/uL (ref 0.0–0.1)
Basophils Relative: 1 %
Eosinophils Absolute: 0.3 10*3/uL (ref 0.0–0.5)
Eosinophils Relative: 4 %
HCT: 38.1 % — ABNORMAL LOW (ref 39.0–52.0)
Hemoglobin: 13.1 g/dL (ref 13.0–17.0)
Immature Granulocytes: 0 %
Lymphocytes Relative: 31 %
Lymphs Abs: 2.4 10*3/uL (ref 0.7–4.0)
MCH: 31.4 pg (ref 26.0–34.0)
MCHC: 34.4 g/dL (ref 30.0–36.0)
MCV: 91.4 fL (ref 80.0–100.0)
Monocytes Absolute: 1.3 10*3/uL — ABNORMAL HIGH (ref 0.1–1.0)
Monocytes Relative: 17 %
Neutro Abs: 3.6 10*3/uL (ref 1.7–7.7)
Neutrophils Relative %: 47 %
Platelet Count: 270 10*3/uL (ref 150–400)
RBC: 4.17 MIL/uL — ABNORMAL LOW (ref 4.22–5.81)
RDW: 15.1 % (ref 11.5–15.5)
WBC Count: 7.6 10*3/uL (ref 4.0–10.5)
nRBC: 0 % (ref 0.0–0.2)

## 2023-09-14 LAB — CMP (CANCER CENTER ONLY)
ALT: 10 U/L (ref 0–44)
AST: 10 U/L — ABNORMAL LOW (ref 15–41)
Albumin: 4.1 g/dL (ref 3.5–5.0)
Alkaline Phosphatase: 73 U/L (ref 38–126)
Anion gap: 8 (ref 5–15)
BUN: 20 mg/dL (ref 8–23)
CO2: 23 mmol/L (ref 22–32)
Calcium: 8.9 mg/dL (ref 8.9–10.3)
Chloride: 109 mmol/L (ref 98–111)
Creatinine: 1.51 mg/dL — ABNORMAL HIGH (ref 0.61–1.24)
GFR, Estimated: 51 mL/min — ABNORMAL LOW (ref >60)
Glucose, Bld: 86 mg/dL (ref 70–99)
Potassium: 3.9 mmol/L (ref 3.5–5.1)
Sodium: 140 mmol/L (ref 135–145)
Total Bilirubin: 0.6 mg/dL (ref 0.3–1.2)
Total Protein: 6.8 g/dL (ref 6.5–8.1)

## 2023-09-14 MED ORDER — DEXTROSE 5 % IV SOLN
56.0000 mg/m2 | Freq: Once | INTRAVENOUS | Status: AC
  Administered 2023-09-14: 100 mg via INTRAVENOUS
  Filled 2023-09-14: qty 50

## 2023-09-14 MED ORDER — SODIUM CHLORIDE 0.9 % IV SOLN: Freq: Once | INTRAVENOUS | Status: AC

## 2023-09-14 MED ORDER — SODIUM CHLORIDE 0.9% FLUSH
10.0000 mL | INTRAVENOUS | Status: DC | PRN
  Administered 2023-09-14: 10 mL

## 2023-09-14 MED ORDER — HEPARIN SOD (PORK) LOCK FLUSH 100 UNIT/ML IV SOLN
500.0000 [IU] | Freq: Once | INTRAVENOUS | Status: AC | PRN
  Administered 2023-09-14: 500 [IU]

## 2023-09-14 MED ORDER — ACETAMINOPHEN 325 MG PO TABS
650.0000 mg | ORAL_TABLET | Freq: Once | ORAL | Status: AC
  Administered 2023-09-14: 650 mg via ORAL
  Filled 2023-09-14: qty 2

## 2023-09-14 MED ORDER — FAMOTIDINE IN NACL 20-0.9 MG/50ML-% IV SOLN
20.0000 mg | Freq: Once | INTRAVENOUS | Status: AC
  Administered 2023-09-14: 20 mg via INTRAVENOUS
  Filled 2023-09-14: qty 50

## 2023-09-14 MED ORDER — DEXAMETHASONE SODIUM PHOSPHATE 10 MG/ML IJ SOLN
6.0000 mg | Freq: Once | INTRAMUSCULAR | Status: AC
  Administered 2023-09-14: 6 mg via INTRAVENOUS
  Filled 2023-09-14: qty 1

## 2023-09-14 NOTE — Patient Instructions (Signed)
[place] [facility]   Discharge Instructions: Thank you for choosing [facility] to provide your oncology and hematology care.   If you have a lab appointment with the Cancer Center, please go directly to the Cancer Center and check in at the registration area.   Wear comfortable clothing and clothing appropriate for easy access to any Portacath or PICC line.   We strive to give you quality time with your provider. You may need to reschedule your appointment if you arrive late (15 or more minutes).  Arriving late affects you and other patients whose appointments are after yours.  Also, if you miss three or more appointments without notifying the office, you may be dismissed from the clinic at the provider's discretion.      For prescription refill requests, have your pharmacy contact our office and allow 72 hours for refills to be completed.    Today you received the following chemotherapy and/or immunotherapy agents: Carfilzomib (Kyprolis)       To help prevent nausea and vomiting after your treatment, we encourage you to take your nausea medication as directed.  BELOW ARE SYMPTOMS THAT SHOULD BE REPORTED IMMEDIATELY: *FEVER GREATER THAN 100.4 F (38 C) OR HIGHER *CHILLS OR SWEATING *NAUSEA AND VOMITING THAT IS NOT CONTROLLED WITH YOUR NAUSEA MEDICATION *UNUSUAL SHORTNESS OF BREATH *UNUSUAL BRUISING OR BLEEDING *URINARY PROBLEMS (pain or burning when urinating, or frequent urination) *BOWEL PROBLEMS (unusual diarrhea, constipation, pain near the anus) TENDERNESS IN MOUTH AND THROAT WITH OR WITHOUT PRESENCE OF ULCERS (sore throat, sores in mouth, or a toothache) UNUSUAL RASH, SWELLING OR PAIN  UNUSUAL VAGINAL DISCHARGE OR ITCHING   Items with * indicate a potential emergency and should be followed up as soon as possible or go to the Emergency Department if any problems should occur.  Please show the CHEMOTHERAPY ALERT CARD or IMMUNOTHERAPY  ALERT CARD at check-in to the Emergency Department and triage nurse.  Should you have questions after your visit or need to cancel or reschedule your appointment, please contact [place] [facility]  Dept: [phone]  and follow the prompts.  Office hours are 8:00 a.m. to 4:30 p.m. Monday - Friday. Please note that voicemails left after 4:00 p.m. may not be returned until the following business day.  We are closed weekends and major holidays. You have access to a nurse at all times for urgent questions. Please call the main number to the clinic Dept: [phone] and follow the prompts.   For any non-urgent questions, you may also contact your provider using MyChart. We now offer e-Visits for anyone 18 and older to request care online for non-urgent symptoms. For details visit [email or web address].   Also download the MyChart app! Go to the app store, search "MyChart", open the app, select [facility], and log in with your MyChart username and password. 

## 2023-09-14 NOTE — Progress Notes (Signed)
Per Dr. Candise Che - okay to proceed with treatment with creatinine of 1.51.

## 2023-09-17 LAB — KAPPA/LAMBDA LIGHT CHAINS
Kappa free light chain: 16.2 mg/L (ref 3.3–19.4)
Kappa, lambda light chain ratio: 1.12 (ref 0.26–1.65)
Lambda free light chains: 14.5 mg/L (ref 5.7–26.3)

## 2023-09-27 NOTE — Progress Notes (Signed)
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  INTERVAL HISTORY:   Andres Fernandez is here for continued evaluation and management of his multiple myeloma and continued Carfilzomib maintenance.    Patient was last seen by me on 08/03/2023 and was doing well overall with no new medical concerns.   Today, he reports increased fatigue and SOB. He denies any increaed activy  He reports having a cold 2-3 weeks ago and notes that his fatigue was present prior to this.  Takes ***, 3 days later, pain in right side  Intermittent  Only when tired, has SOB  He notes that he has increased his activity level recently involving lifting boxes.    No stiffness  Right sided upper pain comes and goes  Tingling in chest  No fever  Walking is not limited  When sex, can't catch breath new  Unsure if congestion in chest Nose stuffed as well  Phlegm is white  Flu shot this year [facility] and covid 19 and pneumonia vaccine UTD  -could be cold  Improving  Robutusin  Mucinex for nose runny  -maybe SOB related to nose  -if persistent, more workup imaging of chest or heart [name]. Mainly from cold   When has pneumonia in bottom of lung previously,  Given albuterol  Not smoking at this time Last smoked yesterday one cig  He generally smokes two cigarettes a day at this time.    Does not use an inhaler  He was half a pack a while ago  Nicotine patch he continues to use during weekend. Triggered by frustration       Symptoms are intermittent. Including SOB  Cough more than usual  -sometimes cold trigger airway to get reactive from cold and especially  changes from smoking -inhaler as need  -no suggestion of bacterial infection at this time based on  WBC count  -he denies anyNo perissitent pain  Does not have a nebulizer machine at home  Takes acylivior and aspirin and Revlimid and multivitamin -will start high dose vitmain D  Off steroids for a year (flomase?) Flovent  Not on prednisone at this time.   -short acting inhaler as needed -Long acting inhaler -encouraged smoking cessation -refill patch to use during the day. Advised patient not to use  during the night.   Patient reports having a normal appetite.   Got covid vaccine and flu. RSV last yr  -will hold treatment today to allow patient to recover from cold. Will plan for next treatment in 2 weeks unless there is any new concern  -answered all of patient's questions in detail   He denies any back pain, abdominal pain  -lungs sounds good  -airway tightening, afrebrile.          -Discussed lab results on 09/28/23 in detail with patient. CBC normal, showed WBC of 6.9K, hemoglobin of 13.7, and platelets of 283K. -CMP showed stable CKD -Last K/L light chains from two weeks ago were normal and showed that patient continued to be in remission  -continue Revlimid maintainence -continue maintenance Carfilzomib every two weeks      Patient has been tolerating Carfilzomib well with no major toxicities.   REVIEW OF SYSTEMS:    10 Point review of Systems was done is negative except as noted above.   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   Tobacco abuse     SURGICAL HISTORY: Past Surgical History:  Procedure Laterality Date   APPENDECTOMY     FEMUR IM NAIL Right 11/20/2017   Procedure: RIGHT FEMORAL INTRAMEDULLARY (IM) NAIL;  Surgeon: Yolonda Kida, MD;  Location: [facility] OR;  Service: Orthopedics;  Laterality: Right;   IR IMAGING GUIDED PORT INSERTION  08/19/2021    SOCIAL HISTORY: Social History    Socioeconomic History   Marital status: Single    Spouse name: Not on file   Number of children: Not on file   Years of education: Not on file   Highest education level: Not on file  Occupational History   Not on file  Tobacco Use   Smoking status: Every Day    Current packs/day: 0.00    Types: Cigarettes    Last attempt to quit: 11/09/2017    Years since quitting: 5.8   Smokeless tobacco: Never   Tobacco comments:    1 cigarette/day as of 09/25/18  Vaping Use   Vaping status: Never Used  Substance and Sexual Activity   Alcohol use: Yes   Drug use: No   Sexual activity: Not on file  Other Topics Concern   Not on file  Social History Narrative   Not on file   Social Determinants of Health   Financial Resource Strain: Not on file  Food Insecurity: Low Risk  (04/24/2023)   [facility]   Hunger Vital Sign    Worried About Running Out of Food in the Last Year: Never true    Ran Out of Food in the Last Year: Never true  Transportation Needs: Not on file (04/24/2023)  Physical Activity: Not on file  Stress: Not on file  Social Connections: Not on file  Intimate Partner Violence: Not on file    FAMILY HISTORY: Family History  Problem Relation Age of Onset   COPD Mother    Liver cancer Mother     ALLERGIES:  has No Known Allergies.  MEDICATIONS:  . Current Outpatient Medications on File Prior to Visit  Medication Sig Dispense Refill   acyclovir (ZOVIRAX) 400 MG tablet Take 1 tablet (400 mg total) by mouth daily. 90 tablet 6   Albuterol Sulfate (PROAIR RESPICLICK) 108 (90 Base) MCG/ACT AEPB Inhale into the lungs. (Patient not taking: Reported on 09/01/2022)     aspirin EC 81 MG tablet Take 1 tablet (81 mg total) by mouth daily. 60 tablet 11   azithromycin (ZITHROMAX) 250 MG tablet 2 tab (500mg ) for 1 days and then 1 tab (250mg ) daily for 4 days (Patient not taking: Reported on 11/24/2022) 6 each 0   cholecalciferol (VITAMIN D3) 10 MCG  (400 UNIT) TABS tablet Take 1 tablet (400 Units total) by mouth at bedtime. (Patient not taking: Reported on 11/24/2022) 30 tablet 3   ferrous sulfate 325 (65 FE) MG EC tablet TAKE 1 TABLET BY MOUTH EVERY DAY WITH BREAKFAST 90 tablet 1   FLOVENT DISKUS 250 MCG/ACT AEPB Inhale into the lungs. (Patient not taking: Reported on 09/01/2022)     ipratropium-albuterol (DUONEB) 0.5-2.5 (3) MG/3ML SOLN Inhale into the lungs. (Patient not taking: Reported on 09/01/2022)     lenalidomide (REVLIMID) 10 MG capsule Take 1 capsule (10 mg total) by mouth daily. Take 1 capsule (10 mg) by  mouth daily for 21 days and then take 7 days off. 21 capsule 0   lidocaine-prilocaine (EMLA) cream Apply 1 application topically as needed. Apply to Port-A-Cath at least 1 hr prior to treatment. 30 g 0   Multiple Vitamins-Minerals (MULTIVITAMIN WITH MINERALS) tablet Take 1 tablet by mouth at bedtime.     nicotine (NICODERM CQ - DOSED IN MG/24 HOURS) 14 mg/24hr patch PLACE 1 PATCH (14 MG TOTAL) ONTO THE SKIN DAILY. 28 patch 1   ondansetron (ZOFRAN) 4 MG tablet Take 2 tablets (8 mg total) by mouth every 8 (eight) hours as needed for nausea. (Patient not taking: Reported on 03/02/2023) 30 tablet 0   predniSONE (DELTASONE) 20 MG tablet Take 2 tablets (40 mg total) by mouth daily with breakfast. For the next four days (Patient not taking: Reported on 09/01/2022) 8 tablet 0   Vitamin D, Ergocalciferol, (DRISDOL) 1.25 MG (50000 UNIT) CAPS capsule Take 1 capsule (50,000 Units total) by mouth once a week. 12 capsule 6   Current Facility-Administered Medications on File Prior to Visit  Medication Dose Route Frequency Provider Last Rate Last Admin   acetaminophen (TYLENOL) 325 MG tablet            famotidine (PEPCID) 20 MG tablet             PHYSICAL EXAMINATION:  ECOG FS:1 - Symptomatic but completely ambulatory VSS Wt Readings from Last 3 Encounters:  09/14/23 160 lb 9.6 oz (72.8 kg)  08/17/23 163 lb (73.9 kg)  08/03/23 162 lb 4 oz (73.6  kg)    GENERAL:alert, in no acute distress and comfortable SKIN: no acute rashes, no significant lesions EYES: conjunctiva are pink and non-injected, sclera anicteric OROPHARYNX: MMM, no exudates, no oropharyngeal erythema or ulceration NECK: supple, no JVD LYMPH:  no palpable lymphadenopathy in the cervical, axillary or inguinal regions LUNGS: clear to auscultation b/l with normal respiratory effort HEART: regular rate & rhythm ABDOMEN:  normoactive bowel sounds , non tender, not distended. Extremity: no pedal edema PSYCH: alert & oriented x 3 with fluent speech NEURO: no focal motor/sensory deficits     LABORATORY DATA:  I have reviewed the data as listed     Latest Ref Rng & Units 09/14/2023    8:02 AM 08/31/2023    8:23 AM 08/17/2023    8:40 AM  CBC  WBC 4.0 - 10.5 K/uL 7.6  6.1  7.7   Hemoglobin 13.0 - 17.0 g/dL 01.0  27.2  53.6   Hematocrit 39.0 - 52.0 % 38.1  39.8  38.3   Platelets 150 - 400 K/uL 270  289  209    CBC    Component Value Date/Time   WBC 7.6 09/14/2023 0802   WBC 10.1 08/23/2022 0835   RBC 4.17 (L) 09/14/2023 0802   HGB 13.1 09/14/2023 0802   HGB 8.3 (L) 12/14/2017 1230   HCT 38.1 (L) 09/14/2023 0802   HCT 23.9 (L) 12/14/2017 1230   PLT 270 09/14/2023 0802   PLT 524 (H) 12/14/2017 1230   MCV 91.4 09/14/2023 0802   MCV 88.4 12/14/2017 1230   MCH 31.4 09/14/2023 0802   MCHC 34.4 09/14/2023 0802   RDW 15.1 09/14/2023 0802   RDW 15.3 (H) 12/14/2017 1230   LYMPHSABS 2.4 09/14/2023 0802   LYMPHSABS 1.4 12/14/2017 1230   MONOABS 1.3 (H) 09/14/2023 0802   MONOABS 0.6 12/14/2017 1230   EOSABS 0.3 09/14/2023 0802   EOSABS 0.1 12/14/2017 1230   BASOSABS 0.1 09/14/2023 0802   BASOSABS  0.1 12/14/2017 1230        Latest Ref Rng & Units 09/14/2023    8:02 AM 08/31/2023    8:23 AM 08/17/2023    8:40 AM  CMP  Glucose 70 - 99 mg/dL 86  87  564   BUN 8 - 23 mg/dL 20  25  27    Creatinine 0.61 - 1.24 mg/dL 3.32  9.51  8.84   Sodium 135 - 145 mmol/L 140   143  139   Potassium 3.5 - 5.1 mmol/L 3.9  4.2  4.1   Chloride 98 - 111 mmol/L 109  111  108   CO2 22 - 32 mmol/L 23  25  25    Calcium 8.9 - 10.3 mg/dL 8.9  9.2  8.9   Total Protein 6.5 - 8.1 g/dL 6.8  7.1  6.9   Total Bilirubin 0.3 - 1.2 mg/dL 0.6  0.5  0.6   Alkaline Phos 38 - 126 U/L 73  77  69   AST 15 - 41 U/L 10  14  10    ALT 0 - 44 U/L 10  12  10       03/09/20 ([id number]) Bone Marrow Biopsy at [facility]   07/02/2019 Bone Marrow Biopsy ([id number]) at [facility]    08/07/18 BM Bx:        RADIOGRAPHIC STUDIES: I have personally reviewed the radiological images as listed and agreed with the findings in the report. No results found.  ASSESSMENT & PLAN:    67 y.o. 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   Initial BM Bx showed >95% involvement with kappa restricted serum free light chains. 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 MoL Cy-   08/07/18 BM Bx revealed normocellular bone marrow with trilineage hematopoiesis and 1% plasma cells which indicates the pt is in remission 08/06/18 PET/CT revealed  Expansile lytic lesion involving the left iliac bone, unchanged. Lytic lesion involving the sternum, unchanged. While both are suspicious for myeloma, neither lesion is FDG avid. No hypermetabolic osseous lesions in the visualized axial and appendicular skeleton. Please note that the bilateral lower extremities were not imaged. No suspicious lymphadenopathy. Spleen is normal in size.    Pt began Ninlaro 3mg  maintenance after pt finished 13 cycles of CyBorD and BM bx showed only 1% plasma cells, then resumed CyBorD and held Ninlaro in anticipation of his tandem bone  marrow transplants beginning in late December 2019 with Dr. Shara Blazing at [facility]   11/19/18 High dose chemotherapy with Melphalan 140mg /m2 and autologous stem cell rescue  03/20/19 High dose chemotherapy with Melphalan 200mg /m2 and autologous stem cell rescue. Non-infectious diarrhea, uncomplicated course otherwise.  07/02/2019 PET scan with results revealing "1. No FDG avid bone lesions are identified. 2.  Asymmetric uptake within the right, greater than left, palatine tonsils with mild fullness of the right tonsillar soft tissue. ENT consult vs attention on follow up suggested. 3.  Innumerable mixed sclerotic and lytic lesions throughout the axial and appendicular skeleton without associated hypermetabolic activity. 4.  Ancillary CT findings as above."  07/02/2019 bone marrow biopsy ([id number])with results showing: "Trilineage hematopoieses with maturation. No plasma cell myeloma identified. Mild anemia and No Rouleaux or circulating plasma cells identified in the  peripheral blood."  2) s/p Prophylactic IM nailing for rt femur  completed [facility].  -patient previously reported improvement and is currently progressively back to full time work.  3) Thrombocytopenia resolved   4) Renal insufficiency --likely related to multiple myeloma.  Remained fairly stable with baseline creatinine of 1.5-2.1.  Creatinine is 1.44 today Plan -maintain follow up with nephrology referral to Dr Arrie [facility] for continued treatment -Pt completed 8 weeks of maverick for hep c with Dr Luciana Axe on 07/08/18.  -Kidney numbers improved. Creatinine in the 1.7-2.2 range   5) h/o tobacco abuse-counseled on smoking cessation -Currently not smoking.   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 Hemoglobin is stable at 12.8  PLAN:  -Discussed lab results on 08/03/2023 in detail with patient. CBC  normal,  showed WBC of 6.9K, hemoglobin of 13.8, and platelets of 236K. -CMP shows stable kidney function -patient is nearly 6 years into remission at this time -last myeloma panel from 4 weeks ago showed that M protein was not observed and patient continued to be in remission -Patient has no lab or clinical evidence of progression of his plasma cell leukemia/multiple myeloma  -patient has no new or severe toxicities from his last treatment -will stop Aredia -continue Revlimid maintainence -continue maintenance Carfilzomib every two weeks -will refill acyclovir -recommend patient to stay UTD with age-appropriate vaccinations including influenza and newest COVID-19 booster injection as soon as possible -continue ASA  FOLLOW-UP: ***  The total time spent in the appointment was *** minutes* .  All of the patient's questions were answered with apparent satisfaction. The patient knows to call the clinic with any problems, questions or concerns.   Wyvonnia Lora MD MS AAHIVMS [facility] Hematology/Oncology Physician [facility]  .*Total Encounter Time as defined by the Centers for Medicare and Medicaid Services includes, in addition to the face-to-face time of a patient visit (documented in the note above) non-face-to-face time: obtaining and reviewing outside history, ordering and reviewing medications, tests or procedures, care coordination (communications with other health care professionals or caregivers) and documentation in the medical record.    I,Mitra Faeizi,acting as a Neurosurgeon for Wyvonnia Lora, MD.,have documented all relevant documentation on the behalf of Wyvonnia Lora, MD,as directed by  Wyvonnia Lora, MD while in the presence of Wyvonnia Lora, MD.  ***

## 2023-09-28 ENCOUNTER — Inpatient Hospital Stay: Payer: Medicare Other

## 2023-09-28 ENCOUNTER — Ambulatory Visit: Payer: Medicare Other | Admitting: Hematology

## 2023-09-28 ENCOUNTER — Other Ambulatory Visit: Payer: Medicare Other

## 2023-09-28 ENCOUNTER — Inpatient Hospital Stay: Payer: Medicare Other | Admitting: Hematology

## 2023-09-28 ENCOUNTER — Other Ambulatory Visit: Payer: Self-pay | Admitting: Hematology

## 2023-09-28 ENCOUNTER — Ambulatory Visit: Payer: Medicare Other

## 2023-09-28 DIAGNOSIS — C9000 Multiple myeloma not having achieved remission: Secondary | ICD-10-CM

## 2023-09-28 DIAGNOSIS — Z7189 Other specified counseling: Secondary | ICD-10-CM | POA: Diagnosis not present

## 2023-09-28 DIAGNOSIS — C9010 Plasma cell leukemia not having achieved remission: Secondary | ICD-10-CM | POA: Diagnosis not present

## 2023-09-28 DIAGNOSIS — Z5111 Encounter for antineoplastic chemotherapy: Secondary | ICD-10-CM | POA: Diagnosis not present

## 2023-09-28 LAB — CBC WITH DIFFERENTIAL (CANCER CENTER ONLY)
Abs Immature Granulocytes: 0.04 10*3/uL (ref 0.00–0.07)
Basophils Absolute: 0.1 10*3/uL (ref 0.0–0.1)
Basophils Relative: 2 %
Eosinophils Absolute: 0.2 10*3/uL (ref 0.0–0.5)
Eosinophils Relative: 3 %
HCT: 39 % (ref 39.0–52.0)
Hemoglobin: 13.7 g/dL (ref 13.0–17.0)
Immature Granulocytes: 1 %
Lymphocytes Relative: 27 %
Lymphs Abs: 1.8 10*3/uL (ref 0.7–4.0)
MCH: 31.9 pg (ref 26.0–34.0)
MCHC: 35.1 g/dL (ref 30.0–36.0)
MCV: 90.9 fL (ref 80.0–100.0)
Monocytes Absolute: 0.9 10*3/uL (ref 0.1–1.0)
Monocytes Relative: 14 %
Neutro Abs: 3.7 10*3/uL (ref 1.7–7.7)
Neutrophils Relative %: 53 %
Platelet Count: 283 10*3/uL (ref 150–400)
RBC: 4.29 MIL/uL (ref 4.22–5.81)
RDW: 15.2 % (ref 11.5–15.5)
WBC Count: 6.9 10*3/uL (ref 4.0–10.5)
nRBC: 0 % (ref 0.0–0.2)

## 2023-09-28 LAB — CMP (CANCER CENTER ONLY)
ALT: 11 U/L (ref 0–44)
AST: 12 U/L — ABNORMAL LOW (ref 15–41)
Albumin: 4.2 g/dL (ref 3.5–5.0)
Alkaline Phosphatase: 68 U/L (ref 38–126)
Anion gap: 7 (ref 5–15)
BUN: 27 mg/dL — ABNORMAL HIGH (ref 8–23)
CO2: 25 mmol/L (ref 22–32)
Calcium: 9.7 mg/dL (ref 8.9–10.3)
Chloride: 109 mmol/L (ref 98–111)
Creatinine: 1.71 mg/dL — ABNORMAL HIGH (ref 0.61–1.24)
GFR, Estimated: 44 mL/min — ABNORMAL LOW (ref >60)
Glucose, Bld: 105 mg/dL — ABNORMAL HIGH (ref 70–99)
Potassium: 4.1 mmol/L (ref 3.5–5.1)
Sodium: 141 mmol/L (ref 135–145)
Total Bilirubin: 0.6 mg/dL (ref 0.3–1.2)
Total Protein: 7 g/dL (ref 6.5–8.1)

## 2023-09-28 MED ORDER — NICOTINE 14 MG/24HR TD PT24: MEDICATED_PATCH | TRANSDERMAL | 1 refills | Status: DC

## 2023-09-28 MED ORDER — VITAMIN D (ERGOCALCIFEROL) 1.25 MG (50000 UNIT) PO CAPS: 50000.0000 [IU] | ORAL_CAPSULE | ORAL | 6 refills | Status: DC

## 2023-09-28 MED ORDER — FLOVENT DISKUS 250 MCG/ACT IN AEPB: 1.0000 | INHALATION_SPRAY | Freq: Every day | RESPIRATORY_TRACT | 1 refills | Status: DC

## 2023-09-28 MED ORDER — ALBUTEROL SULFATE 108 (90 BASE) MCG/ACT IN AEPB: 1.0000 | INHALATION_SPRAY | Freq: Four times a day (QID) | RESPIRATORY_TRACT | 1 refills | Status: DC | PRN

## 2023-09-28 NOTE — Progress Notes (Signed)
Patient seen by Dr. Addison Naegeli are within treatment parameters. BP 158/98 MD aware will recheck  Labs reviewed: and are not all within treatment parameters. Dr Candise Che aware CR: 1.71  Per physician team, patient will not be receiving treatment today.

## 2023-10-04 ENCOUNTER — Encounter: Payer: Self-pay | Admitting: Hematology

## 2023-10-11 ENCOUNTER — Inpatient Hospital Stay: Payer: Medicare Other

## 2023-10-12 ENCOUNTER — Other Ambulatory Visit: Payer: Self-pay | Admitting: Hematology

## 2023-10-12 ENCOUNTER — Other Ambulatory Visit: Payer: Self-pay

## 2023-10-12 ENCOUNTER — Inpatient Hospital Stay: Payer: Medicare Other | Attending: Hematology

## 2023-10-12 ENCOUNTER — Inpatient Hospital Stay: Payer: Medicare Other

## 2023-10-12 VITALS — BP 125/83 | HR 80 | Temp 98.2°F | Resp 16

## 2023-10-12 DIAGNOSIS — Z7189 Other specified counseling: Secondary | ICD-10-CM

## 2023-10-12 DIAGNOSIS — C9001 Multiple myeloma in remission: Secondary | ICD-10-CM | POA: Diagnosis not present

## 2023-10-12 DIAGNOSIS — C9010 Plasma cell leukemia not having achieved remission: Secondary | ICD-10-CM

## 2023-10-12 DIAGNOSIS — C9011 Plasma cell leukemia in remission: Secondary | ICD-10-CM | POA: Diagnosis not present

## 2023-10-12 DIAGNOSIS — Z5111 Encounter for antineoplastic chemotherapy: Secondary | ICD-10-CM | POA: Insufficient documentation

## 2023-10-12 LAB — CBC WITH DIFFERENTIAL (CANCER CENTER ONLY)
Abs Immature Granulocytes: 0.02 10*3/uL (ref 0.00–0.07)
Basophils Absolute: 0.1 10*3/uL (ref 0.0–0.1)
Basophils Relative: 1 %
Eosinophils Absolute: 0.3 10*3/uL (ref 0.0–0.5)
Eosinophils Relative: 5 %
HCT: 39.7 % (ref 39.0–52.0)
Hemoglobin: 13.4 g/dL (ref 13.0–17.0)
Immature Granulocytes: 0 %
Lymphocytes Relative: 25 %
Lymphs Abs: 1.6 10*3/uL (ref 0.7–4.0)
MCH: 31.2 pg (ref 26.0–34.0)
MCHC: 33.8 g/dL (ref 30.0–36.0)
MCV: 92.3 fL (ref 80.0–100.0)
Monocytes Absolute: 1 10*3/uL (ref 0.1–1.0)
Monocytes Relative: 15 %
Neutro Abs: 3.4 10*3/uL (ref 1.7–7.7)
Neutrophils Relative %: 54 %
Platelet Count: 238 10*3/uL (ref 150–400)
RBC: 4.3 MIL/uL (ref 4.22–5.81)
RDW: 15 % (ref 11.5–15.5)
WBC Count: 6.3 10*3/uL (ref 4.0–10.5)
nRBC: 0 % (ref 0.0–0.2)

## 2023-10-12 LAB — CMP (CANCER CENTER ONLY)
ALT: 10 U/L (ref 0–44)
AST: 11 U/L — ABNORMAL LOW (ref 15–41)
Albumin: 4.1 g/dL (ref 3.5–5.0)
Alkaline Phosphatase: 68 U/L (ref 38–126)
Anion gap: 7 (ref 5–15)
BUN: 20 mg/dL (ref 8–23)
CO2: 25 mmol/L (ref 22–32)
Calcium: 8.8 mg/dL — ABNORMAL LOW (ref 8.9–10.3)
Chloride: 109 mmol/L (ref 98–111)
Creatinine: 1.54 mg/dL — ABNORMAL HIGH (ref 0.61–1.24)
GFR, Estimated: 49 mL/min — ABNORMAL LOW (ref >60)
Glucose, Bld: 107 mg/dL — ABNORMAL HIGH (ref 70–99)
Potassium: 3.8 mmol/L (ref 3.5–5.1)
Sodium: 141 mmol/L (ref 135–145)
Total Bilirubin: 0.5 mg/dL (ref 0.3–1.2)
Total Protein: 7.1 g/dL (ref 6.5–8.1)

## 2023-10-12 MED ORDER — HEPARIN SOD (PORK) LOCK FLUSH 100 UNIT/ML IV SOLN
500.0000 [IU] | Freq: Once | INTRAVENOUS | Status: AC | PRN
  Administered 2023-10-12: 500 [IU]

## 2023-10-12 MED ORDER — ACETAMINOPHEN 325 MG PO TABS
650.0000 mg | ORAL_TABLET | Freq: Once | ORAL | Status: AC
Start: 2023-10-12 — End: 2023-10-12
  Administered 2023-10-12: 650 mg via ORAL
  Filled 2023-10-12: qty 2

## 2023-10-12 MED ORDER — SODIUM CHLORIDE 0.9% FLUSH
10.0000 mL | INTRAVENOUS | Status: DC | PRN
  Administered 2023-10-12: 10 mL

## 2023-10-12 MED ORDER — DEXTROSE 5 % IV SOLN
56.0000 mg/m2 | Freq: Once | INTRAVENOUS | Status: AC
  Administered 2023-10-12: 100 mg via INTRAVENOUS
  Filled 2023-10-12: qty 30

## 2023-10-12 MED ORDER — LENALIDOMIDE 10 MG PO CAPS: 10.0000 mg | ORAL_CAPSULE | Freq: Every day | ORAL | 0 refills | Status: DC

## 2023-10-12 MED ORDER — SODIUM CHLORIDE 0.9 % IV SOLN: Freq: Once | INTRAVENOUS | Status: AC

## 2023-10-12 MED ORDER — DEXAMETHASONE SODIUM PHOSPHATE 10 MG/ML IJ SOLN
6.0000 mg | Freq: Once | INTRAMUSCULAR | Status: AC
  Administered 2023-10-12: 6 mg via INTRAVENOUS
  Filled 2023-10-12: qty 1

## 2023-10-12 MED ORDER — SODIUM CHLORIDE 0.9 % IV SOLN
Freq: Once | INTRAVENOUS | Status: AC
Start: 2023-10-12 — End: 2023-10-12

## 2023-10-12 MED ORDER — FAMOTIDINE IN NACL 20-0.9 MG/50ML-% IV SOLN
20.0000 mg | Freq: Once | INTRAVENOUS | Status: AC
  Administered 2023-10-12: 20 mg via INTRAVENOUS
  Filled 2023-10-12: qty 50

## 2023-10-12 NOTE — Patient Instructions (Signed)
[place] [facility]  Discharge Instructions: Thank you for choosing [facility] to provide your oncology and hematology care.   If you have a lab appointment with the Cancer Center, please go directly to the Cancer Center and check in at the registration area.   Wear comfortable clothing and clothing appropriate for easy access to any Portacath or PICC line.   We strive to give you quality time with your provider. You may need to reschedule your appointment if you arrive late (15 or more minutes).  Arriving late affects you and other patients whose appointments are after yours.  Also, if you miss three or more appointments without notifying the office, you may be dismissed from the clinic at the provider's discretion.      For prescription refill requests, have your pharmacy contact our office and allow 72 hours for refills to be completed.    Today you received the following chemotherapy and/or immunotherapy agents :  Kyprolis   To help prevent nausea and vomiting after your treatment, we encourage you to take your nausea medication as directed.  BELOW ARE SYMPTOMS THAT SHOULD BE REPORTED IMMEDIATELY: *FEVER GREATER THAN 100.4 F (38 C) OR HIGHER *CHILLS OR SWEATING *NAUSEA AND VOMITING THAT IS NOT CONTROLLED WITH YOUR NAUSEA MEDICATION *UNUSUAL SHORTNESS OF BREATH *UNUSUAL BRUISING OR BLEEDING *URINARY PROBLEMS (pain or burning when urinating, or frequent urination) *BOWEL PROBLEMS (unusual diarrhea, constipation, pain near the anus) TENDERNESS IN MOUTH AND THROAT WITH OR WITHOUT PRESENCE OF ULCERS (sore throat, sores in mouth, or a toothache) UNUSUAL RASH, SWELLING OR PAIN  UNUSUAL VAGINAL DISCHARGE OR ITCHING   Items with * indicate a potential emergency and should be followed up as soon as possible or go to the Emergency Department if any problems should occur.  Please show the CHEMOTHERAPY ALERT CARD or IMMUNOTHERAPY ALERT CARD at  check-in to the Emergency Department and triage nurse.  Should you have questions after your visit or need to cancel or reschedule your appointment, please contact [place] [facility]  Dept: [phone]  and follow the prompts.  Office hours are 8:00 a.m. to 4:30 p.m. Monday - Friday. Please note that voicemails left after 4:00 p.m. may not be returned until the following business day.  We are closed weekends and major holidays. You have access to a nurse at all times for urgent questions. Please call the main number to the clinic Dept: [phone] and follow the prompts.   For any non-urgent questions, you may also contact your provider using MyChart. We now offer e-Visits for anyone 18 and older to request care online for non-urgent symptoms. For details visit [email or web address].   Also download the MyChart app! Go to the app store, search "MyChart", open the app, select [facility], and log in with your MyChart username and password.   

## 2023-10-12 NOTE — Progress Notes (Signed)
Ok to treat with creat 1.54 mg/dL per Dr Candise Che

## 2023-10-15 LAB — KAPPA/LAMBDA LIGHT CHAINS
Kappa free light chain: 23.3 mg/L — ABNORMAL HIGH (ref 3.3–19.4)
Kappa, lambda light chain ratio: 1.18 (ref 0.26–1.65)
Lambda free light chains: 19.8 mg/L (ref 5.7–26.3)

## 2023-10-25 ENCOUNTER — Other Ambulatory Visit: Payer: Self-pay

## 2023-10-26 ENCOUNTER — Inpatient Hospital Stay: Payer: Medicare Other

## 2023-10-26 VITALS — BP 131/95 | HR 88 | Temp 98.2°F | Resp 18 | Ht 68.0 in | Wt 164.2 lb

## 2023-10-26 DIAGNOSIS — Z7189 Other specified counseling: Secondary | ICD-10-CM

## 2023-10-26 DIAGNOSIS — Z5111 Encounter for antineoplastic chemotherapy: Secondary | ICD-10-CM | POA: Diagnosis not present

## 2023-10-26 DIAGNOSIS — C9000 Multiple myeloma not having achieved remission: Secondary | ICD-10-CM

## 2023-10-26 DIAGNOSIS — M84551A Pathological fracture in neoplastic disease, right femur, initial encounter for fracture: Secondary | ICD-10-CM

## 2023-10-26 DIAGNOSIS — Z95828 Presence of other vascular implants and grafts: Secondary | ICD-10-CM

## 2023-10-26 DIAGNOSIS — C9010 Plasma cell leukemia not having achieved remission: Secondary | ICD-10-CM

## 2023-10-26 LAB — CBC WITH DIFFERENTIAL (CANCER CENTER ONLY)
Abs Immature Granulocytes: 0.03 10*3/uL (ref 0.00–0.07)
Basophils Absolute: 0.2 10*3/uL — ABNORMAL HIGH (ref 0.0–0.1)
Basophils Relative: 2 %
Eosinophils Absolute: 0.5 10*3/uL (ref 0.0–0.5)
Eosinophils Relative: 7 %
HCT: 39.3 % (ref 39.0–52.0)
Hemoglobin: 13.2 g/dL (ref 13.0–17.0)
Immature Granulocytes: 0 %
Lymphocytes Relative: 30 %
Lymphs Abs: 2.2 10*3/uL (ref 0.7–4.0)
MCH: 31.1 pg (ref 26.0–34.0)
MCHC: 33.6 g/dL (ref 30.0–36.0)
MCV: 92.5 fL (ref 80.0–100.0)
Monocytes Absolute: 0.8 10*3/uL (ref 0.1–1.0)
Monocytes Relative: 11 %
Neutro Abs: 3.6 10*3/uL (ref 1.7–7.7)
Neutrophils Relative %: 50 %
Platelet Count: 293 10*3/uL (ref 150–400)
RBC: 4.25 MIL/uL (ref 4.22–5.81)
RDW: 14.6 % (ref 11.5–15.5)
WBC Count: 7.3 10*3/uL (ref 4.0–10.5)
nRBC: 0 % (ref 0.0–0.2)

## 2023-10-26 LAB — CMP (CANCER CENTER ONLY)
ALT: 9 U/L (ref 0–44)
AST: 10 U/L — ABNORMAL LOW (ref 15–41)
Albumin: 4 g/dL (ref 3.5–5.0)
Alkaline Phosphatase: 77 U/L (ref 38–126)
Anion gap: 6 (ref 5–15)
BUN: 23 mg/dL (ref 8–23)
CO2: 26 mmol/L (ref 22–32)
Calcium: 9.3 mg/dL (ref 8.9–10.3)
Chloride: 108 mmol/L (ref 98–111)
Creatinine: 1.41 mg/dL — ABNORMAL HIGH (ref 0.61–1.24)
GFR, Estimated: 55 mL/min — ABNORMAL LOW (ref >60)
Glucose, Bld: 95 mg/dL (ref 70–99)
Potassium: 4.2 mmol/L (ref 3.5–5.1)
Sodium: 140 mmol/L (ref 135–145)
Total Bilirubin: 0.5 mg/dL (ref <1.2)
Total Protein: 7.2 g/dL (ref 6.5–8.1)

## 2023-10-26 MED ORDER — DEXTROSE 5 % IV SOLN
56.0000 mg/m2 | Freq: Once | INTRAVENOUS | Status: AC
  Administered 2023-10-26: 100 mg via INTRAVENOUS
  Filled 2023-10-26: qty 30

## 2023-10-26 MED ORDER — ACETAMINOPHEN 325 MG PO TABS
650.0000 mg | ORAL_TABLET | Freq: Once | ORAL | Status: AC
Start: 2023-10-26 — End: 2023-10-26
  Administered 2023-10-26: 650 mg via ORAL
  Filled 2023-10-26: qty 2

## 2023-10-26 MED ORDER — SODIUM CHLORIDE 0.9% FLUSH
10.0000 mL | INTRAVENOUS | Status: DC | PRN
  Administered 2023-10-26: 10 mL

## 2023-10-26 MED ORDER — DEXAMETHASONE SODIUM PHOSPHATE 10 MG/ML IJ SOLN
6.0000 mg | Freq: Once | INTRAMUSCULAR | Status: AC
  Administered 2023-10-26: 6 mg via INTRAVENOUS
  Filled 2023-10-26: qty 1

## 2023-10-26 MED ORDER — HEPARIN SOD (PORK) LOCK FLUSH 100 UNIT/ML IV SOLN
500.0000 [IU] | Freq: Once | INTRAVENOUS | Status: AC | PRN
  Administered 2023-10-26: 500 [IU]

## 2023-10-26 MED ORDER — FAMOTIDINE IN NACL 20-0.9 MG/50ML-% IV SOLN
20.0000 mg | Freq: Once | INTRAVENOUS | Status: AC
  Administered 2023-10-26: 20 mg via INTRAVENOUS
  Filled 2023-10-26: qty 50

## 2023-10-26 MED ORDER — SODIUM CHLORIDE 0.9% FLUSH
10.0000 mL | Freq: Once | INTRAVENOUS | Status: AC
  Administered 2023-10-26: 10 mL

## 2023-10-26 MED ORDER — SODIUM CHLORIDE 0.9 % IV SOLN: Freq: Once | INTRAVENOUS | Status: AC

## 2023-10-26 NOTE — Patient Instructions (Signed)
 [facility] - A DEPT OF [facility]  Discharge Instructions: Thank you for choosing [facility] to provide your oncology and hematology care.   If you have a lab appointment with the Cancer Center, please go directly to the Cancer Center and check in at the registration area.   Wear comfortable clothing and clothing appropriate for easy access to any Portacath or PICC line.   We strive to give you quality time with your provider. You may need to reschedule your appointment if you arrive late (15 or more minutes).  Arriving late affects you and other patients whose appointments are after yours.  Also, if you miss three or more appointments without notifying the office, you may be dismissed from the clinic at the provider's discretion.      For prescription refill requests, have your pharmacy contact our office and allow 72 hours for refills to be completed.    Today you received the following chemotherapy and/or immunotherapy agents: Kyprolis      To help prevent nausea and vomiting after your treatment, we encourage you to take your nausea medication as directed.  BELOW ARE SYMPTOMS THAT SHOULD BE REPORTED IMMEDIATELY: *FEVER GREATER THAN 100.4 F (38 C) OR HIGHER *CHILLS OR SWEATING *NAUSEA AND VOMITING THAT IS NOT CONTROLLED WITH YOUR NAUSEA MEDICATION *UNUSUAL SHORTNESS OF BREATH *UNUSUAL BRUISING OR BLEEDING *URINARY PROBLEMS (pain or burning when urinating, or frequent urination) *BOWEL PROBLEMS (unusual diarrhea, constipation, pain near the anus) TENDERNESS IN MOUTH AND THROAT WITH OR WITHOUT PRESENCE OF ULCERS (sore throat, sores in mouth, or a toothache) UNUSUAL RASH, SWELLING OR PAIN  UNUSUAL VAGINAL DISCHARGE OR ITCHING   Items with * indicate a potential emergency and should be followed up as soon as possible or go to the Emergency Department if any problems should occur.  Please show the CHEMOTHERAPY ALERT CARD or IMMUNOTHERAPY  ALERT CARD at check-in to the Emergency Department and triage nurse.  Should you have questions after your visit or need to cancel or reschedule your appointment, please contact [facility] - A DEPT OF [facility]  Dept: [phone]  and follow the prompts.  Office hours are 8:00 a.m. to 4:30 p.m. Monday - Friday. Please note that voicemails left after 4:00 p.m. may not be returned until the following business day.  We are closed weekends and major holidays. You have access to a nurse at all times for urgent questions. Please call the main number to the clinic Dept: [phone] and follow the prompts.   For any non-urgent questions, you may also contact your provider using MyChart. We now offer e-Visits for anyone 38 and older to request care online for non-urgent symptoms. For details visit [email or web address].   Also download the MyChart app! Go to the app store, search "MyChart", open the app, select [place], and log in with your MyChart username and password.

## 2023-11-06 ENCOUNTER — Other Ambulatory Visit: Payer: Self-pay

## 2023-11-07 NOTE — Progress Notes (Signed)
Per Dr Candise Che pt okay for TX with elevated Creatinine as long as it is less than 2.0

## 2023-11-09 ENCOUNTER — Inpatient Hospital Stay: Payer: Medicare Other

## 2023-11-09 VITALS — BP 149/95 | HR 81 | Temp 98.3°F | Resp 18 | Wt 164.2 lb

## 2023-11-09 DIAGNOSIS — Z7189 Other specified counseling: Secondary | ICD-10-CM

## 2023-11-09 DIAGNOSIS — C9010 Plasma cell leukemia not having achieved remission: Secondary | ICD-10-CM

## 2023-11-09 DIAGNOSIS — Z95828 Presence of other vascular implants and grafts: Secondary | ICD-10-CM

## 2023-11-09 DIAGNOSIS — Z5111 Encounter for antineoplastic chemotherapy: Secondary | ICD-10-CM | POA: Diagnosis not present

## 2023-11-09 DIAGNOSIS — M84551A Pathological fracture in neoplastic disease, right femur, initial encounter for fracture: Secondary | ICD-10-CM

## 2023-11-09 LAB — CBC WITH DIFFERENTIAL (CANCER CENTER ONLY)
Abs Immature Granulocytes: 0.02 10*3/uL (ref 0.00–0.07)
Basophils Absolute: 0.1 10*3/uL (ref 0.0–0.1)
Basophils Relative: 1 %
Eosinophils Absolute: 0.2 10*3/uL (ref 0.0–0.5)
Eosinophils Relative: 3 %
HCT: 40.1 % (ref 39.0–52.0)
Hemoglobin: 13.7 g/dL (ref 13.0–17.0)
Immature Granulocytes: 0 %
Lymphocytes Relative: 29 %
Lymphs Abs: 2.2 10*3/uL (ref 0.7–4.0)
MCH: 31.4 pg (ref 26.0–34.0)
MCHC: 34.2 g/dL (ref 30.0–36.0)
MCV: 91.8 fL (ref 80.0–100.0)
Monocytes Absolute: 0.9 10*3/uL (ref 0.1–1.0)
Monocytes Relative: 13 %
Neutro Abs: 3.9 10*3/uL (ref 1.7–7.7)
Neutrophils Relative %: 54 %
Platelet Count: 221 10*3/uL (ref 150–400)
RBC: 4.37 MIL/uL (ref 4.22–5.81)
RDW: 14.6 % (ref 11.5–15.5)
WBC Count: 7.3 10*3/uL (ref 4.0–10.5)
nRBC: 0 % (ref 0.0–0.2)

## 2023-11-09 LAB — CMP (CANCER CENTER ONLY)
ALT: 10 U/L (ref 0–44)
AST: 12 U/L — ABNORMAL LOW (ref 15–41)
Albumin: 4.1 g/dL (ref 3.5–5.0)
Alkaline Phosphatase: 75 U/L (ref 38–126)
Anion gap: 9 (ref 5–15)
BUN: 23 mg/dL (ref 8–23)
CO2: 24 mmol/L (ref 22–32)
Calcium: 9.2 mg/dL (ref 8.9–10.3)
Chloride: 108 mmol/L (ref 98–111)
Creatinine: 1.46 mg/dL — ABNORMAL HIGH (ref 0.61–1.24)
GFR, Estimated: 53 mL/min — ABNORMAL LOW (ref >60)
Glucose, Bld: 109 mg/dL — ABNORMAL HIGH (ref 70–99)
Potassium: 4 mmol/L (ref 3.5–5.1)
Sodium: 141 mmol/L (ref 135–145)
Total Bilirubin: 0.5 mg/dL (ref <1.2)
Total Protein: 7 g/dL (ref 6.5–8.1)

## 2023-11-09 MED ORDER — FAMOTIDINE IN NACL 20-0.9 MG/50ML-% IV SOLN
20.0000 mg | Freq: Once | INTRAVENOUS | Status: AC
  Administered 2023-11-09: 20 mg via INTRAVENOUS
  Filled 2023-11-09: qty 50

## 2023-11-09 MED ORDER — ACETAMINOPHEN 325 MG PO TABS
650.0000 mg | ORAL_TABLET | Freq: Once | ORAL | Status: AC
  Administered 2023-11-09: 650 mg via ORAL
  Filled 2023-11-09: qty 2

## 2023-11-09 MED ORDER — DEXAMETHASONE SODIUM PHOSPHATE 10 MG/ML IJ SOLN
6.0000 mg | Freq: Once | INTRAMUSCULAR | Status: AC
  Administered 2023-11-09: 6 mg via INTRAVENOUS
  Filled 2023-11-09: qty 1

## 2023-11-09 MED ORDER — SODIUM CHLORIDE 0.9% FLUSH
10.0000 mL | Freq: Once | INTRAVENOUS | Status: AC
  Administered 2023-11-09: 10 mL

## 2023-11-09 MED ORDER — DEXTROSE 5 % IV SOLN
56.0000 mg/m2 | Freq: Once | INTRAVENOUS | Status: AC
  Administered 2023-11-09: 100 mg via INTRAVENOUS
  Filled 2023-11-09: qty 30

## 2023-11-09 MED ORDER — SODIUM CHLORIDE 0.9 % IV SOLN: Freq: Once | INTRAVENOUS | Status: AC

## 2023-11-09 NOTE — Patient Instructions (Signed)
 [facility] - A DEPT OF [facility]  Discharge Instructions: Thank you for choosing [facility] to provide your oncology and hematology care.   If you have a lab appointment with the Cancer Center, please go directly to the Cancer Center and check in at the registration area.   Wear comfortable clothing and clothing appropriate for easy access to any Portacath or PICC line.   We strive to give you quality time with your provider. You may need to reschedule your appointment if you arrive late (15 or more minutes).  Arriving late affects you and other patients whose appointments are after yours.  Also, if you miss three or more appointments without notifying the office, you may be dismissed from the clinic at the provider's discretion.      For prescription refill requests, have your pharmacy contact our office and allow 72 hours for refills to be completed.    Today you received the following chemotherapy and/or immunotherapy agents: Kyprolis      To help prevent nausea and vomiting after your treatment, we encourage you to take your nausea medication as directed.  BELOW ARE SYMPTOMS THAT SHOULD BE REPORTED IMMEDIATELY: *FEVER GREATER THAN 100.4 F (38 C) OR HIGHER *CHILLS OR SWEATING *NAUSEA AND VOMITING THAT IS NOT CONTROLLED WITH YOUR NAUSEA MEDICATION *UNUSUAL SHORTNESS OF BREATH *UNUSUAL BRUISING OR BLEEDING *URINARY PROBLEMS (pain or burning when urinating, or frequent urination) *BOWEL PROBLEMS (unusual diarrhea, constipation, pain near the anus) TENDERNESS IN MOUTH AND THROAT WITH OR WITHOUT PRESENCE OF ULCERS (sore throat, sores in mouth, or a toothache) UNUSUAL RASH, SWELLING OR PAIN  UNUSUAL VAGINAL DISCHARGE OR ITCHING   Items with * indicate a potential emergency and should be followed up as soon as possible or go to the Emergency Department if any problems should occur.  Please show the CHEMOTHERAPY ALERT CARD or IMMUNOTHERAPY  ALERT CARD at check-in to the Emergency Department and triage nurse.  Should you have questions after your visit or need to cancel or reschedule your appointment, please contact [facility] - A DEPT OF [facility]  Dept: [phone]  and follow the prompts.  Office hours are 8:00 a.m. to 4:30 p.m. Monday - Friday. Please note that voicemails left after 4:00 p.m. may not be returned until the following business day.  We are closed weekends and major holidays. You have access to a nurse at all times for urgent questions. Please call the main number to the clinic Dept: [phone] and follow the prompts.   For any non-urgent questions, you may also contact your provider using MyChart. We now offer e-Visits for anyone 38 and older to request care online for non-urgent symptoms. For details visit [email or web address].   Also download the MyChart app! Go to the app store, search "MyChart", open the app, select [place], and log in with your MyChart username and password.

## 2023-11-12 ENCOUNTER — Other Ambulatory Visit: Payer: Self-pay

## 2023-11-12 DIAGNOSIS — C9010 Plasma cell leukemia not having achieved remission: Secondary | ICD-10-CM

## 2023-11-12 MED ORDER — LENALIDOMIDE 10 MG PO CAPS: 10.0000 mg | ORAL_CAPSULE | Freq: Every day | ORAL | 0 refills | Status: DC

## 2023-11-23 ENCOUNTER — Inpatient Hospital Stay: Payer: Medicare Other | Attending: Hematology

## 2023-11-23 ENCOUNTER — Inpatient Hospital Stay (HOSPITAL_BASED_OUTPATIENT_CLINIC_OR_DEPARTMENT_OTHER): Payer: Medicare Other | Admitting: Hematology

## 2023-11-23 ENCOUNTER — Inpatient Hospital Stay: Payer: Medicare Other

## 2023-11-23 VITALS — BP 156/89 | HR 83 | Temp 97.5°F | Resp 20 | Wt 162.0 lb

## 2023-11-23 DIAGNOSIS — Z95828 Presence of other vascular implants and grafts: Secondary | ICD-10-CM

## 2023-11-23 DIAGNOSIS — M84551A Pathological fracture in neoplastic disease, right femur, initial encounter for fracture: Secondary | ICD-10-CM

## 2023-11-23 DIAGNOSIS — C9010 Plasma cell leukemia not having achieved remission: Secondary | ICD-10-CM

## 2023-11-23 DIAGNOSIS — C9001 Multiple myeloma in remission: Secondary | ICD-10-CM | POA: Diagnosis not present

## 2023-11-23 DIAGNOSIS — Z7189 Other specified counseling: Secondary | ICD-10-CM

## 2023-11-23 DIAGNOSIS — Z5111 Encounter for antineoplastic chemotherapy: Secondary | ICD-10-CM | POA: Insufficient documentation

## 2023-11-23 DIAGNOSIS — C9011 Plasma cell leukemia in remission: Secondary | ICD-10-CM | POA: Diagnosis not present

## 2023-11-23 LAB — CBC WITH DIFFERENTIAL (CANCER CENTER ONLY)
Abs Immature Granulocytes: 0.04 10*3/uL (ref 0.00–0.07)
Basophils Absolute: 0.2 10*3/uL — ABNORMAL HIGH (ref 0.0–0.1)
Basophils Relative: 2 %
Eosinophils Absolute: 0.2 10*3/uL (ref 0.0–0.5)
Eosinophils Relative: 3 %
HCT: 38.6 % — ABNORMAL LOW (ref 39.0–52.0)
Hemoglobin: 13.4 g/dL (ref 13.0–17.0)
Immature Granulocytes: 1 %
Lymphocytes Relative: 33 %
Lymphs Abs: 2.8 10*3/uL (ref 0.7–4.0)
MCH: 31.4 pg (ref 26.0–34.0)
MCHC: 34.7 g/dL (ref 30.0–36.0)
MCV: 90.4 fL (ref 80.0–100.0)
Monocytes Absolute: 1.2 10*3/uL — ABNORMAL HIGH (ref 0.1–1.0)
Monocytes Relative: 14 %
Neutro Abs: 4 10*3/uL (ref 1.7–7.7)
Neutrophils Relative %: 47 %
Platelet Count: 262 10*3/uL (ref 150–400)
RBC: 4.27 MIL/uL (ref 4.22–5.81)
RDW: 14.7 % (ref 11.5–15.5)
WBC Count: 8.4 10*3/uL (ref 4.0–10.5)
nRBC: 0 % (ref 0.0–0.2)

## 2023-11-23 LAB — CMP (CANCER CENTER ONLY)
ALT: 9 U/L (ref 0–44)
AST: 11 U/L — ABNORMAL LOW (ref 15–41)
Albumin: 4 g/dL (ref 3.5–5.0)
Alkaline Phosphatase: 64 U/L (ref 38–126)
Anion gap: 7 (ref 5–15)
BUN: 24 mg/dL — ABNORMAL HIGH (ref 8–23)
CO2: 26 mmol/L (ref 22–32)
Calcium: 9 mg/dL (ref 8.9–10.3)
Chloride: 108 mmol/L (ref 98–111)
Creatinine: 1.35 mg/dL — ABNORMAL HIGH (ref 0.61–1.24)
GFR, Estimated: 58 mL/min — ABNORMAL LOW (ref >60)
Glucose, Bld: 87 mg/dL (ref 70–99)
Potassium: 4.2 mmol/L (ref 3.5–5.1)
Sodium: 141 mmol/L (ref 135–145)
Total Bilirubin: 0.4 mg/dL (ref <1.2)
Total Protein: 6.8 g/dL (ref 6.5–8.1)

## 2023-11-23 MED ORDER — FAMOTIDINE IN NACL 20-0.9 MG/50ML-% IV SOLN
20.0000 mg | Freq: Once | INTRAVENOUS | Status: AC
  Administered 2023-11-23: 20 mg via INTRAVENOUS
  Filled 2023-11-23: qty 50

## 2023-11-23 MED ORDER — HEPARIN SOD (PORK) LOCK FLUSH 100 UNIT/ML IV SOLN
500.0000 [IU] | Freq: Once | INTRAVENOUS | Status: AC | PRN
  Administered 2023-11-23: 500 [IU]

## 2023-11-23 MED ORDER — ACETAMINOPHEN 325 MG PO TABS
650.0000 mg | ORAL_TABLET | Freq: Once | ORAL | Status: AC
  Administered 2023-11-23: 650 mg via ORAL
  Filled 2023-11-23: qty 2

## 2023-11-23 MED ORDER — SODIUM CHLORIDE 0.9% FLUSH
10.0000 mL | INTRAVENOUS | Status: DC | PRN
  Administered 2023-11-23: 10 mL

## 2023-11-23 MED ORDER — SODIUM CHLORIDE 0.9 % IV SOLN: Freq: Once | INTRAVENOUS | Status: AC

## 2023-11-23 MED ORDER — DEXAMETHASONE SODIUM PHOSPHATE 10 MG/ML IJ SOLN
6.0000 mg | Freq: Once | INTRAMUSCULAR | Status: AC
  Administered 2023-11-23: 6 mg via INTRAVENOUS
  Filled 2023-11-23: qty 1

## 2023-11-23 MED ORDER — DEXTROSE 5 % IV SOLN
56.0000 mg/m2 | Freq: Once | INTRAVENOUS | Status: AC
  Administered 2023-11-23: 100 mg via INTRAVENOUS
  Filled 2023-11-23: qty 30

## 2023-11-23 MED ORDER — SODIUM CHLORIDE 0.9% FLUSH
10.0000 mL | Freq: Once | INTRAVENOUS | Status: AC
  Administered 2023-11-23: 10 mL

## 2023-11-23 NOTE — Progress Notes (Signed)
HEMATOLOGY/ONCOLOGY CLINIC NOTE  Date of Service: 11/23/23   Chief Complaint: Follow-up for continued evaluation and management of multiple myeloma/plasma cell leukemia  HISTORY OF PRESENTING ILLNESS:  Please see previous note for details of initial presentation.  INTERVAL HISTORY:   JAMARRI DORING is here for continued evaluation and management of his multiple myeloma and continued Carfilzomib maintenance.    Patient was last seen by me on 09/28/2023 and complained of increased fatigue, intermittent SOB, increased coughing, and rhinorrhea with white phlegm. Patient noted intermittent RUQ pain and tingling in chest three days after taking Robitussin. He also reported having a cold 2-3 weeks prior to the visit.   Today, he reports that he has been doing well overall. Patient reports occasional balance issues with some missed steps. He denies any recent falls. Patient does not attribute balance issues to fatigue or any other known trigger. He continues to stays active to support muscle strength. He regularly works four days a week.   Patient reports that he is eating well. He denies any change in breathing,  infection issues, abdominal pain, new bone pain, new leg swelling, or new tingling/numbness in hands/feet. He reports that his previous leg tingling has resolved.   Patient has been tolerating Revlimid without any severe toxicities. He denies having any issues with any other medications.   Patient reports that he has been taking vitamin D supplements. He has not been taking vitamin B complex.   Patient reports that he is UTD with his age-appropriate vaccines, including flu, COVID-19 booster, and RSV vaccines.   REVIEW OF SYSTEMS:    10 Point review of Systems was done is negative except as noted above.   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   Tobacco abuse     SURGICAL HISTORY: Past Surgical History:  Procedure Laterality Date   APPENDECTOMY     FEMUR IM NAIL Right 11/20/2017   Procedure: RIGHT FEMORAL INTRAMEDULLARY (IM) NAIL;  Surgeon: Yolonda Kida, MD;  Location: [facility] OR;  Service: Orthopedics;  Laterality: Right;   IR IMAGING GUIDED PORT INSERTION  08/19/2021    SOCIAL HISTORY: Social History   Socioeconomic History   Marital status: Single    Spouse name: Not on file   Number of children: Not on file   Years of education: Not on file   Highest education level: Not on file  Occupational History   Not on file  Tobacco Use   Smoking status: Every Day    Current packs/day: 0.00    Types: Cigarettes    Last attempt to quit: 11/09/2017    Years since quitting: 6.0   Smokeless tobacco: Never   Tobacco comments:    1 cigarette/day as of 09/25/18  Vaping Use   Vaping status: Never Used  Substance and Sexual Activity   Alcohol use: Yes   Drug use: No   Sexual activity: Not on file  Other Topics Concern   Not on file  Social History Narrative   Not on file   Social Drivers of Health   Financial Resource Strain: Not on file  Food Insecurity: Low Risk  (04/24/2023)   [facility]   Hunger Vital Sign    Worried About Running Out of Food in the Last Year: Never true    Ran Out of Food in  the Last Year: Never true  Transportation Needs: Not on file (04/24/2023)  Physical Activity: Not on file  Stress: Not on file  Social Connections: Not on file  Intimate Partner Violence: Not on file    FAMILY HISTORY: Family History  Problem Relation Age of Onset   COPD Mother    Liver cancer Mother     ALLERGIES:  has no known allergies.  MEDICATIONS:  . Current Outpatient Medications on File Prior to Visit  Medication Sig Dispense Refill   acyclovir (ZOVIRAX) 400 MG tablet Take 1 tablet (400 mg total) by mouth daily. 90 tablet 6   Albuterol Sulfate (PROAIR  RESPICLICK) 108 (90 Base) MCG/ACT AEPB Inhale 1-2 puffs into the lungs every 6 (six) hours as needed. 1 each 1   aspirin EC 81 MG tablet Take 1 tablet (81 mg total) by mouth daily. 60 tablet 11   ferrous sulfate 325 (65 FE) MG EC tablet TAKE 1 TABLET BY MOUTH EVERY DAY WITH BREAKFAST 90 tablet 1   Fluticasone Propionate, Inhal, (FLUTICASONE PROPIONATE DISKUS) 250 MCG/ACT AEPB TAKE 1 PUFF BY MOUTH EVERY DAY 60 each 1   lenalidomide (REVLIMID) 10 MG capsule Take 1 capsule (10 mg total) by mouth daily. Take 1 capsule (10 mg) by mouth daily for 21 days and then take 7 days off. 21 capsule 0   lidocaine-prilocaine (EMLA) cream Apply 1 application topically as needed. Apply to Port-A-Cath at least 1 hr prior to treatment. 30 g 0   Multiple Vitamins-Minerals (MULTIVITAMIN WITH MINERALS) tablet Take 1 tablet by mouth at bedtime.     nicotine (NICODERM CQ - DOSED IN MG/24 HOURS) 14 mg/24hr patch PLACE 1 PATCH (14 MG TOTAL) ONTO THE SKIN DAILY. 28 patch 1   ondansetron (ZOFRAN) 4 MG tablet Take 2 tablets (8 mg total) by mouth every 8 (eight) hours as needed for nausea. (Patient not taking: Reported on 03/02/2023) 30 tablet 0   Vitamin D, Ergocalciferol, (DRISDOL) 1.25 MG (50000 UNIT) CAPS capsule Take 1 capsule (50,000 Units total) by mouth once a week. 12 capsule 6   Current Facility-Administered Medications on File Prior to Visit  Medication Dose Route Frequency Provider Last Rate Last Admin   acetaminophen (TYLENOL) 325 MG tablet            famotidine (PEPCID) 20 MG tablet             PHYSICAL EXAMINATION:  ECOG FS:1 - Symptomatic but completely ambulatory VSS Wt Readings from Last 3 Encounters:  11/09/23 164 lb 4 oz (74.5 kg)  10/26/23 164 lb 4 oz (74.5 kg)  09/28/23 161 lb 3.2 oz (73.1 kg)   GENERAL:alert, in no acute distress and comfortable SKIN: no acute rashes, no significant lesions EYES: conjunctiva are pink and non-injected, sclera anicteric OROPHARYNX: MMM, no exudates, no oropharyngeal  erythema or ulceration NECK: supple, no JVD LYMPH:  no palpable lymphadenopathy in the cervical, axillary or inguinal regions LUNGS: clear to auscultation b/l with normal respiratory effort HEART: regular rate & rhythm ABDOMEN:  normoactive bowel sounds , non tender, not distended. Extremity: no pedal edema PSYCH: alert & oriented x 3 with fluent speech NEURO: no focal motor/sensory deficits   LABORATORY DATA:  I have reviewed the data as listed     Latest Ref Rng & Units 11/23/2023    9:30 AM 11/09/2023    8:49 AM 10/26/2023    8:55 AM  CBC  WBC 4.0 - 10.5 K/uL 8.4  7.3  7.3   Hemoglobin 13.0 -  17.0 g/dL 78.2  95.6  21.3   Hematocrit 39.0 - 52.0 % 38.6  40.1  39.3   Platelets 150 - 400 K/uL 262  221  293    CBC    Component Value Date/Time   WBC 7.3 11/09/2023 0849   WBC 10.1 08/23/2022 0835   RBC 4.37 11/09/2023 0849   HGB 13.7 11/09/2023 0849   HGB 8.3 (L) 12/14/2017 1230   HCT 40.1 11/09/2023 0849   HCT 23.9 (L) 12/14/2017 1230   PLT 221 11/09/2023 0849   PLT 524 (H) 12/14/2017 1230   MCV 91.8 11/09/2023 0849   MCV 88.4 12/14/2017 1230   MCH 31.4 11/09/2023 0849   MCHC 34.2 11/09/2023 0849   RDW 14.6 11/09/2023 0849   RDW 15.3 (H) 12/14/2017 1230   LYMPHSABS 2.2 11/09/2023 0849   LYMPHSABS 1.4 12/14/2017 1230   MONOABS 0.9 11/09/2023 0849   MONOABS 0.6 12/14/2017 1230   EOSABS 0.2 11/09/2023 0849   EOSABS 0.1 12/14/2017 1230   BASOSABS 0.1 11/09/2023 0849   BASOSABS 0.1 12/14/2017 1230        Latest Ref Rng & Units 11/23/2023    9:30 AM 11/09/2023    8:49 AM 10/26/2023    8:55 AM  CMP  Glucose 70 - 99 mg/dL 87  086  95   BUN 8 - 23 mg/dL 24  23  23    Creatinine 0.61 - 1.24 mg/dL 5.78  4.69  6.29   Sodium 135 - 145 mmol/L 141  141  140   Potassium 3.5 - 5.1 mmol/L 4.2  4.0  4.2   Chloride 98 - 111 mmol/L 108  108  108   CO2 22 - 32 mmol/L 26  24  26    Calcium 8.9 - 10.3 mg/dL 9.0  9.2  9.3   Total Protein 6.5 - 8.1 g/dL 6.8  7.0  7.2   Total  Bilirubin <1.2 mg/dL 0.4  0.5  0.5   Alkaline Phos 38 - 126 U/L 64  75  77   AST 15 - 41 U/L 11  12  10    ALT 0 - 44 U/L 9  10  9       03/09/20 ([id number]) Bone Marrow Biopsy at [facility]   07/02/2019 Bone Marrow Biopsy ([id number]) at [facility]    08/07/18 BM Bx:        RADIOGRAPHIC STUDIES: I have personally reviewed the radiological images as listed and agreed with the findings in the report. No results found.  ASSESSMENT & PLAN:    67 y.o. 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   Initial BM Bx showed >95% involvement with kappa restricted serum free light chains. 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 MoL Cy-   08/07/18 BM Bx revealed normocellular bone marrow with trilineage hematopoiesis and 1% plasma cells which indicates the pt is in remission 08/06/18 PET/CT revealed  Expansile lytic lesion involving the left iliac bone, unchanged. Lytic lesion involving the sternum, unchanged. While both are suspicious for myeloma, neither lesion is FDG avid. No hypermetabolic osseous lesions in the visualized axial and appendicular skeleton. Please note that the bilateral lower extremities were not imaged. No suspicious lymphadenopathy. Spleen is normal in size.  Pt began Ninlaro 3mg  maintenance after pt finished 13 cycles of CyBorD and BM bx showed only 1% plasma cells, then resumed CyBorD and held Ninlaro in anticipation of his tandem bone marrow transplants beginning in late December 2019 with Dr. Shara Blazing at [facility]   11/19/18 High dose chemotherapy with Melphalan 140mg /m2 and autologous stem cell rescue  03/20/19 High dose chemotherapy with Melphalan  200mg /m2 and autologous stem cell rescue. Non-infectious diarrhea, uncomplicated course otherwise.  07/02/2019 PET scan with results revealing "1. No FDG avid bone lesions are identified. 2.  Asymmetric uptake within the right, greater than left, palatine tonsils with mild fullness of the right tonsillar soft tissue. ENT consult vs attention on follow up suggested. 3.  Innumerable mixed sclerotic and lytic lesions throughout the axial and appendicular skeleton without associated hypermetabolic activity. 4.  Ancillary CT findings as above."  07/02/2019 bone marrow biopsy ([id number])with results showing: "Trilineage hematopoieses with maturation. No plasma cell myeloma identified. Mild anemia and No Rouleaux or circulating plasma cells identified in the peripheral blood."  2) s/p Prophylactic IM nailing for rt femur  completed Physical Therapy at [facility].  -patient previously reported improvement and is currently progressively back to full time work.  3) Thrombocytopenia resolved   4) Renal insufficiency --likely related to multiple myeloma.  Remained fairly stable with baseline creatinine of 1.5-2.1.  Creatinine is 1.44 today Plan -maintain follow up with nephrology referral to Dr Arrie [facility] for continued treatment -Pt completed 8 weeks of maverick for hep c with Dr Luciana Axe on 07/08/18.  -Kidney numbers improved. Creatinine in the 1.7-2.2 range   5) h/o tobacco abuse-counseled on smoking cessation -Currently not smoking.   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 Hemoglobin is stable at 12.8  PLAN:  -Discussed lab results on 11/23/23 in detail with patient. CBC stable, showed WBC of 8.4K, hemoglobin of 13.4, and platelets of 262K. -patient's light chains were normal 1 month ago  -Patient has no lab or clinical evidence of progression of his plasma cell leukemia/multiple myeloma   -continue Revlimid maintainence -continue maintenance Carfilzomib every two weeks with same supportive medications. Continue ASA and Acyclovir. -recommend patient to take vitamin B complex supplements at least three days a week to ensure that he is not deficient in any B vitamins and support muscle activity, nerve health, and balance.  -advised patient to let us know if his balance issues are significantly bothersome -UTD with his age-appropriate vaccines, including flu, COVID-19 booster, and RSV vaccines.   FOLLOW-UP: Per integrated scheduling  The total time spent in the appointment was 30 minutes* .  All of the patient's questions were answered with apparent satisfaction. The patient knows to call the clinic with any problems, questions or concerns.   Wyvonnia Lora MD MS AAHIVMS [facility] Hematology/Oncology [facility]  .*Total Encounter Time as defined by the Centers for Medicare and Medicaid Services includes, in addition to the face-to-face time of a patient visit (documented in the note above) non-face-to-face time: obtaining and reviewing outside history, ordering and reviewing medications, tests or procedures, care coordination (communications with other health care professionals or caregivers) and documentation in the medical record.    I,Mitra Faeizi,acting as a Neurosurgeon for Wyvonnia Lora, MD.,have documented all relevant documentation on the behalf of Wyvonnia Lora, MD,as directed by  Wyvonnia Lora, MD while in the presence of Wyvonnia Lora, MD.  .I  have reviewed the above documentation for accuracy and completeness, and I agree with the above. Johney Maine MD

## 2023-11-23 NOTE — Patient Instructions (Signed)
 [facility]  Discharge Instructions: Thank you for choosing [facility] to provide your oncology and hematology care.   If you have a lab appointment with the Cancer Center, please go directly to the Cancer Center and check in at the registration area.   Wear comfortable clothing and clothing appropriate for easy access to any Portacath or PICC line.   We strive to give you quality time with your provider. You may need to reschedule your appointment if you arrive late (15 or more minutes).  Arriving late affects you and other patients whose appointments are after yours.  Also, if you miss three or more appointments without notifying the office, you may be dismissed from the clinic at the provider's discretion.      For prescription refill requests, have your pharmacy contact our office and allow 72 hours for refills to be completed.    Today you received the following chemotherapy and/or immunotherapy agents: Kyprolis      To help prevent nausea and vomiting after your treatment, we encourage you to take your nausea medication as directed.  BELOW ARE SYMPTOMS THAT SHOULD BE REPORTED IMMEDIATELY: *FEVER GREATER THAN 100.4 F (38 C) OR HIGHER *CHILLS OR SWEATING *NAUSEA AND VOMITING THAT IS NOT CONTROLLED WITH YOUR NAUSEA MEDICATION *UNUSUAL SHORTNESS OF BREATH *UNUSUAL BRUISING OR BLEEDING *URINARY PROBLEMS (pain or burning when urinating, or frequent urination) *BOWEL PROBLEMS (unusual diarrhea, constipation, pain near the anus) TENDERNESS IN MOUTH AND THROAT WITH OR WITHOUT PRESENCE OF ULCERS (sore throat, sores in mouth, or a toothache) UNUSUAL RASH, SWELLING OR PAIN  UNUSUAL VAGINAL DISCHARGE OR ITCHING   Items with * indicate a potential emergency and should be followed up as soon as possible or go to the Emergency Department if any problems should occur.  Please show the CHEMOTHERAPY ALERT CARD or IMMUNOTHERAPY  ALERT CARD at check-in to the Emergency Department and triage nurse.  Should you have questions after your visit or need to cancel or reschedule your appointment, please contact [facility]  Dept: [phone]  and follow the prompts.  Office hours are 8:00 a.m. to 4:30 p.m. Monday - Friday. Please note that voicemails left after 4:00 p.m. may not be returned until the following business day.  We are closed weekends and major holidays. You have access to a nurse at all times for urgent questions. Please call the main number to the clinic Dept: [phone] and follow the prompts.   For any non-urgent questions, you may also contact your provider using MyChart. We now offer e-Visits for anyone 73 and older to request care online for non-urgent symptoms. For details visit [email or web address].   Also download the MyChart app! Go to the app store, search "MyChart", open the app, select [place], and log in with your MyChart username and password.

## 2023-11-23 NOTE — Progress Notes (Signed)
Patient seen by Dr. Addison Naegeli are within treatment parameters.  Labs reviewed: and are within treatment parameters. CMP pending . Pt ok for tx as long as CR: < 2.0  Per physician team, patient is ready for treatment and there are NO modifications to the treatment plan.

## 2023-11-24 ENCOUNTER — Other Ambulatory Visit: Payer: Self-pay

## 2023-11-26 LAB — KAPPA/LAMBDA LIGHT CHAINS
Kappa free light chain: 20.8 mg/L — ABNORMAL HIGH (ref 3.3–19.4)
Kappa, lambda light chain ratio: 1.2 (ref 0.26–1.65)
Lambda free light chains: 17.4 mg/L (ref 5.7–26.3)

## 2023-11-29 ENCOUNTER — Encounter: Payer: Self-pay | Admitting: Hematology

## 2023-11-29 ENCOUNTER — Other Ambulatory Visit: Payer: Self-pay

## 2023-12-07 ENCOUNTER — Inpatient Hospital Stay: Payer: Medicare Other

## 2023-12-07 VITALS — BP 152/88 | HR 78 | Temp 98.2°F | Resp 18 | Wt 165.4 lb

## 2023-12-07 DIAGNOSIS — Z5111 Encounter for antineoplastic chemotherapy: Secondary | ICD-10-CM | POA: Diagnosis not present

## 2023-12-07 DIAGNOSIS — Z7189 Other specified counseling: Secondary | ICD-10-CM

## 2023-12-07 DIAGNOSIS — M84551A Pathological fracture in neoplastic disease, right femur, initial encounter for fracture: Secondary | ICD-10-CM

## 2023-12-07 DIAGNOSIS — C9010 Plasma cell leukemia not having achieved remission: Secondary | ICD-10-CM

## 2023-12-07 DIAGNOSIS — Z95828 Presence of other vascular implants and grafts: Secondary | ICD-10-CM

## 2023-12-07 LAB — CBC WITH DIFFERENTIAL (CANCER CENTER ONLY)
Abs Immature Granulocytes: 0.02 10*3/uL (ref 0.00–0.07)
Basophils Absolute: 0.1 10*3/uL (ref 0.0–0.1)
Basophils Relative: 2 %
Eosinophils Absolute: 0.1 10*3/uL (ref 0.0–0.5)
Eosinophils Relative: 2 %
HCT: 40.6 % (ref 39.0–52.0)
Hemoglobin: 13.8 g/dL (ref 13.0–17.0)
Immature Granulocytes: 0 %
Lymphocytes Relative: 31 %
Lymphs Abs: 2.3 10*3/uL (ref 0.7–4.0)
MCH: 30.7 pg (ref 26.0–34.0)
MCHC: 34 g/dL (ref 30.0–36.0)
MCV: 90.2 fL (ref 80.0–100.0)
Monocytes Absolute: 1 10*3/uL (ref 0.1–1.0)
Monocytes Relative: 13 %
Neutro Abs: 3.9 10*3/uL (ref 1.7–7.7)
Neutrophils Relative %: 52 %
Platelet Count: 235 10*3/uL (ref 150–400)
RBC: 4.5 MIL/uL (ref 4.22–5.81)
RDW: 14.8 % (ref 11.5–15.5)
WBC Count: 7.4 10*3/uL (ref 4.0–10.5)
nRBC: 0 % (ref 0.0–0.2)

## 2023-12-07 LAB — CMP (CANCER CENTER ONLY)
ALT: 10 U/L (ref 0–44)
AST: 9 U/L — ABNORMAL LOW (ref 15–41)
Albumin: 4.2 g/dL (ref 3.5–5.0)
Alkaline Phosphatase: 60 U/L (ref 38–126)
Anion gap: 7 (ref 5–15)
BUN: 23 mg/dL (ref 8–23)
CO2: 26 mmol/L (ref 22–32)
Calcium: 9.3 mg/dL (ref 8.9–10.3)
Chloride: 107 mmol/L (ref 98–111)
Creatinine: 1.5 mg/dL — ABNORMAL HIGH (ref 0.61–1.24)
GFR, Estimated: 51 mL/min — ABNORMAL LOW (ref >60)
Glucose, Bld: 106 mg/dL — ABNORMAL HIGH (ref 70–99)
Potassium: 4.1 mmol/L (ref 3.5–5.1)
Sodium: 140 mmol/L (ref 135–145)
Total Bilirubin: 0.5 mg/dL (ref <1.2)
Total Protein: 7.1 g/dL (ref 6.5–8.1)

## 2023-12-07 MED ORDER — DEXTROSE 5 % IV SOLN
56.0000 mg/m2 | Freq: Once | INTRAVENOUS | Status: AC
Start: 2023-12-07 — End: 2023-12-07
  Administered 2023-12-07: 100 mg via INTRAVENOUS
  Filled 2023-12-07: qty 30

## 2023-12-07 MED ORDER — SODIUM CHLORIDE 0.9 % IV SOLN: Freq: Once | INTRAVENOUS | Status: AC

## 2023-12-07 MED ORDER — SODIUM CHLORIDE 0.9% FLUSH
10.0000 mL | Freq: Once | INTRAVENOUS | Status: AC
  Administered 2023-12-07: 10 mL

## 2023-12-07 MED ORDER — FAMOTIDINE IN NACL 20-0.9 MG/50ML-% IV SOLN
20.0000 mg | Freq: Once | INTRAVENOUS | Status: AC
  Administered 2023-12-07: 20 mg via INTRAVENOUS
  Filled 2023-12-07: qty 50

## 2023-12-07 MED ORDER — DEXAMETHASONE SODIUM PHOSPHATE 10 MG/ML IJ SOLN
6.0000 mg | Freq: Once | INTRAMUSCULAR | Status: AC
Start: 2023-12-07 — End: 2023-12-07
  Administered 2023-12-07: 6 mg via INTRAVENOUS
  Filled 2023-12-07: qty 1

## 2023-12-07 MED ORDER — ACETAMINOPHEN 325 MG PO TABS
650.0000 mg | ORAL_TABLET | Freq: Once | ORAL | Status: AC
Start: 2023-12-07 — End: 2023-12-07
  Administered 2023-12-07: 650 mg via ORAL
  Filled 2023-12-07: qty 2

## 2023-12-07 NOTE — Patient Instructions (Signed)
 [facility]  Discharge Instructions: Thank you for choosing [facility] to provide your oncology and hematology care.   If you have a lab appointment with the Cancer Center, please go directly to the Cancer Center and check in at the registration area.   Wear comfortable clothing and clothing appropriate for easy access to any Portacath or PICC line.   We strive to give you quality time with your provider. You may need to reschedule your appointment if you arrive late (15 or more minutes).  Arriving late affects you and other patients whose appointments are after yours.  Also, if you miss three or more appointments without notifying the office, you may be dismissed from the clinic at the provider's discretion.      For prescription refill requests, have your pharmacy contact our office and allow 72 hours for refills to be completed.    Today you received the following chemotherapy and/or immunotherapy agents: Kyprolis      To help prevent nausea and vomiting after your treatment, we encourage you to take your nausea medication as directed.  BELOW ARE SYMPTOMS THAT SHOULD BE REPORTED IMMEDIATELY: *FEVER GREATER THAN 100.4 F (38 C) OR HIGHER *CHILLS OR SWEATING *NAUSEA AND VOMITING THAT IS NOT CONTROLLED WITH YOUR NAUSEA MEDICATION *UNUSUAL SHORTNESS OF BREATH *UNUSUAL BRUISING OR BLEEDING *URINARY PROBLEMS (pain or burning when urinating, or frequent urination) *BOWEL PROBLEMS (unusual diarrhea, constipation, pain near the anus) TENDERNESS IN MOUTH AND THROAT WITH OR WITHOUT PRESENCE OF ULCERS (sore throat, sores in mouth, or a toothache) UNUSUAL RASH, SWELLING OR PAIN  UNUSUAL VAGINAL DISCHARGE OR ITCHING   Items with * indicate a potential emergency and should be followed up as soon as possible or go to the Emergency Department if any problems should occur.  Please show the CHEMOTHERAPY ALERT CARD or IMMUNOTHERAPY  ALERT CARD at check-in to the Emergency Department and triage nurse.  Should you have questions after your visit or need to cancel or reschedule your appointment, please contact [facility]  Dept: [phone]  and follow the prompts.  Office hours are 8:00 a.m. to 4:30 p.m. Monday - Friday. Please note that voicemails left after 4:00 p.m. may not be returned until the following business day.  We are closed weekends and major holidays. You have access to a nurse at all times for urgent questions. Please call the main number to the clinic Dept: [phone] and follow the prompts.   For any non-urgent questions, you may also contact your provider using MyChart. We now offer e-Visits for anyone 73 and older to request care online for non-urgent symptoms. For details visit [email or web address].   Also download the MyChart app! Go to the app store, search "MyChart", open the app, select [place], and log in with your MyChart username and password.

## 2023-12-11 ENCOUNTER — Other Ambulatory Visit: Payer: Self-pay

## 2023-12-11 DIAGNOSIS — C9010 Plasma cell leukemia not having achieved remission: Secondary | ICD-10-CM

## 2023-12-11 MED ORDER — LENALIDOMIDE 10 MG PO CAPS: 10.0000 mg | ORAL_CAPSULE | Freq: Every day | ORAL | 0 refills | Status: DC

## 2023-12-21 ENCOUNTER — Inpatient Hospital Stay: Payer: Medicare Other

## 2023-12-21 ENCOUNTER — Inpatient Hospital Stay: Payer: Medicare Other | Attending: Hematology

## 2023-12-21 VITALS — BP 144/83 | HR 84 | Temp 97.6°F | Resp 18

## 2023-12-21 DIAGNOSIS — M84551A Pathological fracture in neoplastic disease, right femur, initial encounter for fracture: Secondary | ICD-10-CM

## 2023-12-21 DIAGNOSIS — Z7189 Other specified counseling: Secondary | ICD-10-CM

## 2023-12-21 DIAGNOSIS — Z5111 Encounter for antineoplastic chemotherapy: Secondary | ICD-10-CM | POA: Diagnosis present

## 2023-12-21 DIAGNOSIS — C9011 Plasma cell leukemia in remission: Secondary | ICD-10-CM | POA: Insufficient documentation

## 2023-12-21 DIAGNOSIS — C9010 Plasma cell leukemia not having achieved remission: Secondary | ICD-10-CM

## 2023-12-21 DIAGNOSIS — C9001 Multiple myeloma in remission: Secondary | ICD-10-CM | POA: Diagnosis not present

## 2023-12-21 DIAGNOSIS — Z95828 Presence of other vascular implants and grafts: Secondary | ICD-10-CM

## 2023-12-21 LAB — CMP (CANCER CENTER ONLY)
ALT: 9 U/L (ref 0–44)
AST: 11 U/L — ABNORMAL LOW (ref 15–41)
Albumin: 4.2 g/dL (ref 3.5–5.0)
Alkaline Phosphatase: 64 U/L (ref 38–126)
Anion gap: 8 (ref 5–15)
BUN: 28 mg/dL — ABNORMAL HIGH (ref 8–23)
CO2: 26 mmol/L (ref 22–32)
Calcium: 9.3 mg/dL (ref 8.9–10.3)
Chloride: 108 mmol/L (ref 98–111)
Creatinine: 1.54 mg/dL — ABNORMAL HIGH (ref 0.61–1.24)
GFR, Estimated: 49 mL/min — ABNORMAL LOW (ref >60)
Glucose, Bld: 89 mg/dL (ref 70–99)
Potassium: 4.5 mmol/L (ref 3.5–5.1)
Sodium: 142 mmol/L (ref 135–145)
Total Bilirubin: 0.4 mg/dL (ref 0.0–1.2)
Total Protein: 6.9 g/dL (ref 6.5–8.1)

## 2023-12-21 LAB — CBC WITH DIFFERENTIAL (CANCER CENTER ONLY)
Abs Immature Granulocytes: 0.03 10*3/uL (ref 0.00–0.07)
Basophils Absolute: 0.1 10*3/uL (ref 0.0–0.1)
Basophils Relative: 1 %
Eosinophils Absolute: 0.2 10*3/uL (ref 0.0–0.5)
Eosinophils Relative: 3 %
HCT: 40 % (ref 39.0–52.0)
Hemoglobin: 13.8 g/dL (ref 13.0–17.0)
Immature Granulocytes: 0 %
Lymphocytes Relative: 32 %
Lymphs Abs: 2.5 10*3/uL (ref 0.7–4.0)
MCH: 30.5 pg (ref 26.0–34.0)
MCHC: 34.5 g/dL (ref 30.0–36.0)
MCV: 88.5 fL (ref 80.0–100.0)
Monocytes Absolute: 1 10*3/uL (ref 0.1–1.0)
Monocytes Relative: 13 %
Neutro Abs: 3.8 10*3/uL (ref 1.7–7.7)
Neutrophils Relative %: 51 %
Platelet Count: 278 10*3/uL (ref 150–400)
RBC: 4.52 MIL/uL (ref 4.22–5.81)
RDW: 14.7 % (ref 11.5–15.5)
WBC Count: 7.6 10*3/uL (ref 4.0–10.5)
nRBC: 0 % (ref 0.0–0.2)

## 2023-12-21 MED ORDER — SODIUM CHLORIDE 0.9 % IV SOLN: Freq: Once | INTRAVENOUS | Status: AC

## 2023-12-21 MED ORDER — SODIUM CHLORIDE 0.9% FLUSH
10.0000 mL | Freq: Once | INTRAVENOUS | Status: AC
  Administered 2023-12-21: 10 mL

## 2023-12-21 MED ORDER — DEXTROSE 5 % IV SOLN
56.0000 mg/m2 | Freq: Once | INTRAVENOUS | Status: AC
  Administered 2023-12-21: 100 mg via INTRAVENOUS
  Filled 2023-12-21: qty 30

## 2023-12-21 MED ORDER — DEXAMETHASONE SODIUM PHOSPHATE 10 MG/ML IJ SOLN
6.0000 mg | Freq: Once | INTRAMUSCULAR | Status: AC
  Administered 2023-12-21: 6 mg via INTRAVENOUS
  Filled 2023-12-21: qty 1

## 2023-12-21 MED ORDER — SODIUM CHLORIDE 0.9% FLUSH
10.0000 mL | INTRAVENOUS | Status: DC | PRN
  Administered 2023-12-21: 10 mL

## 2023-12-21 MED ORDER — ACETAMINOPHEN 325 MG PO TABS
650.0000 mg | ORAL_TABLET | Freq: Once | ORAL | Status: AC
Start: 2023-12-21 — End: 2023-12-21
  Administered 2023-12-21: 650 mg via ORAL
  Filled 2023-12-21: qty 2

## 2023-12-21 MED ORDER — FAMOTIDINE IN NACL 20-0.9 MG/50ML-% IV SOLN
20.0000 mg | Freq: Once | INTRAVENOUS | Status: AC
  Administered 2023-12-21: 20 mg via INTRAVENOUS
  Filled 2023-12-21: qty 50

## 2023-12-21 MED ORDER — HEPARIN SOD (PORK) LOCK FLUSH 100 UNIT/ML IV SOLN
500.0000 [IU] | Freq: Once | INTRAVENOUS | Status: AC | PRN
  Administered 2023-12-21: 500 [IU]

## 2023-12-21 NOTE — Progress Notes (Signed)
 Pt ok for tx today with CR: 1.54

## 2023-12-21 NOTE — Patient Instructions (Signed)
 [facility]  Discharge Instructions: Thank you for choosing [facility] to provide your oncology and hematology care.   If you have a lab appointment with the Cancer Center, please go directly to the Cancer Center and check in at the registration area.   Wear comfortable clothing and clothing appropriate for easy access to any Portacath or PICC line.   We strive to give you quality time with your provider. You may need to reschedule your appointment if you arrive late (15 or more minutes).  Arriving late affects you and other patients whose appointments are after yours.  Also, if you miss three or more appointments without notifying the office, you may be dismissed from the clinic at the provider's discretion.      For prescription refill requests, have your pharmacy contact our office and allow 72 hours for refills to be completed.    Today you received the following chemotherapy and/or immunotherapy agents: carfilzomib      To help prevent nausea and vomiting after your treatment, we encourage you to take your nausea medication as directed.  BELOW ARE SYMPTOMS THAT SHOULD BE REPORTED IMMEDIATELY: *FEVER GREATER THAN 100.4 F (38 C) OR HIGHER *CHILLS OR SWEATING *NAUSEA AND VOMITING THAT IS NOT CONTROLLED WITH YOUR NAUSEA MEDICATION *UNUSUAL SHORTNESS OF BREATH *UNUSUAL BRUISING OR BLEEDING *URINARY PROBLEMS (pain or burning when urinating, or frequent urination) *BOWEL PROBLEMS (unusual diarrhea, constipation, pain near the anus) TENDERNESS IN MOUTH AND THROAT WITH OR WITHOUT PRESENCE OF ULCERS (sore throat, sores in mouth, or a toothache) UNUSUAL RASH, SWELLING OR PAIN  UNUSUAL VAGINAL DISCHARGE OR ITCHING   Items with * indicate a potential emergency and should be followed up as soon as possible or go to the Emergency Department if any problems should occur.  Please show the CHEMOTHERAPY ALERT CARD or  IMMUNOTHERAPY ALERT CARD at check-in to the Emergency Department and triage nurse.  Should you have questions after your visit or need to cancel or reschedule your appointment, please contact [facility]  Dept: [phone]  and follow the prompts.  Office hours are 8:00 a.m. to 4:30 p.m. Monday - Friday. Please note that voicemails left after 4:00 p.m. may not be returned until the following business day.  We are closed weekends and major holidays. You have access to a nurse at all times for urgent questions. Please call the main number to the clinic Dept: [phone] and follow the prompts.   For any non-urgent questions, you may also contact your provider using MyChart. We now offer e-Visits for anyone 73 and older to request care online for non-urgent symptoms. For details visit [email or web address].   Also download the MyChart app! Go to the app store, search "MyChart", open the app, select [place], and log in with your MyChart username and password.

## 2023-12-24 LAB — KAPPA/LAMBDA LIGHT CHAINS
Kappa free light chain: 12.5 mg/L (ref 3.3–19.4)
Kappa, lambda light chain ratio: 1.2 (ref 0.26–1.65)
Lambda free light chains: 10.4 mg/L (ref 5.7–26.3)

## 2024-01-04 ENCOUNTER — Inpatient Hospital Stay: Payer: Medicare Other

## 2024-01-04 ENCOUNTER — Inpatient Hospital Stay: Payer: Medicare Other | Admitting: Hematology

## 2024-01-04 ENCOUNTER — Encounter: Payer: Self-pay | Admitting: Hematology

## 2024-01-04 VITALS — BP 154/84 | HR 87 | Temp 98.8°F | Resp 16 | Wt 161.7 lb

## 2024-01-04 DIAGNOSIS — C9010 Plasma cell leukemia not having achieved remission: Secondary | ICD-10-CM

## 2024-01-04 DIAGNOSIS — C9000 Multiple myeloma not having achieved remission: Secondary | ICD-10-CM

## 2024-01-04 DIAGNOSIS — M84551A Pathological fracture in neoplastic disease, right femur, initial encounter for fracture: Secondary | ICD-10-CM

## 2024-01-04 DIAGNOSIS — Z7189 Other specified counseling: Secondary | ICD-10-CM

## 2024-01-04 DIAGNOSIS — Z95828 Presence of other vascular implants and grafts: Secondary | ICD-10-CM

## 2024-01-04 DIAGNOSIS — Z5111 Encounter for antineoplastic chemotherapy: Secondary | ICD-10-CM

## 2024-01-04 LAB — CMP (CANCER CENTER ONLY)
ALT: 10 U/L (ref 0–44)
AST: 10 U/L — ABNORMAL LOW (ref 15–41)
Albumin: 4.2 g/dL (ref 3.5–5.0)
Alkaline Phosphatase: 58 U/L (ref 38–126)
Anion gap: 7 (ref 5–15)
BUN: 24 mg/dL — ABNORMAL HIGH (ref 8–23)
CO2: 26 mmol/L (ref 22–32)
Calcium: 9 mg/dL (ref 8.9–10.3)
Chloride: 106 mmol/L (ref 98–111)
Creatinine: 1.55 mg/dL — ABNORMAL HIGH (ref 0.61–1.24)
GFR, Estimated: 49 mL/min — ABNORMAL LOW (ref >60)
Glucose, Bld: 91 mg/dL (ref 70–99)
Potassium: 4.1 mmol/L (ref 3.5–5.1)
Sodium: 139 mmol/L (ref 135–145)
Total Bilirubin: 0.7 mg/dL (ref 0.0–1.2)
Total Protein: 7 g/dL (ref 6.5–8.1)

## 2024-01-04 LAB — CBC WITH DIFFERENTIAL (CANCER CENTER ONLY)
Abs Immature Granulocytes: 0.06 10*3/uL (ref 0.00–0.07)
Basophils Absolute: 0 10*3/uL (ref 0.0–0.1)
Basophils Relative: 0 %
Eosinophils Absolute: 0.2 10*3/uL (ref 0.0–0.5)
Eosinophils Relative: 2 %
HCT: 38.8 % — ABNORMAL LOW (ref 39.0–52.0)
Hemoglobin: 13.2 g/dL (ref 13.0–17.0)
Immature Granulocytes: 1 %
Lymphocytes Relative: 15 %
Lymphs Abs: 1.6 10*3/uL (ref 0.7–4.0)
MCH: 30.7 pg (ref 26.0–34.0)
MCHC: 34 g/dL (ref 30.0–36.0)
MCV: 90.2 fL (ref 80.0–100.0)
Monocytes Absolute: 1.2 10*3/uL — ABNORMAL HIGH (ref 0.1–1.0)
Monocytes Relative: 11 %
Neutro Abs: 7.7 10*3/uL (ref 1.7–7.7)
Neutrophils Relative %: 71 %
Platelet Count: 208 10*3/uL (ref 150–400)
RBC: 4.3 MIL/uL (ref 4.22–5.81)
RDW: 15.4 % (ref 11.5–15.5)
WBC Count: 10.8 10*3/uL — ABNORMAL HIGH (ref 4.0–10.5)
nRBC: 0 % (ref 0.0–0.2)

## 2024-01-04 MED ORDER — SODIUM CHLORIDE 0.9 % IV SOLN: Freq: Once | INTRAVENOUS | Status: AC

## 2024-01-04 MED ORDER — DEXAMETHASONE SODIUM PHOSPHATE 10 MG/ML IJ SOLN
6.0000 mg | Freq: Once | INTRAMUSCULAR | Status: AC
  Administered 2024-01-04: 6 mg via INTRAVENOUS
  Filled 2024-01-04: qty 1

## 2024-01-04 MED ORDER — FAMOTIDINE IN NACL 20-0.9 MG/50ML-% IV SOLN
20.0000 mg | Freq: Once | INTRAVENOUS | Status: AC
  Administered 2024-01-04: 20 mg via INTRAVENOUS
  Filled 2024-01-04: qty 50

## 2024-01-04 MED ORDER — DEXTROSE 5 % IV SOLN
56.0000 mg/m2 | Freq: Once | INTRAVENOUS | Status: AC
  Administered 2024-01-04: 100 mg via INTRAVENOUS
  Filled 2024-01-04: qty 30

## 2024-01-04 MED ORDER — SODIUM CHLORIDE 0.9% FLUSH
10.0000 mL | INTRAVENOUS | Status: DC | PRN
  Administered 2024-01-04: 10 mL

## 2024-01-04 MED ORDER — HEPARIN SOD (PORK) LOCK FLUSH 100 UNIT/ML IV SOLN
500.0000 [IU] | Freq: Once | INTRAVENOUS | Status: AC | PRN
  Administered 2024-01-04: 500 [IU]

## 2024-01-04 MED ORDER — ACETAMINOPHEN 325 MG PO TABS
650.0000 mg | ORAL_TABLET | Freq: Once | ORAL | Status: AC
  Administered 2024-01-04: 650 mg via ORAL
  Filled 2024-01-04: qty 2

## 2024-01-04 MED ORDER — SODIUM CHLORIDE 0.9% FLUSH
10.0000 mL | Freq: Once | INTRAVENOUS | Status: AC
Start: 2024-01-04 — End: 2024-01-04
  Administered 2024-01-04: 10 mL

## 2024-01-04 NOTE — Patient Instructions (Signed)
[facility]  Discharge Instructions: Thank you for choosing [facility] to provide your oncology and hematology care.   If you have a lab appointment with the Cancer Center, please go directly to the Cancer Center and check in at the registration area.   Wear comfortable clothing and clothing appropriate for easy access to any Portacath or PICC line.   We strive to give you quality time with your provider. You may need to reschedule your appointment if you arrive late (15 or more minutes).  Arriving late affects you and other patients whose appointments are after yours.  Also, if you miss three or more appointments without notifying the office, you may be dismissed from the clinic at the provider's discretion.      For prescription refill requests, have your pharmacy contact our office and allow 72 hours for refills to be completed.    Today you received the following chemotherapy and/or immunotherapy agents: Kyprolis.       To help prevent nausea and vomiting after your treatment, we encourage you to take your nausea medication as directed.  BELOW ARE SYMPTOMS THAT SHOULD BE REPORTED IMMEDIATELY: *FEVER GREATER THAN 100.4 F (38 C) OR HIGHER *CHILLS OR SWEATING *NAUSEA AND VOMITING THAT IS NOT CONTROLLED WITH YOUR NAUSEA MEDICATION *UNUSUAL SHORTNESS OF BREATH *UNUSUAL BRUISING OR BLEEDING *URINARY PROBLEMS (pain or burning when urinating, or frequent urination) *BOWEL PROBLEMS (unusual diarrhea, constipation, pain near the anus) TENDERNESS IN MOUTH AND THROAT WITH OR WITHOUT PRESENCE OF ULCERS (sore throat, sores in mouth, or a toothache) UNUSUAL RASH, SWELLING OR PAIN  UNUSUAL VAGINAL DISCHARGE OR ITCHING   Items with * indicate a potential emergency and should be followed up as soon as possible or go to the Emergency Department if any problems should occur.  Please show the CHEMOTHERAPY ALERT CARD or IMMUNOTHERAPY  ALERT CARD at check-in to the Emergency Department and triage nurse.  Should you have questions after your visit or need to cancel or reschedule your appointment, please contact [facility]  Dept: [phone]  and follow the prompts.  Office hours are 8:00 a.m. to 4:30 p.m. Monday - Friday. Please note that voicemails left after 4:00 p.m. may not be returned until the following business day.  We are closed weekends and major holidays. You have access to a nurse at all times for urgent questions. Please call the main number to the clinic Dept: [phone] and follow the prompts.   For any non-urgent questions, you may also contact your provider using MyChart. We now offer e-Visits for anyone 45 and older to request care online for non-urgent symptoms. For details visit [email or web address].   Also download the MyChart app! Go to the app store, search "MyChart", open the app, select [place], and log in with your MyChart username and password.

## 2024-01-04 NOTE — Progress Notes (Signed)
HEMATOLOGY/ONCOLOGY CLINIC NOTE  Date of Service: 01/04/24   Chief Complaint: Follow-up for continued evaluation and management of multiple myeloma/plasma cell leukemia  HISTORY OF PRESENTING ILLNESS:  Please see previous note for details of initial presentation.  INTERVAL HISTORY:   Andres Fernandez is here for continued evaluation and management of his multiple myeloma and continued Carfilzomib maintenance.    Patient was last seen by me on 11/23/2023 and reported occasional balance issues with some missed steps.  Today, he complains of intermittent pain in his left side near the ribs, with a "shooting" sensation, which began a couple of weeks ago. Patient is unsure of the cause, and denies any known triggers, though he notes that his pain is possibly positional. He denies any abdominal tenderness on palpation.   He reports that he has been engaging in some relatively strenuous activity during work.   Patient has been tolerating Revlimid without any severe toxicities. He denies any concern for diarrhea.   Patient endorsed a cough last night, which resolved since this morning. He denies any fever, chills, night sweats, or change in bowel/urination habits.   Patient notes a previous pneumonia infection in the bottom of his lungs.   Patient reports one episode of epistaxis.   He denies any new bone pains in the spine or neck. His right leg is sometimes bothersome which he believes might be due to weather changes.   Patient reports that he is UTD with his age-appropriate vaccinations, including flu and COVID-19.   REVIEW OF SYSTEMS:    10 Point review of Systems was done is negative except as noted above.   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   Tobacco abuse     SURGICAL HISTORY: Past Surgical History:  Procedure Laterality Date    APPENDECTOMY     FEMUR IM NAIL Right 11/20/2017   Procedure: RIGHT FEMORAL INTRAMEDULLARY (IM) NAIL;  Surgeon: Yolonda Kida, MD;  Location: [facility] OR;  Service: Orthopedics;  Laterality: Right;   IR IMAGING GUIDED PORT INSERTION  08/19/2021    SOCIAL HISTORY: Social History   Socioeconomic History   Marital status: Single    Spouse name: Not on file   Number of children: Not on file   Years of education: Not on file   Highest education level: Not on file  Occupational History   Not on file  Tobacco Use   Smoking status: Every Day    Current packs/day: 0.00    Types: Cigarettes    Last attempt to quit: 11/09/2017    Years since quitting: 6.1   Smokeless tobacco: Never   Tobacco comments:    1 cigarette/day as of 09/25/18  Vaping Use   Vaping status: Never Used  Substance and Sexual Activity   Alcohol use: Yes   Drug use: No   Sexual activity: Not on file  Other Topics Concern   Not on file  Social History Narrative   Not on file   Social Drivers of Health   Financial Resource Strain: Not on file  Food Insecurity: Low Risk  (04/24/2023)   [facility]   Hunger Vital Sign    Worried About Running Out of Food in the Last Year: Never true    Ran Out of Food in the Last Year: Never true  Transportation Needs: Not  on file (04/24/2023)  Physical Activity: Not on file  Stress: Not on file  Social Connections: Not on file  Intimate Partner Violence: Not on file    FAMILY HISTORY: Family History  Problem Relation Age of Onset   COPD Mother    Liver cancer Mother     ALLERGIES:  has no known allergies.  MEDICATIONS:  . Current Outpatient Medications on File Prior to Visit  Medication Sig Dispense Refill   acyclovir (ZOVIRAX) 400 MG tablet Take 1 tablet (400 mg total) by mouth daily. 90 tablet 6   Albuterol Sulfate (PROAIR RESPICLICK) 108 (90 Base) MCG/ACT AEPB Inhale 1-2 puffs into the lungs every 6 (six) hours as needed. 1 each  1   aspirin EC 81 MG tablet Take 1 tablet (81 mg total) by mouth daily. 60 tablet 11   ferrous sulfate 325 (65 FE) MG EC tablet TAKE 1 TABLET BY MOUTH EVERY DAY WITH BREAKFAST (Patient not taking: Reported on 11/23/2023) 90 tablet 1   Fluticasone Propionate, Inhal, (FLUTICASONE PROPIONATE DISKUS) 250 MCG/ACT AEPB TAKE 1 PUFF BY MOUTH EVERY DAY 60 each 1   lenalidomide (REVLIMID) 10 MG capsule Take 1 capsule (10 mg total) by mouth daily. Take 1 capsule (10 mg) by mouth daily for 21 days and then take 7 days off. 21 capsule 0   lidocaine-prilocaine (EMLA) cream Apply 1 application topically as needed. Apply to Port-A-Cath at least 1 hr prior to treatment. 30 g 0   Multiple Vitamins-Minerals (MULTIVITAMIN WITH MINERALS) tablet Take 1 tablet by mouth at bedtime.     nicotine (NICODERM CQ - DOSED IN MG/24 HOURS) 14 mg/24hr patch PLACE 1 PATCH (14 MG TOTAL) ONTO THE SKIN DAILY. 28 patch 1   ondansetron (ZOFRAN) 4 MG tablet Take 2 tablets (8 mg total) by mouth every 8 (eight) hours as needed for nausea. (Patient taking differently: Take 8 mg by mouth every 8 (eight) hours as needed for nausea. For PRN use) 30 tablet 0   Vitamin D, Ergocalciferol, (DRISDOL) 1.25 MG (50000 UNIT) CAPS capsule Take 1 capsule (50,000 Units total) by mouth once a week. (Patient not taking: Reported on 11/23/2023) 12 capsule 6   Current Facility-Administered Medications on File Prior to Visit  Medication Dose Route Frequency Provider Last Rate Last Admin   acetaminophen (TYLENOL) 325 MG tablet            famotidine (PEPCID) 20 MG tablet             PHYSICAL EXAMINATION:  ECOG FS:1 - Symptomatic but completely ambulatory VSS Wt Readings from Last 3 Encounters:  12/07/23 165 lb 6 oz (75 kg)  11/23/23 162 lb (73.5 kg)  11/09/23 164 lb 4 oz (74.5 kg)    GENERAL:alert, in no acute distress and comfortable SKIN: no acute rashes, no significant lesions EYES: conjunctiva are pink and non-injected, sclera anicteric OROPHARYNX:  MMM, no exudates, no oropharyngeal erythema or ulceration NECK: supple, no JVD LYMPH:  no palpable lymphadenopathy in the cervical, axillary or inguinal regions LUNGS: clear to auscultation b/l with normal respiratory effort HEART: regular rate & rhythm ABDOMEN:  normoactive bowel sounds , non tender, not distended. Extremity: no pedal edema PSYCH: alert & oriented x 3 with fluent speech NEURO: no focal motor/sensory deficits   LABORATORY DATA:  I have reviewed the data as listed     Latest Ref Rng & Units 12/21/2023    7:52 AM 12/07/2023    8:22 AM 11/23/2023    9:30 AM  CBC  WBC 4.0 - 10.5 K/uL 7.6  7.4  8.4   Hemoglobin 13.0 - 17.0 g/dL 96.2  95.2  84.1   Hematocrit 39.0 - 52.0 % 40.0  40.6  38.6   Platelets 150 - 400 K/uL 278  235  262    CBC    Component Value Date/Time   WBC 7.6 12/21/2023 0752   WBC 10.1 08/23/2022 0835   RBC 4.52 12/21/2023 0752   HGB 13.8 12/21/2023 0752   HGB 8.3 (L) 12/14/2017 1230   HCT 40.0 12/21/2023 0752   HCT 23.9 (L) 12/14/2017 1230   PLT 278 12/21/2023 0752   PLT 524 (H) 12/14/2017 1230   MCV 88.5 12/21/2023 0752   MCV 88.4 12/14/2017 1230   MCH 30.5 12/21/2023 0752   MCHC 34.5 12/21/2023 0752   RDW 14.7 12/21/2023 0752   RDW 15.3 (H) 12/14/2017 1230   LYMPHSABS 2.5 12/21/2023 0752   LYMPHSABS 1.4 12/14/2017 1230   MONOABS 1.0 12/21/2023 0752   MONOABS 0.6 12/14/2017 1230   EOSABS 0.2 12/21/2023 0752   EOSABS 0.1 12/14/2017 1230   BASOSABS 0.1 12/21/2023 0752   BASOSABS 0.1 12/14/2017 1230        Latest Ref Rng & Units 12/21/2023    7:52 AM 12/07/2023    8:22 AM 11/23/2023    9:30 AM  CMP  Glucose 70 - 99 mg/dL 89  324  87   BUN 8 - 23 mg/dL 28  23  24    Creatinine 0.61 - 1.24 mg/dL 4.01  0.27  2.53   Sodium 135 - 145 mmol/L 142  140  141   Potassium 3.5 - 5.1 mmol/L 4.5  4.1  4.2   Chloride 98 - 111 mmol/L 108  107  108   CO2 22 - 32 mmol/L 26  26  26    Calcium 8.9 - 10.3 mg/dL 9.3  9.3  9.0   Total Protein 6.5 - 8.1  g/dL 6.9  7.1  6.8   Total Bilirubin 0.0 - 1.2 mg/dL 0.4  0.5  0.4   Alkaline Phos 38 - 126 U/L 64  60  64   AST 15 - 41 U/L 11  9  11    ALT 0 - 44 U/L 9  10  9       03/09/20 ([id number]) Bone Marrow Biopsy at [facility]   07/02/2019 Bone Marrow Biopsy ([id number]) at [facility]    08/07/18 BM Bx:        RADIOGRAPHIC STUDIES: I have personally reviewed the radiological images as listed and agreed with the findings in the report. No results found.  ASSESSMENT & PLAN:    68 y.o. 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   Initial BM Bx showed >95% involvement with kappa restricted serum free light chains. 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 MoL Cy-   08/07/18 BM Bx revealed normocellular bone marrow with trilineage hematopoiesis and 1% plasma cells which indicates the pt is in remission 08/06/18 PET/CT revealed  Expansile lytic lesion involving the left iliac bone, unchanged. Lytic lesion involving the sternum, unchanged. While both are suspicious for myeloma, neither lesion is FDG avid. No hypermetabolic osseous lesions in the visualized axial and appendicular skeleton. Please note  that the bilateral lower extremities were not imaged. No suspicious lymphadenopathy. Spleen is normal in size.    Pt began Ninlaro 3mg  maintenance after pt finished 13 cycles of CyBorD and BM bx showed only 1% plasma cells, then resumed CyBorD and held Ninlaro in anticipation of his tandem bone marrow transplants beginning in late December 2019 with Dr. Shara Blazing at [facility]   11/19/18 High dose chemotherapy with Melphalan 140mg /m2 and autologous stem cell rescue  03/20/19 High dose  chemotherapy with Melphalan 200mg /m2 and autologous stem cell rescue. Non-infectious diarrhea, uncomplicated course otherwise.  07/02/2019 PET scan with results revealing "1. No FDG avid bone lesions are identified. 2.  Asymmetric uptake within the right, greater than left, palatine tonsils with mild fullness of the right tonsillar soft tissue. ENT consult vs attention on follow up suggested. 3.  Innumerable mixed sclerotic and lytic lesions throughout the axial and appendicular skeleton without associated hypermetabolic activity. 4.  Ancillary CT findings as above."  07/02/2019 bone marrow biopsy ([id number])with results showing: "Trilineage hematopoieses with maturation. No plasma cell myeloma identified. Mild anemia and No Rouleaux or circulating plasma cells identified in the peripheral blood."  2) s/p Prophylactic IM nailing for rt femur  completed Physical Therapy at [facility].  -patient previously reported improvement and is currently progressively back to full time work.  3) Thrombocytopenia resolved   4) Renal insufficiency --likely related to multiple myeloma.  Remained fairly stable with baseline creatinine of 1.5-2.1.  Creatinine is 1.44 today Plan -maintain follow up with nephrology referral to Dr Arrie [facility] for continued treatment -Pt completed 8 weeks of maverick for hep c with Dr Luciana Axe on 07/08/18.  -Kidney numbers improved. Creatinine in the 1.7-2.2 range   5) h/o tobacco abuse-counseled on smoking cessation -Currently not smoking.   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 Hemoglobin is stable at 12.8  PLAN:  -Discussed lab results on 01/04/24 in detail with patient. CBC showed WBC of 10.8K, hemoglobin of 13.2, and platelets of 208K. -CMP pending at time of clinical visit -his light chains were normal on 12/21/2023 -his lungs did sound clear with no crackles during  physical examination -no clinical sign or lab evidence of progression his plasma cell leukemia/multiple myeloma at this time -continue Revlimid maintainence -continue maintenance Carfilzomib every two weeks with same supportive medications. Continue ASA and Acyclovir. -educated patient that coughing can strain the rib and cause pain -patient likely has muscular skeletal pain from a pinched nerve in the back. Recommend warm compresses to relax the muscles. Advised patient to continue to monitor for any persistent pain or worsened cough -discussed option of using an OTC humidifier during the night to prevention of epistaxis -answered all of patient's questions in detail  FOLLOW-UP: ***  The total time spent in the appointment was *** minutes* .  All of the patient's questions were answered with apparent satisfaction. The patient knows to call the clinic with any problems, questions or concerns.   Wyvonnia Lora MD MS AAHIVMS [facility] Hematology/Oncology Physician [facility]  .*Total Encounter Time as defined by the Centers for Medicare and Medicaid Services includes, in addition to the face-to-face time of a patient visit (documented in the note above) non-face-to-face time: obtaining and reviewing outside history, ordering and reviewing medications, tests or procedures, care coordination (communications with other health care professionals or caregivers) and documentation in the medical record.  I,Mitra Faeizi,acting as a Neurosurgeon for Wyvonnia Lora, MD.,have documented all relevant documentation on the behalf of Wyvonnia Lora, MD,as directed by  Wyvonnia Lora, MD while in the presence of Wyvonnia Lora, MD.  ***

## 2024-01-04 NOTE — Progress Notes (Signed)
Patient seen by Dr. Addison Naegeli are within treatment parameters.  Labs reviewed: and are not all within treatment parameters.   Dr Candise Che aware: CR: 1.55  Per physician team, patient is ready for treatment and there are NO modifications to the treatment plan.

## 2024-01-10 ENCOUNTER — Encounter: Payer: Self-pay | Admitting: Hematology

## 2024-01-10 ENCOUNTER — Other Ambulatory Visit: Payer: Self-pay

## 2024-01-10 DIAGNOSIS — C9010 Plasma cell leukemia not having achieved remission: Secondary | ICD-10-CM

## 2024-01-10 MED ORDER — LENALIDOMIDE 10 MG PO CAPS: 10.0000 mg | ORAL_CAPSULE | Freq: Every day | ORAL | 0 refills | Status: DC

## 2024-01-18 ENCOUNTER — Other Ambulatory Visit: Payer: Self-pay | Admitting: Hematology

## 2024-01-18 ENCOUNTER — Inpatient Hospital Stay: Payer: Medicare Other

## 2024-01-18 ENCOUNTER — Other Ambulatory Visit: Payer: Medicare Other

## 2024-01-18 ENCOUNTER — Inpatient Hospital Stay: Payer: Medicare Other | Attending: Hematology

## 2024-01-18 ENCOUNTER — Ambulatory Visit: Payer: Medicare Other

## 2024-01-18 VITALS — BP 145/94 | HR 84 | Temp 98.9°F | Resp 16 | Wt 157.5 lb

## 2024-01-18 DIAGNOSIS — Z5111 Encounter for antineoplastic chemotherapy: Secondary | ICD-10-CM | POA: Diagnosis present

## 2024-01-18 DIAGNOSIS — D630 Anemia in neoplastic disease: Secondary | ICD-10-CM | POA: Diagnosis not present

## 2024-01-18 DIAGNOSIS — Z7189 Other specified counseling: Secondary | ICD-10-CM

## 2024-01-18 DIAGNOSIS — N289 Disorder of kidney and ureter, unspecified: Secondary | ICD-10-CM | POA: Diagnosis not present

## 2024-01-18 DIAGNOSIS — C9010 Plasma cell leukemia not having achieved remission: Secondary | ICD-10-CM

## 2024-01-18 DIAGNOSIS — F17210 Nicotine dependence, cigarettes, uncomplicated: Secondary | ICD-10-CM | POA: Diagnosis not present

## 2024-01-18 DIAGNOSIS — C9011 Plasma cell leukemia in remission: Secondary | ICD-10-CM | POA: Insufficient documentation

## 2024-01-18 DIAGNOSIS — Z95828 Presence of other vascular implants and grafts: Secondary | ICD-10-CM

## 2024-01-18 DIAGNOSIS — C9001 Multiple myeloma in remission: Secondary | ICD-10-CM | POA: Insufficient documentation

## 2024-01-18 DIAGNOSIS — Z800 Family history of malignant neoplasm of digestive organs: Secondary | ICD-10-CM | POA: Insufficient documentation

## 2024-01-18 DIAGNOSIS — M84551A Pathological fracture in neoplastic disease, right femur, initial encounter for fracture: Secondary | ICD-10-CM

## 2024-01-18 LAB — CBC WITH DIFFERENTIAL (CANCER CENTER ONLY)
Abs Immature Granulocytes: 0.05 10*3/uL (ref 0.00–0.07)
Basophils Absolute: 0.2 10*3/uL — ABNORMAL HIGH (ref 0.0–0.1)
Basophils Relative: 2 %
Eosinophils Absolute: 0.1 10*3/uL (ref 0.0–0.5)
Eosinophils Relative: 1 %
HCT: 39 % (ref 39.0–52.0)
Hemoglobin: 13.2 g/dL (ref 13.0–17.0)
Immature Granulocytes: 1 %
Lymphocytes Relative: 34 %
Lymphs Abs: 3 10*3/uL (ref 0.7–4.0)
MCH: 30.6 pg (ref 26.0–34.0)
MCHC: 33.8 g/dL (ref 30.0–36.0)
MCV: 90.3 fL (ref 80.0–100.0)
Monocytes Absolute: 0.6 10*3/uL (ref 0.1–1.0)
Monocytes Relative: 7 %
Neutro Abs: 5 10*3/uL (ref 1.7–7.7)
Neutrophils Relative %: 55 %
Platelet Count: 477 10*3/uL — ABNORMAL HIGH (ref 150–400)
RBC: 4.32 MIL/uL (ref 4.22–5.81)
RDW: 14.8 % (ref 11.5–15.5)
WBC Count: 9 10*3/uL (ref 4.0–10.5)
nRBC: 0 % (ref 0.0–0.2)

## 2024-01-18 LAB — CMP (CANCER CENTER ONLY)
ALT: 11 U/L (ref 0–44)
AST: 9 U/L — ABNORMAL LOW (ref 15–41)
Albumin: 4 g/dL (ref 3.5–5.0)
Alkaline Phosphatase: 89 U/L (ref 38–126)
Anion gap: 8 (ref 5–15)
BUN: 22 mg/dL (ref 8–23)
CO2: 26 mmol/L (ref 22–32)
Calcium: 9.2 mg/dL (ref 8.9–10.3)
Chloride: 108 mmol/L (ref 98–111)
Creatinine: 1.4 mg/dL — ABNORMAL HIGH (ref 0.61–1.24)
GFR, Estimated: 55 mL/min — ABNORMAL LOW (ref >60)
Glucose, Bld: 99 mg/dL (ref 70–99)
Potassium: 4.3 mmol/L (ref 3.5–5.1)
Sodium: 142 mmol/L (ref 135–145)
Total Bilirubin: 0.4 mg/dL (ref 0.0–1.2)
Total Protein: 7.2 g/dL (ref 6.5–8.1)

## 2024-01-18 MED ORDER — ACETAMINOPHEN 325 MG PO TABS
650.0000 mg | ORAL_TABLET | Freq: Once | ORAL | Status: AC
  Administered 2024-01-18: 650 mg via ORAL
  Filled 2024-01-18: qty 2

## 2024-01-18 MED ORDER — DEXAMETHASONE SODIUM PHOSPHATE 10 MG/ML IJ SOLN
6.0000 mg | Freq: Once | INTRAMUSCULAR | Status: AC
  Administered 2024-01-18: 6 mg via INTRAVENOUS
  Filled 2024-01-18: qty 1

## 2024-01-18 MED ORDER — LIDOCAINE-PRILOCAINE 2.5-2.5 % EX CREA: 1.0000 | TOPICAL_CREAM | CUTANEOUS | 0 refills | Status: AC | PRN

## 2024-01-18 MED ORDER — HEPARIN SOD (PORK) LOCK FLUSH 100 UNIT/ML IV SOLN
500.0000 [IU] | Freq: Once | INTRAVENOUS | Status: AC | PRN
  Administered 2024-01-18: 500 [IU]

## 2024-01-18 MED ORDER — SODIUM CHLORIDE 0.9 % IV SOLN: Freq: Once | INTRAVENOUS | Status: AC

## 2024-01-18 MED ORDER — FAMOTIDINE IN NACL 20-0.9 MG/50ML-% IV SOLN
20.0000 mg | Freq: Once | INTRAVENOUS | Status: AC
  Administered 2024-01-18: 20 mg via INTRAVENOUS
  Filled 2024-01-18: qty 50

## 2024-01-18 MED ORDER — SODIUM CHLORIDE 0.9% FLUSH
10.0000 mL | Freq: Once | INTRAVENOUS | Status: AC
  Administered 2024-01-18: 10 mL

## 2024-01-18 MED ORDER — DEXTROSE 5 % IV SOLN
56.0000 mg/m2 | Freq: Once | INTRAVENOUS | Status: AC
  Administered 2024-01-18: 100 mg via INTRAVENOUS
  Filled 2024-01-18: qty 30

## 2024-01-18 MED ORDER — SODIUM CHLORIDE 0.9% FLUSH
10.0000 mL | INTRAVENOUS | Status: DC | PRN
  Administered 2024-01-18: 10 mL

## 2024-01-18 NOTE — Progress Notes (Signed)
 Pt. Here for port/labs.  Need lidocaine  cream called into [facility] [address], [place], [place].  Secure chatted Dr. Salomon Cree and Beth/RN

## 2024-01-18 NOTE — Patient Instructions (Signed)
 [facility]  Discharge Instructions: Thank you for choosing [facility] to provide your oncology and hematology care.   If you have a lab appointment with the Cancer Center, please go directly to the Cancer Center and check in at the registration area.   Wear comfortable clothing and clothing appropriate for easy access to any Portacath or PICC line.   We strive to give you quality time with your provider. You may need to reschedule your appointment if you arrive late (15 or more minutes).  Arriving late affects you and other patients whose appointments are after yours.  Also, if you miss three or more appointments without notifying the office, you may be dismissed from the clinic at the provider's discretion.      For prescription refill requests, have your pharmacy contact our office and allow 72 hours for refills to be completed.    Today you received the following chemotherapy and/or immunotherapy agents: carfilzomib      To help prevent nausea and vomiting after your treatment, we encourage you to take your nausea medication as directed.  BELOW ARE SYMPTOMS THAT SHOULD BE REPORTED IMMEDIATELY: *FEVER GREATER THAN 100.4 F (38 C) OR HIGHER *CHILLS OR SWEATING *NAUSEA AND VOMITING THAT IS NOT CONTROLLED WITH YOUR NAUSEA MEDICATION *UNUSUAL SHORTNESS OF BREATH *UNUSUAL BRUISING OR BLEEDING *URINARY PROBLEMS (pain or burning when urinating, or frequent urination) *BOWEL PROBLEMS (unusual diarrhea, constipation, pain near the anus) TENDERNESS IN MOUTH AND THROAT WITH OR WITHOUT PRESENCE OF ULCERS (sore throat, sores in mouth, or a toothache) UNUSUAL RASH, SWELLING OR PAIN  UNUSUAL VAGINAL DISCHARGE OR ITCHING   Items with * indicate a potential emergency and should be followed up as soon as possible or go to the Emergency Department if any problems should occur.  Please show the CHEMOTHERAPY ALERT CARD or  IMMUNOTHERAPY ALERT CARD at check-in to the Emergency Department and triage nurse.  Should you have questions after your visit or need to cancel or reschedule your appointment, please contact [facility]  Dept: [phone]  and follow the prompts.  Office hours are 8:00 a.m. to 4:30 p.m. Monday - Friday. Please note that voicemails left after 4:00 p.m. may not be returned until the following business day.  We are closed weekends and major holidays. You have access to a nurse at all times for urgent questions. Please call the main number to the clinic Dept: [phone] and follow the prompts.   For any non-urgent questions, you may also contact your provider using MyChart. We now offer e-Visits for anyone 73 and older to request care online for non-urgent symptoms. For details visit [email or web address].   Also download the MyChart app! Go to the app store, search "MyChart", open the app, select [place], and log in with your MyChart username and password.

## 2024-01-21 LAB — KAPPA/LAMBDA LIGHT CHAINS
Kappa free light chain: 23.8 mg/L — ABNORMAL HIGH (ref 3.3–19.4)
Kappa, lambda light chain ratio: 1.2 (ref 0.26–1.65)
Lambda free light chains: 19.8 mg/L (ref 5.7–26.3)

## 2024-01-31 NOTE — Progress Notes (Signed)
 HEMATOLOGY/ONCOLOGY CLINIC NOTE  Date of Service: 02/01/2024  Chief Complaint: Follow-up for continued evaluation and management of multiple myeloma/plasma cell leukemia  HISTORY OF PRESENTING ILLNESS:  Please see previous note for details of initial presentation.  INTERVAL HISTORY:   Andres Fernandez is here for continued evaluation and management of his multiple myeloma and continued Carfilzomib maintenance.    Patient was last seen by me on 01/04/2024 and complained of intermittent pain in his left side near the ribs, one cough episode, one episode of epistaxis, and discomfort in the right leg sometimes.   He reports having a respiratory infection less than 2 weeks ago with fever of 104 degrees F. Patient did also endorse chills, SOB, cough, sluggishness, and headaches. His symptoms presented 3 days after receiving treatment. He has no fever at this time.   Patient tested negative for COVID-19/Flu. He presented to [place] urgent care two days after developing symptoms and was treated with an antibiotic.  Patient reports that it typically takes him 1-1.5 weeks to recover from a respiratory infection in general.  He reports that he sometimes forces himself to eat though he has normal eating habits. Patient reports taste buds issues for 2-3 days. He does stay well-hydrated.   At this time, he continues to have clear phlegm and reports that his cough improved. He does use Albuterol sometimes. He denies any back pain or leg swelling.   Patient continues to take Revlimid and denies any toxicity issues.   Patient had an episode of sudden sharp pain in his left side, likely from a pinched nerve in the back.  Patient reports that he is no longer planning to go on a cruise in August.   REVIEW OF SYSTEMS:    10 Point review of Systems was done is negative except as noted above.   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   Tobacco abuse     SURGICAL HISTORY: Past Surgical History:  Procedure Laterality Date   APPENDECTOMY     FEMUR IM NAIL Right 11/20/2017   Procedure: RIGHT FEMORAL INTRAMEDULLARY (IM) NAIL;  Surgeon: Yolonda Kida, MD;  Location: [facility] OR;  Service: Orthopedics;  Laterality: Right;   IR IMAGING GUIDED PORT INSERTION  08/19/2021    SOCIAL HISTORY: Social History   Socioeconomic History   Marital status: Single    Spouse name: Not on file   Number of children: Not on file   Years of education: Not on file   Highest education level: Not on file  Occupational History   Not on file  Tobacco Use   Smoking status: Every Day    Current packs/day: 0.00    Types: Cigarettes    Last attempt to quit: 11/09/2017    Years since quitting: 6.2   Smokeless tobacco: Never   Tobacco comments:    1 cigarette/day as of 09/25/18  Vaping Use   Vaping status: Never Used  Substance and Sexual Activity   Alcohol use: Yes   Drug use: No   Sexual activity: Not on file  Other Topics Concern   Not on file  Social History Narrative   Not on file   Social Drivers of Health   Financial Resource Strain: Not on file  Food Insecurity: Low Risk  (04/24/2023)   [facility]   Hunger  Vital Sign    Worried About Programme researcher, broadcasting/film/video in the Last Year: Never true    Ran Out of Food in the Last Year: Never true  Transportation Needs: Not on file (04/24/2023)  Physical Activity: Not on file  Stress: Not on file  Social Connections: Not on file  Intimate Partner Violence: Not on file    FAMILY HISTORY: Family History  Problem Relation Age of Onset   COPD Mother    Liver cancer Mother     ALLERGIES:  has no known allergies.  MEDICATIONS:  . Current Outpatient Medications on File Prior to Visit  Medication Sig Dispense Refill   acyclovir (ZOVIRAX) 400 MG tablet Take 1 tablet (400  mg total) by mouth daily. 90 tablet 6   Albuterol Sulfate (PROAIR RESPICLICK) 108 (90 Base) MCG/ACT AEPB Inhale 1-2 puffs into the lungs every 6 (six) hours as needed. 1 each 1   aspirin EC 81 MG tablet Take 1 tablet (81 mg total) by mouth daily. 60 tablet 11   ferrous sulfate 325 (65 FE) MG EC tablet TAKE 1 TABLET BY MOUTH EVERY DAY WITH BREAKFAST (Patient not taking: Reported on 11/23/2023) 90 tablet 1   Fluticasone Propionate, Inhal, (FLUTICASONE PROPIONATE DISKUS) 250 MCG/ACT AEPB TAKE 1 PUFF BY MOUTH EVERY DAY 60 each 1   lenalidomide (REVLIMID) 10 MG capsule Take 1 capsule (10 mg total) by mouth daily. Take 1 capsule (10 mg) by mouth daily for 21 days and then take 7 days off. 21 capsule 0   lidocaine-prilocaine (EMLA) cream Apply 1 Application topically as needed. Apply to Port-A-Cath at least 1 hr prior to treatment. 30 g 0   Multiple Vitamins-Minerals (MULTIVITAMIN WITH MINERALS) tablet Take 1 tablet by mouth at bedtime.     nicotine (NICODERM CQ - DOSED IN MG/24 HOURS) 14 mg/24hr patch PLACE 1 PATCH (14 MG TOTAL) ONTO THE SKIN DAILY. 28 patch 1   ondansetron (ZOFRAN) 4 MG tablet Take 2 tablets (8 mg total) by mouth every 8 (eight) hours as needed for nausea. (Patient taking differently: Take 8 mg by mouth every 8 (eight) hours as needed for nausea. For PRN use) 30 tablet 0   Vitamin D, Ergocalciferol, (DRISDOL) 1.25 MG (50000 UNIT) CAPS capsule Take 1 capsule (50,000 Units total) by mouth once a week. (Patient not taking: Reported on 11/23/2023) 12 capsule 6   Current Facility-Administered Medications on File Prior to Visit  Medication Dose Route Frequency Provider Last Rate Last Admin   acetaminophen (TYLENOL) 325 MG tablet            famotidine (PEPCID) 20 MG tablet             PHYSICAL EXAMINATION:  ECOG FS:1 - Symptomatic but completely ambulatory .There were no vitals taken for this visit.  Wt Readings from Last 3 Encounters:  01/18/24 157 lb 8 oz (71.4 kg)  01/04/24 161 lb  11.2 oz (73.3 kg)  12/07/23 165 lb 6 oz (75 kg)    GENERAL:alert, in no acute distress and comfortable SKIN: no acute rashes, no significant lesions EYES: conjunctiva are pink and non-injected, sclera anicteric OROPHARYNX: MMM, no exudates, no oropharyngeal erythema or ulceration NECK: supple, no JVD LYMPH:  no palpable lymphadenopathy in the cervical, axillary or inguinal regions LUNGS: clear to auscultation b/l with normal respiratory effort HEART: regular rate & rhythm ABDOMEN:  normoactive bowel sounds , non tender, not distended. Extremity: no pedal edema PSYCH: alert & oriented x 3 with fluent speech NEURO: no focal  motor/sensory deficits   LABORATORY DATA:  I have reviewed the data as listed     Latest Ref Rng & Units 01/18/2024    7:59 AM 01/04/2024    9:52 AM 12/21/2023    7:52 AM  CBC  WBC 4.0 - 10.5 K/uL 9.0  10.8  7.6   Hemoglobin 13.0 - 17.0 g/dL 16.1  09.6  04.5   Hematocrit 39.0 - 52.0 % 39.0  38.8  40.0   Platelets 150 - 400 K/uL 477  208  278    CBC    Component Value Date/Time   WBC 9.0 01/18/2024 0759   WBC 10.1 08/23/2022 0835   RBC 4.32 01/18/2024 0759   HGB 13.2 01/18/2024 0759   HGB 8.3 (L) 12/14/2017 1230   HCT 39.0 01/18/2024 0759   HCT 23.9 (L) 12/14/2017 1230   PLT 477 (H) 01/18/2024 0759   PLT 524 (H) 12/14/2017 1230   MCV 90.3 01/18/2024 0759   MCV 88.4 12/14/2017 1230   MCH 30.6 01/18/2024 0759   MCHC 33.8 01/18/2024 0759   RDW 14.8 01/18/2024 0759   RDW 15.3 (H) 12/14/2017 1230   LYMPHSABS 3.0 01/18/2024 0759   LYMPHSABS 1.4 12/14/2017 1230   MONOABS 0.6 01/18/2024 0759   MONOABS 0.6 12/14/2017 1230   EOSABS 0.1 01/18/2024 0759   EOSABS 0.1 12/14/2017 1230   BASOSABS 0.2 (H) 01/18/2024 0759   BASOSABS 0.1 12/14/2017 1230        Latest Ref Rng & Units 01/18/2024    7:59 AM 01/04/2024    9:52 AM 12/21/2023    7:52 AM  CMP  Glucose 70 - 99 mg/dL 99  91  89   BUN 8 - 23 mg/dL 22  24  28    Creatinine 0.61 - 1.24 mg/dL 4.09  8.11   9.14   Sodium 135 - 145 mmol/L 142  139  142   Potassium 3.5 - 5.1 mmol/L 4.3  4.1  4.5   Chloride 98 - 111 mmol/L 108  106  108   CO2 22 - 32 mmol/L 26  26  26    Calcium 8.9 - 10.3 mg/dL 9.2  9.0  9.3   Total Protein 6.5 - 8.1 g/dL 7.2  7.0  6.9   Total Bilirubin 0.0 - 1.2 mg/dL 0.4  0.7  0.4   Alkaline Phos 38 - 126 U/L 89  58  64   AST 15 - 41 U/L 9  10  11    ALT 0 - 44 U/L 11  10  9       03/09/20 ([id number]) Bone Marrow Biopsy at [facility]   07/02/2019 Bone Marrow Biopsy ([id number]) at [facility]    08/07/18 BM Bx:        RADIOGRAPHIC STUDIES: I have personally reviewed the radiological images as listed and agreed with the findings in the report. No results found.  ASSESSMENT & PLAN:    68 y.o. 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   Initial BM Bx showed >95% involvement with kappa restricted serum free light chains. 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 MoL Cy-   08/07/18 BM Bx revealed normocellular bone marrow with trilineage hematopoiesis and 1% plasma cells which indicates the pt  is in remission 08/06/18 PET/CT revealed  Expansile lytic lesion involving the left iliac bone, unchanged. Lytic lesion involving the sternum, unchanged. While both are suspicious for myeloma, neither lesion is FDG avid. No hypermetabolic osseous lesions in the visualized axial and appendicular skeleton. Please note that the bilateral lower extremities were not imaged. No suspicious lymphadenopathy. Spleen is normal in size.    Pt began Ninlaro 3mg  maintenance after pt finished 13 cycles of CyBorD and BM bx showed only 1% plasma cells, then resumed CyBorD and held Ninlaro  in anticipation of his tandem bone marrow transplants beginning in late December 2019 with Dr. Shara Blazing at [facility]   11/19/18 High dose chemotherapy with Melphalan 140mg /m2 and autologous stem cell rescue  03/20/19 High dose chemotherapy with Melphalan 200mg /m2 and autologous stem cell rescue. Non-infectious diarrhea, uncomplicated course otherwise.  07/02/2019 PET scan with results revealing "1. No FDG avid bone lesions are identified. 2.  Asymmetric uptake within the right, greater than left, palatine tonsils with mild fullness of the right tonsillar soft tissue. ENT consult vs attention on follow up suggested. 3.  Innumerable mixed sclerotic and lytic lesions throughout the axial and appendicular skeleton without associated hypermetabolic activity. 4.  Ancillary CT findings as above."  07/02/2019 bone marrow biopsy ([id number])with results showing: "Trilineage hematopoieses with maturation. No plasma cell myeloma identified. Mild anemia and No Rouleaux or circulating plasma cells identified in the peripheral blood."  2) s/p Prophylactic IM nailing for rt femur  completed [facility] at the [facility].  -patient previously reported improvement and is currently progressively back to full time work.  3) Thrombocytopenia resolved   4) Renal insufficiency --likely related to multiple myeloma.  Remained fairly stable with baseline creatinine of 1.5-2.1.  Creatinine is 1.44 today Plan -maintain follow up with nephrology referral to Dr Arrie [facility] for continued treatment -Pt completed 8 weeks of maverick for hep c with Dr Luciana Axe on 07/08/18.  -Kidney numbers improved. Creatinine in the 1.7-2.2 range   5) h/o tobacco abuse-counseled on smoking cessation -Currently not smoking.   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 Hemoglobin is stable at 12.8  PLAN:  -Discussed lab results on  02/01/2024 in detail with patient. CBC stable, showed WBC of 7.5K, hemoglobin of 13.3, and platelets of 259K. -CMP stable -K/L light chains normal -educated patient that if Carfilzomib were to cause a fever, it would generally occur immediately the same day of treatment. This is also unlikely given that patient has been receiving Carfilzomib for a while -discussed that it is possible he had a COVID-19 infection which was not picked up on testing -Patient has been tolerating Revlimid without any severe toxicities.  -will hold treatment for a couple weeks to allow patient to completely recover from his respiratory infection and to not over-suppress his immune system so as to not increase the risk of a secondary infection.  -we will plan to restart treatment in two weeks if there are no new concerns -recommend patient to continue to stay UTD with age-appropriate vaccines and avoid crowds -advised patient to let us know immediately if his respiratory infection returns -answered all of patient's questions in detail  FOLLOW-UP: -Holding treatment today -he will continue Carfilzomib in 2 weeks from now -MD visit in 6-8 weeks  The total time spent in the appointment was *** minutes* .  All of the patient's questions were answered with apparent satisfaction.  The patient knows to call the clinic with any problems, questions or concerns.   Wyvonnia Lora MD MS AAHIVMS [facility] Hematology/Oncology Physician [facility]  .*Total Encounter Time as defined by the Centers for Medicare and Medicaid Services includes, in addition to the face-to-face time of a patient visit (documented in the note above) non-face-to-face time: obtaining and reviewing outside history, ordering and reviewing medications, tests or procedures, care coordination (communications with other health care professionals or caregivers) and documentation in the medical record.     I,Mitra Faeizi,acting as a Neurosurgeon for Wyvonnia Lora, MD.,have documented all relevant documentation on the behalf of Wyvonnia Lora, MD,as directed by  Wyvonnia Lora, MD while in the presence of Wyvonnia Lora, MD.  ***

## 2024-02-01 ENCOUNTER — Telehealth: Payer: Self-pay | Admitting: Hematology

## 2024-02-01 ENCOUNTER — Inpatient Hospital Stay (HOSPITAL_BASED_OUTPATIENT_CLINIC_OR_DEPARTMENT_OTHER): Payer: Medicare Other | Admitting: Hematology

## 2024-02-01 ENCOUNTER — Inpatient Hospital Stay: Payer: Medicare Other

## 2024-02-01 VITALS — BP 118/70 | HR 87 | Temp 97.9°F | Resp 17 | Ht 68.0 in | Wt 162.2 lb

## 2024-02-01 DIAGNOSIS — Z7189 Other specified counseling: Secondary | ICD-10-CM

## 2024-02-01 DIAGNOSIS — Z5111 Encounter for antineoplastic chemotherapy: Secondary | ICD-10-CM | POA: Diagnosis not present

## 2024-02-01 DIAGNOSIS — C9010 Plasma cell leukemia not having achieved remission: Secondary | ICD-10-CM

## 2024-02-01 DIAGNOSIS — M84551A Pathological fracture in neoplastic disease, right femur, initial encounter for fracture: Secondary | ICD-10-CM

## 2024-02-01 DIAGNOSIS — Z95828 Presence of other vascular implants and grafts: Secondary | ICD-10-CM

## 2024-02-01 LAB — CMP (CANCER CENTER ONLY)
ALT: 9 U/L (ref 0–44)
AST: 11 U/L — ABNORMAL LOW (ref 15–41)
Albumin: 4.2 g/dL (ref 3.5–5.0)
Alkaline Phosphatase: 67 U/L (ref 38–126)
Anion gap: 7 (ref 5–15)
BUN: 24 mg/dL — ABNORMAL HIGH (ref 8–23)
CO2: 26 mmol/L (ref 22–32)
Calcium: 9.3 mg/dL (ref 8.9–10.3)
Chloride: 107 mmol/L (ref 98–111)
Creatinine: 1.51 mg/dL — ABNORMAL HIGH (ref 0.61–1.24)
GFR, Estimated: 51 mL/min — ABNORMAL LOW (ref >60)
Glucose, Bld: 104 mg/dL — ABNORMAL HIGH (ref 70–99)
Potassium: 3.8 mmol/L (ref 3.5–5.1)
Sodium: 140 mmol/L (ref 135–145)
Total Bilirubin: 0.6 mg/dL (ref 0.0–1.2)
Total Protein: 6.8 g/dL (ref 6.5–8.1)

## 2024-02-01 LAB — CBC WITH DIFFERENTIAL (CANCER CENTER ONLY)
Abs Immature Granulocytes: 0.04 10*3/uL (ref 0.00–0.07)
Basophils Absolute: 0.1 10*3/uL (ref 0.0–0.1)
Basophils Relative: 2 %
Eosinophils Absolute: 0.4 10*3/uL (ref 0.0–0.5)
Eosinophils Relative: 5 %
HCT: 39.4 % (ref 39.0–52.0)
Hemoglobin: 13.3 g/dL (ref 13.0–17.0)
Immature Granulocytes: 1 %
Lymphocytes Relative: 27 %
Lymphs Abs: 2 10*3/uL (ref 0.7–4.0)
MCH: 30.5 pg (ref 26.0–34.0)
MCHC: 33.8 g/dL (ref 30.0–36.0)
MCV: 90.4 fL (ref 80.0–100.0)
Monocytes Absolute: 1.2 10*3/uL — ABNORMAL HIGH (ref 0.1–1.0)
Monocytes Relative: 16 %
Neutro Abs: 3.7 10*3/uL (ref 1.7–7.7)
Neutrophils Relative %: 49 %
Platelet Count: 259 10*3/uL (ref 150–400)
RBC: 4.36 MIL/uL (ref 4.22–5.81)
RDW: 16.3 % — ABNORMAL HIGH (ref 11.5–15.5)
WBC Count: 7.5 10*3/uL (ref 4.0–10.5)
nRBC: 0 % (ref 0.0–0.2)

## 2024-02-01 MED ORDER — SODIUM CHLORIDE 0.9% FLUSH
10.0000 mL | Freq: Once | INTRAVENOUS | Status: AC
  Administered 2024-02-01: 10 mL

## 2024-02-01 NOTE — Progress Notes (Signed)
Patient seen by Dr. Addison Naegeli are within treatment parameters.  Labs reviewed: and are not all within treatment parameters.    Per physician team, patient will not be receiving treatment today. Pt is recovering from an infection.

## 2024-02-01 NOTE — Telephone Encounter (Signed)
 Spoke with patient confirming upcoming appointment

## 2024-02-02 ENCOUNTER — Other Ambulatory Visit: Payer: Self-pay

## 2024-02-06 ENCOUNTER — Other Ambulatory Visit: Payer: Self-pay | Admitting: Hematology

## 2024-02-06 ENCOUNTER — Other Ambulatory Visit: Payer: Self-pay

## 2024-02-06 DIAGNOSIS — C9010 Plasma cell leukemia not having achieved remission: Secondary | ICD-10-CM

## 2024-02-07 ENCOUNTER — Encounter: Payer: Self-pay | Admitting: Hematology

## 2024-02-14 ENCOUNTER — Other Ambulatory Visit: Payer: Self-pay

## 2024-02-14 DIAGNOSIS — C9010 Plasma cell leukemia not having achieved remission: Secondary | ICD-10-CM

## 2024-02-14 MED ORDER — LENALIDOMIDE 10 MG PO CAPS
10.0000 mg | ORAL_CAPSULE | Freq: Every day | ORAL | 0 refills | Status: DC
Start: 2024-02-14 — End: 2024-03-11

## 2024-02-15 ENCOUNTER — Inpatient Hospital Stay: Payer: Medicare Other | Attending: Hematology

## 2024-02-15 ENCOUNTER — Inpatient Hospital Stay: Payer: Medicare Other

## 2024-02-15 VITALS — BP 135/80 | HR 72 | Temp 97.9°F | Resp 18 | Wt 159.8 lb

## 2024-02-15 DIAGNOSIS — C9001 Multiple myeloma in remission: Secondary | ICD-10-CM | POA: Diagnosis not present

## 2024-02-15 DIAGNOSIS — M84551A Pathological fracture in neoplastic disease, right femur, initial encounter for fracture: Secondary | ICD-10-CM

## 2024-02-15 DIAGNOSIS — Z95828 Presence of other vascular implants and grafts: Secondary | ICD-10-CM

## 2024-02-15 DIAGNOSIS — C9010 Plasma cell leukemia not having achieved remission: Secondary | ICD-10-CM

## 2024-02-15 DIAGNOSIS — Z5111 Encounter for antineoplastic chemotherapy: Secondary | ICD-10-CM | POA: Diagnosis present

## 2024-02-15 DIAGNOSIS — C9011 Plasma cell leukemia in remission: Secondary | ICD-10-CM | POA: Insufficient documentation

## 2024-02-15 DIAGNOSIS — Z7189 Other specified counseling: Secondary | ICD-10-CM

## 2024-02-15 LAB — CMP (CANCER CENTER ONLY)
ALT: 9 U/L (ref 0–44)
AST: 11 U/L — ABNORMAL LOW (ref 15–41)
Albumin: 4.3 g/dL (ref 3.5–5.0)
Alkaline Phosphatase: 71 U/L (ref 38–126)
Anion gap: 6 (ref 5–15)
BUN: 23 mg/dL (ref 8–23)
CO2: 25 mmol/L (ref 22–32)
Calcium: 8.9 mg/dL (ref 8.9–10.3)
Chloride: 110 mmol/L (ref 98–111)
Creatinine: 1.53 mg/dL — ABNORMAL HIGH (ref 0.61–1.24)
GFR, Estimated: 50 mL/min — ABNORMAL LOW (ref >60)
Glucose, Bld: 116 mg/dL — ABNORMAL HIGH (ref 70–99)
Potassium: 4 mmol/L (ref 3.5–5.1)
Sodium: 141 mmol/L (ref 135–145)
Total Bilirubin: 0.6 mg/dL (ref 0.0–1.2)
Total Protein: 7 g/dL (ref 6.5–8.1)

## 2024-02-15 LAB — CBC WITH DIFFERENTIAL (CANCER CENTER ONLY)
Abs Immature Granulocytes: 0.02 10*3/uL (ref 0.00–0.07)
Basophils Absolute: 0.1 10*3/uL (ref 0.0–0.1)
Basophils Relative: 1 %
Eosinophils Absolute: 0.3 10*3/uL (ref 0.0–0.5)
Eosinophils Relative: 4 %
HCT: 40.3 % (ref 39.0–52.0)
Hemoglobin: 13.5 g/dL (ref 13.0–17.0)
Immature Granulocytes: 0 %
Lymphocytes Relative: 31 %
Lymphs Abs: 2.2 10*3/uL (ref 0.7–4.0)
MCH: 30.5 pg (ref 26.0–34.0)
MCHC: 33.5 g/dL (ref 30.0–36.0)
MCV: 91 fL (ref 80.0–100.0)
Monocytes Absolute: 0.9 10*3/uL (ref 0.1–1.0)
Monocytes Relative: 13 %
Neutro Abs: 3.5 10*3/uL (ref 1.7–7.7)
Neutrophils Relative %: 51 %
Platelet Count: 240 10*3/uL (ref 150–400)
RBC: 4.43 MIL/uL (ref 4.22–5.81)
RDW: 15.6 % — ABNORMAL HIGH (ref 11.5–15.5)
WBC Count: 7 10*3/uL (ref 4.0–10.5)
nRBC: 0 % (ref 0.0–0.2)

## 2024-02-15 MED ORDER — SODIUM CHLORIDE 0.9 % IV SOLN: Freq: Once | INTRAVENOUS | Status: AC

## 2024-02-15 MED ORDER — DEXTROSE 5 % IV SOLN
56.0000 mg/m2 | Freq: Once | INTRAVENOUS | Status: AC
  Administered 2024-02-15: 100 mg via INTRAVENOUS
  Filled 2024-02-15: qty 30

## 2024-02-15 MED ORDER — SODIUM CHLORIDE 0.9% FLUSH
10.0000 mL | Freq: Once | INTRAVENOUS | Status: AC
  Administered 2024-02-15: 10 mL

## 2024-02-15 MED ORDER — DEXAMETHASONE SODIUM PHOSPHATE 10 MG/ML IJ SOLN
6.0000 mg | Freq: Once | INTRAMUSCULAR | Status: AC
  Administered 2024-02-15: 6 mg via INTRAVENOUS
  Filled 2024-02-15: qty 1

## 2024-02-15 MED ORDER — FAMOTIDINE IN NACL 20-0.9 MG/50ML-% IV SOLN
20.0000 mg | Freq: Once | INTRAVENOUS | Status: AC
  Administered 2024-02-15: 20 mg via INTRAVENOUS
  Filled 2024-02-15: qty 50

## 2024-02-15 MED ORDER — HEPARIN SOD (PORK) LOCK FLUSH 100 UNIT/ML IV SOLN
500.0000 [IU] | Freq: Once | INTRAVENOUS | Status: AC | PRN
  Administered 2024-02-15: 500 [IU]

## 2024-02-15 MED ORDER — SODIUM CHLORIDE 0.9% FLUSH
10.0000 mL | INTRAVENOUS | Status: DC | PRN
  Administered 2024-02-15: 10 mL

## 2024-02-15 MED ORDER — ACETAMINOPHEN 325 MG PO TABS
650.0000 mg | ORAL_TABLET | Freq: Once | ORAL | Status: AC
  Administered 2024-02-15: 650 mg via ORAL
  Filled 2024-02-15: qty 2

## 2024-02-15 NOTE — Patient Instructions (Signed)

## 2024-02-15 NOTE — Progress Notes (Signed)
 Patient seen by  / Not seen by MD today  Vitals are within treatment parameters.  Labs reviewed: and are not all within treatment parameters.   Dr Candise Che aware: 1.53,   Per physician team, patient is ready for treatment and there are NO modifications to the treatment plan.

## 2024-02-18 LAB — KAPPA/LAMBDA LIGHT CHAINS
Kappa free light chain: 23.3 mg/L — ABNORMAL HIGH (ref 3.3–19.4)
Kappa, lambda light chain ratio: 1.19 (ref 0.26–1.65)
Lambda free light chains: 19.6 mg/L (ref 5.7–26.3)

## 2024-02-29 ENCOUNTER — Inpatient Hospital Stay: Payer: Medicare Other

## 2024-02-29 ENCOUNTER — Ambulatory Visit: Payer: Medicare Other | Admitting: Hematology

## 2024-02-29 VITALS — BP 141/83 | HR 73 | Temp 97.7°F | Resp 17 | Wt 156.5 lb

## 2024-02-29 DIAGNOSIS — C9010 Plasma cell leukemia not having achieved remission: Secondary | ICD-10-CM

## 2024-02-29 DIAGNOSIS — M84551A Pathological fracture in neoplastic disease, right femur, initial encounter for fracture: Secondary | ICD-10-CM

## 2024-02-29 DIAGNOSIS — Z7189 Other specified counseling: Secondary | ICD-10-CM

## 2024-02-29 DIAGNOSIS — Z95828 Presence of other vascular implants and grafts: Secondary | ICD-10-CM

## 2024-02-29 DIAGNOSIS — Z5111 Encounter for antineoplastic chemotherapy: Secondary | ICD-10-CM | POA: Diagnosis not present

## 2024-02-29 LAB — CMP (CANCER CENTER ONLY)
ALT: 10 U/L (ref 0–44)
AST: 10 U/L — ABNORMAL LOW (ref 15–41)
Albumin: 4.1 g/dL (ref 3.5–5.0)
Alkaline Phosphatase: 74 U/L (ref 38–126)
Anion gap: 9 (ref 5–15)
BUN: 22 mg/dL (ref 8–23)
CO2: 27 mmol/L (ref 22–32)
Calcium: 9.2 mg/dL (ref 8.9–10.3)
Chloride: 106 mmol/L (ref 98–111)
Creatinine: 1.39 mg/dL — ABNORMAL HIGH (ref 0.61–1.24)
GFR, Estimated: 56 mL/min — ABNORMAL LOW (ref >60)
Glucose, Bld: 103 mg/dL — ABNORMAL HIGH (ref 70–99)
Potassium: 4 mmol/L (ref 3.5–5.1)
Sodium: 142 mmol/L (ref 135–145)
Total Bilirubin: 0.4 mg/dL (ref 0.0–1.2)
Total Protein: 7.1 g/dL (ref 6.5–8.1)

## 2024-02-29 LAB — CBC WITH DIFFERENTIAL (CANCER CENTER ONLY)
Abs Immature Granulocytes: 0.26 10*3/uL — ABNORMAL HIGH (ref 0.00–0.07)
Basophils Absolute: 0.1 10*3/uL (ref 0.0–0.1)
Basophils Relative: 1 %
Eosinophils Absolute: 0.2 10*3/uL (ref 0.0–0.5)
Eosinophils Relative: 2 %
HCT: 40 % (ref 39.0–52.0)
Hemoglobin: 13.7 g/dL (ref 13.0–17.0)
Immature Granulocytes: 2 %
Lymphocytes Relative: 16 %
Lymphs Abs: 2.1 10*3/uL (ref 0.7–4.0)
MCH: 30.6 pg (ref 26.0–34.0)
MCHC: 34.3 g/dL (ref 30.0–36.0)
MCV: 89.5 fL (ref 80.0–100.0)
Monocytes Absolute: 0.7 10*3/uL (ref 0.1–1.0)
Monocytes Relative: 6 %
Neutro Abs: 9.3 10*3/uL — ABNORMAL HIGH (ref 1.7–7.7)
Neutrophils Relative %: 73 %
Platelet Count: 318 10*3/uL (ref 150–400)
RBC: 4.47 MIL/uL (ref 4.22–5.81)
RDW: 15 % (ref 11.5–15.5)
WBC Count: 12.7 10*3/uL — ABNORMAL HIGH (ref 4.0–10.5)
nRBC: 0 % (ref 0.0–0.2)

## 2024-02-29 MED ORDER — SODIUM CHLORIDE 0.9% FLUSH
10.0000 mL | Freq: Once | INTRAVENOUS | Status: AC
  Administered 2024-02-29: 10 mL

## 2024-02-29 MED ORDER — FAMOTIDINE IN NACL 20-0.9 MG/50ML-% IV SOLN
20.0000 mg | Freq: Once | INTRAVENOUS | Status: AC
  Administered 2024-02-29: 20 mg via INTRAVENOUS
  Filled 2024-02-29: qty 50

## 2024-02-29 MED ORDER — SODIUM CHLORIDE 0.9% FLUSH
10.0000 mL | INTRAVENOUS | Status: DC | PRN
  Administered 2024-02-29: 10 mL

## 2024-02-29 MED ORDER — ACETAMINOPHEN 325 MG PO TABS
650.0000 mg | ORAL_TABLET | Freq: Once | ORAL | Status: AC
  Administered 2024-02-29: 650 mg via ORAL
  Filled 2024-02-29: qty 2

## 2024-02-29 MED ORDER — DEXTROSE 5 % IV SOLN
56.0000 mg/m2 | Freq: Once | INTRAVENOUS | Status: AC
  Administered 2024-02-29: 100 mg via INTRAVENOUS
  Filled 2024-02-29: qty 30

## 2024-02-29 MED ORDER — DEXAMETHASONE SODIUM PHOSPHATE 10 MG/ML IJ SOLN
6.0000 mg | Freq: Once | INTRAMUSCULAR | Status: AC
  Administered 2024-02-29: 6 mg via INTRAVENOUS
  Filled 2024-02-29: qty 1

## 2024-02-29 MED ORDER — HEPARIN SOD (PORK) LOCK FLUSH 100 UNIT/ML IV SOLN
500.0000 [IU] | Freq: Once | INTRAVENOUS | Status: AC | PRN
  Administered 2024-02-29: 500 [IU]

## 2024-02-29 MED ORDER — SODIUM CHLORIDE 0.9 % IV SOLN: Freq: Once | INTRAVENOUS | Status: AC

## 2024-02-29 NOTE — Patient Instructions (Signed)
 [facility]  Discharge Instructions: Thank you for choosing [facility] to provide your oncology and hematology care.   If you have a lab appointment with the Cancer Center, please go directly to the Cancer Center and check in at the registration area.   Wear comfortable clothing and clothing appropriate for easy access to any Portacath or PICC line.   We strive to give you quality time with your provider. You may need to reschedule your appointment if you arrive late (15 or more minutes).  Arriving late affects you and other patients whose appointments are after yours.  Also, if you miss three or more appointments without notifying the office, you may be dismissed from the clinic at the provider's discretion.      For prescription refill requests, have your pharmacy contact our office and allow 72 hours for refills to be completed.    Today you received the following chemotherapy and/or immunotherapy agents: carfilzomib      To help prevent nausea and vomiting after your treatment, we encourage you to take your nausea medication as directed.  BELOW ARE SYMPTOMS THAT SHOULD BE REPORTED IMMEDIATELY: *FEVER GREATER THAN 100.4 F (38 C) OR HIGHER *CHILLS OR SWEATING *NAUSEA AND VOMITING THAT IS NOT CONTROLLED WITH YOUR NAUSEA MEDICATION *UNUSUAL SHORTNESS OF BREATH *UNUSUAL BRUISING OR BLEEDING *URINARY PROBLEMS (pain or burning when urinating, or frequent urination) *BOWEL PROBLEMS (unusual diarrhea, constipation, pain near the anus) TENDERNESS IN MOUTH AND THROAT WITH OR WITHOUT PRESENCE OF ULCERS (sore throat, sores in mouth, or a toothache) UNUSUAL RASH, SWELLING OR PAIN  UNUSUAL VAGINAL DISCHARGE OR ITCHING   Items with * indicate a potential emergency and should be followed up as soon as possible or go to the Emergency Department if any problems should occur.  Please show the CHEMOTHERAPY ALERT CARD or  IMMUNOTHERAPY ALERT CARD at check-in to the Emergency Department and triage nurse.  Should you have questions after your visit or need to cancel or reschedule your appointment, please contact [facility]  Dept: [phone]  and follow the prompts.  Office hours are 8:00 a.m. to 4:30 p.m. Monday - Friday. Please note that voicemails left after 4:00 p.m. may not be returned until the following business day.  We are closed weekends and major holidays. You have access to a nurse at all times for urgent questions. Please call the main number to the clinic Dept: [phone] and follow the prompts.   For any non-urgent questions, you may also contact your provider using MyChart. We now offer e-Visits for anyone 73 and older to request care online for non-urgent symptoms. For details visit [email or web address].   Also download the MyChart app! Go to the app store, search "MyChart", open the app, select [place], and log in with your MyChart username and password.

## 2024-02-29 NOTE — Progress Notes (Signed)
 Patient states he cannot stay today for Aredia. Candy Sledge RN informed. States she will get patient scheduled for Aredia next week. Pt aware and agreeable.

## 2024-03-11 ENCOUNTER — Other Ambulatory Visit: Payer: Self-pay | Admitting: Hematology

## 2024-03-11 DIAGNOSIS — C9010 Plasma cell leukemia not having achieved remission: Secondary | ICD-10-CM

## 2024-03-14 ENCOUNTER — Inpatient Hospital Stay: Payer: Medicare Other | Attending: Hematology

## 2024-03-14 ENCOUNTER — Inpatient Hospital Stay: Payer: Medicare Other

## 2024-03-14 VITALS — BP 134/83 | HR 77 | Temp 98.5°F | Resp 16 | Wt 156.0 lb

## 2024-03-14 DIAGNOSIS — R0600 Dyspnea, unspecified: Secondary | ICD-10-CM | POA: Insufficient documentation

## 2024-03-14 DIAGNOSIS — Z7189 Other specified counseling: Secondary | ICD-10-CM

## 2024-03-14 DIAGNOSIS — C9010 Plasma cell leukemia not having achieved remission: Secondary | ICD-10-CM

## 2024-03-14 DIAGNOSIS — M84551A Pathological fracture in neoplastic disease, right femur, initial encounter for fracture: Secondary | ICD-10-CM

## 2024-03-14 DIAGNOSIS — Z5111 Encounter for antineoplastic chemotherapy: Secondary | ICD-10-CM | POA: Insufficient documentation

## 2024-03-14 DIAGNOSIS — C9011 Plasma cell leukemia in remission: Secondary | ICD-10-CM | POA: Insufficient documentation

## 2024-03-14 DIAGNOSIS — C9000 Multiple myeloma not having achieved remission: Secondary | ICD-10-CM | POA: Insufficient documentation

## 2024-03-14 DIAGNOSIS — M25561 Pain in right knee: Secondary | ICD-10-CM | POA: Insufficient documentation

## 2024-03-14 DIAGNOSIS — Z95828 Presence of other vascular implants and grafts: Secondary | ICD-10-CM

## 2024-03-14 DIAGNOSIS — C9001 Multiple myeloma in remission: Secondary | ICD-10-CM | POA: Diagnosis not present

## 2024-03-14 LAB — CMP (CANCER CENTER ONLY)
ALT: 9 U/L (ref 0–44)
AST: 10 U/L — ABNORMAL LOW (ref 15–41)
Albumin: 4 g/dL (ref 3.5–5.0)
Alkaline Phosphatase: 76 U/L (ref 38–126)
Anion gap: 8 (ref 5–15)
BUN: 27 mg/dL — ABNORMAL HIGH (ref 8–23)
CO2: 25 mmol/L (ref 22–32)
Calcium: 9.2 mg/dL (ref 8.9–10.3)
Chloride: 107 mmol/L (ref 98–111)
Creatinine: 1.57 mg/dL — ABNORMAL HIGH (ref 0.61–1.24)
GFR, Estimated: 48 mL/min — ABNORMAL LOW (ref >60)
Glucose, Bld: 86 mg/dL (ref 70–99)
Potassium: 4.1 mmol/L (ref 3.5–5.1)
Sodium: 140 mmol/L (ref 135–145)
Total Bilirubin: 0.5 mg/dL (ref 0.0–1.2)
Total Protein: 7 g/dL (ref 6.5–8.1)

## 2024-03-14 LAB — CBC WITH DIFFERENTIAL (CANCER CENTER ONLY)
Abs Immature Granulocytes: 0.02 10*3/uL (ref 0.00–0.07)
Basophils Absolute: 0.1 10*3/uL (ref 0.0–0.1)
Basophils Relative: 1 %
Eosinophils Absolute: 0.3 10*3/uL (ref 0.0–0.5)
Eosinophils Relative: 5 %
HCT: 36.6 % — ABNORMAL LOW (ref 39.0–52.0)
Hemoglobin: 12.5 g/dL — ABNORMAL LOW (ref 13.0–17.0)
Immature Granulocytes: 0 %
Lymphocytes Relative: 31 %
Lymphs Abs: 2.1 10*3/uL (ref 0.7–4.0)
MCH: 30.9 pg (ref 26.0–34.0)
MCHC: 34.2 g/dL (ref 30.0–36.0)
MCV: 90.4 fL (ref 80.0–100.0)
Monocytes Absolute: 0.9 10*3/uL (ref 0.1–1.0)
Monocytes Relative: 14 %
Neutro Abs: 3.3 10*3/uL (ref 1.7–7.7)
Neutrophils Relative %: 49 %
Platelet Count: 279 10*3/uL (ref 150–400)
RBC: 4.05 MIL/uL — ABNORMAL LOW (ref 4.22–5.81)
RDW: 15.5 % (ref 11.5–15.5)
WBC Count: 6.7 10*3/uL (ref 4.0–10.5)
nRBC: 0 % (ref 0.0–0.2)

## 2024-03-14 MED ORDER — HEPARIN SOD (PORK) LOCK FLUSH 100 UNIT/ML IV SOLN
500.0000 [IU] | Freq: Once | INTRAVENOUS | Status: AC | PRN
  Administered 2024-03-14: 500 [IU]

## 2024-03-14 MED ORDER — FAMOTIDINE IN NACL 20-0.9 MG/50ML-% IV SOLN
20.0000 mg | Freq: Once | INTRAVENOUS | Status: AC
  Administered 2024-03-14: 20 mg via INTRAVENOUS
  Filled 2024-03-14: qty 50

## 2024-03-14 MED ORDER — SODIUM CHLORIDE 0.9 % IV SOLN: Freq: Once | INTRAVENOUS | Status: AC

## 2024-03-14 MED ORDER — SODIUM CHLORIDE 0.9 % IV SOLN
60.0000 mg | Freq: Once | INTRAVENOUS | Status: DC
  Filled 2024-03-14: qty 20
  Filled 2024-03-14: qty 10

## 2024-03-14 MED ORDER — ACETAMINOPHEN 325 MG PO TABS
650.0000 mg | ORAL_TABLET | Freq: Once | ORAL | Status: AC
  Administered 2024-03-14: 650 mg via ORAL
  Filled 2024-03-14: qty 2

## 2024-03-14 MED ORDER — DEXAMETHASONE SODIUM PHOSPHATE 10 MG/ML IJ SOLN
6.0000 mg | Freq: Once | INTRAMUSCULAR | Status: AC
  Administered 2024-03-14: 6 mg via INTRAVENOUS
  Filled 2024-03-14: qty 1

## 2024-03-14 MED ORDER — SODIUM CHLORIDE 0.9% FLUSH
10.0000 mL | INTRAVENOUS | Status: DC | PRN
Start: 2024-03-14 — End: 2024-03-14
  Administered 2024-03-14: 10 mL

## 2024-03-14 MED ORDER — SODIUM CHLORIDE 0.9% FLUSH
10.0000 mL | Freq: Once | INTRAVENOUS | Status: AC
Start: 2024-03-14 — End: 2024-03-14
  Administered 2024-03-14: 10 mL

## 2024-03-14 MED ORDER — DEXTROSE 5 % IV SOLN
56.0000 mg/m2 | Freq: Once | INTRAVENOUS | Status: AC
  Administered 2024-03-14: 100 mg via INTRAVENOUS
  Filled 2024-03-14: qty 30

## 2024-03-14 NOTE — Patient Instructions (Signed)
 [facility] - A DEPT OF [facility]  Discharge Instructions: Thank you for choosing [facility] to provide your oncology and hematology care.   If you have a lab appointment with the Cancer Center, please go directly to the Cancer Center and check in at the registration area.   Wear comfortable clothing and clothing appropriate for easy access to any Portacath or PICC line.   We strive to give you quality time with your provider. You may need to reschedule your appointment if you arrive late (15 or more minutes).  Arriving late affects you and other patients whose appointments are after yours.  Also, if you miss three or more appointments without notifying the office, you may be dismissed from the clinic at the provider's discretion.      For prescription refill requests, have your pharmacy contact our office and allow 72 hours for refills to be completed.    Today you received the following chemotherapy and/or immunotherapy agents: Kyprolis      To help prevent nausea and vomiting after your treatment, we encourage you to take your nausea medication as directed.  BELOW ARE SYMPTOMS THAT SHOULD BE REPORTED IMMEDIATELY: *FEVER GREATER THAN 100.4 F (38 C) OR HIGHER *CHILLS OR SWEATING *NAUSEA AND VOMITING THAT IS NOT CONTROLLED WITH YOUR NAUSEA MEDICATION *UNUSUAL SHORTNESS OF BREATH *UNUSUAL BRUISING OR BLEEDING *URINARY PROBLEMS (pain or burning when urinating, or frequent urination) *BOWEL PROBLEMS (unusual diarrhea, constipation, pain near the anus) TENDERNESS IN MOUTH AND THROAT WITH OR WITHOUT PRESENCE OF ULCERS (sore throat, sores in mouth, or a toothache) UNUSUAL RASH, SWELLING OR PAIN  UNUSUAL VAGINAL DISCHARGE OR ITCHING   Items with * indicate a potential emergency and should be followed up as soon as possible or go to the Emergency Department if any problems should occur.  Please show the CHEMOTHERAPY ALERT CARD or IMMUNOTHERAPY  ALERT CARD at check-in to the Emergency Department and triage nurse.  Should you have questions after your visit or need to cancel or reschedule your appointment, please contact [facility] - A DEPT OF [facility]  Dept: [phone]  and follow the prompts.  Office hours are 8:00 a.m. to 4:30 p.m. Monday - Friday. Please note that voicemails left after 4:00 p.m. may not be returned until the following business day.  We are closed weekends and major holidays. You have access to a nurse at all times for urgent questions. Please call the main number to the clinic Dept: [phone] and follow the prompts.   For any non-urgent questions, you may also contact your provider using MyChart. We now offer e-Visits for anyone 71 and older to request care online for non-urgent symptoms. For details visit [email or web address].   Also download the MyChart app! Go to the app store, search "MyChart", open the app, select [place], and log in with your MyChart username and password.  Pamidronate Injection What is this medication? PAMIDRONATE (pa mi DROE nate) treats high calcium levels in the blood caused by cancer. It may also be used with chemotherapy to treat weakened bones caused by cancer. It can also be used to treat Paget's disease of the bone. It works by slowing down the release of calcium from bones. This lowers calcium levels in your blood. It also makes your bones stronger and less likely to break (fracture). It belongs to a group of medications called bisphosphonates. This medicine may be used for  other purposes; ask your health care provider or pharmacist if you have questions. COMMON BRAND NAME(S): Aredia What should I tell my care team before I take this medication? They need to know if you have any of these conditions: Bleeding disorder Cancer Dental disease Kidney disease Low levels of calcium or other minerals in the blood Low red blood cell  counts Receiving steroids, such as dexamethasone or prednisone An unusual or allergic reaction to pamidronate, other medications, foods, dyes or preservatives Pregnant or trying to get pregnant Breast-feeding How should I use this medication? This medication is injected into a vein. It is given by your care team in a hospital or clinic setting. Talk to your care team about the use of this medication in children. Special care may be needed. Overdosage: If you think you have taken too much of this medicine contact a poison control center or emergency room at once. NOTE: This medicine is only for you. Do not share this medicine with others. What if I miss a dose? Keep appointments for follow-up doses. It is important not to miss your dose. Call your care team if you are unable to keep an appointment. What may interact with this medication? Certain antibiotics given by injection Medications for inflammation or pain, such as ibuprofen, naproxen Some diuretics, such as bumetanide, furosemide Cyclosporine Parathyroid hormone Tacrolimus Teriparatide Thalidomide This list may not describe all possible interactions. Give your health care provider a list of all the medicines, herbs, non-prescription drugs, or dietary supplements you use. Also tell them if you smoke, drink alcohol, or use illegal drugs. Some items may interact with your medicine. What should I watch for while using this medication? Visit your care team for regular checks on your progress. It may be some time before you see the benefit from this medication. Some people who take this medication have severe bone, joint, or muscle pain. This medication may also increase your risk for jaw problems or a broken thigh bone. Tell your care team right away if you have severe pain in your jaw, bones, joints, or muscles. Tell your care team if you have any pain that does not go away or that gets worse. Tell your dentist and dental surgeon that you  are taking this medication. You should not have major dental surgery while on this medication. See your dentist to have a dental exam and fix any dental problems before starting this medication. Take good care of your teeth while on this medication. Make sure you see your dentist for regular follow-up appointments. You should make sure you get enough calcium and vitamin D while you are taking this medication. Discuss the foods you eat and the vitamins you take with your care team. You may need bloodwork while you are taking this medication. Talk to your care team if you wish to become pregnant or think you might be pregnant. This medication can cause serious birth defects. What side effects may I notice from receiving this medication? Side effects that you should report to your care team as soon as possible: Allergic reactions--skin rash, itching, hives, swelling of the face, lips, tongue, or throat Kidney injury--decrease in the amount of urine, swelling of the ankles, hands, or feet Low calcium level--muscle pain or cramps, confusion, tingling, or numbness in the hands or feet Osteonecrosis of the jaw--pain, swelling, or redness in the mouth, numbness of the jaw, poor healing after dental work, unusual discharge from the mouth, visible bones in the mouth Severe bone, joint, or  muscle pain Side effects that usually do not require medical attention (report to your care team if they continue or are bothersome): Constipation Fatigue Fever Loss of appetite Nausea Pain, redness, or irritation at injection site Stomach pain This list may not describe all possible side effects. Call your doctor for medical advice about side effects. You may report side effects to FDA at 1-800-FDA-1088. Where should I keep my medication? This medication is given in a hospital or clinic. It will not be stored at home. NOTE: This sheet is a summary. It may not cover all possible information. If you have questions about  this medicine, talk to your doctor, pharmacist, or health care provider.  2024 Elsevier/Gold Standard (2022-01-16 00:00:00)

## 2024-03-17 LAB — KAPPA/LAMBDA LIGHT CHAINS
Kappa free light chain: 22.4 mg/L — ABNORMAL HIGH (ref 3.3–19.4)
Kappa, lambda light chain ratio: 1.11 (ref 0.26–1.65)
Lambda free light chains: 20.2 mg/L (ref 5.7–26.3)

## 2024-03-26 ENCOUNTER — Telehealth: Payer: Self-pay

## 2024-03-26 NOTE — Telephone Encounter (Signed)
error 

## 2024-03-27 ENCOUNTER — Encounter: Payer: Self-pay | Admitting: Hematology

## 2024-03-28 ENCOUNTER — Inpatient Hospital Stay: Payer: Medicare Other

## 2024-03-28 ENCOUNTER — Inpatient Hospital Stay (HOSPITAL_BASED_OUTPATIENT_CLINIC_OR_DEPARTMENT_OTHER): Payer: Medicare Other | Admitting: Physician Assistant

## 2024-03-28 ENCOUNTER — Ambulatory Visit (HOSPITAL_COMMUNITY)
Admission: RE | Admit: 2024-03-28 | Discharge: 2024-03-28 | Disposition: A | Discharge status: Home / Self Care | Source: Ambulatory Visit | Attending: Physician Assistant | Admitting: Physician Assistant

## 2024-03-28 VITALS — BP 156/88 | HR 72 | Temp 98.3°F | Resp 15 | Wt 155.5 lb

## 2024-03-28 DIAGNOSIS — R0600 Dyspnea, unspecified: Secondary | ICD-10-CM

## 2024-03-28 DIAGNOSIS — M84551A Pathological fracture in neoplastic disease, right femur, initial encounter for fracture: Secondary | ICD-10-CM

## 2024-03-28 DIAGNOSIS — M25561 Pain in right knee: Secondary | ICD-10-CM | POA: Insufficient documentation

## 2024-03-28 DIAGNOSIS — C9010 Plasma cell leukemia not having achieved remission: Secondary | ICD-10-CM

## 2024-03-28 DIAGNOSIS — Z7189 Other specified counseling: Secondary | ICD-10-CM | POA: Diagnosis not present

## 2024-03-28 DIAGNOSIS — Z95828 Presence of other vascular implants and grafts: Secondary | ICD-10-CM

## 2024-03-28 DIAGNOSIS — Z5111 Encounter for antineoplastic chemotherapy: Secondary | ICD-10-CM | POA: Diagnosis not present

## 2024-03-28 LAB — CMP (CANCER CENTER ONLY)
ALT: 10 U/L (ref 0–44)
AST: 10 U/L — ABNORMAL LOW (ref 15–41)
Albumin: 4.1 g/dL (ref 3.5–5.0)
Alkaline Phosphatase: 73 U/L (ref 38–126)
Anion gap: 8 (ref 5–15)
BUN: 18 mg/dL (ref 8–23)
CO2: 25 mmol/L (ref 22–32)
Calcium: 8.9 mg/dL (ref 8.9–10.3)
Chloride: 108 mmol/L (ref 98–111)
Creatinine: 1.43 mg/dL — ABNORMAL HIGH (ref 0.61–1.24)
GFR, Estimated: 54 mL/min — ABNORMAL LOW (ref >60)
Glucose, Bld: 94 mg/dL (ref 70–99)
Potassium: 4.2 mmol/L (ref 3.5–5.1)
Sodium: 141 mmol/L (ref 135–145)
Total Bilirubin: 0.5 mg/dL (ref 0.0–1.2)
Total Protein: 6.9 g/dL (ref 6.5–8.1)

## 2024-03-28 LAB — CBC WITH DIFFERENTIAL (CANCER CENTER ONLY)
Abs Immature Granulocytes: 0.06 10*3/uL (ref 0.00–0.07)
Basophils Absolute: 0.1 10*3/uL (ref 0.0–0.1)
Basophils Relative: 1 %
Eosinophils Absolute: 0.2 10*3/uL (ref 0.0–0.5)
Eosinophils Relative: 2 %
HCT: 37.4 % — ABNORMAL LOW (ref 39.0–52.0)
Hemoglobin: 12.9 g/dL — ABNORMAL LOW (ref 13.0–17.0)
Immature Granulocytes: 1 %
Lymphocytes Relative: 24 %
Lymphs Abs: 2.5 10*3/uL (ref 0.7–4.0)
MCH: 30.1 pg (ref 26.0–34.0)
MCHC: 34.5 g/dL (ref 30.0–36.0)
MCV: 87.2 fL (ref 80.0–100.0)
Monocytes Absolute: 0.7 10*3/uL (ref 0.1–1.0)
Monocytes Relative: 6 %
Neutro Abs: 7 10*3/uL (ref 1.7–7.7)
Neutrophils Relative %: 66 %
Platelet Count: 311 10*3/uL (ref 150–400)
RBC: 4.29 MIL/uL (ref 4.22–5.81)
RDW: 15.1 % (ref 11.5–15.5)
WBC Count: 10.6 10*3/uL — ABNORMAL HIGH (ref 4.0–10.5)
nRBC: 0 % (ref 0.0–0.2)

## 2024-03-28 MED ORDER — SODIUM CHLORIDE 0.9 % IV SOLN: Freq: Once | INTRAVENOUS | Status: AC

## 2024-03-28 MED ORDER — DEXTROSE 5 % IV SOLN
56.0000 mg/m2 | Freq: Once | INTRAVENOUS | Status: AC
  Administered 2024-03-28: 100 mg via INTRAVENOUS
  Filled 2024-03-28: qty 30

## 2024-03-28 MED ORDER — HEPARIN SOD (PORK) LOCK FLUSH 100 UNIT/ML IV SOLN
500.0000 [IU] | Freq: Once | INTRAVENOUS | Status: AC | PRN
Start: 2024-03-28 — End: 2024-03-28
  Administered 2024-03-28: 500 [IU]

## 2024-03-28 MED ORDER — FAMOTIDINE IN NACL 20-0.9 MG/50ML-% IV SOLN
20.0000 mg | Freq: Once | INTRAVENOUS | Status: AC
  Administered 2024-03-28: 20 mg via INTRAVENOUS
  Filled 2024-03-28: qty 50

## 2024-03-28 MED ORDER — SODIUM CHLORIDE 0.9% FLUSH
10.0000 mL | Freq: Once | INTRAVENOUS | Status: AC
  Administered 2024-03-28: 10 mL

## 2024-03-28 MED ORDER — DEXAMETHASONE SODIUM PHOSPHATE 10 MG/ML IJ SOLN
6.0000 mg | Freq: Once | INTRAMUSCULAR | Status: AC
  Administered 2024-03-28: 6 mg via INTRAVENOUS
  Filled 2024-03-28: qty 1

## 2024-03-28 MED ORDER — SODIUM CHLORIDE 0.9% FLUSH
10.0000 mL | INTRAVENOUS | Status: DC | PRN
  Administered 2024-03-28: 10 mL

## 2024-03-28 MED ORDER — SODIUM CHLORIDE 0.9 % IV SOLN
60.0000 mg | Freq: Once | INTRAVENOUS | Status: AC
  Administered 2024-03-28: 60 mg via INTRAVENOUS
  Filled 2024-03-28: qty 10

## 2024-03-28 MED ORDER — ACETAMINOPHEN 325 MG PO TABS
650.0000 mg | ORAL_TABLET | Freq: Once | ORAL | Status: AC
  Administered 2024-03-28: 650 mg via ORAL
  Filled 2024-03-28: qty 2

## 2024-03-28 NOTE — Patient Instructions (Signed)

## 2024-03-28 NOTE — Progress Notes (Signed)
 HEMATOLOGY/ONCOLOGY CLINIC NOTE  Date of Service: 03/28/2024  Follow-up for continued valuation and management of multiple myeloma/plasma cell leukemia  HISTORY OF PRESENTING ILLNESS:  Please see previous note for details of initial presentation.  INTERVAL HISTORY:  Andres Fernandez is here for continued evaluation and management of his multiple myeloma and continued carfilzomib  maintenance.  He was last seen by Dr. Onesimo on 10/13/2022. He is unaccompanied for this visit.   Andres Fernandez reports that his energy levels are fairly stable. He is having nasal congestion and has a productive cough with white sputum.  He denies fevers chills or difficulty with breathing.  He denies nausea, vomiting or bowel habit changes.  He reports having some pain in the right anterior knee without any changes in ambulation.He denies easy bruising or signs of active bleeding. He denies fevers, chills, night sweats, shortness of breath, chest pain. He has no other complaints.   REVIEW OF SYSTEMS:   10 Point review of Systems was done is negative except as noted above.  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   Tobacco abuse     SURGICAL HISTORY: Past Surgical History:  Procedure Laterality Date   APPENDECTOMY     FEMUR IM NAIL Right 11/20/2017   Procedure: RIGHT FEMORAL INTRAMEDULLARY (IM) NAIL;  Surgeon: Sharl Selinda Dover, MD;  Location: [facility] OR;  Service: Orthopedics;  Laterality: Right;   IR IMAGING GUIDED PORT INSERTION  08/19/2021    SOCIAL HISTORY: Social History   Socioeconomic History   Marital status: Single    Spouse name: Not on file   Number of children: Not on file   Years of education: Not on file   Highest education level: Not on file  Occupational History   Not on file  Tobacco Use   Smoking status: Every Day    Current packs/day: 0.00     Types: Cigarettes    Last attempt to quit: 11/09/2017    Years since quitting: 6.3   Smokeless tobacco: Never   Tobacco comments:    1 cigarette/day as of 09/25/18  Vaping Use   Vaping status: Never Used  Substance and Sexual Activity   Alcohol use: Yes   Drug use: No   Sexual activity: Not on file  Other Topics Concern   Not on file  Social History Narrative   Not on file   Social Drivers of Health   Financial Resource Strain: Not on file  Food Insecurity: Low Risk  (04/24/2023)   [facility]   Hunger Vital Sign    Worried About Running Out of Food in the Last Year: Never true    Ran Out of Food in the Last Year: Never true  Transportation Needs: Not on file (04/24/2023)  Physical Activity: Not on file  Stress: Not on file  Social Connections: Not on file  Intimate Partner Violence: Not on file    FAMILY HISTORY: Family History  Problem Relation Age of Onset   COPD Mother    Liver cancer Mother     ALLERGIES:  has no known allergies.  MEDICATIONS:  . Current Outpatient Medications on File Prior to Visit  Medication Sig Dispense Refill   acyclovir  (ZOVIRAX ) 400 MG tablet Take 1 tablet (400 mg total) by mouth daily. 90 tablet 6   Albuterol  Sulfate (PROAIR  RESPICLICK) 108 (  90 Base) MCG/ACT AEPB Inhale 1-2 puffs into the lungs every 6 (six) hours as needed. 1 each 1   aspirin  EC 81 MG tablet Take 1 tablet (81 mg total) by mouth daily. 60 tablet 11   ferrous sulfate  325 (65 FE) MG EC tablet TAKE 1 TABLET BY MOUTH EVERY DAY WITH BREAKFAST (Patient not taking: Reported on 11/23/2023) 90 tablet 1   Fluticasone Propionate , Inhal, (FLUTICASONE PROPIONATE  DISKUS) 250 MCG/ACT AEPB TAKE 1 PUFF BY MOUTH EVERY DAY 60 each 1   lenalidomide  (REVLIMID ) 10 MG capsule Take 1 capsule by mouth daily for 21 days, then 7 days off, then repeat. 21 capsule 0   lidocaine -prilocaine  (EMLA ) cream Apply 1 Application topically as needed. Apply to Port-A-Cath at  least 1 hr prior to treatment. 30 g 0   Multiple Vitamins-Minerals (MULTIVITAMIN WITH MINERALS) tablet Take 1 tablet by mouth at bedtime.     nicotine  (NICODERM CQ  - DOSED IN MG/24 HOURS) 14 mg/24hr patch PLACE 1 PATCH (14 MG TOTAL) ONTO THE SKIN DAILY. 28 patch 1   ondansetron  (ZOFRAN ) 4 MG tablet Take 2 tablets (8 mg total) by mouth every 8 (eight) hours as needed for nausea. (Patient taking differently: Take 8 mg by mouth every 8 (eight) hours as needed for nausea. For PRN use) 30 tablet 0   Vitamin D , Ergocalciferol , (DRISDOL ) 1.25 MG (50000 UNIT) CAPS capsule Take 1 capsule (50,000 Units total) by mouth once a week. (Patient not taking: Reported on 11/23/2023) 12 capsule 6   Current Facility-Administered Medications on File Prior to Visit  Medication Dose Route Frequency Provider Last Rate Last Admin   acetaminophen  (TYLENOL ) 325 MG tablet            famotidine  (PEPCID ) 20 MG tablet             PHYSICAL EXAMINATION:  ECOG FS:1 - Symptomatic but completely ambulatory There were no vitals filed for this visit.   Wt Readings from Last 3 Encounters:  03/28/24 155 lb 8 oz (70.5 kg)  03/14/24 156 lb (70.8 kg)  02/29/24 156 lb 8 oz (71 kg)  NAD GENERAL:alert, in no acute distress and comfortable SKIN: no acute rashes, no significant lesions EYES: conjunctiva are pink and non-injected, sclera anicteric LYMPH:  no palpable lymphadenopathy in the cervical or supraclavicular regions LUNGS: clear to auscultation b/l with normal respiratory effort HEART: regular rate & rhythm Extremity: no pedal edema PSYCH: alert & oriented x 3 with fluent speech NEURO: no focal motor/sensory deficits   LABORATORY DATA:  I have reviewed the data as listed     Latest Ref Rng & Units 03/28/2024    8:56 AM 03/14/2024    8:40 AM 02/29/2024    8:42 AM  CBC  WBC 4.0 - 10.5 K/uL 10.6  6.7  12.7   Hemoglobin 13.0 - 17.0 g/dL 87.0  87.4  86.2   Hematocrit 39.0 - 52.0 % 37.4  36.6  40.0   Platelets 150 - 400  K/uL 311  279  318    CBC    Component Value Date/Time   WBC 10.6 (H) 03/28/2024 0856   WBC 10.1 08/23/2022 0835   RBC 4.29 03/28/2024 0856   HGB 12.9 (L) 03/28/2024 0856   HGB 8.3 (L) 12/14/2017 1230   HCT 37.4 (L) 03/28/2024 0856   HCT 23.9 (L) 12/14/2017 1230   PLT 311 03/28/2024 0856   PLT 524 (H) 12/14/2017 1230   MCV 87.2 03/28/2024 0856   MCV 88.4 12/14/2017 1230  MCH 30.1 03/28/2024 0856   MCHC 34.5 03/28/2024 0856   RDW 15.1 03/28/2024 0856   RDW 15.3 (H) 12/14/2017 1230   LYMPHSABS 2.5 03/28/2024 0856   LYMPHSABS 1.4 12/14/2017 1230   MONOABS 0.7 03/28/2024 0856   MONOABS 0.6 12/14/2017 1230   EOSABS 0.2 03/28/2024 0856   EOSABS 0.1 12/14/2017 1230   BASOSABS 0.1 03/28/2024 0856   BASOSABS 0.1 12/14/2017 1230        Latest Ref Rng & Units 03/14/2024    8:40 AM 02/29/2024    8:42 AM 02/15/2024    8:30 AM  CMP  Glucose 70 - 99 mg/dL 86  896  883   BUN 8 - 23 mg/dL 27  22  23    Creatinine 0.61 - 1.24 mg/dL 8.42  8.60  8.46   Sodium 135 - 145 mmol/L 140  142  141   Potassium 3.5 - 5.1 mmol/L 4.1  4.0  4.0   Chloride 98 - 111 mmol/L 107  106  110   CO2 22 - 32 mmol/L 25  27  25    Calcium 8.9 - 10.3 mg/dL 9.2  9.2  8.9   Total Protein 6.5 - 8.1 g/dL 7.0  7.1  7.0   Total Bilirubin 0.0 - 1.2 mg/dL 0.5  0.4  0.6   Alkaline Phos 38 - 126 U/L 76  74  71   AST 15 - 41 U/L 10  10  11    ALT 0 - 44 U/L 9  10  9        03/09/20 ([id number]) Bone Marrow Biopsy at [facility]   07/02/2019 Bone Marrow Biopsy ([id number]) at [facility]    08/07/18 BM Bx:        RADIOGRAPHIC STUDIES: I have personally reviewed the radiological images as listed and agreed with the findings in the report. No results found.  ASSESSMENT & PLAN:  Andres Fernandez is a 68 y.o. male presenting to the clinic for a follow up for multiple myeloma.    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 - Initial BM Bx showed >95% involvement with kappa restricted serum free light chains. 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 MoL Cy-   -08/07/18 BM Bx revealed normocellular bone marrow with trilineage hematopoiesis and 1% plasma cells which indicates the pt is in remission 08/06/18 PET/CT revealed  Expansile lytic lesion involving the left iliac bone, unchanged. Lytic lesion involving the sternum, unchanged. While both are suspicious for myeloma, neither lesion is FDG avid. No hypermetabolic osseous lesions in the visualized axial and appendicular skeleton. Please note that the bilateral lower extremities were not imaged. No suspicious lymphadenopathy. Spleen is normal in size.   -Pt began Ninlaro  3mg  maintenance after pt finished 13 cycles of CyBorD and BM bx showed only 1% plasma cells, then resumed CyBorD and held Ninlaro  in anticipation of his tandem bone marrow transplants beginning in late December 2019 with Dr. Rogue Stallion at [facility]  -11/19/18 High dose chemotherapy with Melphalan 140mg /m2 and autologous stem cell rescue -03/20/19 High dose chemotherapy with Melphalan 200mg /m2 and autologous stem cell rescue. Non-infectious diarrhea, uncomplicated course otherwise. -07/02/2019 PET scan with results revealing 1. No FDG avid bone lesions are identified. 2.  Asymmetric uptake within the right, greater than  left, palatine tonsils with mild fullness of the right tonsillar soft tissue. ENT consult vs attention on follow up suggested. 3.  Innumerable mixed sclerotic and lytic lesions throughout the axial and appendicular skeleton without associated hypermetabolic activity. 4.  Ancillary CT findings as above. -07/02/2019 bone marrow biopsy ([id number])with results showing: Trilineage hematopoieses with  maturation. No plasma cell myeloma identified. Mild anemia and No Rouleaux or circulating plasma cells identified in the peripheral blood.  2) s/p Prophylactic IM nailing for rt femur  -[facility]  -patient previously reported improvement and is currently progressively back to full time work.  3) Renal insufficiency --likely related to multiple myeloma -maintain follow up with nephrology referral to Dr Rayburn [facility] for continued treatment -Pt completed 8 weeks of maverick for hep c with Dr Efrain on 07/08/18.   4) Normocytic anemia: --Iron panel from 10/30/2022 showed iron deficiency  PLAN: -Reviewed labs from today and adequate for treatment. WBC 10.6, Hgb 12.9, Plt 311, Creatinine stable at 1.43. Normal calcium -No prohibitive toxicities identified from treatment. Recommend to continue with Carfilzomib  q 2 weeks and maintenance Revlimid .  -Continue with ASA for thrombotic prophylaxis. -Continue Aredia  every 8 weeks for maintenance. Next dose due today -Obtain knee xray to evaluate knee pain.  -Obtain chest xray to evaluate ongoing productive cough.   FOLLOW UP: -Labs and treatment in 2 weeks -Labs, f/u visit with Dr. Onesimo and treatment in 4 weeks  All of the patient's questions were answered with apparent satisfaction. The patient knows to call the clinic with any problems, questions or concerns.  I have spent a total of 30 minutes minutes of face-to-face and non-face-to-face time, preparing to see the patient, performing a medically appropriate examination, counseling and educating the patient, ordering medications/tests/procedures, documenting clinical information in the electronic health record, and care coordination.   Johnston Police PA-C Dept of Hematology and Oncology [facility] at [facility] Phone: [phone]

## 2024-03-31 ENCOUNTER — Encounter: Payer: Self-pay | Admitting: Hematology

## 2024-04-02 ENCOUNTER — Other Ambulatory Visit: Payer: Self-pay

## 2024-04-04 ENCOUNTER — Encounter: Payer: Self-pay | Admitting: Physician Assistant

## 2024-04-04 DIAGNOSIS — M898X9 Other specified disorders of bone, unspecified site: Secondary | ICD-10-CM

## 2024-04-04 NOTE — Progress Notes (Signed)
 Contacted pt per [facility] to let pt know: that his Xray of his knee showed a lesion on the fibula (Lower leg bone). Dr. Salomon Cree recommended a PET scan.  Pt acknowledged information and verbalized understanding. PET scheduled

## 2024-04-08 ENCOUNTER — Other Ambulatory Visit: Payer: Self-pay

## 2024-04-08 DIAGNOSIS — C9010 Plasma cell leukemia not having achieved remission: Secondary | ICD-10-CM

## 2024-04-08 MED ORDER — LENALIDOMIDE 10 MG PO CAPS: 10.0000 mg | ORAL_CAPSULE | Freq: Every day | ORAL | 0 refills | Status: DC

## 2024-04-11 ENCOUNTER — Inpatient Hospital Stay: Payer: Medicare Other | Attending: Hematology

## 2024-04-11 ENCOUNTER — Inpatient Hospital Stay: Payer: Medicare Other

## 2024-04-11 ENCOUNTER — Encounter (HOSPITAL_COMMUNITY)
Admission: RE | Admit: 2024-04-11 | Discharge: 2024-04-11 | Disposition: A | Discharge status: Home / Self Care | Source: Ambulatory Visit | Attending: Physician Assistant | Admitting: Physician Assistant

## 2024-04-11 VITALS — BP 140/88 | HR 78 | Temp 98.3°F | Resp 16 | Wt 154.5 lb

## 2024-04-11 DIAGNOSIS — C9001 Multiple myeloma in remission: Secondary | ICD-10-CM | POA: Insufficient documentation

## 2024-04-11 DIAGNOSIS — Z95828 Presence of other vascular implants and grafts: Secondary | ICD-10-CM

## 2024-04-11 DIAGNOSIS — Z5111 Encounter for antineoplastic chemotherapy: Secondary | ICD-10-CM | POA: Diagnosis present

## 2024-04-11 DIAGNOSIS — M898X9 Other specified disorders of bone, unspecified site: Secondary | ICD-10-CM | POA: Insufficient documentation

## 2024-04-11 DIAGNOSIS — Z7189 Other specified counseling: Secondary | ICD-10-CM

## 2024-04-11 DIAGNOSIS — C9011 Plasma cell leukemia in remission: Secondary | ICD-10-CM | POA: Insufficient documentation

## 2024-04-11 DIAGNOSIS — M84551A Pathological fracture in neoplastic disease, right femur, initial encounter for fracture: Secondary | ICD-10-CM

## 2024-04-11 DIAGNOSIS — C9010 Plasma cell leukemia not having achieved remission: Secondary | ICD-10-CM

## 2024-04-11 LAB — CMP (CANCER CENTER ONLY)
ALT: 11 U/L (ref 0–44)
AST: 10 U/L — ABNORMAL LOW (ref 15–41)
Albumin: 4.1 g/dL (ref 3.5–5.0)
Alkaline Phosphatase: 70 U/L (ref 38–126)
Anion gap: 8 (ref 5–15)
BUN: 21 mg/dL (ref 8–23)
CO2: 25 mmol/L (ref 22–32)
Calcium: 9 mg/dL (ref 8.9–10.3)
Chloride: 107 mmol/L (ref 98–111)
Creatinine: 1.44 mg/dL — ABNORMAL HIGH (ref 0.61–1.24)
GFR, Estimated: 54 mL/min — ABNORMAL LOW (ref >60)
Glucose, Bld: 94 mg/dL (ref 70–99)
Potassium: 4.1 mmol/L (ref 3.5–5.1)
Sodium: 140 mmol/L (ref 135–145)
Total Bilirubin: 0.4 mg/dL (ref 0.0–1.2)
Total Protein: 7.1 g/dL (ref 6.5–8.1)

## 2024-04-11 LAB — CBC WITH DIFFERENTIAL (CANCER CENTER ONLY)
Abs Immature Granulocytes: 0.07 10*3/uL (ref 0.00–0.07)
Basophils Absolute: 0.1 10*3/uL (ref 0.0–0.1)
Basophils Relative: 1 %
Eosinophils Absolute: 0.4 10*3/uL (ref 0.0–0.5)
Eosinophils Relative: 5 %
HCT: 37.6 % — ABNORMAL LOW (ref 39.0–52.0)
Hemoglobin: 13.1 g/dL (ref 13.0–17.0)
Immature Granulocytes: 1 %
Lymphocytes Relative: 37 %
Lymphs Abs: 2.7 10*3/uL (ref 0.7–4.0)
MCH: 30 pg (ref 26.0–34.0)
MCHC: 34.8 g/dL (ref 30.0–36.0)
MCV: 86 fL (ref 80.0–100.0)
Monocytes Absolute: 1 10*3/uL (ref 0.1–1.0)
Monocytes Relative: 13 %
Neutro Abs: 3.1 10*3/uL (ref 1.7–7.7)
Neutrophils Relative %: 43 %
Platelet Count: 377 10*3/uL (ref 150–400)
RBC: 4.37 MIL/uL (ref 4.22–5.81)
RDW: 14.7 % (ref 11.5–15.5)
WBC Count: 7.3 10*3/uL (ref 4.0–10.5)
nRBC: 0 % (ref 0.0–0.2)

## 2024-04-11 LAB — GLUCOSE, CAPILLARY: Glucose-Capillary: 101 mg/dL — ABNORMAL HIGH (ref 70–99)

## 2024-04-11 MED ORDER — SODIUM CHLORIDE 0.9% FLUSH
10.0000 mL | Freq: Once | INTRAVENOUS | Status: AC
  Administered 2024-04-11: 10 mL

## 2024-04-11 MED ORDER — CARFILZOMIB CHEMO INJECTION 60 MG
56.0000 mg/m2 | Freq: Once | INTRAVENOUS | Status: AC
  Administered 2024-04-11: 100 mg via INTRAVENOUS
  Filled 2024-04-11: qty 30

## 2024-04-11 MED ORDER — HEPARIN SOD (PORK) LOCK FLUSH 100 UNIT/ML IV SOLN
500.0000 [IU] | Freq: Once | INTRAVENOUS | Status: AC | PRN
  Administered 2024-04-11: 500 [IU]

## 2024-04-11 MED ORDER — FLUDEOXYGLUCOSE F - 18 (FDG) INJECTION
7.5200 | Freq: Once | INTRAVENOUS | Status: AC
Start: 2024-04-11 — End: 2024-04-11
  Administered 2024-04-11: 7.52 via INTRAVENOUS

## 2024-04-11 MED ORDER — SODIUM CHLORIDE 0.9% FLUSH
10.0000 mL | INTRAVENOUS | Status: DC | PRN
  Administered 2024-04-11: 10 mL

## 2024-04-11 MED ORDER — FAMOTIDINE IN NACL 20-0.9 MG/50ML-% IV SOLN
20.0000 mg | Freq: Once | INTRAVENOUS | Status: AC
Start: 2024-04-11 — End: 2024-04-11
  Administered 2024-04-11: 20 mg via INTRAVENOUS
  Filled 2024-04-11: qty 50

## 2024-04-11 MED ORDER — DEXAMETHASONE SODIUM PHOSPHATE 10 MG/ML IJ SOLN
6.0000 mg | Freq: Once | INTRAMUSCULAR | Status: AC
  Administered 2024-04-11: 6 mg via INTRAVENOUS
  Filled 2024-04-11: qty 1

## 2024-04-11 MED ORDER — SODIUM CHLORIDE 0.9 % IV SOLN: Freq: Once | INTRAVENOUS | Status: AC

## 2024-04-11 MED ORDER — ACETAMINOPHEN 325 MG PO TABS
650.0000 mg | ORAL_TABLET | Freq: Once | ORAL | Status: AC
Start: 2024-04-11 — End: 2024-04-11
  Administered 2024-04-11: 650 mg via ORAL
  Filled 2024-04-11: qty 2

## 2024-04-11 NOTE — Patient Instructions (Signed)

## 2024-04-14 LAB — KAPPA/LAMBDA LIGHT CHAINS
Kappa free light chain: 18.8 mg/L (ref 3.3–19.4)
Kappa, lambda light chain ratio: 1.06 (ref 0.26–1.65)
Lambda free light chains: 17.7 mg/L (ref 5.7–26.3)

## 2024-04-17 ENCOUNTER — Emergency Department (HOSPITAL_COMMUNITY)

## 2024-04-17 ENCOUNTER — Observation Stay (HOSPITAL_COMMUNITY)
Admission: EM | Admit: 2024-04-17 | Discharge: 2024-04-18 | Disposition: A | Discharge status: Home / Self Care | Attending: Family Medicine | Admitting: Family Medicine

## 2024-04-17 ENCOUNTER — Encounter (HOSPITAL_COMMUNITY): Payer: Self-pay

## 2024-04-17 ENCOUNTER — Telehealth: Payer: Self-pay | Admitting: Internal Medicine

## 2024-04-17 ENCOUNTER — Other Ambulatory Visit: Payer: Self-pay

## 2024-04-17 DIAGNOSIS — Z859 Personal history of malignant neoplasm, unspecified: Secondary | ICD-10-CM | POA: Insufficient documentation

## 2024-04-17 DIAGNOSIS — F17210 Nicotine dependence, cigarettes, uncomplicated: Secondary | ICD-10-CM | POA: Diagnosis not present

## 2024-04-17 DIAGNOSIS — R0602 Shortness of breath: Secondary | ICD-10-CM | POA: Diagnosis present

## 2024-04-17 DIAGNOSIS — I129 Hypertensive chronic kidney disease with stage 1 through stage 4 chronic kidney disease, or unspecified chronic kidney disease: Secondary | ICD-10-CM | POA: Diagnosis not present

## 2024-04-17 DIAGNOSIS — C9010 Plasma cell leukemia not having achieved remission: Secondary | ICD-10-CM | POA: Diagnosis present

## 2024-04-17 DIAGNOSIS — Z7901 Long term (current) use of anticoagulants: Secondary | ICD-10-CM | POA: Insufficient documentation

## 2024-04-17 DIAGNOSIS — N179 Acute kidney failure, unspecified: Principal | ICD-10-CM | POA: Diagnosis present

## 2024-04-17 DIAGNOSIS — N1831 Chronic kidney disease, stage 3a: Secondary | ICD-10-CM | POA: Insufficient documentation

## 2024-04-17 DIAGNOSIS — R0682 Tachypnea, not elsewhere classified: Secondary | ICD-10-CM

## 2024-04-17 DIAGNOSIS — J069 Acute upper respiratory infection, unspecified: Secondary | ICD-10-CM | POA: Insufficient documentation

## 2024-04-17 DIAGNOSIS — Z79899 Other long term (current) drug therapy: Secondary | ICD-10-CM | POA: Diagnosis not present

## 2024-04-17 DIAGNOSIS — Z7982 Long term (current) use of aspirin: Secondary | ICD-10-CM | POA: Insufficient documentation

## 2024-04-17 DIAGNOSIS — R0609 Other forms of dyspnea: Secondary | ICD-10-CM | POA: Diagnosis not present

## 2024-04-17 DIAGNOSIS — J111 Influenza due to unidentified influenza virus with other respiratory manifestations: Secondary | ICD-10-CM | POA: Diagnosis not present

## 2024-04-17 DIAGNOSIS — C3431 Malignant neoplasm of lower lobe, right bronchus or lung: Secondary | ICD-10-CM | POA: Insufficient documentation

## 2024-04-17 DIAGNOSIS — R918 Other nonspecific abnormal finding of lung field: Secondary | ICD-10-CM | POA: Diagnosis not present

## 2024-04-17 HISTORY — DX: Malignant (primary) neoplasm, unspecified: C80.1

## 2024-04-17 LAB — COMPREHENSIVE METABOLIC PANEL WITH GFR
ALT: 17 U/L (ref 0–44)
AST: 24 U/L (ref 15–41)
Albumin: 3.5 g/dL (ref 3.5–5.0)
Alkaline Phosphatase: 49 U/L (ref 38–126)
Anion gap: 12 (ref 5–15)
BUN: 33 mg/dL — ABNORMAL HIGH (ref 8–23)
CO2: 21 mmol/L — ABNORMAL LOW (ref 22–32)
Calcium: 8.2 mg/dL — ABNORMAL LOW (ref 8.9–10.3)
Chloride: 105 mmol/L (ref 98–111)
Creatinine, Ser: 1.99 mg/dL — ABNORMAL HIGH (ref 0.61–1.24)
GFR, Estimated: 36 mL/min — ABNORMAL LOW (ref >60)
Glucose, Bld: 94 mg/dL (ref 70–99)
Potassium: 4.4 mmol/L (ref 3.5–5.1)
Sodium: 138 mmol/L (ref 135–145)
Total Bilirubin: 0.6 mg/dL (ref 0.0–1.2)
Total Protein: 6.9 g/dL (ref 6.5–8.1)

## 2024-04-17 LAB — CBC WITH DIFFERENTIAL/PLATELET
Abs Immature Granulocytes: 0.02 10*3/uL (ref 0.00–0.07)
Basophils Absolute: 0 10*3/uL (ref 0.0–0.1)
Basophils Relative: 1 %
Eosinophils Absolute: 0 10*3/uL (ref 0.0–0.5)
Eosinophils Relative: 0 %
HCT: 41.8 % (ref 39.0–52.0)
Hemoglobin: 13.6 g/dL (ref 13.0–17.0)
Immature Granulocytes: 1 %
Lymphocytes Relative: 33 %
Lymphs Abs: 1.4 10*3/uL (ref 0.7–4.0)
MCH: 29.3 pg (ref 26.0–34.0)
MCHC: 32.5 g/dL (ref 30.0–36.0)
MCV: 90.1 fL (ref 80.0–100.0)
Monocytes Absolute: 0.9 10*3/uL (ref 0.1–1.0)
Monocytes Relative: 21 %
Neutro Abs: 1.9 10*3/uL (ref 1.7–7.7)
Neutrophils Relative %: 44 %
Platelets: 136 10*3/uL — ABNORMAL LOW (ref 150–400)
RBC: 4.64 MIL/uL (ref 4.22–5.81)
RDW: 15.1 % (ref 11.5–15.5)
WBC: 4.1 10*3/uL (ref 4.0–10.5)
nRBC: 0 % (ref 0.0–0.2)

## 2024-04-17 LAB — TROPONIN I (HIGH SENSITIVITY)
Troponin I (High Sensitivity): 13 ng/L (ref <18)
Troponin I (High Sensitivity): 14 ng/L (ref <18)

## 2024-04-17 LAB — BRAIN NATRIURETIC PEPTIDE: B Natriuretic Peptide: 25.7 pg/mL (ref 0.0–100.0)

## 2024-04-17 MED ORDER — OSELTAMIVIR PHOSPHATE 30 MG PO CAPS: 30.0000 mg | ORAL_CAPSULE | Freq: Two times a day (BID) | ORAL | Status: DC

## 2024-04-17 MED ORDER — SODIUM CHLORIDE (PF) 0.9 % IJ SOLN
INTRAMUSCULAR | Status: AC
  Filled 2024-04-17: qty 50

## 2024-04-17 MED ORDER — LENALIDOMIDE 10 MG PO CAPS: 10.0000 mg | ORAL_CAPSULE | Freq: Every day | ORAL | Status: DC

## 2024-04-17 MED ORDER — IOHEXOL 350 MG/ML SOLN
75.0000 mL | Freq: Once | INTRAVENOUS | Status: AC | PRN
  Administered 2024-04-17: 70 mL via INTRAVENOUS

## 2024-04-17 MED ORDER — ENOXAPARIN SODIUM 40 MG/0.4ML IJ SOSY
40.0000 mg | PREFILLED_SYRINGE | INTRAMUSCULAR | Status: DC
  Administered 2024-04-17: 40 mg via SUBCUTANEOUS
  Filled 2024-04-17: qty 0.4

## 2024-04-17 MED ORDER — ONDANSETRON HCL 4 MG PO TABS: 4.0000 mg | ORAL_TABLET | Freq: Four times a day (QID) | ORAL | Status: DC | PRN

## 2024-04-17 MED ORDER — OSELTAMIVIR PHOSPHATE 30 MG PO CAPS
30.0000 mg | ORAL_CAPSULE | Freq: Two times a day (BID) | ORAL | Status: DC
Start: 2024-04-18 — End: 2024-04-18
  Administered 2024-04-18: 30 mg via ORAL
  Filled 2024-04-17 (×2): qty 1

## 2024-04-17 MED ORDER — OSELTAMIVIR PHOSPHATE 75 MG PO CAPS
75.0000 mg | ORAL_CAPSULE | Freq: Once | ORAL | Status: AC
  Administered 2024-04-17: 75 mg via ORAL
  Filled 2024-04-17: qty 1

## 2024-04-17 MED ORDER — LACTATED RINGERS IV BOLUS
1000.0000 mL | Freq: Once | INTRAVENOUS | Status: AC
  Administered 2024-04-17: 1000 mL via INTRAVENOUS

## 2024-04-17 MED ORDER — ACETAMINOPHEN 325 MG PO TABS: 650.0000 mg | ORAL_TABLET | Freq: Four times a day (QID) | ORAL | Status: DC | PRN

## 2024-04-17 MED ORDER — SODIUM CHLORIDE 0.9 % IV SOLN: INTRAVENOUS | Status: DC

## 2024-04-17 MED ORDER — ACETAMINOPHEN 650 MG RE SUPP: 650.0000 mg | Freq: Four times a day (QID) | RECTAL | Status: DC | PRN

## 2024-04-17 MED ORDER — ONDANSETRON HCL 4 MG/2ML IJ SOLN: 4.0000 mg | Freq: Four times a day (QID) | INTRAMUSCULAR | Status: DC | PRN

## 2024-04-17 NOTE — Telephone Encounter (Signed)
 Patient admitted with acute kidney injury and dehydration to the hospitalist service at [facility].  Patient is known to have plasma cell leukemia and sees Dr. Salomon Cree.  During this evaluation was found to have satellite spiculated nodule 2.1 cm with hypermetabolic area.  Also noted to have COPD.  This potential hilar lymphadenopathy.  There is both on the CT and the PET scan..  The nodules in the right upper lobe   Hospitalist called me because it was an inpatient Melodee Spruce long service but is requesting outpatient set up  Plan - Get him to see Dr. Byrum or Dygali or Assaker ASAP   CT Angio Chest PE W and/or Wo Contrast Result Date: 04/17/2024 CLINICAL DATA:  Pulmonary embolism (PE) suspected, high prob History of multiple myeloma. EXAM: CT ANGIOGRAPHY CHEST WITH CONTRAST TECHNIQUE: Multidetector CT imaging of the chest was performed using the standard protocol during bolus administration of intravenous contrast. Multiplanar CT image reconstructions and MIPs were obtained to evaluate the vascular anatomy. RADIATION DOSE REDUCTION: This exam was performed according to the departmental dose-optimization program which includes automated exposure control, adjustment of the mA and/or kV according to patient size and/or use of iterative reconstruction technique. CONTRAST:  70mL OMNIPAQUE  IOHEXOL  350 MG/ML SOLN COMPARISON:  PET-CT 04/11/2024.  Chest CTA 08/23/2022. FINDINGS: Cardiovascular: The pulmonary arteries are well opacified with contrast to the level of the segmental branches. There is no evidence of acute pulmonary embolism. Mild aortic atherosclerosis without evidence of acute systemic arterial abnormality. The heart size is normal. There is no pericardial effusion. Mediastinum/Nodes: There are no enlarged mediastinal, hilar or axillary lymph nodes.Mildly prominent mediastinal and hilar lymph nodes appear similar to previous CT. The thyroid gland, trachea and esophagus demonstrate no significant findings.  Lungs/Pleura: No pleural effusion or pneumothorax. Severe centrilobular and paraseptal emphysema. Spiculated right lower lobe mass measuring 3.1 x 2.9 cm on image 111/12 has enlarged from the previous CT and was hypermetabolic on PET-CT. There is a satellite spiculated nodule measuring 2.1 x 1.8 cm on image 103/12. Possible additional other new smaller pulmonary nodules in the right lower lobe (image 75/12), the right upper lobe (image 81/12) and the left lower lobe (image 120/12). No confluent airspace disease. Upper abdomen: No significant findings are seen in the visualized upper abdomen. Musculoskeletal/Chest wall: There is no chest wall mass or suspicious osseous finding. Review of the MIP images confirms the above findings. IMPRESSION: 1. No evidence of acute pulmonary embolism or other acute vascular findings in the chest. 2. 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 See forthcoming PET-CT report. 3. Aortic Atherosclerosis (ICD10-I70.0) and Emphysema (ICD10-J43.9). Electronically Signed   By: Elmon Hagedorn M.D.   On: 04/17/2024 14:09       has a past medical history of BPH (benign prostatic hyperplasia), Cancer (HCC), Hepatitis C (11/27/2017), Hypertension, Plasma cell leukemia (HCC) (11/23/2017), and Tobacco abuse.   reports that he quit smoking about 6 years ago. His smoking use included cigarettes. He has never used smokeless tobacco.  Past Surgical History:  Procedure Laterality Date   APPENDECTOMY     FEMUR IM NAIL Right 11/20/2017   Procedure: RIGHT FEMORAL INTRAMEDULLARY (IM) NAIL;  Surgeon: Janeth Medicus, MD;  Location: [facility] OR;  Service: Orthopedics;  Laterality: Right;   IR IMAGING GUIDED PORT INSERTION  08/19/2021    No Known Allergies  Immunization History  Administered Date(s) Administered   DTaP /  IPV 09/17/2019,  12/17/2019, 03/31/2020   Dtap, Unspecified 07/20/1958, 04/27/1959, 02/12/1961, 07/21/1963   Fluad Quad(high Dose 65+) 09/11/2022   HIB (PRP-OMP) 09/17/2019, 12/17/2019, 03/31/2020   Hepatitis B, ADULT 09/17/2019, 12/17/2019   Hepatitis B, PED/ADOLESCENT 09/15/2020   Influenza, High Dose Seasonal PF 08/20/2023   Influenza,inj,Quad PF,6+ Mos 09/25/2018, 08/27/2019, 09/03/2020, 10/14/2021   Influenza-Unspecified 09/25/2018, 08/27/2019, 09/03/2020   MMR 03/21/2021   Moderna Covid-19 Vaccine  Bivalent Booster 63yrs & up 10/04/2021   Moderna Sars-Covid-2 Vaccination 03/17/2020, 04/14/2020, 10/15/2020, 03/29/2021   PNEUMOCOCCAL CONJUGATE-20 06/18/2023   Pfizer(Comirnaty)Fall Seasonal Vaccine 12 years and older 09/07/2022, 08/20/2023   Pneumococcal Conjugate-13 09/17/2019, 12/17/2019, 03/31/2020   Pneumococcal Polysaccharide-23 09/15/2020   Polio, Unspecified 07/20/1958, 04/27/1959, 02/13/1964   Respiratory Syncytial Virus Vaccine,Recomb Aduvanted(Arexvy) 10/14/2022   Smallpox 07/21/1963    Family History  Problem Relation Age of Onset   COPD Mother    Liver cancer Mother     No current facility-administered medications for this visit. No current outpatient medications on file.  Facility-Administered Medications Ordered in Other Visits:    0.9 %  sodium chloride  infusion, , Intravenous, Continuous, Nettey, Mozell Arias, MD   acetaminophen  (TYLENOL ) 325 MG tablet, , , ,    acetaminophen  (TYLENOL ) tablet 650 mg, 650 mg, Oral, Q6H PRN **OR** acetaminophen  (TYLENOL ) suppository 650 mg, 650 mg, Rectal, Q6H PRN, Verlyn Goad, MD   enoxaparin  (LOVENOX ) injection 40 mg, 40 mg, Subcutaneous, Q24H, Nettey, Mozell Arias, MD   famotidine  (PEPCID ) 20 MG tablet, , , ,    lenalidomide  (REVLIMID ) capsule 10 mg, 10 mg, Oral, Daily, Nettey, Mozell Arias, MD   ondansetron  (ZOFRAN ) tablet 4 mg, 4 mg, Oral, Q6H PRN **OR** ondansetron  (ZOFRAN ) injection 4 mg, 4 mg, Intravenous, Q6H PRN, Verlyn Goad, MD   [START ON  04/18/2024] oseltamivir  (TAMIFLU ) capsule 30 mg, 30 mg, Oral, BID, Nettey, Mozell Arias, MD   oseltamivir  (TAMIFLU ) capsule 75 mg, 75 mg, Oral, Once, Verlyn Goad, MD

## 2024-04-17 NOTE — Plan of Care (Signed)

## 2024-04-17 NOTE — ED Triage Notes (Signed)
 Pt to er, pt states that he has multiple myeloma and is in remission, states that he is here for some sob, states that two weeks ago he was seen and they found he had the flu and pneumonia.  States that he was given and abd and was feeling  better, but now is feeling poorly again

## 2024-04-17 NOTE — Progress Notes (Signed)
 Prior-To-Admission Oral Chemotherapy for Treatment of Oncologic Disease   Order noted from Dr. Duard Getting to continue prior-to-admission oral chemotherapy regimen of lenalidomide .  Procedure Per Pharmacy & Therapeutics Committee Policy: Orders for continuation of home oral chemotherapy for treatment of an oncologic disease will be held unless approved by an oncologist during current admission.    For patients receiving oncology care at [facility], inpatient pharmacist contacts patient's oncologist during regular office hours to review. If earlier review is medically necessary, attending physician consults [facility] on-call oncologist  Oral chemotherapy continuation order is on hold pending oncologist review, [facility] oncologist Dr Salomon Cree will be notified by inpatient pharmacy during office hours    Kendall Pauls PharmD, BCPS [facility] main pharmacy [phone] 04/17/2024 5:27 PM

## 2024-04-17 NOTE — ED Notes (Signed)
 ED TO INPATIENT HANDOFF REPORT  Name/Age/Gender Andres Fernandez 68 y.o. male  Code Status Code Status History     Date Active Date Inactive Code Status Order ID Comments User Context   08/07/2020 1514 08/09/2020 1548 Full Code [id number]  Oral Billings, MD ED   11/23/2017 1932 12/06/2017 1834 Full Code [id number]  Maylene Spear, MD Inpatient   11/17/2017 0821 11/22/2017 1810 Full Code [id number]  Lovell Rubenstein, NP ED       Home/SNF/Other Home  Chief Complaint AKI (acute kidney injury) (HCC) [N17.9]  Level of Care/Admitting Diagnosis ED Disposition     ED Disposition  Admit   Condition  --   Comment  Hospital Area: [facility] [[id number]]  Level of Care: Med-Surg [16]  May place patient in observation at [facility] or [facility] if equivalent level of care is available:: No  Covid Evaluation: Asymptomatic - no recent exposure (last 10 days) testing not required  Diagnosis: AKI (acute kidney injury) [facility]) [[id number]]  Admitting Physician: Verlyn Goad [id number]  Attending Physician: Verlyn Goad [phone]          Medical History Past Medical History:  Diagnosis Date   BPH (benign prostatic hyperplasia)    Cancer (HCC)    Hepatitis C 11/27/2017   Hypertension    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0 - 10.5 K/uL   RBC 4.64 4.22 - 5.81  MIL/uL   Hemoglobin 13.6 13.0 - 17.0 g/dL   HCT 40.3 47.4 - 25.9 %   MCV 90.1 80.0 - 100.0 fL   MCH 29.3 26.0 - 34.0 pg   MCHC 32.5 30.0 - 36.0 g/dL   RDW 56.3 87.5 - 64.3 %   Platelets 136 (L) 150 - 400 K/uL   nRBC 0.0 0.0 - 0.2 %   Neutrophils Relative % 44 %   Neutro Abs 1.9 1.7 - 7.7 K/uL   Lymphocytes Relative 33 %   Lymphs Abs 1.4 0.7 - 4.0 K/uL   Monocytes Relative 21 %   Monocytes Absolute 0.9 0.1 - 1.0 K/uL   Eosinophils Relative 0 %   Eosinophils Absolute 0.0 0.0 - 0.5 K/uL   Basophils Relative 1 %   Basophils Absolute 0.0 0.0 - 0.1 K/uL   Immature Granulocytes 1 %   Abs Immature Granulocytes 0.02 0.00 - 0.07 K/uL    Comment: Performed at [facility], [address] [place], [place] [address]  Comprehensive metabolic panel     Status: Abnormal   Collection Time: 04/17/24 12:18 PM  Result Value Ref Range   Sodium 138 135 - 145 mmol/L   Potassium 4.4 3.5 - 5.1 mmol/L   Chloride 105 98 - 111 mmol/L   CO2 21 (L) 22 - 32 mmol/L   Glucose,  Bld 94 70 - 99 mg/dL    Comment: Glucose reference range applies only to samples taken after fasting for at least 8 hours.   BUN 33 (H) 8 - 23 mg/dL   Creatinine, Ser 1.61 (H) 0.61 - 1.24 mg/dL   Calcium 8.2 (L) 8.9 - 10.3 mg/dL   Total Protein 6.9 6.5 - 8.1 g/dL   Albumin 3.5 3.5 - 5.0 g/dL   AST 24 15 - 41 U/L   ALT 17 0 - 44 U/L   Alkaline Phosphatase 49 38 - 126 U/L   Total Bilirubin 0.6 0.0 - 1.2 mg/dL   GFR, Estimated 36 (L) >60 mL/min    Comment: (NOTE) Calculated using the CKD-EPI Creatinine Equation (2021)    Anion gap 12 5 - 15    Comment: Performed at [facility], [address], [place], [place] [address]  Brain natriuretic peptide     Status: None   Collection Time: 04/17/24 12:18 PM  Result Value Ref Range   B Natriuretic Peptide 25.7 0.0 - 100.0 pg/mL    Comment: Performed at [facility], [address] [place], [place] [address]  Troponin I (High  Sensitivity)     Status: None   Collection Time: 04/17/24 12:18 PM  Result Value Ref Range   Troponin I (High Sensitivity) 13 <18 ng/L    Comment: (NOTE) Elevated high sensitivity troponin I (hsTnI) values and significant  changes across serial measurements may suggest ACS but many other  chronic and acute conditions are known to elevate hsTnI results.  Refer to the "Links" section for chest pain algorithms and additional  guidance. Performed at [facility], [address], [place], [place] [address]   Troponin I (High Sensitivity)     Status: None   Collection Time: 04/17/24  2:40 PM  Result Value Ref Range   Troponin I (High Sensitivity) 14 <18 ng/L    Comment: (NOTE) Elevated high sensitivity troponin I (hsTnI) values and significant  changes across serial measurements may suggest ACS but many other  chronic and acute conditions are known to elevate hsTnI results.  Refer to the "Links" section for chest pain algorithms and additional  guidance. Performed at [facility], [address] [place], [place] [address]    *Note: Due to a large number of results and/or encounters for the requested time period, some results have not been displayed. A complete set of results can be found in Results Review.   CT Angio Chest PE W and/or Wo Contrast Result Date: 04/17/2024 CLINICAL DATA:  Pulmonary embolism (PE) suspected, high prob History of multiple myeloma. EXAM: CT ANGIOGRAPHY CHEST WITH CONTRAST TECHNIQUE: Multidetector CT imaging of the chest was performed using the standard protocol during bolus administration of intravenous contrast. Multiplanar CT image reconstructions and MIPs were obtained to evaluate the vascular anatomy. RADIATION DOSE REDUCTION: This exam was performed according to the departmental dose-optimization program which includes automated exposure control, adjustment of the mA and/or kV according to patient size and/or use of  iterative reconstruction technique. CONTRAST:  70mL OMNIPAQUE  IOHEXOL  350 MG/ML SOLN COMPARISON:  PET-CT 04/11/2024.  Chest CTA 08/23/2022. FINDINGS: Cardiovascular: The pulmonary arteries are well opacified with contrast to the level of the segmental branches. There is no evidence of acute pulmonary embolism. Mild aortic atherosclerosis without evidence of acute systemic arterial abnormality. The heart size is normal. There is no pericardial effusion. Mediastinum/Nodes: There are no enlarged mediastinal, hilar or axillary lymph nodes.Mildly prominent mediastinal  and hilar lymph nodes appear similar to previous CT. The thyroid gland, trachea and esophagus demonstrate no significant findings. Lungs/Pleura: No pleural effusion or pneumothorax. Severe centrilobular and paraseptal emphysema. Spiculated right lower lobe mass measuring 3.1 x 2.9 cm on image 111/12 has enlarged from the previous CT and was hypermetabolic on PET-CT. There is a satellite spiculated nodule measuring 2.1 x 1.8 cm on image 103/12. Possible additional other new smaller pulmonary nodules in the right lower lobe (image 75/12), the right upper lobe (image 81/12) and the left lower lobe (image 120/12). No confluent airspace disease. Upper abdomen: No significant findings are seen in the visualized upper abdomen. Musculoskeletal/Chest wall: There is no chest wall mass or suspicious osseous finding. Review of the MIP images confirms the above findings. IMPRESSION: 1. No evidence of acute pulmonary embolism or other acute vascular findings in the chest. 2. 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 See forthcoming PET-CT report. 3. Aortic Atherosclerosis (ICD10-I70.0) and Emphysema (ICD10-J43.9). Electronically Signed   By: Elmon Hagedorn M.D.   On: 04/17/2024 14:09    Pending Labs Unresulted  Labs (From admission, onward)    None       Vitals/Pain Today's Vitals   04/17/24 1400 04/17/24 1415 04/17/24 1500 04/17/24 1600  BP:  130/86 (!) 132/90 (!) 147/84  Pulse: 74 90 77 79  Resp: (!) 36 (!) 33 (!) 28 (!) 29  Temp:      TempSrc:      SpO2: 100% 99% 91% 96%  Weight:      Height:      PainSc:        Isolation Precautions Droplet precaution  Medications Medications  iohexol  (OMNIPAQUE ) 350 MG/ML injection 75 mL (70 mLs Intravenous Contrast Given 04/17/24 1337)  lactated ringers  bolus 1,000 mL (0 mLs Intravenous Stopped 04/17/24 1602)    Mobility walks

## 2024-04-17 NOTE — ED Provider Notes (Signed)
 [place] EMERGENCY DEPARTMENT AT [facility] Provider Note  CSN: [id number] Arrival date & time: 04/17/24 1128  Chief Complaint(s) Shortness of Breath  HPI Andres Fernandez is a 68 y.o. male with a history of multiple myeloma on maintenance therapy who is here today for shortness of breath.  Patient says that he was seen 2 weeks ago, diagnosed with pneumonia, given antibiotics and improved.  Says that he began to feel worse yesterday, went to an urgent care where he was diagnosed with the flu, and potentially pneumonia.  Sent to the ED for further evaluation.   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   Malnutrition of moderate degree 12/02/2017   AKI (acute kidney injury) (HCC) 11/27/2017   SIRS (systemic inflammatory response syndrome) (HCC) 11/27/2017   Anemia 11/27/2017   Thrombocytopenia (HCC) 11/27/2017   Chronic hepatitis C without hepatic coma (HCC) 11/27/2017   Cancer associated pain    Tumor lysis syndrome    Encounter for antineoplastic chemotherapy    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 Multiple myeloma vs other bone marrow malignancy 11/17/2017   Hypercalcemia 11/17/2017   BPH (benign prostatic hyperplasia) 11/17/2017   Tobacco abuse 11/17/2017   Elevated  blood-pressure reading without diagnosis of hypertension 11/17/2017   Lytic bone lesions on xray 11/17/2017   Home Medication(s) Prior to Admission medications   Medication Sig Start Date End Date Taking? Authorizing Provider  acyclovir  (ZOVIRAX ) 400 MG tablet Take 1 tablet (400 mg total) by mouth daily. 08/03/23   [name], MD  Albuterol  Sulfate (PROAIR  RESPICLICK) 108 (90 Base) MCG/ACT AEPB Inhale 1-2 puffs into the lungs every 6 (six) hours as needed. 09/28/23   [name], MD  aspirin  EC 81 MG tablet Take 1 tablet (81 mg total) by mouth daily. 11/28/19   [name], MD  ferrous sulfate  325 (65 FE) MG EC tablet TAKE 1 TABLET BY MOUTH EVERY DAY WITH BREAKFAST Patient not taking: Reported on 11/23/2023 02/20/23   Thayil, Irene T, PA-C  Fluticasone Propionate , Inhal, (FLUTICASONE PROPIONATE  DISKUS) 250 MCG/ACT AEPB TAKE 1 PUFF BY MOUTH EVERY DAY 09/28/23   [name], MD  lenalidomide  (REVLIMID ) 10 MG capsule Take 1 capsule (10 mg total) by mouth daily. Take 1 capsule (10 mg total) by mouth daily for 21 days. Take 7 days off. Repeat. 04/08/24   [name], MD  lidocaine -prilocaine  (EMLA ) cream Apply 1 Application topically as needed. Apply to Port-A-Cath at least 1 hr prior to treatment. 01/18/24   Kale, Gautam Kishore, MD  Multiple Vitamins-Minerals (MULTIVITAMIN WITH MINERALS) tablet Take 1 tablet by mouth at bedtime.    [provider]  nicotine  (NICODERM CQ  - DOSED IN MG/24 HOURS) 14 mg/24hr patch PLACE 1 PATCH (14 MG TOTAL) ONTO THE  SKIN DAILY. 09/28/23   [name], MD  ondansetron  (ZOFRAN ) 4 MG tablet Take 2 tablets (8 mg total) by mouth every 8 (eight) hours as needed for nausea. Patient taking differently: Take 8 mg by mouth every 8 (eight) hours as needed for nausea. For PRN use 08/05/21   Kale, Gautam Kishore, MD  Vitamin D , Ergocalciferol , (DRISDOL ) 1.25 MG (50000 UNIT) CAPS capsule Take 1 capsule (50,000 Units total) by  mouth once a week. Patient not taking: Reported on 11/23/2023 09/28/23   [name], MD                                                                                                                                    Past Surgical History Past Surgical History:  Procedure Laterality Date   APPENDECTOMY     FEMUR IM NAIL Right 11/20/2017   Procedure: RIGHT FEMORAL INTRAMEDULLARY (IM) NAIL;  Surgeon: Janeth Medicus, MD;  Location: [facility] OR;  Service: Orthopedics;  Laterality: Right;   IR IMAGING GUIDED PORT INSERTION  08/19/2021   Family History Family History  Problem Relation Age of Onset   COPD Mother    Liver cancer Mother     Social History Social History   Tobacco Use   Smoking status: Every Day    Current packs/day: 0.00    Types: Cigarettes    Last attempt to quit: 11/09/2017    Years since quitting: 6.4   Smokeless tobacco: Never   Tobacco comments:    1 cigarette/day as of 09/25/18  Vaping Use   Vaping status: Never Used  Substance Use Topics   Alcohol use: Not Currently   Drug use: No   Allergies Patient has no known allergies.  Review of Systems Review of Systems  Physical Exam Vital Signs  I have reviewed the triage vital signs BP 130/86   Pulse 93   Temp 99.2 F (37.3 C) (Oral)   Resp (!) 28   Ht 5\' 8"  (1.727 m)   Wt 65.8 kg   SpO2 96%   BMI 22.05 kg/m   Physical Exam Vitals reviewed.  Constitutional:      Appearance: He is not toxic-appearing.  Pulmonary:     Effort: Pulmonary effort is normal.     Breath sounds: No decreased breath sounds, wheezing, rhonchi or rales.  Chest:     Chest wall: No mass or tenderness.  Abdominal:     Palpations: Abdomen is soft.  Neurological:     General: No focal deficit present.     Mental Status: He is alert.     ED Results and Treatments Labs (all labs ordered are listed, but only abnormal results are displayed) Labs Reviewed  CBC WITH DIFFERENTIAL/PLATELET - Abnormal; Notable  for the following components:      Result Value   Platelets 136 (*)    All other components within normal limits  COMPREHENSIVE METABOLIC PANEL WITH GFR -  Abnormal; Notable for the following components:   CO2 21 (*)    BUN 33 (*)    Creatinine, Ser 1.99 (*)    Calcium 8.2 (*)    GFR, Estimated 36 (*)    All other components within normal limits  BRAIN NATRIURETIC PEPTIDE  TROPONIN I (HIGH SENSITIVITY)  TROPONIN I (HIGH SENSITIVITY)                                                                                                                          Radiology CT Angio Chest PE W and/or Wo Contrast Result Date: 04/17/2024 CLINICAL DATA:  Pulmonary embolism (PE) suspected, high prob History of multiple myeloma. EXAM: CT ANGIOGRAPHY CHEST WITH CONTRAST TECHNIQUE: Multidetector CT imaging of the chest was performed using the standard protocol during bolus administration of intravenous contrast. Multiplanar CT image reconstructions and MIPs were obtained to evaluate the vascular anatomy. RADIATION DOSE REDUCTION: This exam was performed according to the departmental dose-optimization program which includes automated exposure control, adjustment of the mA and/or kV according to patient size and/or use of iterative reconstruction technique. CONTRAST:  70mL OMNIPAQUE  IOHEXOL  350 MG/ML SOLN COMPARISON:  PET-CT 04/11/2024.  Chest CTA 08/23/2022. FINDINGS: Cardiovascular: The pulmonary arteries are well opacified with contrast to the level of the segmental branches. There is no evidence of acute pulmonary embolism. Mild aortic atherosclerosis without evidence of acute systemic arterial abnormality. The heart size is normal. There is no pericardial effusion. Mediastinum/Nodes: There are no enlarged mediastinal, hilar or axillary lymph nodes.Mildly prominent mediastinal and hilar lymph nodes appear similar to previous CT. The thyroid gland, trachea and esophagus demonstrate no significant findings.  Lungs/Pleura: No pleural effusion or pneumothorax. Severe centrilobular and paraseptal emphysema. Spiculated right lower lobe mass measuring 3.1 x 2.9 cm on image 111/12 has enlarged from the previous CT and was hypermetabolic on PET-CT. There is a satellite spiculated nodule measuring 2.1 x 1.8 cm on image 103/12. Possible additional other new smaller pulmonary nodules in the right lower lobe (image 75/12), the right upper lobe (image 81/12) and the left lower lobe (image 120/12). No confluent airspace disease. Upper abdomen: No significant findings are seen in the visualized upper abdomen. Musculoskeletal/Chest wall: There is no chest wall mass or suspicious osseous finding. Review of the MIP images confirms the above findings. IMPRESSION: 1. No evidence of acute pulmonary embolism or other acute vascular findings in the chest. 2. 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 See forthcoming PET-CT report. 3. Aortic Atherosclerosis (ICD10-I70.0) and Emphysema (ICD10-J43.9). Electronically Signed   By: Elmon Hagedorn M.D.   On: 04/17/2024 14:09    Pertinent labs & imaging results that were available during my care of the patient were reviewed by me and considered in my medical decision making (see MDM for details).  Medications Ordered in ED Medications  iohexol  (OMNIPAQUE ) 350 MG/ML injection 75 mL (70 mLs  Intravenous Contrast Given 04/17/24 1337)  lactated ringers  bolus 1,000 mL (1,000 mLs Intravenous New Bag/Given 04/17/24 1357)                                                                                                                                     Procedures Procedures  (including critical care time)  Medical Decision Making / ED Course   This patient presents to the ED for concern of shortness of breath, this involves an extensive  number of treatment options, and is a complaint that carries with it a high risk of complications and morbidity.  The differential diagnosis includes influenza, pneumonia, mass.  MDM: On exam, patient overall looks well.  He is afebrile.  Is tachypneic, mid 90s O2 sat.  With his cancer history, possible known infiltrate, will obtain CT imaging of the patient's chest.  Reassessment 2:15 PM-patient's labs reviewed, does have an acute kidney injury.  And providing IV fluids to the patient.  Patient continues to be mildly tachypneic.  His CTA is negative for PE, however unfortunately shows spiculated region that is likely malignancy.  Patient had a PET scan done 6 days ago, currently in the process of being read.  With the patient's multiple medical comorbidities, immunocompromise status, acute kidney injury, believe he benefit from hospitalization for management of symptoms.   Additional history obtained: -Additional history obtained from  -External records from outside source obtained and reviewed including: Chart review including previous notes, labs, imaging, consultation notes   Lab Tests: -I ordered, reviewed, and interpreted labs.   The pertinent results include:   Labs Reviewed  CBC WITH DIFFERENTIAL/PLATELET - Abnormal; Notable for the following components:      Result Value   Platelets 136 (*)    All other components within normal limits  COMPREHENSIVE METABOLIC PANEL WITH GFR - Abnormal; Notable for the following components:   CO2 21 (*)    BUN 33 (*)    Creatinine, Ser 1.99 (*)    Calcium 8.2 (*)    GFR, Estimated 36 (*)    All other components within normal limits  BRAIN NATRIURETIC PEPTIDE  TROPONIN I (HIGH SENSITIVITY)  TROPONIN I (HIGH SENSITIVITY)      EKG my independent review of the patient's EKG shows no ST segment depressions or elevations, no T wave inversions, no evidence of acute ischemia.  EKG Interpretation Date/Time:    Ventricular Rate:    PR  Interval:    QRS Duration:    QT Interval:    QTC Calculation:   R Axis:      Text Interpretation:           Imaging Studies ordered: I ordered imaging studies including CTA of the chest I independently visualized and interpreted imaging. I agree with the radiologist interpretation   Medicines ordered and prescription drug management: Meds ordered this encounter  Medications   iohexol  (OMNIPAQUE ) 350 MG/ML injection 75 mL  lactated ringers  bolus 1,000 mL    -I have reviewed the patients home medicines and have made adjustments as needed   Cardiac Monitoring: The patient was maintained on a cardiac monitor.  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

## 2024-04-17 NOTE — H&P (Signed)
 History and Physical    Patient: Andres Fernandez DOB: 1956-12-07 DOA: 04/17/2024 DOS: the patient was seen and examined on 04/17/2024 PCP: [name], FNP  Patient coming from: Home  Chief Complaint:  Chief Complaint  Patient presents with   Shortness of Breath   HPI: Andres Fernandez is a 68 y.o. 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 Patient presented secondary to recurrent and  progressively worsening dyspnea on exertion. Symptoms initially started three weeks ago with progressively worsening dyspnea. Patient went to urgent care for management and was diagnosed with pneumonia; he was treated with steroids and antibiotics. Symptoms initially improved with treatment, however symptoms then worsened yesterday with recurrent and worsening dyspnea on exertion. Associated cough, sneezing and diarrhea. No sick contacts. Patient went to urgent care and was diagnosed with influenza with concern for possible pneumonia and sent to the emergency department for evaluation.   Review of Systems: As mentioned in the history of present illness. All other systems reviewed and are negative. 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 He has never used smokeless tobacco. He reports that he does not currently use alcohol. He reports that he does not use drugs.  No Known  Allergies  Family History  Problem Relation Age of Onset   COPD Mother    Liver cancer Mother     Prior to Admission medications   Medication Sig Start Date End Date Taking? Authorizing Provider  acyclovir  (ZOVIRAX ) 400 MG tablet Take 1 tablet (400 mg total) by mouth daily. 08/03/23  Yes [name], MD  albuterol  (VENTOLIN  HFA) 108 [phone] Base) MCG/ACT inhaler Inhale 2 puffs into the lungs every 6 (six) hours as needed for shortness of breath. 04/02/24  Yes [provider]  aspirin  EC 81 MG tablet Take 1 tablet (81 mg total) by mouth daily. 11/28/19  Yes [name], MD  lenalidomide  (REVLIMID ) 10 MG capsule Take 1 capsule (10 mg total) by mouth daily. Take 1 capsule (10 mg total) by mouth daily for 21 days. Take 7 days off. Repeat. 04/08/24  Yes [name], MD  lidocaine -prilocaine  (EMLA ) cream Apply 1 Application topically as needed. Apply to Port-A-Cath at least 1 hr prior to treatment. 01/18/24  Yes [name], MD  Multiple Vitamins-Minerals (MULTIVITAMIN WITH MINERALS) tablet Take 1 tablet by mouth at bedtime.   Yes [provider]  ferrous sulfate  325 (65 FE) MG EC tablet TAKE 1 TABLET BY MOUTH EVERY DAY WITH BREAKFAST Patient not taking: Reported on 11/23/2023 02/20/23   Thayil, Irene T, PA-C  Fluticasone Propionate , Inhal, (FLUTICASONE PROPIONATE  DISKUS) 250 MCG/ACT AEPB TAKE 1 PUFF BY MOUTH EVERY DAY Patient not taking: Reported on 04/17/2024 09/28/23   [name], MD  nicotine  (NICODERM CQ  - DOSED IN MG/24 HOURS) 14 mg/24hr patch PLACE 1 PATCH (14 MG TOTAL) ONTO THE SKIN DAILY.  Patient not taking: Reported on 04/17/2024 09/28/23   [name], MD  Vitamin D , Ergocalciferol , (DRISDOL ) 1.25 MG (50000 UNIT) CAPS capsule Take 1 capsule (50,000 Units total) by mouth once a week. Patient not taking: Reported on 04/17/2024 09/28/23   [name], MD    Physical Exam: BP (!) 140/90   Pulse 78   Temp 99.1 F (37.3 C)  (Oral)   Resp (!) 24   Ht 5\' 8"  (1.727 m)   Wt 65.8 kg   SpO2 99%   BMI 22.05 kg/m   General exam: Appears calm and comfortable and in no acute distress. Conversant Respiratory: Left sided rhonchi, otherwise clear to auscultation. Respiratory effort normal with no intercostal retractions or use of accessory muscles Cardiovascular: S1 & S2 heard, RRR. No murmurs, rubs, gallops or clicks. No LE edema Gastrointestinal: Abdomen is non-distended, soft and non-tender. No masses felt. Normal bowel sounds heard Neurologic: No focal neurological deficits Musculoskeletal: No calf tenderness Skin: No cyanosis. No new rashes Psychiatry: Alert and oriented x4. Memory intact. Mood & affect appropriate   Data Reviewed: There are no new results to review at this time.  Assessment and Plan:  AKI on CKD stage IIIa Baseline creatinine of about 1.4-1.6. Creatinine up to 1.99 on admission secondary to poor oral intake from illness. -IV fluids -BMP in AM  Dyspnea on exertion No hypoxia. Significant dyspnea after a few feet of ambulation. CT PE negative for acute PE. -Ambulate with PT/OT -Ambulatory pulse ox  Influenza infection Start Tamiflu  x5 days  Plasma cell leukemia/multiple myeloma Patient follows with medical oncology, Dr. Salomon Cree. Discussed and recommendation to defer bronchoscopy to outpatient since patient currently has dyspnea with influenza infection. Dr. Salomon Cree to follow-up in AM. -Continue lenalidomide    Advance Care Planning:   Code Status: Full Code  Consults: Medical oncology  Family Communication: None at bedside   Author: Aneita Keens, MD 04/17/2024 2:43 PM  For on call review [email or web address].

## 2024-04-17 NOTE — ED Notes (Addendum)
 Spoke with steve, rn aware patient is on isolation for Flu B

## 2024-04-18 ENCOUNTER — Other Ambulatory Visit (HOSPITAL_COMMUNITY): Payer: Self-pay

## 2024-04-18 DIAGNOSIS — J111 Influenza due to unidentified influenza virus with other respiratory manifestations: Secondary | ICD-10-CM | POA: Insufficient documentation

## 2024-04-18 DIAGNOSIS — N179 Acute kidney failure, unspecified: Secondary | ICD-10-CM | POA: Diagnosis not present

## 2024-04-18 DIAGNOSIS — R0609 Other forms of dyspnea: Secondary | ICD-10-CM | POA: Insufficient documentation

## 2024-04-18 LAB — BASIC METABOLIC PANEL WITH GFR
Anion gap: 11 (ref 5–15)
BUN: 31 mg/dL — ABNORMAL HIGH (ref 8–23)
CO2: 18 mmol/L — ABNORMAL LOW (ref 22–32)
Calcium: 8 mg/dL — ABNORMAL LOW (ref 8.9–10.3)
Chloride: 108 mmol/L (ref 98–111)
Creatinine, Ser: 1.19 mg/dL (ref 0.61–1.24)
GFR, Estimated: >60 mL/min (ref >60)
Glucose, Bld: 83 mg/dL (ref 70–99)
Potassium: 4.5 mmol/L (ref 3.5–5.1)
Sodium: 137 mmol/L (ref 135–145)

## 2024-04-18 LAB — HIV ANTIBODY (ROUTINE TESTING W REFLEX): HIV Screen 4th Generation wRfx: NONREACTIVE

## 2024-04-18 MED ORDER — OSELTAMIVIR PHOSPHATE 75 MG PO CAPS
75.0000 mg | ORAL_CAPSULE | Freq: Two times a day (BID) | ORAL | 0 refills | Status: AC
  Filled 2024-04-18: qty 8, 4d supply, fill #0

## 2024-04-18 NOTE — Plan of Care (Signed)

## 2024-04-18 NOTE — Care Management Obs Status (Signed)
 MEDICARE OBSERVATION STATUS NOTIFICATION   Patient Details  Name: Andres Fernandez MRN: [id number] Date of Birth: [birth year]   Medicare Observation Status Notification Given:  Yes    Zenon Hilda, LCSW 04/18/2024, 2:20 PM

## 2024-04-18 NOTE — Evaluation (Signed)
 Physical Therapy One Time Evaluation Patient Details Name: Andres Fernandez MRN: [id number] DOB: 08/30/1956 Today's Date: 04/18/2024  History of Present Illness  68 y.o. 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  Patient went to urgent care and was diagnosed with influenza with concern for possible pneumonia and sent to the emergency department for evaluation. Patient presented secondary to recurrent and progressively worsening dyspnea on exertion.  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  Clinical Impression  Patient evaluated by Physical Therapy with no further acute PT needs identified. All education has been completed and the patient has no further questions.  See below for any follow-up Physical Therapy or equipment needs. PT is signing off. Thank you for this referral.         If plan is discharge home, recommend the following:     Can travel by private vehicle        Equipment Recommendations None recommended by PT  Recommendations for Other Services       Functional Status Assessment Patient has not had a recent decline in their functional status     Precautions / Restrictions Precautions Precautions: Fall      Mobility  Bed Mobility Overal bed mobility: Modified Independent                  Transfers Overall transfer level: Modified independent Equipment used: None                    Ambulation/Gait Ambulation/Gait assistance: Modified independent (Device/Increase time), Supervision Gait Distance (Feet): 400 Feet Assistive device: IV Pole Gait Pattern/deviations: Step-through pattern, Decreased stride length Gait velocity: decr     General Gait Details: pt pushed IV pole,  required several standing rest breaks due to dypsnea and fatigue however SPO2 92-93% on room air  Stairs            Wheelchair Mobility     Tilt Bed    Modified Rankin (Stroke Patients Only)       Balance Overall balance assessment: No apparent balance deficits (not formally assessed)                                           Pertinent Vitals/Pain Pain Assessment Pain Assessment: Faces Faces Pain Scale: Hurts little more Pain Location: left patella "kneecap"  (states he has cancer here and possibly will get radiation?) Pain Descriptors / Indicators: Sharp, Other (Comment) (intermittent, quick onset then went away with rest break) Pain Intervention(s): Repositioned, Monitored during session    Home Living Family/patient expects to be discharged to:: Private residence Living Arrangements: Alone Available Help at Discharge: Friend(s);Available PRN/intermittently Type of Home: Apartment Home Access: Level entry       Home Layout: One level Home Equipment: Agricultural consultant (2 wheels)      Prior Function Prior Level of Function : Independent/Modified Independent               ADLs Comments: Patient does not drive, has a SO that assists as needed with transportation.     Extremity/Trunk Assessment   Upper Extremity Assessment Upper Extremity Assessment: Overall WFL for tasks assessed    Lower Extremity Assessment Lower  Extremity Assessment: Overall WFL for tasks assessed    Cervical / Trunk Assessment Cervical / Trunk Assessment: Normal  Communication   Communication Communication: (P) No apparent difficulties    Cognition Arousal: (P) Alert Behavior During Therapy: (P) WFL for tasks assessed/performed   PT - Cognitive impairments: (P) No apparent impairments                         Following commands: (P) Intact       Cueing Cueing Techniques: (P) Verbal cues     General Comments      Exercises      Assessment/Plan    PT Assessment Patient does not need any further PT services  PT Problem List         PT Treatment Interventions      PT Goals (Current goals can be found in the Care Plan section)  Acute Rehab PT Goals PT Goal Formulation: All assessment and education complete, DC therapy    Frequency       Co-evaluation               AM-PAC PT "6 Clicks" Mobility  Outcome Measure Help needed turning from your back to your side while in a flat bed without using bedrails?: None Help needed moving from lying on your back to sitting on the side of a flat bed without using bedrails?: None Help needed moving to and from a bed to a chair (including a wheelchair)?: None Help needed standing up from a chair using your arms (e.g., wheelchair or bedside chair)?: None Help needed to walk in hospital room?: None Help needed climbing 3-5 steps with a railing? : A Little 6 Click Score: 23    End of Session   Activity Tolerance: Patient tolerated treatment well Patient left: in bed;with call bell/phone within reach   PT Visit Diagnosis: Difficulty in walking, not elsewhere classified (R26.2)    Time: 1018-1030 PT Time Calculation (min) (ACUTE ONLY): 12 min   Charges:   PT Evaluation $PT Eval Low Complexity: 1 Low   PT General Charges $$ ACUTE PT VISIT: 1 Visit        Blanch Bunde, DPT Physical Therapist Acute Rehabilitation Services Office: [phone]   Myna Asal Payson 04/18/2024, 12:23 PM

## 2024-04-18 NOTE — Plan of Care (Signed)

## 2024-04-18 NOTE — Discharge Instructions (Signed)
 Andres Fernandez,  You were in the hospital with breathing issues. This seems to be related to your influenza infection. Thankfully, your symptoms have improved. While you were here, it was noted that you appear to have an area of your lung highly concerning for cancer. Your cancer doctor is aware and the pulmonologist (lung doctor) plans to see you in their office to coordinate a biopsy.

## 2024-04-18 NOTE — TOC CM/SW Note (Signed)
 Transition of Care [facility]) - Inpatient Brief Assessment  Patient Details  Name: Andres Fernandez MRN: [id number] Date of Birth: 02-21-1956  [facility] CM/SW Contact:    Zenon Hilda, LCSW Phone Number: 04/18/2024, 12:57 PM  Clinical Narrative: PT/OT evaluations did not recommend any follow up or DME. No TOC needs identified at this time.  Transition of Care Asessment: Insurance and Status: Insurance coverage has been reviewed Patient has primary care physician: Yes Home environment has been reviewed: Resides alone in an apartment Prior level of function:: Independent with ADLs Prior/Current Home Services: No current home services Social Drivers of Health Review: SDOH reviewed no interventions necessary Readmission risk has been reviewed: Yes Transition of care needs: no transition of care needs at this time

## 2024-04-18 NOTE — Discharge Summary (Signed)
 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 *  Hospital Course: Andres Fernandez is a 68 y.o. 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  Patient presented secondary to dyspnea on exertion and found to have evidence of influenza and an AKI. Tamiflu  started and IV fluids started. During workup, patient was found to have evidence of an enlarging right lower lobe mass with concern for malignancy; outpatient PET-CT scan confirms concern for bronchogenic carcinoma. Pulmonology consulted and will arrange outpatient bronchoscopy. Medical oncology consulted and will arrange close outpatient follow-up. Patient improved overnight with IV fluids. Dyspnea resolved prior to discharge.  Assessment and Plan:  AKI on CKD stage IIIa Baseline creatinine of about 1.4-1.6. Creatinine up to 1.99 on admission secondary to poor oral intake from illness. AKI resolved with IV fluids.   Dyspnea on exertion No hypoxia. Significant dyspnea after a few feet of ambulation. CT PE negative for acute PE. Symptoms improved prior to discharge.   Influenza infection Start Tamiflu  x5 days. Continue on discharge.   Plasma cell leukemia/multiple myeloma Patient follows with medical oncology, Dr. Salomon Cree. Discussed and  recommendation to defer bronchoscopy to outpatient since patient currently has dyspnea with influenza infection. Dr. Salomon Cree to follow-up in AM. Hold lenalidomide  on discharge until follow-up with medical oncology.  Right lower lobe mass Concerning for bronchogenic carcinoma. Correlates with positive PET-CT results. Pulmonology and medical oncology to follow-up as an outpatient.   Consultants: None Procedures performed: None  Disposition: Home Diet recommendation: Regular diet   DISCHARGE MEDICATION: Allergies as of 04/18/2024   No Known Allergies      Medication List     PAUSE taking these medications    lenalidomide  10 MG capsule Wait to take this until your doctor or other care provider tells you to start again. Commonly known as: REVLIMID  Take 1 capsule (10 mg total) by mouth daily. Take 1 capsule (10 mg total) by mouth daily for 21 days. Take 7 days off. Repeat.       TAKE these medications    acyclovir  400 MG tablet Commonly known as: ZOVIRAX  Take 1 tablet (400 mg total) by mouth daily.   albuterol  108 (90 Base) MCG/ACT inhaler Commonly known as: VENTOLIN  HFA Inhale 2 puffs into the lungs every 6 (six) hours as needed for shortness of breath.   aspirin  EC 81 MG tablet Take 1 tablet (81 mg total) by mouth daily.   ferrous sulfate  325 (65 FE) MG EC tablet TAKE 1 TABLET BY MOUTH EVERY DAY WITH BREAKFAST   Fluticasone Propionate  Diskus 250 MCG/ACT Aepb TAKE 1 PUFF BY MOUTH EVERY DAY   lidocaine -prilocaine  cream Commonly known as: EMLA  Apply 1 Application topically as needed. Apply to Port-A-Cath at least 1 hr prior to treatment.   multivitamin with minerals tablet Take 1 tablet by mouth at bedtime.   nicotine   14 mg/24hr patch Commonly known as: NICODERM CQ  - dosed in mg/24 hours PLACE 1 PATCH (14 MG TOTAL) ONTO THE SKIN DAILY.   oseltamivir  75 MG capsule Commonly known as: TAMIFLU  Take 1 capsule (75 mg total) by mouth 2 (two) times daily for 4 days.    Vitamin D  (Ergocalciferol ) 1.25 MG (50000 UNIT) Caps capsule Commonly known as: DRISDOL  Take 1 capsule (50,000 Units total) by mouth once a week.        Follow-up Information     [name], FNP. Schedule an appointment as soon as possible for a visit in 1 week(s).   Specialty: Family Medicine Why: For hospital follow-up, As needed Contact information: [address] [name] [address] [phone]         [name], MD. Schedule an appointment as soon as possible for a visit.   Specialties: Hematology, Oncology Why: For hospital follow-up Contact information: [address] [place] [address] [phone]                Discharge Exam: BP 123/82 (BP Location: Left Arm)   Pulse 79   Temp 98.4 F (36.9 C) (Oral)   Resp (!) 21   Ht 5\' 8"  (1.727 m)   Wt 65.8 kg   SpO2 100%   BMI 22.05 kg/m   General exam: Appears calm and comfortable Respiratory system: Clear to auscultation. Respiratory effort normal. Cardiovascular system: S1 & S2 heard, RRR. No murmurs. Gastrointestinal system: Abdomen is nondistended, soft and nontender. Normal bowel sounds heard. Central nervous system: Alert and oriented. No focal neurological deficits. Musculoskeletal: No edema. No calf tenderness Psychiatry: Judgement and insight appear normal. Mood & affect appropriate.   Condition at discharge: stable  The results of significant diagnostics from this hospitalization (including imaging, microbiology, ancillary and laboratory) are listed below for reference.   Imaging Studies: NM PET Image Restage (PS) Whole Body Result Date: 04/17/2024 CLINICAL DATA:  Subsequent treatment strategy for multiple myeloma. EXAM: NUCLEAR MEDICINE PET WHOLE BODY TECHNIQUE: 7.65 mCi F-18 FDG was injected intravenously. Full-ring PET imaging was performed from the head to foot after the radiotracer. CT data was obtained and used for attenuation correction and anatomic  localization. Fasting blood glucose: 101 mg/dl COMPARISON:  PET-CT 84/13/2440. Chest CTA 08/23/2022 and 04/17/2024. FINDINGS: Mediastinal blood pool activity: SUV max 1.9 HEAD/ NECK: No hypermetabolic cervical lymph nodes are identified.Fairly symmetric activity within the lymphoid tissue of Waldeyer's ring is within physiologic limits. No suspicious activity identified within the pharyngeal mucosal space. Incidental CT findings: Bilateral carotid atherosclerosis. CHEST: Hypermetabolic spiculated right lower lobe nodule has enlarged from previous chest CT, measuring 2.8 x 2.8 cm on image 52/7 and demonstrates an SUV max of 11.8. There is a satellite nodule measuring 1.9 cm on image 50/7 which is also hypermetabolic (SUV max 5.7). Mildly increased metabolic activity extending proximally along the right lower lobe bronchus. There is a small hypermetabolic right hilar lymph node (SUV max 6.1). No other hypermetabolic thoracic lymph nodes identified. There is no hypermetabolic activity within the left hemithorax. However, there are potentially new other small right upper and lower lobe nodules (images 43 and 37, respectively). Small subpleural left lower lobe nodule on image 58/7 was obscured by parenchymal opacity on the most recent CT, although is grossly unchanged from the remote PET-CT. Incidental CT findings: Severe centrilobular and paraseptal emphysema. Right IJ Port-A-Cath extends to the superior cavoatrial junction. Mild aortic atherosclerosis. ABDOMEN/PELVIS: There is no hypermetabolic activity within the liver,  adrenal glands, spleen or pancreas. There is no hypermetabolic nodal activity in the abdomen or pelvis. There is focal hypermetabolic activity within the left mid abdomen corresponding with small-bowel loops (SUV max 15.3). Based on the configuration of this activity and the lack of any clear corresponding CT abnormality, this may be physiologic. Incidental CT findings: Aortic and branch vessel  atherosclerosis. Penile prosthesis and reservoir are noted. Mild chronic bladder wall thickening. SKELETON: No hypermetabolic osseous lesions are demonstrated to suggest active multiple myeloma. Multiple lytic lesions are again noted throughout the skull, scapula, sternum, ribs, spine and bony pelvis consistent with underlying multiple myeloma. No evidence of osseous metastatic disease. Incidental CT findings: Healing right-sided rib fractures. EXTREMITIES: No hypermetabolic osseous lesions are identified within the extremities. Scattered lucent lesions in the proximal appendicular skeleton may relate to underlying multiple myeloma. Incidental CT findings: Previous right femoral ORIF. IMPRESSION: 1. Hypermetabolic spiculated right lower lobe pulmonary nodule has enlarged from previous chest CT, consistent with primary bronchogenic carcinoma. There is a satellite nodule and hypermetabolic right hilar lymph node, suspicious for metastatic disease. 2. Potential new small right upper and lower lobe nodules, indeterminate. 3. No evidence of hypermetabolic metastatic disease in the abdomen or pelvis. 4. Focal hypermetabolic activity within the left mid abdomen corresponding with small-bowel loops, favored to be physiologic. 5. Multiple chronic lytic lesions throughout the axial and appendicular skeleton consistent with underlying multiple myeloma, without associated hypermetabolic activity. No evidence of hypermetabolic osseous metastatic disease. 6. Aortic Atherosclerosis (ICD10-I70.0) and Emphysema (ICD10-J43.9). Electronically Signed   By: Elmon Hagedorn M.D.   On: 04/17/2024 14:28   CT Angio Chest PE W and/or Wo Contrast Result Date: 04/17/2024 CLINICAL DATA:  Pulmonary embolism (PE) suspected, high prob History of multiple myeloma. EXAM: CT ANGIOGRAPHY CHEST WITH CONTRAST TECHNIQUE: Multidetector CT imaging of the chest was performed using the standard protocol during bolus administration of intravenous contrast.  Multiplanar CT image reconstructions and MIPs were obtained to evaluate the vascular anatomy. RADIATION DOSE REDUCTION: This exam was performed according to the departmental dose-optimization program which includes automated exposure control, adjustment of the mA and/or kV according to patient size and/or use of iterative reconstruction technique. CONTRAST:  70mL OMNIPAQUE  IOHEXOL  350 MG/ML SOLN COMPARISON:  PET-CT 04/11/2024.  Chest CTA 08/23/2022. FINDINGS: Cardiovascular: The pulmonary arteries are well opacified with contrast to the level of the segmental branches. There is no evidence of acute pulmonary embolism. Mild aortic atherosclerosis without evidence of acute systemic arterial abnormality. The heart size is normal. There is no pericardial effusion. Mediastinum/Nodes: There are no enlarged mediastinal, hilar or axillary lymph nodes.Mildly prominent mediastinal and hilar lymph nodes appear similar to previous CT. The thyroid gland, trachea and esophagus demonstrate no significant findings. Lungs/Pleura: No pleural effusion or pneumothorax. Severe centrilobular and paraseptal emphysema. Spiculated right lower lobe mass measuring 3.1 x 2.9 cm on image 111/12 has enlarged from the previous CT and was hypermetabolic on PET-CT. There is a satellite spiculated nodule measuring 2.1 x 1.8 cm on image 103/12. Possible additional other new smaller pulmonary nodules in the right lower lobe (image 75/12), the right upper lobe (image 81/12) and the left lower lobe (image 120/12). No confluent airspace disease. Upper abdomen: No significant findings are seen in the visualized upper abdomen. Musculoskeletal/Chest wall: There is no chest wall mass or suspicious osseous finding. Review of the MIP images confirms the above findings. IMPRESSION: 1. No evidence of acute pulmonary embolism or other acute vascular findings in the chest. 2. Enlarging spiculated right lower lobe  mass with satellite nodule and possible additional  new smaller pulmonary nodules, highly suspicious for primary bronchogenic carcinoma with metastatic disease. See forthcoming PET-CT report. 3. Aortic Atherosclerosis (ICD10-I70.0) and Emphysema (ICD10-J43.9). Electronically Signed   By: Elmon Hagedorn M.D.   On: 04/17/2024 14:09   DG Chest 2 View Result Date: 04/01/2024 EXAM: 2 VIEW XRAY OF THE CHEST 04/18/202504:51:20 PM COMPARISON: CTA Chest dated 08/23/2022. CLINICAL HISTORY: SOB while supine. Pt c/o SOB when supine and during exertion. Pt states he has knee pain x1 wk, pain when standing on leg. Remission for multiple myeloma/plasma cell leukemia which was located in right femur. Denies injury. FINDINGS: LUNGS AND PLEURA: Hyperinflated lungs suggesting emphysematous changes. Nodular opacity at right lung base. The right lung nodule is progressive from prior CT. No pleural effusion. No pneumothorax. HEART AND MEDIASTINUM: Right port-a-cath in place with tip in the distal SVC. No acute abnormality of the cardiac silhouette. BONES AND SOFT TISSUES: No acute osseous abnormality. IMPRESSION: 1. Emphysematous changes in the lungs. 2. Nodular opacity at right lung base, progressive from prior CT. Electronically signed by: Zadie Herter MD 04/01/2024 02:41 PM EDT RP Workstation: [id number]   DG Knee 1-2 Views Right Result Date: 04/01/2024 EXAM: 2 VIEW(S) XRAY OF THE RIGHT KNEE 03/28/2024 04:51:20 PM COMPARISON: None available. CLINICAL HISTORY: Right knee pain. Patient complains of shortness of breath when supine and during exertion. Patient states he has knee pain for 1 week, pain when standing on leg. Remission for multiple myeloma/plasma cell leukemia which was located in right femur. Denies injury. FINDINGS: BONES AND JOINTS: No focal osseous lesion. No joint dislocation. No significant joint effusion. Small lytic lesions in the proximal fibula, likely related to the patient's known multiple myeloma. The femoral shaft is not adequately visualized. SOFT  TISSUES: The soft tissues are unremarkable. IMPRESSION: 1. Small lytic lesions in the proximal fibula, likely related to the patient's known multiple myeloma. 2. Nail in the right femoral shaft, incompletely visualized. Electronically signed by: Zadie Herter MD 04/01/2024 02:32 PM EDT RP Workstation: [id number]    Microbiology: Results for orders placed or performed during the hospital encounter of 08/23/22  Resp Panel by RT-PCR (Flu A&B, Covid) Anterior Nasal Swab     Status: None   Collection Time: 08/23/22  8:35 AM   Specimen: Anterior Nasal Swab  Result Value Ref Range Status   SARS Coronavirus 2 by RT PCR NEGATIVE NEGATIVE Final    Comment: (NOTE) SARS-CoV-2 target nucleic acids are NOT DETECTED.  The SARS-CoV-2 RNA is generally detectable in upper respiratory specimens during the acute phase of infection. The lowest concentration of SARS-CoV-2 viral copies this assay can detect is 138 copies/mL. A negative result does not preclude SARS-Cov-2 infection and should not be used as the sole basis for treatment or other patient management decisions. A negative result may occur with  improper specimen collection/handling, submission of specimen other than nasopharyngeal swab, presence of viral mutation(s) within the areas targeted by this assay, and inadequate number of viral copies(<138 copies/mL). A negative result must be combined with clinical observations, patient history, and epidemiological information. The expected result is Negative.  Fact Sheet for Patients:  BloggerCourse.com  Fact Sheet for Healthcare Providers:  SeriousBroker.it  This test is no t yet approved or cleared by the [place]  FDA and  has been authorized for detection and/or diagnosis of SARS-CoV-2 by FDA under an Emergency Use Authorization (EUA). This EUA will remain  in effect (meaning this test can be used) for the duration of  the COVID-19  declaration under Section 564(b)(1) of the Act, 21 U.S.C.section 360bbb-3(b)(1), unless the authorization is terminated  or revoked sooner.       Influenza A by PCR NEGATIVE NEGATIVE Final   Influenza B by PCR NEGATIVE NEGATIVE Final    Comment: (NOTE) The Xpert Xpress SARS-CoV-2/FLU/RSV plus assay is intended as an aid in the diagnosis of influenza from Nasopharyngeal swab specimens and should not be used as a sole basis for treatment. Nasal washings and aspirates are unacceptable for Xpert Xpress SARS-CoV-2/FLU/RSV testing.  Fact Sheet for Patients: BloggerCourse.com  Fact Sheet for Healthcare Providers: SeriousBroker.it  This test is not yet approved or cleared by the [place]  FDA and has been authorized for detection and/or diagnosis of SARS-CoV-2 by FDA under an Emergency Use Authorization (EUA). This EUA will remain in effect (meaning this test can be used) for the duration of the COVID-19 declaration under Section 564(b)(1) of the Act, 21 U.S.C. section 360bbb-3(b)(1), unless the authorization is terminated or revoked.  Performed at [facility], [address], [place], [place] [address]    *Note: Due to a large number of results and/or encounters for the requested time period, some results have not been displayed. A complete set of results can be found in Results Review.    Labs: CBC: Recent Labs  Lab 04/17/24 1218  WBC 4.1  NEUTROABS 1.9  HGB 13.6  HCT 41.8  MCV 90.1  PLT 136*   Basic Metabolic Panel: Recent Labs  Lab 04/17/24 1218 04/18/24 0345  NA 138 137  K 4.4 4.5  CL 105 108  CO2 21* 18*  GLUCOSE 94 83  BUN 33* 31*  CREATININE 1.99* 1.19  CALCIUM 8.2* 8.0*   Liver Function Tests: Recent Labs  Lab 04/17/24 1218  AST 24  ALT 17  ALKPHOS 49  BILITOT 0.6  PROT 6.9  ALBUMIN 3.5    Discharge time spent: 35 minutes.  Signed: Aneita Keens, MD [facility] 04/18/2024

## 2024-04-18 NOTE — Telephone Encounter (Signed)
 Per Secure chat from Dr. Barrie Borer can we please schedule this patient with Dr. Darnelle Elders next week for a RLL mass  Appt has been scheduled for 5/14 at 10:30am.  Nothing further needed.

## 2024-04-18 NOTE — Plan of Care (Signed)
  Problem: Education: Goal: Knowledge of General Education information will improve Description: Including pain rating scale, medication(s)/side effects and non-pharmacologic comfort measures 04/18/2024 1602 by Coletta Davidson, RN Outcome: Adequate for Discharge 04/18/2024 1338 by Coletta Davidson, RN Outcome: Progressing   Problem: Health Behavior/Discharge Planning: Goal: Ability to manage health-related needs will improve 04/18/2024 1602 by Coletta Davidson, RN Outcome: Adequate for Discharge 04/18/2024 1338 by Coletta Davidson, RN Outcome: Progressing   Problem: Clinical Measurements: Goal: Ability to maintain clinical measurements within normal limits will improve 04/18/2024 1602 by Coletta Davidson, RN Outcome: Adequate for Discharge 04/18/2024 1338 by Coletta Davidson, RN Outcome: Progressing Goal: Will remain free from infection 04/18/2024 1602 by Coletta Davidson, RN Outcome: Adequate for Discharge 04/18/2024 1338 by Coletta Davidson, RN Outcome: Progressing Goal: Diagnostic test results will improve 04/18/2024 1602 by Coletta Davidson, RN Outcome: Adequate for Discharge 04/18/2024 1338 by Coletta Davidson, RN Outcome: Progressing Goal: Respiratory complications will improve 04/18/2024 1602 by Coletta Davidson, RN Outcome: Adequate for Discharge 04/18/2024 1338 by Coletta Davidson, RN Outcome: Progressing Goal: Cardiovascular complication will be avoided 04/18/2024 1602 by Coletta Davidson, RN Outcome: Adequate for Discharge 04/18/2024 1338 by Coletta Davidson, RN Outcome: Progressing   Problem: Activity: Goal: Risk for activity intolerance will decrease 04/18/2024 1602 by Coletta Davidson, RN Outcome: Adequate for Discharge 04/18/2024 1338 by Coletta Davidson, RN Outcome: Progressing   Problem: Nutrition: Goal: Adequate nutrition will be maintained 04/18/2024 1602 by Coletta Davidson, RN Outcome: Adequate for  Discharge 04/18/2024 1338 by Coletta Davidson, RN Outcome: Progressing   Problem: Coping: Goal: Level of anxiety will decrease 04/18/2024 1602 by Coletta Davidson, RN Outcome: Adequate for Discharge 04/18/2024 1338 by Coletta Davidson, RN Outcome: Progressing   Problem: Elimination: Goal: Will not experience complications related to bowel motility 04/18/2024 1602 by Coletta Davidson, RN Outcome: Adequate for Discharge 04/18/2024 1338 by Coletta Davidson, RN Outcome: Progressing Goal: Will not experience complications related to urinary retention 04/18/2024 1602 by Coletta Davidson, RN Outcome: Adequate for Discharge 04/18/2024 1338 by Coletta Davidson, RN Outcome: Progressing   Problem: Pain Managment: Goal: General experience of comfort will improve and/or be controlled 04/18/2024 1602 by Coletta Davidson, RN Outcome: Adequate for Discharge 04/18/2024 1338 by Coletta Davidson, RN Outcome: Progressing   Problem: Safety: Goal: Ability to remain free from injury will improve 04/18/2024 1602 by Coletta Davidson, RN Outcome: Adequate for Discharge 04/18/2024 1338 by Coletta Davidson, RN Outcome: Progressing   Problem: Skin Integrity: Goal: Risk for impaired skin integrity will decrease 04/18/2024 1602 by Coletta Davidson, RN Outcome: Adequate for Discharge 04/18/2024 1338 by Coletta Davidson, RN Outcome: Progressing

## 2024-04-18 NOTE — Progress Notes (Signed)
 Prior-To-Admission Oral Chemotherapy for Treatment of Oncologic Disease   Per Dr Salomon Cree: hold Revlimid  until f/u clinic appointment   Rubie Corona, Pharm.D Use secure chat for questions 04/18/2024 8:50 AM

## 2024-04-18 NOTE — Hospital Course (Signed)
 JOSHIAH SCHAAL is a 68 y.o. 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  Patient presented secondary to dyspnea on exertion and found to have evidence of influenza and an AKI. Tamiflu  started and IV fluids started. During workup, patient was found to have evidence of an enlarging right lower lobe mass with concern for malignancy; outpatient PET-CT scan confirms concern for bronchogenic carcinoma. Pulmonology consulted and will arrange outpatient bronchoscopy. Medical oncology consulted and will arrange close outpatient follow-up. Patient improved overnight with IV fluids. Dyspnea resolved prior to discharge.

## 2024-04-18 NOTE — Evaluation (Signed)
 Occupational Therapy Evaluation Patient Details Name: Andres Fernandez MRN: [id number] DOB: August 12, 1956 Today's Date: 04/18/2024   History of Present Illness   68 y.o. 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 Patient presented secondary to recurrent and  progressively worsening dyspnea on exertion.  CT Chest: Enlarging spiculated right lower lobe mass with satellite nodule  and possible additional new smaller pulmonary nodules, highly  suspicious for primary bronchogenic carcinoma with metastatic  disease.     Clinical Impressions Patient admitted for the diagnosis above.  Unfortunately a chest CT has uncovered possible lung CA.  Patient subjectively feels much better, he is not complaining about SOB, and is moving quite well in his room.  Patient appears at his baseline for in room mobility/toileting and ADL completion.  No OT needs uncovered.  Defer to mobility and PT for continued ambulation.  No post acute OT anticipated.       If plan is discharge home, recommend the following:   Assist for transportation     Functional Status Assessment   Patient has not had a recent decline in their functional status     Equipment Recommendations   None recommended by OT     Recommendations for Other Services         Precautions/Restrictions   Precautions Precautions: Fall Precaution/Restrictions Comments: Statng he is on active Chemo Restrictions Weight Bearing Restrictions Per Provider Order: No     Mobility Bed Mobility               General bed mobility comments: up in recliner    Transfers Overall transfer level: Modified independent Equipment used: None                      Balance Overall balance assessment: No apparent balance deficits (not formally assessed)                                         ADL either performed  or assessed with clinical judgement   ADL Overall ADL's : At baseline                                             Vision Patient Visual Report: No change from baseline       Perception Perception: Not tested       Praxis Praxis: Not tested       Pertinent Vitals/Pain Pain Assessment Pain Assessment: No/denies pain     Extremity/Trunk Assessment Upper Extremity Assessment Upper Extremity Assessment: Overall WFL for tasks assessed   Lower Extremity Assessment Lower Extremity Assessment: Defer to PT evaluation   Cervical / Trunk Assessment Cervical / Trunk Assessment: Normal   Communication Communication Communication: No apparent difficulties   Cognition Arousal: Alert Behavior During Therapy: WFL for tasks assessed/performed Cognition: No apparent impairments                               Following commands: Intact       Cueing  General Comments   Cueing Techniques: Verbal cues   VSS on RA   Exercises     Shoulder Instructions  Home Living Family/patient expects to be discharged to:: Private residence Living Arrangements: Alone Available Help at Discharge: Friend(s);Available PRN/intermittently Type of Home: Apartment Home Access: Level entry     Home Layout: One level     Bathroom Shower/Tub: Chief Strategy Officer: Standard Bathroom Accessibility: Yes   Home Equipment: Agricultural consultant (2 wheels)          Prior Functioning/Environment Prior Level of Function : Independent/Modified Independent               ADLs Comments: Patient does not drive, has a SO that assists as needed with transportation.    OT Problem List: Decreased activity tolerance   OT Treatment/Interventions:        OT Goals(Current goals can be found in the care plan section)   Acute Rehab OT Goals Patient Stated Goal: Most concerned about new CA diagnosis OT Goal Formulation: With patient Time For Goal  Achievement: 04/25/24 Potential to Achieve Goals: Good   OT Frequency:       Co-evaluation              AM-PAC OT "6 Clicks" Daily Activity     Outcome Measure Help from another person eating meals?: None Help from another person taking care of personal grooming?: None Help from another person toileting, which includes using toliet, bedpan, or urinal?: None Help from another person bathing (including washing, rinsing, drying)?: None Help from another person to put on and taking off regular upper body clothing?: None Help from another person to put on and taking off regular lower body clothing?: None 6 Click Score: 24   End of Session Nurse Communication: Mobility status  Activity Tolerance: Patient tolerated treatment well Patient left: in chair;with call bell/phone within reach  OT Visit Diagnosis: Muscle weakness (generalized) (M62.81)                Time: 4098-1191 OT Time Calculation (min): 17 min Charges:  OT General Charges $OT Visit: 1 Visit OT Evaluation $OT Eval Moderate Complexity: 1 Mod  04/18/2024  RP, OTR/L  Acute Rehabilitation Services  Office:  [phone]   Andres Fernandez 04/18/2024, 8:48 AM

## 2024-04-21 ENCOUNTER — Telehealth: Payer: Self-pay | Admitting: *Deleted

## 2024-04-21 NOTE — Telephone Encounter (Signed)
 Returned PC to patient, he called earlier asking about possible lung bx & when this would scheduled, what plan of care will be going forward.  Informed patient he has appointment with [facility] on 04/23/24 at 10:30 in [place].  Dr Salomon Cree canceled his appointments on 04/25/24 for labs & infusion due to patient possibly having bronchoscopy around that time.  His appointments on 05/09/24 will remain the same.  Patient instructed to notify this office when his bronchoscopy is scheduled because Dr Salomon Cree may want a F/U appointment soon afterwards.  Patient verbalizes understanding.

## 2024-04-23 ENCOUNTER — Telehealth: Payer: Self-pay

## 2024-04-23 ENCOUNTER — Ambulatory Visit: Admitting: Student in an Organized Health Care Education/Training Program

## 2024-04-23 ENCOUNTER — Encounter: Payer: Self-pay | Admitting: Student in an Organized Health Care Education/Training Program

## 2024-04-23 ENCOUNTER — Telehealth: Payer: Self-pay | Admitting: *Deleted

## 2024-04-23 VITALS — BP 130/80 | HR 104 | Temp 98.4°F | Ht 68.0 in | Wt 143.8 lb

## 2024-04-23 DIAGNOSIS — R0602 Shortness of breath: Secondary | ICD-10-CM

## 2024-04-23 DIAGNOSIS — F17210 Nicotine dependence, cigarettes, uncomplicated: Secondary | ICD-10-CM | POA: Diagnosis not present

## 2024-04-23 DIAGNOSIS — R918 Other nonspecific abnormal finding of lung field: Secondary | ICD-10-CM

## 2024-04-23 DIAGNOSIS — J432 Centrilobular emphysema: Secondary | ICD-10-CM | POA: Diagnosis not present

## 2024-04-23 MED ORDER — AEROCHAMBER MV MISC: 0 refills | Status: AC

## 2024-04-23 NOTE — Telephone Encounter (Signed)
 Robotic Bronchoscopy with EBUS 04/29/2024 at 10:00am Lung nodule [id number], [id number], [id number]  Andres Fernandez please see Bronch info.   Email has been sent to the bronch team.

## 2024-04-23 NOTE — Progress Notes (Signed)
 Assessment & Plan:   #Right lower lobe lung mass (Primary)  Nodule Location: RLL Nodule Size: 3.2 x 2.9 cm Nodule Spiculation: Yes Associated Lymphadenopathy: yes Smoking Status (former) and pack years: 50 Extrathoracic cancer > 5 years prior (yes): Multiple Myeloma ECOG: 0  The patient is here to discuss their imaging abnormalities which include a large right lower lobe pulmonary nodule with spiculation and FDG avidity on PET scan.  This is associated with a satellite nodule that is similarly FDG avid as well as a right hilar FDG avid lymph node.  This is overall concerning for a primary lung malignancy, especially given the nodule was present on a CT scan from September 2023.  Other possibilities on the differential include multiple myeloma, separate tumor, and less likely an infectious process.  We discussed the importance of diagnosis and staging in lung malignancies, and the approach to obtaining a tissue diagnosis which would include robotic assisted navigational bronchoscopy with endobronchial ultrasound guided sampling.  We also discussed the risks associated with the procedure which include a 2% risk of pneumothorax, infection, bleeding, and nondiagnostic procedure in detail.  I explained that patients typically are able to return home the same day of the procedure, but in rare cases admission to the hospital for observation and treatment is required.  After our discussion, the patient elected to proceed with the procedure  - Procedural/ Surgical Case Request: ROBOTIC ASSISTED NAVIGATIONAL BRONCHOSCOPY, ENDOBRONCHIAL ULTRASOUND (EBUS); Future - CT SUPER D CHEST WO CONTRAST; Future  #Emphysema  Patient is noted to have significant emphysema on his chest CT with some bronchial thickening which could be attributed to his recent bout with influenza.  Given his significant smoking history and current use of albuterol , he is at high risk for COPD which I will workup with a pulmonary  function test.  We will give him a spacer device today and pending findings from the PFT will consider long-acting inhalers with LABA/LAMA +/- ICS.  -Continue Albuterol  with spacer Device -PFT (spirometry, lung volume, DLCO)   I spent 60 minutes caring for this patient today, including preparing to see the patient, obtaining a medical history , reviewing a separately obtained history, performing a medically appropriate examination and/or evaluation, counseling and educating the patient/family/caregiver, ordering medications, tests, or procedures, documenting clinical information in the electronic health record, and independently interpreting results (not separately reported/billed) and communicating results to the patient/family/caregiver  [name], MD [facility]  End of visit medications:  No orders of the defined types were placed in this encounter.    Current Outpatient Medications:    acyclovir  (ZOVIRAX ) 400 MG tablet, Take 1 tablet (400 mg total) by mouth daily., Disp: 90 tablet, Rfl: 6   albuterol  (VENTOLIN  HFA) 108 (90 Base) MCG/ACT inhaler, Inhale 2 puffs into the lungs every 6 (six) hours as needed for shortness of breath., Disp: , Rfl:    aspirin  EC 81 MG tablet, Take 1 tablet (81 mg total) by mouth daily., Disp: 60 tablet, Rfl: 11   [Paused] lenalidomide  (REVLIMID ) 10 MG capsule, Take 1 capsule (10 mg total) by mouth daily. Take 1 capsule (10 mg total) by mouth daily for 21 days. Take 7 days off. Repeat., Disp: 21 capsule, Rfl: 0   lidocaine -prilocaine  (EMLA ) cream, Apply 1 Application topically as needed. Apply to Port-A-Cath at least 1 hr prior to treatment., Disp: 30 g, Rfl: 0   Multiple Vitamins-Minerals (MULTIVITAMIN WITH MINERALS) tablet, Take 1 tablet by mouth at bedtime., Disp: , Rfl:  ferrous sulfate  325 (65 FE) MG EC tablet, TAKE 1 TABLET BY MOUTH EVERY DAY WITH BREAKFAST (Patient not taking: No sig reported), Disp: 90 tablet, Rfl: 1    Fluticasone Propionate , Inhal, (FLUTICASONE PROPIONATE  DISKUS) 250 MCG/ACT AEPB, TAKE 1 PUFF BY MOUTH EVERY DAY (Patient not taking: No sig reported), Disp: 60 each, Rfl: 1   nicotine  (NICODERM CQ  - DOSED IN MG/24 HOURS) 14 mg/24hr patch, PLACE 1 PATCH (14 MG TOTAL) ONTO THE SKIN DAILY. (Patient not taking: Reported on 04/17/2024), Disp: 28 patch, Rfl: 1   Vitamin D , Ergocalciferol , (DRISDOL ) 1.25 MG (50000 UNIT) CAPS capsule, Take 1 capsule (50,000 Units total) by mouth once a week. (Patient not taking: Reported on 04/17/2024), Disp: 12 capsule, Rfl: 6 No current facility-administered medications for this visit.  Facility-Administered Medications Ordered in Other Visits:    acetaminophen  (TYLENOL ) 325 MG tablet, , , ,    famotidine  (PEPCID ) 20 MG tablet, , , ,    Subjective:   PATIENT ID: Andres Fernandez GENDER: male DOB: March 15, 1956, MRN: [id number]  Chief Complaint  Patient presents with   New Patient (Initial Visit)    New: COPD, CT: 04/17/24, x-ray: 04/01/24, DOE, right lower lobe mass.  SOB/DOE for about 2-3 weeks. Some wheeze but no cough. Currently taking an inhaler but it doesn't seem to do much for him -- feels like he's taking shallow breathes.     HPI  The patient is a pleasant 68 year old male with a past medical history of multiple myeloma presenting to clinic for the evaluation of a pulmonary nodule.  Patient presented to the hospital on 04/17/2024 with shortness of breath and increased dyspnea with exertion following a recent diagnosis of influenza at urgent care.  He was dehydrated and was admitted to the hospital for initiation of IV fluids as well as Tamiflu . He is feeling better post discharge but continues to experience shortness of breath with exertion. He has an albuterol  inhaler that he uses occasionally, and doesn't feel resolution of symptoms with it as he did in the past.  Prior to this hospitalization, he had a CXR that showed a nodular opacity in the right lung base. He  then underwent a PET/CT on 04/11/2024 showing a hypermetabolic spiculated right lower lobe nodule with a satellite nodule and hypermetabolic right hilar lymph node all suspicious for malignancy. This was confirmed on his chest CT on 04/17/2024.  Patient is referred to pulmonary for further evaluation and consideration of biopsy of said nodule.  Patient has a history of multiple myeloma, followed closely by Dr. Salomon Cree. His multiple myeloma is in remission status post autologous HSCT.  He is currently on lenalidomide  and carfilzomib  for maintenance  Patient has a history of smoking, having started at the age of 69 and quit 1 year ago.  He has around 50 pack years of smoking history.  He also has history of crack cocaine use but has not been abstinent for 10 years.  Ancillary information including prior medications, full medical/surgical/family/social histories, and PFTs (when available) are listed below and have been reviewed.   Review of Systems  Constitutional:  Negative for chills, fever and weight loss.  HENT:  Negative for hearing loss.   Respiratory:  Positive for shortness of breath. Negative for cough, sputum production and wheezing.   Cardiovascular:  Negative for chest pain.     Objective:   Vitals:   04/23/24 1004  BP: 130/80  Pulse: (!) 104  Temp: 98.4 F (36.9 C)  TempSrc: Oral  SpO2:  95%  Weight: 143 lb 12.8 oz (65.2 kg)  Height: 5\' 8"  (1.727 m)   95% on RA BMI Readings from Last 3 Encounters:  04/23/24 21.86 kg/m  04/17/24 22.05 kg/m  04/11/24 23.49 kg/m   Wt Readings from Last 3 Encounters:  04/23/24 143 lb 12.8 oz (65.2 kg)  04/17/24 145 lb (65.8 kg)  04/11/24 154 lb 8 oz (70.1 kg)    Physical Exam Constitutional:      Appearance: Normal appearance.  Cardiovascular:     Rate and Rhythm: Normal rate and regular rhythm.     Pulses: Normal pulses.     Heart sounds: Normal heart sounds.  Pulmonary:     Effort: Pulmonary effort is normal.     Breath sounds:  Normal breath sounds. No wheezing.  Neurological:     General: No focal deficit present.     Mental Status: He is alert and oriented to person, place, and time. Mental status is at baseline.       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   Tobacco abuse      Family History  Problem Relation Age of Onset   COPD Mother    Liver cancer Mother      Past Surgical History:  Procedure Laterality Date   APPENDECTOMY     FEMUR IM NAIL Right 11/20/2017   Procedure: RIGHT FEMORAL INTRAMEDULLARY (IM) NAIL;  Surgeon: Janeth Medicus, MD;  Location: [facility] OR;  Service: Orthopedics;  Laterality: Right;   IR IMAGING GUIDED PORT INSERTION  08/19/2021    Social History   Socioeconomic History   Marital status: Single    Spouse name: Not on file   Number of children: Not on file   Years of education: Not on file   Highest education level: Not on file  Occupational History   Not on file  Tobacco Use   Smoking status: Some Days    Current packs/day: 0.25    Average packs/day: 0.3 packs/day    Types: Cigarettes    Start date: 04/16/2024    Last attempt to quit: 11/09/2017   Smokeless tobacco: Never   Tobacco comments:    Smokes a cigarette "every once in a while" starting about 2 weeks ago - 04/23/24  Vaping Use   Vaping status: Never Used  Substance and Sexual Activity   Alcohol use: Not Currently   Drug use: Not Currently   Sexual activity: Not on file  Other Topics Concern   Not on file  Social History Narrative   Not on file   Social Drivers of Health   Financial Resource Strain: Not on file  Food Insecurity: No Food Insecurity (04/17/2024)   Hunger Vital Sign    Worried About Running Out of Food in the Last Year: Never true    Ran Out of Food in the Last Year: Never true  Transportation Needs: No  Transportation Needs (04/17/2024)   PRAPARE - Administrator, Civil Service (Medical): No    Lack of Transportation (Non-Medical): No  Physical Activity: Not on file  Stress: Not on file  Social Connections: Moderately Integrated (04/17/2024)   Social Connection and Isolation Panel [NHANES]    Frequency of Communication with Friends and Family: More than three times a week    Frequency of Social Gatherings  with Friends and Family: Three times a week    Attends Religious Services: More than 4 times per year    Active Member of Clubs or Organizations: Yes    Attends Banker Meetings: More than 4 times per year    Marital Status: Divorced  Intimate Partner Violence: Not At Risk (04/17/2024)   Humiliation, Afraid, Rape, and Kick questionnaire    Fear of Current or Ex-Partner: No    Emotionally Abused: No    Physically Abused: No    Sexually Abused: No     No Known Allergies   CBC    Component Value Date/Time   WBC 4.1 04/17/2024 1218   RBC 4.64 04/17/2024 1218   HGB 13.6 04/17/2024 1218   HGB 13.1 04/11/2024 0852   HGB 8.3 (L) 12/14/2017 1230   HCT 41.8 04/17/2024 1218   HCT 23.9 (L) 12/14/2017 1230   PLT 136 (L) 04/17/2024 1218   PLT 377 04/11/2024 0852   PLT 524 (H) 12/14/2017 1230   MCV 90.1 04/17/2024 1218   MCV 88.4 12/14/2017 1230   MCH 29.3 04/17/2024 1218   MCHC 32.5 04/17/2024 1218   RDW 15.1 04/17/2024 1218   RDW 15.3 (H) 12/14/2017 1230   LYMPHSABS 1.4 04/17/2024 1218   LYMPHSABS 1.4 12/14/2017 1230   MONOABS 0.9 04/17/2024 1218   MONOABS 0.6 12/14/2017 1230   EOSABS 0.0 04/17/2024 1218   EOSABS 0.1 12/14/2017 1230   BASOSABS 0.0 04/17/2024 1218   BASOSABS 0.1 12/14/2017 1230    Pulmonary Functions Testing Results:     No data to display          Outpatient Medications Prior to Visit  Medication Sig Dispense Refill   acyclovir  (ZOVIRAX ) 400 MG tablet Take 1 tablet (400 mg total) by mouth daily. 90 tablet 6   albuterol  (VENTOLIN   HFA) 108 (90 Base) MCG/ACT inhaler Inhale 2 puffs into the lungs every 6 (six) hours as needed for shortness of breath.     aspirin  EC 81 MG tablet Take 1 tablet (81 mg total) by mouth daily. 60 tablet 11   lenalidomide  (REVLIMID ) 10 MG capsule Take 1 capsule (10 mg total) by mouth daily. Take 1 capsule (10 mg total) by mouth daily for 21 days. Take 7 days off. Repeat. 21 capsule 0   lidocaine -prilocaine  (EMLA ) cream Apply 1 Application topically as needed. Apply to Port-A-Cath at least 1 hr prior to treatment. 30 g 0   Multiple Vitamins-Minerals (MULTIVITAMIN WITH MINERALS) tablet Take 1 tablet by mouth at bedtime.     ferrous sulfate  325 (65 FE) MG EC tablet TAKE 1 TABLET BY MOUTH EVERY DAY WITH BREAKFAST (Patient not taking: No sig reported) 90 tablet 1   Fluticasone Propionate , Inhal, (FLUTICASONE PROPIONATE  DISKUS) 250 MCG/ACT AEPB TAKE 1 PUFF BY MOUTH EVERY DAY (Patient not taking: No sig reported) 60 each 1   nicotine  (NICODERM CQ  - DOSED IN MG/24 HOURS) 14 mg/24hr patch PLACE 1 PATCH (14 MG TOTAL) ONTO THE SKIN DAILY. (Patient not taking: Reported on 04/17/2024) 28 patch 1   Vitamin D , Ergocalciferol , (DRISDOL ) 1.25 MG (50000 UNIT) CAPS capsule Take 1 capsule (50,000 Units total) by mouth once a week. (Patient not taking: Reported on 04/17/2024) 12 capsule 6   Facility-Administered Medications Prior to Visit  Medication Dose Route Frequency Provider Last Rate Last Admin   acetaminophen  (TYLENOL ) 325 MG tablet            famotidine  (PEPCID ) 20 MG tablet

## 2024-04-23 NOTE — Telephone Encounter (Signed)
Patient is aware of date and time.  

## 2024-04-23 NOTE — Telephone Encounter (Signed)
 Mr Andres Fernandez contacted office with following update - saw pulmonologist with Andres Fernandez group today. Scheduled for CT on 5/19 and Lung bronchoscopy and biopsy on 5/20. Dr. Salomon Cree informed. Dr. Salomon Cree asked that Andres Fernandez be scheduled for lab and follow up appt with him end of next week or beginning of week of 5/27. Contacted Andres Fernandez with Dr. Epifania Haskell message as above. Schedule message sent

## 2024-04-23 NOTE — Patient Instructions (Signed)
 We will be proceeding with bronchoscopy next week (04/29/2024) to biopsy the lung nodule.

## 2024-04-23 NOTE — H&P (View-Only) (Signed)
 Assessment & Plan:   #Right lower lobe lung mass (Primary)  Nodule Location: RLL Nodule Size: 3.2 x 2.9 cm Nodule Spiculation: Yes Associated Lymphadenopathy: yes Smoking Status (former) and pack years: 50 Extrathoracic cancer > 5 years prior (yes): Multiple Myeloma ECOG: 0  The patient is here to discuss their imaging abnormalities which include a large right lower lobe pulmonary nodule with spiculation and FDG avidity on PET scan.  This is associated with a satellite nodule that is similarly FDG avid as well as a right hilar FDG avid lymph node.  This is overall concerning for a primary lung malignancy, especially given the nodule was present on a CT scan from September 2023.  Other possibilities on the differential include multiple myeloma, separate tumor, and less likely an infectious process.  We discussed the importance of diagnosis and staging in lung malignancies, and the approach to obtaining a tissue diagnosis which would include robotic assisted navigational bronchoscopy with endobronchial ultrasound guided sampling.  We also discussed the risks associated with the procedure which include a 2% risk of pneumothorax, infection, bleeding, and nondiagnostic procedure in detail.  I explained that patients typically are able to return home the same day of the procedure, but in rare cases admission to the hospital for observation and treatment is required.  After our discussion, the patient elected to proceed with the procedure  - Procedural/ Surgical Case Request: ROBOTIC ASSISTED NAVIGATIONAL BRONCHOSCOPY, ENDOBRONCHIAL ULTRASOUND (EBUS); Future - CT SUPER D CHEST WO CONTRAST; Future  #Emphysema  Patient is noted to have significant emphysema on his chest CT with some bronchial thickening which could be attributed to his recent bout with influenza.  Given his significant smoking history and current use of albuterol , he is at high risk for COPD which I will workup with a pulmonary  function test.  We will give him a spacer device today and pending findings from the PFT will consider long-acting inhalers with LABA/LAMA +/- ICS.  -Continue Albuterol  with spacer Device -PFT (spirometry, lung volume, DLCO)   I spent 60 minutes caring for this patient today, including preparing to see the patient, obtaining a medical history , reviewing a separately obtained history, performing a medically appropriate examination and/or evaluation, counseling and educating the patient/family/caregiver, ordering medications, tests, or procedures, documenting clinical information in the electronic health record, and independently interpreting results (not separately reported/billed) and communicating results to the patient/family/caregiver  Andres Glasgow, MD [facility]  End of visit medications:  No orders of the defined types were placed in this encounter.    Current Outpatient Medications:    acyclovir  (ZOVIRAX ) 400 MG tablet, Take 1 tablet (400 mg total) by mouth daily., Disp: 90 tablet, Rfl: 6   albuterol  (VENTOLIN  HFA) 108 (90 Base) MCG/ACT inhaler, Inhale 2 puffs into the lungs every 6 (six) hours as needed for shortness of breath., Disp: , Rfl:    aspirin  EC 81 MG tablet, Take 1 tablet (81 mg total) by mouth daily., Disp: 60 tablet, Rfl: 11   [Paused] lenalidomide  (REVLIMID ) 10 MG capsule, Take 1 capsule (10 mg total) by mouth daily. Take 1 capsule (10 mg total) by mouth daily for 21 days. Take 7 days off. Repeat., Disp: 21 capsule, Rfl: 0   lidocaine -prilocaine  (EMLA ) cream, Apply 1 Application topically as needed. Apply to Port-A-Cath at least 1 hr prior to treatment., Disp: 30 g, Rfl: 0   Multiple Vitamins-Minerals (MULTIVITAMIN WITH MINERALS) tablet, Take 1 tablet by mouth at bedtime., Disp: , Rfl:  ferrous sulfate  325 (65 FE) MG EC tablet, TAKE 1 TABLET BY MOUTH EVERY DAY WITH BREAKFAST (Patient not taking: No sig reported), Disp: 90 tablet, Rfl: 1    Fluticasone Propionate , Inhal, (FLUTICASONE PROPIONATE  DISKUS) 250 MCG/ACT AEPB, TAKE 1 PUFF BY MOUTH EVERY DAY (Patient not taking: No sig reported), Disp: 60 each, Rfl: 1   nicotine  (NICODERM CQ  - DOSED IN MG/24 HOURS) 14 mg/24hr patch, PLACE 1 PATCH (14 MG TOTAL) ONTO THE SKIN DAILY. (Patient not taking: Reported on 04/17/2024), Disp: 28 patch, Rfl: 1   Vitamin D , Ergocalciferol , (DRISDOL ) 1.25 MG (50000 UNIT) CAPS capsule, Take 1 capsule (50,000 Units total) by mouth once a week. (Patient not taking: Reported on 04/17/2024), Disp: 12 capsule, Rfl: 6 No current facility-administered medications for this visit.  Facility-Administered Medications Ordered in Other Visits:    acetaminophen  (TYLENOL ) 325 MG tablet, , , ,    famotidine  (PEPCID ) 20 MG tablet, , , ,    Subjective:   PATIENT ID: Andres Fernandez GENDER: male DOB: March 15, 1956, MRN: [id number]  Chief Complaint  Patient presents with   New Patient (Initial Visit)    New: COPD, CT: 04/17/24, x-ray: 04/01/24, DOE, right lower lobe mass.  SOB/DOE for about 2-3 weeks. Some wheeze but no cough. Currently taking an inhaler but it doesn't seem to do much for him -- feels like he's taking shallow breathes.     HPI  The patient is a pleasant 68 year old male with a past medical history of multiple myeloma presenting to clinic for the evaluation of a pulmonary nodule.  Patient presented to the hospital on 04/17/2024 with shortness of breath and increased dyspnea with exertion following a recent diagnosis of influenza at urgent care.  He was dehydrated and was admitted to the hospital for initiation of IV fluids as well as Tamiflu . He is feeling better post discharge but continues to experience shortness of breath with exertion. He has an albuterol  inhaler that he uses occasionally, and doesn't feel resolution of symptoms with it as he did in the past.  Prior to this hospitalization, he had a CXR that showed a nodular opacity in the right lung base. He  then underwent a PET/CT on 04/11/2024 showing a hypermetabolic spiculated right lower lobe nodule with a satellite nodule and hypermetabolic right hilar lymph node all suspicious for malignancy. This was confirmed on his chest CT on 04/17/2024.  Patient is referred to pulmonary for further evaluation and consideration of biopsy of said nodule.  Patient has a history of multiple myeloma, followed closely by Dr. Salomon Cree. His multiple myeloma is in remission status post autologous HSCT.  He is currently on lenalidomide  and carfilzomib  for maintenance  Patient has a history of smoking, having started at the age of 69 and quit 1 year ago.  He has around 50 pack years of smoking history.  He also has history of crack cocaine use but has not been abstinent for 10 years.  Ancillary information including prior medications, full medical/surgical/family/social histories, and PFTs (when available) are listed below and have been reviewed.   Review of Systems  Constitutional:  Negative for chills, fever and weight loss.  HENT:  Negative for hearing loss.   Respiratory:  Positive for shortness of breath. Negative for cough, sputum production and wheezing.   Cardiovascular:  Negative for chest pain.     Objective:   Vitals:   04/23/24 1004  BP: 130/80  Pulse: (!) 104  Temp: 98.4 F (36.9 C)  TempSrc: Oral  SpO2:  95%  Weight: 143 lb 12.8 oz (65.2 kg)  Height: 5\' 8"  (1.727 m)   95% on RA BMI Readings from Last 3 Encounters:  04/23/24 21.86 kg/m  04/17/24 22.05 kg/m  04/11/24 23.49 kg/m   Wt Readings from Last 3 Encounters:  04/23/24 143 lb 12.8 oz (65.2 kg)  04/17/24 145 lb (65.8 kg)  04/11/24 154 lb 8 oz (70.1 kg)    Physical Exam Constitutional:      Appearance: Normal appearance.  Cardiovascular:     Rate and Rhythm: Normal rate and regular rhythm.     Pulses: Normal pulses.     Heart sounds: Normal heart sounds.  Pulmonary:     Effort: Pulmonary effort is normal.     Breath sounds:  Normal breath sounds. No wheezing.  Neurological:     General: No focal deficit present.     Mental Status: He is alert and oriented to person, place, and time. Mental status is at baseline.       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   Tobacco abuse      Family History  Problem Relation Age of Onset   COPD Mother    Liver cancer Mother      Past Surgical History:  Procedure Laterality Date   APPENDECTOMY     FEMUR IM NAIL Right 11/20/2017   Procedure: RIGHT FEMORAL INTRAMEDULLARY (IM) NAIL;  Surgeon: Janeth Medicus, MD;  Location: [facility] OR;  Service: Orthopedics;  Laterality: Right;   IR IMAGING GUIDED PORT INSERTION  08/19/2021    Social History   Socioeconomic History   Marital status: Single    Spouse name: Not on file   Number of children: Not on file   Years of education: Not on file   Highest education level: Not on file  Occupational History   Not on file  Tobacco Use   Smoking status: Some Days    Current packs/day: 0.25    Average packs/day: 0.3 packs/day    Types: Cigarettes    Start date: 04/16/2024    Last attempt to quit: 11/09/2017   Smokeless tobacco: Never   Tobacco comments:    Smokes a cigarette "every once in a while" starting about 2 weeks ago - 04/23/24  Vaping Use   Vaping status: Never Used  Substance and Sexual Activity   Alcohol use: Not Currently   Drug use: Not Currently   Sexual activity: Not on file  Other Topics Concern   Not on file  Social History Narrative   Not on file   Social Drivers of Health   Financial Resource Strain: Not on file  Food Insecurity: No Food Insecurity (04/17/2024)   Hunger Vital Sign    Worried About Running Out of Food in the Last Year: Never true    Ran Out of Food in the Last Year: Never true  Transportation Needs: No  Transportation Needs (04/17/2024)   PRAPARE - Administrator, Civil Service (Medical): No    Lack of Transportation (Non-Medical): No  Physical Activity: Not on file  Stress: Not on file  Social Connections: Moderately Integrated (04/17/2024)   Social Connection and Isolation Panel [NHANES]    Frequency of Communication with Friends and Family: More than three times a week    Frequency of Social Gatherings  with Friends and Family: Three times a week    Attends Religious Services: More than 4 times per year    Active Member of Clubs or Organizations: Yes    Attends Banker Meetings: More than 4 times per year    Marital Status: Divorced  Intimate Partner Violence: Not At Risk (04/17/2024)   Humiliation, Afraid, Rape, and Kick questionnaire    Fear of Current or Ex-Partner: No    Emotionally Abused: No    Physically Abused: No    Sexually Abused: No     No Known Allergies   CBC    Component Value Date/Time   WBC 4.1 04/17/2024 1218   RBC 4.64 04/17/2024 1218   HGB 13.6 04/17/2024 1218   HGB 13.1 04/11/2024 0852   HGB 8.3 (L) 12/14/2017 1230   HCT 41.8 04/17/2024 1218   HCT 23.9 (L) 12/14/2017 1230   PLT 136 (L) 04/17/2024 1218   PLT 377 04/11/2024 0852   PLT 524 (H) 12/14/2017 1230   MCV 90.1 04/17/2024 1218   MCV 88.4 12/14/2017 1230   MCH 29.3 04/17/2024 1218   MCHC 32.5 04/17/2024 1218   RDW 15.1 04/17/2024 1218   RDW 15.3 (H) 12/14/2017 1230   LYMPHSABS 1.4 04/17/2024 1218   LYMPHSABS 1.4 12/14/2017 1230   MONOABS 0.9 04/17/2024 1218   MONOABS 0.6 12/14/2017 1230   EOSABS 0.0 04/17/2024 1218   EOSABS 0.1 12/14/2017 1230   BASOSABS 0.0 04/17/2024 1218   BASOSABS 0.1 12/14/2017 1230    Pulmonary Functions Testing Results:     No data to display          Outpatient Medications Prior to Visit  Medication Sig Dispense Refill   acyclovir  (ZOVIRAX ) 400 MG tablet Take 1 tablet (400 mg total) by mouth daily. 90 tablet 6   albuterol  (VENTOLIN   HFA) 108 (90 Base) MCG/ACT inhaler Inhale 2 puffs into the lungs every 6 (six) hours as needed for shortness of breath.     aspirin  EC 81 MG tablet Take 1 tablet (81 mg total) by mouth daily. 60 tablet 11   lenalidomide  (REVLIMID ) 10 MG capsule Take 1 capsule (10 mg total) by mouth daily. Take 1 capsule (10 mg total) by mouth daily for 21 days. Take 7 days off. Repeat. 21 capsule 0   lidocaine -prilocaine  (EMLA ) cream Apply 1 Application topically as needed. Apply to Port-A-Cath at least 1 hr prior to treatment. 30 g 0   Multiple Vitamins-Minerals (MULTIVITAMIN WITH MINERALS) tablet Take 1 tablet by mouth at bedtime.     ferrous sulfate  325 (65 FE) MG EC tablet TAKE 1 TABLET BY MOUTH EVERY DAY WITH BREAKFAST (Patient not taking: No sig reported) 90 tablet 1   Fluticasone Propionate , Inhal, (FLUTICASONE PROPIONATE  DISKUS) 250 MCG/ACT AEPB TAKE 1 PUFF BY MOUTH EVERY DAY (Patient not taking: No sig reported) 60 each 1   nicotine  (NICODERM CQ  - DOSED IN MG/24 HOURS) 14 mg/24hr patch PLACE 1 PATCH (14 MG TOTAL) ONTO THE SKIN DAILY. (Patient not taking: Reported on 04/17/2024) 28 patch 1   Vitamin D , Ergocalciferol , (DRISDOL ) 1.25 MG (50000 UNIT) CAPS capsule Take 1 capsule (50,000 Units total) by mouth once a week. (Patient not taking: Reported on 04/17/2024) 12 capsule 6   Facility-Administered Medications Prior to Visit  Medication Dose Route Frequency Provider Last Rate Last Admin   acetaminophen  (TYLENOL ) 325 MG tablet            famotidine  (PEPCID ) 20 MG tablet

## 2024-04-24 ENCOUNTER — Telehealth: Payer: Self-pay

## 2024-04-24 NOTE — Telephone Encounter (Signed)
 Noted. Nothing further needed.

## 2024-04-24 NOTE — Telephone Encounter (Signed)
 For the codes 62952, [id number], [phone] Prior Auth Not Required Refer # [id number] 04/23/24

## 2024-04-24 NOTE — Telephone Encounter (Signed)
 T/C from pt asking when he would be restarting his Revlimid .  Pt advised, Per Wyline Hearing, PA, pt is to continue to hold until his next appt on 5/30 and it will be reviewed at that time

## 2024-04-25 ENCOUNTER — Other Ambulatory Visit

## 2024-04-25 ENCOUNTER — Encounter
Admission: RE | Admit: 2024-04-25 | Discharge: 2024-04-25 | Disposition: A | Discharge status: Home / Self Care | Source: Ambulatory Visit | Attending: Student in an Organized Health Care Education/Training Program | Admitting: Student in an Organized Health Care Education/Training Program

## 2024-04-25 ENCOUNTER — Ambulatory Visit: Payer: Medicare Other | Admitting: Hematology

## 2024-04-25 ENCOUNTER — Ambulatory Visit: Payer: Medicare Other

## 2024-04-25 ENCOUNTER — Other Ambulatory Visit: Payer: Self-pay

## 2024-04-25 ENCOUNTER — Other Ambulatory Visit: Payer: Medicare Other

## 2024-04-25 HISTORY — DX: Systemic inflammatory response syndrome (sirs) of non-infectious origin without acute organ dysfunction: R65.10

## 2024-04-25 HISTORY — DX: Chronic obstructive pulmonary disease, unspecified: J44.9

## 2024-04-25 HISTORY — DX: Multiple myeloma not having achieved remission: C90.00

## 2024-04-25 HISTORY — DX: Acute kidney failure, unspecified: N17.9

## 2024-04-25 HISTORY — DX: Thrombocytopenia, unspecified: D69.6

## 2024-04-25 NOTE — Patient Instructions (Addendum)
 Your procedure is scheduled on: Tuesday 04/29/24 To find out your arrival time, please call [phone] between 1PM - 3PM on:   Monday 04/28/24 Report to the Registration Desk on the 1st floor of the [facility]. Free Valet parking is available.  If your arrival time is 6:00 am, do not arrive before that time as the [facility] entrance doors do not open until 6:00 am.  REMEMBER: Instructions that are not followed completely may result in serious medical risk, up to and including death; or upon the discretion of your surgeon and anesthesiologist your surgery may need to be rescheduled.  Do not eat food or drink any liquids after midnight the night before surgery.  No gum chewing or hard candies.  One week prior to surgery: Stop Anti-inflammatories (NSAIDS) such as Advil , Aleve , Ibuprofen , Motrin , Naproxen , Naprosyn  and Aspirin  based products such as Excedrin, Goody's Powder, BC Powder. You may however, continue to take Tylenol  if needed for pain up until the day of surgery.  Stop ALL OVER THE COUNTER supplements and vitamins until after surgery.  Continue taking all prescribed medications with the exception of the following: Hold your Revlimid  and Aspirin  as instructed.   TAKE ONLY THESE MEDICATIONS THE MORNING OF SURGERY WITH A SIP OF WATER:  acyclovir  (ZOVIRAX ) 400 MG tablet   Use inhalers on the day of surgery and bring to the hospital.  No Alcohol for 24 hours before or after surgery.  No Smoking including e-cigarettes for 24 hours before surgery.  No chewable tobacco products for at least 6 hours before surgery.  No nicotine  patches on the day of surgery.  Do not use any "recreational" drugs for at least a week (preferably 2 weeks) before your surgery.  Please be advised that the combination of cocaine and anesthesia may have negative outcomes, up to and including death. If you test positive for cocaine, your surgery will be cancelled.  On the morning of surgery brush  your teeth with toothpaste and water, you may rinse your mouth with mouthwash if you wish. Do not swallow any toothpaste or mouthwash.  Shower as usual the morning of your procedure  Do not wear lotions, powders, or perfumes/cologne.   Do not shave body hair from the neck down 48 hours before surgery.  Wear comfortable clothing (specific to your surgery type) to the hospital.  Do not wear jewelry, make-up, hairpins, clips or nail polish.  For welded (permanent) jewelry: bracelets, anklets, waist bands, etc.  Please have this removed prior to surgery.  If it is not removed, there is a chance that hospital personnel will need to cut it off on the day of surgery. Contact lenses, hearing aids and dentures may not be worn into surgery.  Do not bring valuables to the hospital. [facility] is not responsible for any missing/lost belongings or valuables.   Notify your doctor if there is any change in your medical condition (cold, fever, infection).  If you are being discharged the day of surgery, you will not be allowed to drive home. You will need a responsible individual to drive you home and stay with you for 24 hours after surgery.   If you are taking public transportation, you will need to have a responsible individual with you.  After surgery, you can help prevent lung complications by doing breathing exercises.  Take deep breaths and cough every 1-2 hours. Your doctor may order a device called an Incentive Spirometer to help you take deep breaths.  Surgery Visitation  Policy:  Patients undergoing a surgery or procedure may have two family members or support persons with them as long as the person is not COVID-19 positive or experiencing its symptoms.   Please call the Pre-admissions Testing Dept. at [phone] if you have any questions about these instructions.

## 2024-04-28 ENCOUNTER — Ambulatory Visit (HOSPITAL_COMMUNITY)

## 2024-04-29 ENCOUNTER — Ambulatory Visit
Admission: RE | Admit: 2024-04-29 | Discharge: 2024-04-29 | Disposition: A | Discharge status: Home / Self Care | Attending: Student in an Organized Health Care Education/Training Program | Admitting: Student in an Organized Health Care Education/Training Program

## 2024-04-29 ENCOUNTER — Other Ambulatory Visit: Payer: Self-pay

## 2024-04-29 ENCOUNTER — Ambulatory Visit

## 2024-04-29 ENCOUNTER — Encounter: Payer: Self-pay | Admitting: Student in an Organized Health Care Education/Training Program

## 2024-04-29 ENCOUNTER — Ambulatory Visit: Admitting: Anesthesiology

## 2024-04-29 ENCOUNTER — Ambulatory Visit: Admitting: Hematology

## 2024-04-29 ENCOUNTER — Ambulatory Visit
Admission: RE | Admit: 2024-04-29 | Discharge: 2024-04-29 | Disposition: A | Discharge status: Home / Self Care | Source: Ambulatory Visit | Attending: Student in an Organized Health Care Education/Training Program | Admitting: Student in an Organized Health Care Education/Training Program

## 2024-04-29 ENCOUNTER — Encounter
Admission: RE | Disposition: A | Discharge status: Home / Self Care | Payer: Self-pay | Source: Home / Self Care | Attending: Student in an Organized Health Care Education/Training Program

## 2024-04-29 DIAGNOSIS — J439 Emphysema, unspecified: Secondary | ICD-10-CM | POA: Insufficient documentation

## 2024-04-29 DIAGNOSIS — I10 Essential (primary) hypertension: Secondary | ICD-10-CM | POA: Insufficient documentation

## 2024-04-29 DIAGNOSIS — C9000 Multiple myeloma not having achieved remission: Secondary | ICD-10-CM | POA: Insufficient documentation

## 2024-04-29 DIAGNOSIS — C3431 Malignant neoplasm of lower lobe, right bronchus or lung: Secondary | ICD-10-CM | POA: Insufficient documentation

## 2024-04-29 DIAGNOSIS — R918 Other nonspecific abnormal finding of lung field: Secondary | ICD-10-CM | POA: Insufficient documentation

## 2024-04-29 DIAGNOSIS — Z79899 Other long term (current) drug therapy: Secondary | ICD-10-CM | POA: Insufficient documentation

## 2024-04-29 DIAGNOSIS — R911 Solitary pulmonary nodule: Secondary | ICD-10-CM | POA: Diagnosis not present

## 2024-04-29 HISTORY — PX: ENDOBRONCHIAL ULTRASOUND: SHX5096

## 2024-04-29 HISTORY — PX: BRONCHOSCOPY, WITH BIOPSY USING ELECTROMAGNETIC NAVIGATION: SHX7536

## 2024-04-29 SURGERY — BRONCHOSCOPY, WITH BIOPSY USING ELECTROMAGNETIC NAVIGATION
Anesthesia: General | Laterality: Bilateral

## 2024-04-29 MED ORDER — DEXMEDETOMIDINE HCL IN NACL 80 MCG/20ML IV SOLN
INTRAVENOUS | Status: DC | PRN
  Administered 2024-04-29: 8 ug via INTRAVENOUS

## 2024-04-29 MED ORDER — LIDOCAINE HCL (CARDIAC) PF 100 MG/5ML IV SOSY
PREFILLED_SYRINGE | INTRAVENOUS | Status: DC | PRN
  Administered 2024-04-29: 60 mg via INTRAVENOUS

## 2024-04-29 MED ORDER — CHLORHEXIDINE GLUCONATE 0.12 % MT SOLN
15.0000 mL | Freq: Once | OROMUCOSAL | Status: AC
  Administered 2024-04-29: 15 mL via OROMUCOSAL

## 2024-04-29 MED ORDER — OXYCODONE HCL 5 MG PO TABS: 5.0000 mg | ORAL_TABLET | Freq: Once | ORAL | Status: DC | PRN

## 2024-04-29 MED ORDER — PROPOFOL 10 MG/ML IV BOLUS
INTRAVENOUS | Status: DC | PRN
  Administered 2024-04-29: 120 mg via INTRAVENOUS

## 2024-04-29 MED ORDER — DEXAMETHASONE SODIUM PHOSPHATE 10 MG/ML IJ SOLN
INTRAMUSCULAR | Status: DC | PRN
  Administered 2024-04-29: 10 mg via INTRAVENOUS

## 2024-04-29 MED ORDER — CHLORHEXIDINE GLUCONATE 0.12 % MT SOLN
OROMUCOSAL | Status: AC
  Filled 2024-04-29: qty 15

## 2024-04-29 MED ORDER — PROPOFOL 1000 MG/100ML IV EMUL
INTRAVENOUS | Status: AC
  Filled 2024-04-29: qty 100

## 2024-04-29 MED ORDER — OXYCODONE HCL 5 MG/5ML PO SOLN: 5.0000 mg | Freq: Once | ORAL | Status: DC | PRN

## 2024-04-29 MED ORDER — DEXAMETHASONE SODIUM PHOSPHATE 10 MG/ML IJ SOLN
INTRAMUSCULAR | Status: AC
  Filled 2024-04-29: qty 1

## 2024-04-29 MED ORDER — PHENYLEPHRINE 80 MCG/ML (10ML) SYRINGE FOR IV PUSH (FOR BLOOD PRESSURE SUPPORT)
PREFILLED_SYRINGE | INTRAVENOUS | Status: DC | PRN
  Administered 2024-04-29: 80 ug via INTRAVENOUS
  Administered 2024-04-29: 160 ug via INTRAVENOUS
  Administered 2024-04-29 (×5): 80 ug via INTRAVENOUS

## 2024-04-29 MED ORDER — FENTANYL CITRATE (PF) 100 MCG/2ML IJ SOLN: 25.0000 ug | INTRAMUSCULAR | Status: DC | PRN

## 2024-04-29 MED ORDER — SUGAMMADEX SODIUM 200 MG/2ML IV SOLN
INTRAVENOUS | Status: DC | PRN
  Administered 2024-04-29: 200 mg via INTRAVENOUS

## 2024-04-29 MED ORDER — ROCURONIUM BROMIDE 100 MG/10ML IV SOLN
INTRAVENOUS | Status: DC | PRN
Start: 2024-04-29 — End: 2024-04-29
  Administered 2024-04-29: 50 mg via INTRAVENOUS
  Administered 2024-04-29: 10 mg via INTRAVENOUS

## 2024-04-29 MED ORDER — ROCURONIUM BROMIDE 10 MG/ML (PF) SYRINGE
PREFILLED_SYRINGE | INTRAVENOUS | Status: AC
  Filled 2024-04-29: qty 10

## 2024-04-29 MED ORDER — PROPOFOL 500 MG/50ML IV EMUL
INTRAVENOUS | Status: DC | PRN
  Administered 2024-04-29: 125 ug/kg/min via INTRAVENOUS

## 2024-04-29 MED ORDER — FENTANYL CITRATE (PF) 100 MCG/2ML IJ SOLN
INTRAMUSCULAR | Status: AC
Start: 2024-04-29 — End: ?
  Filled 2024-04-29: qty 2

## 2024-04-29 MED ORDER — LACTATED RINGERS IV SOLN: INTRAVENOUS | Status: DC

## 2024-04-29 MED ORDER — LIDOCAINE HCL (PF) 2 % IJ SOLN
INTRAMUSCULAR | Status: AC
  Filled 2024-04-29: qty 5

## 2024-04-29 MED ORDER — ONDANSETRON HCL 4 MG/2ML IJ SOLN
INTRAMUSCULAR | Status: DC | PRN
  Administered 2024-04-29: 4 mg via INTRAVENOUS

## 2024-04-29 MED ORDER — ORAL CARE MOUTH RINSE: 15.0000 mL | Freq: Once | OROMUCOSAL | Status: AC

## 2024-04-29 MED ORDER — ACETAMINOPHEN 10 MG/ML IV SOLN: 1000.0000 mg | Freq: Once | INTRAVENOUS | Status: DC | PRN

## 2024-04-29 MED ORDER — DROPERIDOL 2.5 MG/ML IJ SOLN: 0.6250 mg | Freq: Once | INTRAMUSCULAR | Status: DC | PRN

## 2024-04-29 MED ORDER — FENTANYL CITRATE (PF) 100 MCG/2ML IJ SOLN
INTRAMUSCULAR | Status: DC | PRN
  Administered 2024-04-29 (×2): 50 ug via INTRAVENOUS

## 2024-04-29 MED ORDER — ONDANSETRON HCL 4 MG/2ML IJ SOLN
INTRAMUSCULAR | Status: AC
  Filled 2024-04-29: qty 2

## 2024-04-29 NOTE — Interval H&P Note (Signed)
 S: Feeling well, no shortness of breath, no chest pain, no wheeze.  O: Vitals:   04/29/24 0829  BP: (!) 119/90  Pulse: 76  Resp: 20  Temp: (!) 97.3 F (36.3 C)  SpO2: 98%   General: Well-appearing and in no distress. Well-nourished Eyes: Anicteric, no conjunctival pallor HEENT: Mucous membranes moist, no evidence of postnasal drip Respiratory: Trachea is midline, no respiratory distress, good bilateral air entry, no wheezes, rales, or rhonchi Cardiovascular: Heart with regular rate and rhythm, normal S1 and S2, no murmurs, rubs, or gallops Skin: No rashes on limited exam Neuro: Alert and oriented, no gross focal deficits  A/P: Pleasant 68 year old male with an FDG avid RLL nodule presenting for biopsy. Risks and benefits of robotic assisted navigational bronchoscopy with EBUS/TBNA discussed, and patient understands risks and agrees to proceed. He is appropriate for the procedure.  [name], MD [facility] 04/29/2024 10:10 AM

## 2024-04-29 NOTE — Anesthesia Preprocedure Evaluation (Signed)
 Anesthesia Evaluation  Patient identified by MRN, date of birth, ID band Patient awake    Reviewed: Allergy & Precautions, H&P , NPO status , Patient's Chart, lab work & pertinent test results, reviewed documented beta blocker date and time   Airway Mallampati: II  TM Distance: >3 FB Neck ROM: full    Dental  (+) Teeth Intact, Poor Dentition   Pulmonary neg shortness of breath, pneumonia, resolved, COPD, Current Smoker and Patient abstained from smoking.   Pulmonary exam normal        Cardiovascular Exercise Tolerance: Poor hypertension, On Medications negative cardio ROS Normal cardiovascular exam Rhythm:regular Rate:Normal     Neuro/Psych negative neurological ROS  negative psych ROS   GI/Hepatic negative GI ROS,,,(+) Hepatitis -  Endo/Other  negative endocrine ROS    Renal/GU Renal disease  negative genitourinary   Musculoskeletal   Abdominal   Peds  Hematology  (+) Blood dyscrasia, anemia   Anesthesia Other Findings Past Medical History: No date: AKI (acute kidney injury) (HCC) No date: ARF (acute renal failure) (HCC) No date: BPH (benign prostatic hyperplasia) No date: Cancer (HCC) No date: COPD (chronic obstructive pulmonary disease) (HCC) 11/27/2017: Hepatitis C No date: Hypertension No date: Multiple myeloma (HCC) 11/23/2017: Plasma cell leukemia (HCC) No date: SIRS (systemic inflammatory response syndrome) (HCC) No date: Thrombocytopenia (HCC) No date: Tobacco abuse Past Surgical History: No date: APPENDECTOMY 11/20/2017: FEMUR IM NAIL; Right     Comment:  Procedure: RIGHT FEMORAL INTRAMEDULLARY (IM) NAIL;                Surgeon: Janeth Medicus, MD;  Location: [facility] OR;                Service: Orthopedics;  Laterality: Right; 08/19/2021: IR IMAGING GUIDED PORT INSERTION No date: PENILE PROSTHESIS IMPLANT   Reproductive/Obstetrics negative OB ROS                              Anesthesia Physical Anesthesia Plan  ASA: 3  Anesthesia Plan: General ETT   Post-op Pain Management:    Induction:   PONV Risk Score and Plan: 2  Airway Management Planned:   Additional Equipment:   Intra-op Plan:   Post-operative Plan:   Informed Consent: I have reviewed the patients History and Physical, chart, labs and discussed the procedure including the risks, benefits and alternatives for the proposed anesthesia with the patient or authorized representative who has indicated his/her understanding and acceptance.     Dental Advisory Given  Plan Discussed with: CRNA  Anesthesia Plan Comments:        Anesthesia Quick Evaluation

## 2024-04-29 NOTE — Op Note (Addendum)
 Video Bronchoscopy with Robotic Assisted Bronchoscopic Navigation   Date of Operation: 04/29/2024   Pre-op Diagnosis: lung nodule  Surgeon: Darnelle Elders  Anesthesia: General endotracheal anesthesia  Operation: Flexible video fiberoptic bronchoscopy with robotic assistance and biopsies.  Estimated Blood Loss: Minimal  Complications: None  Indications and History: Andres Fernandez is a 68 y.o. male with history of multiple myeloma presenting with a RLL mass and lymphadenopathy.  Recommendation made to achieve a tissue diagnosis via robotic assisted navigational bronchoscopy.  The risks, benefits, complications, treatment options and expected outcomes were discussed with the patient.  The possibilities of pneumothorax, pneumonia, reaction to medication, pulmonary aspiration, perforation of a viscus, bleeding, failure to diagnose a condition and creating a complication requiring transfusion or operation were discussed with the patient who freely signed the consent.    Description of Procedure: The patient was seen in the Preoperative Area, was examined and was deemed appropriate to proceed.  The patient was taken to [facility] Endoscopy room 3, identified as Andres Fernandez and the procedure verified as Flexible Video Fiberoptic Bronchoscopy.  A Time Out was held and the above information confirmed.   Prior to the date of the procedure a high-resolution CT scan of the chest was performed. Utilizing ION software program a virtual tracheobronchial tree was generated to allow the creation of distinct navigation pathways to the patient's parenchymal abnormalities. After being taken to the operating room general anesthesia was initiated and the patient  was orally intubated. The video fiberoptic bronchoscope was introduced via the endotracheal tube and a general inspection was performed which showed normal right and left lung anatomy. Aspiration of the bilateral mainstems was completed to remove any remaining  secretions. Robotic catheter inserted into patient's endotracheal tube.   Target #1 RLL mass: The distinct navigation pathways prepared prior to this procedure were then utilized to navigate to patient's lesion identified on CT scan. The robotic catheter was secured into place and the vision probe was withdrawn.  Lesion location was approximated using fluoroscopy.  Local registration and targeting was performed using GE OEC 3D mobile C-arm three-dimensional imaging. Radial EBUS was used to further confirm the location of the lesion and correct for CT to body divergence and to guide dept of the biopsy; a concentric view was obtained. Under fluoroscopic guidance transbronchial brushings, transbronchial needle biopsies, and transbronchial forceps biopsies were performed to be sent for cytology and pathology.  Needle-in-lesion was confirmed using mobile C-arm.  A bronchioalveolar lavage was performed in the RLL and sent for microbiology.  Target #2 EBUS:  We then introduced the EBUS bronchoscope and the hilar and mediastinal lymph node stations were evaluated. Stations 11L and 7 were noted to be enlarged and biopsied with a 21G Olympus ViziShot 2 needle. Samples were sent for slide and cytology.  At the end of the procedure a general airway inspection was performed and there was no evidence of active bleeding. The bronchoscope was removed.  The patient tolerated the procedure well. There was no significant blood loss and there were no obvious complications. A post-procedural chest x-ray is pending.  Samples Target #1: RLL 1. Transbronchial brushings from RLL nodule 2. Transbronchial needle biopsies from RLL nodule 3. Transbronchial forceps biopsies from RLL nodule 4. Bronchoalveolar lavage from RLL    Samples Target #2: EBUS to 11L and 7  Plans:  The patient will be discharged from the PACU to home when recovered from anesthesia and after chest x-ray is reviewed. We will review the cytology,  pathology and microbiology  results with the patient when they become available. Outpatient followup will be with myself and Dr. Salomon Cree.  Andres Glasgow, MD [facility] 04/29/2024 11:46 AM

## 2024-04-29 NOTE — Anesthesia Procedure Notes (Signed)
 Procedure Name: Intubation Date/Time: 04/29/2024 10:34 AM  Performed by: Alphonse Asal, RNPre-anesthesia Checklist: Patient identified, Emergency Drugs available, Suction available and Patient being monitored Patient Re-evaluated:Patient Re-evaluated prior to induction Oxygen Delivery Method: Circle System Utilized Preoxygenation: Pre-oxygenation with 100% oxygen Induction Type: IV induction Ventilation: Mask ventilation without difficulty Laryngoscope Size: McGrath and 4 Grade View: Grade I Tube type: Oral Tube size: 8.5 mm Number of attempts: 1 Airway Equipment and Method: Stylet and Oral airway Placement Confirmation: ETT inserted through vocal cords under direct vision, positive ETCO2 and breath sounds checked- equal and bilateral Secured at: 23 cm Tube secured with: Tape Dental Injury: Teeth and Oropharynx as per pre-operative assessment

## 2024-04-29 NOTE — Transfer of Care (Signed)
 Immediate Anesthesia Transfer of Care Note  Patient: Andres Fernandez  Procedure(s) Performed: ROBOTIC ASSISTED NAVIGATIONAL BRONCHOSCOPY (Bilateral) ENDOBRONCHIAL ULTRASOUND (EBUS) (Bilateral)  Patient Location: PACU  Anesthesia Type:General  Level of Consciousness: drowsy and patient cooperative  Airway & Oxygen Therapy: Patient Spontanous Breathing and Patient connected to face mask oxygen  Post-op Assessment: Report given to RN, Post -op Vital signs reviewed and stable, and Patient moving all extremities  Post vital signs: Reviewed and stable  Last Vitals:  Vitals Value Taken Time  BP 107/82 04/29/24 1151  Temp 36.1 C 04/29/24 1150  Pulse 81 04/29/24 1155  Resp 28 04/29/24 1155  SpO2 100 % 04/29/24 1155  Vitals shown include unfiled device data.  Last Pain:  Vitals:   04/29/24 0829  TempSrc: Temporal  PainSc: 0-No pain         Complications: No notable events documented.

## 2024-04-29 NOTE — OR PostOp (Incomplete)
 PACU TO INPATIENT HANDOFF REPORT  Name/Age/Gender Andres Fernandez 68 y.o. male  Code Status Code Status History     Date Active Date Inactive Code Status Order ID Comments User Context   04/17/2024 1643 04/18/2024 2203 Full Code [id number]  Verlyn Goad, MD Inpatient   08/07/2020 1514 08/09/2020 1548 Full Code [id number]  Oral Billings, MD ED   11/23/2017 1932 12/06/2017 1834 Full Code [id number]  Maylene Spear, MD Inpatient   11/17/2017 0821 11/22/2017 1810 Full Code [id number]  Lovell Rubenstein, NP ED    Questions for Most Recent Historical Code Status (Order [id number])     Question Answer   By: Consent: discussion documented in EHR            Home/SNF/Other {Discharge Destination:18313::"Home"}  Chief Complaint Right lower lobe lung mass [R91.8]  Level of Care/Admitting Diagnosis ED Disposition     None       Medical History Past Medical History:  Diagnosis Date   AKI (acute kidney injury) (HCC)    ARF (acute renal failure) (HCC)    BPH (benign prostatic hyperplasia)    Cancer (HCC)    COPD (chronic obstructive pulmonary disease) (HCC)    Hepatitis C 11/27/2017   Hypertension    Multiple myeloma (HCC)    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 However, due to the size of the patient record, not all encounters were searched. Please check Results Review for a complete set of results. DG C-Arm 1-60 Min-No Report Result Date: 04/29/2024 Fluoroscopy was utilized by the requesting  physician.  No radiographic interpretation.    Pending Labs   Vitals/Pain Today's Vitals   04/29/24 1200 04/29/24 1215 04/29/24 1230 04/29/24 1245  BP: 106/69 102/72 (!) 89/60 106/77  Pulse: 81 67 64 73  Resp: (!) 22 (!) 32 (!) 22 (!) 29  Temp:    (!) 97.1 F (36.2 C)  TempSrc:      SpO2: 97% 98% 98% 98%  PainSc: 0-No pain 0-No pain 0-No pain 0-No pain    Isolation Precautions @ISOLATION @  Administered Medications Periop Administered Meds from 04/29/2024 0805 to 04/29/2024 1246       Date/Time Order Dose Route Action Action by Comments    04/29/2024 0830 EDT chlorhexidine  (PERIDEX ) 0.12 % solution 15 mL 15 mL Mouth/Throat Given Lamarr Pilling, RN --    04/29/2024 1035 EDT dexamethasone  (DECADRON ) injection 10 mg Intravenous Given Noelia Batman, CRNA --    04/29/2024 1044 EDT dexmedetomidine (PRECEDEX) 80 MCG/20ML 8 mcg Intravenous Given Noelia Batman, CRNA --    04/29/2024 1031 EDT fentaNYL  (SUBLIMAZE ) injection 50 mcg Intravenous Given Noelia Batman, CRNA --    04/29/2024 1021 EDT fentaNYL  (SUBLIMAZE ) injection 50 mcg Intravenous Given Noelia Batman, CRNA --    04/29/2024 1141 EDT lactated ringers  infusion -- Intravenous Restarted Noelia Batman, CRNA --    04/29/2024 1140 EDT lactated ringers  infusion -- Intravenous Paused Alvira Josephs  M, CRNA Switch to gravity    04/29/2024 1021 EDT lactated ringers  infusion -- Intravenous Continued from Pre-op Noelia Batman, CRNA --    04/29/2024 [phone] EDT lactated ringers  infusion -- Intravenous New Bag/Given Lamarr Pilling, RN --    04/29/2024 1031 EDT lidocaine  (cardiac) 100 mg/67mL (XYLOCAINE ) injection 2% 60 mg Intravenous Given Noelia Batman, CRNA --    04/29/2024 1035 EDT ondansetron  (ZOFRAN ) injection 4 mg Intravenous Given Noelia Batman, CRNA --    04/29/2024 0830 EDT Oral care mouth rinse -- Mouth Rinse See Alternative Lamarr Pilling, RN --    04/29/2024 1128 EDT PHENYLephrine  80 mcg/ml in normal saline Adult IV  Push Syringe (For Blood Pressure Support) 80 mcg Intravenous Given Noelia Batman, CRNA --    04/29/2024 1121 EDT PHENYLephrine  80 mcg/ml in normal saline Adult IV Push Syringe (For Blood Pressure Support) 80 mcg Intravenous Given Noelia Batman, CRNA --    04/29/2024 1116 EDT PHENYLephrine  80 mcg/ml in normal saline Adult IV Push Syringe (For Blood Pressure Support) 80 mcg Intravenous Given Noelia Batman, CRNA --    04/29/2024 1110 EDT PHENYLephrine  80 mcg/ml in normal saline Adult IV Push Syringe (For Blood Pressure Support) 80 mcg Intravenous Given Noelia Batman, CRNA --    04/29/2024 1104 EDT PHENYLephrine  80 mcg/ml in normal saline Adult IV Push Syringe (For Blood Pressure Support) 160 mcg Intravenous Given Noelia Batman, CRNA --    04/29/2024 1058 EDT PHENYLephrine  80 mcg/ml in normal saline Adult IV Push Syringe (For Blood Pressure Support) 80 mcg Intravenous Given Noelia Batman, CRNA --    04/29/2024 1055 EDT PHENYLephrine  80 mcg/ml in normal saline Adult IV Push Syringe (For Blood Pressure Support) 80 mcg Intravenous Given Noelia Batman, CRNA --    04/29/2024 1031 EDT propofol  (DIPRIVAN ) 10 mg/mL bolus/IV push 120 mg Intravenous Given Noelia Batman, CRNA --    04/29/2024 1135 EDT propofol  (DIPRIVAN ) 500 MG/50ML infusion 0 mcg/kg/min Intravenous Stopped Noelia Batman, CRNA --    04/29/2024 1110 EDT propofol  (DIPRIVAN ) 500 MG/50ML infusion 100 mcg/kg/min Intravenous Rate/Dose Change Noelia Batman, CRNA --    04/29/2024 1106 EDT propofol  (DIPRIVAN ) 500 MG/50ML infusion 115 mcg/kg/min Intravenous Rate/Dose Change Noelia Batman, CRNA --    04/29/2024 1102 EDT propofol  (DIPRIVAN ) 500 MG/50ML infusion 125 mcg/kg/min Intravenous Rate/Dose Change Noelia Batman, CRNA --    04/29/2024 1049 EDT propofol  (DIPRIVAN ) 500 MG/50ML infusion 135 mcg/kg/min Intravenous Rate/Dose Change Noelia Batman, CRNA --    04/29/2024 1046 EDT propofol  (DIPRIVAN ) 500 MG/50ML infusion 125  mcg/kg/min Intravenous Rate/Dose Change Noelia Batman, CRNA --    04/29/2024 1039 EDT propofol  (DIPRIVAN ) 500 MG/50ML infusion 135 mcg/kg/min Intravenous Rate/Dose Change Noelia Batman, CRNA --    04/29/2024 1034 EDT propofol  (DIPRIVAN ) 500 MG/50ML infusion 125 mcg/kg/min Intravenous New Bag/Given Noelia Batman, CRNA --    04/29/2024 1101 EDT rocuronium  (ZEMURON ) injection 10 mg Intravenous Given Noelia Batman, CRNA --    04/29/2024 1032 EDT rocuronium  (ZEMURON ) injection 50 mg Intravenous Given Noelia Batman, CRNA --    04/29/2024 1137 EDT sugammadex  sodium (BRIDION ) injection 200 mg Intravenous Given Noelia Batman, CRNA --       Mobility {Mobility:20148}

## 2024-04-30 ENCOUNTER — Encounter: Payer: Self-pay | Admitting: Student in an Organized Health Care Education/Training Program

## 2024-04-30 ENCOUNTER — Ambulatory Visit: Payer: Self-pay

## 2024-04-30 ENCOUNTER — Ambulatory Visit (HOSPITAL_BASED_OUTPATIENT_CLINIC_OR_DEPARTMENT_OTHER): Payer: Self-pay | Admitting: Family Medicine

## 2024-04-30 NOTE — Telephone Encounter (Signed)
 Copied from CRM [id number]. Topic: Clinical - Red Word Triage >> Apr 30, 2024 10:09 AM Crist Dominion wrote: Red Word that prompted transfer to Nurse Triage: Patient states he had a biopsy on his lungs yesterday and has been spitting up blood last night and today.  TRIAGE SUMMARY:   E2C2 Pulmonary Triage - Initial Assessment Questions "Chief Complaint (e.g., cough, sob, wheezing, fever, chills, sweat or additional symptoms) *Go to specific symptom protocol after initial questions. Hemoptysis, Dime Sized the first time Small amount the second time Traces the third time.   "How long have symptoms been present?" Since Lung Biopsy   Have you tested for COVID or Flu? Note: If not, ask patient if a home test can be taken. If so, instruct patient to call back for positive results. Yes  MEDICINES:   "Have you used any OTC meds to help with symptoms?" Yes If yes, ask "What medications?"  None   Reason for Disposition  Coughing up blood (all other triage questions negative)  Answer Assessment - Initial Assessment Questions 1. ONSET: "When did the cough begin?"      After the lung biopsy  2. SEVERITY: "How bad is the cough today?" "Did the blood appear after a coughing spell?"      Only appeared after lung biopsy  3. SPUTUM: "Describe the color of your sputum" (none, dry cough; clear, white, yellow, green)     None 4. HEMOPTYSIS: "How much blood?" (flecks, streaks, tablespoons, etc.)     Dime Sized to Scant 5. DIFFICULTY BREATHING: "Are you having difficulty breathing?" If Yes, ask: "How bad is it?" (e.g., mild, moderate, severe)    - MILD: No SOB at rest, mild SOB with walking, speaks normally in sentences, can lie down, no retractions, pulse < 100.    - MODERATE: SOB at rest, SOB with minimal exertion and prefers to sit, cannot lie down flat, speaks in phrases, mild retractions, audible wheezing, pulse 100-120.    - SEVERE: Very SOB at rest, speaks in single words, struggling to breathe,  sitting hunched forward, retractions, pulse > 120       None  6. FEVER: "Do you have a fever?" If Yes, ask: "What is your temperature, how was it measured, and when did it start?"     None  8. LUNG HISTORY: "Do you have any history of lung disease?"  (e.g., pulmonary embolus, asthma, emphysema)     *Unable to ask during triage  9. PE RISK FACTORS: "Do you have a history of blood clots?" (or: recent major surgery, recent prolonged travel, bedridden)     No  10. OTHER SYMPTOMS: "Do you have any other symptoms?" (e.g., runny nose, wheezing, chest pain)       No other symptoms  12. TRAVEL: "Have you traveled out of the country in the last month?" (e.g., travel history, exposures)       No  Protocols used: Coughing Up Blood-A-AH

## 2024-04-30 NOTE — Telephone Encounter (Signed)
 I spoke with the patient. He said he had a little blood in his spit last night and this morning. He has not seen any since this morning.   Per Dr. Darnelle Elders, this is normal and to be expected.  I have notified the patient.  Nothing further needed.

## 2024-04-30 NOTE — Anesthesia Postprocedure Evaluation (Signed)
 Anesthesia Post Note  Patient: Andres Fernandez  Procedure(s) Performed: ROBOTIC ASSISTED NAVIGATIONAL BRONCHOSCOPY (Bilateral) ENDOBRONCHIAL ULTRASOUND (EBUS) (Bilateral)  Patient location during evaluation: PACU Anesthesia Type: General Level of consciousness: awake and alert Pain management: pain level controlled Vital Signs Assessment: post-procedure vital signs reviewed and stable Respiratory status: spontaneous breathing, nonlabored ventilation, respiratory function stable and patient connected to nasal cannula oxygen Cardiovascular status: blood pressure returned to baseline and stable Postop Assessment: no apparent nausea or vomiting Anesthetic complications: no   No notable events documented.   Last Vitals:  Vitals:   04/29/24 1245 04/29/24 1256  BP: 106/77 113/74  Pulse: 73 64  Resp: (!) 29 16  Temp: (!) 36.2 C 36.4 C  SpO2: 98% 100%    Last Pain:  Vitals:   04/29/24 1256  TempSrc: Temporal  PainSc: 0-No pain                 Zula Hitch

## 2024-05-01 LAB — ACID FAST SMEAR (AFB, MYCOBACTERIA): Acid Fast Smear: NEGATIVE

## 2024-05-02 ENCOUNTER — Other Ambulatory Visit: Payer: Self-pay

## 2024-05-02 ENCOUNTER — Telehealth: Payer: Self-pay

## 2024-05-02 LAB — CULTURE, RESPIRATORY W GRAM STAIN: Culture: NO GROWTH

## 2024-05-02 NOTE — Telephone Encounter (Signed)
 Copied from CRM [id number]. Topic: Clinical - Lab/Test Results >> May 02, 2024 12:18 PM Crist Dominion wrote: Reason for CRM: Dr. Bernetta Brilliant fro [facility] is requesting a call back today from Dr. Allene Ivan nurse to discuss patients test results - Please call  [phone]

## 2024-05-02 NOTE — Telephone Encounter (Signed)
 Patient is scheduled to see [facility] (Dr. Epifania Haskell office) next week.   Nothing further needed.

## 2024-05-06 NOTE — Progress Notes (Signed)
 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  INTERVAL HISTORY:   Andres Fernandez is a 68 y.o. male here for continued evaluation and management of his multiple myeloma and continued Carfilzomib  maintenance and newly diagnosed lung adenocarcinoma.    Patient was last seen by me on 03/28/2024 and reported having a respiratory infection two weeks prior with fever which had resolved, chills, SOB, cough, sluggishness, and headache. He also reported taste issues, clear phlegm, one episode of sudden sharp pain in his left side, likely from a pinched nerve in the back.   Patient received transbronchial biopsy of RLL mass on 04/29/2024 which showed findings of lung adenocarcinoma.   Patient was recently hospitalized for the flu. He notes that he initially presented to urgent care. Patient was treated with Tamiflu  and reports that he felt more poorly 1 week after taking Tamiflu . When he was tested for flu again, he decided not to take Tamiflu  because it did not improve his symptoms.   Patient has not been taking Revlimid .   Patient reports that he is needing to follow up with his pulmonologist on June 4 for follow up after his recent procedure.   He denies cough or SOB. Patient is no longer using a nebulizer. He reports that he feels that he has returned to baseline after his recent infection.   He denies coughing up blood. Patient notes having less than a dime-sized amount of bleeding after his recent biopsy.   He denies any headaches. Patient reports an episode of blurry vision one day which lasted less than a minute while at work  He denies any abdominal pain at this time. Patient denies any other lumps/bumps or back pain.  REVIEW  OF SYSTEMS:    10 Point review of Systems was done is negative except as noted above.   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   SIRS (systemic inflammatory response syndrome) (HCC)    Thrombocytopenia (HCC)    Tobacco abuse     SURGICAL HISTORY: Past Surgical History:  Procedure Laterality Date   APPENDECTOMY     BRONCHOSCOPY, WITH BIOPSY USING ELECTROMAGNETIC NAVIGATION Bilateral 04/29/2024   Procedure: ROBOTIC ASSISTED NAVIGATIONAL BRONCHOSCOPY;  Surgeon: [name], MD;  Location: [facility] ORS;  Service: Pulmonary;  Laterality: Bilateral;   ENDOBRONCHIAL ULTRASOUND Bilateral 04/29/2024   Procedure: ENDOBRONCHIAL ULTRASOUND (EBUS);  Surgeon: [name], MD;  Location: [facility];  Service: Pulmonary;  Laterality: Bilateral;   FEMUR IM NAIL Right 11/20/2017   Procedure: RIGHT FEMORAL INTRAMEDULLARY (IM) NAIL;  Surgeon: Janeth Medicus, MD;  Location: [facility] OR;  Service: Orthopedics;  Laterality: Right;   IR IMAGING GUIDED PORT INSERTION  08/19/2021   PENILE PROSTHESIS IMPLANT      SOCIAL HISTORY: Social History   Socioeconomic History   Marital status: Single    Spouse name: Not on file   Number of children: Not on file   Years of education: Not on file  Highest education level: Not on file  Occupational History   Not on file  Tobacco Use   Smoking status: Some Days    Current packs/day: 0.25    Average packs/day: 0.3 packs/day for 0.1 years    Types: Cigarettes    Start date: 04/16/2024    Last attempt to quit: 11/09/2017   Smokeless tobacco: Never   Tobacco comments:    Smokes a cigarette "every once in a while" starting about 2 weeks ago - 04/23/24  Vaping  Use   Vaping status: Never Used  Substance and Sexual Activity   Alcohol use: Not Currently   Drug use: Not Currently   Sexual activity: Not on file  Other Topics Concern   Not on file  Social History Narrative   Not on file   Social Drivers of Health   Financial Resource Strain: Not on file  Food Insecurity: No Food Insecurity (04/17/2024)   Hunger Vital Sign    Worried About Running Out of Food in the Last Year: Never true    Ran Out of Food in the Last Year: Never true  Transportation Needs: No Transportation Needs (04/17/2024)   PRAPARE - Administrator, Civil Service (Medical): No    Lack of Transportation (Non-Medical): No  Physical Activity: Not on file  Stress: Not on file  Social Connections: Moderately Integrated (04/17/2024)   Social Connection and Isolation Panel [NHANES]    Frequency of Communication with Friends and Family: More than three times a week    Frequency of Social Gatherings with Friends and Family: Three times a week    Attends Religious Services: More than 4 times per year    Active Member of Clubs or Organizations: Yes    Attends Banker Meetings: More than 4 times per year    Marital Status: Divorced  Intimate Partner Violence: Not At Risk (04/17/2024)   Humiliation, Afraid, Rape, and Kick questionnaire    Fear of Current or Ex-Partner: No    Emotionally Abused: No    Physically Abused: No    Sexually Abused: No    FAMILY HISTORY: Family History  Problem Relation Age of Onset   COPD Mother    Liver cancer Mother     ALLERGIES:  has no known allergies.  MEDICATIONS:  . Current Outpatient Medications on File Prior to Visit  Medication Sig Dispense Refill   acyclovir  (ZOVIRAX ) 400 MG tablet Take 1 tablet (400 mg total) by mouth daily. 90 tablet 6   albuterol  (VENTOLIN  HFA) 108 (90 Base) MCG/ACT inhaler Inhale 2 puffs into the lungs every 6 (six) hours as needed for shortness of breath.     aspirin  EC 81 MG tablet Take 1  tablet (81 mg total) by mouth daily. 60 tablet 11   ferrous sulfate  325 (65 FE) MG EC tablet TAKE 1 TABLET BY MOUTH EVERY DAY WITH BREAKFAST (Patient not taking: No sig reported) 90 tablet 1   Fluticasone Propionate , Inhal, (FLUTICASONE PROPIONATE  DISKUS) 250 MCG/ACT AEPB TAKE 1 PUFF BY MOUTH EVERY DAY (Patient not taking: No sig reported) 60 each 1   [Paused] lenalidomide  (REVLIMID ) 10 MG capsule Take 1 capsule (10 mg total) by mouth daily. Take 1 capsule (10 mg total) by mouth daily for 21 days. Take 7 days off. Repeat. 21 capsule 0   lidocaine -prilocaine  (EMLA ) cream Apply 1 Application topically as needed. Apply to Port-A-Cath at least 1 hr prior to treatment. 30 g 0   Multiple Vitamins-Minerals (MULTIVITAMIN WITH MINERALS)  tablet Take 1 tablet by mouth at bedtime.     nicotine  (NICODERM CQ  - DOSED IN MG/24 HOURS) 14 mg/24hr patch PLACE 1 PATCH (14 MG TOTAL) ONTO THE SKIN DAILY. (Patient not taking: Reported on 04/17/2024) 28 patch 1   Spacer/Aero-Holding Chambers (AEROCHAMBER MV) inhaler Use as instructed 1 each 0   Vitamin D , Ergocalciferol , (DRISDOL ) 1.25 MG (50000 UNIT) CAPS capsule Take 1 capsule (50,000 Units total) by mouth once a week. (Patient not taking: Reported on 04/17/2024) 12 capsule 6   Current Facility-Administered Medications on File Prior to Visit  Medication Dose Route Frequency Provider Last Rate Last Admin   acetaminophen  (TYLENOL ) 325 MG tablet            famotidine  (PEPCID ) 20 MG tablet             PHYSICAL EXAMINATION:  ECOG FS:1 - Symptomatic but completely ambulatory .BP (!) 151/100 Comment: Made RN Costella Dirks aware  Pulse 91   Temp 98.1 F (36.7 C)   Resp 20   Wt 152 lb 12.8 oz (69.3 kg)   SpO2 100%   BMI 23.23 kg/m   Wt Readings from Last 3 Encounters:  04/25/24 145 lb (65.8 kg)  04/23/24 143 lb 12.8 oz (65.2 kg)  04/17/24 145 lb (65.8 kg)   GENERAL:alert, in no acute distress and comfortable SKIN: no acute rashes, no significant lesions EYES: conjunctiva  are pink and non-injected, sclera anicteric OROPHARYNX: MMM, no exudates, no oropharyngeal erythema or ulceration NECK: supple, no JVD LYMPH:  no palpable lymphadenopathy in the cervical, axillary or inguinal regions LUNGS: clear to auscultation b/l with normal respiratory effort HEART: regular rate & rhythm ABDOMEN:  normoactive bowel sounds , non tender, not distended. Extremity: no pedal edema PSYCH: alert & oriented x 3 with fluent speech NEURO: no focal motor/sensory deficits   LABORATORY DATA:  I have reviewed the data as listed     Latest Ref Rng & Units 04/17/2024   12:18 PM 04/11/2024    8:52 AM 03/28/2024    8:56 AM  CBC  WBC 4.0 - 10.5 K/uL 4.1  7.3  10.6   Hemoglobin 13.0 - 17.0 g/dL 16.1  09.6  04.5   Hematocrit 39.0 - 52.0 % 41.8  37.6  37.4   Platelets 150 - 400 K/uL 136  377  311    CBC    Component Value Date/Time   WBC 4.1 04/17/2024 1218   RBC 4.64 04/17/2024 1218   HGB 13.6 04/17/2024 1218   HGB 13.1 04/11/2024 0852   HGB 8.3 (L) 12/14/2017 1230   HCT 41.8 04/17/2024 1218   HCT 23.9 (L) 12/14/2017 1230   PLT 136 (L) 04/17/2024 1218   PLT 377 04/11/2024 0852   PLT 524 (H) 12/14/2017 1230   MCV 90.1 04/17/2024 1218   MCV 88.4 12/14/2017 1230   MCH 29.3 04/17/2024 1218   MCHC 32.5 04/17/2024 1218   RDW 15.1 04/17/2024 1218   RDW 15.3 (H) 12/14/2017 1230   LYMPHSABS 1.4 04/17/2024 1218   LYMPHSABS 1.4 12/14/2017 1230   MONOABS 0.9 04/17/2024 1218   MONOABS 0.6 12/14/2017 1230   EOSABS 0.0 04/17/2024 1218   EOSABS 0.1 12/14/2017 1230   BASOSABS 0.0 04/17/2024 1218   BASOSABS 0.1 12/14/2017 1230        Latest Ref Rng & Units 04/18/2024    3:45 AM 04/17/2024   12:18 PM 04/11/2024    8:52 AM  CMP  Glucose 70 - 99 mg/dL 83  94  94   BUN 8 - 23 mg/dL 31  33  21   Creatinine 0.61 - 1.24 mg/dL 1.61  0.96  0.45   Sodium 135 - 145 mmol/L 137  138  140   Potassium 3.5 - 5.1 mmol/L 4.5  4.4  4.1   Chloride 98 - 111 mmol/L 108  105  107   CO2 22 - 32 mmol/L  18  21  25    Calcium 8.9 - 10.3 mg/dL 8.0  8.2  9.0   Total Protein 6.5 - 8.1 g/dL  6.9  7.1   Total Bilirubin 0.0 - 1.2 mg/dL  0.6  0.4   Alkaline Phos 38 - 126 U/L  49  70   AST 15 - 41 U/L  24  10   ALT 0 - 44 U/L  17  11     04/29/2024 Cytopathology report:  Fine needle Aspiration, RLL mass - Positive for malignancy, adenocarcinoma Bronchial Brushing, RLL - Positive for malignancy, adenocarcinoma Fine needle aspiration, lymph node station 11L negative for malignancy Fine needle aspiration lymph node 7, station 7, negative for malignancy  03/09/20 ([id number]) Bone Marrow Biopsy at [facility]   07/02/2019 Bone Marrow Biopsy ([id number]) at [facility]    08/07/18 BM Bx:         RADIOGRAPHIC STUDIES: I have personally reviewed the radiological images as listed and agreed with the findings in the report. DG Chest Port 1 View Result Date: 04/29/2024 CLINICAL DATA:  Status post bronchoscopy. EXAM: PORTABLE CHEST 1 VIEW COMPARISON:  Chest x-ray 03/28/2024. FINDINGS: Right chest port catheter tip ends in the SVC. The cardiomediastinal silhouette is within normal limits. Right lower lobe nodular opacity not well-defined on this x-ray. The lungs are otherwise clear. There is no pleural effusion or pneumothorax. No acute fractures are seen. IMPRESSION: No pneumothorax. Electronically Signed   By: Tyron Gallon M.D.   On: 04/29/2024 15:13   DG C-Arm 1-60 Min-No Report Result Date: 04/29/2024 Fluoroscopy was utilized by the requesting physician.  No radiographic interpretation.   NM PET Image Restage (PS) Whole Body Result Date: 04/17/2024 CLINICAL DATA:  Subsequent treatment strategy for multiple myeloma. EXAM: NUCLEAR MEDICINE PET WHOLE BODY TECHNIQUE: 7.65 mCi F-18 FDG was injected intravenously. Full-ring PET imaging was performed from the head to foot after the radiotracer. CT data was obtained and used for attenuation correction and anatomic localization. Fasting blood  glucose: 101 mg/dl COMPARISON:  PET-CT 91/47/8295. Chest CTA 08/23/2022 and 04/17/2024. FINDINGS: Mediastinal blood pool activity: SUV max 1.9 HEAD/ NECK: No hypermetabolic cervical lymph nodes are identified.Fairly symmetric activity within the lymphoid tissue of Waldeyer's ring is within physiologic limits. No suspicious activity identified within the pharyngeal mucosal space. Incidental CT findings: Bilateral carotid atherosclerosis. CHEST: Hypermetabolic spiculated right lower lobe nodule has enlarged from previous chest CT, measuring 2.8 x 2.8 cm on image 52/7 and demonstrates an SUV max of 11.8. There is a satellite nodule measuring 1.9 cm on image 50/7 which is also hypermetabolic (SUV max 5.7). Mildly increased metabolic activity extending proximally along the right lower lobe bronchus. There is a small hypermetabolic right hilar lymph node (SUV max 6.1). No other hypermetabolic thoracic lymph nodes identified. There is no hypermetabolic activity within the left hemithorax. However, there are potentially new other small right upper and lower lobe nodules (images 43 and 37, respectively). Small subpleural left lower lobe nodule on image 58/7 was obscured by parenchymal opacity on the most recent CT, although is grossly unchanged from the  remote PET-CT. Incidental CT findings: Severe centrilobular and paraseptal emphysema. Right IJ Port-A-Cath extends to the superior cavoatrial junction. Mild aortic atherosclerosis. ABDOMEN/PELVIS: There is no hypermetabolic activity within the liver, adrenal glands, spleen or pancreas. There is no hypermetabolic nodal activity in the abdomen or pelvis. There is focal hypermetabolic activity within the left mid abdomen corresponding with small-bowel loops (SUV max 15.3). Based on the configuration of this activity and the lack of any clear corresponding CT abnormality, this may be physiologic. Incidental CT findings: Aortic and branch vessel atherosclerosis. Penile prosthesis  and reservoir are noted. Mild chronic bladder wall thickening. SKELETON: No hypermetabolic osseous lesions are demonstrated to suggest active multiple myeloma. Multiple lytic lesions are again noted throughout the skull, scapula, sternum, ribs, spine and bony pelvis consistent with underlying multiple myeloma. No evidence of osseous metastatic disease. Incidental CT findings: Healing right-sided rib fractures. EXTREMITIES: No hypermetabolic osseous lesions are identified within the extremities. Scattered lucent lesions in the proximal appendicular skeleton may relate to underlying multiple myeloma. Incidental CT findings: Previous right femoral ORIF. IMPRESSION: 1. Hypermetabolic spiculated right lower lobe pulmonary nodule has enlarged from previous chest CT, consistent with primary bronchogenic carcinoma. There is a satellite nodule and hypermetabolic right hilar lymph node, suspicious for metastatic disease. 2. Potential new small right upper and lower lobe nodules, indeterminate. 3. No evidence of hypermetabolic metastatic disease in the abdomen or pelvis. 4. Focal hypermetabolic activity within the left mid abdomen corresponding with small-bowel loops, favored to be physiologic. 5. Multiple chronic lytic lesions throughout the axial and appendicular skeleton consistent with underlying multiple myeloma, without associated hypermetabolic activity. No evidence of hypermetabolic osseous metastatic disease. 6. Aortic Atherosclerosis (ICD10-I70.0) and Emphysema (ICD10-J43.9). Electronically Signed   By: Elmon Hagedorn M.D.   On: 04/17/2024 14:28   CT Angio Chest PE W and/or Wo Contrast Result Date: 04/17/2024 CLINICAL DATA:  Pulmonary embolism (PE) suspected, high prob History of multiple myeloma. EXAM: CT ANGIOGRAPHY CHEST WITH CONTRAST TECHNIQUE: Multidetector CT imaging of the chest was performed using the standard protocol during bolus administration of intravenous contrast. Multiplanar CT image  reconstructions and MIPs were obtained to evaluate the vascular anatomy. RADIATION DOSE REDUCTION: This exam was performed according to the departmental dose-optimization program which includes automated exposure control, adjustment of the mA and/or kV according to patient size and/or use of iterative reconstruction technique. CONTRAST:  70mL OMNIPAQUE  IOHEXOL  350 MG/ML SOLN COMPARISON:  PET-CT 04/11/2024.  Chest CTA 08/23/2022. FINDINGS: Cardiovascular: The pulmonary arteries are well opacified with contrast to the level of the segmental branches. There is no evidence of acute pulmonary embolism. Mild aortic atherosclerosis without evidence of acute systemic arterial abnormality. The heart size is normal. There is no pericardial effusion. Mediastinum/Nodes: There are no enlarged mediastinal, hilar or axillary lymph nodes.Mildly prominent mediastinal and hilar lymph nodes appear similar to previous CT. The thyroid gland, trachea and esophagus demonstrate no significant findings. Lungs/Pleura: No pleural effusion or pneumothorax. Severe centrilobular and paraseptal emphysema. Spiculated right lower lobe mass measuring 3.1 x 2.9 cm on image 111/12 has enlarged from the previous CT and was hypermetabolic on PET-CT. There is a satellite spiculated nodule measuring 2.1 x 1.8 cm on image 103/12. Possible additional other new smaller pulmonary nodules in the right lower lobe (image 75/12), the right upper lobe (image 81/12) and the left lower lobe (image 120/12). No confluent airspace disease. Upper abdomen: No significant findings are seen in the visualized upper abdomen. Musculoskeletal/Chest wall: There is no chest wall mass or suspicious osseous finding.  Review of the MIP images confirms the above findings. IMPRESSION: 1. No evidence of acute pulmonary embolism or other acute vascular findings in the chest. 2. Enlarging spiculated right lower lobe mass with satellite nodule and possible additional new smaller pulmonary  nodules, highly suspicious for primary bronchogenic carcinoma with metastatic disease. See forthcoming PET-CT report. 3. Aortic Atherosclerosis (ICD10-I70.0) and Emphysema (ICD10-J43.9). Electronically Signed   By: Elmon Hagedorn M.D.   On: 04/17/2024 14:09   ASSESSMENT & PLAN:    68 y.o. 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   Initial BM Bx showed >95% involvement with kappa restricted serum free light chains. 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 MoL Cy-   08/07/18 BM Bx revealed normocellular bone marrow with trilineage hematopoiesis and 1% plasma cells which indicates the pt is in remission 08/06/18 PET/CT revealed  Expansile lytic lesion involving the left iliac bone, unchanged. Lytic lesion involving the sternum, unchanged. While both are suspicious for myeloma, neither lesion is FDG avid. No hypermetabolic osseous lesions in the visualized axial and appendicular skeleton. Please note that the bilateral lower extremities were not imaged. No suspicious lymphadenopathy. Spleen is normal in size.    Pt began Ninlaro  3mg  maintenance after pt finished 13 cycles of CyBorD and BM bx showed only 1% plasma cells, then resumed CyBorD and held Ninlaro  in anticipation of his tandem bone marrow transplants beginning in late December 2019 with Dr. Kermit Ped at [facility]   11/19/18 High dose chemotherapy with Melphalan 140mg /m2 and autologous stem cell rescue  03/20/19 High dose chemotherapy with Melphalan 200mg /m2 and autologous stem cell rescue. Non-infectious diarrhea, uncomplicated course otherwise.  07/02/2019 PET scan with results revealing "1. No FDG avid bone lesions are identified. 2.   Asymmetric uptake within the right, greater than left, palatine tonsils with mild fullness of the right tonsillar soft tissue. ENT consult vs attention on follow up suggested. 3.  Innumerable mixed sclerotic and lytic lesions throughout the axial and appendicular skeleton without associated hypermetabolic activity. 4.  Ancillary CT findings as above."  07/02/2019 bone marrow biopsy ([id number])with results showing: "Trilineage hematopoieses with maturation. No plasma cell myeloma identified. Mild anemia and No Rouleaux or circulating plasma cells identified in the peripheral blood."  2) s/p Prophylactic IM nailing for rt femur  completed [facility] Therapy at the [facility].  -patient previously reported improvement and is currently progressively back to full time work.  3) Thrombocytopenia resolved   4) Renal insufficiency --likely related to multiple myeloma.  Remained fairly stable with baseline creatinine of 1.5-2.1.  Creatinine is 1.44 today Plan -maintain follow up with nephrology referral to Dr Irene Mannheim [facility] for continued treatment -Pt completed 8 weeks of maverick for hep c with Dr Seymour Dapper on 07/08/18.  -Kidney numbers improved. Creatinine in the 1.7-2.2 range   5) h/o tobacco abuse-counseled on smoking cessation -Currently not smoking.   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 Hemoglobin is stable at 12.8  8) Newly diagnosed lung adenocarcinoma  PLAN:  -discussed that sometimes after having a flu, there can be a secondary bacterial infection, called bacterial pneumonia -Patient reports that he has returned to baseline at  this time from a respiratory infection standpoint -04/11/2024 PET scan showed: "1. Hypermetabolic spiculated right lower lobe pulmonary nodule has enlarged from previous chest CT, consistent with primary bronchogenic carcinoma. There is a satellite nodule and  hypermetabolic right hilar lymph node, suspicious for metastatic disease. 2. Potential new small right upper and lower lobe nodules, indeterminate. 3. No evidence of hypermetabolic metastatic disease in the abdomen or pelvis. 4. Focal hypermetabolic activity within the left mid abdomen corresponding with small-bowel loops, favored to be physiologic. 5. Multiple chronic lytic lesions throughout the axial and appendicular skeleton consistent with underlying multiple myeloma, without associated hypermetabolic activity. No evidence of hypermetabolic osseous metastatic disease. 6. Aortic Atherosclerosis (ICD10-I70.0) and Emphysema (ICD10-J43.9)." -discussed that his bronchoscopy biopsy report 04/29/2024 showed primary lung cancer unrelated to myeloma. -Discussed recent pathology results which showed findings of adenocarcinoma. Fine needle Aspiration, RLL mass and Bronchial Brushing, RLL were positive for malignancy, adenocarcinoma. He had two lymph nodes biopsied which did not show obvious malignancy.  -educated patient that there are different types of lung cancers, including small cell lung cancer, which tends to be much faster growing, and non-small cell lung cancer, which initially tends to grow locally before spreading beyond.  -discussed that patient has non-small cell lung cancer, specifically adenocarcinoma.  -he appears to have stage 1-2 lung cancer -Will order Brain MRI to finish staging -discussed that standard treatment for his adenocarcinoma would be to see if there can be surgical removal for best chance of cure. Discussed that if this is possible, and if his remaining lung capacity is adequate, he may need to have removal of part of his lung, though the final decision in this matter will be made by his surgeon.  -will place urgent referral to cardiothoracic surgeon, Dr, Luna Salinas, to consider surgical removal of newly diagnosed lung adenocarcinoma -Discussed that there can sometimes be a role  for localized radiation therapy or other treatment after surgery to avoid non-small cell lung cancer recurrence.  -discussed that depending on the molecular subtype of lung adenocarcinoma, after surgery, the disease can be driven by specific genetic mutations and can be targeted with targeted therapies.  -discussed that there will be more genetic testing based on final pathology results.  -will order lung function testing -he continues to be remission from a myeloma standpoint -will hold myeloma treatment for one month   FOLLOW-UP: -Urgent referral to Dr Luna Salinas for newly diagnosed Lung adenocarcinoma -Referral for PFTs -MRI brain in 5-7 adys -hold myeloma treatments -RTC with Dr Salomon Cree with labs portflush and labs in 4 weeks  The total time spent in the appointment was 40 minutes* .  All of the patient's questions were answered with apparent satisfaction. The patient knows to call the clinic with any problems, questions or concerns.   Jacquelyn Matt MD MS AAHIVMS [facility]ology/Oncology Physician [facility]  .*Total Encounter Time as defined by the Centers for Medicare and Medicaid Services includes, in addition to the face-to-face time of a patient visit (documented in the note above) non-face-to-face time: obtaining and reviewing outside history, ordering and reviewing medications, tests or procedures, care coordination (communications with other health care professionals or caregivers) and documentation in the medical record.     I,Mitra Faeizi,acting as a Neurosurgeon for Jacquelyn Matt, MD.,have documented all relevant documentation on the behalf of Jacquelyn Matt, MD,as directed by  Jacquelyn Matt, MD while in the presence of Jacquelyn Matt, MD.  .I have reviewed the above documentation for accuracy and completeness, and I  agree with the above. .Chamya Hunton Kishore Haynes Giannotti MD

## 2024-05-07 ENCOUNTER — Other Ambulatory Visit: Payer: Self-pay | Admitting: Hematology

## 2024-05-07 ENCOUNTER — Other Ambulatory Visit: Payer: Self-pay

## 2024-05-07 ENCOUNTER — Inpatient Hospital Stay: Admitting: Hematology

## 2024-05-07 VITALS — BP 151/100 | HR 91 | Temp 98.1°F | Resp 20 | Wt 152.8 lb

## 2024-05-07 DIAGNOSIS — C9010 Plasma cell leukemia not having achieved remission: Secondary | ICD-10-CM | POA: Diagnosis not present

## 2024-05-07 DIAGNOSIS — C3490 Malignant neoplasm of unspecified part of unspecified bronchus or lung: Secondary | ICD-10-CM

## 2024-05-07 DIAGNOSIS — Z5111 Encounter for antineoplastic chemotherapy: Secondary | ICD-10-CM | POA: Diagnosis not present

## 2024-05-07 DIAGNOSIS — C3491 Malignant neoplasm of unspecified part of right bronchus or lung: Secondary | ICD-10-CM

## 2024-05-08 LAB — CYTOLOGY - NON PAP

## 2024-05-09 ENCOUNTER — Other Ambulatory Visit

## 2024-05-09 ENCOUNTER — Ambulatory Visit

## 2024-05-09 ENCOUNTER — Ambulatory Visit: Admitting: Physician Assistant

## 2024-05-09 LAB — SURGICAL PATHOLOGY

## 2024-05-10 ENCOUNTER — Other Ambulatory Visit: Payer: Self-pay

## 2024-05-13 ENCOUNTER — Ambulatory Visit
Attending: Thoracic Surgery (Cardiothoracic Vascular Surgery) | Admitting: Thoracic Surgery (Cardiothoracic Vascular Surgery)

## 2024-05-13 ENCOUNTER — Other Ambulatory Visit: Payer: Self-pay | Admitting: Thoracic Surgery (Cardiothoracic Vascular Surgery)

## 2024-05-13 ENCOUNTER — Encounter: Payer: Self-pay | Admitting: Hematology

## 2024-05-13 VITALS — BP 155/98 | HR 100 | Resp 20 | Ht 68.0 in | Wt 152.0 lb

## 2024-05-13 DIAGNOSIS — R918 Other nonspecific abnormal finding of lung field: Secondary | ICD-10-CM

## 2024-05-13 NOTE — Progress Notes (Signed)
 [address] [place] [address]             [phone]      PCP is [name], FNP Referring Provider is [name], MD  Chief Complaint  Patient presents with   Lung Lesion    Bronch 04/29/24/ Chest CT 04/29/24/ CTA Chest 04/17/24/ PET Scan 04/11/24/ PFT's-pending     HPI: Mr. Andres Fernandez is in for consultation regarding an adenocarcinoma of the right lower lobe.  Aram Domzalski is a 68 year old man with a past medical history significant for tobacco abuse, multiple myeloma, plasma cell leukemia, COPD, hepatitis C, chronic kidney disease, BPH, and recent pneumonia.  Diagnosed with multiple myeloma in 2019.  Currently on maintenance therapy with carfilazomib.    Recently had a PET/CT for follow-up and was found to have an enlarging right lower lobe mass that was markedly hypermetabolic with a satellite nodule, hilar adenopathy, and borderline mediastinal adenopathy.  He underwent robotic bronchoscopy and endobronchial ultrasound by Dr. Darnelle Elders.  Biopsy showed adenocarcinoma.  Needle aspirations of the level 7 and 11 nodes negative (says 11L in the op note but likely 11R).  No mention of whether lymphocytes were present on the samples.  He smoked about three fourths of a pack per day for 45 years.  Quit smoking about 5 years ago but would occasionally have a cigarette "now and then."  Currently not smoking at all.  No change in appetite or weight loss.  Denies headaches or visual changes.  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  Can walk up a flight of stairs without stopping.  Zubrod Score: At the time of surgery this patient's most appropriate activity status/level should be described as: []     0    Normal activity, no symptoms [x]     1    Restricted in physical strenuous activity but ambulatory, able to do out light work []     2    Ambulatory and capable of self care, unable to do work activities, up and about >50 % of waking hours                               []     3    Only limited self care, in bed greater than 50% of waking hours []     4    Completely disabled, no self care, confined to bed or chair []     5    Moribund  Past Medical History:  Diagnosis Date   AKI (acute kidney injury) (HCC)    ARF (acute renal failure) (HCC)    BPH (benign prostatic hyperplasia)    Cancer (HCC)    COPD (chronic obstructive pulmonary disease) (HCC)    Hepatitis C 11/27/2017   Hypertension    Multiple myeloma (HCC)    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   SIRS (systemic inflammatory response syndrome) (HCC)    Thrombocytopenia (HCC)    Tobacco abuse     Past Surgical History:  Procedure Laterality Date   APPENDECTOMY     BRONCHOSCOPY, WITH BIOPSY USING ELECTROMAGNETIC NAVIGATION Bilateral 04/29/2024   Procedure: ROBOTIC ASSISTED NAVIGATIONAL BRONCHOSCOPY;  Surgeon: [name], MD;  Location: [facility] ORS;  Service: Pulmonary;  Laterality: Bilateral;   ENDOBRONCHIAL ULTRASOUND Bilateral 04/29/2024   Procedure: ENDOBRONCHIAL ULTRASOUND (EBUS);  Surgeon: [name], MD;  Location: [facility] ORS;  Service: Pulmonary;  Laterality: Bilateral;   FEMUR IM NAIL Right  11/20/2017   Procedure: RIGHT FEMORAL INTRAMEDULLARY (IM) NAIL;  Surgeon: Janeth Medicus, MD;  Location: [facility] OR;  Service: Orthopedics;  Laterality: Right;   IR IMAGING GUIDED PORT INSERTION  08/19/2021   PENILE PROSTHESIS IMPLANT      Family History  Problem Relation Age of Onset   COPD Mother    Liver cancer Mother     Social History Social History   Tobacco Use   Smoking status: Some Days    Current packs/day: 0.25    Average packs/day: 0.3 packs/day for 0.1 years    Types: Cigarettes    Start date: 04/16/2024    Last attempt to quit: 11/09/2017   Smokeless tobacco: Never   Tobacco comments:    Smokes a cigarette "every once in a while" starting about 2 weeks ago - 04/23/24  Vaping Use   Vaping status: Never Used  Substance Use Topics   Alcohol use: Not Currently    Drug use: Not Currently    Current Outpatient Medications  Medication Sig Dispense Refill   acyclovir  (ZOVIRAX ) 400 MG tablet Take 1 tablet (400 mg total) by mouth daily. 90 tablet 6   albuterol  (VENTOLIN  HFA) 108 (90 Base) MCG/ACT inhaler Inhale 2 puffs into the lungs every 6 (six) hours as needed for shortness of breath.     aspirin  EC 81 MG tablet Take 1 tablet (81 mg total) by mouth daily. 60 tablet 11   [Paused] lenalidomide  (REVLIMID ) 10 MG capsule Take 1 capsule (10 mg total) by mouth daily. Take 1 capsule (10 mg total) by mouth daily for 21 days. Take 7 days off. Repeat. 21 capsule 0   lidocaine -prilocaine  (EMLA ) cream Apply 1 Application topically as needed. Apply to Port-A-Cath at least 1 hr prior to treatment. 30 g 0   Multiple Vitamins-Minerals (MULTIVITAMIN WITH MINERALS) tablet Take 1 tablet by mouth at bedtime.     Spacer/Aero-Holding Chambers (AEROCHAMBER MV) inhaler Use as instructed 1 each 0   ferrous sulfate  325 (65 FE) MG EC tablet TAKE 1 TABLET BY MOUTH EVERY DAY WITH BREAKFAST (Patient not taking: No sig reported) 90 tablet 1   Fluticasone Propionate , Inhal, (FLUTICASONE PROPIONATE  DISKUS) 250 MCG/ACT AEPB TAKE 1 PUFF BY MOUTH EVERY DAY (Patient not taking: No sig reported) 60 each 1   nicotine  (NICODERM CQ  - DOSED IN MG/24 HOURS) 14 mg/24hr patch PLACE 1 PATCH (14 MG TOTAL) ONTO THE SKIN DAILY. (Patient not taking: Reported on 04/17/2024) 28 patch 1   Vitamin D , Ergocalciferol , (DRISDOL ) 1.25 MG (50000 UNIT) CAPS capsule Take 1 capsule (50,000 Units total) by mouth once a week. (Patient not taking: Reported on 04/17/2024) 12 capsule 6   No current facility-administered medications for this visit.   Facility-Administered Medications Ordered in Other Visits  Medication Dose Route Frequency Provider Last Rate Last Admin   acetaminophen  (TYLENOL ) 325 MG tablet            famotidine  (PEPCID ) 20 MG tablet             No Known Allergies  Review of Systems  Constitutional:   Negative for activity change and unexpected weight change.  HENT:  Negative for trouble swallowing and voice change.   Respiratory:  Positive for shortness of breath (With exertion, can walk up a flight of stairs).   Cardiovascular:  Negative for chest pain, palpitations and leg swelling.  All other systems reviewed and are negative.   BP (!) 155/98   Pulse 100   Resp 20   Ht  5\' 8"  (1.727 m)   Wt 152 lb (68.9 kg)   SpO2 98% Comment: RA  BMI 23.11 kg/m  Physical Exam   Diagnostic Tests: CT CHEST WITHOUT CONTRAST   TECHNIQUE: Multidetector CT imaging of the chest was performed using thin slice collimation for electromagnetic bronchoscopy planning purposes, without intravenous contrast.   RADIATION DOSE REDUCTION: This exam was performed according to the departmental dose-optimization program which includes automated exposure control, adjustment of the mA and/or kV according to patient size and/or use of iterative reconstruction technique.   COMPARISON:  04/17/2024, PET CT 04/11/2024   FINDINGS: Cardiovascular: No significant vascular findings. Normal heart size. No pericardial effusion. Right internal jugular chest port seen at the superior cavoatrial junction.   Mediastinum/Nodes: No enlarged mediastinal, hilar, or axillary lymph nodes. Thyroid gland, trachea, and esophagus demonstrate no significant findings.   Lungs/Pleura:   Severe emphysema   Spiculated 3.3 cm mass is again seen within the peripheral right lower lobe with pleural tethering with a more central satellite nodule measuring 18 mm. Additional 8 mm more central nodule within the right lower lobe at the hilum (96/4) may represent a hilar or intrapulmonary lymph node and was hypermetabolic on prior examination.   Stable indeterminate solid subpleural pulmonary nodules (90/4, 4 mm, right upper lobe) (135/4, 5 mm, left lower lobe) (104/4, 5 mm, right lower lobe). No new focal pulmonary nodules or  infiltrates.   No pneumothorax or pleural effusion.   Upper Abdomen: No acute abnormality.   Musculoskeletal: The osseous structures are diffusely osteopenic and there are lytic lesions noted within the sternum and scattered within the thoracic vertebra in keeping with changes of underlying multiple myeloma.   IMPRESSION: 1. Stable spiculated 3.3 cm mass within the peripheral right lower lobe with a more central satellite nodule measuring 18 mm in keeping with multifocal neoplasm. 2. Additional 8 mm more central nodule within the right lower lobe at the hilum may represent a hilar or intrapulmonary lymph node and was hypermetabolic on prior examination in keeping with nodal metastatic disease. 3. Stable indeterminate solid subpleural pulmonary nodules measuring up to 5 mm. Severe emphysema. 4. Multiple lytic lesions within the axial skeleton in keeping with underlying multiple myeloma.   Emphysema (ICD10-J43.9).     Electronically Signed   By: Worthy Heads M.D.   On: 05/11/2024 01:28 NUCLEAR MEDICINE PET WHOLE BODY   TECHNIQUE: 7.65 mCi F-18 FDG was injected intravenously. Full-ring PET imaging was performed from the head to foot after the radiotracer. CT data was obtained and used for attenuation correction and anatomic localization.   Fasting blood glucose: 101 mg/dl   COMPARISON:  PET-CT 29/56/2130. Chest CTA 08/23/2022 and 04/17/2024.   FINDINGS: Mediastinal blood pool activity: SUV max 1.9   HEAD/ NECK:   No hypermetabolic cervical lymph nodes are identified.Fairly symmetric activity within the lymphoid tissue of Waldeyer's ring is within physiologic limits. No suspicious activity identified within the pharyngeal mucosal space.   Incidental CT findings: Bilateral carotid atherosclerosis.   CHEST:   Hypermetabolic spiculated right lower lobe nodule has enlarged from previous chest CT, measuring 2.8 x 2.8 cm on image 52/7 and demonstrates an SUV max of  11.8. There is a satellite nodule measuring 1.9 cm on image 50/7 which is also hypermetabolic (SUV max 5.7). Mildly increased metabolic activity extending proximally along the right lower lobe bronchus. There is a small hypermetabolic right hilar lymph node (SUV max 6.1). No other hypermetabolic thoracic lymph nodes identified. There is no hypermetabolic activity within  the left hemithorax. However, there are potentially new other small right upper and lower lobe nodules (images 43 and 37, respectively). Small subpleural left lower lobe nodule on image 58/7 was obscured by parenchymal opacity on the most recent CT, although is grossly unchanged from the remote PET-CT.   Incidental CT findings: Severe centrilobular and paraseptal emphysema. Right IJ Port-A-Cath extends to the superior cavoatrial junction. Mild aortic atherosclerosis.   ABDOMEN/PELVIS:   There is no hypermetabolic activity within the liver, adrenal glands, spleen or pancreas. There is no hypermetabolic nodal activity in the abdomen or pelvis. There is focal hypermetabolic activity within the left mid abdomen corresponding with small-bowel loops (SUV max 15.3). Based on the configuration of this activity and the lack of any clear corresponding CT abnormality, this may be physiologic.   Incidental CT findings: Aortic and branch vessel atherosclerosis. Penile prosthesis and reservoir are noted. Mild chronic bladder wall thickening.   SKELETON:   No hypermetabolic osseous lesions are demonstrated to suggest active multiple myeloma. Multiple lytic lesions are again noted throughout the skull, scapula, sternum, ribs, spine and bony pelvis consistent with underlying multiple myeloma. No evidence of osseous metastatic disease.   Incidental CT findings: Healing right-sided rib fractures.   EXTREMITIES: No hypermetabolic osseous lesions are identified within the extremities. Scattered lucent lesions in the  proximal appendicular skeleton may relate to underlying multiple myeloma.   Incidental CT findings: Previous right femoral ORIF.   IMPRESSION: 1. Hypermetabolic spiculated right lower lobe pulmonary nodule has enlarged from previous chest CT, consistent with primary bronchogenic carcinoma. There is a satellite nodule and hypermetabolic right hilar lymph node, suspicious for metastatic disease. 2. Potential new small right upper and lower lobe nodules, indeterminate. 3. No evidence of hypermetabolic metastatic disease in the abdomen or pelvis. 4. Focal hypermetabolic activity within the left mid abdomen corresponding with small-bowel loops, favored to be physiologic. 5. Multiple chronic lytic lesions throughout the axial and appendicular skeleton consistent with underlying multiple myeloma, without associated hypermetabolic activity. No evidence of hypermetabolic osseous metastatic disease. 6. Aortic Atherosclerosis (ICD10-I70.0) and Emphysema (ICD10-J43.9).     Electronically Signed   By: Elmon Hagedorn M.D.   On: 04/17/2024 14:28   I personally reviewed the CT and PET/CT images.  There is a 3.3 cm spiculated right lower lobe mass with adjacent satellite nodule.  Both are markedly hypermetabolic.  There also is an enlarged and hypermetabolic 11R node bifurcation of the upper lobe bronchus and the bronchus intermedius.  Chronic bone lesions without metabolic activity.  Emphysema.  Aortic atherosclerosis.  Impression: Andres Fernandez is a 68 year old man with a past medical history significant for tobacco abuse, multiple myeloma, plasma cell leukemia, COPD, hepatitis C, chronic kidney disease, BPH, and recent pneumonia.  Recently had a PET/CT to evaluate status of his multiple myeloma.  Found to have an enlarging right lower lobe mass with satellite nodule and hilar and borderline mediastinal adenopathy.  Biopsy showed adenocarcinoma.  Adenocarcinoma right lower lobe-at least stage  IIb (T3, N0) but more likely stage IIIa (T3, N1).  In either event will need multimodality therapy.  Options include chemotherapy with either surgery or radiation.  Recently we have had good success with neoadjuvant chemoimmunotherapy followed by resection with a high proportion of complete pathologic responses.  His case is obviously unique given his multiple myeloma and plasma cell leukemia.  Will need to discuss with Dr. Salomon Cree.  If his pulmonary function is adequate then potentially candidate for right lower lobectomy.  Could require bilobectomy.  I doubt he would be a candidate for a pneumonectomy in the event that 11R node was actually positive.  Therefore I would favor neoadjuvant treatment prior to surgical resection.  I cannot find any record of pulmonary function testing in his chart.  Will order pulmonary function testing with and without bronchodilators.  I discussed the general nature of the surgical procedure.  Informed him of the need for general anesthesia, the incisions to be used, the use of the surgical robot, the use of drains to postoperatively, the expected hospital stay, and the overall recovery.  I informed him of the indications, risks, benefits, and alternatives.  He understands the risks include, but not limited to death, MI, DVT, PE, bleeding, possibly for transfusion, infection, prolonged air leak, cardiac arrhythmias, pain issues, as well as possibility of other unforeseeable complications.   Plan: Pulmonary function testing with and without bronchodilators Will discuss options with Dr. Salomon Cree regarding neoadjuvant chemoimmunotherapy or surgery followed by adjuvant therapy.  Zelphia Higashi, MD [facility] [phone]

## 2024-05-14 ENCOUNTER — Ambulatory Visit: Admitting: Student in an Organized Health Care Education/Training Program

## 2024-05-15 ENCOUNTER — Ambulatory Visit (HOSPITAL_COMMUNITY)
Admission: RE | Admit: 2024-05-15 | Discharge: 2024-05-15 | Disposition: A | Discharge status: Home / Self Care | Source: Ambulatory Visit | Attending: Thoracic Surgery (Cardiothoracic Vascular Surgery) | Admitting: Thoracic Surgery (Cardiothoracic Vascular Surgery)

## 2024-05-15 ENCOUNTER — Other Ambulatory Visit: Payer: Self-pay

## 2024-05-15 DIAGNOSIS — R918 Other nonspecific abnormal finding of lung field: Secondary | ICD-10-CM | POA: Diagnosis present

## 2024-05-15 LAB — PULMONARY FUNCTION TEST
DL/VA % pred: 38 %
DL/VA: 1.62 ml/min/mmHg/L
DLCO unc % pred: 34 %
DLCO unc: 8.52 ml/min/mmHg
FEF 25-75 Post: 0.67 L/s
FEF 25-75 Pre: 0.66 L/s
FEF2575-%Change-Post: 1 %
FEF2575-%Pred-Post: 26 %
FEF2575-%Pred-Pre: 26 %
FEV1-%Change-Post: 3 %
FEV1-%Pred-Post: 61 %
FEV1-%Pred-Pre: 59 %
FEV1-Post: 1.93 L
FEV1-Pre: 1.87 L
FEV1FVC-%Change-Post: 7 %
FEV1FVC-%Pred-Pre: 66 %
FEV6-%Change-Post: 0 %
FEV6-%Pred-Post: 82 %
FEV6-%Pred-Pre: 83 %
FEV6-Post: 3.32 L
FEV6-Pre: 3.35 L
FEV6FVC-%Change-Post: 3 %
FEV6FVC-%Pred-Post: 96 %
FEV6FVC-%Pred-Pre: 93 %
FVC-%Change-Post: -3 %
FVC-%Pred-Post: 85 %
FVC-%Pred-Pre: 89 %
FVC-Post: 3.64 L
FVC-Pre: 3.79 L
Post FEV1/FVC ratio: 53 %
Post FEV6/FVC ratio: 91 %
Pre FEV1/FVC ratio: 49 %
Pre FEV6/FVC Ratio: 88 %
RV % pred: 146 %
RV: 3.3 L
TLC % pred: 105 %
TLC: 6.99 L

## 2024-05-15 MED ORDER — ALBUTEROL SULFATE (2.5 MG/3ML) 0.083% IN NEBU
2.5000 mg | INHALATION_SOLUTION | Freq: Once | RESPIRATORY_TRACT | Status: AC
  Administered 2024-05-15: 2.5 mg via RESPIRATORY_TRACT

## 2024-05-15 NOTE — Progress Notes (Signed)
 The proposed treatment discussed in conference is for discussion purpose only and is not a binding recommendation.  The patients have not been physically examined, or presented with their treatment options.  Therefore, final treatment plans cannot be decided.

## 2024-05-20 ENCOUNTER — Encounter (HOSPITAL_COMMUNITY): Payer: Self-pay

## 2024-05-20 ENCOUNTER — Ambulatory Visit (HOSPITAL_COMMUNITY)
Admission: RE | Admit: 2024-05-20 | Discharge: 2024-05-20 | Disposition: A | Discharge status: Home / Self Care | Source: Ambulatory Visit | Attending: Hematology | Admitting: Hematology

## 2024-05-20 DIAGNOSIS — C3490 Malignant neoplasm of unspecified part of unspecified bronchus or lung: Secondary | ICD-10-CM | POA: Diagnosis present

## 2024-05-20 MED ORDER — GADOBUTROL 1 MMOL/ML IV SOLN
7.0000 mL | Freq: Once | INTRAVENOUS | Status: AC | PRN
  Administered 2024-05-20: 7 mL via INTRAVENOUS

## 2024-05-23 ENCOUNTER — Other Ambulatory Visit

## 2024-05-23 ENCOUNTER — Ambulatory Visit: Admitting: Physician Assistant

## 2024-05-23 ENCOUNTER — Ambulatory Visit

## 2024-05-23 ENCOUNTER — Other Ambulatory Visit: Payer: Self-pay

## 2024-05-28 LAB — FUNGUS CULTURE WITH STAIN

## 2024-05-28 LAB — FUNGUS CULTURE RESULT

## 2024-05-28 LAB — FUNGAL ORGANISM REFLEX

## 2024-06-04 ENCOUNTER — Ambulatory Visit: Admitting: Physician Assistant

## 2024-06-04 ENCOUNTER — Other Ambulatory Visit: Payer: Self-pay

## 2024-06-04 ENCOUNTER — Other Ambulatory Visit

## 2024-06-04 DIAGNOSIS — C3491 Malignant neoplasm of unspecified part of right bronchus or lung: Secondary | ICD-10-CM

## 2024-06-04 NOTE — Progress Notes (Signed)
 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  INTERVAL HISTORY:   Andres Fernandez is a 68 y.o. male here for continued evaluation and management of his multiple myeloma and continued Carfilzomib  maintenance and newly diagnosed lung adenocarcinoma.    Patient was last seen by me on 05/07/2024 and reported flu, less than a dime-sized amount of bleeding after his recent biopsy and an episode of blurry vision.   Patient states he is doing well and reports having taken a Pulmonary function test with Dr. Rosamond from 05/15/2024.   He has smoke from age 17 to 89, roughly 50 years 1/2 a day. The most was 3/4 a pack a day.   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   Underlying plasma is well but of importance   He has concerns duration of treatment and of returning to work.    Patient has personal concern for infection following surgery. He is agreeable with the option of surgery.   He denies pain along the back, abdominal pain and leg swelling.  - If surgery is a possibility,   - If surgery is not a possibility,   - possibility of mutation and blood test will completed today for biopsy sample  -stage 2b or possibility of 3a based on number of ... , dependent of characteristics of surgery due to lymph node and more than one spot in the same spot of lung 1-2 lobes may be required to be removed dependent on lung capacity and functionality dependent on follow up with Dr. Rosamond  -tumor is responsive to immunotherapy  - see how tumor whether tumor is aggressive  - Pt would be off of plasma cell leukemia treatment for 1-2 months following surgery to avoid treatment - best option is Chemo immuno up  front and how pt tolerates reevaluate and see if regrown elsewhere in body and to rate radiation and evaluate for surgery  - surgery alone will not be enough to get rid of cancer  -size of primary tumor and number of lymph nodes involved  - if surgery considered, chemo + immunotherapy or chemo + radiation  - if not primarily going into surgery blood test and dna testing to see if targetable mutation is there -chemo immuno first then surgery then followed by treatment  -not surgically removable, chemo + radiation if no receptacle and replaces surgery followed by immunotherapy - Treatment will follow decision - keep cancer suppressed  - if surgery, targeted therapy if mutation or chemo and immuno if no cancer - chemotherapy and radiation will be followed if necessary  - We will follow up with Dr. Rosamond follow up  -blood sample -testing on tumor if needed -genetic testing  -chemo and immuno to shrink cancer and limit amount of surgery and  - targettable mutations, lung ardinal mutations if present and causing cancer multiplication pill can be given to target cancer  - no spread among area  - possibility of surgical removal discussed with Dr. Rosamond  -Discussed lab results on 06/06/2024  in detail with patient. CBC showed WBC of 6.7K, hemoglobin of 12.9, and platelets of 236K.  -CT Scan discussed with Dr. Hendrix -PET scan nodule in right lower long and reoccurring nodule and lymph node -MRI of brain looked well no sign  of cancer   - Revlimid  will be continued  - answered all of patients questions in complete detail and educated patient on guidelines of condition and treatment  REVIEW OF SYSTEMS:    10 Point review of Systems was done is negative except as noted above.   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   SIRS (systemic inflammatory response syndrome) (HCC)    Thrombocytopenia (HCC)    Tobacco abuse     SURGICAL HISTORY: Past Surgical History:  Procedure Laterality Date   APPENDECTOMY     BRONCHOSCOPY, WITH BIOPSY USING ELECTROMAGNETIC NAVIGATION Bilateral 04/29/2024   Procedure: ROBOTIC ASSISTED NAVIGATIONAL BRONCHOSCOPY;  Surgeon: Isadora Hose, MD;  Location: [facility] ORS;  Service: Pulmonary;  Laterality: Bilateral;   ENDOBRONCHIAL ULTRASOUND Bilateral 04/29/2024   Procedure: ENDOBRONCHIAL ULTRASOUND (EBUS);  Surgeon: Isadora Hose, MD;  Location: [facility] OR[facility];  Service: Pulmonary;  Laterality: Bilateral;   FEMUR IM NAIL Right 11/20/2017   Procedure: RIGHT FEMORAL INTRAMEDULLARY (IM) NAIL;  Surgeon: Sharl Selinda Dover, MD;  Location: [facility] OR;  Service: Orthopedics;  Laterality: Right;   IR IMAGING GUIDED PORT INSERTION  08/19/2021   PENILE PROSTHESIS IMPLANT      SOCIAL HISTORY: Social History   Socioeconomic History   Marital status: Single    Spouse name: Not on file   Number of children: Not on file   Years of education: Not on file   Highest education level: Not on file  Occupational History   Not on file  Tobacco Use   Smoking status: Some Days    Current packs/day: 0.25    Average packs/day: 0.3 packs/day for 0.1 years    Types: Cigarettes    Start date: 04/16/2024    Last attempt to quit: 11/09/2017   Smokeless tobacco: Never   Tobacco comments:    Smokes a cigarette every once in a while starting about 2 weeks ago - 04/23/24  Vaping Use   Vaping status: Never Used  Substance and Sexual Activity   Alcohol use: Not Currently   Drug use: Not Currently   Sexual activity: Not on file  Other Topics Concern   Not on file  Social History Narrative   Not on file   Social Drivers of Health   Financial Resource Strain: Not on file   Food Insecurity: No Food Insecurity (04/17/2024)   Hunger Vital Sign    Worried About Running Out of Food in the Last Year: Never true    Ran Out of Food in the Last Year: Never true  Transportation Needs: No Transportation Needs (04/17/2024)   PRAPARE - Administrator, Civil Service (Medical): No    Lack of Transportation (Non-Medical): No  Physical Activity: Not on file  Stress: Not on file  Social Connections: Moderately Integrated (04/17/2024)   Social Connection and Isolation Panel    Frequency of Communication with Friends and Family: More than three times a week    Frequency of Social Gatherings with Friends and Family: Three times a week    Attends Religious Services: More than 4 times per year    Active Member of Clubs or Organizations: Yes  Attends Banker Meetings: More than 4 times per year    Marital Status: Divorced  Intimate Partner Violence: Not At Risk (04/17/2024)   Humiliation, Afraid, Rape, and Kick questionnaire    Fear of Current or Ex-Partner: No    Emotionally Abused: No    Physically Abused: No    Sexually Abused: No    FAMILY HISTORY: Family History  Problem Relation Age of Onset   COPD Mother    Liver cancer Mother     ALLERGIES:  has no known allergies.  MEDICATIONS:  . Current Outpatient Medications on File Prior to Visit  Medication Sig Dispense Refill   acyclovir  (ZOVIRAX ) 400 MG tablet Take 1 tablet (400 mg total) by mouth daily. 90 tablet 6   albuterol  (VENTOLIN  HFA) 108 (90 Base) MCG/ACT inhaler Inhale 2 puffs into the lungs every 6 (six) hours as needed for shortness of breath.     aspirin  EC 81 MG tablet Take 1 tablet (81 mg total) by mouth daily. 60 tablet 11   ferrous sulfate  325 (65 FE) MG EC tablet TAKE 1 TABLET BY MOUTH EVERY DAY WITH BREAKFAST (Patient not taking: No sig reported) 90 tablet 1   Fluticasone Propionate , Inhal, (FLUTICASONE PROPIONATE  DISKUS) 250 MCG/ACT AEPB TAKE 1 PUFF BY MOUTH EVERY DAY (Patient  not taking: No sig reported) 60 each 1   [Paused] lenalidomide  (REVLIMID ) 10 MG capsule Take 1 capsule (10 mg total) by mouth daily. Take 1 capsule (10 mg total) by mouth daily for 21 days. Take 7 days off. Repeat. 21 capsule 0   lidocaine -prilocaine  (EMLA ) cream Apply 1 Application topically as needed. Apply to Port-A-Cath at least 1 hr prior to treatment. 30 g 0   Multiple Vitamins-Minerals (MULTIVITAMIN WITH MINERALS) tablet Take 1 tablet by mouth at bedtime.     nicotine  (NICODERM CQ  - DOSED IN MG/24 HOURS) 14 mg/24hr patch PLACE 1 PATCH (14 MG TOTAL) ONTO THE SKIN DAILY. (Patient not taking: Reported on 04/17/2024) 28 patch 1   Spacer/Aero-Holding Chambers (AEROCHAMBER MV) inhaler Use as instructed 1 each 0   Vitamin D , Ergocalciferol , (DRISDOL ) 1.25 MG (50000 UNIT) CAPS capsule Take 1 capsule (50,000 Units total) by mouth once a week. (Patient not taking: Reported on 04/17/2024) 12 capsule 6   Current Facility-Administered Medications on File Prior to Visit  Medication Dose Route Frequency Provider Last Rate Last Admin   acetaminophen  (TYLENOL ) 325 MG tablet            famotidine  (PEPCID ) 20 MG tablet             PHYSICAL EXAMINATION:  ECOG FS:1 - Symptomatic but completely ambulatory .There were no vitals taken for this visit.  Wt Readings from Last 3 Encounters:  05/13/24 152 lb (68.9 kg)  05/07/24 152 lb 12.8 oz (69.3 kg)  04/25/24 145 lb (65.8 kg)    GENERAL:alert, in no acute distress and comfortable SKIN: no acute rashes, no significant lesions EYES: conjunctiva are pink and non-injected, sclera anicteric OROPHARYNX: MMM, no exudates, no oropharyngeal erythema or ulceration NECK: supple, no JVD LYMPH:  no palpable lymphadenopathy in the cervical, axillary or inguinal regions LUNGS: clear to auscultation b/l with normal respiratory effort HEART: regular rate & rhythm ABDOMEN:  normoactive bowel sounds , non tender, not distended. Extremity: no pedal edema PSYCH: alert &  oriented x 3 with fluent speech NEURO: no focal motor/sensory deficits   LABORATORY DATA:  I have reviewed the data as listed     Latest Ref Rng &  Units 04/17/2024   12:18 PM 04/11/2024    8:52 AM 03/28/2024    8:56 AM  CBC  WBC 4.0 - 10.5 K/uL 4.1  7.3  10.6   Hemoglobin 13.0 - 17.0 g/dL 86.3  86.8  87.0   Hematocrit 39.0 - 52.0 % 41.8  37.6  37.4   Platelets 150 - 400 K/uL 136  377  311    CBC    Component Value Date/Time   WBC 4.1 04/17/2024 1218   RBC 4.64 04/17/2024 1218   HGB 13.6 04/17/2024 1218   HGB 13.1 04/11/2024 0852   HGB 8.3 (L) 12/14/2017 1230   HCT 41.8 04/17/2024 1218   HCT 23.9 (L) 12/14/2017 1230   PLT 136 (L) 04/17/2024 1218   PLT 377 04/11/2024 0852   PLT 524 (H) 12/14/2017 1230   MCV 90.1 04/17/2024 1218   MCV 88.4 12/14/2017 1230   MCH 29.3 04/17/2024 1218   MCHC 32.5 04/17/2024 1218   RDW 15.1 04/17/2024 1218   RDW 15.3 (H) 12/14/2017 1230   LYMPHSABS 1.4 04/17/2024 1218   LYMPHSABS 1.4 12/14/2017 1230   MONOABS 0.9 04/17/2024 1218   MONOABS 0.6 12/14/2017 1230   EOSABS 0.0 04/17/2024 1218   EOSABS 0.1 12/14/2017 1230   BASOSABS 0.0 04/17/2024 1218   BASOSABS 0.1 12/14/2017 1230        Latest Ref Rng & Units 04/18/2024    3:45 AM 04/17/2024   12:18 PM 04/11/2024    8:52 AM  CMP  Glucose 70 - 99 mg/dL 83  94  94   BUN 8 - 23 mg/dL 31  33  21   Creatinine 0.61 - 1.24 mg/dL 8.80  8.00  8.55   Sodium 135 - 145 mmol/L 137  138  140   Potassium 3.5 - 5.1 mmol/L 4.5  4.4  4.1   Chloride 98 - 111 mmol/L 108  105  107   CO2 22 - 32 mmol/L 18  21  25    Calcium 8.9 - 10.3 mg/dL 8.0  8.2  9.0   Total Protein 6.5 - 8.1 g/dL  6.9  7.1   Total Bilirubin 0.0 - 1.2 mg/dL  0.6  0.4   Alkaline Phos 38 - 126 U/L  49  70   AST 15 - 41 U/L  24  10   ALT 0 - 44 U/L  17  11     04/29/2024 Cytopathology report:  Fine needle Aspiration, RLL mass - Positive for malignancy, adenocarcinoma Bronchial Brushing, RLL - Positive for malignancy, adenocarcinoma Fine  needle aspiration, lymph node station 11L negative for malignancy Fine needle aspiration lymph node 7, station 7, negative for malignancy  03/09/20 ([id number]) Bone Marrow Biopsy at [facility]   07/02/2019 Bone Marrow Biopsy ([id number]) at [facility]    08/07/18 BM Bx:         RADIOGRAPHIC STUDIES: I have personally reviewed the radiological images as listed and agreed with the findings in the report. MR Brain W Wo Contrast Result Date: 05/21/2024 CLINICAL DATA:  Provided history: Non-small cell lung cancer, staging. Initial staging of newly diagnosed lung adenocarcinoma. EXAM: MRI HEAD WITHOUT AND WITH CONTRAST TECHNIQUE: Multiplanar, multiecho pulse sequences of the brain and surrounding structures were obtained without and with intravenous contrast. CONTRAST:  7mL GADAVIST  GADOBUTROL  1 MMOL/ML IV SOLN COMPARISON:  Head CT 11/17/2017. FINDINGS: Brain: No age-advanced or lobar predominant cerebral atrophy. Punctate chronic microhemorrhage within the anterior right frontal lobe. No cortical encephalomalacia is identified. No  significant cerebral white matter disease. There is no acute infarct. No evidence of an intracranial mass. No extra-axial fluid collection. No midline shift. No pathologic intracranial enhancement identified. Vascular: Maintained flow voids within the proximal large arterial vessels. Skull and upper cervical spine: No enhancing osseous lesion to suggest osseous metastatic disease. Sinuses/Orbits: No mass or acute finding within the imaged orbits. Mild mucosal thickening versus small-volume secretions within the right maxillary sinus. Minimal mucosal thickening within the left maxillary sinus. Trace mucosal thickening within the bilateral ethmoid and frontal sinuses. IMPRESSION: 1. No evidence of intracranial metastatic disease. 2. Mild paranasal sinus disease. Electronically Signed   By: Rockey Childs D.O.   On: 05/21/2024 07:58   ASSESSMENT & PLAN:    68  y.o. 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   Initial BM Bx showed >95% involvement with kappa restricted serum free light chains. 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 MoL Cy-   08/07/18 BM Bx revealed normocellular bone marrow with trilineage hematopoiesis and 1% plasma cells which indicates the pt is in remission 08/06/18 PET/CT revealed  Expansile lytic lesion involving the left iliac bone, unchanged. Lytic lesion involving the sternum, unchanged. While both are suspicious for myeloma, neither lesion is FDG avid. No hypermetabolic osseous lesions in the visualized axial and appendicular skeleton. Please note that the bilateral lower extremities were not imaged. No suspicious lymphadenopathy. Spleen is normal in size.    Pt began Ninlaro  3mg  maintenance after pt finished 13 cycles of CyBorD and BM bx showed only 1% plasma cells, then resumed CyBorD and held Ninlaro  in anticipation of his tandem bone marrow transplants beginning in late December 2019 with Dr. Rogue Stallion at [facility]   11/19/18 High dose chemotherapy with Melphalan 140mg /m2 and autologous stem cell rescue  03/20/19 High dose chemotherapy with Melphalan 200mg /m2 and autologous stem cell rescue. Non-infectious diarrhea, uncomplicated course otherwise.  07/02/2019 PET scan with results revealing 1. No FDG avid bone lesions are identified. 2.  Asymmetric uptake within the right, greater than left, palatine tonsils with mild fullness of the right tonsillar soft tissue. ENT consult vs attention on follow up suggested. 3.  Innumerable mixed sclerotic and lytic lesions throughout the axial and appendicular skeleton without associated  hypermetabolic activity. 4.  Ancillary CT findings as above.  07/02/2019 bone marrow biopsy ([id number])with results showing: Trilineage hematopoieses with maturation. No plasma cell myeloma identified. Mild anemia and No Rouleaux or circulating plasma cells identified in the peripheral blood.  2) s/p Prophylactic IM nailing for rt femur  completed [facility] at the [facility]  -patient previously reported improvement and is currently progressively back to full time work.  3) Thrombocytopenia resolved   4) Renal insufficiency --likely related to multiple myeloma.  Remained fairly stable with baseline creatinine of 1.5-2.1.  Creatinine is 1.44 today Plan -maintain follow up with nephrology referral to Dr Rayburn [facility]ice [facility] for continued treatment -Pt completed 8 weeks of maverick for hep c with Dr Efrain on 07/08/18.  -Kidney numbers improved. Creatinine in the 1.7-2.2 range   5) h/o tobacco abuse-counseled on smoking cessation -Currently not smoking.   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 Hemoglobin is stable at 12.8  8) Newly diagnosed lung adenocarcinoma  PLAN:  -discussed that sometimes after having a flu, there can be a secondary bacterial infection, called bacterial pneumonia -Patient reports that he has returned to baseline at this time from a respiratory infection standpoint -04/11/2024 PET scan showed: 1. Hypermetabolic spiculated right lower lobe pulmonary nodule has enlarged from previous chest CT, consistent with primary bronchogenic carcinoma. There is a satellite nodule and hypermetabolic right hilar lymph node, suspicious for metastatic disease. 2. Potential new small right upper and lower lobe nodules, indeterminate. 3. No evidence of hypermetabolic metastatic disease in the abdomen or pelvis. 4. Focal hypermetabolic activity within the left mid abdomen corresponding  with small-bowel loops, favored to be physiologic. 5. Multiple chronic lytic lesions throughout the axial and appendicular skeleton consistent with underlying multiple myeloma, without associated hypermetabolic activity. No evidence of hypermetabolic osseous metastatic disease. 6. Aortic Atherosclerosis (ICD10-I70.0) and Emphysema (ICD10-J43.9). -discussed that his bronchoscopy biopsy report 04/29/2024 showed primary lung cancer unrelated to myeloma. -Discussed recent pathology results which showed findings of adenocarcinoma. Fine needle Aspiration, RLL mass and Bronchial Brushing, RLL were positive for malignancy, adenocarcinoma. He had two lymph nodes biopsied which did not show obvious malignancy.  -educated patient that there are different types of lung cancers, including small cell lung cancer, which tends to be much faster growing, and non-small cell lung cancer, which initially tends to grow locally before spreading beyond.  -discussed that patient has non-small cell lung cancer, specifically adenocarcinoma.  -he appears to have stage 1-2 lung cancer -Will order Brain MRI to finish staging -discussed that standard treatment for his adenocarcinoma would be to see if there can be surgical removal for best chance of cure. Discussed that if this is possible, and if his remaining lung capacity is adequate, he may need to have removal of part of his lung, though the final decision in this matter will be made by his surgeon.  -will place urgent referral to cardiothoracic surgeon, Dr, Kerrin, to consider surgical removal of newly diagnosed lung adenocarcinoma -Discussed that there can sometimes be a role for localized radiation therapy or other treatment after surgery to avoid non-small cell lung cancer recurrence.  -discussed that depending on the molecular subtype of lung adenocarcinoma, after surgery, the disease can be driven by specific genetic mutations and can be targeted with targeted  therapies.  -discussed that there will be more genetic testing based on final pathology results.  -will order lung function testing -he continues to be remission from a myeloma standpoint -will hold myeloma treatment for one month   FOLLOW-UP: ***  The total time spent in the appointment was *** minutes* .  All of the patient's questions were answered with apparent satisfaction. The patient knows to call the clinic with any problems, questions or concerns.   Emaline Saran MD MS AAHIVMS [facility] Hematology/Oncology Physician [facility]  .*Total Encounter Time as defined by the Centers for Medicare and Medicaid Services includes, in addition to the face-to-face time of a patient visit (documented in the note above) non-face-to-face time: obtaining and reviewing outside history, ordering and reviewing medications, tests or procedures, care coordination (communications with other health care professionals or caregivers) and documentation in the medical record.   I,Mitra Faeizi,acting as a Neurosurgeon for Emaline Saran, MD.,have documented all relevant documentation on the behalf of Emaline Saran, MD,as directed by  Emaline Saran, MD while in the presence of Emaline Saran, MD.  ***

## 2024-06-06 ENCOUNTER — Inpatient Hospital Stay: Attending: Hematology

## 2024-06-06 ENCOUNTER — Other Ambulatory Visit: Payer: Self-pay

## 2024-06-06 ENCOUNTER — Inpatient Hospital Stay

## 2024-06-06 ENCOUNTER — Inpatient Hospital Stay: Attending: Hematology | Admitting: Hematology

## 2024-06-06 VITALS — BP 157/95 | HR 93 | Temp 98.1°F | Resp 18 | Wt 151.4 lb

## 2024-06-06 DIAGNOSIS — C3491 Malignant neoplasm of unspecified part of right bronchus or lung: Secondary | ICD-10-CM

## 2024-06-06 DIAGNOSIS — D630 Anemia in neoplastic disease: Secondary | ICD-10-CM | POA: Diagnosis not present

## 2024-06-06 DIAGNOSIS — Z9221 Personal history of antineoplastic chemotherapy: Secondary | ICD-10-CM | POA: Insufficient documentation

## 2024-06-06 DIAGNOSIS — C9001 Multiple myeloma in remission: Secondary | ICD-10-CM | POA: Diagnosis present

## 2024-06-06 DIAGNOSIS — C9011 Plasma cell leukemia in remission: Secondary | ICD-10-CM | POA: Insufficient documentation

## 2024-06-06 DIAGNOSIS — Z87891 Personal history of nicotine dependence: Secondary | ICD-10-CM | POA: Diagnosis not present

## 2024-06-06 DIAGNOSIS — Z800 Family history of malignant neoplasm of digestive organs: Secondary | ICD-10-CM | POA: Diagnosis not present

## 2024-06-06 DIAGNOSIS — N289 Disorder of kidney and ureter, unspecified: Secondary | ICD-10-CM | POA: Insufficient documentation

## 2024-06-06 DIAGNOSIS — M84551A Pathological fracture in neoplastic disease, right femur, initial encounter for fracture: Secondary | ICD-10-CM

## 2024-06-06 DIAGNOSIS — Z7189 Other specified counseling: Secondary | ICD-10-CM

## 2024-06-06 DIAGNOSIS — C9010 Plasma cell leukemia not having achieved remission: Secondary | ICD-10-CM

## 2024-06-06 DIAGNOSIS — Z95828 Presence of other vascular implants and grafts: Secondary | ICD-10-CM

## 2024-06-06 DIAGNOSIS — C3431 Malignant neoplasm of lower lobe, right bronchus or lung: Secondary | ICD-10-CM | POA: Insufficient documentation

## 2024-06-06 LAB — CBC WITH DIFFERENTIAL (CANCER CENTER ONLY)
Abs Immature Granulocytes: 0.02 10*3/uL (ref 0.00–0.07)
Basophils Absolute: 0.1 10*3/uL (ref 0.0–0.1)
Basophils Relative: 1 %
Eosinophils Absolute: 0.1 10*3/uL (ref 0.0–0.5)
Eosinophils Relative: 1 %
HCT: 37.8 % — ABNORMAL LOW (ref 39.0–52.0)
Hemoglobin: 12.9 g/dL — ABNORMAL LOW (ref 13.0–17.0)
Immature Granulocytes: 0 %
Lymphocytes Relative: 24 %
Lymphs Abs: 1.6 10*3/uL (ref 0.7–4.0)
MCH: 30.6 pg (ref 26.0–34.0)
MCHC: 34.1 g/dL (ref 30.0–36.0)
MCV: 89.8 fL (ref 80.0–100.0)
Monocytes Absolute: 0.7 10*3/uL (ref 0.1–1.0)
Monocytes Relative: 10 %
Neutro Abs: 4.3 10*3/uL (ref 1.7–7.7)
Neutrophils Relative %: 64 %
Platelet Count: 236 10*3/uL (ref 150–400)
RBC: 4.21 MIL/uL — ABNORMAL LOW (ref 4.22–5.81)
RDW: 17.4 % — ABNORMAL HIGH (ref 11.5–15.5)
WBC Count: 6.7 10*3/uL (ref 4.0–10.5)
nRBC: 0 % (ref 0.0–0.2)

## 2024-06-06 LAB — CMP (CANCER CENTER ONLY)
ALT: 7 U/L (ref 0–44)
AST: 9 U/L — ABNORMAL LOW (ref 15–41)
Albumin: 4.2 g/dL (ref 3.5–5.0)
Alkaline Phosphatase: 60 U/L (ref 38–126)
Anion gap: 6 (ref 5–15)
BUN: 21 mg/dL (ref 8–23)
CO2: 24 mmol/L (ref 22–32)
Calcium: 9.2 mg/dL (ref 8.9–10.3)
Chloride: 110 mmol/L (ref 98–111)
Creatinine: 1.32 mg/dL — ABNORMAL HIGH (ref 0.61–1.24)
GFR, Estimated: 59 mL/min — ABNORMAL LOW (ref >60)
Glucose, Bld: 97 mg/dL (ref 70–99)
Potassium: 4.1 mmol/L (ref 3.5–5.1)
Sodium: 140 mmol/L (ref 135–145)
Total Bilirubin: 0.5 mg/dL (ref 0.0–1.2)
Total Protein: 7.2 g/dL (ref 6.5–8.1)

## 2024-06-06 MED ORDER — SODIUM CHLORIDE 0.9% FLUSH
10.0000 mL | Freq: Once | INTRAVENOUS | Status: AC
  Administered 2024-06-06: 10 mL

## 2024-06-06 MED ORDER — SODIUM CHLORIDE 0.9% FLUSH
3.0000 mL | Freq: Once | INTRAVENOUS | Status: DC | PRN
Start: 2024-06-06 — End: 2024-06-06

## 2024-06-06 MED ORDER — HEPARIN SOD (PORK) LOCK FLUSH 100 UNIT/ML IV SOLN
500.0000 [IU] | Freq: Once | INTRAVENOUS | Status: AC | PRN
  Administered 2024-06-06: 500 [IU]

## 2024-06-09 LAB — KAPPA/LAMBDA LIGHT CHAINS
Kappa free light chain: 15.2 mg/L (ref 3.3–19.4)
Kappa, lambda light chain ratio: 0.92 (ref 0.26–1.65)
Lambda free light chains: 16.5 mg/L (ref 5.7–26.3)

## 2024-06-09 LAB — MULTIPLE MYELOMA PANEL, SERUM
Albumin SerPl Elph-Mcnc: 3.8 g/dL (ref 2.9–4.4)
Albumin/Glob SerPl: 1.3 (ref 0.7–1.7)
Alpha 1: 0.2 g/dL (ref 0.0–0.4)
Alpha2 Glob SerPl Elph-Mcnc: 1 g/dL (ref 0.4–1.0)
B-Globulin SerPl Elph-Mcnc: 0.9 g/dL (ref 0.7–1.3)
Gamma Glob SerPl Elph-Mcnc: 0.8 g/dL (ref 0.4–1.8)
Globulin, Total: 3 g/dL (ref 2.2–3.9)
IgA: 85 mg/dL (ref 61–437)
IgG (Immunoglobin G), Serum: 1072 mg/dL (ref 603–1613)
IgM (Immunoglobulin M), Srm: 6 mg/dL — ABNORMAL LOW (ref 20–172)
M Protein SerPl Elph-Mcnc: 0.3 g/dL — ABNORMAL HIGH
Total Protein ELP: 6.8 g/dL (ref 6.0–8.5)

## 2024-06-10 ENCOUNTER — Other Ambulatory Visit: Payer: Self-pay

## 2024-06-12 ENCOUNTER — Encounter: Payer: Self-pay | Admitting: Hematology

## 2024-06-12 ENCOUNTER — Other Ambulatory Visit: Payer: Self-pay

## 2024-06-12 LAB — ACID FAST CULTURE WITH REFLEXED SENSITIVITIES (MYCOBACTERIA): Acid Fast Culture: NEGATIVE

## 2024-06-15 ENCOUNTER — Other Ambulatory Visit: Payer: Self-pay

## 2024-06-16 LAB — GUARDANT 360

## 2024-06-25 ENCOUNTER — Other Ambulatory Visit: Payer: Self-pay

## 2024-06-25 NOTE — Progress Notes (Signed)
 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  INTERVAL HISTORY:   Andres Fernandez is a 68 y.o. male here for continued evaluation and management of his multiple myeloma and continued Carfilzomib  maintenance and newly diagnosed lung adenocarcinoma.    Patient was last seen by me on 06/06/2024.  He reports cramping in his neck muscle.   He reports that he is currently smoking.   He denies any back pain, abdominal pain, or leg swelling. Patient is eating well and staying well-hydrated.   He reports that he is able to take some time off of work if needed from a fatigue standpoint.   REVIEW OF SYSTEMS:    10 Point review of Systems was done is negative except as noted above.   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   SIRS (systemic inflammatory response syndrome) (HCC)    Thrombocytopenia (HCC)    Tobacco abuse     SURGICAL HISTORY: Past Surgical History:  Procedure Laterality Date   APPENDECTOMY     BRONCHOSCOPY, WITH BIOPSY USING ELECTROMAGNETIC NAVIGATION Bilateral 04/29/2024   Procedure: ROBOTIC ASSISTED NAVIGATIONAL BRONCHOSCOPY;  Surgeon: Isadora Hose, MD;  Location: [facility] OR[facility];  Service: Pulmonary;  Laterality: Bilateral;   ENDOBRONCHIAL ULTRASOUND Bilateral 04/29/2024   Procedure: ENDOBRONCHIAL ULTRASOUND (EBUS);  Surgeon: Isadora Hose, MD;  Location: [facility];  Service: Pulmonary;  Laterality:  Bilateral;   FEMUR IM NAIL Right 11/20/2017   Procedure: RIGHT FEMORAL INTRAMEDULLARY (IM) NAIL;  Surgeon: Sharl Selinda Dover, MD;  Location: [facility];  Service: Orthopedics;  Laterality: Right;   IR IMAGING GUIDED PORT INSERTION  08/19/2021   PENILE PROSTHESIS IMPLANT      SOCIAL HISTORY: Social History   Socioeconomic History   Marital status: Single    Spouse name: Not on file   Number of children: Not on file   Years of education: Not on file   Highest education level: Not on file  Occupational History   Not on file  Tobacco Use   Smoking status: Some Days    Current packs/day: 0.25    Average packs/day: 0.3 packs/day for 0.2 years    Types: Cigarettes    Start date: 04/16/2024    Last attempt to quit: 11/09/2017   Smokeless tobacco: Never   Tobacco comments:    Smokes a cigarette every once in a while starting about 2 weeks ago - 04/23/24  Vaping Use   Vaping status: Never Used  Substance and Sexual Activity   Alcohol use: Not Currently   Drug use: Not Currently   Sexual activity: Not on file  Other Topics Concern   Not on file  Social History Narrative   Not on file   Social Drivers of Health   Financial Resource Strain: Not on file  Food Insecurity: No Food Insecurity (04/17/2024)   Hunger Vital Sign    Worried About Running Out of Food in the Last Year: Never true  Ran Out of Food in the Last Year: Never true  Transportation Needs: No Transportation Needs (04/17/2024)   PRAPARE - Administrator, Civil Service (Medical): No    Lack of Transportation (Non-Medical): No  Physical Activity: Not on file  Stress: Not on file  Social Connections: Moderately Integrated (04/17/2024)   Social Connection and Isolation Panel    Frequency of Communication with Friends and Family: More than three times a week    Frequency of Social Gatherings with Friends and Family: Three times a week    Attends Religious Services: More than 4 times per year    Active Member  of Clubs or Organizations: Yes    Attends Banker Meetings: More than 4 times per year    Marital Status: Divorced  Intimate Partner Violence: Not At Risk (04/17/2024)   Humiliation, Afraid, Rape, and Kick questionnaire    Fear of Current or Ex-Partner: No    Emotionally Abused: No    Physically Abused: No    Sexually Abused: No    FAMILY HISTORY: Family History  Problem Relation Age of Onset   COPD Mother    Liver cancer Mother     ALLERGIES:  has no known allergies.  MEDICATIONS:  . Current Outpatient Medications on File Prior to Visit  Medication Sig Dispense Refill   acyclovir  (ZOVIRAX ) 400 MG tablet Take 1 tablet (400 mg total) by mouth daily. 90 tablet 6   albuterol  (VENTOLIN  HFA) 108 (90 Base) MCG/ACT inhaler Inhale 2 puffs into the lungs every 6 (six) hours as needed for shortness of breath.     aspirin  EC 81 MG tablet Take 1 tablet (81 mg total) by mouth daily. 60 tablet 11   ferrous sulfate  325 (65 FE) MG EC tablet TAKE 1 TABLET BY MOUTH EVERY DAY WITH BREAKFAST (Patient not taking: No sig reported) 90 tablet 1   Fluticasone Propionate , Inhal, (FLUTICASONE PROPIONATE  DISKUS) 250 MCG/ACT AEPB TAKE 1 PUFF BY MOUTH EVERY DAY (Patient not taking: No sig reported) 60 each 1   [Paused] lenalidomide  (REVLIMID ) 10 MG capsule Take 1 capsule (10 mg total) by mouth daily. Take 1 capsule (10 mg total) by mouth daily for 21 days. Take 7 days off. Repeat. 21 capsule 0   lidocaine -prilocaine  (EMLA ) cream Apply 1 Application topically as needed. Apply to Port-A-Cath at least 1 hr prior to treatment. 30 g 0   Multiple Vitamins-Minerals (MULTIVITAMIN WITH MINERALS) tablet Take 1 tablet by mouth at bedtime.     nicotine  (NICODERM CQ  - DOSED IN MG/24 HOURS) 14 mg/24hr patch PLACE 1 PATCH (14 MG TOTAL) ONTO THE SKIN DAILY. (Patient not taking: Reported on 04/17/2024) 28 patch 1   Spacer/Aero-Holding Chambers (AEROCHAMBER MV) inhaler Use as instructed 1 each 0   Vitamin D ,  Ergocalciferol , (DRISDOL ) 1.25 MG (50000 UNIT) CAPS capsule Take 1 capsule (50,000 Units total) by mouth once a week. (Patient not taking: Reported on 04/17/2024) 12 capsule 6   Current Facility-Administered Medications on File Prior to Visit  Medication Dose Route Frequency Provider Last Rate Last Admin   acetaminophen  (TYLENOL ) 325 MG tablet            famotidine  (PEPCID ) 20 MG tablet             PHYSICAL EXAMINATION:  ECOG FS:1 - Symptomatic but completely ambulatory .BP (!) 138/99   Pulse 81   Temp (!) 97.5 F (36.4 C)   Resp 20   Wt 154 lb 3.2 oz (69.9 kg)  SpO2 99%   BMI 23.45 kg/m   Wt Readings from Last 3 Encounters:  06/06/24 151 lb 6.4 oz (68.7 kg)  05/13/24 152 lb (68.9 kg)  05/07/24 152 lb 12.8 oz (69.3 kg)    GENERAL:alert, in no acute distress and comfortable SKIN: no acute rashes, no significant lesions EYES: conjunctiva are pink and non-injected, sclera anicteric OROPHARYNX: MMM, no exudates, no oropharyngeal erythema or ulceration NECK: supple, no JVD LYMPH:  no palpable lymphadenopathy in the cervical, axillary or inguinal regions LUNGS: clear to auscultation b/l with normal respiratory effort HEART: regular rate & rhythm ABDOMEN:  normoactive bowel sounds , non tender, not distended. Extremity: no pedal edema PSYCH: alert & oriented x 3 with fluent speech NEURO: no focal motor/sensory deficits    LABORATORY DATA:  I have reviewed the data as listed     Latest Ref Rng & Units 06/06/2024    9:30 AM 04/17/2024   12:18 PM 04/11/2024    8:52 AM  CBC  WBC 4.0 - 10.5 K/uL 6.7  4.1  7.3   Hemoglobin 13.0 - 17.0 g/dL 87.0  86.3  86.8   Hematocrit 39.0 - 52.0 % 37.8  41.8  37.6   Platelets 150 - 400 K/uL 236  136  377    CBC    Component Value Date/Time   WBC 6.7 06/06/2024 0930   WBC 4.1 04/17/2024 1218   RBC 4.21 (L) 06/06/2024 0930   HGB 12.9 (L) 06/06/2024 0930   HGB 8.3 (L) 12/14/2017 1230   HCT 37.8 (L) 06/06/2024 0930   HCT 23.9 (L) 12/14/2017  1230   PLT 236 06/06/2024 0930   PLT 524 (H) 12/14/2017 1230   MCV 89.8 06/06/2024 0930   MCV 88.4 12/14/2017 1230   MCH 30.6 06/06/2024 0930   MCHC 34.1 06/06/2024 0930   RDW 17.4 (H) 06/06/2024 0930   RDW 15.3 (H) 12/14/2017 1230   LYMPHSABS 1.6 06/06/2024 0930   LYMPHSABS 1.4 12/14/2017 1230   MONOABS 0.7 06/06/2024 0930   MONOABS 0.6 12/14/2017 1230   EOSABS 0.1 06/06/2024 0930   EOSABS 0.1 12/14/2017 1230   BASOSABS 0.1 06/06/2024 0930   BASOSABS 0.1 12/14/2017 1230        Latest Ref Rng & Units 06/06/2024    9:30 AM 04/18/2024    3:45 AM 04/17/2024   12:18 PM  CMP  Glucose 70 - 99 mg/dL 97  83  94   BUN 8 - 23 mg/dL 21  31  33   Creatinine 0.61 - 1.24 mg/dL 8.67  8.80  8.00   Sodium 135 - 145 mmol/L 140  137  138   Potassium 3.5 - 5.1 mmol/L 4.1  4.5  4.4   Chloride 98 - 111 mmol/L 110  108  105   CO2 22 - 32 mmol/L 24  18  21    Calcium 8.9 - 10.3 mg/dL 9.2  8.0  8.2   Total Protein 6.5 - 8.1 g/dL 7.2   6.9   Total Bilirubin 0.0 - 1.2 mg/dL 0.5   0.6   Alkaline Phos 38 - 126 U/L 60   49   AST 15 - 41 U/L 9   24   ALT 0 - 44 U/L 7   17     Guardant 360:          04/29/2024 Cytopathology report:  Fine needle Aspiration, RLL mass - Positive for malignancy, adenocarcinoma Bronchial Brushing, RLL - Positive for malignancy, adenocarcinoma Fine needle aspiration, lymph node  station 11L negative for malignancy Fine needle aspiration lymph node 7, station 7, negative for malignancy  03/09/20 [phone]) [facility]opsy at [facility]   07/02/2019 Bone Marrow Biopsy ([id number]) at [facility]    08/07/18 BM Bx:         RADIOGRAPHIC STUDIES: I have personally reviewed the radiological images as listed and agreed with the findings in the report. No results found.  ASSESSMENT & PLAN:    68 y.o. 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   Initial BM Bx showed >95% involvement with kappa restricted serum free light chains. 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 MoL Cy-   08/07/18 BM Bx revealed normocellular bone marrow with trilineage hematopoiesis and 1% plasma cells which indicates the pt is in remission 08/06/18 PET/CT revealed  Expansile lytic lesion involving the left iliac bone, unchanged. Lytic lesion involving the sternum, unchanged. While both are suspicious for myeloma, neither lesion is FDG avid. No hypermetabolic osseous lesions in the visualized axial and appendicular skeleton. Please note that the bilateral lower extremities were not imaged. No suspicious lymphadenopathy. Spleen is normal in size.    Pt began Ninlaro  3mg  maintenance after pt finished 13 cycles of CyBorD and BM bx showed only 1% plasma cells, then resumed CyBorD and held Ninlaro  in anticipation of his tandem bone marrow transplants beginning in late December 2019 with Dr. Rogue Stallion at [facility]   11/19/18 High dose chemotherapy with Melphalan 140mg /m2 and autologous stem cell rescue  03/20/19 High dose chemotherapy with Melphalan 200mg /m2 and autologous stem cell rescue. Non-infectious diarrhea, uncomplicated course otherwise.  07/02/2019 PET scan with results revealing 1. No FDG avid bone lesions are identified. 2.  Asymmetric uptake within the right, greater than left, palatine tonsils with mild fullness of the right tonsillar soft tissue. ENT consult vs attention on follow up suggested. 3.  Innumerable mixed sclerotic and lytic lesions throughout the axial and appendicular skeleton without associated hypermetabolic activity. 4.  Ancillary CT findings as above.  07/02/2019 bone marrow biopsy ([id number])with results showing: Trilineage  hematopoieses with maturation. No plasma cell myeloma identified. Mild anemia and No Rouleaux or circulating plasma cells identified in the peripheral blood.  2) s/p Prophylactic IM nailing for rt femur  completed Physical Therapy at [facility].  -patient previously reported improvement and is currently progressively back to full time work.  3) Thrombocytopenia resolved   4) Renal insufficiency --likely related to multiple myeloma.  Remained fairly stable with baseline creatinine of 1.5-2.1.  Creatinine is 1.44 today Plan -maintain follow up with nephrology referral to Dr Rayburn [facility] for continued treatment -Pt completed 8 weeks of maverick for hep c with Dr Efrain on 07/08/18.  -Kidney numbers improved. Creatinine in the 1.7-2.2 range   5) h/o tobacco abuse-counseled on smoking cessation -Currently not smoking.   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 Hemoglobin is stable at 12.8  8) Newly diagnosed lung adenocarcinoma  PLAN:  -Guardant 360 test results unfortunately shows that he does not have any of the 8 targetable mutations to allow for targeted therapy -myeloma labs show that his light chains are  completely suppressed. We discussed that his myeloma is primarily making kappa light chains and kappa free light chains remain normal at 15.2 mg/L. Discussed that his myeloma panel shows a small amount of M protein at 0.3 g/dL, which is likely the body's reaction to a solid tumor related to lung cancer and less likely to suggest myeloma progression, though this would need to be monitored.  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  -did not feel any enlarged lymph nodes during physical examination -recommend to proceed with chemoimmunotherapy to treat his lung cancer at this point for at least 3-4 cycles  and then evaluate his response.  -would not recommend surgery or radiation upfront, since it may not address aggressive element and disease can later present in other areas. Also discussed concern that with surgery, there would be a role to hold myeloma treatment for several months with longer rehab period.  -discussed that if his chemoimmunotherapy is quite effective, at that point, we shall decide based on myeloma status and response to treatment for lung cancer, whether to proceed with surgery or radiation. We discussed that there may also still be immunotherapy for lung cancer after that.  -we will continue Revlimid  maintenance for myeloma at this time. -we will hold carfilzomab at this time for myeloma.  -given that his risk profile has changed for blood clots given his lung cancer, we will switch aspirin  to eliquis  to provide more protection in regards to reducing the risk of blood clots -discussed that treatment does suppress his immune system -discussed that there will be a role for a shot to boost WBCs to reduce the chance of infections -recommend taking infection precautions including avoiding too much social contact with crowds -start B12 shots to reduce the risk of mouth sores -start folic acid -recommend physical activity at level of walking briskly for 30 minutes a day  -discussed that it would be reasonable to take some time off of work if needed from a fatigue standpoint -patient is agreeable to starting treatment in 1 week -will plan to start treatment in 1 week -answered all of patient's questions in detail  FOLLOW-UP: Carbo/pepm/pem neoadjuvant chemotherapy to start ASAP Portflush/labs and MD visit with C1D14 for toxicitiy check  The total time spent in the appointment was 40 minutes* .  All of the patient's questions were answered with apparent satisfaction. The patient knows to call the clinic with any problems, questions or concerns.   Emaline Saran MD MS AAHIVMS [facility]  [facility] Hematology/Oncology Physician [facility]  .*Total Encounter Time as defined by the Centers for Medicare and Medicaid Services includes, in addition to the face-to-face time of a patient visit (documented in the note above) non-face-to-face time: obtaining and reviewing outside history, ordering and reviewing medications, tests or procedures, care coordination (communications with other health care professionals or caregivers) and documentation in the medical record.    I,Mitra Faeizi,acting as a Neurosurgeon for Emaline Saran, MD.,have documented all relevant documentation on the behalf of Emaline Saran, MD,as directed by  Emaline Saran, MD while in the presence of Emaline Saran, MD.  .I have reviewed the above documentation for accuracy and completeness, and I agree with the above. .Climmie Buelow Kishore Trueman Worlds MD

## 2024-06-26 ENCOUNTER — Inpatient Hospital Stay

## 2024-06-26 ENCOUNTER — Other Ambulatory Visit: Payer: Self-pay

## 2024-06-26 ENCOUNTER — Inpatient Hospital Stay: Attending: Hematology | Admitting: Hematology

## 2024-06-26 VITALS — BP 138/99 | HR 81 | Temp 97.5°F | Resp 20 | Wt 154.2 lb

## 2024-06-26 DIAGNOSIS — C9011 Plasma cell leukemia in remission: Secondary | ICD-10-CM | POA: Diagnosis not present

## 2024-06-26 DIAGNOSIS — C3490 Malignant neoplasm of unspecified part of unspecified bronchus or lung: Secondary | ICD-10-CM

## 2024-06-26 DIAGNOSIS — Z5189 Encounter for other specified aftercare: Secondary | ICD-10-CM | POA: Diagnosis not present

## 2024-06-26 DIAGNOSIS — C9001 Multiple myeloma in remission: Secondary | ICD-10-CM | POA: Diagnosis not present

## 2024-06-26 DIAGNOSIS — F17210 Nicotine dependence, cigarettes, uncomplicated: Secondary | ICD-10-CM | POA: Diagnosis not present

## 2024-06-26 DIAGNOSIS — Z79620 Long term (current) use of immunosuppressive biologic: Secondary | ICD-10-CM | POA: Insufficient documentation

## 2024-06-26 DIAGNOSIS — N289 Disorder of kidney and ureter, unspecified: Secondary | ICD-10-CM | POA: Diagnosis not present

## 2024-06-26 DIAGNOSIS — C3431 Malignant neoplasm of lower lobe, right bronchus or lung: Secondary | ICD-10-CM | POA: Insufficient documentation

## 2024-06-26 DIAGNOSIS — D630 Anemia in neoplastic disease: Secondary | ICD-10-CM | POA: Diagnosis not present

## 2024-06-26 DIAGNOSIS — Z7189 Other specified counseling: Secondary | ICD-10-CM

## 2024-06-26 DIAGNOSIS — Z5112 Encounter for antineoplastic immunotherapy: Secondary | ICD-10-CM | POA: Diagnosis not present

## 2024-06-26 DIAGNOSIS — Z5111 Encounter for antineoplastic chemotherapy: Secondary | ICD-10-CM | POA: Diagnosis present

## 2024-06-26 MED ORDER — CYANOCOBALAMIN 1000 MCG/ML IJ SOLN
1000.0000 ug | Freq: Once | INTRAMUSCULAR | Status: AC
  Administered 2024-06-26: 1000 ug via INTRAMUSCULAR
  Filled 2024-06-26: qty 1

## 2024-06-26 MED ORDER — APIXABAN 5 MG PO TABS: 5.0000 mg | ORAL_TABLET | Freq: Two times a day (BID) | ORAL | 5 refills | Status: DC

## 2024-06-27 ENCOUNTER — Encounter: Payer: Self-pay | Admitting: Hematology

## 2024-06-27 ENCOUNTER — Other Ambulatory Visit: Payer: Self-pay | Admitting: Pharmacist

## 2024-06-27 DIAGNOSIS — C3490 Malignant neoplasm of unspecified part of unspecified bronchus or lung: Secondary | ICD-10-CM

## 2024-06-27 DIAGNOSIS — Z7189 Other specified counseling: Secondary | ICD-10-CM

## 2024-06-28 ENCOUNTER — Other Ambulatory Visit: Payer: Self-pay

## 2024-06-29 ENCOUNTER — Encounter: Payer: Self-pay | Admitting: Hematology

## 2024-07-01 ENCOUNTER — Other Ambulatory Visit: Payer: Self-pay

## 2024-07-03 MED FILL — Fosaprepitant Dimeglumine For IV Infusion 150 MG (Base Eq): INTRAVENOUS | Qty: 5 | Status: AC

## 2024-07-04 ENCOUNTER — Other Ambulatory Visit: Payer: Self-pay

## 2024-07-04 ENCOUNTER — Other Ambulatory Visit

## 2024-07-04 ENCOUNTER — Inpatient Hospital Stay

## 2024-07-04 VITALS — BP 143/88 | HR 78 | Temp 98.8°F | Resp 18 | Wt 155.0 lb

## 2024-07-04 DIAGNOSIS — Z95828 Presence of other vascular implants and grafts: Secondary | ICD-10-CM

## 2024-07-04 DIAGNOSIS — C9010 Plasma cell leukemia not having achieved remission: Secondary | ICD-10-CM

## 2024-07-04 DIAGNOSIS — M84551A Pathological fracture in neoplastic disease, right femur, initial encounter for fracture: Secondary | ICD-10-CM

## 2024-07-04 DIAGNOSIS — Z5111 Encounter for antineoplastic chemotherapy: Secondary | ICD-10-CM | POA: Diagnosis not present

## 2024-07-04 DIAGNOSIS — Z7189 Other specified counseling: Secondary | ICD-10-CM

## 2024-07-04 DIAGNOSIS — C3490 Malignant neoplasm of unspecified part of unspecified bronchus or lung: Secondary | ICD-10-CM

## 2024-07-04 LAB — CBC WITH DIFFERENTIAL (CANCER CENTER ONLY)
Abs Immature Granulocytes: 0.01 K/uL (ref 0.00–0.07)
Basophils Absolute: 0.1 K/uL (ref 0.0–0.1)
Basophils Relative: 1 %
Eosinophils Absolute: 0.2 K/uL (ref 0.0–0.5)
Eosinophils Relative: 2 %
HCT: 40 % (ref 39.0–52.0)
Hemoglobin: 13.7 g/dL (ref 13.0–17.0)
Immature Granulocytes: 0 %
Lymphocytes Relative: 27 %
Lymphs Abs: 1.9 K/uL (ref 0.7–4.0)
MCH: 30.9 pg (ref 26.0–34.0)
MCHC: 34.3 g/dL (ref 30.0–36.0)
MCV: 90.3 fL (ref 80.0–100.0)
Monocytes Absolute: 0.6 K/uL (ref 0.1–1.0)
Monocytes Relative: 9 %
Neutro Abs: 4.4 K/uL (ref 1.7–7.7)
Neutrophils Relative %: 61 %
Platelet Count: 223 K/uL (ref 150–400)
RBC: 4.43 MIL/uL (ref 4.22–5.81)
RDW: 14.6 % (ref 11.5–15.5)
WBC Count: 7.1 K/uL (ref 4.0–10.5)
nRBC: 0 % (ref 0.0–0.2)

## 2024-07-04 LAB — CMP (CANCER CENTER ONLY)
ALT: 7 U/L (ref 0–44)
AST: 9 U/L — ABNORMAL LOW (ref 15–41)
Albumin: 3.9 g/dL (ref 3.5–5.0)
Alkaline Phosphatase: 69 U/L (ref 38–126)
Anion gap: 9 (ref 5–15)
BUN: 25 mg/dL — ABNORMAL HIGH (ref 8–23)
CO2: 24 mmol/L (ref 22–32)
Calcium: 9.1 mg/dL (ref 8.9–10.3)
Chloride: 109 mmol/L (ref 98–111)
Creatinine: 1.57 mg/dL — ABNORMAL HIGH (ref 0.61–1.24)
GFR, Estimated: 48 mL/min — ABNORMAL LOW (ref >60)
Glucose, Bld: 115 mg/dL — ABNORMAL HIGH (ref 70–99)
Potassium: 3.7 mmol/L (ref 3.5–5.1)
Sodium: 142 mmol/L (ref 135–145)
Total Bilirubin: 0.3 mg/dL (ref 0.0–1.2)
Total Protein: 7 g/dL (ref 6.5–8.1)

## 2024-07-04 LAB — TSH: TSH: 2.05 u[IU]/mL (ref 0.350–4.500)

## 2024-07-04 LAB — VITAMIN B12: Vitamin B-12: 625 pg/mL (ref 180–914)

## 2024-07-04 MED ORDER — DEXAMETHASONE SODIUM PHOSPHATE 10 MG/ML IJ SOLN
10.0000 mg | Freq: Once | INTRAMUSCULAR | Status: AC
  Administered 2024-07-04: 10 mg via INTRAVENOUS
  Filled 2024-07-04: qty 1

## 2024-07-04 MED ORDER — SODIUM CHLORIDE 0.9 % IV SOLN: INTRAVENOUS | Status: DC

## 2024-07-04 MED ORDER — ONDANSETRON HCL 8 MG PO TABS: 8.0000 mg | ORAL_TABLET | Freq: Three times a day (TID) | ORAL | 0 refills | Status: AC | PRN

## 2024-07-04 MED ORDER — SODIUM CHLORIDE 0.9 % IV SOLN
150.0000 mg | Freq: Once | INTRAVENOUS | Status: AC
  Administered 2024-07-04: 150 mg via INTRAVENOUS
  Filled 2024-07-04: qty 150

## 2024-07-04 MED ORDER — PALONOSETRON HCL INJECTION 0.25 MG/5ML
0.2500 mg | Freq: Once | INTRAVENOUS | Status: AC
  Administered 2024-07-04: 0.25 mg via INTRAVENOUS
  Filled 2024-07-04: qty 5

## 2024-07-04 MED ORDER — PROCHLORPERAZINE MALEATE 10 MG PO TABS: 10.0000 mg | ORAL_TABLET | Freq: Four times a day (QID) | ORAL | 0 refills | Status: AC | PRN

## 2024-07-04 MED ORDER — SODIUM CHLORIDE 0.9 % IV SOLN
500.0000 mg/m2 | Freq: Once | INTRAVENOUS | Status: AC
  Administered 2024-07-04: 900 mg via INTRAVENOUS
  Filled 2024-07-04: qty 20

## 2024-07-04 MED ORDER — FOLIC ACID 1 MG PO TABS: 1.0000 mg | ORAL_TABLET | Freq: Every day | ORAL | 4 refills | Status: DC

## 2024-07-04 MED ORDER — SODIUM CHLORIDE 0.9% FLUSH
10.0000 mL | Freq: Once | INTRAVENOUS | Status: AC
Start: 2024-07-04 — End: 2024-07-04
  Administered 2024-07-04: 10 mL

## 2024-07-04 MED ORDER — SODIUM CHLORIDE 0.9 % IV SOLN
350.5000 mg | Freq: Once | INTRAVENOUS | Status: AC
  Administered 2024-07-04: 350 mg via INTRAVENOUS
  Filled 2024-07-04: qty 35

## 2024-07-04 MED ORDER — DEXAMETHASONE 4 MG PO TABS: 8.0000 mg | ORAL_TABLET | Freq: Every day | ORAL | 2 refills | Status: DC

## 2024-07-04 MED ORDER — SODIUM CHLORIDE 0.9 % IV SOLN
200.0000 mg | Freq: Once | INTRAVENOUS | Status: AC
  Administered 2024-07-04: 200 mg via INTRAVENOUS
  Filled 2024-07-04: qty 200

## 2024-07-04 NOTE — Patient Instructions (Signed)
 [facility] - A DEPT OF [facility]  Discharge Instructions: Thank you for choosing [facility] to provide your oncology and hematology care.   If you have a lab appointment with the Cancer Center, please go directly to the Cancer Center and check in at the registration area.   Wear comfortable clothing and clothing appropriate for easy access to any Portacath or PICC line.   We strive to give you quality time with your provider. You may need to reschedule your appointment if you arrive late (15 or more minutes).  Arriving late affects you and other patients whose appointments are after yours.  Also, if you miss three or more appointments without notifying the office, you may be dismissed from the clinic at the provider's discretion.      For prescription refill requests, have your pharmacy contact our office and allow 72 hours for refills to be completed.    Today you received the following chemotherapy and/or immunotherapy agents Keytruda, Aliminta, Carboplatin      To help prevent nausea and vomiting after your treatment, we encourage you to take your nausea medication as directed.  BELOW ARE SYMPTOMS THAT SHOULD BE REPORTED IMMEDIATELY: *FEVER GREATER THAN 100.4 F (38 C) OR HIGHER *CHILLS OR SWEATING *NAUSEA AND VOMITING THAT IS NOT CONTROLLED WITH YOUR NAUSEA MEDICATION *UNUSUAL SHORTNESS OF BREATH *UNUSUAL BRUISING OR BLEEDING *URINARY PROBLEMS (pain or burning when urinating, or frequent urination) *BOWEL PROBLEMS (unusual diarrhea, constipation, pain near the anus) TENDERNESS IN MOUTH AND THROAT WITH OR WITHOUT PRESENCE OF ULCERS (sore throat, sores in mouth, or a toothache) UNUSUAL RASH, SWELLING OR PAIN  UNUSUAL VAGINAL DISCHARGE OR ITCHING   Items with * indicate a potential emergency and should be followed up as soon as possible or go to the Emergency Department if any problems should occur.  Please show the CHEMOTHERAPY ALERT  CARD or IMMUNOTHERAPY ALERT CARD at check-in to the Emergency Department and triage nurse.  Should you have questions after your visit or need to cancel or reschedule your appointment, please contact [facility] - A DEPT OF [facility]  Dept: [phone]  and follow the prompts.  Office hours are 8:00 a.m. to 4:30 p.m. Monday - Friday. Please note that voicemails left after 4:00 p.m. may not be returned until the following business day.  We are closed weekends and major holidays. You have access to a nurse at all times for urgent questions. Please call the main number to the clinic Dept: [phone] and follow the prompts.   For any non-urgent questions, you may also contact your provider using MyChart. We now offer e-Visits for anyone 32 and older to request care online for non-urgent symptoms. For details visit [email or web address].   Also download the MyChart app! Go to the app store, search MyChart, open the app, select [place], and log in with your MyChart username and password.

## 2024-07-05 LAB — T4: T4, Total: 7 ug/dL (ref 4.5–12.0)

## 2024-07-07 ENCOUNTER — Ambulatory Visit

## 2024-07-07 ENCOUNTER — Inpatient Hospital Stay

## 2024-07-07 DIAGNOSIS — Z7189 Other specified counseling: Secondary | ICD-10-CM

## 2024-07-07 DIAGNOSIS — Z5111 Encounter for antineoplastic chemotherapy: Secondary | ICD-10-CM | POA: Diagnosis not present

## 2024-07-07 DIAGNOSIS — C3490 Malignant neoplasm of unspecified part of unspecified bronchus or lung: Secondary | ICD-10-CM

## 2024-07-07 MED ORDER — PEGFILGRASTIM-JMDB 6 MG/0.6ML ~~LOC~~ SOSY
6.0000 mg | PREFILLED_SYRINGE | Freq: Once | SUBCUTANEOUS | Status: AC
  Administered 2024-07-07: 6 mg via SUBCUTANEOUS
  Filled 2024-07-07: qty 0.6

## 2024-07-18 ENCOUNTER — Ambulatory Visit: Admitting: Hematology

## 2024-07-18 ENCOUNTER — Other Ambulatory Visit

## 2024-07-18 ENCOUNTER — Ambulatory Visit

## 2024-07-23 ENCOUNTER — Other Ambulatory Visit: Payer: Self-pay

## 2024-07-23 ENCOUNTER — Emergency Department (HOSPITAL_COMMUNITY)

## 2024-07-23 ENCOUNTER — Emergency Department (HOSPITAL_COMMUNITY)
Admission: EM | Admit: 2024-07-23 | Discharge: 2024-07-23 | Disposition: A | Discharge status: Home / Self Care | Source: Ambulatory Visit | Attending: Emergency Medicine | Admitting: Emergency Medicine

## 2024-07-23 ENCOUNTER — Other Ambulatory Visit (HOSPITAL_COMMUNITY): Payer: Self-pay

## 2024-07-23 ENCOUNTER — Encounter (HOSPITAL_COMMUNITY): Payer: Self-pay

## 2024-07-23 DIAGNOSIS — J449 Chronic obstructive pulmonary disease, unspecified: Secondary | ICD-10-CM | POA: Insufficient documentation

## 2024-07-23 DIAGNOSIS — I10 Essential (primary) hypertension: Secondary | ICD-10-CM | POA: Insufficient documentation

## 2024-07-23 DIAGNOSIS — M79604 Pain in right leg: Secondary | ICD-10-CM

## 2024-07-23 DIAGNOSIS — M79651 Pain in right thigh: Secondary | ICD-10-CM | POA: Insufficient documentation

## 2024-07-23 DIAGNOSIS — Z85118 Personal history of other malignant neoplasm of bronchus and lung: Secondary | ICD-10-CM | POA: Insufficient documentation

## 2024-07-23 DIAGNOSIS — Z7901 Long term (current) use of anticoagulants: Secondary | ICD-10-CM | POA: Insufficient documentation

## 2024-07-23 LAB — CBC
HCT: 38.5 % — ABNORMAL LOW (ref 39.0–52.0)
Hemoglobin: 12.4 g/dL — ABNORMAL LOW (ref 13.0–17.0)
MCH: 30.5 pg (ref 26.0–34.0)
MCHC: 32.2 g/dL (ref 30.0–36.0)
MCV: 94.6 fL (ref 80.0–100.0)
Platelets: 271 K/uL (ref 150–400)
RBC: 4.07 MIL/uL — ABNORMAL LOW (ref 4.22–5.81)
RDW: 14.3 % (ref 11.5–15.5)
WBC: 9.8 K/uL (ref 4.0–10.5)
nRBC: 0 % (ref 0.0–0.2)

## 2024-07-23 LAB — BASIC METABOLIC PANEL WITH GFR
Anion gap: 10 (ref 5–15)
BUN: 28 mg/dL — ABNORMAL HIGH (ref 8–23)
CO2: 22 mmol/L (ref 22–32)
Calcium: 9.3 mg/dL (ref 8.9–10.3)
Chloride: 106 mmol/L (ref 98–111)
Creatinine, Ser: 1.36 mg/dL — ABNORMAL HIGH (ref 0.61–1.24)
GFR, Estimated: 57 mL/min — ABNORMAL LOW (ref >60)
Glucose, Bld: 99 mg/dL (ref 70–99)
Potassium: 4.1 mmol/L (ref 3.5–5.1)
Sodium: 138 mmol/L (ref 135–145)

## 2024-07-23 MED ORDER — OXYCODONE-ACETAMINOPHEN 5-325 MG PO TABS
1.0000 | ORAL_TABLET | Freq: Four times a day (QID) | ORAL | 0 refills | Status: DC | PRN
  Filled 2024-07-23: qty 12, 3d supply, fill #0

## 2024-07-23 MED ORDER — MORPHINE SULFATE (PF) 4 MG/ML IV SOLN
4.0000 mg | Freq: Once | INTRAVENOUS | Status: AC
  Administered 2024-07-23 (×2): 4 mg via INTRAVENOUS
  Filled 2024-07-23: qty 1

## 2024-07-23 MED ORDER — NAPROXEN 375 MG PO TABS
375.0000 mg | ORAL_TABLET | Freq: Two times a day (BID) | ORAL | 0 refills | Status: DC
  Filled 2024-07-23 (×2): qty 20, 10d supply, fill #0

## 2024-07-23 MED ORDER — ONDANSETRON HCL 4 MG/2ML IJ SOLN
4.0000 mg | Freq: Once | INTRAMUSCULAR | Status: AC
  Administered 2024-07-23 (×2): 4 mg via INTRAVENOUS
  Filled 2024-07-23: qty 2

## 2024-07-23 NOTE — ED Triage Notes (Signed)
 Pt presents to ED from [facility] C/O R leg pain X 1-2 months. Hx multiple myeloma in remission, states metal rod placed in R leg 15 years ago. Went to urgent care in [place], they sent him here d/t concern about something they saw on Xray today. Denies fever, chills.

## 2024-07-23 NOTE — ED Provider Notes (Signed)
 [place] EMERGENCY DEPARTMENT AT [facility] Provider Note   CSN: [id number] Arrival date & time: 07/23/24  9075     Patient presents with: Hip Pain   Andres Fernandez is a 68 y.o. male.    Hip Pain   Patient has a history of multiple myeloma, hypertension, hepatitis C, COPD, acute kidney injury, thrombocytopenia lung cancer.  Patient states he had surgery in his right leg associated with his multiple myeloma many years ago.  Patient states he had a rod placed.  Per medical records patient had a right femoral intramedullary nail placed.  Patient states in the last couple weeks he has been having increasing pain in his right thigh.  He went to an urgent care today where he had x-rays.  Patient was told there was something abnormal and needed to come to an emergency room immediately.    Prior to Admission medications   Medication Sig Start Date End Date Taking? Authorizing Provider  naproxen  (NAPROSYN ) 375 MG tablet Take 1 tablet (375 mg total) by mouth 2 (two) times daily. 07/23/24  Yes Randol Simmonds, MD  oxyCODONE -acetaminophen  (PERCOCET/ROXICET) 5-325 MG tablet Take 1 tablet by mouth every 6 (six) hours as needed for severe pain (pain score 7-10). 07/23/24  Yes Randol Simmonds, MD  acyclovir  (ZOVIRAX ) 400 MG tablet Take 1 tablet (400 mg total) by mouth daily. 08/03/23   [name], MD  albuterol  (VENTOLIN  HFA) 108 [phone] Base) MCG/ACT inhaler Inhale 2 puffs into the lungs every 6 (six) hours as needed for shortness of breath. 04/02/24   [provider]  apixaban  (ELIQUIS ) 5 MG TABS tablet Take 1 tablet (5 mg total) by mouth 2 (two) times daily. 06/26/24   [name], MD  dexamethasone  (DECADRON ) 4 MG tablet Take 2 tablets (8 mg total) by mouth daily. Take 2 tablets (8 mg total) by mouth daily with food for 3 days after each carbo treatment 07/04/24   [name], MD  ferrous sulfate  325 (65 FE) MG EC tablet TAKE 1 TABLET BY MOUTH EVERY DAY WITH  BREAKFAST Patient not taking: No sig reported 02/20/23   Neomi Lis T, PA-C  Fluticasone Propionate , Inhal, (FLUTICASONE PROPIONATE  DISKUS) 250 MCG/ACT AEPB TAKE 1 PUFF BY MOUTH EVERY DAY Patient not taking: No sig reported 09/28/23   [name], MD  folic acid  (FOLVITE ) 1 MG tablet Take 1 tablet (1 mg total) by mouth daily. 07/04/24   Kale, Gautam Kishore, MD  lenalidomide  (REVLIMID ) 10 MG capsule Take 1 capsule (10 mg total) by mouth daily. Take 1 capsule (10 mg total) by mouth daily for 21 days. Take 7 days off. Repeat. 04/08/24   [name], MD  lidocaine -prilocaine  (EMLA ) cream Apply 1 Application topically as needed. Apply to Port-A-Cath at least 1 hr prior to treatment. 01/18/24   Kale, Gautam Kishore, MD  Multiple Vitamins-Minerals (MULTIVITAMIN WITH MINERALS) tablet Take 1 tablet by mouth at bedtime.    [provider]  nicotine  (NICODERM CQ  - DOSED IN MG/24 HOURS) 14 mg/24hr patch PLACE 1 PATCH (14 MG TOTAL) ONTO THE SKIN DAILY. Patient not taking: Reported on 04/17/2024 09/28/23   [name], MD  ondansetron  (ZOFRAN ) 8 MG tablet Take 1 tablet (8 mg total) by mouth every 8 (eight) hours as needed for nausea or vomiting. 07/04/24   [name], MD  prochlorperazine  (COMPAZINE ) 10 MG tablet Take 1 tablet (10 mg total) by mouth every 6 (six) hours as needed for nausea or vomiting.  07/04/24   [name], MD  Spacer/Aero-Holding Chambers (AEROCHAMBER MV) inhaler Use as instructed 04/23/24   Isadora Hose, MD  Vitamin D , Ergocalciferol , (DRISDOL ) 1.25 MG (50000 UNIT) CAPS capsule Take 1 capsule (50,000 Units total) by mouth once a week. Patient not taking: Reported on 04/17/2024 09/28/23   [name], MD    Allergies: Patient has no known allergies.    Review of Systems  Updated Vital Signs BP (!) 139/92   Pulse 79   Temp 97.9 F (36.6 C)   Resp 18   Ht 1.753 m (5' 9)   Wt 68 kg   SpO2 100%   BMI 22.15 kg/m   Physical  Exam Vitals and nursing note reviewed.  Constitutional:      General: He is not in acute distress.    Appearance: He is well-developed.  HENT:     Head: Normocephalic and atraumatic.     Right Ear: External ear normal.     Left Ear: External ear normal.  Eyes:     General: No scleral icterus.       Right eye: No discharge.        Left eye: No discharge.     Conjunctiva/sclera: Conjunctivae normal.  Neck:     Trachea: No tracheal deviation.  Cardiovascular:     Rate and Rhythm: Normal rate.  Pulmonary:     Effort: Pulmonary effort is normal. No respiratory distress.     Breath sounds: No stridor.  Abdominal:     General: There is no distension.  Musculoskeletal:        General: No swelling or deformity.     Cervical back: Neck supple.  Skin:    General: Skin is warm and dry.     Findings: No rash.  Neurological:     Mental Status: He is alert. Mental status is at baseline.     Cranial Nerves: No dysarthria or facial asymmetry.     Motor: No seizure activity.     (all labs ordered are listed, but only abnormal results are displayed) Labs Reviewed  CBC - Abnormal; Notable for the following components:      Result Value   RBC 4.07 (*)    Hemoglobin 12.4 (*)    HCT 38.5 (*)    All other components within normal limits  BASIC METABOLIC PANEL WITH GFR - Abnormal; Notable for the following components:   BUN 28 (*)    Creatinine, Ser 1.36 (*)    GFR, Estimated 57 (*)    All other components within normal limits    EKG: None  Radiology: DG Femur Min 2 Views Right Result Date: 07/23/2024 CLINICAL DATA:  Right leg pain for 1-2 months. History of multiple myeloma in remission. EXAM: RIGHT FEMUR 2 VIEWS COMPARISON:  Right knee radiographs dated 03/28/2024. Right femur radiographs dated 07/10/2022. FINDINGS: No acute fracture or dislocation. Prior ORIF of the right femur with intact hardware. No significant periprosthetic lucency. Partially evaluated lucent lesion again noted  in the proximal fibular diaphysis may relate to underlying multiple myeloma. Mild medial femorotibial compartment joint space narrowing of the knee. Soft tissues are unremarkable. IMPRESSION: 1. No acute osseous abnormality. 2. Prior ORIF of the right femur with intact hardware. 3. Partially evaluated lucent lesion again noted in the proximal fibular diaphysis may relate to underlying multiple myeloma. 4. Mild medial femorotibial compartment joint space narrowing of the knee. Electronically Signed   By: Harrietta Sherry M.D.   On: 07/23/2024 11:59  Procedures   Medications Ordered in the ED  morphine  (PF) 4 MG/ML injection 4 mg (4 mg Intravenous Given 07/23/24 1150)  ondansetron  (ZOFRAN ) injection 4 mg (4 mg Intravenous Given 07/23/24 1150)    Clinical Course as of 07/23/24 1247  Wed Jul 23, 2024  1227 CBC(!) CBC normal. [JK]  1227 Basic metabolic panel(!) Metabolic panel normal [JK]  1227 X-ray does not show any acute abnormality.  Patient does have lucent lesion in the proximal fibular diaphysis.  Could be related to multiple myeloma. [JK]    Clinical Course User Index [JK] Randol Simmonds, MD                                 Medical Decision Making Problems Addressed: Pain of right lower extremity: acute illness or injury  Amount and/or Complexity of Data Reviewed Labs: ordered. Decision-making details documented in ED Course. Radiology: ordered and independent interpretation performed.  Risk Prescription drug management.   Patient presented to ED with complaints of acute leg pain.  Patient states symptoms ongoing for the last couple of weeks.  Patient has not had any fevers or chills.  He has not noticed any swelling.  Patient does have significant tenderness in the right thigh area.  There is no evidence of crepitus.  No findings to suggest infection.  Patient's laboratory test shows normal white count.  Renal function unremarkable.  No findings to suggest acute infection.   Patient does not have any tenderness or swelling of the lower leg.  No signs of vascular occlusion.  He is not having any signs of DVT.  X-ray does not show any acute osseous abnormality. Etiology of symptoms unclear.  Will give him pain medications to take at home.  Recommend outpatient follow-up with his orthopedic doctor to be rechecked.      Final diagnoses:  Pain of right lower extremity    ED Discharge Orders          Ordered    oxyCODONE -acetaminophen  (PERCOCET/ROXICET) 5-325 MG tablet  Every 6 hours PRN        07/23/24 1244    naproxen  (NAPROSYN ) 375 MG tablet  2 times daily        07/23/24 1244               Randol Simmonds, MD 07/23/24 1247

## 2024-07-23 NOTE — Discharge Instructions (Addendum)
 The x-ray did not show any signs of fracture or abnormality associated with the hardware in your leg.  Take the medications to help with pain.  Follow-up with the orthopedic doctor for further evaluation.  Return to the ER for fevers chills or other concerning symptoms

## 2024-07-25 ENCOUNTER — Inpatient Hospital Stay

## 2024-07-25 ENCOUNTER — Other Ambulatory Visit (HOSPITAL_COMMUNITY): Payer: Self-pay

## 2024-07-25 ENCOUNTER — Ambulatory Visit: Admitting: Hematology

## 2024-07-25 ENCOUNTER — Inpatient Hospital Stay: Attending: Hematology | Admitting: Hematology

## 2024-07-25 ENCOUNTER — Other Ambulatory Visit

## 2024-07-25 ENCOUNTER — Ambulatory Visit

## 2024-07-25 ENCOUNTER — Other Ambulatory Visit (HOSPITAL_COMMUNITY): Payer: Self-pay | Admitting: Orthopedic Surgery

## 2024-07-25 ENCOUNTER — Other Ambulatory Visit: Payer: Self-pay

## 2024-07-25 VITALS — BP 155/94 | HR 76 | Temp 97.9°F | Resp 20 | Wt 156.0 lb

## 2024-07-25 VITALS — BP 154/91 | HR 77 | Temp 97.5°F | Resp 18

## 2024-07-25 DIAGNOSIS — C9000 Multiple myeloma not having achieved remission: Secondary | ICD-10-CM

## 2024-07-25 DIAGNOSIS — Z87891 Personal history of nicotine dependence: Secondary | ICD-10-CM | POA: Insufficient documentation

## 2024-07-25 DIAGNOSIS — D630 Anemia in neoplastic disease: Secondary | ICD-10-CM | POA: Diagnosis not present

## 2024-07-25 DIAGNOSIS — M79604 Pain in right leg: Secondary | ICD-10-CM

## 2024-07-25 DIAGNOSIS — C3490 Malignant neoplasm of unspecified part of unspecified bronchus or lung: Secondary | ICD-10-CM

## 2024-07-25 DIAGNOSIS — C9011 Plasma cell leukemia in remission: Secondary | ICD-10-CM | POA: Insufficient documentation

## 2024-07-25 DIAGNOSIS — Z5112 Encounter for antineoplastic immunotherapy: Secondary | ICD-10-CM | POA: Diagnosis not present

## 2024-07-25 DIAGNOSIS — M84551A Pathological fracture in neoplastic disease, right femur, initial encounter for fracture: Secondary | ICD-10-CM

## 2024-07-25 DIAGNOSIS — Z5189 Encounter for other specified aftercare: Secondary | ICD-10-CM | POA: Insufficient documentation

## 2024-07-25 DIAGNOSIS — C9001 Multiple myeloma in remission: Secondary | ICD-10-CM | POA: Diagnosis not present

## 2024-07-25 DIAGNOSIS — Z95828 Presence of other vascular implants and grafts: Secondary | ICD-10-CM

## 2024-07-25 DIAGNOSIS — M25551 Pain in right hip: Secondary | ICD-10-CM | POA: Diagnosis not present

## 2024-07-25 DIAGNOSIS — Z7189 Other specified counseling: Secondary | ICD-10-CM | POA: Diagnosis not present

## 2024-07-25 DIAGNOSIS — N189 Chronic kidney disease, unspecified: Secondary | ICD-10-CM | POA: Diagnosis not present

## 2024-07-25 DIAGNOSIS — C9010 Plasma cell leukemia not having achieved remission: Secondary | ICD-10-CM

## 2024-07-25 DIAGNOSIS — M79651 Pain in right thigh: Secondary | ICD-10-CM | POA: Diagnosis not present

## 2024-07-25 DIAGNOSIS — Z5111 Encounter for antineoplastic chemotherapy: Secondary | ICD-10-CM | POA: Insufficient documentation

## 2024-07-25 LAB — CMP (CANCER CENTER ONLY)
ALT: 10 U/L (ref 0–44)
AST: 10 U/L — ABNORMAL LOW (ref 15–41)
Albumin: 4 g/dL (ref 3.5–5.0)
Alkaline Phosphatase: 83 U/L (ref 38–126)
Anion gap: 8 (ref 5–15)
BUN: 28 mg/dL — ABNORMAL HIGH (ref 8–23)
CO2: 24 mmol/L (ref 22–32)
Calcium: 8.8 mg/dL — ABNORMAL LOW (ref 8.9–10.3)
Chloride: 108 mmol/L (ref 98–111)
Creatinine: 1.57 mg/dL — ABNORMAL HIGH (ref 0.61–1.24)
GFR, Estimated: 48 mL/min — ABNORMAL LOW (ref >60)
Glucose, Bld: 124 mg/dL — ABNORMAL HIGH (ref 70–99)
Potassium: 4 mmol/L (ref 3.5–5.1)
Sodium: 140 mmol/L (ref 135–145)
Total Bilirubin: 0.2 mg/dL (ref 0.0–1.2)
Total Protein: 6.8 g/dL (ref 6.5–8.1)

## 2024-07-25 LAB — CBC WITH DIFFERENTIAL (CANCER CENTER ONLY)
Abs Immature Granulocytes: 0.08 K/uL — ABNORMAL HIGH (ref 0.00–0.07)
Basophils Absolute: 0.1 K/uL (ref 0.0–0.1)
Basophils Relative: 1 %
Eosinophils Absolute: 0 K/uL (ref 0.0–0.5)
Eosinophils Relative: 0 %
HCT: 37.4 % — ABNORMAL LOW (ref 39.0–52.0)
Hemoglobin: 13 g/dL (ref 13.0–17.0)
Immature Granulocytes: 1 %
Lymphocytes Relative: 20 %
Lymphs Abs: 2 K/uL (ref 0.7–4.0)
MCH: 31.3 pg (ref 26.0–34.0)
MCHC: 34.8 g/dL (ref 30.0–36.0)
MCV: 89.9 fL (ref 80.0–100.0)
Monocytes Absolute: 0.9 K/uL (ref 0.1–1.0)
Monocytes Relative: 9 %
Neutro Abs: 6.7 K/uL (ref 1.7–7.7)
Neutrophils Relative %: 69 %
Platelet Count: 334 K/uL (ref 150–400)
RBC: 4.16 MIL/uL — ABNORMAL LOW (ref 4.22–5.81)
RDW: 14.2 % (ref 11.5–15.5)
WBC Count: 9.7 K/uL (ref 4.0–10.5)
nRBC: 0 % (ref 0.0–0.2)

## 2024-07-25 LAB — C-REACTIVE PROTEIN: CRP: 0.9 mg/dL (ref <1.0)

## 2024-07-25 LAB — SEDIMENTATION RATE: Sed Rate: 18 mm/h — ABNORMAL HIGH (ref 0–16)

## 2024-07-25 MED ORDER — LENALIDOMIDE 10 MG PO CAPS: 10.0000 mg | ORAL_CAPSULE | Freq: Every day | ORAL | 0 refills | Status: DC

## 2024-07-25 MED ORDER — OXYCODONE-ACETAMINOPHEN 5-325 MG PO TABS
1.0000 | ORAL_TABLET | Freq: Four times a day (QID) | ORAL | 0 refills | Status: DC | PRN
  Filled 2024-07-25: qty 30, 8d supply, fill #0

## 2024-07-25 MED ORDER — SODIUM CHLORIDE 0.9 % IV SOLN
150.0000 mg | Freq: Once | INTRAVENOUS | Status: AC
  Administered 2024-07-25: 150 mg via INTRAVENOUS
  Filled 2024-07-25: qty 150

## 2024-07-25 MED ORDER — SODIUM CHLORIDE 0.9 % IV SOLN
500.0000 mg/m2 | Freq: Once | INTRAVENOUS | Status: AC
  Administered 2024-07-25: 900 mg via INTRAVENOUS
  Filled 2024-07-25: qty 20

## 2024-07-25 MED ORDER — SODIUM CHLORIDE 0.9 % IV SOLN
200.0000 mg | Freq: Once | INTRAVENOUS | Status: AC
  Administered 2024-07-25: 200 mg via INTRAVENOUS
  Filled 2024-07-25: qty 200

## 2024-07-25 MED ORDER — PALONOSETRON HCL INJECTION 0.25 MG/5ML
0.2500 mg | Freq: Once | INTRAVENOUS | Status: AC
  Administered 2024-07-25: 0.25 mg via INTRAVENOUS
  Filled 2024-07-25: qty 5

## 2024-07-25 MED ORDER — DEXAMETHASONE SODIUM PHOSPHATE 10 MG/ML IJ SOLN
10.0000 mg | Freq: Once | INTRAMUSCULAR | Status: AC
  Administered 2024-07-25: 10 mg via INTRAVENOUS
  Filled 2024-07-25: qty 1

## 2024-07-25 MED ORDER — SODIUM CHLORIDE 0.9 % IV SOLN
350.5000 mg | Freq: Once | INTRAVENOUS | Status: AC
  Administered 2024-07-25: 350 mg via INTRAVENOUS
  Filled 2024-07-25: qty 35

## 2024-07-25 MED ORDER — SODIUM CHLORIDE 0.9 % IV SOLN
INTRAVENOUS | Status: DC
Start: 2024-07-25 — End: 2024-07-25

## 2024-07-25 MED ORDER — SODIUM CHLORIDE 0.9% FLUSH: 10.0000 mL | INTRAVENOUS | Status: DC | PRN

## 2024-07-25 MED ORDER — SODIUM CHLORIDE 0.9% FLUSH
10.0000 mL | Freq: Once | INTRAVENOUS | Status: AC
Start: 2024-07-25 — End: 2024-07-25
  Administered 2024-07-25: 10 mL

## 2024-07-25 NOTE — Patient Instructions (Signed)
 [facility] - A DEPT OF [facility]  Discharge Instructions: Thank you for choosing [facility] to provide your oncology and hematology care.   If you have a lab appointment with the Cancer Center, please go directly to the Cancer Center and check in at the registration area.   Wear comfortable clothing and clothing appropriate for easy access to any Portacath or PICC line.   We strive to give you quality time with your provider. You may need to reschedule your appointment if you arrive late (15 or more minutes).  Arriving late affects you and other patients whose appointments are after yours.  Also, if you miss three or more appointments without notifying the office, you may be dismissed from the clinic at the provider's discretion.      For prescription refill requests, have your pharmacy contact our office and allow 72 hours for refills to be completed.    Today you received the following chemotherapy and/or immunotherapy agents: pembrolizumab, pemetrexed, and carboplatin      To help prevent nausea and vomiting after your treatment, we encourage you to take your nausea medication as directed.  BELOW ARE SYMPTOMS THAT SHOULD BE REPORTED IMMEDIATELY: *FEVER GREATER THAN 100.4 F (38 C) OR HIGHER *CHILLS OR SWEATING *NAUSEA AND VOMITING THAT IS NOT CONTROLLED WITH YOUR NAUSEA MEDICATION *UNUSUAL SHORTNESS OF BREATH *UNUSUAL BRUISING OR BLEEDING *URINARY PROBLEMS (pain or burning when urinating, or frequent urination) *BOWEL PROBLEMS (unusual diarrhea, constipation, pain near the anus) TENDERNESS IN MOUTH AND THROAT WITH OR WITHOUT PRESENCE OF ULCERS (sore throat, sores in mouth, or a toothache) UNUSUAL RASH, SWELLING OR PAIN  UNUSUAL VAGINAL DISCHARGE OR ITCHING   Items with * indicate a potential emergency and should be followed up as soon as possible or go to the Emergency Department if any problems should occur.  Please show the  CHEMOTHERAPY ALERT CARD or IMMUNOTHERAPY ALERT CARD at check-in to the Emergency Department and triage nurse.  Should you have questions after your visit or need to cancel or reschedule your appointment, please contact [facility] - A DE[facility]  Dept: [phone]  and follow the prompts.  Office hours are 8:00 a.m. to 4:30 p.m. Monday - Friday. Please note that voicemails left after 4:00 p.m. may not be returned until the following business day.  We are closed weekends and major holidays. You have access to a nurse at all times for urgent questions. Please call the main number to the clinic Dept: [phone] and follow the prompts.   For any non-urgent questions, you may also contact your provider using MyChart. We now offer e-Visits for anyone 39 and older to request care online for non-urgent symptoms. For details visit [email or web address].   Also download the MyChart app! Go to the app store, search "MyChart", open the app, select [place], and log in with your MyChart username and password.

## 2024-07-25 NOTE — Progress Notes (Unsigned)
 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  INTERVAL HISTORY:   Andres Fernandez is a 68 y.o. male here for continued evaluation and management of his multiple myeloma and continued Carfilzomib  maintenance and newly diagnosed lung adenocarcinoma.    Patient was last seen by me on 06/06/2024.  He reports cramping in his neck muscle.   He reports that he is currently smoking.   He denies any back pain, abdominal pain, or leg swelling. Patient is eating well and staying well-hydrated.   He reports that he is able to take some time off of work if needed from a fatigue standpoint.   REVIEW OF SYSTEMS:    10 Point review of Systems was done is negative except as noted above.   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   SIRS (systemic inflammatory response syndrome) (HCC)    Thrombocytopenia (HCC)    Tobacco abuse     SURGICAL HISTORY: Past Surgical History:  Procedure Laterality Date   APPENDECTOMY     BRONCHOSCOPY, WITH BIOPSY USING ELECTROMAGNETIC NAVIGATION Bilateral 04/29/2024   Procedure: ROBOTIC ASSISTED NAVIGATIONAL BRONCHOSCOPY;  Surgeon: Isadora Hose, MD;  Location: [facility];  Service: Pulmonary;  Laterality: Bilateral;   ENDOBRONCHIAL ULTRASOUND Bilateral 04/29/2024   Procedure: ENDOBRONCHIAL ULTRASOUND (EBUS);  Surgeon: Isadora Hose, MD;  Location: [facility];  Service: Pulmonary;  Laterality:  Bilateral;   FEMUR IM NAIL Right 11/20/2017   Procedure: RIGHT FEMORAL INTRAMEDULLARY (IM) NAIL;  Surgeon: Sharl Selinda Dover, MD;  Location: [facility];  Service: Orthopedics;  Laterality: Right;   IR IMAGING GUIDED PORT INSERTION  08/19/2021   PENILE PROSTHESIS IMPLANT      SOCIAL HISTORY: Social History   Socioeconomic History   Marital status: Single    Spouse name: Not on file   Number of children: Not on file   Years of education: Not on file   Highest education level: Not on file  Occupational History   Not on file  Tobacco Use   Smoking status: Some Days    Current packs/day: 0.25    Average packs/day: 0.3 packs/day for 0.3 years (0.1 ttl pk-yrs)    Types: Cigarettes    Start date: 04/16/2024    Last attempt to quit: 11/09/2017   Smokeless tobacco: Never   Tobacco comments:    Smokes a cigarette every once in a while starting about 2 weeks ago - 04/23/24  Vaping Use   Vaping status: Never Used  Substance and Sexual Activity   Alcohol use: Not Currently   Drug use: Not Currently   Sexual activity: Not on file  Other Topics Concern   Not on file  Social History Narrative   Not on file   Social Drivers of Health   Financial Resource Strain: Not on file  Food Insecurity: Low Risk  (06/25/2024)   Received from [facility]   Hunger Vital Sign    Within the past 12 months, you  worried that your food would run out before you got money to buy more: Never true    Within the past 12 months, the food you bought just didn't last and you didn't have money to get more. : Never true  Transportation Needs: No Transportation Needs (06/25/2024)   Received from [facility]    In the past 12 months, has lack of reliable transportation kept you from medical appointments, meetings, work or from getting things needed for daily living? : No  Physical Activity: Not on file  Stress: Not on file  Social Connections: Moderately Integrated (04/17/2024)   Social  Connection and Isolation Panel    Frequency of Communication with Friends and Family: More than three times a week    Frequency of Social Gatherings with Friends and Family: Three times a week    Attends Religious Services: More than 4 times per year    Active Member of Clubs or Organizations: Yes    Attends Banker Meetings: More than 4 times per year    Marital Status: Divorced  Intimate Partner Violence: Not At Risk (04/17/2024)   Humiliation, Afraid, Rape, and Kick questionnaire    Fear of Current or Ex-Partner: No    Emotionally Abused: No    Physically Abused: No    Sexually Abused: No    FAMILY HISTORY: Family History  Problem Relation Age of Onset   COPD Mother    Liver cancer Mother     ALLERGIES:  has no known allergies.  MEDICATIONS:  . Current Outpatient Medications on File Prior to Visit  Medication Sig Dispense Refill   acyclovir  (ZOVIRAX ) 400 MG tablet Take 1 tablet (400 mg total) by mouth daily. 90 tablet 6   albuterol  (VENTOLIN  HFA) 108 (90 Base) MCG/ACT inhaler Inhale 2 puffs into the lungs every 6 (six) hours as needed for shortness of breath.     apixaban  (ELIQUIS ) 5 MG TABS tablet Take 1 tablet (5 mg total) by mouth 2 (two) times daily. 60 tablet 5   dexamethasone  (DECADRON ) 4 MG tablet Take 2 tablets (8 mg total) by mouth daily. Take 2 tablets (8 mg total) by mouth daily with food for 3 days after each carbo treatment 24 tablet 2   ferrous sulfate  325 (65 FE) MG EC tablet TAKE 1 TABLET BY MOUTH EVERY DAY WITH BREAKFAST (Patient not taking: No sig reported) 90 tablet 1   Fluticasone Propionate , Inhal, (FLUTICASONE PROPIONATE  DISKUS) 250 MCG/ACT AEPB TAKE 1 PUFF BY MOUTH EVERY DAY (Patient not taking: No sig reported) 60 each 1   folic acid  (FOLVITE ) 1 MG tablet Take 1 tablet (1 mg total) by mouth daily. 30 tablet 4   [Paused] lenalidomide  (REVLIMID ) 10 MG capsule Take 1 capsule (10 mg total) by mouth daily. Take 1 capsule (10 mg total) by mouth daily  for 21 days. Take 7 days off. Repeat. 21 capsule 0   lidocaine -prilocaine  (EMLA ) cream Apply 1 Application topically as needed. Apply to Port-A-Cath at least 1 hr prior to treatment. 30 g 0   Multiple Vitamins-Minerals (MULTIVITAMIN WITH MINERALS) tablet Take 1 tablet by mouth at bedtime.     naproxen  (NAPROSYN ) 375 MG tablet Take 1 tablet (375 mg total) by mouth 2 (two) times daily. 20 tablet 0   nicotine  (NICODERM CQ  - DOSED IN MG/24 HOURS) 14 mg/24hr patch PLACE 1 PATCH (14 MG TOTAL) ONTO THE SKIN DAILY. (Patient not taking: Reported on 04/17/2024) 28 patch 1   ondansetron  (ZOFRAN ) 8 MG  tablet Take 1 tablet (8 mg total) by mouth every 8 (eight) hours as needed for nausea or vomiting. 20 tablet 0   oxyCODONE -acetaminophen  (PERCOCET/ROXICET) 5-325 MG tablet Take 1 tablet by mouth every 6 (six) hours as needed for severe pain (pain score 7-10). 12 tablet 0   prochlorperazine  (COMPAZINE ) 10 MG tablet Take 1 tablet (10 mg total) by mouth every 6 (six) hours as needed for nausea or vomiting. 30 tablet 0   Spacer/Aero-Holding Chambers (AEROCHAMBER MV) inhaler Use as instructed 1 each 0   Vitamin D , Ergocalciferol , (DRISDOL ) 1.25 MG (50000 UNIT) CAPS capsule Take 1 capsule (50,000 Units total) by mouth once a week. (Patient not taking: Reported on 04/17/2024) 12 capsule 6   Current Facility-Administered Medications on File Prior to Visit  Medication Dose Route Frequency Provider Last Rate Last Admin   acetaminophen  (TYLENOL ) 325 MG tablet            famotidine  (PEPCID ) 20 MG tablet             PHYSICAL EXAMINATION:  ECOG FS:1 - Symptomatic but completely ambulatory .BP (!) 155/94 Comment: re-check  Pulse 76   Temp 97.9 F (36.6 C)   Resp 20   Wt 156 lb (70.8 kg)   SpO2 100%   BMI 23.04 kg/m   Wt Readings from Last 3 Encounters:  07/25/24 156 lb (70.8 kg)  07/23/24 150 lb (68 kg)  07/04/24 155 lb (70.3 kg)    GENERAL:alert, in no acute distress and comfortable SKIN: no acute rashes, no  significant lesions EYES: conjunctiva are pink and non-injected, sclera anicteric OROPHARYNX: MMM, no exudates, no oropharyngeal erythema or ulceration NECK: supple, no JVD LYMPH:  no palpable lymphadenopathy in the cervical, axillary or inguinal regions LUNGS: clear to auscultation b/l with normal respiratory effort HEART: regular rate & rhythm ABDOMEN:  normoactive bowel sounds , non tender, not distended. Extremity: no pedal edema PSYCH: alert & oriented x 3 with fluent speech NEURO: no focal motor/sensory deficits    LABORATORY DATA:  I have reviewed the data as listed     Latest Ref Rng & Units 07/25/2024    8:28 AM 07/23/2024   10:54 AM 07/04/2024    7:43 AM  CBC  WBC 4.0 - 10.5 K/uL 9.7  9.8  7.1   Hemoglobin 13.0 - 17.0 g/dL 86.9  87.5  86.2   Hematocrit 39.0 - 52.0 % 37.4  38.5  40.0   Platelets 150 - 400 K/uL 334  271  223    CBC    Component Value Date/Time   WBC 9.7 07/25/2024 0828   WBC 9.8 07/23/2024 1054   RBC 4.16 (L) 07/25/2024 0828   HGB 13.0 07/25/2024 0828   HGB 8.3 (L) 12/14/2017 1230   HCT 37.4 (L) 07/25/2024 0828   HCT 23.9 (L) 12/14/2017 1230   PLT 334 07/25/2024 0828   PLT 524 (H) 12/14/2017 1230   MCV 89.9 07/25/2024 0828   MCV 88.4 12/14/2017 1230   MCH 31.3 07/25/2024 0828   MCHC 34.8 07/25/2024 0828   RDW 14.2 07/25/2024 0828   RDW 15.3 (H) 12/14/2017 1230   LYMPHSABS 2.0 07/25/2024 0828   LYMPHSABS 1.4 12/14/2017 1230   MONOABS 0.9 07/25/2024 0828   MONOABS 0.6 12/14/2017 1230   EOSABS 0.0 07/25/2024 0828   EOSABS 0.1 12/14/2017 1230   BASOSABS 0.1 07/25/2024 0828   BASOSABS 0.1 12/14/2017 1230        Latest Ref Rng & Units 07/25/2024    8:28  AM 07/23/2024   10:54 AM 07/04/2024    7:43 AM  CMP  Glucose 70 - 99 mg/dL 875  99  884   BUN 8 - 23 mg/dL 28  28  25    Creatinine 0.61 - 1.24 mg/dL 8.42  8.63  8.42   Sodium 135 - 145 mmol/L 140  138  142   Potassium 3.5 - 5.1 mmol/L 4.0  4.1  3.7   Chloride 98 - 111 mmol/L 108  106  109    CO2 22 - 32 mmol/L 24  22  24    Calcium 8.9 - 10.3 mg/dL 8.8  9.3  9.1   Total Protein 6.5 - 8.1 g/dL 6.8   7.0   Total Bilirubin 0.0 - 1.2 mg/dL 0.2   0.3   Alkaline Phos 38 - 126 U/L 83   69   AST 15 - 41 U/L 10   9   ALT 0 - 44 U/L 10   7     Guardant 360:          04/29/2024 Cytopathology report:  Fine needle Aspiration, RLL mass - Positive for malignancy, adenocarcinoma Bronchial Brushing, RLL - Positive for malignancy, adenocarcinoma Fine needle aspiration, lymph node station 11L negative for malignancy Fine needle aspiration lymph node 7, station 7, negative for malignancy  03/09/20 ([id number]) [facility] at [facility]   07/02/2019 Bone Marrow Biopsy ([id number]) at [facility]    08/07/18 BM Bx:         RADIOGRAPHIC STUDIES: I have personally reviewed the radiological images as listed and agreed with the findings in the report. DG Femur Min 2 Views Right Result Date: 07/23/2024 CLINICAL DATA:  Right leg pain for 1-2 months. History of multiple myeloma in remission. EXAM: RIGHT FEMUR 2 VIEWS COMPARISON:  Right knee radiographs dated 03/28/2024. Right femur radiographs dated 07/10/2022. FINDINGS: No acute fracture or dislocation. Prior ORIF of the right femur with intact hardware. No significant periprosthetic lucency. Partially evaluated lucent lesion again noted in the proximal fibular diaphysis may relate to underlying multiple myeloma. Mild medial femorotibial compartment joint space narrowing of the knee. Soft tissues are unremarkable. IMPRESSION: 1. No acute osseous abnormality. 2. Prior ORIF of the right femur with intact hardware. 3. Partially evaluated lucent lesion again noted in the proximal fibular diaphysis may relate to underlying multiple myeloma. 4. Mild medial femorotibial compartment joint space narrowing of the knee. Electronically Signed   By: Harrietta Sherry M.D.   On: 07/23/2024 11:59    ASSESSMENT & PLAN:    67 y.o.  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   Initial BM Bx showed >95% involvement with kappa restricted serum free light chains. 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 MoL Cy-   08/07/18 BM Bx revealed normocellular bone marrow with trilineage hematopoiesis and 1% plasma cells which indicates the pt is in remission 08/06/18 PET/CT revealed  Expansile lytic lesion involving the left iliac bone, unchanged. Lytic lesion involving the sternum, unchanged. While both are suspicious for myeloma, neither lesion is FDG avid. No hypermetabolic osseous lesions in the visualized axial and appendicular skeleton. Please note that the bilateral lower extremities were not imaged. No suspicious lymphadenopathy. Spleen is normal in size.  Pt began Ninlaro  3mg  maintenance after pt finished 13 cycles of CyBorD and BM bx showed only 1% plasma cells, then resumed CyBorD and held Ninlaro  in anticipation of his tandem bone marrow transplants beginning in late December 2019 with Dr. Rogue Stallion at [facility]   11/19/18 High dose chemotherapy with Melphalan 140mg /m2 and autologous stem cell rescue  03/20/19 High dose chemotherapy with Melphalan 200mg /m2 and autologous stem cell rescue. Non-infectious diarrhea, uncomplicated course otherwise.  07/02/2019 PET scan with results revealing 1. No FDG avid bone lesions are identified. 2.  Asymmetric uptake within the right, greater than left, palatine tonsils with mild fullness of the right tonsillar soft tissue. ENT consult vs attention on follow up suggested. 3.  Innumerable mixed sclerotic and lytic lesions throughout the axial and appendicular skeleton without associated  hypermetabolic activity. 4.  Ancillary CT findings as above.  07/02/2019 bone marrow biopsy ([id number])with results showing: Trilineage hematopoieses with maturation. No plasma cell myeloma identified. Mild anemia and No Rouleaux or circulating plasma cells identified in the peripheral blood.  2) s/p Prophylactic IM nailing for rt femur  completed Physical Therapy at the [facility].  -patient previously reported improvement and is currently progressively back to full time work.  3) Thrombocytopenia resolved   4) Renal insufficiency --likely related to multiple myeloma.  Remained fairly stable with baseline creatinine of 1.5-2.1.  Creatinine is 1.44 today Plan -maintain follow up with nephrology referral to Dr Rayburn [facility] for continued treatment -Pt completed 8 weeks of maverick for hep c with Dr Efrain on 07/08/18.  -Kidney numbers improved. Creatinine in the 1.7-2.2 range   5) h/o tobacco abuse-counseled on smoking cessation -Currently not smoking.   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 Hemoglobin is stable at 12.8  8) Newly diagnosed lung adenocarcinoma  PLAN:  -Guardant 360 test results unfortunately shows that he does not have any of the 8 targetable mutations to allow for targeted therapy -myeloma labs show that his light chains are completely suppressed. We discussed that his myeloma is primarily making kappa light chains and kappa free light chains remain normal at 15.2 mg/L. Discussed that his myeloma panel shows a small amount of M protein at 0.3 g/dL, which is likely the body's reaction to a solid tumor related to lung cancer and less likely to suggest myeloma progression, though this would need to be monitored.  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  -did not feel any enlarged lymph nodes  during physical examination -recommend to proceed with chemoimmunotherapy to treat his lung cancer at this point for at least 3-4 cycles and then evaluate his response.  -would not recommend surgery or radiation upfront, since it may not address aggressive element and disease can later present in other areas. Also discussed concern that with surgery, there would be a role to hold myeloma treatment for several months with longer rehab period.  -discussed that if his chemoimmunotherapy is quite effective, at that point, we shall decide based on myeloma status and response to treatment for lung cancer, whether to proceed with surgery or radiation. We discussed that there may also still be immunotherapy for lung cancer after that.  -we will continue Revlimid  maintenance for myeloma at this time. -we will hold carfilzomab at this time for myeloma.  -given that his risk profile has changed for blood clots  given his lung cancer, we will switch aspirin  to eliquis  to provide more protection in regards to reducing the risk of blood clots -discussed that treatment does suppress his immune system -discussed that there will be a role for a shot to boost WBCs to reduce the chance of infections -recommend taking infection precautions including avoiding too much social contact with crowds -start B12 shots to reduce the risk of mouth sores -start folic acid  -recommend physical activity at level of walking briskly for 30 minutes a day  -discussed that it would be reasonable to take some time off of work if needed from a fatigue standpoint -patient is agreeable to starting treatment in 1 week -will plan to start treatment in 1 week -answered all of patient's questions in detail  FOLLOW-UP: Carbo/pepm/pem neoadjuvant chemotherapy to start ASAP Portflush/labs and MD visit with C1D14 for toxicitiy check  The total time spent in the appointment was 40 minutes* .  All of the patient's questions were answered with  apparent satisfaction. The patient knows to call the clinic with any problems, questions or concerns.   Emaline Saran MD MS AAHIVMS [facility] Hematology/Oncology Physician [facility]  .*Total Encounter Time as defined by the Centers for Medicare and Medicaid Services includes, in addition to the face-to-face time of a patient visit (documented in the note above) non-face-to-face time: obtaining and reviewing outside history, ordering and reviewing medications, tests or procedures, care coordination (communications with other health care professionals or caregivers) and documentation in the medical record.    I,Mitra Faeizi,acting as a Neurosurgeon for Emaline Saran, MD.,have documented all relevant documentation on the behalf of Emaline Saran, MD,as directed by  Emaline Saran, MD while in the presence of Emaline Saran, MD.  .I have reviewed the above documentation for accuracy and completeness, and I agree with the above. .Kendrix Orman Kishore Geralyn Figiel MD

## 2024-07-27 ENCOUNTER — Encounter (HOSPITAL_COMMUNITY): Payer: Self-pay

## 2024-07-27 ENCOUNTER — Other Ambulatory Visit: Payer: Self-pay

## 2024-07-28 ENCOUNTER — Inpatient Hospital Stay

## 2024-07-28 VITALS — BP 96/62 | HR 77 | Resp 16

## 2024-07-28 DIAGNOSIS — C3490 Malignant neoplasm of unspecified part of unspecified bronchus or lung: Secondary | ICD-10-CM

## 2024-07-28 DIAGNOSIS — Z5111 Encounter for antineoplastic chemotherapy: Secondary | ICD-10-CM | POA: Diagnosis not present

## 2024-07-28 DIAGNOSIS — Z7189 Other specified counseling: Secondary | ICD-10-CM

## 2024-07-28 LAB — KAPPA/LAMBDA LIGHT CHAINS
Kappa free light chain: 14.8 mg/L (ref 3.3–19.4)
Kappa, lambda light chain ratio: 1.14 (ref 0.26–1.65)
Lambda free light chains: 13 mg/L (ref 5.7–26.3)

## 2024-07-28 MED ORDER — PEGFILGRASTIM-JMDB 6 MG/0.6ML ~~LOC~~ SOSY
6.0000 mg | PREFILLED_SYRINGE | Freq: Once | SUBCUTANEOUS | Status: AC
  Administered 2024-07-28: 6 mg via SUBCUTANEOUS
  Filled 2024-07-28: qty 0.6

## 2024-07-29 ENCOUNTER — Ambulatory Visit (HOSPITAL_COMMUNITY)
Admission: RE | Admit: 2024-07-29 | Discharge: 2024-07-29 | Disposition: A | Discharge status: Home / Self Care | Source: Ambulatory Visit | Attending: Orthopedic Surgery | Admitting: Orthopedic Surgery

## 2024-07-29 DIAGNOSIS — M79604 Pain in right leg: Secondary | ICD-10-CM | POA: Insufficient documentation

## 2024-07-29 LAB — MULTIPLE MYELOMA PANEL, SERUM
Albumin SerPl Elph-Mcnc: 3.4 g/dL (ref 2.9–4.4)
Albumin/Glob SerPl: 1.2 (ref 0.7–1.7)
Alpha 1: 0.3 g/dL (ref 0.0–0.4)
Alpha2 Glob SerPl Elph-Mcnc: 1.2 g/dL — ABNORMAL HIGH (ref 0.4–1.0)
B-Globulin SerPl Elph-Mcnc: 0.9 g/dL (ref 0.7–1.3)
Gamma Glob SerPl Elph-Mcnc: 0.7 g/dL (ref 0.4–1.8)
Globulin, Total: 3 g/dL (ref 2.2–3.9)
IgA: 80 mg/dL (ref 61–437)
IgG (Immunoglobin G), Serum: 803 mg/dL (ref 603–1613)
IgM (Immunoglobulin M), Srm: <5 mg/dL — ABNORMAL LOW (ref 20–172)
M Protein SerPl Elph-Mcnc: 0.3 g/dL — ABNORMAL HIGH
Total Protein ELP: 6.4 g/dL (ref 6.0–8.5)

## 2024-07-29 MED ORDER — GADOBUTROL 1 MMOL/ML IV SOLN
7.0000 mL | Freq: Once | INTRAVENOUS | Status: AC | PRN
  Administered 2024-07-29: 7 mL via INTRAVENOUS

## 2024-07-30 ENCOUNTER — Other Ambulatory Visit: Payer: Self-pay

## 2024-08-01 ENCOUNTER — Other Ambulatory Visit (HOSPITAL_COMMUNITY): Payer: Self-pay

## 2024-08-01 ENCOUNTER — Encounter: Payer: Self-pay | Admitting: Hematology

## 2024-08-01 ENCOUNTER — Other Ambulatory Visit: Payer: Self-pay

## 2024-08-01 MED ORDER — NAPROXEN 375 MG PO TABS
375.0000 mg | ORAL_TABLET | Freq: Two times a day (BID) | ORAL | 0 refills | Status: AC
  Filled 2024-08-01: qty 20, 10d supply, fill #0

## 2024-08-01 MED ORDER — APIXABAN 5 MG PO TABS
5.0000 mg | ORAL_TABLET | Freq: Two times a day (BID) | ORAL | 5 refills | Status: DC
  Filled 2024-08-01: qty 60, 30d supply, fill #0

## 2024-08-01 MED ORDER — OXYCODONE-ACETAMINOPHEN 5-325 MG PO TABS
1.0000 | ORAL_TABLET | Freq: Four times a day (QID) | ORAL | 0 refills | Status: DC | PRN
  Filled 2024-08-01: qty 30, 8d supply, fill #0

## 2024-08-04 ENCOUNTER — Other Ambulatory Visit (HOSPITAL_COMMUNITY): Payer: Self-pay

## 2024-08-04 ENCOUNTER — Other Ambulatory Visit: Payer: Self-pay

## 2024-08-10 ENCOUNTER — Other Ambulatory Visit: Payer: Self-pay

## 2024-08-10 ENCOUNTER — Emergency Department (HOSPITAL_COMMUNITY)
Admission: EM | Admit: 2024-08-10 | Discharge: 2024-08-10 | Disposition: A | Discharge status: Home / Self Care | Attending: Emergency Medicine | Admitting: Emergency Medicine

## 2024-08-10 DIAGNOSIS — Z8579 Personal history of other malignant neoplasms of lymphoid, hematopoietic and related tissues: Secondary | ICD-10-CM | POA: Insufficient documentation

## 2024-08-10 DIAGNOSIS — F17200 Nicotine dependence, unspecified, uncomplicated: Secondary | ICD-10-CM | POA: Insufficient documentation

## 2024-08-10 DIAGNOSIS — M79604 Pain in right leg: Secondary | ICD-10-CM | POA: Diagnosis present

## 2024-08-10 DIAGNOSIS — Z7901 Long term (current) use of anticoagulants: Secondary | ICD-10-CM | POA: Insufficient documentation

## 2024-08-10 MED ORDER — OXYCODONE-ACETAMINOPHEN 5-325 MG PO TABS
1.0000 | ORAL_TABLET | Freq: Once | ORAL | Status: AC
  Administered 2024-08-10: 1 via ORAL
  Filled 2024-08-10: qty 1

## 2024-08-10 MED ORDER — METHOCARBAMOL 500 MG PO TABS: 500.0000 mg | ORAL_TABLET | Freq: Two times a day (BID) | ORAL | 0 refills | Status: DC

## 2024-08-10 MED ORDER — OXYCODONE-ACETAMINOPHEN 5-325 MG PO TABS
1.0000 | ORAL_TABLET | Freq: Four times a day (QID) | ORAL | 0 refills | Status: DC | PRN
Start: 2024-08-10 — End: 2024-11-03

## 2024-08-10 MED ORDER — KETOROLAC TROMETHAMINE 15 MG/ML IJ SOLN
15.0000 mg | Freq: Once | INTRAMUSCULAR | Status: AC
  Administered 2024-08-10: 15 mg via INTRAMUSCULAR
  Filled 2024-08-10: qty 1

## 2024-08-10 NOTE — Discharge Instructions (Addendum)
 Please use Tylenol  or ibuprofen  for pain.  You may use 600 mg ibuprofen  every 6 hours or 1000 mg of Tylenol  every 6 hours.  You may choose to alternate between the 2.  This would be most effective.  Not to exceed 4 g of Tylenol  within 24 hours.  Not to exceed 3200 mg ibuprofen  24 hours.  You can use the muscle relaxant I am prescribing in addition to the above to help with any breakthrough pain.  You can take it up to twice daily.  It is safe to take at night, but I would be cautious taking it during the day as it can cause some drowsiness.  Make sure that you are feeling awake and alert before you get behind the wheel of a car or operate a motor vehicle.  It is not a narcotic pain medication so you are able to take it if it is not making you drowsy and still pilot a vehicle or machinery safely.  You can use the stronger narcotic pain medication in place of Tylenol  for severe break through pain.  If you take the narcotic pain medication that we prescribed recommend that you also take a laxative such as MiraLAX  or Dulcolax every day that you take the narcotic pain medicine, and drink plenty of fluids, 50 to 64 ounces to prevent any constipation.  As we discussed we do not routinely prescribe stronger pain medication than oxycodone , nor long term prescriptions for pain medication in the emergency department, if the oxycodone  that you have been prescribed by your oncologist is not managing your pain then you need to reach out to ask them whether or not it would be possible to put you on any stronger pain medicine.

## 2024-08-10 NOTE — ED Provider Notes (Signed)
 [place] EMERGENCY DEPARTMENT AT [facility] Provider Note   CSN: [id number] Arrival date & time: 08/10/24  [id number]     Patient presents with: Leg Pain (Right)   Andres Fernandez is a 68 y.o. male with past medical history significant for multiple myeloma in remission, tobacco abuse, chronic right leg pain who presents with concern for acute on chronic right leg pain.  Patient has been seen previously, negative x-ray from evaluation earlier this month, he followed up with orthopedics and had an MRI which showed no stress fracture or other emergent cause.  Suspicious that he has some chronic arthritis.  He denies any new fall, injury, redness, swelling.  He reports no improvement with home narcotic pain medication, Tylenol .    Leg Pain      Prior to Admission medications   Medication Sig Start Date End Date Taking? Authorizing Provider  methocarbamol  (ROBAXIN ) 500 MG tablet Take 1 tablet (500 mg total) by mouth 2 (two) times daily. 08/10/24  Yes Delara Shepheard H, PA-C  oxyCODONE -acetaminophen  (PERCOCET/ROXICET) 5-325 MG tablet Take 1 tablet by mouth every 6 (six) hours as needed for severe pain (pain score 7-10). 08/10/24  Yes Emalina Dubreuil H, PA-C  acyclovir  (ZOVIRAX ) 400 MG tablet Take 1 tablet (400 mg total) by mouth daily. 08/03/23   [name], MD  albuterol  (VENTOLIN  HFA) 108 [phone] Base) MCG/ACT inhaler Inhale 2 puffs into the lungs every 6 (six) hours as needed for shortness of breath. 04/02/24   [provider]  apixaban  (ELIQUIS ) 5 MG TABS tablet Take 1 tablet (5 mg total) by mouth 2 (two) times daily. 08/01/24   [name], MD  dexamethasone  (DECADRON ) 4 MG tablet Take 2 tablets (8 mg total) by mouth daily. Take 2 tablets (8 mg total) by mouth daily with food for 3 days after each carbo treatment 07/04/24   [name], MD  ferrous sulfate  325 (65 FE) MG EC tablet TAKE 1 TABLET BY MOUTH EVERY DAY WITH BREAKFAST Patient not  taking: Reported on 07/25/2024 02/20/23   Thayil, Irene T, PA-C  Fluticasone Propionate , Inhal, (FLUTICASONE PROPIONATE  DISKUS) 250 MCG/ACT AEPB TAKE 1 PUFF BY MOUTH EVERY DAY Patient not taking: Reported on 07/25/2024 09/28/23   [name], MD  folic acid  (FOLVITE ) 1 MG tablet Take 1 tablet (1 mg total) by mouth daily. 07/04/24   [name], MD  lenalidomide  (REVLIMID ) 10 MG capsule Take 1 capsule (10 mg total) by mouth daily. Take 1 capsule (10 mg total) by mouth daily for 21 days. Take 7 days off. Repeat. 07/25/24   [name], MD  lidocaine -prilocaine  (EMLA ) cream Apply 1 Application topically as needed. Apply to Port-A-Cath at least 1 hr prior to treatment. 01/18/24   Kale, Gautam Kishore, MD  Multiple Vitamins-Minerals (MULTIVITAMIN WITH MINERALS) tablet Take 1 tablet by mouth at bedtime.    [provider]  naproxen  (NAPROSYN ) 375 MG tablet Take 1 tablet (375 mg total) by mouth 2 (two) times daily. 08/01/24   [name], MD  nicotine  (NICODERM CQ  - DOSED IN MG/24 HOURS) 14 mg/24hr patch PLACE 1 PATCH (14 MG TOTAL) ONTO THE SKIN DAILY. Patient not taking: Reported on 07/25/2024 09/28/23   [name], MD  ondansetron  (ZOFRAN ) 8 MG tablet Take 1 tablet (8 mg total) by mouth every 8 (eight) hours as needed for nausea or vomiting. 07/04/24   [name], MD  prochlorperazine  (COMPAZINE ) 10 MG tablet Take 1 tablet (10 mg total)  by mouth every 6 (six) hours as needed for nausea or vomiting. 07/04/24   [name], MD  Spacer/Aero-Holding Chambers (AEROCHAMBER MV) inhaler Use as instructed 04/23/24   Isadora Hose, MD  Vitamin D , Ergocalciferol , (DRISDOL ) 1.25 MG (50000 UNIT) CAPS capsule Take 1 capsule (50,000 Units total) by mouth once a week. Patient not taking: Reported on 07/25/2024 09/28/23   [name], MD    Allergies: Patient has no known allergies.    Review of Systems  All other systems reviewed and are  negative.   Updated Vital Signs BP (!) 149/90 (BP Location: Left Arm)   Pulse 77   Temp 97.8 F (36.6 C) (Oral)   Ht 5' 8 (1.727 m)   Wt 71.2 kg   SpO2 99%   BMI 23.87 kg/m   Physical Exam Vitals and nursing note reviewed.  Constitutional:      General: He is not in acute distress.    Appearance: Normal appearance.  HENT:     Head: Normocephalic and atraumatic.  Eyes:     General:        Right eye: No discharge.        Left eye: No discharge.  Cardiovascular:     Rate and Rhythm: Normal rate and regular rhythm.     Pulses: Normal pulses.  Pulmonary:     Effort: Pulmonary effort is normal. No respiratory distress.  Musculoskeletal:        General: No deformity.     Comments: Tenderness palpation throughout the right lower extremity, no focal step-off, deformity, swelling.  Normal range of motion at ankle, knee, hip.  Some pain with flexion at the hip, strength 4/5.,  Suspicious secondary to effort, patient can ambulate with assistance.  Skin:    General: Skin is warm and dry.     Capillary Refill: Capillary refill takes less than 2 seconds.  Neurological:     Mental Status: He is alert and oriented to person, place, and time.  Psychiatric:        Mood and Affect: Mood normal.        Behavior: Behavior normal.     (all labs ordered are listed, but only abnormal results are displayed) Labs Reviewed - No data to display  EKG: None  Radiology: No results found.   Procedures   Medications Ordered in the ED  ketorolac  (TORADOL ) 15 MG/ML injection 15 mg (has no administration in time range)  oxyCODONE -acetaminophen  (PERCOCET/ROXICET) 5-325 MG per tablet 1 tablet (has no administration in time range)                                    Medical Decision Making Risk Prescription drug management.   This patient is a 68 y.o. male who presents to the ED for concern of right leg pain.   Differential diagnoses prior to evaluation: Acute fracture, dislocation,  cellulitis, DVT, versus other  Past Medical History / Social History / Additional history: Chart reviewed. Pertinent results include: multiple myeloma in remission, tobacco abuse, chronic right leg pain  Physical Exam: Physical exam performed. The pertinent findings include: Tenderness palpation throughout the right lower extremity, no focal step-off, deformity, swelling.  Normal range of motion at ankle, knee, hip.  Some pain with flexion at the hip, strength 4/5.,  Suspicious secondary to effort, patient can ambulate with assistance.  Neurovascularly intact throughout.  Medications / Treatment: Given 1 dose of pain  medicine, oxycodone , Toradol  in the emergency department, discussed with patient that with no signs of neurovascular compromise I do not think that there is good clinical indication for additional workup in the emergency department today given his recent MRI and lack of any new injury.  He is requesting a refill of his pain medication, reporting that he has gone through the 30 oxycodone  that was sent 2 weeks ago already.  I discussed that given that it is Sunday and a holiday tomorrow I would send a very short prescription until he can follow-up with his appropriate orthopedic and oncology physicians.  I discussed that we typically do not manage long-term narcotic prescriptions from the emergency department.   Disposition: After consideration of the diagnostic results and the patients response to treatment, I feel that patient stable for discharge with plan as above .   emergency department workup does not suggest an emergent condition requiring admission or immediate intervention beyond what has been performed at this time. The plan is: as above. The patient is safe for discharge and has been instructed to return immediately for worsening symptoms, change in symptoms or any other concerns.   Final diagnoses:  Right leg pain    ED Discharge Orders          Ordered     methocarbamol (ROBAXIN) 500 MG tablet  2 times daily        08/10/24 0750    oxyCODONE-acetaminophen (PERCOCET/ROXICET) 5-325 MG tablet  Every 6 hours PRN        08 /31/25 0750               Rosan Sherlean DEL, PA-C 08/10/24 [id number]    Francesca Elsie CROME, MD 08/10/24 [phone]

## 2024-08-10 NOTE — ED Triage Notes (Incomplete)
 Patient to ED by POV with c/o right leg pain voces HX of arthritis in R leg received steroid injection Wednesday relief for 24 hrs. Pain radiates from right hip to calf. He is being seen by Ortho and had a MRI on leg.

## 2024-08-12 ENCOUNTER — Other Ambulatory Visit (HOSPITAL_COMMUNITY): Payer: Self-pay

## 2024-08-14 ENCOUNTER — Other Ambulatory Visit: Payer: Self-pay

## 2024-08-14 MED FILL — Fosaprepitant Dimeglumine For IV Infusion 150 MG (Base Eq): INTRAVENOUS | Qty: 5 | Status: AC

## 2024-08-15 ENCOUNTER — Inpatient Hospital Stay: Attending: Hematology

## 2024-08-15 ENCOUNTER — Inpatient Hospital Stay

## 2024-08-15 ENCOUNTER — Inpatient Hospital Stay (HOSPITAL_BASED_OUTPATIENT_CLINIC_OR_DEPARTMENT_OTHER): Admitting: Hematology

## 2024-08-15 VITALS — BP 133/78 | HR 86 | Temp 97.7°F | Resp 16 | Wt 147.5 lb

## 2024-08-15 DIAGNOSIS — Z800 Family history of malignant neoplasm of digestive organs: Secondary | ICD-10-CM | POA: Insufficient documentation

## 2024-08-15 DIAGNOSIS — Z95828 Presence of other vascular implants and grafts: Secondary | ICD-10-CM

## 2024-08-15 DIAGNOSIS — Z7189 Other specified counseling: Secondary | ICD-10-CM

## 2024-08-15 DIAGNOSIS — F17210 Nicotine dependence, cigarettes, uncomplicated: Secondary | ICD-10-CM | POA: Diagnosis not present

## 2024-08-15 DIAGNOSIS — C9011 Plasma cell leukemia in remission: Secondary | ICD-10-CM | POA: Diagnosis not present

## 2024-08-15 DIAGNOSIS — D630 Anemia in neoplastic disease: Secondary | ICD-10-CM | POA: Insufficient documentation

## 2024-08-15 DIAGNOSIS — C3431 Malignant neoplasm of lower lobe, right bronchus or lung: Secondary | ICD-10-CM | POA: Diagnosis not present

## 2024-08-15 DIAGNOSIS — C9000 Multiple myeloma not having achieved remission: Secondary | ICD-10-CM

## 2024-08-15 DIAGNOSIS — Z79620 Long term (current) use of immunosuppressive biologic: Secondary | ICD-10-CM | POA: Insufficient documentation

## 2024-08-15 DIAGNOSIS — C3490 Malignant neoplasm of unspecified part of unspecified bronchus or lung: Secondary | ICD-10-CM

## 2024-08-15 DIAGNOSIS — C9001 Multiple myeloma in remission: Secondary | ICD-10-CM | POA: Diagnosis not present

## 2024-08-15 DIAGNOSIS — Z5112 Encounter for antineoplastic immunotherapy: Secondary | ICD-10-CM | POA: Diagnosis not present

## 2024-08-15 DIAGNOSIS — Z5111 Encounter for antineoplastic chemotherapy: Secondary | ICD-10-CM | POA: Diagnosis present

## 2024-08-15 DIAGNOSIS — M549 Dorsalgia, unspecified: Secondary | ICD-10-CM | POA: Insufficient documentation

## 2024-08-15 DIAGNOSIS — Z5189 Encounter for other specified aftercare: Secondary | ICD-10-CM | POA: Insufficient documentation

## 2024-08-15 DIAGNOSIS — N189 Chronic kidney disease, unspecified: Secondary | ICD-10-CM | POA: Diagnosis not present

## 2024-08-15 DIAGNOSIS — C9010 Plasma cell leukemia not having achieved remission: Secondary | ICD-10-CM

## 2024-08-15 DIAGNOSIS — C3491 Malignant neoplasm of unspecified part of right bronchus or lung: Secondary | ICD-10-CM | POA: Diagnosis not present

## 2024-08-15 DIAGNOSIS — M84551A Pathological fracture in neoplastic disease, right femur, initial encounter for fracture: Secondary | ICD-10-CM

## 2024-08-15 LAB — CMP (CANCER CENTER ONLY)
ALT: 17 U/L (ref 0–44)
AST: 10 U/L — ABNORMAL LOW (ref 15–41)
Albumin: 3.5 g/dL (ref 3.5–5.0)
Alkaline Phosphatase: 117 U/L (ref 38–126)
Anion gap: 8 (ref 5–15)
BUN: 26 mg/dL — ABNORMAL HIGH (ref 8–23)
CO2: 22 mmol/L (ref 22–32)
Calcium: 8.8 mg/dL — ABNORMAL LOW (ref 8.9–10.3)
Chloride: 108 mmol/L (ref 98–111)
Creatinine: 1.28 mg/dL — ABNORMAL HIGH (ref 0.61–1.24)
GFR, Estimated: >60 mL/min (ref >60)
Glucose, Bld: 104 mg/dL — ABNORMAL HIGH (ref 70–99)
Potassium: 4 mmol/L (ref 3.5–5.1)
Sodium: 138 mmol/L (ref 135–145)
Total Bilirubin: 0.2 mg/dL (ref 0.0–1.2)
Total Protein: 7.1 g/dL (ref 6.5–8.1)

## 2024-08-15 LAB — CBC WITH DIFFERENTIAL (CANCER CENTER ONLY)
Abs Immature Granulocytes: 0.4 K/uL — ABNORMAL HIGH (ref 0.00–0.07)
Basophils Absolute: 0.1 K/uL (ref 0.0–0.1)
Basophils Relative: 0 %
Eosinophils Absolute: 0 K/uL (ref 0.0–0.5)
Eosinophils Relative: 0 %
HCT: 30.2 % — ABNORMAL LOW (ref 39.0–52.0)
Hemoglobin: 10.4 g/dL — ABNORMAL LOW (ref 13.0–17.0)
Immature Granulocytes: 3 %
Lymphocytes Relative: 15 %
Lymphs Abs: 2.1 K/uL (ref 0.7–4.0)
MCH: 30.4 pg (ref 26.0–34.0)
MCHC: 34.4 g/dL (ref 30.0–36.0)
MCV: 88.3 fL (ref 80.0–100.0)
Monocytes Absolute: 2.1 K/uL — ABNORMAL HIGH (ref 0.1–1.0)
Monocytes Relative: 15 %
Neutro Abs: 8.9 K/uL — ABNORMAL HIGH (ref 1.7–7.7)
Neutrophils Relative %: 67 %
Platelet Count: 326 K/uL (ref 150–400)
RBC: 3.42 MIL/uL — ABNORMAL LOW (ref 4.22–5.81)
RDW: 15.1 % (ref 11.5–15.5)
WBC Count: 13.5 K/uL — ABNORMAL HIGH (ref 4.0–10.5)
nRBC: 0 % (ref 0.0–0.2)

## 2024-08-15 LAB — TSH: TSH: 1.47 u[IU]/mL (ref 0.350–4.500)

## 2024-08-15 MED ORDER — ACETAMINOPHEN 500 MG PO TABS
1000.0000 mg | ORAL_TABLET | Freq: Once | ORAL | Status: AC
  Administered 2024-08-15: 1000 mg via ORAL
  Filled 2024-08-15: qty 2

## 2024-08-15 MED ORDER — GABAPENTIN 100 MG PO CAPS: 200.0000 mg | ORAL_CAPSULE | Freq: Three times a day (TID) | ORAL | 1 refills | Status: DC

## 2024-08-15 MED ORDER — SODIUM CHLORIDE 0.9 % IV SOLN
150.0000 mg | Freq: Once | INTRAVENOUS | Status: AC
  Administered 2024-08-15: 150 mg via INTRAVENOUS
  Filled 2024-08-15: qty 150

## 2024-08-15 MED ORDER — SODIUM CHLORIDE 0.9% FLUSH
10.0000 mL | Freq: Once | INTRAVENOUS | Status: AC
  Administered 2024-08-15: 10 mL

## 2024-08-15 MED ORDER — APIXABAN 5 MG PO TABS: 5.0000 mg | ORAL_TABLET | Freq: Two times a day (BID) | ORAL | 5 refills | Status: AC

## 2024-08-15 MED ORDER — SODIUM CHLORIDE 0.9% FLUSH: 10.0000 mL | INTRAVENOUS | Status: DC | PRN

## 2024-08-15 MED ORDER — SODIUM CHLORIDE 0.9 % IV SOLN
350.0000 mg | Freq: Once | INTRAVENOUS | Status: AC
  Administered 2024-08-15: 350 mg via INTRAVENOUS
  Filled 2024-08-15: qty 35

## 2024-08-15 MED ORDER — PALONOSETRON HCL INJECTION 0.25 MG/5ML
0.2500 mg | Freq: Once | INTRAVENOUS | Status: AC
  Administered 2024-08-15: 0.25 mg via INTRAVENOUS
  Filled 2024-08-15: qty 5

## 2024-08-15 MED ORDER — DEXAMETHASONE SODIUM PHOSPHATE 10 MG/ML IJ SOLN
10.0000 mg | Freq: Once | INTRAMUSCULAR | Status: AC
  Administered 2024-08-15: 10 mg via INTRAVENOUS
  Filled 2024-08-15: qty 1

## 2024-08-15 MED ORDER — SODIUM CHLORIDE 0.9 % IV SOLN: INTRAVENOUS | Status: DC

## 2024-08-15 MED ORDER — SODIUM CHLORIDE 0.9 % IV SOLN
500.0000 mg/m2 | Freq: Once | INTRAVENOUS | Status: AC
  Administered 2024-08-15: 900 mg via INTRAVENOUS
  Filled 2024-08-15: qty 20

## 2024-08-15 MED ORDER — ACYCLOVIR 400 MG PO TABS
400.0000 mg | ORAL_TABLET | Freq: Every day | ORAL | 6 refills | Status: AC
Start: 2024-08-15 — End: ?

## 2024-08-15 MED ORDER — SODIUM CHLORIDE 0.9 % IV SOLN
200.0000 mg | Freq: Once | INTRAVENOUS | Status: AC
  Administered 2024-08-15: 200 mg via INTRAVENOUS
  Filled 2024-08-15: qty 200

## 2024-08-15 MED ORDER — CYANOCOBALAMIN 1000 MCG/ML IJ SOLN
1000.0000 ug | Freq: Once | INTRAMUSCULAR | Status: AC
  Administered 2024-08-15: 1000 ug via INTRAMUSCULAR
  Filled 2024-08-15: qty 1

## 2024-08-15 NOTE — Patient Instructions (Signed)
 [facility] - A DEPT OF [facility]  Discharge Instructions: Thank you for choosing [facility] to provide your oncology and hematology care.   If you have a lab appointment with the Cancer Center, please go directly to the Cancer Center and check in at the registration area.   Wear comfortable clothing and clothing appropriate for easy access to any Portacath or PICC line.   We strive to give you quality time with your provider. You may need to reschedule your appointment if you arrive late (15 or more minutes).  Arriving late affects you and other patients whose appointments are after yours.  Also, if you miss three or more appointments without notifying the office, you may be dismissed from the clinic at the provider's discretion.      For prescription refill requests, have your pharmacy contact our office and allow 72 hours for refills to be completed.    Today you received the following chemotherapy and/or immunotherapy agents Keytruda, Aliminta, Carboplatin      To help prevent nausea and vomiting after your treatment, we encourage you to take your nausea medication as directed.  BELOW ARE SYMPTOMS THAT SHOULD BE REPORTED IMMEDIATELY: *FEVER GREATER THAN 100.4 F (38 C) OR HIGHER *CHILLS OR SWEATING *NAUSEA AND VOMITING THAT IS NOT CONTROLLED WITH YOUR NAUSEA MEDICATION *UNUSUAL SHORTNESS OF BREATH *UNUSUAL BRUISING OR BLEEDING *URINARY PROBLEMS (pain or burning when urinating, or frequent urination) *BOWEL PROBLEMS (unusual diarrhea, constipation, pain near the anus) TENDERNESS IN MOUTH AND THROAT WITH OR WITHOUT PRESENCE OF ULCERS (sore throat, sores in mouth, or a toothache) UNUSUAL RASH, SWELLING OR PAIN  UNUSUAL VAGINAL DISCHARGE OR ITCHING   Items with * indicate a potential emergency and should be followed up as soon as possible or go to the Emergency Department if any problems should occur.  Please show the CHEMOTHERAPY ALERT  CARD or IMMUNOTHERAPY ALERT CARD at check-in to the Emergency Department and triage nurse.  Should you have questions after your visit or need to cancel or reschedule your appointment, please contact [facility] - A DEPT OF [facility]  Dept: [phone]  and follow the prompts.  Office hours are 8:00 a.m. to 4:30 p.m. Monday - Friday. Please note that voicemails left after 4:00 p.m. may not be returned until the following business day.  We are closed weekends and major holidays. You have access to a nurse at all times for urgent questions. Please call the main number to the clinic Dept: [phone] and follow the prompts.   For any non-urgent questions, you may also contact your provider using MyChart. We now offer e-Visits for anyone 32 and older to request care online for non-urgent symptoms. For details visit [email or web address].   Also download the MyChart app! Go to the app store, search MyChart, open the app, select [place], and log in with your MyChart username and password.

## 2024-08-15 NOTE — Progress Notes (Signed)
 Keep carboplatin  dose at 350 mg today per Dr. Onesimo.  Harlene Nasuti, PharmD Oncology Infusion Pharmacist 08/15/2024 2:02 PM

## 2024-08-16 ENCOUNTER — Other Ambulatory Visit: Payer: Self-pay

## 2024-08-16 LAB — T4: T4, Total: 7.1 ug/dL (ref 4.5–12.0)

## 2024-08-17 ENCOUNTER — Other Ambulatory Visit: Payer: Self-pay

## 2024-08-18 ENCOUNTER — Other Ambulatory Visit: Payer: Self-pay

## 2024-08-18 ENCOUNTER — Inpatient Hospital Stay

## 2024-08-18 ENCOUNTER — Ambulatory Visit

## 2024-08-18 VITALS — BP 122/74 | HR 82 | Temp 98.4°F | Resp 16

## 2024-08-18 DIAGNOSIS — C9010 Plasma cell leukemia not having achieved remission: Secondary | ICD-10-CM

## 2024-08-18 DIAGNOSIS — Z7189 Other specified counseling: Secondary | ICD-10-CM

## 2024-08-18 DIAGNOSIS — Z5111 Encounter for antineoplastic chemotherapy: Secondary | ICD-10-CM | POA: Diagnosis not present

## 2024-08-18 DIAGNOSIS — C3490 Malignant neoplasm of unspecified part of unspecified bronchus or lung: Secondary | ICD-10-CM

## 2024-08-18 LAB — KAPPA/LAMBDA LIGHT CHAINS
Kappa free light chain: 19.9 mg/L — ABNORMAL HIGH (ref 3.3–19.4)
Kappa, lambda light chain ratio: 0.97 (ref 0.26–1.65)
Lambda free light chains: 20.6 mg/L (ref 5.7–26.3)

## 2024-08-18 MED ORDER — LENALIDOMIDE 10 MG PO CAPS: 10.0000 mg | ORAL_CAPSULE | Freq: Every day | ORAL | 0 refills | Status: AC

## 2024-08-18 MED ORDER — PEGFILGRASTIM-JMDB 6 MG/0.6ML ~~LOC~~ SOSY
6.0000 mg | PREFILLED_SYRINGE | Freq: Once | SUBCUTANEOUS | Status: AC
  Administered 2024-08-18: 6 mg via SUBCUTANEOUS
  Filled 2024-08-18: qty 0.6

## 2024-08-19 ENCOUNTER — Ambulatory Visit (INDEPENDENT_AMBULATORY_CARE_PROVIDER_SITE_OTHER): Admitting: Physician Assistant

## 2024-08-19 ENCOUNTER — Other Ambulatory Visit: Payer: Self-pay

## 2024-08-19 ENCOUNTER — Other Ambulatory Visit (INDEPENDENT_AMBULATORY_CARE_PROVIDER_SITE_OTHER): Payer: Self-pay

## 2024-08-19 ENCOUNTER — Encounter: Payer: Self-pay | Admitting: Physician Assistant

## 2024-08-19 DIAGNOSIS — M545 Low back pain, unspecified: Secondary | ICD-10-CM

## 2024-08-19 DIAGNOSIS — G8929 Other chronic pain: Secondary | ICD-10-CM

## 2024-08-19 NOTE — Addendum Note (Signed)
 Addended by: RODGERS LACY on: 08/19/2024 02:56 PM   Modules accepted: Orders

## 2024-08-19 NOTE — Progress Notes (Signed)
 Office Visit Note   Patient: Andres Fernandez           Date of Birth: 04-10-1956           MRN: [id number] Visit Date: 08/19/2024              Requested by: Dia Vina HERO, NP-C [address] [place],  [place] [address] PCP: Dia Vina HERO, NP-C   Assessment & Plan: Visit Diagnoses:  1. Chronic bilateral low back pain without sciatica     Plan: Patient is a pleasant 69 year old gentleman who is in remission from multiple myeloma and adenocarcinoma he is followed closely by oncology and has a upcoming PET scan.  He did have an MRI of his right tib-fib which per his oncologist did not show an aggressive osseous lesion but was benign.  He was seen by and normally sees [facility] who did the ORIF to about 10 years ago with Dr. Sharl of his femur fracture.  He comes in today with pain in the groin he said they did inject him and he had diagnostic relief but no long-term relief sounds like they did a intra-articular injection though no ultrasound was used.  Denies any weakness loss of bowel or bladder control.  Also describes pain in his lower back that runs down his leg as well.  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  He was given gabapentin  which seems to help him a bit but he is concerned about being addicted to it.  I did tell him the gabapentin  was a good medication for him to take.  I will refer him to physical therapy for his back and his hip.  I did suggest he keep his upcoming appointment with [facility] but may return to us  as needed  Follow-Up Instructions: Return if symptoms worsen or fail to improve.   Orders:  Orders Placed This Encounter  Procedures   XR Lumbar Spine 2-3 Views   XR Pelvis 1-2 Views   No orders of the defined types were placed in this encounter.     Procedures: No procedures performed   Clinical Data: No additional findings.   Subjective: No chief complaint on file.   HPI patient is a pleasant 68 year old gentleman who  presents with a chief complaint of right groin pain and low back pain.  He does have a history of an ORIF by Dr. Sharolyn at [facility] of his right femur.  He also has a history of multiple myeloma which is in remission and adenocarcinoma of the lung he is followed by oncology has an upcoming PET scan.  He does take gabapentin  which helps with some of his symptoms denies loss of bowel or bladder control.  Does not take narcotics as he is a recovered addict  Review of Systems  All other systems reviewed and are negative.    Objective: Vital Signs: There were no vitals taken for this visit.  Physical Exam Constitutional:      Appearance: Normal appearance.  Pulmonary:     Effort: Pulmonary effort is normal.  Skin:    General: Skin is warm and dry.  Neurological:     General: No focal deficit present.     Mental Status: He is alert and oriented to person, place, and time.  Psychiatric:        Mood and Affect: Mood normal.        Behavior: Behavior normal.     Ortho Exam Examination he has no  pain with manipulation of his hip he has good dorsiflexion or flexion of his ankle.  He has fairly good strength with resisted flexion and extension of his leg though some of this is limited by pain.  He does have some tenderness in the lower back.  Sensation is grossly intact Specialty Comments:  No specialty comments available.  Imaging: XR Lumbar Spine 2-3 Views Result Date: 08/19/2024 2 views of his lumbar spine overall slight listhesis and some degenerative changes specially at L4-5 no acute fractures or osseous lesions are appreciated  XR Pelvis 1-2 Views Result Date: 08/19/2024 AP pelvis demonstrates no acute fractures no appreciable osseous lesions.  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   Malnutrition of moderate degree 12/02/2017   AKI (acute kidney injury) (HCC) 11/27/2017   SIRS (systemic inflammatory response syndrome) (HCC) 11/27/2017   Anemia 11/27/2017   Thrombocytopenia (HCC) 11/27/2017   Chronic hepatitis C without hepatic coma (HCC) 11/27/2017   Cancer associated pain    Tumor lysis syndrome    Encounter for antineoplastic chemotherapy    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   SIRS (systemic inflammatory response syndrome) (HCC)    Thrombocytopenia (HCC)    Tobacco abuse     Family History  Problem Relation Age of Onset   COPD Mother    Liver cancer Mother     Past Surgical History:  Procedure Laterality Date   APPENDECTOMY     BRONCHOSCOPY, WITH BIOPSY USING ELECTROMAGNETIC NAVIGATION Bilateral 04/29/2024    Procedure: ROBOTIC ASSISTED NAVIGATIONAL BRONCHOSCOPY;  Surgeon: Isadora Hose, MD;  Location: [facility] ORS;  Service: Pulmonary;  Laterality: Bilateral;   ENDOBRONCHIAL ULTRASOUND Bilateral 04/29/2024   Procedure: ENDOBRONCHIAL ULTRASOUND (EBUS);  Surgeon: Isadora Hose, MD;  Location: [facility] ORS;  Service: Pulmonary;  Laterality: Bilateral;   FEMUR IM NAIL Right 11/20/2017   Procedure: RIGHT FEMORAL INTRAMEDULLARY (IM) NAIL;  Surgeon: Sharl Selinda Dover, MD;  Location: [facility] OR;  Service: Orthopedics;  Laterality: Right;   IR IMAGING GUIDED PORT INSERTION  08/19/2021   PENILE PROSTHESIS IMPLANT     Social History   Occupational History   Not on file  Tobacco Use   Smoking status: Some Days    Current packs/day: 0.25    Average packs/day: 0.3 packs/day for 0.3 years (0.1 ttl pk-yrs)    Types: Cigarettes    Start date: 04/16/2024    Last attempt to quit: 11/09/2017   Smokeless tobacco: Never   Tobacco comments:    Smokes a cigarette every once in a while starting about 2 weeks ago - 04/23/24  Vaping Use   Vaping status: Never Used  Substance and Sexual Activity   Alcohol use: Not Currently   Drug use: Not Currently   Sexual activity: Not on file

## 2024-08-20 LAB — MULTIPLE MYELOMA PANEL, SERUM
Albumin SerPl Elph-Mcnc: 2.7 g/dL — ABNORMAL LOW (ref 2.9–4.4)
Albumin/Glob SerPl: 0.8 (ref 0.7–1.7)
Alpha 1: 0.5 g/dL — ABNORMAL HIGH (ref 0.0–0.4)
Alpha2 Glob SerPl Elph-Mcnc: 1.5 g/dL — ABNORMAL HIGH (ref 0.4–1.0)
B-Globulin SerPl Elph-Mcnc: 1 g/dL (ref 0.7–1.3)
Gamma Glob SerPl Elph-Mcnc: 0.7 g/dL (ref 0.4–1.8)
Globulin, Total: 3.6 g/dL (ref 2.2–3.9)
IgA: 85 mg/dL (ref 61–437)
IgG (Immunoglobin G), Serum: 772 mg/dL (ref 603–1613)
IgM (Immunoglobulin M), Srm: 8 mg/dL — ABNORMAL LOW (ref 20–172)
M Protein SerPl Elph-Mcnc: 0.2 g/dL — ABNORMAL HIGH
Total Protein ELP: 6.3 g/dL (ref 6.0–8.5)

## 2024-08-24 ENCOUNTER — Encounter: Payer: Self-pay | Admitting: Hematology

## 2024-08-24 NOTE — Progress Notes (Signed)
 HEMATOLOGY/ONCOLOGY CLINIC NOTE  Date of Service: .08/15/2024  Chief Complaint: For continued evaluation and management of lung adenocarcinoma and multiple myeloma  HISTORY OF PRESENTING ILLNESS:  Please see previous note for details of initial presentation.  INTERVAL HISTORY:   Andres Fernandez is a 68 y.o. male is here for his cycle 3 of carboplatin  pemetrexed  pembrolizumab  for neoadjuvant treatment of lung adenocarcinoma. He notes some grade 1-2 fatigue.  No new cough or hemoptysis.  No new fevers chills night sweats.  No new focal bone pains. No new treatment toxicities.  Notes significant diarrhea skin rashes or mouth sores. Labs done today were reviewed with him in detail.  REVIEW OF SYSTEMS:    .10 Point review of Systems was done is negative except as noted above.  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   SIRS (systemic inflammatory response syndrome) (HCC)    Thrombocytopenia (HCC)    Tobacco abuse     SURGICAL HISTORY: Past Surgical History:  Procedure Laterality Date   APPENDECTOMY     BRONCHOSCOPY, WITH BIOPSY USING ELECTROMAGNETIC NAVIGATION Bilateral 04/29/2024   Procedure: ROBOTIC ASSISTED NAVIGATIONAL BRONCHOSCOPY;  Surgeon: Isadora Hose, MD;  Location: [facility];  Service: Pulmonary;  Laterality: Bilateral;   ENDOBRONCHIAL ULTRASOUND Bilateral 04/29/2024   Procedure: ENDOBRONCHIAL ULTRASOUND (EBUS);  Surgeon: Isadora Hose, MD;  Location: [facility];  Service: Pulmonary;  Laterality: Bilateral;   FEMUR IM NAIL Right 11/20/2017   Procedure: RIGHT FEMORAL INTRAMEDULLARY (IM) NAIL;  Surgeon: Sharl Selinda Dover, MD;  Location: [facility];  Service:  Orthopedics;  Laterality: Right;   IR IMAGING GUIDED PORT INSERTION  08/19/2021   PENILE PROSTHESIS IMPLANT      SOCIAL HISTORY: Social History   Socioeconomic History   Marital status: Single    Spouse name: Not on file   Number of children: Not on file   Years of education: Not on file   Highest education level: Not on file  Occupational History   Not on file  Tobacco Use   Smoking status: Some Days    Current packs/day: 0.25    Average packs/day: 0.3 packs/day for 0.4 years (0.1 ttl pk-yrs)    Types: Cigarettes    Start date: 04/16/2024    Last attempt to quit: 11/09/2017   Smokeless tobacco: Never   Tobacco comments:    Smokes a cigarette every once in a while starting about 2 weeks ago - 04/23/24  Vaping Use   Vaping status: Never Used  Substance and Sexual Activity   Alcohol use: Not Currently   Drug use: Not Currently   Sexual activity: Not on file  Other Topics Concern   Not on file  Social History Narrative   Not on file   Social Drivers of Health   Financial Resource Strain: Not on file  Food Insecurity: Low Risk  (06/25/2024)   Received from [facility]   Hunger Vital Sign    Within the past 12 months, you worried that your food would run out before you got money to buy more: Never true    Within the past 12 months, the food you bought just didn't last  and you didn't have money to get more. : Never true  Transportation Needs: No Transportation Needs (06/25/2024)   Received from [facility]    In the past 12 months, has lack of reliable transportation kept you from medical appointments, meetings, work or from getting things needed for daily living? : No  Physical Activity: Not on file  Stress: Not on file  Social Connections: Moderately Integrated (04/17/2024)   Social Connection and Isolation Panel    Frequency of Communication with Friends and Family: More than three times a week    Frequency of Social Gatherings with Friends and  Family: Three times a week    Attends Religious Services: More than 4 times per year    Active Member of Clubs or Organizations: Yes    Attends Banker Meetings: More than 4 times per year    Marital Status: Divorced  Intimate Partner Violence: Not At Risk (04/17/2024)   Humiliation, Afraid, Rape, and Kick questionnaire    Fear of Current or Ex-Partner: No    Emotionally Abused: No    Physically Abused: No    Sexually Abused: No    FAMILY HISTORY: Family History  Problem Relation Age of Onset   COPD Mother    Liver cancer Mother     ALLERGIES:  has no known allergies.  MEDICATIONS:  . Current Outpatient Medications on File Prior to Visit  Medication Sig Dispense Refill   albuterol  (VENTOLIN  HFA) 108 (90 Base) MCG/ACT inhaler Inhale 2 puffs into the lungs every 6 (six) hours as needed for shortness of breath.     dexamethasone  (DECADRON ) 4 MG tablet Take 2 tablets (8 mg total) by mouth daily. Take 2 tablets (8 mg total) by mouth daily with food for 3 days after each carbo treatment 24 tablet 2   ferrous sulfate  325 (65 FE) MG EC tablet TAKE 1 TABLET BY MOUTH EVERY DAY WITH BREAKFAST (Patient not taking: Reported on 07/25/2024) 90 tablet 1   Fluticasone Propionate , Inhal, (FLUTICASONE PROPIONATE  DISKUS) 250 MCG/ACT AEPB TAKE 1 PUFF BY MOUTH EVERY DAY (Patient not taking: Reported on 07/25/2024) 60 each 1   folic acid  (FOLVITE ) 1 MG tablet Take 1 tablet (1 mg total) by mouth daily. 30 tablet 4   lidocaine -prilocaine  (EMLA ) cream Apply 1 Application topically as needed. Apply to Port-A-Cath at least 1 hr prior to treatment. 30 g 0   methocarbamol  (ROBAXIN ) 500 MG tablet Take 1 tablet (500 mg total) by mouth 2 (two) times daily. 20 tablet 0   Multiple Vitamins-Minerals (MULTIVITAMIN WITH MINERALS) tablet Take 1 tablet by mouth at bedtime.     naproxen  (NAPROSYN ) 375 MG tablet Take 1 tablet (375 mg total) by mouth 2 (two) times daily. 20 tablet 0   nicotine  (NICODERM CQ  - DOSED  IN MG/24 HOURS) 14 mg/24hr patch PLACE 1 PATCH (14 MG TOTAL) ONTO THE SKIN DAILY. (Patient not taking: Reported on 07/25/2024) 28 patch 1   ondansetron  (ZOFRAN ) 8 MG tablet Take 1 tablet (8 mg total) by mouth every 8 (eight) hours as needed for nausea or vomiting. 20 tablet 0   oxyCODONE -acetaminophen  (PERCOCET/ROXICET) 5-325 MG tablet Take 1 tablet by mouth every 6 (six) hours as needed for severe pain (pain score 7-10). 8 tablet 0   prochlorperazine  (COMPAZINE ) 10 MG tablet Take 1 tablet (10 mg total) by mouth every 6 (six) hours as needed for nausea or vomiting. 30 tablet 0   Spacer/Aero-Holding Chambers (AEROCHAMBER MV) inhaler Use as instructed 1  each 0   Vitamin D , Ergocalciferol , (DRISDOL ) 1.25 MG (50000 UNIT) CAPS capsule Take 1 capsule (50,000 Units total) by mouth once a week. (Patient not taking: Reported on 07/25/2024) 12 capsule 6   Current Facility-Administered Medications on File Prior to Visit  Medication Dose Route Frequency Provider Last Rate Last Admin   acetaminophen  (TYLENOL ) 325 MG tablet            famotidine  (PEPCID ) 20 MG tablet             PHYSICAL EXAMINATION:  VSS  ECOG FS:1 - Symptomatic but completely ambulatory  Wt Readings from Last 3 Encounters:  08/15/24 147 lb 8 oz (66.9 kg)  08/10/24 157 lb (71.2 kg)  07/25/24 156 lb (70.8 kg)  . GENERAL:alert, in no acute distress and comfortable SKIN: no acute rashes, no significant lesions EYES: conjunctiva are pink and non-injected, sclera anicteric OROPHARYNX: MMM, no exudates, no oropharyngeal erythema or ulceration NECK: supple, no JVD LYMPH:  no palpable lymphadenopathy in the cervical, axillary or inguinal regions LUNGS: clear to auscultation b/l with normal respiratory effort HEART: regular rate & rhythm ABDOMEN:  normoactive bowel sounds , non tender, not distended. Extremity: no pedal edema PSYCH: alert & oriented x 3 with fluent speech NEURO: no focal motor/sensory deficits   LABORATORY DATA:  I  have reviewed the data as listed     Latest Ref Rng & Units 08/15/2024   11:34 AM 07/25/2024    8:28 AM 07/23/2024   10:54 AM  CBC  WBC 4.0 - 10.5 K/uL 13.5  9.7  9.8   Hemoglobin 13.0 - 17.0 g/dL 89.5  86.9  87.5   Hematocrit 39.0 - 52.0 % 30.2  37.4  38.5   Platelets 150 - 400 K/uL 326  334  271    CBC    Component Value Date/Time   WBC 13.5 (H) 08/15/2024 1134   WBC 9.8 07/23/2024 1054   RBC 3.42 (L) 08/15/2024 1134   HGB 10.4 (L) 08/15/2024 1134   HGB 8.3 (L) 12/14/2017 1230   HCT 30.2 (L) 08/15/2024 1134   HCT 23.9 (L) 12/14/2017 1230   PLT 326 08/15/2024 1134   PLT 524 (H) 12/14/2017 1230   MCV 88.3 08/15/2024 1134   MCV 88.4 12/14/2017 1230   MCH 30.4 08/15/2024 1134   MCHC 34.4 08/15/2024 1134   RDW 15.1 08/15/2024 1134   RDW 15.3 (H) 12/14/2017 1230   LYMPHSABS 2.1 08/15/2024 1134   LYMPHSABS 1.4 12/14/2017 1230   MONOABS 2.1 (H) 08/15/2024 1134   MONOABS 0.6 12/14/2017 1230   EOSABS 0.0 08/15/2024 1134   EOSABS 0.1 12/14/2017 1230   BASOSABS 0.1 08/15/2024 1134   BASOSABS 0.1 12/14/2017 1230        Latest Ref Rng & Units 08/15/2024   11:34 AM 07/25/2024    8:28 AM 07/23/2024   10:54 AM  CMP  Glucose 70 - 99 mg/dL 895  875  99   BUN 8 - 23 mg/dL 26  28  28    Creatinine 0.61 - 1.24 mg/dL 8.71  8.42  8.63   Sodium 135 - 145 mmol/L 138  140  138   Potassium 3.5 - 5.1 mmol/L 4.0  4.0  4.1   Chloride 98 - 111 mmol/L 108  108  106   CO2 22 - 32 mmol/L 22  24  22    Calcium 8.9 - 10.3 mg/dL 8.8  8.8  9.3   Total Protein 6.5 - 8.1 g/dL 7.1  6.8  Total Bilirubin 0.0 - 1.2 mg/dL 0.2  0.2    Alkaline Phos 38 - 126 U/L 117  83    AST 15 - 41 U/L 10  10    ALT 0 - 44 U/L 17  10      Guardant 360:          04/29/2024 Cytopathology report:  Fine needle Aspiration, RLL mass - Positive for malignancy, adenocarcinoma Bronchial Brushing, RLL - Positive for malignancy, adenocarcinoma Fine needle aspiration, lymph node station 11L negative for malignancy Fine  needle aspiration lymph node 7, station 7, negative for malignancy  03/09/20 ([id number]) Bone Marrow Biopsy at [facility]   07/02/2019 Bone Marrow Biopsy ([id number]) at [facility]    08/07/18 BM Bx:         RADIOGRAPHIC STUDIES: I have personally reviewed the radiological images as listed and agreed with the findings in the report. XR Lumbar Spine 2-3 Views Result Date: 08/19/2024 2 views of his lumbar spine overall slight listhesis and some degenerative changes specially at L4-5 no acute fractures or osseous lesions are appreciated  XR Pelvis 1-2 Views Result Date: 08/19/2024 AP pelvis demonstrates no acute fractures no appreciable osseous lesions.  Hardware intact and in place from previous surgery  MR TIBIA FIBULA RIGHT W WO CONTRAST Result Date: 07/29/2024 CLINICAL DATA:  Proximal fibular diaphyseal lesion EXAM: MRI OF LOWER RIGHT EXTREMITY WITHOUT AND WITH CONTRAST TECHNIQUE: Multiplanar, multisequence MR imaging of the right tibia/fibula was performed both before and after administration of intravenous contrast. CONTRAST:  7mL GADAVIST  GADOBUTROL  1 MMOL/ML IV SOLN COMPARISON:  Radiographs 03/28/2024 FINDINGS: Bones/Joint/Cartilage Distal femoral IM nail noted. Small degenerative focus of subcortical marrow edema anteriorly along the lateral tibial plateau. In the region of concern the proximal fibula, there is some mild cortical thinning for example posteriorly on image 38 series 10 but with normal adjacent marrow and no underlying lesion. No local abnormal enhancement in this vicinity. Further distally in the distal fibular diaphysis, there is accentuated T1 and T2 signal in the posterior fibular cortex for example on image 58 series 10, tracking longitudinally. The high precontrast T1 signal generally favors a benign etiology such as mild stress response/stress injury; no overt stress fracture identified. On the bottom most images there appears to be an edematous type 2  accessory navicular, which can be associated with local pain. Ligaments N/A Muscles and Tendons Unremarkable Soft tissues Unremarkable IMPRESSION: 1. No underlying lesion in the region of concern in the proximal fibula. 2. Accentuated T1 and T2 signal in the posterior fibular cortex/and ostium of the distal fibular diaphysis, tracking longitudinally. The high precontrast T1 signal generally favors a benign etiology such as mild stress response/stress injury; no overt stress fracture identified. 3. On the bottom most images there appears to be an edematous type 2 accessory navicular, which can be associated with local pain. 4. Distal femoral IM nail. Electronically Signed   By: Ryan Salvage M.D.   On: 07/29/2024 08:20    ASSESSMENT & PLAN:    68 y.o. 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   Initial BM Bx showed >95% involvement with kappa restricted serum free light chains. 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 MoL Cy-  08/07/18 BM Bx revealed normocellular bone marrow with trilineage hematopoiesis and 1% plasma cells which indicates the pt is in remission 08/06/18 PET/CT revealed  Expansile lytic lesion involving the left iliac bone, unchanged. Lytic lesion involving the sternum, unchanged. While both are suspicious for myeloma, neither lesion is FDG avid. No hypermetabolic osseous lesions in the visualized axial and appendicular skeleton. Please note that the bilateral lower extremities were not imaged. No suspicious lymphadenopathy. Spleen is normal in size.    Pt began Ninlaro  3mg  maintenance after pt finished 13 cycles of CyBorD and BM bx showed only 1% plasma cells, then resumed CyBorD  and held Ninlaro  in anticipation of his tandem bone marrow transplants beginning in late December 2019 with Dr. Rogue Stallion at [facility]   11/19/18 High dose chemotherapy with Melphalan 140mg /m2 and autologous stem cell rescue  03/20/19 High dose chemotherapy with Melphalan 200mg /m2 and autologous stem cell rescue. Non-infectious diarrhea, uncomplicated course otherwise.  07/02/2019 PET scan with results revealing 1. No FDG avid bone lesions are identified. 2.  Asymmetric uptake within the right, greater than left, palatine tonsils with mild fullness of the right tonsillar soft tissue. ENT consult vs attention on follow up suggested. 3.  Innumerable mixed sclerotic and lytic lesions throughout the axial and appendicular skeleton without associated hypermetabolic activity. 4.  Ancillary CT findings as above.  07/02/2019 bone marrow biopsy ([id number])with results showing: Trilineage hematopoieses with maturation. No plasma cell myeloma identified. Mild anemia and No Rouleaux or circulating plasma cells identified in the peripheral blood.  2) s/p Prophylactic IM nailing for rt femur  completed Physical Therapy at the [facility].  -patient previously reported improvement and is currently progressively back to full time work.  3) Thrombocytopenia resolved   4) Renal insufficiency --likely related to multiple myeloma.  Remained fairly stable with baseline creatinine of 1.5-2.1.  Creatinine is 1.44 today Plan -maintain follow up with nephrology referral to Dr [facility] for continued treatment -Pt completed 8 weeks of maverick for hep c with Dr Efrain on 07/08/18.  -Kidney numbers improved. Creatinine in the 1.7-2.2 range   5) h/o tobacco abuse-counseled on smoking cessation -Currently not smoking.   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  8) Newly diagnosed lung adenocarcinoma stage  IIIA Staging Breakdown:  T category:  Mass >3 cm but <=5 cm = T2a.  BUT presence of a separate nodule in the same lobe upstages to T3.  N category:  Hilar (ipsilateral peribronchial or intrapulmonary) node = N1.  M category:  No clear distant metastasis described = M0.  Overall Stage (NSCLC, AJCC 8th ed.):  T3 N1 M0 ? Stage IIIA. PLAN:  - Labs done today were discussed in detail with the patient. CBC shows mild anemia with hemoglobin of 10.4 WBC count of 13.5 due to G-CSF and normal platelets of 326k CMP is stable with stable chronic kidney disease creatinine of 1.28 TSH within normal limits at 1.47 Myeloma labs show small stable M spike of 0.2 g/dL of IgG kappa likely reactive. Serum kappa and lambda free light chains are within normal limits with normal kappa lambda ratio. No indication for progression of the patient's multiple myeloma/plasma cell leukemia at this time. Patient notes no further toxicities from his previous cycle of chemotherapy and is appropriate to proceed with cycle 3 carboplatin /pemetrexed /pembrolizumab  neoadjuvant treatment. - Will get PET scan after this cycle of treatment and touch base with  Dr. Kerrin regarding possibility of/candidacy for surgery based on response and PFTs. - If surgery is not a possibility will refer the patient to radiation oncology. - Will plan to restart maintenance treatment for his plasma cell leukemia/multiple myeloma after completion of surgery/radiation for lung cancer. - All the patient's questions were answered in details. - He was recommended to continue drinking at least 64 ounces of water daily. He is still having some back pain radiating to his right leg I started him on Neurontin  to help with this. He is following up with orthopedics at [facility]  FOLLOW-UP: PET/CT scan in 1 to 2 weeks Please schedule cycle 4 of carboplatin  time with G-CSF support. Will discuss with Dr. Kerrin regarding surgical  considerations for his lung cancer based on PET scan. MD visit with cycle 4 of treatment.  .The total time spent in the appointment was 30 minutes* .  All of the patient's questions were answered with apparent satisfaction. The patient knows to call the clinic with any problems, questions or concerns.   Emaline Saran MD MS AAHIVMS [facility] Hematology/Oncology Physician [facility]  .*Total Encounter Time as defined by the Centers for Medicare and Medicaid Services includes, in addition to the face-to-face time of a patient visit (documented in the note above) non-face-to-face time: obtaining and reviewing outside history, ordering and reviewing medications, tests or procedures, care coordination (communications with other health care professionals or caregivers) and documentation in the medical record.

## 2024-09-03 ENCOUNTER — Other Ambulatory Visit: Payer: Self-pay

## 2024-09-03 DIAGNOSIS — C3490 Malignant neoplasm of unspecified part of unspecified bronchus or lung: Secondary | ICD-10-CM

## 2024-09-03 DIAGNOSIS — C9000 Multiple myeloma not having achieved remission: Secondary | ICD-10-CM

## 2024-09-04 ENCOUNTER — Encounter (HOSPITAL_COMMUNITY)
Admission: RE | Admit: 2024-09-04 | Discharge: 2024-09-04 | Disposition: A | Discharge status: Home / Self Care | Source: Ambulatory Visit | Attending: Hematology | Admitting: Hematology

## 2024-09-04 DIAGNOSIS — C3491 Malignant neoplasm of unspecified part of right bronchus or lung: Secondary | ICD-10-CM | POA: Diagnosis present

## 2024-09-04 LAB — GLUCOSE, CAPILLARY: Glucose-Capillary: 109 mg/dL — ABNORMAL HIGH (ref 70–99)

## 2024-09-04 MED ORDER — FLUDEOXYGLUCOSE F - 18 (FDG) INJECTION
7.0000 | Freq: Once | INTRAVENOUS | Status: AC | PRN
Start: 2024-09-04 — End: 2024-09-04
  Administered 2024-09-04: 7.2 via INTRAVENOUS

## 2024-09-05 ENCOUNTER — Inpatient Hospital Stay

## 2024-09-05 ENCOUNTER — Other Ambulatory Visit: Payer: Self-pay

## 2024-09-05 ENCOUNTER — Encounter: Payer: Self-pay | Admitting: Hematology

## 2024-09-05 ENCOUNTER — Inpatient Hospital Stay: Admitting: Hematology

## 2024-09-05 ENCOUNTER — Ambulatory Visit

## 2024-09-05 VITALS — BP 125/87 | HR 105 | Temp 98.8°F | Resp 19

## 2024-09-05 VITALS — BP 117/86 | HR 112 | Temp 99.0°F | Resp 20 | Wt 142.6 lb

## 2024-09-05 DIAGNOSIS — C9000 Multiple myeloma not having achieved remission: Secondary | ICD-10-CM

## 2024-09-05 DIAGNOSIS — M25551 Pain in right hip: Secondary | ICD-10-CM | POA: Diagnosis not present

## 2024-09-05 DIAGNOSIS — Z5111 Encounter for antineoplastic chemotherapy: Secondary | ICD-10-CM | POA: Diagnosis not present

## 2024-09-05 DIAGNOSIS — C3490 Malignant neoplasm of unspecified part of unspecified bronchus or lung: Secondary | ICD-10-CM | POA: Diagnosis not present

## 2024-09-05 DIAGNOSIS — Z7189 Other specified counseling: Secondary | ICD-10-CM

## 2024-09-05 DIAGNOSIS — C3491 Malignant neoplasm of unspecified part of right bronchus or lung: Secondary | ICD-10-CM

## 2024-09-05 LAB — CBC WITH DIFFERENTIAL (CANCER CENTER ONLY)
Abs Immature Granulocytes: 0.26 K/uL — ABNORMAL HIGH (ref 0.00–0.07)
Basophils Absolute: 0.1 K/uL (ref 0.0–0.1)
Basophils Relative: 0 %
Eosinophils Absolute: 0 K/uL (ref 0.0–0.5)
Eosinophils Relative: 0 %
HCT: 27.9 % — ABNORMAL LOW (ref 39.0–52.0)
Hemoglobin: 9.5 g/dL — ABNORMAL LOW (ref 13.0–17.0)
Immature Granulocytes: 2 %
Lymphocytes Relative: 9 %
Lymphs Abs: 1.6 K/uL (ref 0.7–4.0)
MCH: 30.4 pg (ref 26.0–34.0)
MCHC: 34.1 g/dL (ref 30.0–36.0)
MCV: 89.1 fL (ref 80.0–100.0)
Monocytes Absolute: 1.6 K/uL — ABNORMAL HIGH (ref 0.1–1.0)
Monocytes Relative: 9 %
Neutro Abs: 13.5 K/uL — ABNORMAL HIGH (ref 1.7–7.7)
Neutrophils Relative %: 80 %
Platelet Count: 179 K/uL (ref 150–400)
RBC: 3.13 MIL/uL — ABNORMAL LOW (ref 4.22–5.81)
RDW: 16.1 % — ABNORMAL HIGH (ref 11.5–15.5)
WBC Count: 17 K/uL — ABNORMAL HIGH (ref 4.0–10.5)
nRBC: 0 % (ref 0.0–0.2)

## 2024-09-05 LAB — URINALYSIS, COMPLETE (UACMP) WITH MICROSCOPIC
Bacteria, UA: NONE SEEN
Bilirubin Urine: NEGATIVE
Glucose, UA: NEGATIVE mg/dL
Hgb urine dipstick: NEGATIVE
Ketones, ur: NEGATIVE mg/dL
Leukocytes,Ua: NEGATIVE
Nitrite: NEGATIVE
Protein, ur: NEGATIVE mg/dL
Specific Gravity, Urine: 1.02 (ref 1.005–1.030)
pH: 5 (ref 5.0–8.0)

## 2024-09-05 LAB — CMP (CANCER CENTER ONLY)
ALT: 12 U/L (ref 0–44)
AST: 11 U/L — ABNORMAL LOW (ref 15–41)
Albumin: 3.6 g/dL (ref 3.5–5.0)
Alkaline Phosphatase: 124 U/L (ref 38–126)
Anion gap: 10 (ref 5–15)
BUN: 23 mg/dL (ref 8–23)
CO2: 22 mmol/L (ref 22–32)
Calcium: 9 mg/dL (ref 8.9–10.3)
Chloride: 106 mmol/L (ref 98–111)
Creatinine: 1.42 mg/dL — ABNORMAL HIGH (ref 0.61–1.24)
GFR, Estimated: 54 mL/min — ABNORMAL LOW (ref >60)
Glucose, Bld: 115 mg/dL — ABNORMAL HIGH (ref 70–99)
Potassium: 4.3 mmol/L (ref 3.5–5.1)
Sodium: 138 mmol/L (ref 135–145)
Total Bilirubin: 0.3 mg/dL (ref 0.0–1.2)
Total Protein: 7.4 g/dL (ref 6.5–8.1)

## 2024-09-05 MED ORDER — SODIUM CHLORIDE 0.9 % IV SOLN: INTRAVENOUS | Status: DC

## 2024-09-05 MED ORDER — DEXAMETHASONE SODIUM PHOSPHATE 10 MG/ML IJ SOLN
10.0000 mg | Freq: Once | INTRAMUSCULAR | Status: AC
  Administered 2024-09-05: 10 mg via INTRAVENOUS
  Filled 2024-09-05: qty 1

## 2024-09-05 MED ORDER — SODIUM CHLORIDE 0.9 % IV SOLN
374.5000 mg | Freq: Once | INTRAVENOUS | Status: AC
  Administered 2024-09-05: 370 mg via INTRAVENOUS
  Filled 2024-09-05: qty 37

## 2024-09-05 MED ORDER — SODIUM CHLORIDE 0.9 % IV SOLN
150.0000 mg | Freq: Once | INTRAVENOUS | Status: AC
  Administered 2024-09-05: 150 mg via INTRAVENOUS
  Filled 2024-09-05: qty 150

## 2024-09-05 MED ORDER — PALONOSETRON HCL INJECTION 0.25 MG/5ML
0.2500 mg | Freq: Once | INTRAVENOUS | Status: AC
  Administered 2024-09-05: 0.25 mg via INTRAVENOUS
  Filled 2024-09-05: qty 5

## 2024-09-05 MED ORDER — SODIUM CHLORIDE 0.9 % IV SOLN
500.0000 mg/m2 | Freq: Once | INTRAVENOUS | Status: AC
  Administered 2024-09-05: 900 mg via INTRAVENOUS
  Filled 2024-09-05: qty 20

## 2024-09-05 MED ORDER — MORPHINE SULFATE (PF) 4 MG/ML IV SOLN
4.0000 mg | Freq: Once | INTRAVENOUS | Status: AC
  Administered 2024-09-05: 4 mg via INTRAVENOUS
  Filled 2024-09-05: qty 1

## 2024-09-05 MED ORDER — MORPHINE SULFATE (PF) 4 MG/ML IV SOLN: 4.0000 mg | Freq: Once | INTRAVENOUS | Status: DC

## 2024-09-05 NOTE — Progress Notes (Signed)
 HEMATOLOGY ONCOLOGY PROGRESS NOTE  Date of service: 09/05/2024  Patient Care Team: Dia Vina HERO, NP-C (Inactive) as PCP - General (Nurse Practitioner) [name], MD as Consulting Physician (Hematology)  CHIEF COMPLAINT/PURPOSE OF CONSULTATION: For continued evaluation and management of lung adenocarcinoma and multiple myeloma.  HISTORY OF PRESENTING ILLNESS:  (12/24/2017) Pt is being seen as outpatient today for hospital fu of his plasma cell leukemia. His labs today 12/14/2017 show hgb 8.3, platelets 524k. He is doing well overall. He has staples in his right upper leg following a recent surgery and has not been in touch with the surgeons for a f/u of this. He is not receiving physical therapy and is not getting around at home.  He notes that he was not scheduled for a post hospitalization nephrology f/u and has no PCP.     On review of systems, pt reports intermittent fatigue, SOB, pain to the left leg, dizziness, dry mouth and denies fever, chills, night sweats, dysuria and any other accompanying symptoms.    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   Plasma cell leukemia (HCC)   11/30/2017 - 10/30/2018 Chemotherapy   The patient had dexamethasone  (DECADRON ) 4 MG tablet, 1 of 1 cycle, Start date: --, End date: -- bortezomib  SQ (VELCADE ) chemo injection 2.5 mg, 1.3 mg/m2 = 2.5 mg, Subcutaneous,  Once, 14 of 15 cycles Administration: 2.25 mg (12/01/2017), 2.25 mg (12/04/2017), 2.25 mg (12/20/2017), 2.25 mg (12/24/2017), 2.25 mg (12/28/2017), 2.25 mg (12/31/2017), 2.25 mg (01/11/2018), 2.25 mg (01/14/2018), 2.25 mg (01/18/2018), 2.25 mg (01/21/2018), 2.25 mg (02/01/2018), 2.25 mg (02/04/2018), 2.25 mg (02/08/2018), 2.25 mg (02/11/2018), 2.25 mg (02/22/2018), 2.25 mg (02/25/2018), 2.25 mg (03/01/2018), 2.25 mg (03/04/2018), 2.25 mg (03/15/2018), 2.25 mg (03/18/2018), 2.25 mg (03/22/2018), 2.25 mg (03/25/2018), 2.25 mg (04/05/2018), 2.25 mg  (04/08/2018), 2.25 mg (04/12/2018), 2.25 mg (04/15/2018), 2.25 mg (04/26/2018), 2.25 mg (04/29/2018), 2.25 mg (05/03/2018), 2.25 mg (05/07/2018), 2.25 mg (05/17/2018), 2.25 mg (05/20/2018), 2.25 mg (05/24/2018), 2.25 mg (05/27/2018), 2.25 mg (06/07/2018), 2.25 mg (06/10/2018), 2.25 mg (06/14/2018), 2.25 mg (06/17/2018), 2.25 mg (06/28/2018), 2.25 mg (07/02/2018), 2.25 mg (07/05/2018), 2.25 mg (07/08/2018), 2.25 mg (07/19/2018), 2.25 mg (07/22/2018), 2.25 mg (07/26/2018), 2.25 mg (07/29/2018), 2.25 mg (08/09/2018), 2.25 mg (08/13/2018), 2.25 mg (08/16/2018), 2.25 mg (08/19/2018), 2.5 mg (10/30/2018)  for chemotherapy treatment.    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)   08/13/2019 - 08/18/2022 Chemotherapy   Patient is on Treatment Plan : MYELOMA RELAPSED/REFRACTORY Carfilzomib  + Dexamethasone  (Kd) weekly q28d     09/15/2022 - 04/11/2024 Chemotherapy   Patient is on Treatment Plan : MYELOMA RELAPSED/REFRACTORY Carfilzomib  (20/70) D1,8,15 + Dexamethasone  weekly (40) (Kd) q28d  x 9 cycles / Dexamethasone  D1,8,15     Primary lung adenocarcinoma (HCC)  06/26/2024 Initial Diagnosis   Primary lung adenocarcinoma (HCC)   07/04/2024 -  Chemotherapy   Patient is on Treatment Plan : LUNG Carboplatin  (5) + Pemetrexed  (500) + Pembrolizumab  (200) D1 q21d Induction x 4  cycles / Maintenance Pemetrexed  (500) + Pembrolizumab  (200) D1 q21d       INTERVAL HISTORY:  Andres Fernandez is a 68 y.o. male being seen  here today for continued evaluation and management of lung adenocarcinoma and multiple myeloma.  he was last seen by me on 08/15/2024; at the time he mentioned experiencing some grade 1-2 fatigue, significant diarrhea, skin rashes, and mouth sores. Otherwise no new cough, hemoptysis, fevers/chills, night sweats, focal bone pains, or treatment toxicities.    Today, he reports tolerating his last treatment, is now onto the 4th Cycle. Denies new chest pain, SOB, or cough. Does have a smoking history.  He says that he has been seen by Dr. Sharl at [facility] for his leg pain, who offered the options of possible injections or a hip replacement as treatment. Endorses pain in anterior thigh/leg and back, as well as a tingling sensation in his ankle and leg - pain endorsed when lifting leg. Describes that it feels like his muscle hardens and then starts to ache. He states that while he does lift objects at work, he is careful not to [employer] himself. For relief, he has tried a heating pad without improvement. Notes that even without palpation, area is still aching. Endorses pain along right side that onset within the past week, which he says is the same as in his leg.  Expresses that he is not eating much, and has been experiencing discomfort while urinating/ passing bowel movements. Endorses occasional diarrhea, which he has tried to relieve with water, Ensure, and Miralax , but no constipation as he has daily BMs. Denies pain with deep breaths, redness, or swelling.  REVIEW OF SYSTEMS:    10 Point review of systems of done and is negative except as noted above.  MEDICAL HISTORY Past Medical History:  Diagnosis Date   AKI (acute kidney injury)    ARF (acute renal failure)    BPH (benign prostatic hyperplasia)    Cancer (HCC)    COPD (chronic obstructive pulmonary disease) (HCC)    Hepatitis C 11/27/2017   Hypertension    Multiple myeloma (HCC)    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   SIRS  (systemic inflammatory response syndrome) (HCC)    Thrombocytopenia    Tobacco abuse     SURGICAL HISTORY Past Surgical History:  Procedure Laterality Date   APPENDECTOMY     BRONCHOSCOPY, WITH BIOPSY USING ELECTROMAGNETIC NAVIGATION Bilateral 04/29/2024   Procedure: ROBOTIC ASSISTED NAVIGATIONAL BRONCHOSCOPY;  Surgeon: Isadora Hose, MD;  Location: [facility] ORS;  Service: Pulmonary;  Laterality: Bilateral;   ENDOBRONCHIAL ULTRASOUND Bilateral 04/29/2024   Procedure: ENDOBRONCHIAL ULTRASOUND (EBUS);  Surgeon: Isadora Hose, MD;  Location: [facility] ORS;  Service: Pulmonary;  Laterality: Bilateral;   FEMUR IM NAIL Right 11/20/2017   Procedure: RIGHT FEMORAL INTRAMEDULLARY (IM) NAIL;  Surgeon: Sharl Selinda Dover, MD;  Location: [facility] OR;  Service: Orthopedics;  Laterality: Right;   IR IMAGING GUIDED PORT INSERTION  08/19/2021   PENILE PROSTHESIS IMPLANT      SOCIAL HISTORY Social History   Tobacco Use   Smoking status: Some Days    Current packs/day: 0.25    Average packs/day: 0.2 packs/day for 0.4 years (0.1 ttl pk-yrs)    Types: Cigarettes    Start date: 04/16/2024    Last attempt to quit: 11/09/2017   Smokeless tobacco: Never   Tobacco comments:    Smokes a cigarette every once in a while starting about 2 weeks ago - 04/23/24  Vaping Use   Vaping  status: Never Used  Substance Use Topics   Alcohol use: Not Currently   Drug use: Not Currently    Social History   Social History Narrative   Not on file    [employer] SDOH Screenings   Food Insecurity: Low Risk  (06/25/2024)   Received from [facility]  Housing: Low Risk  (06/25/2024)   Received from [facility]  Transportation Needs: No Transportation Needs (06/25/2024)   Received from [facility]  Utilities: Medium Risk (06/25/2024)   Received from [facility]  Depression (PHQ2-9): Low Risk  (09/05/2024)  Social Connections: Moderately Integrated (04/17/2024)  Tobacco Use: High Risk (08/19/2024)      FAMILY HISTORY Family History  Problem Relation Age of Onset   COPD Mother    Liver cancer Mother      ALLERGIES: has no known allergies.  MEDICATIONS  Current Outpatient Medications  Medication Sig Dispense Refill   acyclovir  (ZOVIRAX ) 400 MG tablet Take 1 tablet (400 mg total) by mouth daily. 90 tablet 6   albuterol  (VENTOLIN  HFA) 108 (90 Base) MCG/ACT inhaler Inhale 2 puffs into the lungs every 6 (six) hours as needed for shortness of breath.     apixaban  (ELIQUIS ) 5 MG TABS tablet Take 1 tablet (5 mg total) by mouth 2 (two) times daily. 60 tablet 5   dexamethasone  (DECADRON ) 4 MG tablet Take 2 tablets (8 mg total) by mouth daily. Take 2 tablets (8 mg total) by mouth daily with food for 3 days after each [place] treatment 24 tablet 2   folic acid  (FOLVITE ) 1 MG tablet Take 1 tablet (1 mg total) by mouth daily. 30 tablet 4   gabapentin  (NEURONTIN ) 100 MG capsule Take 2 capsules (200 mg total) by mouth 3 (three) times daily. 60 capsule 1   lenalidomide  (REVLIMID ) 10 MG capsule Take 1 capsule (10 mg total) by mouth daily. Take 1 capsule (10 mg total) by mouth daily for 21 days. Take 7 days off. Repeat. 21 capsule 0   lidocaine -prilocaine  (EMLA ) cream Apply 1 Application topically as needed. Apply to Port-A-Cath at least 1 hr prior to treatment. 30 g 0   methocarbamol  (ROBAXIN ) 500 MG tablet Take 1 tablet (500 mg total) by mouth 2 (two) times daily. 20 tablet 0   Multiple Vitamins-Minerals (MULTIVITAMIN WITH MINERALS) tablet Take 1 tablet by mouth at bedtime.     naproxen  (NAPROSYN ) 375 MG tablet Take 1 tablet (375 mg total) by mouth 2 (two) times daily. 20 tablet 0   ondansetron  (ZOFRAN ) 8 MG tablet Take 1 tablet (8 mg total) by mouth every 8 (eight) hours as needed for nausea or vomiting. 20 tablet 0   oxyCODONE -acetaminophen  (PERCOCET/ROXICET) 5-325 MG tablet Take 1 tablet by mouth every 6 (six) hours as needed for severe pain (pain score 7-10). 8 tablet 0   prochlorperazine   (COMPAZINE ) 10 MG tablet Take 1 tablet (10 mg total) by mouth every 6 (six) hours as needed for nausea or vomiting. 30 tablet 0   ferrous sulfate  325 (65 FE) MG EC tablet TAKE 1 TABLET BY MOUTH EVERY DAY WITH BREAKFAST (Patient not taking: Reported on 09/05/2024) 90 tablet 1   Fluticasone Propionate , Inhal, (FLUTICASONE PROPIONATE  DISKUS) 250 MCG/ACT AEPB TAKE 1 PUFF BY MOUTH EVERY DAY (Patient not taking: Reported on 09/05/2024) 60 each 1   nicotine  (NICODERM CQ  - DOSED IN MG/24 HOURS) 14 mg/24hr patch PLACE 1 PATCH (14 MG TOTAL) ONTO THE SKIN DAILY. (Patient not taking: Reported on 09/05/2024) 28 patch 1  Spacer/Aero-Holding Chambers (AEROCHAMBER MV) inhaler Use as instructed 1 each 0   Vitamin D , Ergocalciferol , (DRISDOL ) 1.25 MG (50000 UNIT) CAPS capsule Take 1 capsule (50,000 Units total) by mouth once a week. (Patient not taking: Reported on 09/05/2024) 12 capsule 6   No current facility-administered medications for this visit.   Facility-Administered Medications Ordered in Other Visits  Medication Dose Route Frequency Provider Last Rate Last Admin   acetaminophen  (TYLENOL ) 325 MG tablet            famotidine  (PEPCID ) 20 MG tablet             PHYSICAL EXAMINATION: ECOG PERFORMANCE STATUS: 1 - Symptomatic but completely ambulatory .BP 117/86   Pulse (!) 112   Temp 99 F (37.2 C)   Resp 20   Wt 142 lb 9.6 oz (64.7 kg)   SpO2 97%   BMI 21.68 kg/m  GENERAL:alert, in acute distress and uncomfortable SKIN: no acute rashes, no significant lesions EYES: conjunctiva are pink and non-injected, sclera anicteric OROPHARYNX: MMM, no exudates, no oropharyngeal erythema or ulceration NECK: supple, no JVD LYMPH:  no palpable lymphadenopathy in the cervical, axillary or inguinal regions LUNGS: clear to auscultation b/l with normal respiratory effort HEART: regular rate & rhythm ABDOMEN:  normoactive bowel sounds , non tender, not distended, (+) Guarding  Extremity: no pedal edema, (+) tenderness  to palpation of thigh  PSYCH: alert & oriented x 3 with fluent speech NEURO: no focal motor/sensory deficits  LABORATORY DATA:   I have reviewed the data as listed     Latest Ref Rng & Units 09/05/2024   11:13 AM 08/15/2024   11:34 AM 07/25/2024    8:28 AM  CBC EXTENDED  WBC 4.0 - 10.5 K/uL 17.0  13.5  9.7   RBC 4.22 - 5.81 MIL/uL 3.13  3.42  4.16   Hemoglobin 13.0 - 17.0 g/dL 9.5  89.5  86.9   HCT 60.9 - 52.0 % 27.9  30.2  37.4   Platelets 150 - 400 K/uL 179  326  334   NEUT# 1.7 - 7.7 K/uL 13.5  8.9  6.7   Lymph# 0.7 - 4.0 K/uL 1.6  2.1  2.0    Component     Latest Ref Rng 09/05/2024  IgG (Immunoglobin G), Serum     603 - 1,613 mg/dL 182   IgA     61 - 562 mg/dL 99   IgM (Immunoglobulin M), Srm     20 - 172 mg/dL 7 (L)   Total Protein ELP     6.0 - 8.5 g/dL 6.7 (C)  Albumin SerPl Elph-Mcnc     2.9 - 4.4 g/dL 2.8 (L) (C)  Alpha 1     0.0 - 0.4 g/dL 0.6 (H) (C)  Alpha2 Glob SerPl Elph-Mcnc     0.4 - 1.0 g/dL 1.6 (H) (C)  B-Globulin SerPl Elph-Mcnc     0.7 - 1.3 g/dL 1.0 (C)  Gamma Glob SerPl Elph-Mcnc     0.4 - 1.8 g/dL 0.7 (C)  M Protein SerPl Elph-Mcnc     Not Observed g/dL Not Observed (C)  Globulin, Total     2.2 - 3.9 g/dL 3.9 (C)  Albumin/Glob SerPl     0.7 - 1.7  0.8 (C)  IFE 1 Comment ! (C)  Please Note (HCV): Comment (C)  Kappa free light chain     3.3 - 19.4 mg/L 20.1 (H)   Lambda free light chains     5.7 - 26.3 mg/L  21.3   Kappa, lambda light chain ratio     0.26 - 1.65  0.94        Latest Ref Rng & Units 09/05/2024   11:13 AM 08/15/2024   11:34 AM 07/25/2024    8:28 AM  CMP  Glucose 70 - 99 mg/dL 884  895  875   BUN 8 - 23 mg/dL 23  26  28    Creatinine 0.61 - 1.24 mg/dL 8.57  8.71  8.42   Sodium 135 - 145 mmol/L 138  138  140   Potassium 3.5 - 5.1 mmol/L 4.3  4.0  4.0   Chloride 98 - 111 mmol/L 106  108  108   CO2 22 - 32 mmol/L 22  22  24    Calcium 8.9 - 10.3 mg/dL 9.0  8.8  8.8   Total Protein 6.5 - 8.1 g/dL 7.4  7.1  6.8   Total Bilirubin  0.0 - 1.2 mg/dL 0.3  0.2  0.2   Alkaline Phos 38 - 126 U/L 124  117  83   AST 15 - 41 U/L 11  10  10    ALT 0 - 44 U/L 12  17  10     Review Flowsheet       Latest Ref Rng & Units 05/15/2024  Spirometry  FVC-%Pred-Pre % 89   FVC-%Pred-Post % 85   FEV1-Pre L 1.87   FEV1-%Pred-Pre % 59   FEV1-Post L 1.93   FEV1-%Pred-Post % 61   DLCO unc ml/min/mmHg 8.52    Guardant 360:               04/29/2024 Cytopathology report:   Fine needle Aspiration, RLL mass - Positive for malignancy, adenocarcinoma Bronchial Brushing, RLL - Positive for malignancy, adenocarcinoma Fine needle aspiration, lymph node station 11L negative for malignancy Fine needle aspiration lymph node 7, station 7, negative for malignancy   03/09/20 ([id number]) Bone Marrow Biopsy at [facility]    07/02/2019 Bone Marrow Biopsy ([id number]) at [facility]      08/07/18 BM Bx:            RADIOGRAPHIC STUDIES: I have personally reviewed the radiological images as listed and agreed with the findings in the report. NM PET Image Restag (PS) Skull Base To Thigh Result Date: 09/04/2024 CLINICAL DATA:  Subsequent treatment strategy for lung adenocarcinoma. EXAM: NUCLEAR MEDICINE PET SKULL BASE TO THIGH TECHNIQUE: 7.2 mCi F-18 FDG was injected intravenously. Full-ring PET imaging was performed from the skull base to thigh after the radiotracer. CT data was obtained and used for attenuation correction and anatomic localization. Fasting blood glucose: 109 mg/dl COMPARISON:  FDG PET scan 04/11/2024 FINDINGS: NECK: No hypermetabolic lymph nodes in the neck. Incidental CT findings: None. CHEST: Interval improvement of hypermetabolic nodules the RIGHT lung base. Dominant nodule measures 2.5 cm compared to 2.8 cm with SUV max equal 8.4 compared SUV max equal 11.8. Smaller adjacent nodule less well-defined measuring 9 mm on image 87 compared to 12 mm with SUV max equal 2.2 compared SUV max 5.7. The Dominant nodule  remains intensely hypermetabolic Centrilobular emphysema the upper lobes. No additional suspicious nodules. Small peripheral nodule in the RIGHT upper lobe measuring 6 mm is unchanged does not have metabolic activity (image 72). No hypermetabolic mediastinal lymph nodes Incidental CT findings: None. ABDOMEN/PELVIS: No abnormal hypermetabolic activity within the liver, pancreas, adrenal glands, or spleen. No hypermetabolic lymph nodes in the abdomen or pelvis. Incidental CT findings: Atherosclerotic calcification of the aorta. Penile  prosthetic noted SKELETON: No focal hypermetabolic activity to suggest skeletal metastasis. Incidental CT findings: From fixation the proximal RIGHT femur IMPRESSION: 1. Interval improvement of hypermetabolic nodules in the RIGHT lower lobe with decrease in size and metabolic activity. The dominant nodule remains intense hypermetabolic. 2. No evidence of metastatic adenopathy in the chest. 3. No evidence of distant metastatic disease. 4. Aortic Atherosclerosis (ICD10-I70.0) and Emphysema (ICD10-J43.9). Electronically Signed   By: Jackquline Boxer M.D.   On: 09/04/2024 08:40   XR Lumbar Spine 2-3 Views Result Date: 08/19/2024 2 views of his lumbar spine overall slight listhesis and some degenerative changes specially at L4-5 no acute fractures or osseous lesions are appreciated  XR Pelvis 1-2 Views Result Date: 08/19/2024 AP pelvis demonstrates no acute fractures no appreciable osseous lesions.  Hardware intact and in place from previous surgery   ASSESSMENT & PLAN:   68 y.o. 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    Initial BM Bx showed >95% involvement with kappa restricted serum free light chains. > 20% plasma cell in  the peripheral blood concerning for plasma cell leukemia. MoL Cy-    08/07/18 BM Bx revealed normocellular bone marrow with trilineage hematopoiesis and 1% plasma cells which indicates the pt is in remission 08/06/18 PET/CT revealed  Expansile lytic lesion involving the left iliac bone, unchanged. Lytic lesion involving the sternum, unchanged. While both are suspicious for myeloma, neither lesion is FDG avid. No hypermetabolic osseous lesions in the visualized axial and appendicular skeleton. Please note that the bilateral lower extremities were not imaged. No suspicious lymphadenopathy. Spleen is normal in size.     Pt began Ninlaro  3mg  maintenance after pt finished 13 cycles of CyBorD and BM bx showed only 1% plasma cells, then resumed CyBorD and held Ninlaro  in anticipation of his tandem bone marrow transplants beginning in late December 2019 with Dr. Rogue Stallion at [facility]    11/19/18 High dose chemotherapy with Melphalan 140mg /m2 and autologous stem cell rescue   03/20/19 High dose chemotherapy with Melphalan 200mg /m2 and autologous stem cell rescue. Non-infectious diarrhea, uncomplicated course otherwise.   07/02/2019 PET scan with results revealing 1. No FDG avid bone lesions are identified. 2.  Asymmetric uptake within the right, greater than left, palatine tonsils with mild fullness of the right tonsillar soft tissue. ENT consult vs attention on follow up suggested. 3.  Innumerable mixed sclerotic and lytic lesions throughout the axial and appendicular skeleton without associated hypermetabolic activity. 4.  Ancillary CT findings as above.   07/02/2019 bone marrow biopsy ([id number])with results showing: Trilineage hematopoieses with maturation. No plasma cell myeloma identified. Mild anemia and No Rouleaux or circulating plasma cells identified in the peripheral blood.   2) s/p Prophylactic IM nailing for rt femur  completed [facility] at the [facility].  -patient previously  reported improvement and is currently progressively back to full time work.   3) Thrombocytopenia resolved    4) Renal insufficiency --likely related to multiple myeloma.  Remained fairly stable with baseline creatinine of 1.5-2.1.   Plan -maintain follow up with nephrology referral to Dr Rayburn [facility] for continued treatment -Pt completed 8 weeks of maverick for hep c with Dr Efrain on 07/08/18.  -Kidney numbers improved. Creatinine in the 1.7-2.2 range    5) h/o  tobacco abuse-counseled on smoking cessation -Currently not smoking.   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 PLAN - Patient's labs from today were discussed with him in details. -CBC shows hemoglobin of 9.5 platelet count of 179k and WBC count of 17k with a neutrophil count of 13.5k No overt fever. - CMP is stable with a creatinine of 1.42 Myeloma panel shows no observable M spike and IgG kappa monoclonal protein on IFE Serum kappa lambda free light chains showed normal kappa lambda ratio. PET scan shows no evidence of active myeloma Patient has no evidence of progression of his plasma cell leukemia/multiple myeloma at this time. We shall continue to hold his Revlimid  in the context of his lung cancer treatment currently.  8) right flank pain and leukocytosis -Leukocytosis could be paraneoplastic or from his G-CSF given with chemotherapy UA and UCx were done UA was within normal limits Urine culture shows no evidence of urinary tract infection  9) right thigh pain -Following with Dr. Sharl from [facility] for evaluation and management. - Received pain medications while here for chemotherapy.  10) recently diagnosed lung adenocarcinoma stage IIIA Staging Breakdown:  T category:  Mass >3 cm but <=5 cm = T2a.  BUT presence of a separate nodule in the same lobe upstages to T3.  N category:  Hilar (ipsilateral  peribronchial or intrapulmonary) node = N1.  M category:  No clear distant metastasis described = M0.  Overall Stage (NSCLC, AJCC 8th ed.):  T3 N1 M0 ? Stage IIIA.  PLAN:  -Patient's status post 3 cycles of neoadjuvant carboplatin  pemetrexed  pembrolizumab  with good response to treatment and no notable toxicities. PET CT scan from 09/04/2024 showed interval improvement of his hypermetabolic nodules in the right lower lobe with decrease in size and metabolic activity.  The dominant nodule remains intensely hypermetabolic.  No evidence of metastatic adenopathy in the chest no evidence of distant metastatic disease. - Patient is not keen to pursue surgery. - Will refer him to radiation oncology for consideration of definitive radiation therapy and then will pick up post radiation maintenance immunotherapy. - Will also send order Guardant360 on peripheral blood since we are not proceeding to surgery at this time.  Might expand adjuvant treatment options.  -Okay to proceed with cycle 4 of carboplatin /pemetrexed .  Holding immunotherapy for this cycle since he might proceed to radiation therapy.  FOLLOW-UP: -Referral to radiation oncology for consideration of radiation therapy after 4 cycles of neoadjuvant chemoimmunotherapy -Holding myeloma treatment at this time -Continue follow-up with orthopedics for evaluation and management of thigh pain. -MD visit in 4 weeks with labs  The total time spent in the appointment was 40 minutes*.  All of the patient's questions were answered with apparent satisfaction. The patient knows to call the clinic with any problems, questions or concerns.   Emaline Saran MD MS AAHIVMS [facility] Hematology/Oncology Physician [facility]  .*Total Encounter Time as defined by the Centers for Medicare and Medicaid Services includes, in addition to the face-to-face time of a patient visit (documented in the note above) non-face-to-face time: obtaining and  reviewing outside history, ordering and reviewing medications, tests or procedures, care coordination (communications with other health care professionals or caregivers) and documentation in the medical record.  I,Emily Lagle,acting as a Neurosurgeon for Emaline Saran, MD.,have documented all relevant documentation on the behalf of Emaline Saran, MD,as directed by  Emaline Saran, MD while in  the presence of Emaline Saran, MD.  I have reviewed the above documentation for accuracy and completeness, and I agree with the above.  Gautam Kale, MD

## 2024-09-05 NOTE — Patient Instructions (Signed)
 [facility] - A DEPT OF [facility]  Discharge Instructions: Thank you for choosing [facility] to provide your oncology and hematology care.   If you have a lab appointment with the Cancer Center, please go directly to the Cancer Center and check in at the registration area.   Wear comfortable clothing and clothing appropriate for easy access to any Portacath or PICC line.   We strive to give you quality time with your provider. You may need to reschedule your appointment if you arrive late (15 or more minutes).  Arriving late affects you and other patients whose appointments are after yours.  Also, if you miss three or more appointments without notifying the office, you may be dismissed from the clinic at the provider's discretion.      For prescription refill requests, have your pharmacy contact our office and allow 72 hours for refills to be completed.    Today you received the following chemotherapy and/or immunotherapy agents Keytruda, Aliminta, Carboplatin      To help prevent nausea and vomiting after your treatment, we encourage you to take your nausea medication as directed.  BELOW ARE SYMPTOMS THAT SHOULD BE REPORTED IMMEDIATELY: *FEVER GREATER THAN 100.4 F (38 C) OR HIGHER *CHILLS OR SWEATING *NAUSEA AND VOMITING THAT IS NOT CONTROLLED WITH YOUR NAUSEA MEDICATION *UNUSUAL SHORTNESS OF BREATH *UNUSUAL BRUISING OR BLEEDING *URINARY PROBLEMS (pain or burning when urinating, or frequent urination) *BOWEL PROBLEMS (unusual diarrhea, constipation, pain near the anus) TENDERNESS IN MOUTH AND THROAT WITH OR WITHOUT PRESENCE OF ULCERS (sore throat, sores in mouth, or a toothache) UNUSUAL RASH, SWELLING OR PAIN  UNUSUAL VAGINAL DISCHARGE OR ITCHING   Items with * indicate a potential emergency and should be followed up as soon as possible or go to the Emergency Department if any problems should occur.  Please show the CHEMOTHERAPY ALERT  CARD or IMMUNOTHERAPY ALERT CARD at check-in to the Emergency Department and triage nurse.  Should you have questions after your visit or need to cancel or reschedule your appointment, please contact [facility] - A DEPT OF [facility]  Dept: [phone]  and follow the prompts.  Office hours are 8:00 a.m. to 4:30 p.m. Monday - Friday. Please note that voicemails left after 4:00 p.m. may not be returned until the following business day.  We are closed weekends and major holidays. You have access to a nurse at all times for urgent questions. Please call the main number to the clinic Dept: [phone] and follow the prompts.   For any non-urgent questions, you may also contact your provider using MyChart. We now offer e-Visits for anyone 32 and older to request care online for non-urgent symptoms. For details visit [email or web address].   Also download the MyChart app! Go to the app store, search MyChart, open the app, select [place], and log in with your MyChart username and password.

## 2024-09-06 LAB — URINE CULTURE: Culture: NO GROWTH

## 2024-09-08 ENCOUNTER — Inpatient Hospital Stay

## 2024-09-08 VITALS — BP 120/72 | HR 92 | Temp 98.3°F | Resp 18

## 2024-09-08 DIAGNOSIS — Z7189 Other specified counseling: Secondary | ICD-10-CM

## 2024-09-08 DIAGNOSIS — Z5111 Encounter for antineoplastic chemotherapy: Secondary | ICD-10-CM | POA: Diagnosis not present

## 2024-09-08 DIAGNOSIS — C3490 Malignant neoplasm of unspecified part of unspecified bronchus or lung: Secondary | ICD-10-CM

## 2024-09-08 LAB — MULTIPLE MYELOMA PANEL, SERUM
Albumin SerPl Elph-Mcnc: 2.8 g/dL — ABNORMAL LOW (ref 2.9–4.4)
Albumin/Glob SerPl: 0.8 (ref 0.7–1.7)
Alpha 1: 0.6 g/dL — ABNORMAL HIGH (ref 0.0–0.4)
Alpha2 Glob SerPl Elph-Mcnc: 1.6 g/dL — ABNORMAL HIGH (ref 0.4–1.0)
B-Globulin SerPl Elph-Mcnc: 1 g/dL (ref 0.7–1.3)
Gamma Glob SerPl Elph-Mcnc: 0.7 g/dL (ref 0.4–1.8)
Globulin, Total: 3.9 g/dL (ref 2.2–3.9)
IgA: 99 mg/dL (ref 61–437)
IgG (Immunoglobin G), Serum: 817 mg/dL (ref 603–1613)
IgM (Immunoglobulin M), Srm: 7 mg/dL — ABNORMAL LOW (ref 20–172)
Total Protein ELP: 6.7 g/dL (ref 6.0–8.5)

## 2024-09-08 LAB — KAPPA/LAMBDA LIGHT CHAINS
Kappa free light chain: 20.1 mg/L — ABNORMAL HIGH (ref 3.3–19.4)
Kappa, lambda light chain ratio: 0.94 (ref 0.26–1.65)
Lambda free light chains: 21.3 mg/L (ref 5.7–26.3)

## 2024-09-08 MED ORDER — PEGFILGRASTIM-JMDB 6 MG/0.6ML ~~LOC~~ SOSY
6.0000 mg | PREFILLED_SYRINGE | Freq: Once | SUBCUTANEOUS | Status: AC
  Administered 2024-09-08: 6 mg via SUBCUTANEOUS
  Filled 2024-09-08: qty 0.6

## 2024-09-15 ENCOUNTER — Encounter: Payer: Self-pay | Admitting: Hematology

## 2024-09-17 ENCOUNTER — Other Ambulatory Visit: Payer: Self-pay

## 2024-09-17 DIAGNOSIS — C3490 Malignant neoplasm of unspecified part of unspecified bronchus or lung: Secondary | ICD-10-CM

## 2024-09-24 ENCOUNTER — Other Ambulatory Visit: Payer: Self-pay

## 2024-09-24 DIAGNOSIS — C3490 Malignant neoplasm of unspecified part of unspecified bronchus or lung: Secondary | ICD-10-CM

## 2024-09-24 DIAGNOSIS — C9000 Multiple myeloma not having achieved remission: Secondary | ICD-10-CM

## 2024-09-26 ENCOUNTER — Inpatient Hospital Stay

## 2024-09-26 ENCOUNTER — Inpatient Hospital Stay (HOSPITAL_BASED_OUTPATIENT_CLINIC_OR_DEPARTMENT_OTHER): Admitting: Hematology

## 2024-09-26 ENCOUNTER — Inpatient Hospital Stay: Admitting: Hematology

## 2024-09-26 ENCOUNTER — Inpatient Hospital Stay: Attending: Hematology

## 2024-09-26 VITALS — BP 138/84 | HR 112 | Temp 97.8°F | Resp 17 | Ht 68.0 in | Wt 139.6 lb

## 2024-09-26 DIAGNOSIS — Z9221 Personal history of antineoplastic chemotherapy: Secondary | ICD-10-CM | POA: Insufficient documentation

## 2024-09-26 DIAGNOSIS — R309 Painful micturition, unspecified: Secondary | ICD-10-CM | POA: Diagnosis not present

## 2024-09-26 DIAGNOSIS — C3490 Malignant neoplasm of unspecified part of unspecified bronchus or lung: Secondary | ICD-10-CM

## 2024-09-26 DIAGNOSIS — C9001 Multiple myeloma in remission: Secondary | ICD-10-CM | POA: Diagnosis present

## 2024-09-26 DIAGNOSIS — C3431 Malignant neoplasm of lower lobe, right bronchus or lung: Secondary | ICD-10-CM | POA: Insufficient documentation

## 2024-09-26 DIAGNOSIS — R10A1 Flank pain, right side: Secondary | ICD-10-CM | POA: Diagnosis not present

## 2024-09-26 DIAGNOSIS — D72829 Elevated white blood cell count, unspecified: Secondary | ICD-10-CM | POA: Diagnosis not present

## 2024-09-26 DIAGNOSIS — C9000 Multiple myeloma not having achieved remission: Secondary | ICD-10-CM

## 2024-09-26 DIAGNOSIS — C9011 Plasma cell leukemia in remission: Secondary | ICD-10-CM | POA: Diagnosis not present

## 2024-09-26 DIAGNOSIS — Z87891 Personal history of nicotine dependence: Secondary | ICD-10-CM | POA: Diagnosis not present

## 2024-09-26 DIAGNOSIS — Z800 Family history of malignant neoplasm of digestive organs: Secondary | ICD-10-CM | POA: Diagnosis not present

## 2024-09-26 DIAGNOSIS — D630 Anemia in neoplastic disease: Secondary | ICD-10-CM | POA: Insufficient documentation

## 2024-09-26 DIAGNOSIS — N189 Chronic kidney disease, unspecified: Secondary | ICD-10-CM | POA: Diagnosis not present

## 2024-09-26 DIAGNOSIS — Z79899 Other long term (current) drug therapy: Secondary | ICD-10-CM | POA: Insufficient documentation

## 2024-09-26 LAB — CBC WITH DIFFERENTIAL (CANCER CENTER ONLY)
Abs Immature Granulocytes: 0.4 K/uL — ABNORMAL HIGH (ref 0.00–0.07)
Basophils Absolute: 0 K/uL (ref 0.0–0.1)
Basophils Relative: 0 %
Eosinophils Absolute: 0 K/uL (ref 0.0–0.5)
Eosinophils Relative: 0 %
HCT: 23.6 % — ABNORMAL LOW (ref 39.0–52.0)
Hemoglobin: 8 g/dL — ABNORMAL LOW (ref 13.0–17.0)
Immature Granulocytes: 2 %
Lymphocytes Relative: 9 %
Lymphs Abs: 1.5 K/uL (ref 0.7–4.0)
MCH: 31.9 pg (ref 26.0–34.0)
MCHC: 33.9 g/dL (ref 30.0–36.0)
MCV: 94 fL (ref 80.0–100.0)
Monocytes Absolute: 1.1 K/uL — ABNORMAL HIGH (ref 0.1–1.0)
Monocytes Relative: 6 %
Neutro Abs: 14.1 K/uL — ABNORMAL HIGH (ref 1.7–7.7)
Neutrophils Relative %: 83 %
Platelet Count: 109 K/uL — ABNORMAL LOW (ref 150–400)
RBC: 2.51 MIL/uL — ABNORMAL LOW (ref 4.22–5.81)
RDW: 21.4 % — ABNORMAL HIGH (ref 11.5–15.5)
WBC Count: 17.1 K/uL — ABNORMAL HIGH (ref 4.0–10.5)
nRBC: 0.3 % — ABNORMAL HIGH (ref 0.0–0.2)

## 2024-09-26 LAB — CMP (CANCER CENTER ONLY)
ALT: 14 U/L (ref 0–44)
AST: 15 U/L (ref 15–41)
Albumin: 3.8 g/dL (ref 3.5–5.0)
Alkaline Phosphatase: 107 U/L (ref 38–126)
Anion gap: 10 (ref 5–15)
BUN: 22 mg/dL (ref 8–23)
CO2: 24 mmol/L (ref 22–32)
Calcium: 9.8 mg/dL (ref 8.9–10.3)
Chloride: 107 mmol/L (ref 98–111)
Creatinine: 1.32 mg/dL — ABNORMAL HIGH (ref 0.61–1.24)
GFR, Estimated: 59 mL/min — ABNORMAL LOW (ref >60)
Glucose, Bld: 143 mg/dL — ABNORMAL HIGH (ref 70–99)
Potassium: 4 mmol/L (ref 3.5–5.1)
Sodium: 141 mmol/L (ref 135–145)
Total Bilirubin: 0.3 mg/dL (ref 0.0–1.2)
Total Protein: 7.4 g/dL (ref 6.5–8.1)

## 2024-09-26 LAB — TSH: TSH: 1.38 u[IU]/mL (ref 0.350–4.500)

## 2024-09-26 NOTE — Progress Notes (Signed)
 HEMATOLOGY ONCOLOGY PROGRESS NOTE  Date of service: 09/26/2024  Patient Care Team: Dia Vina HERO, NP-C (Inactive) as PCP - General (Nurse Practitioner) [name], MD as Consulting Physician (Hematology)  CHIEF COMPLAINT/PURPOSE OF CONSULTATION: For continued evaluation and management of lung adenocarcinoma and multiple myeloma.  HISTORY OF PRESENTING ILLNESS:  (12/24/2017) Pt is being seen as outpatient today for hospital fu of his plasma cell leukemia. His labs today 12/14/2017 show hgb 8.3, platelets 524k. He is doing well overall. He has staples in his right upper leg following a recent surgery and has not been in touch with the surgeons for a f/u of this. He is not receiving physical therapy and is not getting around at home.  He notes that he was not scheduled for a post hospitalization nephrology f/u and has no PCP.     On review of systems, pt reports intermittent fatigue, SOB, pain to the left leg, dizziness, dry mouth and denies fever, chills, night sweats, dysuria and any other accompanying symptoms.    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   Plasma cell leukemia (HCC)   11/30/2017 - 10/30/2018 Chemotherapy   The patient had dexamethasone  (DECADRON ) 4 MG tablet, 1 of 1 cycle, Start date: --, End date: -- bortezomib  SQ (VELCADE ) chemo injection 2.5 mg, 1.3 mg/m2 = 2.5 mg, Subcutaneous,  Once, 14 of 15 cycles Administration: 2.25 mg (12/01/2017), 2.25 mg (12/04/2017), 2.25 mg (12/20/2017), 2.25 mg (12/24/2017), 2.25 mg (12/28/2017), 2.25 mg (12/31/2017), 2.25 mg (01/11/2018), 2.25 mg (01/14/2018), 2.25 mg (01/18/2018), 2.25 mg (01/21/2018), 2.25 mg (02/01/2018), 2.25 mg (02/04/2018), 2.25 mg (02/08/2018), 2.25 mg (02/11/2018), 2.25 mg (02/22/2018), 2.25 mg (02/25/2018), 2.25 mg (03/01/2018), 2.25 mg (03/04/2018), 2.25 mg (03/15/2018), 2.25 mg (03/18/2018), 2.25 mg (03/22/2018), 2.25 mg (03/25/2018), 2.25 mg (04/05/2018), 2.25 mg  (04/08/2018), 2.25 mg (04/12/2018), 2.25 mg (04/15/2018), 2.25 mg (04/26/2018), 2.25 mg (04/29/2018), 2.25 mg (05/03/2018), 2.25 mg (05/07/2018), 2.25 mg (05/17/2018), 2.25 mg (05/20/2018), 2.25 mg (05/24/2018), 2.25 mg (05/27/2018), 2.25 mg (06/07/2018), 2.25 mg (06/10/2018), 2.25 mg (06/14/2018), 2.25 mg (06/17/2018), 2.25 mg (06/28/2018), 2.25 mg (07/02/2018), 2.25 mg (07/05/2018), 2.25 mg (07/08/2018), 2.25 mg (07/19/2018), 2.25 mg (07/22/2018), 2.25 mg (07/26/2018), 2.25 mg (07/29/2018), 2.25 mg (08/09/2018), 2.25 mg (08/13/2018), 2.25 mg (08/16/2018), 2.25 mg (08/19/2018), 2.5 mg (10/30/2018)  for chemotherapy treatment.    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)   08/13/2019 - 08/18/2022 Chemotherapy   Patient is on Treatment Plan : MYELOMA RELAPSED/REFRACTORY Carfilzomib  + Dexamethasone  (Kd) weekly q28d     09/15/2022 - 04/11/2024 Chemotherapy   Patient is on Treatment Plan : MYELOMA RELAPSED/REFRACTORY Carfilzomib  (20/70) D1,8,15 + Dexamethasone  weekly (40) (Kd) q28d  x 9 cycles / Dexamethasone  D1,8,15     Primary lung adenocarcinoma (HCC)  06/26/2024 Initial Diagnosis   Primary lung adenocarcinoma (HCC)   07/04/2024 -  Chemotherapy   Patient is on Treatment Plan : LUNG Carboplatin  (5) + Pemetrexed  (500) + Pembrolizumab  (200) D1 q21d Induction x 4  cycles / Maintenance Pemetrexed  (500) + Pembrolizumab  (200) D1 q21d       INTERVAL HISTORY:  Andres Fernandez is a 68 y.o. male being seen  here today for continued evaluation and management of lung adenocarcinoma and multiple myeloma.  he was last seen by me on 09/05/2024; at the time he mentioned experiencing right leg pain for which he had been seeing Orthopedics, as well as right-sided abdominal pain similar to his leg pain. Reported he was not eating much, and has been experiencing discomfort while urinating/ passing bowel movements, as well as occasional diarrhea.  Today, he reports his pain has improved since last being seen after receiving an intra-articular injection in his right hip - Gabapentin  did not provide any relief. It was suggested that his hip pain is being contributed by his right femur intramedullary nail fixation, which was placed in 2019. He says that he and his Orthopedist discussed the possibility he may need a hip replacement.   He endorses having experienced some SOB and a new cough. He is UTD on his PNA vaccination.  Also says that he is taking a Multi-Vitamin.   REVIEW OF SYSTEMS:    10 Point review of systems of done and is negative except as noted above.  MEDICAL HISTORY Past Medical History:  Diagnosis Date   AKI (acute kidney injury)    ARF (acute renal failure)    BPH (benign prostatic hyperplasia)    Cancer (HCC)    COPD (chronic obstructive pulmonary disease) (HCC)    Hepatitis C 11/27/2017   Hypertension    Multiple myeloma (HCC)    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   SIRS (systemic inflammatory response syndrome) (HCC)    Thrombocytopenia    Tobacco abuse     SURGICAL HISTORY Past Surgical History:  Procedure Laterality Date   APPENDECTOMY     BRONCHOSCOPY, WITH BIOPSY USING ELECTROMAGNETIC NAVIGATION Bilateral 04/29/2024   Procedure: ROBOTIC ASSISTED NAVIGATIONAL BRONCHOSCOPY;  Surgeon: Isadora Hose, MD;  Location: [facility] ORS;  Service: Pulmonary;  Laterality: Bilateral;   ENDOBRONCHIAL ULTRASOUND Bilateral 04/29/2024   Procedure: ENDOBRONCHIAL ULTRASOUND (EBUS);  Surgeon:  Isadora Hose, MD;  Location: [facility] ORS;  Service: Pulmonary;  Laterality: Bilateral;   FEMUR IM NAIL Right 11/20/2017   Procedure: RIGHT FEMORAL INTRAMEDULLARY (IM) NAIL;  Surgeon: Sharl Selinda Dover, MD;  Location: [facility];  Service: Orthopedics;  Laterality: Right;   IR IMAGING GUIDED PORT INSERTION  08/19/2021   PENILE PROSTHESIS IMPLANT      SOCIAL HISTORY Social History   Tobacco Use   Smoking status: Some Days    Current packs/day: 0.25    Average packs/day: 0.3 packs/day for 0.5 years (0.1 ttl pk-yrs)    Types: Cigarettes    Start date: 04/16/2024    Last attempt to quit: 11/09/2017   Smokeless tobacco: Never   Tobacco comments:    Smokes a cigarette every once in a while starting about 2 weeks ago - 04/23/24  Vaping Use   Vaping status: Never Used  Substance Use Topics   Alcohol use: Not Currently   Drug use: Not Currently    Social History   Social History Narrative   Not on file    [employer] SDOH Screenings   Food Insecurity: Low Risk  (06/25/2024)   Received from [facility]  Housing: Low Risk  (06/25/2024)   Received from [facility]  Transportation Needs: No Transportation Needs (06/25/2024)   Received from [facility]  Utilities: Medium Risk (06/25/2024)  Received from [facility]  Depression (PHQ2-9): Low Risk  (09/26/2024)  Social Connections: Moderately Integrated (04/17/2024)  Tobacco Use: High Risk (08/19/2024)     FAMILY HISTORY Family History  Problem Relation Age of Onset   COPD Mother    Liver cancer Mother      ALLERGIES: has no known allergies.  MEDICATIONS  Current Outpatient Medications  Medication Sig Dispense Refill   acyclovir  (ZOVIRAX ) 400 MG tablet Take 1 tablet (400 mg total) by mouth daily. 90 tablet 6   albuterol  (VENTOLIN  HFA) 108 (90 Base) MCG/ACT inhaler Inhale 2 puffs into the lungs every 6 (six) hours as needed for shortness of breath.     apixaban  (ELIQUIS ) 5 MG TABS tablet Take 1 tablet (5 mg  total) by mouth 2 (two) times daily. 60 tablet 5   dexamethasone  (DECADRON ) 4 MG tablet Take 2 tablets (8 mg total) by mouth daily. Take 2 tablets (8 mg total) by mouth daily with food for 3 days after each carbo treatment 24 tablet 2   ferrous sulfate  325 (65 FE) MG EC tablet TAKE 1 TABLET BY MOUTH EVERY DAY WITH BREAKFAST 90 tablet 1   Fluticasone Propionate , Inhal, (FLUTICASONE PROPIONATE  DISKUS) 250 MCG/ACT AEPB TAKE 1 PUFF BY MOUTH EVERY DAY 60 each 1   gabapentin  (NEURONTIN ) 100 MG capsule Take 2 capsules (200 mg total) by mouth 3 (three) times daily. 60 capsule 1   lenalidomide  (REVLIMID ) 10 MG capsule Take 1 capsule (10 mg total) by mouth daily. Take 1 capsule (10 mg total) by mouth daily for 21 days. Take 7 days off. Repeat. 21 capsule 0   lidocaine -prilocaine  (EMLA ) cream Apply 1 Application topically as needed. Apply to Port-A-Cath at least 1 hr prior to treatment. 30 g 0   methocarbamol  (ROBAXIN ) 500 MG tablet Take 1 tablet (500 mg total) by mouth 2 (two) times daily. 20 tablet 0   Multiple Vitamins-Minerals (MULTIVITAMIN WITH MINERALS) tablet Take 1 tablet by mouth at bedtime.     naproxen  (NAPROSYN ) 375 MG tablet Take 1 tablet (375 mg total) by mouth 2 (two) times daily. 20 tablet 0   nicotine  (NICODERM CQ  - DOSED IN MG/24 HOURS) 14 mg/24hr patch PLACE 1 PATCH (14 MG TOTAL) ONTO THE SKIN DAILY. 28 patch 1   ondansetron  (ZOFRAN ) 8 MG tablet Take 1 tablet (8 mg total) by mouth every 8 (eight) hours as needed for nausea or vomiting. 20 tablet 0   oxyCODONE -acetaminophen  (PERCOCET/ROXICET) 5-325 MG tablet Take 1 tablet by mouth every 6 (six) hours as needed for severe pain (pain score 7-10). 8 tablet 0   prochlorperazine  (COMPAZINE ) 10 MG tablet Take 1 tablet (10 mg total) by mouth every 6 (six) hours as needed for nausea or vomiting. 30 tablet 0   Spacer/Aero-Holding Chambers (AEROCHAMBER MV) inhaler Use as instructed 1 each 0   Vitamin D , Ergocalciferol , (DRISDOL ) 1.25 MG (50000 UNIT) CAPS  capsule Take 1 capsule (50,000 Units total) by mouth once a week. 12 capsule 6   folic acid  (FOLVITE ) 1 MG tablet TAKE 1 TABLET BY MOUTH EVERY DAY 90 tablet 1   No current facility-administered medications for this visit.   Facility-Administered Medications Ordered in Other Visits  Medication Dose Route Frequency Provider Last Rate Last Admin   acetaminophen  (TYLENOL ) 325 MG tablet            famotidine  (PEPCID ) 20 MG tablet             PHYSICAL EXAMINATION: ECOG PERFORMANCE STATUS: 1 - Symptomatic but  completely ambulatory BP 138/84 (BP Location: Right Arm, Patient Position: Sitting, Cuff Size: Normal)   Pulse (!) 112   Temp 97.8 F (36.6 C)   Resp 17   Ht 5' 8 (1.727 m)   Wt 139 lb 9.6 oz (63.3 kg)   SpO2 98%   BMI 21.23 kg/m   GENERAL: alert, in acute distress and uncomfortable SKIN: no acute rashes, no significant lesions EYES: conjunctiva are pink and non-injected, sclera anicteric OROPHARYNX: MMM, no exudates, no oropharyngeal erythema or ulceration NECK: supple, no JVD LYMPH: no palpable lymphadenopathy in the cervical, axillary or inguinal regions LUNGS: clear to auscultation b/l with normal respiratory effort HEART: regular rate & rhythm ABDOMEN: normoactive bowel sounds, non tender, not distended,  Extremity: no pedal edema,  PSYCH: alert & oriented x 3 with fluent speech NEURO: no focal motor/sensory deficits  LABORATORY DATA:   I have reviewed the data as listed     Latest Ref Rng & Units 09/26/2024    8:59 AM 09/05/2024   11:13 AM 08/15/2024   11:34 AM  CBC EXTENDED  WBC 4.0 - 10.5 K/uL 17.1  17.0  13.5   RBC 4.22 - 5.81 MIL/uL 2.51  3.13  3.42   Hemoglobin 13.0 - 17.0 g/dL 8.0  9.5  89.5   HCT 60.9 - 52.0 % 23.6  27.9  30.2   Platelets 150 - 400 K/uL 109  179  326   NEUT# 1.7 - 7.7 K/uL 14.1  13.5  8.9   Lymph# 0.7 - 4.0 K/uL 1.5  1.6  2.1       Latest Ref Rng & Units 09/26/2024    8:59 AM 09/05/2024   11:13 AM 08/15/2024   11:34 AM  CMP  Glucose  70 - 99 mg/dL 856  884  895   BUN 8 - 23 mg/dL 22  23  26    Creatinine 0.61 - 1.24 mg/dL 8.67  8.57  8.71   Sodium 135 - 145 mmol/L 141  138  138   Potassium 3.5 - 5.1 mmol/L 4.0  4.3  4.0   Chloride 98 - 111 mmol/L 107  106  108   CO2 22 - 32 mmol/L 24  22  22    Calcium 8.9 - 10.3 mg/dL 9.8  9.0  8.8   Total Protein 6.5 - 8.1 g/dL 7.4  7.4  7.1   Total Bilirubin 0.0 - 1.2 mg/dL 0.3  0.3  0.2   Alkaline Phos 38 - 126 U/L 107  124  117   AST 15 - 41 U/L 15  11  10    ALT 0 - 44 U/L 14  12  17     Review Flowsheet       Latest Ref Rng & Units 05/15/2024  Spirometry  FVC-%Pred-Pre % 89   FVC-%Pred-Post % 85   FEV1-Pre L 1.87   FEV1-%Pred-Pre % 59   FEV1-Post L 1.93   FEV1-%Pred-Post % 61   DLCO unc ml/min/mmHg 8.52    MULTIPLE MYELOMA PANEL 05/2023 - 08/2024 & KAPPA/LAMBDA LIGHT CHAINS 02/2024 - 08/2024    Guardant 360 06/11/2024:               04/29/2024 Cytopathology report:   Fine needle Aspiration, RLL mass - Positive for malignancy, adenocarcinoma Bronchial Brushing, RLL - Positive for malignancy, adenocarcinoma Fine needle aspiration, lymph node station 11L negative for malignancy Fine needle aspiration lymph node 7, station 7, negative for malignancy   03/09/20 ([id number]) [facility]opsy at [facility]    07/02/2019 Bone Marrow Biopsy ([id number]) at [facility]      08/07/18 BM Bx:            RADIOGRAPHIC STUDIES: I have personally reviewed the radiological images as listed and agreed with the findings in the report. NM PET Image Restag (PS) Skull Base To Thigh Result Date: 09/04/2024 CLINICAL DATA:  Subsequent treatment strategy for lung adenocarcinoma. EXAM: NUCLEAR MEDICINE PET SKULL BASE TO THIGH TECHNIQUE: 7.2 mCi F-18 FDG was injected intravenously. Full-ring PET imaging was performed from the skull base to thigh after the radiotracer. CT data was obtained and used for attenuation correction and anatomic localization. Fasting blood  glucose: 109 mg/dl COMPARISON:  FDG PET scan 04/11/2024 FINDINGS: NECK: No hypermetabolic lymph nodes in the neck. Incidental CT findings: None. CHEST: Interval improvement of hypermetabolic nodules the RIGHT lung base. Dominant nodule measures 2.5 cm compared to 2.8 cm with SUV max equal 8.4 compared SUV max equal 11.8. Smaller adjacent nodule less well-defined measuring 9 mm on image 87 compared to 12 mm with SUV max equal 2.2 compared SUV max 5.7. The Dominant nodule remains intensely hypermetabolic Centrilobular emphysema the upper lobes. No additional suspicious nodules. Small peripheral nodule in the RIGHT upper lobe measuring 6 mm is unchanged does not have metabolic activity (image 72). No hypermetabolic mediastinal lymph nodes Incidental CT findings: None. ABDOMEN/PELVIS: No abnormal hypermetabolic activity within the liver, pancreas, adrenal glands, or spleen. No hypermetabolic lymph nodes in the abdomen or pelvis. Incidental CT findings: Atherosclerotic calcification of the aorta. Penile prosthetic noted SKELETON: No focal hypermetabolic activity to suggest skeletal metastasis. Incidental CT findings: From fixation the proximal RIGHT femur IMPRESSION: 1. Interval improvement of hypermetabolic nodules in the RIGHT lower lobe with decrease in size and metabolic activity. The dominant nodule remains intense hypermetabolic. 2. No evidence of metastatic adenopathy in the chest. 3. No evidence of distant metastatic disease. 4. Aortic Atherosclerosis (ICD10-I70.0) and Emphysema (ICD10-J43.9). Electronically Signed   By: Jackquline Boxer M.D.   On: 09/04/2024 08:40    ASSESSMENT & PLAN:   68 y.o. 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    Initial BM Bx  showed >95% involvement with kappa restricted serum free light chains. 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 MoL Cy-    08/07/18 BM Bx revealed normocellular bone marrow with trilineage hematopoiesis and 1% plasma cells which indicates the pt is in remission 08/06/18 PET/CT revealed  Expansile lytic lesion involving the left iliac bone, unchanged. Lytic lesion involving the sternum, unchanged. While both are suspicious for myeloma, neither lesion is FDG avid. No hypermetabolic osseous lesions in the visualized axial and appendicular skeleton. Please note that the bilateral lower extremities were not imaged. No suspicious lymphadenopathy. Spleen is normal in size.     Pt began Ninlaro  3mg  maintenance after pt finished 13 cycles of CyBorD and BM bx showed only 1% plasma cells, then resumed CyBorD and held Ninlaro  in anticipation of his tandem bone marrow transplants beginning in late December 2019 with Dr. Rogue Stall[facility]    11/19/18 High dose chemotherapy with Melphalan 140mg /m2 and autologous stem cell rescue   03/20/19 High dose chemotherapy with Melphalan 200mg /m2 and autologous stem cell rescue. Non-infectious diarrhea, uncomplicated course  otherwise.   07/02/2019 PET scan with results revealing 1. No FDG avid bone lesions are identified. 2.  Asymmetric uptake within the right, greater than left, palatine tonsils with mild fullness of the right tonsillar soft tissue. ENT consult vs attention on follow up suggested. 3.  Innumerable mixed sclerotic and lytic lesions throughout the axial and appendicular skeleton without associated hypermetabolic activity. 4.  Ancillary CT findings as above.   07/02/2019 bone marrow biopsy ([id number])with results showing: Trilineage hematopoieses with maturation. No plasma cell myeloma identified. Mild anemia and No Rouleaux or circulating plasma cells identified in the peripheral blood.   2) s/p Prophylactic IM  nailing for rt femur  completed [facility] at the [facility].  -patient previously reported improvement and is currently progressively back to full time work.   3) Thrombocytopenia resolved    4) Renal insufficiency --likely related to multiple myeloma.  Remained fairly stable with baseline creatinine of 1.5-2.1.   Plan -maintain follow up with nephrology referral to Dr Rayburn [facility]ice [facility] for continued treatment -Pt completed 8 weeks of maverick for hep c with Dr Efrain on 07/08/18.  -Kidney numbers improved. Creatinine in the 1.7-2.2 range    5) h/o tobacco abuse-counseled on smoking cessation -Currently not smoking.   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 PLAN - Patient's labs from today were discussed with him in details. -CBC shows hemoglobin of 9.5 platelet count of 179k and WBC count of 17k with a neutrophil count of 13.5k No overt fever. - CMP is stable with a creatinine of 1.42 Myeloma panel shows no observable M spike and IgG kappa monoclonal protein on IFE Serum kappa lambda free light chains showed normal kappa lambda ratio. PET scan shows no evidence of active myeloma Patient has no evidence of progression of his plasma cell leukemia/multiple myeloma at this time. We shall continue to hold his Revlimid  in the context of his lung cancer treatment currently.  8) Right flank pain and leukocytosis -Leukocytosis could be paraneoplastic or from his G-CSF given with chemotherapy UA and UCx were done UA was within normal limits Urine culture shows no evidence of urinary tract infection  9) right thigh pain -Following with Dr. Sharl from [facility] for evaluation and management. - Now resolved with right hip intra-articular steroid injection  10) Recently diagnosed lung adenocarcinoma stage IIIA Staging Breakdown:  T category:  Mass >3 cm but <=5 cm = T2a.  BUT  presence of a separate nodule in the same lobe upstages to T3.  N category:  Hilar (ipsilateral peribronchial or intrapulmonary) node = N1.  M category:  No clear distant metastasis described = M0.  Overall Stage (NSCLC, AJCC 8th ed.):  T3 N1 M0 ? Stage IIIA.  PLAN: - Discussed lab results on 09/26/2024 in detail with patient: CBC showed WBC of 17.1K increased from 17.0K, RBC 2.51 decreased from 3.13, Hemoglobin of 8.0 decreased from 9.5, and PLTs of 109K decreased from 179K.  Low blood counts are likely related to his chemotherapy. CMP with Creatinine 1.32 decreased from 1.42. Stable CKD.  - Reviewed independently Myeloma panel with M protein 0.2 and Kappa/Lambda Lights Chains stable. Guardant360 pending  - Completed last cycle planned cycle of neoadjuvant chemotherapy. - Discussed options and pros and cons of consideration of surgery versus consolidative radiation followed by immunotherapy. Patient is hesitant to pursue surgery was willing to be referred to radiation oncology for discussion regarding  this treatment.  He has seen Dr. Kerrin previously. We discussed that if multiple myeloma was not a consideration the standard treatment after neoadjuvant chemoimmunotherapy would be a consideration of surgery. His PET scan has shown good response to treatment evidence of residual regional disease. Discussed vaccine advisories  - Suggested taking separate Vitamin B and Vitamin E -Does appear to have a upper respiratory infection and we will treat with with Augmentin + cough suppressants    FOLLOW-UP: RTC ith Dr Onesimo with labs in 4 weeks   .The total time spent in the appointment was 40 minutes* .  All of the patient's questions were answered with apparent satisfaction. The patient knows to call the clinic with any problems, questions or concerns.   Emaline Onesimo MD MS AAHIVMS [facility] Hematology/Oncology Physician [facility]  .*Total Encounter Time as  defined by the Centers for Medicare and Medicaid Services includes, in addition to the face-to-face time of a patient visit (documented in the note above) non-face-to-face time: obtaining and reviewing outside history, ordering and reviewing medications, tests or procedures, care coordination (communications with other health care professionals or caregivers) and documentation in the medical record.  I,Emily Lagle,acting as a neurosurgeon for Emaline Onesimo, MD.,have documented all relevant documentation on the behalf of Emaline Onesimo, MD,as directed by  Emaline Onesimo, MD while in the presence of Emaline Onesimo, MD.  I have reviewed the above documentation for accuracy and completeness, and I agree with the above.  Gautam Kale, MD

## 2024-09-27 LAB — T4: T4, Total: 7.7 ug/dL (ref 4.5–12.0)

## 2024-09-29 ENCOUNTER — Other Ambulatory Visit: Payer: Self-pay | Admitting: Hematology

## 2024-09-29 DIAGNOSIS — C9010 Plasma cell leukemia not having achieved remission: Secondary | ICD-10-CM

## 2024-09-29 DIAGNOSIS — C3490 Malignant neoplasm of unspecified part of unspecified bronchus or lung: Secondary | ICD-10-CM

## 2024-09-29 LAB — KAPPA/LAMBDA LIGHT CHAINS
Kappa free light chain: 15.9 mg/L (ref 3.3–19.4)
Kappa, lambda light chain ratio: 1.05 (ref 0.26–1.65)
Lambda free light chains: 15.2 mg/L (ref 5.7–26.3)

## 2024-10-01 NOTE — Progress Notes (Signed)
 Thoracic Location of Tumor / Histology: Right Lower Lobe Lung   Patient has been under treatment for plasma cell leukemia since 2018 and has been receiving chemotherapy for lung cancer since July 2025.  He is seen today to discuss proceeding with radiation therapy to the right lower lobe lung.  PET 09/04/2024:  Interval improvement of hypermetabolic nodules in the RIGHT lower lobe with decrease in size and metabolic activity. The dominant nodule remains intense hypermetabolic.  Biopsies of   Past/Anticipated interventions by pulmonary, if any:  Past/Anticipated interventions by cardiothoracic surgery, if any:    Past/Anticipated interventions by medical oncology, if any:  Dr. Onesimo 08/15/2024 -Patient notes no further toxicities from his previous cycle of chemotherapy and is appropriate to proceed with cycle 3 carboplatin /pemetrexed /pembrolizumab  neoadjuvant treatment. - Will get PET scan after this cycle of treatment and touch base with Dr. Kerrin regarding possibility of/candidacy for surgery based on response and PFTs. - If surgery is not a possibility will refer the patient to radiation oncology. - Will plan to restart maintenance treatment for his plasma cell leukemia/multiple myeloma after completion of surgery/radiation for lung cancer.   Tobacco/Marijuana/Snuff/ETOH use:   Signs/Symptoms Weight changes, if any:  Respiratory complaints, if any:  Hemoptysis, if any:  Pain issues, if any:    SAFETY ISSUES: Prior radiation?  Pacemaker/ICD?   Possible current pregnancy? Is the patient on methotrexate?   Current Complaints / other details:

## 2024-10-02 LAB — MULTIPLE MYELOMA PANEL, SERUM
Albumin SerPl Elph-Mcnc: 3.1 g/dL (ref 2.9–4.4)
Albumin/Glob SerPl: 0.9 (ref 0.7–1.7)
Alpha 1: 0.4 g/dL (ref 0.0–0.4)
Alpha2 Glob SerPl Elph-Mcnc: 1.5 g/dL — ABNORMAL HIGH (ref 0.4–1.0)
B-Globulin SerPl Elph-Mcnc: 1 g/dL (ref 0.7–1.3)
Gamma Glob SerPl Elph-Mcnc: 0.7 g/dL (ref 0.4–1.8)
Globulin, Total: 3.6 g/dL (ref 2.2–3.9)
IgA: 108 mg/dL (ref 61–437)
IgG (Immunoglobin G), Serum: 875 mg/dL (ref 603–1613)
IgM (Immunoglobulin M), Srm: 17 mg/dL — ABNORMAL LOW (ref 20–172)
M Protein SerPl Elph-Mcnc: 0.2 g/dL — ABNORMAL HIGH
Total Protein ELP: 6.7 g/dL (ref 6.0–8.5)

## 2024-10-03 ENCOUNTER — Encounter: Payer: Self-pay | Admitting: Radiation Oncology

## 2024-10-03 ENCOUNTER — Ambulatory Visit
Admission: RE | Admit: 2024-10-03 | Discharge: 2024-10-03 | Disposition: A | Discharge status: Home / Self Care | Source: Ambulatory Visit | Attending: Radiation Oncology | Admitting: Radiation Oncology

## 2024-10-03 VITALS — BP 142/88 | HR 105 | Temp 98.6°F | Resp 16 | Ht 68.0 in | Wt 140.4 lb

## 2024-10-03 DIAGNOSIS — N400 Enlarged prostate without lower urinary tract symptoms: Secondary | ICD-10-CM | POA: Insufficient documentation

## 2024-10-03 DIAGNOSIS — I700 Atherosclerosis of aorta: Secondary | ICD-10-CM | POA: Insufficient documentation

## 2024-10-03 DIAGNOSIS — Z79899 Other long term (current) drug therapy: Secondary | ICD-10-CM | POA: Insufficient documentation

## 2024-10-03 DIAGNOSIS — K209 Esophagitis, unspecified without bleeding: Secondary | ICD-10-CM | POA: Insufficient documentation

## 2024-10-03 DIAGNOSIS — Z800 Family history of malignant neoplasm of digestive organs: Secondary | ICD-10-CM | POA: Diagnosis not present

## 2024-10-03 DIAGNOSIS — C3431 Malignant neoplasm of lower lobe, right bronchus or lung: Secondary | ICD-10-CM | POA: Insufficient documentation

## 2024-10-03 DIAGNOSIS — C9001 Multiple myeloma in remission: Secondary | ICD-10-CM | POA: Insufficient documentation

## 2024-10-03 DIAGNOSIS — Z7961 Long term (current) use of immunomodulator: Secondary | ICD-10-CM | POA: Insufficient documentation

## 2024-10-03 DIAGNOSIS — R6510 Systemic inflammatory response syndrome (SIRS) of non-infectious origin without acute organ dysfunction: Secondary | ICD-10-CM | POA: Diagnosis not present

## 2024-10-03 DIAGNOSIS — J432 Centrilobular emphysema: Secondary | ICD-10-CM | POA: Diagnosis not present

## 2024-10-03 DIAGNOSIS — I10 Essential (primary) hypertension: Secondary | ICD-10-CM | POA: Insufficient documentation

## 2024-10-03 DIAGNOSIS — D696 Thrombocytopenia, unspecified: Secondary | ICD-10-CM | POA: Insufficient documentation

## 2024-10-03 DIAGNOSIS — Z8619 Personal history of other infectious and parasitic diseases: Secondary | ICD-10-CM | POA: Diagnosis not present

## 2024-10-03 DIAGNOSIS — J449 Chronic obstructive pulmonary disease, unspecified: Secondary | ICD-10-CM | POA: Diagnosis not present

## 2024-10-03 DIAGNOSIS — Z87891 Personal history of nicotine dependence: Secondary | ICD-10-CM | POA: Diagnosis not present

## 2024-10-03 NOTE — Progress Notes (Signed)
 Radiation Oncology         [phone]) [phone] ________________________________  Name: Andres Fernandez        MRN: [id number]  Date of Service: 10/03/2024 DOB: [birth year]  RR:Tzody, Vina HERO, NP-C (Inactive)  Andres Emaline Brink, MD     REFERRING PHYSICIAN: [name], MD   DIAGNOSIS: The encounter diagnosis was Malignant neoplasm of lower lobe of right lung [facility]).    HISTORY OF PRESENT ILLNESS: Andres Fernandez is a 68 y.o. male with multiple myeloma currently in remission; s/p tandem Auto HSCT seen at the request of Dr. Onesimo for lung cancer.    Patient was originally diagnosed with stage IIIA (cT3, N1, M0) NSCLC, adenocarcinoma of the right lower lobe in May of 2025. He originally presented with a nodular opacity at the right lung base on CXR after having a chronic productive cough. Subsequent PET on 04/11/2024 demonstrated a hypermetabolic spiculated right lower lobe pulmonary nodule; a satellite nodule; and a right hilar hypermetabolic lymph node.  Biopsy of the right lower lobe mass on 04/29/2024 revealed lung adenocarcinoma.    He underwent 3 cycles of neoadjuvant carboplatin  pemetrexed  pembrolizumab  with good response to treatment and no notable toxicities under the care of Dr. Onesimo. Most recent PET scan on 09/04/2024 demonstrates interval improvement of hypermetabolic nodules in the right lower lobe with decrease in size and metabolic activity; no evidence of metastatic adenopathy in the chest; and no evidence of distant metastatic disease.   The patient was kindly referred to us  to discuss radiation treatment options.   Patient reports to be doing well overall today. He notes some shortness of breath with activity. He denies any chest pain, pressure or tightness and denies any hemoptysis. He notes good energy levels.    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   SIRS (systemic inflammatory response syndrome) (HCC)    Thrombocytopenia    Tobacco abuse        PAST SURGICAL HISTORY: Past Surgical History:  Procedure Laterality Date   APPENDECTOMY     BRONCHOSCOPY, WITH BIOPSY USING ELECTROMAGNETIC NAVIGATION Bilateral 04/29/2024   Procedure: ROBOTIC ASSISTED NAVIGATIONAL BRONCHOSCOPY;  Surgeon: Isadora Hose, MD;  Location: [facility] ORS;  Service: Pulmonary;  Laterality: Bilateral;   ENDOBRONCHIAL ULTRASOUND Bilateral 04/29/2024   Procedure: ENDOBRONCHIAL ULTRASOUND (EBUS);  Surgeon: Isadora Hose, MD;  Location: [facility] ORS;  Service: Pulmonary;  Laterality: Bilateral;   FEMUR IM NAIL Right 11/20/2017   Procedure: RIGHT FEMORAL INTRAMEDULLARY (IM) NAIL;  Surgeon: Sharl Selinda Dover, MD;  Location: [facility] OR;  Service: Orthopedics;  Laterality: Right;   IR IMAGING GUIDED PORT INSERTION  08/19/2021   PENILE PROSTHESIS IMPLANT       FAMILY HISTORY:  Family History  Problem Relation Age of Onset   COPD Mother    Liver cancer Mother      SOCIAL HISTORY:  reports that he has been smoking cigarettes. He started smoking about 5 months ago. He has a 0.1 pack-year smoking history. He has never used smokeless tobacco. He reports that he does not currently use alcohol. He reports that he does not currently use  drugs.   ALLERGIES: Patient has no known allergies.   MEDICATIONS:  Current Outpatient Medications  Medication Sig Dispense Refill   acyclovir  (ZOVIRAX ) 400 MG tablet Take 1 tablet (400 mg total) by mouth daily. 90 tablet 6   albuterol  (VENTOLIN  HFA) 108 (90 Base) MCG/ACT inhaler Inhale 2 puffs into the lungs every 6 (six) hours as needed for shortness of breath.     apixaban  (ELIQUIS ) 5 MG TABS tablet Take 1 tablet (5 mg  total) by mouth 2 (two) times daily. 60 tablet 5   dexamethasone  (DECADRON ) 4 MG tablet Take 2 tablets (8 mg total) by mouth daily. Take 2 tablets (8 mg total) by mouth daily with food for 3 days after each carbo treatment 24 tablet 2   ferrous sulfate  325 (65 FE) MG EC tablet TAKE 1 TABLET BY MOUTH EVERY DAY WITH BREAKFAST 90 tablet 1   Fluticasone Propionate , Inhal, (FLUTICASONE PROPIONATE  DISKUS) 250 MCG/ACT AEPB TAKE 1 PUFF BY MOUTH EVERY DAY 60 each 1   folic acid  (FOLVITE ) 1 MG tablet TAKE 1 TABLET BY MOUTH EVERY DAY 90 tablet 1   gabapentin  (NEURONTIN ) 100 MG capsule Take 2 capsules (200 mg total) by mouth 3 (three) times daily. 60 capsule 1   lenalidomide  (REVLIMID ) 10 MG capsule Take 1 capsule (10 mg total) by mouth daily. Take 1 capsule (10 mg total) by mouth daily for 21 days. Take 7 days off. Repeat. 21 capsule 0   lidocaine -prilocaine  (EMLA ) cream Apply 1 Application topically as needed. Apply to Port-A-Cath at least 1 hr prior to treatment. 30 g 0   methocarbamol  (ROBAXIN ) 500 MG tablet Take 1 tablet (500 mg total) by mouth 2 (two) times daily. 20 tablet 0   Multiple Vitamins-Minerals (MULTIVITAMIN WITH MINERALS) tablet Take 1 tablet by mouth at bedtime.     naproxen  (NAPROSYN ) 375 MG tablet Take 1 tablet (375 mg total) by mouth 2 (two) times daily. 20 tablet 0   nicotine  (NICODERM CQ  - DOSED IN MG/24 HOURS) 14 mg/24hr patch PLACE 1 PATCH (14 MG TOTAL) ONTO THE SKIN DAILY. 28 patch 1   ondansetron  (ZOFRAN ) 8 MG tablet Take 1 tablet (8 mg total) by mouth every 8 (eight) hours as needed for nausea or vomiting. 20 tablet 0   oxyCODONE -acetaminophen  (PERCOCET/ROXICET) 5-325 MG tablet Take 1 tablet by mouth every 6 (six) hours as needed for severe pain (pain score 7-10). 8 tablet 0   prochlorperazine  (COMPAZINE ) 10 MG tablet Take 1 tablet (10 mg total) by mouth every 6 (six) hours as needed for nausea or vomiting. 30 tablet 0   Spacer/Aero-Holding Chambers (AEROCHAMBER MV) inhaler Use as  instructed 1 each 0   Vitamin D , Ergocalciferol , (DRISDOL ) 1.25 MG (50000 UNIT) CAPS capsule Take 1 capsule (50,000 Units total) by mouth once a week. 12 capsule 6   No current facility-administered medications for this encounter.   Facility-Administered Medications Ordered in Other Encounters  Medication Dose Route Frequency Provider Last Rate Last Admin   acetaminophen  (TYLENOL ) 325 MG tablet            famotidine  (PEPCID ) 20 MG tablet              REVIEW OF SYSTEMS: Notable for that above.      PHYSICAL EXAM:  Wt Readings from Last 3 Encounters:  10/03/24 140 lb 6.4 oz (63.7 kg)  09/26/24 139 lb 9.6 oz (63.3 kg)  09/05/24 142 lb 9.6 oz (64.7 kg)   Temp Readings from Last 3 Encounters:  10/03/24  98.6 F (37 C) (Oral)  09/26/24 97.8 F (36.6 C)  09/08/24 98.3 F (36.8 C) (Oral)   BP Readings from Last 3 Encounters:  10/03/24 (!) 142/88  09/26/24 138/84  09/08/24 120/72   Pulse Readings from Last 3 Encounters:  10/03/24 (!) 105  09/26/24 (!) 112  09/08/24 92   Pain Assessment Pain Score: 3  Pain Loc: Leg (Right)/10  In general this is a well appearing male in no acute distress. He's alert and oriented x4 and appropriate throughout the examination. Cardiopulmonary assessment is negative for acute distress and he exhibits normal effort.     ECOG = 0  0 - Asymptomatic (Fully active, able to carry on all predisease activities without restriction)  1 - Symptomatic but completely ambulatory (Restricted in physically strenuous activity but ambulatory and able to carry out work of a light or sedentary nature. For example, light housework, office work)  2 - Symptomatic, <50% in bed during the day (Ambulatory and capable of all self care but unable to carry out any work activities. Up and about more than 50% of waking hours)  3 - Symptomatic, >50% in bed, but not bedbound (Capable of only limited self-care, confined to bed or chair 50% or more of waking hours)  4 -  Bedbound (Completely disabled. Cannot carry on any self-care. Totally confined to bed or chair)  5 - Death   Raylene MM, Creech RH, Tormey DC, et al. [phone]). Toxicity and response criteria of the [facility]. Am. DOROTHA Bridges. Oncol. 5 (6): 649-55    LABORATORY DATA:  Lab Results  Component Value Date   WBC 17.1 (H) 09/26/2024   HGB 8.0 (L) 09/26/2024   HCT 23.6 (L) 09/26/2024   MCV 94.0 09/26/2024   PLT 109 (L) 09/26/2024   Lab Results  Component Value Date   NA 141 09/26/2024   K 4.0 09/26/2024   CL 107 09/26/2024   CO2 24 09/26/2024   Lab Results  Component Value Date   ALT 14 09/26/2024   AST 15 09/26/2024   GGT 46 04/11/2018   ALKPHOS 107 09/26/2024   BILITOT 0.3 09/26/2024      RADIOGRAPHY: NM PET Image Restag (PS) Skull Base To Thigh Result Date: 09/04/2024 CLINICAL DATA:  Subsequent treatment strategy for lung adenocarcinoma. EXAM: NUCLEAR MEDICINE PET SKULL BASE TO THIGH TECHNIQUE: 7.2 mCi F-18 FDG was injected intravenously. Full-ring PET imaging was performed from the skull base to thigh after the radiotracer. CT data was obtained and used for attenuation correction and anatomic localization. Fasting blood glucose: 109 mg/dl COMPARISON:  FDG PET scan 04/11/2024 FINDINGS: NECK: No hypermetabolic lymph nodes in the neck. Incidental CT findings: None. CHEST: Interval improvement of hypermetabolic nodules the RIGHT lung base. Dominant nodule measures 2.5 cm compared to 2.8 cm with SUV max equal 8.4 compared SUV max equal 11.8. Smaller adjacent nodule less well-defined measuring 9 mm on image 87 compared to 12 mm with SUV max equal 2.2 compared SUV max 5.7. The Dominant nodule remains intensely hypermetabolic Centrilobular emphysema the upper lobes. No additional suspicious nodules. Small peripheral nodule in the RIGHT upper lobe measuring 6 mm is unchanged does not have metabolic activity (image 72). No hypermetabolic mediastinal lymph nodes Incidental CT  findings: None. ABDOMEN/PELVIS: No abnormal hypermetabolic activity within the liver, pancreas, adrenal glands, or spleen. No hypermetabolic lymph nodes in the abdomen or pelvis. Incidental CT findings: Atherosclerotic calcification of the aorta. Penile prosthetic noted SKELETON: No focal hypermetabolic activity to suggest skeletal  metastasis. Incidental CT findings: From fixation the proximal RIGHT femur IMPRESSION: 1. Interval improvement of hypermetabolic nodules in the RIGHT lower lobe with decrease in size and metabolic activity. The dominant nodule remains intense hypermetabolic. 2. No evidence of metastatic adenopathy in the chest. 3. No evidence of distant metastatic disease. 4. Aortic Atherosclerosis (ICD10-I70.0) and Emphysema (ICD10-J43.9). Electronically Signed   By: Jackquline Boxer M.D.   On: 09/04/2024 08:40       IMPRESSION/PLAN: 1. Stage IIIA (cT3, N1, M0) NSCLC, adenocarcinoma of the right lower lobe; s/p neoadjuvant immunochemotherapy  We reviewed this patient's current work-up. He presents stage IIIA (cT3, N1, M0) NSCLC, adenocarcinoma of the right lower lobe. He underwent neoadjuvant immunochemotherapy with PET demonstrating a good response to his treatment. We talked about treatment options of radiation verus surgery.   If patient would like to proceed with radiation, Dr. Dewey recommends a definitive course of radiation targeted at the RLL pulmonary nodule and previously hypermetabolic right hilar node. We would defer to medical oncology for the decision to have radiation given concurrently with chemotherapy.We reviewed the anticipated acute and late sequelae associated with radiation in this setting. Side effects include, but are not limited to; skin irritation, fatigue, cough, esophagitis, rib fragility, and lung scarring. The patient was encouraged to ask questions that I answered to the best of my ability.   After extensive discussion, the patient would like to proceed with  surgery, if possible. We will share our discussion with Dr. Kerrin and Dr. Onesimo and refer the patient for consideration of surgery.   We appreciate the consult and are happy to re-discuss radiation if he is not a surgical candidate. Of note, patient would prefer to be treated closer to home in [place], if he does need radiation.    In a visit lasting 60 minutes, greater than 50% of the time was spent face to face discussing the patient's condition, in preparation for the discussion, and coordinating the patient's care.   The above documentation reflects my direct findings during this shared patient visit. Please see the separate note by Dr. Dewey on this date for the remainder of the patient's plan of care.    Leeroy Due, PA-C    **Disclaimer: This note was dictated with voice recognition software. Similar sounding words can inadvertently be transcribed and this note may contain transcription errors which may not have been corrected upon publication of note.**

## 2024-10-04 ENCOUNTER — Encounter: Payer: Self-pay | Admitting: Hematology

## 2024-10-06 ENCOUNTER — Encounter: Payer: Self-pay | Admitting: Hematology

## 2024-10-06 LAB — GUARDANT 360

## 2024-10-07 ENCOUNTER — Other Ambulatory Visit: Payer: Self-pay | Admitting: Radiology

## 2024-10-07 ENCOUNTER — Telehealth: Payer: Self-pay | Admitting: Radiation Oncology

## 2024-10-07 DIAGNOSIS — C3431 Malignant neoplasm of lower lobe, right bronchus or lung: Secondary | ICD-10-CM

## 2024-10-07 NOTE — Telephone Encounter (Signed)
 10/28 Outgoing referral faxed to Dr. Chrystal ref coord - Devere Silvan.

## 2024-10-08 ENCOUNTER — Other Ambulatory Visit: Payer: Self-pay

## 2024-10-13 ENCOUNTER — Encounter: Payer: Self-pay | Admitting: Radiology

## 2024-10-16 ENCOUNTER — Other Ambulatory Visit: Payer: Self-pay

## 2024-10-17 ENCOUNTER — Inpatient Hospital Stay: Admitting: Physician Assistant

## 2024-10-17 ENCOUNTER — Inpatient Hospital Stay

## 2024-10-17 ENCOUNTER — Inpatient Hospital Stay: Admitting: Nutrition

## 2024-10-28 ENCOUNTER — Encounter: Admitting: Thoracic Surgery (Cardiothoracic Vascular Surgery)

## 2024-10-28 ENCOUNTER — Ambulatory Visit
Attending: Thoracic Surgery (Cardiothoracic Vascular Surgery) | Admitting: Thoracic Surgery (Cardiothoracic Vascular Surgery)

## 2024-10-28 VITALS — BP 145/89 | HR 100 | Resp 18 | Ht 68.0 in | Wt 147.0 lb

## 2024-10-28 DIAGNOSIS — R918 Other nonspecific abnormal finding of lung field: Secondary | ICD-10-CM | POA: Diagnosis not present

## 2024-10-28 NOTE — Addendum Note (Signed)
 Addended by: Donnell Wion C on: 10/28/2024 09:57 AM   Modules accepted: Level of Service

## 2024-10-28 NOTE — Progress Notes (Signed)
 PCP is Dia Vina HERO, NP-C Referring Provider is Wyatt Leeroy HERO, PA-C  Chief Complaint  Patient presents with   Lung Cancer    Surgical consult,/ PET scan 09/04/24/ PFT's 05/15/24/ Bronch 04/29/24     HPI: Mr. Andres Fernandez was sent for consultation regarding possible lung resection.  Andres Fernandez is a 68 year old man with a past medical history significant for tobacco use, COPD, multiple myeloma, plasma cell leukemia, hepatitis C, acute kidney injury, BPH, thrombocytopenia, and recently diagnosed stage IIIA (T3,N1) adenocarcinoma of the lung.  He smoked about a half a pack of cigarettes a day for 45 years prior to quitting recently.  He works as a museum/gallery exhibitions officer.  No chest pain, pressure, tightness, or shortness of breath.  Last spring he had a PET/CT for follow-up of his multi myeloma for which he is on maintenance therapy.  He was found to have a right lower lobe mass which was hypermetabolic.  There also was a satellite nodule, hilar adenopathy, and borderline mediastinal adenopathy.  Robotic bronchoscopy and endobronchial ultrasound showed adenocarcinoma.  Level 7 and 11 nodes were negative.  He underwent neoadjuvant chemoimmunotherapy with carboplatin , pemetrexed , and pembrolizumab .  Had a good partial response.  His follow-up PET showed decrease size of the lung mass although still hypermetabolic.  No activity at the satellite nodule or the hilar or mediastinal lymph nodes.  Zubrod Score: At the time of surgery this patient's most appropriate activity status/level should be described as: []     0    Normal activity, no symptoms [x]     1    Restricted in physical strenuous activity but ambulatory, able to do out light work []     2    Ambulatory and capable of self care, unable to do work activities, up and about >50 % of waking hours                              []     3    Only limited self care, in bed greater than 50% of waking hours []     4    Completely disabled, no self care, confined to  bed or chair []     5    Moribund   Past Medical History:  Diagnosis Date   AKI (acute kidney injury)    ARF (acute renal failure)    BPH (benign prostatic hyperplasia)    Cancer (HCC)    COPD (chronic obstructive pulmonary disease) (HCC)    Hepatitis C 11/27/2017   Hypertension    Multiple myeloma (HCC)    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   SIRS (systemic inflammatory response syndrome) (HCC)    Thrombocytopenia    Tobacco abuse     Past Surgical History:  Procedure Laterality Date   APPENDECTOMY     BRONCHOSCOPY, WITH BIOPSY USING ELECTROMAGNETIC NAVIGATION Bilateral 04/29/2024   Procedure: ROBOTIC ASSISTED NAVIGATIONAL BRONCHOSCOPY;  Surgeon: Isadora Hose, MD;  Location: [facility];  Service: Pulmonary;  Laterality: Bilateral;   ENDOBRONCHIAL ULTRASOUND Bilateral 04/29/2024   Procedure: ENDOBRONCHIAL ULTRASOUND (EBUS);  Surgeon: Isadora Hose, MD;  Location: [facility];  Service: Pulmonary;  Laterality: Bilateral;   FEMUR IM NAIL Right 11/20/2017   Procedure: RIGHT FEMORAL INTRAMEDULLARY (IM) NAIL;  Surgeon: Sharl Selinda Dover, MD;  Location: [facility];  Service: Orthopedics;  Laterality: Right;   IR IMAGING GUIDED PORT INSERTION  08/19/2021   PENILE PROSTHESIS IMPLANT      Family History  Problem Relation  Age of Onset   COPD Mother    Liver cancer Mother     Social History Social History   Tobacco Use   Smoking status: Some Days    Current packs/day: 0.25    Average packs/day: 0.3 packs/day for 0.5 years (0.1 ttl pk-yrs)    Types: Cigarettes    Start date: 04/16/2024    Last attempt to quit: 11/09/2017   Smokeless tobacco: Never   Tobacco comments:    Smokes a cigarette every once in a while starting about 2 weeks ago - 04/23/24  Vaping Use   Vaping status: Never Used  Substance Use Topics   Alcohol use: Not Currently   Drug use: Not Currently    Current Outpatient Medications  Medication Sig Dispense Refill   acyclovir  (ZOVIRAX ) 400 MG tablet Take 1  tablet (400 mg total) by mouth daily. 90 tablet 6   albuterol  (VENTOLIN  HFA) 108 (90 Base) MCG/ACT inhaler Inhale 2 puffs into the lungs every 6 (six) hours as needed for shortness of breath.     apixaban  (ELIQUIS ) 5 MG TABS tablet Take 1 tablet (5 mg total) by mouth 2 (two) times daily. 60 tablet 5   dexamethasone  (DECADRON ) 4 MG tablet Take 2 tablets (8 mg total) by mouth daily. Take 2 tablets (8 mg total) by mouth daily with food for 3 days after each carbo treatment 24 tablet 2   ferrous sulfate  325 (65 FE) MG EC tablet TAKE 1 TABLET BY MOUTH EVERY DAY WITH BREAKFAST 90 tablet 1   Fluticasone Propionate , Inhal, (FLUTICASONE PROPIONATE  DISKUS) 250 MCG/ACT AEPB TAKE 1 PUFF BY MOUTH EVERY DAY 60 each 1   folic acid  (FOLVITE ) 1 MG tablet TAKE 1 TABLET BY MOUTH EVERY DAY 90 tablet 1   gabapentin  (NEURONTIN ) 100 MG capsule Take 2 capsules (200 mg total) by mouth 3 (three) times daily. 60 capsule 1   lenalidomide  (REVLIMID ) 10 MG capsule Take 1 capsule (10 mg total) by mouth daily. Take 1 capsule (10 mg total) by mouth daily for 21 days. Take 7 days off. Repeat. 21 capsule 0   lidocaine -prilocaine  (EMLA ) cream Apply 1 Application topically as needed. Apply to Port-A-Cath at least 1 hr prior to treatment. 30 g 0   methocarbamol  (ROBAXIN ) 500 MG tablet Take 1 tablet (500 mg total) by mouth 2 (two) times daily. 20 tablet 0   Multiple Vitamins-Minerals (MULTIVITAMIN WITH MINERALS) tablet Take 1 tablet by mouth at bedtime.     naproxen  (NAPROSYN ) 375 MG tablet Take 1 tablet (375 mg total) by mouth 2 (two) times daily. 20 tablet 0   nicotine  (NICODERM CQ  - DOSED IN MG/24 HOURS) 14 mg/24hr patch PLACE 1 PATCH (14 MG TOTAL) ONTO THE SKIN DAILY. 28 patch 1   ondansetron  (ZOFRAN ) 8 MG tablet Take 1 tablet (8 mg total) by mouth every 8 (eight) hours as needed for nausea or vomiting. 20 tablet 0   oxyCODONE -acetaminophen  (PERCOCET/ROXICET) 5-325 MG tablet Take 1 tablet by mouth every 6 (six) hours as needed for  severe pain (pain score 7-10). 8 tablet 0   prochlorperazine  (COMPAZINE ) 10 MG tablet Take 1 tablet (10 mg total) by mouth every 6 (six) hours as needed for nausea or vomiting. 30 tablet 0   Spacer/Aero-Holding Chambers (AEROCHAMBER MV) inhaler Use as instructed 1 each 0   Vitamin D , Ergocalciferol , (DRISDOL ) 1.25 MG (50000 UNIT) CAPS capsule Take 1 capsule (50,000 Units total) by mouth once a week. 12 capsule 6   No current facility-administered medications for  this visit.   Facility-Administered Medications Ordered in Other Visits  Medication Dose Route Frequency Provider Last Rate Last Admin   acetaminophen  (TYLENOL ) 325 MG tablet            famotidine  (PEPCID ) 20 MG tablet             No Known Allergies  Review of Systems  BP (!) 145/89 (BP Location: Left Arm)   Pulse 100   Resp 18   Ht 5' 8 (1.727 m)   Wt 147 lb (66.7 kg)   SpO2 100%   BMI 22.35 kg/m  Physical Exam Vitals reviewed.  Constitutional:      Appearance: Normal appearance.  HENT:     Head: Normocephalic and atraumatic.  Eyes:     General: No scleral icterus.    Extraocular Movements: Extraocular movements intact.  Cardiovascular:     Rate and Rhythm: Normal rate and regular rhythm.     Heart sounds: Normal heart sounds. No murmur heard.    No friction rub. No gallop.  Pulmonary:     Effort: Pulmonary effort is normal. No respiratory distress.     Breath sounds: Normal breath sounds. No wheezing.  Abdominal:     Palpations: Abdomen is soft.  Musculoskeletal:     Cervical back: Neck supple.  Lymphadenopathy:     Cervical: No cervical adenopathy.  Skin:    General: Skin is warm and dry.  Neurological:     General: No focal deficit present.     Mental Status: He is alert and oriented to person, place, and time.     Cranial Nerves: No cranial nerve deficit.     Motor: No weakness.    Diagnostic Tests: NUCLEAR MEDICINE PET SKULL BASE TO THIGH   TECHNIQUE: 7.2 mCi F-18 FDG was injected  intravenously. Full-ring PET imaging was performed from the skull base to thigh after the radiotracer. CT data was obtained and used for attenuation correction and anatomic localization.   Fasting blood glucose: 109 mg/dl   COMPARISON:  FDG PET scan 04/11/2024   FINDINGS: NECK: No hypermetabolic lymph nodes in the neck.   Incidental CT findings: None.   CHEST: Interval improvement of hypermetabolic nodules the RIGHT lung base. Dominant nodule measures 2.5 cm compared to 2.8 cm with SUV max equal 8.4 compared SUV max equal 11.8. Smaller adjacent nodule less well-defined measuring 9 mm on image 87 compared to 12 mm with SUV max equal 2.2 compared SUV max 5.7. The Dominant nodule remains intensely hypermetabolic   Centrilobular emphysema the upper lobes. No additional suspicious nodules.   Small peripheral nodule in the RIGHT upper lobe measuring 6 mm is unchanged does not have metabolic activity (image 72).   No hypermetabolic mediastinal lymph nodes   Incidental CT findings: None.   ABDOMEN/PELVIS: No abnormal hypermetabolic activity within the liver, pancreas, adrenal glands, or spleen. No hypermetabolic lymph nodes in the abdomen or pelvis.   Incidental CT findings: Atherosclerotic calcification of the aorta. Penile prosthetic noted   SKELETON: No focal hypermetabolic activity to suggest skeletal metastasis.   Incidental CT findings: From fixation the proximal RIGHT femur   IMPRESSION: 1. Interval improvement of hypermetabolic nodules in the RIGHT lower lobe with decrease in size and metabolic activity. The dominant nodule remains intense hypermetabolic. 2. No evidence of metastatic adenopathy in the chest. 3. No evidence of distant metastatic disease. 4. Aortic Atherosclerosis (ICD10-I70.0) and Emphysema (ICD10-J43.9).     Electronically Signed   By: Jackquline Malva HERO.D.  On: 09/04/2024 08:40   I personally reviewed the PET/CT images.  Decreased size but  persistent hypermetabolic activity in primary nodule.  No significant metabolic activity in other areas compared to previous scan.  Consistent with a partial response.  Pulmonary function testing 05/15/2024 FVC 3.79 (89%) FEV1 1.87 (59%) FEV1 postbronchodilator 1.93 (61%) DLCO 8.52 (34%)/ DL/VA 61% of predicted   Impression: Andres Fernandez is a 68 year old man with a past medical history significant for tobacco use, COPD, multiple myeloma, plasma cell leukemia, hepatitis C, acute kidney injury, BPH, thrombocytopenia, and recently diagnosed stage IIIA (T3,N1) adenocarcinoma of the lung.  Adenocarcinoma of the lung-clinical stage IIIa (T3, N1).  Status post neoadjuvant chemoimmunotherapy with decreased size of the primary nodule and resolution of the other findings.  Still needs local therapy.  Options are surgery versus radiation therapy.  Reviewing his films he would require a lobectomy, or possibly bilobectomy.  Unfortunately his diffusion capacity is already very limited.  He also has significant emphysema in the upper lobe so his lower lobe is probably responsible for a larger percentage of his diffusion then would normally be the case.  Given that I do not think he would be a good candidate for lobectomy.  Unfortunately a sublobar resection would not be effective in his case.  I discussed these issues with Mr. Mrs. Franssen.  I recommended that he follow-up with Dr. Dewey to initiate radiation therapy.  Plan: Follow-up with Dr. Dewey and Dr. Onesimo I will be happy to see Mr. Zito again anytime in the future if I can be of any assistance with his care.  I spent 30 minutes in review of records, images, and in consultation with Mr. Noreen today.  Andres Fernandez Millers, MD [facility] [phone]

## 2024-11-03 ENCOUNTER — Ambulatory Visit
Admission: RE | Admit: 2024-11-03 | Discharge: 2024-11-03 | Disposition: A | Discharge status: Home / Self Care | Source: Ambulatory Visit | Attending: Radiation Oncology | Admitting: Radiation Oncology

## 2024-11-03 VITALS — BP 137/78 | HR 86 | Temp 98.6°F | Resp 18 | Ht 68.21 in | Wt 148.7 lb

## 2024-11-03 DIAGNOSIS — F17210 Nicotine dependence, cigarettes, uncomplicated: Secondary | ICD-10-CM | POA: Diagnosis not present

## 2024-11-03 DIAGNOSIS — B1920 Unspecified viral hepatitis C without hepatic coma: Secondary | ICD-10-CM | POA: Diagnosis not present

## 2024-11-03 DIAGNOSIS — J449 Chronic obstructive pulmonary disease, unspecified: Secondary | ICD-10-CM | POA: Insufficient documentation

## 2024-11-03 DIAGNOSIS — N400 Enlarged prostate without lower urinary tract symptoms: Secondary | ICD-10-CM | POA: Diagnosis not present

## 2024-11-03 DIAGNOSIS — Z9221 Personal history of antineoplastic chemotherapy: Secondary | ICD-10-CM | POA: Insufficient documentation

## 2024-11-03 DIAGNOSIS — R918 Other nonspecific abnormal finding of lung field: Secondary | ICD-10-CM

## 2024-11-03 DIAGNOSIS — Z79624 Long term (current) use of inhibitors of nucleotide synthesis: Secondary | ICD-10-CM | POA: Insufficient documentation

## 2024-11-03 DIAGNOSIS — Z7961 Long term (current) use of immunomodulator: Secondary | ICD-10-CM | POA: Diagnosis not present

## 2024-11-03 DIAGNOSIS — C9001 Multiple myeloma in remission: Secondary | ICD-10-CM | POA: Insufficient documentation

## 2024-11-03 DIAGNOSIS — I10 Essential (primary) hypertension: Secondary | ICD-10-CM | POA: Insufficient documentation

## 2024-11-03 DIAGNOSIS — Z800 Family history of malignant neoplasm of digestive organs: Secondary | ICD-10-CM | POA: Diagnosis not present

## 2024-11-03 DIAGNOSIS — C3431 Malignant neoplasm of lower lobe, right bronchus or lung: Secondary | ICD-10-CM | POA: Insufficient documentation

## 2024-11-03 DIAGNOSIS — D696 Thrombocytopenia, unspecified: Secondary | ICD-10-CM | POA: Insufficient documentation

## 2024-11-03 NOTE — Progress Notes (Signed)
 HEMATOLOGY ONCOLOGY PROGRESS NOTE  Date of service: 11/05/2024  Patient Care Team: Dia Vina HERO, NP-C as PCP - General (Nurse Practitioner) [name], MD as Consulting Physician (Hematology)  CHIEF COMPLAINT/PURPOSE OF CONSULTATION: Follow-up for continued evaluation and management of lung adenocarcinoma and multiple myeloma.   HISTORY OF PRESENTING ILLNESS:  (12/24/2017) Pt is being seen as outpatient today for hospital fu of his plasma cell leukemia. His labs today 12/14/2017 show hgb 8.3, platelets 524k. He is doing well overall. He has staples in his right upper leg following a recent surgery and has not been in touch with the surgeons for a f/u of this. He is not receiving physical therapy and is not getting around at home.  He notes that he was not scheduled for a post hospitalization nephrology f/u and has no PCP.    On review of systems, pt reports intermittent fatigue, SOB, pain to the left leg, dizziness, dry mouth and denies fever, chills, night sweats, dysuria and any other accompanying symptoms.     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   Plasma cell leukemia (HCC)   11/30/2017 - 10/30/2018 Chemotherapy   The patient had dexamethasone  (DECADRON ) 4 MG tablet, 1 of 1 cycle, Start date: --, End date: -- bortezomib  SQ (VELCADE ) chemo injection 2.5 mg, 1.3 mg/m2 = 2.5 mg, Subcutaneous,  Once, 14 of 15 cycles Administration: 2.25 mg (12/01/2017), 2.25 mg (12/04/2017), 2.25 mg (12/20/2017), 2.25 mg (12/24/2017), 2.25 mg (12/28/2017), 2.25 mg (12/31/2017), 2.25 mg (01/11/2018), 2.25 mg (01/14/2018), 2.25 mg (01/18/2018), 2.25 mg (01/21/2018), 2.25 mg (02/01/2018), 2.25 mg (02/04/2018), 2.25 mg (02/08/2018), 2.25 mg (02/11/2018), 2.25 mg (02/22/2018), 2.25 mg (02/25/2018), 2.25 mg (03/01/2018), 2.25 mg (03/04/2018), 2.25 mg (03/15/2018), 2.25 mg (03/18/2018), 2.25 mg (03/22/2018), 2.25 mg (03/25/2018), 2.25 mg (04/05/2018), 2.25 mg  (04/08/2018), 2.25 mg (04/12/2018), 2.25 mg (04/15/2018), 2.25 mg (04/26/2018), 2.25 mg (04/29/2018), 2.25 mg (05/03/2018), 2.25 mg (05/07/2018), 2.25 mg (05/17/2018), 2.25 mg (05/20/2018), 2.25 mg (05/24/2018), 2.25 mg (05/27/2018), 2.25 mg (06/07/2018), 2.25 mg (06/10/2018), 2.25 mg (06/14/2018), 2.25 mg (06/17/2018), 2.25 mg (06/28/2018), 2.25 mg (07/02/2018), 2.25 mg (07/05/2018), 2.25 mg (07/08/2018), 2.25 mg (07/19/2018), 2.25 mg (07/22/2018), 2.25 mg (07/26/2018), 2.25 mg (07/29/2018), 2.25 mg (08/09/2018), 2.25 mg (08/13/2018), 2.25 mg (08/16/2018), 2.25 mg (08/19/2018), 2.5 mg (10/30/2018)  for chemotherapy treatment.    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)   08/13/2019 - 08/18/2022 Chemotherapy   Patient is on Treatment Plan : MYELOMA RELAPSED/REFRACTORY Carfilzomib  + Dexamethasone  (Kd) weekly q28d     09/15/2022 - 04/11/2024 Chemotherapy   Patient is on Treatment Plan : MYELOMA RELAPSED/REFRACTORY Carfilzomib  (20/70) D1,8,15 + Dexamethasone  weekly (40) (Kd) q28d  x 9 cycles / Dexamethasone  D1,8,15     Primary lung adenocarcinoma (HCC)  06/26/2024 Initial Diagnosis   Primary lung adenocarcinoma (HCC)   07/04/2024 -  Chemotherapy   Patient is on Treatment Plan : LUNG Carboplatin  (5) + Pemetrexed  (500) + Pembrolizumab  (200) D1 q21d Induction x  4 cycles / Maintenance Pemetrexed  (500) + Pembrolizumab  (200) D1 q21d      INTERVAL HISTORY:  Andres Fernandez is a 68 y.o. male who is here  today for continued evaluation and management of lung adenocarcinoma and multiple myeloma.   he was last seen by me on 09/26/2024; at the time he mentioned experiencing improvement in his right hip pain after an intra-articular injection and had discussed the possibility of a hip replacement with Ortho.  Today, he mentions his discussion with Dr. Kerrin regarding the possibility of a lobectomy vs radiation for treatment. We extensively dicussed his radiation treatment regimen and what will follow once he completes this. He did recently see Dr. Jomarie as well to discuss radiation treatment. Denies any new lumps/bumps.  He notes that his weight has been improving, and he's hoping return to weighing around 155 LBS - today he weighs 142 LBS.   He reports being UTD with his age-recommended vaccinations.   REVIEW OF SYSTEMS:   10 Point review of systems of done and is negative except as noted above.  MEDICAL HISTORY Past Medical History:  Diagnosis Date   AKI (acute kidney injury)    ARF (acute renal failure)    BPH (benign prostatic hyperplasia)    Cancer (HCC)    COPD (chronic obstructive pulmonary disease) (HCC)    Hepatitis C 11/27/2017   Hypertension    Multiple myeloma (HCC)    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   SIRS (systemic inflammatory response syndrome) (HCC)    Thrombocytopenia    Tobacco abuse     SURGICAL HISTORY Past Surgical History:  Procedure Laterality Date   APPENDECTOMY     BRONCHOSCOPY, WITH BIOPSY USING ELECTROMAGNETIC NAVIGATION Bilateral 04/29/2024   Procedure: ROBOTIC ASSISTED NAVIGATIONAL BRONCHOSCOPY;  Surgeon: Isadora Hose, MD;  Location: [facility];  Service: Pulmonary;  Laterality: Bilateral;   ENDOBRONCHIAL ULTRASOUND Bilateral 04/29/2024   Procedure: ENDOBRONCHIAL ULTRASOUND (EBUS);  Surgeon: Isadora Hose, MD;  Location: [facility];  Service: Pulmonary;  Laterality: Bilateral;   FEMUR IM NAIL Right 11/20/2017   Procedure: RIGHT FEMORAL  INTRAMEDULLARY (IM) NAIL;  Surgeon: Sharl Selinda Dover, MD;  Location: [facility];  Service: Orthopedics;  Laterality: Right;   IR IMAGING GUIDED PORT INSERTION  08/19/2021   PENILE PROSTHESIS IMPLANT      SOCIAL HISTORY Social History   Tobacco Use   Smoking status: Some Days    Current packs/day: 0.25    Average packs/day: 0.3 packs/day for 0.6 years (0.1 ttl pk-yrs)    Types: Cigarettes    Start date: 04/16/2024    Last attempt to quit: 11/09/2017   Smokeless tobacco: Never   Tobacco comments:    Smokes a cigarette every once in a while starting about 2 weeks ago - 04/23/24  Vaping Use   Vaping status: Never Used  Substance Use Topics   Alcohol use: Not Currently   Drug use: Not Currently    Social History   Social History Narrative   Not on file    SOCIAL DRIVERS OF HEALTH SDOH Screenings   Food Insecurity: No Food Insecurity (11/03/2024)  Housing: Low Risk  (11/03/2024)  Transportation Needs: No Transportation Needs (11/03/2024)  Utilities: Not At Risk (11/03/2024)  Alcohol Screen: Low Risk  (11/03/2024)  Depression (PHQ2-9): Low Risk  (11/03/2024)  Social Connections: Moderately Integrated (04/17/2024)  Stress: No Stress Concern Present (11/03/2024)  Tobacco Use: High Risk (10/03/2024)     FAMILY HISTORY Family History  Problem Relation Age of Onset  COPD Mother    Liver cancer Mother      ALLERGIES: has no known allergies.  MEDICATIONS  Current Outpatient Medications  Medication Sig Dispense Refill   acyclovir  (ZOVIRAX ) 400 MG tablet Take 1 tablet (400 mg total) by mouth daily. 90 tablet 6   albuterol  (VENTOLIN  HFA) 108 (90 Base) MCG/ACT inhaler Inhale 2 puffs into the lungs every 6 (six) hours as needed for shortness of breath.     apixaban  (ELIQUIS ) 5 MG TABS tablet Take 1 tablet (5 mg total) by mouth 2 (two) times daily. 60 tablet 5   ferrous sulfate  325 (65 FE) MG EC tablet TAKE 1 TABLET BY MOUTH EVERY DAY WITH BREAKFAST (Patient not taking:  Reported on 11/03/2024) 90 tablet 1   Fluticasone Propionate , Inhal, (FLUTICASONE PROPIONATE  DISKUS) 250 MCG/ACT AEPB TAKE 1 PUFF BY MOUTH EVERY DAY 60 each 1   lenalidomide  (REVLIMID ) 10 MG capsule Take 1 capsule (10 mg total) by mouth daily. Take 1 capsule (10 mg total) by mouth daily for 21 days. Take 7 days off. Repeat. 21 capsule 0   lidocaine -prilocaine  (EMLA ) cream Apply 1 Application topically as needed. Apply to Port-A-Cath at least 1 hr prior to treatment. 30 g 0   Multiple Vitamins-Minerals (MULTIVITAMIN WITH MINERALS) tablet Take 1 tablet by mouth at bedtime.     naproxen  (NAPROSYN ) 375 MG tablet Take 1 tablet (375 mg total) by mouth 2 (two) times daily. 20 tablet 0   ondansetron  (ZOFRAN ) 8 MG tablet Take 1 tablet (8 mg total) by mouth every 8 (eight) hours as needed for nausea or vomiting. 20 tablet 0   prochlorperazine  (COMPAZINE ) 10 MG tablet Take 1 tablet (10 mg total) by mouth every 6 (six) hours as needed for nausea or vomiting. 30 tablet 0   Spacer/Aero-Holding Chambers (AEROCHAMBER MV) inhaler Use as instructed 1 each 0   No current facility-administered medications for this visit.   Facility-Administered Medications Ordered in Other Visits  Medication Dose Route Frequency Provider Last Rate Last Admin   acetaminophen  (TYLENOL ) 325 MG tablet            famotidine  (PEPCID ) 20 MG tablet             PHYSICAL EXAMINATION: ECOG PERFORMANCE STATUS: 1 - Symptomatic but completely ambulatory VITALS: Vitals:   11/05/24 0901  BP: 130/84  Pulse: 92  Resp: 20  Temp: 97.9 F (36.6 C)  SpO2: 100%   Filed Weights   11/05/24 0901  Weight: 142 lb 8 oz (64.6 kg)   Body mass index is 21.53 kg/m.  GENERAL: alert, in no acute distress and comfortable SKIN: no acute rashes, no significant lesions EYES: conjunctiva are pink and non-injected, sclera anicteric OROPHARYNX: MMM, no exudates, no oropharyngeal erythema or ulceration NECK: supple, no JVD LYMPH:  no palpable  lymphadenopathy in the cervical, axillary or inguinal regions LUNGS: clear to auscultation b/l with normal respiratory effort HEART: regular rate & rhythm ABDOMEN:  normoactive bowel sounds , non tender, not distended, no hepatosplenomegaly Extremity: no pedal edema PSYCH: alert & oriented x 3 with fluent speech NEURO: no focal motor/sensory deficits  LABORATORY DATA:   I have reviewed the data as listed     Latest Ref Rng & Units 11/05/2024    7:58 AM 09/26/2024    8:59 AM 09/05/2024   11:13 AM  CBC EXTENDED  WBC 4.0 - 10.5 K/uL 8.6  17.1  17.0   RBC 4.22 - 5.81 MIL/uL 3.16  2.51  3.13   Hemoglobin 13.0 -  17.0 g/dL 89.5  8.0  9.5   HCT 60.9 - 52.0 % 31.5  23.6  27.9   Platelets 150 - 400 K/uL 217  109  179   NEUT# 1.7 - 7.7 K/uL 6.3  14.1  13.5   Lymph# 0.7 - 4.0 K/uL 1.5  1.5  1.6    TSH and T4:  Lab Results  Component Value Date   TSH 1.380 09/26/2024   T4TOTAL 7.7 09/26/2024      Latest Ref Rng & Units 11/05/2024    7:58 AM 09/26/2024    8:59 AM 09/05/2024   11:13 AM  CMP  Glucose 70 - 99 mg/dL 847  856  884   BUN 8 - 23 mg/dL 18  22  23    Creatinine 0.61 - 1.24 mg/dL 8.64  8.67  8.57   Sodium 135 - 145 mmol/L 141  141  138   Potassium 3.5 - 5.1 mmol/L 3.8  4.0  4.3   Chloride 98 - 111 mmol/L 106  107  106   CO2 22 - 32 mmol/L 22  24  22    Calcium 8.9 - 10.3 mg/dL 9.3  9.8  9.0   Total Protein 6.5 - 8.1 g/dL 7.5  7.4  7.4   Total Bilirubin 0.0 - 1.2 mg/dL 0.2  0.3  0.3   Alkaline Phos 38 - 126 U/L 83  107  124   AST 15 - 41 U/L 14  15  11    ALT 0 - 44 U/L 13  14  12     MULTIPLE MYELOMA PANEL 06/2023 - 09/2024 & KAPPA/LAMBDA LIGHT CHAINS 03/2024 - 09/2024     09/26/2024 GUARDANT 360:        Review Flowsheet         Latest Ref Rng & Units 05/15/2024  Spirometry  FVC-%Pred-Pre % 89   FVC-%Pred-Post % 85   FEV1-Pre L 1.87   FEV1-%Pred-Pre % 59   FEV1-Post L 1.93   FEV1-%Pred-Post % 61   DLCO unc ml/min/mmHg 8.52     Guardant 360 06/11/2024:                04/29/2024 Cytopathology report:   Fine needle Aspiration, RLL mass - Positive for malignancy, adenocarcinoma Bronchial Brushing, RLL - Positive for malignancy, adenocarcinoma Fine needle aspiration, lymph node station 11L negative for malignancy Fine needle aspiration lymph node 7, station 7, negative for malignancy   03/09/20 ([id number]) Bone Marrow Biopsy at [facility]    07/02/2019 Bone Marrow Biopsy ([id number]) at [facility]      08/07/18 BM Bx:             RADIOGRAPHIC STUDIES: I have personally reviewed the radiological images as listed and agreed with the findings in the report. NM PET Image Restag (PS) Skull Base To Thigh Result Date: 09/04/2024 CLINICAL DATA:  Subsequent treatment strategy for lung adenocarcinoma. EXAM: NUCLEAR MEDICINE PET SKULL BASE TO THIGH TECHNIQUE: 7.2 mCi F-18 FDG was injected intravenously. Full-ring PET imaging was performed from the skull base to thigh after the radiotracer. CT data was obtained and used for attenuation correction and anatomic localization. Fasting blood glucose: 109 mg/dl COMPARISON:  FDG PET scan 04/11/2024 FINDINGS: NECK: No hypermetabolic lymph nodes in the neck. Incidental CT findings: None. CHEST: Interval improvement of hypermetabolic nodules the RIGHT lung base. Dominant nodule measures 2.5 cm compared to 2.8 cm with SUV max equal 8.4 compared SUV max equal 11.8. Smaller adjacent nodule less well-defined measuring 9 mm  on image 87 compared to 12 mm with SUV max equal 2.2 compared SUV max 5.7. The Dominant nodule remains intensely hypermetabolic Centrilobular emphysema the upper lobes. No additional suspicious nodules. Small peripheral nodule in the RIGHT upper lobe measuring 6 mm is unchanged does not have metabolic activity (image 72). No hypermetabolic mediastinal lymph nodes Incidental CT findings: None. ABDOMEN/PELVIS: No abnormal hypermetabolic activity within the liver, pancreas, adrenal  glands, or spleen. No hypermetabolic lymph nodes in the abdomen or pelvis. Incidental CT findings: Atherosclerotic calcification of the aorta. Penile prosthetic noted SKELETON: No focal hypermetabolic activity to suggest skeletal metastasis. Incidental CT findings: From fixation the proximal RIGHT femur IMPRESSION: 1. Interval improvement of hypermetabolic nodules in the RIGHT lower lobe with decrease in size and metabolic activity. The dominant nodule remains intense hypermetabolic. 2. No evidence of metastatic adenopathy in the chest. 3. No evidence of distant metastatic disease. 4. Aortic Atherosclerosis (ICD10-I70.0) and Emphysema (ICD10-J43.9). Electronically Signed   By: Jackquline Boxer M.D.   On: 09/04/2024 08:40   XR Lumbar Spine 2-3 Views Result Date: 08/19/2024 2 views of his lumbar spine overall slight listhesis and some degenerative changes specially at L4-5 no acute fractures or osseous lesions are appreciated  XR Pelvis 1-2 Views Result Date: 08/19/2024 AP pelvis demonstrates no acute fractures no appreciable osseous lesions.  Hardware intact and in place from previous surgery    ASSESSMENT & PLAN:  68 y.o. 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    Initial BM Bx showed >95% involvement with kappa restricted serum free light chains. 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 MoL Cy-    08/07/18 BM Bx revealed normocellular bone marrow with trilineage hematopoiesis and 1% plasma cells which indicates the pt is in remission 08/06/18 PET/CT revealed  Expansile lytic lesion involving the left iliac bone, unchanged. Lytic lesion involving the sternum, unchanged. While both are suspicious for myeloma,  neither lesion is FDG avid. No hypermetabolic osseous lesions in the visualized axial and appendicular skeleton. Please note that the bilateral lower extremities were not imaged. No suspicious lymphadenopathy. Spleen is normal in size.     Pt began Ninlaro  3mg  maintenance after pt finished 13 cycles of CyBorD and BM bx showed only 1% plasma cells, then resumed CyBorD and held Ninlaro  in anticipation of his tandem bone marrow transplants beginning in late December 2019 with Dr. Rogue Stallion at [facility]    11/19/18 High dose chemotherapy with Melphalan 140mg /m2 and autologous stem cell rescue   03/20/19 High dose chemotherapy with Melphalan 200mg /m2 and autologous stem cell rescue. Non-infectious diarrhea, uncomplicated course otherwise.   07/02/2019 PET scan with results revealing 1. No FDG avid bone lesions are identified. 2.  Asymmetric uptake within the right, greater than left, palatine tonsils with mild fullness of the right tonsillar soft tissue. ENT consult vs attention on follow up suggested. 3.  Innumerable mixed sclerotic and lytic lesions throughout the axial and appendicular skeleton without associated hypermetabolic activity. 4.  Ancillary CT findings as above.   07/02/2019 bone marrow biopsy ([id number])with results showing: Trilineage hematopoieses with maturation. No plasma cell myeloma identified. Mild anemia and No Rouleaux or circulating plasma cells identified in the peripheral blood.   2) s/p Prophylactic IM nailing for rt femur  completed  Physical Therapy at the [facility].  -patient previously reported improvement and is currently progressively back to full time work.   3) Thrombocytopenia resolved    4) Renal insufficiency --likely related to multiple myeloma.  Remained fairly stable with baseline creatinine of 1.5-2.1.   Plan -maintain follow up with nephrology referral to Dr Rayburn [facility] for continued treatment -Pt completed 8 weeks of maverick for  hep c with Dr Efrain on 07/08/18.  -Kidney numbers improved. Creatinine in the 1.7-2.2 range    5) h/o tobacco abuse-counseled on smoking cessation -Currently not smoking.   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   PLAN: - Discussed lab results on 11/05/2024 in detail with patient: CBC showed WBC of 8.6K decreased from 17.1K, Hemoglobin of 10.4 increased from 8.0, and PLTs of 217K decreased from 109K. CMP with Creatinine 1.35 increased from 1.32.  - Discussed the risks vs benefits of lobectomy in regards to his upper lobe emphysema.He followed with Dr Kerrin who did not recommend surgery. -patient is f/u with rad onc for consideration of consolidative RT.  - Informed him that he will be proceeding with radiation treatment without chemotherapy and begin immunotherapy + Carfilzomib  following radiation.  - Discussed the complexity of treatment in regards to his Multiple Myeloma in addition to Lung Cancer.  - Continue B-complex and Vitamin-D.  FOLLOW-UP in 2 months for labs and follow-up with Dr. Onesimo.  The total time spent in the appointment was 32 minutes* .  All of the patient's questions were answered and the patient knows to call the clinic with any problems, questions, or concerns.  Emaline Onesimo MD MS AAHIVMS [facility] Hematology/Oncology Physician [facility]  *Total Encounter Time as defined by the Centers for Medicare and Medicaid Services includes, in addition to the face-to-face time of a patient visit (documented in the note above) non-face-to-face time: obtaining and reviewing outside history, ordering and reviewing medications, tests or procedures, care coordination (communications with other health care professionals or caregivers) and documentation in the medical record.  I,Emily Lagle,acting as a neurosurgeon for Emaline Onesimo, MD.,have documented all relevant documentation on the  behalf of Emaline Onesimo, MD,as directed by  Emaline Onesimo, MD while in the presence of Emaline Onesimo, MD.  I have reviewed the above documentation for accuracy and completeness, and I agree with the above.  Emmilyn Crooke, MD

## 2024-11-03 NOTE — Progress Notes (Signed)
 Radiation Oncology         [phone] ________________________________  Name: Andres Fernandez        MRN: [id number]  Date of Service: 11/03/2024 DOB: 1956-10-04  RR:Tzody, Vina HERO, NP-C  Dewey Rush, MD     REFERRING PHYSICIAN: [name], MD   DIAGNOSIS: Adenocarcinoma of the right lower lobe   HISTORY OF PRESENT ILLNESS: Andres Fernandez is a 68 y.o. male seen today regarding his diagnosis of adenocarcinoma of the right lower lobe lung.  Earlier this year, he was diagnosed with stage IIIA lung adenocarcinoma, and initiated neoadjuvant immuno chemotherapy in the care of Dr. Onesimo.  PET/CT imaging obtained in late September revealed interval improvement of his right lung base mass, measuring 2.5 cm compared to 2.8 previously.  The maximum SUV had fallen from 11.8 to 8.4.  The patient was seen in late October by Dr. Dewey, and discussed thoracic radiation; however, the patient wished to obtain a surgical opinion first.  He was seen by Dr. Kerrin, who  did not feel that surgical resection was a viable option, given the patient's rate function.  As the patient lives in [place], he asked that his thoracic radiation be administered at our facility.    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   SIRS (systemic inflammatory response syndrome) (HCC)    Thrombocytopenia    Tobacco abuse        PAST SURGICAL HISTORY: Past Surgical History:  Procedure Laterality Date   APPENDECTOMY     BRONCHOSCOPY, WITH BIOPSY USING ELECTROMAGNETIC NAVIGATION Bilateral 04/29/2024   Procedure: ROBOTIC ASSISTED NAVIGATIONAL BRONCHOSCOPY;  Surgeon: Isadora Hose, MD;  Location: [facility];  Service: Pulmonary;  Laterality: Bilateral;   ENDOBRONCHIAL ULTRASOUND Bilateral 04/29/2024   Procedure: ENDOBRONCHIAL ULTRASOUND (EBUS);  Surgeon: Isadora Hose, MD;  Location: [facility];  Service: Pulmonary;  Laterality: Bilateral;   FEMUR IM NAIL Right 11/20/2017   Procedure: RIGHT FEMORAL INTRAMEDULLARY (IM) NAIL;  Surgeon: Sharl Selinda Dover, MD;  Location: [facility];  Service: Orthopedics;  Laterality: Right;   IR IMAGING GUIDED PORT INSERTION  08/19/2021   PENILE PROSTHESIS IMPLANT       FAMILY HISTORY:  Family History  Problem Relation Age of Onset   COPD Mother    Liver cancer Mother      SOCIAL HISTORY:  reports that he has been smoking cigarettes. He started smoking about 6 months ago. He has a 0.1 pack-year smoking history. He has never used smokeless tobacco. He reports that he does not currently use alcohol. He reports that he does not currently use drugs.   ALLERGIES: Patient has no known allergies.   MEDICATIONS:  Current Outpatient Medications  Medication Sig Dispense Refill   acyclovir  (ZOVIRAX ) 400 MG tablet Take 1 tablet (400 mg total) by mouth daily. 90 tablet 6   albuterol  (VENTOLIN  HFA) 108 (90 Base) MCG/ACT inhaler Inhale 2 puffs into the lungs every 6 (six) hours as needed for shortness of breath.     apixaban  (ELIQUIS ) 5 MG TABS tablet Take 1 tablet (5 mg total) by mouth 2 (two) times daily. 60  tablet 5   ferrous sulfate  325 (65 FE) MG EC tablet TAKE 1 TABLET BY MOUTH EVERY DAY WITH BREAKFAST (Patient not taking: Reported on 11/03/2024) 90 tablet 1   Fluticasone Propionate , Inhal, (FLUTICASONE PROPIONATE  DISKUS) 250 MCG/ACT AEPB TAKE 1 PUFF BY MOUTH EVERY DAY 60 each 1   lenalidomide  (REVLIMID ) 10 MG capsule Take 1 capsule (10 mg total) by mouth daily. Take 1 capsule (10 mg total) by mouth daily for 21 days. Take 7 days off. Repeat. 21 capsule 0   lidocaine -prilocaine  (EMLA ) cream Apply 1 Application topically as needed.  Apply to Port-A-Cath at least 1 hr prior to treatment. 30 g 0   Multiple Vitamins-Minerals (MULTIVITAMIN WITH MINERALS) tablet Take 1 tablet by mouth at bedtime.     naproxen  (NAPROSYN ) 375 MG tablet Take 1 tablet (375 mg total) by mouth 2 (two) times daily. 20 tablet 0   ondansetron  (ZOFRAN ) 8 MG tablet Take 1 tablet (8 mg total) by mouth every 8 (eight) hours as needed for nausea or vomiting. 20 tablet 0   prochlorperazine  (COMPAZINE ) 10 MG tablet Take 1 tablet (10 mg total) by mouth every 6 (six) hours as needed for nausea or vomiting. 30 tablet 0   Spacer/Aero-Holding Chambers (AEROCHAMBER MV) inhaler Use as instructed 1 each 0   No current facility-administered medications for this encounter.   Facility-Administered Medications Ordered in Other Encounters  Medication Dose Route Frequency Provider Last Rate Last Admin   acetaminophen  (TYLENOL ) 325 MG tablet            famotidine  (PEPCID ) 20 MG tablet              REVIEW OF SYSTEMS: On review of systems, the patient reports that he is doing well overall. he denies any chest pain, shortness of breath, cough, fevers, chills, night sweats, unintended weight changes.  He denies any bowel or bladder disturbances, and denies abdominal pain, nausea or vomiting.  He denies any new musculoskeletal or joint aches or pains. A complete review of systems is obtained and is otherwise negative.     PHYSICAL EXAM:  Wt Readings from Last 3 Encounters:  11/03/24 148 lb 11.2 oz (67.4 kg)  10/28/24 147 lb (66.7 kg)  10/03/24 140 lb 6.4 oz (63.7 kg)   Temp Readings from Last 3 Encounters:  11/03/24 98.6 F (37 C) (Oral)  10/03/24 98.6 F (37 C) (Oral)  09/26/24 97.8 F (36.6 C)   BP Readings from Last 3 Encounters:  11/03/24 137/78  10/28/24 (!) 145/89  10/03/24 (!) 142/88   Pulse Readings from Last 3 Encounters:  11/03/24 86  10/28/24 100  10/03/24 (!) 105   Pain Assessment Pain Score: 0-No pain/10  In general this is a well appearing  male in no acute distress.  He's alert and oriented x4 and appropriate throughout the examination. Cardiopulmonary assessment is negative for acute distress and he exhibits normal effort.  No cervical or supraclavicular lymphadenopathy is appreciated.  His extremities are without edema.    ECOG = 0  0 - Asymptomatic (Fully active, able to carry on all predisease activities without restriction)  1 - Symptomatic but completely ambulatory (Restricted in physically strenuous activity but ambulatory and able to carry out work of a light or sedentary nature. For example, light housework, office work)  2 - Symptomatic, <50% in bed during the day (Ambulatory and capable of all self care but unable to carry out any work activities. Up and about more than 50% of waking hours)  3 -  Symptomatic, >50% in bed, but not bedbound (Capable of only limited self-care, confined to bed or chair 50% or more of waking hours)  4 - Bedbound (Completely disabled. Cannot carry on any self-care. Totally confined to bed or chair)  5 - Death   Raylene MM, Creech RH, Tormey DC, et al. [phone]). Toxicity and response criteria of the [facility]. Am. DOROTHA Bridges. Oncol. 5 (6): 649-55    LABORATORY DATA:  Lab Results  Component Value Date   WBC 17.1 (H) 09/26/2024   HGB 8.0 (L) 09/26/2024   HCT 23.6 (L) 09/26/2024   MCV 94.0 09/26/2024   PLT 109 (L) 09/26/2024   Lab Results  Component Value Date   NA 141 09/26/2024   K 4.0 09/26/2024   CL 107 09/26/2024   CO2 24 09/26/2024   Lab Results  Component Value Date   ALT 14 09/26/2024   AST 15 09/26/2024   GGT 46 04/11/2018   ALKPHOS 107 09/26/2024   BILITOT 0.3 09/26/2024      RADIOGRAPHY: Please note that I have personally reviewed the patient's PET CT scan dated 09/04/2024, with the results documented above.     IMPRESSION/PLAN: 1.  The patient is a 68 year old male with adenocarcinoma of the right lower lobe, with a good response to  neoadjuvant immuno chemotherapy.  Today I reviewed with him the logistics of external beam thoracic radiation, as well as its potential side effects.  I explained that these may include, but not limited to, fatigue, and irritation of the esophagus.  We discussed potential long-term side effects of radiation, which may include, but are not limited to, chronic lung injury with decreased respiratory function.    I will be in touch with Dr. Onesimo to see if he plans to administer additional chemotherapy concurrent with radiation.  Of note, the patient is scheduled to see Dr. Onesimo this Wednesday.   In a visit lasting 60 minutes, greater than 50% of the time was spent face to face discussing the patient's condition, in preparation for the discussion, and coordinating the patient's care.    JOMARIE LYNWOOD LABOR., MD    **Disclaimer: This note was dictated with voice recognition software. Similar sounding words can inadvertently be transcribed and this note may contain transcription errors which may not have been corrected upon publication of note.**

## 2024-11-04 ENCOUNTER — Other Ambulatory Visit: Payer: Self-pay

## 2024-11-04 DIAGNOSIS — C9010 Plasma cell leukemia not having achieved remission: Secondary | ICD-10-CM

## 2024-11-04 DIAGNOSIS — C9000 Multiple myeloma not having achieved remission: Secondary | ICD-10-CM

## 2024-11-05 ENCOUNTER — Other Ambulatory Visit: Payer: Self-pay

## 2024-11-05 ENCOUNTER — Inpatient Hospital Stay: Admitting: Hematology

## 2024-11-05 ENCOUNTER — Inpatient Hospital Stay: Attending: Hematology

## 2024-11-05 VITALS — BP 130/84 | HR 92 | Temp 97.9°F | Resp 20 | Wt 142.5 lb

## 2024-11-05 DIAGNOSIS — C9001 Multiple myeloma in remission: Secondary | ICD-10-CM | POA: Insufficient documentation

## 2024-11-05 DIAGNOSIS — D630 Anemia in neoplastic disease: Secondary | ICD-10-CM | POA: Insufficient documentation

## 2024-11-05 DIAGNOSIS — N289 Disorder of kidney and ureter, unspecified: Secondary | ICD-10-CM | POA: Insufficient documentation

## 2024-11-05 DIAGNOSIS — Z87891 Personal history of nicotine dependence: Secondary | ICD-10-CM | POA: Diagnosis not present

## 2024-11-05 DIAGNOSIS — C9011 Plasma cell leukemia in remission: Secondary | ICD-10-CM | POA: Diagnosis not present

## 2024-11-05 DIAGNOSIS — R7989 Other specified abnormal findings of blood chemistry: Secondary | ICD-10-CM | POA: Diagnosis not present

## 2024-11-05 DIAGNOSIS — C3490 Malignant neoplasm of unspecified part of unspecified bronchus or lung: Secondary | ICD-10-CM | POA: Insufficient documentation

## 2024-11-05 DIAGNOSIS — C9000 Multiple myeloma not having achieved remission: Secondary | ICD-10-CM

## 2024-11-05 DIAGNOSIS — Z800 Family history of malignant neoplasm of digestive organs: Secondary | ICD-10-CM | POA: Diagnosis not present

## 2024-11-05 DIAGNOSIS — Z79899 Other long term (current) drug therapy: Secondary | ICD-10-CM | POA: Insufficient documentation

## 2024-11-05 DIAGNOSIS — C9010 Plasma cell leukemia not having achieved remission: Secondary | ICD-10-CM

## 2024-11-05 LAB — CBC WITH DIFFERENTIAL (CANCER CENTER ONLY)
Abs Immature Granulocytes: 0.03 K/uL (ref 0.00–0.07)
Basophils Absolute: 0.1 K/uL (ref 0.0–0.1)
Basophils Relative: 1 %
Eosinophils Absolute: 0.1 K/uL (ref 0.0–0.5)
Eosinophils Relative: 1 %
HCT: 31.5 % — ABNORMAL LOW (ref 39.0–52.0)
Hemoglobin: 10.4 g/dL — ABNORMAL LOW (ref 13.0–17.0)
Immature Granulocytes: 0 %
Lymphocytes Relative: 17 %
Lymphs Abs: 1.5 K/uL (ref 0.7–4.0)
MCH: 32.9 pg (ref 26.0–34.0)
MCHC: 33 g/dL (ref 30.0–36.0)
MCV: 99.7 fL (ref 80.0–100.0)
Monocytes Absolute: 0.7 K/uL (ref 0.1–1.0)
Monocytes Relative: 9 %
Neutro Abs: 6.3 K/uL (ref 1.7–7.7)
Neutrophils Relative %: 72 %
Platelet Count: 217 K/uL (ref 150–400)
RBC: 3.16 MIL/uL — ABNORMAL LOW (ref 4.22–5.81)
RDW: 14.9 % (ref 11.5–15.5)
WBC Count: 8.6 K/uL (ref 4.0–10.5)
nRBC: 0 % (ref 0.0–0.2)

## 2024-11-05 LAB — CMP (CANCER CENTER ONLY)
ALT: 13 U/L (ref 0–44)
AST: 14 U/L — ABNORMAL LOW (ref 15–41)
Albumin: 3.8 g/dL (ref 3.5–5.0)
Alkaline Phosphatase: 83 U/L (ref 38–126)
Anion gap: 13 (ref 5–15)
BUN: 18 mg/dL (ref 8–23)
CO2: 22 mmol/L (ref 22–32)
Calcium: 9.3 mg/dL (ref 8.9–10.3)
Chloride: 106 mmol/L (ref 98–111)
Creatinine: 1.35 mg/dL — ABNORMAL HIGH (ref 0.61–1.24)
GFR, Estimated: 58 mL/min — ABNORMAL LOW (ref >60)
Glucose, Bld: 152 mg/dL — ABNORMAL HIGH (ref 70–99)
Potassium: 3.8 mmol/L (ref 3.5–5.1)
Sodium: 141 mmol/L (ref 135–145)
Total Bilirubin: 0.2 mg/dL (ref 0.0–1.2)
Total Protein: 7.5 g/dL (ref 6.5–8.1)

## 2024-11-05 LAB — TSH: TSH: 1.93 u[IU]/mL (ref 0.350–4.500)

## 2024-11-06 LAB — KAPPA/LAMBDA LIGHT CHAINS
Kappa free light chain: 28.2 mg/L — ABNORMAL HIGH (ref 3.3–19.4)
Kappa, lambda light chain ratio: 1.1 (ref 0.26–1.65)
Lambda free light chains: 25.7 mg/L (ref 5.7–26.3)

## 2024-11-06 LAB — T4: T4, Total: 8.3 ug/dL (ref 4.5–12.0)

## 2024-11-09 LAB — MULTIPLE MYELOMA PANEL, SERUM
Albumin SerPl Elph-Mcnc: 3.1 g/dL (ref 2.9–4.4)
Albumin/Glob SerPl: 0.9 (ref 0.7–1.7)
Alpha 1: 0.5 g/dL — ABNORMAL HIGH (ref 0.0–0.4)
Alpha2 Glob SerPl Elph-Mcnc: 1.4 g/dL — ABNORMAL HIGH (ref 0.4–1.0)
B-Globulin SerPl Elph-Mcnc: 0.9 g/dL (ref 0.7–1.3)
Gamma Glob SerPl Elph-Mcnc: 0.9 g/dL (ref 0.4–1.8)
Globulin, Total: 3.7 g/dL (ref 2.2–3.9)
IgA: 102 mg/dL (ref 61–437)
IgG (Immunoglobin G), Serum: 938 mg/dL (ref 603–1613)
IgM (Immunoglobulin M), Srm: 8 mg/dL — ABNORMAL LOW (ref 20–172)
Total Protein ELP: 6.8 g/dL (ref 6.0–8.5)

## 2024-11-11 ENCOUNTER — Encounter: Payer: Self-pay | Admitting: Hematology

## 2024-11-11 ENCOUNTER — Ambulatory Visit
Admission: RE | Admit: 2024-11-11 | Discharge: 2024-11-11 | Disposition: A | Discharge status: Home / Self Care | Source: Ambulatory Visit | Attending: Radiation Oncology | Admitting: Radiation Oncology

## 2024-11-11 DIAGNOSIS — Z510 Encounter for antineoplastic radiation therapy: Secondary | ICD-10-CM | POA: Insufficient documentation

## 2024-11-11 DIAGNOSIS — C3431 Malignant neoplasm of lower lobe, right bronchus or lung: Secondary | ICD-10-CM | POA: Diagnosis present

## 2024-11-11 DIAGNOSIS — F17210 Nicotine dependence, cigarettes, uncomplicated: Secondary | ICD-10-CM | POA: Insufficient documentation

## 2024-11-13 ENCOUNTER — Other Ambulatory Visit: Payer: Self-pay

## 2024-11-13 DIAGNOSIS — C3431 Malignant neoplasm of lower lobe, right bronchus or lung: Secondary | ICD-10-CM | POA: Diagnosis not present

## 2024-11-13 LAB — RAD ONC ARIA SESSION SUMMARY
Course Elapsed Days: 0
Plan Fractions Treated to Date: 1
Plan Prescribed Dose Per Fraction: 2 Gy
Plan Total Fractions Prescribed: 33
Plan Total Prescribed Dose: 66 Gy
Reference Point Dosage Given to Date: 2 Gy
Reference Point Session Dosage Given: 2 Gy
Session Number: 1

## 2024-11-14 ENCOUNTER — Other Ambulatory Visit: Payer: Self-pay

## 2024-11-14 ENCOUNTER — Ambulatory Visit
Admission: RE | Admit: 2024-11-14 | Discharge: 2024-11-14 | Disposition: A | Source: Ambulatory Visit | Attending: Radiation Oncology

## 2024-11-14 DIAGNOSIS — C3431 Malignant neoplasm of lower lobe, right bronchus or lung: Secondary | ICD-10-CM | POA: Diagnosis not present

## 2024-11-14 LAB — RAD ONC ARIA SESSION SUMMARY
Course Elapsed Days: 1
Plan Fractions Treated to Date: 2
Plan Prescribed Dose Per Fraction: 2 Gy
Plan Total Fractions Prescribed: 33
Plan Total Prescribed Dose: 66 Gy
Reference Point Dosage Given to Date: 4 Gy
Reference Point Session Dosage Given: 2 Gy
Session Number: 2

## 2024-11-17 ENCOUNTER — Ambulatory Visit
Admission: RE | Admit: 2024-11-17 | Discharge: 2024-11-17 | Disposition: A | Source: Ambulatory Visit | Attending: Radiation Oncology | Admitting: Radiation Oncology

## 2024-11-17 ENCOUNTER — Other Ambulatory Visit: Payer: Self-pay

## 2024-11-17 ENCOUNTER — Ambulatory Visit
Admission: RE | Admit: 2024-11-17 | Discharge: 2024-11-17 | Disposition: A | Source: Ambulatory Visit | Attending: Radiation Oncology

## 2024-11-17 DIAGNOSIS — C3431 Malignant neoplasm of lower lobe, right bronchus or lung: Secondary | ICD-10-CM | POA: Diagnosis not present

## 2024-11-17 LAB — RAD ONC ARIA SESSION SUMMARY
Course Elapsed Days: 4
Plan Fractions Treated to Date: 3
Plan Prescribed Dose Per Fraction: 2 Gy
Plan Total Fractions Prescribed: 33
Plan Total Prescribed Dose: 66 Gy
Reference Point Dosage Given to Date: 6 Gy
Reference Point Session Dosage Given: 2 Gy
Session Number: 3

## 2024-11-18 ENCOUNTER — Other Ambulatory Visit: Payer: Self-pay

## 2024-11-18 ENCOUNTER — Ambulatory Visit
Admission: RE | Admit: 2024-11-18 | Discharge: 2024-11-18 | Disposition: A | Source: Ambulatory Visit | Attending: Radiation Oncology

## 2024-11-18 DIAGNOSIS — C3431 Malignant neoplasm of lower lobe, right bronchus or lung: Secondary | ICD-10-CM | POA: Diagnosis not present

## 2024-11-18 LAB — RAD ONC ARIA SESSION SUMMARY
Course Elapsed Days: 5
Plan Fractions Treated to Date: 4
Plan Prescribed Dose Per Fraction: 2 Gy
Plan Total Fractions Prescribed: 33
Plan Total Prescribed Dose: 66 Gy
Reference Point Dosage Given to Date: 8 Gy
Reference Point Session Dosage Given: 2 Gy
Session Number: 4

## 2024-11-19 ENCOUNTER — Other Ambulatory Visit: Payer: Self-pay

## 2024-11-19 ENCOUNTER — Inpatient Hospital Stay: Attending: Hematology | Admitting: Hematology and Oncology

## 2024-11-19 ENCOUNTER — Other Ambulatory Visit: Payer: Self-pay | Admitting: Hematology and Oncology

## 2024-11-19 ENCOUNTER — Ambulatory Visit
Admission: RE | Admit: 2024-11-19 | Discharge: 2024-11-19 | Disposition: A | Discharge status: Home / Self Care | Source: Ambulatory Visit | Attending: Radiation Oncology | Admitting: Radiation Oncology

## 2024-11-19 VITALS — BP 118/86 | HR 109 | Temp 98.3°F | Resp 18 | Ht 68.21 in

## 2024-11-19 DIAGNOSIS — B029 Zoster without complications: Secondary | ICD-10-CM | POA: Insufficient documentation

## 2024-11-19 DIAGNOSIS — Z9221 Personal history of antineoplastic chemotherapy: Secondary | ICD-10-CM | POA: Insufficient documentation

## 2024-11-19 DIAGNOSIS — C3431 Malignant neoplasm of lower lobe, right bronchus or lung: Secondary | ICD-10-CM | POA: Diagnosis not present

## 2024-11-19 DIAGNOSIS — C3490 Malignant neoplasm of unspecified part of unspecified bronchus or lung: Secondary | ICD-10-CM | POA: Insufficient documentation

## 2024-11-19 DIAGNOSIS — F17210 Nicotine dependence, cigarettes, uncomplicated: Secondary | ICD-10-CM | POA: Diagnosis not present

## 2024-11-19 DIAGNOSIS — C9010 Plasma cell leukemia not having achieved remission: Secondary | ICD-10-CM | POA: Insufficient documentation

## 2024-11-19 DIAGNOSIS — Z800 Family history of malignant neoplasm of digestive organs: Secondary | ICD-10-CM | POA: Diagnosis not present

## 2024-11-19 LAB — RAD ONC ARIA SESSION SUMMARY
Course Elapsed Days: 6
Plan Fractions Treated to Date: 5
Plan Prescribed Dose Per Fraction: 2 Gy
Plan Total Fractions Prescribed: 33
Plan Total Prescribed Dose: 66 Gy
Reference Point Dosage Given to Date: 10 Gy
Reference Point Session Dosage Given: 2 Gy
Session Number: 5

## 2024-11-19 MED ORDER — VALACYCLOVIR HCL 1 G PO TABS: 1000.0000 mg | ORAL_TABLET | Freq: Three times a day (TID) | ORAL | 0 refills | Status: AC

## 2024-11-19 NOTE — Progress Notes (Signed)
 [facility]  [address] [place],  [place]  [address] [phone]  Clinic Day:  11/19/2024  Referring physician: Dia Vina HERO, NP-C  ASSESSMENT & PLAN:   Assessment & Plan: Shingles: He presents today for radiation therapy and symptom management. Along the rib line on the right side, he has clusters of blisters most consistent with shingles. He notes mild to moderate pain with the areas that he is managing with Tylenol . I will send in Valcyclovir for him to take over the next week. We discussed for him to call with unmanaged pain and advised him to keep the area covered.   The patient understands the plans discussed today and is in agreement with them.  He knows to contact our office if he develops concerns prior to his next appointment.   I provided 20 minutes of face-to-face time during this encounter and > 50% was spent counseling as documented under my assessment and plan.    Andres DELENA Bach, NP  [facility] [address] [place] [address] Dept: [phone] Dept Fax: [phone]   No orders of the defined types were placed in this encounter.     CHIEF COMPLAINT:  CC: Painful blisters to right side  Current Treatment:  Radiation therapy  HISTORY OF PRESENT ILLNESS:   Oncology History  Plasma cell leukemia (HCC)  11/23/2017 Initial Diagnosis   Plasma cell leukemia (HCC)   11/30/2017 - 10/30/2018 Chemotherapy   The patient had dexamethasone  (DECADRON ) 4 MG tablet, 1 of 1 cycle, Start date: --, End date: -- bortezomib  SQ (VELCADE ) chemo injection 2.5 mg, 1.3 mg/m2 = 2.5 mg, Subcutaneous,  Once, 14 of 15 cycles Administration: 2.25 mg (12/01/2017), 2.25 mg (12/04/2017), 2.25 mg (12/20/2017), 2.25 mg (12/24/2017), 2.25 mg (12/28/2017), 2.25 mg (12/31/2017), 2.25 mg (01/11/2018), 2.25 mg (01/14/2018), 2.25 mg (01/18/2018), 2.25 mg (01/21/2018), 2.25 mg (02/01/2018), 2.25 mg  (02/04/2018), 2.25 mg (02/08/2018), 2.25 mg (02/11/2018), 2.25 mg (02/22/2018), 2.25 mg (02/25/2018), 2.25 mg (03/01/2018), 2.25 mg (03/04/2018), 2.25 mg (03/15/2018), 2.25 mg (03/18/2018), 2.25 mg (03/22/2018), 2.25 mg (03/25/2018), 2.25 mg (04/05/2018), 2.25 mg (04/08/2018), 2.25 mg (04/12/2018), 2.25 mg (04/15/2018), 2.25 mg (04/26/2018), 2.25 mg (04/29/2018), 2.25 mg (05/03/2018), 2.25 mg (05/07/2018), 2.25 mg (05/17/2018), 2.25 mg (05/20/2018), 2.25 mg (05/24/2018), 2.25 mg (05/27/2018), 2.25 mg (06/07/2018), 2.25 mg (06/10/2018), 2.25 mg (06/14/2018), 2.25 mg (06/17/2018), 2.25 mg (06/28/2018), 2.25 mg (07/02/2018), 2.25 mg (07/05/2018), 2.25 mg (07/08/2018), 2.25 mg (07/19/2018), 2.25 mg (07/22/2018), 2.25 mg (07/26/2018), 2.25 mg (07/29/2018), 2.25 mg (08/09/2018), 2.25 mg (08/13/2018), 2.25 mg (08/16/2018), 2.25 mg (08/19/2018), 2.5 mg (10/30/2018)  for chemotherapy treatment.    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)   08/13/2019 - 08/18/2022 Chemotherapy   Patient is on Treatment Plan : MYELOMA RELAPSED/REFRACTORY Carfilzomib  + Dexamethasone  (Kd) weekly q28d     09/15/2022 - 04/11/2024 Chemotherapy   Patient is on Treatment Plan : MYELOMA RELAPSED/REFRACTORY Carfilzomib  (20/70) D1,8,15 +  Dexamethasone  weekly (40) (Kd) q28d  x 9 cycles / Dexamethasone  D1,8,15     Primary lung adenocarcinoma (HCC)  06/26/2024 Initial Diagnosis   Primary lung adenocarcinoma (HCC)   07/04/2024 -  Chemotherapy   Patient is on  Treatment Plan : LUNG Carboplatin  (5) + Pemetrexed  (500) + Pembrolizumab  (200) D1 q21d Induction x 4 cycles / Maintenance Pemetrexed  (500) + Pembrolizumab  (200) D1 q21d         INTERVAL HISTORY:   Andres Fernandez is here today for repeat clinical assessment. He denies fevers or chills. He denies pain. His appetite is good. His weight has been stable.  REVIEW OF SYSTEMS:   Review of Systems  Constitutional: Negative.   HENT:  Negative.    Eyes: Negative.   Respiratory: Negative.    Cardiovascular: Negative.   Gastrointestinal: Negative.   Endocrine: Negative.   Genitourinary: Negative.    Musculoskeletal: Negative.   Skin:  Positive for rash.  Neurological: Negative.   Hematological: Negative.   Psychiatric/Behavioral: Negative.       VITALS:   There were no vitals taken for this visit.  Wt Readings from Last 3 Encounters:  11/05/24 142 lb 8 oz (64.6 kg)  11/03/24 148 lb 11.2 oz (67.4 kg)  10/28/24 147 lb (66.7 kg)    There is no height or weight on file to calculate BMI.  Performance status (ECOG): 1 - Symptomatic but completely ambulatory    PHYSICAL EXAM:   Physical Exam Constitutional:      Appearance: Normal appearance. He is normal weight.  HENT:     Head: Normocephalic and atraumatic.     Nose: Nose normal.     Mouth/Throat:     Mouth: Mucous membranes are moist.  Cardiovascular:     Rate and Rhythm: Normal rate and regular rhythm.     Pulses: Normal pulses.     Heart sounds: Normal heart sounds.  Pulmonary:     Effort: Pulmonary effort is normal.     Breath sounds: Normal breath sounds.  Abdominal:     General: Abdomen is flat.     Palpations: Abdomen is soft.  Musculoskeletal:        General: Normal range of motion.     Cervical back: Normal range of motion.  Skin:    Comments: Multiple clusters of blisters noted to the right side and along the right side of the spine.   Neurological:     General: No focal deficit present.     Mental Status: He is  alert and oriented to person, place, and time. Mental status is at baseline.  Psychiatric:        Mood and Affect: Mood normal.        Behavior: Behavior normal.        Thought Content: Thought content normal.        Judgment: Judgment normal.    LABS:      Latest Ref Rng & Units 11/05/2024    7:58 AM 09/26/2024    8:59 AM 09/05/2024   11:13 AM  CBC  WBC 4.0 - 10.5 K/uL 8.6  17.1  17.0   Hemoglobin 13.0 - 17.0 g/dL 89.5  8.0  9.5   Hematocrit 39.0 - 52.0 % 31.5  23.6  27.9   Platelets 150 - 400 K/uL 217  109  179       Latest Ref Rng & Units 11/05/2024    7:58 AM 09/26/2024    8:59 AM 09/05/2024   11:13 AM  CMP  Glucose 70 - 99 mg/dL 847  856  884   BUN 8 - 23 mg/dL 18  22  23    Creatinine 0.61 - 1.24 mg/dL 8.64  8.67  8.57   Sodium 135 - 145 mmol/L 141  141  138   Potassium 3.5 - 5.1 mmol/L 3.8  4.0  4.3   Chloride 98 - 111 mmol/L 106  107  106   CO2 22 - 32 mmol/L 22  24  22    Calcium 8.9 - 10.3 mg/dL 9.3  9.8  9.0   Total Protein 6.5 - 8.1 g/dL 7.5  7.4  7.4   Total Bilirubin 0.0 - 1.2 mg/dL 0.2  0.3  0.3   Alkaline Phos 38 - 126 U/L 83  107  124   AST 15 - 41 U/L 14  15  11    ALT 0 - 44 U/L 13  14  12       No results found for: CEA1, CEA / No results found for: CEA1, CEA No components found for: PROSTATESPECIFICANTIGEN PSA1 No results found for: CAN199 No results found for: RJW874  Lab Results  Component Value Date   TOTALPROTELP 6.8 11/05/2024   ALBUMINELP 3.7 11/17/2017   A1GS 0.3 11/17/2017   A2GS 1.3 (H) 11/17/2017   BETS 1.1 11/17/2017   GAMS 0.9 11/17/2017   MSPIKE Not Observed 11/17/2017   SPEI Comment 11/17/2017   No results found for: TIBC, FERRITIN, IRONPCTSAT Lab Results  Component Value Date   LDH 123 02/11/2019   LDH 236 12/14/2017   LDH 240 (H) 11/23/2017    STUDIES:   No results found.    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   SIRS (systemic inflammatory response syndrome) (HCC)    Thrombocytopenia    Tobacco abuse     Past Surgical History:  Procedure Laterality Date   APPENDECTOMY     BRONCHOSCOPY, WITH BIOPSY USING ELECTROMAGNETIC NAVIGATION Bilateral 04/29/2024   Procedure: ROBOTIC ASSISTED NAVIGATIONAL BRONCHOSCOPY;  Surgeon: Isadora Hose, MD;  Location: [facility];  Service: Pulmonary;  Laterality: Bilateral;   ENDOBRONCHIAL ULTRASOUND Bilateral 04/29/2024   Procedure: ENDOBRONCHIAL ULTRASOUND (EBUS);  Surgeon: Isadora Hose, MD;  Location: [facility];  Service: Pulmonary;  Laterality: Bilateral;   FEMUR IM NAIL Right 11/20/2017   Procedure: RIGHT FEMORAL INTRAMEDULLARY (IM) NAIL;  Surgeon: Sharl Selinda Dover, MD;  Location: [facility];  Service: Orthopedics;  Laterality: Right;   IR IMAGING GUIDED PORT INSERTION  08/19/2021   PENILE PROSTHESIS IMPLANT      Family History  Problem Relation Age of Onset   COPD Mother    Liver cancer Mother     Social History:  reports that he has been smoking cigarettes. He started smoking about 7 months ago. He has a 0.1 pack-year smoking history. He has never used smokeless tobacco. He reports that he does not currently use alcohol. He reports that he does not currently use drugs.The patient is alone  today.  Allergies: No Known Allergies  Current Medications: Current Outpatient Medications  Medication Sig Dispense Refill   valACYclovir (VALTREX) 1000 MG tablet Take 1 tablet (1,000 mg total) by mouth 3 (three)  times daily. 21 tablet 0   acyclovir  (ZOVIRAX ) 400 MG tablet Take 1 tablet (400 mg total) by mouth daily. 90 tablet 6   albuterol  (VENTOLIN  HFA) 108 (90 Base) MCG/ACT inhaler Inhale 2 puffs into the lungs every 6 (six) hours as needed for shortness  of breath.     apixaban  (ELIQUIS ) 5 MG TABS tablet Take 1 tablet (5 mg total) by mouth 2 (two) times daily. 60 tablet 5   ferrous sulfate  325 (65 FE) MG EC tablet TAKE 1 TABLET BY MOUTH EVERY DAY WITH BREAKFAST (Patient not taking: Reported on 11/03/2024) 90 tablet 1   Fluticasone Propionate , Inhal, (FLUTICASONE PROPIONATE  DISKUS) 250 MCG/ACT AEPB TAKE 1 PUFF BY MOUTH EVERY DAY 60 each 1   lenalidomide  (REVLIMID ) 10 MG capsule Take 1 capsule (10 mg total) by mouth daily. Take 1 capsule (10 mg total) by mouth daily for 21 days. Take 7 days off. Repeat. 21 capsule 0   lidocaine -prilocaine  (EMLA ) cream Apply 1 Application topically as needed. Apply to Port-A-Cath at least 1 hr prior to treatment. 30 g 0   Multiple Vitamins-Minerals (MULTIVITAMIN WITH MINERALS) tablet Take 1 tablet by mouth at bedtime.     naproxen  (NAPROSYN ) 375 MG tablet Take 1 tablet (375 mg total) by mouth 2 (two) times daily. 20 tablet 0   ondansetron  (ZOFRAN ) 8 MG tablet Take 1 tablet (8 mg total) by mouth every 8 (eight) hours as needed for nausea or vomiting. 20 tablet 0   prochlorperazine  (COMPAZINE ) 10 MG tablet Take 1 tablet (10 mg total) by mouth every 6 (six) hours as needed for nausea or vomiting. 30 tablet 0   Spacer/Aero-Holding Chambers (AEROCHAMBER MV) inhaler Use as instructed 1 each 0   No current facility-administered medications for this visit.   Facility-Administered Medications Ordered in Other Visits  Medication Dose Route Frequency Provider Last Rate Last Admin   acetaminophen  (TYLENOL ) 325 MG tablet            famotidine  (PEPCID ) 20 MG tablet

## 2024-11-20 ENCOUNTER — Inpatient Hospital Stay

## 2024-11-20 ENCOUNTER — Ambulatory Visit
Admission: RE | Admit: 2024-11-20 | Discharge: 2024-11-20 | Disposition: A | Discharge status: Home / Self Care | Source: Ambulatory Visit | Attending: Radiation Oncology | Admitting: Radiation Oncology

## 2024-11-20 ENCOUNTER — Other Ambulatory Visit: Payer: Self-pay

## 2024-11-20 DIAGNOSIS — C3431 Malignant neoplasm of lower lobe, right bronchus or lung: Secondary | ICD-10-CM | POA: Diagnosis not present

## 2024-11-20 DIAGNOSIS — C3490 Malignant neoplasm of unspecified part of unspecified bronchus or lung: Secondary | ICD-10-CM

## 2024-11-20 LAB — RAD ONC ARIA SESSION SUMMARY
Course Elapsed Days: 7
Plan Fractions Treated to Date: 6
Plan Prescribed Dose Per Fraction: 2 Gy
Plan Total Fractions Prescribed: 33
Plan Total Prescribed Dose: 66 Gy
Reference Point Dosage Given to Date: 12 Gy
Reference Point Session Dosage Given: 2 Gy
Session Number: 6

## 2024-11-21 ENCOUNTER — Other Ambulatory Visit: Payer: Self-pay

## 2024-11-21 ENCOUNTER — Ambulatory Visit
Admission: RE | Admit: 2024-11-21 | Discharge: 2024-11-21 | Disposition: A | Source: Ambulatory Visit | Attending: Radiation Oncology

## 2024-11-21 DIAGNOSIS — C3431 Malignant neoplasm of lower lobe, right bronchus or lung: Secondary | ICD-10-CM | POA: Diagnosis not present

## 2024-11-21 LAB — RAD ONC ARIA SESSION SUMMARY
Course Elapsed Days: 8
Plan Fractions Treated to Date: 7
Plan Prescribed Dose Per Fraction: 2 Gy
Plan Total Fractions Prescribed: 33
Plan Total Prescribed Dose: 66 Gy
Reference Point Dosage Given to Date: 14 Gy
Reference Point Session Dosage Given: 2 Gy
Session Number: 7

## 2024-11-24 ENCOUNTER — Ambulatory Visit
Admission: RE | Admit: 2024-11-24 | Discharge: 2024-11-24 | Disposition: A | Source: Ambulatory Visit | Attending: Radiation Oncology

## 2024-11-24 ENCOUNTER — Other Ambulatory Visit: Payer: Self-pay

## 2024-11-24 DIAGNOSIS — C3431 Malignant neoplasm of lower lobe, right bronchus or lung: Secondary | ICD-10-CM | POA: Diagnosis not present

## 2024-11-24 LAB — RAD ONC ARIA SESSION SUMMARY
Course Elapsed Days: 11
Plan Fractions Treated to Date: 8
Plan Prescribed Dose Per Fraction: 2 Gy
Plan Total Fractions Prescribed: 33
Plan Total Prescribed Dose: 66 Gy
Reference Point Dosage Given to Date: 16 Gy
Reference Point Session Dosage Given: 2 Gy
Session Number: 8

## 2024-11-25 ENCOUNTER — Ambulatory Visit
Admission: RE | Admit: 2024-11-25 | Discharge: 2024-11-25 | Disposition: A | Source: Ambulatory Visit | Attending: Radiation Oncology

## 2024-11-25 ENCOUNTER — Other Ambulatory Visit: Payer: Self-pay

## 2024-11-25 ENCOUNTER — Other Ambulatory Visit: Payer: Self-pay | Admitting: Radiation Oncology

## 2024-11-25 ENCOUNTER — Ambulatory Visit
Admission: RE | Admit: 2024-11-25 | Discharge: 2024-11-25 | Attending: Radiation Oncology | Admitting: Radiation Oncology

## 2024-11-25 DIAGNOSIS — C3431 Malignant neoplasm of lower lobe, right bronchus or lung: Secondary | ICD-10-CM | POA: Diagnosis not present

## 2024-11-25 LAB — RAD ONC ARIA SESSION SUMMARY
Course Elapsed Days: 12
Plan Fractions Treated to Date: 9
Plan Prescribed Dose Per Fraction: 2 Gy
Plan Total Fractions Prescribed: 33
Plan Total Prescribed Dose: 66 Gy
Reference Point Dosage Given to Date: 18 Gy
Reference Point Session Dosage Given: 2 Gy
Session Number: 9

## 2024-11-25 MED ORDER — TRAMADOL HCL 50 MG PO TABS: 50.0000 mg | ORAL_TABLET | Freq: Four times a day (QID) | ORAL | 0 refills | Status: AC | PRN

## 2024-11-26 ENCOUNTER — Other Ambulatory Visit: Payer: Self-pay

## 2024-11-26 ENCOUNTER — Ambulatory Visit: Admission: RE | Admit: 2024-11-26 | Discharge: 2024-11-26 | Attending: Radiation Oncology

## 2024-11-26 DIAGNOSIS — C3431 Malignant neoplasm of lower lobe, right bronchus or lung: Secondary | ICD-10-CM | POA: Diagnosis not present

## 2024-11-26 LAB — RAD ONC ARIA SESSION SUMMARY
Course Elapsed Days: 13
Plan Fractions Treated to Date: 10
Plan Prescribed Dose Per Fraction: 2 Gy
Plan Total Fractions Prescribed: 33
Plan Total Prescribed Dose: 66 Gy
Reference Point Dosage Given to Date: 20 Gy
Reference Point Session Dosage Given: 2 Gy
Session Number: 10

## 2024-11-27 ENCOUNTER — Other Ambulatory Visit: Payer: Self-pay

## 2024-11-27 ENCOUNTER — Ambulatory Visit: Admission: RE | Admit: 2024-11-27

## 2024-11-27 DIAGNOSIS — C3431 Malignant neoplasm of lower lobe, right bronchus or lung: Secondary | ICD-10-CM | POA: Diagnosis not present

## 2024-11-27 LAB — RAD ONC ARIA SESSION SUMMARY
Course Elapsed Days: 14
Plan Fractions Treated to Date: 11
Plan Prescribed Dose Per Fraction: 2 Gy
Plan Total Fractions Prescribed: 33
Plan Total Prescribed Dose: 66 Gy
Reference Point Dosage Given to Date: 22 Gy
Reference Point Session Dosage Given: 2 Gy
Session Number: 11

## 2024-11-28 ENCOUNTER — Ambulatory Visit

## 2024-11-28 ENCOUNTER — Other Ambulatory Visit: Payer: Self-pay

## 2024-11-28 DIAGNOSIS — C3431 Malignant neoplasm of lower lobe, right bronchus or lung: Secondary | ICD-10-CM | POA: Diagnosis not present

## 2024-11-28 LAB — RAD ONC ARIA SESSION SUMMARY
Course Elapsed Days: 15
Plan Fractions Treated to Date: 12
Plan Prescribed Dose Per Fraction: 2 Gy
Plan Total Fractions Prescribed: 33
Plan Total Prescribed Dose: 66 Gy
Reference Point Dosage Given to Date: 24 Gy
Reference Point Session Dosage Given: 2 Gy
Session Number: 12

## 2024-12-01 ENCOUNTER — Ambulatory Visit: Admission: RE | Admit: 2024-12-01 | Discharge: 2024-12-01 | Attending: Radiation Oncology

## 2024-12-01 ENCOUNTER — Other Ambulatory Visit: Payer: Self-pay

## 2024-12-01 DIAGNOSIS — C3431 Malignant neoplasm of lower lobe, right bronchus or lung: Secondary | ICD-10-CM | POA: Diagnosis not present

## 2024-12-01 LAB — RAD ONC ARIA SESSION SUMMARY
Course Elapsed Days: 18
Plan Fractions Treated to Date: 13
Plan Prescribed Dose Per Fraction: 2 Gy
Plan Total Fractions Prescribed: 33
Plan Total Prescribed Dose: 66 Gy
Reference Point Dosage Given to Date: 26 Gy
Reference Point Session Dosage Given: 2 Gy
Session Number: 13

## 2024-12-02 ENCOUNTER — Other Ambulatory Visit: Payer: Self-pay

## 2024-12-02 ENCOUNTER — Ambulatory Visit
Admission: RE | Admit: 2024-12-02 | Discharge: 2024-12-02 | Disposition: A | Discharge status: Home / Self Care | Source: Ambulatory Visit | Attending: Radiation Oncology | Admitting: Radiation Oncology

## 2024-12-02 DIAGNOSIS — C3431 Malignant neoplasm of lower lobe, right bronchus or lung: Secondary | ICD-10-CM | POA: Diagnosis not present

## 2024-12-02 LAB — RAD ONC ARIA SESSION SUMMARY
Course Elapsed Days: 19
Plan Fractions Treated to Date: 14
Plan Prescribed Dose Per Fraction: 2 Gy
Plan Total Fractions Prescribed: 33
Plan Total Prescribed Dose: 66 Gy
Reference Point Dosage Given to Date: 28 Gy
Reference Point Session Dosage Given: 2 Gy
Session Number: 14

## 2024-12-03 ENCOUNTER — Other Ambulatory Visit: Payer: Self-pay

## 2024-12-03 DIAGNOSIS — C3431 Malignant neoplasm of lower lobe, right bronchus or lung: Secondary | ICD-10-CM | POA: Diagnosis not present

## 2024-12-03 LAB — RAD ONC ARIA SESSION SUMMARY
Course Elapsed Days: 20
Plan Fractions Treated to Date: 15
Plan Prescribed Dose Per Fraction: 2 Gy
Plan Total Fractions Prescribed: 33
Plan Total Prescribed Dose: 66 Gy
Reference Point Dosage Given to Date: 30 Gy
Reference Point Session Dosage Given: 2 Gy
Session Number: 15

## 2024-12-08 ENCOUNTER — Ambulatory Visit
Admission: RE | Admit: 2024-12-08 | Discharge: 2024-12-08 | Disposition: A | Discharge status: Home / Self Care | Source: Ambulatory Visit | Attending: Radiation Oncology | Admitting: Radiation Oncology

## 2024-12-08 ENCOUNTER — Other Ambulatory Visit: Payer: Self-pay

## 2024-12-08 DIAGNOSIS — C3431 Malignant neoplasm of lower lobe, right bronchus or lung: Secondary | ICD-10-CM | POA: Diagnosis not present

## 2024-12-08 LAB — RAD ONC ARIA SESSION SUMMARY
Course Elapsed Days: 25
Plan Fractions Treated to Date: 16
Plan Prescribed Dose Per Fraction: 2 Gy
Plan Total Fractions Prescribed: 33
Plan Total Prescribed Dose: 66 Gy
Reference Point Dosage Given to Date: 32 Gy
Reference Point Session Dosage Given: 2 Gy
Session Number: 16

## 2024-12-09 ENCOUNTER — Other Ambulatory Visit: Payer: Self-pay

## 2024-12-09 ENCOUNTER — Ambulatory Visit
Admission: RE | Admit: 2024-12-09 | Discharge: 2024-12-09 | Disposition: A | Discharge status: Home / Self Care | Source: Ambulatory Visit | Attending: Radiation Oncology | Admitting: Radiation Oncology

## 2024-12-09 DIAGNOSIS — C3431 Malignant neoplasm of lower lobe, right bronchus or lung: Secondary | ICD-10-CM | POA: Diagnosis not present

## 2024-12-09 LAB — RAD ONC ARIA SESSION SUMMARY
Course Elapsed Days: 26
Plan Fractions Treated to Date: 17
Plan Prescribed Dose Per Fraction: 2 Gy
Plan Total Fractions Prescribed: 33
Plan Total Prescribed Dose: 66 Gy
Reference Point Dosage Given to Date: 34 Gy
Reference Point Session Dosage Given: 2 Gy
Session Number: 17

## 2024-12-10 ENCOUNTER — Ambulatory Visit
Admission: RE | Admit: 2024-12-10 | Discharge: 2024-12-10 | Disposition: A | Discharge status: Home / Self Care | Source: Ambulatory Visit | Attending: Radiation Oncology | Admitting: Radiation Oncology

## 2024-12-10 ENCOUNTER — Other Ambulatory Visit: Payer: Self-pay

## 2024-12-10 DIAGNOSIS — C3431 Malignant neoplasm of lower lobe, right bronchus or lung: Secondary | ICD-10-CM | POA: Diagnosis not present

## 2024-12-10 LAB — RAD ONC ARIA SESSION SUMMARY
Course Elapsed Days: 27
Plan Fractions Treated to Date: 18
Plan Prescribed Dose Per Fraction: 2 Gy
Plan Total Fractions Prescribed: 33
Plan Total Prescribed Dose: 66 Gy
Reference Point Dosage Given to Date: 36 Gy
Reference Point Session Dosage Given: 2 Gy
Session Number: 18

## 2024-12-12 ENCOUNTER — Other Ambulatory Visit: Payer: Self-pay

## 2024-12-12 ENCOUNTER — Ambulatory Visit
Admission: RE | Admit: 2024-12-12 | Discharge: 2024-12-12 | Disposition: A | Discharge status: Home / Self Care | Source: Ambulatory Visit | Attending: Radiation Oncology | Admitting: Radiation Oncology

## 2024-12-12 DIAGNOSIS — C3431 Malignant neoplasm of lower lobe, right bronchus or lung: Secondary | ICD-10-CM | POA: Diagnosis present

## 2024-12-12 DIAGNOSIS — Z510 Encounter for antineoplastic radiation therapy: Secondary | ICD-10-CM | POA: Diagnosis not present

## 2024-12-12 DIAGNOSIS — F17210 Nicotine dependence, cigarettes, uncomplicated: Secondary | ICD-10-CM | POA: Diagnosis not present

## 2024-12-12 LAB — RAD ONC ARIA SESSION SUMMARY
Course Elapsed Days: 29
Plan Fractions Treated to Date: 19
Plan Prescribed Dose Per Fraction: 2 Gy
Plan Total Fractions Prescribed: 33
Plan Total Prescribed Dose: 66 Gy
Reference Point Dosage Given to Date: 38 Gy
Reference Point Session Dosage Given: 2 Gy
Session Number: 19

## 2024-12-15 ENCOUNTER — Other Ambulatory Visit: Payer: Self-pay

## 2024-12-15 ENCOUNTER — Ambulatory Visit
Admission: RE | Admit: 2024-12-15 | Discharge: 2024-12-15 | Disposition: A | Discharge status: Home / Self Care | Source: Ambulatory Visit | Attending: Radiation Oncology | Admitting: Radiation Oncology

## 2024-12-15 DIAGNOSIS — C3431 Malignant neoplasm of lower lobe, right bronchus or lung: Secondary | ICD-10-CM | POA: Diagnosis not present

## 2024-12-15 LAB — RAD ONC ARIA SESSION SUMMARY
Course Elapsed Days: 32
Plan Fractions Treated to Date: 20
Plan Prescribed Dose Per Fraction: 2 Gy
Plan Total Fractions Prescribed: 33
Plan Total Prescribed Dose: 66 Gy
Reference Point Dosage Given to Date: 40 Gy
Reference Point Session Dosage Given: 2 Gy
Session Number: 20

## 2024-12-16 ENCOUNTER — Other Ambulatory Visit: Payer: Self-pay

## 2024-12-16 ENCOUNTER — Ambulatory Visit
Admission: RE | Admit: 2024-12-16 | Discharge: 2024-12-16 | Attending: Radiation Oncology | Admitting: Radiation Oncology

## 2024-12-16 ENCOUNTER — Ambulatory Visit
Admission: RE | Admit: 2024-12-16 | Discharge: 2024-12-16 | Disposition: A | Source: Ambulatory Visit | Attending: Radiation Oncology

## 2024-12-16 ENCOUNTER — Ambulatory Visit: Admitting: Radiation Oncology

## 2024-12-16 ENCOUNTER — Other Ambulatory Visit: Payer: Self-pay | Admitting: Radiation Oncology

## 2024-12-16 DIAGNOSIS — C3431 Malignant neoplasm of lower lobe, right bronchus or lung: Secondary | ICD-10-CM | POA: Diagnosis not present

## 2024-12-16 LAB — RAD ONC ARIA SESSION SUMMARY
Course Elapsed Days: 33
Plan Fractions Treated to Date: 21
Plan Prescribed Dose Per Fraction: 2 Gy
Plan Total Fractions Prescribed: 33
Plan Total Prescribed Dose: 66 Gy
Reference Point Dosage Given to Date: 42 Gy
Reference Point Session Dosage Given: 2 Gy
Session Number: 21

## 2024-12-16 MED ORDER — GABAPENTIN 300 MG PO CAPS: 300.0000 mg | ORAL_CAPSULE | Freq: Every evening | ORAL | 0 refills | Status: AC

## 2024-12-17 ENCOUNTER — Other Ambulatory Visit: Payer: Self-pay

## 2024-12-17 ENCOUNTER — Ambulatory Visit
Admission: RE | Admit: 2024-12-17 | Discharge: 2024-12-17 | Disposition: A | Discharge status: Home / Self Care | Source: Ambulatory Visit | Attending: Radiation Oncology | Admitting: Radiation Oncology

## 2024-12-17 DIAGNOSIS — C3431 Malignant neoplasm of lower lobe, right bronchus or lung: Secondary | ICD-10-CM | POA: Diagnosis not present

## 2024-12-17 LAB — RAD ONC ARIA SESSION SUMMARY
Course Elapsed Days: 34
Plan Fractions Treated to Date: 22
Plan Prescribed Dose Per Fraction: 2 Gy
Plan Total Fractions Prescribed: 33
Plan Total Prescribed Dose: 66 Gy
Reference Point Dosage Given to Date: 44 Gy
Reference Point Session Dosage Given: 2 Gy
Session Number: 22

## 2024-12-18 ENCOUNTER — Ambulatory Visit
Admission: RE | Admit: 2024-12-18 | Discharge: 2024-12-18 | Disposition: A | Discharge status: Home / Self Care | Source: Ambulatory Visit | Attending: Radiation Oncology | Admitting: Radiation Oncology

## 2024-12-18 ENCOUNTER — Other Ambulatory Visit: Payer: Self-pay

## 2024-12-18 DIAGNOSIS — C3431 Malignant neoplasm of lower lobe, right bronchus or lung: Secondary | ICD-10-CM | POA: Diagnosis not present

## 2024-12-18 LAB — RAD ONC ARIA SESSION SUMMARY
Course Elapsed Days: 35
Plan Fractions Treated to Date: 23
Plan Prescribed Dose Per Fraction: 2 Gy
Plan Total Fractions Prescribed: 33
Plan Total Prescribed Dose: 66 Gy
Reference Point Dosage Given to Date: 46 Gy
Reference Point Session Dosage Given: 2 Gy
Session Number: 23

## 2024-12-19 ENCOUNTER — Other Ambulatory Visit: Payer: Self-pay

## 2024-12-19 ENCOUNTER — Ambulatory Visit
Admission: RE | Admit: 2024-12-19 | Discharge: 2024-12-19 | Disposition: A | Source: Ambulatory Visit | Attending: Radiation Oncology

## 2024-12-19 DIAGNOSIS — C3431 Malignant neoplasm of lower lobe, right bronchus or lung: Secondary | ICD-10-CM | POA: Diagnosis not present

## 2024-12-19 LAB — RAD ONC ARIA SESSION SUMMARY
Course Elapsed Days: 36
Plan Fractions Treated to Date: 24
Plan Prescribed Dose Per Fraction: 2 Gy
Plan Total Fractions Prescribed: 33
Plan Total Prescribed Dose: 66 Gy
Reference Point Dosage Given to Date: 48 Gy
Reference Point Session Dosage Given: 2 Gy
Session Number: 24

## 2024-12-22 ENCOUNTER — Other Ambulatory Visit: Payer: Self-pay

## 2024-12-22 ENCOUNTER — Ambulatory Visit
Admission: RE | Admit: 2024-12-22 | Discharge: 2024-12-22 | Disposition: A | Source: Ambulatory Visit | Attending: Radiation Oncology

## 2024-12-22 DIAGNOSIS — C3431 Malignant neoplasm of lower lobe, right bronchus or lung: Secondary | ICD-10-CM | POA: Diagnosis not present

## 2024-12-22 LAB — RAD ONC ARIA SESSION SUMMARY
Course Elapsed Days: 39
Plan Fractions Treated to Date: 25
Plan Prescribed Dose Per Fraction: 2 Gy
Plan Total Fractions Prescribed: 33
Plan Total Prescribed Dose: 66 Gy
Reference Point Dosage Given to Date: 50 Gy
Reference Point Session Dosage Given: 2 Gy
Session Number: 25

## 2024-12-23 ENCOUNTER — Ambulatory Visit
Admission: RE | Admit: 2024-12-23 | Discharge: 2024-12-23 | Disposition: A | Source: Ambulatory Visit | Attending: Radiation Oncology | Admitting: Radiation Oncology

## 2024-12-23 ENCOUNTER — Other Ambulatory Visit: Payer: Self-pay

## 2024-12-23 ENCOUNTER — Ambulatory Visit
Admission: RE | Admit: 2024-12-23 | Discharge: 2024-12-23 | Disposition: A | Source: Ambulatory Visit | Attending: Radiation Oncology

## 2024-12-23 DIAGNOSIS — C3431 Malignant neoplasm of lower lobe, right bronchus or lung: Secondary | ICD-10-CM | POA: Diagnosis not present

## 2024-12-23 LAB — RAD ONC ARIA SESSION SUMMARY
Course Elapsed Days: 40
Plan Fractions Treated to Date: 26
Plan Prescribed Dose Per Fraction: 2 Gy
Plan Total Fractions Prescribed: 33
Plan Total Prescribed Dose: 66 Gy
Reference Point Dosage Given to Date: 52 Gy
Reference Point Session Dosage Given: 2 Gy
Session Number: 26

## 2024-12-24 ENCOUNTER — Ambulatory Visit
Admission: RE | Admit: 2024-12-24 | Discharge: 2024-12-24 | Disposition: A | Discharge status: Home / Self Care | Source: Ambulatory Visit | Attending: Radiation Oncology | Admitting: Radiation Oncology

## 2024-12-24 ENCOUNTER — Other Ambulatory Visit: Payer: Self-pay

## 2024-12-24 DIAGNOSIS — C3431 Malignant neoplasm of lower lobe, right bronchus or lung: Secondary | ICD-10-CM | POA: Diagnosis not present

## 2024-12-24 LAB — RAD ONC ARIA SESSION SUMMARY
Course Elapsed Days: 41
Plan Fractions Treated to Date: 27
Plan Prescribed Dose Per Fraction: 2 Gy
Plan Total Fractions Prescribed: 33
Plan Total Prescribed Dose: 66 Gy
Reference Point Dosage Given to Date: 54 Gy
Reference Point Session Dosage Given: 2 Gy
Session Number: 27

## 2024-12-25 ENCOUNTER — Other Ambulatory Visit: Payer: Self-pay

## 2024-12-25 ENCOUNTER — Ambulatory Visit
Admission: RE | Admit: 2024-12-25 | Discharge: 2024-12-25 | Disposition: A | Source: Ambulatory Visit | Attending: Radiation Oncology

## 2024-12-25 DIAGNOSIS — C3431 Malignant neoplasm of lower lobe, right bronchus or lung: Secondary | ICD-10-CM | POA: Diagnosis not present

## 2024-12-25 LAB — RAD ONC ARIA SESSION SUMMARY
Course Elapsed Days: 42
Plan Fractions Treated to Date: 28
Plan Prescribed Dose Per Fraction: 2 Gy
Plan Total Fractions Prescribed: 33
Plan Total Prescribed Dose: 66 Gy
Reference Point Dosage Given to Date: 56 Gy
Reference Point Session Dosage Given: 2 Gy
Session Number: 28

## 2024-12-26 ENCOUNTER — Ambulatory Visit
Admission: RE | Admit: 2024-12-26 | Discharge: 2024-12-26 | Disposition: A | Discharge status: Home / Self Care | Source: Ambulatory Visit | Attending: Radiation Oncology | Admitting: Radiation Oncology

## 2024-12-26 ENCOUNTER — Other Ambulatory Visit: Payer: Self-pay

## 2024-12-26 DIAGNOSIS — C3431 Malignant neoplasm of lower lobe, right bronchus or lung: Secondary | ICD-10-CM | POA: Diagnosis not present

## 2024-12-26 LAB — RAD ONC ARIA SESSION SUMMARY
Course Elapsed Days: 43
Plan Fractions Treated to Date: 29
Plan Prescribed Dose Per Fraction: 2 Gy
Plan Total Fractions Prescribed: 33
Plan Total Prescribed Dose: 66 Gy
Reference Point Dosage Given to Date: 58 Gy
Reference Point Session Dosage Given: 2 Gy
Session Number: 29

## 2024-12-29 ENCOUNTER — Other Ambulatory Visit: Payer: Self-pay

## 2024-12-29 ENCOUNTER — Ambulatory Visit
Admission: RE | Admit: 2024-12-29 | Discharge: 2024-12-29 | Disposition: A | Source: Ambulatory Visit | Attending: Radiation Oncology

## 2024-12-29 DIAGNOSIS — C3431 Malignant neoplasm of lower lobe, right bronchus or lung: Secondary | ICD-10-CM | POA: Diagnosis not present

## 2024-12-29 LAB — RAD ONC ARIA SESSION SUMMARY
Course Elapsed Days: 46
Plan Fractions Treated to Date: 30
Plan Prescribed Dose Per Fraction: 2 Gy
Plan Total Fractions Prescribed: 33
Plan Total Prescribed Dose: 66 Gy
Reference Point Dosage Given to Date: 60 Gy
Reference Point Session Dosage Given: 2 Gy
Session Number: 30

## 2024-12-30 ENCOUNTER — Ambulatory Visit: Admitting: Radiation Oncology

## 2024-12-30 ENCOUNTER — Ambulatory Visit
Admission: RE | Admit: 2024-12-30 | Discharge: 2024-12-30 | Disposition: A | Source: Ambulatory Visit | Attending: Radiation Oncology

## 2024-12-30 ENCOUNTER — Other Ambulatory Visit: Payer: Self-pay

## 2024-12-30 ENCOUNTER — Encounter: Payer: Self-pay | Admitting: Hematology

## 2024-12-30 ENCOUNTER — Ambulatory Visit
Admission: RE | Admit: 2024-12-30 | Discharge: 2024-12-30 | Disposition: A | Source: Ambulatory Visit | Attending: Radiation Oncology | Admitting: Radiation Oncology

## 2024-12-30 DIAGNOSIS — C3431 Malignant neoplasm of lower lobe, right bronchus or lung: Secondary | ICD-10-CM | POA: Diagnosis not present

## 2024-12-30 LAB — RAD ONC ARIA SESSION SUMMARY
Course Elapsed Days: 47
Plan Fractions Treated to Date: 31
Plan Prescribed Dose Per Fraction: 2 Gy
Plan Total Fractions Prescribed: 33
Plan Total Prescribed Dose: 66 Gy
Reference Point Dosage Given to Date: 62 Gy
Reference Point Session Dosage Given: 2 Gy
Session Number: 31

## 2024-12-31 ENCOUNTER — Other Ambulatory Visit: Payer: Self-pay

## 2024-12-31 ENCOUNTER — Ambulatory Visit
Admission: RE | Admit: 2024-12-31 | Discharge: 2024-12-31 | Disposition: A | Source: Ambulatory Visit | Attending: Radiation Oncology

## 2024-12-31 DIAGNOSIS — C3431 Malignant neoplasm of lower lobe, right bronchus or lung: Secondary | ICD-10-CM | POA: Diagnosis not present

## 2024-12-31 DIAGNOSIS — C9000 Multiple myeloma not having achieved remission: Secondary | ICD-10-CM

## 2024-12-31 DIAGNOSIS — C3490 Malignant neoplasm of unspecified part of unspecified bronchus or lung: Secondary | ICD-10-CM

## 2024-12-31 LAB — RAD ONC ARIA SESSION SUMMARY
Course Elapsed Days: 48
Plan Fractions Treated to Date: 32
Plan Prescribed Dose Per Fraction: 2 Gy
Plan Total Fractions Prescribed: 33
Plan Total Prescribed Dose: 66 Gy
Reference Point Dosage Given to Date: 64 Gy
Reference Point Session Dosage Given: 2 Gy
Session Number: 32

## 2025-01-01 ENCOUNTER — Inpatient Hospital Stay (HOSPITAL_BASED_OUTPATIENT_CLINIC_OR_DEPARTMENT_OTHER): Admitting: Hematology

## 2025-01-01 ENCOUNTER — Inpatient Hospital Stay: Attending: Hematology

## 2025-01-01 ENCOUNTER — Inpatient Hospital Stay: Admitting: Hematology

## 2025-01-01 ENCOUNTER — Other Ambulatory Visit: Payer: Self-pay

## 2025-01-01 ENCOUNTER — Ambulatory Visit
Admission: RE | Admit: 2025-01-01 | Discharge: 2025-01-01 | Disposition: A | Discharge status: Home / Self Care | Source: Ambulatory Visit | Attending: Radiation Oncology | Admitting: Radiation Oncology

## 2025-01-01 ENCOUNTER — Inpatient Hospital Stay

## 2025-01-01 VITALS — BP 130/73 | HR 103 | Temp 98.2°F | Resp 18 | Wt 144.4 lb

## 2025-01-01 DIAGNOSIS — C9000 Multiple myeloma not having achieved remission: Secondary | ICD-10-CM | POA: Diagnosis not present

## 2025-01-01 DIAGNOSIS — N289 Disorder of kidney and ureter, unspecified: Secondary | ICD-10-CM | POA: Diagnosis not present

## 2025-01-01 DIAGNOSIS — C3490 Malignant neoplasm of unspecified part of unspecified bronchus or lung: Secondary | ICD-10-CM

## 2025-01-01 DIAGNOSIS — Z79899 Other long term (current) drug therapy: Secondary | ICD-10-CM | POA: Diagnosis not present

## 2025-01-01 DIAGNOSIS — F17210 Nicotine dependence, cigarettes, uncomplicated: Secondary | ICD-10-CM | POA: Insufficient documentation

## 2025-01-01 DIAGNOSIS — Z923 Personal history of irradiation: Secondary | ICD-10-CM | POA: Diagnosis not present

## 2025-01-01 DIAGNOSIS — C9011 Plasma cell leukemia in remission: Secondary | ICD-10-CM | POA: Insufficient documentation

## 2025-01-01 DIAGNOSIS — C3431 Malignant neoplasm of lower lobe, right bronchus or lung: Secondary | ICD-10-CM | POA: Diagnosis not present

## 2025-01-01 DIAGNOSIS — C9001 Multiple myeloma in remission: Secondary | ICD-10-CM | POA: Diagnosis present

## 2025-01-01 DIAGNOSIS — Z7189 Other specified counseling: Secondary | ICD-10-CM

## 2025-01-01 DIAGNOSIS — D630 Anemia in neoplastic disease: Secondary | ICD-10-CM | POA: Diagnosis not present

## 2025-01-01 DIAGNOSIS — Z9221 Personal history of antineoplastic chemotherapy: Secondary | ICD-10-CM | POA: Diagnosis not present

## 2025-01-01 LAB — RAD ONC ARIA SESSION SUMMARY
Course Elapsed Days: 49
Plan Fractions Treated to Date: 33
Plan Prescribed Dose Per Fraction: 2 Gy
Plan Total Fractions Prescribed: 33
Plan Total Prescribed Dose: 66 Gy
Reference Point Dosage Given to Date: 66 Gy
Reference Point Session Dosage Given: 2 Gy
Session Number: 33

## 2025-01-01 LAB — CMP (CANCER CENTER ONLY)
ALT: 11 U/L (ref 0–44)
AST: 16 U/L (ref 15–41)
Albumin: 3.9 g/dL (ref 3.5–5.0)
Alkaline Phosphatase: 87 U/L (ref 38–126)
Anion gap: 12 (ref 5–15)
BUN: 20 mg/dL (ref 8–23)
CO2: 24 mmol/L (ref 22–32)
Calcium: 9.1 mg/dL (ref 8.9–10.3)
Chloride: 104 mmol/L (ref 98–111)
Creatinine: 1.44 mg/dL — ABNORMAL HIGH (ref 0.61–1.24)
GFR, Estimated: 53 mL/min — ABNORMAL LOW
Glucose, Bld: 94 mg/dL (ref 70–99)
Potassium: 4.4 mmol/L (ref 3.5–5.1)
Sodium: 140 mmol/L (ref 135–145)
Total Bilirubin: 0.3 mg/dL (ref 0.0–1.2)
Total Protein: 7.4 g/dL (ref 6.5–8.1)

## 2025-01-01 LAB — CBC WITH DIFFERENTIAL (CANCER CENTER ONLY)
Abs Immature Granulocytes: 0.02 K/uL (ref 0.00–0.07)
Basophils Absolute: 0.1 K/uL (ref 0.0–0.1)
Basophils Relative: 1 %
Eosinophils Absolute: 0.1 K/uL (ref 0.0–0.5)
Eosinophils Relative: 1 %
HCT: 35.5 % — ABNORMAL LOW (ref 39.0–52.0)
Hemoglobin: 12 g/dL — ABNORMAL LOW (ref 13.0–17.0)
Immature Granulocytes: 0 %
Lymphocytes Relative: 11 %
Lymphs Abs: 0.7 K/uL (ref 0.7–4.0)
MCH: 29.6 pg (ref 26.0–34.0)
MCHC: 33.8 g/dL (ref 30.0–36.0)
MCV: 87.4 fL (ref 80.0–100.0)
Monocytes Absolute: 1.1 K/uL — ABNORMAL HIGH (ref 0.1–1.0)
Monocytes Relative: 17 %
Neutro Abs: 4.8 K/uL (ref 1.7–7.7)
Neutrophils Relative %: 70 %
Platelet Count: 191 K/uL (ref 150–400)
RBC: 4.06 MIL/uL — ABNORMAL LOW (ref 4.22–5.81)
RDW: 15.3 % (ref 11.5–15.5)
WBC Count: 6.8 K/uL (ref 4.0–10.5)
nRBC: 0 % (ref 0.0–0.2)

## 2025-01-01 LAB — TSH: TSH: 1.58 u[IU]/mL (ref 0.350–4.500)

## 2025-01-01 LAB — T4, FREE: Free T4: 1.15 ng/dL (ref 0.80–2.00)

## 2025-01-01 NOTE — Progress Notes (Signed)
 " HEMATOLOGY ONCOLOGY PROGRESS NOTE  Date of service: 01/01/2025  Patient Care Team: Dia Vina HERO, NP-C (Inactive) as PCP - General (Nurse Practitioner) [name], MD as Consulting Physician (Hematology)  CHIEF COMPLAINT/PURPOSE OF CONSULTATION: Follow-up for continued evaluation and management of lung ademocarcinoma and multiple myeloma.  HISTORY OF PRESENTING ILLNESS: (12/24/2017) Pt is being seen as outpatient today for hospital fu of his plasma cell leukemia. His labs today 12/14/2017 show hgb 8.3, platelets 524k. He is doing well overall. He has staples in his right upper leg following a recent surgery and has not been in touch with the surgeons for a f/u of this. He is not receiving physical therapy and is not getting around at home.  He notes that he was not scheduled for a post hospitalization nephrology f/u and has no PCP.    On review of systems, pt reports intermittent fatigue, SOB, pain to the left leg, dizziness, dry mouth and denies fever, chills, night sweats, dysuria and any other accompanying symptoms.    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   Plasma cell leukemia (HCC)   11/30/2017 - 10/30/2018 Chemotherapy   The patient had dexamethasone  (DECADRON ) 4 MG tablet, 1 of 1 cycle, Start date: --, End date: -- bortezomib  SQ (VELCADE ) chemo injection 2.5 mg, 1.3 mg/m2 = 2.5 mg, Subcutaneous,  Once, 14 of 15 cycles Administration: 2.25 mg (12/01/2017), 2.25 mg (12/04/2017), 2.25 mg (12/20/2017), 2.25 mg (12/24/2017), 2.25 mg (12/28/2017), 2.25 mg (12/31/2017), 2.25 mg (01/11/2018), 2.25 mg (01/14/2018), 2.25 mg (01/18/2018), 2.25 mg (01/21/2018), 2.25 mg (02/01/2018), 2.25 mg (02/04/2018), 2.25 mg (02/08/2018), 2.25 mg (02/11/2018), 2.25 mg (02/22/2018), 2.25 mg (02/25/2018), 2.25 mg (03/01/2018), 2.25 mg (03/04/2018), 2.25 mg (03/15/2018), 2.25 mg (03/18/2018), 2.25 mg (03/22/2018), 2.25 mg (03/25/2018), 2.25 mg (04/05/2018), 2.25  mg (04/08/2018), 2.25 mg (04/12/2018), 2.25 mg (04/15/2018), 2.25 mg (04/26/2018), 2.25 mg (04/29/2018), 2.25 mg (05/03/2018), 2.25 mg (05/07/2018), 2.25 mg (05/17/2018), 2.25 mg (05/20/2018), 2.25 mg (05/24/2018), 2.25 mg (05/27/2018), 2.25 mg (06/07/2018), 2.25 mg (06/10/2018), 2.25 mg (06/14/2018), 2.25 mg (06/17/2018), 2.25 mg (06/28/2018), 2.25 mg (07/02/2018), 2.25 mg (07/05/2018), 2.25 mg (07/08/2018), 2.25 mg (07/19/2018), 2.25 mg (07/22/2018), 2.25 mg (07/26/2018), 2.25 mg (07/29/2018), 2.25 mg (08/09/2018), 2.25 mg (08/13/2018), 2.25 mg (08/16/2018), 2.25 mg (08/19/2018), 2.5 mg (10/30/2018)  for chemotherapy treatment.    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)   08/13/2019 - 08/18/2022 Chemotherapy   Patient is on Treatment Plan : MYELOMA RELAPSED/REFRACTORY Carfilzomib  + Dexamethasone  (Kd) weekly q28d     09/15/2022 - 04/11/2024 Chemotherapy   Patient is on Treatment Plan : MYELOMA RELAPSED/REFRACTORY Carfilzomib  (20/70) D1,8,15 + Dexamethasone  weekly (40) (Kd) q28d  x 9 cycles / Dexamethasone  D1,8,15     Primary lung adenocarcinoma (HCC)  06/26/2024 Initial Diagnosis   Primary lung adenocarcinoma (HCC)   07/04/2024 -  Chemotherapy   Patient is on Treatment Plan : LUNG Carboplatin  (5) + Pemetrexed  (500) + Pembrolizumab  (200) D1 q21d Induction x 4  cycles / Maintenance Pemetrexed  (500) + Pembrolizumab  (200) D1 q21d       INTERVAL HISTORY:  Andres Fernandez is a 69 y.o. male who is here  today for continued evaluation and management of his lung adenocarcinoma and multiple myeloma.   he was last seen by me on 11/05/2024; at the time he did not have any concerns and was doing well.   Patient has completed his planned consolidated RT for lung cancer on 1/22/206. He notes some grade 2 fatigue and some sensitization of skin in area over chestwall. No new cough or bleeding. Discussed subsequent treatment consideration for lung cancer and Myeloma.  REVIEW OF SYSTEMS:   10 Point review of systems of done and is negative except as noted above.  MEDICAL HISTORY Past Medical History:  Diagnosis Date   AKI (acute kidney injury)    ARF (acute renal failure)    BPH (benign prostatic hyperplasia)    Cancer (HCC)    COPD (chronic obstructive pulmonary disease) (HCC)    Hepatitis C 11/27/2017   Hypertension    Multiple myeloma (HCC)    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   SIRS (systemic inflammatory response syndrome) (HCC)    Thrombocytopenia    Tobacco abuse     SURGICAL HISTORY Past Surgical History:  Procedure Laterality Date   APPENDECTOMY     BRONCHOSCOPY, WITH BIOPSY USING ELECTROMAGNETIC NAVIGATION Bilateral 04/29/2024   Procedure: ROBOTIC ASSISTED NAVIGATIONAL BRONCHOSCOPY;  Surgeon: Isadora Hose, MD;  Location: [facility] ORS;  Service: Pulmonary;  Laterality: Bilateral;   ENDOBRONCHIAL ULTRASOUND Bilateral 04/29/2024   Procedure: ENDOBRONCHIAL ULTRASOUND (EBUS);  Surgeon: Isadora Hose, MD;  Location: [facility] ORS;  Service: Pulmonary;  Laterality: Bilateral;   FEMUR IM NAIL Right 11/20/2017   Procedure: RIGHT FEMORAL INTRAMEDULLARY (IM) NAIL;  Surgeon: Sharl Selinda Dover, MD;  Location: [facility] OR;  Service: Orthopedics;  Laterality: Right;   IR IMAGING GUIDED PORT INSERTION  08/19/2021   PENILE PROSTHESIS IMPLANT      SOCIAL HISTORY Social History[1]  Social History   Social History Narrative   Not on file    SOCIAL DRIVERS OF HEALTH SDOH Screenings   Food  Insecurity: No Food Insecurity (11/03/2024)  Housing: Low Risk (11/03/2024)  Transportation Needs: No Transportation Needs (11/03/2024)  Utilities: Not At Risk (11/03/2024)  Alcohol Screen: Low Risk (11/03/2024)  Depression (PHQ2-9): Low Risk (11/19/2024)  Social Connections: Moderately Integrated (04/17/2024)  Stress: No Stress Concern Present (11/03/2024)  Tobacco Use: High Risk (10/03/2024)     FAMILY HISTORY Family History  Problem Relation Age of Onset   COPD Mother    Liver cancer Mother      ALLERGIES: has no known allergies.  MEDICATIONS  Current Outpatient Medications  Medication Sig Dispense Refill   acyclovir  (ZOVIRAX ) 400 MG tablet Take 1 tablet (400 mg total) by mouth daily. 90 tablet 6   albuterol  (VENTOLIN  HFA) 108 (90 Base) MCG/ACT inhaler Inhale 2 puffs into the lungs every 6 (six) hours as needed for shortness of breath.     apixaban  (ELIQUIS ) 5 MG TABS tablet Take 1 tablet (5 mg total) by mouth 2 (two) times daily. 60 tablet 5   ferrous sulfate  325 (65 FE) MG EC tablet TAKE 1 TABLET BY MOUTH EVERY DAY WITH BREAKFAST (Patient not taking: Reported on 11/03/2024) 90 tablet 1   Fluticasone Propionate , Inhal, (FLUTICASONE PROPIONATE  DISKUS) 250 MCG/ACT AEPB TAKE 1 PUFF BY MOUTH EVERY DAY 60 each 1   gabapentin  (NEURONTIN ) 300 MG capsule Take 1 capsule (300 mg  total) by mouth at bedtime. 30 capsule 0   lenalidomide  (REVLIMID ) 10 MG capsule Take 1 capsule (10 mg total) by mouth daily. Take 1 capsule (10 mg total) by mouth daily for 21 days. Take 7 days off. Repeat. 21 capsule 0   lidocaine -prilocaine  (EMLA ) cream Apply 1 Application topically as needed. Apply to Port-A-Cath at least 1 hr prior to treatment. 30 g 0   Multiple Vitamins-Minerals (MULTIVITAMIN WITH MINERALS) tablet Take 1 tablet by mouth at bedtime.     naproxen  (NAPROSYN ) 375 MG tablet Take 1 tablet (375 mg total) by mouth 2 (two) times daily. 20 tablet 0   ondansetron  (ZOFRAN ) 8 MG tablet Take 1 tablet (8  mg total) by mouth every 8 (eight) hours as needed for nausea or vomiting. 20 tablet 0   prochlorperazine  (COMPAZINE ) 10 MG tablet Take 1 tablet (10 mg total) by mouth every 6 (six) hours as needed for nausea or vomiting. 30 tablet 0   Spacer/Aero-Holding Chambers (AEROCHAMBER MV) inhaler Use as instructed 1 each 0   traMADol  (ULTRAM ) 50 MG tablet Take 1 tablet (50 mg total) by mouth every 6 (six) hours as needed. 30 tablet 0   valACYclovir  (VALTREX ) 1000 MG tablet Take 1 tablet (1,000 mg total) by mouth 3 (three) times daily. 21 tablet 0   No current facility-administered medications for this visit.   Facility-Administered Medications Ordered in Other Visits  Medication Dose Route Frequency Provider Last Rate Last Admin   acetaminophen  (TYLENOL ) 325 MG tablet            famotidine  (PEPCID ) 20 MG tablet             PHYSICAL EXAMINATION: ECOG PERFORMANCE STATUS: 1 - Symptomatic but completely ambulatory VITALS: Vitals:   01/01/25 1232  BP: 130/73  Pulse: (!) 103  Resp: 18  Temp: 98.2 F (36.8 C)  SpO2: 99%   Filed Weights   01/01/25 1232  Weight: 144 lb 6.4 oz (65.5 kg)   Body mass index is 21.82 kg/m.  GENERAL: alert, in no acute distress and comfortable SKIN: no acute rashes, no significant lesions EYES: conjunctiva are pink and non-injected, sclera anicteric OROPHARYNX: MMM, no exudates, no oropharyngeal erythema or ulceration NECK: supple, no JVD LYMPH:  no palpable lymphadenopathy in the cervical, axillary or inguinal regions LUNGS: clear to auscultation b/l with normal respiratory effort HEART: regular rate & rhythm ABDOMEN:  normoactive bowel sounds , non tender, not distended, no hepatosplenomegaly Extremity: no pedal edema PSYCH: alert & oriented x 3 with fluent speech NEURO: no focal motor/sensory deficits  LABORATORY DATA:   I have reviewed the data as listed     Latest Ref Rng & Units 01/01/2025   11:25 AM 11/05/2024    7:58 AM 09/26/2024    8:59 AM   CBC EXTENDED  WBC 4.0 - 10.5 K/uL 6.8  8.6  17.1   RBC 4.22 - 5.81 MIL/uL 4.06  3.16  2.51   Hemoglobin 13.0 - 17.0 g/dL 87.9  89.5  8.0   HCT 60.9 - 52.0 % 35.5  31.5  23.6   Platelets 150 - 400 K/uL 191  217  109   NEUT# 1.7 - 7.7 K/uL 4.8  6.3  14.1   Lymph# 0.7 - 4.0 K/uL 0.7  1.5  1.5        Latest Ref Rng & Units 01/01/2025   11:25 AM 11/05/2024    7:58 AM 09/26/2024    8:59 AM  CMP  Glucose 70 - 99 mg/dL  94  152  143   BUN 8 - 23 mg/dL 20  18  22    Creatinine 0.61 - 1.24 mg/dL 8.55  8.64  8.67   Sodium 135 - 145 mmol/L 140  141  141   Potassium 3.5 - 5.1 mmol/L 4.4  3.8  4.0   Chloride 98 - 111 mmol/L 104  106  107   CO2 22 - 32 mmol/L 24  22  24    Calcium 8.9 - 10.3 mg/dL 9.1  9.3  9.8   Total Protein 6.5 - 8.1 g/dL 7.4  7.5  7.4   Total Bilirubin 0.0 - 1.2 mg/dL 0.3  0.2  0.3   Alkaline Phos 38 - 126 U/L 87  83  107   AST 15 - 41 U/L 16  14  15    ALT 0 - 44 U/L 11  13  14     MULTIPLE MYELOMA PANEL 06/2023 - 09/2024 & KAPPA/LAMBDA LIGHT CHAINS 03/2024 - 09/2024     09/26/2024 GUARDANT 360:         Review Flowsheet         Latest Ref Rng & Units 05/15/2024  Spirometry  FVC-%Pred-Pre % 89   FVC-%Pred-Post % 85   FEV1-Pre L 1.87   FEV1-%Pred-Pre % 59   FEV1-Post L 1.93   FEV1-%Pred-Post % 61   DLCO unc ml/min/mmHg 8.52     Guardant 360 06/11/2024:               04/29/2024 Cytopathology report:   Fine needle Aspiration, RLL mass - Positive for malignancy, adenocarcinoma Bronchial Brushing, RLL - Positive for malignancy, adenocarcinoma Fine needle aspiration, lymph node station 11L negative for malignancy Fine needle aspiration lymph node 7, station 7, negative for malignancy   03/09/20 ([id number]) Bone Marrow Biopsy at [facility]    07/02/2019 Bone Marrow Biopsy ([id number]) at [facility]      08/07/18 BM Bx:              RADIOGRAPHIC STUDIES: I have personally reviewed the radiological images as listed and agreed  with the findings in the report. No results found.  ASSESSMENT & PLAN:  69 y.o. 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    Initial BM Bx showed >95% involvement with kappa restricted serum free light chains. 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 MoL Cy-    08/07/18 BM Bx revealed normocellular bone marrow with trilineage hematopoiesis and 1% plasma cells which indicates the pt is in remission 08/06/18 PET/CT revealed  Expansile lytic lesion involving the left iliac bone, unchanged. Lytic lesion involving the sternum, unchanged. While both are suspicious for myeloma, neither lesion is FDG avid. No hypermetabolic osseous lesions in the visualized axial and appendicular skeleton. Please note that the bilateral lower extremities were not imaged. No suspicious lymphadenopathy. Spleen is normal in size.     Pt began Ninlaro  3mg  maintenance after pt finished 13 cycles of CyBorD and BM bx showed only 1% plasma cells, then resumed CyBorD and held Ninlaro  in anticipation of his tandem bone marrow transplants beginning in late December 2019 with Dr. Rogue Stallion at [facility]    11/19/18 High dose chemotherapy with Melphalan 140mg /m2 and autologous stem cell rescue   03/20/19  High dose chemotherapy with Melphalan 200mg /m2 and autologous stem cell rescue. Non-infectious diarrhea, uncomplicated course otherwise.   07/02/2019 PET scan with results revealing 1. No FDG avid bone lesions are identified. 2.  Asymmetric uptake within the right, greater than left, palatine tonsils with mild fullness of the right tonsillar soft tissue. ENT consult vs attention on follow up suggested. 3.  Innumerable mixed sclerotic and lytic  lesions throughout the axial and appendicular skeleton without associated hypermetabolic activity. 4.  Ancillary CT findings as above.   07/02/2019 bone marrow biopsy ([id number])with results showing: Trilineage hematopoieses with maturation. No plasma cell myeloma identified. Mild anemia and No Rouleaux or circulating plasma cells identified in the peripheral blood.   2) s/p Prophylactic IM nailing for rt femur  completed [facility] at the [facility].  -patient previously reported improvement and is currently progressively back to full time work.   3) Thrombocytopenia resolved    4) Renal insufficiency --likely related to multiple myeloma.  Remained fairly stable with baseline creatinine of 1.5-2.1.   Plan -maintain follow up with nephrology referral to Dr Rayburn [facility] for continued treatment -Pt completed 8 weeks of maverick for hep c with Dr Efrain on 07/08/18.  -Kidney numbers improved. Creatinine in the 1.7-2.2 range    5) h/o tobacco abuse-counseled on smoking cessation -Currently not smoking.   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  8) Stage IIIA Lung Adenocarcinoma - no targetable mutations. S/p 4 cycles of neoadjuvant Carbo/Pem/Pem Good partial response Seen by Dr Henderickson -- not considered a good surgical candidate due to inadequate kung function for lobectomy or more extensive surgery. S/p consolidative RT dy Dr Palermo--completed on 01/01/2025  PLAN: -patient has completed planned consolidative RT for his lung cancer  - Discussed lab results on 01/01/2025 in detail with patient: CBC/diff is stable hgb 12, WBC and platelets WNL CMP stable, creatinine 1.44 Myeloma panel no M spike SFLC shows normal K/L ratio. Will plan to pursue adjuvant Pembrolizumab  for his lung adenocarcinoma which is higher risk of relapse and being life limiting at this time. His Myeloma  is currently in remission but also at significant risk of relapse and he will need to be back on Carfilzomib  maintenance q2weeks as well. We plan to restart this after PET/CT especially if the lung cancer is in remission. Will need rpt PET/CT in about 8 weeks post RT  FOLLOW-UP Restart adjuvant Pembrolizumab  from 01/26/2025 PET/CT in 6 weeks  The total time spent in the appointment was 40 minutes* .  All of the patient's questions were answered and the patient knows to call the clinic with any problems, questions, or concerns.  Emaline Saran MD MS AAHIVMS [facility] Hematology/Oncology Physician [facility]  *Total Encounter Time as defined by the Centers for Medicare and Medicaid Services includes, in addition to the face-to-face time of a patient visit (documented in the note above) non-face-to-face time: obtaining and reviewing outside history, ordering and reviewing medications, tests or procedures, care coordination (communications with other health care professionals or caregivers) and documentation in the medical record.  I, Alan Blowers, acting as a neurosurgeon for Emaline Saran, MD.,have documented all relevant documentation on the behalf of Emaline Saran, MD,as directed by  Emaline Saran, MD while in the presence of Emaline Saran, MD.  I have reviewed the above documentation for accuracy and completeness, and I agree with the above.  Diannie Willner,  MD     [1]  Social History Tobacco Use   Smoking status: Some Days    Current packs/day: 0.25    Average packs/day: 0.3 packs/day for 0.7 years (0.2 ttl pk-yrs)    Types: Cigarettes    Start date: 04/16/2024    Last attempt to quit: 11/09/2017   Smokeless tobacco: Never   Tobacco comments:    Smokes a cigarette every once in a while starting about 2 weeks ago - 04/23/24  Vaping Use   Vaping status: Never Used  Substance Use Topics   Alcohol use: Not Currently   Drug use: Not Currently   "

## 2025-01-02 LAB — KAPPA/LAMBDA LIGHT CHAINS
Kappa free light chain: 27.6 mg/L — ABNORMAL HIGH (ref 3.3–19.4)
Kappa, lambda light chain ratio: 1.19 (ref 0.26–1.65)
Lambda free light chains: 23.2 mg/L (ref 5.7–26.3)

## 2025-01-03 NOTE — Radiation Completion Notes (Signed)
 Patient Name: Andres Fernandez, Andres Fernandez MRN: [id number] Date of Birth: Apr 14, 1956 Referring Physician: EMALINE SARAN, M.D. Date of Service: 2025-01-03 Radiation Oncologist: Lynwood Cedar, M.D. [facility]                             RADIATION ONCOLOGY END OF TREATMENT NOTE     Diagnosis: C34.31 Malignant neoplasm of lower lobe, right bronchus or lung Intent: Curative     ==========DELIVERED PLANS==========  First Treatment Date: 2024-11-13 Last Treatment Date: 2025-01-01   Plan Name: [id number] Site: Bronchus, Right Technique: 3D Mode: Photon Dose Per Fraction: 2 Gy Prescribed Dose (Delivered / Prescribed): 66 Gy / 66 Gy Prescribed Fxs (Delivered / Prescribed): 33 / 33     ==========ON TREATMENT VISIT DATES========== 2024-11-17, 2024-11-25, 2024-12-02, 2024-12-09, 2024-12-16, 2024-12-23, 2024-12-30     ==========UPCOMING VISITS========== 01/28/2025 [facility] RAD ONC FOLLOW UP 20 Cedar Lynwood, MD        ==========APPENDIX - ON TREATMENT VISIT NOTES==========   See weekly On Treatment Notes in Epic for details in the Media tab (listed as Progress notes on the On Treatment Visit Dates listed above).

## 2025-01-07 LAB — MULTIPLE MYELOMA PANEL, SERUM
Albumin SerPl Elph-Mcnc: 3.2 g/dL (ref 2.9–4.4)
Albumin/Glob SerPl: 0.9 (ref 0.7–1.7)
Alpha 1: 0.4 g/dL (ref 0.0–0.4)
Alpha2 Glob SerPl Elph-Mcnc: 1.3 g/dL — ABNORMAL HIGH (ref 0.4–1.0)
B-Globulin SerPl Elph-Mcnc: 1 g/dL (ref 0.7–1.3)
Gamma Glob SerPl Elph-Mcnc: 0.9 g/dL (ref 0.4–1.8)
Globulin, Total: 3.6 g/dL (ref 2.2–3.9)
IgA: 100 mg/dL (ref 61–437)
IgG (Immunoglobin G), Serum: 1002 mg/dL (ref 603–1613)
IgM (Immunoglobulin M), Srm: 6 mg/dL — ABNORMAL LOW (ref 20–172)
Total Protein ELP: 6.8 g/dL (ref 6.0–8.5)

## 2025-01-10 ENCOUNTER — Encounter: Payer: Self-pay | Admitting: Hematology

## 2025-01-28 ENCOUNTER — Ambulatory Visit: Admitting: Radiation Oncology

## 2025-02-20 ENCOUNTER — Other Ambulatory Visit (HOSPITAL_COMMUNITY)
